# Patient Record
Sex: Female | Born: 1945 | Race: White | Hispanic: No | Marital: Married | Smoking: Never smoker
Health system: Southern US, Community
[De-identification: ages and names within clinical notes are randomized; demographics above are authoritative.]

## PROBLEM LIST (undated history)

## (undated) DIAGNOSIS — Z923 Personal history of irradiation: Secondary | ICD-10-CM

## (undated) DIAGNOSIS — Z853 Personal history of malignant neoplasm of breast: Secondary | ICD-10-CM

## (undated) DIAGNOSIS — M4310 Spondylolisthesis, site unspecified: Secondary | ICD-10-CM

## (undated) DIAGNOSIS — H269 Unspecified cataract: Secondary | ICD-10-CM

## (undated) DIAGNOSIS — R296 Repeated falls: Secondary | ICD-10-CM

## (undated) DIAGNOSIS — E785 Hyperlipidemia, unspecified: Secondary | ICD-10-CM

## (undated) DIAGNOSIS — M8008XS Age-related osteoporosis with current pathological fracture, vertebra(e), sequela: Secondary | ICD-10-CM

## (undated) DIAGNOSIS — N3010 Interstitial cystitis (chronic) without hematuria: Secondary | ICD-10-CM

## (undated) DIAGNOSIS — S0292XA Unspecified fracture of facial bones, initial encounter for closed fracture: Secondary | ICD-10-CM

## (undated) DIAGNOSIS — Z9221 Personal history of antineoplastic chemotherapy: Secondary | ICD-10-CM

## (undated) DIAGNOSIS — D444 Neoplasm of uncertain behavior of craniopharyngeal duct: Secondary | ICD-10-CM

## (undated) DIAGNOSIS — Z8619 Personal history of other infectious and parasitic diseases: Secondary | ICD-10-CM

## (undated) DIAGNOSIS — D496 Neoplasm of unspecified behavior of brain: Secondary | ICD-10-CM

## (undated) DIAGNOSIS — E669 Obesity, unspecified: Secondary | ICD-10-CM

## (undated) DIAGNOSIS — R7309 Other abnormal glucose: Secondary | ICD-10-CM

## (undated) DIAGNOSIS — I428 Other cardiomyopathies: Secondary | ICD-10-CM

## (undated) DIAGNOSIS — N390 Urinary tract infection, site not specified: Secondary | ICD-10-CM

## (undated) DIAGNOSIS — L309 Dermatitis, unspecified: Secondary | ICD-10-CM

## (undated) DIAGNOSIS — Z8719 Personal history of other diseases of the digestive system: Secondary | ICD-10-CM

## (undated) DIAGNOSIS — D649 Anemia, unspecified: Secondary | ICD-10-CM

## (undated) DIAGNOSIS — G5602 Carpal tunnel syndrome, left upper limb: Secondary | ICD-10-CM

## (undated) DIAGNOSIS — C439 Malignant melanoma of skin, unspecified: Secondary | ICD-10-CM

## (undated) DIAGNOSIS — F329 Major depressive disorder, single episode, unspecified: Secondary | ICD-10-CM

## (undated) DIAGNOSIS — I10 Essential (primary) hypertension: Secondary | ICD-10-CM

## (undated) DIAGNOSIS — Z0000 Encounter for general adult medical examination without abnormal findings: Secondary | ICD-10-CM

## (undated) DIAGNOSIS — IMO0002 Reserved for concepts with insufficient information to code with codable children: Secondary | ICD-10-CM

## (undated) DIAGNOSIS — Z9289 Personal history of other medical treatment: Secondary | ICD-10-CM

## (undated) DIAGNOSIS — K5900 Constipation, unspecified: Secondary | ICD-10-CM

## (undated) DIAGNOSIS — E871 Hypo-osmolality and hyponatremia: Secondary | ICD-10-CM

## (undated) DIAGNOSIS — D179 Benign lipomatous neoplasm, unspecified: Secondary | ICD-10-CM

## (undated) DIAGNOSIS — R0602 Shortness of breath: Secondary | ICD-10-CM

## (undated) DIAGNOSIS — K219 Gastro-esophageal reflux disease without esophagitis: Secondary | ICD-10-CM

## (undated) DIAGNOSIS — M1990 Unspecified osteoarthritis, unspecified site: Secondary | ICD-10-CM

## (undated) DIAGNOSIS — G629 Polyneuropathy, unspecified: Secondary | ICD-10-CM

## (undated) DIAGNOSIS — C50919 Malignant neoplasm of unspecified site of unspecified female breast: Secondary | ICD-10-CM

## (undated) HISTORY — DX: Other abnormal glucose: R73.09

## (undated) HISTORY — DX: Interstitial cystitis (chronic) without hematuria: N30.10

## (undated) HISTORY — DX: Reserved for concepts with insufficient information to code with codable children: IMO0002

## (undated) HISTORY — DX: Obesity, unspecified: E66.9

## (undated) HISTORY — PX: OTHER SURGICAL HISTORY: SHX169

## (undated) HISTORY — DX: Hypo-osmolality and hyponatremia: E87.1

## (undated) HISTORY — DX: Major depressive disorder, single episode, unspecified: F32.9

## (undated) HISTORY — DX: Gastro-esophageal reflux disease without esophagitis: K21.9

## (undated) HISTORY — DX: Neoplasm of uncertain behavior of craniopharyngeal duct: D44.4

## (undated) HISTORY — DX: Polyneuropathy, unspecified: G62.9

## (undated) HISTORY — PX: REDUCTION MAMMAPLASTY: SUR839

## (undated) HISTORY — DX: Unspecified fracture of facial bones, initial encounter for closed fracture: S02.92XA

## (undated) HISTORY — DX: Hyperlipidemia, unspecified: E78.5

## (undated) HISTORY — DX: Anemia, unspecified: D64.9

## (undated) HISTORY — DX: Encounter for general adult medical examination without abnormal findings: Z00.00

## (undated) HISTORY — DX: Benign lipomatous neoplasm, unspecified: D17.9

## (undated) HISTORY — DX: Unspecified osteoarthritis, unspecified site: M19.90

## (undated) HISTORY — PX: MELANOMA EXCISION: SHX5266

## (undated) HISTORY — DX: Constipation, unspecified: K59.00

## (undated) HISTORY — DX: Personal history of malignant neoplasm of breast: Z85.3

## (undated) HISTORY — DX: Other cardiomyopathies: I42.8

## (undated) HISTORY — DX: Malignant melanoma of skin, unspecified: C43.9

## (undated) HISTORY — DX: Repeated falls: R29.6

## (undated) HISTORY — DX: Unspecified cataract: H26.9

## (undated) HISTORY — PX: ELBOW SURGERY: SHX618

## (undated) HISTORY — DX: Malignant neoplasm of unspecified site of unspecified female breast: C50.919

## (undated) HISTORY — DX: Spondylolisthesis, site unspecified: M43.10

## (undated) HISTORY — PX: CARPAL TUNNEL RELEASE: SHX101

## (undated) HISTORY — DX: Dermatitis, unspecified: L30.9

## (undated) HISTORY — DX: Urinary tract infection, site not specified: N39.0

## (undated) HISTORY — DX: Age-related osteoporosis with current pathological fracture, vertebra(e), sequela: M80.08XS

## (undated) HISTORY — DX: Personal history of other infectious and parasitic diseases: Z86.19

## (undated) HISTORY — DX: Carpal tunnel syndrome, left upper limb: G56.02

## (undated) HISTORY — DX: Neoplasm of unspecified behavior of brain: D49.6

## (undated) HISTORY — PX: CHOLECYSTECTOMY: SHX55

## (undated) HISTORY — DX: Essential (primary) hypertension: I10

---
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
## 1995-01-31 HISTORY — PX: TUBAL LIGATION: SHX77

## 1998-01-30 DIAGNOSIS — D444 Neoplasm of uncertain behavior of craniopharyngeal duct: Secondary | ICD-10-CM

## 1998-01-30 HISTORY — PX: CRANIOTOMY FOR TUMOR: SUR345

## 1998-01-30 HISTORY — DX: Neoplasm of uncertain behavior of craniopharyngeal duct: D44.4

## 1998-06-17 ENCOUNTER — Ambulatory Visit (HOSPITAL_COMMUNITY): Admission: RE | Admit: 1998-06-17 | Discharge: 1998-06-17 | Payer: Self-pay | Admitting: Ophthalmology

## 1998-06-17 ENCOUNTER — Encounter: Payer: Self-pay | Admitting: Ophthalmology

## 1998-06-21 ENCOUNTER — Encounter: Payer: Self-pay | Admitting: Neurosurgery

## 1998-06-22 ENCOUNTER — Encounter: Payer: Self-pay | Admitting: Neurosurgery

## 1998-06-22 ENCOUNTER — Inpatient Hospital Stay (HOSPITAL_COMMUNITY): Admission: RE | Admit: 1998-06-22 | Discharge: 1998-06-25 | Payer: Self-pay | Admitting: Neurosurgery

## 1998-06-23 ENCOUNTER — Encounter: Payer: Self-pay | Admitting: Neurosurgery

## 1998-06-29 ENCOUNTER — Encounter: Payer: Self-pay | Admitting: Neurosurgery

## 1998-06-29 ENCOUNTER — Ambulatory Visit (HOSPITAL_COMMUNITY): Admission: RE | Admit: 1998-06-29 | Discharge: 1998-06-29 | Payer: Self-pay | Admitting: Neurosurgery

## 1998-08-02 ENCOUNTER — Encounter: Payer: Self-pay | Admitting: Neurosurgery

## 1998-08-02 ENCOUNTER — Ambulatory Visit (HOSPITAL_COMMUNITY): Admission: RE | Admit: 1998-08-02 | Discharge: 1998-08-02 | Payer: Self-pay | Admitting: Neurosurgery

## 1999-02-25 ENCOUNTER — Encounter: Payer: Self-pay | Admitting: Internal Medicine

## 1999-02-25 ENCOUNTER — Ambulatory Visit (HOSPITAL_COMMUNITY): Admission: RE | Admit: 1999-02-25 | Discharge: 1999-02-25 | Payer: Self-pay | Admitting: Internal Medicine

## 1999-04-27 ENCOUNTER — Encounter: Admission: RE | Admit: 1999-04-27 | Discharge: 1999-04-27 | Payer: Self-pay | Admitting: Internal Medicine

## 1999-04-27 ENCOUNTER — Encounter: Payer: Self-pay | Admitting: Internal Medicine

## 1999-10-10 ENCOUNTER — Encounter: Payer: Self-pay | Admitting: Internal Medicine

## 1999-10-10 ENCOUNTER — Ambulatory Visit (HOSPITAL_COMMUNITY): Admission: RE | Admit: 1999-10-10 | Discharge: 1999-10-10 | Payer: Self-pay | Admitting: Internal Medicine

## 1999-11-07 ENCOUNTER — Encounter: Admission: RE | Admit: 1999-11-07 | Discharge: 1999-11-07 | Payer: Self-pay | Admitting: Specialist

## 1999-11-07 ENCOUNTER — Encounter: Payer: Self-pay | Admitting: Specialist

## 2000-02-20 ENCOUNTER — Encounter: Payer: Self-pay | Admitting: Neurosurgery

## 2000-02-20 ENCOUNTER — Ambulatory Visit (HOSPITAL_COMMUNITY): Admission: RE | Admit: 2000-02-20 | Discharge: 2000-02-20 | Payer: Self-pay | Admitting: Neurosurgery

## 2000-03-05 ENCOUNTER — Other Ambulatory Visit: Admission: RE | Admit: 2000-03-05 | Discharge: 2000-03-05 | Payer: Self-pay | Admitting: Obstetrics and Gynecology

## 2000-05-02 ENCOUNTER — Encounter: Payer: Self-pay | Admitting: Obstetrics and Gynecology

## 2000-05-02 ENCOUNTER — Encounter: Admission: RE | Admit: 2000-05-02 | Discharge: 2000-05-02 | Payer: Self-pay | Admitting: Obstetrics and Gynecology

## 2001-01-30 ENCOUNTER — Encounter: Payer: Self-pay | Admitting: *Deleted

## 2001-02-20 ENCOUNTER — Other Ambulatory Visit: Admission: RE | Admit: 2001-02-20 | Discharge: 2001-02-20 | Payer: Self-pay | Admitting: Obstetrics & Gynecology

## 2001-05-06 ENCOUNTER — Encounter: Admission: RE | Admit: 2001-05-06 | Discharge: 2001-05-06 | Payer: Self-pay | Admitting: Obstetrics & Gynecology

## 2001-05-06 ENCOUNTER — Encounter: Payer: Self-pay | Admitting: Obstetrics & Gynecology

## 2002-05-12 ENCOUNTER — Encounter: Payer: Self-pay | Admitting: Family Medicine

## 2002-05-12 ENCOUNTER — Encounter: Admission: RE | Admit: 2002-05-12 | Discharge: 2002-05-12 | Payer: Self-pay | Admitting: Family Medicine

## 2002-05-29 ENCOUNTER — Ambulatory Visit (HOSPITAL_COMMUNITY): Admission: RE | Admit: 2002-05-29 | Discharge: 2002-05-29 | Payer: Self-pay | Admitting: Gastroenterology

## 2003-05-20 ENCOUNTER — Encounter: Admission: RE | Admit: 2003-05-20 | Discharge: 2003-05-20 | Payer: Self-pay | Admitting: Family Medicine

## 2003-06-26 ENCOUNTER — Ambulatory Visit (HOSPITAL_COMMUNITY): Admission: RE | Admit: 2003-06-26 | Discharge: 2003-06-26 | Payer: Self-pay | Admitting: Gastroenterology

## 2003-09-28 ENCOUNTER — Ambulatory Visit (HOSPITAL_COMMUNITY): Admission: RE | Admit: 2003-09-28 | Discharge: 2003-09-28 | Payer: Self-pay | Admitting: Gastroenterology

## 2004-07-12 ENCOUNTER — Encounter: Admission: RE | Admit: 2004-07-12 | Discharge: 2004-07-12 | Payer: Self-pay | Admitting: Family Medicine

## 2005-03-07 ENCOUNTER — Encounter: Admission: RE | Admit: 2005-03-07 | Discharge: 2005-03-07 | Payer: Self-pay | Admitting: Family Medicine

## 2005-08-01 ENCOUNTER — Encounter: Admission: RE | Admit: 2005-08-01 | Discharge: 2005-08-01 | Payer: Self-pay | Admitting: Family Medicine

## 2006-01-30 DIAGNOSIS — C439 Malignant melanoma of skin, unspecified: Secondary | ICD-10-CM

## 2006-01-30 HISTORY — DX: Malignant melanoma of skin, unspecified: C43.9

## 2006-06-20 ENCOUNTER — Other Ambulatory Visit: Admission: RE | Admit: 2006-06-20 | Discharge: 2006-06-20 | Payer: Self-pay | Admitting: Obstetrics and Gynecology

## 2006-08-07 ENCOUNTER — Encounter: Admission: RE | Admit: 2006-08-07 | Discharge: 2006-08-07 | Payer: Self-pay | Admitting: Gynecology

## 2007-07-24 ENCOUNTER — Other Ambulatory Visit: Admission: RE | Admit: 2007-07-24 | Discharge: 2007-07-24 | Payer: Self-pay | Admitting: Obstetrics and Gynecology

## 2007-08-13 ENCOUNTER — Encounter: Admission: RE | Admit: 2007-08-13 | Discharge: 2007-08-13 | Payer: Self-pay | Admitting: Family Medicine

## 2007-09-12 ENCOUNTER — Encounter: Payer: Self-pay | Admitting: Family Medicine

## 2008-08-11 ENCOUNTER — Ambulatory Visit: Payer: Self-pay | Admitting: Family Medicine

## 2008-08-11 ENCOUNTER — Encounter: Payer: Self-pay | Admitting: *Deleted

## 2008-08-11 DIAGNOSIS — F3289 Other specified depressive episodes: Secondary | ICD-10-CM

## 2008-08-11 DIAGNOSIS — K219 Gastro-esophageal reflux disease without esophagitis: Secondary | ICD-10-CM | POA: Insufficient documentation

## 2008-08-11 DIAGNOSIS — F329 Major depressive disorder, single episode, unspecified: Secondary | ICD-10-CM

## 2008-08-11 DIAGNOSIS — I10 Essential (primary) hypertension: Secondary | ICD-10-CM

## 2008-08-11 DIAGNOSIS — E785 Hyperlipidemia, unspecified: Secondary | ICD-10-CM

## 2008-08-11 DIAGNOSIS — E782 Mixed hyperlipidemia: Secondary | ICD-10-CM

## 2008-08-11 DIAGNOSIS — F339 Major depressive disorder, recurrent, unspecified: Secondary | ICD-10-CM

## 2008-08-11 DIAGNOSIS — F418 Other specified anxiety disorders: Secondary | ICD-10-CM | POA: Insufficient documentation

## 2008-08-11 HISTORY — DX: Major depressive disorder, recurrent, unspecified: F33.9

## 2008-08-11 HISTORY — DX: Mixed hyperlipidemia: E78.2

## 2008-08-11 HISTORY — DX: Gastro-esophageal reflux disease without esophagitis: K21.9

## 2008-08-11 HISTORY — DX: Major depressive disorder, single episode, unspecified: F32.9

## 2008-08-11 HISTORY — DX: Essential (primary) hypertension: I10

## 2008-08-11 HISTORY — DX: Hyperlipidemia, unspecified: E78.5

## 2008-08-11 HISTORY — DX: Other specified depressive episodes: F32.89

## 2008-08-12 LAB — CONVERTED CEMR LAB
ALT: 28 units/L (ref 0–35)
Albumin: 3.8 g/dL (ref 3.5–5.2)
Bilirubin, Direct: 0.1 mg/dL (ref 0.0–0.3)
Direct LDL: 131.1 mg/dL
Glucose, Bld: 99 mg/dL (ref 70–99)
Sodium: 143 meq/L (ref 135–145)
Total CHOL/HDL Ratio: 4

## 2008-08-18 ENCOUNTER — Encounter: Admission: RE | Admit: 2008-08-18 | Discharge: 2008-08-18 | Payer: Self-pay | Admitting: Family Medicine

## 2008-08-27 ENCOUNTER — Encounter: Payer: Self-pay | Admitting: Women's Health

## 2008-08-27 ENCOUNTER — Other Ambulatory Visit: Admission: RE | Admit: 2008-08-27 | Discharge: 2008-08-27 | Payer: Self-pay | Admitting: Obstetrics and Gynecology

## 2008-08-27 ENCOUNTER — Ambulatory Visit: Payer: Self-pay | Admitting: Women's Health

## 2008-11-30 ENCOUNTER — Encounter (INDEPENDENT_AMBULATORY_CARE_PROVIDER_SITE_OTHER): Payer: Self-pay | Admitting: *Deleted

## 2008-12-15 ENCOUNTER — Encounter (INDEPENDENT_AMBULATORY_CARE_PROVIDER_SITE_OTHER): Payer: Self-pay | Admitting: *Deleted

## 2009-01-30 DIAGNOSIS — C50311 Malignant neoplasm of lower-inner quadrant of right female breast: Secondary | ICD-10-CM

## 2009-01-30 HISTORY — DX: Malignant neoplasm of lower-inner quadrant of right female breast: C50.311

## 2009-02-10 ENCOUNTER — Ambulatory Visit: Payer: Self-pay | Admitting: Family Medicine

## 2009-02-10 DIAGNOSIS — R0609 Other forms of dyspnea: Secondary | ICD-10-CM | POA: Insufficient documentation

## 2009-02-10 DIAGNOSIS — D179 Benign lipomatous neoplasm, unspecified: Secondary | ICD-10-CM | POA: Insufficient documentation

## 2009-02-10 HISTORY — DX: Benign lipomatous neoplasm, unspecified: D17.9

## 2009-02-10 HISTORY — DX: Other forms of dyspnea: R06.09

## 2009-03-04 ENCOUNTER — Telehealth: Payer: Self-pay | Admitting: Family Medicine

## 2009-03-24 ENCOUNTER — Ambulatory Visit (HOSPITAL_BASED_OUTPATIENT_CLINIC_OR_DEPARTMENT_OTHER): Admission: RE | Admit: 2009-03-24 | Discharge: 2009-03-24 | Payer: Self-pay | Admitting: Surgery

## 2009-03-25 DIAGNOSIS — Z853 Personal history of malignant neoplasm of breast: Secondary | ICD-10-CM | POA: Insufficient documentation

## 2009-03-28 HISTORY — PX: LIPOMA EXCISION: SHX5283

## 2009-04-30 DIAGNOSIS — Z853 Personal history of malignant neoplasm of breast: Secondary | ICD-10-CM

## 2009-04-30 HISTORY — DX: Personal history of malignant neoplasm of breast: Z85.3

## 2009-04-30 HISTORY — PX: BREAST LUMPECTOMY: SHX2

## 2009-05-03 ENCOUNTER — Encounter (INDEPENDENT_AMBULATORY_CARE_PROVIDER_SITE_OTHER): Payer: Self-pay

## 2009-05-04 ENCOUNTER — Ambulatory Visit: Payer: Self-pay | Admitting: Gastroenterology

## 2009-05-04 ENCOUNTER — Encounter: Payer: Self-pay | Admitting: Family Medicine

## 2009-05-04 ENCOUNTER — Telehealth: Payer: Self-pay | Admitting: Gastroenterology

## 2009-05-06 ENCOUNTER — Encounter: Admission: RE | Admit: 2009-05-06 | Discharge: 2009-05-06 | Payer: Self-pay | Admitting: Surgery

## 2009-05-10 ENCOUNTER — Encounter: Payer: Self-pay | Admitting: Family Medicine

## 2009-05-11 ENCOUNTER — Encounter: Admission: RE | Admit: 2009-05-11 | Discharge: 2009-05-11 | Payer: Self-pay | Admitting: Surgery

## 2009-05-13 ENCOUNTER — Ambulatory Visit: Payer: Self-pay | Admitting: Oncology

## 2009-05-17 LAB — CBC WITH DIFFERENTIAL/PLATELET
BASO%: 0.5 % (ref 0.0–2.0)
EOS%: 4.4 % (ref 0.0–7.0)
HCT: 35.5 % (ref 34.8–46.6)
MCH: 28.6 pg (ref 25.1–34.0)
MCHC: 33.6 g/dL (ref 31.5–36.0)
NEUT%: 55.6 % (ref 38.4–76.8)
RDW: 14.9 % — ABNORMAL HIGH (ref 11.2–14.5)
lymph#: 2.1 10*3/uL (ref 0.9–3.3)

## 2009-05-17 LAB — COMPREHENSIVE METABOLIC PANEL
ALT: 44 U/L — ABNORMAL HIGH (ref 0–35)
AST: 38 U/L — ABNORMAL HIGH (ref 0–37)
Calcium: 9.6 mg/dL (ref 8.4–10.5)
Chloride: 106 mEq/L (ref 96–112)
Creatinine, Ser: 0.73 mg/dL (ref 0.40–1.20)
Total Bilirubin: 0.6 mg/dL (ref 0.3–1.2)

## 2009-05-18 ENCOUNTER — Ambulatory Visit: Payer: Self-pay | Admitting: Gastroenterology

## 2009-05-19 ENCOUNTER — Ambulatory Visit: Payer: Self-pay | Admitting: Cardiology

## 2009-05-19 ENCOUNTER — Ambulatory Visit: Admission: RE | Admit: 2009-05-19 | Discharge: 2009-05-19 | Payer: Self-pay | Admitting: Oncology

## 2009-05-19 ENCOUNTER — Encounter: Payer: Self-pay | Admitting: Oncology

## 2009-05-20 ENCOUNTER — Ambulatory Visit (HOSPITAL_BASED_OUTPATIENT_CLINIC_OR_DEPARTMENT_OTHER): Admission: RE | Admit: 2009-05-20 | Discharge: 2009-05-20 | Payer: Self-pay | Admitting: Orthopedic Surgery

## 2009-05-24 ENCOUNTER — Telehealth: Payer: Self-pay | Admitting: Family Medicine

## 2009-05-24 ENCOUNTER — Ambulatory Visit (HOSPITAL_COMMUNITY): Admission: RE | Admit: 2009-05-24 | Discharge: 2009-05-24 | Payer: Self-pay | Admitting: Oncology

## 2009-05-26 ENCOUNTER — Encounter: Payer: Self-pay | Admitting: Family Medicine

## 2009-05-30 HISTORY — PX: PORTACATH PLACEMENT: SHX2246

## 2009-05-31 ENCOUNTER — Ambulatory Visit (HOSPITAL_COMMUNITY): Admission: RE | Admit: 2009-05-31 | Discharge: 2009-05-31 | Payer: Self-pay | Admitting: Surgery

## 2009-05-31 ENCOUNTER — Ambulatory Visit (HOSPITAL_BASED_OUTPATIENT_CLINIC_OR_DEPARTMENT_OTHER): Admission: RE | Admit: 2009-05-31 | Discharge: 2009-05-31 | Payer: Self-pay | Admitting: Surgery

## 2009-06-01 ENCOUNTER — Ambulatory Visit: Payer: Self-pay | Admitting: Oncology

## 2009-06-01 ENCOUNTER — Encounter: Payer: Self-pay | Admitting: Family Medicine

## 2009-06-07 LAB — CBC WITH DIFFERENTIAL/PLATELET
Basophils Absolute: 0 10*3/uL (ref 0.0–0.1)
Eosinophils Absolute: 0.2 10*3/uL (ref 0.0–0.5)
HCT: 35.4 % (ref 34.8–46.6)
HGB: 11.9 g/dL (ref 11.6–15.9)
LYMPH%: 27.7 % (ref 14.0–49.7)
MCV: 84.7 fL (ref 79.5–101.0)
MONO#: 0.5 10*3/uL (ref 0.1–0.9)
MONO%: 15.4 % — ABNORMAL HIGH (ref 0.0–14.0)
NEUT#: 1.6 10*3/uL (ref 1.5–6.5)
NEUT%: 51.1 % (ref 38.4–76.8)
Platelets: 142 10*3/uL — ABNORMAL LOW (ref 145–400)
WBC: 3 10*3/uL — ABNORMAL LOW (ref 3.9–10.3)

## 2009-06-09 ENCOUNTER — Ambulatory Visit: Payer: Self-pay | Admitting: Family Medicine

## 2009-06-09 LAB — CONVERTED CEMR LAB
AST: 44 units/L — ABNORMAL HIGH (ref 0–37)
BUN: 12 mg/dL (ref 6–23)
Calcium: 9.6 mg/dL (ref 8.4–10.5)
Chloride: 101 meq/L (ref 96–112)
Creatinine, Ser: 1.1 mg/dL (ref 0.4–1.2)
GFR calc non Af Amer: 55.9 mL/min (ref >60)
Glucose, Bld: 121 mg/dL — ABNORMAL HIGH (ref 70–99)
HDL: 38.6 mg/dL — ABNORMAL LOW (ref >39.00)
LDL Cholesterol: 62 mg/dL (ref 0–99)
Sodium: 140 meq/L (ref 135–145)
Total CHOL/HDL Ratio: 3
Triglycerides: 164 mg/dL — ABNORMAL HIGH (ref 0.0–149.0)

## 2009-06-14 ENCOUNTER — Encounter: Payer: Self-pay | Admitting: Family Medicine

## 2009-06-14 LAB — CBC WITH DIFFERENTIAL/PLATELET
BASO%: 0.3 % (ref 0.0–2.0)
Basophils Absolute: 0 10*3/uL (ref 0.0–0.1)
HCT: 31.5 % — ABNORMAL LOW (ref 34.8–46.6)
LYMPH%: 16.5 % (ref 14.0–49.7)
MCHC: 33.9 g/dL (ref 31.5–36.0)
MCV: 83.4 fL (ref 79.5–101.0)
MONO#: 0.4 10*3/uL (ref 0.1–0.9)
MONO%: 5 % (ref 0.0–14.0)
NEUT%: 76.6 % (ref 38.4–76.8)
Platelets: 138 10*3/uL — ABNORMAL LOW (ref 145–400)
RBC: 3.77 10*6/uL (ref 3.70–5.45)
WBC: 7.8 10*3/uL (ref 3.9–10.3)

## 2009-06-14 LAB — COMPREHENSIVE METABOLIC PANEL
ALT: 50 U/L — ABNORMAL HIGH (ref 0–35)
Alkaline Phosphatase: 149 U/L — ABNORMAL HIGH (ref 39–117)
CO2: 22 mEq/L (ref 19–32)
Creatinine, Ser: 0.82 mg/dL (ref 0.40–1.20)
Glucose, Bld: 106 mg/dL — ABNORMAL HIGH (ref 70–99)
Total Bilirubin: 0.4 mg/dL (ref 0.3–1.2)

## 2009-06-15 ENCOUNTER — Ambulatory Visit: Payer: Self-pay | Admitting: Family Medicine

## 2009-06-15 DIAGNOSIS — R7309 Other abnormal glucose: Secondary | ICD-10-CM

## 2009-06-15 DIAGNOSIS — R7303 Prediabetes: Secondary | ICD-10-CM

## 2009-06-15 HISTORY — DX: Other abnormal glucose: R73.09

## 2009-06-15 HISTORY — DX: Prediabetes: R73.03

## 2009-06-15 LAB — CONVERTED CEMR LAB
HDL goal, serum: 40 mg/dL
LDL Goal: 130 mg/dL

## 2009-06-22 ENCOUNTER — Encounter: Payer: Self-pay | Admitting: Family Medicine

## 2009-06-22 LAB — CBC WITH DIFFERENTIAL/PLATELET
Eosinophils Absolute: 0 10*3/uL (ref 0.0–0.5)
HCT: 29.4 % — ABNORMAL LOW (ref 34.8–46.6)
LYMPH%: 7.4 % — ABNORMAL LOW (ref 14.0–49.7)
MCV: 85.5 fL (ref 79.5–101.0)
MONO#: 0.4 10*3/uL (ref 0.1–0.9)
NEUT#: 13.5 10*3/uL — ABNORMAL HIGH (ref 1.5–6.5)
NEUT%: 90 % — ABNORMAL HIGH (ref 38.4–76.8)
Platelets: 415 10*3/uL — ABNORMAL HIGH (ref 145–400)
WBC: 15 10*3/uL — ABNORMAL HIGH (ref 3.9–10.3)

## 2009-06-25 ENCOUNTER — Encounter: Payer: Self-pay | Admitting: Family Medicine

## 2009-06-29 ENCOUNTER — Encounter: Payer: Self-pay | Admitting: Family Medicine

## 2009-06-29 LAB — CBC WITH DIFFERENTIAL/PLATELET
BASO%: 0.5 % (ref 0.0–2.0)
EOS%: 8.1 % — ABNORMAL HIGH (ref 0.0–7.0)
HCT: 33.6 % — ABNORMAL LOW (ref 34.8–46.6)
LYMPH%: 21.9 % (ref 14.0–49.7)
MCH: 27.5 pg (ref 25.1–34.0)
MCHC: 32.4 g/dL (ref 31.5–36.0)
MCV: 84.8 fL (ref 79.5–101.0)
MONO%: 24.6 % — ABNORMAL HIGH (ref 0.0–14.0)
NEUT%: 44.9 % (ref 38.4–76.8)
Platelets: 185 10*3/uL (ref 145–400)

## 2009-07-02 ENCOUNTER — Ambulatory Visit: Payer: Self-pay | Admitting: Oncology

## 2009-07-06 ENCOUNTER — Encounter: Payer: Self-pay | Admitting: Family Medicine

## 2009-07-06 LAB — CBC WITH DIFFERENTIAL/PLATELET
BASO%: 1 % (ref 0.0–2.0)
Basophils Absolute: 0 10*3/uL (ref 0.0–0.1)
EOS%: 0.7 % (ref 0.0–7.0)
HGB: 9.5 g/dL — ABNORMAL LOW (ref 11.6–15.9)
MCH: 29.4 pg (ref 25.1–34.0)
MCHC: 34.7 g/dL (ref 31.5–36.0)
MCV: 84.8 fL (ref 79.5–101.0)
MONO%: 6.6 % (ref 0.0–14.0)
NEUT%: 68.4 % (ref 38.4–76.8)
RDW: 16.5 % — ABNORMAL HIGH (ref 11.2–14.5)
lymph#: 1.2 10*3/uL (ref 0.9–3.3)

## 2009-07-06 LAB — COMPREHENSIVE METABOLIC PANEL
ALT: 39 U/L — ABNORMAL HIGH (ref 0–35)
AST: 31 U/L (ref 0–37)
Alkaline Phosphatase: 103 U/L (ref 39–117)
BUN: 9 mg/dL (ref 6–23)
Calcium: 8.8 mg/dL (ref 8.4–10.5)
Chloride: 107 mEq/L (ref 96–112)
Creatinine, Ser: 0.74 mg/dL (ref 0.40–1.20)
Potassium: 3.8 mEq/L (ref 3.5–5.3)

## 2009-07-13 ENCOUNTER — Encounter: Payer: Self-pay | Admitting: Family Medicine

## 2009-07-13 LAB — CBC WITH DIFFERENTIAL/PLATELET
BASO%: 1 % (ref 0.0–2.0)
Basophils Absolute: 0.1 10*3/uL (ref 0.0–0.1)
EOS%: 2.1 % (ref 0.0–7.0)
HCT: 29 % — ABNORMAL LOW (ref 34.8–46.6)
HGB: 9.2 g/dL — ABNORMAL LOW (ref 11.6–15.9)
LYMPH%: 24.3 % (ref 14.0–49.7)
MCH: 28.4 pg (ref 25.1–34.0)
MCHC: 31.7 g/dL (ref 31.5–36.0)
MCV: 89.5 fL (ref 79.5–101.0)
MONO%: 9.9 % (ref 0.0–14.0)
NEUT%: 62.7 % (ref 38.4–76.8)

## 2009-07-13 LAB — COMPREHENSIVE METABOLIC PANEL
AST: 38 U/L — ABNORMAL HIGH (ref 0–37)
Albumin: 4 g/dL (ref 3.5–5.2)
Alkaline Phosphatase: 91 U/L (ref 39–117)
BUN: 8 mg/dL (ref 6–23)
Creatinine, Ser: 0.77 mg/dL (ref 0.40–1.20)
Glucose, Bld: 87 mg/dL (ref 70–99)
Total Bilirubin: 0.3 mg/dL (ref 0.3–1.2)

## 2009-07-20 ENCOUNTER — Encounter: Payer: Self-pay | Admitting: Family Medicine

## 2009-07-20 LAB — COMPREHENSIVE METABOLIC PANEL
AST: 27 U/L (ref 0–37)
Albumin: 4.2 g/dL (ref 3.5–5.2)
BUN: 13 mg/dL (ref 6–23)
CO2: 22 mEq/L (ref 19–32)
Calcium: 9.2 mg/dL (ref 8.4–10.5)
Chloride: 107 mEq/L (ref 96–112)
Glucose, Bld: 104 mg/dL — ABNORMAL HIGH (ref 70–99)
Potassium: 4 mEq/L (ref 3.5–5.3)

## 2009-07-20 LAB — CBC WITH DIFFERENTIAL/PLATELET
BASO%: 0.4 % (ref 0.0–2.0)
Basophils Absolute: 0 10*3/uL (ref 0.0–0.1)
EOS%: 4.1 % (ref 0.0–7.0)
HGB: 9.4 g/dL — ABNORMAL LOW (ref 11.6–15.9)
MCH: 29.9 pg (ref 25.1–34.0)
MCHC: 34 g/dL (ref 31.5–36.0)
MONO#: 0.4 10*3/uL (ref 0.1–0.9)
RDW: 21.7 % — ABNORMAL HIGH (ref 11.2–14.5)
WBC: 6.5 10*3/uL (ref 3.9–10.3)
lymph#: 1.6 10*3/uL (ref 0.9–3.3)

## 2009-07-27 ENCOUNTER — Encounter: Payer: Self-pay | Admitting: Family Medicine

## 2009-07-27 LAB — COMPREHENSIVE METABOLIC PANEL
Albumin: 4.1 g/dL (ref 3.5–5.2)
BUN: 10 mg/dL (ref 6–23)
CO2: 21 mEq/L (ref 19–32)
Calcium: 8.8 mg/dL (ref 8.4–10.5)
Chloride: 104 mEq/L (ref 96–112)
Glucose, Bld: 106 mg/dL — ABNORMAL HIGH (ref 70–99)
Potassium: 4 mEq/L (ref 3.5–5.3)

## 2009-07-27 LAB — CBC WITH DIFFERENTIAL/PLATELET
Basophils Absolute: 0 10*3/uL (ref 0.0–0.1)
Eosinophils Absolute: 0.2 10*3/uL (ref 0.0–0.5)
HGB: 10.2 g/dL — ABNORMAL LOW (ref 11.6–15.9)
NEUT#: 3.2 10*3/uL (ref 1.5–6.5)
RDW: 20.3 % — ABNORMAL HIGH (ref 11.2–14.5)
lymph#: 1.4 10*3/uL (ref 0.9–3.3)

## 2009-08-03 ENCOUNTER — Ambulatory Visit: Payer: Self-pay | Admitting: Oncology

## 2009-08-03 ENCOUNTER — Encounter: Payer: Self-pay | Admitting: Family Medicine

## 2009-08-03 LAB — CBC WITH DIFFERENTIAL/PLATELET
BASO%: 0.7 % (ref 0.0–2.0)
MCHC: 32.3 g/dL (ref 31.5–36.0)
MONO#: 0.4 10*3/uL (ref 0.1–0.9)
RBC: 3.51 10*6/uL — ABNORMAL LOW (ref 3.70–5.45)
RDW: 20.1 % — ABNORMAL HIGH (ref 11.2–14.5)
WBC: 5.9 10*3/uL (ref 3.9–10.3)
lymph#: 1.3 10*3/uL (ref 0.9–3.3)

## 2009-08-03 LAB — COMPREHENSIVE METABOLIC PANEL
ALT: 40 U/L — ABNORMAL HIGH (ref 0–35)
CO2: 20 mEq/L (ref 19–32)
Calcium: 9.1 mg/dL (ref 8.4–10.5)
Chloride: 101 mEq/L (ref 96–112)
Sodium: 136 mEq/L (ref 135–145)
Total Protein: 6.6 g/dL (ref 6.0–8.3)

## 2009-08-10 ENCOUNTER — Encounter: Payer: Self-pay | Admitting: Family Medicine

## 2009-08-10 LAB — CBC WITH DIFFERENTIAL/PLATELET
Basophils Absolute: 0 10*3/uL (ref 0.0–0.1)
Eosinophils Absolute: 0.3 10*3/uL (ref 0.0–0.5)
HCT: 28.8 % — ABNORMAL LOW (ref 34.8–46.6)
LYMPH%: 26.8 % (ref 14.0–49.7)
MCV: 93.5 fL (ref 79.5–101.0)
MONO#: 0.6 10*3/uL (ref 0.1–0.9)
MONO%: 11.5 % (ref 0.0–14.0)
NEUT#: 2.8 10*3/uL (ref 1.5–6.5)
NEUT%: 55.7 % (ref 38.4–76.8)
Platelets: 214 10*3/uL (ref 145–400)
RBC: 3.08 10*6/uL — ABNORMAL LOW (ref 3.70–5.45)
nRBC: 0 % (ref 0–0)

## 2009-08-10 LAB — COMPREHENSIVE METABOLIC PANEL
Alkaline Phosphatase: 91 U/L (ref 39–117)
Creatinine, Ser: 0.78 mg/dL (ref 0.40–1.20)
Glucose, Bld: 93 mg/dL (ref 70–99)
Sodium: 144 mEq/L (ref 135–145)
Total Bilirubin: 0.3 mg/dL (ref 0.3–1.2)
Total Protein: 6.1 g/dL (ref 6.0–8.3)

## 2009-08-12 ENCOUNTER — Ambulatory Visit: Payer: Self-pay | Admitting: Surgery

## 2009-08-12 ENCOUNTER — Encounter: Payer: Self-pay | Admitting: Oncology

## 2009-08-12 ENCOUNTER — Ambulatory Visit: Admission: RE | Admit: 2009-08-12 | Discharge: 2009-08-12 | Payer: Self-pay | Admitting: Oncology

## 2009-08-12 ENCOUNTER — Ambulatory Visit
Admission: RE | Admit: 2009-08-12 | Discharge: 2009-08-12 | Payer: Self-pay | Source: Home / Self Care | Admitting: Oncology

## 2009-08-16 ENCOUNTER — Encounter: Payer: Self-pay | Admitting: Family Medicine

## 2009-08-16 LAB — CBC WITH DIFFERENTIAL/PLATELET
BASO%: 0.7 % (ref 0.0–2.0)
Basophils Absolute: 0 10*3/uL (ref 0.0–0.1)
EOS%: 5.4 % (ref 0.0–7.0)
Eosinophils Absolute: 0.3 10*3/uL (ref 0.0–0.5)
HCT: 31.6 % — ABNORMAL LOW (ref 34.8–46.6)
HGB: 10 g/dL — ABNORMAL LOW (ref 11.6–15.9)
LYMPH%: 26.7 % (ref 14.0–49.7)
MCH: 29.8 pg (ref 25.1–34.0)
MCHC: 31.6 g/dL (ref 31.5–36.0)
MCV: 94 fL (ref 79.5–101.0)
MONO#: 0.5 10*3/uL (ref 0.1–0.9)
MONO%: 8.6 % (ref 0.0–14.0)
NEUT#: 3.6 10*3/uL (ref 1.5–6.5)
NEUT%: 58.6 % (ref 38.4–76.8)
Platelets: 181 10*3/uL (ref 145–400)
RBC: 3.36 10*6/uL — ABNORMAL LOW (ref 3.70–5.45)
RDW: 19.5 % — ABNORMAL HIGH (ref 11.2–14.5)
WBC: 6.1 10*3/uL (ref 3.9–10.3)
lymph#: 1.6 10*3/uL (ref 0.9–3.3)

## 2009-08-23 ENCOUNTER — Encounter: Payer: Self-pay | Admitting: Family Medicine

## 2009-09-01 ENCOUNTER — Encounter: Payer: Self-pay | Admitting: Family Medicine

## 2009-09-01 LAB — CBC WITH DIFFERENTIAL/PLATELET
Basophils Absolute: 0.1 10*3/uL (ref 0.0–0.1)
EOS%: 0.4 % (ref 0.0–7.0)
Eosinophils Absolute: 0 10*3/uL (ref 0.0–0.5)
HCT: 27.8 % — ABNORMAL LOW (ref 34.8–46.6)
HGB: 8.8 g/dL — ABNORMAL LOW (ref 11.6–15.9)
MCH: 30.2 pg (ref 25.1–34.0)
MCV: 95.5 fL (ref 79.5–101.0)
MONO%: 15.3 % — ABNORMAL HIGH (ref 0.0–14.0)
NEUT#: 4.9 10*3/uL (ref 1.5–6.5)
NEUT%: 64.7 % (ref 38.4–76.8)
RDW: 16.9 % — ABNORMAL HIGH (ref 11.2–14.5)
lymph#: 1.4 10*3/uL (ref 0.9–3.3)

## 2009-09-03 ENCOUNTER — Ambulatory Visit: Payer: Self-pay | Admitting: Oncology

## 2009-09-07 ENCOUNTER — Encounter: Payer: Self-pay | Admitting: Family Medicine

## 2009-09-07 LAB — CBC WITH DIFFERENTIAL/PLATELET
Eosinophils Absolute: 0 10*3/uL (ref 0.0–0.5)
HCT: 26 % — ABNORMAL LOW (ref 34.8–46.6)
LYMPH%: 16.9 % (ref 14.0–49.7)
MCV: 93.6 fL (ref 79.5–101.0)
MONO#: 0 10*3/uL — ABNORMAL LOW (ref 0.1–0.9)
MONO%: 0.4 % (ref 0.0–14.0)
NEUT#: 2.6 10*3/uL (ref 1.5–6.5)
NEUT%: 82.3 % — ABNORMAL HIGH (ref 38.4–76.8)
Platelets: 252 10*3/uL (ref 145–400)
RBC: 2.78 10*6/uL — ABNORMAL LOW (ref 3.70–5.45)
WBC: 3.1 10*3/uL — ABNORMAL LOW (ref 3.9–10.3)

## 2009-09-07 LAB — HOLD TUBE, BLOOD BANK

## 2009-09-10 ENCOUNTER — Ambulatory Visit (HOSPITAL_COMMUNITY): Admission: RE | Admit: 2009-09-10 | Discharge: 2009-09-10 | Payer: Self-pay | Admitting: Oncology

## 2009-09-10 ENCOUNTER — Encounter: Payer: Self-pay | Admitting: Family Medicine

## 2009-09-10 LAB — CBC WITH DIFFERENTIAL/PLATELET
BASO%: 8 % — ABNORMAL HIGH (ref 0.0–2.0)
Basophils Absolute: 0 10*3/uL (ref 0.0–0.1)
EOS%: 0 % (ref 0.0–7.0)
HCT: 24.5 % — ABNORMAL LOW (ref 34.8–46.6)
HGB: 7.9 g/dL — ABNORMAL LOW (ref 11.6–15.9)
MCH: 30 pg (ref 25.1–34.0)
MONO#: 0.1 10*3/uL (ref 0.1–0.9)
NEUT%: 6 % — ABNORMAL LOW (ref 38.4–76.8)
RDW: 16.2 % — ABNORMAL HIGH (ref 11.2–14.5)
WBC: 0.5 10*3/uL — CL (ref 3.9–10.3)
lymph#: 0.3 10*3/uL — ABNORMAL LOW (ref 0.9–3.3)

## 2009-09-14 ENCOUNTER — Encounter: Payer: Self-pay | Admitting: Family Medicine

## 2009-09-14 LAB — CBC WITH DIFFERENTIAL/PLATELET
Basophils Absolute: 0.1 10*3/uL (ref 0.0–0.1)
Eosinophils Absolute: 0 10*3/uL (ref 0.0–0.5)
HGB: 10.6 g/dL — ABNORMAL LOW (ref 11.6–15.9)
MONO#: 1.9 10*3/uL — ABNORMAL HIGH (ref 0.1–0.9)
NEUT#: 25.7 10*3/uL — ABNORMAL HIGH (ref 1.5–6.5)
Platelets: 202 10*3/uL (ref 145–400)
RBC: 3.46 10*6/uL — ABNORMAL LOW (ref 3.70–5.45)
RDW: 16.9 % — ABNORMAL HIGH (ref 11.2–14.5)
WBC: 29.2 10*3/uL — ABNORMAL HIGH (ref 3.9–10.3)

## 2009-09-16 ENCOUNTER — Encounter: Admission: RE | Admit: 2009-09-16 | Discharge: 2009-09-16 | Payer: Self-pay | Admitting: Oncology

## 2009-09-20 ENCOUNTER — Ambulatory Visit: Payer: Self-pay | Admitting: Genetic Counselor

## 2009-09-21 ENCOUNTER — Encounter: Payer: Self-pay | Admitting: Family Medicine

## 2009-09-21 LAB — COMPREHENSIVE METABOLIC PANEL
BUN: 15 mg/dL (ref 6–23)
CO2: 28 mEq/L (ref 19–32)
Creatinine, Ser: 0.81 mg/dL (ref 0.40–1.20)
Glucose, Bld: 92 mg/dL (ref 70–99)
Total Bilirubin: 0.2 mg/dL — ABNORMAL LOW (ref 0.3–1.2)
Total Protein: 5.4 g/dL — ABNORMAL LOW (ref 6.0–8.3)

## 2009-09-21 LAB — CBC WITH DIFFERENTIAL/PLATELET
Basophils Absolute: 0.1 10*3/uL (ref 0.0–0.1)
Eosinophils Absolute: 0 10*3/uL (ref 0.0–0.5)
HCT: 28.6 % — ABNORMAL LOW (ref 34.8–46.6)
LYMPH%: 6.9 % — ABNORMAL LOW (ref 14.0–49.7)
MCV: 93.2 fL (ref 79.5–101.0)
MONO#: 1.6 10*3/uL — ABNORMAL HIGH (ref 0.1–0.9)
NEUT#: 10.4 10*3/uL — ABNORMAL HIGH (ref 1.5–6.5)
NEUT%: 80.2 % — ABNORMAL HIGH (ref 38.4–76.8)
Platelets: 322 10*3/uL (ref 145–400)
WBC: 13 10*3/uL — ABNORMAL HIGH (ref 3.9–10.3)

## 2009-09-23 ENCOUNTER — Encounter: Payer: Self-pay | Admitting: Family Medicine

## 2009-09-29 ENCOUNTER — Encounter: Payer: Self-pay | Admitting: Family Medicine

## 2009-09-29 LAB — CBC WITH DIFFERENTIAL/PLATELET
Eosinophils Absolute: 0.1 10*3/uL (ref 0.0–0.5)
HGB: 9.9 g/dL — ABNORMAL LOW (ref 11.6–15.9)
LYMPH%: 11.7 % — ABNORMAL LOW (ref 14.0–49.7)
MONO#: 1 10*3/uL — ABNORMAL HIGH (ref 0.1–0.9)
NEUT#: 6 10*3/uL (ref 1.5–6.5)
Platelets: 426 10*3/uL — ABNORMAL HIGH (ref 145–400)
RBC: 3.18 10*6/uL — ABNORMAL LOW (ref 3.70–5.45)
WBC: 8 10*3/uL (ref 3.9–10.3)

## 2009-09-29 LAB — COMPREHENSIVE METABOLIC PANEL
Albumin: 4 g/dL (ref 3.5–5.2)
CO2: 23 mEq/L (ref 19–32)
Glucose, Bld: 95 mg/dL (ref 70–99)
Potassium: 4.1 mEq/L (ref 3.5–5.3)
Sodium: 144 mEq/L (ref 135–145)
Total Bilirubin: 0.3 mg/dL (ref 0.3–1.2)
Total Protein: 6.1 g/dL (ref 6.0–8.3)

## 2009-09-29 LAB — CANCER ANTIGEN 27.29: CA 27.29: 28 U/mL (ref 0–39)

## 2009-10-07 ENCOUNTER — Encounter: Admission: RE | Admit: 2009-10-07 | Discharge: 2009-10-07 | Payer: Self-pay | Admitting: Surgery

## 2009-10-07 ENCOUNTER — Ambulatory Visit (HOSPITAL_BASED_OUTPATIENT_CLINIC_OR_DEPARTMENT_OTHER): Admission: RE | Admit: 2009-10-07 | Discharge: 2009-10-07 | Payer: Self-pay | Admitting: Surgery

## 2009-10-19 ENCOUNTER — Ambulatory Visit: Payer: Self-pay | Admitting: Oncology

## 2009-10-19 LAB — CBC WITH DIFFERENTIAL/PLATELET
BASO%: 0.9 % (ref 0.0–2.0)
Eosinophils Absolute: 4.3 10*3/uL — ABNORMAL HIGH (ref 0.0–0.5)
HCT: 33.9 % — ABNORMAL LOW (ref 34.8–46.6)
HGB: 10.9 g/dL — ABNORMAL LOW (ref 11.6–15.9)
LYMPH%: 13.7 % — ABNORMAL LOW (ref 14.0–49.7)
MONO#: 0.7 10*3/uL (ref 0.1–0.9)
NEUT#: 5.8 10*3/uL (ref 1.5–6.5)
NEUT%: 45.6 % (ref 38.4–76.8)
Platelets: 251 10*3/uL (ref 145–400)
WBC: 12.7 10*3/uL — ABNORMAL HIGH (ref 3.9–10.3)
lymph#: 1.7 10*3/uL (ref 0.9–3.3)

## 2009-10-21 ENCOUNTER — Ambulatory Visit
Admission: RE | Admit: 2009-10-21 | Discharge: 2009-12-27 | Payer: Self-pay | Source: Home / Self Care | Admitting: Radiation Oncology

## 2009-10-25 ENCOUNTER — Encounter: Payer: Self-pay | Admitting: Family Medicine

## 2009-11-08 ENCOUNTER — Encounter: Payer: Self-pay | Admitting: Family Medicine

## 2009-11-08 LAB — CBC WITH DIFFERENTIAL/PLATELET
BASO%: 0.2 % (ref 0.0–2.0)
MCH: 32.1 pg (ref 25.1–34.0)
MCV: 91.9 fL (ref 79.5–101.0)
NEUT#: 3.8 10*3/uL (ref 1.5–6.5)
RBC: 3.25 10*6/uL — ABNORMAL LOW (ref 3.70–5.45)
RDW: 15.4 % — ABNORMAL HIGH (ref 11.2–14.5)
lymph#: 1.3 10*3/uL (ref 0.9–3.3)

## 2009-11-08 LAB — COMPREHENSIVE METABOLIC PANEL
BUN: 12 mg/dL (ref 6–23)
CO2: 27 mEq/L (ref 19–32)
Calcium: 9.4 mg/dL (ref 8.4–10.5)
Chloride: 105 mEq/L (ref 96–112)
Creatinine, Ser: 0.93 mg/dL (ref 0.40–1.20)
Glucose, Bld: 94 mg/dL (ref 70–99)
Total Bilirubin: 0.3 mg/dL (ref 0.3–1.2)

## 2009-11-16 ENCOUNTER — Ambulatory Visit: Admission: RE | Admit: 2009-11-16 | Discharge: 2009-11-16 | Payer: Self-pay | Admitting: Oncology

## 2009-11-16 ENCOUNTER — Encounter: Payer: Self-pay | Admitting: Oncology

## 2009-11-30 ENCOUNTER — Ambulatory Visit: Payer: Self-pay | Admitting: Oncology

## 2009-11-30 LAB — CBC WITH DIFFERENTIAL/PLATELET
Basophils Absolute: 0 10*3/uL (ref 0.0–0.1)
Eosinophils Absolute: 0.5 10*3/uL (ref 0.0–0.5)
HGB: 11.4 g/dL — ABNORMAL LOW (ref 11.6–15.9)
LYMPH%: 15.9 % (ref 14.0–49.7)
MCV: 90.1 fL (ref 79.5–101.0)
MONO#: 0.5 10*3/uL (ref 0.1–0.9)
MONO%: 8.6 % (ref 0.0–14.0)
NEUT#: 3.8 10*3/uL (ref 1.5–6.5)
Platelets: 256 10*3/uL (ref 145–400)
RDW: 13.9 % (ref 11.2–14.5)

## 2009-12-15 ENCOUNTER — Encounter: Payer: Self-pay | Admitting: Family Medicine

## 2009-12-21 LAB — CBC WITH DIFFERENTIAL/PLATELET
BASO%: 0.6 % (ref 0.0–2.0)
EOS%: 9.8 % — ABNORMAL HIGH (ref 0.0–7.0)
MCH: 29.7 pg (ref 25.1–34.0)
MCV: 89.7 fL (ref 79.5–101.0)
MONO%: 10.6 % (ref 0.0–14.0)
RBC: 3.87 10*6/uL (ref 3.70–5.45)
RDW: 13.6 % (ref 11.2–14.5)
nRBC: 0 % (ref 0–0)

## 2009-12-27 ENCOUNTER — Encounter: Payer: Self-pay | Admitting: Family Medicine

## 2010-01-06 ENCOUNTER — Ambulatory Visit: Payer: Self-pay | Admitting: Oncology

## 2010-01-07 ENCOUNTER — Ambulatory Visit: Payer: Self-pay | Admitting: Women's Health

## 2010-01-07 ENCOUNTER — Other Ambulatory Visit
Admission: RE | Admit: 2010-01-07 | Discharge: 2010-01-07 | Payer: Self-pay | Source: Home / Self Care | Admitting: Obstetrics and Gynecology

## 2010-01-10 ENCOUNTER — Encounter: Payer: Self-pay | Admitting: Family Medicine

## 2010-01-10 LAB — COMPREHENSIVE METABOLIC PANEL
BUN: 15 mg/dL (ref 6–23)
CO2: 26 mEq/L (ref 19–32)
Calcium: 9.1 mg/dL (ref 8.4–10.5)
Chloride: 106 mEq/L (ref 96–112)
Creatinine, Ser: 0.87 mg/dL (ref 0.40–1.20)
Glucose, Bld: 114 mg/dL — ABNORMAL HIGH (ref 70–99)

## 2010-01-10 LAB — CBC WITH DIFFERENTIAL/PLATELET
Basophils Absolute: 0 10*3/uL (ref 0.0–0.1)
HCT: 36.6 % (ref 34.8–46.6)
HGB: 12.1 g/dL (ref 11.6–15.9)
MONO#: 0.6 10*3/uL (ref 0.1–0.9)
NEUT#: 4.3 10*3/uL (ref 1.5–6.5)
NEUT%: 67.7 % (ref 38.4–76.8)
WBC: 6.3 10*3/uL (ref 3.9–10.3)
lymph#: 0.9 10*3/uL (ref 0.9–3.3)

## 2010-01-30 DIAGNOSIS — Z9221 Personal history of antineoplastic chemotherapy: Secondary | ICD-10-CM

## 2010-01-30 DIAGNOSIS — Z923 Personal history of irradiation: Secondary | ICD-10-CM

## 2010-01-30 HISTORY — DX: Personal history of irradiation: Z92.3

## 2010-01-30 HISTORY — DX: Personal history of antineoplastic chemotherapy: Z92.21

## 2010-02-01 LAB — CBC WITH DIFFERENTIAL/PLATELET
Basophils Absolute: 0 10*3/uL (ref 0.0–0.1)
EOS%: 5.7 % (ref 0.0–7.0)
HCT: 34.8 % (ref 34.8–46.6)
LYMPH%: 14.7 % (ref 14.0–49.7)
MCH: 28.9 pg (ref 25.1–34.0)
MCHC: 32.8 g/dL (ref 31.5–36.0)
MONO#: 0.6 10*3/uL (ref 0.1–0.9)
MONO%: 8 % (ref 0.0–14.0)
Platelets: 227 10*3/uL (ref 145–400)
RDW: 13.9 % (ref 11.2–14.5)
lymph#: 1.1 10*3/uL (ref 0.9–3.3)
nRBC: 0 % (ref 0–0)

## 2010-02-09 ENCOUNTER — Encounter: Payer: Self-pay | Admitting: Oncology

## 2010-02-09 ENCOUNTER — Ambulatory Visit
Admission: RE | Admit: 2010-02-09 | Discharge: 2010-02-09 | Payer: Self-pay | Source: Home / Self Care | Attending: Oncology | Admitting: Oncology

## 2010-02-18 ENCOUNTER — Ambulatory Visit: Payer: Self-pay | Admitting: Oncology

## 2010-02-20 ENCOUNTER — Encounter: Payer: Self-pay | Admitting: Family Medicine

## 2010-02-22 LAB — CBC WITH DIFFERENTIAL/PLATELET
EOS%: 6 % (ref 0.0–7.0)
HGB: 10.7 g/dL — ABNORMAL LOW (ref 11.6–15.9)
MCH: 29.7 pg (ref 25.1–34.0)
MCHC: 33.5 g/dL (ref 31.5–36.0)
Platelets: 252 10*3/uL (ref 145–400)
RDW: 14.8 % — ABNORMAL HIGH (ref 11.2–14.5)
WBC: 5.3 10*3/uL (ref 3.9–10.3)
lymph#: 0.9 10*3/uL (ref 0.9–3.3)

## 2010-02-22 LAB — COMPREHENSIVE METABOLIC PANEL
ALT: 35 U/L (ref 0–35)
AST: 34 U/L (ref 0–37)
Albumin: 4.2 g/dL (ref 3.5–5.2)
Alkaline Phosphatase: 124 U/L — ABNORMAL HIGH (ref 39–117)
BUN: 15 mg/dL (ref 6–23)
Calcium: 9.6 mg/dL (ref 8.4–10.5)
Potassium: 4 mEq/L (ref 3.5–5.3)

## 2010-03-03 NOTE — Letter (Signed)
Summary: [facility] Instructions  [facility]  [address] [place], [place] [address]   Phone: [phone]  Fax: [phone]       Shannon [name]    08-02-1945    MRN: [id number]        Procedure Day /Date:  05/18/09  Tuesday     Arrival Time: 7:30am     Procedure Time:  8:30am     Location of Procedure:                    _x _  [facility] (4th Floor)                        PREPARATION FOR COLONOSCOPY WITH MOVIPREP   Starting 5 days prior to your procedure _ 4/14/11_ do not eat nuts, seeds, popcorn, corn, beans, peas,  salads, or any raw vegetables.  Do not take any fiber supplements (e.g. Metamucil, Citrucel, and Benefiber).  THE DAY BEFORE YOUR PROCEDURE         DATE:   05/17/09  DAY:  Monday  1.  Drink clear liquids the entire day-NO SOLID FOOD  2.  Do not drink anything colored red or purple.  Avoid juices with pulp.  No orange juice.  3.  Drink at least 64 oz. (8 glasses) of fluid/clear liquids during the day to prevent dehydration and help the prep work efficiently.  CLEAR LIQUIDS INCLUDE: Water Jello Ice Popsicles Tea (sugar ok, no milk/cream) Powdered fruit flavored drinks Coffee (sugar ok, no milk/cream) Gatorade Juice: apple, white grape, white cranberry  Lemonade Clear bullion, consomm, broth Carbonated beverages (any kind) Strained chicken noodle soup Hard Candy                             4.  In the morning, mix first dose of MoviPrep solution:    Empty 1 Pouch A and 1 Pouch B into the disposable container    Add lukewarm drinking water to the top line of the container. Mix to dissolve    Refrigerate (mixed solution should be used within 24 hrs)  5.  Begin drinking the prep at 5:00 p.m. The MoviPrep container is divided by 4 marks.   Every 15 minutes drink the solution down to the next mark (approximately 8 oz) until the full liter is complete.   6.  Follow completed prep with 16 oz of clear liquid of your choice  (Nothing red or purple).  Continue to drink clear liquids until bedtime.  7.  Before going to bed, mix second dose of MoviPrep solution:    Empty 1 Pouch A and 1 Pouch B into the disposable container    Add lukewarm drinking water to the top line of the container. Mix to dissolve    Refrigerate  THE DAY OF YOUR PROCEDURE      DATE:  05/18/09  DAY:  Tuesday  Beginning at  3:30 a.m. (5 hours before procedure):         1. Every 15 minutes, drink the solution down to the next mark (approx 8 oz) until the full liter is complete.  2. Follow completed prep with 16 oz. of clear liquid of your choice.    3. You may drink clear liquids until   6:30am (2 HOURS BEFORE PROCEDURE).   MEDICATION INSTRUCTIONS  Unless otherwise instructed, you should take regular prescription medications with a small sip of  water   as early as possible the morning of your procedure.         OTHER INSTRUCTIONS  You will need a responsible adult at least 66 years of age to accompany you and drive you home.   This person must remain in the waiting room during your procedure.  Wear loose fitting clothing that is easily removed.  Leave jewelry and other valuables at home.  However, you may wish to bring a book to read or  an iPod/MP3 player to listen to music as you wait for your procedure to start.  Remove all body piercing jewelry and leave at home.  Total time from sign-in until discharge is approximately 2-3 hours.  You should go home directly after your procedure and rest.  You can resume normal activities the  day after your procedure.  The day of your procedure you should not:   Drive   Make legal decisions   Operate machinery   Drink alcohol   Return to work  You will receive specific instructions about eating, activities and medications before you leave.    The above instructions have been reviewed and explained to me by   Ulis Rias RN  May 04, 2009 1:16 PM     I fully  understand and can verbalize these instructions _____________________________ Date _________

## 2010-03-03 NOTE — Progress Notes (Signed)
Summary: REFILL Crestor X 1 year  Phone Note Refill Request Message from:  Fax from Pharmacy  Refills Requested: Medication #1:  CRESTOR 10 MG TABS two tabs daily   [phone]       [phone]   Initial call taken by: Warnell Forester,  March 04, 2009 8:44 AM    Prescriptions: CRESTOR 10 MG TABS (ROSUVASTATIN CALCIUM) two tabs daily  #180 x 3   Entered by:   Sid Falcon LPN   Authorized by:   Evelena Peat MD   Signed by:   Sid Falcon LPN on 21/30/8657   Method used:   [facility]        [facility]  [address] [phone][facility]ail[facility]       [address] [place], [place]  [address]       Ph: [phone] or [phone]       Fax: [phone]   RxID:   [id number]

## 2010-03-03 NOTE — Assessment & Plan Note (Signed)
Summary: 4 mo rov/mm   Vital Signs:  Patient profile:   65 year old female Menstrual status:  postmenopausal Weight:      206 pounds Temp:     98.0 degrees F oral BP sitting:   102 / 70  (left arm) Cuff size:   large  Vitals Entered By: Sid Falcon LPN (Jun 15, 2009 8:54 AM) CC: 4 month follow-up, Hypertension Management, Lipid Management   History of Present Illness: Patient recently diagnosed with breast cancer.  She is to start on chemotherapy and will have several weeks of this followed by radiation therapy. Planned surgery in approximately 4 months. PET Scan in late April revealed no evidence for metastatic disease. Patient brings in copy of recent CBC significant for hemoglobin 10.7 with normal white blood count. Patient is on 3 drug regimen for chemotherapy. Emotionally she seems to be doing well.  Hypertension treated with myocarditis. Transitioning to losartan for cost issues. Hyperlipidemia treated with Crestor. Tolerating well no side effects. Recent labs reviewed with patient. Significant for low HDL but LDL at goal. Glucose 121 with no prior history of prediabetes or diabetes. Denies urine frequency or thirst.  she has reduced sugar and starch intake since starting her chemotherapy  History of depression treated with Effexor XR. Symptoms stable  Hypertension History:      She denies headache, chest pain, palpitations, dyspnea with exertion, orthopnea, PND, peripheral edema, visual symptoms, neurologic problems, syncope, and side effects from treatment.        Positive major cardiovascular risk factors include female age 70 years old or older, hyperlipidemia, and hypertension.  Negative major cardiovascular risk factors include no history of diabetes and non-tobacco-user status.        Further assessment for target organ damage reveals no history of ASHD, stroke/TIA, or peripheral vascular disease.    Lipid Management History:      Positive NCEP/ATP III risk factors  include female age 40 years old or older, HDL cholesterol less than 40, and hypertension.  Negative NCEP/ATP III risk factors include non-diabetic, non-tobacco-user status, no ASHD (atherosclerotic heart disease), no prior stroke/TIA, no peripheral vascular disease, and no history of aortic aneurysm.      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 Cholecystectomy  1998  Family History: Last updated: 08/11/2008 Family History of Arthritis Family History High cholesterol Family History Hypertension Family history prostate cancer, father Family history breast cancer, mother  Social History: Last updated: 08/11/2008 Occupation:  Public librarian Married Never Smoked Alcohol use-no Regular exercise-no  Past Medical History: Interstitial cystitis Hyperlipidemia Hypertension Osteopenia Gastric polyps Depression Menopause 1995 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    Physical Exam  General:  Well-developed,well-nourished,in no acute distress; alert,appropriate and cooperative throughout examination Mouth:  Oral mucosa and oropharynx without lesions or exudates.  Teeth in good repair. Neck:  No deformities, masses, or tenderness noted. Lungs:  Normal respiratory effort, chest expands symmetrically. Lungs are clear to auscultation, no crackles or wheezes. Heart:  Normal rate and regular rhythm. S1 and S2 normal without gallop, murmur, click, rub or other extra sounds. Extremities:  no edema   Impression & Recommendations:  Problem # 1:  HYPERTENSION  (ICD-401.9)  Her updated  medication list for this problem includes:    Losartan Potassium 100 Mg Tabs (Losartan potassium) .Marland Kitchen... 1 once daily  Problem # 2:  HYPERLIPIDEMIA (ICD-272.4)  Her updated medication list for this problem includes:    Crestor 20 Mg Tabs (Rosuvastatin calcium) ..... Once daily  Problem # 3:  GERD (ICD-530.81)  Her updated medication list for this problem includes:    Nexium 40 Mg Cpdr (Esomeprazole magnesium) ..... Once daily  Orders: Prescription Created Electronically [phone])  Problem # 4:  DEPRESSION (ICD-311)  Her updated medication list for this problem includes:    Effexor Xr 75 Mg Xr24h-cap (Venlafaxine hcl) ..... Once daily    Hydroxyzine Hcl 25 Mg Tabs (Hydroxyzine hcl) ..... Once daily    Lorazepam 0.5 Mg Tabs (Lorazepam) .Marland Kitchen... As needed sleep  Orders: Prescription Created Electronically [phone])  Problem # 5:  PREDIABETES (ICD-790.29) Assessment: New Discussed importance of weight loss and recheck fasting CBG in 3 months.  Complete Medication List: 1)  Crestor 20 Mg Tabs (Rosuvastatin calcium) .... Once daily 2)  Effexor Xr 75 Mg Xr24h-cap (Venlafaxine hcl) .... Once daily 3)  Nexium 40 Mg Cpdr (Esomeprazole magnesium) .... Once daily 4)  Losartan Potassium 100 Mg Tabs (Losartan potassium) .Marland Kitchen.. 1 once daily 5)  Aspirin 81 Mg Tabs (Aspirin) .... Once daily 6)  Elmiron 100 Mg Caps (Pentosan polysulfate sodium) .... Once daily 7)  Hydroxyzine Hcl 25 Mg Tabs (Hydroxyzine hcl) .... Once daily 8)  Vitamin D 1000 Unit Tabs (Cholecalciferol) .... Once daily 9)  Centrum Ultra Womens Tabs (Multiple vitamins-minerals) .... Once daily 10)  Calcium 1500 Mg Tabs (Calcium carbonate) .... Once daily 11)  Fish Oil 500 Mg Caps (Omega-3 fatty acids) .... Once daily 12)  B Complex Vitamins Caps (B complex vitamins) .... Once daily 13)  Vitamin E 100 Unit Caps (Vitamin e) .... Once daily 14)  Lorazepam 0.5 Mg Tabs (Lorazepam) .... As needed  sleep  Hypertension Assessment/Plan:      The patient's hypertensive risk group is category B: At least one risk factor (excluding diabetes) with no target organ damage.  Her calculated 10 year risk of coronary heart disease is 6 %.  Today's blood pressure is 102/70.    Lipid Assessment/Plan:      Based on NCEP/ATP III, the patient's risk factor category is "0-1 risk factors".  The patient's lipid goals are as follows: Total cholesterol goal is 200; LDL cholesterol goal is 130; HDL cholesterol goal is 40; Triglyceride goal is 150.    Patient Instructions: 1)  Please schedule a follow-up appointment in 3 months .  2)  It is important that you exercise reguarly at least 20 minutes 5 times a week. If you develop chest pain, have severe difficulty breathing, or feel very tired, stop exercising immediately and seek medical attention.  3)  You need to lose weight. Consider a lower calorie diet and regular exercise.  Prescriptions: NEXIUM 40 MG CPDR (ESOMEPRAZOLE MAGNESIUM) once daily  #30 x 11   Entered and Authorized by:   Evelena Peat MD   Signed by:   Evelena Peat MD on 06/15/2009   Method used:   [facility]  [address] [phone][facility]ail[facility]       [address]       [place], [place]  [address]       Ph: [phone] or [phone]       Fax: [phone]  RxID:   [id number] EFFEXOR XR 75 MG XR24H-CAP (VENLAFAXINE HCL) once daily  #30 x 11   Entered and Authorized by:   Evelena Peat MD   Signed by:   Evelena Peat MD on 06/15/2009   Method used:   [facility]        [facility]  [address] [phone][facility] ([facility]ail[facility]       [address]       [place], [place]  [address]       Ph: [phone] or [phone]       Fax: [phone]   RxID:   [id number]

## 2010-03-03 NOTE — Letter (Signed)
[facility]   Imported By: Maryln Gottron 09/09/2009 12:59:33  _____________________________________________________________________  External Attachment:    Type:   Image     Comment:   External Document

## 2010-03-03 NOTE — Letter (Signed)
[facility]   Imported By: Maryln Gottron 08/17/2009 13:50:56  _____________________________________________________________________  External Attachment:    Type:   Image     Comment:   External Document

## 2010-03-03 NOTE — Letter (Signed)
[facility]   Imported By: Maryln Gottron 07/15/2009 08:39:14  _____________________________________________________________________  External Attachment:    Type:   Image     Comment:   External Document

## 2010-03-03 NOTE — Progress Notes (Signed)
  Phone Note Call from Patient   Summary of Call: left message on machine needs micardis change due to insurance change-[employer] will not cover-having chemo thru [facility] on wedneday-fyi-may call back at [phone] untill11;30--then [phone]  Initial call taken by: Willy Eddy, LPN,  May 24, 2009 9:51 AM  Follow-up for Phone Call        OK to change to Losartan 100 mg by mouth once daily.  May refill for 6 months.  office follow up approx 2 months after change to assess stability of BP. Follow-up by: Evelena Peat MD,  May 24, 2009 11:04 AM  Additional Follow-up for Phone Call Additional follow up Details #1::        pt informed and med sent-has ov in may Additional Follow-up by: Willy Eddy, LPN,  May 24, 2009 3:49 PM    New/Updated Medications: LOSARTAN POTASSIUM 100 MG TABS (LOSARTAN POTASSIUM) 1 once daily Prescriptions: LOSARTAN POTASSIUM 100 MG TABS (LOSARTAN POTASSIUM) 1 once daily  #30 x 6   Entered by:   Willy Eddy, LPN   Authorized by:   Stacie Glaze MD   Signed by:   Willy Eddy, LPN on 45/40/9811   Method used:   [facility]  [address] [phone][facility] ([facility]ail[facility]       [address] [place], [place]  [address]       Ph: [phone] or [phone]       Fax: [phone]   RxID:   [id number]

## 2010-03-03 NOTE — Consult Note (Signed)
Summary: [facility]   [facility]   Imported By: Maryln Gottron 05/18/2009 12:41:44  _____________________________________________________________________  External Attachment:    Type:   Image     Comment:   External Document

## 2010-03-03 NOTE — Letter (Signed)
Summary: [facility]   Imported By: Maryln Gottron 10/19/2009 09:23:36  _____________________________________________________________________  External Attachment:    Type:   Image     Comment:   External Document

## 2010-03-03 NOTE — Letter (Signed)
[facility]   Imported By: Maryln Gottron 08/17/2009 13:52:22  _____________________________________________________________________  External Attachment:    Type:   Image     Comment:   External Document

## 2010-03-03 NOTE — Letter (Signed)
Summary: [facility]  [facility]   Imported By: Maryln Gottron 10/11/2009 14:29:14  _____________________________________________________________________  External Attachment:    Type:   Image     Comment:   External Document

## 2010-03-03 NOTE — Letter (Signed)
[facility]   Imported By: Maryln Gottron 07/21/2009 14:55:02  _____________________________________________________________________  External Attachment:    Type:   Image     Comment:   External Document

## 2010-03-03 NOTE — Letter (Signed)
[facility]   Imported By: Maryln Gottron 07/15/2009 08:37:52  _____________________________________________________________________  External Attachment:    Type:   Image     Comment:   External Document

## 2010-03-03 NOTE — Letter (Signed)
Summary:  [facility]  [facility]   Imported By: Sherian Rein 12/15/2009 09:15:07  _____________________________________________________________________  External Attachment:    Type:   Image     Comment:   External Document

## 2010-03-03 NOTE — Letter (Signed)
[facility]   Imported By: Maryln Gottron 06/29/2009 13:24:57  _____________________________________________________________________  External Attachment:    Type:   Image     Comment:   External Document

## 2010-03-03 NOTE — Procedures (Signed)
Summary: Colonoscopy  Patient: Shannon [name] Note: All result statuses are Final unless otherwise noted.  Tests: (1) Colonoscopy (COL)   COL Colonoscopy           DONE     [facility]     [address]     [place], [place]  [address]           COLONOSCOPY PROCEDURE REPORT           PATIENT:  Shannon [name], Shannon [name]  MR#:  [id number]     BIRTHDATE:  09-07-1945, 63 yrs. old  GENDER:  female           ENDOSCOPIST:  Barbette Hair. Arlyce Dice, MD     Referred by:           PROCEDURE DATE:  05/18/2009     PROCEDURE:  Diagnostic Colonoscopy     ASA CLASS:  Class II     INDICATIONS:  1) Routine Risk Screening           MEDICATIONS:   Fentanyl 125 mcg IV, Versed 12 mg IV, Benadryl 37.5     mg IV           DESCRIPTION OF PROCEDURE:   After the risks benefits and     alternatives of the procedure were thoroughly explained, informed     consent was obtained.  Digital rectal exam was performed and     revealed no abnormalities.   The LB [id number]-[id number] endoscope     was introduced through the anus and advanced to the cecum, which     was identified by both the appendix and ileocecal valve, without     limitations.  The quality of the prep was excellent, using     MoviPrep.  The instrument was then slowly withdrawn as the colon     was fully examined.     <<PROCEDUREIMAGES>>           FINDINGS:  A normal appearing cecum, ileocecal valve, and     appendiceal orifice were identified. The ascending, hepatic     flexure, transverse, splenic flexure, descending, sigmoid colon,     and rectum appeared unremarkable (see image2, image3, image4,     image5, image9, image10, image13, and image14).   Retroflexed     views in the rectum revealed no abnormalities.    The time to     cecum =  2  minutes. The scope was then withdrawn (time =  6.75     min) from the patient and the procedure completed.           COMPLICATIONS:  None           ENDOSCOPIC IMPRESSION:     1) Normal colon      RECOMMENDATIONS:     1) Continue current colorectal screening recommendations for     "routine risk" patients with a repeat colonoscopy in 10 years.           REPEAT EXAM:  In 10 year(s) for Colonoscopy.           ______________________________     Barbette Hair. Arlyce Dice, MD           CC: Lindley Magnus, MD           n.     Rosalie DoctorBarbette Hair. Kaplan at 05/18/2009 03:12 PM           Astrid Drafts, [id number]  Note: An exclamation mark (!) indicates a  result that was not dispersed into the flowsheet. Document Creation Date: 05/18/2009 3:13 PM _______________________________________________________________________  (1) Order result status: Final Collection or observation date-time: 05/18/2009 14:54 Requested date-time:  Receipt date-time:  Reported date-time:  Referring Physician:   Ordering Physician: Melvia Heaps [phone]) Specimen Source:  Source: Launa Grill Order Number: [id number] Lab site:   Appended Document: Colonoscopy    Clinical Lists Changes  Observations: Added new observation of COLONNXTDUE: 05/2019 (05/18/2009 16:25)

## 2010-03-03 NOTE — Letter (Signed)
[facility]   Imported By: Maryln Gottron 07/30/2009 14:23:22  _____________________________________________________________________  External Attachment:    Type:   Image     Comment:   External Document

## 2010-03-03 NOTE — Letter (Signed)
[facility]   Imported By: Maryln Gottron 06/17/2009 12:30:41  _____________________________________________________________________  External Attachment:    Type:   Image     Comment:   External Document

## 2010-03-03 NOTE — Procedures (Signed)
Summary: Upper Endoscopy  Patient: [name] Note: All result statuses are Final unless otherwise noted.  Tests: (1) Upper Endoscopy (EGD)   EGD Upper Endoscopy       DONE     [facility]     [address]     [place], [place]  [address]           ENDOSCOPY PROCEDURE REPORT           PATIENT:  Shannon, [name]  MR#:  [id number]     BIRTHDATE:  March 08, 1945, 63 yrs. old  GENDER:  female           ENDOSCOPIST:  Barbette Hair. Arlyce Dice, MD     Referred by:           PROCEDURE DATE:  05/18/2009     PROCEDURE:  EGD, diagnostic, Maloney Dilation of Esophagus     ASA CLASS:  Class II     INDICATIONS:  dysphagia           MEDICATIONS:   There was residual sedation effect present from     prior procedure., Versed 2 mg IV, glycopyrrolate (Robinal) 0.2 mg     IV, 0.6cc simethancone 0.6 cc PO     TOPICAL ANESTHETIC:  Exactacain Spray           DESCRIPTION OF PROCEDURE:   After the risks benefits and     alternatives of the procedure were thoroughly explained, informed     consent was obtained.  The [facility] GIF-H180 [id number] endoscope was     introduced through the mouth and advanced to the third portion of     the duodenum, without limitations.  The instrument was slowly     withdrawn as the mucosa was fully examined.     <<PROCEDUREIMAGES>>           There were multiple polyps identified. in the antrum (see image2).     Previously described and biopsied polyps (hyperplastic)  Otherwise     the examination was normal. Dilation with maloney dilator 18mm     Minimal resistance; no heme    Retroflexed views revealed no     abnormalities.    The scope was then withdrawn from the patient     and the procedure completed.           COMPLICATIONS:  None           ENDOSCOPIC IMPRESSION:     1) Polyps, multiple in the antrum     2) Otherwise normal examination- s/p maloney dilitation for     dysphagia     RECOMMENDATIONS:     1) dilatations PRN           REPEAT EXAM:  No        ______________________________     Barbette Hair. Arlyce Dice, MD           CC:  Lindley Magnus, MD           n.     Rosalie DoctorBarbette Hair. Traniyah Hallett at 05/18/2009 03:16 PM           Astrid Drafts, [id number]  Note: An exclamation mark (!) indicates a result that was not dispersed into the flowsheet. Document Creation Date: 05/18/2009 3:17 PM _______________________________________________________________________  (1) Order result status: Final Collection or observation date-time: 05/18/2009 15:00 Requested date-time:  Receipt date-time:  Reported date-time:  Referring Physician:   Ordering Physician: Melvia Heaps [phone]) Specimen Source:  Source: Launa Grill  Order Number: [id number] Lab site:

## 2010-03-03 NOTE — Letter (Signed)
Summary: [facility]  [facility]   Imported By: Maryln Gottron 01/14/2010 13:23:23  _____________________________________________________________________  External Attachment:    Type:   Image     Comment:   External Document

## 2010-03-03 NOTE — Letter (Signed)
[facility]   Imported By: Maryln Gottron 09/23/2009 12:48:54  _____________________________________________________________________  External Attachment:    Type:   Image     Comment:   External Document

## 2010-03-03 NOTE — Letter (Signed)
[facility]   Imported By: Maryln Gottron 09/15/2009 15:45:03  _____________________________________________________________________  External Attachment:    Type:   Image     Comment:   External Document

## 2010-03-03 NOTE — Letter (Signed)
Summary: [facility]  [facility]   Imported By: Maryln Gottron 10/15/2009 10:26:25  _____________________________________________________________________  External Attachment:    Type:   Image     Comment:   External Document

## 2010-03-03 NOTE — Letter (Signed)
Summary: [facility]   Imported By: Maryln Gottron 07/20/2009 14:10:02  _____________________________________________________________________  External Attachment:    Type:   Image     Comment:   External Document

## 2010-03-03 NOTE — Progress Notes (Signed)
Summary: Triage--ECL?  Phone Note Call from Patient Call back at Home Phone [phone] Call back at [phone]   Caller: Patient Call For: Dr. Arlyce Dice Reason for Call: Talk to Nurse Summary of Call: wants to speak specifically to Gavin Pound. would like to know if she can have an ECL verses just a COL. Initial call taken by: Vallarie Mare,  May 04, 2009 1:38 PM  Follow-up for Phone Call        Pt. c/o hx. of esophageal spasm and hiatal hernia.  She takes Nexium daily. Now with c/o  dysphagia and occ. heartburn. She would like to add an Endo. to her Colonoscopy that is scheduled for 05-18-09. Also, she is changing insurances in May and has met her deductible for the current insurance, so would appreciate to be able to get the Endo. done before May.   [facility] PLEASE ADVISE  Follow-up by: Laureen Ochs LPN,  May 05, 2009 10:55 AM  Additional Follow-up for Phone Call Additional follow up Details #1::        ok Additional Follow-up by: Louis Meckel MD,  May 05, 2009 2:28 PM    Additional Follow-up for Phone Call Additional follow up Details #2::    Pt. advised Dr.Kaplan approved adding an Endoscopy/poss. dil. to her appt. Her procedures will remain on 05-18-09, but the time moved to 2:30pm. All instructions updated with pt. by phone. Pt. instructed to call back as needed.    Follow-up by: Laureen Ochs LPN,  May 05, 2009 3:07 PM

## 2010-03-03 NOTE — Letter (Signed)
[facility]   Im[facility] By: Maryln Gottron 06/15/2009 08:33:36  _____________________________________________________________________  External Attachment:    Type:   Image     Comment:   External Document

## 2010-03-03 NOTE — Letter (Signed)
[facility]   Imported By: Maryln Gottron 08/23/2009 14:00:34  _____________________________________________________________________  External Attachment:    Type:   Image     Comment:   External Document

## 2010-03-03 NOTE — Letter (Signed)
Summary: [facility]  [facility]   Imported By: Maryln Gottron 09/01/2009 13:36:51  _____________________________________________________________________  External Attachment:    Type:   Image     Comment:   External Document

## 2010-03-03 NOTE — Letter (Signed)
Summary: [facility]  [facility]   Imported By: Maryln Gottron 10/15/2009 10:28:03  _____________________________________________________________________  External Attachment:    Type:   Image     Comment:   External Document

## 2010-03-03 NOTE — Letter (Signed)
Summary: [facility]  [facility]   Imported By: Maryln Gottron 01/05/2010 08:54:01  _____________________________________________________________________  External Attachment:    Type:   Image     Comment:   External Document

## 2010-03-03 NOTE — Letter (Signed)
Summary: [facility]   Imported By: Maryln Gottron 05/28/2009 11:15:42  _____________________________________________________________________  External Attachment:    Type:   Image     Comment:   External Document

## 2010-03-03 NOTE — Letter (Signed)
Summary: [facility]  [facility]   Imported By: Maryln Gottron 11/11/2009 13:58:12  _____________________________________________________________________  External Attachment:    Type:   Image     Comment:   External Document

## 2010-03-03 NOTE — Letter (Signed)
[facility]   Imported By: Maryln Gottron 09/23/2009 15:52:47  _____________________________________________________________________  External Attachment:    Type:   Image     Comment:   External Document

## 2010-03-03 NOTE — Letter (Signed)
Summary: [facility]  [facility]   Imported By: Maryln Gottron 10/15/2009 10:24:51  _____________________________________________________________________  External Attachment:    Type:   Image     Comment:   External Document

## 2010-03-03 NOTE — Assessment & Plan Note (Signed)
Summary: 6 month rov/njr   Vital Signs:  Patient profile:   65 year old female Menstrual status:  postmenopausal Weight:      209 pounds O2 Sat:      97 % on Room air Temp:     98.2 degrees F oral Pulse rate:   84 / minute Pulse rhythm:   regular BP sitting:   122 / 84  (left arm) Cuff size:   large  Vitals Entered By: Sid Falcon LPN (February 10, 2009 10:19 AM)  O2 Flow:  Room air CC: 6 month follow-up, some SOB with exersion   History of Present Illness: Followup multiple medical problems as below.  Patient has had some dyspnea with activity such as going upstairs for months. This is not progressive. No chest pain. She suspects deconditioning. She has not started any exercise since last visit. No cough or wheeze. No history of heart failure. No edema issues. Denies orthopnea. Family history of coronary disease her father in his 20s. Patient is nonsmoker.  Blood pressure well controlled on Micardis. No orthostatic symptoms. No dizziness.  Hyperlipidemia treated with Crestor 10 mg. We had recommended increased to 20 mg but she never made this change. 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 She thinks this is still slowly enlarging. Has had evaluated by general surgeon in the past. She would like to consider excision. This is starting to rub as she walks.    Allergies (verified): No Known Drug Allergies  Past History:  Family History: Last updated: 08/11/2008 Family History of Arthritis Family History High cholesterol Family History Hypertension Family history prostate cancer, father Family history breast cancer, mother  Social History: Last updated: 08/11/2008 Occupation:  Public librarian Married Never Smoked Alcohol use-no Regular exercise-no  Risk Factors: Exercise: no (08/11/2008)  Risk Factors: Smoking Status: never (08/11/2008)  Past Medical  History: Interstitial cystitis Hyperlipidemia Hypertension Osteopenia Gastric polyps Depression Menopause 1995 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  The patient denies anorexia, fever, weight loss, weight gain, vision loss, decreased hearing, chest pain, syncope, peripheral edema, prolonged cough, headaches, hemoptysis, abdominal pain, melena, hematochezia, depression, and enlarged lymph nodes.         occasional heartburn symptoms but mostly controlled with Nexium  Physical Exam  General:  Well-developed,well-nourished,in no acute distress; alert,appropriate and cooperative throughout examination Mouth:  Oral mucosa and oropharynx without lesions or exudates.  Teeth in good repair. Neck:  No deformities, masses, or tenderness noted. Lungs:  Normal respiratory effort, chest expands symmetrically. Lungs are clear to auscultation, no crackles or wheezes. Heart:  Normal rate and regular rhythm. S1 and S2 normal without gallop, murmur, click, rub or other extra sounds. Extremities:  No clubbing, cyanosis, edema, or deformity noted with normal full range of motion of all joints.  right upper inner thigh reveals very large lipomatous mass which is nontender. No skin changes. This is approximately 8 x 14 cm   Impression & Recommendations:  Problem # 1:  GERD (ICD-530.81) Assessment Unchanged  Her updated medication list for this problem includes:    Nexium 40 Mg Cpdr (Esomeprazole magnesium) ..... Once daily  Problem # 2:  HYPERTENSION (ICD-401.9) Assessment: Unchanged  Her updated medication list for this problem includes:    Micardis 80  Mg Tabs (Telmisartan) ..... Once daily  Problem # 3:  HYPERLIPIDEMIA (ICD-272.4) weight loss and recheck in 4 months. Her updated medication list for this problem includes:     Crestor 10 Mg Tabs (Rosuvastatin calcium) .Marland Kitchen..Marland Kitchen Two tabs daily  Problem # 4:  DYSPNEA ON EXERTION (ICD-786.09) strongly suspect deconditioning and obesity predominantly. She'll work on starting [employer] and joining a gym starting an exercise program. Prompt followup if she has any progressive dyspnea or chest pain or new symptoms  Problem # 5:  LIPOMA (ICD-214.9)  referral general surgeon to further assess  Orders: Surgical Referral (Surgery)  Complete Medication List: 1)  Crestor 10 Mg Tabs (Rosuvastatin calcium) .... Two tabs daily 2)  Effexor Xr 75 Mg Xr24h-cap (Venlafaxine hcl) .... Once daily 3)  Nexium 40 Mg Cpdr (Esomeprazole magnesium) .... Once daily 4)  Micardis 80 Mg Tabs (Telmisartan) .... Once daily 5)  Aspirin 81 Mg Tabs (Aspirin) .... Once daily 6)  Elmiron 100 Mg Caps (Pentosan polysulfate sodium) .... Once daily 7)  Hydroxyzine Hcl 25 Mg Tabs (Hydroxyzine hcl) .... Once daily 8)  Vitamin D 1000 Unit Tabs (Cholecalciferol) .... Once daily 9)  Centrum Ultra Womens Tabs (Multiple vitamins-minerals) .... Once daily 10)  Calcium 1500 Mg Tabs (Calcium carbonate) .... Once daily 11)  Fish Oil 500 Mg Caps (Omega-3 fatty acids) .... Once daily 12)  B Complex Vitamins Caps (B complex vitamins) .... Once daily 13)  Vitamin E 100 Unit Caps (Vitamin e) .... Once daily  Other Orders: Admin 1st Vaccine (82956) Flu Vaccine 59yrs + (21308)  Patient Instructions: 1)  Please schedule a follow-up appointment in 4 months .  2)  It is important that you exercise reguarly at least 20 minutes 5 times a week. If you develop chest pain, have severe difficulty breathing, or feel very tired, stop exercising immediately and seek medical attention.  3)  You need to lose weight. Consider a lower calorie diet and regular exercise.  4)  BMP prior to visit, ICD-9: 401.9 5)  Hepatic Panel prior to visit ICD-9: 272.4 6)  Lipid panel prior to visit ICD-9 : 272.4  Flu Vaccine Consent  Questions     Do you have a history of severe allergic reactions to this vaccine? no    Any prior history of allergic reactions to egg and/or gelatin? no    Do you have a sensitivity to the preservative Thimersol? no    Do you have a past history of Guillan-Barre Syndrome? no    Do you currently have an acute febrile illness? no    Have you ever had a severe reaction to latex? no    Vaccine information given and explained to patient? yes    Are you currently pregnant? no    Lot Number:[id number]   Exp Date:07/29/2009   Site Given  Left Deltoid IMbflu

## 2010-03-03 NOTE — Miscellaneous (Signed)
Summary: RECALL COL ..E.M  Clinical Lists Changes  Medications: Added new medication of MOVIPREP 100 GM  SOLR (PEG-KCL-NACL-NASULF-NA ASC-C) As per prep instructions. - Signed Rx of MOVIPREP 100 GM  SOLR (PEG-KCL-NACL-NASULF-NA ASC-C) As per prep instructions.;  #1 x 0;  Signed;  Entered by: Ulis Rias RN;  Authorized by: Louis Meckel MD;  Method used: [facility]  [address] [phone] [address] [place], [place], [place]  [address], Ph: [phone] or [phone], Fax: [phone] Observations: Added new observation of NKA: T (05/04/2009 12:55)    Prescriptions: MOVIPREP 100 GM  SOLR (PEG-KCL-NACL-NASULF-NA ASC-C) As per prep instructions.  #1 x 0   Entered by:   Ulis Rias RN   Authorized by:   Louis Meckel MD   Signed by:   Ulis Rias RN on 05/04/2009   Method used:   [facility] to        [facility]  [address] [phone]* (retail)       [address]       [place], [place]  [address]       Ph: [phone] or [phone]       Fax: [phone]   RxID:   [id number]

## 2010-03-03 NOTE — Letter (Signed)
Summary: [facility]  [facility]   Imported By: Maryln Gottron 12/31/2009 09:32:06  _____________________________________________________________________  External Attachment:    Type:   Image     Comment:   External Document

## 2010-03-08 ENCOUNTER — Institutional Professional Consult (permissible substitution) (INDEPENDENT_AMBULATORY_CARE_PROVIDER_SITE_OTHER): Payer: Medicare Other | Admitting: Cardiology

## 2010-03-08 ENCOUNTER — Encounter: Payer: Self-pay | Admitting: Cardiology

## 2010-03-08 DIAGNOSIS — R0989 Other specified symptoms and signs involving the circulatory and respiratory systems: Secondary | ICD-10-CM

## 2010-03-08 DIAGNOSIS — I429 Cardiomyopathy, unspecified: Secondary | ICD-10-CM

## 2010-03-08 DIAGNOSIS — R079 Chest pain, unspecified: Secondary | ICD-10-CM | POA: Insufficient documentation

## 2010-03-08 DIAGNOSIS — I428 Other cardiomyopathies: Secondary | ICD-10-CM

## 2010-03-08 DIAGNOSIS — R0789 Other chest pain: Secondary | ICD-10-CM

## 2010-03-08 DIAGNOSIS — R0609 Other forms of dyspnea: Secondary | ICD-10-CM

## 2010-03-08 DIAGNOSIS — R9389 Abnormal findings on diagnostic imaging of other specified body structures: Secondary | ICD-10-CM

## 2010-03-08 HISTORY — DX: Other cardiomyopathies: I42.8

## 2010-03-15 ENCOUNTER — Telehealth (INDEPENDENT_AMBULATORY_CARE_PROVIDER_SITE_OTHER): Payer: Self-pay | Admitting: *Deleted

## 2010-03-15 ENCOUNTER — Telehealth: Payer: Self-pay | Admitting: Cardiology

## 2010-03-16 ENCOUNTER — Encounter: Payer: Self-pay | Admitting: Cardiology

## 2010-03-16 ENCOUNTER — Ambulatory Visit (HOSPITAL_COMMUNITY): Payer: Medicare Other | Attending: Internal Medicine

## 2010-03-16 DIAGNOSIS — R0789 Other chest pain: Secondary | ICD-10-CM

## 2010-03-16 DIAGNOSIS — R0989 Other specified symptoms and signs involving the circulatory and respiratory systems: Secondary | ICD-10-CM | POA: Insufficient documentation

## 2010-03-16 DIAGNOSIS — R0609 Other forms of dyspnea: Secondary | ICD-10-CM | POA: Insufficient documentation

## 2010-03-17 NOTE — Assessment & Plan Note (Signed)
Summary: np6/chest pains referring Md DR. Magrinat pt have bcbs/medica...   Visit Type:  new pt Primary Provider:  Dr. Caryl Never  CC:  sob. chest pain. headaches..  History of Present Illness: Shannon [name] is a delightful 65 year old white female who comes today referred by Dr. 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  Her chief complaint today is that of some shortness of breath with exertion. She denies orthopnea, PND or edema. She also has had some chest tightness that occurred one evening after dinner. It went into her left arm. No nausea vomiting diaphoresis. She has been diagnosed with gastroesophageal spasm in the past at [facility]. She does not have exertional chest tightness or discomfort.  Her last echocardiogram showed decrease in left ventricular function with ejection fraction of 45-50%. Shannon [name] motion was normal. There was no regional Shannon [name] motion amount ooze. There was no LVH. Cavity size was small. There was no significant mitral regurgitation. Pulmonary pressures were normal. Right-sided function was normal.  Her previous echoes have been normal. The one I have reviewed with May of 2011 which showed EF of 55-60% with some minimal diastolic dysfunction.  She does have cardiac risk factors for coronary disease including age, hypertension, hyperlipidemia, low HDL, and obesity.  She was diagnosed with breast cancer last year. She had about 7-8 months of Herceptin for H2 receptor positive tumor. She also had radiation to her right breast. Her axillary dissection was negative per her daughter.  I just received Dr. Princella Pellegrini note and confirmed the above.  Current Medications (verified): 1)  Crestor 20 Mg Tabs (Rosuvastatin Calcium) .... Once Daily 2)  Effexor Xr 75 Mg Xr24h-Cap (Venlafaxine Hcl) .... Once Daily 3)  Nexium 40 Mg Cpdr (Esomeprazole Magnesium) .... Once Daily 4)  Aspirin 81 Mg Tabs (Aspirin) .... Once Daily 5)  Elmiron 100 Mg Caps (Pentosan Polysulfate  Sodium) .... Take 1 Tablet By Mouth Two Times A Day 6)  Hydroxyzine Hcl 25 Mg Tabs (Hydroxyzine Hcl) .... Once Daily 7)  Vitamin D 1000 Unit Tabs (Cholecalciferol) .... Once Daily 8)  Centrum Ultra Womens  Tabs (Multiple Vitamins-Minerals) .... Once Daily 9)  Calcium 1500 Mg Tabs (Calcium Carbonate) .... Two Times A Day 10)  Fish Oil 500 Mg Caps (Omega-3 Fatty Acids) .... Two Times A Day 11)  B Complex Vitamins  Caps (B Complex Vitamins) .... Once Daily 12)  Vitamin E 400 Unit Caps (Vitamin E) .... Once Daily 13)  Lorazepam 0.5 Mg Tabs (Lorazepam) .Marland Kitchen.. 1tab At Bedtime 14)  Micardis 80 Mg Tabs (Telmisartan) .... Take 1 Tablet By Mouth Once A Day 15)  Selenium 50 Mcg Tabs (Selenium) .... Once Daily 16)  Neurontin 300 Mg Caps (Gabapentin) .... Qd  Allergies (verified): No Known Drug Allergies  Past History:  Past Medical History: Last updated: 06/15/2009 Interstitial cystitis Hyperlipidemia Hypertension Osteopenia Gastric polyps Depression Menopause 1995 Melanoma 12/06 Pituitary craniopharyngioma 2000 Breast Cancer Ductal carcinoma R  Past Surgical History: Last updated: 02/10/2009 Brain tumor 2000 craniopharyngioma Cholecystectomy  1998  Family History: Last updated: 08/11/2008 Family History of Arthritis Family History High cholesterol Family History Hypertension Family history prostate cancer, father Family history breast cancer, mother  Social History: Last updated: 08/11/2008 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  no acute distress Head:  normocephalic and atraumatic Eyes:  PERRLA/EOM intact; conjunctiva and lids normal. Neck:  Neck supple, no JVD. No masses, thyromegaly or abnormal cervical nodes. Chest Shannon [name]:  no deformities or breast masses noted Lungs:  Clear bilaterally to auscultation and percussion. Heart:  PMI poorly appreciated, normal S1-S2, no murmur or gallop, carotids without bruits Abdomen:  nondistended, good bowel sounds, no midline bruit, no hepatosplenomegaly or Msk:  Back normal, normal gait. Muscle strength and tone normal. Pulses:  pulses normal in all 4 extremities Extremities:  No clubbing or cyanosis. Neurologic:  Alert and oriented x 3. Skin:  Intact without lesions or rashes. Psych:  Normal affect.   EKG  Procedure date:  03/08/2010  Findings:      normal sinus rhythm, low voltage, left axis deviation, poor R. progression.  Impression & Recommendations:  Problem # 1:  CHEST PAIN UNSPECIFIED (ICD-786.50) Assessment New by history, this sounds gastroesophageal. With her risk factors, we will rule out obstructive coronary disease particularly with her dyspnea on exertion. Patient and daughter agree with myoview. Her updated medication list for this problem includes:    Aspirin 81 Mg Tabs (Aspirin) ..... Once daily  Orders: EKG w/ Interpretation (93000) Nuclear Stress Test (Nuc Stress Test)  Problem # 2:  DYSPNEA ON EXERTION (ICD-786.09) Assessment: New I do not see any signs of congestive heart failure. Her dyspnea on exertion is probably multifactorial including weight, deconditioning, mild reduction in left ventricular function. We need to rule out coronary ischemia as noted above. The following medications were removed from the medication list:    Losartan Potassium 100 Mg Tabs (Losartan potassium) .Marland Kitchen... 1 once daily Her updated  medication list for this problem includes:    Aspirin 81 Mg Tabs (Aspirin) ..... Once daily    Micardis 80 Mg Tabs (Telmisartan) .Marland Kitchen... Take 1 tablet by mouth once a day  Orders: EKG w/ Interpretation (93000) Nuclear Stress Test (Nuc Stress Test)  Problem # 3:  HYPERTENSION (ICD-401.9)  The following medications were removed from the medication list:    Losartan Potassium 100 Mg Tabs (Losartan potassium) .Marland Kitchen... 1 once daily Her updated medication list for this problem includes:    Aspirin 81 Mg Tabs (Aspirin) ..... Once daily    Micardis 80 Mg Tabs (Telmisartan) .Marland Kitchen... Take 1 tablet by mouth once a day  Problem # 4:  HYPERLIPIDEMIA (ICD-272.4)  Her updated medication list for this problem includes:    Crestor 20 Mg Tabs (Rosuvastatin calcium) ..... Once daily  Problem # 5:  CARDIOMYOPATHY, SECONDARY (ICD-425.9) Assessment: New I have told the patient her daughter that I think this is due to chemotherapy. I've advised her not to take her receive anymore this and will forward this note to Dr.Magrinat. Hopefully, there is other options. We will repeat her echocardiogram in 3 months. If there is no sign of coronary ischemia, I will add an ACE inhibitor as well as a beta blocker. The following medications were removed from the medication list:    Losartan Potassium 100 Mg Tabs (Losartan potassium) .Marland KitchenMarland KitchenMarland KitchenMarland Kitchen 1  once daily Her updated medication list for this problem includes:    Aspirin 81 Mg Tabs (Aspirin) ..... Once daily    Micardis 80 Mg Tabs (Telmisartan) .Marland Kitchen... Take 1 tablet by mouth once a day  Patient Instructions: 1)  Your physician recommends that you schedule a follow-up appointment in: 2-3 weeks after lexiscan with Dr. Daleen Squibb 2)  Your physician recommends that you continue on your current medications as directed. Please refer to the Current Medication list given to you today. 3)  Your physician has requested that you have an lexiscan myoview.  For further information please visit  [email or web address]  Please follow instruction sheet, as given.

## 2010-03-22 ENCOUNTER — Other Ambulatory Visit (HOSPITAL_COMMUNITY): Payer: Medicare Other

## 2010-03-23 NOTE — Progress Notes (Signed)
Summary: re condition and vacation   Phone Note Call from Patient Call back at Home Phone [phone] Call back at [phone]   Caller: Patient Reason for Call: Talk to Nurse Summary of Call: pt states she having a lexican test on 03/22/10 pt is going on a cruise 04/01/10. pt wants to know if she still will be able take her trip.   Initial call taken by: Roe Coombs,  March 15, 2010 10:37 AM  Follow-up for Phone Call        Pt would like an earlier lexiscan as they have a planned cruise.   Follow-up by: Lisabeth Devoid RN,  March 15, 2010 11:58 AM  Additional Follow-up for Phone Call Additional follow up Details #1::        Pt aware Lexiscan appt moved to 2/15 at 12:15pm Additional Follow-up by: Lisabeth Devoid RN,  March 15, 2010 12:23 PM

## 2010-03-23 NOTE — Progress Notes (Signed)
Summary: nuc pre procedure  Phone Note Outgoing Call Call back at Home Phone [phone]   Call placed by: Cathlyn Parsons RN,  March 15, 2010 4:24 PM Call placed to: Patient Reason for Call: Confirm/change Appt Summary of Call: Reviewed information on Myoview Information Sheet (see scanned document for further details).  Spoke with patient.      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

## 2010-03-23 NOTE — Assessment & Plan Note (Signed)
Summary: wt 206/lexiscan/786.09/wall/mcr no prec. 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   Nuclear Pre-Procedure Cardiac Risk Factors: Family History - CAD, Hypertension, Lipids, Obesity Caffeine/Decaff Intake: None NPO After: 9:00 PM Lungs: (R) side slightly coarse; otherwise clear.  O2 Sat 98% on RA. IV 0.9% NS with Angio Cath: 20g     IV Site: L Antecubital IV Started by: Stanton Kidney, EMT-P Chest Size (in) 40     Cup Size C     Height (in): 64 Weight (lb): 205 BMI: 35.32  Nuclear Med Study 1 or 2 day study:  1 day     Stress Test Type:  Treadmill/Lexiscan Reading MD:  Marca Ancona, MD     Referring MD:  Valera Castle, MD Resting Radionuclide:  Technetium 23m Tetrofosmin     Resting Radionuclide Dose:  10 mCi  Stress Radionuclide:  Technetium 3m Tetrofosmin     Stress Radionuclide Dose:  33 mCi   Stress Protocol Exercise Time (min):  2:00 min     Max HR:  152 bpm     Predicted Max HR:  155 bpm  Max Systolic BP: 151 mm Hg     Percent Max HR:  98.06 %Rate Pressure Product:  14782    Stress Test Technologist:  Rea College, CMA-N     Nuclear Technologist:  Doyne Keel, CNMT  Rest Procedure  Myocardial perfusion imaging was performed at rest 45 minutes following the intravenous administration of Technetium 7m Tetrofosmin.  Stress Procedure  The patient received IV Lexiscan 0.4 mg over 15-seconds.  Technetium 27m Tetrofosmin injected at 30-seconds.  There were no significant changes with infusion.  Quantitative spect images were obtained after a 45 minute delay.  QPS Raw Data Images:  Normal; no motion artifact; normal  heart/lung ratio. Stress Images:  Apical perfusion defect.  Rest Images:  Apical perfusion defect, smaller than with stress.  Subtraction (SDS):  Partially reversible apical perfusion defect.  Transient Ischemic Dilatation:  .84  (Normal <1.22)  Lung/Heart Ratio:  .32  (Normal <0.45)  Quantitative Gated Spect Images QGS EDV:  80 ml QGS ESV:  21 ml QGS EF:  74 % QGS cine images:  Normal wall motion.    Overall Impression  Exercise Capacity: Lexiscan with no exercise. BP Response: Normal blood pressure response. Clinical Symptoms: shortness of breath ECG Impression: No significant ST segment change suggestive of ischemia. Overall Impression: Normal LV systolic function.  Partially reversible apical perfusion defect could represent a small area of ischemia.  Alternatively, could be shifting breast attenuation.  Overall low risk study.   Appended Document: wt 206/lexiscan/786.09/wall/mcr no prec. req/saf low risk, no need for cath, add Carvedilol 6.25mg  two times a day, orto precautions with beginning the drug, continue Micardis, followup with me in4 weeks.  Appended Document: wt 206/lexiscan/786.09/wall/mcr no prec. req/saf Pt is aware of test results. Would like to know if okay to keep appt in 2 weeks rather than 4 weeks? She would like to know if she can resume the hercepton treatments with Dr. Arlice Colt as per office visit discussion with Dr. Daleen Squibb. I will forward to Dr. Daleen Squibb for review.  Mylo Red RN   Clinical Lists Changes  Medications: Added  new medication of CARVEDILOL 6.25 MG TABS (CARVEDILOL) Take one tablet by mouth twice a day - Signed Rx of CARVEDILOL 6.25 MG TABS (CARVEDILOL) Take one tablet by mouth twice a day;  #60 x 6;  Signed;  Entered by: Lisabeth Devoid RN;  Authorized by: Gaylord Shih, MD, [facility];  Method used: [facility]  [address] [phone] [address] [place], [place], [place]  [address], Ph: [phone] or [phone], Fax:  [phone]    Prescriptions: CARVEDILOL 6.25 MG TABS (CARVEDILOL) Take one tablet by mouth twice a day  #60 x 6   Entered by:   Lisabeth Devoid RN   Authorized by:   Gaylord Shih, MD, [facility]   Signed by:   Lisabeth Devoid RN on 03/18/2010   Method used:   [facility] to        [facility]  [address] [phone] ([facility]ail)       [address] [place], [place]  [address]       Ph: [phone] or [phone]       Fax: [phone]   RxID:   [id number]    Appended Document: wt 206/lexiscan/786.09/wall/mcr no prec. req/saf LMOVM advised to not restart Hercepton per Dr. Lonia Chimera RN

## 2010-03-25 ENCOUNTER — Ambulatory Visit (INDEPENDENT_AMBULATORY_CARE_PROVIDER_SITE_OTHER): Payer: Medicare Other | Admitting: Family Medicine

## 2010-03-25 ENCOUNTER — Encounter: Payer: Self-pay | Admitting: Family Medicine

## 2010-03-25 ENCOUNTER — Ambulatory Visit (INDEPENDENT_AMBULATORY_CARE_PROVIDER_SITE_OTHER)
Admission: RE | Admit: 2010-03-25 | Discharge: 2010-03-25 | Disposition: A | Discharge status: Home / Self Care | Payer: Medicare Other | Source: Ambulatory Visit | Attending: Family Medicine | Admitting: Family Medicine

## 2010-03-25 VITALS — BP 118/70 | Temp 98.9°F | Ht 63.0 in | Wt 205.0 lb

## 2010-03-25 DIAGNOSIS — I10 Essential (primary) hypertension: Secondary | ICD-10-CM

## 2010-03-25 DIAGNOSIS — R51 Headache: Secondary | ICD-10-CM

## 2010-03-25 DIAGNOSIS — C801 Malignant (primary) neoplasm, unspecified: Secondary | ICD-10-CM

## 2010-03-25 DIAGNOSIS — D444 Neoplasm of uncertain behavior of craniopharyngeal duct: Secondary | ICD-10-CM | POA: Insufficient documentation

## 2010-03-25 NOTE — Progress Notes (Signed)
  Subjective:    Patient ID: Shannon [name], female    DOB: Jan 03, 1946, 65 y.o.   MRN: [id number]  HPI  Patient seen with chief complaint of headache for the past 3 weeks. He gives history around January 5 fell and struck right-sided headache on concrete. Had some swelling right frontal region. Did not have any headaches initially and these developed about 2 weeks later. Since then has had daily relatively constant headache. 8/10 severity. Sharp to dull quality. Frequently wakes at night. No history of similar headache. No clear exacerbating features.   patient went to urgent care type clinic yesterday and started on amoxicillin for possible sinusitis. Denies any major sinus congestive symptoms at this time. Denies any fever, chills, nausea, vomiting . Has some mild lightheaded type dizziness. Advil helps headache only slightly.   she has remote history of craniopharyngioma and 2000. 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 Right ductal carcinoma. Patient also has history of melanoma 2006    denies any appetite or weight changes. No focal weakness.   Review of Systems  as per history of present illness. Additionally she denies any chest pain , visual changes , abdominal pain, persistent cough, or a change in her stool habits. No cognitive changes    Objective:   Physical Exam  patient is alert and in no distress. Oriented x3 Pupils are equal round reactive to light. Optic disc are sharp.  Head is atraumatic  Oropharynx is clear Neck is supple no adenopathy Chest clear to auscultation Heart regular rate Extremities no edema Neuro exam cranial nerves all intact. . Cerebellar function normal by finger to nose testing.  strength full throughout. Gait normal.       Assessment & Plan:   #1 new onset headache with prior history of head trauma. 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

## 2010-03-29 ENCOUNTER — Telehealth: Payer: Self-pay | Admitting: Family Medicine

## 2010-03-29 DIAGNOSIS — T753XXA Motion sickness, initial encounter: Secondary | ICD-10-CM

## 2010-03-29 MED ORDER — SCOPOLAMINE 1 MG/3DAYS TD PT72: 1.0000 | MEDICATED_PATCH | TRANSDERMAL | Status: DC

## 2010-03-29 NOTE — Telephone Encounter (Signed)
Going on a cruise and would like to have an anti-nausea / motion sickness patch called into [facility] [place]..... # [phone] °

## 2010-03-29 NOTE — Telephone Encounter (Signed)
Rx sent, pt aware 

## 2010-03-29 NOTE — Telephone Encounter (Signed)
OK to use scoplolamine patch one patch behind ear q 72 hours, disp one box with one refill.

## 2010-03-30 ENCOUNTER — Ambulatory Visit (INDEPENDENT_AMBULATORY_CARE_PROVIDER_SITE_OTHER): Payer: Medicare Other | Admitting: Cardiology

## 2010-03-30 ENCOUNTER — Encounter: Payer: Self-pay | Admitting: Cardiology

## 2010-03-30 DIAGNOSIS — I429 Cardiomyopathy, unspecified: Secondary | ICD-10-CM

## 2010-04-07 ENCOUNTER — Other Ambulatory Visit (HOSPITAL_COMMUNITY): Payer: Self-pay

## 2010-04-07 NOTE — Assessment & Plan Note (Signed)
Summary: f/u  per check out/sf   Visit Type:  Follow-up Primary Provider:  Dr. Caryl Never  CC:  some chest pressure at times. sob. fatigue.Marland Kitchen  History of Present Illness: Mrs Cumpton returns for followup. Myoview demonstated no ischemia. EF at 74% is most likely falsely high but encouraging. She is asx.  Current Medications (verified): 1)  Crestor 20 Mg Tabs (Rosuvastatin Calcium) .... Once Daily 2)  Effexor Xr 75 Mg Xr24h-Cap (Venlafaxine Hcl) .... Once Daily 3)  Nexium 40 Mg Cpdr (Esomeprazole Magnesium) .... Once Daily 4)  Aspirin 81 Mg Tabs (Aspirin) .... Once Daily 5)  Elmiron 100 Mg Caps (Pentosan Polysulfate Sodium) .... Take 1 Tablet By Mouth Two Times A Day 6)  Hydroxyzine Hcl 25 Mg Tabs (Hydroxyzine Hcl) .... Once Daily 7)  Vitamin D 1000 Unit Tabs (Cholecalciferol) .... Once Daily 8)  Centrum Ultra Womens  Tabs (Multiple Vitamins-Minerals) .... Once Daily 9)  Calcium 1500 Mg Tabs (Calcium Carbonate) .... Two Times A Day 10)  Fish Oil 500 Mg Caps (Omega-3 Fatty Acids) .... Two Times A Day 11)  B Complex Vitamins  Caps (B Complex Vitamins) .... Once Daily 12)  Vitamin E 400 Unit Caps (Vitamin E) .... Once Daily 13)  Lorazepam 0.5 Mg Tabs (Lorazepam) .Marland Kitchen.. 1tab At Bedtime 14)  Micardis 80 Mg Tabs (Telmisartan) .... Take 1 Tablet By Mouth Once A Day 15)  Neurontin 300 Mg Caps (Gabapentin) .... Qd 16)  Carvedilol 6.25 Mg Tabs (Carvedilol) .... Take One Tablet By Mouth Twice A Day 17)  Amoxicillin 250 Mg Caps (Amoxicillin) .Marland Kitchen.. 1 Tab 2 Times Daily  Allergies (verified): No Known Drug Allergies  Past History:  Past Medical History: Last updated: 06/15/2009 Interstitial cystitis Hyperlipidemia Hypertension Osteopenia Gastric polyps Depression Menopause 1995 Melanoma 12/06 Pituitary craniopharyngioma 2000 Breast Cancer Ductal carcinoma R  Past Surgical History: Last updated: 02/10/2009 Brain tumor 2000 craniopharyngioma Cholecystectomy  1998  Family History: Last  updated: 08/11/2008 Family History of Arthritis Family History High cholesterol Family History Hypertension Family history prostate cancer, father Family history breast cancer, mother  Social History: Last updated: 08/11/2008 Occupation:  Public librarian Married Never Smoked Alcohol use-no Regular exercise-no  Risk Factors: Exercise: no (08/11/2008)  Risk Factors: Smoking Status: never (08/11/2008)  Clinical Reports Reviewed:  Nuclear Study:  03/16/2010:   Overall Impression   Exercise Capacity: Lexiscan with no exercise. BP Response: Normal blood pressure response. Clinical Symptoms: shortness of breath ECG Impression: No significant ST segment change suggestive of ischemia. Overall Impression: Normal LV systolic function.  Partially reversible apical perfusion defect could represent a small area of ischemia.  Alternatively, could be shifting breast attenuation.  Overall low risk study.    Vital Signs:  Patient profile:   65 year old female Menstrual status:  postmenopausal Height:      64 inches Weight:      208.50 pounds BMI:     35.92 Pulse rate:   80 / minute Resp:     18 per minute BP sitting:   106 / 82  (left arm) Cuff size:   large  Vitals Entered By: Celestia Khat, CMA (March 30, 2010 9:52 AM)   Impression & Recommendations:  Problem # 1:  CARDIOMYOPATHY, SECONDARY (ICD-425.9) Assessment Unchanged We have to assume this is secondary to her chemotherapy. I have advised against her resuming Recepton. Will continue meds and recheck Echo in April. I will see her afterwards. Her updated medication list for this problem includes:    Aspirin 81 Mg Tabs (Aspirin) .Marland KitchenMarland KitchenMarland KitchenMarland Kitchen  Once daily    Micardis 80 Mg Tabs (Telmisartan) .Marland Kitchen... Take 1 tablet by mouth once a day    Carvedilol 6.25 Mg Tabs (Carvedilol) .Marland Kitchen... Take one tablet by mouth twice a day  Problem # 2:  CHEST PAIN UNSPECIFIED (ICD-786.50) Noncardiac Her updated medication list for this problem  includes:    Aspirin 81 Mg Tabs (Aspirin) ..... Once daily    Carvedilol 6.25 Mg Tabs (Carvedilol) .Marland Kitchen... Take one tablet by mouth twice a day  Problem # 3:  HYPERTENSION (ICD-401.9) Assessment: Improved  Her updated medication list for this problem includes:    Aspirin 81 Mg Tabs (Aspirin) ..... Once daily    Micardis 80 Mg Tabs (Telmisartan) .Marland Kitchen... Take 1 tablet by mouth once a day    Carvedilol 6.25 Mg Tabs (Carvedilol) .Marland Kitchen... Take one tablet by mouth twice a day  Patient Instructions: 1)  Your physician recommends that you schedule a follow-up appointment in: 07/28/10 at 9:00 am with Dr. Daleen Squibb 2)  Your physician recommends that you continue on your current medications as directed. Please refer to the Current Medication list given to you today.

## 2010-04-12 ENCOUNTER — Other Ambulatory Visit: Payer: Self-pay | Admitting: Oncology

## 2010-04-12 ENCOUNTER — Encounter (HOSPITAL_BASED_OUTPATIENT_CLINIC_OR_DEPARTMENT_OTHER): Payer: Medicare Other | Admitting: Oncology

## 2010-04-12 DIAGNOSIS — D496 Neoplasm of unspecified behavior of brain: Secondary | ICD-10-CM

## 2010-04-12 DIAGNOSIS — C50319 Malignant neoplasm of lower-inner quadrant of unspecified female breast: Secondary | ICD-10-CM

## 2010-04-12 DIAGNOSIS — Z171 Estrogen receptor negative status [ER-]: Secondary | ICD-10-CM

## 2010-04-12 DIAGNOSIS — Z5112 Encounter for antineoplastic immunotherapy: Secondary | ICD-10-CM

## 2010-04-12 DIAGNOSIS — C50919 Malignant neoplasm of unspecified site of unspecified female breast: Secondary | ICD-10-CM

## 2010-04-12 LAB — CBC WITH DIFFERENTIAL/PLATELET
Basophils Absolute: 0 10*3/uL (ref 0.0–0.1)
Eosinophils Absolute: 0.4 10*3/uL (ref 0.0–0.5)
HGB: 12.3 g/dL (ref 11.6–15.9)
LYMPH%: 24.3 % (ref 14.0–49.7)
MCHC: 32.5 g/dL (ref 31.5–36.0)
MCV: 89.2 fL (ref 79.5–101.0)
MONO#: 0.6 10*3/uL (ref 0.1–0.9)
MONO%: 8.8 % (ref 0.0–14.0)
NEUT#: 3.8 10*3/uL (ref 1.5–6.5)
Platelets: 207 10*3/uL (ref 145–400)
RBC: 4.24 10*6/uL (ref 3.70–5.45)
WBC: 6.3 10*3/uL (ref 3.9–10.3)
nRBC: 0 % (ref 0–0)

## 2010-04-14 LAB — DIFFERENTIAL
Basophils Absolute: 0.2 10*3/uL — ABNORMAL HIGH (ref 0.0–0.1)
Basophils Relative: 2 % — ABNORMAL HIGH (ref 0–1)
Eosinophils Absolute: 1.6 10*3/uL — ABNORMAL HIGH (ref 0.0–0.7)
Neutro Abs: 4.8 10*3/uL (ref 1.7–7.7)
Neutrophils Relative %: 55 % (ref 43–77)

## 2010-04-14 LAB — URINALYSIS, ROUTINE W REFLEX MICROSCOPIC
Nitrite: NEGATIVE
Protein, ur: NEGATIVE mg/dL
Specific Gravity, Urine: 1.005 (ref 1.005–1.030)
Urobilinogen, UA: 0.2 mg/dL (ref 0.0–1.0)

## 2010-04-14 LAB — COMPREHENSIVE METABOLIC PANEL
ALT: 29 U/L (ref 0–35)
Alkaline Phosphatase: 113 U/L (ref 39–117)
BUN: 8 mg/dL (ref 6–23)
CO2: 28 mEq/L (ref 19–32)
GFR calc non Af Amer: >60 mL/min (ref >60)
Glucose, Bld: 102 mg/dL — ABNORMAL HIGH (ref 70–99)
Potassium: 3.5 mEq/L (ref 3.5–5.1)
Sodium: 141 mEq/L (ref 135–145)

## 2010-04-14 LAB — CBC
HCT: 30.7 % — ABNORMAL LOW (ref 36.0–46.0)
Hemoglobin: 9.9 g/dL — ABNORMAL LOW (ref 12.0–15.0)
MCHC: 32.2 g/dL (ref 30.0–36.0)
MCV: 93.9 fL (ref 78.0–100.0)
RDW: 17.3 % — ABNORMAL HIGH (ref 11.5–15.5)
WBC: 8.8 10*3/uL (ref 4.0–10.5)

## 2010-04-15 LAB — ABO/RH: ABO/RH(D): O POS

## 2010-04-15 LAB — CROSSMATCH: Antibody Screen: NEGATIVE

## 2010-04-19 LAB — POCT I-STAT, CHEM 8
Calcium, Ion: 1.29 mmol/L (ref 1.12–1.32)
HCT: 37 % (ref 36.0–46.0)
Sodium: 142 mEq/L (ref 135–145)
TCO2: 29 mmol/L (ref 0–100)

## 2010-04-19 LAB — POCT HEMOGLOBIN-HEMACUE: Hemoglobin: 11.9 g/dL — ABNORMAL LOW (ref 12.0–15.0)

## 2010-04-20 LAB — BASIC METABOLIC PANEL
BUN: 10 mg/dL (ref 6–23)
Calcium: 9.2 mg/dL (ref 8.4–10.5)
Creatinine, Ser: 0.87 mg/dL (ref 0.4–1.2)
GFR calc non Af Amer: >60 mL/min (ref >60)
Glucose, Bld: 114 mg/dL — ABNORMAL HIGH (ref 70–99)

## 2010-04-29 DIAGNOSIS — Z452 Encounter for adjustment and management of vascular access device: Secondary | ICD-10-CM

## 2010-05-02 ENCOUNTER — Telehealth: Payer: Self-pay | Admitting: Gastroenterology

## 2010-05-02 NOTE — Telephone Encounter (Signed)
Pt is complaining of reflux and hurting in her esophagus for a couple of weeks. It happens when she eats and wakes her up at night. Pt requests to be seen. Pt scheduled to see Willette Cluster NP 05/03/10@10am . Pt aware of appt date and time.

## 2010-05-03 ENCOUNTER — Ambulatory Visit (INDEPENDENT_AMBULATORY_CARE_PROVIDER_SITE_OTHER): Payer: Medicare Other | Admitting: Nurse Practitioner

## 2010-05-03 ENCOUNTER — Ambulatory Visit: Payer: Medicare Other | Admitting: Nurse Practitioner

## 2010-05-03 ENCOUNTER — Encounter: Payer: Self-pay | Admitting: Nurse Practitioner

## 2010-05-03 DIAGNOSIS — R079 Chest pain, unspecified: Secondary | ICD-10-CM

## 2010-05-03 DIAGNOSIS — K219 Gastro-esophageal reflux disease without esophagitis: Secondary | ICD-10-CM

## 2010-05-03 MED ORDER — NYSTATIN 100000 UNIT/ML MT SUSP: OROMUCOSAL | Status: DC

## 2010-05-03 MED ORDER — SUCRALFATE 1 GM/10ML PO SUSP: 1.0000 g | Freq: Four times a day (QID) | ORAL | Status: DC

## 2010-05-03 NOTE — Progress Notes (Signed)
05/03/2010 Shannon [name] [id number] 10/04/1945   History of Present Illness:  This is a 65 year old female with a history of melanoma (six years ago) and cardiomyopaty secondary to chemotherapy for breast cancer diagnosed in March of last year. Patient is followed by Dr. Arlyce Dice for colorectal cancer screenings and history of dysphasia for which she underwent empirical esophageal dilation in April of last year. Patient called in yesterday with complaints of GERD.   Fifteen or so years ago she was diagnosed at [facility] with diffuse esophageal spsams. She improved with Imipramine and PPIs. Patient has remained on a PPI and had been doing fine until 3 weeks ago when she developed recurrent symptoms. She complains of heartburn and belching. She mentions esophageal "pain" which radiates through to her back.  Has taken Tum and Bismuth but doesn't seem to help. No dysphagia. Weight stable. She did have antibiotics two months ago for sinus infection.  No SOB. Followed routinely by Dr. Daleen Squibb for her history of cardiomyopathy. She saw him late February for "chest pressure" and was started on Coreg. Because of ongoing chemotherapy she gets routine echocardiograms. Her last Myoview was negative for ischemia.  Past Medical History  Diagnosis Date  . CARDIOMYOPATHY, SECONDARY 03/08/2010  . CHEST PAIN UNSPECIFIED 03/08/2010  . DEPRESSION 08/11/2008  . DYSPNEA ON EXERTION 02/10/2009  . GERD 08/11/2008  . HYPERLIPIDEMIA 08/11/2008  . HYPERTENSION 08/11/2008  . LIPOMA 02/10/2009  . OSTEOPENIA 08/11/2008  . PREDIABETES 06/15/2009  . 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 Breast cancer   . Carpal tunnel syndrome of left wrist   . Neuropathy   . Craniopharyngioma 2000  . Melanoma 2006    neck  . Interstitial cystitis    Past Surgical History  Procedure Date  . Cholecystectomy   . Carpal tunnel release     L wrist  . Elbow surgery     left  . Breast lumpectomy     right  . Tubal ligation 1997  . Lipoma excision  2011    right leg  . Portacath placement may 2011  . Craniotomy for tumor 2000  . Tonsillectomy 1958  . Melanoma excision     reports that she has never smoked. She has never used smokeless tobacco. She reports that she drinks alcohol. She reports that she does not use illicit drugs. family history includes Breast cancer in her mother; Cancer in her mother; Heart disease in her father; Lung cancer in her mother; Prostate cancer in her brother and father; Stomach cancer in her maternal aunt; and Uterine cancer in her maternal aunt.  There is no history of Colon cancer. No Known Allergies      Outpatient Encounter Prescriptions as of 05/03/2010  Medication Sig Dispense Refill  . b complex vitamins tablet Take 1 tablet by mouth daily.        . Calcium Carbonate-Vitamin D (CALCIUM-VITAMIN D) 600-200 MG-UNIT CAPS Take 1 tablet by mouth 2 (two) times daily.        . carvedilol (COREG) 6.25 MG tablet Take 6.25 mg by mouth 2 (two) times daily with a meal.        . Cholecalciferol (VITAMIN D) 1000 UNITS capsule Take 1,000 Units by mouth daily.        Marland Kitchen esomeprazole (NEXIUM) 40 MG capsule Take 40 mg by mouth daily before breakfast.        . gabapentin (NEURONTIN) 300 MG capsule Take 300 mg by mouth daily.        Marland Kitchen  Glucosamine-Chondroitin (OSTEO BI-FLEX REGULAR STRENGTH PO) Take 1 tablet by mouth daily.        . hydrOXYzine (ATARAX) 25 MG tablet Take 25 mg by mouth daily.        Marland Kitchen LORazepam (ATIVAN) 0.5 MG tablet Take 0.5 mg by mouth at bedtime.        . Multiple Vitamins-Minerals (CENTRUM SILVER PO) Take by mouth.        . Omega-3 Fatty Acids (FISH OIL) 500 MG CAPS Take 1 capsule by mouth 2 (two) times daily.        . pentosan polysulfate (ELMIRON) 100 MG capsule Take 100 mg by mouth 2 (two) times daily.        . rosuvastatin (CRESTOR) 20 MG tablet Take 20 mg by mouth daily.        Marland Kitchen telmisartan (MICARDIS) 80 MG tablet Take 80 mg by mouth daily.        Marland Kitchen venlafaxine (EFFEXOR-XR) 75 MG 24 hr capsule  Take 75 mg by mouth daily.        . vitamin E 400 UNIT capsule Take 400 Units by mouth daily.        Marland Kitchen aspirin 81 MG tablet Take 81 mg by mouth daily.        Marland Kitchen selenium 50 MCG TABS Take 50 mcg by mouth daily.        . sucralfate (CARAFATE) 1 GM/10ML suspension Take 10 mLs (1 g total) by mouth 4 (four) times daily.  40 mL  1  . DISCONTD: amoxicillin (AMOXIL) 875 MG tablet Take 875 mg by mouth 2 (two) times daily. For 10 days       . DISCONTD: calcium & magnesium carbonates (MYLANTA) 311-232 MG per tablet Take 1 tablet by mouth daily.        Marland Kitchen DISCONTD: scopolamine (TRANSDERM-SCOP) 1.5 MG Place 1 patch onto the skin every 3 (three) days.  4 patch  1    No Known Allergies  ROS: All other systems reviewed and negative except where noted in History of Present Illness.  Physical Exam: General: Well developed female in no acute distress Head: Normocephalic and atraumatic Eyes:  sclerae anicteric,conjunctive pink. Ears: Normal auditory acuity Mouth: No deformity or lesions Neck: Supple, no masses.  Lungs: Clear throughout to auscultation Heart: Regular rate and rhythm; no murmurs heard Abdomen: Soft, obese, non tender and non distended. No masses or hepatomegaly noted. Normal Bowel sounds Rectal:  Not done. Musculoskeletal: Symmetrical with no gross deformities  Skin: No lesions on visible extremities Extremities: No edema or deformities noted Neurological: Alert oriented x 4, grossly nonfocal Cervical Nodes:  No significant cervical adenopathy Psychological:  Alert and cooperative. Normal mood and affect   Assessment and Plan: CHEST PAIN UNSPECIFIED Two-three week history of chest pain following meals. No dysphagia. Recent antibiotics so candida esophagitis is possibility. Trial of Nystatin swish and swallow, continue PPI, add Carafate. She will follow up with [place] in 2-3 weeks. If no improvement then she will need an EGD for further evaluation.

## 2010-05-03 NOTE — Patient Instructions (Signed)
We have scheduled the Endoscopy  On 05-17-2010. Directions and brochure provided. We have sent prescriptions for Carafate Liquid and Nystatin Swish and Swallow. Please call us after the 10 days of taking the prescriptions with a progress report. Copy sent to: Dr. Evelena Peat

## 2010-05-05 NOTE — Assessment & Plan Note (Addendum)
Two-three week history of chest pain following meals. No dysphagia. Recent antibiotics so candida esophagitis is possibility. Trial of Nystatin swish and swallow, continue PPI, add Carafate. She will follow up with [place] in 2-3 weeks. If no improvement then she will need an EGD for further evaluation.

## 2010-05-09 NOTE — Progress Notes (Signed)
I agree with the plans outline in this note

## 2010-05-17 ENCOUNTER — Other Ambulatory Visit: Payer: Self-pay | Admitting: Gastroenterology

## 2010-05-17 ENCOUNTER — Encounter: Payer: Self-pay | Admitting: Gastroenterology

## 2010-05-17 ENCOUNTER — Ambulatory Visit (AMBULATORY_SURGERY_CENTER): Payer: Medicare Other | Admitting: Gastroenterology

## 2010-05-17 DIAGNOSIS — K2990 Gastroduodenitis, unspecified, without bleeding: Secondary | ICD-10-CM

## 2010-05-17 DIAGNOSIS — R1013 Epigastric pain: Secondary | ICD-10-CM

## 2010-05-17 DIAGNOSIS — K219 Gastro-esophageal reflux disease without esophagitis: Secondary | ICD-10-CM

## 2010-05-17 DIAGNOSIS — K2970 Gastritis, unspecified, without bleeding: Secondary | ICD-10-CM

## 2010-05-17 DIAGNOSIS — K2940 Chronic atrophic gastritis without bleeding: Secondary | ICD-10-CM

## 2010-05-17 DIAGNOSIS — R079 Chest pain, unspecified: Secondary | ICD-10-CM

## 2010-05-17 MED ORDER — SODIUM CHLORIDE 0.9 % IV SOLN: 500.0000 mL | INTRAVENOUS | Status: DC

## 2010-05-17 NOTE — Patient Instructions (Signed)
Mild gastritis was found in your stomach.   Resume all of your meds. And please take your acid inhibitor as directed.   You might want to cut back on acidic foods and drinks.   If you have any questions, please call us at [phone].  Thank-you.

## 2010-05-18 ENCOUNTER — Telehealth: Payer: Self-pay | Admitting: *Deleted

## 2010-05-18 NOTE — Telephone Encounter (Signed)

## 2010-06-10 ENCOUNTER — Encounter (HOSPITAL_BASED_OUTPATIENT_CLINIC_OR_DEPARTMENT_OTHER): Payer: Medicare Other | Admitting: Oncology

## 2010-06-10 DIAGNOSIS — C50319 Malignant neoplasm of lower-inner quadrant of unspecified female breast: Secondary | ICD-10-CM

## 2010-06-10 DIAGNOSIS — Z452 Encounter for adjustment and management of vascular access device: Secondary | ICD-10-CM

## 2010-06-15 ENCOUNTER — Encounter: Payer: Self-pay | Admitting: Gastroenterology

## 2010-06-15 ENCOUNTER — Telehealth: Payer: Self-pay

## 2010-06-15 ENCOUNTER — Ambulatory Visit (INDEPENDENT_AMBULATORY_CARE_PROVIDER_SITE_OTHER): Payer: Medicare Other | Admitting: Gastroenterology

## 2010-06-15 ENCOUNTER — Other Ambulatory Visit (INDEPENDENT_AMBULATORY_CARE_PROVIDER_SITE_OTHER): Payer: Medicare Other

## 2010-06-15 VITALS — BP 120/84 | HR 80 | Ht 64.0 in | Wt 209.0 lb

## 2010-06-15 DIAGNOSIS — R300 Dysuria: Secondary | ICD-10-CM

## 2010-06-15 DIAGNOSIS — N3010 Interstitial cystitis (chronic) without hematuria: Secondary | ICD-10-CM | POA: Insufficient documentation

## 2010-06-15 DIAGNOSIS — R079 Chest pain, unspecified: Secondary | ICD-10-CM

## 2010-06-15 DIAGNOSIS — K219 Gastro-esophageal reflux disease without esophagitis: Secondary | ICD-10-CM

## 2010-06-15 LAB — URINALYSIS, ROUTINE W REFLEX MICROSCOPIC
Leukocytes, UA: NEGATIVE
Nitrite: NEGATIVE

## 2010-06-15 MED ORDER — ESOMEPRAZOLE MAGNESIUM 40 MG PO CPDR: 40.0000 mg | DELAYED_RELEASE_CAPSULE | Freq: Every day | ORAL | Status: DC

## 2010-06-15 MED ORDER — SUCRALFATE 1 G PO TABS: 1.0000 g | ORAL_TABLET | Freq: Four times a day (QID) | ORAL | Status: DC

## 2010-06-15 NOTE — Telephone Encounter (Signed)
Pt aware of lab results per Dr. Arlyce Dice.

## 2010-06-15 NOTE — Progress Notes (Signed)
Shannon [name] has returned following upper endoscopy. Exam was unrevealing. She complains of occasional episodes of pyrosis accompanied by chest discomfort. She felt better when she had been taking Carafate which she has run out of.  The patient complains of mild low back pain which she has associated with a urinary tract infection. She has a history of interstitial cystitis.

## 2010-06-15 NOTE — Assessment & Plan Note (Signed)
Chest discomfort on today's visit clearly is related to reflux.  Recommendations #1 add antacids  when necessary. The patient will add back Carafate at her discretion. #2 continue Nexium

## 2010-06-15 NOTE — Telephone Encounter (Signed)
Message copied by Chrystie Nose on Wed Jun 15, 2010  1:44 PM ------      Message from: Melvia Heaps MD      Created: Wed Jun 15, 2010  1:24 PM       Please inform the patient that lab work was normal and to continue current plan of action

## 2010-06-15 NOTE — Assessment & Plan Note (Signed)
Plan to continue Nexium. Carafate was reordered. Antacids when necessary.

## 2010-06-15 NOTE — Patient Instructions (Signed)
You will go to the basement for urine test today

## 2010-06-15 NOTE — Progress Notes (Signed)
Quick Note:  Please inform the patient that lab work was normal and to continue current plan of action ______ 

## 2010-06-15 NOTE — Assessment & Plan Note (Signed)
Plan urinalysis because of low back pain

## 2010-06-17 ENCOUNTER — Encounter (INDEPENDENT_AMBULATORY_CARE_PROVIDER_SITE_OTHER): Payer: Self-pay | Admitting: Surgery

## 2010-06-23 ENCOUNTER — Other Ambulatory Visit: Payer: Self-pay | Admitting: *Deleted

## 2010-06-23 MED ORDER — VENLAFAXINE HCL ER 75 MG PO CP24: 75.0000 mg | ORAL_CAPSULE | Freq: Every day | ORAL | Status: DC

## 2010-07-19 ENCOUNTER — Encounter (HOSPITAL_BASED_OUTPATIENT_CLINIC_OR_DEPARTMENT_OTHER): Payer: Medicare Other | Admitting: Oncology

## 2010-07-19 ENCOUNTER — Other Ambulatory Visit: Payer: Self-pay | Admitting: Oncology

## 2010-07-19 DIAGNOSIS — Z171 Estrogen receptor negative status [ER-]: Secondary | ICD-10-CM

## 2010-07-19 DIAGNOSIS — C50319 Malignant neoplasm of lower-inner quadrant of unspecified female breast: Secondary | ICD-10-CM

## 2010-07-19 LAB — COMPREHENSIVE METABOLIC PANEL
ALT: 26 U/L (ref 0–35)
AST: 26 U/L (ref 0–37)
Albumin: 4.2 g/dL (ref 3.5–5.2)
CO2: 26 mEq/L (ref 19–32)
Calcium: 8.8 mg/dL (ref 8.4–10.5)
Chloride: 105 mEq/L (ref 96–112)
Creatinine, Ser: 1.01 mg/dL (ref 0.50–1.10)
Potassium: 4.3 mEq/L (ref 3.5–5.3)
Sodium: 140 mEq/L (ref 135–145)
Total Protein: 6.4 g/dL (ref 6.0–8.3)

## 2010-07-19 LAB — CBC WITH DIFFERENTIAL/PLATELET
BASO%: 0.6 % (ref 0.0–2.0)
EOS%: 6.5 % (ref 0.0–7.0)
HCT: 31.9 % — ABNORMAL LOW (ref 34.8–46.6)
MCH: 30 pg (ref 25.1–34.0)
MCHC: 33.7 g/dL (ref 31.5–36.0)
MONO#: 0.4 10*3/uL (ref 0.1–0.9)
NEUT%: 61.8 % (ref 38.4–76.8)
RDW: 13.9 % (ref 11.2–14.5)
WBC: 4.8 10*3/uL (ref 3.9–10.3)
lymph#: 1.1 10*3/uL (ref 0.9–3.3)

## 2010-07-19 LAB — CANCER ANTIGEN 27.29: CA 27.29: 15 U/mL (ref 0–39)

## 2010-07-28 ENCOUNTER — Other Ambulatory Visit: Payer: Self-pay | Admitting: Oncology

## 2010-07-28 ENCOUNTER — Encounter: Payer: Self-pay | Admitting: Cardiology

## 2010-07-28 ENCOUNTER — Encounter (HOSPITAL_BASED_OUTPATIENT_CLINIC_OR_DEPARTMENT_OTHER): Payer: Medicare Other | Admitting: Oncology

## 2010-07-28 ENCOUNTER — Ambulatory Visit (INDEPENDENT_AMBULATORY_CARE_PROVIDER_SITE_OTHER): Payer: Medicare Other | Admitting: Cardiology

## 2010-07-28 VITALS — BP 115/80 | HR 76 | Ht 64.0 in | Wt 211.4 lb

## 2010-07-28 DIAGNOSIS — C50319 Malignant neoplasm of lower-inner quadrant of unspecified female breast: Secondary | ICD-10-CM

## 2010-07-28 DIAGNOSIS — I429 Cardiomyopathy, unspecified: Secondary | ICD-10-CM

## 2010-07-28 DIAGNOSIS — Z9889 Other specified postprocedural states: Secondary | ICD-10-CM

## 2010-07-28 DIAGNOSIS — C50919 Malignant neoplasm of unspecified site of unspecified female breast: Secondary | ICD-10-CM

## 2010-07-28 DIAGNOSIS — Z171 Estrogen receptor negative status [ER-]: Secondary | ICD-10-CM

## 2010-07-28 NOTE — Progress Notes (Signed)
HPI Shannon [name] returns today for evaluation and management of her chemotherapy-induced cardiomyopathy. She is totally asymptomatic with no significant shortness of breath, orthopnea, PND, edema or palpitations. Her last  Echocardiogram that in January showed an ejection fraction of 45-50%. She was supposed to have an echocardiogram in April but did not have it done. Medications reviewed and she is compliant. She is on beta blocker, ARB, aspirin and statin.  He really wants to go back on Herceptin. I discussed this with Dr. Jens Som, one of my partners, and we both agreed that this would not be a wise decision. She accepts this. Past Medical History  Diagnosis Date  . CARDIOMYOPATHY, SECONDARY 03/08/2010  . CHEST PAIN UNSPECIFIED 03/08/2010  . DYSPNEA ON EXERTION 02/10/2009  . GERD 08/11/2008  . HYPERLIPIDEMIA 08/11/2008  . HYPERTENSION 08/11/2008  . LIPOMA 02/10/2009  . OSTEOPENIA 08/11/2008  . PREDIABETES 06/15/2009  . 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 Breast cancer   . Carpal tunnel syndrome of left wrist   . Neuropathy   . Craniopharyngioma 2000  . Melanoma 2006    neck  . Interstitial cystitis   . Brain tumor   . DEPRESSION 08/11/2008  . Neuropathy     Past Surgical History  Procedure Date  . Cholecystectomy   . Carpal tunnel release     L wrist  . Elbow surgery     left  . Breast lumpectomy     right  . Tubal ligation 1997  . Lipoma excision 2011    right leg  . Portacath placement may 2011  . Craniotomy for tumor 2000  . Tonsillectomy 1958  . Melanoma excision   . Porta cath     Family History  Problem Relation Age of Onset  . Breast cancer Mother   . Lung cancer Mother   . Cancer Mother     sarcoma  . Colon cancer Neg Hx   . Prostate cancer Father   . Heart disease Father   . Cancer Father   . Prostate cancer Brother   . Cancer Brother     PROSTATE  . Stomach cancer Maternal Aunt   . Uterine cancer Maternal Aunt     History   Social History  .  Marital Status: Married    Spouse Name: N/A    Number of Children: 3  . Years of Education: N/A   Occupational History  . boutique owner    Social History Main Topics  . Smoking status: Never Smoker   . Smokeless tobacco: Never Used  . Alcohol Use: Yes     rarely  . Drug Use: No  . Sexually Active: Not on file   Other Topics Concern  . Not on file   Social History Narrative  . No narrative on file    No Known Allergies  Current Outpatient Prescriptions  Medication Sig Dispense Refill  . aspirin 81 MG tablet Take 81 mg by mouth daily.        Marland Kitchen b complex vitamins tablet Take 1 tablet by mouth daily.        . Calcium Carbonate-Vitamin D (CALCIUM-VITAMIN D) 600-200 MG-UNIT CAPS Take 1 tablet by mouth 2 (two) times daily.        . carvedilol (COREG) 6.25 MG tablet Take 6.25 mg by mouth 2 (two) times daily with a meal.        . Cholecalciferol (VITAMIN D) 1000 UNITS capsule Take 1,000 Units by mouth daily.        Marland Kitchen  esomeprazole (NEXIUM) 40 MG capsule Take 1 capsule (40 mg total) by mouth daily before breakfast.  30 capsule  5  . gabapentin (NEURONTIN) 300 MG capsule Take 300 mg by mouth daily.        . hydrOXYzine (ATARAX) 25 MG tablet Take 25 mg by mouth daily.        Marland Kitchen LORazepam (ATIVAN) 0.5 MG tablet Take 0.5 mg by mouth at bedtime.        . Multiple Vitamins-Minerals (CENTRUM SILVER PO) Take by mouth.        . Omega-3 Fatty Acids (FISH OIL) 500 MG CAPS Take 1 capsule by mouth 2 (two) times daily.        . pentosan polysulfate (ELMIRON) 100 MG capsule Take 100 mg by mouth 4 (four) times daily.       . rosuvastatin (CRESTOR) 20 MG tablet Take 20 mg by mouth daily.        . sucralfate (CARAFATE) 1 G tablet Take 1 g by mouth as needed.        Marland Kitchen telmisartan (MICARDIS) 80 MG tablet Take 40 mg by mouth daily.       Marland Kitchen venlafaxine (EFFEXOR-XR) 75 MG 24 hr capsule Take 1 capsule (75 mg total) by mouth daily.  30 capsule  6  . vitamin E 400 UNIT capsule Take 400 Units by mouth daily.         Marland Kitchen DISCONTD: sucralfate (CARAFATE) 1 G tablet Take 1 tablet (1 g total) by mouth 4 (four) times daily.  120 tablet  1  . DISCONTD: Glucosamine-Chondroitin (OSTEO BI-FLEX REGULAR STRENGTH PO) Take 1 tablet by mouth daily.        Marland Kitchen DISCONTD: nystatin (MYCOSTATIN) 100000 UNIT/ML suspension Take 5ml 4 times daily x 10 days  200 mL  0   Current Facility-Administered Medications  Medication Dose Route Frequency Provider Last Rate Last Dose  . 0.9 %  sodium chloride infusion  500 mL Intravenous Continuous Louis Meckel, MD        ROS Negative other than HPI.   PE General Appearance: well developed, well nourished in no acute distress, obese HEENT: symmetrical face, PERRLA, good dentition  Neck: no JVD, thyromegaly, or adenopathy, trachea midline Chest: symmetric without deformity Cardiac: PMI non-displaced, RRR, normal S1, S2, no gallop or murmur Lung: clear to ausculation and percussion Vascular: all pulses full without bruits  Abdominal: nondistended, nontender, good bowel sounds, no HSM, no bruits Extremities: no cyanosis, clubbing or edema, no sign of DVT, no varicosities  Skin: normal color, no rashes Neuro: alert and oriented x 3, non-focal Pysch: normal affect Filed Vitals:   07/28/10 0852  BP: 115/80  Pulse: 76  Height: 5\' 4"  (1.626 m)  Weight: 211 lb 6.4 oz (95.89 kg)    EKG  Labs and Studies Reviewed.   Lab Results  Component Value Date   WBC 8.8 10/05/2009   HGB 10.8* 07/19/2010   HCT 31.9* 07/19/2010   MCV 89.0 07/19/2010   PLT 210 07/19/2010      Chemistry      Component Value Date/Time   NA 140 07/19/2010 0950   K 4.3 07/19/2010 0950   CL 105 07/19/2010 0950   CO2 26 07/19/2010 0950   BUN 16 07/19/2010 0950   CREATININE 1.01 07/19/2010 0950      Component Value Date/Time   CALCIUM 8.8 07/19/2010 0950   ALKPHOS 96 07/19/2010 0950   AST 26 07/19/2010 0950   ALT 26 07/19/2010 0950  BILITOT 0.3 07/19/2010 0950       Lab Results  Component Value Date   CHOL  133 06/09/2009   CHOL 204* 08/11/2008   Lab Results  Component Value Date   HDL 38.60* 06/09/2009   HDL 46.30 08/11/2008   Lab Results  Component Value Date   LDLCALC 62 06/09/2009   Lab Results  Component Value Date   TRIG 164.0* 06/09/2009   TRIG 155.0* 08/11/2008   Lab Results  Component Value Date   CHOLHDL 3 06/09/2009   CHOLHDL 4 08/11/2008   No results found for this basename: HGBA1C   Lab Results  Component Value Date   ALT 26 07/19/2010   AST 26 07/19/2010   ALKPHOS 96 07/19/2010   BILITOT 0.3 07/19/2010   No results found for this basename: TSH

## 2010-07-28 NOTE — Patient Instructions (Addendum)
Your physician has requested that you have an echocardiogram. Echocardiography is a painless test that uses sound waves to create images of your heart. It provides your doctor with information about the size and shape of your heart and how well your heart's chambers and valves are working. This procedure takes approximately one hour. There are no restrictions for this procedure.  Your physician recommends that you schedule a follow-up appointment in: 6 months with Dr. Daleen Squibb

## 2010-07-28 NOTE — Assessment & Plan Note (Signed)
We'll repeat echocardiogram. We will pray for an improvement in her ejection fraction. Advised her not to go back on Herceptin. Continue beta blocker and ARB as well as low-dose aspirin. We will reevaluate her in 6 months.

## 2010-08-04 ENCOUNTER — Ambulatory Visit
Admission: RE | Admit: 2010-08-04 | Discharge: 2010-08-04 | Disposition: A | Discharge status: Home / Self Care | Payer: Medicare Other | Source: Ambulatory Visit | Attending: Oncology | Admitting: Oncology

## 2010-08-04 DIAGNOSIS — C50919 Malignant neoplasm of unspecified site of unspecified female breast: Secondary | ICD-10-CM

## 2010-08-09 ENCOUNTER — Ambulatory Visit (HOSPITAL_COMMUNITY): Payer: Medicare Other | Attending: Cardiology | Admitting: Radiology

## 2010-08-09 DIAGNOSIS — E669 Obesity, unspecified: Secondary | ICD-10-CM | POA: Insufficient documentation

## 2010-08-09 DIAGNOSIS — E785 Hyperlipidemia, unspecified: Secondary | ICD-10-CM | POA: Insufficient documentation

## 2010-08-09 DIAGNOSIS — I429 Cardiomyopathy, unspecified: Secondary | ICD-10-CM

## 2010-08-09 DIAGNOSIS — I10 Essential (primary) hypertension: Secondary | ICD-10-CM | POA: Insufficient documentation

## 2010-08-09 DIAGNOSIS — R079 Chest pain, unspecified: Secondary | ICD-10-CM | POA: Insufficient documentation

## 2010-08-09 DIAGNOSIS — I428 Other cardiomyopathies: Secondary | ICD-10-CM

## 2010-08-09 DIAGNOSIS — I079 Rheumatic tricuspid valve disease, unspecified: Secondary | ICD-10-CM | POA: Insufficient documentation

## 2010-08-29 ENCOUNTER — Encounter (HOSPITAL_BASED_OUTPATIENT_CLINIC_OR_DEPARTMENT_OTHER): Payer: Medicare Other | Admitting: Oncology

## 2010-08-29 DIAGNOSIS — Z171 Estrogen receptor negative status [ER-]: Secondary | ICD-10-CM

## 2010-08-29 DIAGNOSIS — C50319 Malignant neoplasm of lower-inner quadrant of unspecified female breast: Secondary | ICD-10-CM

## 2010-08-29 DIAGNOSIS — Z452 Encounter for adjustment and management of vascular access device: Secondary | ICD-10-CM

## 2010-09-07 ENCOUNTER — Emergency Department (HOSPITAL_BASED_OUTPATIENT_CLINIC_OR_DEPARTMENT_OTHER)
Admission: EM | Admit: 2010-09-07 | Discharge: 2010-09-07 | Disposition: A | Discharge status: Home / Self Care | Payer: Medicare Other | Attending: Emergency Medicine | Admitting: Emergency Medicine

## 2010-09-07 ENCOUNTER — Encounter (HOSPITAL_BASED_OUTPATIENT_CLINIC_OR_DEPARTMENT_OTHER): Payer: Self-pay | Admitting: *Deleted

## 2010-09-07 DIAGNOSIS — L0390 Cellulitis, unspecified: Secondary | ICD-10-CM

## 2010-09-07 DIAGNOSIS — R509 Fever, unspecified: Secondary | ICD-10-CM | POA: Insufficient documentation

## 2010-09-07 DIAGNOSIS — L02419 Cutaneous abscess of limb, unspecified: Secondary | ICD-10-CM | POA: Insufficient documentation

## 2010-09-07 DIAGNOSIS — Z79899 Other long term (current) drug therapy: Secondary | ICD-10-CM | POA: Insufficient documentation

## 2010-09-07 LAB — BASIC METABOLIC PANEL
BUN: 12 mg/dL (ref 6–23)
Chloride: 98 mEq/L (ref 96–112)
GFR calc Af Amer: >60 mL/min (ref >60)
Potassium: 3.8 mEq/L (ref 3.5–5.1)

## 2010-09-07 LAB — CBC
HCT: 33.6 % — ABNORMAL LOW (ref 36.0–46.0)
Hemoglobin: 11.1 g/dL — ABNORMAL LOW (ref 12.0–15.0)
RDW: 13.8 % (ref 11.5–15.5)
WBC: 6.1 10*3/uL (ref 4.0–10.5)

## 2010-09-07 MED ORDER — ACETAMINOPHEN 325 MG PO TABS
650.0000 mg | ORAL_TABLET | Freq: Once | ORAL | Status: AC
  Administered 2010-09-07: 650 mg via ORAL
  Filled 2010-09-07: qty 2

## 2010-09-07 MED ORDER — SODIUM CHLORIDE 0.9 % IV SOLN
INTRAVENOUS | Status: DC
  Administered 2010-09-07: 21:00:00 via INTRAVENOUS

## 2010-09-07 MED ORDER — VANCOMYCIN HCL IN DEXTROSE 1-5 GM/200ML-% IV SOLN
1000.0000 mg | Freq: Once | INTRAVENOUS | Status: AC
  Administered 2010-09-07: 1000 mg via INTRAVENOUS
  Filled 2010-09-07: qty 200

## 2010-09-07 MED ORDER — ONDANSETRON HCL 4 MG/2ML IJ SOLN
4.0000 mg | Freq: Once | INTRAMUSCULAR | Status: AC
  Administered 2010-09-07: 4 mg via INTRAVENOUS
  Filled 2010-09-07: qty 2

## 2010-09-07 MED ORDER — DOXYCYCLINE HYCLATE 100 MG PO CAPS: 100.0000 mg | ORAL_CAPSULE | Freq: Two times a day (BID) | ORAL | Status: AC

## 2010-09-07 NOTE — ED Notes (Signed)
Pt c/o redness to left lower leg with fever x 2 days.

## 2010-09-07 NOTE — ED Notes (Signed)
Pt states she cut herself shaving a couple weeks ago, and the wound has taken "a long time to heal". 2 scabs noted to anterior LE with surrounding redness/swelling and warmth. Pt has been running fevers since today with chills. Took tylenol aprpox 7hours ago.

## 2010-09-07 NOTE — ED Notes (Signed)
ABX infused. Dr Deretha Emory reassessed pt. Pt states feels well enough for discharge

## 2010-09-07 NOTE — ED Notes (Signed)
Dr. Zackowski at bedside  

## 2010-10-05 ENCOUNTER — Ambulatory Visit
Admission: RE | Admit: 2010-10-05 | Discharge: 2010-10-05 | Disposition: A | Discharge status: Home / Self Care | Payer: Medicare Other | Source: Ambulatory Visit | Attending: Radiation Oncology | Admitting: Radiation Oncology

## 2010-10-07 ENCOUNTER — Other Ambulatory Visit: Payer: Self-pay | Admitting: *Deleted

## 2010-10-07 MED ORDER — CARVEDILOL 6.25 MG PO TABS: 6.2500 mg | ORAL_TABLET | Freq: Two times a day (BID) | ORAL | Status: DC

## 2010-10-17 ENCOUNTER — Encounter (INDEPENDENT_AMBULATORY_CARE_PROVIDER_SITE_OTHER): Payer: Self-pay | Admitting: Surgery

## 2010-10-20 ENCOUNTER — Encounter (HOSPITAL_BASED_OUTPATIENT_CLINIC_OR_DEPARTMENT_OTHER): Payer: Medicare Other | Admitting: Oncology

## 2010-10-20 ENCOUNTER — Other Ambulatory Visit: Payer: Self-pay | Admitting: Oncology

## 2010-10-20 ENCOUNTER — Ambulatory Visit (HOSPITAL_COMMUNITY)
Admission: RE | Admit: 2010-10-20 | Discharge: 2010-10-20 | Disposition: A | Discharge status: Home / Self Care | Payer: Medicare Other | Source: Ambulatory Visit | Attending: Oncology | Admitting: Oncology

## 2010-10-20 DIAGNOSIS — Z452 Encounter for adjustment and management of vascular access device: Secondary | ICD-10-CM

## 2010-10-20 DIAGNOSIS — C50919 Malignant neoplasm of unspecified site of unspecified female breast: Secondary | ICD-10-CM | POA: Insufficient documentation

## 2010-10-20 DIAGNOSIS — C50319 Malignant neoplasm of lower-inner quadrant of unspecified female breast: Secondary | ICD-10-CM

## 2010-10-20 DIAGNOSIS — D496 Neoplasm of unspecified behavior of brain: Secondary | ICD-10-CM

## 2010-10-20 DIAGNOSIS — Z85820 Personal history of malignant melanoma of skin: Secondary | ICD-10-CM | POA: Insufficient documentation

## 2010-10-20 DIAGNOSIS — Z171 Estrogen receptor negative status [ER-]: Secondary | ICD-10-CM

## 2010-10-20 LAB — CBC WITH DIFFERENTIAL/PLATELET
BASO%: 0.4 % (ref 0.0–2.0)
HCT: 32.4 % — ABNORMAL LOW (ref 34.8–46.6)
MCHC: 33.7 g/dL (ref 31.5–36.0)
MONO#: 0.4 10*3/uL (ref 0.1–0.9)
RBC: 3.65 10*6/uL — ABNORMAL LOW (ref 3.70–5.45)
RDW: 13.8 % (ref 11.2–14.5)
WBC: 4.7 10*3/uL (ref 3.9–10.3)
lymph#: 1.1 10*3/uL (ref 0.9–3.3)

## 2010-10-20 LAB — CMP (CANCER CENTER ONLY)
ALT(SGPT): 31 U/L (ref 10–47)
Albumin: 3.5 g/dL (ref 3.3–5.5)
CO2: 26 mEq/L (ref 18–33)
Calcium: 8.8 mg/dL (ref 8.0–10.3)
Chloride: 108 mEq/L (ref 98–108)
Creat: 0.9 mg/dl (ref 0.6–1.2)
Total Protein: 6.4 g/dL (ref 6.4–8.1)

## 2010-10-20 LAB — CANCER ANTIGEN 27.29: CA 27.29: 17 U/mL (ref 0–39)

## 2010-10-20 MED ORDER — GADOBENATE DIMEGLUMINE 529 MG/ML IV SOLN
20.0000 mL | Freq: Once | INTRAVENOUS | Status: AC | PRN
  Administered 2010-10-20: 19 mL via INTRAVENOUS

## 2010-10-25 ENCOUNTER — Telehealth: Payer: Self-pay | Admitting: *Deleted

## 2010-10-25 NOTE — Telephone Encounter (Signed)
VM received from pt requesting shingles vaccine order be sent to [facility] in [place].  Will fax Rx.  Husband informed

## 2010-10-27 ENCOUNTER — Encounter (HOSPITAL_BASED_OUTPATIENT_CLINIC_OR_DEPARTMENT_OTHER): Payer: Medicare Other | Admitting: Oncology

## 2010-10-27 DIAGNOSIS — Z171 Estrogen receptor negative status [ER-]: Secondary | ICD-10-CM

## 2010-10-27 DIAGNOSIS — C50319 Malignant neoplasm of lower-inner quadrant of unspecified female breast: Secondary | ICD-10-CM

## 2010-10-27 DIAGNOSIS — N3010 Interstitial cystitis (chronic) without hematuria: Secondary | ICD-10-CM

## 2010-11-11 ENCOUNTER — Ambulatory Visit (INDEPENDENT_AMBULATORY_CARE_PROVIDER_SITE_OTHER): Payer: Medicare Other

## 2010-11-11 ENCOUNTER — Telehealth (INDEPENDENT_AMBULATORY_CARE_PROVIDER_SITE_OTHER): Payer: Self-pay | Admitting: General Surgery

## 2010-11-11 DIAGNOSIS — Z23 Encounter for immunization: Secondary | ICD-10-CM

## 2010-11-11 NOTE — Telephone Encounter (Signed)
Patient called and left a voicemail yesterday afternoon. I am awaiting being back in the [place] office to have a chart on patient prior to returning her call.

## 2010-11-14 NOTE — Telephone Encounter (Signed)
Spoke with patient, she was inquiring about PAC removal. I told her I would discuss with Dr Jamey Ripa and get orders for that. Dr Darnelle Catalan gave the okay in his last office note.

## 2010-11-22 ENCOUNTER — Ambulatory Visit (INDEPENDENT_AMBULATORY_CARE_PROVIDER_SITE_OTHER): Payer: Medicare Other | Admitting: Psychiatry

## 2010-11-22 DIAGNOSIS — F0630 Mood disorder due to known physiological condition, unspecified: Secondary | ICD-10-CM

## 2010-11-22 DIAGNOSIS — F4323 Adjustment disorder with mixed anxiety and depressed mood: Secondary | ICD-10-CM

## 2010-11-29 ENCOUNTER — Ambulatory Visit (INDEPENDENT_AMBULATORY_CARE_PROVIDER_SITE_OTHER): Payer: Medicare Other | Admitting: Psychiatry

## 2010-11-29 DIAGNOSIS — F0630 Mood disorder due to known physiological condition, unspecified: Secondary | ICD-10-CM

## 2010-11-29 DIAGNOSIS — F4323 Adjustment disorder with mixed anxiety and depressed mood: Secondary | ICD-10-CM

## 2010-11-30 ENCOUNTER — Encounter (INDEPENDENT_AMBULATORY_CARE_PROVIDER_SITE_OTHER): Payer: Self-pay | Admitting: Surgery

## 2010-11-30 ENCOUNTER — Ambulatory Visit (HOSPITAL_BASED_OUTPATIENT_CLINIC_OR_DEPARTMENT_OTHER)
Admission: RE | Admit: 2010-11-30 | Discharge: 2010-11-30 | Disposition: A | Discharge status: Home / Self Care | Payer: Medicare Other | Source: Ambulatory Visit | Attending: Surgery | Admitting: Surgery

## 2010-11-30 DIAGNOSIS — Z452 Encounter for adjustment and management of vascular access device: Secondary | ICD-10-CM

## 2010-11-30 HISTORY — PX: PORT-A-CATH REMOVAL: SHX5289

## 2010-12-02 NOTE — Op Note (Signed)
  [name], Shannon [name]                 ACCOUNT NO.:  [id number]  MEDICAL RECORD NO.:  [id number]  LOCATION:                                 FACILITY:  PHYSICIAN:  Currie Paris, M.D.DATE OF BIRTH:  August 23, 1945  DATE OF PROCEDURE:  11/30/2010 DATE OF DISCHARGE:                              OPERATIVE REPORT   PREOPERATIVE DIAGNOSIS:  Unneeded Port-A-Cath.  POSTOPERATIVE DIAGNOSIS:  Unneeded Port-A-Cath.  PROCEDURE:  Port-A-Cath removal.  SURGEON:  Currie Paris, MD  ASSISTANT:  Jaclynn Major, PA student.  ANESTHESIA:  Local.  CLINICAL HISTORY:  This is a 65 year old lady who has finished her chemo and needs her port removed.  DESCRIPTION OF PROCEDURE:  I saw the patient in the preoperative area and confirmed the plans.  The area of the port was marked.  The patient was taken to the procedure room and the time-out was done. The area of the port was anesthetized with 1% Xylocaine with epinephrine.  I waited 10 minutes.  The area was then prepped with Betadine.  The healed scar was opened and the Port-A-Cath tubing identified and manipulated a little bit, so that I could free up the port.  I used a knife to open the capsule around the port and manipulated the port out of the wound.  A 3-0 Vicryl figure-of-eight was placed on the tubing tract and the tubing was removed intact.  The suture was tied down.  There was no bleeding and the incision was closed with 3-0 Vicryl and Dermabond.  The patient tolerated the procedure well.  There were no complications. All counts were correct.    Currie Paris, M.D.    CJS/MEDQ  D:  11/30/2010  T:  11/30/2010  Job:  [id number]  Electronically Signed by Cyndia Bent M.D. on 12/02/2010 08:23:45 AM

## 2010-12-06 NOTE — ED Provider Notes (Signed)
History     CSN: [id number] Arrival date & time: 09/07/2010  7:25 PM   None     Chief Complaint  Patient presents with  . Fever  . Rash    (Consider location/radiation/quality/duration/timing/severity/associated sxs/prior treatment) Patient is a 65 y.o. female presenting with fever and rash. The history is provided by the patient.  Fever Primary symptoms of the febrile illness include fever, fatigue, headaches and rash. Primary symptoms do not include visual change, cough, shortness of breath, abdominal pain, nausea, vomiting, diarrhea, dysuria, altered mental status, myalgias or arthralgias. The current episode started 2 days ago. This is a new problem. The problem has been gradually worsening.  The fever began 2 days ago. The fever has been unchanged since its onset. The maximum temperature recorded prior to her arrival was 101 to 101.9 F.  The headache is not associated with photophobia, visual change or weakness.   Associated with: NOTHING.  Rash   REDNESS TO LEFT LOWER LEG AS WELL FOR 2 DAYS. NO SIG PAIN OR RADIATION, BUT WARM. FOLLOWED BY DR Caryl Never.  Past Medical History  Diagnosis Date  . CARDIOMYOPATHY, SECONDARY 03/08/2010  . CHEST PAIN UNSPECIFIED 03/08/2010  . DYSPNEA ON EXERTION 02/10/2009  . GERD 08/11/2008  . HYPERLIPIDEMIA 08/11/2008  . HYPERTENSION 08/11/2008  . LIPOMA 02/10/2009  . OSTEOPENIA 08/11/2008  . PREDIABETES 06/15/2009  . 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 Breast cancer   . Carpal tunnel syndrome of left wrist   . Neuropathy   . Craniopharyngioma 2000  . Melanoma 2006    neck  . Interstitial cystitis   . Brain tumor   . DEPRESSION 08/11/2008  . Neuropathy   . Breast ca   . 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 Cholecystectomy   . Carpal tunnel release     L wrist  . Elbow surgery     left  . Breast lumpectomy 10/07/2009    Right - Dr Jamey Ripa  . Tubal ligation 1997    . Lipoma excision 03/28/2009    right leg  . Portacath placement may 2011  . Craniotomy for tumor 2000  . Tonsillectomy 1958  . Melanoma excision   . Porta cath   . Port-a-cath removal 11/30/2010    Streck    Family History  Problem Relation Age of Onset  . Breast cancer Mother   . Lung cancer Mother   . Cancer Mother     sarcoma  . Colon cancer Neg Hx   . Prostate cancer Father   . Heart disease Father   . Cancer Father   . Prostate cancer Brother   . Cancer Brother     PROSTATE  . Stomach cancer Maternal Aunt   . Uterine cancer Maternal Aunt     History  Substance Use Topics  . Smoking status: Never Smoker   . Smokeless tobacco: Never Used  . Alcohol Use: Yes     rarely    OB History    Grav Para Term Preterm Abortions TAB SAB Ect Mult Living                  Review of Systems  Constitutional: Positive for fever and fatigue.  HENT: Negative for congestion and neck pain.   Eyes: Negative for photophobia, redness and visual disturbance.  Respiratory: Negative for cough and shortness of breath.   Cardiovascular: Negative for chest pain.  Gastrointestinal: Negative for nausea, vomiting, abdominal pain and diarrhea.  Genitourinary: Negative for dysuria and hematuria.  Musculoskeletal: Negative for myalgias and arthralgias.  Skin: Positive for rash. Negative for wound.  Neurological: Positive for headaches. Negative for dizziness, syncope, weakness and numbness.  Hematological: Negative for adenopathy. Does not bruise/bleed easily.  Psychiatric/Behavioral: Negative for altered mental status.    Allergies  Review of patient's allergies indicates no known allergies.  Home Medications   Current Outpatient Rx  Name Route Sig Dispense Refill  . ACETAMINOPHEN ER 650 MG PO TBCR Oral Take 1,300 mg by mouth every 8 (eight) hours as needed. Pain      . ASPIRIN 81 MG PO TABS Oral Take 81 mg by mouth daily.      . B COMPLEX PO TABS Oral Take 1 tablet by mouth  daily.      Marland Kitchen CALCIUM-VITAMIN D 600-200 MG-UNIT PO CAPS Oral Take 1 tablet by mouth 2 (two) times daily.      Marland Kitchen VITAMIN D 1000 UNITS PO CAPS Oral Take 1,000 Units by mouth daily.      Marland Kitchen ESOMEPRAZOLE MAGNESIUM 40 MG PO CPDR Oral Take 1 capsule (40 mg total) by mouth daily before breakfast. 30 capsule 5  . GABAPENTIN 300 MG PO CAPS Oral Take 300 mg by mouth daily.      Marland Kitchen HYDROXYZINE HCL 25 MG PO TABS Oral Take 25 mg by mouth daily.      Marland Kitchen LORAZEPAM 0.5 MG PO TABS Oral Take 0.5 mg by mouth at bedtime.      . CENTRUM SILVER PO Oral Take 1 tablet by mouth daily.     Marland Kitchen FISH OIL 500 MG PO CAPS Oral Take 1 capsule by mouth 2 (two) times daily.      Marland Kitchen PENTOSAN POLYSULFATE SODIUM 100 MG PO CAPS Oral Take 200 mg by mouth 2 (two) times daily.     Marland Kitchen ROSUVASTATIN CALCIUM 20 MG PO TABS Oral Take 20 mg by mouth daily.      . SUCRALFATE 1 G PO TABS Oral Take 1 g by mouth as needed. indigestion    . TELMISARTAN 80 MG PO TABS Oral Take 40 mg by mouth daily.     . VENLAFAXINE HCL 75 MG PO CP24 Oral Take 1 capsule (75 mg total) by mouth daily. 30 capsule 6  . VITAMIN E 400 UNITS PO CAPS Oral Take 400 Units by mouth daily.      Marland Kitchen CARVEDILOL 6.25 MG PO TABS Oral Take 1 tablet (6.25 mg total) by mouth 2 (two) times daily with a meal. 60 tablet 6    BP 118/63  Pulse 70  Temp(Src) 98.7 F (37.1 C) (Oral)  Resp 16  Wt 190 lb (86.183 kg)  SpO2 96%  Physical Exam  Nursing note and vitals reviewed. Constitutional: She is oriented to person, place, and time. She appears well-developed and well-nourished.  HENT:  Head: Normocephalic and atraumatic.  Mouth/Throat: Oropharynx is clear and moist.  Eyes: Conjunctivae and EOM are normal. Pupils are equal, round, and reactive to light.  Neck: Normal range of motion. Neck supple.  Cardiovascular: Normal rate, regular rhythm, normal heart sounds and intact distal pulses.   No murmur heard. Pulmonary/Chest: Effort normal and breath sounds normal. No respiratory distress.   Abdominal: Soft. Bowel sounds are normal. There is no tenderness.  Musculoskeletal: Normal range of motion.       NL EX FOR LEFT LOWER EXTREMITY WITH ANTERIOR REDNESS INCREASED WARMTH AND MILD TENDERNESS TO  PALPATION. NO LESIONS OR SORES OR WOUNDS.   Neurological: She is alert and oriented to person, place, and time. No cranial nerve deficit. She exhibits normal muscle tone. Coordination normal.  Skin: Skin is warm. There is erythema.    ED Course  Procedures (including critical care time)  Labs Reviewed  CBC - Abnormal; Notable for the following:    RBC 3.78 (*)    Hemoglobin 11.1 (*)    HCT 33.6 (*)    All other components within normal limits  BASIC METABOLIC PANEL   Results for orders placed during the hospital encounter of 09/07/10  CBC      Component Value Range   WBC 6.1  4.0 - 10.5 (K/uL)   RBC 3.78 (*) 3.87 - 5.11 (MIL/uL)   Hemoglobin 11.1 (*) 12.0 - 15.0 (g/dL)   HCT 14.7 (*) 82.9 - 46.0 (%)   MCV 88.9  78.0 - 100.0 (fL)   MCH 29.4  26.0 - 34.0 (pg)   MCHC 33.0  30.0 - 36.0 (g/dL)   RDW 56.2  13.0 - 86.5 (%)   Platelets 203  150 - 400 (K/uL)  BASIC METABOLIC PANEL      Component Value Range   Sodium 137  135 - 145 (mEq/L)   Potassium 3.8  3.5 - 5.1 (mEq/L)   Chloride 98  96 - 112 (mEq/L)   CO2 27  19 - 32 (mEq/L)   Glucose, Bld 98  70 - 99 (mg/dL)   BUN 12  6 - 23 (mg/dL)   Creatinine, Ser 7.84  0.50 - 1.10 (mg/dL)   Calcium 9.5  8.4 - 69.6 (mg/dL)   GFR calc non Af Amer >60  >60 (mL/min)   GFR calc Af Amer >60  >60 (mL/min)     No results found.   1. Cellulitis       MDM  FEVER AND REDNESS TO LEFT LOWER LEG CW CELLULITIS. LABS IN ED WIOUT SIG FINDINGS OTHER THAN MILD ANEMIA. IN ED RX WIT IV VANCOMYCIN AND FOLLOWING INFUSION THERE WAS ALREADY SOME IMPROVEMENT IN THE CELLULITIS. OKAY FOR DC HOME ON ORAL ANTIBIOTICS TO COVER MRSA AND CLOSE FOLLOW UP.         Shelda Jakes, MD 12/06/10 [phone]

## 2010-12-13 ENCOUNTER — Ambulatory Visit (INDEPENDENT_AMBULATORY_CARE_PROVIDER_SITE_OTHER): Payer: Medicare Other | Admitting: Psychiatry

## 2010-12-13 DIAGNOSIS — F0630 Mood disorder due to known physiological condition, unspecified: Secondary | ICD-10-CM

## 2010-12-13 DIAGNOSIS — F4323 Adjustment disorder with mixed anxiety and depressed mood: Secondary | ICD-10-CM

## 2010-12-20 ENCOUNTER — Ambulatory Visit (INDEPENDENT_AMBULATORY_CARE_PROVIDER_SITE_OTHER): Payer: Medicare Other | Admitting: Psychiatry

## 2010-12-20 DIAGNOSIS — F4323 Adjustment disorder with mixed anxiety and depressed mood: Secondary | ICD-10-CM

## 2010-12-20 DIAGNOSIS — F0630 Mood disorder due to known physiological condition, unspecified: Secondary | ICD-10-CM

## 2010-12-20 NOTE — Progress Notes (Signed)
Patient seen for individual psychotherapy.  Session focused on strategies to help patient balance her life, decrease her stress levels and nurture self.  Explained Transactional Analysis theory and diagrammed patient's Parent-Adult-Child for her.  Recommended patient ask for her needs to be met and find church she enjoys and restaurants she likes and require family to alternate between their choices and hers. Next appointment 01-03-2011.

## 2011-01-02 DIAGNOSIS — N3010 Interstitial cystitis (chronic) without hematuria: Secondary | ICD-10-CM | POA: Insufficient documentation

## 2011-01-02 DIAGNOSIS — C439 Malignant melanoma of skin, unspecified: Secondary | ICD-10-CM | POA: Insufficient documentation

## 2011-01-03 ENCOUNTER — Ambulatory Visit: Payer: Medicare Other | Admitting: Psychiatry

## 2011-01-03 ENCOUNTER — Encounter: Payer: Self-pay | Admitting: Cardiology

## 2011-01-03 ENCOUNTER — Ambulatory Visit (INDEPENDENT_AMBULATORY_CARE_PROVIDER_SITE_OTHER): Payer: Medicare Other | Admitting: Cardiology

## 2011-01-03 VITALS — BP 124/82 | HR 68 | Ht 64.0 in | Wt 216.0 lb

## 2011-01-03 DIAGNOSIS — R0609 Other forms of dyspnea: Secondary | ICD-10-CM

## 2011-01-03 DIAGNOSIS — I429 Cardiomyopathy, unspecified: Secondary | ICD-10-CM

## 2011-01-03 DIAGNOSIS — R0989 Other specified symptoms and signs involving the circulatory and respiratory systems: Secondary | ICD-10-CM

## 2011-01-03 DIAGNOSIS — E785 Hyperlipidemia, unspecified: Secondary | ICD-10-CM

## 2011-01-03 DIAGNOSIS — I10 Essential (primary) hypertension: Secondary | ICD-10-CM

## 2011-01-03 NOTE — Progress Notes (Signed)
HPI Shannon [name] returns today for her chemotherapy induced cardiomyopathy. Her last echocardiogram in July of this year showed total recovery of her left ventricular systolic function. Her last ejection fraction was 60-65%.  She still has dyspnea on exertion which is probably related to deconditioning and her weight. He is only with climbing steps. She denies any chest pain.  She has hypertension and hyperlipidemia but is on a good medical program for primary prevention of vascular events.  Medications reviewed and she is compliant.  She denies any palpitations, presyncope or syncope, orthopnea, or edema.  Past Medical History  Diagnosis Date  . CARDIOMYOPATHY, SECONDARY 03/08/2010  . CHEST PAIN UNSPECIFIED 03/08/2010  . DYSPNEA ON EXERTION 02/10/2009  . GERD 08/11/2008  . HYPERLIPIDEMIA 08/11/2008  . HYPERTENSION 08/11/2008  . LIPOMA 02/10/2009  . OSTEOPENIA 07/2007  . PREDIABETES 06/15/2009  . 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 Breast cancer   . Carpal tunnel syndrome of left wrist   . Neuropathy   . Craniopharyngioma 2000  . Melanoma 2006    neck  . Interstitial cystitis   . Brain tumor   . DEPRESSION 08/11/2008  . Neuropathy   . Breast ca   . IC (interstitial cystitis)   . Melanoma 2008    DR. Yetta Barre  . Hx breast cancer, IDC, Right, 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/2009    BRCA 2 NEGATIVE, CHEMO AND RADIATION X 1 YR    Current Outpatient Prescriptions  Medication Sig Dispense Refill  . acetaminophen (TYLENOL ARTHRITIS PAIN) 650 MG CR tablet Take 1,300 mg by mouth every 8 (eight) hours as needed. Pain        . aspirin 81 MG tablet Take 81 mg by mouth daily.        Marland Kitchen b complex vitamins tablet Take 1 tablet by mouth daily.        . Calcium Carbonate-Vitamin D (CALCIUM-VITAMIN D) 600-200 MG-UNIT CAPS Take 1 tablet by mouth 2 (two) times daily.        . carvedilol (COREG) 6.25 MG tablet Take 1 tablet (6.25 mg total) by mouth 2 (two) times daily with a meal.  60 tablet  6  .  esomeprazole (NEXIUM) 40 MG capsule Take 1 capsule (40 mg total) by mouth daily before breakfast.  30 capsule  5  . hydrOXYzine (ATARAX) 25 MG tablet Take 25 mg by mouth daily.        Marland Kitchen LORazepam (ATIVAN) 0.5 MG tablet Take 0.5 mg by mouth at bedtime.        . Misc Natural Products (COSAMIN ASU ADVANCED FORMULA PO) Take 2 tablets by mouth daily.        . Multiple Vitamins-Minerals (CENTRUM SILVER PO) Take 1 tablet by mouth daily.       . pentosan polysulfate (ELMIRON) 100 MG capsule Take 200 mg by mouth 2 (two) times daily.       . rosuvastatin (CRESTOR) 20 MG tablet Take 20 mg by mouth daily.        . sucralfate (CARAFATE) 1 G tablet Take 1 g by mouth as needed. indigestion      . telmisartan (MICARDIS) 40 MG tablet Take 40 mg by mouth daily.        Marland Kitchen venlafaxine (EFFEXOR-XR) 75 MG 24 hr capsule Take 1 capsule (75 mg total) by mouth daily.  30 capsule  6  . vitamin E 400 UNIT capsule Take 400 Units by mouth daily.  Current Facility-Administered Medications  Medication Dose Route Frequency Provider Last Rate Last Dose  . 0.9 %  sodium chloride infusion  500 mL Intravenous Continuous Louis Meckel, MD        No Known Allergies  Family History  Problem Relation Age of Onset  . Breast cancer Mother   . Lung cancer Mother   . Cancer Mother     sarcoma  . Hypertension Mother   . Colon cancer Neg Hx   . Prostate cancer Father   . Heart disease Father   . Cancer Father   . Prostate cancer Brother   . Cancer Brother     PROSTATE  . Stomach cancer Maternal Aunt   . Uterine cancer Maternal Aunt   . Down syndrome Son     History   Social History  . Marital Status: Married    Spouse Name: N/A    Number of Children: 3  . Years of Education: N/A   Occupational History  . boutique owner    Social History Main Topics  . Smoking status: Never Smoker   . Smokeless tobacco: Never Used  . Alcohol Use: Yes     rarely  . Drug Use: No  . Sexually Active: Not on file   Other  Topics Concern  . Not on file   Social History Narrative  . No narrative on file    ROS ALL NEGATIVE EXCEPT THOSE NOTED IN HPI  PE  General Appearance: well developed, well nourished in no acute distress, obese HEENT: symmetrical face, PERRLA, good dentition  Neck: no JVD, thyromegaly, or adenopathy, trachea midline Chest: symmetric without deformity Cardiac: PMI non-displaced, RRR, normal S1, S2, no gallop or murmur Lung: clear to ausculation and percussion Vascular: all pulses full without bruits  Abdominal: nondistended, nontender, good bowel sounds, no HSM, no bruits Extremities: no cyanosis, clubbing or edema, no sign of DVT, no varicosities  Skin: normal color, no rashes Neuro: alert and oriented x 3, non-focal Pysch: normal affect  EKG  BMET    Component Value Date/Time   NA 144 10/20/2010 0854   NA 137 09/07/2010 2110   K 4.2 10/20/2010 0854   K 3.8 09/07/2010 2110   CL 108 10/20/2010 0854   CL 98 09/07/2010 2110   CO2 26 10/20/2010 0854   CO2 27 09/07/2010 2110   GLUCOSE 126* 10/20/2010 0854   GLUCOSE 98 09/07/2010 2110   BUN 13 10/20/2010 0854   BUN 12 09/07/2010 2110   CREATININE 0.9 10/20/2010 0854   CREATININE 0.80 09/07/2010 2110   CALCIUM 8.8 10/20/2010 0854   CALCIUM 9.5 09/07/2010 2110   GFRNONAA >60 09/07/2010 2110   GFRAA >60 09/07/2010 2110    Lipid Panel     Component Value Date/Time   CHOL 133 06/09/2009 0908   TRIG 164.0* 06/09/2009 0908   HDL 38.60* 06/09/2009 0908   CHOLHDL 3 06/09/2009 0908   VLDL 32.8 06/09/2009 0908   LDLCALC 62 06/09/2009 0908    CBC    Component Value Date/Time   WBC 4.7 10/20/2010 0854   WBC 6.1 09/07/2010 2110   RBC 3.65* 10/20/2010 0854   RBC 3.78* 09/07/2010 2110   HGB 10.9* 10/20/2010 0854   HGB 11.1* 09/07/2010 2110   HCT 32.4* 10/20/2010 0854   HCT 33.6* 09/07/2010 2110   PLT 203 10/20/2010 0854   PLT 203 09/07/2010 2110   MCV 88.8 10/20/2010 0854   MCV 88.9 09/07/2010 2110   MCH 29.9 10/20/2010 0854  MCH 29.4 09/07/2010 2110   MCHC 33.7  10/20/2010 0854   MCHC 33.0 09/07/2010 2110   RDW 13.8 10/20/2010 0854   RDW 13.8 09/07/2010 2110   LYMPHSABS 1.1 10/20/2010 0854   LYMPHSABS 1.3 10/05/2009 0913   MONOABS 0.4 10/20/2010 0854   MONOABS 1.0 10/05/2009 0913   EOSABS 0.3 10/20/2010 0854   EOSABS 1.6* 10/05/2009 0913   BASOSABS 0.0 10/20/2010 0854   BASOSABS 0.2* 10/05/2009 0913

## 2011-01-03 NOTE — Assessment & Plan Note (Signed)
Multifactorial but mostly from obesity and deconditioning. Encouraged her to lose weight.

## 2011-01-03 NOTE — Assessment & Plan Note (Signed)
This has resolved with discontinuation of the chemotherapy. Echocardiogram in July showed ejection fraction 60-65%. We will continue carvedilol as well as her ARB to decrease the chance of any relapse. This will also prevent hypertension, reduce her risk of stroke and myocardial infarction, and reduce her risk of developing any renal disease. I will see her back in one year unless problems arise.

## 2011-01-03 NOTE — Patient Instructions (Signed)
Your physician recommends that you continue on your current medications as directed. Please refer to the Current Medication list given to you today.  Your physician wants you to follow-up in: 1 year with Dr. Daleen Squibb. You will receive a reminder letter in the mail two months in advance. If you don't receive a letter, please call our office to schedule the follow-up appointment.  Your physician encouraged you to lose weight for better health.

## 2011-01-06 ENCOUNTER — Other Ambulatory Visit: Payer: Self-pay | Admitting: Family Medicine

## 2011-01-09 ENCOUNTER — Ambulatory Visit (INDEPENDENT_AMBULATORY_CARE_PROVIDER_SITE_OTHER): Payer: Medicare Other | Admitting: Women's Health

## 2011-01-09 ENCOUNTER — Encounter: Payer: Self-pay | Admitting: Women's Health

## 2011-01-09 VITALS — BP 120/84 | Ht 63.0 in | Wt 208.0 lb

## 2011-01-09 DIAGNOSIS — Z124 Encounter for screening for malignant neoplasm of cervix: Secondary | ICD-10-CM

## 2011-01-09 DIAGNOSIS — L293 Anogenital pruritus, unspecified: Secondary | ICD-10-CM

## 2011-01-09 DIAGNOSIS — N898 Other specified noninflammatory disorders of vagina: Secondary | ICD-10-CM

## 2011-01-09 MED ORDER — NYSTATIN 100000 UNIT/GM EX CREA: TOPICAL_CREAM | Freq: Two times a day (BID) | CUTANEOUS | Status: DC

## 2011-01-09 NOTE — Progress Notes (Signed)
Shannon [name] February 24, 1945 [id number]    History:    The patient presents for external vaginal itching.  Past medical history, past surgical history, family history and social history were all reviewed and documented in the EPIC chart.   ROS:  A  ROS was performed and pertinent positives and negatives are included in the history.  Exam:  There were no vitals filed for this visit.  General appearance:  Normal Head/Neck:  Normal, without cervical or supraclavicular adenopathy. Thyroid:  Symmetrical, normal in size, without palpable masses or nodularity. Respiratory  Effort:  Normal  Auscultation:  Clear without wheezing or rhonchi Cardiovascular  Auscultation:  Regular rate, without rubs, murmurs or gallops  Edema/varicosities:  Not grossly evident Abdominal  Soft,nontender, without masses, guarding or rebound.  Liver/spleen:  No organomegaly noted  Hernia:  None appreciated  Skin  Inspection:  Grossly normal  Palpation:  Grossly normal Neurologic/psychiatric  Orientation:  Normal with appropriate conversation.  Mood/affect:  Normal  Genitourinary    Breasts: Examined lying and sitting.     Right: Without masses, retractions, discharge or axillary adenopathy.     Left: Without masses, retractions, discharge or axillary adenopathy.   Inguinal/mons:  Normal without inguinal adenopathy  External genitalia:  Normal  BUS/Urethra/Skene's glands:  Normal  Bladder:  Normal  Vagina:  Normal  Cervix:  Normal  Uterus:   normal in size, shape and contour.  Midline and mobile  Adnexa/parametria:     Rt: Without masses or tenderness.   Lt: Without masses or tenderness.  Anus and perineum: Normal  Digital rectal exam: Normal sphincter tone without palpated masses or tenderness  Assessment/Plan:  65 y.o. MWF G3 P3 for complaint of vaginal itching. Postmenopausal with no bleeding and no HRT. History of melanoma in 08 with normal skin checks after. Right breast cancer 2011 with  lumpectomy, chemotherapy and radiation. Stopped chemotherapy January 2012 due to side effects. Recently had a negative MRI.  Negative BRCA testing. Mother also history of breast cancer. Negative colonoscopy in 2010. History of normal Paps.  Yeast Hypertension/depression/hypercholesteremia-primary care-Dr. Sander Radon- labs and meds Right Breast cancer-2011-Dr Magrinat Osteopenia/T score negative 2/no medications Anxiety/depression/Dr. Noe Gens  Plan: Nystatin cream externally for vaginal itching. Call if no relief, states has used in the past with good relief. Instructed to schedule DEXA, fall prevention and home safety discussed. Encourage vitamin D 2000 daily, calcium rich diet. SBEs, continual annual mammogram, increase exercise as able.   [name] WHNP, 1:24 PM 01/09/2011

## 2011-01-11 ENCOUNTER — Telehealth: Payer: Self-pay | Admitting: *Deleted

## 2011-01-11 NOTE — Telephone Encounter (Signed)
Pt called stating pharmacy never got rx from last office visit. rx for nystatin cream called in to pharmacy.

## 2011-02-07 ENCOUNTER — Other Ambulatory Visit: Payer: Self-pay | Admitting: *Deleted

## 2011-02-07 DIAGNOSIS — Z780 Asymptomatic menopausal state: Secondary | ICD-10-CM

## 2011-02-23 ENCOUNTER — Telehealth: Payer: Self-pay | Admitting: Oncology

## 2011-02-23 NOTE — Telephone Encounter (Signed)
lmonvm adviisng the pt of her  March 2013 appts.

## 2011-02-27 DIAGNOSIS — H2510 Age-related nuclear cataract, unspecified eye: Secondary | ICD-10-CM | POA: Diagnosis not present

## 2011-02-28 DIAGNOSIS — Z85820 Personal history of malignant melanoma of skin: Secondary | ICD-10-CM | POA: Diagnosis not present

## 2011-02-28 DIAGNOSIS — L821 Other seborrheic keratosis: Secondary | ICD-10-CM | POA: Diagnosis not present

## 2011-02-28 DIAGNOSIS — L570 Actinic keratosis: Secondary | ICD-10-CM | POA: Diagnosis not present

## 2011-03-07 ENCOUNTER — Ambulatory Visit
Admission: RE | Admit: 2011-03-07 | Discharge: 2011-03-07 | Disposition: A | Discharge status: Home / Self Care | Payer: Medicare Other | Source: Ambulatory Visit | Attending: Gynecology | Admitting: Gynecology

## 2011-03-07 DIAGNOSIS — M810 Age-related osteoporosis without current pathological fracture: Secondary | ICD-10-CM | POA: Insufficient documentation

## 2011-03-07 DIAGNOSIS — M899 Disorder of bone, unspecified: Secondary | ICD-10-CM | POA: Diagnosis not present

## 2011-03-07 DIAGNOSIS — Z780 Asymptomatic menopausal state: Secondary | ICD-10-CM

## 2011-03-07 HISTORY — DX: Age-related osteoporosis without current pathological fracture: M81.0

## 2011-03-08 ENCOUNTER — Encounter: Payer: Self-pay | Admitting: Women's Health

## 2011-03-08 ENCOUNTER — Ambulatory Visit (INDEPENDENT_AMBULATORY_CARE_PROVIDER_SITE_OTHER): Payer: Medicare Other | Admitting: Women's Health

## 2011-03-08 DIAGNOSIS — M810 Age-related osteoporosis without current pathological fracture: Secondary | ICD-10-CM | POA: Diagnosis not present

## 2011-03-08 DIAGNOSIS — M948X9 Other specified disorders of cartilage, unspecified sites: Secondary | ICD-10-CM | POA: Diagnosis not present

## 2011-03-08 DIAGNOSIS — M898X9 Other specified disorders of bone, unspecified site: Secondary | ICD-10-CM

## 2011-03-08 DIAGNOSIS — E079 Disorder of thyroid, unspecified: Secondary | ICD-10-CM | POA: Diagnosis not present

## 2011-03-08 DIAGNOSIS — R3915 Urgency of urination: Secondary | ICD-10-CM | POA: Diagnosis not present

## 2011-03-08 DIAGNOSIS — R109 Unspecified abdominal pain: Secondary | ICD-10-CM | POA: Diagnosis not present

## 2011-03-08 DIAGNOSIS — N3010 Interstitial cystitis (chronic) without hematuria: Secondary | ICD-10-CM | POA: Diagnosis not present

## 2011-03-08 DIAGNOSIS — M8580 Other specified disorders of bone density and structure, unspecified site: Secondary | ICD-10-CM

## 2011-03-08 DIAGNOSIS — R350 Frequency of micturition: Secondary | ICD-10-CM | POA: Diagnosis not present

## 2011-03-08 LAB — TSH: TSH: 1.603 u[IU]/mL (ref 0.350–4.500)

## 2011-03-08 MED ORDER — RISEDRONATE SODIUM 35 MG PO TBEC: 35.0000 mg | DELAYED_RELEASE_TABLET | ORAL | Status: DC

## 2011-03-08 NOTE — Progress Notes (Signed)
Patient ID: Shannon [name], female   DOB: 1945-02-17, 66 y.o.   MRN: [id number] Presents to discuss bone density. Dexa July  2009 showed osteopenia T score -2 at  AP spine and  -1.8 at the left femur. Bone density from February 2013 shows a significant increase in bone loss and osteoporosis. Significant history breast cancer diagnosed in 2011 with both chemotherapy radiation and surgery. Also does have a history of a benign brain tumor in 2000 and melanoma in 08. States both mother and sister have osteoporosis, neither have had a fracture.  DEXA performed at the breast center on 03/07/11 showed a T score of -2.6 of the spine, -2.0 of the left femur. 7% decrease in BMD.  Osteoporosis History of right breast cancer with lumpectomy/chemotherapy/radiation 2011 Hypertension/GERD /IC- primary care labs and meds  Plan: Options reviewed to watch, biphosphonates, injectables-reclast, prolia. Patient states would like treatment, has a son with Down's and needs to avoid fractures. Discussion of home safety and fall prevention reviewed. Will try Atelvia 35 mg weekly. Sample, proper administration, discontinuing prior to dental implants/work, instructed to call if problems with indigestion or reflux. Reviewed importance of exercise and weight bearing exercises for bone health. Calcium rich diet, vitamin D 2000 daily encouraged. Will check a TSH, vitamin D, PTH,

## 2011-03-09 LAB — VITAMIN D 25 HYDROXY (VIT D DEFICIENCY, FRACTURES): Vit D, 25-Hydroxy: 40 ng/mL (ref 30–89)

## 2011-03-09 LAB — PTH, INTACT AND CALCIUM: PTH: 73.9 pg/mL — ABNORMAL HIGH (ref 14.0–72.0)

## 2011-03-23 ENCOUNTER — Encounter: Payer: Self-pay | Admitting: Gynecology

## 2011-04-03 ENCOUNTER — Ambulatory Visit (INDEPENDENT_AMBULATORY_CARE_PROVIDER_SITE_OTHER): Payer: Medicare Other | Admitting: Surgery

## 2011-04-03 ENCOUNTER — Encounter (INDEPENDENT_AMBULATORY_CARE_PROVIDER_SITE_OTHER): Payer: Self-pay | Admitting: Surgery

## 2011-04-03 VITALS — BP 148/98 | HR 80 | Temp 97.4°F | Resp 24 | Ht 63.0 in | Wt 206.0 lb

## 2011-04-03 DIAGNOSIS — Z853 Personal history of malignant neoplasm of breast: Secondary | ICD-10-CM

## 2011-04-03 NOTE — Patient Instructions (Signed)
Have your next mammogram this summer and come back to be seen here in one year

## 2011-04-03 NOTE — Progress Notes (Signed)
NAME: Shannon [name]       DOB: 03-14-1945           DATE: 04/03/2011       MRN: [id number]   Shannon [name] is a 66 y.o.Marland Kitchenfemale who presents for routine followup of her Right breast cancer, lower inner quadrant, Her 2+ diagnosed in 2011 and treated with neoadjuvant chemo, lumpectomy and radiation. She has no problems or concerns on either side.  PFSH: She has had no significant changes since the last visit here.  ROS: There have been no significant changes since the last visit here  EXAM: General: The patient is alert, oriented, generally healty appearing, NAD. Mood and affect are normal.  Breasts:  Right breast is normal although there are some post surgical changes and slight thickening at the lumpectomy site. There is some pallor to the breast. No Mass and not tender. Left breast normal  Lymphatics: She has no axillary or supraclavicular adenopathy on either side.  Extremities: Full ROM of the surgical side with no lymphedema noted.  Data Reviewed: Last mammogram this past summer is negative  Impression: Doing well, with no evidence of recurrent cancer or new cancer  Plan: Will continue to follow up on an annual basis here.She will have a mammogram this summer

## 2011-04-14 DIAGNOSIS — IMO0002 Reserved for concepts with insufficient information to code with codable children: Secondary | ICD-10-CM | POA: Diagnosis not present

## 2011-04-14 DIAGNOSIS — M1710 Unilateral primary osteoarthritis, unspecified knee: Secondary | ICD-10-CM | POA: Diagnosis not present

## 2011-04-17 DIAGNOSIS — M1710 Unilateral primary osteoarthritis, unspecified knee: Secondary | ICD-10-CM | POA: Diagnosis not present

## 2011-04-17 DIAGNOSIS — IMO0002 Reserved for concepts with insufficient information to code with codable children: Secondary | ICD-10-CM | POA: Diagnosis not present

## 2011-04-19 ENCOUNTER — Other Ambulatory Visit (HOSPITAL_BASED_OUTPATIENT_CLINIC_OR_DEPARTMENT_OTHER): Payer: Medicare Other | Admitting: Lab

## 2011-04-19 DIAGNOSIS — C50319 Malignant neoplasm of lower-inner quadrant of unspecified female breast: Secondary | ICD-10-CM

## 2011-04-19 DIAGNOSIS — Z452 Encounter for adjustment and management of vascular access device: Secondary | ICD-10-CM | POA: Diagnosis not present

## 2011-04-19 DIAGNOSIS — Z171 Estrogen receptor negative status [ER-]: Secondary | ICD-10-CM

## 2011-04-19 LAB — COMPREHENSIVE METABOLIC PANEL
AST: 20 U/L (ref 0–37)
Albumin: 4.2 g/dL (ref 3.5–5.2)
Alkaline Phosphatase: 115 U/L (ref 39–117)
BUN: 17 mg/dL (ref 6–23)
Potassium: 4 mEq/L (ref 3.5–5.3)
Sodium: 142 mEq/L (ref 135–145)
Total Bilirubin: 0.4 mg/dL (ref 0.3–1.2)

## 2011-04-19 LAB — CBC WITH DIFFERENTIAL/PLATELET
Basophils Absolute: 0 10*3/uL (ref 0.0–0.1)
EOS%: 5.1 % (ref 0.0–7.0)
LYMPH%: 21.7 % (ref 14.0–49.7)
MCH: 28.7 pg (ref 25.1–34.0)
MCV: 87 fL (ref 79.5–101.0)
MONO%: 6.8 % (ref 0.0–14.0)
RBC: 4.28 10*6/uL (ref 3.70–5.45)
RDW: 14.8 % — ABNORMAL HIGH (ref 11.2–14.5)

## 2011-04-26 ENCOUNTER — Telehealth: Payer: Self-pay | Admitting: *Deleted

## 2011-04-26 ENCOUNTER — Encounter: Payer: Self-pay | Admitting: Physician Assistant

## 2011-04-26 ENCOUNTER — Ambulatory Visit (HOSPITAL_BASED_OUTPATIENT_CLINIC_OR_DEPARTMENT_OTHER): Payer: Medicare Other | Admitting: Physician Assistant

## 2011-04-26 VITALS — BP 141/83 | HR 81 | Temp 98.6°F | Ht 63.0 in | Wt 208.7 lb

## 2011-04-26 DIAGNOSIS — Z853 Personal history of malignant neoplasm of breast: Secondary | ICD-10-CM | POA: Diagnosis not present

## 2011-04-26 NOTE — Progress Notes (Signed)
ID: [name]   DOB: September 25, 1945  MR#: [id number]  WUJ#:[id number]  HISTORY OF PRESENT ILLNESS: Shannon [name] had a negative screening mammogram in July of 2010.  In April 2011, she felt a change in the lower aspect of the right breast and brought this to Dr. Tenna [name] attention.  He set her up for a diagnostic mammography on April 7 and Dr. Renato [name] found an interval mass with ill defined margins, deep in the lower, inner right breast.  This was palpable at the 3:30 position, approximately 9 cm from the nipple.  There were no palpable right axillary lymph nodes and by ultrasound there were no abnormal lymph nodes in the right axilla.  The ultrasound did show a 1.7 cm, irregular hypoechoic mass, just beneath the skin.  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  The tumor cells were estrogen receptor and progesterone receptor negative, both at 0%.  The MIB-1 was elevated at 57% and the HER2/neu ratio by CISH was positive at 2.74.  Patient was treated in the neoadjuvant setting with multiple agents due to tolerance issues.  (1) 2 cycles of standard dose of paclitaxel/carboplatin/trastuzumab  (2) 3 cycles of paclitaxel/trastuzumab given weekly, the last of which was given with weekly carboplatin  (3) 2 cycles of dose dense doxorubicin/cyclophosphamide, with doxorubicin given by continuous infusion.  Chemotherapy was completed in August of 2011, after which she underwent definitive right lumpectomy and sentinel lymph node sampling in September 2011. This showed a complete pathologic response both in the breast and the lymph nodes.  She completed radiation therapy in November 2011. She continued to receive trastuzumab until January 2012, at which time it was discontinued secondary to a drop in her ejection fraction which later completely resolved. Continues now on observation  alone.  INTERVAL HISTORY:  Shannon [name] returns today for routine followup of her right breast carcinoma. Interval history is unremarkable, and Shannon [name] is "feeling great". She is a little worried about her youngest son Shannon [name] who is now 46 years old. 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 A positive note, Shannon [name] and her husband are in the process of purchasing a Mountain home in  [place], [place].  REVIEW OF SYSTEMS: Physically, Shannon [name] is feeling well. She has some osteoarthritis with causing a problem with her right knee. She's been getting some injections which is slightly helpful, but this is limiting her ability to exercise. She's had no recent illnesses and denies fevers, chills, or night sweats. No nausea or change in bowel habits. No chest pain shortness of breath or peripheral swelling. No abnormal headaches or dizziness.  A detailed review of systems is otherwise noncontributory.   PAST MEDICAL HISTORY: Past Medical History  Diagnosis Date  . CARDIOMYOPATHY, SECONDARY 03/08/2010  . CHEST PAIN UNSPECIFIED 03/08/2010  . DYSPNEA ON EXERTION 02/10/2009  . GERD 08/11/2008  . HYPERLIPIDEMIA 08/11/2008  . HYPERTENSION 08/11/2008  . LIPOMA 02/10/2009  . OSTEOPENIA 07/2007  . PREDIABETES 06/15/2009  . 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 Carpal tunnel syndrome of left wrist   . Neuropathy   . Craniopharyngioma 2000  . Interstitial cystitis   . Brain tumor   . DEPRESSION 08/11/2008  . Neuropathy   . IC (interstitial cystitis)   . Hx breast cancer, IDC, Right, 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/2009    BRCA 2 NEGATIVE, CHEMO AND RADIATION X  1 YR  . Arthritis   . Asthma   . Breast cancer     right  . Melanoma 2006    neck  . Breast CA   . Melanoma 2008    DR. Yetta Barre  . Osteoporosis     PAST SURGICAL HISTORY: Past Surgical History  Procedure Date  . Cholecystectomy   . Carpal tunnel release     L wrist  . Elbow surgery     left  . Tubal ligation 1997  . Lipoma  excision 03/28/2009    right leg  . Portacath placement may 2011  . Craniotomy for tumor 2000  . Tonsillectomy 1958  . Melanoma excision   . Porta cath   . Port-a-cath removal 11/30/2010    Streck  . Breast lumpectomy 04/2009    RIGHT FOR BREAST CANCER-CHEMO/RADIATION X 1 YEAR    FAMILY HISTORY Family History  Problem Relation Age of Onset  . Breast cancer Mother   . Lung cancer Mother   . Cancer Mother     sarcoma  . Hypertension Mother   . Colon cancer Neg Hx   . Prostate cancer Father   . Heart disease Father   . Cancer Father   . Prostate cancer Brother   . Cancer Brother     PROSTATE  . Stomach cancer Maternal Aunt   . Uterine cancer Maternal Aunt   . Down syndrome Son    GYNECOLOGIC HISTORY:  She is P3, first pregnancy to term age 61.  Change of life around age 5.  She took hormone replacement for several years including some Premarin for about 8 years.  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  Her husband Shannon [name]  is an English as a second language teacher.  Daughter Shannon [name] is a Engineer, civil (consulting) at [employer] in [place].  Son Shannon [name] works as an Radio broadcast assistant, actually not with his dad.  The third [name] is Shannon [name] who lives at home, has Down's syndrome.  The patient attends [employer] ([place]   ADVANCED DIRECTIVES:  HEALTH MAINTENANCE: History  Substance Use Topics  . Smoking status: Never Smoker   . Smokeless tobacco: Never Used  . Alcohol Use: No     Colonoscopy:  PAP:  Bone density:  Lipid panel:  No Known Allergies  Current Outpatient Prescriptions  Medication Sig Dispense Refill  . aspirin 81 MG tablet Take 81 mg by mouth daily.        Marland Kitchen b complex vitamins tablet Take 1 tablet by mouth daily.        . carvedilol (COREG) 6.25 MG tablet Take 1 tablet (6.25 mg total) by mouth 2 (two) times daily with a meal.  60 tablet  6  . cholecalciferol (VITAMIN D) 400 UNITS TABS Take 2,000 Units  by mouth daily.      Marland Kitchen esomeprazole (NEXIUM) 40 MG capsule Take 1 capsule (40 mg total) by mouth daily before breakfast.  30 capsule  5  . hydrOXYzine (ATARAX) 25 MG tablet Take 25 mg by mouth daily.        Marland Kitchen LORazepam (ATIVAN) 0.5 MG tablet Take 0.5 mg by mouth at bedtime.        . Misc Natural Products (COSAMIN ASU ADVANCED FORMULA PO) Take 2 tablets by mouth daily.        . Multiple Vitamins-Minerals (CENTRUM SILVER PO) Take 1 tablet by mouth daily.       . pentosan  polysulfate (ELMIRON) 100 MG capsule Take 200 mg by mouth 2 (two) times daily.       . Risedronate Sodium (ATELVIA) 35 MG TBEC Take 1 tablet (35 mg total) by mouth once a week.  4 tablet  12  . rosuvastatin (CRESTOR) 20 MG tablet Take 20 mg by mouth daily.        Marland Kitchen telmisartan (MICARDIS) 40 MG tablet Take 40 mg by mouth daily.        Marland Kitchen venlafaxine (EFFEXOR-XR) 75 MG 24 hr capsule TAKE 1 CAPSULE (75 MG TOTAL) BY MOUTH DAILY.  30 capsule  3  . Calcium Carbonate-Vitamin D (CALCIUM-VITAMIN D) 600-200 MG-UNIT CAPS Take 1 tablet by mouth 2 (two) times daily.        . sucralfate (CARAFATE) 1 G tablet Take 1 g by mouth as needed. indigestion       Current Facility-Administered Medications  Medication Dose Route Frequency Provider Last Rate Last Dose  . nystatin cream (MYCOSTATIN)   Topical BID [name], NP        OBJECTIVE: Filed Vitals:   04/26/11 1512  BP: 141/83  Pulse: 81  Temp: 98.6 F (37 C)     Body mass index is 36.97 kg/(m^2).    ECOG FS: 0  Sclerae unicteric Oropharynx clear No peripheral adenopathy Lungs no rales or rhonchi, no wheezes Heart regular rate and rhythm Abd benign, soft, nontender, positive bowel sounds. No organomegaly palpated  MSK no focal spinal tenderness, no peripheral edema Skin is benign  Neuro: nonfocal, alert and oriented x3  Breasts: Right breast is status post lumpectomy with well-healed incision. No stitches nodularity, skin changes, or evidence of local recurrence. Left breast is  benign, with no masses, skin changes, or nipple inversion.  LAB RESULTS: Lab Results  Component Value Date   WBC 7.4 04/19/2011   NEUTROABS 4.9 04/19/2011   HGB 12.3 04/19/2011   HCT 37.2 04/19/2011   MCV 87.0 04/19/2011   PLT 287 04/19/2011      Chemistry      Component Value Date/Time   NA 142 04/19/2011 1220   NA 144 10/20/2010 0854   K 4.0 04/19/2011 1220   K 4.2 10/20/2010 0854   CL 108 04/19/2011 1220   CL 108 10/20/2010 0854   CO2 24 04/19/2011 1220   CO2 26 10/20/2010 0854   BUN 17 04/19/2011 1220   BUN 13 10/20/2010 0854   CREATININE 0.94 04/19/2011 1220   CREATININE 0.9 10/20/2010 0854      Component Value Date/Time   CALCIUM 9.1 04/19/2011 1220   CALCIUM 9.3 03/08/2011 1447   CALCIUM 8.8 10/20/2010 0854   ALKPHOS 115 04/19/2011 1220   ALKPHOS 106* 10/20/2010 0854   AST 20 04/19/2011 1220   AST 32 10/20/2010 0854   ALT 23 04/19/2011 1220   BILITOT 0.4 04/19/2011 1220   BILITOT 0.50 10/20/2010 0854       Lab Results  Component Value Date   LABCA2 15 04/19/2011     STUDIES: Most recent bone density 03/07/2011 showed osteoporosis with statistically significant decrease in the bone mineral density in both the spine and left hip as compared to April of 2002.   Most recent mammogram was in July 2012 and was unremarkable.   ASSESSMENT: A 66 year old [place] woman   (1)  status post right breast biopsy April 2011 for a clinical T2 N0 grade 3 invasive ductal carcinoma which was estrogen and progesterone receptor negative, HER-2/neu positive with an MIB-1 of 58%   (  2)  treated neoadjuvantly with multiple agents (due to tolerance issues):  Namely 2 cycles of standard dose Taxol/carboplatin/trastuzumab followed by 3 cycles of paclitaxel/trastuzumab given weekly, the last of this given with weekly carboplatin, followed by 2 cycles of dose dense doxorubicin and cyclophosphamide with the doxorubicin given by continuous infusion all of this completed in August 2011   (3)  she had her  definitive right lumpectomy and sentinel lymph node sampling September 2011 showing a complete pathologic response both in the breast and sampled lymph nodes.    (4)  She completed radiation therapy November 2011.    (5)  She continued to receive trastuzumab until January 2012 at which time it was discontinued secondary to a drop in her ejection fraction which has now completely resolved.   PLAN: With regards to her breast cancer, Naamah is doing well, and there is no clinical evidence of disease recurrence at this time. She is being scheduled for her bilateral mammogram in July. She will return to see Dr. Darnelle Catalan in 6 months, September of 2013, which will be her 2 year anniversary from surgery. Prior to that visit we will obtain a chest CT for restaging.  Patient voices understanding and agreement with this plan, and will call with any changes or problems.   Shannon [name]    04/26/2011

## 2011-04-26 NOTE — Telephone Encounter (Signed)
made patient appointment for mammogram 07-2011 ct chest with contrast 10-03-2011 at 10:00am labs at 9:45am

## 2011-05-12 DIAGNOSIS — M25819 Other specified joint disorders, unspecified shoulder: Secondary | ICD-10-CM | POA: Diagnosis not present

## 2011-05-12 DIAGNOSIS — S43499A Other sprain of unspecified shoulder joint, initial encounter: Secondary | ICD-10-CM | POA: Diagnosis not present

## 2011-05-14 ENCOUNTER — Other Ambulatory Visit: Payer: Self-pay | Admitting: Cardiology

## 2011-05-21 ENCOUNTER — Other Ambulatory Visit: Payer: Self-pay | Admitting: Family Medicine

## 2011-05-23 DIAGNOSIS — M1710 Unilateral primary osteoarthritis, unspecified knee: Secondary | ICD-10-CM | POA: Diagnosis not present

## 2011-05-23 DIAGNOSIS — IMO0002 Reserved for concepts with insufficient information to code with codable children: Secondary | ICD-10-CM | POA: Diagnosis not present

## 2011-05-26 DIAGNOSIS — S46819A Strain of other muscles, fascia and tendons at shoulder and upper arm level, unspecified arm, initial encounter: Secondary | ICD-10-CM | POA: Diagnosis not present

## 2011-05-26 DIAGNOSIS — S43499A Other sprain of unspecified shoulder joint, initial encounter: Secondary | ICD-10-CM | POA: Diagnosis not present

## 2011-05-29 DIAGNOSIS — M1710 Unilateral primary osteoarthritis, unspecified knee: Secondary | ICD-10-CM | POA: Diagnosis not present

## 2011-05-29 DIAGNOSIS — IMO0002 Reserved for concepts with insufficient information to code with codable children: Secondary | ICD-10-CM | POA: Diagnosis not present

## 2011-05-30 DIAGNOSIS — M25819 Other specified joint disorders, unspecified shoulder: Secondary | ICD-10-CM | POA: Diagnosis not present

## 2011-05-31 DIAGNOSIS — S46819A Strain of other muscles, fascia and tendons at shoulder and upper arm level, unspecified arm, initial encounter: Secondary | ICD-10-CM | POA: Diagnosis not present

## 2011-05-31 DIAGNOSIS — S43499A Other sprain of unspecified shoulder joint, initial encounter: Secondary | ICD-10-CM | POA: Diagnosis not present

## 2011-06-02 DIAGNOSIS — S46819A Strain of other muscles, fascia and tendons at shoulder and upper arm level, unspecified arm, initial encounter: Secondary | ICD-10-CM | POA: Diagnosis not present

## 2011-06-02 DIAGNOSIS — S43499A Other sprain of unspecified shoulder joint, initial encounter: Secondary | ICD-10-CM | POA: Diagnosis not present

## 2011-06-07 DIAGNOSIS — S43499A Other sprain of unspecified shoulder joint, initial encounter: Secondary | ICD-10-CM | POA: Diagnosis not present

## 2011-06-12 DIAGNOSIS — S43499A Other sprain of unspecified shoulder joint, initial encounter: Secondary | ICD-10-CM | POA: Diagnosis not present

## 2011-06-13 DIAGNOSIS — M2240 Chondromalacia patellae, unspecified knee: Secondary | ICD-10-CM | POA: Diagnosis not present

## 2011-06-13 DIAGNOSIS — M2390 Unspecified internal derangement of unspecified knee: Secondary | ICD-10-CM | POA: Diagnosis not present

## 2011-06-14 DIAGNOSIS — S43499A Other sprain of unspecified shoulder joint, initial encounter: Secondary | ICD-10-CM | POA: Diagnosis not present

## 2011-06-14 DIAGNOSIS — S46819A Strain of other muscles, fascia and tendons at shoulder and upper arm level, unspecified arm, initial encounter: Secondary | ICD-10-CM | POA: Diagnosis not present

## 2011-06-16 DIAGNOSIS — S46819A Strain of other muscles, fascia and tendons at shoulder and upper arm level, unspecified arm, initial encounter: Secondary | ICD-10-CM | POA: Diagnosis not present

## 2011-06-16 DIAGNOSIS — S43499A Other sprain of unspecified shoulder joint, initial encounter: Secondary | ICD-10-CM | POA: Diagnosis not present

## 2011-06-19 DIAGNOSIS — S46819A Strain of other muscles, fascia and tendons at shoulder and upper arm level, unspecified arm, initial encounter: Secondary | ICD-10-CM | POA: Diagnosis not present

## 2011-06-19 DIAGNOSIS — S43499A Other sprain of unspecified shoulder joint, initial encounter: Secondary | ICD-10-CM | POA: Diagnosis not present

## 2011-06-21 DIAGNOSIS — S46819A Strain of other muscles, fascia and tendons at shoulder and upper arm level, unspecified arm, initial encounter: Secondary | ICD-10-CM | POA: Diagnosis not present

## 2011-06-21 DIAGNOSIS — S43499A Other sprain of unspecified shoulder joint, initial encounter: Secondary | ICD-10-CM | POA: Diagnosis not present

## 2011-06-23 DIAGNOSIS — S46819A Strain of other muscles, fascia and tendons at shoulder and upper arm level, unspecified arm, initial encounter: Secondary | ICD-10-CM | POA: Diagnosis not present

## 2011-06-23 DIAGNOSIS — S43499A Other sprain of unspecified shoulder joint, initial encounter: Secondary | ICD-10-CM | POA: Diagnosis not present

## 2011-06-23 DIAGNOSIS — M25819 Other specified joint disorders, unspecified shoulder: Secondary | ICD-10-CM | POA: Diagnosis not present

## 2011-06-27 DIAGNOSIS — S43499A Other sprain of unspecified shoulder joint, initial encounter: Secondary | ICD-10-CM | POA: Diagnosis not present

## 2011-06-28 ENCOUNTER — Other Ambulatory Visit: Payer: Self-pay | Admitting: Family Medicine

## 2011-06-29 DIAGNOSIS — S43499A Other sprain of unspecified shoulder joint, initial encounter: Secondary | ICD-10-CM | POA: Diagnosis not present

## 2011-07-04 DIAGNOSIS — S46819A Strain of other muscles, fascia and tendons at shoulder and upper arm level, unspecified arm, initial encounter: Secondary | ICD-10-CM | POA: Diagnosis not present

## 2011-07-04 DIAGNOSIS — S43499A Other sprain of unspecified shoulder joint, initial encounter: Secondary | ICD-10-CM | POA: Diagnosis not present

## 2011-07-05 ENCOUNTER — Telehealth: Payer: Self-pay | Admitting: Oncology

## 2011-07-05 NOTE — Telephone Encounter (Signed)
lmonvm adviisng the pt of her cancelled sept appt to oct due to the md's schedule

## 2011-07-06 DIAGNOSIS — S43499A Other sprain of unspecified shoulder joint, initial encounter: Secondary | ICD-10-CM | POA: Diagnosis not present

## 2011-07-06 DIAGNOSIS — S46819A Strain of other muscles, fascia and tendons at shoulder and upper arm level, unspecified arm, initial encounter: Secondary | ICD-10-CM | POA: Diagnosis not present

## 2011-07-13 DIAGNOSIS — S46819A Strain of other muscles, fascia and tendons at shoulder and upper arm level, unspecified arm, initial encounter: Secondary | ICD-10-CM | POA: Diagnosis not present

## 2011-07-13 DIAGNOSIS — S43499A Other sprain of unspecified shoulder joint, initial encounter: Secondary | ICD-10-CM | POA: Diagnosis not present

## 2011-07-17 DIAGNOSIS — S46819A Strain of other muscles, fascia and tendons at shoulder and upper arm level, unspecified arm, initial encounter: Secondary | ICD-10-CM | POA: Diagnosis not present

## 2011-07-17 DIAGNOSIS — S43499A Other sprain of unspecified shoulder joint, initial encounter: Secondary | ICD-10-CM | POA: Diagnosis not present

## 2011-07-19 DIAGNOSIS — S43499A Other sprain of unspecified shoulder joint, initial encounter: Secondary | ICD-10-CM | POA: Diagnosis not present

## 2011-07-19 DIAGNOSIS — S46819A Strain of other muscles, fascia and tendons at shoulder and upper arm level, unspecified arm, initial encounter: Secondary | ICD-10-CM | POA: Diagnosis not present

## 2011-07-21 DIAGNOSIS — S43499A Other sprain of unspecified shoulder joint, initial encounter: Secondary | ICD-10-CM | POA: Diagnosis not present

## 2011-07-21 DIAGNOSIS — S46819A Strain of other muscles, fascia and tendons at shoulder and upper arm level, unspecified arm, initial encounter: Secondary | ICD-10-CM | POA: Diagnosis not present

## 2011-07-21 DIAGNOSIS — M25519 Pain in unspecified shoulder: Secondary | ICD-10-CM | POA: Diagnosis not present

## 2011-07-24 DIAGNOSIS — S43499A Other sprain of unspecified shoulder joint, initial encounter: Secondary | ICD-10-CM | POA: Diagnosis not present

## 2011-07-24 DIAGNOSIS — S46819A Strain of other muscles, fascia and tendons at shoulder and upper arm level, unspecified arm, initial encounter: Secondary | ICD-10-CM | POA: Diagnosis not present

## 2011-07-25 DIAGNOSIS — M1710 Unilateral primary osteoarthritis, unspecified knee: Secondary | ICD-10-CM | POA: Diagnosis not present

## 2011-07-25 DIAGNOSIS — IMO0002 Reserved for concepts with insufficient information to code with codable children: Secondary | ICD-10-CM | POA: Diagnosis not present

## 2011-07-28 ENCOUNTER — Telehealth: Payer: Self-pay | Admitting: Oncology

## 2011-07-28 NOTE — Telephone Encounter (Signed)
S/w the pt and she is aware of her r/s appt to July due to she is not feeling weell.

## 2011-07-31 ENCOUNTER — Other Ambulatory Visit: Payer: Self-pay | Admitting: Family Medicine

## 2011-08-01 DIAGNOSIS — M1710 Unilateral primary osteoarthritis, unspecified knee: Secondary | ICD-10-CM | POA: Diagnosis not present

## 2011-08-01 DIAGNOSIS — IMO0002 Reserved for concepts with insufficient information to code with codable children: Secondary | ICD-10-CM | POA: Diagnosis not present

## 2011-08-06 ENCOUNTER — Other Ambulatory Visit: Payer: Self-pay | Admitting: Family Medicine

## 2011-08-08 ENCOUNTER — Ambulatory Visit
Admission: RE | Admit: 2011-08-08 | Discharge: 2011-08-08 | Disposition: A | Discharge status: Home / Self Care | Payer: Medicare Other | Source: Ambulatory Visit | Attending: Physician Assistant | Admitting: Physician Assistant

## 2011-08-08 DIAGNOSIS — Z853 Personal history of malignant neoplasm of breast: Secondary | ICD-10-CM | POA: Diagnosis not present

## 2011-08-09 ENCOUNTER — Ambulatory Visit (HOSPITAL_BASED_OUTPATIENT_CLINIC_OR_DEPARTMENT_OTHER): Payer: Medicare Other | Admitting: Lab

## 2011-08-09 ENCOUNTER — Ambulatory Visit (HOSPITAL_COMMUNITY)
Admission: RE | Admit: 2011-08-09 | Discharge: 2011-08-09 | Disposition: A | Discharge status: Home / Self Care | Payer: Medicare Other | Source: Ambulatory Visit | Attending: Oncology | Admitting: Oncology

## 2011-08-09 ENCOUNTER — Telehealth: Payer: Self-pay | Admitting: Oncology

## 2011-08-09 ENCOUNTER — Ambulatory Visit (HOSPITAL_BASED_OUTPATIENT_CLINIC_OR_DEPARTMENT_OTHER): Payer: Medicare Other | Admitting: Oncology

## 2011-08-09 VITALS — BP 128/82 | HR 84 | Temp 98.3°F | Ht 63.0 in | Wt 210.3 lb

## 2011-08-09 DIAGNOSIS — C50319 Malignant neoplasm of lower-inner quadrant of unspecified female breast: Secondary | ICD-10-CM | POA: Diagnosis not present

## 2011-08-09 DIAGNOSIS — Z853 Personal history of malignant neoplasm of breast: Secondary | ICD-10-CM

## 2011-08-09 DIAGNOSIS — R51 Headache: Secondary | ICD-10-CM | POA: Diagnosis not present

## 2011-08-09 DIAGNOSIS — M549 Dorsalgia, unspecified: Secondary | ICD-10-CM

## 2011-08-09 DIAGNOSIS — M1710 Unilateral primary osteoarthritis, unspecified knee: Secondary | ICD-10-CM | POA: Diagnosis not present

## 2011-08-09 DIAGNOSIS — R110 Nausea: Secondary | ICD-10-CM | POA: Diagnosis not present

## 2011-08-09 DIAGNOSIS — IMO0002 Reserved for concepts with insufficient information to code with codable children: Secondary | ICD-10-CM | POA: Diagnosis not present

## 2011-08-09 LAB — CBC WITH DIFFERENTIAL/PLATELET
Basophils Absolute: 0 10*3/uL (ref 0.0–0.1)
Eosinophils Absolute: 0.2 10*3/uL (ref 0.0–0.5)
HCT: 34.8 % (ref 34.8–46.6)
LYMPH%: 21.5 % (ref 14.0–49.7)
MCV: 90.3 fL (ref 79.5–101.0)
MONO#: 0.5 10*3/uL (ref 0.1–0.9)
MONO%: 8.4 % (ref 0.0–14.0)
NEUT#: 3.8 10*3/uL (ref 1.5–6.5)
NEUT%: 66 % (ref 38.4–76.8)
Platelets: 217 10*3/uL (ref 145–400)
RBC: 3.85 10*6/uL (ref 3.70–5.45)

## 2011-08-09 LAB — COMPREHENSIVE METABOLIC PANEL
AST: 24 U/L (ref 0–37)
Albumin: 4.1 g/dL (ref 3.5–5.2)
Alkaline Phosphatase: 75 U/L (ref 39–117)
BUN: 15 mg/dL (ref 6–23)
CO2: 29 mEq/L (ref 19–32)
Chloride: 99 mEq/L (ref 96–112)
Creatinine, Ser: 0.81 mg/dL (ref 0.50–1.10)
Glucose, Bld: 109 mg/dL — ABNORMAL HIGH (ref 70–99)
Sodium: 133 mEq/L — ABNORMAL LOW (ref 135–145)
Total Bilirubin: 0.4 mg/dL (ref 0.3–1.2)

## 2011-08-09 LAB — CANCER ANTIGEN 27.29: CA 27.29: 18 U/mL (ref 0–39)

## 2011-08-09 MED ORDER — GADOBENATE DIMEGLUMINE 529 MG/ML IV SOLN
20.0000 mL | Freq: Once | INTRAVENOUS | Status: AC | PRN
  Administered 2011-08-09: 20 mL via INTRAVENOUS

## 2011-08-09 MED ORDER — VENLAFAXINE HCL ER 75 MG PO CP24: 75.0000 mg | ORAL_CAPSULE | Freq: Every day | ORAL | Status: DC

## 2011-08-09 MED ORDER — DEXAMETHASONE 4 MG PO TABS: 8.0000 mg | ORAL_TABLET | Freq: Two times a day (BID) | ORAL | Status: AC

## 2011-08-09 MED ORDER — LORAZEPAM 0.5 MG PO TABS: 0.5000 mg | ORAL_TABLET | Freq: Every day | ORAL | Status: DC

## 2011-08-09 NOTE — Telephone Encounter (Signed)
gve the pt her July 2013 appt calendar along with the sept appt calendar

## 2011-08-09 NOTE — Progress Notes (Signed)
ID: [name]   DOB: 05/16/1945  MR#: [id number]  WUJ#:[id number]  HISTORY OF PRESENT ILLNESS: Shannon [name] had a negative screening mammogram in July of 2010.  About a month ago though she felt a change in the lower aspect of the right breast and brought this to Dr. Tenna [name] attention.  He set her up for a diagnostic mammography on April 7 and Dr. Renato [name] found an interval mass with ill defined margins, deep in the lower, inner right breast.  This was palpable at the 3:30 position, approximately 9 cm from the nipple.  There were no palpable right axillary lymph nodes and by ultrasound there were no abnormal lymph nodes in the right axilla.  The ultrasound did show a 1.7 cm, irregular hypoechoic mass, just beneath the skin.  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  The tumor cells were estrogen receptor and progesterone receptor negative, both at 0%.  The MIB-1 was elevated at 57% and the HER2/neu ratio by CISH was positive at 2.74. Her subsequent history is as detailed below  INTERVAL HISTORY: Shannon [name] returns today for an unscheduled visit. She has been having headaches accompanied by nausea and this has her and assist and me) concerned. The only other interval history of significant is that her son, who has Down syndrome, is now also becoming she tells me demented. She still planning to keep them at home "no matter what".  REVIEW OF SYSTEMS: The headaches are pretty much they're all the time. You do wake her up at night. They last for a today. They are accompanied sometimes by nausea, sometimes by little bit of blurred vision. There have been no falls or dizziness. There is no photophobia or stiff neck. Aside from this issue, she has significant problems with the right rotator cuff and her knees and this is being followed by or. She has some TMJ symptoms, problems with  deconditioning, with shortness of breath climbing stairs, Some forgetfulness and anxiety, but otherwise a detailed review of systems was stable.  PAST MEDICAL HISTORY: Past Medical History  Diagnosis Date  . CARDIOMYOPATHY, SECONDARY 03/08/2010  . CHEST PAIN UNSPECIFIED 03/08/2010  . DYSPNEA ON EXERTION 02/10/2009  . GERD 08/11/2008  . HYPERLIPIDEMIA 08/11/2008  . HYPERTENSION 08/11/2008  . LIPOMA 02/10/2009  . OSTEOPENIA 07/2007  . PREDIABETES 06/15/2009  . 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 Carpal tunnel syndrome of left wrist   . Neuropathy   . Craniopharyngioma 2000  . Interstitial cystitis   . Brain tumor   . DEPRESSION 08/11/2008  . Neuropathy   . IC (interstitial cystitis)   . 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 Arthritis   . Asthma   . Breast cancer     right  . Melanoma 2006    neck  . Breast CA   . Melanoma 2008    DR. Yetta [name]  . Osteoporosis   Significant for history of melanoma removed from the patient's anterior chest about five years ago by Shannon [name].  We will try to get that report.  She also had a craniopharyngioma removed by Dr. Shirlean [name] about 11 years ago.  She required no radiation for this (she presented with headaches and amaurosis of the right eye).  She has just undergone carpal tunnel  release on the left by Dr. Josephine [name] and is recovering well from that.  She had a lipoma removed from her right thigh by Dr. Jamey [name].  She has a history of cholecystectomy, a history of prior breast cyst removal, a history of interstitial cystitis, followed by Dr. Logan [name], history of hypertension, history of hypercholesterolemia, history of mild GERD/HH, history of childhood asthma, resolved, history of osteoarthritis involving both knees followed by Dr. Ollen [name] and history of osteopenias noted on bone density obtained 2009.  PAST SURGICAL HISTORY: Past Surgical History  Procedure Date   . Cholecystectomy   . Carpal tunnel release     L wrist  . Elbow surgery     left  . Tubal ligation 1997  . Lipoma excision 03/28/2009    right leg  . Portacath placement may 2011  . Craniotomy for tumor 2000  . Tonsillectomy 1958  . Melanoma excision   . Porta cath   . Port-a-cath removal 11/30/2010    Shannon [name]  . Breast lumpectomy 04/2009    RIGHT FOR BREAST CANCER-CHEMO/RADIATION X 1 YEAR    FAMILY HISTORY Family History  Problem Relation Age of Onset  . Breast cancer Mother   . Lung cancer Mother   . Cancer Mother     sarcoma  . Hypertension Mother   . Colon cancer Neg Hx   . Prostate cancer Father   . Heart disease Father   . Cancer Father   . Prostate cancer Brother   . Cancer Brother     PROSTATE  . Stomach cancer Maternal Aunt   . Uterine cancer Maternal Aunt   . Down syndrome Son   The patient's father is alive at age 57.  He lives in a residential home, but the patient consider herself as his primary caregiver.  The patient's mother had breast cancer diagnosed at the age of 39.  She developed lower extremity sarcoma 10 years later, which eventually took her life.  The patient has one brother and one sister both in good health.  GYNECOLOGIC HISTORY: She is P3, first pregnancy to term age 77.  Change of life around age 25.  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  Her husband Shannon [name] is an English as a second language teacher.  Daughter Shannon [name] a Engineer, civil (consulting) at [employer] in [place]  Son Shannon [name]  works as an Radio broadcast assistant, actually not with his dad.  The third [name] is Shannon [name], lives at home, has Down's syndrome.  The patient attends [employer].   ADVANCED DIRECTIVES:  HEALTH MAINTENANCE: History  Substance Use Topics  . Smoking status: Never Smoker   . Smokeless tobacco: Never Used  .  Alcohol Use: No     Colonoscopy:  PAP:  Bone density:  Lipid panel:  No Known Allergies  Current Outpatient Prescriptions  Medication Sig Dispense Refill  . aspirin 81 MG tablet Take 81 mg by mouth daily.        Marland Kitchen b complex vitamins tablet Take 1 tablet by mouth daily.        . carvedilol (COREG) 6.25 MG tablet TAKE 1 TABLET (6.25 MG TOTAL) BY MOUTH 2 (TWO) TIMES DAILY WITH A MEAL.  60 tablet  6  . cholecalciferol (VITAMIN D) 400 UNITS TABS Take 2,000 Units by mouth daily.      Marland Kitchen esomeprazole (NEXIUM) 40 MG  capsule Take 1 capsule (40 mg total) by mouth daily before breakfast.  30 capsule  5  . LORazepam (ATIVAN) 0.5 MG tablet Take 1 tablet (0.5 mg total) by mouth at bedtime.  90 tablet  1  . Misc Natural Products (COSAMIN ASU ADVANCED FORMULA PO) Take 2 tablets by mouth daily.        . Multiple Vitamins-Minerals (CENTRUM SILVER PO) Take 1 tablet by mouth daily.       . pentosan polysulfate (ELMIRON) 100 MG capsule Take 200 mg by mouth 2 (two) times daily.       . Risedronate Sodium (ATELVIA) 35 MG TBEC Take 1 tablet (35 mg total) by mouth once a week.  4 tablet  12  . rosuvastatin (CRESTOR) 20 MG tablet Take 20 mg by mouth daily.        Marland Kitchen telmisartan (MICARDIS) 40 MG tablet Take 40 mg by mouth daily.        Marland Kitchen venlafaxine XR (EFFEXOR-XR) 75 MG 24 hr capsule Take 1 capsule (75 mg total) by mouth daily.  90 capsule  12  . vitamin E 200 UNIT capsule Take 200 Units by mouth daily.      Marland Kitchen DISCONTD: venlafaxine XR (EFFEXOR-XR) 75 MG 24 hr capsule TAKE ONE CAPSULE BY MOUTH EVERY DAY  15 capsule  0  . Calcium Carbonate-Vitamin D (CALCIUM-VITAMIN D) 600-200 MG-UNIT CAPS Take 1 tablet by mouth 2 (two) times daily.        Marland Kitchen dexamethasone (DECADRON) 4 MG tablet Take 2 tablets (8 mg total) by mouth 2 (two) times daily with a meal.  30 tablet  3  . hydrOXYzine (ATARAX) 25 MG tablet Take 25 mg by mouth daily.        . sucralfate (CARAFATE) 1 G tablet Take 1 g by mouth as needed. indigestion        Current Facility-Administered Medications  Medication Dose Route Frequency Provider Last Rate Last Dose  . nystatin cream (MYCOSTATIN)   Topical BID [name], NP        OBJECTIVE: Middle-aged white woman who appears anxious Filed Vitals:   08/09/11 1022  BP: 128/82  Pulse: 84  Temp: 98.3 F (36.8 C)     Body mass index is 37.25 kg/(m^2).    ECOG FS: 1  Sclerae unicteric Oropharynx clear No cervical or supraclavicular adenopathy Lungs no rales or rhonchi Heart regular rate and rhythm Abd benign MSK no focal spinal tenderness, no peripheral edema Neuro: nonfocal Breasts: The right breast is status post lumpectomy, with no evidence of local recurrence. Left breast is unremarkable.  LAB RESULTS: Lab Results  Component Value Date   WBC 7.4 04/19/2011   NEUTROABS 4.9 04/19/2011   HGB 12.3 04/19/2011   HCT 37.2 04/19/2011   MCV 87.0 04/19/2011   PLT 287 04/19/2011      Chemistry      Component Value Date/Time   NA 142 04/19/2011 1220   NA 144 10/20/2010 0854   K 4.0 04/19/2011 1220   K 4.2 10/20/2010 0854   CL 108 04/19/2011 1220   CL 108 10/20/2010 0854   CO2 24 04/19/2011 1220   CO2 26 10/20/2010 0854   BUN 17 04/19/2011 1220   BUN 13 10/20/2010 0854   CREATININE 0.94 04/19/2011 1220   CREATININE 0.9 10/20/2010 0854      Component Value Date/Time   CALCIUM 9.1 04/19/2011 1220   CALCIUM 9.3 03/08/2011 1447   CALCIUM 8.8 10/20/2010 0854   ALKPHOS 115 04/19/2011  1220   ALKPHOS 106* 10/20/2010 0854   AST 20 04/19/2011 1220   AST 32 10/20/2010 0854   ALT 23 04/19/2011 1220   BILITOT 0.4 04/19/2011 1220   BILITOT 0.50 10/20/2010 0854       Lab Results  Component Value Date   LABCA2 15 04/19/2011    No components found with this basename: [id number]    No results found for this basename: INR:1;PROTIME:1 in the last 168 hours  Urinalysis    Component Value Date/Time   COLORURINE LT. YELLOW 06/15/2010 1048   APPEARANCEUR CLEAR 06/15/2010 1048   LABSPEC <=1.005 06/15/2010 1048    PHURINE 6.0 06/15/2010 1048   GLUCOSEU NEGATIVE 10/05/2009 0925   HGBUR NEGATIVE 06/15/2010 1048   BILIRUBINUR NEGATIVE 06/15/2010 1048   KETONESUR NEGATIVE 06/15/2010 1048   PROTEINUR NEGATIVE 10/05/2009 0925   UROBILINOGEN 0.2 06/15/2010 1048   NITRITE NEGATIVE 06/15/2010 1048   LEUKOCYTESUR NEGATIVE 06/15/2010 1048    STUDIES: Mm Digital Diagnostic Bilat  08/08/2011  *RADIOLOGY REPORT*  Clinical Data:  The patient underwent right lumpectomy, chemotherapy and radiation therapy for breast cancer in 2011.  DIGITAL DIAGNOSTIC BILATERAL MAMMOGRAM WITH CAD  Comparison:  08/04/2010, 05/06/2009  Findings:  The breast tissue is almost entirely fatty.  Lumpectomy changes are noted in the right lower inner quadrant posteriorly. There is a coarse benign dystrophic calcification in the lumpectomy site.  There is no suspicious dominant mass, nonsurgical architectural distortion or calcification to suggest malignancy. Mammographic images were processed with CAD.  IMPRESSION: No mammographic evidence of malignancy.  RECOMMENDATION: Yearly diagnostic mammography is suggested.  BI-RADS CATEGORY 2:  Benign finding(s).  Original Report Authenticated By: Daryl Eastern, M.D.    ASSESSMENT: 66 y.o. [facility] woman status post right breast biopsy April 2011 for a clinical T2 N0 grade 3 invasive ductal carcinoma which was estrogen and progesterone receptor negative, HER-2/neu positive with an MIB-1 of 58%  (1) treated neoadjuvantly with multiple agents (due to tolerance issues):  Namely 2 cycles of standard dose paclitaxel/ carboplatin/ trastuzumab followed by 3 cycles of paclitaxel/ trastuzumab given weekly, the last of this given with weekly carboplatin, followed by 2 cycles of dose dense doxorubicin and cyclophosphamide with the doxorubicin given by continuous infusion all of this completed in August 2011  (2)status post definitive right lumpectomy and sentinel lymph node sampling September 2011 showing a complete  pathologic response both in the breast and sampled lymph nodes.    (3) completed radiation therapy November 2011.    (4) continued to receive trastuzumab until January 2012 at which time it was discontinued secondary to a drop in her ejection fraction which has now completely resolved.   PLAN: I am concerned that indeed she may be having metastatic disease to the brain and I have set her up for an MRI of the brain this evening. I went ahead and started her on Decadron and she received 10 mg orally in our office. She has a prescription for Decadron to take 8 mg twice a day with food until we have an answer out to the reason for her headaches. She will call tomorrow morning for a reading on her MRI and I have tentatively made her a return appointment to see me on July 12 in case the MRI is positive. If the MRI is negative she will see me instead in September, and before that visit she will have a CT scan of the chest for restaging purposes.   Caleb Prigmore C    08/09/2011

## 2011-08-10 ENCOUNTER — Telehealth: Payer: Self-pay

## 2011-08-10 NOTE — Telephone Encounter (Signed)
Called pt to inform her per Dr. Darnelle Catalan, MRI was fine, showed no brain metastasis, so he is not sure the exact cause of her headaches, but she can take aleve, and can cancel appt tomorrow.  Pt verbalizes understanding and requests copy of report be mailed to her.  Address confirmed verbally.  Onc tx schedule sent to cancel appt.  Informed pt to call office if needed, before next appt.

## 2011-08-10 NOTE — Telephone Encounter (Addendum)
Ms. Lauro is calling for the results of the MRI done 08-09-11.

## 2011-08-11 ENCOUNTER — Ambulatory Visit: Payer: Medicare Other | Admitting: Oncology

## 2011-08-14 ENCOUNTER — Other Ambulatory Visit: Payer: Self-pay | Admitting: Gastroenterology

## 2011-08-24 ENCOUNTER — Telehealth: Payer: Self-pay | Admitting: Family Medicine

## 2011-08-24 NOTE — Telephone Encounter (Signed)
Patient would like to switch to Dr Abner Greenspan at [facility] since she lives "right around the corner". Is it okay? Patient does feel like she needs to have bloodwork for cholesterol.

## 2011-08-24 NOTE — Telephone Encounter (Signed)
Yes this is OK 

## 2011-08-24 NOTE — Telephone Encounter (Signed)
OK 

## 2011-08-28 ENCOUNTER — Encounter: Payer: Self-pay | Admitting: Family Medicine

## 2011-08-28 ENCOUNTER — Ambulatory Visit (INDEPENDENT_AMBULATORY_CARE_PROVIDER_SITE_OTHER): Payer: Medicare Other | Admitting: Family Medicine

## 2011-08-28 VITALS — BP 116/83 | HR 85 | Temp 98.1°F | Resp 16 | Ht 63.0 in | Wt 206.2 lb

## 2011-08-28 DIAGNOSIS — E871 Hypo-osmolality and hyponatremia: Secondary | ICD-10-CM

## 2011-08-28 DIAGNOSIS — I429 Cardiomyopathy, unspecified: Secondary | ICD-10-CM

## 2011-08-28 DIAGNOSIS — K219 Gastro-esophageal reflux disease without esophagitis: Secondary | ICD-10-CM

## 2011-08-28 DIAGNOSIS — D649 Anemia, unspecified: Secondary | ICD-10-CM

## 2011-08-28 DIAGNOSIS — R1013 Epigastric pain: Secondary | ICD-10-CM | POA: Diagnosis not present

## 2011-08-28 DIAGNOSIS — I10 Essential (primary) hypertension: Secondary | ICD-10-CM | POA: Diagnosis not present

## 2011-08-28 DIAGNOSIS — IMO0001 Reserved for inherently not codable concepts without codable children: Secondary | ICD-10-CM

## 2011-08-28 HISTORY — DX: Anemia, unspecified: D64.9

## 2011-08-28 HISTORY — DX: Hypo-osmolality and hyponatremia: E87.1

## 2011-08-28 LAB — LIPID PANEL
Cholesterol: 190 mg/dL (ref 0–200)
HDL: 50.2 mg/dL (ref >39.00)
Total CHOL/HDL Ratio: 4
VLDL: 50.6 mg/dL — ABNORMAL HIGH (ref 0.0–40.0)

## 2011-08-28 LAB — HEPATIC FUNCTION PANEL
ALT: 32 U/L (ref 0–35)
AST: 36 U/L (ref 0–37)
Bilirubin, Direct: 0 mg/dL (ref 0.0–0.3)
Total Bilirubin: 0.5 mg/dL (ref 0.3–1.2)

## 2011-08-28 LAB — RENAL FUNCTION PANEL
CO2: 29 mEq/L (ref 19–32)
Calcium: 9.6 mg/dL (ref 8.4–10.5)
GFR: 61.57 mL/min (ref >60.00)
Glucose, Bld: 113 mg/dL — ABNORMAL HIGH (ref 70–99)
Potassium: 4.6 mEq/L (ref 3.5–5.1)
Sodium: 138 mEq/L (ref 135–145)

## 2011-08-28 LAB — CBC
RBC: 4.11 Mil/uL (ref 3.87–5.11)
RDW: 14.4 % (ref 11.5–14.6)
WBC: 4.3 10*3/uL — ABNORMAL LOW (ref 4.5–10.5)

## 2011-08-28 MED ORDER — RANITIDINE HCL 300 MG PO TABS: 300.0000 mg | ORAL_TABLET | Freq: Every day | ORAL | Status: DC

## 2011-08-28 MED ORDER — ALIGN PO CAPS: 1.0000 | ORAL_CAPSULE | Freq: Every day | ORAL | Status: DC

## 2011-08-28 MED ORDER — OMEPRAZOLE 40 MG PO CPDR: 40.0000 mg | DELAYED_RELEASE_CAPSULE | Freq: Every day | ORAL | Status: DC

## 2011-08-28 NOTE — Assessment & Plan Note (Signed)
Mild, repeat cbc today 

## 2011-08-28 NOTE — Assessment & Plan Note (Signed)
Was in to see her oncologist earlier this month with worsening headaches. MRI revealed no concerning new lesions but lab work did reveal some hyponatremia and anemia. Her headaches are resolved. We'll keep a renal panel today to further evaluate

## 2011-08-28 NOTE — Assessment & Plan Note (Signed)
Recent flare in symptoms with increased belching and bloating. Dyspepsia. She's requesting a switch from Nexium for financial reasons so we will start on omeprazole 40 mg every morning and will also give her ranitidine 300 mg each bedtime. Symptoms suggestive of a sudden onset illness. Suggest 24 hours of clear liquids and then progress to Brat diet and then as tolerated. Start probiotic and check an H. pylori reassess at next visit

## 2011-08-28 NOTE — Patient Instructions (Addendum)
Gastritis Gastritis is an inflammation (the body's way of reacting to injury and/or infection) of the stomach. It is often caused by viral or bacterial (germ) infections. It can also be caused by chemicals (including alcohol) and medications. This illness may be associated with generalized malaise (feeling tired, not well), cramps, and fever. The illness may last 2 to 7 days. If symptoms of gastritis continue, gastroscopy (looking into the stomach with a telescope-like instrument), biopsy (taking tissue samples), and/or blood tests may be necessary to determine the cause. Antibiotics will not affect the illness unless there is a bacterial infection present. One common bacterial cause of gastritis is an organism known as H. Pylori. This can be treated with antibiotics. Other forms of gastritis are caused by too much acid in the stomach. They can be treated with medications such as H2 blockers and antacids. Home treatment is usually all that is needed. Young children will quickly become dehydrated (loss of body fluids) if vomiting and diarrhea are both present. Medications may be given to control nausea. Medications are usually not given for diarrhea unless especially bothersome. Some medications slow the removal of the virus from the gastrointestinal tract. This slows down the healing process. HOME CARE INSTRUCTIONS Home care instructions for nausea and vomiting:  For adults: drink small amounts of fluids often. Drink at least 2 quarts a day. Take sips frequently. Do not drink large amounts of fluid at one time. This may worsen the nausea.   Only take over-the-counter or prescription medicines for pain, discomfort, or fever as directed by your caregiver.   Drink clear liquids only. Those are anything you can see through such as water, broth, or soft drinks.   Once you are keeping clear liquids down, you may start full liquids, soups, juices, and ice cream or sherbet. Slowly add bland (plain, not spicy)  foods to your diet.  Home care instructions for diarrhea:  Diarrhea can be caused by bacterial infections or a virus. Your condition should improve with time, rest, fluids, and/or anti-diarrheal medication.   Until your diarrhea is under control, you should drink clear liquids often in small amounts. Clear liquids include: water, broth, jell-o water and weak tea.  Avoid:  Milk.   Fruits.   Tobacco.   Alcohol.   Extremely hot or cold fluids.   Too much intake of anything at one time.  When your diarrhea stops you may add the following foods, which help the stool to become more formed:  Rice.   Bananas.   Apples without skin.   Dry toast.  Once these foods are tolerated you may add low-fat yogurt and low-fat cottage cheese. They will help to restore the normal bacterial balance in your bowel. Wash your hands well to avoid spreading bacteria (germ) or virus. SEEK IMMEDIATE MEDICAL CARE IF:   You are unable to keep fluids down.   Vomiting or diarrhea become persistent (constant).   Abdominal pain develops, increases, or localizes. (Right sided pain can be appendicitis. Left sided pain in adults can be diverticulitis.)   You develop a fever (an oral temperature above 102 F (38.9 C)).   Diarrhea becomes excessive or contains blood or mucus.   You have excessive weakness, dizziness, fainting or extreme thirst.   You are not improving or you are getting worse.   You have any other questions or concerns.  Document Released: 01/10/2001 Document Revised: 01/05/2011 Document Reviewed: 01/16/2005 [facility] Patient Information 2012 [place], [place].  24 hours clear liquids then progress to Bland/BRAT (  bananas, rice, applesauce, toast, etc) as tolerated

## 2011-08-28 NOTE — Assessment & Plan Note (Signed)
Stable, doing well, no longer taking Herceptin

## 2011-08-28 NOTE — Assessment & Plan Note (Signed)
Adequately controlled despite current illness. No changes

## 2011-08-28 NOTE — Progress Notes (Signed)
Patient ID: Shannon [name], female   DOB: 1945/11/16, 66 y.o.   MRN: [id number] ALIVIA CIMINO [id number] Nov 08, 1945 08/28/2011      Progress Note-Follow Up  Subjective  Chief Complaint  Chief Complaint  Patient presents with  . GI Problem    Pt c/o bloating, gas, indigestion & belching, lightheadedness, nausea x4 days    HPI  Patient is a 66 year old Caucasian female is in today for an urgent visit. She is going to transfer her care here but had not done so as far. Over the last 4-5 days has had increasing GI symptoms. She's complaining of chills, malaise, dyspepsia, belching and bloating. Low-grade abdominal pain has been noted. Some nausea as well but no vomiting. Moves her bowels every other day and that is normal for her. She does note after Pepto-Bismol and/or milk of magnesia her stools are a bit darker but not consistently so. No pain with defecation. Had an upper endoscopy lower endoscopy about 2 years ago she believes was clear. Has had benign polyps in the past. Does note her husband is also struggling with some diarrhea and nausea. So far she's tried Zofran, Zantac, stop date, Pepto-Bismol, milk of magnesia with no relief. No dietary changes she can note. She does note however she is mild burning on the tip of her tongue. Denies any sore throat or sour liquid in her throat in the morning. No acute chest pain, palpitations, shortness of breath, congestion or noted. Earlier this month she did see her oncologist with worsening headaches. MRI was clear. Headaches have resolved.  Past Medical History  Diagnosis Date  . CARDIOMYOPATHY, SECONDARY 03/08/2010  . CHEST PAIN UNSPECIFIED 03/08/2010  . DYSPNEA ON EXERTION 02/10/2009  . GERD 08/11/2008  . HYPERLIPIDEMIA 08/11/2008  . HYPERTENSION 08/11/2008  . LIPOMA 02/10/2009  . OSTEOPENIA 07/2007  . PREDIABETES 06/15/2009  . 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 Carpal tunnel syndrome of left wrist   . Neuropathy   . Craniopharyngioma 2000    . Interstitial cystitis   . Brain tumor   . DEPRESSION 08/11/2008  . Neuropathy   . IC (interstitial cystitis)   . 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 Arthritis   . Asthma   . Breast cancer     right  . Melanoma 2006    neck  . Breast CA   . Melanoma 2008    DR. Yetta Barre  . Osteoporosis     Past Surgical History  Procedure Date  . Cholecystectomy   . Carpal tunnel release     L wrist  . Elbow surgery     left  . Tubal ligation 1997  . Lipoma excision 03/28/2009    right leg  . Portacath placement may 2011  . Craniotomy for tumor 2000  . Tonsillectomy 1958  . Melanoma excision   . Porta cath   . Port-a-cath removal 11/30/2010    Streck  . Breast lumpectomy 04/2009    RIGHT FOR BREAST CANCER-CHEMO/RADIATION X 1 YEAR    Family History  Problem Relation Age of Onset  . Breast cancer Mother   . Lung cancer Mother   . Cancer Mother     sarcoma  . Hypertension Mother   . Colon cancer Neg Hx   . Prostate cancer Father   . Heart disease Father   . Cancer Father   . Prostate cancer  Brother   . Cancer Brother     PROSTATE  . Stomach cancer Maternal Aunt   . Uterine cancer Maternal Aunt   . Down syndrome Son     History   Social History  . Marital Status: Married    Spouse Name: N/A    Number of Children: 3  . Years of Education: N/A   Occupational History  . boutique owner    Social History Main Topics  . Smoking status: Never Smoker   . Smokeless tobacco: Never Used  . Alcohol Use: No  . Drug Use: No  . Sexually Active: Not on file   Other Topics Concern  . Not on file   Social History Narrative  . No narrative on file    Current Outpatient Prescriptions on File Prior to Visit  Medication Sig Dispense Refill  . aspirin 81 MG tablet Take 81 mg by mouth daily.        Marland Kitchen b complex vitamins tablet Take 1 tablet by mouth daily.        . carvedilol (COREG) 6.25 MG tablet TAKE  1 TABLET (6.25 MG TOTAL) BY MOUTH 2 (TWO) TIMES DAILY WITH A MEAL.  60 tablet  6  . cholecalciferol (VITAMIN D) 400 UNITS TABS Take 2,000 Units by mouth daily.      . hydrOXYzine (ATARAX) 25 MG tablet Take 25 mg by mouth daily.        Marland Kitchen LORazepam (ATIVAN) 0.5 MG tablet Take 1 tablet (0.5 mg total) by mouth at bedtime.  90 tablet  1  . Misc Natural Products (COSAMIN ASU ADVANCED FORMULA PO) Take 2 tablets by mouth daily.        . Multiple Vitamins-Minerals (CENTRUM SILVER PO) Take 1 tablet by mouth daily.       . pentosan polysulfate (ELMIRON) 100 MG capsule Take 200 mg by mouth 2 (two) times daily.       . Risedronate Sodium (ATELVIA) 35 MG TBEC Take 1 tablet (35 mg total) by mouth once a week.  4 tablet  12  . rosuvastatin (CRESTOR) 20 MG tablet Take 20 mg by mouth daily.        . sucralfate (CARAFATE) 1 G tablet Take 1 g by mouth as needed. indigestion      . telmisartan (MICARDIS) 40 MG tablet Take 40 mg by mouth daily.        Marland Kitchen venlafaxine XR (EFFEXOR-XR) 75 MG 24 hr capsule Take 1 capsule (75 mg total) by mouth daily.  90 capsule  12  . vitamin E 200 UNIT capsule Take 200 Units by mouth daily.      . Calcium Carbonate-Vitamin D (CALCIUM-VITAMIN D) 600-200 MG-UNIT CAPS Take 1 tablet by mouth 2 (two) times daily.        Marland Kitchen omeprazole (PRILOSEC) 40 MG capsule Take 1 capsule (40 mg total) by mouth daily.  30 capsule  3  . ranitidine (ZANTAC) 300 MG tablet Take 1 tablet (300 mg total) by mouth at bedtime.  30 tablet  1   Current Facility-Administered Medications on File Prior to Visit  Medication Dose Route Frequency Provider Last Rate Last Dose  . nystatin cream (MYCOSTATIN)   Topical BID [name], NP        No Known Allergies  Review of Systems  Review of Systems  Constitutional: Positive for chills. Negative for fever and malaise/fatigue.  HENT: Negative for congestion and sore throat.   Eyes: Negative for discharge.  Respiratory: Negative for cough  and shortness of breath.    Cardiovascular: Negative for chest pain, palpitations and leg swelling.  Gastrointestinal: Positive for heartburn, nausea and abdominal pain. Negative for vomiting, diarrhea, constipation and blood in stool.  Genitourinary: Negative for dysuria.  Musculoskeletal: Positive for joint pain. Negative for falls.       Knee pain so far has not responded to 3 recent synvisc shots  Skin: Negative for rash.  Neurological: Negative for loss of consciousness and headaches.  Endo/Heme/Allergies: Negative for polydipsia.  Psychiatric/Behavioral: Negative for depression and suicidal ideas. The patient has insomnia. The patient is not nervous/anxious.     Objective  BP 116/83  Pulse 85  Temp 98.1 F (36.7 C) (Oral)  Resp 16  Ht 5\' 3"  (1.6 m)  Wt 206 lb 4 oz (93.554 kg)  BMI 36.54 kg/m2  SpO2 99%  LMP 01/30/1994  Physical Exam  Physical Exam  Constitutional: She is oriented to person, place, and time and well-developed, well-nourished, and in no distress. No distress.  HENT:  Head: Normocephalic and atraumatic.  Eyes: Conjunctivae are normal.  Neck: Neck supple. No thyromegaly present.  Cardiovascular: Normal rate, regular rhythm and normal heart sounds.   No murmur heard. Pulmonary/Chest: Effort normal and breath sounds normal. She has no wheezes.  Abdominal: She exhibits no distension and no mass.  Musculoskeletal: She exhibits no edema.  Lymphadenopathy:    She has no cervical adenopathy.  Neurological: She is alert and oriented to person, place, and time.  Skin: Skin is warm and dry. No rash noted. She is not diaphoretic.  Psychiatric: Memory, affect and judgment normal.    Lab Results  Component Value Date   TSH 1.603 03/08/2011   Lab Results  Component Value Date   WBC 5.7 08/09/2011   HGB 11.4* 08/09/2011   HCT 34.8 08/09/2011   MCV 90.3 08/09/2011   PLT 217 08/09/2011   Lab Results  Component Value Date   CREATININE 0.81 08/09/2011   BUN 15 08/09/2011   NA 133* 08/09/2011    K 3.9 08/09/2011   CL 99 08/09/2011   CO2 29 08/09/2011   Lab Results  Component Value Date   ALT 26 08/09/2011   AST 24 08/09/2011   ALKPHOS 75 08/09/2011   BILITOT 0.4 08/09/2011   Lab Results  Component Value Date   CHOL 133 06/09/2009   Lab Results  Component Value Date   HDL 38.60* 06/09/2009   Lab Results  Component Value Date   LDLCALC 62 06/09/2009   Lab Results  Component Value Date   TRIG 164.0* 06/09/2009   Lab Results  Component Value Date   CHOLHDL 3 06/09/2009     Assessment & Plan  CARDIOMYOPATHY, SECONDARY Stable, doing well, no longer taking Herceptin  HYPERTENSION Adequately controlled despite current illness. No changes  GERD Recent flare in symptoms with increased belching and bloating. Dyspepsia. She's requesting a switch from Nexium for financial reasons so we will start on omeprazole 40 mg every morning and will also give her ranitidine 300 mg each bedtime. Symptoms suggestive of a sudden onset illness. Suggest 24 hours of clear liquids and then progress to Brat diet and then as tolerated. Start probiotic and check an H. pylori reassess at next visit  Hyponatremia Was in to see her oncologist earlier this month with worsening headaches. MRI revealed no concerning new lesions but lab work did reveal some hyponatremia and anemia. Her headaches are resolved. We'll keep a renal panel today to further evaluate  Anemia Mild,  repeat cbc today

## 2011-09-04 ENCOUNTER — Ambulatory Visit (INDEPENDENT_AMBULATORY_CARE_PROVIDER_SITE_OTHER): Payer: Medicare Other | Admitting: Family Medicine

## 2011-09-04 ENCOUNTER — Encounter: Payer: Self-pay | Admitting: Family Medicine

## 2011-09-04 ENCOUNTER — Other Ambulatory Visit: Payer: Self-pay

## 2011-09-04 VITALS — BP 131/84 | HR 79 | Temp 97.8°F | Ht 63.0 in | Wt 209.1 lb

## 2011-09-04 DIAGNOSIS — L309 Dermatitis, unspecified: Secondary | ICD-10-CM | POA: Insufficient documentation

## 2011-09-04 DIAGNOSIS — I10 Essential (primary) hypertension: Secondary | ICD-10-CM | POA: Diagnosis not present

## 2011-09-04 DIAGNOSIS — Z23 Encounter for immunization: Secondary | ICD-10-CM

## 2011-09-04 DIAGNOSIS — G4700 Insomnia, unspecified: Secondary | ICD-10-CM | POA: Diagnosis not present

## 2011-09-04 DIAGNOSIS — R7309 Other abnormal glucose: Secondary | ICD-10-CM

## 2011-09-04 DIAGNOSIS — K219 Gastro-esophageal reflux disease without esophagitis: Secondary | ICD-10-CM

## 2011-09-04 DIAGNOSIS — M1990 Unspecified osteoarthritis, unspecified site: Secondary | ICD-10-CM

## 2011-09-04 DIAGNOSIS — E785 Hyperlipidemia, unspecified: Secondary | ICD-10-CM

## 2011-09-04 DIAGNOSIS — I429 Cardiomyopathy, unspecified: Secondary | ICD-10-CM | POA: Diagnosis not present

## 2011-09-04 DIAGNOSIS — D649 Anemia, unspecified: Secondary | ICD-10-CM

## 2011-09-04 DIAGNOSIS — E871 Hypo-osmolality and hyponatremia: Secondary | ICD-10-CM

## 2011-09-04 MED ORDER — PNEUMOCOCCAL VAC POLYVALENT 25 MCG/0.5ML IJ INJ: 0.5000 mL | INJECTION | INTRAMUSCULAR | Status: AC

## 2011-09-04 MED ORDER — HYDROXYZINE HCL 25 MG PO TABS: 25.0000 mg | ORAL_TABLET | Freq: Every day | ORAL | Status: DC

## 2011-09-04 MED ORDER — LOSARTAN POTASSIUM 100 MG PO TABS: 100.0000 mg | ORAL_TABLET | Freq: Every day | ORAL | Status: DC

## 2011-09-04 NOTE — Patient Instructions (Addendum)
Preventive Care for Adults, Female A healthy lifestyle and preventive care can promote health and wellness. Preventive health guidelines for women include the following key practices.  A routine yearly physical is a good way to check with your caregiver about your health and preventive screening. It is a chance to share any concerns and updates on your health, and to receive a thorough exam.   Visit your dentist for a routine exam and preventive care every 6 months. Brush your teeth twice a day and floss once a day. Good oral hygiene prevents tooth decay and gum disease.   The frequency of eye exams is based on your age, health, family medical history, use of contact lenses, and other factors. Follow your caregiver's recommendations for frequency of eye exams.   Eat a healthy diet. Foods like vegetables, fruits, whole grains, low-fat dairy products, and lean protein foods contain the nutrients you need without too many calories. Decrease your intake of foods high in solid fats, added sugars, and salt. Eat the right amount of calories for you.Get information about a proper diet from your caregiver, if necessary.   Regular physical exercise is one of the most important things you can do for your health. Most adults should get at least 150 minutes of moderate-intensity exercise (any activity that increases your heart rate and causes you to sweat) each week. In addition, most adults need muscle-strengthening exercises on 2 or more days a week.   Maintain a healthy weight. The body mass index (BMI) is a screening tool to identify possible weight problems. It provides an estimate of body fat based on height and weight. Your caregiver can help determine your BMI, and can help you achieve or maintain a healthy weight.For adults 20 years and older:   A BMI below 18.5 is considered underweight.   A BMI of 18.5 to 24.9 is normal.   A BMI of 25 to 29.9 is considered overweight.   A BMI of 30 and above is  considered obese.   Maintain normal blood lipids and cholesterol levels by exercising and minimizing your intake of saturated fat. Eat a balanced diet with plenty of fruit and vegetables. Blood tests for lipids and cholesterol should begin at age 20 and be repeated every 5 years. If your lipid or cholesterol levels are high, you are over 50, or you are at high risk for heart disease, you may need your cholesterol levels checked more frequently.Ongoing high lipid and cholesterol levels should be treated with medicines if diet and exercise are not effective.   If you smoke, find out from your caregiver how to quit. If you do not use tobacco, do not start.   If you are pregnant, do not drink alcohol. If you are breastfeeding, be very cautious about drinking alcohol. If you are not pregnant and choose to drink alcohol, do not exceed 1 drink per day. One drink is considered to be 12 ounces (355 mL) of beer, 5 ounces (148 mL) of wine, or 1.5 ounces (44 mL) of liquor.   Avoid use of street drugs. Do not share needles with anyone. Ask for help if you need support or instructions about stopping the use of drugs.   High blood pressure causes heart disease and increases the risk of stroke. Your blood pressure should be checked at least every 1 to 2 years. Ongoing high blood pressure should be treated with medicines if weight loss and exercise are not effective.   If you are 55 to 67   years old, ask your caregiver if you should take aspirin to prevent strokes.   Diabetes screening involves taking a blood sample to check your fasting blood sugar level. This should be done once every 3 years, after age 45, if you are within normal weight and without risk factors for diabetes. Testing should be considered at a younger age or be carried out more frequently if you are overweight and have at least 1 risk factor for diabetes.   Breast cancer screening is essential preventive care for women. You should practice "breast  self-awareness." This means understanding the normal appearance and feel of your breasts and may include breast self-examination. Any changes detected, no matter how small, should be reported to a caregiver. Women in their 20s and 30s should have a clinical breast exam (CBE) by a caregiver as part of a regular health exam every 1 to 3 years. After age 40, women should have a CBE every year. Starting at age 40, women should consider having a mammography (breast X-ray test) every year. Women who have a family history of breast cancer should talk to their caregiver about genetic screening. Women at a high risk of breast cancer should talk to their caregivers about having magnetic resonance imaging (MRI) and a mammography every year.   The Pap test is a screening test for cervical cancer. A Pap test can show cell changes on the cervix that might become cervical cancer if left untreated. A Pap test is a procedure in which cells are obtained and examined from the lower end of the uterus (cervix).   Women should have a Pap test starting at age 21.   Between ages 21 and 29, Pap tests should be repeated every 2 years.   Beginning at age 30, you should have a Pap test every 3 years as long as the past 3 Pap tests have been normal.   Some women have medical problems that increase the chance of getting cervical cancer. Talk to your caregiver about these problems. It is especially important to talk to your caregiver if a new problem develops soon after your last Pap test. In these cases, your caregiver may recommend more frequent screening and Pap tests.   The above recommendations are the same for women who have or have not gotten the vaccine for human papillomavirus (HPV).   If you had a hysterectomy for a problem that was not cancer or a condition that could lead to cancer, then you no longer need Pap tests. Even if you no longer need a Pap test, a regular exam is a good idea to make sure no other problems are  starting.   If you are between ages 65 and 70, and you have had normal Pap tests going back 10 years, you no longer need Pap tests. Even if you no longer need a Pap test, a regular exam is a good idea to make sure no other problems are starting.   If you have had past treatment for cervical cancer or a condition that could lead to cancer, you need Pap tests and screening for cancer for at least 20 years after your treatment.   If Pap tests have been discontinued, risk factors (such as a new sexual partner) need to be reassessed to determine if screening should be resumed.   The HPV test is an additional test that may be used for cervical cancer screening. The HPV test looks for the virus that can cause the cell changes on the cervix.   The cells collected during the Pap test can be tested for HPV. The HPV test could be used to screen women aged 30 years and older, and should be used in women of any age who have unclear Pap test results. After the age of 30, women should have HPV testing at the same frequency as a Pap test.   Colorectal cancer can be detected and often prevented. Most routine colorectal cancer screening begins at the age of 50 and continues through age 75. However, your caregiver may recommend screening at an earlier age if you have risk factors for colon cancer. On a yearly basis, your caregiver may provide home test kits to check for hidden blood in the stool. Use of a small camera at the end of a tube, to directly examine the colon (sigmoidoscopy or colonoscopy), can detect the earliest forms of colorectal cancer. Talk to your caregiver about this at age 50, when routine screening begins. Direct examination of the colon should be repeated every 5 to 10 years through age 75, unless early forms of pre-cancerous polyps or small growths are found.   Hepatitis C blood testing is recommended for all people born from 1945 through 1965 and any individual with known risks for hepatitis C.    Practice safe sex. Use condoms and avoid high-risk sexual practices to reduce the spread of sexually transmitted infections (STIs). STIs include gonorrhea, chlamydia, syphilis, trichomonas, herpes, HPV, and human immunodeficiency virus (HIV). Herpes, HIV, and HPV are viral illnesses that have no cure. They can result in disability, cancer, and death. Sexually active women aged 25 and younger should be checked for chlamydia. Older women with new or multiple partners should also be tested for chlamydia. Testing for other STIs is recommended if you are sexually active and at increased risk.   Osteoporosis is a disease in which the bones lose minerals and strength with aging. This can result in serious bone fractures. The risk of osteoporosis can be identified using a bone density scan. Women ages 65 and over and women at risk for fractures or osteoporosis should discuss screening with their caregivers. Ask your caregiver whether you should take a calcium supplement or vitamin D to reduce the rate of osteoporosis.   Menopause can be associated with physical symptoms and risks. Hormone replacement therapy is available to decrease symptoms and risks. You should talk to your caregiver about whether hormone replacement therapy is right for you.   Use sunscreen with sun protection factor (SPF) of 30 or more. Apply sunscreen liberally and repeatedly throughout the day. You should seek shade when your shadow is shorter than you. Protect yourself by wearing long sleeves, pants, a wide-brimmed hat, and sunglasses year round, whenever you are outdoors.   Once a month, do a whole body skin exam, using a mirror to look at the skin on your back. Notify your caregiver of new moles, moles that have irregular borders, moles that are larger than a pencil eraser, or moles that have changed in shape or color.   Stay current with required immunizations.   Influenza. You need a dose every fall (or winter). The composition of  the flu vaccine changes each year, so being vaccinated once is not enough.   Pneumococcal polysaccharide. You need 1 to 2 doses if you smoke cigarettes or if you have certain chronic medical conditions. You need 1 dose at age 65 (or older) if you have never been vaccinated.   Tetanus, diphtheria, pertussis (Tdap, Td). Get 1 dose of   Tdap vaccine if you are younger than age 65, are over 65 and have contact with an infant, are a healthcare worker, are pregnant, or simply want to be protected from whooping cough. After that, you need a Td booster dose every 10 years. Consult your caregiver if you have not had at least 3 tetanus and diphtheria-containing shots sometime in your life or have a deep or dirty wound.   HPV. You need this vaccine if you are a woman age 26 or younger. The vaccine is given in 3 doses over 6 months.   Measles, mumps, rubella (MMR). You need at least 1 dose of MMR if you were born in 1957 or later. You may also need a second dose.   Meningococcal. If you are age 19 to 21 and a first-year college student living in a residence hall, or have one of several medical conditions, you need to get vaccinated against meningococcal disease. You may also need additional booster doses.   Zoster (shingles). If you are age 60 or older, you should get this vaccine.   Varicella (chickenpox). If you have never had chickenpox or you were vaccinated but received only 1 dose, talk to your caregiver to find out if you need this vaccine.   Hepatitis A. You need this vaccine if you have a specific risk factor for hepatitis A virus infection or you simply wish to be protected from this disease. The vaccine is usually given as 2 doses, 6 to 18 months apart.   Hepatitis B. You need this vaccine if you have a specific risk factor for hepatitis B virus infection or you simply wish to be protected from this disease. The vaccine is given in 3 doses, usually over 6 months.  Preventive Services /  Frequency Ages 19 to 39  Blood pressure check.** / Every 1 to 2 years.   Lipid and cholesterol check.** / Every 5 years beginning at age 20.   Clinical breast exam.** / Every 3 years for women in their 20s and 30s.   Pap test.** / Every 2 years from ages 21 through 29. Every 3 years starting at age 30 through age 65 or 70 with a history of 3 consecutive normal Pap tests.   HPV screening.** / Every 3 years from ages 30 through ages 65 to 70 with a history of 3 consecutive normal Pap tests.   Hepatitis C blood test.** / For any individual with known risks for hepatitis C.   Skin self-exam. / Monthly.   Influenza immunization.** / Every year.   Pneumococcal polysaccharide immunization.** / 1 to 2 doses if you smoke cigarettes or if you have certain chronic medical conditions.   Tetanus, diphtheria, pertussis (Tdap, Td) immunization. / A one-time dose of Tdap vaccine. After that, you need a Td booster dose every 10 years.   HPV immunization. / 3 doses over 6 months, if you are 26 and younger.   Measles, mumps, rubella (MMR) immunization. / You need at least 1 dose of MMR if you were born in 1957 or later. You may also need a second dose.   Meningococcal immunization. / 1 dose if you are age 19 to 21 and a first-year college student living in a residence hall, or have one of several medical conditions, you need to get vaccinated against meningococcal disease. You may also need additional booster doses.   Varicella immunization.** / Consult your caregiver.   Hepatitis A immunization.** / Consult your caregiver. 2 doses, 6 to 18 months   apart.   Hepatitis B immunization.** / Consult your caregiver. 3 doses usually over 6 months.  Ages 40 to 64  Blood pressure check.** / Every 1 to 2 years.   Lipid and cholesterol check.** / Every 5 years beginning at age 20.   Clinical breast exam.** / Every year after age 40.   Mammogram.** / Every year beginning at age 40 and continuing for as  long as you are in good health. Consult with your caregiver.   Pap test.** / Every 3 years starting at age 30 through age 65 or 70 with a history of 3 consecutive normal Pap tests.   HPV screening.** / Every 3 years from ages 30 through ages 65 to 70 with a history of 3 consecutive normal Pap tests.   Fecal occult blood test (FOBT) of stool. / Every year beginning at age 50 and continuing until age 75. You may not need to do this test if you get a colonoscopy every 10 years.   Flexible sigmoidoscopy or colonoscopy.** / Every 5 years for a flexible sigmoidoscopy or every 10 years for a colonoscopy beginning at age 50 and continuing until age 75.   Hepatitis C blood test.** / For all people born from 1945 through 1965 and any individual with known risks for hepatitis C.   Skin self-exam. / Monthly.   Influenza immunization.** / Every year.   Pneumococcal polysaccharide immunization.** / 1 to 2 doses if you smoke cigarettes or if you have certain chronic medical conditions.   Tetanus, diphtheria, pertussis (Tdap, Td) immunization.** / A one-time dose of Tdap vaccine. After that, you need a Td booster dose every 10 years.   Measles, mumps, rubella (MMR) immunization. / You need at least 1 dose of MMR if you were born in 1957 or later. You may also need a second dose.   Varicella immunization.** / Consult your caregiver.   Meningococcal immunization.** / Consult your caregiver.   Hepatitis A immunization.** / Consult your caregiver. 2 doses, 6 to 18 months apart.   Hepatitis B immunization.** / Consult your caregiver. 3 doses, usually over 6 months.  Ages 65 and over  Blood pressure check.** / Every 1 to 2 years.   Lipid and cholesterol check.** / Every 5 years beginning at age 20.   Clinical breast exam.** / Every year after age 40.   Mammogram.** / Every year beginning at age 40 and continuing for as long as you are in good health. Consult with your caregiver.   Pap test.** /  Every 3 years starting at age 30 through age 65 or 70 with a 3 consecutive normal Pap tests. Testing can be stopped between 65 and 70 with 3 consecutive normal Pap tests and no abnormal Pap or HPV tests in the past 10 years.   HPV screening.** / Every 3 years from ages 30 through ages 65 or 70 with a history of 3 consecutive normal Pap tests. Testing can be stopped between 65 and 70 with 3 consecutive normal Pap tests and no abnormal Pap or HPV tests in the past 10 years.   Fecal occult blood test (FOBT) of stool. / Every year beginning at age 50 and continuing until age 75. You may not need to do this test if you get a colonoscopy every 10 years.   Flexible sigmoidoscopy or colonoscopy.** / Every 5 years for a flexible sigmoidoscopy or every 10 years for a colonoscopy beginning at age 50 and continuing until age 75.   Hepatitis   C blood test.** / For all people born from [birth year] through 1965 and any individual with known risks for hepatitis C.   Osteoporosis screening.** / A one-time screening for women ages 26 and over and women at risk for fractures or osteoporosis.   Skin self-exam. / Monthly.   Influenza immunization.** / Every year.   Pneumococcal polysaccharide immunization.** / 1 dose at age 55 (or older) if you have never been vaccinated.   Tetanus, diphtheria, pertussis (Tdap, Td) immunization. / A one-time dose of Tdap vaccine if you are over 65 and have contact with an infant, are a Research scientist (physical sciences), or simply want to be protected from whooping cough. After that, you need a Td booster dose every 10 years.   Varicella immunization.** / Consult your caregiver.   Start MegaRed krill oil caps daily by [employer] Try Hydrocortisone cream to lesions on skin     Meningococcal immunization.** / Consult your caregiver.   Hepatitis A immunization.** / Consult your caregiver. 2 doses, 6 to 18 months apart.   Hepatitis B immunization.** / Check with your caregiver. 3 doses, usually over 6  months.  ** Family history and personal history of risk and conditions may change your caregiver's recommendations. Document Released: 03/14/2001 Document Revised: 01/05/2011 Document Reviewed: 06/13/2010 [facility] Patient Information 2012 [place], [place].

## 2011-09-05 ENCOUNTER — Ambulatory Visit: Payer: Medicare Other | Admitting: Family Medicine

## 2011-09-06 NOTE — Assessment & Plan Note (Signed)
Stable and following with cardiology

## 2011-09-06 NOTE — Assessment & Plan Note (Addendum)
Adequately controlled, Micardis has become too expensive, so will d/c and start Losartan 100 mg po qd

## 2011-09-06 NOTE — Assessment & Plan Note (Signed)
Well controlled no changes 

## 2011-09-06 NOTE — Assessment & Plan Note (Signed)
Improved with repeat labs

## 2011-09-07 ENCOUNTER — Encounter: Payer: Self-pay | Admitting: Family Medicine

## 2011-09-07 DIAGNOSIS — M1990 Unspecified osteoarthritis, unspecified site: Secondary | ICD-10-CM | POA: Insufficient documentation

## 2011-09-07 HISTORY — DX: Unspecified osteoarthritis, unspecified site: M19.90

## 2011-09-07 NOTE — Assessment & Plan Note (Signed)
Resolved on recent blood work will continue to monitor

## 2011-09-07 NOTE — Assessment & Plan Note (Signed)
tolerting Crestor, avoid trans fats and minimize saturated fats

## 2011-09-07 NOTE — Assessment & Plan Note (Signed)
Underwent Synvisc x 3 without great improvement

## 2011-09-07 NOTE — Assessment & Plan Note (Signed)
Mild glucose intolerance, avoid simple carbs and increase activity

## 2011-09-07 NOTE — Progress Notes (Signed)
Patient ID: Shannon [name], female   DOB: December 30, 1945, 65 y.o.   MRN: [id number] Shannon [name] [id number] 11/13/1945 09/07/2011      Progress Note New Patient  Subjective  Chief Complaint  Chief Complaint  Patient presents with  . Establish Care    transfer from [facility]/ refill BP medication    HPI  Patient is a 66 year old Caucasian female who is intubated establish care and get a refill on her blood pressure meds. Her Micardis has become excessively expensive and she is asking to switch medications. She has tolerated the medication and has no concerns. She does have other concerns. Most of her complaints are related to arthritis and pain. She has ongoing bilateral knee pain and has undergone Synvisc injections without great improvement. Chest with persistent left shoulder pain which she finds tolerable but is present in today. MRI confirmed rotator cuff injury. She notes some recent mildly pruritic lesions. 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 They were able to remove the lesion and the vision returned but unfortunately she's been left with some persistent neuropathy in the tips of her fingers and toes. No chest pain, palpitations, shortness of breath, GI or GU complaints otherwise noted today  Past Medical History  Diagnosis Date  . CARDIOMYOPATHY, SECONDARY 03/08/2010  . CHEST PAIN UNSPECIFIED 03/08/2010  . DYSPNEA ON EXERTION 02/10/2009  . GERD 08/11/2008  . HYPERLIPIDEMIA 08/11/2008  . HYPERTENSION 08/11/2008  . LIPOMA 02/10/2009  . OSTEOPENIA 07/2007  . PREDIABETES 06/15/2009  . 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 Carpal tunnel syndrome of left wrist   . Neuropathy   . Craniopharyngioma 2000  . Interstitial cystitis   . Brain tumor   . DEPRESSION 08/11/2008  . Neuropathy   . IC (interstitial cystitis)   . 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 Arthritis   .  Breast cancer     right  . Melanoma 2006    neck  . Breast CA   . Melanoma 2008    DR. Yetta Barre  . Osteoporosis   . Hyponatremia 08/28/2011  . Anemia 08/28/2011  . History of chicken pox   . H/O measles   . H/O mumps   . Asthma     childhood  . Dermatitis     Past Surgical History  Procedure Date  . Cholecystectomy   . Carpal tunnel release     L wrist, ulnar nerve moved  . Elbow surgery     left  . Tubal ligation 1997  . Lipoma excision 03/28/2009    right leg  . Portacath placement may 2011  . Craniotomy for tumor 2000  . Tonsillectomy 1958  . Melanoma excision   . Porta cath   . Port-a-cath removal 11/30/2010    Streck  . Breast lumpectomy 04/2009    RIGHT FOR BREAST CANCER-CHEMO/RADIATION X 1 YEAR    Family History  Problem Relation Age of Onset  . Breast cancer Mother   . Lung cancer Mother   . Cancer Mother     sarcoma, breast  . Hypertension Mother   . Colon cancer Neg Hx   . Prostate cancer Father   . Heart disease Father     chf  . Cancer Father     prostate  . Prostate cancer Brother   . Cancer Brother  PROSTATE  . Stomach cancer Maternal Aunt   . Uterine cancer Maternal Aunt   . Down syndrome Son   . Heart disease Maternal Grandfather     MI    History   Social History  . Marital Status: Married    Spouse Name: N/A    Number of Children: 3  . Years of Education: N/A   Occupational History  . boutique owner    Social History Main Topics  . Smoking status: Never Smoker   . Smokeless tobacco: Never Used  . Alcohol Use: No  . Drug Use: No  . Sexually Active: No   Other Topics Concern  . Not on file   Social History Narrative  . No narrative on file    Current Outpatient Prescriptions on File Prior to Visit  Medication Sig Dispense Refill  . aspirin 81 MG tablet Take 81 mg by mouth daily.        Marland Kitchen b complex vitamins tablet Take 1 tablet by mouth daily.        . bifidobacterium infantis (ALIGN) capsule Take 1 capsule by  mouth daily.      . carvedilol (COREG) 6.25 MG tablet TAKE 1 TABLET (6.25 MG TOTAL) BY MOUTH 2 (TWO) TIMES DAILY WITH A MEAL.  60 tablet  6  . cholecalciferol (VITAMIN D) 400 UNITS TABS Take 2,000 Units by mouth daily.      Marland Kitchen LORazepam (ATIVAN) 0.5 MG tablet Take 1 tablet (0.5 mg total) by mouth at bedtime.  90 tablet  1  . Misc Natural Products (COSAMIN ASU ADVANCED FORMULA PO) Take 2 tablets by mouth daily.        . Multiple Vitamins-Minerals (CENTRUM SILVER PO) Take 1 tablet by mouth daily.       Marland Kitchen omeprazole (PRILOSEC) 40 MG capsule Take 1 capsule (40 mg total) by mouth daily.  30 capsule  3  . pentosan polysulfate (ELMIRON) 100 MG capsule Take 200 mg by mouth 2 (two) times daily.       . ranitidine (ZANTAC) 300 MG tablet Take 1 tablet (300 mg total) by mouth at bedtime.  30 tablet  1  . Risedronate Sodium (ATELVIA) 35 MG TBEC Take 1 tablet (35 mg total) by mouth once a week.  4 tablet  12  . rosuvastatin (CRESTOR) 20 MG tablet Take 20 mg by mouth daily.        . sucralfate (CARAFATE) 1 G tablet Take 1 g by mouth as needed. indigestion      . venlafaxine XR (EFFEXOR-XR) 75 MG 24 hr capsule Take 1 capsule (75 mg total) by mouth daily.  90 capsule  12  . vitamin E 200 UNIT capsule Take 200 Units by mouth daily.      . Calcium Carbonate-Vitamin D (CALCIUM-VITAMIN D) 600-200 MG-UNIT CAPS Take 1 tablet by mouth 2 (two) times daily.        Marland Kitchen losartan (COZAAR) 100 MG tablet Take 1 tablet (100 mg total) by mouth daily.  90 tablet  3   No current facility-administered medications on file prior to visit.    No Known Allergies  Review of Systems  Review of Systems  Constitutional: Negative for fever, chills and malaise/fatigue.  HENT: Negative for hearing loss, nosebleeds and congestion.   Eyes: Negative for discharge.  Respiratory: Negative for cough, sputum production, shortness of breath and wheezing.   Cardiovascular: Negative for chest pain, palpitations and leg swelling.    Gastrointestinal: Negative for heartburn, nausea, vomiting, abdominal pain,  diarrhea, constipation and blood in stool.  Genitourinary: Negative for dysuria, urgency, frequency and hematuria.  Musculoskeletal: Positive for joint pain. Negative for myalgias, back pain and falls.       Bl knee pain  Skin: Negative for rash.  Neurological: Negative for dizziness, tremors, sensory change, focal weakness, loss of consciousness, weakness and headaches.  Endo/Heme/Allergies: Negative for polydipsia. Does not bruise/bleed easily.  Psychiatric/Behavioral: Negative for depression and suicidal ideas. The patient is not nervous/anxious and does not have insomnia.     Objective  BP 131/84  Pulse 79  Temp 97.8 F (36.6 C) (Temporal)  Ht 5\' 3"  (1.6 m)  Wt 209 lb 1.9 oz (94.856 kg)  BMI 37.04 kg/m2  SpO2 96%  LMP 01/30/1994  Physical Exam  Physical Exam  Constitutional: She is oriented to person, place, and time. She appears well-developed and well-nourished. No distress.  HENT:  Head: Normocephalic and atraumatic.  Right Ear: External ear normal.  Left Ear: External ear normal.  Nose: Nose normal.  Mouth/Throat: Oropharynx is clear and moist.  Eyes: Conjunctivae and EOM are normal. Pupils are equal, round, and reactive to light. No scleral icterus.  Neck: Normal range of motion. Neck supple. No thyromegaly present.  Cardiovascular: Normal rate, regular rhythm, normal heart sounds and intact distal pulses.   Pulmonary/Chest: Effort normal and breath sounds normal. No respiratory distress. She has no wheezes. She has no rales.  Abdominal: Soft. Bowel sounds are normal. She exhibits no distension and no mass. There is no tenderness. There is no rebound and no guarding.  Musculoskeletal: Normal range of motion. She exhibits no tenderness.  Lymphadenopathy:    She has no cervical adenopathy.  Neurological: She is alert and oriented to person, place, and time. She has normal reflexes. No cranial  nerve deficit. Coordination normal.  Skin: Skin is warm and dry. No rash noted. She is not diaphoretic.  Psychiatric: She has a normal mood and affect. Her behavior is normal. Judgment and thought content normal.       Assessment & Plan  CARDIOMYOPATHY, SECONDARY Stable and following with cardiology  GERD Well controlled no changes  HYPERTENSION Adequately controlled, Micardis has become too expensive, so will d/c and start Losartan 100 mg po qd  Hyponatremia Improved with repeat labs  Anemia Resolved on recent blood work will continue to monitor  PREDIABETES Mild glucose intolerance, avoid simple carbs and increase activity  HYPERLIPIDEMIA tolerting Crestor, avoid trans fats and minimize saturated fats  OA (osteoarthritis) Underwent Synvisc x 3 without great improvement

## 2011-09-08 ENCOUNTER — Telehealth: Payer: Self-pay | Admitting: Family Medicine

## 2011-09-08 MED ORDER — TELMISARTAN 80 MG PO TABS: 80.0000 mg | ORAL_TABLET | Freq: Every day | ORAL | Status: DC

## 2011-09-08 MED ORDER — TELMISARTAN 40 MG PO TABS: 40.0000 mg | ORAL_TABLET | Freq: Every day | ORAL | Status: DC

## 2011-09-08 NOTE — Telephone Encounter (Signed)
Please advise 

## 2011-09-08 NOTE — Telephone Encounter (Signed)
Patient needs the Rx to be 80, please send corrected Rx

## 2011-09-08 NOTE — Telephone Encounter (Signed)
New RX sent

## 2011-09-08 NOTE — Telephone Encounter (Signed)
Pt would like to go back on Micardis and would like it sent to [facility]. Patient also made an appt for next Tues. Please advise?

## 2011-09-08 NOTE — Telephone Encounter (Signed)
New Rx sent.

## 2011-09-08 NOTE — Telephone Encounter (Signed)
Caller: Cassity/Patient; Patient Name: Shannon [name]; PCP: Danise Edge; Best Callback Phone Number: [phone] spoke to Diane in office earlier about BP medications; has been using Micardis 80mg  for 6 mos; switched to Losartin, but BP was not controlled and had lower extremity swelling; was approved to go back on Micardis, but rx was called in to [facility] on [address] [phone] for Micardis 40mg ; says she has always used 80mg 

## 2011-09-08 NOTE — Telephone Encounter (Signed)
So this can happen, unfortunately she is on the max dose of the Losartan so her options are to go back to the Micardis or to add a secondary med such as a mild diuretic or beta blocker (like HCTZ or Metoprolol) and either way we should probably recheck her next week. What kind of numbers is she seeing

## 2011-09-12 ENCOUNTER — Ambulatory Visit (INDEPENDENT_AMBULATORY_CARE_PROVIDER_SITE_OTHER): Payer: Medicare Other | Admitting: Family Medicine

## 2011-09-12 ENCOUNTER — Encounter: Payer: Self-pay | Admitting: Family Medicine

## 2011-09-12 VITALS — BP 132/84 | HR 80 | Temp 96.3°F | Ht 63.0 in | Wt 211.0 lb

## 2011-09-12 DIAGNOSIS — J069 Acute upper respiratory infection, unspecified: Secondary | ICD-10-CM | POA: Diagnosis not present

## 2011-09-12 DIAGNOSIS — R7309 Other abnormal glucose: Secondary | ICD-10-CM

## 2011-09-12 DIAGNOSIS — I10 Essential (primary) hypertension: Secondary | ICD-10-CM | POA: Diagnosis not present

## 2011-09-12 DIAGNOSIS — E785 Hyperlipidemia, unspecified: Secondary | ICD-10-CM

## 2011-09-12 DIAGNOSIS — E871 Hypo-osmolality and hyponatremia: Secondary | ICD-10-CM | POA: Diagnosis not present

## 2011-09-12 DIAGNOSIS — D649 Anemia, unspecified: Secondary | ICD-10-CM

## 2011-09-12 MED ORDER — GUAIFENESIN ER 600 MG PO TB12: 600.0000 mg | ORAL_TABLET | Freq: Two times a day (BID) | ORAL | Status: DC

## 2011-09-12 MED ORDER — HYDROCHLOROTHIAZIDE 25 MG PO TABS: 25.0000 mg | ORAL_TABLET | Freq: Every day | ORAL | Status: DC

## 2011-09-12 MED ORDER — TELMISARTAN-HCTZ 40-12.5 MG PO TABS: 2.0000 | ORAL_TABLET | Freq: Every day | ORAL | Status: DC

## 2011-09-12 NOTE — Patient Instructions (Addendum)
Upper Respiratory Infection, Adult An upper respiratory infection (URI) is also known as the common cold. It is often caused by a type of germ (virus). Colds are easily spread (contagious). You can pass it to others by kissing, coughing, sneezing, or drinking out of the same glass. Usually, you get better in 1 or 2 weeks.  HOME CARE   Only take medicine as told by your doctor.   Use a warm mist humidifier or breathe in steam from a hot shower.   Drink enough water and fluids to keep your pee (urine) clear or pale yellow.   Get plenty of rest.   Return to work when your temperature is back to normal or as told by your doctor. You may use a face mask and wash your hands to stop your cold from spreading.  GET HELP RIGHT AWAY IF:   After the first few days, you feel you are getting worse.   You have questions about your medicine.   You have chills, shortness of breath, or brown or red spit (mucus).   You have yellow or brown snot (nasal discharge) or pain in the face, especially when you bend forward.   You have a fever, puffy (swollen) neck, pain when you swallow, or white spots in the back of your throat.   You have a bad headache, ear pain, sinus pain, or chest pain.   You have a high-pitched whistling sound when you breathe in and out (wheezing).   You have a lasting cough or cough up blood.   You have sore muscles or a stiff neck.  MAKE SURE YOU:   Understand these instructions.   Will watch your condition.   Will get help right away if you are not doing well or get worse.  Document Released: 07/05/2007 Document Revised: 01/05/2011 Document Reviewed: 05/23/2010 [facility] Patient Information 2012 [place], [place].  Start Mucinex and if no improvement call for xray and reevaluation

## 2011-09-12 NOTE — Assessment & Plan Note (Signed)
Resolved for now 

## 2011-09-12 NOTE — Progress Notes (Signed)
Patient ID: Shannon [name], female   DOB: 1945-06-25, 66 y.o.   MRN: [id number] KERIGAN NARVAEZ [id number] September 25, 1945 09/12/2011      Progress Note-Follow Up  Subjective  Chief Complaint  Chief Complaint  Patient presents with  . Blood Pressure Check    BP check, ankles swelling onset within last week, chest congestion-onset a few days ago.    HPI  Patient is a 66 year old Caucasian female who is in today for followup on her blood pressure. At her last visit we tried to switch from Cardizem to losartan secondary to copious use with insurance. Unfortunately her blood pressure spiked up and she's developed pedal edema. We switched back to my carbs and her blood pressure is better today but pedal edema is persistent. She reports that the lithium is due for her period she also notes she's been coughing and a little bit wheezing at night but denies chest pain, palpitations, shortness of breath during the day. She denies any significant nasal congestion fevers, chills malaise or signs of systemic illness otherwise. No flare in her heartburn or GU complaints are noted.  Past Medical History  Diagnosis Date  . CARDIOMYOPATHY, SECONDARY 03/08/2010  . CHEST PAIN UNSPECIFIED 03/08/2010  . DYSPNEA ON EXERTION 02/10/2009  . GERD 08/11/2008  . HYPERLIPIDEMIA 08/11/2008  . HYPERTENSION 08/11/2008  . LIPOMA 02/10/2009  . OSTEOPENIA 07/2007  . PREDIABETES 06/15/2009  . 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 Carpal tunnel syndrome of left wrist   . Neuropathy   . Craniopharyngioma 2000  . Interstitial cystitis   . Brain tumor   . DEPRESSION 08/11/2008  . Neuropathy   . IC (interstitial cystitis)   . 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 Arthritis   . Breast cancer     right  . Melanoma 2006    neck  . Breast CA   . Melanoma 2008    DR. Yetta Barre  . Osteoporosis   . Hyponatremia 08/28/2011  . Anemia 08/28/2011  . History of  chicken pox   . H/O measles   . H/O mumps   . Asthma     childhood  . Dermatitis   . OA (osteoarthritis) 09/07/2011    Past Surgical History  Procedure Date  . Cholecystectomy   . Carpal tunnel release     L wrist, ulnar nerve moved  . Elbow surgery     left  . Tubal ligation 1997  . Lipoma excision 03/28/2009    right leg  . Portacath placement may 2011  . Craniotomy for tumor 2000  . Tonsillectomy 1958  . Melanoma excision   . Porta cath   . Port-a-cath removal 11/30/2010    Streck  . Breast lumpectomy 04/2009    RIGHT FOR BREAST CANCER-CHEMO/RADIATION X 1 YEAR    Family History  Problem Relation Age of Onset  . Breast cancer Mother   . Lung cancer Mother   . Cancer Mother     sarcoma, breast  . Hypertension Mother   . Colon cancer Neg Hx   . Prostate cancer Father   . Heart disease Father     chf  . Cancer Father     prostate  . Prostate cancer Brother   . Cancer Brother     PROSTATE  . Stomach cancer Maternal Aunt   . Uterine cancer Maternal Aunt   . Down syndrome  Son   . Heart disease Maternal Grandfather     MI    History   Social History  . Marital Status: Married    Spouse Name: N/A    Number of Children: 3  . Years of Education: N/A   Occupational History  . boutique owner    Social History Main Topics  . Smoking status: Never Smoker   . Smokeless tobacco: Never Used  . Alcohol Use: No  . Drug Use: No  . Sexually Active: No   Other Topics Concern  . Not on file   Social History Narrative  . No narrative on file    Current Outpatient Prescriptions on File Prior to Visit  Medication Sig Dispense Refill  . aspirin 81 MG tablet Take 81 mg by mouth daily.        Marland Kitchen b complex vitamins tablet Take 1 tablet by mouth daily.        . bifidobacterium infantis (ALIGN) capsule Take 1 capsule by mouth daily.      . Calcium Carbonate-Vitamin D (CALCIUM-VITAMIN D) 600-200 MG-UNIT CAPS Take 1 tablet by mouth 2 (two) times daily.        .  carvedilol (COREG) 6.25 MG tablet TAKE 1 TABLET (6.25 MG TOTAL) BY MOUTH 2 (TWO) TIMES DAILY WITH A MEAL.  60 tablet  6  . cholecalciferol (VITAMIN D) 400 UNITS TABS Take 2,000 Units by mouth daily.      . hydrOXYzine (ATARAX/VISTARIL) 25 MG tablet Take 1 tablet (25 mg total) by mouth daily.  30 tablet  3  . LORazepam (ATIVAN) 0.5 MG tablet Take 1 tablet (0.5 mg total) by mouth at bedtime.  90 tablet  1  . Misc Natural Products (COSAMIN ASU ADVANCED FORMULA PO) Take 2 tablets by mouth daily.        . Multiple Vitamins-Minerals (CENTRUM SILVER PO) Take 1 tablet by mouth daily.       Marland Kitchen omeprazole (PRILOSEC) 40 MG capsule Take 1 capsule (40 mg total) by mouth daily.  30 capsule  3  . pentosan polysulfate (ELMIRON) 100 MG capsule Take 200 mg by mouth 2 (two) times daily.       . ranitidine (ZANTAC) 300 MG tablet Take 1 tablet (300 mg total) by mouth at bedtime.  30 tablet  1  . rosuvastatin (CRESTOR) 20 MG tablet Take 20 mg by mouth daily.        . sucralfate (CARAFATE) 1 G tablet Take 1 g by mouth as needed. indigestion      . telmisartan (MICARDIS) 80 MG tablet Take 1 tablet (80 mg total) by mouth daily.  30 tablet  3  . venlafaxine XR (EFFEXOR-XR) 75 MG 24 hr capsule Take 1 capsule (75 mg total) by mouth daily.  90 capsule  12  . vitamin E 200 UNIT capsule Take 200 Units by mouth daily.      . hydrochlorothiazide (HYDRODIURIL) 25 MG tablet Take 1 tablet (25 mg total) by mouth daily.  30 tablet  1  . Risedronate Sodium (ATELVIA) 35 MG TBEC Take 1 tablet (35 mg total) by mouth once a week.  4 tablet  12  . telmisartan-hydrochlorothiazide (MICARDIS HCT) 40-12.5 MG per tablet Take 2 tablets by mouth daily.        No Known Allergies  Review of Systems  Review of Systems  Constitutional: Negative for fever and malaise/fatigue.  HENT: Negative for congestion.   Eyes: Negative for discharge.  Respiratory: Negative for shortness of breath.  Cardiovascular: Positive for leg swelling. Negative for  chest pain and palpitations.  Gastrointestinal: Negative for nausea, abdominal pain and diarrhea.  Genitourinary: Negative for dysuria.  Musculoskeletal: Negative for falls.  Skin: Negative for rash.  Neurological: Negative for loss of consciousness and headaches.  Endo/Heme/Allergies: Negative for polydipsia.  Psychiatric/Behavioral: Negative for depression and suicidal ideas. The patient is not nervous/anxious and does not have insomnia.     Objective  BP 132/84  Pulse 80  Temp 96.3 F (35.7 C)  Ht 5\' 3"  (1.6 m)  Wt 211 lb (95.709 kg)  BMI 37.38 kg/m2  SpO2 97%  LMP 01/30/1994  Physical Exam  Physical Exam  Constitutional: She is oriented to person, place, and time and well-developed, well-nourished, and in no distress. No distress.  HENT:  Head: Normocephalic and atraumatic.  Eyes: Conjunctivae are normal.  Neck: Neck supple. No thyromegaly present.  Cardiovascular: Normal rate, regular rhythm and normal heart sounds.   No murmur heard. Pulmonary/Chest: Effort normal and breath sounds normal. She has no wheezes.  Abdominal: She exhibits no distension and no mass.  Musculoskeletal: She exhibits edema.       Trace pedal edema b/l  Lymphadenopathy:    She has no cervical adenopathy.  Neurological: She is alert and oriented to person, place, and time.  Skin: Skin is warm and dry. No rash noted. She is not diaphoretic.  Psychiatric: Memory, affect and judgment normal.    Lab Results  Component Value Date   TSH 1.65 08/28/2011   Lab Results  Component Value Date   WBC 4.3* 08/28/2011   HGB 12.3 08/28/2011   HCT 37.1 08/28/2011   MCV 90.1 08/28/2011   PLT 254.0 08/28/2011   Lab Results  Component Value Date   CREATININE 1.0 08/28/2011   BUN 11 08/28/2011   NA 138 08/28/2011   K 4.6 08/28/2011   CL 101 08/28/2011   CO2 29 08/28/2011   Lab Results  Component Value Date   ALT 32 08/28/2011   AST 36 08/28/2011   ALKPHOS 75 08/28/2011   BILITOT 0.5 08/28/2011   Lab  Results  Component Value Date   CHOL 190 08/28/2011   Lab Results  Component Value Date   HDL 50.20 08/28/2011   Lab Results  Component Value Date   LDLCALC 62 06/09/2009   Lab Results  Component Value Date   TRIG 253.0* 08/28/2011   Lab Results  Component Value Date   CHOLHDL 4 08/28/2011     Assessment & Plan  HYPERTENSION Micardis 80 mg working better but has been struggling with increased pedal edema since we tried to make the switch to Losartan secondary to her insurance companies insistence. We will add HCTZ 25 mg po daily and check her pressure and renal panel in 1 months time or as needed  PREDIABETES Sugar still running hi, avoid simple carbs and check a hgba1c with next blood draw  Hyponatremia Resolved, repeat renal at next visit.  Anemia Resolved for now  HYPERLIPIDEMIA Avoid trans fats, continue Crestor and start Krill oil caps

## 2011-09-12 NOTE — Assessment & Plan Note (Addendum)
Micardis 80 mg working better but has been struggling with increased pedal edema since we tried to make the switch to Losartan secondary to her insurance companies insistence. We will add HCTZ 25 mg po daily and check her pressure and renal panel in 1 months time or as needed

## 2011-09-12 NOTE — Assessment & Plan Note (Signed)
Sugar still running hi, avoid simple carbs and check a hgba1c with next blood draw

## 2011-09-12 NOTE — Assessment & Plan Note (Signed)
Resolved, repeat renal at next visit.

## 2011-09-12 NOTE — Assessment & Plan Note (Signed)
Avoid trans fats, continue Crestor and start Krill oil caps

## 2011-09-16 DIAGNOSIS — J209 Acute bronchitis, unspecified: Secondary | ICD-10-CM | POA: Diagnosis not present

## 2011-09-16 DIAGNOSIS — R05 Cough: Secondary | ICD-10-CM | POA: Diagnosis not present

## 2011-09-16 DIAGNOSIS — J309 Allergic rhinitis, unspecified: Secondary | ICD-10-CM | POA: Diagnosis not present

## 2011-09-16 DIAGNOSIS — R059 Cough, unspecified: Secondary | ICD-10-CM | POA: Diagnosis not present

## 2011-09-20 DIAGNOSIS — M1710 Unilateral primary osteoarthritis, unspecified knee: Secondary | ICD-10-CM | POA: Diagnosis not present

## 2011-09-20 DIAGNOSIS — IMO0002 Reserved for concepts with insufficient information to code with codable children: Secondary | ICD-10-CM | POA: Diagnosis not present

## 2011-10-03 ENCOUNTER — Ambulatory Visit (HOSPITAL_COMMUNITY)
Admission: RE | Admit: 2011-10-03 | Discharge: 2011-10-03 | Disposition: A | Discharge status: Home / Self Care | Payer: Medicare Other | Source: Ambulatory Visit | Attending: Physician Assistant | Admitting: Physician Assistant

## 2011-10-03 ENCOUNTER — Encounter (HOSPITAL_COMMUNITY): Payer: Self-pay

## 2011-10-03 ENCOUNTER — Other Ambulatory Visit (HOSPITAL_BASED_OUTPATIENT_CLINIC_OR_DEPARTMENT_OTHER): Payer: Medicare Other | Admitting: Lab

## 2011-10-03 DIAGNOSIS — J984 Other disorders of lung: Secondary | ICD-10-CM | POA: Diagnosis not present

## 2011-10-03 DIAGNOSIS — C50319 Malignant neoplasm of lower-inner quadrant of unspecified female breast: Secondary | ICD-10-CM | POA: Diagnosis not present

## 2011-10-03 DIAGNOSIS — Z452 Encounter for adjustment and management of vascular access device: Secondary | ICD-10-CM | POA: Diagnosis not present

## 2011-10-03 DIAGNOSIS — Z853 Personal history of malignant neoplasm of breast: Secondary | ICD-10-CM

## 2011-10-03 DIAGNOSIS — Z171 Estrogen receptor negative status [ER-]: Secondary | ICD-10-CM | POA: Diagnosis not present

## 2011-10-03 DIAGNOSIS — C50919 Malignant neoplasm of unspecified site of unspecified female breast: Secondary | ICD-10-CM | POA: Diagnosis not present

## 2011-10-03 LAB — CBC WITH DIFFERENTIAL/PLATELET
Basophils Absolute: 0 10*3/uL (ref 0.0–0.1)
EOS%: 10.5 % — ABNORMAL HIGH (ref 0.0–7.0)
Eosinophils Absolute: 0.7 10*3/uL — ABNORMAL HIGH (ref 0.0–0.5)
HCT: 31.7 % — ABNORMAL LOW (ref 34.8–46.6)
HGB: 10.9 g/dL — ABNORMAL LOW (ref 11.6–15.9)
LYMPH%: 21.7 % (ref 14.0–49.7)
MCH: 30.5 pg (ref 25.1–34.0)
MCV: 89.1 fL (ref 79.5–101.0)
MONO%: 6.8 % (ref 0.0–14.0)
NEUT#: 3.8 10*3/uL (ref 1.5–6.5)
NEUT%: 60.4 % (ref 38.4–76.8)
Platelets: 180 10*3/uL (ref 145–400)
RDW: 13.9 % (ref 11.2–14.5)

## 2011-10-03 LAB — COMPREHENSIVE METABOLIC PANEL (CC13)
Alkaline Phosphatase: 90 U/L (ref 40–150)
CO2: 25 mEq/L (ref 22–29)
Creatinine: 0.9 mg/dL (ref 0.6–1.1)
Glucose: 109 mg/dl — ABNORMAL HIGH (ref 70–99)
Sodium: 140 mEq/L (ref 136–145)
Total Bilirubin: 0.4 mg/dL (ref 0.20–1.20)
Total Protein: 6.2 g/dL — ABNORMAL LOW (ref 6.4–8.3)

## 2011-10-03 LAB — CANCER ANTIGEN 27.29: CA 27.29: 19 U/mL (ref 0–39)

## 2011-10-03 MED ORDER — IOHEXOL 300 MG/ML  SOLN
80.0000 mL | Freq: Once | INTRAMUSCULAR | Status: AC | PRN
  Administered 2011-10-03: 80 mL via INTRAVENOUS

## 2011-10-17 ENCOUNTER — Telehealth: Payer: Self-pay | Admitting: *Deleted

## 2011-10-17 ENCOUNTER — Ambulatory Visit (HOSPITAL_BASED_OUTPATIENT_CLINIC_OR_DEPARTMENT_OTHER): Payer: Medicare Other | Admitting: Oncology

## 2011-10-17 VITALS — BP 101/67 | HR 92 | Temp 98.3°F | Resp 20 | Ht 63.0 in | Wt 210.1 lb

## 2011-10-17 DIAGNOSIS — C50319 Malignant neoplasm of lower-inner quadrant of unspecified female breast: Secondary | ICD-10-CM | POA: Diagnosis not present

## 2011-10-17 DIAGNOSIS — Z85820 Personal history of malignant melanoma of skin: Secondary | ICD-10-CM

## 2011-10-17 DIAGNOSIS — Z171 Estrogen receptor negative status [ER-]: Secondary | ICD-10-CM

## 2011-10-17 DIAGNOSIS — Z853 Personal history of malignant neoplasm of breast: Secondary | ICD-10-CM

## 2011-10-17 NOTE — Progress Notes (Signed)
ID: [id number] SORTER   DOB: 05/19/1945  MR#: [id number]  AOZ#:[id number]  HISTORY OF PRESENT ILLNESS: Shannon [name] had a negative screening mammogram in July of 2010.  About a month ago though she felt a change in the lower aspect of the right breast and brought this to Dr. Tenna Child attention.  He set her up for a diagnostic mammography on April 7 and Dr. Renato Gails found an interval mass with ill defined margins, deep in the lower, inner right breast.  This was palpable at the 3:30 position, approximately 9 cm from the nipple.  There were no palpable right axillary lymph nodes and by ultrasound there were no abnormal lymph nodes in the right axilla.  The ultrasound did show a 1.7 cm, irregular hypoechoic mass, just beneath the skin.  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  The tumor cells were estrogen receptor and progesterone receptor negative, both at 0%.  The MIB-1 was elevated at 57% and the HER2/neu ratio by CISH was positive at 2.74. Her subsequent history is as detailed below  INTERVAL HISTORY: Shannon [name] returns today for follow up of her breast cancer. The only other interval history of significant is that her son, who has Down syndrome, is now also becoming more demented (she mentioned this at the last visit as well). She will be consulting with a [facility] specialist on just this particular problem in the near future.  REVIEW OF SYSTEMS: She is planning to have right knee replacement under Ollen Gross, but will have to wait about 3 months. In the meantime she really can't exercise very well. She still having some headaches, which she now attributes to stress, but less frequently than before. Her feet swell a little bit at night. She is short of breath when walking up stairs but she gets a low weight of the top. She continues to have problems with interstitial cystitis, which  Eudelia Bunch helps her with. She has some back and joint pain which has not increased in intensity or frequency. She feels anxious particularly regarding the future regarding her disabled son. Otherwise a detailed review of systems today was noncontributory.  PAST MEDICAL HISTORY: Past Medical History  Diagnosis Date  . CARDIOMYOPATHY, SECONDARY 03/08/2010  . CHEST PAIN UNSPECIFIED 03/08/2010  . DYSPNEA ON EXERTION 02/10/2009  . GERD 08/11/2008  . HYPERLIPIDEMIA 08/11/2008  . HYPERTENSION 08/11/2008  . LIPOMA 02/10/2009  . OSTEOPENIA 07/2007  . PREDIABETES 06/15/2009  . 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 Carpal tunnel syndrome of left wrist   . Neuropathy   . Craniopharyngioma 2000  . Interstitial cystitis   . Brain tumor   . DEPRESSION 08/11/2008  . Neuropathy   . IC (interstitial cystitis)   . 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 Arthritis   . Osteoporosis   . Hyponatremia 08/28/2011  . Anemia 08/28/2011  . History of chicken pox   . H/O measles   . H/O mumps   . Asthma     childhood  . Dermatitis   . OA (osteoarthritis) 09/07/2011  . Breast cancer     right  . Melanoma 2006    neck  . Breast CA   . Melanoma 2008    DR. Yetta Barre  Significant for history of melanoma removed from the  patient's anterior chest about five years ago by Dr. Nita Sells.  We will try to get that report.  She also had a craniopharyngioma removed by Dr. Shirlean Kelly about 11 years ago.  She required no radiation for this (she presented with headaches and amaurosis of the right eye).  She has just undergone carpal tunnel release on the left by Dr. Josephine Igo and is recovering well from that.  She had a lipoma removed from her right thigh by Dr. Jamey Ripa.  She has a history of cholecystectomy, a history of prior breast cyst removal, a history of interstitial cystitis, followed by Dr. Logan Bores, history of hypertension, history of  hypercholesterolemia, history of mild GERD/HH, history of childhood asthma, resolved, history of osteoarthritis involving both knees followed by Dr. Ollen Gross and history of osteopenias noted on bone density obtained 2009.  PAST SURGICAL HISTORY: Past Surgical History  Procedure Date  . Cholecystectomy   . Carpal tunnel release     L wrist, ulnar nerve moved  . Elbow surgery     left  . Tubal ligation 1997  . Lipoma excision 03/28/2009    right leg  . Portacath placement may 2011  . Craniotomy for tumor 2000  . Tonsillectomy 1958  . Melanoma excision   . Porta cath   . Port-a-cath removal 11/30/2010    [place]reck  . Breast lumpectomy 04/2009    RIGHT FOR BREAST CANCER-CHEMO/RADIATION X 1 YEAR    FAMILY HISTORY Family History  Problem Relation Age of Onset  . Breast cancer Mother   . Lung cancer Mother   . Cancer Mother     sarcoma, breast  . Hypertension Mother   . Colon cancer Neg Hx   . Prostate cancer Father   . Heart disease Father     chf  . Cancer Father     prostate  . Prostate cancer Brother   . Cancer Brother     PROSTATE  . Stomach cancer Maternal Aunt   . Uterine cancer Maternal Aunt   . Down syndrome Son   . Heart disease Maternal Grandfather     MI  The patient's father is alive at age 3.  He lives in a residential home, but the patient consider herself as his primary caregiver.  The patient's mother had breast cancer diagnosed at the age of 12.  She developed lower extremity sarcoma 10 years later, which eventually took her life.  The patient has one brother and one sister both in good health.  GYNECOLOGIC HISTORY: She is P3, first pregnancy to term age 33.  Change of life around age 20.  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  Her husband Rayna Sexton is an English as a second language teacher.  Daughter Christieis a  Engineer, civil (consulting) at [employer] in [place].  Son Nida Boatman  works as an Radio broadcast assistant, actually not with his dad.  The third child is [name], lives at home, has Down's syndrome.  The patient attends [employer].   ADVANCED DIRECTIVES:  HEALTH MAINTENANCE: History  Substance Use Topics  . Smoking status: Never Smoker   . Smokeless tobacco: Never Used  . Alcohol Use: No     Colonoscopy:  PAP:  Bone density:  Lipid panel:  No Known Allergies  Current Outpatient Prescriptions  Medication Sig Dispense Refill  . aspirin 81 MG tablet  Take 81 mg by mouth daily.        Marland Kitchen b complex vitamins tablet Take 1 tablet by mouth daily.        . bifidobacterium infantis (ALIGN) capsule Take 1 capsule by mouth daily.      . carvedilol (COREG) 6.25 MG tablet TAKE 1 TABLET (6.25 MG TOTAL) BY MOUTH 2 (TWO) TIMES DAILY WITH A MEAL.  60 tablet  6  . cholecalciferol (VITAMIN D) 400 UNITS TABS Take 2,000 Units by mouth daily.      . hydrochlorothiazide (HYDRODIURIL) 25 MG tablet Take 1 tablet (25 mg total) by mouth daily.  30 tablet  1  . hydrOXYzine (ATARAX/VISTARIL) 25 MG tablet Take 1 tablet (25 mg total) by mouth daily.  30 tablet  3  . LORazepam (ATIVAN) 0.5 MG tablet Take 1 tablet (0.5 mg total) by mouth at bedtime.  90 tablet  1  . Misc Natural Products (COSAMIN ASU ADVANCED FORMULA PO) Take 2 tablets by mouth daily.        . Multiple Vitamins-Minerals (CENTRUM SILVER PO) Take 1 tablet by mouth daily.       Marland Kitchen omeprazole (PRILOSEC) 40 MG capsule Take 1 capsule (40 mg total) by mouth daily.  30 capsule  3  . pentosan polysulfate (ELMIRON) 100 MG capsule Take 200 mg by mouth 2 (two) times daily.       . Risedronate Sodium (ATELVIA) 35 MG TBEC Take 1 tablet (35 mg total) by mouth once a week.  4 tablet  12  . rosuvastatin (CRESTOR) 20 MG tablet Take 20 mg by mouth daily.        Marland Kitchen telmisartan (MICARDIS) 80 MG tablet Take 1 tablet (80 mg total) by mouth daily.  30 tablet  3  .  telmisartan-hydrochlorothiazide (MICARDIS HCT) 40-12.5 MG per tablet Take 2 tablets by mouth daily.      Marland Kitchen venlafaxine XR (EFFEXOR-XR) 75 MG 24 hr capsule Take 1 capsule (75 mg total) by mouth daily.  90 capsule  12  . vitamin E 200 UNIT capsule Take 200 Units by mouth daily.      . Calcium Carbonate-Vitamin D (CALCIUM-VITAMIN D) 600-200 MG-UNIT CAPS Take 1 tablet by mouth 2 (two) times daily.        Marland Kitchen guaiFENesin (MUCINEX) 600 MG 12 hr tablet Take 1 tablet (600 mg total) by mouth 2 (two) times daily.  20 tablet  0  . sucralfate (CARAFATE) 1 G tablet Take 1 g by mouth as needed. indigestion        OBJECTIVE: Middle-aged white woman who appears tired Filed Vitals:   10/17/11 1107  BP: 101/67  Pulse: 92  Temp: 98.3 F (36.8 C)  Resp: 20     Body mass index is 37.22 kg/(m^2).    ECOG FS: 1  Sclerae unicteric Oropharynx clear No cervical or supraclavicular adenopathy Lungs no rales or rhonchi Heart regular rate and rhythm Abd benign MSK no focal spinal tenderness, no peripheral edema Neuro: nonfocal Breasts: The right breast is status post lumpectomy, with no evidence of local recurrence. Left breast is unremarkable.  LAB RESULTS: Lab Results  Component Value Date   WBC 6.3 10/03/2011   NEUTROABS 3.8 10/03/2011   HGB 10.9* 10/03/2011   HCT 31.7* 10/03/2011   MCV 89.1 10/03/2011   PLT 180 10/03/2011      Chemistry      Component Value Date/Time   NA 140 10/03/2011 0954   NA 138 08/28/2011 1110   NA 144 10/20/2010  0854   K 4.2 10/03/2011 0954   K 4.6 08/28/2011 1110   K 4.2 10/20/2010 0854   CL 106 10/03/2011 0954   CL 101 08/28/2011 1110   CL 108 10/20/2010 0854   CO2 25 10/03/2011 0954   CO2 29 08/28/2011 1110   CO2 26 10/20/2010 0854   BUN 16.0 10/03/2011 0954   BUN 11 08/28/2011 1110   BUN 13 10/20/2010 0854   CREATININE 0.9 10/03/2011 0954   CREATININE 1.0 08/28/2011 1110   CREATININE 0.9 10/20/2010 0854      Component Value Date/Time   CALCIUM 9.6 08/28/2011 1110   CALCIUM 9.3 03/08/2011 1447    CALCIUM 8.8 10/20/2010 0854   ALKPHOS 90 10/03/2011 0954   ALKPHOS 75 08/28/2011 1110   ALKPHOS 106* 10/20/2010 0854   AST 20 10/03/2011 0954   AST 36 08/28/2011 1110   AST 32 10/20/2010 0854   ALT 24 10/03/2011 0954   ALT 32 08/28/2011 1110   BILITOT 0.40 10/03/2011 0954   BILITOT 0.5 08/28/2011 1110   BILITOT 0.50 10/20/2010 0854       Lab Results  Component Value Date   LABCA2 19 10/03/2011    No components found with this basename: [id number]    No results found for this basename: INR:1;PROTIME:1 in the last 168 hours  Urinalysis    Component Value Date/Time   COLORURINE LT. YELLOW 06/15/2010 1048   APPEARANCEUR CLEAR 06/15/2010 1048   LABSPEC <=1.005 06/15/2010 1048   PHURINE 6.0 06/15/2010 1048   GLUCOSEU NEGATIVE 10/05/2009 0925   HGBUR NEGATIVE 06/15/2010 1048   BILIRUBINUR NEGATIVE 06/15/2010 1048   KETONESUR NEGATIVE 06/15/2010 1048   PROTEINUR NEGATIVE 10/05/2009 0925   UROBILINOGEN 0.2 06/15/2010 1048   NITRITE NEGATIVE 06/15/2010 1048   LEUKOCYTESUR NEGATIVE 06/15/2010 1048    STUDIES: Mm Digital Diagnostic Bilat  08/08/2011  *RADIOLOGY REPORT*  Clinical Data:  The patient underwent right lumpectomy, chemotherapy and radiation therapy for breast cancer in 2011.  DIGITAL DIAGNOSTIC BILATERAL MAMMOGRAM WITH CAD  Comparison:  08/04/2010, 05/06/2009  Findings:  The breast tissue is almost entirely fatty.  Lumpectomy changes are noted in the right lower inner quadrant posteriorly. There is a coarse benign dystrophic calcification in the lumpectomy site.  There is no suspicious dominant mass, nonsurgical architectural distortion or calcification to suggest malignancy. Mammographic images were processed with CAD.  IMPRESSION: No mammographic evidence of malignancy.  RECOMMENDATION: Yearly diagnostic mammography is suggested.  BI-RADS CATEGORY 2:  Benign finding(s).  Original Report Authenticated By: Daryl Eastern, M.D.   Ct Chest W Contrast  10/03/2011  *RADIOLOGY REPORT*  Clinical Data:  Restaging breast cancer.  CT CHEST WITH CONTRAST  Technique:  Multidetector CT imaging of the chest was performed following the standard protocol during bolus administration of intravenous contrast.  Contrast: 80mL OMNIPAQUE IOHEXOL 300 MG/ML  SOLN  Comparison: Nuclear medicine PET CT 05/24/2009 nuclear medicine PET CT 05/24/2009 and report from bilateral mammogram 08/08/2011  Findings: There is postoperative scarring in the deep aspect of the lower inner quadrant of the right breast, at the site of prior lumpectomy.  Heart size is within normal limits.  Thoracic aorta normal in caliber and enhancement, with a few scattered areas of atherosclerotic calcification.  Thyroid gland within normal limits.  Negative for supraclavicular, internal mammary chain, mediastinal, hilar, or axillary lymphadenopathy.  Negative for pleural or pericardial effusion.  Linear scarring in the right middle lobe is stable compared to chest CT of 2011. Mild subpleural reticulation in the right  middle lobe may reflect post-radiation change.  Subpleural scarring versus intramammary lymph node in the right lower lobe on image number 26 is stable compared to prior CT of 2011.  No new or enlarging pulmonary nodules.  The trachea and mainstem bronchi are clear.  The imaged portions of the liver, pancreas, and spleen are within normal limits.  The adrenal glands are normal.  The multilevel degenerative changes of the mid to lower thoracic spine.  No acute bony abnormality, or evidence of bony metastatic disease.  IMPRESSION: No evidence of metastatic disease to the thorax.  Postoperative changes of the lower inner quadrant of the right breast.   Original Report Authenticated By: Britta Mccreedy, M.D.     ASSESSMENT: 66 y.o. [facility] woman status post right breast biopsy April 2011 for a clinical T2 N0 grade 3 invasive ductal carcinoma which was estrogen and progesterone receptor negative, HER-2/neu positive with an MIB-1 of 58%  (1) treated  neoadjuvantly with multiple agents (due to tolerance issues):  Namely 2 cycles of standard dose paclitaxel/ carboplatin/ trastuzumab followed by 3 cycles of paclitaxel/ trastuzumab given weekly, the last of this given with weekly carboplatin, followed by 2 cycles of dose dense doxorubicin and cyclophosphamide with the doxorubicin given by continuous infusion all of this completed in August 2011  (2) status post definitive right lumpectomy and sentinel lymph node sampling September 2011 showing a complete pathologic response both in the breast and sampled lymph nodes.    (3) completed radiation therapy November 2011.    (4) continued to receive trastuzumab until January 2012 at which time it was discontinued secondary to a drop in her ejection fraction which has now completely resolved.   PLAN: There is no evidence for disease recurrence now 2 years out from her definitive surgery. In fact, because she had a complete pathologic response, her prognosis is quite good. I tried to reassure her regarding this as best I could, since she is so concerned regarding the next few years as her son continues to decline.  Otherwise I making no changes in her of followup. She will see [place] again in 6 months. She will have lab work a week before that visit. She knows to call for any problems that may develop before the next visit.  MAGRINAT,GUSTAV C    10/17/2011

## 2011-10-17 NOTE — Telephone Encounter (Signed)
Gave patient appointment for 04-08-2012 labs   Gave patient appointment for 04-15-2012 md

## 2011-10-19 ENCOUNTER — Telehealth: Payer: Self-pay | Admitting: Family Medicine

## 2011-10-19 ENCOUNTER — Other Ambulatory Visit: Payer: Medicare Other

## 2011-10-19 NOTE — Telephone Encounter (Signed)
Patient just had labs done 10/03/11 at the cancer center, does she still need to come in for her OV on 10/23/11?

## 2011-10-19 NOTE — Telephone Encounter (Signed)
So if she is feeling well and her edema is down, she could put off her appt here 1-2 months and we can recheck sugar numbers and cbc at that visit

## 2011-10-20 NOTE — Telephone Encounter (Signed)
Left a detailed message and stated we would keep her on the schedule until we heard back from her.

## 2011-10-21 NOTE — Telephone Encounter (Signed)
Patient called back & cancelled 10/23/11 appt but kept her future appts.

## 2011-10-23 ENCOUNTER — Ambulatory Visit: Payer: Medicare Other | Admitting: Family Medicine

## 2011-10-25 DIAGNOSIS — N3010 Interstitial cystitis (chronic) without hematuria: Secondary | ICD-10-CM | POA: Diagnosis not present

## 2011-10-25 DIAGNOSIS — R350 Frequency of micturition: Secondary | ICD-10-CM | POA: Diagnosis not present

## 2011-10-25 DIAGNOSIS — R3915 Urgency of urination: Secondary | ICD-10-CM | POA: Diagnosis not present

## 2011-10-25 DIAGNOSIS — R109 Unspecified abdominal pain: Secondary | ICD-10-CM | POA: Diagnosis not present

## 2011-10-26 ENCOUNTER — Ambulatory Visit: Payer: Medicare Other | Admitting: Oncology

## 2011-10-31 ENCOUNTER — Ambulatory Visit: Payer: Medicare Other | Admitting: Oncology

## 2011-11-01 DIAGNOSIS — N3010 Interstitial cystitis (chronic) without hematuria: Secondary | ICD-10-CM | POA: Diagnosis not present

## 2011-11-14 ENCOUNTER — Ambulatory Visit (INDEPENDENT_AMBULATORY_CARE_PROVIDER_SITE_OTHER): Payer: Medicare Other

## 2011-11-14 DIAGNOSIS — Z23 Encounter for immunization: Secondary | ICD-10-CM

## 2011-11-14 DIAGNOSIS — M1710 Unilateral primary osteoarthritis, unspecified knee: Secondary | ICD-10-CM | POA: Diagnosis not present

## 2011-11-14 DIAGNOSIS — IMO0002 Reserved for concepts with insufficient information to code with codable children: Secondary | ICD-10-CM | POA: Diagnosis not present

## 2011-11-22 DIAGNOSIS — N3010 Interstitial cystitis (chronic) without hematuria: Secondary | ICD-10-CM | POA: Diagnosis not present

## 2011-11-29 DIAGNOSIS — Z85820 Personal history of malignant melanoma of skin: Secondary | ICD-10-CM | POA: Diagnosis not present

## 2011-11-29 DIAGNOSIS — N3010 Interstitial cystitis (chronic) without hematuria: Secondary | ICD-10-CM | POA: Diagnosis not present

## 2011-11-29 DIAGNOSIS — L565 Disseminated superficial actinic porokeratosis (DSAP): Secondary | ICD-10-CM | POA: Diagnosis not present

## 2011-12-05 ENCOUNTER — Other Ambulatory Visit (INDEPENDENT_AMBULATORY_CARE_PROVIDER_SITE_OTHER): Payer: Medicare Other

## 2011-12-05 DIAGNOSIS — I10 Essential (primary) hypertension: Secondary | ICD-10-CM | POA: Diagnosis not present

## 2011-12-05 DIAGNOSIS — R7309 Other abnormal glucose: Secondary | ICD-10-CM | POA: Diagnosis not present

## 2011-12-05 LAB — HEPATIC FUNCTION PANEL
Albumin: 3.8 g/dL (ref 3.5–5.2)
Alkaline Phosphatase: 82 U/L (ref 39–117)
Bilirubin, Direct: 0.1 mg/dL (ref 0.0–0.3)
Total Protein: 6.1 g/dL (ref 6.0–8.3)

## 2011-12-05 LAB — LIPID PANEL
Cholesterol: 185 mg/dL (ref 0–200)
HDL: 44.5 mg/dL (ref >39.00)
Total CHOL/HDL Ratio: 4
Triglycerides: 235 mg/dL — ABNORMAL HIGH (ref 0.0–149.0)
VLDL: 47 mg/dL — ABNORMAL HIGH (ref 0.0–40.0)

## 2011-12-05 LAB — RENAL FUNCTION PANEL
Albumin: 3.8 g/dL (ref 3.5–5.2)
BUN: 22 mg/dL (ref 6–23)
CO2: 30 meq/L (ref 19–32)
Calcium: 8.7 mg/dL (ref 8.4–10.5)
Chloride: 101 meq/L (ref 96–112)
Creatinine, Ser: 1.1 mg/dL (ref 0.4–1.2)
GFR: 54.87 mL/min — ABNORMAL LOW (ref >60.00)
Glucose, Bld: 88 mg/dL (ref 70–99)
Phosphorus: 3.4 mg/dL (ref 2.3–4.6)
Potassium: 4.1 meq/L (ref 3.5–5.1)
Sodium: 137 meq/L (ref 135–145)

## 2011-12-05 LAB — CBC
Hemoglobin: 10.8 g/dL — ABNORMAL LOW (ref 12.0–15.0)
MCV: 90.6 fl (ref 78.0–100.0)
Platelets: 238 10*3/uL (ref 150.0–400.0)
RBC: 3.67 Mil/uL — ABNORMAL LOW (ref 3.87–5.11)

## 2011-12-05 LAB — HEMOGLOBIN A1C: Hgb A1c MFr Bld: 5.9 % (ref 4.6–6.5)

## 2011-12-05 LAB — LDL CHOLESTEROL, DIRECT: Direct LDL: 103.2 mg/dL

## 2011-12-11 ENCOUNTER — Other Ambulatory Visit: Payer: Self-pay | Admitting: *Deleted

## 2011-12-11 MED ORDER — CARVEDILOL 6.25 MG PO TABS: 6.2500 mg | ORAL_TABLET | Freq: Two times a day (BID) | ORAL | Status: DC

## 2011-12-12 ENCOUNTER — Encounter: Payer: Self-pay | Admitting: Family Medicine

## 2011-12-12 ENCOUNTER — Ambulatory Visit (INDEPENDENT_AMBULATORY_CARE_PROVIDER_SITE_OTHER): Payer: Medicare Other | Admitting: Family Medicine

## 2011-12-12 VITALS — BP 108/75 | HR 88 | Temp 98.0°F | Ht 63.0 in | Wt 208.0 lb

## 2011-12-12 DIAGNOSIS — I10 Essential (primary) hypertension: Secondary | ICD-10-CM | POA: Diagnosis not present

## 2011-12-12 DIAGNOSIS — D649 Anemia, unspecified: Secondary | ICD-10-CM

## 2011-12-12 DIAGNOSIS — R0602 Shortness of breath: Secondary | ICD-10-CM

## 2011-12-12 DIAGNOSIS — E785 Hyperlipidemia, unspecified: Secondary | ICD-10-CM | POA: Diagnosis not present

## 2011-12-12 DIAGNOSIS — F3289 Other specified depressive episodes: Secondary | ICD-10-CM

## 2011-12-12 DIAGNOSIS — F329 Major depressive disorder, single episode, unspecified: Secondary | ICD-10-CM

## 2011-12-12 DIAGNOSIS — R0989 Other specified symptoms and signs involving the circulatory and respiratory systems: Secondary | ICD-10-CM

## 2011-12-12 DIAGNOSIS — M1990 Unspecified osteoarthritis, unspecified site: Secondary | ICD-10-CM

## 2011-12-12 DIAGNOSIS — R0609 Other forms of dyspnea: Secondary | ICD-10-CM

## 2011-12-12 MED ORDER — PSYLLIUM 28 % PO PACK: 1.0000 | PACK | Freq: Every day | ORAL | Status: DC

## 2011-12-12 MED ORDER — FERROUS FUMARATE-FOLIC ACID 324-1 MG PO TABS: ORAL_TABLET | ORAL | Status: DC

## 2011-12-12 MED ORDER — KRILL OIL PO CAPS: ORAL_CAPSULE | ORAL | Status: DC

## 2011-12-12 NOTE — Assessment & Plan Note (Signed)
Start Hemocyte F daily and repeat CBC with next visit.

## 2011-12-12 NOTE — Patient Instructions (Addendum)

## 2011-12-12 NOTE — Assessment & Plan Note (Signed)
She notes a recent worsening and is preparing for TKR will have her evaluated by cardiology for consideration and cardiac clearance

## 2011-12-12 NOTE — Assessment & Plan Note (Signed)
Given samples of Crestor 20 mg daily, will not increase for now will add Metamucil daily and avoid trans fats

## 2011-12-12 NOTE — Assessment & Plan Note (Signed)
Has surgery scheduled for 02/05/12 for R TKR

## 2011-12-12 NOTE — Assessment & Plan Note (Signed)
Acknowledges being more stressed recently with son's health, etc, but does not want to increase Effexor at this time

## 2011-12-12 NOTE — Progress Notes (Signed)
Patient ID: Shannon [name], female   DOB: 1945/11/30, 66 y.o.   MRN: [id number] QUIANNA AVERY [id number] 04/07/1945 12/12/2011      Progress Note-Follow Up  Subjective  Chief Complaint  Chief Complaint  Patient presents with  . Follow-up    3 month follow up    HPI  This is a 66 year old Caucasian female who is in today for followup. She is preparing to have knee surgery in January 2014 and continues to struggle with significant right knee pain. She's also noting a recent worsening of dyspnea on exertion. Stairs are particularly tiring. She denies chest pain or palpitations. She's not had any acute recent illness. Denies fevers or chills. No increasing congestion or malaise. No GI or GU complaints. She does note she is under a great deal of stress secondary to her son's acute illness. Does not feel her depression is significantly heavy and does not want to increase her medications in this regard at present.  Past Medical History  Diagnosis Date  . CARDIOMYOPATHY, SECONDARY 03/08/2010  . CHEST PAIN UNSPECIFIED 03/08/2010  . DYSPNEA ON EXERTION 02/10/2009  . GERD 08/11/2008  . HYPERLIPIDEMIA 08/11/2008  . HYPERTENSION 08/11/2008  . LIPOMA 02/10/2009  . OSTEOPENIA 07/2007  . PREDIABETES 06/15/2009  . 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 Carpal tunnel syndrome of left wrist   . Neuropathy   . Craniopharyngioma(M9350/1) 2000  . Interstitial cystitis   . Brain tumor   . DEPRESSION 08/11/2008  . Neuropathy   . IC (interstitial cystitis)   . 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 Arthritis   . Osteoporosis   . Hyponatremia 08/28/2011  . Anemia 08/28/2011  . History of chicken pox   . H/O measles   . H/O mumps   . Asthma     childhood  . Dermatitis   . OA (osteoarthritis) 09/07/2011  . Breast cancer     right  . Melanoma 2006    neck  . Breast CA   . Melanoma 2008    DR. Yetta Barre    Past Surgical History    Procedure Date  . Cholecystectomy   . Carpal tunnel release     L wrist, ulnar nerve moved  . Elbow surgery     left  . Tubal ligation 1997  . Lipoma excision 03/28/2009    right leg  . Portacath placement may 2011  . Craniotomy for tumor 2000  . Tonsillectomy 1958  . Melanoma excision   . Porta cath   . Port-a-cath removal 11/30/2010    Streck  . Breast lumpectomy 04/2009    RIGHT FOR BREAST CANCER-CHEMO/RADIATION X 1 YEAR    Family History  Problem Relation Age of Onset  . Breast cancer Mother   . Lung cancer Mother   . Cancer Mother     sarcoma, breast  . Hypertension Mother   . Colon cancer Neg Hx   . Prostate cancer Father   . Heart disease Father     chf  . Cancer Father     prostate  . Prostate cancer Brother   . Cancer Brother     PROSTATE  . Stomach cancer Maternal Aunt   . Uterine cancer Maternal Aunt   . Down syndrome Son   . Heart disease Maternal Grandfather     MI    History   Social History  .  Marital Status: Married    Spouse Name: N/A    Number of Children: 3  . Years of Education: N/A   Occupational History  . boutique owner    Social History Main Topics  . Smoking status: Never Smoker   . Smokeless tobacco: Never Used  . Alcohol Use: No  . Drug Use: No  . Sexually Active: No   Other Topics Concern  . Not on file   Social History Narrative  . No narrative on file    Current Outpatient Prescriptions on File Prior to Visit  Medication Sig Dispense Refill  . aspirin 81 MG tablet Take 81 mg by mouth daily.        Marland Kitchen b complex vitamins tablet Take 1 tablet by mouth daily.        . bifidobacterium infantis (ALIGN) capsule Take 1 capsule by mouth daily.      . carvedilol (COREG) 6.25 MG tablet Take 1 tablet (6.25 mg total) by mouth 2 (two) times daily with a meal.  60 tablet  3  . cholecalciferol (VITAMIN D) 400 UNITS TABS Take 2,000 Units by mouth daily.      . hydrochlorothiazide (HYDRODIURIL) 25 MG tablet Take 1 tablet (25 mg  total) by mouth daily.  30 tablet  1  . hydrOXYzine (ATARAX/VISTARIL) 25 MG tablet Take 1 tablet (25 mg total) by mouth daily.  30 tablet  3  . LORazepam (ATIVAN) 0.5 MG tablet Take 1 tablet (0.5 mg total) by mouth at bedtime.  90 tablet  1  . Multiple Vitamins-Minerals (CENTRUM SILVER PO) Take 1 tablet by mouth daily.       Marland Kitchen omeprazole (PRILOSEC) 40 MG capsule Take 1 capsule (40 mg total) by mouth daily.  30 capsule  3  . pentosan polysulfate (ELMIRON) 100 MG capsule Take 200 mg by mouth 2 (two) times daily.       . Risedronate Sodium (ATELVIA) 35 MG TBEC Take 1 tablet (35 mg total) by mouth once a week.  4 tablet  12  . rosuvastatin (CRESTOR) 20 MG tablet Take 20 mg by mouth daily.        Marland Kitchen telmisartan (MICARDIS) 80 MG tablet Take 1 tablet (80 mg total) by mouth daily.  30 tablet  3  . venlafaxine XR (EFFEXOR-XR) 75 MG 24 hr capsule Take 1 capsule (75 mg total) by mouth daily.  90 capsule  12  . gabapentin (NEURONTIN) 300 MG capsule       . guaiFENesin (MUCINEX) 600 MG 12 hr tablet Take 1 tablet (600 mg total) by mouth 2 (two) times daily.  20 tablet  0  . sucralfate (CARAFATE) 1 G tablet Take 1 g by mouth as needed. indigestion        No Known Allergies  Review of Systems  Review of Systems  Constitutional: Positive for malaise/fatigue. Negative for fever.  HENT: Negative for congestion.   Eyes: Negative for discharge.  Respiratory: Positive for shortness of breath.   Cardiovascular: Negative for chest pain, palpitations and leg swelling.  Gastrointestinal: Negative for nausea, abdominal pain and diarrhea.  Genitourinary: Negative for dysuria.  Musculoskeletal: Negative for falls.  Skin: Negative for rash.  Neurological: Negative for loss of consciousness and headaches.  Endo/Heme/Allergies: Negative for polydipsia.  Psychiatric/Behavioral: Positive for depression. Negative for suicidal ideas. The patient is nervous/anxious. The patient does not have insomnia.      Objective  BP 108/75  Pulse 88  Temp 98 F (36.7 C) (Temporal)  Ht 5'  3" (1.6 m)  Wt 208 lb (94.348 kg)  BMI 36.85 kg/m2  SpO2 94%  LMP 01/30/1994  Physical Exam  Physical Exam  Constitutional: She is oriented to person, place, and time and well-developed, well-nourished, and in no distress. No distress.  HENT:  Head: Normocephalic and atraumatic.  Eyes: Conjunctivae normal are normal.  Neck: Neck supple. No thyromegaly present.  Cardiovascular: Normal rate, regular rhythm and normal heart sounds.   No murmur heard. Pulmonary/Chest: Effort normal and breath sounds normal. She has no wheezes.  Abdominal: She exhibits no distension and no mass.  Musculoskeletal: She exhibits no edema.  Lymphadenopathy:    She has no cervical adenopathy.  Neurological: She is alert and oriented to person, place, and time.  Skin: Skin is warm and dry. No rash noted. She is not diaphoretic.  Psychiatric: Memory, affect and judgment normal.    Lab Results  Component Value Date   TSH 1.96 12/05/2011   Lab Results  Component Value Date   WBC 6.9 12/05/2011   HGB 10.8* 12/05/2011   HCT 33.2* 12/05/2011   MCV 90.6 12/05/2011   PLT 238.0 12/05/2011   Lab Results  Component Value Date   CREATININE 1.1 12/05/2011   BUN 22 12/05/2011   NA 137 12/05/2011   K 4.1 12/05/2011   CL 101 12/05/2011   CO2 30 12/05/2011   Lab Results  Component Value Date   ALT 26 12/05/2011   AST 23 12/05/2011   ALKPHOS 82 12/05/2011   BILITOT 0.4 12/05/2011   Lab Results  Component Value Date   CHOL 185 12/05/2011   Lab Results  Component Value Date   HDL 44.50 12/05/2011   Lab Results  Component Value Date   LDLCALC 62 06/09/2009   Lab Results  Component Value Date   TRIG 235.0* 12/05/2011   Lab Results  Component Value Date   CHOLHDL 4 12/05/2011     Assessment & Plan HYPERTENSION Well controlled at current meds, no changes  HYPERLIPIDEMIA Given samples of Crestor 20 mg daily, will not increase  for now will add Metamucil daily and avoid trans fats  OA (osteoarthritis) Has surgery scheduled for 02/05/12 for R TKR  DYSPNEA ON EXERTION She notes a recent worsening and is preparing for TKR will have her evaluated by cardiology for consideration and cardiac clearance  DEPRESSION Acknowledges being more stressed recently with son's health, etc, but does not want to increase Effexor at this time  Anemia Start Hemocyte F daily and repeat CBC with next visit.

## 2011-12-12 NOTE — Assessment & Plan Note (Signed)
Well controlled at current meds, no changes

## 2011-12-14 ENCOUNTER — Ambulatory Visit: Payer: Medicare Other | Admitting: Physician Assistant

## 2011-12-14 ENCOUNTER — Other Ambulatory Visit: Payer: Self-pay | Admitting: Orthopedic Surgery

## 2011-12-14 MED ORDER — DEXAMETHASONE SODIUM PHOSPHATE 10 MG/ML IJ SOLN: 10.0000 mg | Freq: Once | INTRAMUSCULAR | Status: DC

## 2011-12-14 MED ORDER — BUPIVACAINE 0.25 % ON-Q PUMP SINGLE CATH 300ML: 300.0000 mL | INJECTION | Status: DC

## 2011-12-14 NOTE — Progress Notes (Signed)
Preoperative surgical orders have been place into the Epic hospital system for Shannon [name] on 12/14/2011, 11:46 AM  by Patrica Duel for surgery on 02/05/12.  Preop Total Knee orders including Bupivacaine On-Q pump, IV Tylenol, and IV Decadron as long as there are no contraindications to the above medications. Avel Peace, PA-C

## 2011-12-18 ENCOUNTER — Encounter: Payer: Self-pay | Admitting: Physician Assistant

## 2011-12-18 ENCOUNTER — Telehealth: Payer: Self-pay | Admitting: *Deleted

## 2011-12-18 ENCOUNTER — Ambulatory Visit (INDEPENDENT_AMBULATORY_CARE_PROVIDER_SITE_OTHER): Payer: Medicare Other | Admitting: Physician Assistant

## 2011-12-18 VITALS — BP 124/80 | HR 83 | Resp 18 | Ht 64.0 in | Wt 210.0 lb

## 2011-12-18 DIAGNOSIS — I429 Cardiomyopathy, unspecified: Secondary | ICD-10-CM | POA: Diagnosis not present

## 2011-12-18 DIAGNOSIS — R0602 Shortness of breath: Secondary | ICD-10-CM | POA: Diagnosis not present

## 2011-12-18 DIAGNOSIS — E785 Hyperlipidemia, unspecified: Secondary | ICD-10-CM

## 2011-12-18 DIAGNOSIS — D649 Anemia, unspecified: Secondary | ICD-10-CM

## 2011-12-18 DIAGNOSIS — R079 Chest pain, unspecified: Secondary | ICD-10-CM | POA: Diagnosis not present

## 2011-12-18 DIAGNOSIS — I10 Essential (primary) hypertension: Secondary | ICD-10-CM | POA: Diagnosis not present

## 2011-12-18 DIAGNOSIS — Z853 Personal history of malignant neoplasm of breast: Secondary | ICD-10-CM

## 2011-12-18 LAB — BASIC METABOLIC PANEL
CO2: 29 mEq/L (ref 19–32)
Calcium: 9.2 mg/dL (ref 8.4–10.5)
Creatinine, Ser: 1 mg/dL (ref 0.4–1.2)
Glucose, Bld: 96 mg/dL (ref 70–99)

## 2011-12-18 MED ORDER — CARVEDILOL 6.25 MG PO TABS: 6.2500 mg | ORAL_TABLET | Freq: Two times a day (BID) | ORAL | Status: DC

## 2011-12-18 NOTE — Telephone Encounter (Signed)
lmom labs good, no changes to be made 

## 2011-12-18 NOTE — Progress Notes (Signed)
[address], [address] [place], [place]  [address] Phone: [phone], Fax:  [phone]  Date:  12/18/2011   Name:  Shannon [name]   DOB:  1945-03-07   MRN:  [id number]  PCP:  Danise Edge, MD  Primary Cardiologist:  Dr. Valera Castle  Primary Electrophysiologist:  None    History of Present Illness: Shannon [name] is a 66 y.o. female who returns for evaluation of dyspnea as well as surgical clearance.  She has a history of cardiomyopathy. She was initially evaluated by Dr. Daleen Squibb in 2/12. EF by echocardiogram in May 2011 was normal. She has been treated with chemotherapy for breast cancer. This included Herceptin. Followup echocardiogram demonstrated reduced ejection fraction of 45-50%. Nuclear study 2/12 was low risk with apical perfusion defect-question small area of ischemia versus shifting breast attenuation. No further ischemic evaluation was felt to be necessary. Patient was placed on medical therapy and followup echocardiogram demonstrated normalized LV function.  Last seen by Dr. Daleen Squibb 12/12.  Over the last 2-3 months, she has noted increased dyspnea with exertion. This is with more extreme activities. She does not typically have to stop to catch her breath during activities. She probably describes class II-IIb symptoms.  She denies orthopnea or PND. She denies significant pedal edema. She does note some tightness in her chest with exertion. She denies radiating symptoms, associated nausea or diaphoresis. She denies syncope. She has awoken in the middle of the night with some chest discomfort in the past. This is fairly stable over the last 6 mos.  She denies pleuritic chest pain. She denies chest pain with lying supine. She denies any recent travel or hospitalization. She has recently been placed back on iron for worsening anemia.  Labs (11/13):    K 4.1, creatinine 1.1, ALT 26, LDL 103, Hgb 10.8, TSH 1.96  Wt Readings from Last 3 Encounters:  12/18/11 210 lb (95.255 kg)    12/12/11 208 lb (94.348 kg)  10/17/11 210 lb 1.6 oz (95.301 kg)     Past Medical History  Diagnosis Date  . NICM (nonischemic cardiomyopathy) 03/08/2010    likely 2/2 chemotx - a. Echo 2012: EF 45-50%;  b. Lex MV 2/12:  low risk, apical defect (small area of ischemia vs shifting breast atten);  c.  Echo 7/12: Normal wall thickness, EF 60-65%, normal wall motion, grade 1 diastolic dysfunction, mild LAE, PASP 32  . GERD 08/11/2008  . HYPERLIPIDEMIA 08/11/2008  . HYPERTENSION 08/11/2008  . LIPOMA 02/10/2009  . PREDIABETES 06/15/2009  . 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 Carpal tunnel syndrome of left wrist   . Neuropathy   . Interstitial cystitis   . Brain tumor   . DEPRESSION 08/11/2008  . 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 Osteoporosis   . Hyponatremia 08/28/2011  . Anemia 08/28/2011  . History of chicken pox   . H/O measles   . H/O mumps   . Asthma     childhood  . Dermatitis   . OA (osteoarthritis) 09/07/2011  . Melanoma 2008    DR. Yetta Barre    Current Outpatient Prescriptions  Medication Sig Dispense Refill  . aspirin 81 MG tablet Take 81 mg by mouth daily.        Marland Kitchen b complex vitamins tablet Take 1 tablet by mouth daily.        . bifidobacterium infantis (  ALIGN) capsule Take 1 capsule by mouth daily.      . carvedilol (COREG) 6.25 MG tablet Take 1 tablet (6.25 mg total) by mouth 2 (two) times daily with a meal.  60 tablet  3  . cholecalciferol (VITAMIN D) 400 UNITS TABS Take 2,000 Units by mouth daily.      . Ferrous Fumarate-Folic Acid 324-1 MG TABS 1 tab po daily  30 each  3  . gabapentin (NEURONTIN) 300 MG capsule       . guaiFENesin (MUCINEX) 600 MG 12 hr tablet Take 600 mg by mouth as needed.      . hydrochlorothiazide (HYDRODIURIL) 25 MG tablet Take 1 tablet (25 mg total) by mouth daily.  30 tablet  1  . hydrOXYzine (ATARAX/VISTARIL) 25 MG tablet Take 1 tablet (25 mg total) by mouth daily.   30 tablet  3  . Krill Oil CAPS MegaRed caps daily      . LORazepam (ATIVAN) 0.5 MG tablet Take 1 tablet (0.5 mg total) by mouth at bedtime.  90 tablet  1  . omeprazole (PRILOSEC) 40 MG capsule Take 1 capsule (40 mg total) by mouth daily.  30 capsule  3  . pentosan polysulfate (ELMIRON) 100 MG capsule Take 200 mg by mouth 2 (two) times daily.       . psyllium (METAMUCIL SMOOTH TEXTURE) 28 % packet Take 1 packet by mouth daily.      . Risedronate Sodium (ATELVIA) 35 MG TBEC Take 1 tablet (35 mg total) by mouth once a week.  4 tablet  12  . rosuvastatin (CRESTOR) 20 MG tablet Take 20 mg by mouth daily.        Marland Kitchen telmisartan (MICARDIS) 80 MG tablet Take 1 tablet (80 mg total) by mouth daily.  30 tablet  3  . venlafaxine XR (EFFEXOR-XR) 75 MG 24 hr capsule Take 1 capsule (75 mg total) by mouth daily.  90 capsule  12  . Multiple Vitamins-Minerals (CENTRUM SILVER PO) Take 1 tablet by mouth daily.       . sucralfate (CARAFATE) 1 G tablet Take 1 g by mouth as needed. indigestion        Allergies:   No Known Allergies  Social History:  The patient  reports that she has never smoked. She has never used smokeless tobacco. She reports that she does not drink alcohol or use illicit drugs.   Family History:   Her family history includes Breast cancer in her mother; Cancer in her brother, father, and mother; Down syndrome in her son; Heart attack in her father; Heart disease in her father and maternal grandfather; Hypertension in her mother; Lung cancer in her mother; Prostate cancer in her brother and father; Stomach cancer in her maternal aunt; and Uterine cancer in her maternal aunt.  There is no history of Colon cancer.   ROS:  Please see the history of present illness.   All other systems reviewed and negative.   PHYSICAL EXAM: VS:  BP 124/80  Pulse 83  Resp 18  Ht 5\' 4"  (1.626 m)  Wt 210 lb (95.255 kg)  BMI 36.05 kg/m2  SpO2 99%  LMP 01/30/1994 Well nourished, well developed, in no acute  distress HEENT: normal Neck: no JVD Vascular: No carotid bruits Cardiac:  normal S1, S2; RRR; no murmur; no S3 Lungs:  clear to auscultation bilaterally, no wheezing, rhonchi or rales Abd: soft, nontender, no hepatomegaly Ext: no edema Skin: warm and dry Neuro:  CNs 2-12 intact, no focal  abnormalities noted  EKG:  NSR, HR 83, normal axis, low voltage, no ischemic changes, no change from prior tracing  ASSESSMENT AND PLAN:  1. Dyspnea:    She notes DOE over the last 2-3 mos.  It is assoc with some chest tightness.  It has been over a year since her last stress test.  She does not look volume overloaded on exam.  Her last echo demonstrated normal LVF.  She did have mild diast dysfxn.  She is not hypoxic on exam today.  She does need knee surgery in January (right TKR with Dr. Lequita Halt) and is limited by her knee pain.  She is anemic, but this is not severe.    -  Schedule Lexiscan Myoview to rule out ischemia. This will also assess EF again.    -  Check a BMET and BNP.  Change HCTZ to Lasix if BNP high.    -  Follow up with me or Dr. Daleen Squibb in 2-3 weeks.  2. Cardiomyopathy:   Likely related to Herceptin which was stopped.  Recent echo with normal LVF.  Obtain myoview as noted.  Continue coreg and micardis.  3. Hypertension:   Controlled.  Continue current therapy.   4. Hyperlipidemia:  She remains on statin.  5. Anemia:  Followed by PCP.  6. DJD:  She will have right TKR in 01/2012.  With her symptoms of dyspnea, I will have her proceed with myoview prior to clearing her for surgery.  7. Breast Cancer:   Managed by oncology.  With her hx of malignancy, I did consider pulmonary embolism in the differential for her dyspnea.  However, she has normal O2 sat on RA and no other clinical signs.  At this point, I do not think a DDimer would be helpful, but will consider it if her symptoms persist/worsen with an otherwise unremarkable workup.    Luna Glasgow, PA-C  12:56 PM 12/18/2011

## 2011-12-18 NOTE — Patient Instructions (Addendum)
LABS TODAY; BMET, BNP  LEXISCAN DX 786.50, 786.05  A REFILL FOR CARVEDILOL WAS SENT IN TODAY  FOLLOW UP WITH SCOTT WEAVER, PAC SAME DAY DR. WALL IS IN THE OFFICE

## 2011-12-18 NOTE — Telephone Encounter (Signed)
Message copied by Tarri Fuller on Mon Dec 18, 2011  5:30 PM ------      Message from: [place], [place] T      Created: Mon Dec 18, 2011  4:47 PM       Potassium and kidney function look good.      Normal BNP - no sign of CHF.      Continue with current treatment plan.      Tereso Newcomer, PA-C  4:49 PM 12/14/2011

## 2011-12-25 ENCOUNTER — Ambulatory Visit (HOSPITAL_COMMUNITY): Payer: Medicare Other | Attending: Cardiology | Admitting: Radiology

## 2011-12-25 VITALS — BP 100/70 | Ht 64.0 in | Wt 208.0 lb

## 2011-12-25 DIAGNOSIS — R079 Chest pain, unspecified: Secondary | ICD-10-CM

## 2011-12-25 DIAGNOSIS — R0602 Shortness of breath: Secondary | ICD-10-CM | POA: Diagnosis not present

## 2011-12-25 DIAGNOSIS — I429 Cardiomyopathy, unspecified: Secondary | ICD-10-CM | POA: Diagnosis not present

## 2011-12-25 MED ORDER — REGADENOSON 0.4 MG/5ML IV SOLN
0.4000 mg | Freq: Once | INTRAVENOUS | Status: AC
  Administered 2011-12-25: 0.4 mg via INTRAVENOUS

## 2011-12-25 MED ORDER — TECHNETIUM TC 99M SESTAMIBI GENERIC - CARDIOLITE
10.0000 | Freq: Once | INTRAVENOUS | Status: AC | PRN
  Administered 2011-12-25: 10 via INTRAVENOUS

## 2011-12-25 MED ORDER — TECHNETIUM TC 99M SESTAMIBI GENERIC - CARDIOLITE
30.0000 | Freq: Once | INTRAVENOUS | Status: AC | PRN
  Administered 2011-12-25: 30 via INTRAVENOUS

## 2011-12-25 NOTE — Progress Notes (Signed)
[facility] SITE 3 NUCLEAR MED [id number] [place] [address] [phone]  Cardiology Nuclear Med Study  Shannon [name] is a 66 y.o. female     MRN : [id number]     DOB: 1945/07/09  Procedure Date: 12/25/2011  Nuclear Med Background Indication for Stress Test:  Evaluation for Ischemia and Surgical Clearance: Preop for knee surgery with Dr. Despina Hick 02/05/12 History:  Asthma and Cardiomyopathy 2nd to Chemo, 10/12 yrs ago GXT: NL per pt  2/12 MPS: EF: 74% (-) ischemia, 7/12 ECHO: EF: 60-65%  Cardiac Risk Factors: Family History - CAD, Hypertension and Lipids  Symptoms:  Chest Tightness and DOE   Nuclear Pre-Procedure Caffeine/Decaff Intake:  9:30pm NPO After: 6:30am   Lungs:  clear O2 Sat: 95% on room air. IV 0.9% NS with Angio Cath:  22g  IV Site: L Antecubital  IV Started by:  Shannon Parsons, RN  Chest Size (in):  42 Cup Size: C  Height: 5\' 4"  (1.626 m)  Weight:  208 lb (94.348 kg)  BMI:  Body mass index is 35.70 kg/(m^2). Tech Comments:  Coreg held x 15 hrs    Nuclear Med Study 1 or 2 day study: 1 day  Stress Test Type:  Lexiscan  Reading MD: Shannon Haws, MD  Order Authorizing Provider:  Maisie Fus Wall,MD/Shannon [name]  Resting Radionuclide: Technetium 51m Sestamibi  Resting Radionuclide Dose: 8,9 mCi   Stress Radionuclide:  Technetium 65m Sestamibi  Stress Radionuclide Dose: 31.1 mCi           Stress Protocol Rest HR: 72 Stress HR: 106  Rest BP: 100/70 Stress BP: 116/66  Exercise Time (min): n/a METS: n/a   Predicted Max HR: 153 bpm % Max HR: 69.28 bpm Rate Pressure Product: 62952   Dose of Adenosine (mg):  n/a Dose of Lexiscan: 0.4 mg  Dose of Atropine (mg): n/a Dose of Dobutamine: n/a mcg/kg/min (at max HR)  Stress Test Technologist: Shannon [name], EMT-P  Nuclear Technologist:  Shannon [name], CNMT     Rest Procedure:  Myocardial perfusion imaging was performed at rest 45 minutes following the intravenous  administration of Technetium 84m Sestamibi. Rest ECG: NSR - Normal EKG  Stress Procedure:  The patient received IV Lexiscan 0.4 mg over 15-seconds.  Technetium 60m Sestamibi injected at 30-seconds.  There were no significant changes with Lexiscan.  Quantitative spect images were obtained after a 45 minute delay. Stress ECG: No significant change from baseline ECG  QPS Raw Data Images:  Normal; no motion artifact; normal heart/lung ratio. Stress Images:  There is mild apical thinning with normal uptake in other regions. Rest Images:  There is mild apical thinning with normal uptake in other regions. Subtraction (SDS):  No evidence of ischemia. Transient Ischemic Dilatation (Normal <1.22):  1.05 Lung/Heart Ratio (Normal <0.45):  0.32  Quantitative Gated Spect Images QGS EDV:  63 ml QGS ESV:  15 ml  Impression Exercise Capacity:  Lexiscan with no exercise. BP Response:  Normal blood pressure response. Clinical Symptoms:  No significant symptoms noted. ECG Impression:  No significant ST segment change suggestive of ischemia. Comparison with Prior Nuclear Study: No images to compare  Overall Impression:  Normal stress nuclear study.  No evidence of ischemia.    LV Ejection Fraction: 76%.  LV [name] Motion:  NL LV Function; NL [name] Motion.    Shannon [name], Shannon [name]., MD, [facility] 12/26/2011, 9:45 AM Office - [phone] Pager [phone]

## 2011-12-26 ENCOUNTER — Encounter: Payer: Self-pay | Admitting: Physician Assistant

## 2011-12-27 ENCOUNTER — Telehealth: Payer: Self-pay | Admitting: *Deleted

## 2011-12-27 NOTE — Telephone Encounter (Signed)
Message copied by Tarri Fuller on Wed Dec 27, 2011  3:30 PM ------      Message from: [place], [place] T      Created: Tue Dec 26, 2011  5:11 PM       Normal EF.      No ischemia.      No further cardiac workup prior to non-cardiac surgery.  She is at acceptable risk.       Continue with current treatment plan.      Tereso Newcomer, PA-C  5:10 PM 12/26/2011

## 2011-12-27 NOTE — Telephone Encounter (Signed)
lmptcb about myoview results, normal EF, ok to proceeed w/non-cardiac surgery, no changes to be made

## 2012-01-03 ENCOUNTER — Ambulatory Visit (INDEPENDENT_AMBULATORY_CARE_PROVIDER_SITE_OTHER): Payer: Medicare Other | Admitting: Physician Assistant

## 2012-01-03 VITALS — BP 90/58 | HR 79 | Ht 64.0 in | Wt 212.8 lb

## 2012-01-03 DIAGNOSIS — K219 Gastro-esophageal reflux disease without esophagitis: Secondary | ICD-10-CM | POA: Diagnosis not present

## 2012-01-03 DIAGNOSIS — R0602 Shortness of breath: Secondary | ICD-10-CM

## 2012-01-03 DIAGNOSIS — IMO0001 Reserved for inherently not codable concepts without codable children: Secondary | ICD-10-CM

## 2012-01-03 DIAGNOSIS — I10 Essential (primary) hypertension: Secondary | ICD-10-CM

## 2012-01-03 DIAGNOSIS — I429 Cardiomyopathy, unspecified: Secondary | ICD-10-CM

## 2012-01-03 MED ORDER — OMEPRAZOLE 40 MG PO CPDR: 40.0000 mg | DELAYED_RELEASE_CAPSULE | ORAL | Status: DC

## 2012-01-03 NOTE — Patient Instructions (Addendum)
KEEP FOLLOW UP APPT WITH DR. WALL IN FEBRUARY 2014  INCREASE PRILOSEC TO TWICE DAILY FOR 2 WEEKS THEN RESUME TO DAILY UNTIL YOU SEE GI  STOP HCTZ

## 2012-01-03 NOTE — Progress Notes (Signed)
[address], [address] [place], [place]  [address] Phone: [phone], Fax:  [phone]  Date:  01/03/2012   Name:  Shannon [name]   DOB:  1945/05/01   MRN:  [id number]  PCP:  Danise Edge, MD  Primary Cardiologist:  Dr. Valera Castle  Primary Electrophysiologist:  None    History of Present Illness: Shannon [name] is a 66 y.o. female who returns for follow up on dyspnea.  She has a hx of cardiomyopathy. She was initially evaluated by Dr. Daleen Squibb in 2/12. EF by echocardiogram in May 2011 was normal. She has been treated with chemotherapy for breast cancer. This included Herceptin. Follow up echocardiogram demonstrated reduced ejection fraction of 45-50%. Nuclear study 2/12 was low risk with apical perfusion defect-question small area of ischemia versus shifting breast attenuation. No further ischemic evaluation was felt to be necessary. Patient was placed on medical therapy and followup echocardiogram demonstrated normalized LV function.  Last seen by Dr. Daleen Squibb 12/12.  I saw her on 11/18 with complaints of dyspnea and chest tightness. I set her up for a [employer]. This demonstrated no ischemia and an EF of 76%.  Continues to have dyspnea with more extreme exertion. In retrospect, her knee DJD seems to be limiting her more and more. She also has increased acid reflux. She does have a prior history of asthma and has noted some wheezing. She denies orthopnea, PND or edema. She denies syncope. She has chest discomfort from dyspepsia.  Labs (11/13):    K 4.1, creatinine 1.1, ALT 26, LDL 103, Hgb 10.8, TSH 1.96, BNP 34  Wt Readings from Last 3 Encounters:  01/03/12 212 lb 12.8 oz (96.525 kg)  12/25/11 208 lb (94.348 kg)  12/18/11 210 lb (95.255 kg)     Past Medical History  Diagnosis Date  . NICM (nonischemic cardiomyopathy) 03/08/2010    likely 2/2 chemotx - a. Echo 2012: EF 45-50%;  b. Lex MV 2/12:  low risk, apical defect (small area of ischemia vs shifting breast atten);   c.  Echo 7/12: Normal wall thickness, EF 60-65%, normal wall motion, grade 1 diastolic dysfunction, mild LAE, PASP 32;   d. Lex MV 11/13:  EF 76%, no ischemia  . GERD 08/11/2008  . HYPERLIPIDEMIA 08/11/2008  . HYPERTENSION 08/11/2008  . LIPOMA 02/10/2009  . PREDIABETES 06/15/2009  . 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 Carpal tunnel syndrome of left wrist   . Neuropathy   . Interstitial cystitis   . Brain tumor   . DEPRESSION 08/11/2008  . 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 Osteoporosis   . Hyponatremia 08/28/2011  . Anemia 08/28/2011  . History of chicken pox   . H/O measles   . H/O mumps   . Asthma     childhood  . Dermatitis   . OA (osteoarthritis) 09/07/2011  . Melanoma 2008    DR. Yetta Barre    Current Outpatient Prescriptions  Medication Sig Dispense Refill  . aspirin 81 MG tablet Take 81 mg by mouth daily.        Marland Kitchen b complex vitamins tablet Take 1 tablet by mouth daily.        . bifidobacterium infantis (ALIGN) capsule Take 1 capsule by mouth daily.      . carvedilol (COREG) 6.25 MG tablet Take 1 tablet (6.25 mg total) by mouth 2 (two) times daily  with a meal.  60 tablet  6  . cholecalciferol (VITAMIN D) 400 UNITS TABS Take 2,000 Units by mouth daily.      . Ferrous Fumarate-Folic Acid 324-1 MG TABS 1 tab po daily  30 each  3  . gabapentin (NEURONTIN) 300 MG capsule       . guaiFENesin (MUCINEX) 600 MG 12 hr tablet Take 600 mg by mouth as needed.      . hydrochlorothiazide (HYDRODIURIL) 25 MG tablet Take 1 tablet (25 mg total) by mouth daily.  30 tablet  1  . hydrOXYzine (ATARAX/VISTARIL) 25 MG tablet Take 1 tablet (25 mg total) by mouth daily.  30 tablet  3  . Krill Oil CAPS MegaRed caps daily      . LORazepam (ATIVAN) 0.5 MG tablet Take 1 tablet (0.5 mg total) by mouth at bedtime.  90 tablet  1  . Multiple Vitamins-Minerals (CENTRUM SILVER PO) Take 1 tablet by mouth daily.       Marland Kitchen omeprazole  (PRILOSEC) 40 MG capsule Take 1 capsule (40 mg total) by mouth daily.  30 capsule  3  . pentosan polysulfate (ELMIRON) 100 MG capsule Take 200 mg by mouth 2 (two) times daily.       . psyllium (METAMUCIL SMOOTH TEXTURE) 28 % packet Take 1 packet by mouth daily.      . Risedronate Sodium (ATELVIA) 35 MG TBEC Take 1 tablet (35 mg total) by mouth once a week.  4 tablet  12  . rosuvastatin (CRESTOR) 20 MG tablet Take 20 mg by mouth daily.        Marland Kitchen telmisartan (MICARDIS) 80 MG tablet Take 1 tablet (80 mg total) by mouth daily.  30 tablet  3  . venlafaxine XR (EFFEXOR-XR) 75 MG 24 hr capsule Take 1 capsule (75 mg total) by mouth daily.  90 capsule  12  . sucralfate (CARAFATE) 1 G tablet Take 1 g by mouth as needed. indigestion        Allergies:   No Known Allergies  Social History:  The patient  reports that she has never smoked. She has never used smokeless tobacco. She reports that she does not drink alcohol or use illicit drugs.   ROS:  Please see the history of present illness.   All other systems reviewed and negative.   PHYSICAL EXAM: VS:  BP 90/58  Pulse 79  Ht 5\' 4"  (1.626 m)  Wt 212 lb 12.8 oz (96.525 kg)  BMI 36.53 kg/m2  LMP 01/30/1994 Well nourished, well developed, in no acute distress HEENT: normal Neck: no JVD Cardiac:  normal S1, S2; RRR; no murmur; no S3 Lungs:  clear to auscultation bilaterally, no wheezing, rhonchi or rales Abd: soft, nontender, no hepatomegaly Ext: no edema Skin: warm and dry Neuro:  CNs 2-12 intact, no focal abnormalities noted  EKG:  NSR, HR 79, normal axis, low voltage, no ischemic changes, no change from prior tracing  ASSESSMENT AND PLAN:  1. Dyspnea:    Cardiac workup unremarkable. I suspect her dyspnea is likely related to deconditioning from her knee arthritis. In addition, she has had increased dyspepsia. She has a history of asthma and likely has chronic airway hyperreactivity. This is probably worsened her dyspnea as well. No further  cardiac workup.  2. Cardiomyopathy:   Likely related to Herceptin which was stopped.  Continue ARB and beta blocker. Recent Myoview with normal LV function.  3. Hypertension:   She is somewhat hypotensive. She has felt weak and tired  at times. Discontinue HCTZ.   4. Hyperlipidemia:  She remains on statin.  5. Anemia:  Followed by PCP.  6. DJD:  No further workup prior to her noncardiac surgery. She should be at acceptable risk.  7. GERD:   Increase Prilosec to bid x 2 weeks.  Follow up with PCP as planned. Consider referral back to GI.  Luna Glasgow, PA-C  9:38 AM 01/03/2012

## 2012-01-08 DIAGNOSIS — IMO0002 Reserved for concepts with insufficient information to code with codable children: Secondary | ICD-10-CM | POA: Diagnosis not present

## 2012-01-08 DIAGNOSIS — M1710 Unilateral primary osteoarthritis, unspecified knee: Secondary | ICD-10-CM | POA: Diagnosis not present

## 2012-01-10 ENCOUNTER — Encounter: Payer: Self-pay | Admitting: Women's Health

## 2012-01-10 ENCOUNTER — Ambulatory Visit (INDEPENDENT_AMBULATORY_CARE_PROVIDER_SITE_OTHER): Payer: Medicare Other | Admitting: Women's Health

## 2012-01-10 ENCOUNTER — Other Ambulatory Visit (HOSPITAL_COMMUNITY)
Admission: RE | Admit: 2012-01-10 | Discharge: 2012-01-10 | Disposition: A | Discharge status: Home / Self Care | Payer: Medicare Other | Source: Ambulatory Visit | Attending: Women's Health | Admitting: Women's Health

## 2012-01-10 VITALS — BP 112/78 | Ht 63.0 in | Wt 211.0 lb

## 2012-01-10 DIAGNOSIS — Z124 Encounter for screening for malignant neoplasm of cervix: Secondary | ICD-10-CM | POA: Diagnosis not present

## 2012-01-10 DIAGNOSIS — Z1151 Encounter for screening for human papillomavirus (HPV): Secondary | ICD-10-CM | POA: Diagnosis not present

## 2012-01-10 DIAGNOSIS — M810 Age-related osteoporosis without current pathological fracture: Secondary | ICD-10-CM

## 2012-01-10 MED ORDER — RISEDRONATE SODIUM 35 MG PO TBEC: 35.0000 mg | DELAYED_RELEASE_TABLET | ORAL | Status: DC

## 2012-01-10 NOTE — Progress Notes (Signed)
Shannon [name] November 12, 1945 [id number]    History:    The patient presents for breast and pelvic exam. History of right breast cancer with chemotherapy/lumpectomy/radiation 2011. HER 11 positive, BRCA negative. Normal colonoscopy in 2010. Scheduled for a total knee replacement January 2014. Primary care manages GERD, hypercholesteremia, hypertension, DEXA 03/2011 T score -2.6 at spine, -2.0 adnexa, 7% decrease in BMD. Started on Atelvia 35 mg weekly February 2013. History of a benign brain tumor 2000.   Past medical history, past surgical history, family history and social history were all reviewed and documented in the EPIC chart. Shannon [name] 43 with Down's having memory problems. Owns a [employer], political party.   ROS:  A  ROS was performed and pertinent positives and negatives are included in the history.  Exam:  Filed Vitals:   01/10/12 1403  BP: 112/78    General appearance:  Normal Head/Neck:  Normal, without cervical or supraclavicular adenopathy. Thyroid:  Symmetrical, normal in size, without palpable masses or nodularity. Respiratory  Effort:  Normal  Auscultation:  Clear without wheezing or rhonchi Cardiovascular  Auscultation:  Regular rate, without rubs, murmurs or gallops  Edema/varicosities:  Not grossly evident Abdominal  Soft,nontender, without masses, guarding or rebound.  Liver/spleen:  No organomegaly noted  Hernia:  None appreciated  Skin  Inspection:  Grossly normal  Palpation:  Grossly normal Neurologic/psychiatric  Orientation:  Normal with appropriate conversation.  Mood/affect:  Normal  Genitourinary    Breasts: Examined lying and sitting.     Right: Without masses, retractions, discharge or axillary adenopathy.     Left: Without masses, retractions, discharge or axillary adenopathy.   Inguinal/mons:  Normal without inguinal adenopathy  External genitalia:  Normal  BUS/Urethra/Skene's glands:  Normal  Bladder:  Normal  Vagina:  Normal  Cervix:  Normal  Uterus:    normal in size, shape and contour.  Midline and mobile  Adnexa/parametria:     Rt: Without masses or tenderness.   Lt: Without masses or tenderness.  Anus and perineum: Normal  Digital rectal exam: Normal sphincter tone without palpated masses or tenderness  Assessment/Plan:  66 y.o. M. WF G3 P3 for breast and pelvic exam without complaint.   Osteoporosis-Atelvia 35 weekly Scheduled for right knee replacement January 2014 Right breast cancer 2011 normal mammograms after Hypertension/hypercholesterolemia/GERD/IC-primary care labs and meds History of melanoma (2006)-chest-continue annual  skin check  Plan: Continue Atelvia 35 mg weekly, prescription, proper use, coupon given. Home safety and fall prevention discussed. Continue SBE's annual mammogram. Reviewed importance of calcium rich diet, exercise as able, vitamin D 2000 daily. Pap, normal Pap 2011. New screening guidelines reviewed.  [name] [facility], 6:17 PM 01/10/2012

## 2012-01-10 NOTE — Patient Instructions (Addendum)

## 2012-01-11 ENCOUNTER — Ambulatory Visit: Payer: Medicare Other | Admitting: Physician Assistant

## 2012-01-15 ENCOUNTER — Telehealth: Payer: Self-pay | Admitting: Family Medicine

## 2012-01-15 NOTE — Telephone Encounter (Signed)
Pt states she needed to know if the cardiologist sent the paperwork. I told the patient the best thing to do is call the surgeons office and make sure they received everything.

## 2012-01-16 ENCOUNTER — Ambulatory Visit: Payer: Medicare Other | Admitting: Family Medicine

## 2012-01-25 ENCOUNTER — Encounter (HOSPITAL_COMMUNITY): Payer: Self-pay | Admitting: Pharmacy Technician

## 2012-01-29 ENCOUNTER — Encounter (HOSPITAL_COMMUNITY)
Admission: RE | Admit: 2012-01-29 | Discharge: 2012-01-29 | Disposition: A | Discharge status: Home / Self Care | Payer: Medicare Other | Source: Ambulatory Visit | Attending: Orthopedic Surgery | Admitting: Orthopedic Surgery

## 2012-01-29 ENCOUNTER — Encounter (HOSPITAL_COMMUNITY): Payer: Self-pay

## 2012-01-29 ENCOUNTER — Other Ambulatory Visit: Payer: Self-pay | Admitting: Orthopedic Surgery

## 2012-01-29 HISTORY — DX: Personal history of other medical treatment: Z92.89

## 2012-01-29 HISTORY — DX: Shortness of breath: R06.02

## 2012-01-29 LAB — CBC
HCT: 34.5 % — ABNORMAL LOW (ref 36.0–46.0)
MCHC: 32.2 g/dL (ref 30.0–36.0)
MCV: 89.8 fL (ref 78.0–100.0)
Platelets: 301 10*3/uL (ref 150–400)
RDW: 14 % (ref 11.5–15.5)
WBC: 6.4 10*3/uL (ref 4.0–10.5)

## 2012-01-29 LAB — URINE MICROSCOPIC-ADD ON

## 2012-01-29 LAB — URINALYSIS, ROUTINE W REFLEX MICROSCOPIC
Glucose, UA: NEGATIVE mg/dL
Hgb urine dipstick: NEGATIVE
Ketones, ur: NEGATIVE mg/dL
Protein, ur: NEGATIVE mg/dL

## 2012-01-29 LAB — COMPREHENSIVE METABOLIC PANEL
ALT: 26 U/L (ref 0–35)
Alkaline Phosphatase: 94 U/L (ref 39–117)
CO2: 31 mEq/L (ref 19–32)
GFR calc Af Amer: 79 mL/min — ABNORMAL LOW (ref >90)
GFR calc non Af Amer: 68 mL/min — ABNORMAL LOW (ref >90)
Glucose, Bld: 92 mg/dL (ref 70–99)
Total Bilirubin: 0.3 mg/dL (ref 0.3–1.2)

## 2012-01-29 NOTE — Patient Instructions (Addendum)
20 Shannon [name]  01/29/2012   Your procedure is scheduled on:  02/05/12  MONDAY  Report to [facility] at 1125      AM.  Call this number if you have problems the morning of surgery: [phone]       Remember:   Do not eat food  After midnight Sunday NIGHT/      MAY HAVE CLEAR LIQUIDS- no creamer-  Monday MORNING UNTIL 0730 AM,  THEN NOTHING BY MOUTH   Take these medicines the morning of surgery with A SIP OF WATER:   COREG, PROLISEC, SUCRALFATE   .  Contacts, dentures or partial plates can not be worn to surgery  Leave suitcase in the car. After surgery it may be brought to your room.  For patients admitted to the hospital, checkout time is 11:00 AM day of  discharge.             SPECIAL INSTRUCTIONS- SEE [place] PREPARING FOR SURGERY INSTRUCTION SHEET-     DO NOT WEAR JEWELRY, LOTIONS, POWDERS, OR PERFUMES.  WOMEN-- DO NOT SHAVE LEGS OR UNDERARMS FOR 12 HOURS BEFORE SHOWERS. MEN MAY SHAVE FACE.  Patients discharged the day of surgery will not be allowed to drive home. IF going home the day of surgery, you must have a driver and someone to stay with you for the first 24 hours  Name and phone number of your driver:      admission                                                                  Please read over the following fact sheets that you were given: MRSA Information, Incentive Spirometry Sheet, Blood Transfusion Sheet  Information                                                                                   [name]  PST [phone]  [phone]                 FAILURE TO FOLLOW THESE INSTRUCTIONS MAY RESULT IN  CANCELLATION   OF YOUR SURGERY                                                  Patient Signature _____________________________

## 2012-01-29 NOTE — H&P (Signed)
Shannon [name]  DOB: 05/31/1945 Married / Language: English / Race: White Female  Date of Admission:  02/05/2012  Chief Complaint:  Right Knee Pain  History of Present Illness The patient is a 66 year old female who comes in for a preoperative History and Physical. The patient is scheduled for a right total knee arthroplasty to be performed by Dr. Frank V. Aluisio, MD at [facility] on 02/05/2012. The patient is a 66 year old female who presents today for follow up of their knee. The patient is being followed for their right knee pain and osteoarthritis. Symptoms reported today include: pain. and report their pain level to be 8 / 10 (She is also wanting a cortisone injection today.). The following medication has been used for pain control: Advil. Her knee xrays were updated. The right knee has gotten progressively worse. She has severe erosive patellofemoral arthritis. She has some medial compartment involvement also, but patellofemoral is the worse. She has eroded away about a third of her patella and has a flattening of the trochlear. The pain has gotten to the point where it is hurting her at all times. The injections didn't help her at all. She is at a stage now where she wants to get this fixed. Cortisone and Visco supplements have not helped. She is ready to get the knee fixed. They have been treated conservatively in the past for the above stated problem and despite conservative measures, they continue to have progressive pain and severe functional limitations and dysfunction. They have failed non-operative management including home exercise, medications, and injections. It is felt that they would benefit from undergoing total joint replacement. Risks and benefits of the procedure have been discussed with the patient and they elect to proceed with surgery. There are no active contraindications to surgery such as ongoing infection or rapidly progressive neurological  disease.  Problem List Primary osteoarthritis of one knee, right (715.16)  Allergies No Known Drug Allergies  Family History Heart Disease. father, grandmother mothers side and grandfather mothers side Osteoporosis. mother Osteoarthritis. father Cancer. mother Congestive Heart Failure. father Hypertension. mother  Social History Drug/Alcohol Rehab (Currently). no Tobacco / smoke exposure. no Current work status. working part time Marital status. married Number of flights of stairs before winded. 2-3 Tobacco use. never smoker Exercise. Exercises rarely Illicit drug use. no Alcohol use. current drinker; only occasionally per week Children. 3 Living situation. live with spouse Pain Contract. no Copy of Drug/Alcohol Rehab (Previously). no Post-Surgical Plans. Plan is for home. Advance Directives. Living Will, Healthcare POA  Medication History Elmiron (100MG Capsule, Oral) Active. Crestor ( Oral) Specific dose unknown - Active. Gabapentin ( Oral) Specific dose unknown - Active. LORazepam ( Oral) Specific dose unknown - Active. Venlafaxine HCl ( Oral) Specific dose unknown - Active. NexIUM ( Oral) Specific dose unknown - Active. HydrOXYzine HCl ( Oral) Specific dose unknown - Active. Carvedilol ( Oral) Specific dose unknown - Active. Micardis ( Oral) Specific dose unknown - Active. Aspirin Childrens (81MG Tablet Chewable, Oral) Active. Multivitamin ( Oral) Active. Fish Oil Active. Vitamin B Complex-8 ( Oral) Specific dose unknown - Active. Vitamin E Complex ( Oral) Specific dose unknown - Active. Vitamin D ( Oral) Specific dose unknown - Active. Glucosamine Chondroitin Complx ( Oral) Active.  Pregnancy / Birth History Pregnant. no  Past Surgical History Breast Mass; Local Excision. Date: 2011. right Tubal Ligation. Date: 1977. Tonsillectomy Cataract Surgery. right Carpal Tunnel Repair. left Colon Polyp Removal -  Colonoscopy Gallbladder Surgery. laporoscopic   Excision of Pituitary Mass. Date: 2000.  Medical History Peripheral Neuropathy Osteoarthritis Osteoporosis Gastroesophageal Reflux Disease Breast Cancer. Right Sided Migraine Headache Skin Cancer. Melanoma High blood pressure Asthma Hypercholesterolemia Chronic Cystitis Hiatal Hernia Impaired Vision Menopause  Review of Systems General:Not Present- Chills, Fever, Night Sweats, Fatigue, Weight Gain, Weight Loss and Memory Loss. Skin:Not Present- Hives, Itching, Rash, Eczema and Lesions. HEENT:Not Present- Tinnitus, Headache, Double Vision, Visual Loss, Hearing Loss and Dentures. Respiratory:Present- Shortness of breath with exertion. Not Present- Shortness of breath at rest, Allergies, Coughing up blood and Chronic Cough. Cardiovascular:Not Present- Chest Pain, Racing/skipping heartbeats, Difficulty Breathing Lying Down, Murmur, Swelling and Palpitations. Gastrointestinal:Present- Heartburn. Not Present- Bloody Stool, Abdominal Pain, Vomiting, Nausea, Constipation, Diarrhea, Difficulty Swallowing, Jaundice and Loss of appetitie. Female Genitourinary:Not Present- Blood in Urine, Urinary frequency, Weak urinary stream, Discharge, Flank Pain, Incontinence, Painful Urination, Urgency, Urinary Retention and Urinating at Night. Musculoskeletal:Present- Joint Pain, Back Pain, Morning Stiffness and Spasms. Not Present- Muscle Weakness, Muscle Pain and Joint Swelling. Neurological:Not Present- Tremor, Dizziness, Blackout spells, Paralysis, Difficulty with balance and Weakness. Psychiatric:Not Present- Insomnia.  Vitals Weight: 208 lb Height: 64 in Body Surface Area: 2.06 m Body Mass Index: 35.7 kg/m Pulse: 84 (Regular) Resp.: 14 (Unlabored) BP: 126/80 (Sitting, Right Arm, Standard)  Physical Exam The physical exam findings are as follows:  Note: Patient is a 66 year old female with continued knee  pain.  General Mental Status - Alert, cooperative and good historian. General Appearance- pleasant. Not in acute distress. Orientation- Oriented X3. Build & Nutrition- Overweight, Well nourished and Well developed.   Head and Neck Head- normocephalic, atraumatic . Neck Global Assessment- supple. no bruit auscultated on the right and no bruit auscultated on the left.   Eye Pupil- Bilateral- Regular and Round. Note: wears galsses Motion- Bilateral- EOMI. 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  Chest and Lung Exam Auscultation: Breath sounds:- clear at anterior chest wall and - clear at posterior chest wall. Adventitious sounds:- No Adventitious sounds.   Cardiovascular Auscultation:Rhythm- Regular rate and rhythm. Heart Sounds- S1 WNL and S2 WNL. Murmurs & Other Heart Sounds:Auscultation of the heart reveals - No Murmurs.   Abdomen Inspection:Contour- Generalized mild distention. Palpation/Percussion:Tenderness- Abdomen is non-tender to palpation. Rigidity (guarding)- Abdomen is soft. Auscultation:Auscultation of the abdomen reveals - Bowel sounds normal.   Female Genitourinary  Not done, not pertinent to present illness  Musculoskeletal Well developed female, alert and oriented in no apparent distress. Her right knee shows no effusion. There is marked crepitus on range of motion. Range is 5 to 125. There is no instability.  RADIOGRAPHS: The radiographs with her again demonstrating the pertinent pathology, she has advanced patellofemoral arthritis, bone on bone.  Assessment & Plan Primary osteoarthritis of one knee, right (715.16)  Note: Plan is for a right total knee replacement by Dr. Aluisio.  Plan is to go home after the hospital stay.  PCP - Dr. Stacy Blyth Cards - Dr. Wall Urology - Dr. Robert Evans Oncology - Dr. Magrinat GI - Dr. Kaplan  Signed electronically by DREW L Lindsay Soulliere,  PA-C 

## 2012-01-29 NOTE — Progress Notes (Signed)
CARDIAC CLEARANCE  S WEAVER PA   IN EPIC NOTE DATED 01/03/12

## 2012-01-29 NOTE — Progress Notes (Signed)
Nuclear stress test 11/13 EPIC,  EKG and LOV Wende Mott PA at [facility] 12/13  In [place],  Chest CT 3/13 EPIC Clearance with note Dr Abner Greenspan on chart

## 2012-01-29 NOTE — Progress Notes (Signed)
01/29/12 1156  OBSTRUCTIVE SLEEP APNEA  Have you ever been diagnosed with sleep apnea through a sleep study? No  If yes, do you have and use a CPAP or BPAP machine every night? 0  Do you snore loudly (loud enough to be heard through closed doors)?  0  Do you often feel tired, fatigued, or sleepy during the daytime? 1  Has anyone observed you stop breathing during your sleep? 0  Do you have, or are you being treated for high blood pressure? 1  BMI more than 35 kg/m2? 1  Age over 63 years old? 1  Neck circumference greater than 40 cm/18 inches? 0  Gender: 0  Obstructive Sleep Apnea Score 4   Score 4 or greater  Results sent to PCP

## 2012-01-29 NOTE — Progress Notes (Signed)
Faxed abnormal urine with micro to Dr Aluisio/ Gareth Eagle through Epic for review.Spoke with Toni Amend at [employer] and notified of impending fax

## 2012-01-29 NOTE — Progress Notes (Signed)
Instructed patient to call Dr Salina April today  office for clarification from D Julien Girt if she can continue taking Pentosan pre op-   Also instructed her if she cannot find instructions regarding aspirin, motrin and vitamins  Usage pre op to check on this as well

## 2012-01-30 ENCOUNTER — Other Ambulatory Visit: Payer: Self-pay | Admitting: Oncology

## 2012-01-30 LAB — URINE CULTURE

## 2012-01-31 HISTORY — PX: OTHER SURGICAL HISTORY: SHX169

## 2012-02-01 ENCOUNTER — Telehealth: Payer: Self-pay

## 2012-02-01 MED ORDER — LORAZEPAM 0.5 MG PO TABS: 0.5000 mg | ORAL_TABLET | Freq: Every day | ORAL | Status: DC

## 2012-02-01 NOTE — Telephone Encounter (Signed)
Message copied by Court Joy on Thu Feb 01, 2012  1:16 PM ------      Message from: Danise Edge A      Created: Thu Feb 01, 2012  1:04 PM      Regarding: FW: Refill request       OK to take over the Lorazepam with same 0.5 mg tabs same sig, number 90 with 2 rf for now.            THX      ----- Message -----         From: Wandalee Ferdinand, RN         Sent: 02/01/2012   9:26 AM           To: Bradd Canary, MD      Subject: Refill request                                           Dr. Darnelle Catalan refilled Lorazepam 0.5 mg #90      Request you take over this since she is no longer in active treatment for breast cancer.            Thank You,      Kem Boroughs, RN      Triage [facility]

## 2012-02-01 NOTE — Progress Notes (Signed)
Faxed note through EPIC to Dr Aluisio/ Avel Peace to review urine culture report from 01/29/12

## 2012-02-02 NOTE — Progress Notes (Signed)
Received fax from Estanislado Spire RTR at Dr Salina April office that Cipro was called in for patient

## 2012-02-04 MED ORDER — CEFAZOLIN SODIUM-DEXTROSE 2-3 GM-% IV SOLR
2.0000 g | INTRAVENOUS | Status: AC
  Administered 2012-02-05: 2 g via INTRAVENOUS

## 2012-02-05 ENCOUNTER — Encounter (HOSPITAL_COMMUNITY): Payer: Self-pay | Admitting: Anesthesiology

## 2012-02-05 ENCOUNTER — Encounter (HOSPITAL_COMMUNITY): Payer: Self-pay | Admitting: *Deleted

## 2012-02-05 ENCOUNTER — Encounter (HOSPITAL_COMMUNITY)
Admission: RE | Disposition: A | Discharge status: Home Health | Payer: Self-pay | Source: Ambulatory Visit | Attending: Orthopedic Surgery

## 2012-02-05 ENCOUNTER — Inpatient Hospital Stay (HOSPITAL_COMMUNITY)
Admission: RE | Admit: 2012-02-05 | Discharge: 2012-02-08 | DRG: 470 | Disposition: A | Discharge status: Home Health | Payer: Medicare Other | Source: Ambulatory Visit | Attending: Orthopedic Surgery | Admitting: Orthopedic Surgery

## 2012-02-05 ENCOUNTER — Inpatient Hospital Stay (HOSPITAL_COMMUNITY): Payer: Medicare Other | Admitting: Anesthesiology

## 2012-02-05 DIAGNOSIS — M1710 Unilateral primary osteoarthritis, unspecified knee: Secondary | ICD-10-CM | POA: Diagnosis not present

## 2012-02-05 DIAGNOSIS — M25569 Pain in unspecified knee: Secondary | ICD-10-CM | POA: Diagnosis not present

## 2012-02-05 DIAGNOSIS — I10 Essential (primary) hypertension: Secondary | ICD-10-CM | POA: Diagnosis present

## 2012-02-05 DIAGNOSIS — Z96659 Presence of unspecified artificial knee joint: Secondary | ICD-10-CM

## 2012-02-05 DIAGNOSIS — E871 Hypo-osmolality and hyponatremia: Secondary | ICD-10-CM

## 2012-02-05 DIAGNOSIS — K219 Gastro-esophageal reflux disease without esophagitis: Secondary | ICD-10-CM | POA: Diagnosis not present

## 2012-02-05 DIAGNOSIS — D649 Anemia, unspecified: Secondary | ICD-10-CM | POA: Diagnosis not present

## 2012-02-05 DIAGNOSIS — Z01812 Encounter for preprocedural laboratory examination: Secondary | ICD-10-CM | POA: Diagnosis not present

## 2012-02-05 DIAGNOSIS — M179 Osteoarthritis of knee, unspecified: Secondary | ICD-10-CM | POA: Diagnosis present

## 2012-02-05 DIAGNOSIS — IMO0002 Reserved for concepts with insufficient information to code with codable children: Secondary | ICD-10-CM | POA: Diagnosis not present

## 2012-02-05 DIAGNOSIS — E785 Hyperlipidemia, unspecified: Secondary | ICD-10-CM

## 2012-02-05 HISTORY — PX: TOTAL KNEE ARTHROPLASTY: SHX125

## 2012-02-05 LAB — TYPE AND SCREEN: Antibody Screen: NEGATIVE

## 2012-02-05 SURGERY — ARTHROPLASTY, KNEE, TOTAL
Anesthesia: Spinal | Site: Knee | Laterality: Right | Wound class: Clean

## 2012-02-05 MED ORDER — OXYCODONE HCL 5 MG PO TABS
5.0000 mg | ORAL_TABLET | ORAL | Status: DC | PRN
  Administered 2012-02-05 (×2): 10 mg via ORAL
  Filled 2012-02-05 (×2): qty 2

## 2012-02-05 MED ORDER — PENTOSAN POLYSULFATE SODIUM 100 MG PO CAPS: 200.0000 mg | ORAL_CAPSULE | Freq: Two times a day (BID) | ORAL | Status: DC

## 2012-02-05 MED ORDER — ONDANSETRON HCL 4 MG/2ML IJ SOLN: 4.0000 mg | Freq: Four times a day (QID) | INTRAMUSCULAR | Status: DC | PRN

## 2012-02-05 MED ORDER — PROPOFOL 10 MG/ML IV BOLUS
INTRAVENOUS | Status: DC | PRN
  Administered 2012-02-05: 20 mg via INTRAVENOUS

## 2012-02-05 MED ORDER — PHENOL 1.4 % MT LIQD: 1.0000 | OROMUCOSAL | Status: DC | PRN

## 2012-02-05 MED ORDER — BUPIVACAINE IN DEXTROSE 0.75-8.25 % IT SOLN
INTRATHECAL | Status: DC | PRN
  Administered 2012-02-05: 1.6 mL via INTRATHECAL

## 2012-02-05 MED ORDER — LORAZEPAM 0.5 MG PO TABS
0.5000 mg | ORAL_TABLET | Freq: Every day | ORAL | Status: DC
  Administered 2012-02-05 – 2012-02-07 (×3): 0.5 mg via ORAL
  Filled 2012-02-05 (×3): qty 1

## 2012-02-05 MED ORDER — BUPIVACAINE ON-Q PAIN PUMP (FOR ORDER SET NO CHG)
INJECTION | Status: DC
  Filled 2012-02-05: qty 1

## 2012-02-05 MED ORDER — SODIUM CHLORIDE 0.9 % IV SOLN
10.0000 mg | INTRAVENOUS | Status: DC | PRN
  Administered 2012-02-05: 50 ug/min via INTRAVENOUS

## 2012-02-05 MED ORDER — VENLAFAXINE HCL ER 75 MG PO CP24
75.0000 mg | ORAL_CAPSULE | Freq: Every day | ORAL | Status: DC
  Administered 2012-02-05 – 2012-02-07 (×3): 75 mg via ORAL
  Filled 2012-02-05 (×4): qty 1

## 2012-02-05 MED ORDER — LACTATED RINGERS IV SOLN: INTRAVENOUS | Status: DC

## 2012-02-05 MED ORDER — PSYLLIUM 95 % PO PACK
1.0000 | PACK | Freq: Every day | ORAL | Status: DC
  Administered 2012-02-05 – 2012-02-08 (×4): 1 via ORAL
  Filled 2012-02-05 (×4): qty 1

## 2012-02-05 MED ORDER — PHENYLEPHRINE HCL 10 MG/ML IJ SOLN
INTRAMUSCULAR | Status: DC | PRN
  Administered 2012-02-05 (×2): 80 ug via INTRAVENOUS

## 2012-02-05 MED ORDER — ACETAMINOPHEN 650 MG RE SUPP: 650.0000 mg | Freq: Four times a day (QID) | RECTAL | Status: DC | PRN

## 2012-02-05 MED ORDER — CEFAZOLIN SODIUM 1-5 GM-% IV SOLN
1.0000 g | Freq: Four times a day (QID) | INTRAVENOUS | Status: AC
  Administered 2012-02-05 – 2012-02-06 (×2): 1 g via INTRAVENOUS
  Filled 2012-02-05 (×3): qty 50

## 2012-02-05 MED ORDER — LACTATED RINGERS IV SOLN
INTRAVENOUS | Status: DC | PRN
  Administered 2012-02-05 (×2): via INTRAVENOUS

## 2012-02-05 MED ORDER — HYDROMORPHONE HCL PF 1 MG/ML IJ SOLN
1.0000 mg | INTRAMUSCULAR | Status: DC | PRN
  Administered 2012-02-05 – 2012-02-07 (×5): 1 mg via INTRAVENOUS
  Filled 2012-02-05 (×4): qty 1

## 2012-02-05 MED ORDER — HYDROCHLOROTHIAZIDE 25 MG PO TABS
25.0000 mg | ORAL_TABLET | Freq: Every morning | ORAL | Status: DC
  Administered 2012-02-06 – 2012-02-08 (×2): 25 mg via ORAL
  Filled 2012-02-05 (×3): qty 1

## 2012-02-05 MED ORDER — DOCUSATE SODIUM 100 MG PO CAPS
100.0000 mg | ORAL_CAPSULE | Freq: Two times a day (BID) | ORAL | Status: DC
  Administered 2012-02-05 – 2012-02-08 (×6): 100 mg via ORAL

## 2012-02-05 MED ORDER — FLEET ENEMA 7-19 GM/118ML RE ENEM: 1.0000 | ENEMA | Freq: Once | RECTAL | Status: AC | PRN

## 2012-02-05 MED ORDER — SODIUM CHLORIDE 0.9 % IV SOLN: INTRAVENOUS | Status: DC

## 2012-02-05 MED ORDER — MORPHINE SULFATE 2 MG/ML IJ SOLN
1.0000 mg | INTRAMUSCULAR | Status: DC | PRN
  Administered 2012-02-05 (×3): 2 mg via INTRAVENOUS
  Filled 2012-02-05 (×3): qty 1

## 2012-02-05 MED ORDER — DEXAMETHASONE SODIUM PHOSPHATE 4 MG/ML IJ SOLN
INTRAMUSCULAR | Status: DC | PRN
  Administered 2012-02-05: 10 mg via INTRAVENOUS

## 2012-02-05 MED ORDER — HYDROXYZINE HCL 25 MG PO TABS
25.0000 mg | ORAL_TABLET | Freq: Every day | ORAL | Status: DC
  Administered 2012-02-05 – 2012-02-07 (×3): 25 mg via ORAL
  Filled 2012-02-05 (×4): qty 1

## 2012-02-05 MED ORDER — ACETAMINOPHEN 325 MG PO TABS: 650.0000 mg | ORAL_TABLET | Freq: Four times a day (QID) | ORAL | Status: DC | PRN

## 2012-02-05 MED ORDER — SODIUM CHLORIDE 0.9 % IR SOLN
Status: DC | PRN
  Administered 2012-02-05: 1000 mL

## 2012-02-05 MED ORDER — ONDANSETRON HCL 4 MG/2ML IJ SOLN
INTRAMUSCULAR | Status: DC | PRN
  Administered 2012-02-05: 4 mg via INTRAVENOUS

## 2012-02-05 MED ORDER — MIDAZOLAM HCL 5 MG/5ML IJ SOLN
INTRAMUSCULAR | Status: DC | PRN
  Administered 2012-02-05 (×2): 1 mg via INTRAVENOUS

## 2012-02-05 MED ORDER — ONDANSETRON HCL 4 MG PO TABS: 4.0000 mg | ORAL_TABLET | Freq: Four times a day (QID) | ORAL | Status: DC | PRN

## 2012-02-05 MED ORDER — SUCRALFATE 1 G PO TABS
1.0000 g | ORAL_TABLET | Freq: Every day | ORAL | Status: DC | PRN
  Filled 2012-02-05: qty 1

## 2012-02-05 MED ORDER — TRAMADOL HCL 50 MG PO TABS: 50.0000 mg | ORAL_TABLET | Freq: Four times a day (QID) | ORAL | Status: DC | PRN

## 2012-02-05 MED ORDER — METHOCARBAMOL 100 MG/ML IJ SOLN
500.0000 mg | Freq: Four times a day (QID) | INTRAVENOUS | Status: DC | PRN
  Administered 2012-02-05: 500 mg via INTRAVENOUS
  Filled 2012-02-05: qty 5

## 2012-02-05 MED ORDER — ACETAMINOPHEN 10 MG/ML IV SOLN
1000.0000 mg | Freq: Once | INTRAVENOUS | Status: DC
Start: 2012-02-05 — End: 2012-02-05

## 2012-02-05 MED ORDER — RIVAROXABAN 10 MG PO TABS
10.0000 mg | ORAL_TABLET | Freq: Every day | ORAL | Status: DC
  Administered 2012-02-06 – 2012-02-07 (×2): 10 mg via ORAL
  Filled 2012-02-05 (×4): qty 1

## 2012-02-05 MED ORDER — FENTANYL CITRATE 0.05 MG/ML IJ SOLN
INTRAMUSCULAR | Status: DC | PRN
  Administered 2012-02-05 (×2): 50 ug via INTRAVENOUS

## 2012-02-05 MED ORDER — HYDROMORPHONE HCL PF 1 MG/ML IJ SOLN: 0.2500 mg | INTRAMUSCULAR | Status: DC | PRN

## 2012-02-05 MED ORDER — GABAPENTIN 300 MG PO CAPS
300.0000 mg | ORAL_CAPSULE | Freq: Every day | ORAL | Status: DC
  Administered 2012-02-05 – 2012-02-07 (×3): 300 mg via ORAL
  Filled 2012-02-05 (×4): qty 1

## 2012-02-05 MED ORDER — PROPOFOL INFUSION 10 MG/ML OPTIME
INTRAVENOUS | Status: DC | PRN
  Administered 2012-02-05: 140 ug/kg/min via INTRAVENOUS

## 2012-02-05 MED ORDER — ACETAMINOPHEN 10 MG/ML IV SOLN
INTRAVENOUS | Status: DC | PRN
  Administered 2012-02-05: 1000 mg via INTRAVENOUS

## 2012-02-05 MED ORDER — HYDROMORPHONE HCL PF 1 MG/ML IJ SOLN
INTRAMUSCULAR | Status: AC
  Filled 2012-02-05: qty 1

## 2012-02-05 MED ORDER — BISACODYL 10 MG RE SUPP: 10.0000 mg | Freq: Every day | RECTAL | Status: DC | PRN

## 2012-02-05 MED ORDER — STERILE WATER FOR IRRIGATION IR SOLN
Status: DC | PRN
  Administered 2012-02-05: 3000 mL

## 2012-02-05 MED ORDER — IRBESARTAN 300 MG PO TABS
300.0000 mg | ORAL_TABLET | Freq: Every day | ORAL | Status: DC
  Administered 2012-02-05 – 2012-02-06 (×2): 300 mg via ORAL
  Filled 2012-02-05 (×4): qty 1

## 2012-02-05 MED ORDER — METHOCARBAMOL 500 MG PO TABS
500.0000 mg | ORAL_TABLET | Freq: Four times a day (QID) | ORAL | Status: DC | PRN
  Administered 2012-02-05 – 2012-02-08 (×5): 500 mg via ORAL
  Filled 2012-02-05 (×5): qty 1

## 2012-02-05 MED ORDER — METOCLOPRAMIDE HCL 10 MG PO TABS: 5.0000 mg | ORAL_TABLET | Freq: Three times a day (TID) | ORAL | Status: DC | PRN

## 2012-02-05 MED ORDER — DEXTROSE-NACL 5-0.45 % IV SOLN
INTRAVENOUS | Status: DC
  Administered 2012-02-05 – 2012-02-06 (×3): via INTRAVENOUS

## 2012-02-05 MED ORDER — POLYETHYLENE GLYCOL 3350 17 G PO PACK: 17.0000 g | PACK | Freq: Every day | ORAL | Status: DC | PRN

## 2012-02-05 MED ORDER — MENTHOL 3 MG MT LOZG: 1.0000 | LOZENGE | OROMUCOSAL | Status: DC | PRN

## 2012-02-05 MED ORDER — DEXAMETHASONE 6 MG PO TABS
10.0000 mg | ORAL_TABLET | Freq: Once | ORAL | Status: AC
  Administered 2012-02-06: 10 mg via ORAL
  Filled 2012-02-05: qty 1

## 2012-02-05 MED ORDER — BUPIVACAINE 0.25 % ON-Q PUMP SINGLE CATH 300ML
INJECTION | Status: DC | PRN
  Administered 2012-02-05: 300 mL

## 2012-02-05 MED ORDER — GUAIFENESIN ER 600 MG PO TB12
600.0000 mg | ORAL_TABLET | Freq: Two times a day (BID) | ORAL | Status: DC | PRN
  Filled 2012-02-05: qty 1

## 2012-02-05 MED ORDER — ATORVASTATIN CALCIUM 40 MG PO TABS
40.0000 mg | ORAL_TABLET | Freq: Every day | ORAL | Status: DC
  Administered 2012-02-05 – 2012-02-07 (×3): 40 mg via ORAL
  Filled 2012-02-05 (×4): qty 1

## 2012-02-05 MED ORDER — OXYCODONE HCL 5 MG PO TABS
15.0000 mg | ORAL_TABLET | ORAL | Status: DC | PRN
  Administered 2012-02-05 – 2012-02-06 (×6): 15 mg via ORAL
  Administered 2012-02-07: 5 mg via ORAL
  Administered 2012-02-07 (×3): 10 mg via ORAL
  Administered 2012-02-08: 5 mg via ORAL
  Administered 2012-02-08: 10 mg via ORAL
  Filled 2012-02-05: qty 2
  Filled 2012-02-05: qty 3
  Filled 2012-02-05: qty 2
  Filled 2012-02-05 (×3): qty 3
  Filled 2012-02-05: qty 2
  Filled 2012-02-05 (×2): qty 3
  Filled 2012-02-05: qty 2
  Filled 2012-02-05: qty 3
  Filled 2012-02-05: qty 1

## 2012-02-05 MED ORDER — DEXAMETHASONE SODIUM PHOSPHATE 10 MG/ML IJ SOLN: 10.0000 mg | Freq: Once | INTRAMUSCULAR | Status: AC

## 2012-02-05 MED ORDER — ACETAMINOPHEN 10 MG/ML IV SOLN
1000.0000 mg | Freq: Four times a day (QID) | INTRAVENOUS | Status: AC
  Administered 2012-02-05 – 2012-02-06 (×4): 1000 mg via INTRAVENOUS
  Filled 2012-02-05 (×6): qty 100

## 2012-02-05 MED ORDER — PANTOPRAZOLE SODIUM 40 MG PO TBEC
80.0000 mg | DELAYED_RELEASE_TABLET | Freq: Every day | ORAL | Status: DC
  Filled 2012-02-05 (×2): qty 2

## 2012-02-05 MED ORDER — PROMETHAZINE HCL 25 MG/ML IJ SOLN: 6.2500 mg | INTRAMUSCULAR | Status: DC | PRN

## 2012-02-05 MED ORDER — CARVEDILOL 6.25 MG PO TABS
6.2500 mg | ORAL_TABLET | Freq: Two times a day (BID) | ORAL | Status: DC
  Administered 2012-02-05 – 2012-02-08 (×6): 6.25 mg via ORAL
  Filled 2012-02-05 (×7): qty 1

## 2012-02-05 MED ORDER — DIPHENHYDRAMINE HCL 12.5 MG/5ML PO ELIX
12.5000 mg | ORAL_SOLUTION | ORAL | Status: DC | PRN
  Administered 2012-02-07 (×3): 12.5 mg via ORAL
  Filled 2012-02-05 (×3): qty 5

## 2012-02-05 MED ORDER — 0.9 % SODIUM CHLORIDE (POUR BTL) OPTIME
TOPICAL | Status: DC | PRN
  Administered 2012-02-05: 1000 mL

## 2012-02-05 MED ORDER — METOCLOPRAMIDE HCL 5 MG/ML IJ SOLN: 5.0000 mg | Freq: Three times a day (TID) | INTRAMUSCULAR | Status: DC | PRN

## 2012-02-05 SURGICAL SUPPLY — 55 items
BAG SPEC THK2 15X12 ZIP CLS (MISCELLANEOUS) ×1
BAG ZIPLOCK 12X15 (MISCELLANEOUS) ×2 IMPLANT
BANDAGE ELASTIC 6 VELCRO ST LF (GAUZE/BANDAGES/DRESSINGS) ×2 IMPLANT
BANDAGE ESMARK 6X9 LF (GAUZE/BANDAGES/DRESSINGS) ×1 IMPLANT
BLADE SAG 18X100X1.27 (BLADE) ×2 IMPLANT
BLADE SAW SGTL 11.0X1.19X90.0M (BLADE) ×2 IMPLANT
BNDG CMPR 9X6 STRL LF SNTH (GAUZE/BANDAGES/DRESSINGS) ×1
BNDG ESMARK 6X9 LF (GAUZE/BANDAGES/DRESSINGS) ×2
BOWL SMART MIX CTS (DISPOSABLE) ×2 IMPLANT
CATH KIT ON-Q SILVERSOAK 5 (CATHETERS) ×1 IMPLANT
CATH KIT ON-Q SILVERSOAK 5IN (CATHETERS) ×2 IMPLANT
CEMENT HV SMART SET (Cement) ×4 IMPLANT
CLOTH BEACON ORANGE TIMEOUT ST (SAFETY) ×2 IMPLANT
CLSR STERI-STRIP ANTIMIC 1/2X4 (GAUZE/BANDAGES/DRESSINGS) ×1 IMPLANT
CUFF TOURN SGL QUICK 34 (TOURNIQUET CUFF)
CUFF TOURN SGL QUICK 44 (TOURNIQUET CUFF) ×1 IMPLANT
CUFF TRNQT CYL 34X4X40X1 (TOURNIQUET CUFF) ×1 IMPLANT
DRAPE EXTREMITY T 121X128X90 (DRAPE) ×2 IMPLANT
DRAPE POUCH INSTRU U-SHP 10X18 (DRAPES) ×2 IMPLANT
DRAPE U-SHAPE 47X51 STRL (DRAPES) ×2 IMPLANT
DRSG ADAPTIC 3X8 NADH LF (GAUZE/BANDAGES/DRESSINGS) ×2 IMPLANT
DURAPREP 26ML APPLICATOR (WOUND CARE) ×2 IMPLANT
ELECT REM PT RETURN 9FT ADLT (ELECTROSURGICAL) ×2
ELECTRODE REM PT RTRN 9FT ADLT (ELECTROSURGICAL) ×1 IMPLANT
EVACUATOR 1/8 PVC DRAIN (DRAIN) ×2 IMPLANT
FACESHIELD LNG OPTICON STERILE (SAFETY) ×10 IMPLANT
GLOVE BIO SURGEON STRL SZ8 (GLOVE) ×2 IMPLANT
GLOVE BIOGEL PI IND STRL 8 (GLOVE) ×2 IMPLANT
GLOVE BIOGEL PI INDICATOR 8 (GLOVE) ×2
GLOVE ECLIPSE 8.0 STRL XLNG CF (GLOVE) ×2 IMPLANT
GLOVE SURG SS PI 6.5 STRL IVOR (GLOVE) ×4 IMPLANT
GOWN STRL NON-REIN LRG LVL3 (GOWN DISPOSABLE) ×4 IMPLANT
GOWN STRL REIN XL XLG (GOWN DISPOSABLE) ×2 IMPLANT
HANDPIECE INTERPULSE COAX TIP (DISPOSABLE) ×2
IMMOBILIZER KNEE 20 (SOFTGOODS) ×2
IMMOBILIZER KNEE 20 THIGH 36 (SOFTGOODS) ×1 IMPLANT
KIT BASIN OR (CUSTOM PROCEDURE TRAY) ×2 IMPLANT
MANIFOLD NEPTUNE II (INSTRUMENTS) ×2 IMPLANT
NS IRRIG 1000ML POUR BTL (IV SOLUTION) ×2 IMPLANT
PACK TOTAL JOINT (CUSTOM PROCEDURE TRAY) ×2 IMPLANT
PAD ABD 7.5X8 STRL (GAUZE/BANDAGES/DRESSINGS) ×2 IMPLANT
PADDING CAST COTTON 6X4 STRL (CAST SUPPLIES) ×6 IMPLANT
POSITIONER SURGICAL ARM (MISCELLANEOUS) ×2 IMPLANT
SET HNDPC FAN SPRY TIP SCT (DISPOSABLE) ×1 IMPLANT
SPONGE GAUZE 4X4 12PLY (GAUZE/BANDAGES/DRESSINGS) ×2 IMPLANT
STRIP CLOSURE SKIN 1/2X4 (GAUZE/BANDAGES/DRESSINGS) ×4 IMPLANT
SUCTION FRAZIER 12FR DISP (SUCTIONS) ×2 IMPLANT
SUT MNCRL AB 4-0 PS2 18 (SUTURE) ×2 IMPLANT
SUT VIC AB 2-0 CT1 27 (SUTURE) ×6
SUT VIC AB 2-0 CT1 TAPERPNT 27 (SUTURE) ×3 IMPLANT
SUT VLOC 180 0 24IN GS25 (SUTURE) ×2 IMPLANT
TOWEL OR 17X26 10 PK STRL BLUE (TOWEL DISPOSABLE) ×4 IMPLANT
TRAY FOLEY CATH 14FRSI W/METER (CATHETERS) ×2 IMPLANT
WATER STERILE IRR 1500ML POUR (IV SOLUTION) ×2 IMPLANT
WRAP KNEE MAXI GEL POST OP (GAUZE/BANDAGES/DRESSINGS) ×4 IMPLANT

## 2012-02-05 NOTE — Interval H&P Note (Signed)
History and Physical Interval Note:  02/05/2012 11:51 AM  Shannon [name]  has presented today for surgery, with the diagnosis of osteoarthritis right knee  The various methods of treatment have been discussed with the patient and family. After consideration of risks, benefits and other options for treatment, the patient has consented to  Procedure(s) (LRB) with comments: TOTAL KNEE ARTHROPLASTY (Right) as a surgical intervention .  The patient's history has been reviewed, patient examined, no change in status, stable for surgery.  I have reviewed the patient's chart and labs.  Questions were answered to the patient's satisfaction.     Loanne Drilling

## 2012-02-05 NOTE — H&P (View-Only) (Signed)
Shannon [name]  DOB: [birth year] Married / Language: English / Race: White Female  Date of Admission:  02/05/2012  Chief Complaint:  Right Knee Pain  History of Present Illness The patient is a 67 year old female who comes in for a preoperative History and Physical. The patient is scheduled for a right total knee arthroplasty to be performed by Dr. Gus Rankin. Aluisio, MD at [facility] on 02/05/2012. The patient is a 67 year old female who presents today for follow up of their knee. The patient is being followed for their right knee pain and osteoarthritis. Symptoms reported today include: pain. and report their pain level to be 8 / 10 (She is also wanting a cortisone injection today.). The following medication has been used for pain control: Advil. Her knee xrays were updated. The right knee has gotten progressively worse. She has severe erosive patellofemoral arthritis. She has some medial compartment involvement also, but patellofemoral is the worse. She has eroded away about a third of her patella and has a flattening of the trochlear. The pain has gotten to the point where it is hurting her at all times. The injections didn't help her at all. She is at a stage now where she wants to get this fixed. Cortisone and Visco supplements have not helped. She is ready to get the knee fixed. They have been treated conservatively in the past for the above stated problem and despite conservative measures, they continue to have progressive pain and severe functional limitations and dysfunction. They have failed non-operative management including home exercise, medications, and injections. It is felt that they would benefit from undergoing total joint replacement. Risks and benefits of the procedure have been discussed with the patient and they elect to proceed with surgery. There are no active contraindications to surgery such as ongoing infection or rapidly progressive neurological  disease.  Problem List Primary osteoarthritis of one knee, right (715.16)  Allergies No Known Drug Allergies  Family History Heart Disease. father, grandmother mothers side and grandfather mothers side Osteoporosis. mother Osteoarthritis. father Cancer. mother Congestive Heart Failure. father Hypertension. mother  Social History Drug/Alcohol Rehab (Currently). no Tobacco / smoke exposure. no Current work status. working part time Marital status. married Number of flights of stairs before winded. 2-3 Tobacco use. never smoker Exercise. Exercises rarely Illicit drug use. no Alcohol use. current drinker; only occasionally per week Children. 3 Living situation. live with spouse Pain Contract. no Copy of Drug/Alcohol Rehab (Previously). no Post-Surgical Plans. Plan is for home. Advance Directives. Living Will, Healthcare POA  Medication History Elmiron (100MG  Capsule, Oral) Active. Crestor ( Oral) Specific dose unknown - Active. Gabapentin ( Oral) Specific dose unknown - Active. LORazepam ( Oral) Specific dose unknown - Active. Venlafaxine HCl ( Oral) Specific dose unknown - Active. NexIUM ( Oral) Specific dose unknown - Active. HydrOXYzine HCl ( Oral) Specific dose unknown - Active. Carvedilol ( Oral) Specific dose unknown - Active. Micardis ( Oral) Specific dose unknown - Active. Aspirin Childrens (81MG  Tablet Chewable, Oral) Active. Multivitamin ( Oral) Active. Fish Oil Active. Vitamin B Complex-8 ( Oral) Specific dose unknown - Active. Vitamin E Complex ( Oral) Specific dose unknown - Active. Vitamin D ( Oral) Specific dose unknown - Active. Glucosamine Chondroitin Complx ( Oral) Active.  Pregnancy / Birth History Pregnant. no  Past Surgical History Breast Mass; Local Excision. Date: 2011. right Tubal Ligation. Date: 63. Tonsillectomy Cataract Surgery. right Carpal Tunnel Repair. left Colon Polyp Removal -  Colonoscopy Gallbladder Surgery. laporoscopic  Excision of Pituitary Mass. Date: 2000.  Medical History Peripheral Neuropathy Osteoarthritis Osteoporosis Gastroesophageal Reflux Disease Breast Cancer. Right Sided Migraine Headache Skin Cancer. Melanoma High blood pressure Asthma Hypercholesterolemia Chronic Cystitis Hiatal Hernia Impaired Vision Menopause  Review of Systems General:Not Present- Chills, Fever, Night Sweats, Fatigue, Weight Gain, Weight Loss and Memory Loss. Skin:Not Present- Hives, Itching, Rash, Eczema and Lesions. HEENT:Not Present- Tinnitus, Headache, Double Vision, Visual Loss, Hearing Loss and Dentures. Respiratory:Present- Shortness of breath with exertion. Not Present- Shortness of breath at rest, Allergies, Coughing up blood and Chronic Cough. Cardiovascular:Not Present- Chest Pain, Racing/skipping heartbeats, Difficulty Breathing Lying Down, Murmur, Swelling and Palpitations. Gastrointestinal:Present- Heartburn. Not Present- Bloody Stool, Abdominal Pain, Vomiting, Nausea, Constipation, Diarrhea, Difficulty Swallowing, Jaundice and Loss of appetitie. Female Genitourinary:Not Present- Blood in Urine, Urinary frequency, Weak urinary stream, Discharge, Flank Pain, Incontinence, Painful Urination, Urgency, Urinary Retention and Urinating at Night. Musculoskeletal:Present- Joint Pain, Back Pain, Morning Stiffness and Spasms. Not Present- Muscle Weakness, Muscle Pain and Joint Swelling. Neurological:Not Present- Tremor, Dizziness, Blackout spells, Paralysis, Difficulty with balance and Weakness. Psychiatric:Not Present- Insomnia.  Vitals Weight: 208 lb Height: 64 in Body Surface Area: 2.06 m Body Mass Index: 35.7 kg/m Pulse: 84 (Regular) Resp.: 14 (Unlabored) BP: 126/80 (Sitting, Right Arm, Standard)  Physical Exam The physical exam findings are as follows:  Note: Patient is a 67 year old female with continued knee  pain.  General Mental Status - Alert, cooperative and good historian. General Appearance- pleasant. Not in acute distress. Orientation- Oriented X3. Build & Nutrition- Overweight, Well nourished and Well developed.   Head and Neck Head- normocephalic, atraumatic . Neck Global Assessment- supple. no bruit auscultated on the right and no bruit auscultated on the left.   Eye Pupil- Bilateral- Regular and Round. Note: wears galsses Motion- Bilateral- EOMI. 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  Chest and Lung Exam Auscultation: Breath sounds:- clear at anterior chest wall and - clear at posterior chest wall. Adventitious sounds:- No Adventitious sounds.   Cardiovascular Auscultation:Rhythm- Regular rate and rhythm. Heart Sounds- S1 WNL and S2 WNL. Murmurs & Other Heart Sounds:Auscultation of the heart reveals - No Murmurs.   Abdomen Inspection:Contour- Generalized mild distention. Palpation/Percussion:Tenderness- Abdomen is non-tender to palpation. Rigidity (guarding)- Abdomen is soft. Auscultation:Auscultation of the abdomen reveals - Bowel sounds normal.   Female Genitourinary  Not done, not pertinent to present illness  Musculoskeletal Well developed female, alert and oriented in no apparent distress. Her right knee shows no effusion. There is marked crepitus on range of motion. Range is 5 to 125. There is no instability.  RADIOGRAPHS: The radiographs with her again demonstrating the pertinent pathology, she has advanced patellofemoral arthritis, bone on bone.  Assessment & Plan Primary osteoarthritis of one knee, right (715.16)  Note: Plan is for a right total knee replacement by Dr. Lequita Halt.  Plan is to go home after the hospital stay.  PCP - Dr. Reuel Derby Cards - Dr. Daleen Squibb Urology - Dr. Marcelyn Bruins Oncology - Dr. Darnelle Catalan GI - Dr. Arlyce Dice  Signed electronically by Roberts Gaudy,  PA-C

## 2012-02-05 NOTE — Transfer of Care (Signed)
Immediate Anesthesia Transfer of Care Note  Patient: Shannon [name]  Procedure(s) Performed: Procedure(s) (LRB) with comments: TOTAL KNEE ARTHROPLASTY (Right)  Patient Location: PACU  Anesthesia Type:Spinal  Level of Consciousness: awake, alert  and oriented  Airway & Oxygen Therapy: Patient Spontanous Breathing and Patient connected to nasal cannula oxygen  Post-op Assessment: Report given to PACU RN and Post -op Vital signs reviewed and stable  Post vital signs: Reviewed and stable  Complications: No apparent anesthesia complications

## 2012-02-05 NOTE — Anesthesia Preprocedure Evaluation (Signed)
Anesthesia Evaluation  Patient identified by MRN, date of birth, ID band Patient awake    Reviewed: Allergy & Precautions, H&P , NPO status , Patient's Chart, lab work & pertinent test results  Airway Mallampati: II TM Distance: >3 FB Neck ROM: Full    Dental No notable dental hx.    Pulmonary asthma ,  breath sounds clear to auscultation  Pulmonary exam normal       Cardiovascular hypertension, Pt. on medications Rhythm:Regular Rate:Normal     Neuro/Psych negative neurological ROS  negative psych ROS   GI/Hepatic Neg liver ROS, GERD-  Medicated,  Endo/Other  negative endocrine ROSMorbid obesity  Renal/GU negative Renal ROS  negative genitourinary   Musculoskeletal negative musculoskeletal ROS (+)   Abdominal   Peds negative pediatric ROS (+)  Hematology negative hematology ROS (+)   Anesthesia Other Findings   Reproductive/Obstetrics negative OB ROS                           Anesthesia Physical Anesthesia Plan  ASA: III  Anesthesia Plan: Spinal   Post-op Pain Management:    Induction:   Airway Management Planned: Simple Face Mask  Additional Equipment:   Intra-op Plan:   Post-operative Plan:   Informed Consent: I have reviewed the patients History and Physical, chart, labs and discussed the procedure including the risks, benefits and alternatives for the proposed anesthesia with the patient or authorized representative who has indicated his/her understanding and acceptance.     Plan Discussed with: CRNA and Surgeon  Anesthesia Plan Comments:         Anesthesia Quick Evaluation

## 2012-02-05 NOTE — Progress Notes (Signed)
Utilization review completed.  

## 2012-02-05 NOTE — Anesthesia Procedure Notes (Signed)
Spinal  Patient location during procedure: OR Staffing Performed by: anesthesiologist  Preanesthetic Checklist Completed: patient identified, site marked, surgical consent, pre-op evaluation, timeout performed, IV checked, risks and benefits discussed and monitors and equipment checked Spinal Block Patient position: sitting Prep: Betadine Patient monitoring: heart rate, continuous pulse ox and blood pressure Injection technique: single-shot Needle Needle type: Quincke  Needle gauge: 22 G Needle length: 9 cm Additional Notes Expiration date of kit checked and confirmed. Patient tolerated procedure well, without complications.     

## 2012-02-05 NOTE — Anesthesia Postprocedure Evaluation (Signed)
  Anesthesia Post-op Note  Patient: Shannon [name]  Procedure(s) Performed: Procedure(s) (LRB): TOTAL KNEE ARTHROPLASTY (Right)  Patient Location: PACU  Anesthesia Type: Spinal  Level of Consciousness: awake and alert   Airway and Oxygen Therapy: Patient Spontanous Breathing  Post-op Pain: mild  Post-op Assessment: Post-op Vital signs reviewed, Patient's Cardiovascular Status Stable, Respiratory Function Stable, Patent Airway and No signs of Nausea or vomiting  Last Vitals:  Filed Vitals:   02/05/12 1520  BP: 105/58  Pulse: 58  Temp:   Resp: 13    Post-op Vital Signs: stable   Complications: No apparent anesthesia complications

## 2012-02-05 NOTE — Preoperative (Signed)
Beta Blockers   Reason not to administer Beta Blockers:Not Applicable 

## 2012-02-05 NOTE — Op Note (Signed)
Pre-operative diagnosis- Osteoarthritis  Right knee(s)  Post-operative diagnosis- Osteoarthritis Right knee(s)  Procedure-  Right  Total Knee Arthroplasty  Surgeon- Gus Rankin. Anita Laguna, MD  Assistant- Dimitri Ped, PA-C   Anesthesia-  Spinal EBL-* No blood loss amount entered *  Drains Hemovac  Tourniquet time-  Total Tourniquet Time Documented: Thigh (Left) - 34 minutes   Complications- None  Condition-PACU - hemodynamically stable.   Brief Clinical Note  Shannon [name] is a 67 y.o. year old female with end stage OA of her right knee with progressively worsening pain and dysfunction. She has constant pain, with activity and at rest and significant functional deficits with difficulties even with ADLs. She has had extensive non-op management including analgesics, injections of cortisone and viscosupplements, and home exercise program, but remains in significant pain with significant dysfunction.Radiographs show bone on bone arthritis patellofemoral with medial joint space narrowing. She presents now for right Total Knee Arthroplasty.    Procedure in detail---   The patient is brought into the operating room and positioned supine on the operating table. After successful administration of  Spinal,   a tourniquet is placed high on the  Right thigh(s) and the lower extremity is prepped and draped in the usual sterile fashion. Time out is performed by the operating team and then the  Right lower extremity is wrapped in Esmarch, knee flexed and the tourniquet inflated to 300 mmHg.       A midline incision is made with a ten blade through the subcutaneous tissue to the level of the extensor mechanism. A fresh blade is used to make a medial parapatellar arthrotomy. Soft tissue over the proximal medial tibia is subperiosteally elevated to the joint line with a knife and into the semimembranosus bursa with a Cobb elevator. Soft tissue over the proximal lateral tibia is elevated with attention being  paid to avoiding the patellar tendon on the tibial tubercle. The patella is everted, knee flexed 90 degrees and the ACL and PCL are removed. Findings are bone on bone patellofemoral with large patellofemoral osteophytes and areas of exposed bone on the medial femoral condyle.        The drill is used to create a starting hole in the distal femur and the canal is thoroughly irrigated with sterile saline to remove the fatty contents. The 5 degree Right  valgus alignment guide is placed into the femoral canal and the distal femoral cutting block is pinned to remove 10 mm off the distal femur. Resection is made with an oscillating saw.      The tibia is subluxed forward and the menisci are removed. The extramedullary alignment guide is placed referencing proximally at the medial aspect of the tibial tubercle and distally along the second metatarsal axis and tibial crest. The block is pinned to remove 2mm off the more deficient medial  side. Resection is made with an oscillating saw. Size 2.5is the most appropriate size for the tibia and the proximal tibia is prepared with the modular drill and keel punch for that size.      The femoral sizing guide is placed and size 2.5 is most appropriate. Rotation is marked off the epicondylar axis and confirmed by creating a rectangular flexion gap at 90 degrees. The size 2.5 cutting block is pinned in this rotation and the anterior, posterior and chamfer cuts are made with the oscillating saw. The intercondylar block is then placed and that cut is made.      Trial size 2.5 tibial component,  trial size 2.5 posterior stabilized femur and a 10  mm posterior stabilized rotating platform insert trial is placed. Full extension is achieved with excellent varus/valgus and anterior/posterior balance throughout full range of motion. The patella is everted and thickness measured to be 21  mm. Free hand resection is taken to 12 mm, a 35 template is placed, lug holes are drilled, trial  patella is placed, and it tracks normally. Osteophytes are removed off the posterior femur with the trial in place. All trials are removed and the cut bone surfaces prepared with pulsatile lavage. Cement is mixed and once ready for implantation, the size 2.5 tibial implant, size  2.5 posterior stabilized femoral component, and the size 35 patella are cemented in place and the patella is held with the clamp. The trial insert is placed and the knee held in full extension. All extruded cement is removed and once the cement is hard the permanent 10 mm posterior stabilized rotating platform insert is placed into the tibial tray.      The wound is copiously irrigated with saline solution and the extensor mechanism closed over a hemovac drain with #1 PDS suture. The tourniquet is released for a total tourniquet time of 33  minutes. Flexion against gravity is 140 degrees and the patella tracks normally. Subcutaneous tissue is closed with 2.0 vicryl and subcuticular with running 4.0 Monocryl. The catheter for the Marcaine pain pump is placed and the pump is initiated. The incision is cleaned and dried and steri-strips and a bulky sterile dressing are applied. The limb is placed into a knee immobilizer and the patient is awakened and transported to recovery in stable condition.      Please note that a surgical assistant was a medical necessity for this procedure in order to perform it in a safe and expeditious manner. Surgical assistant was necessary to retract the ligaments and vital neurovascular structures to prevent injury to them and also necessary for proper positioning of the limb to allow for anatomic placement of the prosthesis.   Gus Rankin Theia Dezeeuw, MD    02/05/2012, 1:46 PM

## 2012-02-06 LAB — CBC
MCH: 29.1 pg (ref 26.0–34.0)
MCHC: 32.8 g/dL (ref 30.0–36.0)
Platelets: 281 10*3/uL (ref 150–400)
RDW: 13.8 % (ref 11.5–15.5)

## 2012-02-06 LAB — BASIC METABOLIC PANEL
Calcium: 8.9 mg/dL (ref 8.4–10.5)
GFR calc Af Amer: 77 mL/min — ABNORMAL LOW (ref >90)
GFR calc non Af Amer: 66 mL/min — ABNORMAL LOW (ref >90)
Sodium: 134 mEq/L — ABNORMAL LOW (ref 135–145)

## 2012-02-06 MED ORDER — OXYCODONE HCL 15 MG PO TABS: 15.0000 mg | ORAL_TABLET | ORAL | Status: DC | PRN

## 2012-02-06 MED ORDER — NON FORMULARY: 40.0000 mg | Freq: Every morning | Status: DC

## 2012-02-06 MED ORDER — METHOCARBAMOL 500 MG PO TABS: 500.0000 mg | ORAL_TABLET | Freq: Four times a day (QID) | ORAL | Status: DC | PRN

## 2012-02-06 MED ORDER — RIVAROXABAN 10 MG PO TABS: 10.0000 mg | ORAL_TABLET | Freq: Every day | ORAL | Status: DC

## 2012-02-06 MED ORDER — OMEPRAZOLE 20 MG PO CPDR
40.0000 mg | DELAYED_RELEASE_CAPSULE | Freq: Every day | ORAL | Status: DC
  Administered 2012-02-06 – 2012-02-08 (×3): 40 mg via ORAL
  Filled 2012-02-06 (×3): qty 2

## 2012-02-06 NOTE — Progress Notes (Signed)
Subjective: 1 Day Post-Op Procedure(s) (LRB): TOTAL KNEE ARTHROPLASTY (Right) Patient reports pain as mild.   Patient seen in rounds with Dr. Lequita Halt. Patient is well, and has had no acute complaints or problems We will start therapy today.  Plan is to go Home after hospital stay.  Objective: Vital signs in last 24 hours: Temp:  [97.3 F (36.3 C)-98.7 F (37.1 C)] 98.3 F (36.8 C) (01/07 0532) Pulse Rate:  [56-85] 79  (01/07 0532) Resp:  [11-18] 14  (01/07 0532) BP: (82-141)/(49-92) 141/90 mmHg (01/07 0532) SpO2:  [94 %-100 %] 100 % (01/07 0532) FiO2 (%):  [100 %] 100 % (01/06 1537) Weight:  [96.616 kg (213 lb)] 96.616 kg (213 lb) (01/06 1537)  Intake/Output from previous day:  Intake/Output Summary (Last 24 hours) at 02/06/12 0803 Last data filed at 02/06/12 0552  Gross per 24 hour  Intake   3564 ml  Output   2360 ml  Net   1204 ml    Intake/Output this shift: UOP 1000 since MN +1204  Labs:  [facility] 02/06/12 0535  HGB 10.2*    Basename 02/06/12 0535  WBC 13.0*  RBC 3.51*  HCT 31.1*  PLT 281    Basename 02/06/12 0535  NA 134*  K 4.5  CL 99  CO2 28  BUN 13  CREATININE 0.88  GLUCOSE 163*  CALCIUM 8.9   No results found for this basename: LABPT:2,INR:2 in the last 72 hours  EXAM General - Patient is Alert, Appropriate and Oriented Extremity - Neurovascular intact Sensation intact distally Dorsiflexion/Plantar flexion intact Dressing - dressing C/D/I Motor Function - intact, moving foot and toes well on exam.  Hemovac pulled without difficulty.  Past Medical History  Diagnosis Date  . NICM (nonischemic cardiomyopathy) 03/08/2010    likely 2/2 chemotx - a. Echo 2012: EF 45-50%;  b. Lex MV 2/12:  low risk, apical defect (small area of ischemia vs shifting breast atten);  c.  Echo 7/12: Normal wall thickness, EF 60-65%, normal wall motion, grade 1 diastolic dysfunction, mild LAE, PASP 32;   d. Lex MV 11/13:  EF 76%, no ischemia  . GERD 08/11/2008    . HYPERLIPIDEMIA 08/11/2008  . HYPERTENSION 08/11/2008  . LIPOMA 02/10/2009  . PREDIABETES 06/15/2009  . 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 Carpal tunnel syndrome of left wrist   . Neuropathy   . Interstitial cystitis   . Brain tumor   . DEPRESSION 08/11/2008  . 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 Osteoporosis   . Hyponatremia 08/28/2011  . Anemia 08/28/2011  . History of chicken pox   . H/O measles   . H/O mumps   . Dermatitis   . OA (osteoarthritis) 09/07/2011  . Melanoma 2008    DR. Yetta Barre  . Shortness of breath   . Asthma   . History of blood transfusion     Assessment/Plan: 1 Day Post-Op Procedure(s) (LRB): TOTAL KNEE ARTHROPLASTY (Right) Principal Problem:  *OA (osteoarthritis) of knee Active Problems:  Hyponatremia  Anemia  Estimated Body mass index is 37.73 kg/(m^2) as calculated from the following:   Height as of this encounter: 5\' 3" (1.6 m).   Weight as of this encounter: 213 lb(96.616 kg). Advance diet Up with therapy Plan for discharge tomorrow Discharge home with home health  DVT Prophylaxis - Xarelto Weight-Bearing as tolerated to right leg No vaccines. D/C O2 and  Pulse OX and try on Room Air  Shannon [name] 02/06/2012, 8:03 AM

## 2012-02-06 NOTE — Progress Notes (Signed)
Found ON-Q pump catheter out. Tip intact.

## 2012-02-06 NOTE — Progress Notes (Signed)
Physical Therapy Treatment Patient Details Name: Shannon [name] MRN: [id number] DOB: 1945-02-25 Today's Date: 02/06/2012 Time: 4098-1191 PT Time Calculation (min): 20 min  PT Assessment / Plan / Recommendation Comments on Treatment Session       Follow Up Recommendations  Home health PT     Does the patient have the potential to tolerate intense rehabilitation     Barriers to Discharge        Equipment Recommendations  Rolling walker with 5" wheels    Recommendations for Other Services OT consult  Frequency 7X/week   Plan Discharge plan remains appropriate    Precautions / Restrictions Precautions Precautions: Knee;Fall Required Braces or Orthoses: Knee Immobilizer - Left Knee Immobilizer - Left: Discontinue once straight leg raise with < 10 degree lag Restrictions Weight Bearing Restrictions: No Other Position/Activity Restrictions: WBAT   Pertinent Vitals/Pain 6/10 with activity    Mobility  Bed Mobility Bed Mobility: Sit to Supine Sit to Supine: 4: Min assist;3: Mod assist Details for Bed Mobility Assistance: min cues for sequence and use of L LE to self assist Transfers Transfers: Sit to Stand;Stand to Sit Sit to Stand: 3: Mod assist;4: Min assist Stand to Sit: 4: Min assist Details for Transfer Assistance: cues for LE management and use of UEs to self assist.  +2 assist 2* pt's instability posteriorly Ambulation/Gait Ambulation/Gait Assistance: 3: Mod assist;4: Min assist Ambulation Distance (Feet): 38 Feet Assistive device: Rolling walker Ambulation/Gait Assistance Details: cues for sequence, posture, position from RW, stride length Gait Pattern: Step-to pattern    Exercises     PT Diagnosis:    PT Problem List:   PT Treatment Interventions:     PT Goals Acute Rehab PT Goals PT Goal Formulation: With patient Time For Goal Achievement: 02/10/12 Potential to Achieve Goals: Good Pt will go Supine/Side to Sit: with supervision PT Goal: Supine/Side to Sit  - Progress: Goal set today Pt will go Sit to Supine/Side: with supervision PT Goal: Sit to Supine/Side - Progress: Goal set today Pt will go Sit to Stand: with supervision PT Goal: Sit to Stand - Progress: Goal set today Pt will go Stand to Sit: with supervision PT Goal: Stand to Sit - Progress: Goal set today Pt will Ambulate: 51 - 150 feet;with supervision;with rolling walker PT Goal: Ambulate - Progress: Goal set today Pt will Go Up / Down Stairs: 3-5 stairs;with min assist;with least restrictive assistive device PT Goal: Up/Down Stairs - Progress: Goal set today  Visit Information  Last PT Received On: 02/06/12 Assistance Needed: +1    Subjective Data  Subjective: I'm doing better than this morning Patient Stated Goal: Resume previous lifestyle with decreased pain   Cognition  Overall Cognitive Status: Appears within functional limits for tasks assessed/performed Arousal/Alertness: Awake/alert Orientation Level: Appears intact for tasks assessed Behavior During Session: [facility] for tasks performed    Balance     End of Session PT - End of Session Equipment Utilized During Treatment: Gait belt Activity Tolerance: Patient tolerated treatment well Patient left: in bed;with call bell/phone within reach;with family/visitor present Nurse Communication: Mobility status CPM Right Knee CPM Right Knee: On   GP     Shannon [name] 02/06/2012, 4:39 PM

## 2012-02-06 NOTE — Evaluation (Signed)
Physical Therapy Evaluation Patient Details Name: Shannon [name] MRN: [id number] DOB: Feb 04, 1945 Today's Date: 02/06/2012 Time: 1478-2956 PT Time Calculation (min): 32 min  PT Assessment / Plan / Recommendation Clinical Impression  Pt s/p R TKR presents with decreased R LE strength/ROM, post op pain and instability with amb (?meds)  limiting functional mobility    PT Assessment  Patient needs continued PT services    Follow Up Recommendations  Home health PT    Does the patient have the potential to tolerate intense rehabilitation      Barriers to Discharge None      Equipment Recommendations  Rolling walker with 5" wheels    Recommendations for Other Services OT consult   Frequency 7X/week    Precautions / Restrictions Precautions Precautions: Knee;Fall Required Braces or Orthoses: Knee Immobilizer - Left Knee Immobilizer - Left: Discontinue once straight leg raise with < 10 degree lag (Pt performed IND SLR this am) Restrictions Weight Bearing Restrictions: No Other Position/Activity Restrictions: WBAT   Pertinent Vitals/Pain 4/10; premedicated, cold packs provided      Mobility  Bed Mobility Bed Mobility: Supine to Sit Supine to Sit: 4: Min assist Details for Bed Mobility Assistance: min cues for sequence and use of L LE to self assist Transfers Transfers: Sit to Stand;Stand to Sit Sit to Stand: 1: +2 Total assist Sit to Stand: Patient Percentage: 80% Stand to Sit: 1: +2 Total assist Stand to Sit: Patient Percentage: 80% Details for Transfer Assistance: cues for LE management and use of UEs to self assist.  +2 assist 2* pt's instability posteriorly Ambulation/Gait Ambulation/Gait Assistance: 1: +2 Total assist Ambulation/Gait: Patient Percentage: 80% Ambulation Distance (Feet): 5 Feet Assistive device: Rolling walker Ambulation/Gait Assistance Details: cues for posture, sequence, position from RW and physical assist 2* balance loss posteriorly Gait Pattern:  Step-to pattern    Shoulder Instructions     Exercises Total Joint Exercises Ankle Circles/Pumps: AROM;10 reps;Supine;Both Quad Sets: AROM;10 reps;Supine;Both Heel Slides: AAROM;10 reps;Supine;Right Straight Leg Raises: AAROM;AROM;15 reps;Right;Supine   PT Diagnosis: Difficulty walking  PT Problem List: Decreased strength;Decreased range of motion;Decreased balance;Pain;Decreased knowledge of use of DME;Decreased mobility;Decreased activity tolerance;Obesity PT Treatment Interventions: DME instruction;Gait training;Stair training;Functional mobility training;Therapeutic activities;Therapeutic exercise;Patient/family education   PT Goals Acute Rehab PT Goals PT Goal Formulation: With patient Time For Goal Achievement: 02/10/12 Potential to Achieve Goals: Good Pt will go Supine/Side to Sit: with supervision PT Goal: Supine/Side to Sit - Progress: Goal set today Pt will go Sit to Supine/Side: with supervision PT Goal: Sit to Supine/Side - Progress: Goal set today Pt will go Sit to Stand: with supervision PT Goal: Sit to Stand - Progress: Goal set today Pt will go Stand to Sit: with supervision PT Goal: Stand to Sit - Progress: Goal set today Pt will Ambulate: 51 - 150 feet;with supervision;with rolling walker PT Goal: Ambulate - Progress: Goal set today Pt will Go Up / Down Stairs: 3-5 stairs;with min assist;with least restrictive assistive device PT Goal: Up/Down Stairs - Progress: Goal set today  Visit Information  Last PT Received On: 02/06/12 Assistance Needed: +2    Subjective Data  Subjective: I'm ready but this Dilaudid is messing with me Patient Stated Goal: Resume previous lifestyle with decreased pain   Prior Functioning  Home Living Lives With: Spouse Available Help at Discharge: Family Type of Home: House Home Access: Stairs to enter Secretary/administrator of Steps: 3 Entrance Stairs-Rails: Right Home Layout: One level Home Adaptive Equipment: None Prior  Function Level of Independence:  Independent Able to Take Stairs?: Yes Driving: Yes Communication Communication: No difficulties Dominant Hand: Right    Cognition  Overall Cognitive Status: Appears within functional limits for tasks assessed/performed Arousal/Alertness: Awake/alert Orientation Level: Appears intact for tasks assessed Behavior During Session: [facility] for tasks performed    Extremity/Trunk Assessment Right Upper Extremity Assessment RUE ROM/Strength/Tone: [facility] for tasks assessed Left Upper Extremity Assessment LUE ROM/Strength/Tone: WFL for tasks assessed Right Lower Extremity Assessment RLE ROM/Strength/Tone: Deficits RLE ROM/Strength/Tone Deficits: 3/5 quads with AAROM at knee -10 - 50 Left Lower Extremity Assessment LLE ROM/Strength/Tone: WFL for tasks assessed   Balance    End of Session PT - End of Session Equipment Utilized During Treatment: Gait belt Activity Tolerance: Patient tolerated treatment well Patient left: in chair;with call bell/phone within reach Nurse Communication: Mobility status CPM Right Knee CPM Right Knee: Off  GP     Kendarrius Tanzi 02/06/2012, 12:24 PM

## 2012-02-07 LAB — BASIC METABOLIC PANEL
CO2: 28 mEq/L (ref 19–32)
Calcium: 9 mg/dL (ref 8.4–10.5)
Potassium: 4 mEq/L (ref 3.5–5.1)
Sodium: 133 mEq/L — ABNORMAL LOW (ref 135–145)

## 2012-02-07 LAB — CBC
MCH: 29 pg (ref 26.0–34.0)
Platelets: 235 10*3/uL (ref 150–400)
RBC: 2.9 MIL/uL — ABNORMAL LOW (ref 3.87–5.11)
WBC: 14.7 10*3/uL — ABNORMAL HIGH (ref 4.0–10.5)

## 2012-02-07 MED ORDER — ASPIRIN 325 MG PO TABS: 325.0000 mg | ORAL_TABLET | Freq: Two times a day (BID) | ORAL | Status: DC

## 2012-02-07 MED ORDER — ASPIRIN 325 MG PO TABS
325.0000 mg | ORAL_TABLET | Freq: Every day | ORAL | Status: DC
  Administered 2012-02-08: 325 mg via ORAL
  Filled 2012-02-07: qty 1

## 2012-02-07 MED ORDER — OXYCODONE HCL 15 MG PO TABS: 15.0000 mg | ORAL_TABLET | ORAL | Status: DC | PRN

## 2012-02-07 MED ORDER — PENTOSAN POLYSULFATE SODIUM 100 MG PO CAPS
200.0000 mg | ORAL_CAPSULE | Freq: Two times a day (BID) | ORAL | Status: DC
  Administered 2012-02-08: 200 mg via ORAL
  Filled 2012-02-07 (×2): qty 2

## 2012-02-07 MED ORDER — PENTOSAN POLYSULFATE SODIUM 100 MG PO CAPS: 200.0000 mg | ORAL_CAPSULE | Freq: Two times a day (BID) | ORAL | Status: DC

## 2012-02-07 MED ORDER — METHOCARBAMOL 500 MG PO TABS: 500.0000 mg | ORAL_TABLET | Freq: Four times a day (QID) | ORAL | Status: DC | PRN

## 2012-02-07 NOTE — Progress Notes (Signed)
Subjective: 2 Days Post-Op Procedure(s) (LRB): TOTAL KNEE ARTHROPLASTY (Right) Patient reports pain as mild.   Patient seen in rounds with Dr. Lequita Halt. Patient is well, and has had no acute complaints or problems Patient is ready to go home later today if meets her therapy goals.  Will see how she does today.  Objective: Vital signs in last 24 hours: Temp:  [97.4 F (36.3 C)-98.8 F (37.1 C)] 98.4 F (36.9 C) (01/08 0604) Pulse Rate:  [86-107] 107  (01/08 0604) Resp:  [16-18] 16  (01/08 0604) BP: (92-130)/(68-79) 130/76 mmHg (01/08 0604) SpO2:  [93 %-97 %] 93 % (01/08 0604)  Intake/Output from previous day:  Intake/Output Summary (Last 24 hours) at 02/07/12 0713 Last data filed at 02/07/12 0643  Gross per 24 hour  Intake 1245.08 ml  Output   3250 ml  Net -2004.92 ml    Intake/Output this shift:    Labs:  Basename 02/07/12 0540 02/06/12 0535  HGB 8.5* 10.2*    Basename 02/07/12 0540 02/06/12 0535  WBC 14.7* 13.0*  RBC 2.90* 3.51*  HCT 25.8* 31.1*  PLT 235 281    Basename 02/07/12 0540 02/06/12 0535  NA 133* 134*  K 4.0 4.5  CL 97 99  CO2 28 28  BUN 22 13  CREATININE 0.98 0.88  GLUCOSE 141* 163*  CALCIUM 9.0 8.9   No results found for this basename: LABPT:2,INR:2 in the last 72 hours  EXAM: General - Patient is Alert, Appropriate and Oriented Extremity - Neurovascular intact Sensation intact distally Dorsiflexion/Plantar flexion intact No cellulitis present Incision - clean, dry, no drainage, healing Motor Function - intact, moving foot and toes well on exam.   Assessment/Plan: 2 Days Post-Op Procedure(s) (LRB): TOTAL KNEE ARTHROPLASTY (Right) Procedure(s) (LRB): TOTAL KNEE ARTHROPLASTY (Right) Past Medical History  Diagnosis Date  . NICM (nonischemic cardiomyopathy) 03/08/2010    likely 2/2 chemotx - a. Echo 2012: EF 45-50%;  b. Lex MV 2/12:  low risk, apical defect (small area of ischemia vs shifting breast atten);  c.  Echo 7/12: Normal wall  thickness, EF 60-65%, normal wall motion, grade 1 diastolic dysfunction, mild LAE, PASP 32;   d. Lex MV 11/13:  EF 76%, no ischemia  . GERD 08/11/2008  . HYPERLIPIDEMIA 08/11/2008  . HYPERTENSION 08/11/2008  . LIPOMA 02/10/2009  . PREDIABETES 06/15/2009  . 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 Carpal tunnel syndrome of left wrist   . Neuropathy   . Interstitial cystitis   . Brain tumor   . DEPRESSION 08/11/2008  . 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 Osteoporosis   . Hyponatremia 08/28/2011  . Anemia 08/28/2011  . History of chicken pox   . H/O measles   . H/O mumps   . Dermatitis   . OA (osteoarthritis) 09/07/2011  . Melanoma 2008    DR. Yetta Barre  . Shortness of breath   . Asthma   . History of blood transfusion    Principal Problem:  *OA (osteoarthritis) of knee Active Problems:  Hyponatremia  Anemia  Estimated Body mass index is 37.73 kg/(m^2) as calculated from the following:   Height as of this encounter: 5\' 3" (1.6 m).   Weight as of this encounter: 213 lb(96.616 kg). Up with therapy Discharge home with home health Diet - Cardiac diet Follow up - in 2 weeks on Tuesday the 21st Activity - WBAT Disposition -  Home Condition Upon Discharge - Pending at time of note.  Home is improved. D/C Meds - See DC Summary DVT Prophylaxis - Aspirin 325 mg Twice a day  PERKINS, ALEXZANDREW 02/07/2012, 7:13 AM

## 2012-02-07 NOTE — Progress Notes (Signed)
Physical Therapy Treatment Patient Details Name: Shannon [name] MRN: [id number] DOB: 1945/06/01 Today's Date: 02/07/2012 Time: 4098-1191 PT Time Calculation (min): 24 min  PT Assessment / Plan / Recommendation Comments on Treatment Session       Follow Up Recommendations  Home health PT     Does the patient have the potential to tolerate intense rehabilitation     Barriers to Discharge        Equipment Recommendations  Rolling walker with 5" wheels    Recommendations for Other Services OT consult  Frequency 7X/week   Plan Discharge plan remains appropriate    Precautions / Restrictions Precautions Precautions: Knee;Fall Required Braces or Orthoses: Knee Immobilizer - Right Knee Immobilizer - Right: Discontinue once straight leg raise with < 10 degree lag Knee Immobilizer - Left: Discontinue once straight leg raise with < 10 degree lag Restrictions Weight Bearing Restrictions: No Other Position/Activity Restrictions: WBAT   Pertinent Vitals/Pain 4/10;     Mobility  Bed Mobility Bed Mobility: Supine to Sit Supine to Sit: 4: Min guard Details for Bed Mobility Assistance: min cues for sequence and use of L LE to self assist Transfers Transfers: Sit to Stand;Stand to Sit Sit to Stand: 4: Min guard;From bed;With upper extremity assist;From elevated surface Stand to Sit: 4: Min assist;With upper extremity assist;To bed Ambulation/Gait Ambulation/Gait Assistance: 4: Min assist Ambulation Distance (Feet): 123 Feet Assistive device: Rolling walker Ambulation/Gait Assistance Details: cues for sequence, posture, saftey awareness, position from RW and stride length.  Pt with 3 episodes of balance loss backward Gait Pattern: Step-to pattern Stairs: Yes Stairs Assistance: 4: Min assist;3: Mod assist (One episode balance loss bkwd) Stairs Assistance Details (indicate cue type and reason): cues for sequence and foot/RW placement Stair Management Technique: No rails;Backwards;With  walker Number of Stairs: 2     Exercises     PT Diagnosis:    PT Problem List:   PT Treatment Interventions:     PT Goals Acute Rehab PT Goals PT Goal Formulation: With patient Time For Goal Achievement: 02/10/12 Potential to Achieve Goals: Good Pt will go Supine/Side to Sit: with supervision PT Goal: Supine/Side to Sit - Progress: Progressing toward goal Pt will go Sit to Supine/Side: with supervision PT Goal: Sit to Supine/Side - Progress: Progressing toward goal Pt will go Sit to Stand: with supervision PT Goal: Sit to Stand - Progress: Progressing toward goal Pt will go Stand to Sit: with supervision PT Goal: Stand to Sit - Progress: Progressing toward goal Pt will Ambulate: 51 - 150 feet;with supervision;with rolling walker PT Goal: Ambulate - Progress: Progressing toward goal Pt will Go Up / Down Stairs: 3-5 stairs;with min assist;with least restrictive assistive device PT Goal: Up/Down Stairs - Progress: Progressing toward goal  Visit Information  Last PT Received On: 02/07/12 Assistance Needed: +1    Subjective Data  Subjective: I'm still a little unsteady Patient Stated Goal: Resume previous lifestyle with decreased pain   Cognition  Overall Cognitive Status: Appears within functional limits for tasks assessed/performed Arousal/Alertness: Awake/alert Orientation Level: Appears intact for tasks assessed Behavior During Session: [facility] for tasks performed    Balance     End of Session PT - End of Session Equipment Utilized During Treatment: Gait belt Activity Tolerance: Patient tolerated treatment well Patient left: in chair;with call bell/phone within reach Nurse Communication: Mobility status   GP     Jakub Debold 02/07/2012, 3:29 PM

## 2012-02-07 NOTE — Care Management Note (Signed)
    Page 1 of 2   02/07/2012     3:12:58 PM   CARE MANAGEMENT NOTE 02/07/2012  Patient:  Shannon [name], Shannon [name]   Account Number:  [id number]  Date Initiated:  02/07/2012  Documentation initiated by:  Colleen Can  Subjective/Objective Assessment:   DX osteoarthritis right knee; total knee replacemnt    Pre-arranged for [facility] to provide [facility] services after discharge     Action/Plan:   CM spoke with patient. Plans are for hewr to return to her home in [place] where she will have caregiver. Will need rw and 3n1. States [facility] will provide [facility] services.   Anticipated DC Date:  02/07/2012   Anticipated DC Plan:  HOME W HOME HEALTH SERVICES  In-house referral  NA      DC Planning Services  CM consult      [facility] Choice  HOME HEALTH  DURABLE MEDICAL EQUIPMENT   Choice offered to / List presented to:  C-1 Patient   DME arranged  3-N-1  Levan Hurst      DME agency  [facility]     HH arranged  HH-2 PT      [facility] agency  [facility]   Status of service:  Completed, signed off Medicare Important Message given?  NA - LOS <3 / Initial given by admissions (If response is "NO", the following Medicare IM given date fields will be blank) Date Medicare IM given:   Date Additional Medicare IM given:    Discharge Disposition:  HOME W HOME HEALTH SERVICES  Per UR Regulation:    If discussed at Long Length of Stay Meetings, dates discussed:    Comments:  02/07/2011 Colleen Can BSN ZRN CCM [phone] [facility] will provide [facility] services with start date 02/08/2012. DME has been delivered to patient's room.

## 2012-02-07 NOTE — Evaluation (Signed)
Occupational Therapy Evaluation Patient Details Name: Shannon [name] MRN: [id number] DOB: 1945/02/28 Today's Date: 02/07/2012 Time: 4098-1191 OT Time Calculation (min): 32 min  OT Assessment / Plan / Recommendation Clinical Impression  Pt is s/p R TKA and displays decreased balance, strength and increased pain which is impacting her ADL independence. She will benefit from skilled OT services to improve safety and independence with self care tasks.     OT Assessment  Patient needs continued OT Services    Follow Up Recommendations  Home health OT;Supervision/Assistance - 24 hour    Barriers to Discharge      Equipment Recommendations  3 in 1 bedside comode    Recommendations for Other Services    Frequency  Min 2X/week    Precautions / Restrictions Precautions Precautions: Knee;Fall Required Braces or Orthoses: Knee Immobilizer - Right Knee Immobilizer - Right: Discontinue once straight leg raise with < 10 degree lag Restrictions Weight Bearing Restrictions: No Other Position/Activity Restrictions: WBAT        ADL  Eating/Feeding: Simulated;Independent Where Assessed - Eating/Feeding: Chair Grooming: Simulated;Wash/dry face;Set up Where Assessed - Grooming: Unsupported sitting Upper Body Bathing: Performed;Chest;Right arm;Left arm;Abdomen;Set up Where Assessed - Upper Body Bathing: Unsupported sitting Lower Body Bathing: Simulated;Moderate assistance Where Assessed - Lower Body Bathing: Supported sit to stand Upper Body Dressing: Performed;Set up Where Assessed - Upper Body Dressing: Supported sitting Lower Body Dressing: Performed;Maximal assistance Where Assessed - Lower Body Dressing: Supported sit to stand Toilet Transfer: Performed;Minimal assistance;Moderate assistance Toilet Transfer Method: Stand pivot Toilet Transfer Equipment: Bedside commode Toileting - Clothing Manipulation and Hygiene: Performed;Minimal assistance;Moderate assistance Where Assessed -  Toileting Clothing Manipulation and Hygiene: Sit to stand from 3-in-1 or toilet Tub/Shower Transfer Method: Not assessed Equipment Used: Rolling walker ADL Comments: Pt is unsteady when up on feet performing dynamic balance tasks such as pulling up pants, etc. Educated pt on keeping one had on RW while pulling up pants with opposite hand and then switch. Pt tends to try to move quickly/rush through tasks and she admits she needs to slow down for safety. Discussed KI don/doff and how long she will need to wear. Pt states husband can help with LB ADL. Pt also reports feeling alittle "woozy" this am but improved from yesterday. Discussed safety with shower transfer and OT advises to continue sponge bathing for a few days as pt still woozy and unsteady on feet and let HHOT assess shower transfer unless pt is more steady to practice before d/c from acute.    OT Diagnosis: Generalized weakness  OT Problem List: Decreased strength;Impaired balance (sitting and/or standing);Decreased knowledge of use of DME or AE OT Treatment Interventions: Self-care/ADL training;Therapeutic activities;DME and/or AE instruction;Patient/family education   OT Goals Acute Rehab OT Goals OT Goal Formulation: With patient Time For Goal Achievement: 02/14/12 Potential to Achieve Goals: Good ADL Goals Pt Will Perform Grooming: with supervision;Standing at sink ADL Goal: Grooming - Progress: Goal set today Pt Will Transfer to Toilet: with supervision;Ambulation;with DME;3-in-1 ADL Goal: Toilet Transfer - Progress: Goal set today Pt Will Perform Toileting - Clothing Manipulation: with supervision;Standing ADL Goal: Toileting - Clothing Manipulation - Progress: Goal set today Pt Will Perform Toileting - Hygiene: with supervision;Standing at 3-in-1/toilet ADL Goal: Toileting - Hygiene - Progress: Goal set today Pt Will Perform Tub/Shower Transfer: with min assist;with DME;Shower transfer ADL Goal: Web designer -  Progress: Goal set today  Visit Information  Last OT Received On: 02/07/12 Assistance Needed: +1    Subjective Data  Subjective: I  want to get washed up Patient Stated Goal: as above   Prior Functioning     Home Living Lives With: Spouse Available Help at Discharge: Family Type of Home: House Home Access: Stairs to enter Secretary/administrator of Steps: 3 Entrance Stairs-Rails: Right Home Layout: One level Bathroom Shower/Tub: Health visitor: Standard Home Adaptive Equipment: None Prior Function Level of Independence: Independent Able to Take Stairs?: Yes Driving: Yes Communication Communication: No difficulties Dominant Hand: Right         Vision/Perception     Cognition  Overall Cognitive Status: Appears within functional limits for tasks assessed/performed Arousal/Alertness: Awake/alert Orientation Level: Appears intact for tasks assessed Behavior During Session: [facility] for tasks performed    Extremity/Trunk Assessment Right Upper Extremity Assessment RUE ROM/Strength/Tone: [facility] for tasks assessed Left Upper Extremity Assessment LUE ROM/Strength/Tone: WFL for tasks assessed     Mobility Transfers Transfers: Sit to Stand;Stand to Sit Sit to Stand: 4: Min assist;With upper extremity assist;From chair/3-in-1 Stand to Sit: 4: Min assist;With upper extremity assist;To chair/3-in-1 Details for Transfer Assistance: verbal cues for hand placement and R LE out in front     Shoulder Instructions     Exercise     Balance Balance Balance Assessed: Yes Dynamic Standing Balance Dynamic Standing - Level of Assistance: 4: Min assist;3: Mod assist   End of Session OT - End of Session Activity Tolerance: Patient tolerated treatment well Patient left: in chair;with call bell/phone within reach  GO     Lennox Laity [phone] 02/07/2012, 9:26 AM

## 2012-02-07 NOTE — Progress Notes (Signed)
Physical Therapy Treatment Patient Details Name: Shannon [name] MRN: [id number] DOB: Oct 26, 1945 Today's Date: 02/07/2012 Time: 1010-1054 PT Time Calculation (min): 44 min  PT Assessment / Plan / Recommendation Comments on Treatment Session  Improvement from last session but pt still unstable with ambulation and c/o mild dizziness     Follow Up Recommendations  Home health PT     Does the patient have the potential to tolerate intense rehabilitation     Barriers to Discharge        Equipment Recommendations  Rolling walker with 5" wheels    Recommendations for Other Services    Frequency 7X/week   Plan Discharge plan remains appropriate    Precautions / Restrictions Precautions Precautions: Knee;Fall Required Braces or Orthoses: Knee Immobilizer - Right Knee Immobilizer - Right: Discontinue once straight leg raise with < 10 degree lag (Pt performed IND SLR this am) Restrictions Weight Bearing Restrictions: No Other Position/Activity Restrictions: WBAT   Pertinent Vitals/Pain 5/10; premedicated; cold packs applied    Mobility  Bed Mobility Bed Mobility: Sit to Supine Sit to Supine: 4: Min assist;4: Min guard Details for Bed Mobility Assistance: min cues for sequence and use of L LE to self assist Transfers Transfers: Sit to Stand;Stand to Sit Sit to Stand: 4: Min assist;With upper extremity assist;With armrests;From chair/3-in-1 Stand to Sit: 4: Min assist;With upper extremity assist;To bed Details for Transfer Assistance: cues for LE management and use of UEs; assist to bring wt fwd and balance Ambulation/Gait Ambulation/Gait Assistance: 4: Min assist Ambulation Distance (Feet): 144 Feet Assistive device: Rolling walker Ambulation/Gait Assistance Details: cues for posture, position from RW, stride length, step to gait; physical assist for stability including two episodes balance loss backwards Gait Pattern: Step-to pattern    Exercises Total Joint Exercises Ankle  Circles/Pumps: AROM;Supine;Both;20 reps Quad Sets: AROM;Supine;Both;20 reps Heel Slides: AAROM;Supine;Right;20 reps Straight Leg Raises: AAROM;AROM;Right;Supine;20 reps   PT Diagnosis:    PT Problem List:   PT Treatment Interventions:     PT Goals Acute Rehab PT Goals PT Goal Formulation: With patient Time For Goal Achievement: 02/10/12 Potential to Achieve Goals: Good Pt will go Supine/Side to Sit: with supervision PT Goal: Supine/Side to Sit - Progress: Progressing toward goal Pt will go Sit to Supine/Side: with supervision PT Goal: Sit to Supine/Side - Progress: Progressing toward goal Pt will go Sit to Stand: with supervision PT Goal: Sit to Stand - Progress: Progressing toward goal Pt will go Stand to Sit: with supervision PT Goal: Stand to Sit - Progress: Progressing toward goal Pt will Ambulate: 51 - 150 feet;with supervision;with rolling walker PT Goal: Ambulate - Progress: Progressing toward goal  Visit Information  Last PT Received On: 02/07/12 Assistance Needed: +1    Subjective Data  Subjective: I'm doing better than this morning Patient Stated Goal: Resume previous lifestyle with decreased pain   Cognition  Overall Cognitive Status: Appears within functional limits for tasks assessed/performed Arousal/Alertness: Awake/alert Orientation Level: Appears intact for tasks assessed Behavior During Session: [facility] for tasks performed    Balance  Balance Balance Assessed: Yes Dynamic Standing Balance Dynamic Standing - Level of Assistance: 4: Min assist;3: Mod assist  End of Session PT - End of Session Equipment Utilized During Treatment: Gait belt Activity Tolerance: Patient tolerated treatment well Patient left: in chair;with call bell/phone within reach Nurse Communication: Mobility status   GP     Sekai Nayak 02/07/2012, 11:58 AM

## 2012-02-08 ENCOUNTER — Encounter (HOSPITAL_COMMUNITY): Payer: Self-pay | Admitting: Orthopedic Surgery

## 2012-02-08 LAB — CBC
MCHC: 31.9 g/dL (ref 30.0–36.0)
RDW: 14.2 % (ref 11.5–15.5)

## 2012-02-08 MED ORDER — METHOCARBAMOL 500 MG PO TABS: 500.0000 mg | ORAL_TABLET | Freq: Four times a day (QID) | ORAL | Status: DC | PRN

## 2012-02-08 MED ORDER — OXYCODONE HCL 15 MG PO TABS: 15.0000 mg | ORAL_TABLET | ORAL | Status: DC | PRN

## 2012-02-08 NOTE — Discharge Summary (Signed)
Physician Discharge Summary   Patient ID: Shannon [name] MRN: [id number] DOB/AGE: 07/01/1945 67 y.o.  Admit date: 02/05/2012 Discharge date: 02/08/2012  Primary Diagnosis: Osteoarthritis Right knee  Admission Diagnoses:  Past Medical History  Diagnosis Date  . NICM (nonischemic cardiomyopathy) 03/08/2010    likely 2/2 chemotx - a. Echo 2012: EF 45-50%;  b. Lex MV 2/12:  low risk, apical defect (small area of ischemia vs shifting breast atten);  c.  Echo 7/12: Normal wall thickness, EF 60-65%, normal wall motion, grade 1 diastolic dysfunction, mild LAE, PASP 32;   d. Lex MV 11/13:  EF 76%, no ischemia  . GERD 08/11/2008  . HYPERLIPIDEMIA 08/11/2008  . HYPERTENSION 08/11/2008  . LIPOMA 02/10/2009  . PREDIABETES 06/15/2009  . 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 Carpal tunnel syndrome of left wrist   . Neuropathy   . Interstitial cystitis   . Brain tumor   . DEPRESSION 08/11/2008  . 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 Osteoporosis   . Hyponatremia 08/28/2011  . Anemia 08/28/2011  . History of chicken pox   . H/O measles   . H/O mumps   . Dermatitis   . OA (osteoarthritis) 09/07/2011  . Melanoma 2008    DR. Yetta Barre  . Shortness of breath   . Asthma   . History of blood transfusion    Discharge Diagnoses:   Principal Problem:  *OA (osteoarthritis) of knee Active Problems:  Hyponatremia  Postop Anemia  Estimated Body mass index is 37.73 kg/(m^2) as calculated from the following:   Height as of this encounter: 5\' 3" (1.6 m).   Weight as of this encounter: 213 lb(96.616 kg).  Classification of overweight in adults according to BMI (WHO, 1998)   Procedure:  Procedure(s) (LRB): TOTAL KNEE ARTHROPLASTY (Right)   Consults: None  HPI: Shannon [name] is a 67 y.o. year old female with end stage OA of her right knee with progressively worsening pain and dysfunction. She has constant pain, with activity and at  rest and significant functional deficits with difficulties even with ADLs. She has had extensive non-op management including analgesics, injections of cortisone and viscosupplements, and home exercise program, but remains in significant pain with significant dysfunction.Radiographs show bone on bone arthritis patellofemoral with medial joint space narrowing. She presents now for right Total Knee Arthroplasty.   Laboratory Data: Admission on 02/05/2012  Component Date Value Range Status  . ABO/RH(D) 02/05/2012 O POS   Final  . Antibody Screen 02/05/2012 NEG   Final  . Sample Expiration 02/05/2012 02/08/2012   Final  . WBC 02/06/2012 13.0* 4.0 - 10.5 K/uL Final  . RBC 02/06/2012 3.51* 3.87 - 5.11 MIL/uL Final  . Hemoglobin 02/06/2012 10.2* 12.0 - 15.0 g/dL Final  . HCT 40/98/1191 31.1* 36.0 - 46.0 % Final  . MCV 02/06/2012 88.6  78.0 - 100.0 fL Final  . MCH 02/06/2012 29.1  26.0 - 34.0 pg Final  . MCHC 02/06/2012 32.8  30.0 - 36.0 g/dL Final  . RDW 47/82/9562 13.8  11.5 - 15.5 % Final  . Platelets 02/06/2012 281  150 - 400 K/uL Final  . Sodium 02/06/2012 134* 135 - 145 mEq/L Final  . Potassium 02/06/2012 4.5  3.5 - 5.1 mEq/L Final  . Chloride 02/06/2012 99  96 - 112 mEq/L Final  . CO2 02/06/2012 28  19 - 32 mEq/L Final  . Glucose,  Bld 02/06/2012 163* 70 - 99 mg/dL Final  . BUN 16/10/9602 13  6 - 23 mg/dL Final  . Creatinine, Ser 02/06/2012 0.88  0.50 - 1.10 mg/dL Final  . Calcium 54/09/8117 8.9  8.4 - 10.5 mg/dL Final  . GFR calc non Af Amer 02/06/2012 66* >90 mL/min Final  . GFR calc Af Amer 02/06/2012 77* >90 mL/min Final   Comment:                                 The eGFR has been calculated                          using the CKD EPI equation.                          This calculation has not been                          validated in all clinical                          situations.                          eGFR's persistently                          <90 mL/min signify                           possible Chronic Kidney Disease.  . WBC 02/07/2012 14.7* 4.0 - 10.5 K/uL Final  . RBC 02/07/2012 2.90* 3.87 - 5.11 MIL/uL Final  . Hemoglobin 02/07/2012 8.5* 12.0 - 15.0 g/dL Final  . HCT 14/78/2956 25.8* 36.0 - 46.0 % Final  . MCV 02/07/2012 89.0  78.0 - 100.0 fL Final  . MCH 02/07/2012 29.0  26.0 - 34.0 pg Final  . MCHC 02/07/2012 32.6  30.0 - 36.0 g/dL Final  . RDW 21/30/8657 14.1  11.5 - 15.5 % Final  . Platelets 02/07/2012 235  150 - 400 K/uL Final  . Sodium 02/07/2012 133* 135 - 145 mEq/L Final  . Potassium 02/07/2012 4.0  3.5 - 5.1 mEq/L Final  . Chloride 02/07/2012 97  96 - 112 mEq/L Final  . CO2 02/07/2012 28  19 - 32 mEq/L Final  . Glucose, Bld 02/07/2012 141* 70 - 99 mg/dL Final  . BUN 84/69/6295 22  6 - 23 mg/dL Final  . Creatinine, Ser 02/07/2012 0.98  0.50 - 1.10 mg/dL Final  . Calcium 28/41/3244 9.0  8.4 - 10.5 mg/dL Final  . GFR calc non Af Amer 02/07/2012 58* >90 mL/min Final  . GFR calc Af Amer 02/07/2012 68* >90 mL/min Final   Comment:                                 The eGFR has been calculated                          using the CKD EPI equation.  This calculation has not been                          validated in all clinical                          situations.                          eGFR's persistently                          <90 mL/min signify                          possible Chronic Kidney Disease.  . WBC 02/08/2012 10.9* 4.0 - 10.5 K/uL Final  . RBC 02/08/2012 2.90* 3.87 - 5.11 MIL/uL Final  . Hemoglobin 02/08/2012 8.3* 12.0 - 15.0 g/dL Final  . HCT 16/10/9602 26.0* 36.0 - 46.0 % Final  . MCV 02/08/2012 89.7  78.0 - 100.0 fL Final  . MCH 02/08/2012 28.6  26.0 - 34.0 pg Final  . MCHC 02/08/2012 31.9  30.0 - 36.0 g/dL Final  . RDW 54/09/8117 14.2  11.5 - 15.5 % Final  . Platelets 02/08/2012 226  150 - 400 K/uL Final  Hospital Outpatient Visit on 01/29/2012  Component Date Value Range Status  . aPTT 01/29/2012  28  24 - 37 seconds Final  . WBC 01/29/2012 6.4  4.0 - 10.5 K/uL Final  . RBC 01/29/2012 3.84* 3.87 - 5.11 MIL/uL Final  . Hemoglobin 01/29/2012 11.1* 12.0 - 15.0 g/dL Final  . HCT 14/78/2956 34.5* 36.0 - 46.0 % Final  . MCV 01/29/2012 89.8  78.0 - 100.0 fL Final  . MCH 01/29/2012 28.9  26.0 - 34.0 pg Final  . MCHC 01/29/2012 32.2  30.0 - 36.0 g/dL Final  . RDW 21/30/8657 14.0  11.5 - 15.5 % Final  . Platelets 01/29/2012 301  150 - 400 K/uL Final  . Sodium 01/29/2012 143  135 - 145 mEq/L Final  . Potassium 01/29/2012 4.6  3.5 - 5.1 mEq/L Final  . Chloride 01/29/2012 106  96 - 112 mEq/L Final  . CO2 01/29/2012 31  19 - 32 mEq/L Final  . Glucose, Bld 01/29/2012 92  70 - 99 mg/dL Final  . BUN 84/69/6295 15  6 - 23 mg/dL Final  . Creatinine, Ser 01/29/2012 0.86  0.50 - 1.10 mg/dL Final  . Calcium 28/41/3244 9.6  8.4 - 10.5 mg/dL Final  . Total Protein 01/29/2012 6.8  6.0 - 8.3 g/dL Final  . Albumin 02/01/7251 3.8  3.5 - 5.2 g/dL Final  . AST 66/44/0347 26  0 - 37 U/L Final  . ALT 01/29/2012 26  0 - 35 U/L Final  . Alkaline Phosphatase 01/29/2012 94  39 - 117 U/L Final  . Total Bilirubin 01/29/2012 0.3  0.3 - 1.2 mg/dL Final  . GFR calc non Af Amer 01/29/2012 68* >90 mL/min Final  . GFR calc Af Amer 01/29/2012 79* >90 mL/min Final   Comment:                                 The eGFR has been calculated  using the CKD EPI equation.                          This calculation has not been                          validated in all clinical                          situations.                          eGFR's persistently                          <90 mL/min signify                          possible Chronic Kidney Disease.  Marland Kitchen Prothrombin Time 01/29/2012 12.7  11.6 - 15.2 seconds Final  . INR 01/29/2012 0.96  0.00 - 1.49 Final  . Color, Urine 01/29/2012 YELLOW  YELLOW Final  . APPearance 01/29/2012 CLEAR  CLEAR Final  . Specific Gravity, Urine 01/29/2012 1.009  1.005 -  1.030 Final  . pH 01/29/2012 7.5  5.0 - 8.0 Final  . Glucose, UA 01/29/2012 NEGATIVE  NEGATIVE mg/dL Final  . Hgb urine dipstick 01/29/2012 NEGATIVE  NEGATIVE Final  . Bilirubin Urine 01/29/2012 NEGATIVE  NEGATIVE Final  . Ketones, ur 01/29/2012 NEGATIVE  NEGATIVE mg/dL Final  . Protein, ur 41/32/4401 NEGATIVE  NEGATIVE mg/dL Final  . Urobilinogen, UA 01/29/2012 0.2  0.0 - 1.0 mg/dL Final  . Nitrite 02/72/5366 NEGATIVE  NEGATIVE Final  . Leukocytes, UA 01/29/2012 MODERATE* NEGATIVE Final  . MRSA, PCR 01/29/2012 NEGATIVE  NEGATIVE Final  . Staphylococcus aureus 01/29/2012 NEGATIVE  NEGATIVE Final   Comment:                                 The Xpert SA Assay (FDA                          approved for NASAL specimens                          in patients over 80 years of age),                          is one component of                          a comprehensive surveillance                          program.  Test performance has                          been validated by [employer] for patients greater  than or equal to 81 year old.                          It is not intended                          to diagnose infection nor to                          guide or monitor treatment.  Marland Kitchen Specimen Description 01/29/2012 URINE, CLEAN CATCH   Final  . Special Requests 01/29/2012 NONE   Final  . Culture  Setup Time 01/29/2012 01/29/2012 19:52   Final  . Colony Count 01/29/2012 10,000 COLONIES/ML   Final  . Culture 01/29/2012 Multiple bacterial morphotypes present, none predominant. Suggest appropriate recollection if clinically indicated.   Final  . Report Status 01/29/2012 01/30/2012 FINAL   Final  . Squamous Epithelial / LPF 01/29/2012 FEW* RARE Final  . WBC, UA 01/29/2012 11-20  <3 WBC/hpf Final  . Bacteria, UA 01/29/2012 FEW* RARE Final  . Urine-Other 01/29/2012 MUCOUS PRESENT   Final  Office Visit on 12/18/2011  Component Date Value Range  Status  . Sodium 12/18/2011 138  135 - 145 mEq/L Final  . Potassium 12/18/2011 3.9  3.5 - 5.1 mEq/L Final  . Chloride 12/18/2011 102  96 - 112 mEq/L Final  . CO2 12/18/2011 29  19 - 32 mEq/L Final  . Glucose, Bld 12/18/2011 96  70 - 99 mg/dL Final  . BUN 16/10/9602 20  6 - 23 mg/dL Final  . Creatinine, Ser 12/18/2011 1.0  0.4 - 1.2 mg/dL Final  . Calcium 54/09/8117 9.2  8.4 - 10.5 mg/dL Final  . GFR 14/78/2956 56.72* >60.00 mL/min Final  . Pro B Natriuretic peptide (BNP) 12/18/2011 34.0  0.0 - 100.0 pg/mL Final     X-Rays:No results found.  EKG: Orders placed in visit on 01/03/12  . EKG 12-LEAD     Hospital Course: Shannon [name] is a 67 y.o. who was admitted to [facility]. They were brought to the operating room on 02/05/2012 and underwent Procedure(s): TOTAL KNEE ARTHROPLASTY.  Patient tolerated the procedure well and was later transferred to the recovery room and then to the orthopaedic floor for postoperative care.  They were given PO and IV analgesics for pain control following their surgery.  They were given 24 hours of postoperative antibiotics of  Anti-infectives     Start     Dose/Rate Route Frequency Ordered Stop   02/05/12 1900   ceFAZolin (ANCEF) IVPB 1 g/50 mL premix        1 g 100 mL/hr over 30 Minutes Intravenous Every 6 hours 02/05/12 1556 02/06/12 0535   02/04/12 1559   ceFAZolin (ANCEF) IVPB 2 g/50 mL premix        2 g 100 mL/hr over 30 Minutes Intravenous 60 min pre-op 02/04/12 1559 02/05/12 1246         and started on DVT prophylaxis in the form of Xarelto.   PT and OT were ordered for total joint protocol.  Discharge planning consulted to help with postop disposition and equipment needs.  Patient had a decent night on the evening of surgery and started to get up OOB with therapy on day one walking over 30 feet. Hemovac drain was pulled without difficulty.  Continued to work with therapy into day two walking over 120  feet.  Dressing was changed on day two  and the incision was healing well.  By day three, the patient had progressed with therapy and meeting their goals.  Incision was healing well.  Patient was seen in rounds and was ready to go home. She was switched to ASA before going home.  Discharge Medications: Prior to Admission medications   Medication Sig Start Date End Date Taking? Authorizing Provider  carvedilol (COREG) 6.25 MG tablet Take 6.25 mg by mouth 2 (two) times daily.   Yes Historical Provider, MD  ferrous gluconate (FERGON) 324 MG tablet Take 324 mg by mouth daily with breakfast.   Yes Historical Provider, MD  folic acid (FOLVITE) 1 MG tablet Take 1 mg by mouth every morning.   Yes Historical Provider, MD  gabapentin (NEURONTIN) 300 MG capsule Take 300 mg by mouth at bedtime.   Yes Historical Provider, MD  hydrOXYzine (ATARAX/VISTARIL) 25 MG tablet Take 25 mg by mouth at bedtime.   Yes Historical Provider, MD  LORazepam (ATIVAN) 0.5 MG tablet Take 1 tablet (0.5 mg total) by mouth at bedtime. 02/01/12  Yes Bradd Canary, MD  omeprazole (PRILOSEC) 40 MG capsule Take 40 mg by mouth every morning.   Yes Historical Provider, MD  psyllium (METAMUCIL SMOOTH TEXTURE) 28 % packet Take 1 packet by mouth daily. 12/12/11  Yes Bradd Canary, MD  rosuvastatin (CRESTOR) 20 MG tablet Take 20 mg by mouth at bedtime.   Yes Historical Provider, MD  sucralfate (CARAFATE) 1 G tablet Take 1 g by mouth daily as needed.   Yes Historical Provider, MD  telmisartan (MICARDIS) 80 MG tablet Take 80 mg by mouth every morning.   Yes Historical Provider, MD  venlafaxine XR (EFFEXOR-XR) 75 MG 24 hr capsule Take 75 mg by mouth at bedtime.   Yes Historical Provider, MD  aspirin 325 MG tablet Take 1 tablet (325 mg total) by mouth 2 (two) times daily. Take for three weeks.  Once the patient has completed the full dose aspirin twice a day for three weeks, then discontinue the full dose aspirin and resume the 81 mg Aspirin daily. 02/07/12   Taylan Marez Julien Girt, PA    guaiFENesin (MUCINEX) 600 MG 12 hr tablet Take 600 mg by mouth every 12 (twelve) hours as needed. For congestion 09/12/11 09/11/12  Bradd Canary, MD  hydrochlorothiazide (HYDRODIURIL) 25 MG tablet Take 25 mg by mouth every morning.    Historical Provider, MD  methocarbamol (ROBAXIN) 500 MG tablet Take 1 tablet (500 mg total) by mouth every 6 (six) hours as needed. 02/07/12   Tally Mattox Julien Girt, PA  oxyCODONE (ROXICODONE) 15 MG immediate release tablet Take 1 tablet (15 mg total) by mouth every 3 (three) hours as needed. 02/07/12   Chastity Noland Julien Girt, PA  pentosan polysulfate (ELMIRON) 100 MG capsule Take 2 capsules (200 mg total) by mouth 2 (two) times daily. 02/07/12   Yonael Tulloch Julien Girt, PA    Diet: Cardiac diet Activity:WBAT Follow-up:in 2 weeks on Tuesday the 21st Disposition - Home Discharged Condition: good   Discharge Orders    Future Appointments: Provider: Department: Dept Phone: Center:   03/13/2012 3:00 PM Gaylord Shih, MD [facility] Main Office [place]) [phone] [email or web address]   04/08/2012 10:00 AM Radene Gunning [facility] MEDICAL ONCOLOGY [phone] None   04/15/2012 11:15 AM Amy Allegra Grana, PA [facility] MEDICAL ONCOLOGY [phone] None     Future Orders Please Complete By Expires   Diet - low sodium heart healthy  Call MD / Call 911      Comments:   If you experience chest pain or shortness of breath, CALL 911 and be transported to the hospital emergency room.  If you develope a fever above 101 F, pus (white drainage) or increased drainage or redness at the wound, or calf pain, call your surgeon's office.   Discharge instructions      Comments:   Pick up stool softner and laxative for home. Do not submerge incision under water. May shower. Continue to use ice for pain and swelling from surgery.  Take Xarelto for two and a half more weeks, then discontinue Xarelto. Once the patient has completed the Xarelto, they may resume the  81 mg Aspirin.   Constipation Prevention      Comments:   Drink plenty of fluids.  Prune juice may be helpful.  You may use a stool softener, such as Colace (over the counter) 100 mg twice a day.  Use MiraLax (over the counter) for constipation as needed.   Increase activity slowly as tolerated      Patient may shower      Comments:   You may shower without a dressing once there is no drainage.  Do not wash over the wound.  If drainage remains, do not shower until drainage stops.   Weight bearing as tolerated      Driving restrictions      Comments:   No driving until released by the physician.   Lifting restrictions      Comments:   No lifting until released by the physician.   TED hose      Comments:   Use stockings (TED hose) for 3 weeks on both leg(s).  You may remove them at night for sleeping.   Change dressing      Comments:   Change dressing daily with sterile 4 x 4 inch gauze dressing and apply TED hose. Do not submerge the incision under water.   Do not put a pillow under the knee. Place it under the heel.      Do not sit on low chairs, stoools or toilet seats, as it may be difficult to get up from low surfaces          Medication List     As of 02/08/2012  8:01 AM    STOP taking these medications         ALIGN 4 MG Caps      aspirin EC 81 MG tablet      ATELVIA 35 MG Tbec   Generic drug: Risedronate Sodium      CENTRUM SILVER ULTRA WOMENS Tabs      cholecalciferol 1000 UNITS tablet   Commonly known as: VITAMIN D      ibuprofen 200 MG tablet   Commonly known as: ADVIL,MOTRIN      Krill Oil Omega-3 300 MG Caps      VITAMIN B COMPLEX-C PO      TAKE these medications         aspirin 325 MG tablet   Take 1 tablet (325 mg total) by mouth 2 (two) times daily. Take for three weeks.  Once the patient has completed the full dose aspirin twice a day for three weeks, then discontinue the full dose aspirin and resume the 81 mg Aspirin daily.      carvedilol 6.25  MG tablet   Commonly known as: COREG   Take 6.25 mg by mouth 2 (two) times daily.  ferrous gluconate 324 MG tablet   Commonly known as: FERGON   Take 324 mg by mouth daily with breakfast.      folic acid 1 MG tablet   Commonly known as: FOLVITE   Take 1 mg by mouth every morning.      gabapentin 300 MG capsule   Commonly known as: NEURONTIN   Take 300 mg by mouth at bedtime.      guaiFENesin 600 MG 12 hr tablet   Commonly known as: MUCINEX   Take 600 mg by mouth every 12 (twelve) hours as needed. For congestion      hydrochlorothiazide 25 MG tablet   Commonly known as: HYDRODIURIL   Take 25 mg by mouth every morning.      hydrOXYzine 25 MG tablet   Commonly known as: ATARAX/VISTARIL   Take 25 mg by mouth at bedtime.      LORazepam 0.5 MG tablet   Commonly known as: ATIVAN   Take 1 tablet (0.5 mg total) by mouth at bedtime.      methocarbamol 500 MG tablet   Commonly known as: ROBAXIN   Take 1 tablet (500 mg total) by mouth every 6 (six) hours as needed.      omeprazole 40 MG capsule   Commonly known as: PRILOSEC   Take 40 mg by mouth every morning.      oxyCODONE 15 MG immediate release tablet   Commonly known as: ROXICODONE   Take 1 tablet (15 mg total) by mouth every 3 (three) hours as needed.      pentosan polysulfate 100 MG capsule   Commonly known as: ELMIRON   Take 2 capsules (200 mg total) by mouth 2 (two) times daily.      psyllium 28 % packet   Commonly known as: METAMUCIL SMOOTH TEXTURE   Take 1 packet by mouth daily.      rosuvastatin 20 MG tablet   Commonly known as: CRESTOR   Take 20 mg by mouth at bedtime.      sucralfate 1 G tablet   Commonly known as: CARAFATE   Take 1 g by mouth daily as needed.      telmisartan 80 MG tablet   Commonly known as: MICARDIS   Take 80 mg by mouth every morning.      venlafaxine XR 75 MG 24 hr capsule   Commonly known as: EFFEXOR-XR   Take 75 mg by mouth at bedtime.           Follow-up  Information    Follow up with Loanne Drilling, MD. Schedule an appointment as soon as possible for a visit on 02/20/2012.   Contact information:   [address] [place] [address] [phone]          Signed: Patrica Duel 02/08/2012, 8:01 AM

## 2012-02-08 NOTE — Progress Notes (Signed)
Physical Therapy Treatment Patient Details Name: Shannon [name] MRN: [id number] DOB: 01-13-1946 Today's Date: 02/08/2012 Time: 4098-1191 PT Time Calculation (min): 39 min  PT Assessment / Plan / Recommendation Comments on Treatment Session  Marked improvement in pt stability.  Husband states he will assist pt when up on feet .    Follow Up Recommendations  Home health PT     Does the patient have the potential to tolerate intense rehabilitation     Barriers to Discharge        Equipment Recommendations  Rolling walker with 5" wheels    Recommendations for Other Services OT consult  Frequency 7X/week   Plan Discharge plan remains appropriate    Precautions / Restrictions Precautions Precautions: Fall;Knee Required Braces or Orthoses: Knee Immobilizer - Right Knee Immobilizer - Right: Other (comment) Knee Immobilizer - Left: Discontinue once straight leg raise with < 10 degree lag Restrictions Weight Bearing Restrictions: No Other Position/Activity Restrictions: WBAT   Pertinent Vitals/Pain 4/10; premedicated,     Mobility  Bed Mobility Bed Mobility: Not assessed Supine to Sit: 5: Supervision;HOB elevated Transfers Transfers: Sit to Stand;Stand to Sit Sit to Stand: 5: Supervision Stand to Sit: 5: Supervision Details for Transfer Assistance: verbal cues for hand placement and R LE out in front. needs min guard assist for safety as she is still unsteady. Ambulation/Gait Ambulation/Gait Assistance: 4: Min guard;5: Supervision Ambulation Distance (Feet): 150 Feet Assistive device: Rolling walker Ambulation/Gait Assistance Details: min cues for posture and position from RW Gait Pattern: Step-to pattern Stairs: Yes Stairs Assistance: 4: Min assist Stairs Assistance Details (indicate cue type and reason): cues for sequence, foot/RW placement - husband assisting  on second attempt Stair Management Technique: No rails;Backwards;With walker Number of Stairs: 4  (twice)      Exercises Total Joint Exercises Ankle Circles/Pumps: AROM;Supine;Both;20 reps Quad Sets: AROM;Supine;Both;20 reps Heel Slides: AAROM;Supine;Right;20 reps Straight Leg Raises: AAROM;AROM;Right;Supine;20 reps Long Arc Quad: AROM;10 reps;Seated;Both   PT Diagnosis:    PT Problem List:   PT Treatment Interventions:     PT Goals Acute Rehab PT Goals PT Goal Formulation: With patient Time For Goal Achievement: 02/10/12 Potential to Achieve Goals: Good Pt will go Supine/Side to Sit: with supervision PT Goal: Supine/Side to Sit - Progress: Progressing toward goal Pt will go Sit to Supine/Side: with supervision PT Goal: Sit to Supine/Side - Progress: Progressing toward goal Pt will go Sit to Stand: with supervision PT Goal: Sit to Stand - Progress: Progressing toward goal Pt will go Stand to Sit: with supervision PT Goal: Stand to Sit - Progress: Progressing toward goal Pt will Ambulate: 51 - 150 feet;with supervision;with rolling walker PT Goal: Ambulate - Progress: Progressing toward goal Pt will Go Up / Down Stairs: 3-5 stairs;with min assist;with least restrictive assistive device PT Goal: Up/Down Stairs - Progress: Progressing toward goal  Visit Information  Last PT Received On: 02/08/12 Assistance Needed: +1    Subjective Data  Subjective: I am feeling much better than yesterday Patient Stated Goal: Resume previous lifestyle with decreased pain   Cognition  Overall Cognitive Status: Appears within functional limits for tasks assessed/performed Arousal/Alertness: Awake/alert Orientation Level: Appears intact for tasks assessed Behavior During Session: [facility] for tasks performed    Balance  Balance Balance Assessed: Yes Dynamic Standing Balance Dynamic Standing - Level of Assistance: 4: Min assist  End of Session PT - End of Session Equipment Utilized During Treatment: Gait belt Activity Tolerance: Patient tolerated treatment well Patient left: in chair;with call  bell/phone  within reach Nurse Communication: Mobility status   GP     Shannon [name] 02/08/2012, 12:53 PM

## 2012-02-08 NOTE — Progress Notes (Signed)
Occupational Therapy Treatment Patient Details Name: Shannon [name] MRN: [id number] DOB: 08-03-1945 Today's Date: 02/08/2012 Time: 4098-1191 OT Time Calculation (min): 17 min  OT Assessment / Plan / Recommendation Comments on Treatment Session Pt tolerated session well although she does report feeling slightly woozy still. She needs min assist for safety with balance and had one LOB today requiring min assist to recover. Planning for d/c today.    Follow Up Recommendations  Home health OT;Supervision/Assistance - 24 hour    Barriers to Discharge       Equipment Recommendations  3 in 1 bedside comode    Recommendations for Other Services    Frequency Min 2X/week   Plan Discharge plan remains appropriate    Precautions / Restrictions Precautions Precautions: Fall;Knee Knee Immobilizer - Right: Other (comment) (pt able to perform SLR) Restrictions Weight Bearing Restrictions: No Other Position/Activity Restrictions: WBAT        ADL  Grooming: Performed;Wash/dry hands;Minimal assistance Where Assessed - Grooming: Unsupported standing Toilet Transfer: Performed;Minimal Web designer: Raised toilet seat with arms (or 3-in-1 over toilet) Toileting - Clothing Manipulation and Hygiene: Performed;Minimal assistance Where Assessed - Toileting Clothing Manipulation and Hygiene: Sit to stand from 3-in-1 or toilet ADL Comments: Pt contiues to be unsteady at times when up on feet. She had a LOB posteriorly transferring into the bathroom with her RW, requiring min assist to regain balance. Pt tends to push RW too far in front of her at times and lifts walker off the floor. Also cautioned pt to be aware of RW legs getting tangled with 3in1 legs. She required cues to steer RW around the 3in1 legs. She declined practicing shower transfer and states she will sponge a few more days and let [facility] assess shower transfer. She is still unsteady and slightly woozy and feel this is a  good decision to let [facility] assess.     OT Diagnosis:    OT Problem List:   OT Treatment Interventions:     OT Goals ADL Goals ADL Goal: Grooming - Progress: Progressing toward goals ADL Goal: Toilet Transfer - Progress: Progressing toward goals ADL Goal: Toileting - Clothing Manipulation - Progress: Progressing toward goals ADL Goal: Toileting - Hygiene - Progress: Progressing toward goals  Visit Information  Last OT Received On: 02/08/12 Assistance Needed: +1    Subjective Data  Subjective: I have already been up and dressed this morning Patient Stated Goal: home   Prior Functioning       Cognition  Overall Cognitive Status: Appears within functional limits for tasks assessed/performed Arousal/Alertness: Awake/alert Orientation Level: Appears intact for tasks assessed Behavior During Session: [facility] for tasks performed    Mobility  Shoulder Instructions Bed Mobility Bed Mobility: Supine to Sit Supine to Sit: 5: Supervision;HOB elevated Transfers Transfers: Sit to Stand;Stand to Sit Sit to Stand: 4: Min guard;With upper extremity assist;From bed;From chair/3-in-1 Stand to Sit: 4: Min guard;With upper extremity assist;To chair/3-in-1 Details for Transfer Assistance: verbal cues for hand placement and R LE out in front. needs min guard assist for safety as she is still unsteady.       Exercises      Balance Balance Balance Assessed: Yes Dynamic Standing Balance Dynamic Standing - Level of Assistance: 4: Min assist   End of Session OT - End of Session Activity Tolerance: Patient tolerated treatment well Patient left: in chair;with call bell/phone within reach  GO     Lennox Laity [phone] 02/08/2012, 9:20 AM

## 2012-02-08 NOTE — Progress Notes (Signed)
Subjective: 3 Days Post-Op Procedure(s) (LRB): TOTAL KNEE ARTHROPLASTY (Right) Patient reports pain as mild.   Patient seen in rounds with Dr. Lequita Halt. Patient is well, and has had no acute complaints or problems Patient is ready to go home later today.  Patient was set up to possibly go yesterday but was not ready.   Objective: Vital signs in last 24 hours: Temp:  [97.8 F (36.6 C)-98.5 F (36.9 C)] 98 F (36.7 C) (01/09 0500) Pulse Rate:  [80-92] 82  (01/09 0500) Resp:  [16] 16  (01/09 0500) BP: (102-137)/(69-83) 137/79 mmHg (01/09 0500) SpO2:  [92 %-98 %] 92 % (01/09 0500)  Intake/Output from previous day:  Intake/Output Summary (Last 24 hours) at 02/08/12 0758 Last data filed at 02/08/12 0500  Gross per 24 hour  Intake   1080 ml  Output      0 ml  Net   1080 ml     Labs:  Basename 02/08/12 0429 02/07/12 0540 02/06/12 0535  HGB 8.3* 8.5* 10.2*    Basename 02/08/12 0429 02/07/12 0540  WBC 10.9* 14.7*  RBC 2.90* 2.90*  HCT 26.0* 25.8*  PLT 226 235    Basename 02/07/12 0540 02/06/12 0535  NA 133* 134*  K 4.0 4.5  CL 97 99  CO2 28 28  BUN 22 13  CREATININE 0.98 0.88  GLUCOSE 141* 163*  CALCIUM 9.0 8.9   No results found for this basename: LABPT:2,INR:2 in the last 72 hours  EXAM: General - Patient is Alert, Appropriate and Oriented Extremity - Neurovascular intact Sensation intact distally Dorsiflexion/Plantar flexion intact No cellulitis present Incision - clean, dry, no drainage, healing Motor Function - intact, moving foot and toes well on exam.   Assessment/Plan: 3 Days Post-Op Procedure(s) (LRB): TOTAL KNEE ARTHROPLASTY (Right) Procedure(s) (LRB): TOTAL KNEE ARTHROPLASTY (Right) Past Medical History  Diagnosis Date  . NICM (nonischemic cardiomyopathy) 03/08/2010    likely 2/2 chemotx - a. Echo 2012: EF 45-50%;  b. Lex MV 2/12:  low risk, apical defect (small area of ischemia vs shifting breast atten);  c.  Echo 7/12: Normal wall thickness,  EF 60-65%, normal wall motion, grade 1 diastolic dysfunction, mild LAE, PASP 32;   d. Lex MV 11/13:  EF 76%, no ischemia  . GERD 08/11/2008  . HYPERLIPIDEMIA 08/11/2008  . HYPERTENSION 08/11/2008  . LIPOMA 02/10/2009  . PREDIABETES 06/15/2009  . 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 Carpal tunnel syndrome of left wrist   . Neuropathy   . Interstitial cystitis   . Brain tumor   . DEPRESSION 08/11/2008  . 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 Osteoporosis   . Hyponatremia 08/28/2011  . Anemia 08/28/2011  . History of chicken pox   . H/O measles   . H/O mumps   . Dermatitis   . OA (osteoarthritis) 09/07/2011  . Melanoma 2008    DR. Yetta Barre  . Shortness of breath   . Asthma   . History of blood transfusion    Principal Problem:  *OA (osteoarthritis) of knee Active Problems:  Hyponatremia  Postop Anemia  Estimated Body mass index is 37.73 kg/(m^2) as calculated from the following:   Height as of this encounter: 5\' 3" (1.6 m).   Weight as of this encounter: 213 lb(96.616 kg). Up with therapy Discharge home with home health Diet - Cardiac diet Follow up - in 2 weeks on  Tuesday the 21st. Activity - WBAT Disposition - Home Condition Upon Discharge - Good D/C Meds - See DC Summary DVT Prophylaxis - Aspirin 325 mg Twice a day   PERKINS, ALEXZANDREW 02/08/2012, 7:58 AM

## 2012-02-08 NOTE — Progress Notes (Signed)
Patient, RN and Husband reviewed discharge instructions and meds. All questions answered and patient verbalized understanding. Currently: VSS, Dressing dry and intact, TED hose on. Pain level 2/10. Discharged to home with Husband by Nurse Tech via wheelchair. Kinslei Labine [place], [place] 02/08/2012 10:59 AM

## 2012-02-10 ENCOUNTER — Other Ambulatory Visit: Payer: Self-pay | Admitting: Family Medicine

## 2012-02-10 DIAGNOSIS — Z471 Aftercare following joint replacement surgery: Secondary | ICD-10-CM | POA: Diagnosis not present

## 2012-02-10 DIAGNOSIS — Z96659 Presence of unspecified artificial knee joint: Secondary | ICD-10-CM | POA: Diagnosis not present

## 2012-02-10 DIAGNOSIS — IMO0001 Reserved for inherently not codable concepts without codable children: Secondary | ICD-10-CM | POA: Diagnosis not present

## 2012-02-10 DIAGNOSIS — Z4801 Encounter for change or removal of surgical wound dressing: Secondary | ICD-10-CM | POA: Diagnosis not present

## 2012-02-10 DIAGNOSIS — D62 Acute posthemorrhagic anemia: Secondary | ICD-10-CM | POA: Diagnosis not present

## 2012-02-11 ENCOUNTER — Encounter (HOSPITAL_COMMUNITY): Payer: Self-pay | Admitting: *Deleted

## 2012-02-11 ENCOUNTER — Emergency Department (HOSPITAL_COMMUNITY): Payer: Medicare Other

## 2012-02-11 ENCOUNTER — Emergency Department (HOSPITAL_COMMUNITY)
Admission: EM | Admit: 2012-02-11 | Discharge: 2012-02-11 | Disposition: A | Discharge status: Home / Self Care | Payer: Medicare Other | Attending: Emergency Medicine | Admitting: Emergency Medicine

## 2012-02-11 DIAGNOSIS — Z79899 Other long term (current) drug therapy: Secondary | ICD-10-CM | POA: Diagnosis not present

## 2012-02-11 DIAGNOSIS — E871 Hypo-osmolality and hyponatremia: Secondary | ICD-10-CM | POA: Diagnosis not present

## 2012-02-11 DIAGNOSIS — M1990 Unspecified osteoarthritis, unspecified site: Secondary | ICD-10-CM | POA: Insufficient documentation

## 2012-02-11 DIAGNOSIS — Z8739 Personal history of other diseases of the musculoskeletal system and connective tissue: Secondary | ICD-10-CM | POA: Insufficient documentation

## 2012-02-11 DIAGNOSIS — R7309 Other abnormal glucose: Secondary | ICD-10-CM | POA: Insufficient documentation

## 2012-02-11 DIAGNOSIS — Z96659 Presence of unspecified artificial knee joint: Secondary | ICD-10-CM | POA: Insufficient documentation

## 2012-02-11 DIAGNOSIS — Z853 Personal history of malignant neoplasm of breast: Secondary | ICD-10-CM | POA: Insufficient documentation

## 2012-02-11 DIAGNOSIS — F329 Major depressive disorder, single episode, unspecified: Secondary | ICD-10-CM | POA: Diagnosis not present

## 2012-02-11 DIAGNOSIS — Z862 Personal history of diseases of the blood and blood-forming organs and certain disorders involving the immune mechanism: Secondary | ICD-10-CM | POA: Insufficient documentation

## 2012-02-11 DIAGNOSIS — R55 Syncope and collapse: Secondary | ICD-10-CM | POA: Diagnosis not present

## 2012-02-11 DIAGNOSIS — J45909 Unspecified asthma, uncomplicated: Secondary | ICD-10-CM | POA: Insufficient documentation

## 2012-02-11 DIAGNOSIS — D443 Neoplasm of uncertain behavior of pituitary gland: Secondary | ICD-10-CM | POA: Diagnosis not present

## 2012-02-11 DIAGNOSIS — F3289 Other specified depressive episodes: Secondary | ICD-10-CM | POA: Insufficient documentation

## 2012-02-11 DIAGNOSIS — R404 Transient alteration of awareness: Secondary | ICD-10-CM | POA: Diagnosis not present

## 2012-02-11 DIAGNOSIS — M810 Age-related osteoporosis without current pathological fracture: Secondary | ICD-10-CM | POA: Insufficient documentation

## 2012-02-11 DIAGNOSIS — K219 Gastro-esophageal reflux disease without esophagitis: Secondary | ICD-10-CM | POA: Diagnosis not present

## 2012-02-11 DIAGNOSIS — N3010 Interstitial cystitis (chronic) without hematuria: Secondary | ICD-10-CM | POA: Diagnosis not present

## 2012-02-11 DIAGNOSIS — R51 Headache: Secondary | ICD-10-CM | POA: Diagnosis not present

## 2012-02-11 DIAGNOSIS — Z85820 Personal history of malignant melanoma of skin: Secondary | ICD-10-CM | POA: Diagnosis not present

## 2012-02-11 DIAGNOSIS — Z7982 Long term (current) use of aspirin: Secondary | ICD-10-CM | POA: Insufficient documentation

## 2012-02-11 DIAGNOSIS — G589 Mononeuropathy, unspecified: Secondary | ICD-10-CM | POA: Diagnosis not present

## 2012-02-11 DIAGNOSIS — R142 Eructation: Secondary | ICD-10-CM | POA: Diagnosis not present

## 2012-02-11 DIAGNOSIS — I10 Essential (primary) hypertension: Secondary | ICD-10-CM | POA: Diagnosis not present

## 2012-02-11 DIAGNOSIS — Z86011 Personal history of benign neoplasm of the brain: Secondary | ICD-10-CM | POA: Insufficient documentation

## 2012-02-11 DIAGNOSIS — E785 Hyperlipidemia, unspecified: Secondary | ICD-10-CM | POA: Insufficient documentation

## 2012-02-11 DIAGNOSIS — R141 Gas pain: Secondary | ICD-10-CM | POA: Diagnosis not present

## 2012-02-11 DIAGNOSIS — R143 Flatulence: Secondary | ICD-10-CM | POA: Diagnosis not present

## 2012-02-11 DIAGNOSIS — I428 Other cardiomyopathies: Secondary | ICD-10-CM | POA: Diagnosis not present

## 2012-02-11 LAB — GLUCOSE, CAPILLARY: Glucose-Capillary: 114 mg/dL — ABNORMAL HIGH (ref 70–99)

## 2012-02-11 LAB — BASIC METABOLIC PANEL
Chloride: 94 mEq/L — ABNORMAL LOW (ref 96–112)
Creatinine, Ser: 1.09 mg/dL (ref 0.50–1.10)
GFR calc Af Amer: 59 mL/min — ABNORMAL LOW (ref >90)
GFR calc non Af Amer: 51 mL/min — ABNORMAL LOW (ref >90)
Potassium: 3.9 mEq/L (ref 3.5–5.1)

## 2012-02-11 LAB — CBC
MCHC: 32.2 g/dL (ref 30.0–36.0)
Platelets: 266 10*3/uL (ref 150–400)
RDW: 14.1 % (ref 11.5–15.5)
WBC: 12.6 10*3/uL — ABNORMAL HIGH (ref 4.0–10.5)

## 2012-02-11 LAB — TROPONIN I: Troponin I: <0.3 ng/mL (ref <0.30)

## 2012-02-11 MED ORDER — FENTANYL CITRATE 0.05 MG/ML IJ SOLN
50.0000 ug | Freq: Once | INTRAMUSCULAR | Status: AC
  Administered 2012-02-11: 50 ug via INTRAVENOUS
  Filled 2012-02-11: qty 2

## 2012-02-11 MED ORDER — SODIUM CHLORIDE 0.9 % IV SOLN
Freq: Once | INTRAVENOUS | Status: AC
  Administered 2012-02-11: 15:00:00 via INTRAVENOUS

## 2012-02-11 NOTE — ED Notes (Addendum)
Pt notified that urine sample is needed.  Will notify when able to produce.

## 2012-02-11 NOTE — ED Notes (Signed)
Pt requesting pain intervention.  Notified RN

## 2012-02-11 NOTE — ED Notes (Signed)
Pt able to use bedside commode but was not able to catch clean urine for sample.

## 2012-02-11 NOTE — ED Notes (Signed)
AVW:[id number]<XB> Expected date:<BR> Expected time:<BR> Means of arrival:<BR> Comments:<BR> Syncopal/post op/BP 220/110

## 2012-02-11 NOTE — ED Provider Notes (Signed)
History     CSN: [id number]  Arrival date & time 02/11/12  1427   First MD Initiated Contact with Patient 02/11/12 1520      Chief Complaint  Patient presents with  . Loss of Consciousness  . post op knee replacement      HPI Per ems pt had total right knee replacement on Monday. Pt d/c home on Thursday. Yesterday pt was acting confused, family believed due to pain medication. Today pt was "having conversation when no one was in the room" reports daughter. Husband reports pt was in bathroom trying to have bowel movement and lost consciousness. Pt did not fall, denies head. Husband found pt passed out. Slid her to the floor. Pt reports right leg pain 8/10  Past Medical History  Diagnosis Date  . NICM (nonischemic cardiomyopathy) 03/08/2010    likely 2/2 chemotx - a. Echo 2012: EF 45-50%;  b. Lex MV 2/12:  low risk, apical defect (small area of ischemia vs shifting breast atten);  c.  Echo 7/12: Normal wall thickness, EF 60-65%, normal wall motion, grade 1 diastolic dysfunction, mild LAE, PASP 32;   d. Lex MV 11/13:  EF 76%, no ischemia  . GERD 08/11/2008  . HYPERLIPIDEMIA 08/11/2008  . HYPERTENSION 08/11/2008  . LIPOMA 02/10/2009  . PREDIABETES 06/15/2009  . 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 Carpal tunnel syndrome of left wrist   . Neuropathy   . Interstitial cystitis   . Brain tumor   . DEPRESSION 08/11/2008  . 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 Osteoporosis   . Hyponatremia 08/28/2011  . Anemia 08/28/2011  . History of chicken pox   . H/O measles   . H/O mumps   . Dermatitis   . OA (osteoarthritis) 09/07/2011  . Melanoma 2008    DR. Yetta Barre  . Shortness of breath   . Asthma   . History of blood transfusion     Past Surgical History  Procedure Date  . Cholecystectomy   . Carpal tunnel release     L wrist, ulnar nerve moved  . Elbow surgery     left  . Tubal ligation 1997  . Lipoma  excision 03/28/2009    right leg  . Portacath placement may 2011  . Craniotomy for tumor 2000  . Tonsillectomy 1958  . Melanoma excision   . Porta cath   . Port-a-cath removal 11/30/2010    Streck  . Breast lumpectomy 04/2009    RIGHT FOR BREAST CANCER-CHEMO/RADIATION X 1 YEAR  . Total knee raplacement 01-2012    Right Knee  . Total knee arthroplasty 02/05/2012    Procedure: TOTAL KNEE ARTHROPLASTY;  Surgeon: Loanne Drilling, MD;  Location: [facility];  Service: Orthopedics;  Laterality: Right;    Family History  Problem Relation Age of Onset  . Breast cancer Mother   . Lung cancer Mother   . Cancer Mother     sarcoma, breast  . Hypertension Mother   . Colon cancer Neg Hx   . Prostate cancer Father   . Heart disease Father     chf  . Cancer Father     prostate  . Prostate cancer Brother   . Cancer Brother     PROSTATE  . Stomach cancer Maternal Aunt   . Uterine cancer Maternal Aunt   . Down syndrome Son   . Heart disease  Maternal Grandfather     MI  . Heart attack Father     History  Substance Use Topics  . Smoking status: Never Smoker   . Smokeless tobacco: Never Used  . Alcohol Use: No    OB History    Grav Para Term Preterm Abortions TAB SAB Ect Mult Living   3 3        3       Review of Systems All other systems reviewed and are negative Allergies  Review of patient's allergies indicates no known allergies.  Home Medications   Current Outpatient Rx  Name  Route  Sig  Dispense  Refill  . ASPIRIN 325 MG PO TABS   Oral   Take 1 tablet (325 mg total) by mouth 2 (two) times daily. Take for three weeks.  Once the patient has completed the full dose aspirin twice a day for three weeks, then discontinue the full dose aspirin and resume the 81 mg Aspirin daily.   42 tablet   0   . CARVEDILOL 6.25 MG PO TABS   Oral   Take 6.25 mg by mouth 2 (two) times daily.         Marland Kitchen FERROUS GLUCONATE 324 (38 FE) MG PO TABS   Oral   Take 324 mg by mouth daily with  breakfast.         . FOLIC ACID 1 MG PO TABS   Oral   Take 1 mg by mouth every morning.         Marland Kitchen GABAPENTIN 300 MG PO CAPS   Oral   Take 300 mg by mouth at bedtime.         . GUAIFENESIN ER 600 MG PO TB12   Oral   Take 600 mg by mouth every 12 (twelve) hours as needed. For congestion         . HYDROCHLOROTHIAZIDE 25 MG PO TABS   Oral   Take 25 mg by mouth every morning.         Marland Kitchen HYDROCODONE-ACETAMINOPHEN 7.5-325 MG PO TABS   Oral   Take 1 tablet by mouth every 6 (six) hours as needed. pain         . HYDROXYZINE HCL 25 MG PO TABS   Oral   Take 25 mg by mouth at bedtime.         Marland Kitchen LORAZEPAM 0.5 MG PO TABS   Oral   Take 1 tablet (0.5 mg total) by mouth at bedtime.   90 tablet   2   . METHOCARBAMOL 500 MG PO TABS   Oral   Take 1 tablet (500 mg total) by mouth every 6 (six) hours as needed.   80 tablet   0   . OMEPRAZOLE 40 MG PO CPDR   Oral   Take 40 mg by mouth every morning.         Marland Kitchen PENTOSAN POLYSULFATE SODIUM 100 MG PO CAPS   Oral   Take 2 capsules (200 mg total) by mouth 2 (two) times daily.   60 capsule   0   . PSYLLIUM 28 % PO PACK   Oral   Take 1 packet by mouth daily.         Marland Kitchen ROSUVASTATIN CALCIUM 20 MG PO TABS   Oral   Take 20 mg by mouth at bedtime.         . SUCRALFATE 1 G PO TABS   Oral   Take 1 g by mouth daily  as needed.         . TELMISARTAN 80 MG PO TABS   Oral   Take 80 mg by mouth every morning.         . VENLAFAXINE HCL ER 75 MG PO CP24   Oral   Take 75 mg by mouth at bedtime.           BP 115/55  Pulse 80  Temp 98 F (36.7 C) (Oral)  Resp 17  SpO2 99%  LMP 01/30/1994  Physical Exam  Nursing note and vitals reviewed. Constitutional: She is oriented to person, place, and time. She appears well-developed and well-nourished. No distress.  HENT:  Head: Normocephalic and atraumatic.  Eyes: Pupils are equal, round, and reactive to light.  Neck: Normal range of motion.  Cardiovascular: Normal  rate and intact distal pulses.   Pulmonary/Chest: No respiratory distress.  Abdominal: Normal appearance. She exhibits no distension.  Musculoskeletal:       Legs: Neurological: She is alert and oriented to person, place, and time. No cranial nerve deficit or sensory deficit. GCS eye subscore is 4. GCS verbal subscore is 5. GCS motor subscore is 6.  Skin: Skin is warm and dry. No rash noted.  Psychiatric: She has a normal mood and affect. Her behavior is normal.    ED Course  Procedures (including critical care time)  Date: 02/11/2012  Rate: 74  Rhythm: normal sinus rhythm  QRS Axis: Borderline left axis  Intervals: normal  ST/T Wave abnormalities: Borderline T wave abnormality   Conduction Disutrbances: none  Narrative Interpretation: Borderline EKG     Labs Reviewed  GLUCOSE, CAPILLARY - Abnormal; Notable for the following:    Glucose-Capillary 114 (*)     All other components within normal limits  CBC - Abnormal; Notable for the following:    WBC 12.6 (*)     RBC 3.03 (*)     Hemoglobin 8.8 (*)     HCT 27.3 (*)     All other components within normal limits  BASIC METABOLIC PANEL - Abnormal; Notable for the following:    Sodium 131 (*)     Chloride 94 (*)     Glucose, Bld 122 (*)     GFR calc non Af Amer 51 (*)     GFR calc Af Amer 59 (*)     All other components within normal limits  TROPONIN I  URINALYSIS, ROUTINE W REFLEX MICROSCOPIC   Dg Abd 1 View  02/11/2012  *RADIOLOGY REPORT*  Clinical Data: Abdominal pain, distention, syncope.  ABDOMEN - 1 VIEW  Comparison: PET CT 05/24/2009  Findings: Vascular clips in the right lower quadrant and in the gallbladder fossa.  The small bowel decompressed.  Normal bowel gas pattern.  No pneumatosis.  Mild degenerative changes in the lower lumbar spine.  Visualized lung bases clear.  IMPRESSION:  1.  Normal bowel gas pattern.   Original Report Authenticated By: D. Andria Rhein, MD    Ct Head Wo Contrast  02/11/2012  *RADIOLOGY  REPORT*  Clinical Data: head pain  CT HEAD WITHOUT CONTRAST  Technique:  Contiguous axial images were obtained from the base of the skull through the vertex without contrast.  Comparison: 08/09/2011  Findings: The brain has a normal appearance without evidence for hemorrhage, infarction, hydrocephalus, or mass lesion.  There is no extra axial fluid collection.  The skull appears normal.  There is a right frontal craniotomy defect.  IMPRESSION:  1.  No acute intracranial abnormalities. 2.  Previous right frontal craniotomy.   Original Report Authenticated By: Signa Kell, M.D.    Dg Chest Portable 1 View  02/11/2012  *RADIOLOGY REPORT*  Clinical Data: Loss of consciousness.  PORTABLE CHEST - 1 VIEW  Comparison: 09/16/2011 and a chest CT dated 10/03/2011   Findings: Heart size and vascularity are normal and the lungs are clear.  Chronic elevation of the right hemidiaphragm, unchanged since the prior chest CT.  IMPRESSION: No acute disease.   Original Report Authenticated By: Francene Boyers, M.D.      1. Vasovagal syncope       MDM         Nelia Shi, MD 02/11/12 [phone]

## 2012-02-11 NOTE — ED Notes (Signed)
Pt to xray

## 2012-02-11 NOTE — ED Notes (Signed)
Per ems pt had total right knee replacement on Monday. Pt d/c home on Thursday. Yesterday pt was acting confused, family believed due to pain medication. Today pt was "having conversation when no one was in the room" reports daughter. Husband reports pt was in bathroom trying to have bowel movement and lost consciousness. Pt did not fall, denies head. Husband found pt passed out. Slid her to the floor. Pt reports right leg pain 8/10

## 2012-02-11 NOTE — ED Notes (Signed)
md at bedside

## 2012-02-11 NOTE — ED Notes (Signed)
Pt escorted to discharge window. Pt verbalized understanding discharge instructions. In no acute distress.  

## 2012-02-12 DIAGNOSIS — IMO0001 Reserved for inherently not codable concepts without codable children: Secondary | ICD-10-CM | POA: Diagnosis not present

## 2012-02-12 DIAGNOSIS — Z4801 Encounter for change or removal of surgical wound dressing: Secondary | ICD-10-CM | POA: Diagnosis not present

## 2012-02-12 DIAGNOSIS — Z96659 Presence of unspecified artificial knee joint: Secondary | ICD-10-CM | POA: Diagnosis not present

## 2012-02-12 DIAGNOSIS — D62 Acute posthemorrhagic anemia: Secondary | ICD-10-CM | POA: Diagnosis not present

## 2012-02-12 DIAGNOSIS — Z471 Aftercare following joint replacement surgery: Secondary | ICD-10-CM | POA: Diagnosis not present

## 2012-02-13 DIAGNOSIS — Z96659 Presence of unspecified artificial knee joint: Secondary | ICD-10-CM | POA: Diagnosis not present

## 2012-02-13 DIAGNOSIS — Z471 Aftercare following joint replacement surgery: Secondary | ICD-10-CM | POA: Diagnosis not present

## 2012-02-13 DIAGNOSIS — Z4801 Encounter for change or removal of surgical wound dressing: Secondary | ICD-10-CM | POA: Diagnosis not present

## 2012-02-13 DIAGNOSIS — IMO0001 Reserved for inherently not codable concepts without codable children: Secondary | ICD-10-CM | POA: Diagnosis not present

## 2012-02-13 DIAGNOSIS — D62 Acute posthemorrhagic anemia: Secondary | ICD-10-CM | POA: Diagnosis not present

## 2012-02-15 DIAGNOSIS — D62 Acute posthemorrhagic anemia: Secondary | ICD-10-CM | POA: Diagnosis not present

## 2012-02-15 DIAGNOSIS — IMO0001 Reserved for inherently not codable concepts without codable children: Secondary | ICD-10-CM | POA: Diagnosis not present

## 2012-02-15 DIAGNOSIS — Z4801 Encounter for change or removal of surgical wound dressing: Secondary | ICD-10-CM | POA: Diagnosis not present

## 2012-02-15 DIAGNOSIS — Z471 Aftercare following joint replacement surgery: Secondary | ICD-10-CM | POA: Diagnosis not present

## 2012-02-15 DIAGNOSIS — Z96659 Presence of unspecified artificial knee joint: Secondary | ICD-10-CM | POA: Diagnosis not present

## 2012-02-19 DIAGNOSIS — D62 Acute posthemorrhagic anemia: Secondary | ICD-10-CM | POA: Diagnosis not present

## 2012-02-19 DIAGNOSIS — Z471 Aftercare following joint replacement surgery: Secondary | ICD-10-CM | POA: Diagnosis not present

## 2012-02-19 DIAGNOSIS — Z4801 Encounter for change or removal of surgical wound dressing: Secondary | ICD-10-CM | POA: Diagnosis not present

## 2012-02-19 DIAGNOSIS — IMO0001 Reserved for inherently not codable concepts without codable children: Secondary | ICD-10-CM | POA: Diagnosis not present

## 2012-02-19 DIAGNOSIS — Z96659 Presence of unspecified artificial knee joint: Secondary | ICD-10-CM | POA: Diagnosis not present

## 2012-02-20 DIAGNOSIS — D62 Acute posthemorrhagic anemia: Secondary | ICD-10-CM | POA: Diagnosis not present

## 2012-02-20 DIAGNOSIS — Z471 Aftercare following joint replacement surgery: Secondary | ICD-10-CM | POA: Diagnosis not present

## 2012-02-20 DIAGNOSIS — IMO0001 Reserved for inherently not codable concepts without codable children: Secondary | ICD-10-CM | POA: Diagnosis not present

## 2012-02-20 DIAGNOSIS — Z4801 Encounter for change or removal of surgical wound dressing: Secondary | ICD-10-CM | POA: Diagnosis not present

## 2012-02-20 DIAGNOSIS — Z96659 Presence of unspecified artificial knee joint: Secondary | ICD-10-CM | POA: Diagnosis not present

## 2012-02-21 DIAGNOSIS — D62 Acute posthemorrhagic anemia: Secondary | ICD-10-CM | POA: Diagnosis not present

## 2012-02-21 DIAGNOSIS — Z96659 Presence of unspecified artificial knee joint: Secondary | ICD-10-CM | POA: Diagnosis not present

## 2012-02-21 DIAGNOSIS — Z471 Aftercare following joint replacement surgery: Secondary | ICD-10-CM | POA: Diagnosis not present

## 2012-02-21 DIAGNOSIS — IMO0001 Reserved for inherently not codable concepts without codable children: Secondary | ICD-10-CM | POA: Diagnosis not present

## 2012-02-21 DIAGNOSIS — Z4801 Encounter for change or removal of surgical wound dressing: Secondary | ICD-10-CM | POA: Diagnosis not present

## 2012-02-22 DIAGNOSIS — Z471 Aftercare following joint replacement surgery: Secondary | ICD-10-CM | POA: Diagnosis not present

## 2012-02-22 DIAGNOSIS — Z4801 Encounter for change or removal of surgical wound dressing: Secondary | ICD-10-CM | POA: Diagnosis not present

## 2012-02-22 DIAGNOSIS — Z96659 Presence of unspecified artificial knee joint: Secondary | ICD-10-CM | POA: Diagnosis not present

## 2012-02-22 DIAGNOSIS — D62 Acute posthemorrhagic anemia: Secondary | ICD-10-CM | POA: Diagnosis not present

## 2012-02-22 DIAGNOSIS — IMO0001 Reserved for inherently not codable concepts without codable children: Secondary | ICD-10-CM | POA: Diagnosis not present

## 2012-02-23 DIAGNOSIS — IMO0001 Reserved for inherently not codable concepts without codable children: Secondary | ICD-10-CM | POA: Diagnosis not present

## 2012-02-23 DIAGNOSIS — Z4801 Encounter for change or removal of surgical wound dressing: Secondary | ICD-10-CM | POA: Diagnosis not present

## 2012-02-23 DIAGNOSIS — Z471 Aftercare following joint replacement surgery: Secondary | ICD-10-CM | POA: Diagnosis not present

## 2012-02-23 DIAGNOSIS — D62 Acute posthemorrhagic anemia: Secondary | ICD-10-CM | POA: Diagnosis not present

## 2012-02-23 DIAGNOSIS — Z96659 Presence of unspecified artificial knee joint: Secondary | ICD-10-CM | POA: Diagnosis not present

## 2012-02-25 ENCOUNTER — Other Ambulatory Visit: Payer: Self-pay | Admitting: Family Medicine

## 2012-02-26 DIAGNOSIS — M1610 Unilateral primary osteoarthritis, unspecified hip: Secondary | ICD-10-CM | POA: Diagnosis not present

## 2012-03-01 ENCOUNTER — Encounter: Payer: Self-pay | Admitting: Family Medicine

## 2012-03-01 ENCOUNTER — Ambulatory Visit (INDEPENDENT_AMBULATORY_CARE_PROVIDER_SITE_OTHER): Payer: Medicare Other | Admitting: Family Medicine

## 2012-03-01 VITALS — BP 132/84 | HR 83 | Temp 97.7°F | Ht 63.0 in | Wt 203.8 lb

## 2012-03-01 DIAGNOSIS — I10 Essential (primary) hypertension: Secondary | ICD-10-CM

## 2012-03-01 DIAGNOSIS — D72829 Elevated white blood cell count, unspecified: Secondary | ICD-10-CM

## 2012-03-01 DIAGNOSIS — R350 Frequency of micturition: Secondary | ICD-10-CM | POA: Diagnosis not present

## 2012-03-01 DIAGNOSIS — N390 Urinary tract infection, site not specified: Secondary | ICD-10-CM | POA: Diagnosis not present

## 2012-03-01 DIAGNOSIS — K5900 Constipation, unspecified: Secondary | ICD-10-CM

## 2012-03-01 LAB — POCT URINALYSIS DIPSTICK
Blood, UA: NEGATIVE
Glucose, UA: NEGATIVE
Nitrite, UA: NEGATIVE
Protein, UA: 30
Urobilinogen, UA: 1

## 2012-03-01 MED ORDER — CIPROFLOXACIN HCL 250 MG PO TABS: 250.0000 mg | ORAL_TABLET | Freq: Two times a day (BID) | ORAL | Status: DC

## 2012-03-01 MED ORDER — PHENAZOPYRIDINE HCL 100 MG PO TABS: 100.0000 mg | ORAL_TABLET | Freq: Three times a day (TID) | ORAL | Status: DC | PRN

## 2012-03-01 NOTE — Patient Instructions (Addendum)
Change from colace to pericolace (Docusate and Senna) 1-2 daily Increase fiber supplement to twice a day Consider change probiotic to Digestive Advantage probiotics by Schiff  If no BM in 2 days take 4 oz of warm prune juice and add 2 tbls of MOM by mouth if no results in 4 hours or longer can repeat and can do a Dulcolax supp PR at the same time and repeat again in 4 + hours as needed   Constipation, Adult Constipation is when a person has fewer than 3 bowel movements a week; has difficulty having a bowel movement; or has stools that are dry, hard, or larger than normal. As people grow older, constipation is more common. If you try to fix constipation with medicines that make you have a bowel movement (laxatives), the problem may get worse. Long-term laxative use may cause the muscles of the colon to become weak. A low-fiber diet, not taking in enough fluids, and taking certain medicines may make constipation worse. CAUSES   Certain medicines, such as antidepressants, pain medicine, iron supplements, antacids, and water pills.   Certain diseases, such as diabetes, irritable bowel syndrome (IBS), thyroid disease, or depression.   Not drinking enough water.   Not eating enough fiber-rich foods.   Stress or travel.  Lack of physical activity or exercise.  Not going to the restroom when there is the urge to have a bowel movement.  Ignoring the urge to have a bowel movement.  Using laxatives too much. SYMPTOMS   Having fewer than 3 bowel movements a week.   Straining to have a bowel movement.   Having hard, dry, or larger than normal stools.   Feeling full or bloated.   Pain in the lower abdomen.  Not feeling relief after having a bowel movement. DIAGNOSIS  Your caregiver will take a medical history and perform a physical exam. Further testing may be done for severe constipation. Some tests may include:   A barium enema X-ray to examine your rectum, colon, and  sometimes, your small intestine.  A sigmoidoscopy to examine your lower colon.  A colonoscopy to examine your entire colon. TREATMENT  Treatment will depend on the severity of your constipation and what is causing it. Some dietary treatments include drinking more fluids and eating more fiber-rich foods. Lifestyle treatments may include regular exercise. If these diet and lifestyle recommendations do not help, your caregiver may recommend taking over-the-counter laxative medicines to help you have bowel movements. Prescription medicines may be prescribed if over-the-counter medicines do not work.  HOME CARE INSTRUCTIONS   Increase dietary fiber in your diet, such as fruits, vegetables, whole grains, and beans. Limit high-fat and processed sugars in your diet, such as [place] fries, hamburgers, cookies, candies, and soda.   A fiber supplement may be added to your diet if you cannot get enough fiber from foods.   Drink enough fluids to keep your urine clear or pale yellow.   Exercise regularly or as directed by your caregiver.   Go to the restroom when you have the urge to go. Do not hold it.  Only take medicines as directed by your caregiver. Do not take other medicines for constipation without talking to your caregiver first. SEEK IMMEDIATE MEDICAL CARE IF:   You have bright red blood in your stool.   Your constipation lasts for more than 4 days or gets worse.   You have abdominal or rectal pain.   You have thin, pencil-like stools.  You have unexplained weight  loss. MAKE SURE YOU:   Understand these instructions.  Will watch your condition.  Will get help right away if you are not doing well or get worse. Document Released: 10/15/2003 Document Revised: 04/10/2011 Document Reviewed: 12/20/2010 [facility] Patient Information 2013 [place], [place].  Urinary Tract Infection Urinary tract infections (UTIs) can develop anywhere along your urinary tract. Your urinary tract is  your body's drainage system for removing wastes and extra water. Your urinary tract includes two kidneys, two ureters, a bladder, and a urethra. Your kidneys are a pair of bean-shaped organs. Each kidney is about the size of your fist. They are located below your ribs, one on each side of your spine. CAUSES Infections are caused by microbes, which are microscopic organisms, including fungi, viruses, and bacteria. These organisms are so small that they can only be seen through a microscope. Bacteria are the microbes that most commonly cause UTIs. SYMPTOMS  Symptoms of UTIs may vary by age and gender of the patient and by the location of the infection. Symptoms in young women typically include a frequent and intense urge to urinate and a painful, burning feeling in the bladder or urethra during urination. Older women and men are more likely to be tired, shaky, and weak and have muscle aches and abdominal pain. A fever may mean the infection is in your kidneys. Other symptoms of a kidney infection include pain in your back or sides below the ribs, nausea, and vomiting. DIAGNOSIS To diagnose a UTI, your caregiver will ask you about your symptoms. Your caregiver also will ask to provide a urine sample. The urine sample will be tested for bacteria and white blood cells. White blood cells are made by your body to help fight infection. TREATMENT  Typically, UTIs can be treated with medication. Because most UTIs are caused by a bacterial infection, they usually can be treated with the use of antibiotics. The choice of antibiotic and length of treatment depend on your symptoms and the type of bacteria causing your infection. HOME CARE INSTRUCTIONS  If you were prescribed antibiotics, take them exactly as your caregiver instructs you. Finish the medication even if you feel better after you have only taken some of the medication.  Drink enough water and fluids to keep your urine clear or pale yellow.  Avoid  caffeine, tea, and carbonated beverages. They tend to irritate your bladder.  Empty your bladder often. Avoid holding urine for long periods of time.  Empty your bladder before and after sexual intercourse.  After a bowel movement, women should cleanse from front to back. Use each tissue only once. SEEK MEDICAL CARE IF:   You have back pain.  You develop a fever.  Your symptoms do not begin to resolve within 3 days. SEEK IMMEDIATE MEDICAL CARE IF:   You have severe back pain or lower abdominal pain.  You develop chills.  You have nausea or vomiting.  You have continued burning or discomfort with urination. MAKE SURE YOU:   Understand these instructions.  Will watch your condition.  Will get help right away if you are not doing well or get worse. Document Released: 10/26/2004 Document Revised: 07/18/2011 Document Reviewed: 02/24/2011 [facility] Patient Information 2013 [place], [place].

## 2012-03-03 ENCOUNTER — Encounter: Payer: Self-pay | Admitting: Family Medicine

## 2012-03-03 DIAGNOSIS — N390 Urinary tract infection, site not specified: Secondary | ICD-10-CM

## 2012-03-03 DIAGNOSIS — K5900 Constipation, unspecified: Secondary | ICD-10-CM | POA: Insufficient documentation

## 2012-03-03 HISTORY — DX: Urinary tract infection, site not specified: N39.0

## 2012-03-03 HISTORY — DX: Constipation, unspecified: K59.00

## 2012-03-03 LAB — URINE CULTURE

## 2012-03-03 NOTE — Assessment & Plan Note (Signed)
Well controlled, no changes today 

## 2012-03-03 NOTE — Assessment & Plan Note (Signed)
Started on antibiotics, probiotics and cranberry tabs. Increase hydration

## 2012-03-03 NOTE — Assessment & Plan Note (Signed)
Increase fiber supplement to bid, change probiotics, change to Pericolace 1-2 daily if no results after 2-3 days, try MOM with prune juice +/- dulcolax suppository

## 2012-03-03 NOTE — Progress Notes (Signed)
Patient ID: Shannon [name], female   DOB: 1945-03-24, 67 y.o.   MRN: [id number] ARIN VANOSDOL [id number] March 13, 1945 03/03/2012      Progress Note-Follow Up  Subjective  Chief Complaint  Chief Complaint  Patient presents with  . Urinary Tract Infection    lower abdominal pain    HPI  Patient is a 67 year old Caucasian female who is in today complaining of several days worth of urinary symptoms. She has some mild suprapubic pain. She hasn't dysuria frequency and urgency. Denies flank pain, hematuria or fevers. Had a knee replacement on January 6 and has been struggling with constipation ever since. Usually goes every 1-2 days and is now going every 4-5 days. Has started Dr. Janene Madeira that has not been helpful.  Past Medical History  Diagnosis Date  . NICM (nonischemic cardiomyopathy) 03/08/2010    likely 2/2 chemotx - a. Echo 2012: EF 45-50%;  b. Lex MV 2/12:  low risk, apical defect (small area of ischemia vs shifting breast atten);  c.  Echo 7/12: Normal wall thickness, EF 60-65%, normal wall motion, grade 1 diastolic dysfunction, mild LAE, PASP 32;   d. Lex MV 11/13:  EF 76%, no ischemia  . GERD 08/11/2008  . HYPERLIPIDEMIA 08/11/2008  . HYPERTENSION 08/11/2008  . LIPOMA 02/10/2009  . PREDIABETES 06/15/2009  . 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 Carpal tunnel syndrome of left wrist   . Neuropathy   . Interstitial cystitis   . Brain tumor   . DEPRESSION 08/11/2008  . 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 Osteoporosis   . Hyponatremia 08/28/2011  . Anemia 08/28/2011  . History of chicken pox   . H/O measles   . H/O mumps   . Dermatitis   . OA (osteoarthritis) 09/07/2011  . Melanoma 2008    DR. Yetta Barre  . Shortness of breath   . Asthma   . History of blood transfusion   . UTI (lower urinary tract infection) 03/03/2012    Past Surgical History  Procedure Date  . Cholecystectomy   . Carpal tunnel release     L  wrist, ulnar nerve moved  . Elbow surgery     left  . Tubal ligation 1997  . Lipoma excision 03/28/2009    right leg  . Portacath placement may 2011  . Craniotomy for tumor 2000  . Tonsillectomy 1958  . Melanoma excision   . Porta cath   . Port-a-cath removal 11/30/2010    Streck  . Breast lumpectomy 04/2009    RIGHT FOR BREAST CANCER-CHEMO/RADIATION X 1 YEAR  . Total knee raplacement 01-2012    Right Knee  . Total knee arthroplasty 02/05/2012    Procedure: TOTAL KNEE ARTHROPLASTY;  Surgeon: Loanne Drilling, MD;  Location: [facility];  Service: Orthopedics;  Laterality: Right;    Family History  Problem Relation Age of Onset  . Breast cancer Mother   . Lung cancer Mother   . Cancer Mother     sarcoma, breast  . Hypertension Mother   . Colon cancer Neg Hx   . Prostate cancer Father   . Heart disease Father     chf  . Cancer Father     prostate  . Prostate cancer Brother   . Cancer Brother     PROSTATE  . Stomach cancer Maternal Aunt   . Uterine cancer Maternal Aunt   .  Down syndrome Son   . Heart disease Maternal Grandfather     MI  . Heart attack Father     History   Social History  . Marital Status: Married    Spouse Name: N/A    Number of Children: 3  . Years of Education: N/A   Occupational History  . boutique owner    Social History Main Topics  . Smoking status: Never Smoker   . Smokeless tobacco: Never Used  . Alcohol Use: No  . Drug Use: No  . Sexually Active: No   Other Topics Concern  . Not on file   Social History Narrative  . No narrative on file    Current Outpatient Prescriptions on File Prior to Visit  Medication Sig Dispense Refill  . aspirin 325 MG tablet Take 1 tablet (325 mg total) by mouth 2 (two) times daily. Take for three weeks.  Once the patient has completed the full dose aspirin twice a day for three weeks, then discontinue the full dose aspirin and resume the 81 mg Aspirin daily.  42 tablet  0  . carvedilol (COREG) 6.25 MG  tablet Take 6.25 mg by mouth 2 (two) times daily.      . ferrous gluconate (FERGON) 324 MG tablet Take 324 mg by mouth daily with breakfast.      . folic acid (FOLVITE) 1 MG tablet Take 1 mg by mouth every morning.      . gabapentin (NEURONTIN) 300 MG capsule Take 300 mg by mouth at bedtime.      Marland Kitchen guaiFENesin (MUCINEX) 600 MG 12 hr tablet Take 600 mg by mouth every 12 (twelve) hours as needed. For congestion      . hydrochlorothiazide (HYDRODIURIL) 25 MG tablet Take 25 mg by mouth every morning.      Marland Kitchen HYDROcodone-acetaminophen (NORCO) 7.5-325 MG per tablet Take 1 tablet by mouth every 6 (six) hours as needed. pain      . hydrOXYzine (ATARAX/VISTARIL) 25 MG tablet TAKE 1 TABLET BY MOUTH EVERY DAY  30 tablet  0  . LORazepam (ATIVAN) 0.5 MG tablet Take 1 tablet (0.5 mg total) by mouth at bedtime.  90 tablet  2  . methocarbamol (ROBAXIN) 500 MG tablet Take 1 tablet (500 mg total) by mouth every 6 (six) hours as needed.  80 tablet  0  . omeprazole (PRILOSEC) 40 MG capsule Take 40 mg by mouth every morning.      . pentosan polysulfate (ELMIRON) 100 MG capsule Take 2 capsules (200 mg total) by mouth 2 (two) times daily.  60 capsule  0  . psyllium (METAMUCIL SMOOTH TEXTURE) 28 % packet Take 1 packet by mouth daily.      . rosuvastatin (CRESTOR) 20 MG tablet Take 20 mg by mouth at bedtime.      . sucralfate (CARAFATE) 1 G tablet Take 1 g by mouth daily as needed.      . venlafaxine XR (EFFEXOR-XR) 75 MG 24 hr capsule Take 75 mg by mouth at bedtime.        No Known Allergies  Review of Systems  Review of Systems  Constitutional: Negative for fever and malaise/fatigue.  HENT: Negative for congestion.   Eyes: Negative for discharge.  Respiratory: Negative for shortness of breath.   Cardiovascular: Negative for chest pain, palpitations and leg swelling.  Gastrointestinal: Positive for abdominal pain. Negative for nausea and diarrhea.  Genitourinary: Positive for dysuria, urgency and frequency.   Musculoskeletal: Negative for falls.  Skin: Negative  for rash.  Neurological: Negative for loss of consciousness and headaches.  Endo/Heme/Allergies: Negative for polydipsia.  Psychiatric/Behavioral: Negative for depression and suicidal ideas. The patient is not nervous/anxious and does not have insomnia.     Objective  BP 132/84  Pulse 83  Temp 97.7 F (36.5 C) (Temporal)  Ht 5\' 3"  (1.6 m)  Wt 203 lb 12.8 oz (92.443 kg)  BMI 36.10 kg/m2  SpO2 96%  LMP 01/30/1994  Physical Exam  Physical Exam  Constitutional: She is oriented to person, place, and time and well-developed, well-nourished, and in no distress. No distress.  HENT:  Head: Normocephalic and atraumatic.  Eyes: Conjunctivae normal are normal.  Neck: Neck supple. No thyromegaly present.  Cardiovascular: Normal rate, regular rhythm and normal heart sounds.   No murmur heard. Pulmonary/Chest: Effort normal and breath sounds normal. She has no wheezes.  Abdominal: She exhibits no distension and no mass.  Musculoskeletal: She exhibits no edema.  Lymphadenopathy:    She has no cervical adenopathy.  Neurological: She is alert and oriented to person, place, and time.  Skin: Skin is warm and dry. No rash noted. She is not diaphoretic.  Psychiatric: Memory, affect and judgment normal.    Lab Results  Component Value Date   TSH 1.96 12/05/2011   Lab Results  Component Value Date   WBC 12.6* 02/11/2012   HGB 8.8* 02/11/2012   HCT 27.3* 02/11/2012   MCV 90.1 02/11/2012   PLT 266 02/11/2012   Lab Results  Component Value Date   CREATININE 1.09 02/11/2012   BUN 16 02/11/2012   NA 131* 02/11/2012   K 3.9 02/11/2012   CL 94* 02/11/2012   CO2 31 02/11/2012   Lab Results  Component Value Date   ALT 26 01/29/2012   AST 26 01/29/2012   ALKPHOS 94 01/29/2012   BILITOT 0.3 01/29/2012   Lab Results  Component Value Date   CHOL 185 12/05/2011   Lab Results  Component Value Date   HDL 44.50 12/05/2011   Lab Results   Component Value Date   LDLCALC 62 06/09/2009   Lab Results  Component Value Date   TRIG 235.0* 12/05/2011   Lab Results  Component Value Date   CHOLHDL 4 12/05/2011     Assessment & Plan  HYPERTENSION Well controlled, no changes. today  UTI (lower urinary tract infection) Started on antibiotics, probiotics and cranberry tabs. Increase hydration  Constipation Increase fiber supplement to bid, change probiotics, change to Pericolace 1-2 daily if no results after 2-3 days, try MOM with prune juice +/- dulcolax suppository

## 2012-03-04 DIAGNOSIS — M1610 Unilateral primary osteoarthritis, unspecified hip: Secondary | ICD-10-CM | POA: Diagnosis not present

## 2012-03-06 ENCOUNTER — Encounter: Payer: Self-pay | Admitting: *Deleted

## 2012-03-06 ENCOUNTER — Other Ambulatory Visit: Payer: Self-pay

## 2012-03-06 DIAGNOSIS — M1610 Unilateral primary osteoarthritis, unspecified hip: Secondary | ICD-10-CM | POA: Diagnosis not present

## 2012-03-06 MED ORDER — ROSUVASTATIN CALCIUM 20 MG PO TABS: 20.0000 mg | ORAL_TABLET | Freq: Every day | ORAL | Status: DC

## 2012-03-08 DIAGNOSIS — M1610 Unilateral primary osteoarthritis, unspecified hip: Secondary | ICD-10-CM | POA: Diagnosis not present

## 2012-03-11 DIAGNOSIS — M1610 Unilateral primary osteoarthritis, unspecified hip: Secondary | ICD-10-CM | POA: Diagnosis not present

## 2012-03-12 DIAGNOSIS — M1610 Unilateral primary osteoarthritis, unspecified hip: Secondary | ICD-10-CM | POA: Diagnosis not present

## 2012-03-13 ENCOUNTER — Ambulatory Visit: Payer: Medicare Other | Admitting: Cardiology

## 2012-03-15 ENCOUNTER — Other Ambulatory Visit: Payer: Self-pay | Admitting: Family Medicine

## 2012-03-19 ENCOUNTER — Telehealth: Payer: Self-pay | Admitting: Family Medicine

## 2012-03-19 DIAGNOSIS — M1610 Unilateral primary osteoarthritis, unspecified hip: Secondary | ICD-10-CM | POA: Diagnosis not present

## 2012-03-19 NOTE — Telephone Encounter (Signed)
Ok to bring in sample for urinalysis c & s

## 2012-03-19 NOTE — Telephone Encounter (Signed)
Please advise 

## 2012-03-19 NOTE — Telephone Encounter (Signed)
Patient informed and states she will call Diane to schedule appt to drop off urine sample

## 2012-03-20 ENCOUNTER — Other Ambulatory Visit (INDEPENDENT_AMBULATORY_CARE_PROVIDER_SITE_OTHER): Payer: Medicare Other

## 2012-03-20 DIAGNOSIS — R350 Frequency of micturition: Secondary | ICD-10-CM | POA: Diagnosis not present

## 2012-03-20 DIAGNOSIS — Z96659 Presence of unspecified artificial knee joint: Secondary | ICD-10-CM | POA: Diagnosis not present

## 2012-03-20 DIAGNOSIS — R319 Hematuria, unspecified: Secondary | ICD-10-CM | POA: Diagnosis not present

## 2012-03-20 LAB — POCT URINALYSIS DIPSTICK
Bilirubin, UA: NEGATIVE
Ketones, UA: NEGATIVE
Protein, UA: NEGATIVE
Spec Grav, UA: 1.015

## 2012-03-22 DIAGNOSIS — M1610 Unilateral primary osteoarthritis, unspecified hip: Secondary | ICD-10-CM | POA: Diagnosis not present

## 2012-03-22 LAB — URINE CULTURE
Colony Count: NO GROWTH
Organism ID, Bacteria: NO GROWTH

## 2012-03-25 ENCOUNTER — Other Ambulatory Visit: Payer: Self-pay | Admitting: Family Medicine

## 2012-03-26 DIAGNOSIS — M1610 Unilateral primary osteoarthritis, unspecified hip: Secondary | ICD-10-CM | POA: Diagnosis not present

## 2012-03-29 DIAGNOSIS — M1610 Unilateral primary osteoarthritis, unspecified hip: Secondary | ICD-10-CM | POA: Diagnosis not present

## 2012-04-01 DIAGNOSIS — M1610 Unilateral primary osteoarthritis, unspecified hip: Secondary | ICD-10-CM | POA: Diagnosis not present

## 2012-04-03 DIAGNOSIS — M1610 Unilateral primary osteoarthritis, unspecified hip: Secondary | ICD-10-CM | POA: Diagnosis not present

## 2012-04-05 ENCOUNTER — Other Ambulatory Visit: Payer: Self-pay | Admitting: *Deleted

## 2012-04-05 DIAGNOSIS — C439 Malignant melanoma of skin, unspecified: Secondary | ICD-10-CM

## 2012-04-08 ENCOUNTER — Other Ambulatory Visit (HOSPITAL_BASED_OUTPATIENT_CLINIC_OR_DEPARTMENT_OTHER): Payer: Medicare Other | Admitting: Lab

## 2012-04-08 DIAGNOSIS — C50319 Malignant neoplasm of lower-inner quadrant of unspecified female breast: Secondary | ICD-10-CM | POA: Diagnosis not present

## 2012-04-08 DIAGNOSIS — C439 Malignant melanoma of skin, unspecified: Secondary | ICD-10-CM

## 2012-04-08 LAB — CBC WITH DIFFERENTIAL/PLATELET
Basophils Absolute: 0 10*3/uL (ref 0.0–0.1)
Eosinophils Absolute: 0.3 10*3/uL (ref 0.0–0.5)
HGB: 11.3 g/dL — ABNORMAL LOW (ref 11.6–15.9)
MONO#: 0.4 10*3/uL (ref 0.1–0.9)
MONO%: 9.3 % (ref 0.0–14.0)
NEUT#: 2.6 10*3/uL (ref 1.5–6.5)
RBC: 4.23 10*6/uL (ref 3.70–5.45)
RDW: 15.8 % — ABNORMAL HIGH (ref 11.2–14.5)
WBC: 4.4 10*3/uL (ref 3.9–10.3)
lymph#: 1.1 10*3/uL (ref 0.9–3.3)

## 2012-04-08 LAB — COMPREHENSIVE METABOLIC PANEL (CC13)
Albumin: 3.6 g/dL (ref 3.5–5.0)
Alkaline Phosphatase: 73 U/L (ref 40–150)
BUN: 10.5 mg/dL (ref 7.0–26.0)
Calcium: 9.2 mg/dL (ref 8.4–10.4)
Chloride: 107 mEq/L (ref 98–107)
Glucose: 100 mg/dl — ABNORMAL HIGH (ref 70–99)
Potassium: 4.1 mEq/L (ref 3.5–5.1)
Sodium: 142 mEq/L (ref 136–145)
Total Protein: 6.4 g/dL (ref 6.4–8.3)

## 2012-04-09 DIAGNOSIS — M1610 Unilateral primary osteoarthritis, unspecified hip: Secondary | ICD-10-CM | POA: Diagnosis not present

## 2012-04-11 DIAGNOSIS — M1610 Unilateral primary osteoarthritis, unspecified hip: Secondary | ICD-10-CM | POA: Diagnosis not present

## 2012-04-14 ENCOUNTER — Other Ambulatory Visit: Payer: Self-pay | Admitting: Family Medicine

## 2012-04-15 ENCOUNTER — Telehealth: Payer: Self-pay | Admitting: *Deleted

## 2012-04-15 ENCOUNTER — Encounter: Payer: Self-pay | Admitting: Physician Assistant

## 2012-04-15 ENCOUNTER — Ambulatory Visit (HOSPITAL_BASED_OUTPATIENT_CLINIC_OR_DEPARTMENT_OTHER): Payer: Medicare Other | Admitting: Physician Assistant

## 2012-04-15 VITALS — BP 139/82 | HR 83 | Temp 97.5°F | Resp 20 | Ht 63.0 in | Wt 194.0 lb

## 2012-04-15 DIAGNOSIS — C50319 Malignant neoplasm of lower-inner quadrant of unspecified female breast: Secondary | ICD-10-CM | POA: Diagnosis not present

## 2012-04-15 DIAGNOSIS — D649 Anemia, unspecified: Secondary | ICD-10-CM

## 2012-04-15 DIAGNOSIS — Z853 Personal history of malignant neoplasm of breast: Secondary | ICD-10-CM

## 2012-04-15 DIAGNOSIS — B354 Tinea corporis: Secondary | ICD-10-CM

## 2012-04-15 DIAGNOSIS — Z171 Estrogen receptor negative status [ER-]: Secondary | ICD-10-CM | POA: Diagnosis not present

## 2012-04-15 MED ORDER — NYSTATIN 100000 UNIT/GM EX POWD: Freq: Four times a day (QID) | CUTANEOUS | Status: DC

## 2012-04-15 NOTE — Progress Notes (Signed)
[name]   DOB: 1945-10-23  MR#: [id number]  BJY#:[id number]  HISTORY OF PRESENT ILLNESS: Shannon [name] had a negative screening mammogram in July of 2010.  About a month ago though she felt a change in the lower aspect of the right breast and brought this to Dr. Tenna Child attention.  He set her up for a diagnostic mammography on April 7 and Dr. Renato Gails found an interval mass with ill defined margins, deep in the lower, inner right breast.  This was palpable at the 3:30 position, approximately 9 cm from the nipple.  There were no palpable right axillary lymph nodes and by ultrasound there were no abnormal lymph nodes in the right axilla.  The ultrasound did show a 1.7 cm, irregular hypoechoic mass, just beneath the skin.  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  The tumor cells were estrogen receptor and progesterone receptor negative, both at 0%.  The MIB-1 was elevated at 57% and the HER2/neu ratio by CISH was positive at 2.74. Her subsequent history is as detailed below  INTERVAL HISTORY: [name] returns today for follow up of her right breast cancer. Interval history is remarkable for the fact that [name] had a right knee replacement under the care of Dr. Merita Norton in January. She is recovering well, but still has some discomfort in the leg. Her mobility, however, is significantly improved.  Her son who has Down's syndrome, has had increased dementia. They are being followed at [facility] by a specialist, and Shannon [name] was a little tearful discussing this with me today. She tells me she "has to stay healthy" to take care of her son, and "worries about this cancer coming back".    REVIEW OF SYSTEMS: Shannon [name]'s had no recent illnesses and denies any fevers or chills. She's noticed some areas of redness beneath the breasts she would like for me to look at today. She's had no  additional skin changes. She denies any signs of abnormal bleeding. She's eating and drinking well no nausea or change in bowel habits, other than some constipation shortly following surgery in January. She continued to be followed by Dr. Logan Bores for interstitial cystitis. She's had no cough or phlegm production. She has some shortness of breath with exertion which hasn't changed. She denies any chest pain or palpitations. She's had no increased headaches and denies any dizziness, change in vision, or gait disturbance.  Otherwise a detailed review of systems today was noncontributory.  PAST MEDICAL HISTORY: Past Medical History  Diagnosis Date  . NICM (nonischemic cardiomyopathy) 03/08/2010    likely 2/2 chemotx - a. Echo 2012: EF 45-50%;  b. Lex MV 2/12:  low risk, apical defect (small area of ischemia vs shifting breast atten);  c.  Echo 7/12: Normal wall thickness, EF 60-65%, normal wall motion, grade 1 diastolic dysfunction, mild LAE, PASP 32;   d. Lex MV 11/13:  EF 76%, no ischemia  . GERD 08/11/2008  . HYPERLIPIDEMIA 08/11/2008  . HYPERTENSION 08/11/2008  . LIPOMA 02/10/2009  . PREDIABETES 06/15/2009  . 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 Carpal tunnel syndrome of left wrist   . Neuropathy   . Interstitial cystitis   . Brain tumor   . DEPRESSION 08/11/2008  . 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 Osteoporosis   .  Hyponatremia 08/28/2011  . Anemia 08/28/2011  . History of chicken pox   . H/O measles   . H/O mumps   . Dermatitis   . OA (osteoarthritis) 09/07/2011  . Melanoma 2008    DR. Yetta Barre  . Shortness of breath   . Asthma   . History of blood transfusion   . UTI (lower urinary tract infection) 03/03/2012  . Constipation 03/03/2012  Significant for history of melanoma removed from the patient's anterior chest about five years ago by Dr. Nita Sells.  We will try to get that report.  She also had a  craniopharyngioma removed by Dr. Shirlean Kelly about 11 years ago.  She required no radiation for this (she presented with headaches and amaurosis of the right eye).  She has just undergone carpal tunnel release on the left by Dr. Josephine Igo and is recovering well from that.  She had a lipoma removed from her right thigh by Dr. Jamey Ripa.  She has a history of cholecystectomy, a history of prior breast cyst removal, a history of interstitial cystitis, followed by Dr. Logan Bores, history of hypertension, history of hypercholesterolemia, history of mild GERD/HH, history of childhood asthma, resolved, history of osteoarthritis involving both knees followed by Dr. Ollen Gross and history of osteopenias noted on bone density obtained 2009.  PAST SURGICAL HISTORY: Past Surgical History  Procedure Laterality Date  . Cholecystectomy    . Carpal tunnel release      L wrist, ulnar nerve moved  . Elbow surgery      left  . Tubal ligation  1997  . Lipoma excision  03/28/2009    right leg  . Portacath placement  may 2011  . Craniotomy for tumor  2000  . Tonsillectomy  1958  . Melanoma excision    . Porta cath    . Port-a-cath removal  11/30/2010    Streck  . Breast lumpectomy  04/2009    RIGHT FOR BREAST CANCER-CHEMO/RADIATION X 1 YEAR  . Total knee raplacement  01-2012    Right Knee  . Total knee arthroplasty  02/05/2012    Procedure: TOTAL KNEE ARTHROPLASTY;  Surgeon: Loanne Drilling, MD;  Location: [facility];  Service: Orthopedics;  Laterality: Right;    FAMILY HISTORY Family History  Problem Relation Age of Onset  . Breast cancer Mother   . Lung cancer Mother   . Breast cancer Mother     sarcoma  . Hypertension Mother   . Colon cancer Neg Hx   . Prostate cancer Father   . Congestive Heart Failure Father   . Prostate cancer Father   . Prostate cancer Brother   . Prostate cancer Brother   . Stomach cancer Maternal Aunt   . Uterine cancer Maternal Aunt   . Down syndrome Son   . Heart  disease Maternal Grandfather     MI  . Heart attack Father   The patient's father is alive at age 37.  He lives in a residential home, but the patient consider herself as his primary caregiver.  The patient's mother had breast cancer diagnosed at the age of 39.  She developed lower extremity sarcoma 10 years later, which eventually took her life.  The patient has one brother and one sister both in good health.  GYNECOLOGIC HISTORY: She is P3, first pregnancy to term age 66.  Change of life around age 47.  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  Her husband Rayna Sexton is an English as a second language teacher.  Daughter Christieis a Engineer, civil (consulting) at [employer] in [place].  Son Nida Boatman  works as an Radio broadcast assistant, actually not with his dad.  The third child is [name], lives at home, has Down's syndrome.  The patient attends [employer].   ADVANCED DIRECTIVES:  HEALTH MAINTENANCE: History  Substance Use Topics  . Smoking status: Never Smoker   . Smokeless tobacco: Never Used  . Alcohol Use: No     Colonoscopy:  PAP:  Bone density:  Lipid panel:  No Known Allergies  Current Outpatient Prescriptions  Medication Sig Dispense Refill  . aspirin 325 MG tablet Take 1 tablet (325 mg total) by mouth 2 (two) times daily. Take for three weeks.  Once the patient has completed the full dose aspirin twice a day for three weeks, then discontinue the full dose aspirin and resume the 81 mg Aspirin daily.  42 tablet  0  . ATELVIA 35 MG TBEC       . carvedilol (COREG) 6.25 MG tablet Take 6.25 mg by mouth 2 (two) times daily.      . ferrous gluconate (FERGON) 324 MG tablet Take 324 mg by mouth daily with breakfast.      . folic acid (FOLVITE) 1 MG tablet TAKE 1 TABLET BY MOUTH EVERY DAY  30 tablet  3  . gabapentin (NEURONTIN) 300 MG capsule Take  300 mg by mouth at bedtime.      . hydrOXYzine (ATARAX/VISTARIL) 25 MG tablet TAKE 1 TABLET BY MOUTH EVERY DAY  30 tablet  0  . LORazepam (ATIVAN) 0.5 MG tablet Take 1 tablet (0.5 mg total) by mouth at bedtime.  90 tablet  2  . omeprazole (PRILOSEC) 40 MG capsule TAKE 2 CAPSULES BY MOUTH EVERY DAY FOR 2 WEEKS THEN RESUME ONCE DAILY UNTIL YOU SEE GI  60 capsule  0  . pentosan polysulfate (ELMIRON) 100 MG capsule Take 2 capsules (200 mg total) by mouth 2 (two) times daily.  60 capsule  0  . phenazopyridine (PYRIDIUM) 100 MG tablet Take 1 tablet (100 mg total) by mouth 3 (three) times daily as needed for pain.  10 tablet  0  . psyllium (METAMUCIL SMOOTH TEXTURE) 28 % packet Take 1 packet by mouth daily.      . rosuvastatin (CRESTOR) 20 MG tablet Take 1 tablet (20 mg total) by mouth at bedtime.  30 tablet  6  . sucralfate (CARAFATE) 1 G tablet Take 1 g by mouth daily as needed.      Marland Kitchen telmisartan (MICARDIS) 40 MG tablet Take 40 mg by mouth daily.      Marland Kitchen venlafaxine XR (EFFEXOR-XR) 75 MG 24 hr capsule Take 75 mg by mouth at bedtime.      . ciprofloxacin (CIPRO) 250 MG tablet Take 1 tablet (250 mg total) by mouth 2 (two) times daily.  14 tablet  0  . guaiFENesin (MUCINEX) 600 MG 12 hr tablet Take 600 mg by mouth every 12 (twelve) hours as needed. For congestion      . HYDROcodone-acetaminophen (NORCO) 7.5-325 MG per tablet Take 1 tablet by mouth every 6 (six) hours as needed. pain      . methocarbamol (ROBAXIN) 500 MG tablet Take 1 tablet (500 mg total) by mouth every 6 (six) hours as needed.  80 tablet  0   No current facility-administered medications for  this visit.    OBJECTIVE: Middle-aged white woman who appears tired but in no acute distress Filed Vitals:   04/15/12 1117  BP: 139/82  Pulse: 83  Temp: 97.5 F (36.4 C)  Resp: 20     Body mass index is 34.37 kg/(m^2).    ECOG FS: 1 Filed Weights   04/15/12 1117  Weight: 194 lb (87.998 kg)   Sclerae unicteric Oropharynx clear No  cervical or supraclavicular adenopathy Lungs clear to auscultation, with no rales or rhonchi Heart regular rate and rhythm Abdomen soft, nontender to palpation, positive bowel sounds MSK no focal spinal tenderness, no peripheral edema Neuro: nonfocal, well oriented, but anxious affect Breasts: The right breast is status post lumpectomy, with no evidence of local recurrence. There is an erythematous fungal-appearing rash in the inframammary folds bilaterally, greater on the right than the left. Left breast is otherwise unremarkable. Axillae are benign bilaterally with no palpable adenopathy.  LAB RESULTS: Lab Results  Component Value Date   WBC 4.4 04/08/2012   NEUTROABS 2.6 04/08/2012   HGB 11.3* 04/08/2012   HCT 35.0 04/08/2012   MCV 82.8 04/08/2012   PLT 194 04/08/2012      Chemistry      Component Value Date/Time   NA 142 04/08/2012 1018   NA 131* 02/11/2012 1515   NA 144 10/20/2010 0854   K 4.1 04/08/2012 1018   K 3.9 02/11/2012 1515   K 4.2 10/20/2010 0854   CL 107 04/08/2012 1018   CL 94* 02/11/2012 1515   CL 108 10/20/2010 0854   CO2 28 04/08/2012 1018   CO2 31 02/11/2012 1515   CO2 26 10/20/2010 0854   BUN 10.5 04/08/2012 1018   BUN 16 02/11/2012 1515   BUN 13 10/20/2010 0854   CREATININE 0.8 04/08/2012 1018   CREATININE 1.09 02/11/2012 1515   CREATININE 0.9 10/20/2010 0854      Component Value Date/Time   CALCIUM 9.2 04/08/2012 1018   CALCIUM 8.6 02/11/2012 1515   CALCIUM 9.3 03/08/2011 1447   CALCIUM 8.8 10/20/2010 0854   ALKPHOS 73 04/08/2012 1018   ALKPHOS 94 01/29/2012 1225   ALKPHOS 106* 10/20/2010 0854   AST 26 04/08/2012 1018   AST 26 01/29/2012 1225   AST 32 10/20/2010 0854   ALT 27 04/08/2012 1018   ALT 26 01/29/2012 1225   BILITOT 0.36 04/08/2012 1018   BILITOT 0.3 01/29/2012 1225   BILITOT 0.50 10/20/2010 0854       Lab Results  Component Value Date   LABCA2 19 10/03/2011     STUDIES: Mm Digital Diagnostic Bilat  08/08/2011  *RADIOLOGY REPORT*  Clinical Data:  The  patient underwent right lumpectomy, chemotherapy and radiation therapy for breast cancer in 2011.  DIGITAL DIAGNOSTIC BILATERAL MAMMOGRAM WITH CAD  Comparison:  08/04/2010, 05/06/2009  Findings:  The breast tissue is almost entirely fatty.  Lumpectomy changes are noted in the right lower inner quadrant posteriorly. There is a coarse benign dystrophic calcification in the lumpectomy site.  There is no suspicious dominant mass, nonsurgical architectural distortion or calcification to suggest malignancy. Mammographic images were processed with CAD.  IMPRESSION: No mammographic evidence of malignancy.  RECOMMENDATION: Yearly diagnostic mammography is suggested.  BI-RADS CATEGORY 2:  Benign finding(s).  Original Report Authenticated By: Daryl Eastern, M.D.    02/11/2012 *RADIOLOGY REPORT*  Clinical Data: head pain  CT HEAD WITHOUT CONTRAST  Technique: Contiguous axial images were obtained from the base of  the skull through the vertex without contrast.  Comparison: 08/09/2011  Findings: The brain has a normal appearance without evidence for  hemorrhage, infarction, hydrocephalus, or mass lesion. There is no  extra axial fluid collection. The skull appears normal. There is  a right frontal craniotomy defect.  IMPRESSION:  1. No acute intracranial abnormalities.  2. Previous right frontal craniotomy.  Original Report Authenticated By: Signa Kell, M.D.    02/11/2012 *RADIOLOGY REPORT*  Clinical Data: Loss of consciousness.  PORTABLE CHEST - 1 VIEW  Comparison: 09/16/2011 and a chest CT dated 10/03/2011  Findings: Heart size and vascularity are normal and the lungs are  clear. Chronic elevation of the right hemidiaphragm, unchanged  since the prior chest CT.  IMPRESSION:  No acute disease.  Original Report Authenticated By: Francene Boyers, M.D.     ASSESSMENT: 67 y.o. [facility] woman status post right breast biopsy April 2011 for a clinical T2 N0 grade 3 invasive ductal carcinoma  which was estrogen and progesterone receptor negative, HER-2/neu positive with an MIB-1 of 58%  (1) treated neoadjuvantly with multiple agents (due to tolerance issues):  Namely 2 cycles of standard dose paclitaxel/ carboplatin/ trastuzumab followed by 3 cycles of paclitaxel/ trastuzumab given weekly, the last of this given with weekly carboplatin, followed by 2 cycles of dose dense doxorubicin and cyclophosphamide with the doxorubicin given by continuous infusion all of this completed in August 2011  (2) status post definitive right lumpectomy and sentinel lymph node sampling September 2011 showing a complete pathologic response both in the breast and sampled lymph nodes.    (3) completed radiation therapy November 2011.    (4) continued to receive trastuzumab until January 2012 at which time it was discontinued secondary to a drop in her ejection fraction which has now completely resolved.   PLAN:  Mikaiya appears to be doing well with regards to her breast cancer, with no clinical evidence of disease progression. She continues to be very anxious about the possibility of recurrence. Specifically, she would really like to have a CA 27. 29 drawn. We discussed the fact that this could be falsely elevated simply due to her recent knee replacement, so we will wait approximately 6 more weeks to drop his lab. At that point, we will also recheck her CBC to follow her postsurgical anemia which continues to improve.  Of course she recently had a CT of the head in January following her knee replacement, and it showed no evidence of metastatic disease which was very reassuring to her.  I am prescribing nystatin powder to be applied in the inframammary folds bilaterally 4 times daily for the next 2-3 weeks, then as needed for what appears to be a yeast or fungal rash. Dalonda does know to call us, however, if this does not improve, and certainly should it worsen.  Otherwise we are no changes in her of followup. She  will see [place] again in 6 months, with labs drawn just prior to that visit.  She knows to call for any problems that may develop before the next visit.  Emmilyn Crooke    04/15/2012

## 2012-04-15 NOTE — Telephone Encounter (Signed)
appts made and printed 

## 2012-04-16 DIAGNOSIS — M1610 Unilateral primary osteoarthritis, unspecified hip: Secondary | ICD-10-CM | POA: Diagnosis not present

## 2012-04-17 ENCOUNTER — Other Ambulatory Visit: Payer: Self-pay | Admitting: Family Medicine

## 2012-04-19 DIAGNOSIS — M1610 Unilateral primary osteoarthritis, unspecified hip: Secondary | ICD-10-CM | POA: Diagnosis not present

## 2012-04-23 DIAGNOSIS — M1610 Unilateral primary osteoarthritis, unspecified hip: Secondary | ICD-10-CM | POA: Diagnosis not present

## 2012-04-24 DIAGNOSIS — R350 Frequency of micturition: Secondary | ICD-10-CM | POA: Diagnosis not present

## 2012-04-24 DIAGNOSIS — R3915 Urgency of urination: Secondary | ICD-10-CM | POA: Diagnosis not present

## 2012-04-24 DIAGNOSIS — N949 Unspecified condition associated with female genital organs and menstrual cycle: Secondary | ICD-10-CM | POA: Diagnosis not present

## 2012-04-24 DIAGNOSIS — N3010 Interstitial cystitis (chronic) without hematuria: Secondary | ICD-10-CM | POA: Diagnosis not present

## 2012-04-24 DIAGNOSIS — R109 Unspecified abdominal pain: Secondary | ICD-10-CM | POA: Diagnosis not present

## 2012-04-25 DIAGNOSIS — M1610 Unilateral primary osteoarthritis, unspecified hip: Secondary | ICD-10-CM | POA: Diagnosis not present

## 2012-04-29 DIAGNOSIS — N3010 Interstitial cystitis (chronic) without hematuria: Secondary | ICD-10-CM | POA: Diagnosis not present

## 2012-04-30 DIAGNOSIS — M1610 Unilateral primary osteoarthritis, unspecified hip: Secondary | ICD-10-CM | POA: Diagnosis not present

## 2012-05-01 DIAGNOSIS — N3010 Interstitial cystitis (chronic) without hematuria: Secondary | ICD-10-CM | POA: Diagnosis not present

## 2012-05-07 DIAGNOSIS — H2510 Age-related nuclear cataract, unspecified eye: Secondary | ICD-10-CM | POA: Diagnosis not present

## 2012-05-08 DIAGNOSIS — N3010 Interstitial cystitis (chronic) without hematuria: Secondary | ICD-10-CM | POA: Diagnosis not present

## 2012-05-14 DIAGNOSIS — Q828 Other specified congenital malformations of skin: Secondary | ICD-10-CM | POA: Diagnosis not present

## 2012-05-14 DIAGNOSIS — L919 Hypertrophic disorder of the skin, unspecified: Secondary | ICD-10-CM | POA: Diagnosis not present

## 2012-05-14 DIAGNOSIS — L821 Other seborrheic keratosis: Secondary | ICD-10-CM | POA: Diagnosis not present

## 2012-05-14 DIAGNOSIS — D485 Neoplasm of uncertain behavior of skin: Secondary | ICD-10-CM | POA: Diagnosis not present

## 2012-05-14 DIAGNOSIS — L909 Atrophic disorder of skin, unspecified: Secondary | ICD-10-CM | POA: Diagnosis not present

## 2012-05-14 DIAGNOSIS — L570 Actinic keratosis: Secondary | ICD-10-CM | POA: Diagnosis not present

## 2012-05-15 DIAGNOSIS — N3010 Interstitial cystitis (chronic) without hematuria: Secondary | ICD-10-CM | POA: Diagnosis not present

## 2012-05-20 ENCOUNTER — Other Ambulatory Visit: Payer: Self-pay | Admitting: Family Medicine

## 2012-05-21 ENCOUNTER — Ambulatory Visit (INDEPENDENT_AMBULATORY_CARE_PROVIDER_SITE_OTHER): Payer: Medicare Other | Admitting: Cardiology

## 2012-05-21 ENCOUNTER — Encounter: Payer: Self-pay | Admitting: Cardiology

## 2012-05-21 VITALS — BP 122/66 | HR 83 | Ht 63.0 in | Wt 192.0 lb

## 2012-05-21 DIAGNOSIS — I429 Cardiomyopathy, unspecified: Secondary | ICD-10-CM | POA: Diagnosis not present

## 2012-05-21 DIAGNOSIS — E785 Hyperlipidemia, unspecified: Secondary | ICD-10-CM | POA: Diagnosis not present

## 2012-05-21 NOTE — Progress Notes (Signed)
HPI Mrs. Shannon [name] comes in for a history of nonischemic cardiomyopathy from receiving chemotherapy for breast cancer. Her last ejection fraction was 60-65% last year. She also a stress Myoview that was normal for atypical chest pain. She does have some cardiac risk factors.  She denies any angina or ischemic symptoms. She denies orthopnea, PND or edema. She's lost 20 pounds. She denies any presyncope, syncope or palpitations.  Past Medical History  Diagnosis Date  . NICM (nonischemic cardiomyopathy) 03/08/2010    likely 2/2 chemotx - a. Echo 2012: EF 45-50%;  b. Lex MV 2/12:  low risk, apical defect (small area of ischemia vs shifting breast atten);  c.  Echo 7/12: Normal [name] thickness, EF 60-65%, normal [name] motion, grade 1 diastolic dysfunction, mild LAE, PASP 32;   d. Lex MV 11/13:  EF 76%, no ischemia  . GERD 08/11/2008  . HYPERLIPIDEMIA 08/11/2008  . HYPERTENSION 08/11/2008  . LIPOMA 02/10/2009  . PREDIABETES 06/15/2009  . 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 Carpal tunnel syndrome of left wrist   . Neuropathy   . Interstitial cystitis   . Brain tumor   . DEPRESSION 08/11/2008  . 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 Osteoporosis   . Hyponatremia 08/28/2011  . Anemia 08/28/2011  . History of chicken pox   . H/O measles   . H/O mumps   . Dermatitis   . OA (osteoarthritis) 09/07/2011  . Melanoma 2008    DR. Yetta Barre  . Shortness of breath   . Asthma   . History of blood transfusion   . UTI (lower urinary tract infection) 03/03/2012  . Constipation 03/03/2012    Current Outpatient Prescriptions  Medication Sig Dispense Refill  . aspirin 325 MG tablet Take 1 tablet (325 mg total) by mouth 2 (two) times daily. Take for three weeks.  Once the patient has completed the full dose aspirin twice a day for three weeks, then discontinue the full dose aspirin and resume the 81 mg Aspirin daily.  42 tablet  0  . ATELVIA 35 MG  TBEC       . carvedilol (COREG) 6.25 MG tablet Take 6.25 mg by mouth 2 (two) times daily.      . ciprofloxacin (CIPRO) 250 MG tablet Take 1 tablet (250 mg total) by mouth 2 (two) times daily.  14 tablet  0  . Ferrous Fumarate 324 MG TABS TAKE 1 TABLET BY MOUTH EVERY DAY  30 tablet  3  . folic acid (FOLVITE) 1 MG tablet TAKE 1 TABLET BY MOUTH EVERY DAY  30 tablet  3  . gabapentin (NEURONTIN) 300 MG capsule Take 300 mg by mouth at bedtime.      . hydrOXYzine (ATARAX/VISTARIL) 25 MG tablet TAKE 1 TABLET BY MOUTH EVERY DAY  30 tablet  0  . LORazepam (ATIVAN) 0.5 MG tablet Take 1 tablet (0.5 mg total) by mouth at bedtime.  90 tablet  2  . methocarbamol (ROBAXIN) 500 MG tablet Take 1 tablet (500 mg total) by mouth every 6 (six) hours as needed.  80 tablet  0  . nystatin (MYCOSTATIN) powder Apply topically 4 (four) times daily. X 2-3 wks, then PRN  60 g  1  . omeprazole (PRILOSEC) 40 MG capsule TAKE 2 CAPSULES BY MOUTH EVERY DAY FOR 2 WEEKS THEN RESUME ONCE DAILY UNTIL YOU SEE GI  60 capsule  0  .  pentosan polysulfate (ELMIRON) 100 MG capsule Take 2 capsules (200 mg total) by mouth 2 (two) times daily.  60 capsule  0  . phenazopyridine (PYRIDIUM) 100 MG tablet Take 1 tablet (100 mg total) by mouth 3 (three) times daily as needed for pain.  10 tablet  0  . psyllium (METAMUCIL SMOOTH TEXTURE) 28 % packet Take 1 packet by mouth daily.      . rosuvastatin (CRESTOR) 20 MG tablet Take 1 tablet (20 mg total) by mouth at bedtime.  30 tablet  6  . sucralfate (CARAFATE) 1 G tablet Take 1 g by mouth daily as needed.      Marland Kitchen telmisartan (MICARDIS) 80 MG tablet       . venlafaxine XR (EFFEXOR-XR) 75 MG 24 hr capsule Take 75 mg by mouth at bedtime.       No current facility-administered medications for this visit.    No Known Allergies  Family History  Problem Relation Age of Onset  . Breast cancer Mother   . Lung cancer Mother   . Breast cancer Mother     sarcoma  . Hypertension Mother   . Colon cancer Neg  Hx   . Prostate cancer Father   . Congestive Heart Failure Father   . Prostate cancer Father   . Prostate cancer Brother   . Prostate cancer Brother   . Stomach cancer Maternal Aunt   . Uterine cancer Maternal Aunt   . Down syndrome Son   . Heart disease Maternal Grandfather     MI  . Heart attack Father     History   Social History  . Marital Status: Married    Spouse Name: N/A    Number of Children: 3  . Years of Education: N/A   Occupational History  . boutique owner    Social History Main Topics  . Smoking status: Never Smoker   . Smokeless tobacco: Never Used  . Alcohol Use: No  . Drug Use: No  . Sexually Active: No   Other Topics Concern  . Not on file   Social History Narrative  . No narrative on file    ROS ALL NEGATIVE EXCEPT THOSE NOTED IN HPI  PE  General Appearance: well developed, well nourished in no acute distress, overweight HEENT: symmetrical face, PERRLA, good dentition  Neck: no JVD, thyromegaly, or adenopathy, trachea midline Chest: symmetric without deformity Cardiac: PMI non-displaced, RRR, normal S1, S2, no gallop or murmur Lung: clear to ausculation and percussion Vascular: all pulses full without bruits  Abdominal: nondistended, nontender, good bowel sounds, no HSM, no bruits Extremities: no cyanosis, clubbing or edema, no sign of DVT, no varicosities  Skin: normal color, no rashes Neuro: alert and oriented x 3, non-focal Pysch: normal affect  EKG Not repeated  BMET    Component Value Date/Time   NA 142 04/08/2012 1018   NA 131* 02/11/2012 1515   NA 144 10/20/2010 0854   K 4.1 04/08/2012 1018   K 3.9 02/11/2012 1515   K 4.2 10/20/2010 0854   CL 107 04/08/2012 1018   CL 94* 02/11/2012 1515   CL 108 10/20/2010 0854   CO2 28 04/08/2012 1018   CO2 31 02/11/2012 1515   CO2 26 10/20/2010 0854   GLUCOSE 100* 04/08/2012 1018   GLUCOSE 122* 02/11/2012 1515   GLUCOSE 126* 10/20/2010 0854   BUN 10.5 04/08/2012 1018   BUN 16 02/11/2012 1515    BUN 13 10/20/2010 0854   CREATININE 0.8 04/08/2012 1018  CREATININE 1.09 02/11/2012 1515   CREATININE 0.9 10/20/2010 0854   CALCIUM 9.2 04/08/2012 1018   CALCIUM 8.6 02/11/2012 1515   CALCIUM 9.3 03/08/2011 1447   CALCIUM 8.8 10/20/2010 0854   GFRNONAA 51* 02/11/2012 1515   GFRAA 59* 02/11/2012 1515    Lipid Panel     Component Value Date/Time   CHOL 185 12/05/2011 0841   TRIG 235.0* 12/05/2011 0841   HDL 44.50 12/05/2011 0841   CHOLHDL 4 12/05/2011 0841   VLDL 47.0* 12/05/2011 0841   LDLCALC 62 06/09/2009 0908    CBC    Component Value Date/Time   WBC 4.4 04/08/2012 1018   WBC 12.6* 02/11/2012 1515   RBC 4.23 04/08/2012 1018   RBC 3.03* 02/11/2012 1515   HGB 11.3* 04/08/2012 1018   HGB 8.8* 02/11/2012 1515   HCT 35.0 04/08/2012 1018   HCT 27.3* 02/11/2012 1515   PLT 194 04/08/2012 1018   PLT 266 02/11/2012 1515   MCV 82.8 04/08/2012 1018   MCV 90.1 02/11/2012 1515   MCH 26.7 04/08/2012 1018   MCH 29.0 02/11/2012 1515   MCHC 32.3 04/08/2012 1018   MCHC 32.2 02/11/2012 1515   RDW 15.8* 04/08/2012 1018   RDW 14.1 02/11/2012 1515   LYMPHSABS 1.1 04/08/2012 1018   LYMPHSABS 1.3 10/05/2009 0913   MONOABS 0.4 04/08/2012 1018   MONOABS 1.0 10/05/2009 0913   EOSABS 0.3 04/08/2012 1018   EOSABS 1.6* 10/05/2009 0913   BASOSABS 0.0 04/08/2012 1018   BASOSABS 0.2* 10/05/2009 0913

## 2012-05-21 NOTE — Assessment & Plan Note (Signed)
This is totally resolved with an EF of 60%. Patient reassured. Return the office in one year or when necessary.

## 2012-05-21 NOTE — Patient Instructions (Addendum)
Your physician wants you to follow-up in: 1 year. You will receive a reminder letter in the mail two months in advance. If you don't receive a letter, please call our office to schedule the follow-up appointment.  

## 2012-05-22 DIAGNOSIS — N3010 Interstitial cystitis (chronic) without hematuria: Secondary | ICD-10-CM | POA: Diagnosis not present

## 2012-05-24 DIAGNOSIS — M1610 Unilateral primary osteoarthritis, unspecified hip: Secondary | ICD-10-CM | POA: Diagnosis not present

## 2012-05-27 ENCOUNTER — Other Ambulatory Visit (HOSPITAL_BASED_OUTPATIENT_CLINIC_OR_DEPARTMENT_OTHER): Payer: Medicare Other | Admitting: Lab

## 2012-05-27 DIAGNOSIS — C50319 Malignant neoplasm of lower-inner quadrant of unspecified female breast: Secondary | ICD-10-CM

## 2012-05-27 DIAGNOSIS — Z853 Personal history of malignant neoplasm of breast: Secondary | ICD-10-CM

## 2012-05-27 DIAGNOSIS — D649 Anemia, unspecified: Secondary | ICD-10-CM

## 2012-05-27 LAB — CBC WITH DIFFERENTIAL/PLATELET
Basophils Absolute: 0 10*3/uL (ref 0.0–0.1)
Eosinophils Absolute: 0.3 10*3/uL (ref 0.0–0.5)
HCT: 34.4 % — ABNORMAL LOW (ref 34.8–46.6)
LYMPH%: 25.5 % (ref 14.0–49.7)
MCV: 82.4 fL (ref 79.5–101.0)
MONO#: 0.6 10*3/uL (ref 0.1–0.9)
NEUT#: 3.5 10*3/uL (ref 1.5–6.5)
NEUT%: 59.4 % (ref 38.4–76.8)
Platelets: 225 10*3/uL (ref 145–400)
WBC: 5.9 10*3/uL (ref 3.9–10.3)

## 2012-06-05 DIAGNOSIS — M1610 Unilateral primary osteoarthritis, unspecified hip: Secondary | ICD-10-CM | POA: Diagnosis not present

## 2012-06-12 DIAGNOSIS — M1610 Unilateral primary osteoarthritis, unspecified hip: Secondary | ICD-10-CM | POA: Diagnosis not present

## 2012-07-22 ENCOUNTER — Other Ambulatory Visit: Payer: Self-pay | Admitting: Physician Assistant

## 2012-08-08 DIAGNOSIS — M1710 Unilateral primary osteoarthritis, unspecified knee: Secondary | ICD-10-CM | POA: Diagnosis not present

## 2012-08-08 DIAGNOSIS — IMO0002 Reserved for concepts with insufficient information to code with codable children: Secondary | ICD-10-CM | POA: Diagnosis not present

## 2012-08-13 ENCOUNTER — Ambulatory Visit
Admission: RE | Admit: 2012-08-13 | Discharge: 2012-08-13 | Disposition: A | Discharge status: Home / Self Care | Payer: Medicare Other | Source: Ambulatory Visit | Attending: Physician Assistant | Admitting: Physician Assistant

## 2012-08-13 DIAGNOSIS — Z853 Personal history of malignant neoplasm of breast: Secondary | ICD-10-CM

## 2012-08-19 ENCOUNTER — Other Ambulatory Visit: Payer: Self-pay | Admitting: Family Medicine

## 2012-08-19 ENCOUNTER — Other Ambulatory Visit: Payer: Self-pay | Admitting: Oncology

## 2012-09-20 DIAGNOSIS — IMO0002 Reserved for concepts with insufficient information to code with codable children: Secondary | ICD-10-CM | POA: Diagnosis not present

## 2012-09-20 DIAGNOSIS — M1710 Unilateral primary osteoarthritis, unspecified knee: Secondary | ICD-10-CM | POA: Diagnosis not present

## 2012-09-24 ENCOUNTER — Other Ambulatory Visit: Payer: Self-pay | Admitting: Family Medicine

## 2012-09-27 DIAGNOSIS — M1710 Unilateral primary osteoarthritis, unspecified knee: Secondary | ICD-10-CM | POA: Diagnosis not present

## 2012-09-27 DIAGNOSIS — IMO0002 Reserved for concepts with insufficient information to code with codable children: Secondary | ICD-10-CM | POA: Diagnosis not present

## 2012-10-01 ENCOUNTER — Telehealth: Payer: Self-pay | Admitting: *Deleted

## 2012-10-01 NOTE — Telephone Encounter (Signed)
Pt called to this RN to state concern due to ongoing headaches now without relief.  Per phone discussion- Marcena states she has been having intermittant headaches for the past several weeks, but then over the past week- headaches have been more consistant and not relieved with use of aleve, she also used "bonine" thinking headaches could be related to inner ear.  She states noted balance concern with dizziness.  She denies any nausea or visual changes.  Anistyn states she is scheduled to see MD later this month but is concerned due to above and was hoping MD could see her sooner or if appropriate obtain a scan.  Return call number given as [phone].

## 2012-10-03 ENCOUNTER — Other Ambulatory Visit: Payer: Self-pay | Admitting: *Deleted

## 2012-10-03 DIAGNOSIS — Z853 Personal history of malignant neoplasm of breast: Secondary | ICD-10-CM

## 2012-10-03 DIAGNOSIS — C439 Malignant melanoma of skin, unspecified: Secondary | ICD-10-CM

## 2012-10-03 DIAGNOSIS — D444 Neoplasm of uncertain behavior of craniopharyngeal duct: Secondary | ICD-10-CM

## 2012-10-04 DIAGNOSIS — IMO0002 Reserved for concepts with insufficient information to code with codable children: Secondary | ICD-10-CM | POA: Diagnosis not present

## 2012-10-04 DIAGNOSIS — M1710 Unilateral primary osteoarthritis, unspecified knee: Secondary | ICD-10-CM | POA: Diagnosis not present

## 2012-10-08 ENCOUNTER — Ambulatory Visit (HOSPITAL_COMMUNITY)
Admission: RE | Admit: 2012-10-08 | Discharge: 2012-10-08 | Disposition: A | Discharge status: Home / Self Care | Payer: Medicare Other | Source: Ambulatory Visit | Attending: Oncology | Admitting: Oncology

## 2012-10-08 ENCOUNTER — Other Ambulatory Visit (HOSPITAL_BASED_OUTPATIENT_CLINIC_OR_DEPARTMENT_OTHER): Payer: Medicare Other

## 2012-10-08 DIAGNOSIS — C439 Malignant melanoma of skin, unspecified: Secondary | ICD-10-CM

## 2012-10-08 DIAGNOSIS — R51 Headache: Secondary | ICD-10-CM | POA: Diagnosis not present

## 2012-10-08 DIAGNOSIS — Z8669 Personal history of other diseases of the nervous system and sense organs: Secondary | ICD-10-CM | POA: Insufficient documentation

## 2012-10-08 DIAGNOSIS — Z85820 Personal history of malignant melanoma of skin: Secondary | ICD-10-CM | POA: Insufficient documentation

## 2012-10-08 DIAGNOSIS — Z853 Personal history of malignant neoplasm of breast: Secondary | ICD-10-CM

## 2012-10-08 DIAGNOSIS — C50319 Malignant neoplasm of lower-inner quadrant of unspecified female breast: Secondary | ICD-10-CM

## 2012-10-08 DIAGNOSIS — D444 Neoplasm of uncertain behavior of craniopharyngeal duct: Secondary | ICD-10-CM

## 2012-10-08 DIAGNOSIS — D649 Anemia, unspecified: Secondary | ICD-10-CM

## 2012-10-08 LAB — COMPREHENSIVE METABOLIC PANEL (CC13)
ALT: 25 U/L (ref 0–55)
AST: 23 U/L (ref 5–34)
Albumin: 3.6 g/dL (ref 3.5–5.0)
Alkaline Phosphatase: 94 U/L (ref 40–150)
BUN: 11.3 mg/dL (ref 7.0–26.0)
CO2: 29 mEq/L (ref 22–29)
Calcium: 9.3 mg/dL (ref 8.4–10.4)
Chloride: 105 mEq/L (ref 98–109)
Creatinine: 0.8 mg/dL (ref 0.6–1.1)
Glucose: 101 mg/dl (ref 70–140)
Potassium: 4.1 mEq/L (ref 3.5–5.1)
Sodium: 141 mEq/L (ref 136–145)
Total Bilirubin: 0.46 mg/dL (ref 0.20–1.20)
Total Protein: 6.4 g/dL (ref 6.4–8.3)

## 2012-10-08 LAB — CBC WITH DIFFERENTIAL/PLATELET
EOS%: 4.4 % (ref 0.0–7.0)
LYMPH%: 16 % (ref 14.0–49.7)
MCH: 29.2 pg (ref 25.1–34.0)
MCHC: 33.1 g/dL (ref 31.5–36.0)
MCV: 88.2 fL (ref 79.5–101.0)
MONO%: 8.4 % (ref 0.0–14.0)
Platelets: 237 10*3/uL (ref 145–400)
RBC: 4 10*6/uL (ref 3.70–5.45)
RDW: 14.1 % (ref 11.2–14.5)

## 2012-10-08 MED ORDER — GADOBENATE DIMEGLUMINE 529 MG/ML IV SOLN
20.0000 mL | Freq: Once | INTRAVENOUS | Status: AC | PRN
  Administered 2012-10-08: 18 mL via INTRAVENOUS

## 2012-10-14 ENCOUNTER — Other Ambulatory Visit: Payer: Medicare Other | Admitting: Lab

## 2012-10-18 ENCOUNTER — Ambulatory Visit (INDEPENDENT_AMBULATORY_CARE_PROVIDER_SITE_OTHER): Payer: Medicare Other | Admitting: Family Medicine

## 2012-10-18 ENCOUNTER — Encounter: Payer: Self-pay | Admitting: Family Medicine

## 2012-10-18 VITALS — BP 138/96 | HR 78 | Temp 98.2°F | Ht 63.0 in | Wt 197.1 lb

## 2012-10-18 DIAGNOSIS — F32A Depression, unspecified: Secondary | ICD-10-CM

## 2012-10-18 DIAGNOSIS — E785 Hyperlipidemia, unspecified: Secondary | ICD-10-CM | POA: Diagnosis not present

## 2012-10-18 DIAGNOSIS — Z23 Encounter for immunization: Secondary | ICD-10-CM | POA: Diagnosis not present

## 2012-10-18 DIAGNOSIS — F341 Dysthymic disorder: Secondary | ICD-10-CM | POA: Diagnosis not present

## 2012-10-18 DIAGNOSIS — F329 Major depressive disorder, single episode, unspecified: Secondary | ICD-10-CM

## 2012-10-18 DIAGNOSIS — M1990 Unspecified osteoarthritis, unspecified site: Secondary | ICD-10-CM

## 2012-10-18 DIAGNOSIS — IMO0002 Reserved for concepts with insufficient information to code with codable children: Secondary | ICD-10-CM

## 2012-10-18 DIAGNOSIS — F3289 Other specified depressive episodes: Secondary | ICD-10-CM

## 2012-10-18 DIAGNOSIS — I10 Essential (primary) hypertension: Secondary | ICD-10-CM | POA: Diagnosis not present

## 2012-10-18 DIAGNOSIS — G4700 Insomnia, unspecified: Secondary | ICD-10-CM

## 2012-10-18 HISTORY — DX: Reserved for concepts with insufficient information to code with codable children: IMO0002

## 2012-10-18 MED ORDER — CLONAZEPAM 1 MG PO TABS: 1.0000 mg | ORAL_TABLET | Freq: Two times a day (BID) | ORAL | Status: DC | PRN

## 2012-10-18 MED ORDER — VENLAFAXINE HCL ER 150 MG PO CP24: 150.0000 mg | ORAL_CAPSULE | Freq: Every day | ORAL | Status: DC

## 2012-10-18 NOTE — Patient Instructions (Addendum)
Salon Pas Cream or patches as needed   Insomnia Insomnia is frequent trouble falling and/or staying asleep. Insomnia can be a long term problem or a short term problem. Both are common. Insomnia can be a short term problem when the wakefulness is related to a certain stress or worry. Long term insomnia is often related to ongoing stress during waking hours and/or poor sleeping habits. Overtime, sleep deprivation itself can make the problem worse. Every little thing feels more severe because you are overtired and your ability to cope is decreased. CAUSES   Stress, anxiety, and depression.  Poor sleeping habits.  Distractions such as TV in the bedroom.  Naps close to bedtime.  Engaging in emotionally charged conversations before bed.  Technical reading before sleep.  Alcohol and other sedatives. They may make the problem worse. They can hurt normal sleep patterns and normal dream activity.  Stimulants such as caffeine for several hours prior to bedtime.  Pain syndromes and shortness of breath can cause insomnia.  Exercise late at night.  Changing time zones may cause sleeping problems (jet lag). It is sometimes helpful to have someone observe your sleeping patterns. They should look for periods of not breathing during the night (sleep apnea). They should also look to see how long those periods last. If you live alone or observers are uncertain, you can also be observed at a sleep clinic where your sleep patterns will be professionally monitored. Sleep apnea requires a checkup and treatment. Give your caregivers your medical history. Give your caregivers observations your family has made about your sleep.  SYMPTOMS   Not feeling rested in the morning.  Anxiety and restlessness at bedtime.  Difficulty falling and staying asleep. TREATMENT   Your caregiver may prescribe treatment for an underlying medical disorders. Your caregiver can give advice or help if you are using alcohol or  other drugs for self-medication. Treatment of underlying problems will usually eliminate insomnia problems.  Medications can be prescribed for short time use. They are generally not recommended for lengthy use.  Over-the-counter sleep medicines are not recommended for lengthy use. They can be habit forming.  You can promote easier sleeping by making lifestyle changes such as:  Using relaxation techniques that help with breathing and reduce muscle tension.  Exercising earlier in the day.  Changing your diet and the time of your last meal. No night time snacks.  Establish a regular time to go to bed.  Counseling can help with stressful problems and worry.  Soothing music and white noise may be helpful if there are background noises you cannot remove.  Stop tedious detailed work at least one hour before bedtime. HOME CARE INSTRUCTIONS   Keep a diary. Inform your caregiver about your progress. This includes any medication side effects. See your caregiver regularly. Take note of:  Times when you are asleep.  Times when you are awake during the night.  The quality of your sleep.  How you feel the next day. This information will help your caregiver care for you.  Get out of bed if you are still awake after 15 minutes. Read or do some quiet activity. Keep the lights down. Wait until you feel sleepy and go back to bed.  Keep regular sleeping and waking hours. Avoid naps.  Exercise regularly.  Avoid distractions at bedtime. Distractions include watching television or engaging in any intense or detailed activity like attempting to balance the household checkbook.  Develop a bedtime ritual. Keep a familiar routine of bathing,  brushing your teeth, climbing into bed at the same time each night, listening to soothing music. Routines increase the success of falling to sleep faster.  Use relaxation techniques. This can be using breathing and muscle tension release routines. It can also  include visualizing peaceful scenes. You can also help control troubling or intruding thoughts by keeping your mind occupied with boring or repetitive thoughts like the old concept of counting sheep. You can make it more creative like imagining planting one beautiful flower after another in your backyard garden.  During your day, work to eliminate stress. When this is not possible use some of the previous suggestions to help reduce the anxiety that accompanies stressful situations. MAKE SURE YOU:   Understand these instructions.  Will watch your condition.  Will get help right away if you are not doing well or get worse. Document Released: 01/14/2000 Document Revised: 04/10/2011 Document Reviewed: 02/13/2007 [facility] Patient Information 2014 [place], [place].

## 2012-10-18 NOTE — Assessment & Plan Note (Signed)
Discussed sleep hygiene and try switching to Clonazepam prn

## 2012-10-18 NOTE — Assessment & Plan Note (Signed)
Trying to stall on L TKR, encouraged to try Salon Pas prn

## 2012-10-18 NOTE — Assessment & Plan Note (Signed)
Very stressed lately, will increase Venlafaxine XR to 150 mg po daily and switch from Lorazepam to Clonazepam 1 mg 1/2 to 1 tab po bid prn anxiety and insomnia

## 2012-10-18 NOTE — Progress Notes (Signed)
Patient ID: SILVA AAMODT, female   DOB: 13-Mar-1945, 67 y.o.   MRN: [id number] Shannon [name] [id number] February 06, 1945 10/18/2012      Progress Note-Follow Up  Subjective  Chief Complaint  Chief Complaint  Patient presents with  . Headache    HPI  Patient is a 67 year old Caucasian female who is in today with her adult son who has Down syndrome. After he needs the room she breaks down crying and acknowledges she has been under a great deal of stress managing him and the rest of life. She has every intention of keeping him home with his dementia and dizzy states are worsening and she is feeling overwhelmed. She's having trouble sleeping. She's also having persistent knee pain and is struggling with this. Has trouble falling asleep and staying asleep. After self labile and crying more easily. No recent illness, chest pain or palpitations. No shortness or breath GI or GU complaints noted today. Past Medical History  Diagnosis Date  . NICM (nonischemic cardiomyopathy) 03/08/2010    likely 2/2 chemotx - a. Echo 2012: EF 45-50%;  b. Lex MV 2/12:  low risk, apical defect (small area of ischemia vs shifting breast atten);  c.  Echo 7/12: Normal wall thickness, EF 60-65%, normal wall motion, grade 1 diastolic dysfunction, mild LAE, PASP 32;   d. Lex MV 11/13:  EF 76%, no ischemia  . GERD 08/11/2008  . HYPERLIPIDEMIA 08/11/2008  . HYPERTENSION 08/11/2008  . LIPOMA 02/10/2009  . PREDIABETES 06/15/2009  . 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 Carpal tunnel syndrome of left wrist   . Neuropathy   . Interstitial cystitis   . Brain tumor   . DEPRESSION 08/11/2008  . 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 Osteoporosis   . Hyponatremia 08/28/2011  . Anemia 08/28/2011  . History of chicken pox   . H/O measles   . H/O mumps   . Dermatitis   . OA (osteoarthritis) 09/07/2011  . Melanoma 2008    DR. Yetta Barre  . Shortness of breath   .  Asthma   . History of blood transfusion   . UTI (lower urinary tract infection) 03/03/2012  . Constipation 03/03/2012  . 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 Cholecystectomy    . Carpal tunnel release      L wrist, ulnar nerve moved  . Elbow surgery      left  . Tubal ligation  1997  . Lipoma excision  03/28/2009    right leg  . Portacath placement  may 2011  . Craniotomy for tumor  2000  . Tonsillectomy  1958  . Melanoma excision    . Porta cath    . Port-a-cath removal  11/30/2010    Streck  . Breast lumpectomy  04/2009    RIGHT FOR BREAST CANCER-CHEMO/RADIATION X 1 YEAR  . Total knee raplacement  01-2012    Right Knee  . Total knee arthroplasty  02/05/2012    Procedure: TOTAL KNEE ARTHROPLASTY;  Surgeon: Loanne Drilling, MD;  Location: [facility];  Service: Orthopedics;  Laterality: Right;    Family History  Problem Relation Age of Onset  . Breast cancer Mother   . Lung cancer Mother   . Breast cancer Mother     sarcoma  . Hypertension Mother   . Colon cancer Neg  Hx   . Prostate cancer Father   . Congestive Heart Failure Father   . Prostate cancer Father   . Prostate cancer Brother   . Prostate cancer Brother   . Stomach cancer Maternal Aunt   . Uterine cancer Maternal Aunt   . Down syndrome Son   . Heart disease Maternal Grandfather     MI  . Heart attack Father     History   Social History  . Marital Status: Married    Spouse Name: N/A    Number of Children: 3  . Years of Education: N/A   Occupational History  . boutique owner    Social History Main Topics  . Smoking status: Never Smoker   . Smokeless tobacco: Never Used  . Alcohol Use: No  . Drug Use: No  . Sexual Activity: No   Other Topics Concern  . Not on file   Social History Narrative  . No narrative on file    Current Outpatient Prescriptions on File Prior to Visit  Medication Sig Dispense Refill  . ATELVIA 35 MG TBEC       .  carvedilol (COREG) 6.25 MG tablet TAKE 1 TABLET (6.25 MG TOTAL) BY MOUTH 2 (TWO) TIMES DAILY WITH A MEAL.  60 tablet  6  . Ferrous Fumarate 324 MG TABS TAKE 1 TABLET BY MOUTH EVERY DAY  30 tablet  3  . folic acid (FOLVITE) 1 MG tablet TAKE 1 TABLET BY MOUTH EVERY DAY  30 tablet  3  . gabapentin (NEURONTIN) 300 MG capsule Take 300 mg by mouth at bedtime.      . hydrOXYzine (ATARAX/VISTARIL) 25 MG tablet TAKE 1 TABLET BY MOUTH EVERY DAY  30 tablet  0  . nystatin (MYCOSTATIN) powder Apply topically 4 (four) times daily. X 2-3 wks, then PRN  60 g  1  . pentosan polysulfate (ELMIRON) 100 MG capsule Take 2 capsules (200 mg total) by mouth 2 (two) times daily.  60 capsule  0  . psyllium (METAMUCIL SMOOTH TEXTURE) 28 % packet Take 1 packet by mouth daily.      . rosuvastatin (CRESTOR) 20 MG tablet Take 1 tablet (20 mg total) by mouth at bedtime.  30 tablet  6  . sucralfate (CARAFATE) 1 G tablet Take 1 g by mouth daily as needed.      Marland Kitchen telmisartan (MICARDIS) 80 MG tablet        No current facility-administered medications on file prior to visit.    No Known Allergies  Review of Systems  Review of Systems  Constitutional: Positive for malaise/fatigue. Negative for fever.  HENT: Negative for congestion.   Eyes: Negative for discharge.  Respiratory: Negative for shortness of breath.   Cardiovascular: Negative for chest pain, palpitations and leg swelling.  Gastrointestinal: Negative for nausea, abdominal pain and diarrhea.  Genitourinary: Negative for dysuria.  Musculoskeletal: Negative for falls.  Skin: Negative for rash.  Neurological: Negative for loss of consciousness and headaches.  Endo/Heme/Allergies: Negative for polydipsia.  Psychiatric/Behavioral: Positive for depression. Negative for suicidal ideas. The patient is nervous/anxious and has insomnia.     Objective  BP 138/96  Pulse 78  Temp(Src) 98.2 F (36.8 C) (Oral)  Ht 5\' 3"  (1.6 m)  Wt 197 lb 1.3 oz (89.395 kg)  BMI 34.92  kg/m2  SpO2 98%  LMP 01/30/1994  Physical Exam  Physical Exam  Constitutional: She is oriented to person, place, and time and well-developed, well-nourished, and in no distress. No distress.  HENT:  Head: Normocephalic and atraumatic.  Eyes: Conjunctivae are normal.  Neck: Neck supple. No thyromegaly present.  Cardiovascular: Normal rate, regular rhythm and normal heart sounds.   No murmur heard. Pulmonary/Chest: Effort normal and breath sounds normal. She has no wheezes.  Abdominal: She exhibits no distension and no mass.  Musculoskeletal: She exhibits no edema.  Lymphadenopathy:    She has no cervical adenopathy.  Neurological: She is alert and oriented to person, place, and time.  Skin: Skin is warm and dry. No rash noted. She is not diaphoretic.  Psychiatric: Memory, affect and judgment normal.  tearful    Lab Results  Component Value Date   TSH 1.96 12/05/2011   Lab Results  Component Value Date   WBC 5.9 10/08/2012   HGB 11.7 10/08/2012   HCT 35.3 10/08/2012   MCV 88.2 10/08/2012   PLT 237 10/08/2012   Lab Results  Component Value Date   CREATININE 0.8 10/08/2012   BUN 11.3 10/08/2012   NA 141 10/08/2012   K 4.1 10/08/2012   CL 107 04/08/2012   CO2 29 10/08/2012   Lab Results  Component Value Date   ALT 25 10/08/2012   AST 23 10/08/2012   ALKPHOS 94 10/08/2012   BILITOT 0.46 10/08/2012   Lab Results  Component Value Date   CHOL 185 12/05/2011   Lab Results  Component Value Date   HDL 44.50 12/05/2011   Lab Results  Component Value Date   LDLCALC 62 06/09/2009   Lab Results  Component Value Date   TRIG 235.0* 12/05/2011   Lab Results  Component Value Date   CHOLHDL 4 12/05/2011     Assessment & Plan  HYPERTENSION Mild elevation today with significant distress. Patient very tearful in visit. Will monitor  DEPRESSION Very stressed lately, will increase Venlafaxine XR to 150 mg po daily and switch from Lorazepam to Clonazepam 1 mg 1/2 to 1 tab po bid prn anxiety and  insomnia  OA (osteoarthritis) Trying to stall on L TKR, encouraged to try Salon Pas prn  Insomnia Discussed sleep hygiene and try switching to Clonazepam prn

## 2012-10-18 NOTE — Assessment & Plan Note (Signed)
Mild elevation today with significant distress. Patient very tearful in visit. Will monitor

## 2012-10-21 ENCOUNTER — Telehealth: Payer: Self-pay | Admitting: Oncology

## 2012-10-21 ENCOUNTER — Ambulatory Visit (HOSPITAL_BASED_OUTPATIENT_CLINIC_OR_DEPARTMENT_OTHER): Payer: Medicare Other | Admitting: Oncology

## 2012-10-21 VITALS — BP 122/79 | HR 83 | Temp 98.3°F | Resp 18 | Ht 63.0 in | Wt 197.7 lb

## 2012-10-21 DIAGNOSIS — Z853 Personal history of malignant neoplasm of breast: Secondary | ICD-10-CM | POA: Insufficient documentation

## 2012-10-21 DIAGNOSIS — C50311 Malignant neoplasm of lower-inner quadrant of right female breast: Secondary | ICD-10-CM | POA: Insufficient documentation

## 2012-10-21 NOTE — Progress Notes (Signed)
ID: Shannon [name]   DOB: May 13, 1945  MR#: [id number]  CSN#:[id number]  PCP: Danise Edge, MD GYN: SU:  OTHER MD: Ollen Gross   HISTORY OF PRESENT ILLNESS: Shannon [name] had a negative screening mammogram in July of 2010.  About a month ago though she felt a change in the lower aspect of the right breast and brought this to Dr. Tenna Child attention.  He set her up for a diagnostic mammography on April 2011 and Dr. Renato Gails found an interval mass with ill defined margins, deep in the lower, inner right breast.  This was palpable at the 3:30 position, approximately 9 cm from the nipple.  There were no palpable right axillary lymph nodes and by ultrasound there were no abnormal lymph nodes in the right axilla.  The ultrasound did show a 1.7 cm, irregular hypoechoic mass, just beneath the skin.  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  The tumor cells were estrogen receptor and progesterone receptor negative, both at 0%.  The MIB-1 was elevated at 57% and the HER2/neu ratio by CISH was positive at 2.74. Her subsequent history is as detailed below  INTERVAL HISTORY: Shannon [name] returns today for follow up of her breast cancer. The interval history is significant for having had a right knee replacement and consideration of the left. She just had a rooster comb Injection in the left knee but so far it hasn't helped very much.  REVIEW OF SYSTEMS: What is really bothering her is her disabled son at home, we Down syndrome, which is now developing Downs associated dementia. They are seen Zollie Pee at [facility] and that seems to be helping. She sleeps poorly, and her venlafaxine and lorazepam both were recently increased. She describes herself is anxious and depressed. Over the last couple of weeks she was having severe headaches that were very persistent, and she had a brain MRI which  fortunately showed no evidence of cancer. Otherwise, aside from the issues relating to the knees and lack of exercise, a detailed review of systems today was noncontributory  PAST MEDICAL HISTORY: Past Medical History  Diagnosis Date  . NICM (nonischemic cardiomyopathy) 03/08/2010    likely 2/2 chemotx - a. Echo 2012: EF 45-50%;  b. Lex MV 2/12:  low risk, apical defect (small area of ischemia vs shifting breast atten);  c.  Echo 7/12: Normal wall thickness, EF 60-65%, normal wall motion, grade 1 diastolic dysfunction, mild LAE, PASP 32;   d. Lex MV 11/13:  EF 76%, no ischemia  . GERD 08/11/2008  . HYPERLIPIDEMIA 08/11/2008  . HYPERTENSION 08/11/2008  . LIPOMA 02/10/2009  . PREDIABETES 06/15/2009  . 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 Carpal tunnel syndrome of left wrist   . Neuropathy   . Interstitial cystitis   . Brain tumor   . DEPRESSION 08/11/2008  . 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 Osteoporosis   . Hyponatremia 08/28/2011  . Anemia 08/28/2011  . History of chicken pox   . H/O measles   . H/O mumps   . Dermatitis   . OA (osteoarthritis) 09/07/2011  . Melanoma 2008    DR. Yetta Barre  . Shortness of breath   . Asthma   . History of blood transfusion   . UTI (lower urinary tract infection) 03/03/2012  .  Constipation 03/03/2012  . 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 Nita Sells.  We will try to get that report.  She also had a craniopharyngioma removed by Dr. Shirlean Kelly about 11 years ago.  She required no radiation for this (she presented with headaches and amaurosis of the right eye).  She has just undergone carpal tunnel release on the left by Dr. Josephine Igo and is recovering well from that.  She had a lipoma removed from her right thigh by Dr. Jamey Ripa.  She has a history of cholecystectomy, a history  of prior breast cyst removal, a history of interstitial cystitis, followed by Dr. Logan Bores, history of hypertension, history of hypercholesterolemia, history of mild GERD/HH, history of childhood asthma, resolved, history of osteoarthritis involving both knees followed by Dr. Ollen Gross and history of osteopenias noted on bone density obtained 2009.  PAST SURGICAL HISTORY: Past Surgical History  Procedure Laterality Date  . Cholecystectomy    . Carpal tunnel release      L wrist, ulnar nerve moved  . Elbow surgery      left  . Tubal ligation  1997  . Lipoma excision  03/28/2009    right leg  . Portacath placement  may 2011  . Craniotomy for tumor  2000  . Tonsillectomy  1958  . Melanoma excision    . Porta cath    . Port-a-cath removal  11/30/2010    Streck  . Breast lumpectomy  04/2009    RIGHT FOR BREAST CANCER-CHEMO/RADIATION X 1 YEAR  . Total knee raplacement  01-2012    Right Knee  . Total knee arthroplasty  02/05/2012    Procedure: TOTAL KNEE ARTHROPLASTY;  Surgeon: Loanne Drilling, MD;  Location: [facility];  Service: Orthopedics;  Laterality: Right;    FAMILY HISTORY Family History  Problem Relation Age of Onset  . Breast cancer Mother   . Lung cancer Mother   . Breast cancer Mother     sarcoma  . Hypertension Mother   . Colon cancer Neg Hx   . Prostate cancer Father   . Congestive Heart Failure Father   . Prostate cancer Father   . Prostate cancer Brother   . Prostate cancer Brother   . Stomach cancer Maternal Aunt   . Uterine cancer Maternal Aunt   . Down syndrome Son   . Heart disease Maternal Grandfather     MI  . Heart attack Father   The patient's father is alive at age 71.  He lives in a residential home, but the patient consider herself as his primary caregiver.  The patient's mother had breast cancer diagnosed at the age of 71.  She developed lower extremity sarcoma 10 years later, which eventually took her life.  The patient has one brother and one sister  both in good health.  GYNECOLOGIC HISTORY: She is P3, first pregnancy to term age 23.  Change of life around age 71.  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  Her husband Rayna Sexton is an English as a second language teacher.  Daughter Lorene Dy is a Engineer, civil (consulting) at [employer] in [place].  Son Nida Boatman  works as an Radio broadcast assistant, actually not with his dad.  The third child is [name], lives at home, has  Down's syndrome.  The patient attends [employer].   ADVANCED DIRECTIVES: In place  HEALTH MAINTENANCE: History  Substance Use Topics  . Smoking status: Never Smoker   . Smokeless tobacco: Never Used  . Alcohol Use: No     Colonoscopy:  PAP:  Bone density:  Lipid panel:  No Known Allergies  Current Outpatient Prescriptions  Medication Sig Dispense Refill  . amoxicillin (AMOXIL) 500 MG capsule       . aspirin 81 MG tablet Take 81 mg by mouth daily.      . ATELVIA 35 MG TBEC       . carvedilol (COREG) 6.25 MG tablet TAKE 1 TABLET (6.25 MG TOTAL) BY MOUTH 2 (TWO) TIMES DAILY WITH A MEAL.  60 tablet  6  . clonazePAM (KLONOPIN) 1 MG tablet Take 1 tablet (1 mg total) by mouth 2 (two) times daily as needed for anxiety (insomnia).  40 tablet  1  . Ferrous Fumarate 324 MG TABS TAKE 1 TABLET BY MOUTH EVERY DAY  30 tablet  3  . folic acid (FOLVITE) 1 MG tablet TAKE 1 TABLET BY MOUTH EVERY DAY  30 tablet  3  . gabapentin (NEURONTIN) 300 MG capsule Take 300 mg by mouth at bedtime.      . hydrOXYzine (ATARAX/VISTARIL) 25 MG tablet TAKE 1 TABLET BY MOUTH EVERY DAY  30 tablet  0  . nystatin (MYCOSTATIN) powder Apply topically 4 (four) times daily. X 2-3 wks, then PRN  60 g  1  . pentosan polysulfate (ELMIRON) 100 MG capsule Take 2 capsules (200 mg total) by mouth 2 (two) times daily.  60 capsule  0  . psyllium (METAMUCIL SMOOTH TEXTURE) 28  % packet Take 1 packet by mouth daily.      . rosuvastatin (CRESTOR) 20 MG tablet Take 1 tablet (20 mg total) by mouth at bedtime.  30 tablet  6  . sucralfate (CARAFATE) 1 G tablet Take 1 g by mouth daily as needed.      Marland Kitchen telmisartan (MICARDIS) 80 MG tablet       . venlafaxine XR (EFFEXOR XR) 150 MG 24 hr capsule Take 1 capsule (150 mg total) by mouth daily.  30 capsule  3   No current facility-administered medications for this visit.    OBJECTIVE: Middle-aged white woman who appears stated age  24 Vitals:   10/21/12 1348  BP: 122/79  Pulse: 83  Temp: 98.3 F (36.8 C)  Resp: 18     Body mass index is 35.03 kg/(m^2).    ECOG FS: 1 Filed Weights   10/21/12 1348  Weight: 197 lb 11.2 oz (89.676 kg)   Sclerae unicteric, pupils equal and round and reactive to light Oropharynx clear No cervical or supraclavicular adenopathy Lungs clear to auscultation, with no rales or rhonchi Heart regular rate and rhythm, no murmur appreciated Abdomen soft, obese, nontender to palpation, positive bowel sounds MSK no focal spinal tenderness, no joint edema or erythema Neuro: nonfocal, well oriented, where he affect Breasts: The right breast is status post lumpectomy, with no evidence of local recurrence. The right axilla is benign. Left breast is unremarkable.  LAB RESULTS: Lab Results  Component Value Date   WBC 5.9 10/08/2012   NEUTROABS 4.1 10/08/2012   HGB 11.7 10/08/2012   HCT 35.3 10/08/2012   MCV 88.2 10/08/2012   PLT 237 10/08/2012      Chemistry      Component Value Date/Time   NA 141 10/08/2012 1202  NA 131* 02/11/2012 1515   NA 144 10/20/2010 0854   K 4.1 October 10, 2012 1202   K 3.9 02/11/2012 1515   K 4.2 10/20/2010 0854   CL 107 04/08/2012 1018   CL 94* 02/11/2012 1515   CL 108 10/20/2010 0854   CO2 29 10/10/2012 1202   CO2 31 02/11/2012 1515   CO2 26 10/20/2010 0854   BUN 11.3 10/10/12 1202   BUN 16 02/11/2012 1515   BUN 13 10/20/2010 0854   CREATININE 0.8 10-10-12 1202   CREATININE 1.09  02/11/2012 1515   CREATININE 0.9 10/20/2010 0854      Component Value Date/Time   CALCIUM 9.3 10/10/12 1202   CALCIUM 8.6 02/11/2012 1515   CALCIUM 9.3 03/08/2011 1447   CALCIUM 8.8 10/20/2010 0854   ALKPHOS 94 10-Oct-2012 1202   ALKPHOS 94 01/29/2012 1225   ALKPHOS 106* 10/20/2010 0854   AST 23 10-Oct-2012 1202   AST 26 01/29/2012 1225   AST 32 10/20/2010 0854   ALT 25 Oct 10, 2012 1202   ALT 26 01/29/2012 1225   ALT 31 10/20/2010 0854   BILITOT 0.46 10-10-12 1202   BILITOT 0.3 01/29/2012 1225   BILITOT 0.50 10/20/2010 0854       Lab Results  Component Value Date   LABCA2 19 10/03/2011     STUDIES: Mr Laqueta Jean ZO Contrast  2012-10-10   *RADIOLOGY REPORT*  Clinical Data: Increasing headaches.  History of breast cancer, melanoma, and craniopharyngioma.  MRI HEAD WITHOUT AND WITH CONTRAST  Technique:  Multiplanar, multiecho pulse sequences of the brain and surrounding structures were obtained according to standard protocol without and with intravenous contrast  Contrast: 18mL MULTIHANCE GADOBENATE DIMEGLUMINE 529 MG/ML IV SOLN  Comparison: CT head without contrast 02/11/2012.  MRI brain without and with contrast 08/09/2011.  Findings: No acute infarct, hemorrhage, mass lesion is present. The patient is status post right frontal craniotomy.  There is no evidence for residual or recurrent tumor.  The ventricles are of normal size.  No significant extra-axial fluid collection is present.  Flow is present in the major intracranial arteries.  The globes orbits are intact.  The paranasal sinuses and mastoid air cells are clear.  The pituitary is of normal size without evidence for sellar or suprasellar mass.  IMPRESSION:  1.  Status post right frontal craniotomy without evidence for residual or recurrent tumor. 2.  Essentially normal appearance of the brain otherwise.   Original Report Authenticated By: Marin Roberts, M.D.     02/11/2012 *RADIOLOGY REPORT*  Clinical Data: Loss of consciousness.   PORTABLE CHEST - 1 VIEW  Comparison: 09/16/2011 and a chest CT dated 10/03/2011  Findings: Heart size and vascularity are normal and the lungs are  clear. Chronic elevation of the right hemidiaphragm, unchanged  since the prior chest CT.  IMPRESSION:  No acute disease.  Original Report Authenticated By: Francene Boyers, M.D.     ASSESSMENT: 67 y.o. [facility] woman status post right breast biopsy April 2011 for a clinical T2 N0 grade 3 invasive ductal carcinoma which was estrogen and progesterone receptor negative, HER-2/neu positive with an MIB-1 of 58%  (1) treated neoadjuvantly with multiple agents (due to tolerance issues):  Namely 2 cycles of standard dose paclitaxel/ carboplatin/ trastuzumab followed by 3 cycles of paclitaxel/ trastuzumab given weekly, the last of this given with weekly carboplatin, followed by 2 cycles of dose dense doxorubicin and cyclophosphamide with the doxorubicin given by continuous infusion all of this completed in August 2011  (2) status  post definitive right lumpectomy and sentinel lymph node sampling September 2011 showing a complete pathologic response both in the breast and sampled lymph nodes.    (3) completed radiation therapy November 2011.    (4) continued to receive trastuzumab until January 2012 at which time it was discontinued secondary to a drop in her ejection fraction which has now completely resolved.   PLAN:  Dniya is doing fine from a breast cancer point of view and she understands that, having attained a complete pathologic response, she has a good prognosis. I urged her accordingly to "plan long-term", and glad and get her left knee taking care of so she can exercise regularly. [facility] the [facility] and [facility] gave her a pamphlet regarding that. At least water aerobic exercises I think would be helpful.  The plan is to continue to follow her an additional 2 years then likely release her from  followup.  Pranathi is a very good understanding of what is involved in this plan. She is in agreement with that. She knows to call for any problems that may develop before next visit here. Teonna Coonan C    10/21/2012

## 2012-10-21 NOTE — Telephone Encounter (Signed)
, °

## 2012-11-21 DIAGNOSIS — IMO0002 Reserved for concepts with insufficient information to code with codable children: Secondary | ICD-10-CM | POA: Diagnosis not present

## 2012-11-21 DIAGNOSIS — M1710 Unilateral primary osteoarthritis, unspecified knee: Secondary | ICD-10-CM | POA: Diagnosis not present

## 2012-11-25 ENCOUNTER — Other Ambulatory Visit: Payer: Self-pay | Admitting: Family Medicine

## 2012-12-05 ENCOUNTER — Telehealth: Payer: Self-pay | Admitting: Family Medicine

## 2012-12-05 NOTE — Telephone Encounter (Signed)
Left a message for patient to return my call. 

## 2012-12-05 NOTE — Telephone Encounter (Signed)
PATIENT REQUESTING CHRISTY TO CALL HER

## 2012-12-12 ENCOUNTER — Ambulatory Visit: Payer: Medicare Other | Admitting: Family Medicine

## 2012-12-23 ENCOUNTER — Other Ambulatory Visit: Payer: Self-pay | Admitting: *Deleted

## 2012-12-23 MED ORDER — FOLIC ACID 1 MG PO TABS: ORAL_TABLET | ORAL | Status: DC

## 2012-12-23 NOTE — Telephone Encounter (Signed)
Rx request to pharmacy/SLS  

## 2012-12-28 ENCOUNTER — Other Ambulatory Visit: Payer: Self-pay | Admitting: Family Medicine

## 2012-12-30 ENCOUNTER — Ambulatory Visit (INDEPENDENT_AMBULATORY_CARE_PROVIDER_SITE_OTHER): Payer: Medicare Other | Admitting: Cardiology

## 2012-12-30 ENCOUNTER — Encounter: Payer: Self-pay | Admitting: Cardiology

## 2012-12-30 VITALS — BP 130/84 | HR 86 | Ht 64.0 in | Wt 202.0 lb

## 2012-12-30 DIAGNOSIS — I428 Other cardiomyopathies: Secondary | ICD-10-CM

## 2012-12-30 DIAGNOSIS — I10 Essential (primary) hypertension: Secondary | ICD-10-CM

## 2012-12-30 DIAGNOSIS — Z01810 Encounter for preprocedural cardiovascular examination: Secondary | ICD-10-CM | POA: Diagnosis not present

## 2012-12-30 DIAGNOSIS — I420 Dilated cardiomyopathy: Secondary | ICD-10-CM | POA: Insufficient documentation

## 2012-12-30 HISTORY — DX: Dilated cardiomyopathy: I42.0

## 2012-12-30 NOTE — Progress Notes (Signed)
HPI: 67 year old female previously followed by Dr. Daleen Squibb for followup of nonischemic cardiomyopathy. Patient had an echocardiogram in 2012 ejection fraction of 45-50%. Reduced LV function felt secondary to chemotherapy (herceptin). Last echocardiogram in July of 2012 showed normal LV function. Nuclear study November 2013 was normal with an ejection fraction of 76%. Last seen 4/14. Since then, the patient has dyspnea with more extreme activities but not with routine activities. It is relieved with rest. It is not associated with chest pain. There is no orthopnea, PND or pedal edema. There is no syncope or palpitations. There is no exertional chest pain. Note she is scheduled to have left knee replacement and we were asked to evaluate preoperatively.   Current Outpatient Prescriptions  Medication Sig Dispense Refill  . aspirin 81 MG tablet Take 81 mg by mouth daily.      . ATELVIA 35 MG TBEC       . carvedilol (COREG) 6.25 MG tablet TAKE 1 TABLET (6.25 MG TOTAL) BY MOUTH 2 (TWO) TIMES DAILY WITH A MEAL.  60 tablet  6  . clonazePAM (KLONOPIN) 1 MG tablet TAKE 1 TABLET BY MOUTH TWICE A DAY AS NEEDED FOR ANXIETY  40 tablet  1  . Ferrous Fumarate 324 MG TABS TAKE 1 TABLET BY MOUTH EVERY DAY  30 tablet  3  . folic acid (FOLVITE) 1 MG tablet TAKE 1 TABLET BY MOUTH EVERY DAY  30 tablet  3  . gabapentin (NEURONTIN) 300 MG capsule Take 300 mg by mouth at bedtime.      . hydrOXYzine (ATARAX/VISTARIL) 25 MG tablet TAKE 1 TABLET BY MOUTH EVERY DAY  30 tablet  0  . pentosan polysulfate (ELMIRON) 100 MG capsule Take 2 capsules (200 mg total) by mouth 2 (two) times daily.  60 capsule  0  . psyllium (METAMUCIL SMOOTH TEXTURE) 28 % packet Take 1 packet by mouth daily.      . rosuvastatin (CRESTOR) 20 MG tablet Take 1 tablet (20 mg total) by mouth at bedtime.  30 tablet  6  . sucralfate (CARAFATE) 1 G tablet Take 1 g by mouth daily as needed.      Marland Kitchen telmisartan (MICARDIS) 80 MG tablet TAKE 1 TABLET BY MOUTH  EVERY DAY  30 tablet  1  . venlafaxine XR (EFFEXOR XR) 150 MG 24 hr capsule Take 1 capsule (150 mg total) by mouth daily.  30 capsule  3  . nystatin (MYCOSTATIN) powder Apply topically 4 (four) times daily. X 2-3 wks, then PRN  60 g  1   No current facility-administered medications for this visit.     Past Medical History  Diagnosis Date  . NICM (nonischemic cardiomyopathy) 03/08/2010    likely 2/2 chemotx - a. Echo 2012: EF 45-50%;  b. Lex MV 2/12:  low risk, apical defect (small area of ischemia vs shifting breast atten);  c.  Echo 7/12: Normal wall thickness, EF 60-65%, normal wall motion, grade 1 diastolic dysfunction, mild LAE, PASP 32;   d. Lex MV 11/13:  EF 76%, no ischemia  . GERD 08/11/2008  . HYPERLIPIDEMIA 08/11/2008  . HYPERTENSION 08/11/2008  . LIPOMA 02/10/2009  . PREDIABETES 06/15/2009  . 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 Carpal tunnel syndrome of left wrist   . Neuropathy   . Interstitial cystitis   . Brain tumor   . DEPRESSION 08/11/2008  . Hx breast cancer, IDC, Right, 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/2009    BRCA 2 NEGATIVE,  CHEMO AND RADIATION X 1 YR  . Osteoporosis   . Hyponatremia 08/28/2011  . Anemia 08/28/2011  . History of chicken pox   . H/O measles   . H/O mumps   . Dermatitis   . OA (osteoarthritis) 09/07/2011  . Melanoma 2008    DR. Yetta Barre  . Shortness of breath   . Asthma   . History of blood transfusion   . UTI (lower urinary tract infection) 03/03/2012  . Constipation 03/03/2012  . 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 Cholecystectomy    . Carpal tunnel release      L wrist, ulnar nerve moved  . Elbow surgery      left  . Tubal ligation  1997  . Lipoma excision  03/28/2009    right leg  . Portacath placement  may 2011  . Craniotomy for tumor  2000  . Tonsillectomy  1958  . Melanoma excision    . Porta cath    . Port-a-cath removal  11/30/2010    Streck  . Breast lumpectomy  04/2009     RIGHT FOR BREAST CANCER-CHEMO/RADIATION X 1 YEAR  . Total knee raplacement  01-2012    Right Knee  . Total knee arthroplasty  02/05/2012    Procedure: TOTAL KNEE ARTHROPLASTY;  Surgeon: Loanne Drilling, MD;  Location: [facility];  Service: Orthopedics;  Laterality: Right;    History   Social History  . Marital Status: Married    Spouse Name: N/A    Number of Children: 3  . Years of Education: N/A   Occupational History  . boutique owner    Social History Main Topics  . Smoking status: Never Smoker   . Smokeless tobacco: Never Used  . Alcohol Use: No  . Drug Use: No  . Sexual Activity: No   Other Topics Concern  . Not on file   Social History Narrative  . No narrative on file    ROS: left knee arthralgias but no fevers or chills, productive cough, hemoptysis, dysphasia, odynophagia, melena, hematochezia, dysuria, hematuria, rash, seizure activity, orthopnea, PND, pedal edema, claudication. Remaining systems are negative.  Physical Exam: Well-developed obese in no acute distress.  Skin is warm and dry.  HEENT is normal.  Neck is supple.  Chest is clear to auscultation with normal expansion.  Cardiovascular exam is regular rate and rhythm.  Abdominal exam nontender or distended. No masses palpated. Extremities show no edema. neuro grossly intact  ECG sinus rhythm at a rate of 86. Low-voltage. Cannot rule out prior  Anterior infarct.

## 2012-12-30 NOTE — Telephone Encounter (Signed)
rx sent

## 2012-12-30 NOTE — Patient Instructions (Signed)
Your physician wants you to follow-up in: one year with Dr Jens Som. You will receive a reminder letter in the mail two months in advance. If you don't receive a letter, please call our office to schedule the follow-up appointment.

## 2012-12-30 NOTE — Assessment & Plan Note (Signed)
Previous nonischemic cardiomyopathy felt secondary to Herceptin. Her LV function is now normal. Continue beta blocker and ARB.

## 2012-12-30 NOTE — Assessment & Plan Note (Signed)
Patient is doing well from a symptomatic standpoint. Recent nuclear study negative. No indication for further preoperative evaluation. She may proceed.

## 2012-12-30 NOTE — Assessment & Plan Note (Signed)
Continue present blood pressure medications. 

## 2012-12-30 NOTE — Telephone Encounter (Signed)
eScribe request for refill on Clonazepam 1 mg Last filled - 09.19.14, #[id number] Last AEX - 09.19.14 Next AEX - 2 Months [Canceled 11.13.14 appt, no f/u re-scheduled] Please Advise/SLS

## 2013-01-10 ENCOUNTER — Ambulatory Visit (INDEPENDENT_AMBULATORY_CARE_PROVIDER_SITE_OTHER): Payer: Medicare Other | Admitting: Women's Health

## 2013-01-10 ENCOUNTER — Encounter: Payer: Self-pay | Admitting: Women's Health

## 2013-01-10 VITALS — BP 122/74 | Ht 62.75 in | Wt 203.0 lb

## 2013-01-10 DIAGNOSIS — M810 Age-related osteoporosis without current pathological fracture: Secondary | ICD-10-CM | POA: Diagnosis not present

## 2013-01-10 MED ORDER — RISEDRONATE SODIUM 35 MG PO TABS: 35.0000 mg | ORAL_TABLET | ORAL | Status: DC

## 2013-01-10 NOTE — Progress Notes (Signed)
Shannon [name] Aug 29, 1945 [id number]    History:    The patient presents for breast and pelvic exam.  Postmenopausal/no HRT/no bleeding. Right breast cancer 2011 lumpectomy, radiation, chemotherapy. BRCA negative. Osteoporosis T score -2.6 on Atelvia for 2 years. 2000  benign brain tumor,  normal brain MRI 09/2012. Knee replacement 01/2012 is planning the other knee 01/2013. Negative colonoscopy 2011. Normal Pap history.  Past medical history, past surgical history, family history and social history were all reviewed and documented in the EPIC chart. 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 Shannon [name], Down's and increasing memory problems. Melanoma 2008. Hypertension/hypercholesterolemia/IC managed primary care. Mother breast and lung cancer.  Exam:  Filed Vitals:   01/10/13 1042  BP: 122/74    General appearance:  Normal Head/Neck:  Normal, without cervical or supraclavicular adenopathy. Thyroid:  Symmetrical, normal in size, without palpable masses or nodularity. Respiratory  Effort:  Normal  Auscultation:  Clear without wheezing or rhonchi Cardiovascular  Auscultation:  Regular rate, without rubs, murmurs or gallops  Edema/varicosities:  Not grossly evident Abdominal  Soft,nontender, without masses, guarding or rebound.  Liver/spleen:  No organomegaly noted  Hernia:  None appreciated  Skin  Inspection:  Grossly normal  Palpation:  Grossly normal Neurologic/psychiatric  Orientation:  Normal with appropriate conversation.  Mood/affect:  Normal  Genitourinary    Breasts: Examined lying and sitting.     Right: Without masses, retractions, discharge or axillary adenopathy.     Left: Without masses, retractions, discharge or axillary adenopathy.   Inguinal/mons:  Normal without inguinal adenopathy  External genitalia:  Normal  BUS/Urethra/Skene's glands:  Normal  Bladder:  Normal  Vagina:  Normal  Cervix:  Normal  Uterus:   normal in size, shape and contour.  Midline and  mobile  Adnexa/parametria:     Rt: Without masses or tenderness.   Lt: Without masses or tenderness.  Anus and perineum: Normal  Digital rectal exam: Normal sphincter tone without palpated masses or tenderness  Assessment/Plan:  67 y.o.MWF G3P3  for breast and pelvic exam.  Right breast cancer 2011 2013 Osteoporosis on Alelvia Melanoma 2008 Hypertension/hypercholesterolemia/IC-primary care manages labs and meds  Plan: Actonel 35 weekly, prescription, proper use given and reviewed. Requested a generic do to cost. Reviewed not as well absorbed, reviewed importance of regular exercise, calcium rich diet, vitamin D 2000 daily. Home safety and fall prevention discussed. SBE's, continue annual mammogram. Repeat DEXA . Continue annual skin check. Pap normal 2013, new screening guidelines reviewed.   Shannon [name] [facility], 12:44 PM 01/10/2013

## 2013-01-10 NOTE — Patient Instructions (Signed)
Health Recommendations for Postmenopausal Women Respected and ongoing research has looked at the most common causes of death, disability, and poor quality of life in postmenopausal women. The causes include heart disease, diseases of blood vessels, diabetes, depression, cancer, and bone loss (osteoporosis). Many things can be done to help lower the chances of developing these and other common problems: CARDIOVASCULAR DISEASE Heart Disease: A heart attack is a medical emergency. Know the signs and symptoms of a heart attack. Below are things women can do to reduce their risk for heart disease.   Do not smoke. If you smoke, quit.  Aim for a healthy weight. Being overweight causes many preventable deaths. Eat a healthy and balanced diet and drink an adequate amount of liquids.  Get moving. Make a commitment to be more physically active. Aim for 30 minutes of activity on most, if not all days of the week.  Eat for heart health. Choose a diet that is low in saturated fat and cholesterol and eliminate trans fat. Include whole grains, vegetables, and fruits. Read and understand the labels on food containers before buying.  Know your numbers. Ask your caregiver to check your blood pressure, cholesterol (total, HDL, LDL, triglycerides) and blood glucose. Work with your caregiver on improving your entire clinical picture.  High blood pressure. Limit or stop your table salt intake (try salt substitute and food seasonings). Avoid salty foods and drinks. Read labels on food containers before buying. Eating well and exercising can help control high blood pressure. STROKE  Stroke is a medical emergency. Stroke may be the result of a blood clot in a blood vessel in the brain or by a brain hemorrhage (bleeding). Know the signs and symptoms of a stroke. To lower the risk of developing a stroke:  Avoid fatty foods.  Quit smoking.  Control your diabetes, blood pressure, and irregular heart rate. THROMBOPHLEBITIS  (BLOOD CLOT) OF THE LEG  Becoming overweight and leading a stationary lifestyle may also contribute to developing blood clots. Controlling your diet and exercising will help lower the risk of developing blood clots. CANCER SCREENING  Breast Cancer: Take steps to reduce your risk of breast cancer.  You should practice "breast self-awareness." This means understanding the normal appearance and feel of your breasts and should include breast self-examination. Any changes detected, no matter how small, should be reported to your caregiver.  After age 40, you should have a clinical breast exam (CBE) every year.  Starting at age 40, you should consider having a mammogram (breast X-ray) every year.  If you have a family history of breast cancer, talk to your caregiver about genetic screening.  If you are at high risk for breast cancer, talk to your caregiver about having an MRI and a mammogram every year.  Intestinal or Stomach Cancer: Tests to consider are a rectal exam, fecal occult blood, sigmoidoscopy, and colonoscopy. Women who are high risk may need to be screened at an earlier age and more often.  Cervical Cancer:  Beginning at age 30, you should have a Pap test every 3 years as long as the past 3 Pap tests have been normal.  If you have had past treatment for cervical cancer or a condition that could lead to cancer, you need Pap tests and screening for cancer for at least 20 years after your treatment.  If you had a hysterectomy for a problem that was not cancer or a condition that could lead to cancer, then you no longer need Pap tests.    If you are between ages 65 and 70, and you have had normal Pap tests going back 10 years, you no longer need Pap tests.  If Pap tests have been discontinued, risk factors (such as a new sexual partner) need to be reassessed to determine if screening should be resumed.  Some medical problems can increase the chance of getting cervical cancer. In these  cases, your caregiver may recommend more frequent screening and Pap tests.  Uterine Cancer: If you have vaginal bleeding after reaching menopause, you should notify your caregiver.  Ovarian cancer: Other than yearly pelvic exams, there are no reliable tests available to screen for ovarian cancer at this time except for yearly pelvic exams.  Lung Cancer: Yearly chest X-rays can detect lung cancer and should be done on high risk women, such as cigarette smokers and women with chronic lung disease (emphysema).  Skin Cancer: A complete body skin exam should be done at your yearly examination. Avoid overexposure to the sun and ultraviolet light lamps. Use a strong sun block cream when in the sun. All of these things are important in lowering the risk of skin cancer. MENOPAUSE Menopause Symptoms: Hormone therapy products are effective for treating symptoms associated with menopause:  Moderate to severe hot flashes.  Night sweats.  Mood swings.  Headaches.  Tiredness.  Loss of sex drive.  Insomnia.  Other symptoms. Hormone replacement carries certain risks, especially in older women. Women who use or are thinking about using estrogen or estrogen with progestin treatments should discuss that with their caregiver. Your caregiver will help you understand the benefits and risks. The ideal dose of hormone replacement therapy is not known. The Food and Drug Administration (FDA) has concluded that hormone therapy should be used only at the lowest doses and for the shortest amount of time to reach treatment goals.  OSTEOPOROSIS Protecting Against Bone Loss and Preventing Fracture: If you use hormone therapy for prevention of bone loss (osteoporosis), the risks for bone loss must outweigh the risk of the therapy. Ask your caregiver about other medications known to be safe and effective for preventing bone loss and fractures. To guard against bone loss or fractures, the following is recommended:  If  you are less than age 50, take 1000 mg of calcium and at least 600 mg of Vitamin D per day.  If you are greater than age 50 but less than age 70, take 1200 mg of calcium and at least 600 mg of Vitamin D per day.  If you are greater than age 70, take 1200 mg of calcium and at least 800 mg of Vitamin D per day. Smoking and excessive alcohol intake increases the risk of osteoporosis. Eat foods rich in calcium and vitamin D and do weight bearing exercises several times a week as your caregiver suggests. DIABETES Diabetes Melitus: If you have Type I or Type 2 diabetes, you should keep your blood sugar under control with diet, exercise and recommended medication. Avoid too many sweets, starchy and fatty foods. Being overweight can make control more difficult. COGNITION AND MEMORY Cognition and Memory: Menopausal hormone therapy is not recommended for the prevention of cognitive disorders such as Alzheimer's disease or memory loss.  DEPRESSION  Depression may occur at any age, but is common in elderly women. The reasons may be because of physical, medical, social (loneliness), or financial problems and needs. If you are experiencing depression because of medical problems and control of symptoms, talk to your caregiver about this. Physical activity and   exercise may help with mood and sleep. Community and volunteer involvement may help your sense of value and worth. If you have depression and you feel that the problem is getting worse or becoming severe, talk to your caregiver about treatment options that are best for you. ACCIDENTS  Accidents are common and can be serious in the elderly woman. Prepare your house to prevent accidents. Eliminate throw rugs, place hand bars in the bath, shower and toilet areas. Avoid wearing high heeled shoes or walking on wet, snowy, and icy areas. Limit or stop driving if you have vision or hearing problems, or you feel you are unsteady with you movements and  reflexes. HEPATITIS C Hepatitis C is a type of viral infection affecting the liver. It is spread mainly through contact with blood from an infected person. It can be treated, but if left untreated, it can lead to severe liver damage over years. Many people who are infected do not know that the virus is in their blood. If you are a "baby-boomer", it is recommended that you have one screening test for Hepatitis C. IMMUNIZATIONS  Several immunizations are important to consider having during your senior years, including:   Tetanus, diptheria, and pertussis booster shot.  Influenza every year before the flu season begins.  Pneumonia vaccine.  Shingles vaccine.  Others as indicated based on your specific needs. Talk to your caregiver about these. Document Released: 03/10/2005 Document Revised: 01/03/2012 Document Reviewed: 11/04/2007 [employer] Patient Information 2014 [employer].  

## 2013-01-27 ENCOUNTER — Other Ambulatory Visit: Payer: Self-pay | Admitting: Orthopedic Surgery

## 2013-01-27 ENCOUNTER — Telehealth: Payer: Self-pay | Admitting: Family Medicine

## 2013-01-27 MED ORDER — FERROUS FUMARATE 324 MG PO TABS: ORAL_TABLET | ORAL | Status: DC

## 2013-01-27 NOTE — Telephone Encounter (Signed)
refill- ferrous fumarate 324 mg tab. Take 1 tablet by mouth every day. Qty 30 last fill 11.29.14

## 2013-02-05 ENCOUNTER — Encounter (HOSPITAL_COMMUNITY): Payer: Self-pay | Admitting: Pharmacy Technician

## 2013-02-06 ENCOUNTER — Other Ambulatory Visit (HOSPITAL_COMMUNITY): Payer: Self-pay | Admitting: Orthopedic Surgery

## 2013-02-06 NOTE — Progress Notes (Signed)
EKG with OV and Clearance Dr Stanford Breed 12/14 EPIC

## 2013-02-06 NOTE — Patient Instructions (Addendum)
[place]  02/06/2013   Your procedure is scheduled on:  02/10/13  MONDAY  Report to [place] at     12:05  PM  Call this number if you have problems the morning of surgery: [phone]       Remember:   Do not eat food :After Midnight. Sunday NIGHT-- MAY HAVE CLEAR LIQUIDS Monday MORNING UNTIL 09:00 AM,  THEN NOTHING BY MOUTH   Take these medicines the morning of surgery with A SIP OF WATER: CARVEDILOL, Gabapentin, Pentosan(Elmiron), Zantac   .  Contacts, dentures or partial plates can not be worn to surgery  Leave suitcase in the car. After surgery it may be brought to your room.  For patients admitted to the hospital, checkout time is 11:00 AM day of  discharge.             SPECIAL INSTRUCTIONS- SEE [place] PREPARING FOR SURGERY INSTRUCTION SHEET-     DO NOT WEAR JEWELRY, LOTIONS, POWDERS, OR PERFUMES.  WOMEN-- DO NOT SHAVE LEGS OR UNDERARMS FOR 12 HOURS BEFORE SHOWERS. MEN MAY SHAVE FACE.  Patients discharged the day of surgery will not be allowed to drive home. IF going home the day of surgery, you must have a driver and someone to stay with you for the first 24 hours  Name and phone number of your driver:   admission                                                                     Please read over the following fact sheets that you were given: MRSA Information, Incentive Spirometry Sheet, Blood Transfusion Sheet  Information                                                                                 [place]  PST 336  [phone]                 FAILURE TO [facility]                                                  Patient Signature _____________________________

## 2013-02-06 NOTE — Progress Notes (Signed)
Clearance Dr Charlett Blake chart

## 2013-02-07 ENCOUNTER — Encounter (HOSPITAL_COMMUNITY): Payer: Self-pay

## 2013-02-07 ENCOUNTER — Encounter (HOSPITAL_COMMUNITY)
Admission: RE | Admit: 2013-02-07 | Discharge: 2013-02-07 | Disposition: A | Discharge status: Home / Self Care | Payer: Medicare Other | Source: Ambulatory Visit | Attending: Orthopedic Surgery | Admitting: Orthopedic Surgery

## 2013-02-07 ENCOUNTER — Ambulatory Visit (HOSPITAL_COMMUNITY)
Admission: RE | Admit: 2013-02-07 | Discharge: 2013-02-07 | Disposition: A | Discharge status: Home / Self Care | Payer: Medicare Other | Source: Ambulatory Visit | Attending: Orthopedic Surgery | Admitting: Orthopedic Surgery

## 2013-02-07 DIAGNOSIS — M1710 Unilateral primary osteoarthritis, unspecified knee: Secondary | ICD-10-CM

## 2013-02-07 DIAGNOSIS — I10 Essential (primary) hypertension: Secondary | ICD-10-CM | POA: Insufficient documentation

## 2013-02-07 DIAGNOSIS — Z01812 Encounter for preprocedural laboratory examination: Secondary | ICD-10-CM

## 2013-02-07 DIAGNOSIS — J45909 Unspecified asthma, uncomplicated: Secondary | ICD-10-CM | POA: Insufficient documentation

## 2013-02-07 DIAGNOSIS — K219 Gastro-esophageal reflux disease without esophagitis: Secondary | ICD-10-CM

## 2013-02-07 DIAGNOSIS — Z01818 Encounter for other preprocedural examination: Secondary | ICD-10-CM | POA: Diagnosis not present

## 2013-02-07 LAB — CBC
HCT: 37.5 % (ref 36.0–46.0)
Hemoglobin: 11.9 g/dL — ABNORMAL LOW (ref 12.0–15.0)
MCH: 29.1 pg (ref 26.0–34.0)
MCHC: 31.7 g/dL (ref 30.0–36.0)
MCV: 91.7 fL (ref 78.0–100.0)
PLATELETS: 239 10*3/uL (ref 150–400)
RBC: 4.09 MIL/uL (ref 3.87–5.11)
RDW: 13.9 % (ref 11.5–15.5)
WBC: 4.9 10*3/uL (ref 4.0–10.5)

## 2013-02-07 LAB — COMPREHENSIVE METABOLIC PANEL
ALT: 27 U/L (ref 0–35)
AST: 26 U/L (ref 0–37)
Albumin: 3.8 g/dL (ref 3.5–5.2)
Alkaline Phosphatase: 104 U/L (ref 39–117)
BUN: 14 mg/dL (ref 6–23)
CALCIUM: 10 mg/dL (ref 8.4–10.5)
CO2: 30 mEq/L (ref 19–32)
Chloride: 105 mEq/L (ref 96–112)
Creatinine, Ser: 1.05 mg/dL (ref 0.50–1.10)
GFR calc non Af Amer: 53 mL/min — ABNORMAL LOW (ref >90)
GFR, EST AFRICAN AMERICAN: 62 mL/min — AB (ref >90)
Glucose, Bld: 90 mg/dL (ref 70–99)
Potassium: 4.8 mEq/L (ref 3.7–5.3)
SODIUM: 144 meq/L (ref 137–147)
TOTAL PROTEIN: 6.8 g/dL (ref 6.0–8.3)
Total Bilirubin: 0.2 mg/dL — ABNORMAL LOW (ref 0.3–1.2)

## 2013-02-07 LAB — URINALYSIS, ROUTINE W REFLEX MICROSCOPIC
Bilirubin Urine: NEGATIVE
Glucose, UA: NEGATIVE mg/dL
HGB URINE DIPSTICK: NEGATIVE
Ketones, ur: NEGATIVE mg/dL
Nitrite: NEGATIVE
Protein, ur: NEGATIVE mg/dL
Specific Gravity, Urine: 1.009 (ref 1.005–1.030)
Urobilinogen, UA: 0.2 mg/dL (ref 0.0–1.0)
pH: 7.5 (ref 5.0–8.0)

## 2013-02-07 LAB — APTT: aPTT: 25 seconds (ref 24–37)

## 2013-02-07 LAB — PROTIME-INR
INR: 0.91 (ref 0.00–1.49)
Prothrombin Time: 12.1 seconds (ref 11.6–15.2)

## 2013-02-07 LAB — URINE MICROSCOPIC-ADD ON

## 2013-02-07 LAB — SURGICAL PCR SCREEN
MRSA, PCR: NEGATIVE
Staphylococcus aureus: NEGATIVE

## 2013-02-07 NOTE — Progress Notes (Signed)
Faxed u/a with micro to Dr Wynelle Link via [facility]

## 2013-02-07 NOTE — Progress Notes (Signed)
02/07/13 1132  OBSTRUCTIVE SLEEP APNEA  Have you ever been diagnosed with sleep apnea through a sleep study? No  Do you snore loudly (loud enough to be heard through closed doors)?  0  Do you often feel tired, fatigued, or sleepy during the daytime? 1  Has anyone observed you stop breathing during your sleep? 1  Do you have, or are you being treated for high blood pressure? 1  BMI more than 35 kg/m2? 1  Age over 69 years old? 1  Neck circumference greater than 40 cm/18 inches? 0  Gender: 0  Obstructive Sleep Apnea Score 5  Score 4 or greater  Results sent to PCP

## 2013-02-09 ENCOUNTER — Other Ambulatory Visit: Payer: Self-pay | Admitting: Orthopedic Surgery

## 2013-02-09 NOTE — H&P (Signed)
Shannon [name]  DOB: 04/11/1945 Married / Language: English / Race: White Female  Date of Admission:  02-10-2013  Chief Complaint:  Left Knee Pain  History of Present Illness The patient is a 68 year old female who comes in for a preoperative History and Physical. The patient is scheduled for a left total knee arthroplasty to be performed by Dr. Dione Plover. Aluisio, MD at [facility] on 02-17-2013. The patient is a 68 year old female who presents for follow up of their knee. The patient is being followed for their left knee pain and osteoarthritis. They are now weeks out from synvisc injections. Symptoms reported today include: pain. The patient feels that they are doing poorly. Note for "Follow-up Knee": Patient states that she got some relief from the injections but it didn't last long at all. The patient comes in today several months out from right total knee arthroplasty. The patient states that she is doing very well at this time. control at this time and describe their pain as mild. They are currently on Tylenol for their pain. The patient is currently doing home exercise program (bike). The patient feels that they are progressing well at this time. Note for "Post TKA": Patient states that she is still having pain at night. She said the left knee is getting progressively worse. The Synvisc really did not help much. The knee feels like it shifts on her at times. It feels like it wants to give out. She has documented bone on bone disease. She has had cortisone and Synvisc which have not been helpful. She is ready to go ahead and get the left knee replaced. They have been treated conservatively in the past for the above stated problem and despite conservative measures, they continue to have progressive pain and severe functional limitations and dysfunction. They have failed non-operative management including home exercise, medications, and injections. It is felt that they would benefit  from undergoing total joint replacement. Risks and benefits of the procedure have been discussed with the patient and they elect to proceed with surgery. There are no active contraindications to surgery such as ongoing infection or rapidly progressive neurological disease.  Allergies No Known Drug Allergies. 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 Melanoma Breast Cancer. Right Sided High blood pressure Migraine Headache Osteoporosis Osteoarthritis Peripheral Neuropathy Gastroesophageal Reflux Disease Asthma Hypercholesterolemia   Family History Hypertension. First Degree Relatives. mother Cancer. mother Osteoarthritis. father Osteoporosis. mother Congestive Heart Failure. father Heart Disease. 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 Plan is for home. Children. 3 Alcohol use. current drinker; only occasionally per week Illicit drug use. no Copy of Drug/Alcohol Rehab (Previously). no Pain Contract. no Living situation. live with spouse Marital status. married Current work status. working part time Tobacco / smoke exposure. no Exercise. Exercises rarely Number of flights of stairs before winded. 2-3 Tobacco use. Never smoker. never smoker Drug/Alcohol Rehab (Currently). no   Medication History Micardis ( Oral) Specific dose unknown - Active. Ferrous Fumarate (324MG  Tablet, Oral) Active. Elmiron (100MG  Capsule, Oral) Active. ClonazePAM (1MG  Tablet, Oral)  Active. Gabapentin ( Oral) Specific dose unknown - Active. Crestor ( Oral) Specific  dose unknown - Active. Carvedilol ( Oral) Specific dose unknown - Active. HydrOXYzine HCl ( Oral) Specific dose unknown - Active. Venlafaxine HCl ( Oral) Specific dose unknown - Active. Folic Acid (1MG  Tablet, Oral) Active. Aspirin Childrens (81MG  Tablet Chewable, Oral) Active. Multivitamin ( Oral) Active. Vitamin D ( Oral) Specific dose unknown - Active. Altelvia Active.   Past Surgical History Excision of Pituitary Mass. Date: 2000. Breast Mass; Local Excision. Date: 2011. right Tubal Ligation. Date: 43. Tonsillectomy Carpal Tunnel Repair. left Cataract Surgery. right Gallbladder Surgery. laporoscopic Colon Polyp Removal - Colonoscopy Total Knee Replacement - Right. Date: 2014.   Review of Systems General:Not Present- Chills, Fever, Night Sweats, Fatigue, Weight Gain, Weight Loss and Memory Loss. Skin:Not Present- Hives, Itching, Rash, Eczema and Lesions. HEENT:Not Present- Tinnitus, Headache, Double Vision, Visual Loss, Hearing Loss and Dentures. Respiratory:Not Present- Shortness of breath with exertion, Shortness of breath at rest, Allergies, Coughing up blood and Chronic Cough. Cardiovascular:Not Present- Chest Pain, Racing/skipping heartbeats, Difficulty Breathing Lying Down, Murmur, Swelling and Palpitations. Gastrointestinal:Not Present- Bloody Stool, Heartburn, Abdominal Pain, Vomiting, Nausea, Constipation, Diarrhea, Difficulty Swallowing, Jaundice and Loss of appetitie. Female Genitourinary:Present- Urinary frequency. Not Present- Blood in Urine, Weak urinary stream, Discharge, Flank Pain, Incontinence, Painful Urination, Urgency, Urinary Retention and Urinating at Night. Musculoskeletal:Present- Joint Pain, Back Pain and Morning Stiffness. Not Present- Muscle Weakness, Muscle Pain, Joint Swelling and Spasms. Neurological:Not Present- Tremor, Dizziness, Blackout  spells, Paralysis, Difficulty with balance and Weakness. Psychiatric:Not Present- Insomnia.    Vitals Weight: 199 lb Height: 63 in Weight was reported by patient. Height was reported by patient. Body Surface Area: 2 m Body Mass Index: 35.25 kg/m Pulse: 68 (Regular) Resp.: 14 (Unlabored) BP: 138/80 (Sitting, Left Arm, Standard)     Physical Exam The physical exam findings are as follows:   General Mental Status - Alert, cooperative and good historian. General Appearance- pleasant. Not in acute distress. Orientation- Oriented X3. Build & Nutrition- Well nourished and Well developed.   Head and Neck Head- normocephalic, atraumatic . Neck Global Assessment- supple. no bruit auscultated on the right and no bruit auscultated on the left.   Eye Vision- Wears corrective lenses. Pupil- Bilateral- Regular and Round. Motion- Bilateral- EOMI.   Chest and Lung Exam Auscultation: Breath sounds:- clear at anterior chest wall and - clear at posterior chest wall. Adventitious sounds:- No Adventitious sounds.   Cardiovascular Auscultation:Rhythm- Regular rate and rhythm. Heart Sounds- S1 WNL and S2 WNL. Murmurs & Other Heart Sounds:Auscultation of the heart reveals - No Murmurs.   Abdomen Palpation/Percussion:Tenderness- Abdomen is non-tender to palpation. Rigidity (guarding)- Abdomen is soft. Auscultation:Auscultation of the abdomen reveals - Bowel sounds normal.   Female Genitourinary Not done, not pertinent to present illness  Musculoskeletal  Well developed female in no distress. Left knee no effusion. Slight varus deformity range 5 to 120. Moderate crepitus on range of motion. Tender medial greater than lateral with no instability noted. Her right knee no swelling. Range 0 to 125. No tenderness or instability.   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 Wynelle Link.  Plan is to go home.  PCP - Dr. Randel Pigg Cardiology - Dr. Stanford Breed  The patient will not receive TXA (tranexamic acid) due to: Breast Cancer  PLEASE AVOID THE RIGHT ARM DUE TO RIGHT-SIDED BREAST CANCER  Signed electronically by Joelene Millin, III PA-C

## 2013-02-10 ENCOUNTER — Inpatient Hospital Stay (HOSPITAL_COMMUNITY)
Admission: RE | Admit: 2013-02-10 | Discharge: 2013-02-13 | DRG: 470 | Disposition: A | Discharge status: Home Health | Payer: Medicare Other | Source: Ambulatory Visit | Attending: Orthopedic Surgery | Admitting: Orthopedic Surgery

## 2013-02-10 ENCOUNTER — Encounter (HOSPITAL_COMMUNITY): Payer: Medicare Other | Admitting: Registered Nurse

## 2013-02-10 ENCOUNTER — Encounter (HOSPITAL_COMMUNITY)
Admission: RE | Disposition: A | Discharge status: Home Health | Payer: Self-pay | Source: Ambulatory Visit | Attending: Orthopedic Surgery

## 2013-02-10 ENCOUNTER — Encounter (HOSPITAL_COMMUNITY): Payer: Self-pay

## 2013-02-10 ENCOUNTER — Inpatient Hospital Stay (HOSPITAL_COMMUNITY): Payer: Medicare Other | Admitting: Registered Nurse

## 2013-02-10 DIAGNOSIS — Z7982 Long term (current) use of aspirin: Secondary | ICD-10-CM

## 2013-02-10 DIAGNOSIS — G589 Mononeuropathy, unspecified: Secondary | ICD-10-CM | POA: Diagnosis present

## 2013-02-10 DIAGNOSIS — E7800 Pure hypercholesterolemia, unspecified: Secondary | ICD-10-CM | POA: Diagnosis present

## 2013-02-10 DIAGNOSIS — E785 Hyperlipidemia, unspecified: Secondary | ICD-10-CM | POA: Diagnosis present

## 2013-02-10 DIAGNOSIS — C50919 Malignant neoplasm of unspecified site of unspecified female breast: Secondary | ICD-10-CM | POA: Diagnosis not present

## 2013-02-10 DIAGNOSIS — Z96659 Presence of unspecified artificial knee joint: Secondary | ICD-10-CM

## 2013-02-10 DIAGNOSIS — Z87440 Personal history of urinary (tract) infections: Secondary | ICD-10-CM | POA: Diagnosis not present

## 2013-02-10 DIAGNOSIS — Z8249 Family history of ischemic heart disease and other diseases of the circulatory system: Secondary | ICD-10-CM | POA: Diagnosis not present

## 2013-02-10 DIAGNOSIS — Z9221 Personal history of antineoplastic chemotherapy: Secondary | ICD-10-CM | POA: Diagnosis not present

## 2013-02-10 DIAGNOSIS — Z85820 Personal history of malignant melanoma of skin: Secondary | ICD-10-CM | POA: Diagnosis not present

## 2013-02-10 DIAGNOSIS — F329 Major depressive disorder, single episode, unspecified: Secondary | ICD-10-CM | POA: Diagnosis present

## 2013-02-10 DIAGNOSIS — E669 Obesity, unspecified: Secondary | ICD-10-CM | POA: Diagnosis present

## 2013-02-10 DIAGNOSIS — Z79899 Other long term (current) drug therapy: Secondary | ICD-10-CM | POA: Diagnosis not present

## 2013-02-10 DIAGNOSIS — Z860100 Personal history of colon polyps, unspecified: Secondary | ICD-10-CM

## 2013-02-10 DIAGNOSIS — K219 Gastro-esophageal reflux disease without esophagitis: Secondary | ICD-10-CM | POA: Diagnosis present

## 2013-02-10 DIAGNOSIS — M1710 Unilateral primary osteoarthritis, unspecified knee: Principal | ICD-10-CM | POA: Diagnosis present

## 2013-02-10 DIAGNOSIS — D62 Acute posthemorrhagic anemia: Secondary | ICD-10-CM

## 2013-02-10 DIAGNOSIS — Z96651 Presence of right artificial knee joint: Secondary | ICD-10-CM

## 2013-02-10 DIAGNOSIS — Z853 Personal history of malignant neoplasm of breast: Secondary | ICD-10-CM | POA: Diagnosis not present

## 2013-02-10 DIAGNOSIS — G5600 Carpal tunnel syndrome, unspecified upper limb: Secondary | ICD-10-CM | POA: Diagnosis present

## 2013-02-10 DIAGNOSIS — I10 Essential (primary) hypertension: Secondary | ICD-10-CM | POA: Diagnosis present

## 2013-02-10 DIAGNOSIS — J45909 Unspecified asthma, uncomplicated: Secondary | ICD-10-CM | POA: Diagnosis present

## 2013-02-10 DIAGNOSIS — Z8262 Family history of osteoporosis: Secondary | ICD-10-CM | POA: Diagnosis not present

## 2013-02-10 DIAGNOSIS — Z6835 Body mass index (BMI) 35.0-35.9, adult: Secondary | ICD-10-CM | POA: Diagnosis not present

## 2013-02-10 DIAGNOSIS — IMO0002 Reserved for concepts with insufficient information to code with codable children: Secondary | ICD-10-CM | POA: Diagnosis not present

## 2013-02-10 DIAGNOSIS — M179 Osteoarthritis of knee, unspecified: Secondary | ICD-10-CM | POA: Diagnosis present

## 2013-02-10 DIAGNOSIS — R4182 Altered mental status, unspecified: Secondary | ICD-10-CM

## 2013-02-10 DIAGNOSIS — F3289 Other specified depressive episodes: Secondary | ICD-10-CM | POA: Diagnosis present

## 2013-02-10 DIAGNOSIS — I428 Other cardiomyopathies: Secondary | ICD-10-CM | POA: Diagnosis present

## 2013-02-10 HISTORY — PX: TOTAL KNEE ARTHROPLASTY: SHX125

## 2013-02-10 LAB — TYPE AND SCREEN
ABO/RH(D): O POS
ANTIBODY SCREEN: NEGATIVE

## 2013-02-10 SURGERY — ARTHROPLASTY, KNEE, TOTAL
Anesthesia: Spinal | Site: Knee | Laterality: Left

## 2013-02-10 MED ORDER — VENLAFAXINE HCL ER 150 MG PO CP24
150.0000 mg | ORAL_CAPSULE | Freq: Every day | ORAL | Status: DC
  Administered 2013-02-10 – 2013-02-12 (×3): 150 mg via ORAL
  Filled 2013-02-10 (×4): qty 1

## 2013-02-10 MED ORDER — LIDOCAINE HCL (CARDIAC) 20 MG/ML IV SOLN
INTRAVENOUS | Status: AC
Start: 2013-02-10 — End: 2013-02-10
  Filled 2013-02-10: qty 5

## 2013-02-10 MED ORDER — FLEET ENEMA 7-19 GM/118ML RE ENEM: 1.0000 | ENEMA | Freq: Once | RECTAL | Status: AC | PRN

## 2013-02-10 MED ORDER — SODIUM CHLORIDE 0.9 % IJ SOLN
INTRAMUSCULAR | Status: DC | PRN
  Administered 2013-02-10: 30 mL

## 2013-02-10 MED ORDER — BUPIVACAINE LIPOSOME 1.3 % IJ SUSP
INTRAMUSCULAR | Status: DC | PRN
  Administered 2013-02-10: 20 mL

## 2013-02-10 MED ORDER — ONDANSETRON HCL 4 MG/2ML IJ SOLN: 4.0000 mg | Freq: Four times a day (QID) | INTRAMUSCULAR | Status: DC | PRN

## 2013-02-10 MED ORDER — PHENOL 1.4 % MT LIQD
1.0000 | OROMUCOSAL | Status: DC | PRN
  Filled 2013-02-10: qty 177

## 2013-02-10 MED ORDER — PHENYLEPHRINE 40 MCG/ML (10ML) SYRINGE FOR IV PUSH (FOR BLOOD PRESSURE SUPPORT)
PREFILLED_SYRINGE | INTRAVENOUS | Status: AC
  Filled 2013-02-10: qty 10

## 2013-02-10 MED ORDER — HYDROXYZINE HCL 25 MG PO TABS
25.0000 mg | ORAL_TABLET | Freq: Every day | ORAL | Status: DC
  Administered 2013-02-10 – 2013-02-12 (×3): 25 mg via ORAL
  Filled 2013-02-10 (×4): qty 1

## 2013-02-10 MED ORDER — LACTATED RINGERS IV SOLN
INTRAVENOUS | Status: DC | PRN
  Administered 2013-02-10: 16:00:00 via INTRAVENOUS

## 2013-02-10 MED ORDER — FENTANYL CITRATE 0.05 MG/ML IJ SOLN
INTRAMUSCULAR | Status: AC
  Filled 2013-02-10: qty 2

## 2013-02-10 MED ORDER — SODIUM CHLORIDE 0.9 % IJ SOLN
INTRAMUSCULAR | Status: AC
  Filled 2013-02-10: qty 50

## 2013-02-10 MED ORDER — TRAMADOL HCL 50 MG PO TABS
50.0000 mg | ORAL_TABLET | Freq: Four times a day (QID) | ORAL | Status: DC | PRN
  Administered 2013-02-12 (×3): 50 mg via ORAL
  Administered 2013-02-13 (×2): 100 mg via ORAL
  Filled 2013-02-10 (×2): qty 2
  Filled 2013-02-10 (×3): qty 1

## 2013-02-10 MED ORDER — DEXAMETHASONE SODIUM PHOSPHATE 10 MG/ML IJ SOLN
INTRAMUSCULAR | Status: AC
  Filled 2013-02-10: qty 1

## 2013-02-10 MED ORDER — DOCUSATE SODIUM 100 MG PO CAPS
100.0000 mg | ORAL_CAPSULE | Freq: Two times a day (BID) | ORAL | Status: DC
  Administered 2013-02-10 – 2013-02-13 (×6): 100 mg via ORAL

## 2013-02-10 MED ORDER — BUPIVACAINE HCL (PF) 0.25 % IJ SOLN
INTRAMUSCULAR | Status: AC
  Filled 2013-02-10: qty 30

## 2013-02-10 MED ORDER — MORPHINE SULFATE 2 MG/ML IJ SOLN
1.0000 mg | INTRAMUSCULAR | Status: DC | PRN
  Administered 2013-02-11: 1 mg via INTRAVENOUS
  Administered 2013-02-11: 2 mg via INTRAVENOUS
  Filled 2013-02-10 (×2): qty 1

## 2013-02-10 MED ORDER — BUPIVACAINE IN DEXTROSE 0.75-8.25 % IT SOLN
INTRATHECAL | Status: DC | PRN
  Administered 2013-02-10: 1.5 mL via INTRATHECAL

## 2013-02-10 MED ORDER — ACETAMINOPHEN 500 MG PO TABS
1000.0000 mg | ORAL_TABLET | Freq: Once | ORAL | Status: AC
  Administered 2013-02-10: 1000 mg via ORAL
  Filled 2013-02-10: qty 2

## 2013-02-10 MED ORDER — PROPOFOL INFUSION 10 MG/ML OPTIME
INTRAVENOUS | Status: DC | PRN
  Administered 2013-02-10: 100 ug/kg/min via INTRAVENOUS

## 2013-02-10 MED ORDER — FAMOTIDINE 20 MG PO TABS
20.0000 mg | ORAL_TABLET | Freq: Two times a day (BID) | ORAL | Status: DC
  Administered 2013-02-10 – 2013-02-13 (×6): 20 mg via ORAL
  Filled 2013-02-10 (×7): qty 1

## 2013-02-10 MED ORDER — ONDANSETRON HCL 4 MG/2ML IJ SOLN
INTRAMUSCULAR | Status: DC | PRN
  Administered 2013-02-10: 4 mg via INTRAVENOUS

## 2013-02-10 MED ORDER — SODIUM CHLORIDE 0.9 % IV SOLN: INTRAVENOUS | Status: DC

## 2013-02-10 MED ORDER — MEPERIDINE HCL 50 MG/ML IJ SOLN: 6.2500 mg | INTRAMUSCULAR | Status: DC | PRN

## 2013-02-10 MED ORDER — SODIUM CHLORIDE 0.9 % IR SOLN
Status: DC | PRN
  Administered 2013-02-10: 1000 mL

## 2013-02-10 MED ORDER — CEFAZOLIN SODIUM-DEXTROSE 2-3 GM-% IV SOLR
2.0000 g | INTRAVENOUS | Status: AC
  Administered 2013-02-10: 2 g via INTRAVENOUS

## 2013-02-10 MED ORDER — MIDAZOLAM HCL 5 MG/5ML IJ SOLN
INTRAMUSCULAR | Status: DC | PRN
  Administered 2013-02-10: 2 mg via INTRAVENOUS

## 2013-02-10 MED ORDER — DEXAMETHASONE SODIUM PHOSPHATE 10 MG/ML IJ SOLN
10.0000 mg | Freq: Once | INTRAMUSCULAR | Status: AC
  Administered 2013-02-10: 10 mg via INTRAVENOUS

## 2013-02-10 MED ORDER — OXYCODONE HCL 5 MG PO TABS
5.0000 mg | ORAL_TABLET | ORAL | Status: DC | PRN
  Administered 2013-02-10 – 2013-02-11 (×6): 10 mg via ORAL
  Administered 2013-02-12: 5 mg via ORAL
  Filled 2013-02-10 (×8): qty 2

## 2013-02-10 MED ORDER — FENTANYL CITRATE 0.05 MG/ML IJ SOLN: 25.0000 ug | INTRAMUSCULAR | Status: DC | PRN

## 2013-02-10 MED ORDER — DEXTROSE-NACL 5-0.45 % IV SOLN
INTRAVENOUS | Status: DC
  Administered 2013-02-10: 1000 mL via INTRAVENOUS
  Administered 2013-02-11: 05:00:00 via INTRAVENOUS

## 2013-02-10 MED ORDER — BUPIVACAINE HCL 0.25 % IJ SOLN
INTRAMUSCULAR | Status: DC | PRN
  Administered 2013-02-10: 20 mL

## 2013-02-10 MED ORDER — FENTANYL CITRATE 0.05 MG/ML IJ SOLN
INTRAMUSCULAR | Status: DC | PRN
  Administered 2013-02-10: 50 ug via INTRAVENOUS

## 2013-02-10 MED ORDER — IRBESARTAN 300 MG PO TABS
300.0000 mg | ORAL_TABLET | Freq: Every day | ORAL | Status: DC
  Filled 2013-02-10: qty 1

## 2013-02-10 MED ORDER — PROPOFOL 10 MG/ML IV BOLUS
INTRAVENOUS | Status: AC
  Filled 2013-02-10: qty 20

## 2013-02-10 MED ORDER — DEXAMETHASONE SODIUM PHOSPHATE 10 MG/ML IJ SOLN
10.0000 mg | Freq: Every day | INTRAMUSCULAR | Status: AC
  Filled 2013-02-10: qty 1

## 2013-02-10 MED ORDER — ONDANSETRON HCL 4 MG PO TABS: 4.0000 mg | ORAL_TABLET | Freq: Four times a day (QID) | ORAL | Status: DC | PRN

## 2013-02-10 MED ORDER — SUCRALFATE 1 G PO TABS
1.0000 g | ORAL_TABLET | Freq: Every day | ORAL | Status: DC | PRN
  Filled 2013-02-10: qty 1

## 2013-02-10 MED ORDER — BISACODYL 10 MG RE SUPP: 10.0000 mg | Freq: Every day | RECTAL | Status: DC | PRN

## 2013-02-10 MED ORDER — RIVAROXABAN 10 MG PO TABS
10.0000 mg | ORAL_TABLET | Freq: Every day | ORAL | Status: DC
  Administered 2013-02-11 – 2013-02-13 (×3): 10 mg via ORAL
  Filled 2013-02-10 (×4): qty 1

## 2013-02-10 MED ORDER — CARVEDILOL 6.25 MG PO TABS
6.2500 mg | ORAL_TABLET | ORAL | Status: AC
  Administered 2013-02-10: 6.25 mg via ORAL
  Filled 2013-02-10: qty 1

## 2013-02-10 MED ORDER — PENTOSAN POLYSULFATE SODIUM 100 MG PO CAPS
200.0000 mg | ORAL_CAPSULE | ORAL | Status: AC
  Administered 2013-02-10: 200 mg via ORAL

## 2013-02-10 MED ORDER — ACETAMINOPHEN 650 MG RE SUPP: 650.0000 mg | Freq: Four times a day (QID) | RECTAL | Status: DC | PRN

## 2013-02-10 MED ORDER — BUPIVACAINE LIPOSOME 1.3 % IJ SUSP
20.0000 mL | Freq: Once | INTRAMUSCULAR | Status: DC
  Filled 2013-02-10: qty 20

## 2013-02-10 MED ORDER — GABAPENTIN 300 MG PO CAPS
300.0000 mg | ORAL_CAPSULE | Freq: Two times a day (BID) | ORAL | Status: DC
  Administered 2013-02-10 – 2013-02-13 (×6): 300 mg via ORAL
  Filled 2013-02-10 (×7): qty 1

## 2013-02-10 MED ORDER — DEXAMETHASONE 6 MG PO TABS
10.0000 mg | ORAL_TABLET | Freq: Every day | ORAL | Status: AC
  Administered 2013-02-11: 10 mg via ORAL
  Filled 2013-02-10: qty 1

## 2013-02-10 MED ORDER — CARVEDILOL 6.25 MG PO TABS
6.2500 mg | ORAL_TABLET | Freq: Two times a day (BID) | ORAL | Status: DC
  Administered 2013-02-11 – 2013-02-13 (×5): 6.25 mg via ORAL
  Filled 2013-02-10 (×8): qty 1

## 2013-02-10 MED ORDER — ACETAMINOPHEN 325 MG PO TABS
650.0000 mg | ORAL_TABLET | Freq: Four times a day (QID) | ORAL | Status: DC | PRN
  Administered 2013-02-12: 650 mg via ORAL
  Filled 2013-02-10: qty 2

## 2013-02-10 MED ORDER — ACETAMINOPHEN 500 MG PO TABS
1000.0000 mg | ORAL_TABLET | Freq: Four times a day (QID) | ORAL | Status: AC
  Administered 2013-02-10 – 2013-02-11 (×4): 1000 mg via ORAL
  Filled 2013-02-10 (×4): qty 2

## 2013-02-10 MED ORDER — ATORVASTATIN CALCIUM 40 MG PO TABS
40.0000 mg | ORAL_TABLET | Freq: Every day | ORAL | Status: DC
  Administered 2013-02-10 – 2013-02-12 (×3): 40 mg via ORAL
  Filled 2013-02-10 (×4): qty 1

## 2013-02-10 MED ORDER — 0.9 % SODIUM CHLORIDE (POUR BTL) OPTIME
TOPICAL | Status: DC | PRN
  Administered 2013-02-10: 1000 mL

## 2013-02-10 MED ORDER — MENTHOL 3 MG MT LOZG
1.0000 | LOZENGE | OROMUCOSAL | Status: DC | PRN
  Filled 2013-02-10: qty 9

## 2013-02-10 MED ORDER — LIDOCAINE HCL (CARDIAC) 20 MG/ML IV SOLN
INTRAVENOUS | Status: DC | PRN
  Administered 2013-02-10: 100 mg via INTRAVENOUS

## 2013-02-10 MED ORDER — CEFAZOLIN SODIUM-DEXTROSE 2-3 GM-% IV SOLR
2.0000 g | Freq: Four times a day (QID) | INTRAVENOUS | Status: AC
  Administered 2013-02-10 – 2013-02-11 (×2): 2 g via INTRAVENOUS
  Filled 2013-02-10 (×2): qty 50

## 2013-02-10 MED ORDER — STERILE WATER FOR IRRIGATION IR SOLN
Status: DC | PRN
  Administered 2013-02-10: 1500 mL

## 2013-02-10 MED ORDER — PHENYLEPHRINE HCL 10 MG/ML IJ SOLN
INTRAMUSCULAR | Status: DC | PRN
  Administered 2013-02-10 (×4): 40 ug via INTRAVENOUS

## 2013-02-10 MED ORDER — ONDANSETRON HCL 4 MG/2ML IJ SOLN
INTRAMUSCULAR | Status: AC
  Filled 2013-02-10: qty 2

## 2013-02-10 MED ORDER — MIDAZOLAM HCL 2 MG/2ML IJ SOLN
INTRAMUSCULAR | Status: AC
  Filled 2013-02-10: qty 2

## 2013-02-10 MED ORDER — PENTOSAN POLYSULFATE SODIUM 100 MG PO CAPS
200.0000 mg | ORAL_CAPSULE | Freq: Two times a day (BID) | ORAL | Status: DC
  Administered 2013-02-11 – 2013-02-13 (×4): 200 mg via ORAL
  Filled 2013-02-10 (×6): qty 2

## 2013-02-10 MED ORDER — METOCLOPRAMIDE HCL 10 MG PO TABS: 5.0000 mg | ORAL_TABLET | Freq: Three times a day (TID) | ORAL | Status: DC | PRN

## 2013-02-10 MED ORDER — METOCLOPRAMIDE HCL 5 MG/ML IJ SOLN: 5.0000 mg | Freq: Three times a day (TID) | INTRAMUSCULAR | Status: DC | PRN

## 2013-02-10 MED ORDER — CLONAZEPAM 0.5 MG PO TABS
0.5000 mg | ORAL_TABLET | Freq: Every day | ORAL | Status: DC
  Administered 2013-02-10 – 2013-02-12 (×3): 0.5 mg via ORAL
  Filled 2013-02-10 (×3): qty 1

## 2013-02-10 MED ORDER — IRBESARTAN 300 MG PO TABS
300.0000 mg | ORAL_TABLET | Freq: Every day | ORAL | Status: DC
  Administered 2013-02-12: 300 mg via ORAL
  Filled 2013-02-10 (×3): qty 1

## 2013-02-10 MED ORDER — DIPHENHYDRAMINE HCL 12.5 MG/5ML PO ELIX: 12.5000 mg | ORAL_SOLUTION | ORAL | Status: DC | PRN

## 2013-02-10 MED ORDER — METHOCARBAMOL 500 MG PO TABS
500.0000 mg | ORAL_TABLET | Freq: Four times a day (QID) | ORAL | Status: DC | PRN
  Administered 2013-02-11 – 2013-02-12 (×3): 500 mg via ORAL
  Filled 2013-02-10 (×3): qty 1

## 2013-02-10 MED ORDER — KETOROLAC TROMETHAMINE 15 MG/ML IJ SOLN: 7.5000 mg | Freq: Four times a day (QID) | INTRAMUSCULAR | Status: AC | PRN

## 2013-02-10 MED ORDER — DEXTROSE 5 % IV SOLN
500.0000 mg | Freq: Four times a day (QID) | INTRAVENOUS | Status: DC | PRN
  Administered 2013-02-11: 500 mg via INTRAVENOUS
  Filled 2013-02-10: qty 5

## 2013-02-10 MED ORDER — CEFAZOLIN SODIUM-DEXTROSE 2-3 GM-% IV SOLR
INTRAVENOUS | Status: AC
  Filled 2013-02-10: qty 50

## 2013-02-10 MED ORDER — POLYETHYLENE GLYCOL 3350 17 G PO PACK
17.0000 g | PACK | Freq: Every day | ORAL | Status: DC | PRN
  Administered 2013-02-11 – 2013-02-12 (×2): 17 g via ORAL

## 2013-02-10 SURGICAL SUPPLY — 55 items
BAG SPEC THK2 15X12 ZIP CLS (MISCELLANEOUS) ×1
BAG ZIPLOCK 12X15 (MISCELLANEOUS) ×2 IMPLANT
BANDAGE ELASTIC 6 VELCRO ST LF (GAUZE/BANDAGES/DRESSINGS) ×2 IMPLANT
BANDAGE ESMARK 6X9 LF (GAUZE/BANDAGES/DRESSINGS) ×1 IMPLANT
BLADE SAG 18X100X1.27 (BLADE) ×2 IMPLANT
BLADE SAW SGTL 11.0X1.19X90.0M (BLADE) ×2 IMPLANT
BNDG CMPR 9X6 STRL LF SNTH (GAUZE/BANDAGES/DRESSINGS) ×1
BNDG ESMARK 6X9 LF (GAUZE/BANDAGES/DRESSINGS) ×2
BOWL SMART MIX CTS (DISPOSABLE) ×2 IMPLANT
CAPT RP KNEE ×1 IMPLANT
CEMENT HV SMART SET (Cement) ×4 IMPLANT
CUFF TOURN SGL QUICK 34 (TOURNIQUET CUFF) ×2
CUFF TRNQT CYL 34X4X40X1 (TOURNIQUET CUFF) ×1 IMPLANT
DECANTER SPIKE VIAL GLASS SM (MISCELLANEOUS) ×2 IMPLANT
DRAPE EXTREMITY T 121X128X90 (DRAPE) ×2 IMPLANT
DRAPE POUCH INSTRU U-SHP 10X18 (DRAPES) ×2 IMPLANT
DRAPE U-SHAPE 47X51 STRL (DRAPES) ×2 IMPLANT
DRSG ADAPTIC 3X8 NADH LF (GAUZE/BANDAGES/DRESSINGS) ×2 IMPLANT
DURAPREP 26ML APPLICATOR (WOUND CARE) ×2 IMPLANT
ELECT REM PT RETURN 9FT ADLT (ELECTROSURGICAL) ×2
ELECTRODE REM PT RTRN 9FT ADLT (ELECTROSURGICAL) ×1 IMPLANT
EVACUATOR 1/8 PVC DRAIN (DRAIN) ×2 IMPLANT
FACESHIELD LNG OPTICON STERILE (SAFETY) ×10 IMPLANT
GLOVE BIO SURGEON STRL SZ7.5 (GLOVE) IMPLANT
GLOVE BIO SURGEON STRL SZ8 (GLOVE) ×2 IMPLANT
GLOVE BIOGEL PI IND STRL 8 (GLOVE) ×2 IMPLANT
GLOVE BIOGEL PI INDICATOR 8 (GLOVE) ×2
GLOVE SURG SS PI 6.5 STRL IVOR (GLOVE) IMPLANT
GOWN STRL REUS W/TWL LRG LVL3 (GOWN DISPOSABLE) ×2 IMPLANT
GOWN STRL REUS W/TWL XL LVL3 (GOWN DISPOSABLE) IMPLANT
HANDPIECE INTERPULSE COAX TIP (DISPOSABLE) ×2
IMMOBILIZER KNEE 20 (SOFTGOODS) ×2 IMPLANT
KIT BASIN OR (CUSTOM PROCEDURE TRAY) ×2 IMPLANT
MANIFOLD NEPTUNE II (INSTRUMENTS) ×2 IMPLANT
NDL SAFETY ECLIPSE 18X1.5 (NEEDLE) ×2 IMPLANT
NEEDLE HYPO 18GX1.5 SHARP (NEEDLE) ×4
NS IRRIG 1000ML POUR BTL (IV SOLUTION) ×2 IMPLANT
PACK TOTAL JOINT (CUSTOM PROCEDURE TRAY) ×2 IMPLANT
PAD ABD 8X10 STRL (GAUZE/BANDAGES/DRESSINGS) ×2 IMPLANT
PADDING CAST COTTON 6X4 STRL (CAST SUPPLIES) ×5 IMPLANT
POSITIONER SURGICAL ARM (MISCELLANEOUS) ×2 IMPLANT
SET HNDPC FAN SPRY TIP SCT (DISPOSABLE) ×1 IMPLANT
SPONGE GAUZE 4X4 12PLY (GAUZE/BANDAGES/DRESSINGS) ×2 IMPLANT
STRIP CLOSURE SKIN 1/2X4 (GAUZE/BANDAGES/DRESSINGS) ×4 IMPLANT
SUCTION FRAZIER 12FR DISP (SUCTIONS) ×2 IMPLANT
SUT MNCRL AB 4-0 PS2 18 (SUTURE) ×2 IMPLANT
SUT VIC AB 2-0 CT1 27 (SUTURE) ×6
SUT VIC AB 2-0 CT1 TAPERPNT 27 (SUTURE) ×3 IMPLANT
SUT VLOC 180 0 24IN GS25 (SUTURE) ×2 IMPLANT
SYR 20CC LL (SYRINGE) ×2 IMPLANT
SYR 50ML LL SCALE MARK (SYRINGE) ×2 IMPLANT
TOWEL OR 17X26 10 PK STRL BLUE (TOWEL DISPOSABLE) ×4 IMPLANT
TRAY FOLEY CATH 14FRSI W/METER (CATHETERS) ×2 IMPLANT
WATER STERILE IRR 1500ML POUR (IV SOLUTION) ×2 IMPLANT
WRAP KNEE MAXI GEL POST OP (GAUZE/BANDAGES/DRESSINGS) ×2 IMPLANT

## 2013-02-10 NOTE — Anesthesia Preprocedure Evaluation (Addendum)
Anesthesia Evaluation  Patient identified by MRN, date of birth, ID band Patient awake    Reviewed: Allergy & Precautions, H&P , NPO status , Patient's Chart, lab work & pertinent test results  Airway Mallampati: II TM Distance: >3 FB Neck ROM: Full    Dental no notable dental hx.    Pulmonary asthma ,  breath sounds clear to auscultation  Pulmonary exam normal       Cardiovascular hypertension, Pt. on medications Rhythm:Regular Rate:Normal     Neuro/Psych negative neurological ROS  negative psych ROS   GI/Hepatic Neg liver ROS, GERD-  Medicated,  Endo/Other  negative endocrine ROSMorbid obesity  Renal/GU negative Renal ROS  negative genitourinary   Musculoskeletal negative musculoskeletal ROS (+)   Abdominal   Peds negative pediatric ROS (+)  Hematology negative hematology ROS (+)   Anesthesia Other Findings   Reproductive/Obstetrics negative OB ROS                         Anesthesia Physical  Anesthesia Plan  ASA: III  Anesthesia Plan: Spinal   Post-op Pain Management:    Induction:   Airway Management Planned: Simple Face Mask  Additional Equipment:   Intra-op Plan:   Post-operative Plan:   Informed Consent: I have reviewed the patients History and Physical, chart, labs and discussed the procedure including the risks, benefits and alternatives for the proposed anesthesia with the patient or authorized representative who has indicated his/her understanding and acceptance.   Dental advisory given  Plan Discussed with: CRNA and Surgeon  Anesthesia Plan Comments:         Anesthesia Quick Evaluation

## 2013-02-10 NOTE — H&P (View-Only) (Signed)
Shannon [name]  DOB: 04/11/1945 Married / Language: English / Race: White Female  Date of Admission:  02-10-2013  Chief Complaint:  Left Knee Pain  History of Present Illness The patient is a 68 year old female who comes in for a preoperative History and Physical. The patient is scheduled for a left total knee arthroplasty to be performed by Dr. Dione Plover. Aluisio, MD at [facility] on 02-17-2013. The patient is a 68 year old female who presents for follow up of their knee. The patient is being followed for their left knee pain and osteoarthritis. They are now weeks out from synvisc injections. Symptoms reported today include: pain. The patient feels that they are doing poorly. Note for "Follow-up Knee": Patient states that she got some relief from the injections but it didn't last long at all. The patient comes in today several months out from right total knee arthroplasty. The patient states that she is doing very well at this time. control at this time and describe their pain as mild. They are currently on Tylenol for their pain. The patient is currently doing home exercise program (bike). The patient feels that they are progressing well at this time. Note for "Post TKA": Patient states that she is still having pain at night. She said the left knee is getting progressively worse. The Synvisc really did not help much. The knee feels like it shifts on her at times. It feels like it wants to give out. She has documented bone on bone disease. She has had cortisone and Synvisc which have not been helpful. She is ready to go ahead and get the left knee replaced. They have been treated conservatively in the past for the above stated problem and despite conservative measures, they continue to have progressive pain and severe functional limitations and dysfunction. They have failed non-operative management including home exercise, medications, and injections. It is felt that they would benefit  from undergoing total joint replacement. Risks and benefits of the procedure have been discussed with the patient and they elect to proceed with surgery. There are no active contraindications to surgery such as ongoing infection or rapidly progressive neurological disease.  Allergies No Known Drug Allergies. 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 Melanoma Breast Cancer. Right Sided High blood pressure Migraine Headache Osteoporosis Osteoarthritis Peripheral Neuropathy Gastroesophageal Reflux Disease Asthma Hypercholesterolemia   Family History Hypertension. First Degree Relatives. mother Cancer. mother Osteoarthritis. father Osteoporosis. mother Congestive Heart Failure. father Heart Disease. 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 Plan is for home. Children. 3 Alcohol use. current drinker; only occasionally per week Illicit drug use. no Copy of Drug/Alcohol Rehab (Previously). no Pain Contract. no Living situation. live with spouse Marital status. married Current work status. working part time Tobacco / smoke exposure. no Exercise. Exercises rarely Number of flights of stairs before winded. 2-3 Tobacco use. Never smoker. never smoker Drug/Alcohol Rehab (Currently). no   Medication History Micardis ( Oral) Specific dose unknown - Active. Ferrous Fumarate (324MG  Tablet, Oral) Active. Elmiron (100MG  Capsule, Oral) Active. ClonazePAM (1MG  Tablet, Oral)  Active. Gabapentin ( Oral) Specific dose unknown - Active. Crestor ( Oral) Specific  dose unknown - Active. Carvedilol ( Oral) Specific dose unknown - Active. HydrOXYzine HCl ( Oral) Specific dose unknown - Active. Venlafaxine HCl ( Oral) Specific dose unknown - Active. Folic Acid (1MG Tablet, Oral) Active. Aspirin Childrens (81MG Tablet Chewable, Oral) Active. Multivitamin ( Oral) Active. Vitamin D ( Oral) Specific dose unknown - Active. Altelvia Active.   Past Surgical History Excision of Pituitary Mass. Date: 2000. Breast Mass; Local Excision. Date: 2011. right Tubal Ligation. Date: 1977. Tonsillectomy Carpal Tunnel Repair. left Cataract Surgery. right Gallbladder Surgery. laporoscopic Colon Polyp Removal - Colonoscopy Total Knee Replacement - Right. Date: 2014.   Review of Systems General:Not Present- Chills, Fever, Night Sweats, Fatigue, Weight Gain, Weight Loss and Memory Loss. Skin:Not Present- Hives, Itching, Rash, Eczema and Lesions. HEENT:Not Present- Tinnitus, Headache, Double Vision, Visual Loss, Hearing Loss and Dentures. Respiratory:Not Present- Shortness of breath with exertion, Shortness of breath at rest, Allergies, Coughing up blood and Chronic Cough. Cardiovascular:Not Present- Chest Pain, Racing/skipping heartbeats, Difficulty Breathing Lying Down, Murmur, Swelling and Palpitations. Gastrointestinal:Not Present- Bloody Stool, Heartburn, Abdominal Pain, Vomiting, Nausea, Constipation, Diarrhea, Difficulty Swallowing, Jaundice and Loss of appetitie. Female Genitourinary:Present- Urinary frequency. Not Present- Blood in Urine, Weak urinary stream, Discharge, Flank Pain, Incontinence, Painful Urination, Urgency, Urinary Retention and Urinating at Night. Musculoskeletal:Present- Joint Pain, Back Pain and Morning Stiffness. Not Present- Muscle Weakness, Muscle Pain, Joint Swelling and Spasms. Neurological:Not Present- Tremor, Dizziness, Blackout  spells, Paralysis, Difficulty with balance and Weakness. Psychiatric:Not Present- Insomnia.    Vitals Weight: 199 lb Height: 63 in Weight was reported by patient. Height was reported by patient. Body Surface Area: 2 m Body Mass Index: 35.25 kg/m Pulse: 68 (Regular) Resp.: 14 (Unlabored) BP: 138/80 (Sitting, Left Arm, Standard)     Physical Exam The physical exam findings are as follows:   General Mental Status - Alert, cooperative and good historian. General Appearance- pleasant. Not in acute distress. Orientation- Oriented X3. Build & Nutrition- Well nourished and Well developed.   Head and Neck Head- normocephalic, atraumatic . Neck Global Assessment- supple. no bruit auscultated on the right and no bruit auscultated on the left.   Eye Vision- Wears corrective lenses. Pupil- Bilateral- Regular and Round. Motion- Bilateral- EOMI.   Chest and Lung Exam Auscultation: Breath sounds:- clear at anterior chest wall and - clear at posterior chest wall. Adventitious sounds:- No Adventitious sounds.   Cardiovascular Auscultation:Rhythm- Regular rate and rhythm. Heart Sounds- S1 WNL and S2 WNL. Murmurs & Other Heart Sounds:Auscultation of the heart reveals - No Murmurs.   Abdomen Palpation/Percussion:Tenderness- Abdomen is non-tender to palpation. Rigidity (guarding)- Abdomen is soft. Auscultation:Auscultation of the abdomen reveals - Bowel sounds normal.   Female Genitourinary Not done, not pertinent to present illness  Musculoskeletal  Well developed female in no distress. Left knee no effusion. Slight varus deformity range 5 to 120. Moderate crepitus on range of motion. Tender medial greater than lateral with no instability noted. Her right knee no swelling. Range 0 to 125. No tenderness or instability.   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 Aluisio.  Plan is to go home.  PCP - Dr. Blythe Cardiology - Dr. Crenshaw  The patient will not receive TXA (tranexamic acid) due to: Breast Cancer  PLEASE AVOID THE RIGHT ARM DUE TO RIGHT-SIDED BREAST CANCER  Signed electronically by Muslima Toppins L Bruk Tumolo, III PA-C  

## 2013-02-10 NOTE — Anesthesia Procedure Notes (Signed)
Spinal  Patient location during procedure: OR Staffing Anesthesiologist: Arnoldo Hildreth Performed by: anesthesiologist  Preanesthetic Checklist Completed: patient identified, site marked, surgical consent, pre-op evaluation, timeout performed, IV checked, risks and benefits discussed and monitors and equipment checked Spinal Block Patient position: sitting Prep: Betadine Patient monitoring: heart rate, continuous pulse ox and blood pressure Approach: right paramedian Location: L3-4 Injection technique: single-shot Needle Needle type: Spinocan  Needle gauge: 22 G Needle length: 9 cm Additional Notes Expiration date of kit checked and confirmed. Patient tolerated procedure well, without complications.     

## 2013-02-10 NOTE — Transfer of Care (Signed)
Immediate Anesthesia Transfer of Care Note  Patient: Shannon [name]  Procedure(s) Performed: Procedure(s): LEFT TOTAL KNEE ARTHROPLASTY (Left)  Patient Location: PACU  Anesthesia Type:MAC and Spinal  Level of Consciousness: awake, alert , oriented and patient cooperative  Airway & Oxygen Therapy: Patient Spontanous Breathing and Patient connected to face mask oxygen  Post-op Assessment: Report given to PACU RN and Post -op Vital signs reviewed and stable  Post vital signs: Reviewed and stable  Complications: No apparent anesthesia complications

## 2013-02-10 NOTE — Op Note (Signed)
Pre-operative diagnosis- Osteoarthritis  Left knee(s)  Post-operative diagnosis- Osteoarthritis Left knee(s)  Procedure-  Left  Total Knee Arthroplasty  Surgeon- Dione Plover. Bilan Tedesco, MD  Assistant- Arlee Muslim, PA-C   Anesthesia-  Spinal EBL-* No blood loss amount entered *  Drains Hemovac  Tourniquet time-  Total Tourniquet Time Documented: Thigh (Left) - 30 minutes Total: Thigh (Left) - 30 minutes    Complications- None  Condition-PACU - hemodynamically stable.   Brief Clinical Note  Shannon [name] is a 68 y.o. year old female with end stage OA of her left knee with progressively worsening pain and dysfunction. She has constant pain, with activity and at rest and significant functional deficits with difficulties even with ADLs. She has had extensive non-op management including analgesics, injections of cortisone and viscosupplements, and home exercise program, but remains in significant pain with significant dysfunction. Radiographs show bone on bone arthritis patellofemoral with significant bony erosion of the patella. She presents now for left Total Knee Arthroplasty.    Procedure in detail---   The patient is brought into the operating room and positioned supine on the operating table. After successful administration of  Spinal,   a tourniquet is placed high on the  Left thigh(s) and the lower extremity is prepped and draped in the usual sterile fashion. Time out is performed by the operating team and then the  Left lower extremity is wrapped in Esmarch, knee flexed and the tourniquet inflated to 300 mmHg.       A midline incision is made with a ten blade through the subcutaneous tissue to the level of the extensor mechanism. A fresh blade is used to make a medial parapatellar arthrotomy. Soft tissue over the proximal medial tibia is subperiosteally elevated to the joint line with a knife and into the semimembranosus bursa with a Cobb elevator. Soft tissue over the proximal lateral  tibia is elevated with attention being paid to avoiding the patellar tendon on the tibial tubercle. The patella is everted, knee flexed 90 degrees and the ACL and PCL are removed. Findings are bone on bone patellofemoral with bony erosion patella and exposd bone medial femoral condyle.        The drill is used to create a starting hole in the distal femur and the canal is thoroughly irrigated with sterile saline to remove the fatty contents. The 5 degree Left  valgus alignment guide is placed into the femoral canal and the distal femoral cutting block is pinned to remove 10 mm off the distal femur. Resection is made with an oscillating saw.      The tibia is subluxed forward and the menisci are removed. The extramedullary alignment guide is placed referencing proximally at the medial aspect of the tibial tubercle and distally along the second metatarsal axis and tibial crest. The block is pinned to remove 32mm off the more deficient medial  side. Resection is made with an oscillating saw. Size 2.5is the most appropriate size for the tibia and the proximal tibia is prepared with the modular drill and keel punch for that size.      The femoral sizing guide is placed and size 3 is most appropriate. Rotation is marked off the epicondylar axis and confirmed by creating a rectangular flexion gap at 90 degrees. The size 3 cutting block is pinned in this rotation and the anterior, posterior and chamfer cuts are made with the oscillating saw. The intercondylar block is then placed and that cut is made.  Trial size 2.5 tibial component, trial size 3 posterior stabilized femur and a 10  mm posterior stabilized rotating platform insert trial is placed. Full extension is achieved with excellent varus/valgus and anterior/posterior balance throughout full range of motion. The patella is everted and thickness measured to be 22  mm. Free hand resection is taken to 12 mm, a 35 template is placed, lug holes are drilled, trial  patella is placed, and it tracks normally. Osteophytes are removed off the posterior femur with the trial in place. All trials are removed and the cut bone surfaces prepared with pulsatile lavage. Cement is mixed and once ready for implantation, the size 2.5 tibial implant, size  3 posterior stabilized femoral component, and the size 35 patella are cemented in place and the patella is held with the clamp. The trial insert is placed and the knee held in full extension. The Exparel (20 ml mixed with 30 ml saline) and .25% Bupivicaine, are injected into the extensor mechanism, posterior capsule, medial and lateral gutters and subcutaneous tissues.  All extruded cement is removed and once the cement is hard the permanent 10 mm posterior stabilized rotating platform insert is placed into the tibial tray.      The wound is copiously irrigated with saline solution and the extensor mechanism closed over a hemovac drain with #1 PDS suture. The tourniquet is released for a total tourniquet time of 30  minutes. Flexion against gravity is 140 degrees and the patella tracks normally. Subcutaneous tissue is closed with 2.0 vicryl and subcuticular with running 4.0 Monocryl. The incision is cleaned and dried and steri-strips and a bulky sterile dressing are applied. The limb is placed into a knee immobilizer and the patient is awakened and transported to recovery in stable condition.      Please note that a surgical assistant was a medical necessity for this procedure in order to perform it in a safe and expeditious manner. Surgical assistant was necessary to retract the ligaments and vital neurovascular structures to prevent injury to them and also necessary for proper positioning of the limb to allow for anatomic placement of the prosthesis.   [name], MD    02/10/2013, 5:08 PM

## 2013-02-10 NOTE — Anesthesia Postprocedure Evaluation (Signed)
  Anesthesia Post-op Note  Patient: Shannon [name]  Procedure(s) Performed: Procedure(s) (LRB): LEFT TOTAL KNEE ARTHROPLASTY (Left)  Patient Location: PACU  Anesthesia Type: Spinal  Level of Consciousness: awake and alert   Airway and Oxygen Therapy: Patient Spontanous Breathing  Post-op Pain: mild  Post-op Assessment: Post-op Vital signs reviewed, Patient's Cardiovascular Status Stable, Respiratory Function Stable, Patent Airway and No signs of Nausea or vomiting  Last Vitals:  Filed Vitals:   02/10/13 1844  BP: 114/79  Pulse: 69  Temp: 36.6 C  Resp: 16    Post-op Vital Signs: stable   Complications: No apparent anesthesia complications

## 2013-02-10 NOTE — Interval H&P Note (Signed)
History and Physical Interval Note:  02/10/2013 3:17 PM  Shannon [name]  has presented today for surgery, with the diagnosis of OA LEFT KNEE   The various methods of treatment have been discussed with the patient and family. After consideration of risks, benefits and other options for treatment, the patient has consented to  Procedure(s): LEFT TOTAL KNEE ARTHROPLASTY (Left) as a surgical intervention .  The patient's history has been reviewed, patient examined, no change in status, stable for surgery.  I have reviewed the patient's chart and labs.  Questions were answered to the patient's satisfaction.     Gearlean Alf

## 2013-02-10 NOTE — Preoperative (Signed)
Beta Blockers   Reason not to administer Beta Blockers:coreg taken 0845 02-10-13

## 2013-02-11 ENCOUNTER — Encounter (HOSPITAL_COMMUNITY): Payer: Self-pay | Admitting: Orthopedic Surgery

## 2013-02-11 LAB — BASIC METABOLIC PANEL
BUN: 12 mg/dL (ref 6–23)
CHLORIDE: 101 meq/L (ref 96–112)
CO2: 24 meq/L (ref 19–32)
CREATININE: 0.74 mg/dL (ref 0.50–1.10)
Calcium: 8.4 mg/dL (ref 8.4–10.5)
GFR calc non Af Amer: 85 mL/min — ABNORMAL LOW (ref >90)
Glucose, Bld: 159 mg/dL — ABNORMAL HIGH (ref 70–99)
POTASSIUM: 4.6 meq/L (ref 3.7–5.3)
SODIUM: 138 meq/L (ref 137–147)

## 2013-02-11 LAB — CBC
HCT: 31.7 % — ABNORMAL LOW (ref 36.0–46.0)
Hemoglobin: 10.1 g/dL — ABNORMAL LOW (ref 12.0–15.0)
MCH: 28.6 pg (ref 26.0–34.0)
MCHC: 31.9 g/dL (ref 30.0–36.0)
MCV: 89.8 fL (ref 78.0–100.0)
Platelets: 203 10*3/uL (ref 150–400)
RBC: 3.53 MIL/uL — AB (ref 3.87–5.11)
RDW: 13.3 % (ref 11.5–15.5)
WBC: 11.9 10*3/uL — ABNORMAL HIGH (ref 4.0–10.5)

## 2013-02-11 NOTE — Progress Notes (Signed)
Physical Therapy Treatment Patient Details Name: HORTENCE CHARTER MRN: [id number] DOB: [birth year] Today's Date: 02/11/2013 Time: 1050-1107 PT Time Calculation (min): 17 min  PT Assessment / Plan / Recommendation  History of Present Illness LTKA   PT Comments   Pt tolerating  Exercises well.  Follow Up Recommendations  Home health PT     Does the patient have the potential to tolerate intense rehabilitation     Barriers to Discharge        Equipment Recommendations  None recommended by PT    Recommendations for Other Services    Frequency     Progress towards PT Goals Progress towards PT goals: Progressing toward goals  Plan Current plan remains appropriate    Precautions / Restrictions Precautions Precautions: Knee Required Braces or Orthoses: Knee Immobilizer - Left Knee Immobilizer - Left: Discontinue once straight leg raise with < 10 degree lag Restrictions Weight Bearing Restrictions: No   Pertinent Vitals/Pain 5 L knee    Mobility    Exercises Total Joint Exercises Quad Sets: AROM;Both;10 reps;Supine Heel Slides: AAROM;10 reps;Supine;Left Hip ABduction/ADduction: AAROM;Left;10 reps;Supine Straight Leg Raises: AAROM;Left;10 reps;Supine Goniometric ROM: 10-50 l knee   PT Diagnosis:    PT Problem List:   PT Treatment Interventions:     PT Goals (current goals can now be found in the care plan section)    Visit Information  Last PT Received On: 02/11/13 Assistance Needed: +1 History of Present Illness: LTKA    Subjective Data      Cognition  Cognition Arousal/Alertness: Awake/alert Behavior During Therapy: WFL for tasks assessed/performed Overall Cognitive Status: Within Functional Limits for tasks assessed    Balance     End of Session PT - End of Session Activity Tolerance: Patient tolerated treatment well Patient left: in bed;with family/visitor present   GP     [name] 02/11/2013, 3:04 PM

## 2013-02-11 NOTE — Evaluation (Signed)
Physical Therapy Evaluation Patient Details Name: Shannon [name] MRN: [id number] DOB: March 11, 1945 Today's Date: 02/11/2013 Time: 7902-4097 PT Time Calculation (min): 11 min  PT Assessment / Plan / Recommendation History of Present Illness  LTKA  Clinical Impression  Pt tolerated ambulation today. Plans DC home. Pt will need RW. Pt will benefit from PT to address problems.    PT Assessment  Patient needs continued PT services    Follow Up Recommendations  Home health PT    Does the patient have the potential to tolerate intense rehabilitation      Barriers to Discharge        Equipment Recommendations  Rolling walker with 5" wheels;3in1 (PT)    Recommendations for Other Services     Frequency 7X/week    Precautions / Restrictions Precautions Precautions: Knee Required Braces or Orthoses: Knee Immobilizer - Left Knee Immobilizer - Left: Discontinue once straight leg raise with < 10 degree lag   Pertinent Vitals/Pain L knee, RN aware.       Mobility  Bed Mobility Overal bed mobility: Needs Assistance Bed Mobility: Supine to Sit Supine to sit: Min assist General bed mobility comments: support LLE Transfers Overall transfer level: Needs assistance Equipment used: Rolling walker (2 wheeled) Transfers: Sit to/from Stand Sit to Stand: Min assist General transfer comment: from bed, cues for UE palcement, LLE position Ambulation/Gait Ambulation/Gait assistance: Min assist Ambulation Distance (Feet): 50 Feet Assistive device: Rolling walker (2 wheeled) Gait Pattern/deviations: Step-to pattern;Step-through pattern;Antalgic General Gait Details: cues for sequence.    Exercises     PT Diagnosis: Difficulty walking;Acute pain  PT Problem List: Decreased strength;Decreased range of motion;Decreased activity tolerance;Decreased mobility;Decreased knowledge of precautions;Decreased safety awareness;Decreased knowledge of use of DME PT Treatment Interventions: DME  instruction;Gait training;Stair training;Functional mobility training;Therapeutic activities;Therapeutic exercise;Patient/family education     PT Goals(Current goals can be found in the care plan section) Acute Rehab PT Goals Patient Stated Goal: to walk  PT Goal Formulation: With patient Time For Goal Achievement: 02/15/13 Potential to Achieve Goals: Good  Visit Information  Last PT Received On: 02/11/13 Assistance Needed: +1 History of Present Illness: LTKA       Prior [place] expects to be discharged to:: Private residence Type of Home: House Entrance [place] of Steps: 4 Entrance Stairs-Rails: Right Home Layout: Multi-level;Able to live on main level with bedroom/bathroom Home Equipment: None Additional Comments: gave equipment away Prior Function Level of Independence: Independent with assistive device(s) Communication Communication: No difficulties    Cognition  Cognition Arousal/Alertness: Awake/alert Behavior During Therapy: WFL for tasks assessed/performed Overall Cognitive Status: Within Functional Limits for tasks assessed    Extremity/Trunk Assessment Upper Extremity Assessment Upper Extremity Assessment: Overall WFL for tasks assessed Lower Extremity Assessment Lower Extremity Assessment: LLE deficits/detail LLE Deficits / Details: able to lift leg from bed w/ KI   Balance    End of Session PT - End of Session Equipment Utilized During Treatment: Left knee immobilizer Activity Tolerance: Patient tolerated treatment well Patient left: in chair  GP     [name] 02/11/2013, 9:52 AM Tresa Endo PT [phone]

## 2013-02-11 NOTE — Evaluation (Addendum)
Occupational Therapy Evaluation Patient Details Name: Shannon [name] MRN: [id number] DOB: 06/05/1945 Today's Date: 02/11/2013 Time: 7846-9629 OT Time Calculation (min): 30 min  OT Assessment / Plan / Recommendation History of present illness LTKA   Clinical Impression   Pt was admitted for L TKA.  All education was completed. Pt does not need any further OT at this time.      OT Assessment  Patient does not need any further OT services    Follow Up Recommendations  No OT follow up    Barriers to Discharge      Equipment Recommendations  Pt believes she can borrow a 3:1 commode   Recommendations for Other Services    Frequency       Precautions / Restrictions Precautions Precautions: Knee Required Braces or Orthoses: Knee Immobilizer - Left Knee Immobilizer - Left: Discontinue once straight leg raise with < 10 degree lag Restrictions Weight Bearing Restrictions: No   Pertinent Vitals/Pain 5/10 LLE.  Repositioned and ice applied    ADL  Grooming: Supervision/safety Where Assessed - Grooming: Supported standing Upper Body Bathing: Set up Where Assessed - Upper Body Bathing: Unsupported sitting Lower Body Bathing: Minimal assistance Where Assessed - Lower Body Bathing: Supported sit to stand Upper Body Dressing: Minimal assistance (iv) Where Assessed - Upper Body Dressing: Unsupported sitting Lower Body Dressing: Moderate assistance Where Assessed - Lower Body Dressing: Supported sit to Lobbyist: Minimal assistance Armed forces technical officer Method: Sit to Loss adjuster, chartered: Raised toilet seat with arms (or 3-in-1 over toilet) Toileting - Clothing Manipulation and Hygiene: Minimal assistance Where Assessed - Toileting Clothing Manipulation and Hygiene: Sit to stand from 3-in-1 or toilet Equipment Used: Reacher;Rolling walker Transfers/Ambulation Related to ADLs: ambulated to bathroom and performed adl from 3:1 commode.  Min A for ambulation ADL  Comments: son and husband will assist with adls. but pt wants to get herself a reacher  Reviewed shower sequence.  Pt verbalizes and does not feel she needs to practice.    OT Diagnosis:    OT Problem List:   OT Treatment Interventions:     OT Goals(Current goals can be found in the care plan section) Acute Rehab OT Goals Patient Stated Goal: to walk   Visit Information  Last OT Received On: 02/11/13 Assistance Needed: +1 History of Present Illness: LTKA       Prior Weeki [place] expects to be discharged to:: Private residence Type of Home: House Entrance [place] of Steps: 4 Entrance Stairs-Rails: Right Home Layout: Multi-level;Able to live on main level with bedroom/bathroom Home Equipment: None Additional Comments: gave equipment away Prior Function Level of Independence: Independent with assistive device(s) Communication Communication: No difficulties         Vision/Perception     Cognition  Cognition Arousal/Alertness: Awake/alert Behavior During Therapy: WFL for tasks assessed/performed Overall Cognitive Status: Within Functional Limits for tasks assessed    Extremity/Trunk Assessment Upper Extremity Assessment Upper Extremity Assessment: Overall WFL for tasks assessed Lower Extremity Assessment Lower Extremity Assessment: LLE deficits/detail LLE Deficits / Details: able to lift leg from bed w/ KI     Mobility  Transfers Overall transfer level: Needs assistance Equipment used: Rolling walker (2 wheeled) Transfers: Sit to/from Stand Sit to Stand: Min assist General transfer comment: from chair and 3:1; cues for hands/LE     Exercise     Balance     End of Session OT - End of Session Activity Tolerance: Patient tolerated treatment well  Patient left: in bed;with call bell/phone within reach;with family/visitor present  [place] 02/11/2013, 12:19 PM Lesle Chris,  OTR/L [phone] 02/11/2013

## 2013-02-11 NOTE — Progress Notes (Signed)
Utilization review completed.  

## 2013-02-11 NOTE — Progress Notes (Signed)
Physical Therapy Treatment Patient Details Name: Shannon [name] MRN: [id number] DOB: November 18, 1945 Today's Date: 02/11/2013 Time: 2694-8546 PT Time Calculation (min): 20 min  PT Assessment / Plan / Recommendation  History of Present Illness LTKA   PT Comments   progressing well. Plans DC tomorrow.   Follow Up Recommendations  Home health PT     Does the patient have the potential to tolerate intense rehabilitation     Barriers to Discharge        Equipment Recommendations  None recommended by PT    Recommendations for Other Services    Frequency 7X/week   Progress towards PT Goals Progress towards PT goals: Progressing toward goals  Plan Current plan remains appropriate    Precautions / Restrictions Precautions Precautions: Knee Required Braces or Orthoses: Knee Immobilizer - Left Knee Immobilizer - Left: Discontinue once straight leg raise with < 10 degree lag Restrictions Weight Bearing Restrictions: No   Pertinent Vitals/Pain 4 l knee.    Mobility  Bed Mobility Bed Mobility: Supine to Sit Supine to sit: Min guard Transfers Overall transfer level: Needs assistance Equipment used: Rolling walker (2 wheeled) Transfers: Sit to/from Stand Sit to Stand: Min guard General transfer comment: from bed and to bed, cues for  hand placement. Ambulation/Gait Ambulation/Gait assistance: Supervision Ambulation Distance (Feet): 100 Feet Assistive device: Rolling walker (2 wheeled) Gait Pattern/deviations: Step-through pattern General Gait Details: cues for sequence.    Exercises Total Joint Exercises Quad Sets: AROM;Both;10 reps;Supine Heel Slides: AAROM;10 reps;Supine;Left Hip ABduction/ADduction: AAROM;Left;10 reps;Supine Straight Leg Raises: AAROM;Left;10 reps;Supine Goniometric ROM: 10-50 l knee   PT Diagnosis:    PT Problem List:   PT Treatment Interventions:     PT Goals (current goals can now be found in the care plan section)    Visit Information  Last PT  Received On: 02/11/13 Assistance Needed: +1 History of Present Illness: LTKA    Subjective Data      Cognition  Cognition Arousal/Alertness: Awake/alert Behavior During Therapy: WFL for tasks assessed/performed Overall Cognitive Status: Within Functional Limits for tasks assessed    Balance     End of Session PT - End of Session Equipment Utilized During Treatment: Left knee immobilizer Activity Tolerance: Patient tolerated treatment well Patient left: in bed;with family/visitor present   GP     [name] 02/11/2013, 3:08 PM

## 2013-02-11 NOTE — Discharge Instructions (Addendum)
Information on my medicine - XARELTO (Rivaroxaban)  This medication education was reviewed with me or my healthcare representative as part of my discharge preparation.  The pharmacist that spoke with me during my hospital stay was:  Emiliano Dyer, RPH  Why was Xarelto prescribed for you? Xarelto was prescribed for you to reduce the risk of a blood clots forming after orthopedic surgery OR to reduce the risk of forming blood clots that cause a stroke if you have a medical condition called atrial fibrillation (a type of irregular heartbeat).  What do you need to know about xarelto ? Take your Xarelto ONCE DAILY at the same time every day with your evening meal. If you have difficulty swallowing the tablet whole, you may crush it and mix in applesauce just prior to taking your dose.  Take Xarelto exactly as prescribed by your doctor and DO NOT stop taking Xarelto without talking to the doctor who prescribed the medication.  Stopping without other stroke or VTE prevention medication to take the place of Xarelto may increase your risk of developing a new clot or stroke.  Refill your prescription before you run out.  After discharge, you should have regular check-up appointments with your healthcare provider that is prescribing your Xarelto.  In the future your dose may need to be changed if your kidney function or weight changes by a significant amount.  What do you do if you miss a dose? If you are taking Xarelto ONCE DAILY and you miss a dose, take it as soon as you remember on the same day then continue your regularly scheduled once daily regimen the next day. Do not take two doses of Xarelto at the same time.   Important Safety Information A possible side effect of Xarelto is bleeding. You should call your healthcare provider right away if you experience any of the following:   Bleeding from an injury or your nose that does not stop.   Unusual colored urine (red or dark brown) or  unusual colored stools (red or black).   Unusual bruising for unknown reasons.   A serious fall or if you hit your head (even if there is no bleeding).  Some medicines may interact with Xarelto and might increase your risk of bleeding while on Xarelto. To help avoid this, consult your healthcare provider or pharmacist prior to using any new prescription or non-prescription medications, including herbals, vitamins, non-steroidal anti-inflammatory drugs (NSAIDs) and supplements.  This website has more information on Xarelto: [email or web address].[email or web address]    Dr. Gaynelle Arabian Total Joint Specialist [facility] [address], [place], [place] [address] [phone]  TOTAL KNEE REPLACEMENT POSTOPERATIVE DIRECTIONS    Knee Rehabilitation, Guidelines Following Surgery  Results after knee surgery are often greatly improved when you follow the exercise, range of motion and muscle strengthening exercises prescribed by your doctor. Safety measures are also important to protect the knee from further injury. Any time any of these exercises cause you to have increased pain or swelling in your knee joint, decrease the amount until you are comfortable again and slowly increase them. If you have problems or questions, call your caregiver or physical therapist for advice.   HOME CARE INSTRUCTIONS  Remove items at home which could result in a fall. This includes throw rugs or furniture in walking pathways.  Continue medications as instructed at time of discharge. You may have some home medications which will be placed on hold until you complete the course of blood thinner medication.  You may start showering once you are discharged home but do not submerge the incision under water. Just pat the incision dry and apply a dry gauze dressing on daily. Walk with walker as instructed.  You may resume a sexual relationship in one month or when given the OK by  your doctor.   Use walker as long as  suggested by your caregivers.  Avoid periods of inactivity such as sitting longer than an hour when not asleep. This helps prevent blood clots.  You may put full weight on your legs and walk as much as is comfortable.  You may return to work once you are cleared by your doctor.  Do not drive a car for 6 weeks or until released by you surgeon.   Do not drive while taking narcotics.  Wear the elastic stockings for three weeks following surgery during the day but you may remove then at night. Make sure you keep all of your appointments after your operation with all of your doctors and caregivers. You should call the office at the above phone number and make an appointment for approximately two weeks after the date of your surgery. Change the dressing daily and reapply a dry dressing each time. Please pick up a stool softener and laxative for home use as long as you are requiring pain medications.  Continue to use ice on the knee for pain and swelling from surgery. You may notice swelling that will progress down to the foot and ankle.  This is normal after surgery.  Elevate the leg when you are not up walking on it.   It is important for you to complete the blood thinner medication as prescribed by your doctor.  Continue to use the breathing machine which will help keep your temperature down.  It is common for your temperature to cycle up and down following surgery, especially at night when you are not up moving around and exerting yourself.  The breathing machine keeps your lungs expanded and your temperature down.  RANGE OF MOTION AND STRENGTHENING EXERCISES  Rehabilitation of the knee is important following a knee injury or an operation. After just a few days of immobilization, the muscles of the thigh which control the knee become weakened and shrink (atrophy). Knee exercises are designed to build up the tone and strength of the thigh muscles and to improve knee motion. Often times heat used for  twenty to thirty minutes before working out will loosen up your tissues and help with improving the range of motion but do not use heat for the first two weeks following surgery. These exercises can be done on a training (exercise) mat, on the floor, on a table or on a bed. Use what ever works the best and is most comfortable for you Knee exercises include:  Leg Lifts - While your knee is still immobilized in a splint or cast, you can do straight leg raises. Lift the leg to 60 degrees, hold for 3 sec, and slowly lower the leg. Repeat 10-20 times 2-3 times daily. Perform this exercise against resistance later as your knee gets better.  Quad and Hamstring Sets - Tighten up the muscle on the front of the thigh (Quad) and hold for 5-10 sec. Repeat this 10-20 times hourly. Hamstring sets are done by pushing the foot backward against an object and holding for 5-10 sec. Repeat as with quad sets.  A rehabilitation program following serious knee injuries can speed recovery and prevent re-injury in the future  due to weakened muscles. Contact your doctor or a physical therapist for more information on knee rehabilitation.   SKILLED REHAB INSTRUCTIONS: If the patient is transferred to a skilled rehab facility following release from the hospital, a list of the current medications will be sent to the facility for the patient to continue.  When discharged from the skilled rehab facility, please have the facility set up the patient's Home Health Physical Therapy prior to being released. Also, the skilled facility will be responsible for providing the patient with their medications at time of release from the facility to include their pain medication, the muscle relaxants, and their blood thinner medication. If the patient is still at the rehab facility at time of the two week follow up appointment, the skilled rehab facility will also need to assist the patient in arranging follow up appointment in our office and any  transportation needs.  MAKE SURE YOU:  Understand these instructions.  Will watch your condition.  Will get help right away if you are not doing well or get worse.    Pick up stool softner and laxative for home. Do not submerge incision under water. May shower. Continue to use ice for pain and swelling from surgery.  Take Xarelto for two and a half more weeks, then discontinue Xarelto. Once the patient has completed the Xarelto, they may resume the 81 mg Aspirin.

## 2013-02-11 NOTE — Progress Notes (Signed)
Subjective: 1 Day Post-Op Procedure(s) (LRB): LEFT TOTAL KNEE ARTHROPLASTY (Left) Patient reports pain as mild and moderate.   Patient seen in rounds with Dr. Wynelle Link. Patient is well, but has had some minor complaints of pain in the knee, requiring pain medications We will start therapy today. Sat on the side of the bed last night and dangled. Plan is to go Home after hospital stay.  Objective: Vital signs in last 24 hours: Temp:  [96.7 F (35.9 C)-98.4 F (36.9 C)] 97.6 F (36.4 C) (01/13 0610) Pulse Rate:  [64-85] 80 (01/13 0610) Resp:  [12-19] 16 (01/13 0610) BP: (100-148)/(53-84) 110/78 mmHg (01/13 0610) SpO2:  [95 %-100 %] 95 % (01/13 0610) Weight:  [91.627 kg (202 lb)] 91.627 kg (202 lb) (01/12 2000)  Intake/Output from previous day:  Intake/Output Summary (Last 24 hours) at 02/11/13 0708 Last data filed at 02/11/13 5093  Gross per 24 hour  Intake 3116.25 ml  Output   2755 ml  Net 361.25 ml    Intake/Output this shift:    Labs:  Recent Labs  02/11/13 0538  HGB 10.1*    Recent Labs  02/11/13 0538  WBC 11.9*  RBC 3.53*  HCT 31.7*  PLT 203   No results found for this basename: NA, K, CL, CO2, BUN, CREATININE, GLUCOSE, CALCIUM,  in the last 72 hours No results found for this basename: LABPT, INR,  in the last 72 hours  EXAM General - Patient is Alert, Appropriate and Oriented Extremity - Neurovascular intact Sensation intact distally Dorsiflexion/Plantar flexion intact Dressing - dressing C/D/I Motor Function - intact, moving foot and toes well on exam.  Hemovac pulled without difficulty.  Past Medical History  Diagnosis Date  . NICM (nonischemic cardiomyopathy) 03/08/2010    likely 2/2 chemotx - a. Echo 2012: EF 45-50%;  b. Lex MV 2/12:  low risk, apical defect (small area of ischemia vs shifting breast atten);  c.  Echo 7/12: Normal wall thickness, EF 60-65%, normal wall motion, grade 1 diastolic dysfunction, mild LAE, PASP 32;   d. Lex MV 11/13:   EF 76%, no ischemia  . GERD 08/11/2008  . HYPERLIPIDEMIA 08/11/2008  . HYPERTENSION 08/11/2008  . LIPOMA 02/10/2009  . PREDIABETES 06/15/2009  . 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 Carpal tunnel syndrome of left wrist   . Neuropathy   . Interstitial cystitis   . Brain tumor   . DEPRESSION 08/11/2008  . 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 Osteoporosis   . Hyponatremia 08/28/2011  . Anemia 08/28/2011  . History of chicken pox   . H/O measles   . H/O mumps   . Dermatitis   . OA (osteoarthritis) 09/07/2011  . Melanoma 2008    DR. Ronnald Ramp  . Shortness of breath   . Asthma   . History of blood transfusion   . UTI (lower urinary tract infection) 03/03/2012  . Constipation 03/03/2012  . 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   Weight as of this encounter: 91.627 kg (202 lb). Advance diet Up with therapy Plan for discharge tomorrow Discharge home with home health  DVT Prophylaxis -  Xarelto Weight-Bearing as tolerated to left leg D/C O2 and Pulse OX and try on Room Air  PERKINS, ALEXZANDREW 02/11/2013, 7:08 AM

## 2013-02-12 ENCOUNTER — Other Ambulatory Visit (HOSPITAL_COMMUNITY): Payer: Medicare Other

## 2013-02-12 LAB — CBC
HCT: 28.1 % — ABNORMAL LOW (ref 36.0–46.0)
Hemoglobin: 9.1 g/dL — ABNORMAL LOW (ref 12.0–15.0)
MCH: 29.3 pg (ref 26.0–34.0)
MCHC: 32.4 g/dL (ref 30.0–36.0)
MCV: 90.4 fL (ref 78.0–100.0)
PLATELETS: 214 10*3/uL (ref 150–400)
RBC: 3.11 MIL/uL — AB (ref 3.87–5.11)
RDW: 13.7 % (ref 11.5–15.5)
WBC: 15.8 10*3/uL — ABNORMAL HIGH (ref 4.0–10.5)

## 2013-02-12 LAB — BASIC METABOLIC PANEL
BUN: 14 mg/dL (ref 6–23)
CALCIUM: 8.8 mg/dL (ref 8.4–10.5)
CO2: 28 meq/L (ref 19–32)
Chloride: 102 mEq/L (ref 96–112)
Creatinine, Ser: 0.75 mg/dL (ref 0.50–1.10)
GFR, EST NON AFRICAN AMERICAN: 85 mL/min — AB (ref >90)
Glucose, Bld: 141 mg/dL — ABNORMAL HIGH (ref 70–99)
Potassium: 4.6 mEq/L (ref 3.7–5.3)
SODIUM: 139 meq/L (ref 137–147)

## 2013-02-12 MED ORDER — TRAMADOL HCL 50 MG PO TABS: 50.0000 mg | ORAL_TABLET | Freq: Four times a day (QID) | ORAL | Status: DC | PRN

## 2013-02-12 MED ORDER — RIVAROXABAN 10 MG PO TABS: 10.0000 mg | ORAL_TABLET | Freq: Every day | ORAL | Status: DC

## 2013-02-12 MED ORDER — METHOCARBAMOL 500 MG PO TABS: 500.0000 mg | ORAL_TABLET | Freq: Four times a day (QID) | ORAL | Status: DC | PRN

## 2013-02-12 NOTE — Progress Notes (Signed)
Physical Therapy Treatment Patient Details Name: Shannon [name] MRN: [id number] DOB: 09-08-1945 Today's Date: 02/12/2013 Time: 6063-0160 PT Time Calculation (min): 41 min  PT Assessment / Plan / Recommendation  History of Present Illness LTKA   PT Comments   Pt reports being loopey. Pt required frequent cues for safety. Will see how pt does this PM per MD for safety. Needs to practice steps.  Follow Up Recommendations  Home health PT     Does the patient have the potential to tolerate intense rehabilitation     Barriers to Discharge        Equipment Recommendations  None recommended by PT    Recommendations for Other Services    Frequency 7X/week   Progress towards PT Goals Progress towards PT goals: Progressing toward goals  Plan Current plan remains appropriate    Precautions / Restrictions Precautions Precautions: Fall   Pertinent Vitals/Pain 5 L knee, ice applied.    Mobility  Transfers Overall transfer level: Needs assistance Equipment used: Rolling walker (2 wheeled) Transfers: Sit to/from Stand Sit to Stand: Min guard General transfer comment: from bed and to bed, cues for  hand placement. Ambulation/Gait Ambulation/Gait assistance: Supervision Ambulation Distance (Feet): 100 Feet Assistive device: Rolling walker (2 wheeled) Gait Pattern/deviations: Step-through pattern;Antalgic General Gait Details: cues for sequence.    Exercises Total Joint Exercises Quad Sets: AROM;Both;10 reps;Supine Short Arc Quad: AROM;Left;10 reps Heel Slides: AAROM;Left;10 reps Hip ABduction/ADduction: AAROM;Left;10 reps Straight Leg Raises: AAROM;Left;10 reps Goniometric ROM: 5-60   PT Diagnosis:    PT Problem List:   PT Treatment Interventions:     PT Goals (current goals can now be found in the care plan section)    Visit Information  Last PT Received On: 02/12/13 Assistance Needed: +1 History of Present Illness: LTKA    Subjective Data      Cognition   Cognition Arousal/Alertness: Awake/alert Behavior During Therapy: Anxious;Impulsive    Balance     End of Session PT - End of Session Activity Tolerance: Patient tolerated treatment well Patient left: in chair;with call bell/phone within reach Nurse Communication: Mobility status   GP     [name] 02/12/2013, 12:58 PM Tresa Endo PT [phone]

## 2013-02-12 NOTE — Care Management Note (Signed)
    Page 1 of 1   02/12/2013     1:25:17 PM   CARE MANAGEMENT NOTE 02/12/2013  Patient:  Shannon [name], Shannon [name]   Account Number:  [id number]  Date Initiated:  02/11/2013  Documentation initiated by:  Sherrin Daisy  Subjective/Objective Assessment:   dx total knee replacemnt-left    Referral from doctor's office to [facility] for [facility] services which will start day after discharge.     Action/Plan:   CM spoke with patient & spouse. Plans are for patient to home where spouse will be caregiver. She has DME-RW and commode seat.   Anticipated DC Date:  02/12/2013   Anticipated DC Plan:  [place]  CM consult      [facility] Choice  HOME HEALTH   Choice offered to / List presented to:  C-1 Patient        [place] arranged  HH-2 PT      [place]   Status of service:  Completed, signed off Medicare Important Message given?  NA - LOS <3 / Initial given by admissions (If response is "NO", the following Medicare IM given date fields will be blank) Date Medicare IM given:   Date Additional Medicare IM given:    Discharge Disposition:  [place]  Per UR Regulation:    If discussed at Long Length of Stay Meetings, dates discussed:    Comments:

## 2013-02-12 NOTE — Progress Notes (Signed)
Subjective: 2 Days Post-Op Procedure(s) (LRB): LEFT TOTAL KNEE ARTHROPLASTY (Left) Patient reports pain as mild.   Patient seen in rounds for Dr. Wynelle Link. She became confused last night.  Will decrease the meds.  She does orient to place without difficulty.  Will see how she does today with therapy and Tramadol. Patient is well, but has had some minor complaints of pain in the knee, requiring pain medications Patient may be ready if improves by this afternoon but will see how she does.  Objective: Vital signs in last 24 hours: Temp:  [97.7 F (36.5 C)-98.5 F (36.9 C)] 98.2 F (36.8 C) (01/14 0600) Pulse Rate:  [74-88] 74 (01/14 0600) Resp:  [16-18] 18 (01/14 0600) BP: (96-152)/(63-87) 139/87 mmHg (01/14 0600) SpO2:  [95 %-98 %] 98 % (01/14 0600)  Intake/Output from previous day:  Intake/Output Summary (Last 24 hours) at 02/12/13 0804 Last data filed at 02/12/13 0600  Gross per 24 hour  Intake 394.58 ml  Output    750 ml  Net -355.42 ml    Intake/Output this shift:    Labs:  Recent Labs  02/11/13 0538  HGB 10.1*    Recent Labs  02/11/13 0538  WBC 11.9*  RBC 3.53*  HCT 31.7*  PLT 203    Recent Labs  02/11/13 0538 02/12/13 0532  NA 138 139  K 4.6 4.6  CL 101 102  CO2 24 28  BUN 12 14  CREATININE 0.74 0.75  GLUCOSE 159* 141*  CALCIUM 8.4 8.8   No results found for this basename: LABPT, INR,  in the last 72 hours  EXAM: General - Patient is Alert, Appropriate and Oriented Extremity - Neurovascular intact Sensation intact distally Dorsiflexion/Plantar flexion intact Incision - clean, dry, no drainage Motor Function - intact, moving foot and toes well on exam.   Assessment/Plan: 2 Days Post-Op Procedure(s) (LRB): LEFT TOTAL KNEE ARTHROPLASTY (Left) Procedure(s) (LRB): LEFT TOTAL KNEE ARTHROPLASTY (Left) Past Medical History  Diagnosis Date  . NICM (nonischemic cardiomyopathy) 03/08/2010    likely 2/2 chemotx - a. Echo 2012: EF 45-50%;  b. Lex  MV 2/12:  low risk, apical defect (small area of ischemia vs shifting breast atten);  c.  Echo 7/12: Normal wall thickness, EF 60-65%, normal wall motion, grade 1 diastolic dysfunction, mild LAE, PASP 32;   d. Lex MV 11/13:  EF 76%, no ischemia  . GERD 08/11/2008  . HYPERLIPIDEMIA 08/11/2008  . HYPERTENSION 08/11/2008  . LIPOMA 02/10/2009  . PREDIABETES 06/15/2009  . 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 Carpal tunnel syndrome of left wrist   . Neuropathy   . Interstitial cystitis   . Brain tumor   . DEPRESSION 08/11/2008  . 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 Osteoporosis   . Hyponatremia 08/28/2011  . Anemia 08/28/2011  . History of chicken pox   . H/O measles   . H/O mumps   . Dermatitis   . OA (osteoarthritis) 09/07/2011  . Melanoma 2008    DR. Ronnald Ramp  . Shortness of breath   . Asthma   . History of blood transfusion   . UTI (lower urinary tract infection) 03/03/2012  . Constipation 03/03/2012  . 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   Weight as of this encounter: 91.627 kg (202 lb). Up with therapy Discharge home with home health if improved Diet - Cardiac diet Follow up - in 2 weeks Activity - WBAT Disposition - Home Condition Upon Discharge - Pending at this time D/C Meds - See DC Summary DVT Prophylaxis - Xarelto  Arlyn Buerkle 02/12/2013, 8:04 AM

## 2013-02-12 NOTE — Discharge Summary (Signed)
Physician Discharge Summary   Patient ID: Shannon [name] MRN: [id number] DOB/AGE: 69-Dec-1947 69 y.o.  Admit date: 02/10/2013 Discharge date: 02-13-2013  Primary Diagnosis:  Osteoarthritis Left knee(s)  Admission Diagnoses:  Past Medical History  Diagnosis Date  . NICM (nonischemic cardiomyopathy) 03/08/2010    likely 2/2 chemotx - a. Echo 2012: EF 45-50%;  b. Lex MV 2/12:  low risk, apical defect (small area of ischemia vs shifting breast atten);  c.  Echo 7/12: Normal wall thickness, EF 60-65%, normal wall motion, grade 1 diastolic dysfunction, mild LAE, PASP 32;   d. Lex MV 11/13:  EF 76%, no ischemia  . GERD 08/11/2008  . HYPERLIPIDEMIA 08/11/2008  . HYPERTENSION 08/11/2008  . LIPOMA 02/10/2009  . PREDIABETES 06/15/2009  . 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 Carpal tunnel syndrome of left wrist   . Neuropathy   . Interstitial cystitis   . Brain tumor   . DEPRESSION 08/11/2008  . 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 Osteoporosis   . Hyponatremia 08/28/2011  . Anemia 08/28/2011  . History of chicken pox   . H/O measles   . H/O mumps   . Dermatitis   . OA (osteoarthritis) 09/07/2011  . Melanoma 2008    DR. Ronnald Ramp  . Shortness of breath   . Asthma   . History of blood transfusion   . UTI (lower urinary tract infection) 03/03/2012  . Constipation 03/03/2012  . 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   Weight as of this encounter: 91.627 kg (202 lb).  Procedure:  Procedure(s) (LRB): LEFT TOTAL KNEE ARTHROPLASTY (Left)   Consults: None  HPI: Shannon [name] is a 68 y.o. year old female with end stage OA of her left knee  with progressively worsening pain and dysfunction. She has constant pain, with activity and at rest and significant functional deficits with difficulties even with ADLs. She has had extensive non-op management including analgesics, injections of cortisone and viscosupplements, and home exercise program, but remains in significant pain with significant dysfunction. Radiographs show bone on bone arthritis patellofemoral with significant bony erosion of the patella. She presents now for left Total Knee Arthroplasty.   Laboratory Data: Admission on 02/10/2013  Component Date Value Range Status  . WBC 02/11/2013 11.9* 4.0 - 10.5 K/uL Final  . RBC 02/11/2013 3.53* 3.87 - 5.11 MIL/uL Final  . Hemoglobin 02/11/2013 10.1* 12.0 - 15.0 g/dL Final  . HCT 02/11/2013 31.7* 36.0 - 46.0 % Final  . MCV 02/11/2013 89.8  78.0 - 100.0 fL Final  . MCH 02/11/2013 28.6  26.0 - 34.0 pg Final  . MCHC 02/11/2013 31.9  30.0 - 36.0 g/dL Final  . RDW 02/11/2013 13.3  11.5 - 15.5 % Final  . Platelets 02/11/2013 203  150 - 400 K/uL Final  . Sodium 02/11/2013 138  137 - 147 mEq/L Final  . Potassium 02/11/2013 4.6  3.7 - 5.3 mEq/L Final  . Chloride 02/11/2013 101  96 - 112 mEq/L Final  . CO2 02/11/2013 24  19 - 32 mEq/L Final  . Glucose, Bld 02/11/2013  159* 70 - 99 mg/dL Final  . BUN 02/11/2013 12  6 - 23 mg/dL Final  . Creatinine, Ser 02/11/2013 0.74  0.50 - 1.10 mg/dL Final  . Calcium 02/11/2013 8.4  8.4 - 10.5 mg/dL Final  . GFR calc non Af Amer 02/11/2013 85* >90 mL/min Final  . GFR calc Af Amer 02/11/2013 >90  >90 mL/min Final   Comment: (NOTE)                          The eGFR has been calculated using the CKD EPI equation.                          This calculation has not been validated in all clinical situations.                          eGFR's persistently <90 mL/min signify possible Chronic Kidney                          Disease.  . WBC 02/12/2013 15.8* 4.0 - 10.5 K/uL Final  . RBC 02/12/2013 3.11* 3.87 -  5.11 MIL/uL Final  . Hemoglobin 02/12/2013 9.1* 12.0 - 15.0 g/dL Final  . HCT 02/12/2013 28.1* 36.0 - 46.0 % Final  . MCV 02/12/2013 90.4  78.0 - 100.0 fL Final  . MCH 02/12/2013 29.3  26.0 - 34.0 pg Final  . MCHC 02/12/2013 32.4  30.0 - 36.0 g/dL Final  . RDW 02/12/2013 13.7  11.5 - 15.5 % Final  . Platelets 02/12/2013 214  150 - 400 K/uL Final  . Sodium 02/12/2013 139  137 - 147 mEq/L Final  . Potassium 02/12/2013 4.6  3.7 - 5.3 mEq/L Final  . Chloride 02/12/2013 102  96 - 112 mEq/L Final  . CO2 02/12/2013 28  19 - 32 mEq/L Final  . Glucose, Bld 02/12/2013 141* 70 - 99 mg/dL Final  . BUN 02/12/2013 14  6 - 23 mg/dL Final  . Creatinine, Ser 02/12/2013 0.75  0.50 - 1.10 mg/dL Final  . Calcium 02/12/2013 8.8  8.4 - 10.5 mg/dL Final  . GFR calc non Af Amer 02/12/2013 85* >90 mL/min Final  . GFR calc Af Amer 02/12/2013 >90  >90 mL/min Final   Comment: (NOTE)                          The eGFR has been calculated using the CKD EPI equation.                          This calculation has not been validated in all clinical situations.                          eGFR's persistently <90 mL/min signify possible Chronic Kidney                          Disease.  . WBC 02/13/2013 9.8  4.0 - 10.5 K/uL Final  . RBC 02/13/2013 2.99* 3.87 - 5.11 MIL/uL Final  . Hemoglobin 02/13/2013 8.6* 12.0 - 15.0 g/dL Final  . HCT 02/13/2013 27.1* 36.0 - 46.0 % Final  . MCV 02/13/2013 90.6  78.0 - 100.0 fL Final  . MCH 02/13/2013 28.8  26.0 - 34.0  pg Final  . MCHC 02/13/2013 31.7  30.0 - 36.0 g/dL Final  . RDW 02/13/2013 14.0  11.5 - 15.5 % Final  . Platelets 02/13/2013 204  150 - 400 K/uL Final  Hospital Outpatient Visit on 02/07/2013  Component Date Value Range Status  . aPTT 02/07/2013 25  24 - 37 seconds Final  . WBC 02/07/2013 4.9  4.0 - 10.5 K/uL Final  . RBC 02/07/2013 4.09  3.87 - 5.11 MIL/uL Final  . Hemoglobin 02/07/2013 11.9* 12.0 - 15.0 g/dL Final  . HCT 02/07/2013 37.5  36.0 - 46.0 % Final  . MCV  02/07/2013 91.7  78.0 - 100.0 fL Final  . MCH 02/07/2013 29.1  26.0 - 34.0 pg Final  . MCHC 02/07/2013 31.7  30.0 - 36.0 g/dL Final  . RDW 02/07/2013 13.9  11.5 - 15.5 % Final  . Platelets 02/07/2013 239  150 - 400 K/uL Final  . Sodium 02/07/2013 144  137 - 147 mEq/L Final  . Potassium 02/07/2013 4.8  3.7 - 5.3 mEq/L Final  . Chloride 02/07/2013 105  96 - 112 mEq/L Final  . CO2 02/07/2013 30  19 - 32 mEq/L Final  . Glucose, Bld 02/07/2013 90  70 - 99 mg/dL Final  . BUN 02/07/2013 14  6 - 23 mg/dL Final  . Creatinine, Ser 02/07/2013 1.05  0.50 - 1.10 mg/dL Final  . Calcium 02/07/2013 10.0  8.4 - 10.5 mg/dL Final  . Total Protein 02/07/2013 6.8  6.0 - 8.3 g/dL Final  . Albumin 02/07/2013 3.8  3.5 - 5.2 g/dL Final  . AST 02/07/2013 26  0 - 37 U/L Final  . ALT 02/07/2013 27  0 - 35 U/L Final  . Alkaline Phosphatase 02/07/2013 104  39 - 117 U/L Final  . Total Bilirubin 02/07/2013 0.2* 0.3 - 1.2 mg/dL Final  . GFR calc non Af Amer 02/07/2013 53* >90 mL/min Final  . GFR calc Af Amer 02/07/2013 62* >90 mL/min Final   Comment: (NOTE)                          The eGFR has been calculated using the CKD EPI equation.                          This calculation has not been validated in all clinical situations.                          eGFR's persistently <90 mL/min signify possible Chronic Kidney                          Disease.  Marland Kitchen Prothrombin Time 02/07/2013 12.1  11.6 - 15.2 seconds Final  . INR 02/07/2013 0.91  0.00 - 1.49 Final  . ABO/RH(D) 02/07/2013 O POS   Final  . Antibody Screen 02/07/2013 NEG   Final  . Sample Expiration 02/07/2013 02/13/2013   Final  . Color, Urine 02/07/2013 YELLOW  YELLOW Final  . APPearance 02/07/2013 CLEAR  CLEAR Final  . Specific Gravity, Urine 02/07/2013 1.009  1.005 - 1.030 Final  . pH 02/07/2013 7.5  5.0 - 8.0 Final  . Glucose, UA 02/07/2013 NEGATIVE  NEGATIVE mg/dL Final  . Hgb urine dipstick 02/07/2013 NEGATIVE  NEGATIVE Final  . Bilirubin Urine  02/07/2013 NEGATIVE  NEGATIVE Final  . Ketones, ur 02/07/2013 NEGATIVE  NEGATIVE mg/dL Final  .  Protein, ur 02/07/2013 NEGATIVE  NEGATIVE mg/dL Final  . Urobilinogen, UA 02/07/2013 0.2  0.0 - 1.0 mg/dL Final  . Nitrite 02/07/2013 NEGATIVE  NEGATIVE Final  . Leukocytes, UA 02/07/2013 TRACE* NEGATIVE Final  . MRSA, PCR 02/07/2013 NEGATIVE  NEGATIVE Final  . Staphylococcus aureus 02/07/2013 NEGATIVE  NEGATIVE Final   Comment:                                 The Xpert SA Assay (FDA                          approved for NASAL specimens                          in patients over 74 years of age),                          is one component of                          a comprehensive surveillance                          program.  Test performance has                          been validated by [employer] for patients greater                          than or equal to 92 year old.                          It is not intended                          to diagnose infection nor to                          guide or monitor treatment.  . WBC, UA 02/07/2013 0-2  <3 WBC/hpf Final     X-Rays:Dg Chest 2 View  02/07/2013   CLINICAL DATA:  Preop left knee replacement  EXAM: CHEST  2 VIEW  COMPARISON:  DG CHEST 1V PORT dated 02/11/2012  FINDINGS: The heart size and mediastinal contours are within normal limits. Both lungs are clear. The visualized skeletal structures are unremarkable.  IMPRESSION: No active cardiopulmonary disease.   Electronically Signed   By: Kathreen Devoid   On: 02/07/2013 13:16    EKG: Orders placed in visit on 12/30/12  . EKG 12-LEAD     Hospital Course: Shannon [name] is a 68 y.o. who was admitted to [facility]. They were brought to the operating room on 02/10/2013 and underwent Procedure(s): LEFT TOTAL KNEE ARTHROPLASTY.  Patient tolerated the procedure well and was later transferred to the recovery room and then to the orthopaedic floor for  postoperative care.  They were given PO and IV analgesics for pain control following their surgery.  They were given 24  hours of postoperative antibiotics of  Anti-infectives   Start     Dose/Rate Route Frequency Ordered Stop   02/11/13 0600  ceFAZolin (ANCEF) IVPB 2 g/50 mL premix    Comments:  Dose changed to 2g per P&T policy for weight < 761YJ.   2 g 100 mL/hr over 30 Minutes Intravenous On call to O.R. 02/10/13 1216 02/10/13 1612   02/10/13 2230  ceFAZolin (ANCEF) IVPB 2 g/50 mL premix     2 g 100 mL/hr over 30 Minutes Intravenous Every 6 hours 02/10/13 1850 02/11/13 0551     and started on DVT prophylaxis in the form of Xarelto.   PT and OT were ordered for total joint protocol.  Discharge planning consulted to help with postop disposition and equipment needs.  Patient had a rough night on the evening of surgery with moderate pain.  They started to get up OOB with therapy on day one. Hemovac drain was pulled without difficulty.  She became confused that night. Decreased the meds. She did orient to place without difficulty. Continued to work with therapy and switched to Tramadol. Continued to work with therapy into day two.  Dressing was changed on day two and the incision was healing well.  By day three, the patient had progressed with therapy and meeting their goals.  Incision was healing well.  Patient was seen in rounds by Dr. Wynelle Link and was ready to go home.   Discharge Medications: Prior to Admission medications   Medication Sig Start Date End Date Taking? Authorizing Provider  aspirin 81 MG tablet Take 81 mg by mouth daily.   Yes Historical Provider, MD  carvedilol (COREG) 6.25 MG tablet Take 6.25 mg by mouth 2 (two) times daily with a meal.   Yes Historical Provider, MD  Cholecalciferol (VITAMIN D PO) Take 1 tablet by mouth 2 (two) times daily.   Yes Historical Provider, MD  clonazePAM (KLONOPIN) 1 MG tablet Take 0.5 mg by mouth at bedtime.    Yes Historical Provider, MD  Ferrous  Fumarate 324 MG TABS Take 1 tablet by mouth daily.  01/27/13  Yes Mosie Lukes, MD  folic acid (FOLVITE) 1 MG tablet Take 1 mg by mouth daily. 12/23/12  Yes Mosie Lukes, MD  gabapentin (NEURONTIN) 300 MG capsule Take 300 mg by mouth 2 (two) times daily.    Yes Historical Provider, MD  hydrOXYzine (ATARAX/VISTARIL) 25 MG tablet Take 25 mg by mouth at bedtime.   Yes Historical Provider, MD  KRILL OIL PO Take 1 tablet by mouth daily.   Yes Historical Provider, MD  Multiple Vitamins-Minerals (MULTIVITAMIN WITH MINERALS) tablet Take 1 tablet by mouth daily.   Yes Historical Provider, MD  nystatin (MYCOSTATIN) powder Apply topically 4 (four) times daily as needed. 04/15/12  Yes Amy Milda Smart, PA-C  pentosan polysulfate (ELMIRON) 100 MG capsule Take 200 mg by mouth 2 (two) times daily. 02/07/12  Yes Dayami Taitt Dara Lords, PA-C  ranitidine (ZANTAC) 150 MG tablet Take 150 mg by mouth 2 (two) times daily.   Yes Historical Provider, MD  rosuvastatin (CRESTOR) 20 MG tablet Take 20 mg by mouth at bedtime. 03/06/12  Yes Mosie Lukes, MD  telmisartan (MICARDIS) 80 MG tablet Take 40 mg by mouth daily.    Yes Historical Provider, MD  venlafaxine XR (EFFEXOR-XR) 150 MG 24 hr capsule Take 150 mg by mouth at bedtime.  10/18/12  Yes Mosie Lukes, MD  methocarbamol (ROBAXIN) 500 MG tablet Take 1 tablet (500 mg total) by  mouth every 6 (six) hours as needed for muscle spasms. 02/12/13   Darielle Hancher, PA-C  psyllium (METAMUCIL SMOOTH TEXTURE) 28 % packet Take 1 packet by mouth daily as needed (constipation). 12/12/11   Mosie Lukes, MD  risedronate (ACTONEL) 35 MG tablet Take 35 mg by mouth every 7 (seven) days. with water on empty stomach, nothing by mouth or lie down for next 30 minutes. 01/10/13   Huel Cote, NP  rivaroxaban (XARELTO) 10 MG TABS tablet Take 1 tablet (10 mg total) by mouth daily with breakfast. Take Xarelto for two and a half more weeks, then discontinue Xarelto. Once the patient has completed  the Xarelto, they may resume the 81 mg Aspirin. 02/12/13   Isreal Moline, PA-C  sucralfate (CARAFATE) 1 G tablet Take 1 g by mouth daily as needed (acid reflex).     Historical Provider, MD  traMADol (ULTRAM) 50 MG tablet Take 1-2 tablets (50-100 mg total) by mouth every 6 (six) hours as needed (mild to moderate pain). 02/12/13   Sandara Tyree Dara Lords, PA-C    Diet: Cardiac diet Activity:WBAT Follow-up:in 2 weeks Disposition - Home Discharged Condition: Improving       Discharge Orders   Future Appointments Provider Department Dept Phone   10/21/2013 10:45 AM [facility] [phone]   10/21/2013 11:15 AM Amy Fredderick [facility] [phone]   Future Orders Complete By Expires   Call MD / Call 911  As directed    Comments:     If you experience chest pain or shortness of breath, CALL 911 and be transported to the hospital emergency room.  If you develope a fever above 101 F, pus (white drainage) or increased drainage or redness at the wound, or calf pain, call your surgeon's office.   Change dressing  As directed    Comments:     Change dressing daily with sterile 4 x 4 inch gauze dressing and apply TED hose. Do not submerge the incision under water.   Constipation Prevention  As directed    Comments:     Drink plenty of fluids.  Prune juice may be helpful.  You may use a stool softener, such as Colace (over the counter) 100 mg twice a day.  Use MiraLax (over the counter) for constipation as needed.   Diet - low sodium heart healthy  As directed    Discharge instructions  As directed    Comments:     Pick up stool softner and laxative for home. Do not submerge incision under water. May shower. Continue to use ice for pain and swelling from surgery.  Take Xarelto for two and a half more weeks, then discontinue Xarelto. Once the patient has completed the Xarelto, they may resume the 81 mg  Aspirin. ]   Do not put a pillow under the knee. Place it under the heel.  As directed    Do not sit on low chairs, stoools or toilet seats, as it may be difficult to get up from low surfaces  As directed    Driving restrictions  As directed    Comments:     No driving until released by the physician.   Increase activity slowly as tolerated  As directed    Lifting restrictions  As directed    Comments:     No lifting until released by the physician.   Patient may shower  As directed    Comments:  You may shower without a dressing once there is no drainage.  Do not wash over the wound.  If drainage remains, do not shower until drainage stops.   TED hose  As directed    Comments:     Use stockings (TED hose) for 3 weeks on both leg(s).  You may remove them at night for sleeping.   Weight bearing as tolerated  As directed    Questions:     Laterality:     Extremity:         Medication List    STOP taking these medications       aspirin 81 MG tablet     folic acid 1 MG tablet  Commonly known as:  FOLVITE     KRILL OIL PO     multivitamin with minerals tablet     risedronate 35 MG tablet  Commonly known as:  ACTONEL     VITAMIN D PO      TAKE these medications       carvedilol 6.25 MG tablet  Commonly known as:  COREG  Take 6.25 mg by mouth 2 (two) times daily with a meal.     clonazePAM 1 MG tablet  Commonly known as:  KLONOPIN  Take 0.5 mg by mouth at bedtime.     Ferrous Fumarate 324 MG Tabs  Take 1 tablet by mouth daily.     gabapentin 300 MG capsule  Commonly known as:  NEURONTIN  Take 300 mg by mouth 2 (two) times daily.     hydrOXYzine 25 MG tablet  Commonly known as:  ATARAX/VISTARIL  Take 25 mg by mouth at bedtime.     methocarbamol 500 MG tablet  Commonly known as:  ROBAXIN  Take 1 tablet (500 mg total) by mouth every 6 (six) hours as needed for muscle spasms.     nystatin powder  Commonly known as:  MYCOSTATIN  Apply topically 4 (four)  times daily as needed.     pentosan polysulfate 100 MG capsule  Commonly known as:  ELMIRON  Take 200 mg by mouth 2 (two) times daily.     psyllium 28 % packet  Commonly known as:  METAMUCIL SMOOTH TEXTURE  Take 1 packet by mouth daily as needed (constipation).     ranitidine 150 MG tablet  Commonly known as:  ZANTAC  Take 150 mg by mouth 2 (two) times daily.     rivaroxaban 10 MG Tabs tablet  Commonly known as:  XARELTO  - Take 1 tablet (10 mg total) by mouth daily with breakfast. Take Xarelto for two and a half more weeks, then discontinue Xarelto.  - Once the patient has completed the Xarelto, they may resume the 81 mg Aspirin.     rosuvastatin 20 MG tablet  Commonly known as:  CRESTOR  Take 20 mg by mouth at bedtime.     sucralfate 1 G tablet  Commonly known as:  CARAFATE  Take 1 g by mouth daily as needed (acid reflex).     telmisartan 80 MG tablet  Commonly known as:  MICARDIS  Take 40 mg by mouth daily.     traMADol 50 MG tablet  Commonly known as:  ULTRAM  Take 1-2 tablets (50-100 mg total) by mouth every 6 (six) hours as needed (mild to moderate pain).     venlafaxine XR 150 MG 24 hr capsule  Commonly known as:  EFFEXOR-XR  Take 150 mg by mouth at bedtime.  Follow-up Information   Follow up with Gearlean Alf, MD. Schedule an appointment as soon as possible for a visit in 2 weeks.   Specialty:  Orthopedic Surgery   Contact information:   [address] [place] [address] [phone]       Signed: Mickel Crow 02/13/2013, 9:37 AM

## 2013-02-12 NOTE — Progress Notes (Signed)
Physical Therapy Treatment Patient Details Name: Shannon [name] MRN: [id number] DOB: [birth year] Today's Date: 02/12/2013 Time: 0349-1791 PT Time Calculation (min): 31 min  PT Assessment / Plan / Recommendation  History of Present Illness LTKA   PT Comments   Pt  Requires cues for safety, not to walk away from RW. Pt's spouse present for step instruction and that pt is to use KI to get up the steps.  Pt's DC pending due to AMS/decreased safety.  Follow Up Recommendations  Home health PT     Does the patient have the potential to tolerate intense rehabilitation     Barriers to Discharge        Equipment Recommendations  None recommended by PT    Recommendations for Other Services    Frequency 7X/week   Progress towards PT Goals Progress towards PT goals: Progressing toward goals  Plan Current plan remains appropriate    Precautions / Restrictions Precautions Precautions: Fall Precaution Comments: decreased safety Required Braces or Orthoses: Knee Immobilizer - Left   Pertinent Vitals/Pain Min. C/o pain     Mobility  Bed Mobility Supine to sit: Supervision Transfers Overall transfer level: Needs assistance Equipment used: Rolling walker (2 wheeled) Transfers: Sit to/from Stand Sit to Stand: Min guard General transfer comment: from bed and to recliner, cues for  hand placement. Cues for safety. Pt walkied away from RW several times. Cues for keeping it with her. spouse present and  aware  of safety issues. Ambulation/Gait Ambulation/Gait assistance: Min guard;Supervision Ambulation Distance (Feet): 330 Feet Assistive device: Rolling walker (2 wheeled) Gait Pattern/deviations: Step-through pattern;Antalgic General Gait Details: spouse present to accompany/assist pt. Stairs: Yes Stairs assistance: Min assist Stair Management: Backwards;No rails;One rail Right;Step to pattern;Forwards;With walker;With cane Number of Stairs: 4 General stair comments: practiced backward and  forward with spouse, cues for safety and sequence.    Exercises Total Joint Exercises Quad Sets: AROM;Both;10 reps;Supine Short Arc Quad: AROM;Left;10 reps Heel Slides: AAROM;Left;10 reps Hip ABduction/ADduction: AAROM;Left;10 reps Straight Leg Raises: AAROM;Left;10 reps Goniometric ROM: 5-60   PT Diagnosis:    PT Problem List:   PT Treatment Interventions:     PT Goals (current goals can now be found in the care plan section)    Visit Information  Last PT Received On: 02/12/13 Assistance Needed: +1 History of Present Illness: LTKA    Subjective Data      Cognition  Cognition Arousal/Alertness: Awake/alert Behavior During Therapy: Anxious;Impulsive Overall Cognitive Status: Impaired/Different from baseline Area of Impairment: Memory;Safety/judgement Memory: Decreased recall of precautions Safety/Judgement: Decreased awareness of safety    Balance     End of Session PT - End of Session Equipment Utilized During Treatment: Left knee immobilizer Activity Tolerance: Patient tolerated treatment well Patient left: in chair;with call bell/phone within reach;with family/visitor present Nurse Communication: Mobility status   GP     Shannon [name] 02/12/2013, 3:04 PM Shannon [name] PT [phone]

## 2013-02-12 NOTE — Progress Notes (Signed)
Out of bed twice this shift without assistance and without using call bell. Alert and oriented both times. Gait steady even after narcotics and muscle relaxant. Bed alarm for safety.

## 2013-02-13 DIAGNOSIS — R4182 Altered mental status, unspecified: Secondary | ICD-10-CM

## 2013-02-13 DIAGNOSIS — D62 Acute posthemorrhagic anemia: Secondary | ICD-10-CM

## 2013-02-13 HISTORY — DX: Acute posthemorrhagic anemia: D62

## 2013-02-13 LAB — CBC
HCT: 27.1 % — ABNORMAL LOW (ref 36.0–46.0)
Hemoglobin: 8.6 g/dL — ABNORMAL LOW (ref 12.0–15.0)
MCH: 28.8 pg (ref 26.0–34.0)
MCHC: 31.7 g/dL (ref 30.0–36.0)
MCV: 90.6 fL (ref 78.0–100.0)
PLATELETS: 204 10*3/uL (ref 150–400)
RBC: 2.99 MIL/uL — AB (ref 3.87–5.11)
RDW: 14 % (ref 11.5–15.5)
WBC: 9.8 10*3/uL (ref 4.0–10.5)

## 2013-02-13 MED ORDER — LEVALBUTEROL HCL 0.63 MG/3ML IN NEBU
0.6300 mg | INHALATION_SOLUTION | Freq: Once | RESPIRATORY_TRACT | Status: AC
  Administered 2013-02-13: 0.63 mg via RESPIRATORY_TRACT
  Filled 2013-02-13: qty 3

## 2013-02-13 NOTE — Progress Notes (Signed)
Physical Therapy Treatment Patient Details Name: Shannon [name] MRN: [id number] DOB: [birth year] Today's Date: 02/13/2013 Time: 9741-6384 PT Time Calculation (min): 38 min  PT Assessment / Plan / Recommendation  History of Present Illness LTKA   PT Comments   Pt is imoproved today except c/o wheezing. PA informed. Pt is to DC today.  Follow Up Recommendations  Home health PT     Does the patient have the potential to tolerate intense rehabilitation     Barriers to Discharge        Equipment Recommendations  None recommended by PT    Recommendations for Other Services    Frequency     Progress towards PT Goals Progress towards PT goals: Progressing toward goals  Plan Current plan remains appropriate    Precautions / Restrictions Precautions Precautions: Fall;Knee   Pertinent Vitals/Pain Reports L knee is stiff, improved with ambulation.    Mobility  Transfers Equipment used: Rolling walker (2 wheeled) Transfers: Sit to/from Stand Sit to Stand: Supervision Ambulation/Gait Ambulation/Gait assistance: Supervision Ambulation Distance (Feet): 200 Feet Assistive device: Rolling walker (2 wheeled) General Gait Details: pt  improved in safety w/ RW.    Exercises Total Joint Exercises Long Arc Quad: AROM;Left;10 reps Knee Flexion: AROM;Left;10 reps   PT Diagnosis:    PT Problem List:   PT Treatment Interventions:     PT Goals (current goals can now be found in the care plan section)    Visit Information  Last PT Received On: 02/13/13 Assistance Needed: +1 History of Present Illness: LTKA    Subjective Data      Cognition  Cognition Arousal/Alertness: Awake/alert    Balance     End of Session PT - End of Session Activity Tolerance: Patient tolerated treatment well Patient left: in chair;with call bell/phone within reach Nurse Communication: Mobility status   GP     [name] 02/13/2013, 8:58 AM Tresa Endo PT [phone]

## 2013-02-13 NOTE — Progress Notes (Signed)
Subjective: 3 Days Post-Op Procedure(s) (LRB): LEFT TOTAL KNEE ARTHROPLASTY (Left) Patient reports pain as mild.   Patient seen in rounds by Dr. Wynelle Link.  Doing better this morning. Patient is well, and has had no acute complaints or problems Patient is ready to go home today.  Objective: Vital signs in last 24 hours: Temp:  [98.3 F (36.8 C)-98.5 F (36.9 C)] 98.4 F (36.9 C) (01/15 0537) Pulse Rate:  [73-93] 79 (01/15 0537) Resp:  [16-18] 17 (01/15 0537) BP: (135-163)/(79-90) 163/90 mmHg (01/15 0537) SpO2:  [96 %-98 %] 98 % (01/15 0537)  Intake/Output from previous day:  Intake/Output Summary (Last 24 hours) at 02/13/13 0826 Last data filed at 02/13/13 0538  Gross per 24 hour  Intake    960 ml  Output   1000 ml  Net    -40 ml    Intake/Output this shift:    Labs:  Recent Labs  02/11/13 0538 02/12/13 0532 02/13/13 0551  HGB 10.1* 9.1* 8.6*    Recent Labs  02/12/13 0532 02/13/13 0551  WBC 15.8* 9.8  RBC 3.11* 2.99*  HCT 28.1* 27.1*  PLT 214 204    Recent Labs  02/11/13 0538 02/12/13 0532  NA 138 139  K 4.6 4.6  CL 101 102  CO2 24 28  BUN 12 14  CREATININE 0.74 0.75  GLUCOSE 159* 141*  CALCIUM 8.4 8.8   No results found for this basename: LABPT, INR,  in the last 72 hours  EXAM: General - Patient is Alert, Appropriate and Oriented Extremity - Neurovascular intact Sensation intact distally Incision - clean, dry, no drainage, healing Motor Function - intact, moving foot and toes well on exam.   Assessment/Plan: 3 Days Post-Op Procedure(s) (LRB): LEFT TOTAL KNEE ARTHROPLASTY (Left) Procedure(s) (LRB): LEFT TOTAL KNEE ARTHROPLASTY (Left) Past Medical History  Diagnosis Date  . NICM (nonischemic cardiomyopathy) 03/08/2010    likely 2/2 chemotx - a. Echo 2012: EF 45-50%;  b. Lex MV 2/12:  low risk, apical defect (small area of ischemia vs shifting breast atten);  c.  Echo 7/12: Normal wall thickness, EF 60-65%, normal wall motion, grade 1  diastolic dysfunction, mild LAE, PASP 32;   d. Lex MV 11/13:  EF 76%, no ischemia  . GERD 08/11/2008  . HYPERLIPIDEMIA 08/11/2008  . HYPERTENSION 08/11/2008  . LIPOMA 02/10/2009  . PREDIABETES 06/15/2009  . 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 Carpal tunnel syndrome of left wrist   . Neuropathy   . Interstitial cystitis   . Brain tumor   . DEPRESSION 08/11/2008  . 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 Osteoporosis   . Hyponatremia 08/28/2011  . Anemia 08/28/2011  . History of chicken pox   . H/O measles   . H/O mumps   . Dermatitis   . OA (osteoarthritis) 09/07/2011  . Melanoma 2008    DR. Ronnald Ramp  . Shortness of breath   . Asthma   . History of blood transfusion   . UTI (lower urinary tract infection) 03/03/2012  . Constipation 03/03/2012  . 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   Weight as of this encounter: 91.627 kg (202 lb).  Discharge home with home health Diet -  Cardiac diet  Follow up - in 2 weeks  Activity - WBAT  Disposition - Home  Condition Upon Discharge - Improving D/C Meds - See DC Summary  DVT Prophylaxis - Xarelto  PERKINS, ALEXZANDREW 02/13/2013, 8:26 AM

## 2013-02-14 DIAGNOSIS — I10 Essential (primary) hypertension: Secondary | ICD-10-CM | POA: Diagnosis not present

## 2013-02-14 DIAGNOSIS — IMO0001 Reserved for inherently not codable concepts without codable children: Secondary | ICD-10-CM | POA: Diagnosis not present

## 2013-02-14 DIAGNOSIS — Z471 Aftercare following joint replacement surgery: Secondary | ICD-10-CM | POA: Diagnosis not present

## 2013-02-14 DIAGNOSIS — Z96659 Presence of unspecified artificial knee joint: Secondary | ICD-10-CM | POA: Diagnosis not present

## 2013-02-14 DIAGNOSIS — G609 Hereditary and idiopathic neuropathy, unspecified: Secondary | ICD-10-CM | POA: Diagnosis not present

## 2013-02-14 DIAGNOSIS — Z7901 Long term (current) use of anticoagulants: Secondary | ICD-10-CM | POA: Diagnosis not present

## 2013-02-18 DIAGNOSIS — IMO0001 Reserved for inherently not codable concepts without codable children: Secondary | ICD-10-CM | POA: Diagnosis not present

## 2013-02-18 DIAGNOSIS — Z96659 Presence of unspecified artificial knee joint: Secondary | ICD-10-CM | POA: Diagnosis not present

## 2013-02-18 DIAGNOSIS — Z471 Aftercare following joint replacement surgery: Secondary | ICD-10-CM | POA: Diagnosis not present

## 2013-02-18 DIAGNOSIS — I10 Essential (primary) hypertension: Secondary | ICD-10-CM | POA: Diagnosis not present

## 2013-02-18 DIAGNOSIS — Z7901 Long term (current) use of anticoagulants: Secondary | ICD-10-CM | POA: Diagnosis not present

## 2013-02-18 DIAGNOSIS — G609 Hereditary and idiopathic neuropathy, unspecified: Secondary | ICD-10-CM | POA: Diagnosis not present

## 2013-02-19 DIAGNOSIS — G609 Hereditary and idiopathic neuropathy, unspecified: Secondary | ICD-10-CM | POA: Diagnosis not present

## 2013-02-19 DIAGNOSIS — Z7901 Long term (current) use of anticoagulants: Secondary | ICD-10-CM | POA: Diagnosis not present

## 2013-02-19 DIAGNOSIS — I10 Essential (primary) hypertension: Secondary | ICD-10-CM | POA: Diagnosis not present

## 2013-02-19 DIAGNOSIS — IMO0001 Reserved for inherently not codable concepts without codable children: Secondary | ICD-10-CM | POA: Diagnosis not present

## 2013-02-19 DIAGNOSIS — Z471 Aftercare following joint replacement surgery: Secondary | ICD-10-CM | POA: Diagnosis not present

## 2013-02-19 DIAGNOSIS — Z96659 Presence of unspecified artificial knee joint: Secondary | ICD-10-CM | POA: Diagnosis not present

## 2013-02-20 ENCOUNTER — Other Ambulatory Visit: Payer: Self-pay | Admitting: Family Medicine

## 2013-02-20 DIAGNOSIS — Z471 Aftercare following joint replacement surgery: Secondary | ICD-10-CM | POA: Diagnosis not present

## 2013-02-20 DIAGNOSIS — Z96659 Presence of unspecified artificial knee joint: Secondary | ICD-10-CM | POA: Diagnosis not present

## 2013-02-20 DIAGNOSIS — G609 Hereditary and idiopathic neuropathy, unspecified: Secondary | ICD-10-CM | POA: Diagnosis not present

## 2013-02-20 DIAGNOSIS — I10 Essential (primary) hypertension: Secondary | ICD-10-CM | POA: Diagnosis not present

## 2013-02-20 DIAGNOSIS — IMO0001 Reserved for inherently not codable concepts without codable children: Secondary | ICD-10-CM | POA: Diagnosis not present

## 2013-02-20 DIAGNOSIS — Z7901 Long term (current) use of anticoagulants: Secondary | ICD-10-CM | POA: Diagnosis not present

## 2013-02-21 DIAGNOSIS — I10 Essential (primary) hypertension: Secondary | ICD-10-CM | POA: Diagnosis not present

## 2013-02-21 DIAGNOSIS — Z96659 Presence of unspecified artificial knee joint: Secondary | ICD-10-CM | POA: Diagnosis not present

## 2013-02-21 DIAGNOSIS — Z7901 Long term (current) use of anticoagulants: Secondary | ICD-10-CM | POA: Diagnosis not present

## 2013-02-21 DIAGNOSIS — G609 Hereditary and idiopathic neuropathy, unspecified: Secondary | ICD-10-CM | POA: Diagnosis not present

## 2013-02-21 DIAGNOSIS — Z471 Aftercare following joint replacement surgery: Secondary | ICD-10-CM | POA: Diagnosis not present

## 2013-02-21 DIAGNOSIS — IMO0001 Reserved for inherently not codable concepts without codable children: Secondary | ICD-10-CM | POA: Diagnosis not present

## 2013-02-23 ENCOUNTER — Other Ambulatory Visit: Payer: Self-pay | Admitting: Physician Assistant

## 2013-02-24 DIAGNOSIS — Z471 Aftercare following joint replacement surgery: Secondary | ICD-10-CM | POA: Diagnosis not present

## 2013-02-24 DIAGNOSIS — Z7901 Long term (current) use of anticoagulants: Secondary | ICD-10-CM | POA: Diagnosis not present

## 2013-02-24 DIAGNOSIS — IMO0001 Reserved for inherently not codable concepts without codable children: Secondary | ICD-10-CM | POA: Diagnosis not present

## 2013-02-24 DIAGNOSIS — G609 Hereditary and idiopathic neuropathy, unspecified: Secondary | ICD-10-CM | POA: Diagnosis not present

## 2013-02-24 DIAGNOSIS — I10 Essential (primary) hypertension: Secondary | ICD-10-CM | POA: Diagnosis not present

## 2013-02-24 DIAGNOSIS — Z96659 Presence of unspecified artificial knee joint: Secondary | ICD-10-CM | POA: Diagnosis not present

## 2013-02-25 DIAGNOSIS — G609 Hereditary and idiopathic neuropathy, unspecified: Secondary | ICD-10-CM | POA: Diagnosis not present

## 2013-02-25 DIAGNOSIS — I10 Essential (primary) hypertension: Secondary | ICD-10-CM | POA: Diagnosis not present

## 2013-02-25 DIAGNOSIS — Z7901 Long term (current) use of anticoagulants: Secondary | ICD-10-CM | POA: Diagnosis not present

## 2013-02-25 DIAGNOSIS — Z96659 Presence of unspecified artificial knee joint: Secondary | ICD-10-CM | POA: Diagnosis not present

## 2013-02-25 DIAGNOSIS — IMO0001 Reserved for inherently not codable concepts without codable children: Secondary | ICD-10-CM | POA: Diagnosis not present

## 2013-02-25 DIAGNOSIS — Z471 Aftercare following joint replacement surgery: Secondary | ICD-10-CM | POA: Diagnosis not present

## 2013-02-26 DIAGNOSIS — I10 Essential (primary) hypertension: Secondary | ICD-10-CM | POA: Diagnosis not present

## 2013-02-26 DIAGNOSIS — Z471 Aftercare following joint replacement surgery: Secondary | ICD-10-CM | POA: Diagnosis not present

## 2013-02-26 DIAGNOSIS — G609 Hereditary and idiopathic neuropathy, unspecified: Secondary | ICD-10-CM | POA: Diagnosis not present

## 2013-02-26 DIAGNOSIS — IMO0001 Reserved for inherently not codable concepts without codable children: Secondary | ICD-10-CM | POA: Diagnosis not present

## 2013-02-26 DIAGNOSIS — Z96659 Presence of unspecified artificial knee joint: Secondary | ICD-10-CM | POA: Diagnosis not present

## 2013-02-26 DIAGNOSIS — Z7901 Long term (current) use of anticoagulants: Secondary | ICD-10-CM | POA: Diagnosis not present

## 2013-02-27 DIAGNOSIS — Z96659 Presence of unspecified artificial knee joint: Secondary | ICD-10-CM | POA: Diagnosis not present

## 2013-02-27 DIAGNOSIS — G609 Hereditary and idiopathic neuropathy, unspecified: Secondary | ICD-10-CM | POA: Diagnosis not present

## 2013-02-27 DIAGNOSIS — IMO0001 Reserved for inherently not codable concepts without codable children: Secondary | ICD-10-CM | POA: Diagnosis not present

## 2013-02-27 DIAGNOSIS — Z7901 Long term (current) use of anticoagulants: Secondary | ICD-10-CM | POA: Diagnosis not present

## 2013-02-27 DIAGNOSIS — Z471 Aftercare following joint replacement surgery: Secondary | ICD-10-CM | POA: Diagnosis not present

## 2013-02-27 DIAGNOSIS — I10 Essential (primary) hypertension: Secondary | ICD-10-CM | POA: Diagnosis not present

## 2013-02-28 DIAGNOSIS — Z7901 Long term (current) use of anticoagulants: Secondary | ICD-10-CM | POA: Diagnosis not present

## 2013-02-28 DIAGNOSIS — G609 Hereditary and idiopathic neuropathy, unspecified: Secondary | ICD-10-CM | POA: Diagnosis not present

## 2013-02-28 DIAGNOSIS — IMO0001 Reserved for inherently not codable concepts without codable children: Secondary | ICD-10-CM | POA: Diagnosis not present

## 2013-02-28 DIAGNOSIS — I10 Essential (primary) hypertension: Secondary | ICD-10-CM | POA: Diagnosis not present

## 2013-02-28 DIAGNOSIS — Z471 Aftercare following joint replacement surgery: Secondary | ICD-10-CM | POA: Diagnosis not present

## 2013-02-28 DIAGNOSIS — Z96659 Presence of unspecified artificial knee joint: Secondary | ICD-10-CM | POA: Diagnosis not present

## 2013-03-03 ENCOUNTER — Telehealth: Payer: Self-pay | Admitting: Family Medicine

## 2013-03-03 MED ORDER — FERROUS FUMARATE 324 MG PO TABS: 1.0000 | ORAL_TABLET | Freq: Every day | ORAL | Status: DC

## 2013-03-03 NOTE — Telephone Encounter (Signed)
refill-ferrous fumarate 324mg  tab. Take one tablet by mouth every day. Qty 30 last fill 11.29.14

## 2013-03-05 DIAGNOSIS — M1710 Unilateral primary osteoarthritis, unspecified knee: Secondary | ICD-10-CM | POA: Diagnosis not present

## 2013-03-07 DIAGNOSIS — M1710 Unilateral primary osteoarthritis, unspecified knee: Secondary | ICD-10-CM | POA: Diagnosis not present

## 2013-03-10 DIAGNOSIS — M1710 Unilateral primary osteoarthritis, unspecified knee: Secondary | ICD-10-CM | POA: Diagnosis not present

## 2013-03-12 DIAGNOSIS — M1710 Unilateral primary osteoarthritis, unspecified knee: Secondary | ICD-10-CM | POA: Diagnosis not present

## 2013-03-14 DIAGNOSIS — M1710 Unilateral primary osteoarthritis, unspecified knee: Secondary | ICD-10-CM | POA: Diagnosis not present

## 2013-03-17 DIAGNOSIS — M1710 Unilateral primary osteoarthritis, unspecified knee: Secondary | ICD-10-CM | POA: Diagnosis not present

## 2013-03-18 DIAGNOSIS — M1710 Unilateral primary osteoarthritis, unspecified knee: Secondary | ICD-10-CM | POA: Diagnosis not present

## 2013-03-18 DIAGNOSIS — Z96659 Presence of unspecified artificial knee joint: Secondary | ICD-10-CM | POA: Diagnosis not present

## 2013-03-19 DIAGNOSIS — M1710 Unilateral primary osteoarthritis, unspecified knee: Secondary | ICD-10-CM | POA: Diagnosis not present

## 2013-03-21 DIAGNOSIS — M1710 Unilateral primary osteoarthritis, unspecified knee: Secondary | ICD-10-CM | POA: Diagnosis not present

## 2013-03-24 DIAGNOSIS — M1710 Unilateral primary osteoarthritis, unspecified knee: Secondary | ICD-10-CM | POA: Diagnosis not present

## 2013-03-28 DIAGNOSIS — M1710 Unilateral primary osteoarthritis, unspecified knee: Secondary | ICD-10-CM | POA: Diagnosis not present

## 2013-04-01 DIAGNOSIS — M1710 Unilateral primary osteoarthritis, unspecified knee: Secondary | ICD-10-CM | POA: Diagnosis not present

## 2013-04-04 DIAGNOSIS — M1710 Unilateral primary osteoarthritis, unspecified knee: Secondary | ICD-10-CM | POA: Diagnosis not present

## 2013-04-22 ENCOUNTER — Telehealth: Payer: Self-pay | Admitting: Family Medicine

## 2013-04-22 MED ORDER — TELMISARTAN 80 MG PO TABS: 40.0000 mg | ORAL_TABLET | Freq: Every day | ORAL | Status: DC

## 2013-04-22 MED ORDER — ROSUVASTATIN CALCIUM 20 MG PO TABS: 20.0000 mg | ORAL_TABLET | Freq: Every day | ORAL | Status: DC

## 2013-04-22 NOTE — Telephone Encounter (Signed)
Refill-crestor  Refill-telmisartan

## 2013-04-23 DIAGNOSIS — N3010 Interstitial cystitis (chronic) without hematuria: Secondary | ICD-10-CM | POA: Diagnosis not present

## 2013-05-13 DIAGNOSIS — H2510 Age-related nuclear cataract, unspecified eye: Secondary | ICD-10-CM | POA: Diagnosis not present

## 2013-05-21 ENCOUNTER — Telehealth: Payer: Self-pay | Admitting: Family Medicine

## 2013-05-21 NOTE — Telephone Encounter (Signed)
Refill folic acid 

## 2013-05-22 MED ORDER — FOLIC ACID 1 MG PO TABS: ORAL_TABLET | ORAL | Status: DC

## 2013-05-22 NOTE — Telephone Encounter (Signed)
Please advise refill request

## 2013-05-22 NOTE — Telephone Encounter (Signed)
OK to refill Folic Acid for 6 months.

## 2013-06-19 ENCOUNTER — Telehealth: Payer: Self-pay | Admitting: Family Medicine

## 2013-06-19 NOTE — Telephone Encounter (Signed)
Please advise refill request. Last ov 10/18/2012

## 2013-06-19 NOTE — Telephone Encounter (Signed)
Refill venlafaxine hcl er 150 mg cap take 1 capsule by mouth every day qty 30  [facility]

## 2013-06-20 MED ORDER — VENLAFAXINE HCL ER 150 MG PO CP24: ORAL_CAPSULE | ORAL | Status: DC

## 2013-06-20 NOTE — Telephone Encounter (Signed)
Rx sent to pharmacy   

## 2013-06-20 NOTE — Telephone Encounter (Signed)
Ok to [facility] same sig, same strength, disp #30 with 3rf

## 2013-07-07 ENCOUNTER — Other Ambulatory Visit: Payer: Self-pay | Admitting: Physician Assistant

## 2013-07-07 DIAGNOSIS — Z853 Personal history of malignant neoplasm of breast: Secondary | ICD-10-CM

## 2013-07-21 ENCOUNTER — Other Ambulatory Visit: Payer: Self-pay | Admitting: *Deleted

## 2013-07-21 MED ORDER — CLONAZEPAM 1 MG PO TABS: 0.5000 mg | ORAL_TABLET | Freq: Every day | ORAL | Status: DC

## 2013-07-21 NOTE — Telephone Encounter (Signed)
Faxed refill request received from [facility] for Clonazepam 1 mg Last filled by MD on 11.29.14, #[id number] Last AEX - 09.19.14 Next AEX - Not stated Please Advise on refills/SLS

## 2013-07-21 NOTE — Telephone Encounter (Signed)
Rx request faxed to pharmacy/SLS  

## 2013-07-28 DIAGNOSIS — N390 Urinary tract infection, site not specified: Secondary | ICD-10-CM | POA: Diagnosis not present

## 2013-08-08 ENCOUNTER — Other Ambulatory Visit: Payer: Self-pay | Admitting: Family Medicine

## 2013-08-15 ENCOUNTER — Ambulatory Visit
Admission: RE | Admit: 2013-08-15 | Discharge: 2013-08-15 | Disposition: A | Discharge status: Home / Self Care | Payer: Medicare Other | Source: Ambulatory Visit | Attending: Physician Assistant | Admitting: Physician Assistant

## 2013-08-15 DIAGNOSIS — Z853 Personal history of malignant neoplasm of breast: Secondary | ICD-10-CM | POA: Diagnosis not present

## 2013-08-15 DIAGNOSIS — R928 Other abnormal and inconclusive findings on diagnostic imaging of breast: Secondary | ICD-10-CM | POA: Diagnosis not present

## 2013-08-26 DIAGNOSIS — Z96659 Presence of unspecified artificial knee joint: Secondary | ICD-10-CM | POA: Diagnosis not present

## 2013-08-26 DIAGNOSIS — Z471 Aftercare following joint replacement surgery: Secondary | ICD-10-CM | POA: Diagnosis not present

## 2013-09-05 ENCOUNTER — Other Ambulatory Visit: Payer: Self-pay

## 2013-09-05 MED ORDER — TELMISARTAN 80 MG PO TABS: 40.0000 mg | ORAL_TABLET | Freq: Every day | ORAL | Status: DC

## 2013-09-08 ENCOUNTER — Telehealth: Payer: Self-pay

## 2013-09-08 MED ORDER — TELMISARTAN 80 MG PO TABS: 40.0000 mg | ORAL_TABLET | Freq: Every day | ORAL | Status: DC

## 2013-09-08 MED ORDER — TELMISARTAN 80 MG PO TABS: 80.0000 mg | ORAL_TABLET | Freq: Every day | ORAL | Status: DC

## 2013-09-08 NOTE — Telephone Encounter (Signed)
I am OK with Micardis 80 mg tab po daily. It always was that til this last Rx, disp #30 with 5 rf or #90 with 1 rf

## 2013-09-08 NOTE — Telephone Encounter (Signed)
Per patient she is requesting that Micardis is 80mg  daily?  Please advise?

## 2013-09-19 ENCOUNTER — Other Ambulatory Visit: Payer: Self-pay | Admitting: Family Medicine

## 2013-09-22 NOTE — Telephone Encounter (Signed)
Rx printed. Please remind pt to keep up coming appt. Thank you. LDM

## 2013-10-03 ENCOUNTER — Other Ambulatory Visit: Payer: Self-pay | Admitting: *Deleted

## 2013-10-03 MED ORDER — CARVEDILOL 6.25 MG PO TABS: ORAL_TABLET | ORAL | Status: DC

## 2013-10-10 ENCOUNTER — Encounter: Payer: Self-pay | Admitting: Family Medicine

## 2013-10-10 ENCOUNTER — Ambulatory Visit (INDEPENDENT_AMBULATORY_CARE_PROVIDER_SITE_OTHER): Payer: Medicare Other | Admitting: Family Medicine

## 2013-10-10 VITALS — BP 135/92 | HR 80 | Temp 98.4°F | Ht 63.0 in | Wt 207.2 lb

## 2013-10-10 DIAGNOSIS — H109 Unspecified conjunctivitis: Secondary | ICD-10-CM | POA: Diagnosis not present

## 2013-10-10 DIAGNOSIS — K2100 Gastro-esophageal reflux disease with esophagitis, without bleeding: Secondary | ICD-10-CM

## 2013-10-10 DIAGNOSIS — E785 Hyperlipidemia, unspecified: Secondary | ICD-10-CM | POA: Diagnosis not present

## 2013-10-10 DIAGNOSIS — R7309 Other abnormal glucose: Secondary | ICD-10-CM

## 2013-10-10 DIAGNOSIS — I10 Essential (primary) hypertension: Secondary | ICD-10-CM

## 2013-10-10 DIAGNOSIS — D649 Anemia, unspecified: Secondary | ICD-10-CM

## 2013-10-10 DIAGNOSIS — R739 Hyperglycemia, unspecified: Secondary | ICD-10-CM

## 2013-10-10 DIAGNOSIS — R0609 Other forms of dyspnea: Secondary | ICD-10-CM

## 2013-10-10 DIAGNOSIS — R0989 Other specified symptoms and signs involving the circulatory and respiratory systems: Secondary | ICD-10-CM

## 2013-10-10 DIAGNOSIS — Z23 Encounter for immunization: Secondary | ICD-10-CM | POA: Diagnosis not present

## 2013-10-10 LAB — RENAL FUNCTION PANEL
ALBUMIN: 4.1 g/dL (ref 3.5–5.2)
BUN: 14 mg/dL (ref 6–23)
CALCIUM: 9.5 mg/dL (ref 8.4–10.5)
CHLORIDE: 105 meq/L (ref 96–112)
CO2: 26 meq/L (ref 19–32)
CREATININE: 0.9 mg/dL (ref 0.4–1.2)
GFR: 65.92 mL/min (ref >60.00)
GLUCOSE: 92 mg/dL (ref 70–99)
POTASSIUM: 4.1 meq/L (ref 3.5–5.1)
Phosphorus: 3.5 mg/dL (ref 2.3–4.6)
Sodium: 141 mEq/L (ref 135–145)

## 2013-10-10 LAB — LIPID PANEL
CHOLESTEROL: 201 mg/dL — AB (ref 0–200)
HDL: 41.9 mg/dL (ref >39.00)
LDL Cholesterol: 120 mg/dL — ABNORMAL HIGH (ref 0–99)
NONHDL: 159.1
Total CHOL/HDL Ratio: 5
Triglycerides: 195 mg/dL — ABNORMAL HIGH (ref 0.0–149.0)
VLDL: 39 mg/dL (ref 0.0–40.0)

## 2013-10-10 LAB — HEPATIC FUNCTION PANEL
ALBUMIN: 4.1 g/dL (ref 3.5–5.2)
ALT: 25 U/L (ref 0–35)
AST: 32 U/L (ref 0–37)
Alkaline Phosphatase: 103 U/L (ref 39–117)
Bilirubin, Direct: 0 mg/dL (ref 0.0–0.3)
Total Bilirubin: 0.3 mg/dL (ref 0.2–1.2)
Total Protein: 7.1 g/dL (ref 6.0–8.3)

## 2013-10-10 LAB — HEMOGLOBIN A1C: HEMOGLOBIN A1C: 6.1 % (ref 4.6–6.5)

## 2013-10-10 LAB — TSH: TSH: 1.88 u[IU]/mL (ref 0.35–4.50)

## 2013-10-10 MED ORDER — POLYMYXIN B-TRIMETHOPRIM 10000-0.1 UNIT/ML-% OP SOLN: 2.0000 [drp] | Freq: Three times a day (TID) | OPHTHALMIC | Status: DC

## 2013-10-10 NOTE — Patient Instructions (Signed)
Try Mylanta and Clonazepam if discomfort occurs again, if no relief to ER   Esophagitis Esophagitis is inflammation of the esophagus. It can involve swelling, soreness, and pain in the esophagus. This condition can make it difficult and painful to swallow. CAUSES  Most causes of esophagitis are not serious. Many different factors can cause esophagitis, including:  Gastroesophageal reflux disease (GERD). This is when acid from your stomach flows up into the esophagus.  Recurrent vomiting.  An allergic-type reaction.  Certain medicines, especially those that come in large pills.  Ingestion of harmful chemicals, such as household cleaning products.  Heavy alcohol use.  An infection of the esophagus.  Radiation treatment for cancer.  Certain diseases such as sarcoidosis, Crohn's disease, and scleroderma. These diseases may cause recurrent esophagitis. SYMPTOMS   Trouble swallowing.  Painful swallowing.  Chest pain.  Difficulty breathing.  Nausea.  Vomiting.  Abdominal pain. DIAGNOSIS  Your caregiver will take your history and do a physical exam. Depending upon what your caregiver finds, certain tests may also be done, including:  Barium X-ray. You will drink a solution that coats the esophagus, and X-rays will be taken.  Endoscopy. A lighted tube is put down the esophagus so your caregiver can examine the area.  Allergy tests. These can sometimes be arranged through follow-up visits. TREATMENT  Treatment will depend on the cause of your esophagitis. In some cases, steroids or other medicines may be given to help relieve your symptoms or to treat the underlying cause of your condition. Medicines that may be recommended include:  Viscous lidocaine, to soothe the esophagus.  Antacids.  Acid reducers.  Proton pump inhibitors.  Antiviral medicines for certain viral infections of the esophagus.  Antifungal medicines for certain fungal infections of the  esophagus.  Antibiotic medicines, depending on the cause of the esophagitis. HOME CARE INSTRUCTIONS   Avoid foods and drinks that seem to make your symptoms worse.  Eat small, frequent meals instead of large meals.  Avoid eating for the 3 hours prior to your bedtime.  If you have trouble taking pills, use a pill splitter to decrease the size and likelihood of the pill getting stuck or injuring the esophagus on the way down. Drinking water after taking a pill also helps.  Stop smoking if you smoke.  Maintain a healthy weight.  Wear loose-fitting clothing. Do not wear anything tight around your waist that causes pressure on your stomach.  Raise the head of your bed 6 to 8 inches with wood blocks to help you sleep. Extra pillows will not help.  Only take over-the-counter or prescription medicines as directed by your caregiver. SEEK IMMEDIATE MEDICAL CARE IF:  You have severe chest pain that radiates into your arm, neck, or jaw.  You feel sweaty, dizzy, or lightheaded.  You have shortness of breath.  You vomit blood.  You have difficulty or pain with swallowing.  You have bloody or black, tarry stools.  You have a fever.  You have a burning sensation in the chest more than 3 times a week for more than 2 weeks.  You cannot swallow, drink, or eat.  You drool because you cannot swallow your saliva. MAKE SURE YOU:  Understand these instructions.  Will watch your condition.  Will get help right away if you are not doing well or get worse. Document Released: 02/24/2004 Document Revised: 04/10/2011 Document Reviewed: 09/16/2010 [facility] Patient Information 2015 [place], [place]. This information is not intended to replace advice given to you by your health  care provider. Make sure you discuss any questions you have with your health care provider.

## 2013-10-10 NOTE — Progress Notes (Signed)
Pre visit review using our clinic review tool, if applicable. No additional management support is needed unless otherwise documented below in the visit note. 

## 2013-10-13 ENCOUNTER — Encounter: Payer: Self-pay | Admitting: Family Medicine

## 2013-10-13 DIAGNOSIS — H109 Unspecified conjunctivitis: Secondary | ICD-10-CM | POA: Insufficient documentation

## 2013-10-13 NOTE — Assessment & Plan Note (Signed)
Warm compresses and polytrim call if worsens

## 2013-10-13 NOTE — Assessment & Plan Note (Signed)
resolved 

## 2013-10-13 NOTE — Assessment & Plan Note (Signed)
Well controlled, no changes to meds. Encouraged heart healthy diet such as the DASH diet and exercise as tolerated.  °

## 2013-10-13 NOTE — Assessment & Plan Note (Signed)
Avoid offending foods, start probiotics. Do not eat large meals in late evening and consider raising head of bed. Awakens with Chest Pain at times encouraged to try Carafate or Mylanta when this happens report if worsening

## 2013-10-13 NOTE — Assessment & Plan Note (Signed)
Glucose wnl today

## 2013-10-13 NOTE — Assessment & Plan Note (Signed)
Patient notes this is persistent, encouraged activity as tolerated and may need sooner cardiac and or pulmonary referrals if symptoms worsen

## 2013-10-13 NOTE — Progress Notes (Signed)
Patient ID: Shannon [name], female   DOB: 06-Nov-1945, 68 y.o.   MRN: [id number] HIILANI JETTER [id number] 1945-04-29 10/13/2013      Progress Note-Follow Up  Subjective  Chief Complaint  Chief Complaint  Patient presents with  . Follow-up    1 year    HPI  Patient is a 68 year old female in today for routine medical care. Patient is in today with a few concerns. One is several weeks now matting in her left eye. No itching but some mild discomfort noted at times. No changes in vision. No congestion or fever. She has had both knees worked on him while they are improved she does struggle with daily pain. She's had no recent illness she does note worsening shortness of breath with exertion but this has no associated symptoms. She has episodes of waking up at night with chest pain but notes this is generally when she's stressed and sometimes related to heartburn. No other GI or GU complaints. Taking medications as prescribed   Past Medical History  Diagnosis Date  . NICM (nonischemic cardiomyopathy) 03/08/2010    likely 2/2 chemotx - a. Echo 2012: EF 45-50%;  b. Lex MV 2/12:  low risk, apical defect (small area of ischemia vs shifting breast atten);  c.  Echo 7/12: Normal wall thickness, EF 60-65%, normal wall motion, grade 1 diastolic dysfunction, mild LAE, PASP 32;   d. Lex MV 11/13:  EF 76%, no ischemia  . GERD 08/11/2008  . HYPERLIPIDEMIA 08/11/2008  . HYPERTENSION 08/11/2008  . LIPOMA 02/10/2009  . PREDIABETES 06/15/2009  . 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 Carpal tunnel syndrome of left wrist   . Neuropathy   . Interstitial cystitis   . Brain tumor   . DEPRESSION 08/11/2008  . 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 Osteoporosis   . Hyponatremia 08/28/2011  . Anemia 08/28/2011  . History of chicken pox   . H/O measles   . H/O mumps   . Dermatitis   . OA (osteoarthritis) 09/07/2011  . Melanoma 2008    DR.  Ronnald Ramp  . Shortness of breath   . Asthma   . History of blood transfusion   . UTI (lower urinary tract infection) 03/03/2012  . Constipation 03/03/2012  . 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 Cholecystectomy    . Carpal tunnel release      L wrist, ulnar nerve moved  . Elbow surgery      left  . Tubal ligation  1997  . Lipoma excision  03/28/2009    right leg  . Portacath placement  may 2011  . Craniotomy for tumor  2000  . Tonsillectomy  1958  . Melanoma excision    . [place] cath    . Port-a-cath removal  11/30/2010    [place]  . Breast lumpectomy  04/2009    RIGHT FOR BREAST CANCER-CHEMO/RADIATION X 1 YEAR  . Total knee raplacement  01-2012    Right Knee  . Total knee arthroplasty  02/05/2012    Procedure: TOTAL KNEE ARTHROPLASTY;  Surgeon: Gearlean Alf, MD;  Location: [facility];  Service: Orthopedics;  Laterality: Right;  . Total knee arthroplasty Left 02/10/2013    Procedure: LEFT TOTAL KNEE ARTHROPLASTY;  Surgeon: Gearlean Alf, MD;  Location: [facility];  Service: Orthopedics;  Laterality: Left;    Family History  Problem Relation Age of Onset  . Breast cancer Mother   . Lung cancer Mother   . Breast cancer Mother     sarcoma  . Hypertension Mother   . Colon cancer Neg Hx   . Prostate cancer Father   . Congestive Heart Failure Father   . Prostate cancer Father   . Prostate cancer Brother   . Prostate cancer Brother   . Stomach cancer Maternal Aunt   . Uterine cancer Maternal Aunt   . Down syndrome Son   . Heart disease Maternal Grandfather     MI  . Heart attack Father     History   Social History  . Marital Status: Married    Spouse Name: N/A    Number of Children: 3  . Years of Education: N/A   Occupational History  . boutique owner    Social History Main Topics  . Smoking status: Never Smoker   . Smokeless tobacco: Never Used  . Alcohol Use: No  . Drug Use: No  . Sexual Activity: No   Other  Topics Concern  . Not on file   Social History Narrative  . No narrative on file    Current Outpatient Prescriptions on File Prior to Visit  Medication Sig Dispense Refill  . carvedilol (COREG) 6.25 MG tablet TAKE 1 TABLET (6.25 MG TOTAL) BY MOUTH 2 (TWO) TIMES DAILY WITH A MEAL.  60 tablet  2  . clonazePAM (KLONOPIN) 1 MG tablet TAKE 1/2 TABLET BY MOUTH AT BEDTIME  30 tablet  0  . Ferrous Fumarate 324 MG TABS Take 1 tablet (106 mg of iron total) by mouth daily.  30 each  4  . folic acid (FOLVITE) 1 MG tablet TAKE 1 TABLET BY MOUTH EVERY DAY  30 tablet  3  . gabapentin (NEURONTIN) 300 MG capsule Take 300 mg by mouth 2 (two) times daily.       . hydrOXYzine (ATARAX/VISTARIL) 25 MG tablet Take 25 mg by mouth at bedtime.      Marland Kitchen nystatin (MYCOSTATIN) powder Apply topically 4 (four) times daily as needed.      . pentosan polysulfate (ELMIRON) 100 MG capsule Take 200 mg by mouth 2 (two) times daily.      . psyllium (METAMUCIL SMOOTH TEXTURE) 28 % packet Take 1 packet by mouth daily as needed (constipation).      . ranitidine (ZANTAC) 150 MG tablet Take 150 mg by mouth 2 (two) times daily.      . rosuvastatin (CRESTOR) 20 MG tablet Take 1 tablet (20 mg total) by mouth at bedtime.  30 tablet  5  . sucralfate (CARAFATE) 1 G tablet Take 1 g by mouth daily as needed (acid reflex).       Marland Kitchen telmisartan (MICARDIS) 80 MG tablet Take 1 tablet (80 mg total) by mouth daily.  30 tablet  1  . venlafaxine XR (EFFEXOR-XR) 150 MG 24 hr capsule TAKE ONE CAPSULE BY MOUTH EVERY DAY  30 capsule  3   No current facility-administered medications on file prior to visit.    Allergies  Allergen Reactions  . Dilaudid [Hydromorphone Hcl] Itching    Review of Systems  Review of Systems  Constitutional: Negative for fever and malaise/fatigue.  HENT: Negative for congestion.   Eyes: Positive for discharge. Negative for blurred vision, double vision, photophobia, pain and redness.  Respiratory: Negative for  shortness of breath.   Cardiovascular: Negative for chest pain,  palpitations and leg swelling.  Gastrointestinal: Negative for nausea, abdominal pain and diarrhea.  Genitourinary: Negative for dysuria.  Musculoskeletal: Negative for falls.  Skin: Negative for rash.  Neurological: Negative for loss of consciousness and headaches.  Endo/Heme/Allergies: Negative for polydipsia.  Psychiatric/Behavioral: Negative for depression and suicidal ideas. The patient is not nervous/anxious and does not have insomnia.     Objective  BP 135/92  Pulse 80  Temp(Src) 98.4 F (36.9 C) (Oral)  Ht 5' 3" (1.6 m)  Wt 207 lb 3.2 oz (93.985 kg)  BMI 36.71 kg/m2  SpO2 97%  LMP 01/30/1994  Physical Exam  Physical Exam  Constitutional: She is oriented to person, place, and time and well-developed, well-nourished, and in no distress. No distress.  HENT:  Head: Normocephalic and atraumatic.  Eyes: Conjunctivae are normal.  Neck: Neck supple. No thyromegaly present.  Cardiovascular: Normal rate, regular rhythm and normal heart sounds.   No murmur heard. Pulmonary/Chest: Effort normal and breath sounds normal. She has no wheezes.  Abdominal: She exhibits no distension and no mass.  Musculoskeletal: She exhibits no edema.  Lymphadenopathy:    She has no cervical adenopathy.  Neurological: She is alert and oriented to person, place, and time.  Skin: Skin is warm and dry. No rash noted. She is not diaphoretic.  Psychiatric: Memory, affect and judgment normal.    Lab Results  Component Value Date   TSH 1.88 10/10/2013   Lab Results  Component Value Date   WBC 9.8 02/13/2013   HGB 8.6* 02/13/2013   HCT 27.1* 02/13/2013   MCV 90.6 02/13/2013   PLT 204 02/13/2013   Lab Results  Component Value Date   CREATININE 0.9 10/10/2013   BUN 14 10/10/2013   NA 141 10/10/2013   K 4.1 10/10/2013   CL 105 10/10/2013   CO2 26 10/10/2013   Lab Results  Component Value Date   ALT 25 10/10/2013   AST 32 10/10/2013    ALKPHOS 103 10/10/2013   BILITOT 0.3 10/10/2013   Lab Results  Component Value Date   CHOL 201* 10/10/2013   Lab Results  Component Value Date   HDL 41.90 10/10/2013   Lab Results  Component Value Date   LDLCALC 120* 10/10/2013   Lab Results  Component Value Date   TRIG 195.0* 10/10/2013   Lab Results  Component Value Date   CHOLHDL 5 10/10/2013     Assessment & Plan  HYPERTENSION Well controlled, no changes to meds. Encouraged heart healthy diet such as the DASH diet and exercise as tolerated.   Postop Anemia resolved  PREDIABETES Glucose wnl today  DYSPNEA ON EXERTION Patient notes this is persistent, encouraged activity as tolerated and may need sooner cardiac and or pulmonary referrals if symptoms worsen  Gastro-esophageal reflux Avoid offending foods, start probiotics. Do not eat large meals in late evening and consider raising head of bed. Awakens with Chest Pain at times encouraged to try Carafate or Mylanta when this happens report if worsening   Conjunctivitis of left eye Warm compresses and polytrim call if worsens

## 2013-10-16 ENCOUNTER — Other Ambulatory Visit: Payer: Self-pay | Admitting: Family Medicine

## 2013-10-20 ENCOUNTER — Telehealth: Payer: Self-pay | Admitting: Nurse Practitioner

## 2013-10-20 NOTE — Telephone Encounter (Signed)
pt cld to r/s appt gave pt new time & date

## 2013-10-21 ENCOUNTER — Ambulatory Visit: Payer: Medicare Other | Admitting: Nurse Practitioner

## 2013-10-21 ENCOUNTER — Other Ambulatory Visit: Payer: Medicare Other

## 2013-10-21 DIAGNOSIS — D219 Benign neoplasm of connective and other soft tissue, unspecified: Secondary | ICD-10-CM | POA: Diagnosis not present

## 2013-10-21 DIAGNOSIS — L821 Other seborrheic keratosis: Secondary | ICD-10-CM | POA: Diagnosis not present

## 2013-10-21 DIAGNOSIS — L565 Disseminated superficial actinic porokeratosis (DSAP): Secondary | ICD-10-CM | POA: Diagnosis not present

## 2013-10-21 DIAGNOSIS — Z85820 Personal history of malignant melanoma of skin: Secondary | ICD-10-CM | POA: Diagnosis not present

## 2013-10-22 ENCOUNTER — Encounter: Payer: Self-pay | Admitting: Gastroenterology

## 2013-10-24 ENCOUNTER — Other Ambulatory Visit: Payer: Self-pay | Admitting: *Deleted

## 2013-10-24 DIAGNOSIS — C50311 Malignant neoplasm of lower-inner quadrant of right female breast: Secondary | ICD-10-CM

## 2013-10-27 ENCOUNTER — Telehealth: Payer: Self-pay | Admitting: Nurse Practitioner

## 2013-10-27 ENCOUNTER — Other Ambulatory Visit (HOSPITAL_BASED_OUTPATIENT_CLINIC_OR_DEPARTMENT_OTHER): Payer: Medicare Other

## 2013-10-27 ENCOUNTER — Encounter: Payer: Self-pay | Admitting: Nurse Practitioner

## 2013-10-27 ENCOUNTER — Ambulatory Visit (HOSPITAL_BASED_OUTPATIENT_CLINIC_OR_DEPARTMENT_OTHER): Payer: Medicare Other | Admitting: Nurse Practitioner

## 2013-10-27 VITALS — BP 141/75 | HR 78 | Temp 98.1°F | Resp 18 | Ht 63.0 in | Wt 209.8 lb

## 2013-10-27 DIAGNOSIS — C50319 Malignant neoplasm of lower-inner quadrant of unspecified female breast: Secondary | ICD-10-CM

## 2013-10-27 DIAGNOSIS — F411 Generalized anxiety disorder: Secondary | ICD-10-CM

## 2013-10-27 DIAGNOSIS — F419 Anxiety disorder, unspecified: Secondary | ICD-10-CM

## 2013-10-27 DIAGNOSIS — R61 Generalized hyperhidrosis: Secondary | ICD-10-CM

## 2013-10-27 DIAGNOSIS — Z171 Estrogen receptor negative status [ER-]: Secondary | ICD-10-CM | POA: Diagnosis not present

## 2013-10-27 DIAGNOSIS — C50311 Malignant neoplasm of lower-inner quadrant of right female breast: Secondary | ICD-10-CM

## 2013-10-27 DIAGNOSIS — G4700 Insomnia, unspecified: Secondary | ICD-10-CM

## 2013-10-27 LAB — COMPREHENSIVE METABOLIC PANEL (CC13)
ALBUMIN: 3.8 g/dL (ref 3.5–5.0)
ALT: 23 U/L (ref 0–55)
AST: 26 U/L (ref 5–34)
Alkaline Phosphatase: 117 U/L (ref 40–150)
Anion Gap: 9 mEq/L (ref 3–11)
BUN: 13.8 mg/dL (ref 7.0–26.0)
CHLORIDE: 105 meq/L (ref 98–109)
CO2: 29 mEq/L (ref 22–29)
Calcium: 9.6 mg/dL (ref 8.4–10.4)
Creatinine: 1 mg/dL (ref 0.6–1.1)
GLUCOSE: 94 mg/dL (ref 70–140)
POTASSIUM: 4.4 meq/L (ref 3.5–5.1)
Sodium: 143 mEq/L (ref 136–145)
Total Bilirubin: 0.36 mg/dL (ref 0.20–1.20)
Total Protein: 7 g/dL (ref 6.4–8.3)

## 2013-10-27 LAB — CBC WITH DIFFERENTIAL/PLATELET
BASO%: 0.8 % (ref 0.0–2.0)
Basophils Absolute: 0 10*3/uL (ref 0.0–0.1)
EOS ABS: 0.3 10*3/uL (ref 0.0–0.5)
EOS%: 4.8 % (ref 0.0–7.0)
HCT: 35.7 % (ref 34.8–46.6)
HEMOGLOBIN: 11.5 g/dL — AB (ref 11.6–15.9)
LYMPH#: 1.8 10*3/uL (ref 0.9–3.3)
LYMPH%: 30 % (ref 14.0–49.7)
MCH: 28.4 pg (ref 25.1–34.0)
MCHC: 32.1 g/dL (ref 31.5–36.0)
MCV: 88.7 fL (ref 79.5–101.0)
MONO#: 0.6 10*3/uL (ref 0.1–0.9)
MONO%: 9.8 % (ref 0.0–14.0)
NEUT%: 54.6 % (ref 38.4–76.8)
NEUTROS ABS: 3.3 10*3/uL (ref 1.5–6.5)
Platelets: 263 10*3/uL (ref 145–400)
RBC: 4.03 10*6/uL (ref 3.70–5.45)
RDW: 14 % (ref 11.2–14.5)
WBC: 6 10*3/uL (ref 3.9–10.3)

## 2013-10-27 NOTE — Progress Notes (Signed)
ID: Shannon [name]   DOB: Feb 02, 1945  MR#: [id number]  CSN#:[id number]  PCP: Penni Homans, MD GYN: SU:  OTHER MD: Gaynelle Arabian  CHIEF COMPLAINT: right breast cancer CURRENT TREATMENT: none, observation   BREAST CANCER HISTORY: Shannon [name] had a negative screening mammogram in July of 2010.  About a month ago though she felt a change in the lower aspect of the right breast and brought this to Dr. Dickie La attention.  He set her up for a diagnostic mammography on April 2011 and Dr. Mariea Clonts found an interval mass with ill defined margins, deep in the lower, inner right breast.  This was palpable at the 3:30 position, approximately 9 cm from the nipple.  There were no palpable right axillary lymph nodes and by ultrasound there were no abnormal lymph nodes in the right axilla.  The ultrasound did show a 1.7 cm, irregular hypoechoic mass, just beneath the skin.  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  The tumor cells were estrogen receptor and progesterone receptor negative, both at 0%.  The MIB-1 was elevated at 57% and the HER2/neu ratio by CISH was positive at 2.74. Her subsequent history is as detailed below  INTERVAL HISTORY: Shannon [name] returns today for follow up of her breast cancer. The interval history is unremarkable. She continues to take care of her disabled son who originally had a high function down syndrome, but is now batting dementia. He is followed by Dr. Lavone Neri at [facility] and they are happy with his care. She is riddled with anxiety because of not being able to complete her trasuzumab course because of worsening ejection fractions. She doesn't want a recurrence to 'creep up on her her' and she is asking for restaging scans to be performed.   REVIEW OF SYSTEMS: Shannon [name] denies fevers, chills, nausea, vomiting, or changes in bowel or bladder habits. She has no  shortness of breath, chest pain, cough, or palpitations. She does not sleep well at night because of her anxiety. She is on klonopin QHS to help her sleep. She also has gabapentin on board for her nightly hot flashes. Her bilateral knees are bothersome, but status post bilateral replacements. She is not exercising as much as she knows she should, but now that the weather is more pleasant she is going to get outside more with her son. A detailed review of systems is otherwise noncontributory.   PAST MEDICAL HISTORY: Past Medical History  Diagnosis Date  . NICM (nonischemic cardiomyopathy) 03/08/2010    likely 2/2 chemotx - a. Echo 2012: EF 45-50%;  b. Lex MV 2/12:  low risk, apical defect (small area of ischemia vs shifting breast atten);  c.  Echo 7/12: Normal wall thickness, EF 60-65%, normal wall motion, grade 1 diastolic dysfunction, mild LAE, PASP 32;   d. Lex MV 11/13:  EF 76%, no ischemia  . GERD 08/11/2008  . HYPERLIPIDEMIA 08/11/2008  . HYPERTENSION 08/11/2008  . LIPOMA 02/10/2009  . PREDIABETES 06/15/2009  . 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 Carpal tunnel syndrome of left wrist   . Neuropathy   . Interstitial cystitis   . Brain tumor   . DEPRESSION 08/11/2008  . 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 Osteoporosis   . Hyponatremia 08/28/2011  . Anemia 08/28/2011  .  History of chicken pox   . H/O measles   . H/O mumps   . Dermatitis   . OA (osteoarthritis) 09/07/2011  . Melanoma 2008    DR. Ronnald Ramp  . Shortness of breath   . Asthma   . History of blood transfusion   . UTI (lower urinary tract infection) 03/03/2012  . Constipation 03/03/2012  . 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 Allyn Kenner.  We will try to get that report.  She also had a craniopharyngioma removed by Dr. Jovita Gamma about 11 years ago.   She required no radiation for this (she presented with headaches and amaurosis of the right eye).  She has just undergone carpal tunnel release on the left by Dr. Theodis Sato and is recovering well from that.  She had a lipoma removed from her right thigh by Dr. Margot Chimes.  She has a history of cholecystectomy, a history of prior breast cyst removal, a history of interstitial cystitis, followed by Dr. Amalia Hailey, history of hypertension, history of hypercholesterolemia, history of mild GERD/HH, history of childhood asthma, resolved, history of osteoarthritis involving both knees followed by Dr. Gaynelle Arabian and history of osteopenias noted on bone density obtained 2009.  PAST SURGICAL HISTORY: Past Surgical History  Procedure Laterality Date  . Cholecystectomy    . Carpal tunnel release      L wrist, ulnar nerve moved  . Elbow surgery      left  . Tubal ligation  1997  . Lipoma excision  03/28/2009    right leg  . Portacath placement  may 2011  . Craniotomy for tumor  2000  . Tonsillectomy  1958  . Melanoma excision    . [place] cath    . Port-a-cath removal  11/30/2010    Streck  . Breast lumpectomy  04/2009    RIGHT FOR BREAST CANCER-CHEMO/RADIATION X 1 YEAR  . Total knee raplacement  01-2012    Right Knee  . Total knee arthroplasty  02/05/2012    Procedure: TOTAL KNEE ARTHROPLASTY;  Surgeon: Gearlean Alf, MD;  Location: [facility];  Service: Orthopedics;  Laterality: Right;  . Total knee arthroplasty Left 02/10/2013    Procedure: LEFT TOTAL KNEE ARTHROPLASTY;  Surgeon: Gearlean Alf, MD;  Location: [facility];  Service: Orthopedics;  Laterality: Left;    FAMILY HISTORY Family History  Problem Relation Age of Onset  . Breast cancer Mother   . Lung cancer Mother   . Breast cancer Mother     sarcoma  . Hypertension Mother   . Colon cancer Neg Hx   . Prostate cancer Father   . Congestive Heart Failure Father   . Prostate cancer Father   . Prostate cancer Brother   . Prostate cancer  Brother   . Stomach cancer Maternal Aunt   . Uterine cancer Maternal Aunt   . Down syndrome Son   . Heart disease Maternal Grandfather     MI  . Heart attack Father   The patient's father is alive at age 66.  He lives in a residential home, but the patient consider herself as his primary caregiver.  The patient's mother had breast cancer diagnosed at the age of 71.  She developed lower extremity sarcoma 10 years later, which eventually took her life.  The patient has one brother and one sister both in good health.  GYNECOLOGIC HISTORY: She is P3, first pregnancy  to term age 75.  Change of life around age 32.  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  Her husband Deidre Ala is an Physicist, medical.  Daughter Adonis Brook is a Marine scientist at [employer] in [place].  Son Leroy Sea  works as an Chief Financial Officer, actually not with his dad.  The third child is Sherren Mocha, lives at home, has Down's syndrome.  The patient attends [employer].   ADVANCED DIRECTIVES: In place  HEALTH MAINTENANCE: History  Substance Use Topics  . Smoking status: Never Smoker   . Smokeless tobacco: Never Used  . Alcohol Use: No     Colonoscopy:  PAP:  Bone density:  Lipid panel:  Allergies  Allergen Reactions  . Dilaudid [Hydromorphone Hcl] Itching    Current Outpatient Prescriptions  Medication Sig Dispense Refill  . carvedilol (COREG) 6.25 MG tablet TAKE 1 TABLET (6.25 MG TOTAL) BY MOUTH 2 (TWO) TIMES DAILY WITH A MEAL.  60 tablet  2  . clonazePAM (KLONOPIN) 1 MG tablet TAKE 1/2 TABLET BY MOUTH AT BEDTIME  30 tablet  0  . Ferrous Fumarate 324 MG TABS Take 1 tablet (106 mg of iron total) by mouth daily.  30 each  4  . folic acid (FOLVITE) 1 MG tablet TAKE 1 TABLET BY MOUTH EVERY DAY  30 tablet  3  . gabapentin (NEURONTIN) 300 MG capsule  Take 300 mg by mouth 2 (two) times daily.       . hydrOXYzine (ATARAX/VISTARIL) 25 MG tablet Take 25 mg by mouth at bedtime.      . pentosan polysulfate (ELMIRON) 100 MG capsule Take 200 mg by mouth 2 (two) times daily.      . psyllium (METAMUCIL SMOOTH TEXTURE) 28 % packet Take 1 packet by mouth daily as needed (constipation).      . ranitidine (ZANTAC) 150 MG tablet Take 150 mg by mouth 2 (two) times daily.      . risedronate (ACTONEL) 35 MG tablet       . rosuvastatin (CRESTOR) 20 MG tablet Take 1 tablet (20 mg total) by mouth at bedtime.  30 tablet  5  . telmisartan (MICARDIS) 80 MG tablet Take 1 tablet (80 mg total) by mouth daily.  30 tablet  1  . venlafaxine XR (EFFEXOR-XR) 150 MG 24 hr capsule TAKE ONE CAPSULE BY MOUTH EVERY DAY  30 capsule  3  . nystatin (MYCOSTATIN) powder Apply topically 4 (four) times daily as needed.      . sucralfate (CARAFATE) 1 G tablet Take 1 g by mouth daily as needed (acid reflex).       . trimethoprim-polymyxin b (POLYTRIM) ophthalmic solution Place 2 drops into the left eye 3 (three) times daily.  10 mL  0   No current facility-administered medications for this visit.    OBJECTIVE: Middle-aged white woman who appears stated age  68 Vitals:   10/27/13 1630  BP: 141/75  Pulse: 78  Temp: 98.1 F (36.7 C)  Resp: 18     Body mass index is 37.17 kg/(m^2).    ECOG FS: 1 Filed Weights   10/27/13 1630  Weight: 209 lb 12.8 oz (95.165 kg)   Skin: warm, dry  HEENT: sclerae anicteric, conjunctivae pink, oropharynx clear. No thrush or mucositis.  Lymph Nodes: No cervical or supraclavicular lymphadenopathy  Lungs: clear to auscultation  bilaterally, no rales, wheezes, or rhonci  Heart: regular rate and rhythm  Abdomen: round, soft, non tender, positive bowel sounds  Musculoskeletal: No focal spinal tenderness, no peripheral edema  Neuro: non focal, well oriented, anxious affect  Breasts: right breast status post lumpectomy and radiation. No evidence of  local recurrence. Right axilla benign. Left breast unremarkable.   LAB RESULTS: Lab Results  Component Value Date   WBC 6.0 10/27/2013   NEUTROABS 3.3 10/27/2013   HGB 11.5* 10/27/2013   HCT 35.7 10/27/2013   MCV 88.7 10/27/2013   PLT 263 10/27/2013      Chemistry      Component Value Date/Time   NA 143 10/27/2013 1444   NA 141 10/10/2013 0940   NA 144 10/20/2010 0854   K 4.4 10/27/2013 1444   K 4.1 10/10/2013 0940   K 4.2 10/20/2010 0854   CL 105 10/10/2013 0940   CL 107 04/08/2012 1018   CL 108 10/20/2010 0854   CO2 29 10/27/2013 1444   CO2 26 10/10/2013 0940   CO2 26 10/20/2010 0854   BUN 13.8 10/27/2013 1444   BUN 14 10/10/2013 0940   BUN 13 10/20/2010 0854   CREATININE 1.0 10/27/2013 1444   CREATININE 0.9 10/10/2013 0940   CREATININE 0.9 10/20/2010 0854      Component Value Date/Time   CALCIUM 9.6 10/27/2013 1444   CALCIUM 9.5 10/10/2013 0940   CALCIUM 9.3 03/08/2011 1447   CALCIUM 8.8 10/20/2010 0854   ALKPHOS 117 10/27/2013 1444   ALKPHOS 103 10/10/2013 0940   ALKPHOS 106* 10/20/2010 0854   AST 26 10/27/2013 1444   AST 32 10/10/2013 0940   AST 32 10/20/2010 0854   ALT 23 10/27/2013 1444   ALT 25 10/10/2013 0940   ALT 31 10/20/2010 0854   BILITOT 0.36 10/27/2013 1444   BILITOT 0.3 10/10/2013 0940   BILITOT 0.50 10/20/2010 0854       Lab Results  Component Value Date   LABCA2 19 10/03/2011     STUDIES: Most recent mammogram on 08/15/13 was unremarkable.   ASSESSMENT: 68 y.o. [place] woman status post right breast biopsy April 2011 for a clinical T2 N0 grade 3 invasive ductal carcinoma which was estrogen and progesterone receptor negative, HER-2/neu positive with an MIB-1 of 58%  (1) treated neoadjuvantly with multiple agents (due to tolerance issues):  Namely 2 cycles of standard dose paclitaxel/ carboplatin/ trastuzumab followed by 3 cycles of paclitaxel/ trastuzumab given weekly, the last of this given with weekly carboplatin, followed by 2 cycles of dose dense doxorubicin and  cyclophosphamide with the doxorubicin given by continuous infusion all of this completed in August 2011  (2) status post definitive right lumpectomy and sentinel lymph node sampling September 2011 showing a complete pathologic response both in the breast and sampled lymph nodes.    (3) completed radiation therapy November 2011.    (4) continued to receive trastuzumab until January 2012 at which time it was discontinued secondary to a drop in her ejection fraction which has now completely resolved.   PLAN:  Shannon [name] is doing well as far a her breast cancer is concerned. She is now 4 years out from her definitive surgery, with no evidence of recurrent disease. The labs were reviewed in detail and were stable.  Despite this news, she battles constant anxiety that a recurrence is "lurking in the shadows." I feel it appropriate to grant her request for CT scans of the chest, abdomen, and pelvis to soothe her fears.  She knows to call for her results the day after the scans are performed.   Shannon [name] will return for labs and a follow up visit in 6 months. Though stable, she believes 1 year is too long to go with out an exam. We are happy to oblige. She understands and agrees with this plan. She has been encouraged to call with any issues that might arise before her next visit here.     Shannon [name]    10/27/2013

## 2013-10-27 NOTE — Telephone Encounter (Signed)
per pof to sch pt appt-adv pt CS will call tosch CT-gave pt copy of sch

## 2013-11-03 ENCOUNTER — Ambulatory Visit (HOSPITAL_COMMUNITY)
Admission: RE | Admit: 2013-11-03 | Discharge: 2013-11-03 | Disposition: A | Discharge status: Home / Self Care | Payer: Medicare Other | Source: Ambulatory Visit | Attending: Nurse Practitioner | Admitting: Nurse Practitioner

## 2013-11-03 ENCOUNTER — Encounter (HOSPITAL_COMMUNITY): Payer: Self-pay

## 2013-11-03 DIAGNOSIS — I2510 Atherosclerotic heart disease of native coronary artery without angina pectoris: Secondary | ICD-10-CM | POA: Insufficient documentation

## 2013-11-03 DIAGNOSIS — M4854XA Collapsed vertebra, not elsewhere classified, thoracic region, initial encounter for fracture: Secondary | ICD-10-CM | POA: Diagnosis not present

## 2013-11-03 DIAGNOSIS — C50311 Malignant neoplasm of lower-inner quadrant of right female breast: Secondary | ICD-10-CM

## 2013-11-03 DIAGNOSIS — Z923 Personal history of irradiation: Secondary | ICD-10-CM | POA: Diagnosis not present

## 2013-11-03 DIAGNOSIS — Z171 Estrogen receptor negative status [ER-]: Secondary | ICD-10-CM | POA: Diagnosis not present

## 2013-11-03 DIAGNOSIS — J984 Other disorders of lung: Secondary | ICD-10-CM | POA: Diagnosis not present

## 2013-11-03 DIAGNOSIS — Z9889 Other specified postprocedural states: Secondary | ICD-10-CM | POA: Diagnosis not present

## 2013-11-03 DIAGNOSIS — R911 Solitary pulmonary nodule: Secondary | ICD-10-CM | POA: Diagnosis not present

## 2013-11-03 DIAGNOSIS — N8331 Acquired atrophy of ovary: Secondary | ICD-10-CM | POA: Diagnosis not present

## 2013-11-03 DIAGNOSIS — Z853 Personal history of malignant neoplasm of breast: Secondary | ICD-10-CM | POA: Diagnosis not present

## 2013-11-03 DIAGNOSIS — Z9221 Personal history of antineoplastic chemotherapy: Secondary | ICD-10-CM | POA: Insufficient documentation

## 2013-11-03 DIAGNOSIS — N858 Other specified noninflammatory disorders of uterus: Secondary | ICD-10-CM | POA: Insufficient documentation

## 2013-11-03 DIAGNOSIS — Z08 Encounter for follow-up examination after completed treatment for malignant neoplasm: Secondary | ICD-10-CM | POA: Diagnosis not present

## 2013-11-03 DIAGNOSIS — M5127 Other intervertebral disc displacement, lumbosacral region: Secondary | ICD-10-CM | POA: Diagnosis not present

## 2013-11-03 DIAGNOSIS — Z9049 Acquired absence of other specified parts of digestive tract: Secondary | ICD-10-CM | POA: Diagnosis not present

## 2013-11-03 MED ORDER — IOHEXOL 300 MG/ML  SOLN
100.0000 mL | Freq: Once | INTRAMUSCULAR | Status: AC | PRN
  Administered 2013-11-03: 100 mL via INTRAVENOUS

## 2013-11-04 ENCOUNTER — Telehealth: Payer: Self-pay

## 2013-11-04 ENCOUNTER — Encounter: Payer: Self-pay | Admitting: Nurse Practitioner

## 2013-11-04 ENCOUNTER — Other Ambulatory Visit: Payer: Self-pay | Admitting: Family Medicine

## 2013-11-04 DIAGNOSIS — I25119 Atherosclerotic heart disease of native coronary artery with unspecified angina pectoris: Secondary | ICD-10-CM

## 2013-11-04 NOTE — Telephone Encounter (Signed)
Pt informed and states she would like to proceed with referral. [place]th). Pt would like md to get her in as soon as possible (pt stated she would go tomorrow Oct 7th)  Please advise?  Per md:  Please reach out to patient and see if she is willing to return to cardiology for further evaluation due to her recent CT chest which showed atherosclerosis in her blood vessels and her recent increase in dyspepsia. i can set her up with follow up with Dr Stanford Breed if she is willing Thanks ----- Message ----- From: Marcelino Duster, NP Sent: 11/04/2013 9:05 AM To: Mosie Lukes, MD

## 2013-11-05 NOTE — Telephone Encounter (Signed)
Rx request to pharmacy/SLS  

## 2013-11-06 ENCOUNTER — Encounter: Payer: Self-pay | Admitting: Cardiology

## 2013-11-06 ENCOUNTER — Ambulatory Visit (INDEPENDENT_AMBULATORY_CARE_PROVIDER_SITE_OTHER): Payer: Medicare Other | Admitting: Cardiology

## 2013-11-06 ENCOUNTER — Encounter: Payer: Self-pay | Admitting: *Deleted

## 2013-11-06 VITALS — BP 134/80 | HR 79 | Ht 63.0 in | Wt 209.0 lb

## 2013-11-06 DIAGNOSIS — R072 Precordial pain: Secondary | ICD-10-CM | POA: Diagnosis not present

## 2013-11-06 DIAGNOSIS — R0789 Other chest pain: Secondary | ICD-10-CM

## 2013-11-06 DIAGNOSIS — I10 Essential (primary) hypertension: Secondary | ICD-10-CM

## 2013-11-06 DIAGNOSIS — I420 Dilated cardiomyopathy: Secondary | ICD-10-CM | POA: Diagnosis not present

## 2013-11-06 DIAGNOSIS — I25119 Atherosclerotic heart disease of native coronary artery with unspecified angina pectoris: Secondary | ICD-10-CM | POA: Diagnosis not present

## 2013-11-06 HISTORY — DX: Other chest pain: R07.89

## 2013-11-06 NOTE — Patient Instructions (Signed)
Your physician wants you to follow-up in: ONE YEAR WITH DR CRENSHAW You will receive a reminder letter in the mail two months in advance. If you don't receive a letter, please call our office to schedule the follow-up appointment.   Your physician has requested that you have a lexiscan myoview. For further information please visit [email or web address]. Please follow instruction sheet, as given.   

## 2013-11-06 NOTE — Progress Notes (Signed)
HPI: FU nonischemic cardiomyopathy. Patient had an echocardiogram in 2012 ejection fraction of 45-50%. Reduced LV function felt secondary to chemotherapy (herceptin). Last echocardiogram in July of 2012 showed normal LV function. Nuclear study November 2013 was normal with an ejection fraction of 76%. Patient had a chest CT in October 2015. There was atherosclerosis of the thoracic aorta, great vessels of the mediastinum and the coronary arteries. Cardiology asked to evaluate. Since last seen, She denies dyspnea, palpitations or syncope. She occasionally has chest pain at night. It is in the left breast area without radiation. She associates this with food. Last several minutes and resolve spontaneously. No associated symptoms. No exertional chest pain. Some dyspnea on exertion.   Current Outpatient Prescriptions  Medication Sig Dispense Refill  . carvedilol (COREG) 6.25 MG tablet TAKE 1 TABLET (6.25 MG TOTAL) BY MOUTH 2 (TWO) TIMES DAILY WITH A MEAL.  60 tablet  2  . clonazePAM (KLONOPIN) 1 MG tablet TAKE 1/2 TABLET BY MOUTH AT BEDTIME  30 tablet  0  . Ferrous Fumarate 324 MG TABS Take 1 tablet (106 mg of iron total) by mouth daily.  30 each  4  . folic acid (FOLVITE) 1 MG tablet TAKE 1 TABLET BY MOUTH EVERY DAY  30 tablet  3  . gabapentin (NEURONTIN) 300 MG capsule Take 300 mg by mouth 2 (two) times daily.       . hydrOXYzine (ATARAX/VISTARIL) 25 MG tablet Take 25 mg by mouth at bedtime.      Marland Kitchen nystatin (MYCOSTATIN) powder Apply topically 4 (four) times daily as needed.      . pentosan polysulfate (ELMIRON) 100 MG capsule Take 200 mg by mouth 2 (two) times daily.      . psyllium (METAMUCIL SMOOTH TEXTURE) 28 % packet Take 1 packet by mouth daily as needed (constipation).      . ranitidine (ZANTAC) 150 MG tablet Take 150 mg by mouth 2 (two) times daily.      . risedronate (ACTONEL) 35 MG tablet       . rosuvastatin (CRESTOR) 20 MG tablet Take 1 tablet (20 mg total) by mouth at bedtime.  30  tablet  5  . sucralfate (CARAFATE) 1 G tablet Take 1 g by mouth daily as needed (acid reflex).       Marland Kitchen telmisartan (MICARDIS) 80 MG tablet TAKE 1 TABLET (80 MG TOTAL) BY MOUTH DAILY.  30 tablet  1  . trimethoprim-polymyxin b (POLYTRIM) ophthalmic solution Place 2 drops into the left eye 3 (three) times daily.  10 mL  0  . venlafaxine XR (EFFEXOR-XR) 150 MG 24 hr capsule TAKE ONE CAPSULE BY MOUTH EVERY DAY  30 capsule  3   No current facility-administered medications for this visit.     Past Medical History  Diagnosis Date  . NICM (nonischemic cardiomyopathy) 03/08/2010    likely 2/2 chemotx - a. Echo 2012: EF 45-50%;  b. Lex MV 2/12:  low risk, apical defect (small area of ischemia vs shifting breast atten);  c.  Echo 7/12: Normal wall thickness, EF 60-65%, normal wall motion, grade 1 diastolic dysfunction, mild LAE, PASP 32;   d. Lex MV 11/13:  EF 76%, no ischemia  . GERD 08/11/2008  . HYPERLIPIDEMIA 08/11/2008  . HYPERTENSION 08/11/2008  . LIPOMA 02/10/2009  . PREDIABETES 06/15/2009  . 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 Carpal tunnel syndrome of left wrist   . Neuropathy   . Interstitial cystitis   .  Brain tumor   . DEPRESSION 08/11/2008  . 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 Osteoporosis   . Hyponatremia 08/28/2011  . Anemia 08/28/2011  . History of chicken pox   . H/O measles   . H/O mumps   . Dermatitis   . OA (osteoarthritis) 09/07/2011  . Melanoma 2008    DR. Ronnald Ramp  . Shortness of breath   . Asthma   . History of blood transfusion   . UTI (lower urinary tract infection) 03/03/2012  . Constipation 03/03/2012  . 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 Cholecystectomy    . Carpal tunnel release      L wrist, ulnar nerve moved  . Elbow surgery      left  . Tubal ligation  1997  . Lipoma excision  03/28/2009    right leg  . Portacath placement   may 2011  . Craniotomy for tumor  2000  . Tonsillectomy  1958  . Melanoma excision    . [place] cath    . Port-a-cath removal  11/30/2010    Streck  . Breast lumpectomy  04/2009    RIGHT FOR BREAST CANCER-CHEMO/RADIATION X 1 YEAR  . Total knee raplacement  01-2012    Right Knee  . Total knee arthroplasty  02/05/2012    Procedure: TOTAL KNEE ARTHROPLASTY;  Surgeon: Gearlean Alf, MD;  Location: [facility];  Service: Orthopedics;  Laterality: Right;  . Total knee arthroplasty Left 02/10/2013    Procedure: LEFT TOTAL KNEE ARTHROPLASTY;  Surgeon: Gearlean Alf, MD;  Location: [facility];  Service: Orthopedics;  Laterality: Left;    History   Social History  . Marital Status: Married    Spouse Name: N/A    Number of Children: 3  . Years of Education: N/A   Occupational History  . boutique owner    Social History Main Topics  . Smoking status: Never Smoker   . Smokeless tobacco: Never Used  . Alcohol Use: No  . Drug Use: No  . Sexual Activity: No   Other Topics Concern  . Not on file   Social History Narrative  . No narrative on file    ROS: no fevers or chills, productive cough, hemoptysis, dysphasia, odynophagia, melena, hematochezia, dysuria, hematuria, rash, seizure activity, orthopnea, PND, pedal edema, claudication. Remaining systems are negative.  Physical Exam: Well-developed obese in no acute distress.  Skin is warm and dry.  HEENT is normal.  Neck is supple.  Chest is clear to auscultation with normal expansion.  Cardiovascular exam is regular rate and rhythm.  Abdominal exam nontender or distended. No masses palpated. Extremities show no edema. neuro grossly intact  ECG Sinus rhythm at a rate of 76. Low voltage. No ST changes.

## 2013-11-06 NOTE — Assessment & Plan Note (Signed)
Symptoms are atypical. She was noted to have calcium in her coronaries on recent CT scan. Continue aspirin and statin. Schedule nuclear study to exclude ischemia.

## 2013-11-06 NOTE — Assessment & Plan Note (Signed)
Continue present blood pressure medication.

## 2013-11-06 NOTE — Assessment & Plan Note (Signed)
Continue statin. 

## 2013-11-06 NOTE — Assessment & Plan Note (Signed)
LV function improved on most recent echocardiogram. Continue beta blocker and ARB.

## 2013-11-08 ENCOUNTER — Other Ambulatory Visit: Payer: Self-pay | Admitting: Family Medicine

## 2013-11-14 ENCOUNTER — Telehealth (HOSPITAL_COMMUNITY): Payer: Self-pay

## 2013-11-14 ENCOUNTER — Other Ambulatory Visit: Payer: Self-pay

## 2013-11-14 NOTE — Telephone Encounter (Signed)
Encounter complete. 

## 2013-11-18 ENCOUNTER — Other Ambulatory Visit: Payer: Self-pay | Admitting: Family Medicine

## 2013-11-18 NOTE — Telephone Encounter (Signed)
Last RX was done on 09-22-13 quantity 30 with 0 refills  RX printed for md to sign and fax

## 2013-11-19 ENCOUNTER — Ambulatory Visit (HOSPITAL_COMMUNITY)
Admission: RE | Admit: 2013-11-19 | Discharge: 2013-11-19 | Disposition: A | Discharge status: Home / Self Care | Payer: Medicare Other | Source: Ambulatory Visit | Attending: Cardiovascular Disease | Admitting: Cardiovascular Disease

## 2013-11-19 DIAGNOSIS — E785 Hyperlipidemia, unspecified: Secondary | ICD-10-CM | POA: Insufficient documentation

## 2013-11-19 DIAGNOSIS — R0609 Other forms of dyspnea: Secondary | ICD-10-CM | POA: Insufficient documentation

## 2013-11-19 DIAGNOSIS — R079 Chest pain, unspecified: Secondary | ICD-10-CM | POA: Diagnosis not present

## 2013-11-19 DIAGNOSIS — R072 Precordial pain: Secondary | ICD-10-CM

## 2013-11-19 DIAGNOSIS — E669 Obesity, unspecified: Secondary | ICD-10-CM | POA: Diagnosis not present

## 2013-11-19 DIAGNOSIS — I10 Essential (primary) hypertension: Secondary | ICD-10-CM | POA: Insufficient documentation

## 2013-11-19 DIAGNOSIS — Z8249 Family history of ischemic heart disease and other diseases of the circulatory system: Secondary | ICD-10-CM | POA: Insufficient documentation

## 2013-11-19 MED ORDER — AMINOPHYLLINE 25 MG/ML IV SOLN
75.0000 mg | Freq: Once | INTRAVENOUS | Status: AC
  Administered 2013-11-19: 75 mg via INTRAVENOUS

## 2013-11-19 MED ORDER — REGADENOSON 0.4 MG/5ML IV SOLN
0.4000 mg | Freq: Once | INTRAVENOUS | Status: AC
  Administered 2013-11-19: 0.4 mg via INTRAVENOUS

## 2013-11-19 MED ORDER — TECHNETIUM TC 99M SESTAMIBI GENERIC - CARDIOLITE
30.2000 | Freq: Once | INTRAVENOUS | Status: AC | PRN
  Administered 2013-11-19: 30.2 via INTRAVENOUS

## 2013-11-19 MED ORDER — TECHNETIUM TC 99M SESTAMIBI GENERIC - CARDIOLITE
10.7000 | Freq: Once | INTRAVENOUS | Status: AC | PRN
  Administered 2013-11-19: 11 via INTRAVENOUS

## 2013-11-19 NOTE — Procedures (Addendum)
[facility] [address] [place] [address] [phone]  Cardiology Nuclear Med Study  Shannon [name] is a 68 y.o. female     MRN : [id number]     DOB: 07-22-1945  Procedure Date: 11/19/2013  Nuclear Med Background Indication for Stress Test:  Evaluation for Ischemia and Coronary calcification per CT scan. History:  Asthma and Nonischemic cardiomyopathy;Last NUC MPI on 12/25/2011-nonischemic;EF=76% Cardiac Risk Factors: Family History - CAD, Hypertension, Lipids and Obesity  Symptoms:  Chest Pain and DOE   Nuclear Pre-Procedure Caffeine/Decaff Intake:  12:00am NPO After: 10am   IV Site: L Forearm  IV 0.9% NS with Angio Cath:  22g  Chest Size (in):  n/a IV Started by: Rolene Course, RN  Height: 5\' 3"  (1.6 m)  Cup Size: C;Pt has had right lumpectomy/lymphectomy;Pt has Right breast titanium marker present;NO STICKS NO BP ON RIGHT SIDE  BMI:  Body mass index is 37.03 kg/(m^2). Weight:  209 lb (94.802 kg)   Tech Comments:  n/a    Nuclear Med Study 1 or 2 day study: 1 day  Stress Test Type:  [place] Provider:  Kirk Ruths, MD   Resting Radionuclide: Technetium 90m Sestamibi  Resting Radionuclide Dose: 10.7 mCi   Stress Radionuclide:  Technetium 60m Sestamibi  Stress Radionuclide Dose: 30.2 mCi           Stress Protocol Rest HR: 68 Stress HR:95  Rest BP: 120/82 Stress BP: 121/73  Exercise Time (min): n/a METS: n/a          Dose of Adenosine (mg):  n/a Dose of Lexiscan: 0.4 mg  Dose of Atropine (mg): n/a Dose of Dobutamine: n/a mcg/kg/min (at max HR)  Stress Test Technologist: Mellody Memos, CCT Nuclear Technologist: Imagene Riches, CNMT   Rest Procedure:  Myocardial perfusion imaging was performed at rest 45 minutes following the intravenous administration of Technetium 67m Sestamibi. Stress Procedure:  The patient received IV Lexiscan 0.4 mg over 15-seconds.  Technetium 21m Sestamibi injected  IV at 30-seconds.  Patient experienced shortness of breath and a drop in blood pressure. She was administered 75 mg of Aminophylline IV at 5 minutes. There were no significant changes with Lexiscan.  Quantitative spect images were obtained after a 45 minute delay.  Transient Ischemic Dilatation (Normal <1.22):  1.21  QGS EDV:  61 ml QGS ESV:  22 ml LV Ejection Fraction: 64%      Rest ECG: NSR - Normal EKG  Stress ECG: No significant change from baseline ECG  QPS Raw Data Images:  Normal; no motion artifact; normal heart/lung ratio. Stress Images:  There is a small area of moderate reduction in tracer uptake in the apex (SSS 8) Rest Images:  Normal homogeneous uptake in all areas of the myocardium. Subtraction (SDS):  These findings are consistent with ischemia. LV Wall Motion:  NL LV Function; NL Wall Motion  Impression Exercise Capacity:  Lexiscan with no exercise. BP Response:  Normal blood pressure response. Clinical Symptoms:  No significant symptoms noted. ECG Impression:  No significant ECG changes with Lexiscan. Comparison with Prior Nuclear Study: Previous study reported as normal perfusion   Overall Impression:  Intermediate risk stress nuclear study with a small area of moderate apical ischemia.   [name], MD  11/19/2013 4:12 PM

## 2013-11-20 ENCOUNTER — Other Ambulatory Visit: Payer: Self-pay

## 2013-11-20 MED ORDER — ROSUVASTATIN CALCIUM 20 MG PO TABS: 20.0000 mg | ORAL_TABLET | Freq: Every day | ORAL | Status: DC

## 2013-12-01 ENCOUNTER — Encounter: Payer: Self-pay | Admitting: Cardiology

## 2013-12-09 ENCOUNTER — Other Ambulatory Visit: Payer: Self-pay

## 2013-12-09 ENCOUNTER — Encounter: Payer: Self-pay | Admitting: Cardiology

## 2013-12-09 ENCOUNTER — Ambulatory Visit (INDEPENDENT_AMBULATORY_CARE_PROVIDER_SITE_OTHER): Payer: Medicare Other | Admitting: Cardiology

## 2013-12-09 ENCOUNTER — Other Ambulatory Visit: Payer: Self-pay | Admitting: Cardiology

## 2013-12-09 VITALS — BP 112/90 | HR 84 | Ht 64.0 in | Wt 211.1 lb

## 2013-12-09 DIAGNOSIS — R072 Precordial pain: Secondary | ICD-10-CM | POA: Diagnosis not present

## 2013-12-09 DIAGNOSIS — M791 Myalgia, unspecified site: Secondary | ICD-10-CM

## 2013-12-09 DIAGNOSIS — I25119 Atherosclerotic heart disease of native coronary artery with unspecified angina pectoris: Secondary | ICD-10-CM

## 2013-12-09 DIAGNOSIS — I420 Dilated cardiomyopathy: Secondary | ICD-10-CM

## 2013-12-09 DIAGNOSIS — R079 Chest pain, unspecified: Secondary | ICD-10-CM | POA: Diagnosis not present

## 2013-12-09 DIAGNOSIS — R931 Abnormal findings on diagnostic imaging of heart and coronary circulation: Secondary | ICD-10-CM | POA: Diagnosis not present

## 2013-12-09 DIAGNOSIS — Z7901 Long term (current) use of anticoagulants: Secondary | ICD-10-CM

## 2013-12-09 DIAGNOSIS — Z79899 Other long term (current) drug therapy: Secondary | ICD-10-CM | POA: Diagnosis not present

## 2013-12-09 DIAGNOSIS — R9439 Abnormal result of other cardiovascular function study: Secondary | ICD-10-CM

## 2013-12-09 LAB — COMPREHENSIVE METABOLIC PANEL
ALT: 25 U/L (ref 0–35)
AST: 25 U/L (ref 0–37)
Albumin: 4.2 g/dL (ref 3.5–5.2)
Alkaline Phosphatase: 92 U/L (ref 39–117)
BUN: 17 mg/dL (ref 6–23)
CO2: 29 meq/L (ref 19–32)
CREATININE: 0.88 mg/dL (ref 0.50–1.10)
Calcium: 9.1 mg/dL (ref 8.4–10.5)
Chloride: 105 mEq/L (ref 96–112)
GLUCOSE: 81 mg/dL (ref 70–99)
Potassium: 4.8 mEq/L (ref 3.5–5.3)
Sodium: 142 mEq/L (ref 135–145)
TOTAL PROTEIN: 6.2 g/dL (ref 6.0–8.3)
Total Bilirubin: 0.4 mg/dL (ref 0.2–1.2)

## 2013-12-09 LAB — CBC
HCT: 34.5 % — ABNORMAL LOW (ref 36.0–46.0)
Hemoglobin: 11.4 g/dL — ABNORMAL LOW (ref 12.0–15.0)
MCH: 28.7 pg (ref 26.0–34.0)
MCHC: 33 g/dL (ref 30.0–36.0)
MCV: 86.9 fL (ref 78.0–100.0)
Platelets: 221 10*3/uL (ref 150–400)
RBC: 3.97 MIL/uL (ref 3.87–5.11)
RDW: 14.4 % (ref 11.5–15.5)
WBC: 5.4 10*3/uL (ref 4.0–10.5)

## 2013-12-09 MED ORDER — FERROUS FUMARATE 324 MG PO TABS: 1.0000 | ORAL_TABLET | Freq: Every day | ORAL | Status: DC

## 2013-12-09 NOTE — Assessment & Plan Note (Signed)
Continue ARB and beta blocker. LV function improved on most recent echocardiogram. 

## 2013-12-09 NOTE — Patient Instructions (Signed)
Your physician has requested that you have a cardiac catheterization. Cardiac catheterization is used to diagnose and/or treat various heart conditions. Doctors may recommend this procedure for a number of different reasons. The most common reason is to evaluate chest pain. Chest pain can be a symptom of coronary artery disease (CAD), and cardiac catheterization can show whether plaque is narrowing or blocking your heart's arteries. This procedure is also used to evaluate the valves, as well as measure the blood flow and oxygen levels in different parts of your heart. For further information please visit [email or web address]. Please follow instruction sheet, as given.  Your physician recommends that you return for lab work in: 5-7 days prior to cardiac cath.

## 2013-12-09 NOTE — Assessment & Plan Note (Signed)
Continue statin. 

## 2013-12-09 NOTE — Progress Notes (Signed)
HPI:FU nonischemic cardiomyopathy and abnormal nuclear study. Patient had an echocardiogram in 2012 ejection fraction of 45-50%. Reduced LV function felt secondary to chemotherapy (herceptin). Last echocardiogram in July of 2012 showed normal LV function. Patient had a chest CT in October 2015. There was atherosclerosis of the thoracic aorta, great vessels of the mediastinum and the coronary arteries. Nuclear study October 2015 showed an ejection fraction of 64%. There is a small area of moderate apical ischemia report. Since last seen, She has mild dyspnea on exertion but no orthopnea, PND, pedal edema, palpitations or syncope. She occasionally has indigestion that improves with taking zantac. No exertional symptoms.  Current Outpatient Prescriptions  Medication Sig Dispense Refill  . carvedilol (COREG) 6.25 MG tablet TAKE 1 TABLET (6.25 MG TOTAL) BY MOUTH 2 (TWO) TIMES DAILY WITH A MEAL. 60 tablet 2  . clonazePAM (KLONOPIN) 1 MG tablet TAKE 1/2 TABLET BY MOUTH AT BEDTIME. 30 tablet 0  . Ferrous Fumarate 324 MG TABS Take 1 tablet (106 mg of iron total) by mouth daily. 30 each 4  . folic acid (FOLVITE) 1 MG tablet TAKE 1 TABLET BY MOUTH EVERY DAY 30 tablet 3  . gabapentin (NEURONTIN) 300 MG capsule Take 300 mg by mouth 2 (two) times daily.     . hydrOXYzine (ATARAX/VISTARIL) 25 MG tablet Take 25 mg by mouth at bedtime.    Marland Kitchen nystatin (MYCOSTATIN) powder Apply topically 4 (four) times daily as needed.    . pentosan polysulfate (ELMIRON) 100 MG capsule Take 200 mg by mouth 2 (two) times daily.    . psyllium (METAMUCIL SMOOTH TEXTURE) 28 % packet Take 1 packet by mouth daily as needed (constipation).    . ranitidine (ZANTAC) 150 MG tablet Take 150 mg by mouth 2 (two) times daily.    . risedronate (ACTONEL) 35 MG tablet     . rosuvastatin (CRESTOR) 20 MG tablet Take 1 tablet (20 mg total) by mouth at bedtime. 30 tablet 5  . sucralfate (CARAFATE) 1 G tablet Take 1 g by mouth daily as needed (acid  reflex).     Marland Kitchen telmisartan (MICARDIS) 80 MG tablet TAKE 1 TABLET (80 MG TOTAL) BY MOUTH DAILY. 30 tablet 1  . trimethoprim-polymyxin b (POLYTRIM) ophthalmic solution Place 2 drops into the left eye 3 (three) times daily. 10 mL 0  . venlafaxine XR (EFFEXOR-XR) 150 MG 24 hr capsule TAKE ONE CAPSULE BY MOUTH EVERY DAY 30 capsule 3   No current facility-administered medications for this visit.     Past Medical History  Diagnosis Date  . NICM (nonischemic cardiomyopathy) 03/08/2010    likely 2/2 chemotx - a. Echo 2012: EF 45-50%;  b. Lex MV 2/12:  low risk, apical defect (small area of ischemia vs shifting breast atten);  c.  Echo 7/12: Normal wall thickness, EF 60-65%, normal wall motion, grade 1 diastolic dysfunction, mild LAE, PASP 32;   d. Lex MV 11/13:  EF 76%, no ischemia  . GERD 08/11/2008  . HYPERLIPIDEMIA 08/11/2008  . HYPERTENSION 08/11/2008  . LIPOMA 02/10/2009  . PREDIABETES 06/15/2009  . 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 Carpal tunnel syndrome of left wrist   . Neuropathy   . Interstitial cystitis   . Brain tumor   . DEPRESSION 08/11/2008  . 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 Osteoporosis   . Hyponatremia 08/28/2011  . Anemia  08/28/2011  . History of chicken pox   . H/O measles   . H/O mumps   . Dermatitis   . OA (osteoarthritis) 09/07/2011  . Melanoma 2008    DR. Ronnald Ramp  . Shortness of breath   . Asthma   . History of blood transfusion   . UTI (lower urinary tract infection) 03/03/2012  . Constipation 03/03/2012  . 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 Cholecystectomy    . Carpal tunnel release      L wrist, ulnar nerve moved  . Elbow surgery      left  . Tubal ligation  1997  . Lipoma excision  03/28/2009    right leg  . Portacath placement  may 2011  . Craniotomy for tumor  2000  . Tonsillectomy  1958  . Melanoma excision    .  [place] cath    . Port-a-cath removal  11/30/2010    Streck  . Breast lumpectomy  04/2009    RIGHT FOR BREAST CANCER-CHEMO/RADIATION X 1 YEAR  . Total knee raplacement  01-2012    Right Knee  . Total knee arthroplasty  02/05/2012    Procedure: TOTAL KNEE ARTHROPLASTY;  Surgeon: Gearlean Alf, MD;  Location: [facility];  Service: Orthopedics;  Laterality: Right;  . Total knee arthroplasty Left 02/10/2013    Procedure: LEFT TOTAL KNEE ARTHROPLASTY;  Surgeon: Gearlean Alf, MD;  Location: [facility];  Service: Orthopedics;  Laterality: Left;    History   Social History  . Marital Status: Married    Spouse Name: N/A    Number of Children: 3  . Years of Education: N/A   Occupational History  . boutique owner    Social History Main Topics  . Smoking status: Never Smoker   . Smokeless tobacco: Never Used  . Alcohol Use: No  . Drug Use: No  . Sexual Activity: No   Other Topics Concern  . Not on file   Social History Narrative    ROS: no fevers or chills, productive cough, hemoptysis, dysphasia, odynophagia, melena, hematochezia, dysuria, hematuria, rash, seizure activity, orthopnea, PND, pedal edema, claudication. Remaining systems are negative.  Physical Exam: Well-developed obese in no acute distress.  Skin is warm and dry.  HEENT is normal.  Neck is supple.  Chest is clear to auscultation with normal expansion.  Cardiovascular exam is regular rate and rhythm.  Abdominal exam nontender or distended. No masses palpated. Extremities show no edema. neuro grossly intact

## 2013-12-09 NOTE — Assessment & Plan Note (Signed)
Blood pressure controlled. Continue present medications. 

## 2013-12-09 NOTE — Assessment & Plan Note (Signed)
Patient symptoms are atypical and possibly GI related. However her nuclear study shows anterior ischemia. We have discussed the options today including continued medical therapy versus cardiac catheterization. The risks and benefits of cardiac catheterization were discussed and she would prefer definitive evaluation. We will arrange catheterization. Continue aspirin and statin.

## 2013-12-10 LAB — PROTIME-INR
INR: 1.02 (ref <1.50)
Prothrombin Time: 13.4 seconds (ref 11.6–15.2)

## 2013-12-10 LAB — APTT: aPTT: 29 seconds (ref 24–37)

## 2013-12-16 ENCOUNTER — Ambulatory Visit (HOSPITAL_COMMUNITY)
Admission: RE | Admit: 2013-12-16 | Discharge: 2013-12-16 | Disposition: A | Discharge status: Home / Self Care | Payer: Medicare Other | Source: Ambulatory Visit | Attending: Cardiology | Admitting: Cardiology

## 2013-12-16 ENCOUNTER — Encounter (HOSPITAL_COMMUNITY)
Admission: RE | Disposition: A | Discharge status: Home / Self Care | Payer: Self-pay | Source: Ambulatory Visit | Attending: Cardiology

## 2013-12-16 DIAGNOSIS — R931 Abnormal findings on diagnostic imaging of heart and coronary circulation: Secondary | ICD-10-CM | POA: Diagnosis not present

## 2013-12-16 DIAGNOSIS — E785 Hyperlipidemia, unspecified: Secondary | ICD-10-CM | POA: Insufficient documentation

## 2013-12-16 DIAGNOSIS — Z853 Personal history of malignant neoplasm of breast: Secondary | ICD-10-CM | POA: Insufficient documentation

## 2013-12-16 DIAGNOSIS — I429 Cardiomyopathy, unspecified: Secondary | ICD-10-CM | POA: Insufficient documentation

## 2013-12-16 DIAGNOSIS — K219 Gastro-esophageal reflux disease without esophagitis: Secondary | ICD-10-CM | POA: Diagnosis not present

## 2013-12-16 DIAGNOSIS — I2510 Atherosclerotic heart disease of native coronary artery without angina pectoris: Secondary | ICD-10-CM | POA: Diagnosis not present

## 2013-12-16 DIAGNOSIS — R072 Precordial pain: Secondary | ICD-10-CM

## 2013-12-16 DIAGNOSIS — M810 Age-related osteoporosis without current pathological fracture: Secondary | ICD-10-CM | POA: Insufficient documentation

## 2013-12-16 DIAGNOSIS — F329 Major depressive disorder, single episode, unspecified: Secondary | ICD-10-CM | POA: Diagnosis not present

## 2013-12-16 DIAGNOSIS — I10 Essential (primary) hypertension: Secondary | ICD-10-CM | POA: Insufficient documentation

## 2013-12-16 DIAGNOSIS — J45909 Unspecified asthma, uncomplicated: Secondary | ICD-10-CM | POA: Diagnosis not present

## 2013-12-16 DIAGNOSIS — M1990 Unspecified osteoarthritis, unspecified site: Secondary | ICD-10-CM | POA: Diagnosis not present

## 2013-12-16 DIAGNOSIS — R0609 Other forms of dyspnea: Secondary | ICD-10-CM | POA: Diagnosis not present

## 2013-12-16 DIAGNOSIS — R9439 Abnormal result of other cardiovascular function study: Secondary | ICD-10-CM

## 2013-12-16 DIAGNOSIS — R0600 Dyspnea, unspecified: Secondary | ICD-10-CM | POA: Diagnosis present

## 2013-12-16 HISTORY — DX: Abnormal result of other cardiovascular function study: R94.39

## 2013-12-16 HISTORY — PX: LEFT HEART CATHETERIZATION WITH CORONARY ANGIOGRAM: SHX5451

## 2013-12-16 SURGERY — LEFT HEART CATHETERIZATION WITH CORONARY ANGIOGRAM
Anesthesia: LOCAL

## 2013-12-16 MED ORDER — ASPIRIN 81 MG PO CHEW
81.0000 mg | CHEWABLE_TABLET | ORAL | Status: AC
  Administered 2013-12-16: 81 mg via ORAL

## 2013-12-16 MED ORDER — LIDOCAINE HCL (PF) 1 % IJ SOLN
INTRAMUSCULAR | Status: AC
  Filled 2013-12-16: qty 30

## 2013-12-16 MED ORDER — MIDAZOLAM HCL 2 MG/2ML IJ SOLN
INTRAMUSCULAR | Status: AC
  Filled 2013-12-16: qty 2

## 2013-12-16 MED ORDER — NITROGLYCERIN 1 MG/10 ML FOR IR/CATH LAB
INTRA_ARTERIAL | Status: AC
  Filled 2013-12-16: qty 10

## 2013-12-16 MED ORDER — HEPARIN (PORCINE) IN NACL 2-0.9 UNIT/ML-% IJ SOLN
INTRAMUSCULAR | Status: AC
  Filled 2013-12-16: qty 1000

## 2013-12-16 MED ORDER — FENTANYL CITRATE 0.05 MG/ML IJ SOLN
INTRAMUSCULAR | Status: AC
  Filled 2013-12-16: qty 2

## 2013-12-16 MED ORDER — SODIUM CHLORIDE 0.9 % IV SOLN
1.0000 mL/kg/h | INTRAVENOUS | Status: DC
  Administered 2013-12-16: 1 mL/kg/h via INTRAVENOUS

## 2013-12-16 MED ORDER — ASPIRIN 81 MG PO CHEW
CHEWABLE_TABLET | ORAL | Status: AC
  Filled 2013-12-16: qty 1

## 2013-12-16 MED ORDER — SODIUM CHLORIDE 0.9 % IV SOLN: 1.0000 mL/kg/h | INTRAVENOUS | Status: DC

## 2013-12-16 MED ORDER — SODIUM CHLORIDE 0.9 % IJ SOLN: 3.0000 mL | Freq: Two times a day (BID) | INTRAMUSCULAR | Status: DC

## 2013-12-16 MED ORDER — SODIUM CHLORIDE 0.9 % IJ SOLN: 3.0000 mL | INTRAMUSCULAR | Status: DC | PRN

## 2013-12-16 MED ORDER — SODIUM CHLORIDE 0.9 % IV SOLN: 250.0000 mL | INTRAVENOUS | Status: DC | PRN

## 2013-12-16 NOTE — Progress Notes (Addendum)
Site area: Right groin a 5 french sheath was removed  Site Prior to Removal:  Level 0  Pressure Applied For 20 MINUTES    Minutes Beginning at 0825am  Manual:   Yes.    Patient Status During Pull:  stable  Post Pull Groin Site:  Level 0  Post Pull Instructions Given:  Yes.    Post Pull Pulses Present:  Yes.    Dressing Applied:  Yes.    Comments:  Pt remain stable during sheath pull.  Pt denies any pain at this time

## 2013-12-16 NOTE — CV Procedure (Signed)
   Cardiac Catheterization Procedure Note  Name: Shannon [name] MRN: [id number] DOB: 15-Sep-1945  Procedure: Left Heart Cath, Selective Coronary Angiography, LV angiography  Indication: 68 yo WF with dyspnea and intermediate risk myoview study.   Procedural details: The right groin was prepped, draped, and anesthetized with 1% lidocaine. Using modified Seldinger technique, a 5 French sheath was introduced into the right femoral artery. Standard Judkins catheters were used for coronary angiography and left ventriculography. A Noto catheter was used to engage the RCA. Catheter exchanges were performed over a guidewire. There were no immediate procedural complications. The patient was transferred to the post catheterization recovery area for further monitoring.  Procedural Findings: Hemodynamics:  AO 132/58 mean 87 mm Hg LV 136/17 mm Hg   Coronary angiography: Coronary dominance: right  Left mainstem: Normal.  Left anterior descending (LAD): The LAD distally is small in caliber but otherwise appears normal.   Ramus intermediate: large branch, normal.   Left circumflex (LCx): Normal.  Right coronary artery (RCA): Mild diffuse irregularities less than 20%.   Left ventriculography: Left ventricular systolic function is low normal, LVEF is estimated at 50-55%, there is no significant mitral regurgitation   Final Conclusions:   1. Minor nonobstructive CAD 2. Low normal LV function.  Recommendations: medical management.  [name] [name], [name] 12/16/2013, 8:17 AM

## 2013-12-16 NOTE — Interval H&P Note (Signed)
History and Physical Interval Note:  12/16/2013 7:50 AM  Shannon [name]  has presented today for surgery, with the diagnosis of cp/adnormal nuc  The various methods of treatment have been discussed with the patient and family. After consideration of risks, benefits and other options for treatment, the patient has consented to  Procedure(s): LEFT HEART CATHETERIZATION WITH CORONARY ANGIOGRAM (N/A) as a surgical intervention .  The patient's history has been reviewed, patient examined, no change in status, stable for surgery.  I have reviewed the patient's chart and labs.  Questions were answered to the patient's satisfaction.   Cath Lab Visit (complete for each Cath Lab visit)  Clinical Evaluation Leading to the Procedure:   ACS: No.  Non-ACS:    Anginal Classification: CCS II  Anti-ischemic medical therapy: Minimal Therapy (1 class of medications)  Non-Invasive Test Results: Intermediate-risk stress test findings: cardiac mortality 1-3%/year  Prior CABG: No previous CABG        Collier [facility] 12/16/2013 7:50 AM

## 2013-12-16 NOTE — H&P (View-Only) (Signed)
    HPI:FU nonischemic cardiomyopathy and abnormal nuclear study. Patient had an echocardiogram in 2012 ejection fraction of 45-50%. Reduced LV function felt secondary to chemotherapy (herceptin). Last echocardiogram in July of 2012 showed normal LV function. Patient had a chest CT in October 2015. There was atherosclerosis of the thoracic aorta, great vessels of the mediastinum and the coronary arteries. Nuclear study October 2015 showed an ejection fraction of 64%. There is a small area of moderate apical ischemia report. Since last seen, She has mild dyspnea on exertion but no orthopnea, PND, pedal edema, palpitations or syncope. She occasionally has indigestion that improves with taking zantac. No exertional symptoms.  Current Outpatient Prescriptions  Medication Sig Dispense Refill  . carvedilol (COREG) 6.25 MG tablet TAKE 1 TABLET (6.25 MG TOTAL) BY MOUTH 2 (TWO) TIMES DAILY WITH A MEAL. 60 tablet 2  . clonazePAM (KLONOPIN) 1 MG tablet TAKE 1/2 TABLET BY MOUTH AT BEDTIME. 30 tablet 0  . Ferrous Fumarate 324 MG TABS Take 1 tablet (106 mg of iron total) by mouth daily. 30 each 4  . folic acid (FOLVITE) 1 MG tablet TAKE 1 TABLET BY MOUTH EVERY DAY 30 tablet 3  . gabapentin (NEURONTIN) 300 MG capsule Take 300 mg by mouth 2 (two) times daily.     . hydrOXYzine (ATARAX/VISTARIL) 25 MG tablet Take 25 mg by mouth at bedtime.    . nystatin (MYCOSTATIN) powder Apply topically 4 (four) times daily as needed.    . pentosan polysulfate (ELMIRON) 100 MG capsule Take 200 mg by mouth 2 (two) times daily.    . psyllium (METAMUCIL SMOOTH TEXTURE) 28 % packet Take 1 packet by mouth daily as needed (constipation).    . ranitidine (ZANTAC) 150 MG tablet Take 150 mg by mouth 2 (two) times daily.    . risedronate (ACTONEL) 35 MG tablet     . rosuvastatin (CRESTOR) 20 MG tablet Take 1 tablet (20 mg total) by mouth at bedtime. 30 tablet 5  . sucralfate (CARAFATE) 1 G tablet Take 1 g by mouth daily as needed (acid  reflex).     . telmisartan (MICARDIS) 80 MG tablet TAKE 1 TABLET (80 MG TOTAL) BY MOUTH DAILY. 30 tablet 1  . trimethoprim-polymyxin b (POLYTRIM) ophthalmic solution Place 2 drops into the left eye 3 (three) times daily. 10 mL 0  . venlafaxine XR (EFFEXOR-XR) 150 MG 24 hr capsule TAKE ONE CAPSULE BY MOUTH EVERY DAY 30 capsule 3   No current facility-administered medications for this visit.     Past Medical History  Diagnosis Date  . NICM (nonischemic cardiomyopathy) 03/08/2010    likely 2/2 chemotx - a. Echo 2012: EF 45-50%;  b. Lex MV 2/12:  low risk, apical defect (small area of ischemia vs shifting breast atten);  c.  Echo 7/12: Normal wall thickness, EF 60-65%, normal wall motion, grade 1 diastolic dysfunction, mild LAE, PASP 32;   d. Lex MV 11/13:  EF 76%, no ischemia  . GERD 08/11/2008  . HYPERLIPIDEMIA 08/11/2008  . HYPERTENSION 08/11/2008  . LIPOMA 02/10/2009  . PREDIABETES 06/15/2009  . 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 Carpal tunnel syndrome of left wrist   . Neuropathy   . Interstitial cystitis   . Brain tumor   . DEPRESSION 08/11/2008  . 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 Osteoporosis   . Hyponatremia 08/28/2011  . Anemia   08/28/2011  . History of chicken pox   . H/O measles   . H/O mumps   . Dermatitis   . OA (osteoarthritis) 09/07/2011  . Melanoma 2008    DR. JONES  . Shortness of breath   . Asthma   . History of blood transfusion   . UTI (lower urinary tract infection) 03/03/2012  . Constipation 03/03/2012  . 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 Cholecystectomy    . Carpal tunnel release      L wrist, ulnar nerve moved  . Elbow surgery      left  . Tubal ligation  1997  . Lipoma excision  03/28/2009    right leg  . Portacath placement  may 2011  . Craniotomy for tumor  2000  . Tonsillectomy  1958  . Melanoma excision    .  Porta cath    . Port-a-cath removal  11/30/2010    Streck  . Breast lumpectomy  04/2009    RIGHT FOR BREAST CANCER-CHEMO/RADIATION X 1 YEAR  . Total knee raplacement  01-2012    Right Knee  . Total knee arthroplasty  02/05/2012    Procedure: TOTAL KNEE ARTHROPLASTY;  Surgeon: Frank V Aluisio, MD;  Location: [facility];  Service: Orthopedics;  Laterality: Right;  . Total knee arthroplasty Left 02/10/2013    Procedure: LEFT TOTAL KNEE ARTHROPLASTY;  Surgeon: Frank V Aluisio, MD;  Location: [facility];  Service: Orthopedics;  Laterality: Left;    History   Social History  . Marital Status: Married    Spouse Name: N/A    Number of Children: 3  . Years of Education: N/A   Occupational History  . boutique owner    Social History Main Topics  . Smoking status: Never Smoker   . Smokeless tobacco: Never Used  . Alcohol Use: No  . Drug Use: No  . Sexual Activity: No   Other Topics Concern  . Not on file   Social History Narrative    ROS: no fevers or chills, productive cough, hemoptysis, dysphasia, odynophagia, melena, hematochezia, dysuria, hematuria, rash, seizure activity, orthopnea, PND, pedal edema, claudication. Remaining systems are negative.  Physical Exam: Well-developed obese in no acute distress.  Skin is warm and dry.  HEENT is normal.  Neck is supple.  Chest is clear to auscultation with normal expansion.  Cardiovascular exam is regular rate and rhythm.  Abdominal exam nontender or distended. No masses palpated. Extremities show no edema. neuro grossly intact       

## 2013-12-16 NOTE — Discharge Instructions (Signed)
Angiogram, Care After °Refer to this sheet in the next few weeks. These instructions provide you with information on caring for yourself after your procedure. Your health care provider may also give you more specific instructions. Your treatment has been planned according to current medical practices, but problems sometimes occur. Call your health care provider if you have any problems or questions after your procedure.  °WHAT TO EXPECT AFTER THE PROCEDURE °After your procedure, it is typical to have the following sensations: °· Minor discomfort or tenderness and a small bump at the catheter insertion site. The bump should usually decrease in size and tenderness within 1 to 2 weeks. °· Any bruising will usually fade within 2 to 4 weeks. °HOME CARE INSTRUCTIONS  °· You may need to keep taking blood thinners if they were prescribed for you. Take medicines only as directed by your health care provider. °· Do not apply powder or lotion to the site. °· Do not take baths, swim, or use a hot tub until your health care provider approves. °· You may shower 24 hours after the procedure. Remove the bandage (dressing) and gently wash the site with plain soap and water. Gently pat the site dry. °· Inspect the site at least twice daily. °· Limit your activity for the first 48 hours. Do not bend, squat, or lift anything over 20 lb (9 kg) or as directed by your health care provider. °· Plan to have someone take you home after the procedure. Follow instructions about when you can drive or return to work. °SEEK MEDICAL CARE IF: °· You get light-headed when standing up. °· You have drainage (other than a small amount of blood on the dressing). °· You have chills. °· You have a fever. °· You have redness, warmth, swelling, or pain at the insertion site. °SEEK IMMEDIATE MEDICAL CARE IF:  °· You develop chest pain or shortness of breath, feel faint, or pass out. °· You have bleeding, swelling larger than a walnut, or drainage from the  catheter insertion site. °· You develop pain, discoloration, coldness, or severe bruising in the leg or arm that held the catheter. °· You have heavy bleeding from the site. If this happens, hold pressure on the site and call 911. °MAKE SURE YOU: °· Understand these instructions. °· Will watch your condition. °· Will get help right away if you are not doing well or get worse. °Document Released: 08/04/2004 Document Revised: 06/02/2013 Document Reviewed: 06/10/2012 °ExitCare® Patient Information ©2015 [employer]. This information is not intended to replace advice given to you by your health care provider. Make sure you discuss any questions you have with your health care provider. ° °

## 2013-12-26 ENCOUNTER — Other Ambulatory Visit: Payer: Self-pay | Admitting: Family Medicine

## 2013-12-30 DIAGNOSIS — S32010D Wedge compression fracture of first lumbar vertebra, subsequent encounter for fracture with routine healing: Secondary | ICD-10-CM | POA: Diagnosis not present

## 2013-12-30 DIAGNOSIS — S22000D Wedge compression fracture of unspecified thoracic vertebra, subsequent encounter for fracture with routine healing: Secondary | ICD-10-CM | POA: Diagnosis not present

## 2014-01-05 ENCOUNTER — Other Ambulatory Visit: Payer: Self-pay

## 2014-01-05 MED ORDER — RISEDRONATE SODIUM 35 MG PO TABS: 35.0000 mg | ORAL_TABLET | ORAL | Status: DC

## 2014-01-06 ENCOUNTER — Other Ambulatory Visit: Payer: Self-pay | Admitting: Family Medicine

## 2014-01-06 NOTE — Telephone Encounter (Signed)
Last rx was wrote on 11-18-13 with no refills  rx printed for md to sign and fax

## 2014-01-08 ENCOUNTER — Encounter (HOSPITAL_COMMUNITY): Payer: Self-pay | Admitting: Cardiology

## 2014-01-08 DIAGNOSIS — M545 Low back pain: Secondary | ICD-10-CM | POA: Diagnosis not present

## 2014-01-08 DIAGNOSIS — M546 Pain in thoracic spine: Secondary | ICD-10-CM | POA: Diagnosis not present

## 2014-01-12 DIAGNOSIS — H2513 Age-related nuclear cataract, bilateral: Secondary | ICD-10-CM | POA: Diagnosis not present

## 2014-01-12 DIAGNOSIS — M545 Low back pain: Secondary | ICD-10-CM | POA: Diagnosis not present

## 2014-01-12 DIAGNOSIS — H43812 Vitreous degeneration, left eye: Secondary | ICD-10-CM | POA: Diagnosis not present

## 2014-01-12 DIAGNOSIS — H04123 Dry eye syndrome of bilateral lacrimal glands: Secondary | ICD-10-CM | POA: Diagnosis not present

## 2014-01-12 DIAGNOSIS — M546 Pain in thoracic spine: Secondary | ICD-10-CM | POA: Diagnosis not present

## 2014-01-13 ENCOUNTER — Ambulatory Visit (INDEPENDENT_AMBULATORY_CARE_PROVIDER_SITE_OTHER): Payer: Medicare Other | Admitting: Women's Health

## 2014-01-13 ENCOUNTER — Encounter: Payer: Self-pay | Admitting: Women's Health

## 2014-01-13 ENCOUNTER — Other Ambulatory Visit (HOSPITAL_COMMUNITY)
Admission: RE | Admit: 2014-01-13 | Discharge: 2014-01-13 | Disposition: A | Discharge status: Home / Self Care | Payer: Medicare Other | Source: Ambulatory Visit | Attending: Gynecology | Admitting: Gynecology

## 2014-01-13 VITALS — BP 126/80 | Ht 63.0 in | Wt 210.0 lb

## 2014-01-13 DIAGNOSIS — Z01419 Encounter for gynecological examination (general) (routine) without abnormal findings: Secondary | ICD-10-CM | POA: Diagnosis not present

## 2014-01-13 DIAGNOSIS — M810 Age-related osteoporosis without current pathological fracture: Secondary | ICD-10-CM | POA: Diagnosis not present

## 2014-01-13 DIAGNOSIS — Z124 Encounter for screening for malignant neoplasm of cervix: Secondary | ICD-10-CM

## 2014-01-13 DIAGNOSIS — I25119 Atherosclerotic heart disease of native coronary artery with unspecified angina pectoris: Secondary | ICD-10-CM | POA: Diagnosis not present

## 2014-01-13 MED ORDER — ALENDRONATE SODIUM 70 MG PO TABS: 70.0000 mg | ORAL_TABLET | ORAL | Status: DC

## 2014-01-13 NOTE — Addendum Note (Signed)
Addended by: Burnett Kanaris on: 01/13/2014 11:55 AM   Modules accepted: Orders, SmartSet

## 2014-01-13 NOTE — Progress Notes (Signed)
JOSIANE LABINE August 03, 1968 [id number]    History:    Presents for breast and pelvic exam. Post menopausal/no HRT/no bleeding. 2011 right breast cancer BRCA negative, normal mammograms 07/2013. 2013 osteoporosis T score -2.6 has been on Atelvia for 3 years now without problem except for cost. 2000 benign brain tumor. 2014, 2015 knee replacements. Negative colonoscopy 2011. Normal Pap history. Hypertension/hypercholesterolemia/IC primary care manages. 2008 melanoma has annual skin checks. Husband history of infidelity, counseling and negative STD screening after.  Past medical history, past surgical history, family history and social history were all reviewed and documented in the EPIC chart. Retired. Has 3 children, [name] - Down's increasing problems with memory.  ROS:  A  12 point ROS was performed and pertinent positives and negatives are included.  Exam:  Filed Vitals:   01/13/14 1042  BP: 126/80    General appearance:  Normal Thyroid:  Symmetrical, normal in size, without palpable masses or nodularity. Respiratory  Auscultation:  Clear without wheezing or rhonchi Cardiovascular  Auscultation:  Regular rate, without rubs, murmurs or gallops  Edema/varicosities:  Not grossly evident Abdominal  Soft,nontender, without masses, guarding or rebound.  Liver/spleen:  No organomegaly noted  Hernia:  None appreciated  Skin  Inspection:  Grossly normal   Breasts: Examined lying and sitting.     Right: Without masses, retractions, discharge or axillary adenopathy.     Left: Without masses, retractions, discharge or axillary adenopathy. Gentitourinary   Inguinal/mons:  Normal without inguinal adenopathy  External genitalia:  Normal  BUS/Urethra/Skene's glands:  Normal  Vagina:  Normal  Cervix:  Normal  Uterus:   normal in size, shape and contour.  Midline and mobile  Adnexa/parametria:     Rt: Without masses or tenderness.   Lt: Without masses or tenderness.  Anus and  perineum: Normal  Digital rectal exam: Normal sphincter tone without palpated masses or tenderness  Assessment/Plan:  68 y.o. MWF G3 P3 for breast and pelvic exam.  Postmenopausal/no bleeding/no HRT 2011 right breast cancer BRCA negative Osteoporosis Atelvia for 3 years  2008 melanoma annual skin checks  Hypertension/hypercholesterolemia/IC primary care manages labs and meds  Plan: SBE's, continue annual diagnostic mammogram, follow-up with oncologist. Repeat DEXA, Fosamax 70 weekly prescription, proper use reviewed. Will call if problem reviewed proper administration. Home safety, fall prevention and importance of regular exercise reviewed. Currently in water aerobics/physical therapy. Continue annual skin checks. Pap per request reviewed will no longer need Paps. Current on vaccines.    Huel C[facility], 11:28 AM 01/13/2014

## 2014-01-13 NOTE — Patient Instructions (Signed)

## 2014-01-14 DIAGNOSIS — M545 Low back pain: Secondary | ICD-10-CM | POA: Diagnosis not present

## 2014-01-14 DIAGNOSIS — M546 Pain in thoracic spine: Secondary | ICD-10-CM | POA: Diagnosis not present

## 2014-01-14 LAB — CYTOLOGY - PAP

## 2014-01-15 ENCOUNTER — Ambulatory Visit: Payer: Medicare Other | Admitting: Cardiology

## 2014-01-16 DIAGNOSIS — M546 Pain in thoracic spine: Secondary | ICD-10-CM | POA: Diagnosis not present

## 2014-01-16 DIAGNOSIS — M545 Low back pain: Secondary | ICD-10-CM | POA: Diagnosis not present

## 2014-01-19 DIAGNOSIS — M545 Low back pain: Secondary | ICD-10-CM | POA: Diagnosis not present

## 2014-01-19 DIAGNOSIS — M546 Pain in thoracic spine: Secondary | ICD-10-CM | POA: Diagnosis not present

## 2014-01-21 DIAGNOSIS — M545 Low back pain: Secondary | ICD-10-CM | POA: Diagnosis not present

## 2014-01-21 DIAGNOSIS — M546 Pain in thoracic spine: Secondary | ICD-10-CM | POA: Diagnosis not present

## 2014-01-28 ENCOUNTER — Other Ambulatory Visit: Payer: Self-pay

## 2014-01-28 DIAGNOSIS — M546 Pain in thoracic spine: Secondary | ICD-10-CM | POA: Diagnosis not present

## 2014-01-28 DIAGNOSIS — M545 Low back pain: Secondary | ICD-10-CM | POA: Diagnosis not present

## 2014-01-28 NOTE — Telephone Encounter (Signed)
Pharmacy sent fax request for generic Actonel. I called and spoke with pharmacy and he does have the new Rx for Fosamax.  I told him refill on Actonel not appropriate and if the patient had questions or wanted to discuss just have her call us.

## 2014-01-29 ENCOUNTER — Other Ambulatory Visit: Payer: Self-pay | Admitting: Family Medicine

## 2014-01-29 ENCOUNTER — Other Ambulatory Visit: Payer: Self-pay

## 2014-01-29 NOTE — Telephone Encounter (Signed)
Rx's sent to the pharmacy by e-script.//AB/CMA 

## 2014-02-02 ENCOUNTER — Other Ambulatory Visit: Payer: Self-pay | Admitting: Gynecology

## 2014-02-02 DIAGNOSIS — M545 Low back pain: Secondary | ICD-10-CM | POA: Diagnosis not present

## 2014-02-02 DIAGNOSIS — M810 Age-related osteoporosis without current pathological fracture: Secondary | ICD-10-CM

## 2014-02-02 DIAGNOSIS — M546 Pain in thoracic spine: Secondary | ICD-10-CM | POA: Diagnosis not present

## 2014-02-05 DIAGNOSIS — M545 Low back pain: Secondary | ICD-10-CM | POA: Diagnosis not present

## 2014-02-05 DIAGNOSIS — M546 Pain in thoracic spine: Secondary | ICD-10-CM | POA: Diagnosis not present

## 2014-02-09 DIAGNOSIS — M545 Low back pain: Secondary | ICD-10-CM | POA: Diagnosis not present

## 2014-02-09 DIAGNOSIS — M546 Pain in thoracic spine: Secondary | ICD-10-CM | POA: Diagnosis not present

## 2014-02-10 ENCOUNTER — Ambulatory Visit (INDEPENDENT_AMBULATORY_CARE_PROVIDER_SITE_OTHER): Payer: Medicare Other

## 2014-02-10 DIAGNOSIS — H04123 Dry eye syndrome of bilateral lacrimal glands: Secondary | ICD-10-CM | POA: Diagnosis not present

## 2014-02-10 DIAGNOSIS — H02834 Dermatochalasis of left upper eyelid: Secondary | ICD-10-CM | POA: Diagnosis not present

## 2014-02-10 DIAGNOSIS — H524 Presbyopia: Secondary | ICD-10-CM | POA: Diagnosis not present

## 2014-02-10 DIAGNOSIS — H02831 Dermatochalasis of right upper eyelid: Secondary | ICD-10-CM | POA: Diagnosis not present

## 2014-02-10 DIAGNOSIS — M810 Age-related osteoporosis without current pathological fracture: Secondary | ICD-10-CM | POA: Diagnosis not present

## 2014-02-20 ENCOUNTER — Other Ambulatory Visit: Payer: Self-pay | Admitting: Family Medicine

## 2014-02-24 DIAGNOSIS — S32010A Wedge compression fracture of first lumbar vertebra, initial encounter for closed fracture: Secondary | ICD-10-CM | POA: Diagnosis not present

## 2014-02-24 DIAGNOSIS — M4316 Spondylolisthesis, lumbar region: Secondary | ICD-10-CM | POA: Diagnosis not present

## 2014-02-24 NOTE — Telephone Encounter (Signed)
I have signed the refill

## 2014-02-24 NOTE — Telephone Encounter (Signed)
Medication Detail      Disp Refills Start End     clonazePAM (KLONOPIN) 1 MG tablet 30 tablet 0 01/06/2014     Sig: TAKE 1/2 TABLET BY MOUTH AT BEDTIME.    Class: Print    Notes to Pharmacy: Not to exceed 6 additional fills before 03/22/2014   Pharmacy    [id number] - [place], [place] - [address]   Follow-up Instructions          AVS:  10/10/13   Return in about 6 months (around 04/10/2014) for Medicare wellness and PE, lipid, renal, cbc, tsh, hepatic, hgba1c prior

## 2014-02-25 MED ORDER — CLONAZEPAM 1 MG PO TABS: ORAL_TABLET | ORAL | Status: DC

## 2014-02-25 NOTE — Telephone Encounter (Signed)
Reprint for in office provider to sign, as PCP is out of office today; faxed to pharmacy/[facility]

## 2014-02-25 NOTE — Addendum Note (Signed)
Addended by: Rockwell Germany on: 02/25/2014 09:13 AM   Modules accepted: Orders

## 2014-03-06 DIAGNOSIS — Z96653 Presence of artificial knee joint, bilateral: Secondary | ICD-10-CM | POA: Diagnosis not present

## 2014-03-06 DIAGNOSIS — Z96651 Presence of right artificial knee joint: Secondary | ICD-10-CM | POA: Diagnosis not present

## 2014-03-06 DIAGNOSIS — Z471 Aftercare following joint replacement surgery: Secondary | ICD-10-CM | POA: Diagnosis not present

## 2014-03-06 DIAGNOSIS — Z96652 Presence of left artificial knee joint: Secondary | ICD-10-CM | POA: Diagnosis not present

## 2014-03-18 ENCOUNTER — Other Ambulatory Visit: Payer: Self-pay | Admitting: Family Medicine

## 2014-03-18 DIAGNOSIS — N62 Hypertrophy of breast: Secondary | ICD-10-CM | POA: Diagnosis not present

## 2014-03-19 ENCOUNTER — Other Ambulatory Visit: Payer: Self-pay | Admitting: Family Medicine

## 2014-04-03 ENCOUNTER — Telehealth: Payer: Self-pay | Admitting: Oncology

## 2014-04-03 NOTE — Telephone Encounter (Signed)
per GM to r/s pt appt w/[facility]-c[facility] & left pt a message of updated time same date

## 2014-04-13 DIAGNOSIS — M542 Cervicalgia: Secondary | ICD-10-CM | POA: Diagnosis not present

## 2014-04-13 DIAGNOSIS — M546 Pain in thoracic spine: Secondary | ICD-10-CM | POA: Diagnosis not present

## 2014-04-13 DIAGNOSIS — N62 Hypertrophy of breast: Secondary | ICD-10-CM | POA: Diagnosis not present

## 2014-04-16 ENCOUNTER — Other Ambulatory Visit: Payer: Self-pay | Admitting: Family Medicine

## 2014-04-16 NOTE — Telephone Encounter (Signed)
Faxed hardcopy for Clonazepam to [facility]

## 2014-04-20 ENCOUNTER — Other Ambulatory Visit: Payer: Self-pay | Admitting: Family Medicine

## 2014-04-20 NOTE — Telephone Encounter (Signed)
Med filled.  

## 2014-04-21 ENCOUNTER — Other Ambulatory Visit: Payer: Self-pay | Admitting: *Deleted

## 2014-04-21 DIAGNOSIS — R3915 Urgency of urination: Secondary | ICD-10-CM | POA: Diagnosis not present

## 2014-04-21 DIAGNOSIS — R102 Pelvic and perineal pain: Secondary | ICD-10-CM | POA: Diagnosis not present

## 2014-04-21 DIAGNOSIS — R350 Frequency of micturition: Secondary | ICD-10-CM | POA: Diagnosis not present

## 2014-04-21 DIAGNOSIS — N3010 Interstitial cystitis (chronic) without hematuria: Secondary | ICD-10-CM | POA: Diagnosis not present

## 2014-04-21 MED ORDER — FERROUS FUMARATE 324 MG PO TABS: 1.0000 | ORAL_TABLET | Freq: Every day | ORAL | Status: DC

## 2014-04-23 ENCOUNTER — Encounter (HOSPITAL_BASED_OUTPATIENT_CLINIC_OR_DEPARTMENT_OTHER): Payer: Self-pay | Admitting: *Deleted

## 2014-04-23 NOTE — Progress Notes (Signed)
Will come in for bmet Had cath 12/15-good Chemo 2012-had to stop-ef went to 45%-echo 2013-up to ef 76%

## 2014-04-27 ENCOUNTER — Other Ambulatory Visit (HOSPITAL_BASED_OUTPATIENT_CLINIC_OR_DEPARTMENT_OTHER): Payer: Medicare Other

## 2014-04-27 ENCOUNTER — Encounter: Payer: Self-pay | Admitting: Nurse Practitioner

## 2014-04-27 ENCOUNTER — Ambulatory Visit (HOSPITAL_BASED_OUTPATIENT_CLINIC_OR_DEPARTMENT_OTHER): Payer: Medicare Other | Admitting: Nurse Practitioner

## 2014-04-27 ENCOUNTER — Telehealth: Payer: Self-pay | Admitting: Nurse Practitioner

## 2014-04-27 VITALS — BP 135/63 | HR 88 | Temp 97.7°F | Resp 18 | Ht 63.0 in | Wt 206.0 lb

## 2014-04-27 DIAGNOSIS — C50311 Malignant neoplasm of lower-inner quadrant of right female breast: Secondary | ICD-10-CM

## 2014-04-27 DIAGNOSIS — Z853 Personal history of malignant neoplasm of breast: Secondary | ICD-10-CM

## 2014-04-27 LAB — COMPREHENSIVE METABOLIC PANEL (CC13)
ALK PHOS: 86 U/L (ref 40–150)
ALT: 31 U/L (ref 0–55)
AST: 28 U/L (ref 5–34)
Albumin: 4.1 g/dL (ref 3.5–5.0)
Anion Gap: 10 mEq/L (ref 3–11)
BUN: 11.1 mg/dL (ref 7.0–26.0)
CALCIUM: 9.7 mg/dL (ref 8.4–10.4)
CHLORIDE: 106 meq/L (ref 98–109)
CO2: 26 mEq/L (ref 22–29)
Creatinine: 0.9 mg/dL (ref 0.6–1.1)
EGFR: 68 mL/min/{1.73_m2} — ABNORMAL LOW (ref >90)
Glucose: 93 mg/dl (ref 70–140)
Potassium: 4.4 mEq/L (ref 3.5–5.1)
SODIUM: 142 meq/L (ref 136–145)
TOTAL PROTEIN: 6.8 g/dL (ref 6.4–8.3)
Total Bilirubin: 0.44 mg/dL (ref 0.20–1.20)

## 2014-04-27 LAB — CBC WITH DIFFERENTIAL/PLATELET
BASO%: 0.6 % (ref 0.0–2.0)
Basophils Absolute: 0 10*3/uL (ref 0.0–0.1)
EOS%: 6.2 % (ref 0.0–7.0)
Eosinophils Absolute: 0.3 10*3/uL (ref 0.0–0.5)
HEMATOCRIT: 37.4 % (ref 34.8–46.6)
HGB: 12.2 g/dL (ref 11.6–15.9)
LYMPH%: 26.2 % (ref 14.0–49.7)
MCH: 29.7 pg (ref 25.1–34.0)
MCHC: 32.6 g/dL (ref 31.5–36.0)
MCV: 91 fL (ref 79.5–101.0)
MONO#: 0.6 10*3/uL (ref 0.1–0.9)
MONO%: 11.9 % (ref 0.0–14.0)
NEUT#: 2.8 10*3/uL (ref 1.5–6.5)
NEUT%: 55.1 % (ref 38.4–76.8)
Platelets: 243 10*3/uL (ref 145–400)
RBC: 4.11 10*6/uL (ref 3.70–5.45)
RDW: 13.9 % (ref 11.2–14.5)
WBC: 5 10*3/uL (ref 3.9–10.3)
lymph#: 1.3 10*3/uL (ref 0.9–3.3)

## 2014-04-27 NOTE — Addendum Note (Signed)
Addended by: Marcelino Duster on: 04/27/2014 05:37 PM   Modules accepted: Orders

## 2014-04-27 NOTE — Telephone Encounter (Signed)
Appointment s made and avs printed for pt  Shannon [name]

## 2014-04-27 NOTE — Progress Notes (Signed)
ID: Shannon [name]   DOB: January 23, 1946  MR#: [id number]  CSN#:[id number]  PCP: Penni Homans, MD GYN: SU:  OTHER MD: Gaynelle Arabian  CHIEF COMPLAINT: right breast cancer CURRENT TREATMENT: none, observation   BREAST CANCER HISTORY: Shannon [name] had a negative screening mammogram in July of 2010.  About a month ago though she felt a change in the lower aspect of the right breast and brought this to Dr. Dickie La attention.  He set her up for a diagnostic mammography on April 2011 and Dr. Mariea Clonts found an interval mass with ill defined margins, deep in the lower, inner right breast.  This was palpable at the 3:30 position, approximately 9 cm from the nipple.  There were no palpable right axillary lymph nodes and by ultrasound there were no abnormal lymph nodes in the right axilla.  The ultrasound did show a 1.7 cm, irregular hypoechoic mass, just beneath the skin.  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  The tumor cells were estrogen receptor and progesterone receptor negative, both at 0%.  The MIB-1 was elevated at 57% and the HER2/neu ratio by CISH was positive at 2.74. Her subsequent history is as detailed below  INTERVAL HISTORY: Shannon [name] returns today for follow up of her breast cancer. The interval history is remarkable for a cardiac cath. This went well with no incident. She slipped and fell at home and injured a few vertebrae. She is planning to have a breast reduction to ease this pain before she considers back surgery. She continues to take care of her disabled son who originally had high functioning down syndrome, but is now batting dementia. He is followed by Dr. Lavone Neri at [facility] and they are happy with his care. Since her last visit, she states his dementia has progressed, but she has no plans to put him in a group home.  REVIEW OF SYSTEMS: Shannon [name] denies fevers,  chills, nausea, vomiting, or changes in bowel or bladder habits. She has no shortness of breath, chest pain, cough, or palpitations. She continues to use klonopin to sleep at night. She denies hot flashes. She takes gabapentin daily for her residual neuropathy symptoms. She has dry eyes and occasional headaches, but no dizziness. She participates in water aerobics daily. A detailed review of systems is otherwise noncontributory.   PAST MEDICAL HISTORY: Past Medical History  Diagnosis Date  . NICM (nonischemic cardiomyopathy) 03/08/2010    likely 2/2 chemotx - a. Echo 2012: EF 45-50%;  b. Lex MV 2/12:  low risk, apical defect (small area of ischemia vs shifting breast atten);  c.  Echo 7/12: Normal wall thickness, EF 60-65%, normal wall motion, grade 1 diastolic dysfunction, mild LAE, PASP 32;   d. Lex MV 11/13:  EF 76%, no ischemia  . GERD 08/11/2008  . HYPERLIPIDEMIA 08/11/2008  . HYPERTENSION 08/11/2008  . LIPOMA 02/10/2009  . PREDIABETES 06/15/2009  . 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 Carpal tunnel syndrome of left wrist   . Neuropathy   . Interstitial cystitis   . Brain tumor   . DEPRESSION 08/11/2008  . 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 Osteoporosis   . Hyponatremia 08/28/2011  . Anemia 08/28/2011  . History of chicken pox   . H/O measles   . H/O mumps   .  Dermatitis   . OA (osteoarthritis) 09/07/2011  . Melanoma 2008    DR. Ronnald Ramp  . Shortness of breath   . Asthma   . History of blood transfusion   . UTI (lower urinary tract infection) 03/03/2012  . Constipation 03/03/2012  . 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 Allyn Kenner.  We will try to get that report.  She also had a craniopharyngioma removed by Dr. Jovita Gamma about 11 years ago.  She required no radiation for this (she presented with  headaches and amaurosis of the right eye).  She has just undergone carpal tunnel release on the left by Dr. Theodis Sato and is recovering well from that.  She had a lipoma removed from her right thigh by Dr. Margot Chimes.  She has a history of cholecystectomy, a history of prior breast cyst removal, a history of interstitial cystitis, followed by Dr. Amalia Hailey, history of hypertension, history of hypercholesterolemia, history of mild GERD/HH, history of childhood asthma, resolved, history of osteoarthritis involving both knees followed by Dr. Gaynelle Arabian and history of osteopenias noted on bone density obtained 2009.  PAST SURGICAL HISTORY: Past Surgical History  Procedure Laterality Date  . Cholecystectomy    . Carpal tunnel release      L wrist, ulnar nerve moved  . Elbow surgery      left  . Tubal ligation  1997  . Lipoma excision  03/28/2009    right leg  . Portacath placement  may 2011  . Craniotomy for tumor  2000  . Tonsillectomy  1958  . Melanoma excision    . [place] cath    . Port-a-cath removal  11/30/2010    Streck  . Total knee raplacement  01-2012    Right Knee  . Total knee arthroplasty  02/05/2012    Procedure: TOTAL KNEE ARTHROPLASTY;  Surgeon: Gearlean Alf, MD;  Location: [facility];  Service: Orthopedics;  Laterality: Right;  . Total knee arthroplasty Left 02/10/2013    Procedure: LEFT TOTAL KNEE ARTHROPLASTY;  Surgeon: Gearlean Alf, MD;  Location: [facility];  Service: Orthopedics;  Laterality: Left;  . Left heart catheterization with coronary angiogram N/A 12/16/2013    Procedure: LEFT HEART CATHETERIZATION WITH CORONARY ANGIOGRAM;  Surgeon: Peter M [name], MD;  Location: [facility] CATH LAB;  Service: Cardiovascular;  Laterality: N/A;  . Breast lumpectomy  04/2009    RIGHT FOR BREAST CANCER-CHEMO/RADIATION X 1 YEAR    FAMILY HISTORY Family History  Problem Relation Age of Onset  . Breast cancer Mother   . Lung cancer Mother   . Breast cancer Mother     sarcoma  . Hypertension  Mother   . Colon cancer Neg Hx   . Prostate cancer Father   . Congestive Heart Failure Father   . Prostate cancer Father   . Prostate cancer Brother   . Prostate cancer Brother   . Stomach cancer Maternal Aunt   . Uterine cancer Maternal Aunt   . Down syndrome Son   . Heart disease Maternal Grandfather     MI  . Heart attack Father   The patient's father is alive at age 25.  He lives in a residential home, but the patient consider herself as his primary caregiver.  The patient's mother had breast cancer diagnosed at the age of 68.  She developed lower extremity sarcoma 10 years later, which eventually took her life.  The patient has one brother and one sister both in good health.  GYNECOLOGIC HISTORY: She is P3, first pregnancy to term age 84.  Change of life around age 30.  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  Her husband Deidre Ala is an Physicist, medical.  Daughter Adonis Brook is a Marine scientist at [employer] in [place].  Son Shannon [name]  works as an Chief Financial Officer, actually not with his dad.  The third child is Shannon [name], lives at home, has Down's syndrome.  The patient attends [employer]   ADVANCED DIRECTIVES: In place  HEALTH MAINTENANCE: History  Substance Use Topics  . Smoking status: Never Smoker   . Smokeless tobacco: Never Used  . Alcohol Use: No     Colonoscopy:  PAP:  Bone density:  Lipid panel:  Allergies  Allergen Reactions  . Dilaudid [Hydromorphone Hcl] Itching    Current Outpatient Prescriptions  Medication Sig Dispense Refill  . alendronate (FOSAMAX) 70 MG tablet Take 1 tablet (70 mg total) by mouth every 7 (seven) days. Take with a full glass of water on an empty stomach. 4 tablet 11  . B Complex-C (SUPER B COMPLEX PO) Take 1 tablet by mouth daily.    . carvedilol (COREG) 6.25 MG  tablet TAKE 1 TABLET (6.25 MG TOTAL) BY MOUTH 2 (TWO) TIMES DAILY WITH A MEAL. 60 tablet 2  . Cholecalciferol 2000 UNITS CAPS Take 2,000 Units by mouth daily.    . clonazePAM (KLONOPIN) 1 MG tablet TAKE 1/2 TABLET BY MOUTH AT BEDTIME. 30 tablet 0  . Ferrous Fumarate 324 MG TABS Take 1 tablet (106 mg of iron total) by mouth daily. 30 each 4  . folic acid (FOLVITE) 1 MG tablet TAKE 1 TABLET BY MOUTH EVERY DAY 30 tablet 5  . gabapentin (NEURONTIN) 300 MG capsule Take 300 mg by mouth daily.     . hydrOXYzine (ATARAX/VISTARIL) 25 MG tablet Take 25 mg by mouth at bedtime.    . Multiple Vitamin (MULTIVITAMIN WITH MINERALS) TABS tablet Take 1 tablet by mouth daily.    . pentosan polysulfate (ELMIRON) 100 MG capsule Take 200 mg by mouth 2 (two) times daily.    . ranitidine (ZANTAC) 150 MG tablet Take 150 mg by mouth 2 (two) times daily as needed for heartburn.     . rosuvastatin (CRESTOR) 20 MG tablet Take 1 tablet (20 mg total) by mouth at bedtime. 30 tablet 5  . telmisartan (MICARDIS) 80 MG tablet TAKE 1 TABLET (80 MG TOTAL) BY MOUTH DAILY. 30 tablet 1  . venlafaxine XR (EFFEXOR-XR) 150 MG 24 hr capsule TAKE ONE CAPSULE BY MOUTH EVERY DAY (Patient taking differently: TAKE ONE CAPSULE BY MOUTH EVENING) 30 capsule 5  . aspirin 81 MG tablet Take 81 mg by mouth daily.    Marland Kitchen nystatin (MYCOSTATIN) powder Apply topically 4 (four) times daily as needed. Itching    . psyllium (METAMUCIL SMOOTH TEXTURE) 28 % packet Take 1 packet by mouth daily as needed (constipation).     No current facility-administered medications for this visit.    OBJECTIVE: Middle-aged white woman who appears stated age   32 Vitals:   04/27/14 1251  BP: 135/63  Pulse: 88  Temp: 97.7 F (36.5 C)  Resp: 18     Body mass index is 36.5 kg/(m^2).    ECOG FS: 1  Filed Weights   04/27/14 1251  Weight: 206 lb (93.441 kg)   Skin: warm, dry  HEENT: sclerae anicteric, conjunctivae pink, oropharynx clear. No thrush or mucositis.  Lymph  Nodes: No cervical or supraclavicular lymphadenopathy  Lungs: clear to auscultation bilaterally, no rales, wheezes, or rhonci  Heart: regular rate and rhythm  Abdomen: round, soft, non tender, positive bowel sounds  Musculoskeletal: moderate kyphosis, no peripheral edema  Neuro: non focal, well oriented, anxious affect  Breasts: right breast status post lumpectomy and radiation. No recurrent disease. Right axilla benign. Left breast unremarkable.   LAB RESULTS: Lab Results  Component Value Date   WBC 5.0 04/27/2014   NEUTROABS 2.8 04/27/2014   HGB 12.2 04/27/2014   HCT 37.4 04/27/2014   MCV 91.0 04/27/2014   PLT 243 04/27/2014      Chemistry      Component Value Date/Time   NA 142 04/27/2014 1240   NA 142 12/09/2013 1050   NA 144 10/20/2010 0854   K 4.4 04/27/2014 1240   K 4.8 12/09/2013 1050   K 4.2 10/20/2010 0854   CL 105 12/09/2013 1050   CL 107 04/08/2012 1018   CL 108 10/20/2010 0854   CO2 26 04/27/2014 1240   CO2 29 12/09/2013 1050   CO2 26 10/20/2010 0854   BUN 11.1 04/27/2014 1240   BUN 17 12/09/2013 1050   BUN 13 10/20/2010 0854   CREATININE 0.9 04/27/2014 1240   CREATININE 0.88 12/09/2013 1050   CREATININE 0.9 10/10/2013 0940      Component Value Date/Time   CALCIUM 9.7 04/27/2014 1240   CALCIUM 9.1 12/09/2013 1050   CALCIUM 9.3 03/08/2011 1447   CALCIUM 8.8 10/20/2010 0854   ALKPHOS 86 04/27/2014 1240   ALKPHOS 92 12/09/2013 1050   ALKPHOS 106* 10/20/2010 0854   AST 28 04/27/2014 1240   AST 25 12/09/2013 1050   AST 32 10/20/2010 0854   ALT 31 04/27/2014 1240   ALT 25 12/09/2013 1050   ALT 31 10/20/2010 0854   BILITOT 0.44 04/27/2014 1240   BILITOT 0.4 12/09/2013 1050   BILITOT 0.50 10/20/2010 0854       Lab Results  Component Value Date   LABCA2 19 10/03/2011     STUDIES: No results found.. Most recent mammogram on 08/15/13 was unremarkable.   ASSESSMENT: 69 y.o. [facility] woman status post right breast biopsy April 2011 for a  clinical T2 N0 grade 3 invasive ductal carcinoma which was estrogen and progesterone receptor negative, HER-2/neu positive with an MIB-1 of 58%  (1) treated neoadjuvantly with multiple agents (due to tolerance issues):  Namely 2 cycles of standard dose paclitaxel/ carboplatin/ trastuzumab followed by 3 cycles of paclitaxel/ trastuzumab given weekly, the last of this given with weekly carboplatin, followed by 2 cycles of dose dense doxorubicin and cyclophosphamide with the doxorubicin given by continuous infusion all of this completed in August 2011  (2) status post definitive right lumpectomy and sentinel lymph node sampling September 2011 showing a complete pathologic response both in the breast and sampled lymph nodes.    (3) completed radiation therapy November 2011.    (4) continued to receive trastuzumab until January 2012 at which time it was discontinued secondary to a drop in her ejection fraction which has now completely resolved.   PLAN:  Shannon [name] continues to do well as far as her breast cancer is concerned. She is now 5 years out from her definitive surgery with no evidence of recurrent disease. The labs were reviewed  in detail and were entirely stable. She is excited about her breast reduction surgery coming up.  Shannon [name] could certainly graduate from follow up visits at this time, but she prefers to continue to be checked up here and requests q6 month visits. She is due for a mammogram in July. She will return this Fall for possibly one last visit with Dr. Jana Hakim. She understands and agrees with this plan. She has been encouraged to call with any issues that might arise before her next visit hers.    Genelle Gather Shannon [name]    04/27/2014

## 2014-04-29 ENCOUNTER — Telehealth: Payer: Self-pay | Admitting: Cardiology

## 2014-04-29 NOTE — Telephone Encounter (Signed)
Patient brought [employer] -Attending Physician Statement for Dr [name] to complete.  Sent signed ROI/Check Pmt for [employer] and [employer] Attending Physician Statement form to [employer] @ [facility] to process.  lp

## 2014-05-04 ENCOUNTER — Ambulatory Visit (HOSPITAL_BASED_OUTPATIENT_CLINIC_OR_DEPARTMENT_OTHER): Payer: Medicare Other | Admitting: Anesthesiology

## 2014-05-04 ENCOUNTER — Ambulatory Visit: Payer: Medicare Other | Admitting: Nurse Practitioner

## 2014-05-04 ENCOUNTER — Encounter (HOSPITAL_BASED_OUTPATIENT_CLINIC_OR_DEPARTMENT_OTHER): Payer: Self-pay | Admitting: Anesthesiology

## 2014-05-04 ENCOUNTER — Other Ambulatory Visit: Payer: Medicare Other

## 2014-05-04 ENCOUNTER — Encounter (HOSPITAL_BASED_OUTPATIENT_CLINIC_OR_DEPARTMENT_OTHER)
Admission: RE | Disposition: A | Discharge status: Home / Self Care | Payer: Self-pay | Source: Ambulatory Visit | Attending: Specialist

## 2014-05-04 ENCOUNTER — Ambulatory Visit (HOSPITAL_BASED_OUTPATIENT_CLINIC_OR_DEPARTMENT_OTHER)
Admission: RE | Admit: 2014-05-04 | Discharge: 2014-05-04 | Disposition: A | Discharge status: Home / Self Care | Payer: Medicare Other | Source: Ambulatory Visit | Attending: Specialist | Admitting: Specialist

## 2014-05-04 ENCOUNTER — Ambulatory Visit: Payer: Medicare Other | Admitting: Oncology

## 2014-05-04 DIAGNOSIS — N6031 Fibrosclerosis of right breast: Secondary | ICD-10-CM | POA: Diagnosis not present

## 2014-05-04 DIAGNOSIS — K219 Gastro-esophageal reflux disease without esophagitis: Secondary | ICD-10-CM | POA: Insufficient documentation

## 2014-05-04 DIAGNOSIS — Z96653 Presence of artificial knee joint, bilateral: Secondary | ICD-10-CM | POA: Insufficient documentation

## 2014-05-04 DIAGNOSIS — D649 Anemia, unspecified: Secondary | ICD-10-CM | POA: Insufficient documentation

## 2014-05-04 DIAGNOSIS — L821 Other seborrheic keratosis: Secondary | ICD-10-CM | POA: Insufficient documentation

## 2014-05-04 DIAGNOSIS — Z7983 Long term (current) use of bisphosphonates: Secondary | ICD-10-CM | POA: Diagnosis not present

## 2014-05-04 DIAGNOSIS — M542 Cervicalgia: Secondary | ICD-10-CM | POA: Diagnosis not present

## 2014-05-04 DIAGNOSIS — Z79899 Other long term (current) drug therapy: Secondary | ICD-10-CM | POA: Insufficient documentation

## 2014-05-04 DIAGNOSIS — J45909 Unspecified asthma, uncomplicated: Secondary | ICD-10-CM | POA: Diagnosis not present

## 2014-05-04 DIAGNOSIS — I10 Essential (primary) hypertension: Secondary | ICD-10-CM | POA: Insufficient documentation

## 2014-05-04 DIAGNOSIS — M810 Age-related osteoporosis without current pathological fracture: Secondary | ICD-10-CM | POA: Insufficient documentation

## 2014-05-04 DIAGNOSIS — Z853 Personal history of malignant neoplasm of breast: Secondary | ICD-10-CM | POA: Insufficient documentation

## 2014-05-04 DIAGNOSIS — N62 Hypertrophy of breast: Secondary | ICD-10-CM | POA: Diagnosis not present

## 2014-05-04 DIAGNOSIS — G629 Polyneuropathy, unspecified: Secondary | ICD-10-CM | POA: Insufficient documentation

## 2014-05-04 DIAGNOSIS — Z86011 Personal history of benign neoplasm of the brain: Secondary | ICD-10-CM | POA: Insufficient documentation

## 2014-05-04 DIAGNOSIS — L905 Scar conditions and fibrosis of skin: Secondary | ICD-10-CM | POA: Diagnosis not present

## 2014-05-04 DIAGNOSIS — E785 Hyperlipidemia, unspecified: Secondary | ICD-10-CM | POA: Insufficient documentation

## 2014-05-04 DIAGNOSIS — Z7982 Long term (current) use of aspirin: Secondary | ICD-10-CM | POA: Insufficient documentation

## 2014-05-04 DIAGNOSIS — M546 Pain in thoracic spine: Secondary | ICD-10-CM | POA: Diagnosis not present

## 2014-05-04 DIAGNOSIS — F329 Major depressive disorder, single episode, unspecified: Secondary | ICD-10-CM | POA: Insufficient documentation

## 2014-05-04 HISTORY — PX: BREAST REDUCTION SURGERY: SHX8

## 2014-05-04 SURGERY — MAMMOPLASTY, REDUCTION
Anesthesia: General | Site: Breast | Laterality: Bilateral

## 2014-05-04 MED ORDER — EPINEPHRINE HCL 1 MG/ML IJ SOLN
INTRAMUSCULAR | Status: AC
  Filled 2014-05-04: qty 1

## 2014-05-04 MED ORDER — FENTANYL CITRATE 0.05 MG/ML IJ SOLN
25.0000 ug | INTRAMUSCULAR | Status: DC | PRN
  Administered 2014-05-04: 12:00:00 via INTRAVENOUS
  Administered 2014-05-04 (×2): 25 ug via INTRAVENOUS

## 2014-05-04 MED ORDER — OXYCODONE HCL 5 MG PO TABS
ORAL_TABLET | ORAL | Status: AC
  Filled 2014-05-04: qty 1

## 2014-05-04 MED ORDER — FENTANYL CITRATE 0.05 MG/ML IJ SOLN
INTRAMUSCULAR | Status: DC | PRN
  Administered 2014-05-04: 100 ug via INTRAVENOUS

## 2014-05-04 MED ORDER — FENTANYL CITRATE 0.05 MG/ML IJ SOLN
INTRAMUSCULAR | Status: AC
  Filled 2014-05-04: qty 2

## 2014-05-04 MED ORDER — PHENYLEPHRINE HCL 10 MG/ML IJ SOLN
10.0000 mg | INTRAVENOUS | Status: DC | PRN
  Administered 2014-05-04: 50 ug/min via INTRAVENOUS

## 2014-05-04 MED ORDER — MIDAZOLAM HCL 5 MG/5ML IJ SOLN
INTRAMUSCULAR | Status: DC | PRN
  Administered 2014-05-04: 1 mg via INTRAVENOUS

## 2014-05-04 MED ORDER — MORPHINE SULFATE 10 MG/ML IJ SOLN
INTRAMUSCULAR | Status: AC
  Filled 2014-05-04: qty 1

## 2014-05-04 MED ORDER — CEFAZOLIN SODIUM-DEXTROSE 2-3 GM-% IV SOLR
2.0000 g | INTRAVENOUS | Status: AC
  Administered 2014-05-04: 2 g via INTRAVENOUS

## 2014-05-04 MED ORDER — LIDOCAINE HCL (CARDIAC) 20 MG/ML IV SOLN
INTRAVENOUS | Status: DC | PRN
  Administered 2014-05-04: 50 mg via INTRAVENOUS

## 2014-05-04 MED ORDER — OXYCODONE HCL 5 MG PO TABS
5.0000 mg | ORAL_TABLET | Freq: Once | ORAL | Status: AC
  Administered 2014-05-04: 5 mg via ORAL

## 2014-05-04 MED ORDER — ONDANSETRON HCL 4 MG/2ML IJ SOLN
INTRAMUSCULAR | Status: DC | PRN
  Administered 2014-05-04: 4 mg via INTRAVENOUS

## 2014-05-04 MED ORDER — SODIUM CHLORIDE 0.9 % IV SOLN
INTRAVENOUS | Status: DC | PRN
  Administered 2014-05-04: 400 mL via INTRAMUSCULAR

## 2014-05-04 MED ORDER — CEFAZOLIN SODIUM-DEXTROSE 2-3 GM-% IV SOLR
INTRAVENOUS | Status: AC
  Filled 2014-05-04: qty 50

## 2014-05-04 MED ORDER — PROPOFOL 10 MG/ML IV BOLUS
INTRAVENOUS | Status: DC | PRN
  Administered 2014-05-04: 150 mg via INTRAVENOUS

## 2014-05-04 MED ORDER — LACTATED RINGERS IV SOLN
INTRAVENOUS | Status: DC
  Administered 2014-05-04: 07:00:00 via INTRAVENOUS

## 2014-05-04 MED ORDER — DEXAMETHASONE SODIUM PHOSPHATE 4 MG/ML IJ SOLN
INTRAMUSCULAR | Status: DC | PRN
  Administered 2014-05-04: 10 mg via INTRAVENOUS

## 2014-05-04 MED ORDER — MIDAZOLAM HCL 2 MG/2ML IJ SOLN
INTRAMUSCULAR | Status: AC
  Filled 2014-05-04: qty 2

## 2014-05-04 MED ORDER — LIDOCAINE-EPINEPHRINE 0.5 %-1:200000 IJ SOLN
INTRAMUSCULAR | Status: AC
  Filled 2014-05-04: qty 1

## 2014-05-04 MED ORDER — LIDOCAINE HCL (PF) 1 % IJ SOLN
INTRAMUSCULAR | Status: AC
  Filled 2014-05-04: qty 60

## 2014-05-04 MED ORDER — BUPIVACAINE-EPINEPHRINE (PF) 0.5% -1:200000 IJ SOLN
INTRAMUSCULAR | Status: AC
  Filled 2014-05-04: qty 150

## 2014-05-04 MED ORDER — MORPHINE SULFATE 10 MG/ML IJ SOLN
INTRAMUSCULAR | Status: DC | PRN
  Administered 2014-05-04: 2 mg via INTRAVENOUS

## 2014-05-04 MED ORDER — EPHEDRINE SULFATE 50 MG/ML IJ SOLN
INTRAMUSCULAR | Status: DC | PRN
  Administered 2014-05-04: 15 mg via INTRAVENOUS
  Administered 2014-05-04: 10 mg via INTRAVENOUS

## 2014-05-04 MED ORDER — SUCCINYLCHOLINE CHLORIDE 20 MG/ML IJ SOLN
INTRAMUSCULAR | Status: DC | PRN
  Administered 2014-05-04: 100 mg via INTRAVENOUS

## 2014-05-04 MED ORDER — FENTANYL CITRATE 0.05 MG/ML IJ SOLN: 50.0000 ug | INTRAMUSCULAR | Status: DC | PRN

## 2014-05-04 MED ORDER — PROMETHAZINE HCL 25 MG/ML IJ SOLN: 6.2500 mg | INTRAMUSCULAR | Status: DC | PRN

## 2014-05-04 MED ORDER — MIDAZOLAM HCL 2 MG/2ML IJ SOLN: 1.0000 mg | INTRAMUSCULAR | Status: DC | PRN

## 2014-05-04 SURGICAL SUPPLY — 92 items
APL SKNCLS STERI-STRIP NONHPOA (GAUZE/BANDAGES/DRESSINGS) ×4
BAG DECANTER FOR FLEXI CONT (MISCELLANEOUS) ×3 IMPLANT
BENZOIN TINCTURE PRP APPL 2/3 (GAUZE/BANDAGES/DRESSINGS) ×6 IMPLANT
BINDER BREAST LRG (GAUZE/BANDAGES/DRESSINGS) IMPLANT
BINDER BREAST XLRG (GAUZE/BANDAGES/DRESSINGS) ×2 IMPLANT
BINDER BREAST XXLRG (GAUZE/BANDAGES/DRESSINGS) IMPLANT
BLADE HEX COATED 2.75 (ELECTRODE) ×3 IMPLANT
BLADE KNIFE  20 PERSONNA (BLADE) ×4
BLADE KNIFE 20 PERSONNA (BLADE) ×6 IMPLANT
BLADE KNIFE PERSONA 10 (BLADE) ×12 IMPLANT
BLADE KNIFE PERSONA 15 (BLADE) ×3 IMPLANT
CANISTER LIPO FAT HARVEST (MISCELLANEOUS) IMPLANT
CANISTER SUCT 1200ML W/VALVE (MISCELLANEOUS) ×3 IMPLANT
COVER BACK TABLE 60X90IN (DRAPES) ×3 IMPLANT
COVER MAYO STAND STRL (DRAPES) ×3 IMPLANT
DECANTER SPIKE VIAL GLASS SM (MISCELLANEOUS) ×2 IMPLANT
DRAIN CHANNEL 10F 3/8 F FF (DRAIN) ×6 IMPLANT
DRAIN CHANNEL 10M FLAT 3/4 FLT (DRAIN) ×4 IMPLANT
DRAIN CHANNEL 7F 3/4 FLAT (WOUND CARE) IMPLANT
DRAIN PENROSE 1/2X12 LTX STRL (WOUND CARE) IMPLANT
DRAPE LAPAROSCOPIC ABDOMINAL (DRAPES) ×3 IMPLANT
DRAPE UTILITY XL STRL (DRAPES) ×3 IMPLANT
DRSG PAD ABDOMINAL 8X10 ST (GAUZE/BANDAGES/DRESSINGS) ×12 IMPLANT
ELECT BLADE 6.5 .24CM SHAFT (ELECTRODE) ×3 IMPLANT
ELECT REM PT RETURN 9FT ADLT (ELECTROSURGICAL) ×3
ELECTRODE REM PT RTRN 9FT ADLT (ELECTROSURGICAL) ×2 IMPLANT
EVACUATOR SILICONE 100CC (DRAIN) ×6 IMPLANT
FILTER 7/8 IN (FILTER) ×3 IMPLANT
FILTER LIPOSUCTION (MISCELLANEOUS) IMPLANT
GAUZE SPONGE 4X4 12PLY STRL (GAUZE/BANDAGES/DRESSINGS) ×6 IMPLANT
GAUZE XEROFORM 5X9 LF (GAUZE/BANDAGES/DRESSINGS) ×6 IMPLANT
GLOVE BIO SURGEON STRL SZ 6.5 (GLOVE) ×3 IMPLANT
GLOVE BIO SURGEON STRL SZ7 (GLOVE) ×2 IMPLANT
GLOVE BIOGEL M STRL SZ7.5 (GLOVE) ×3 IMPLANT
GLOVE BIOGEL PI IND STRL 8 (GLOVE) ×2 IMPLANT
GLOVE BIOGEL PI INDICATOR 8 (GLOVE) ×1
GLOVE ECLIPSE 7.0 STRL STRAW (GLOVE) ×3 IMPLANT
GOWN STRL REUS W/ TWL LRG LVL3 (GOWN DISPOSABLE) ×2 IMPLANT
GOWN STRL REUS W/ TWL XL LVL3 (GOWN DISPOSABLE) ×2 IMPLANT
GOWN STRL REUS W/TWL LRG LVL3 (GOWN DISPOSABLE) ×3
GOWN STRL REUS W/TWL XL LVL3 (GOWN DISPOSABLE) ×3
IV LACTATED RINGERS 1000ML (IV SOLUTION) IMPLANT
IV NS 500ML (IV SOLUTION) ×6
IV NS 500ML BAXH (IV SOLUTION) ×4 IMPLANT
KIT FILL SYSTEM UNIVERSAL (SET/KITS/TRAYS/PACK) IMPLANT
LINER CANISTER 1000CC FLEX (MISCELLANEOUS) IMPLANT
NDL HYPO 25X1 1.5 SAFETY (NEEDLE) ×1 IMPLANT
NDL SAFETY ECLIPSE 18X1.5 (NEEDLE) IMPLANT
NDL SPNL 18GX3.5 QUINCKE PK (NEEDLE) ×1 IMPLANT
NEEDLE HYPO 18GX1.5 SHARP (NEEDLE)
NEEDLE HYPO 25X1 1.5 SAFETY (NEEDLE) ×3 IMPLANT
NEEDLE SPNL 18GX3.5 QUINCKE PK (NEEDLE) ×3 IMPLANT
NS IRRIG 1000ML POUR BTL (IV SOLUTION) ×7 IMPLANT
PACK BASIN DAY SURGERY FS (CUSTOM PROCEDURE TRAY) ×3 IMPLANT
PAD ALCOHOL SWAB (MISCELLANEOUS) IMPLANT
PEN SKIN MARKING BROAD TIP (MISCELLANEOUS) ×5 IMPLANT
PENCIL BUTTON HOLSTER BLD 10FT (ELECTRODE) ×3 IMPLANT
PIN SAFETY STERILE (MISCELLANEOUS) ×3 IMPLANT
SHEETING SILICONE GEL EPI DERM (MISCELLANEOUS) IMPLANT
SLEEVE SCD COMPRESS KNEE MED (MISCELLANEOUS) ×3 IMPLANT
SPECIMEN JAR MEDIUM (MISCELLANEOUS) ×6 IMPLANT
SPECIMEN JAR X LARGE (MISCELLANEOUS) ×6 IMPLANT
SPONGE GAUZE 4X4 12PLY STER LF (GAUZE/BANDAGES/DRESSINGS) IMPLANT
SPONGE LAP 18X18 X RAY DECT (DISPOSABLE) ×12 IMPLANT
STRIP SUTURE WOUND CLOSURE 1/2 (SUTURE) ×15 IMPLANT
SUT ETHILON 3 0 PS 1 (SUTURE) IMPLANT
SUT MNCRL AB 3-0 PS2 18 (SUTURE) ×22 IMPLANT
SUT MON AB 2-0 CT1 36 (SUTURE) IMPLANT
SUT MON AB 5-0 PS2 18 (SUTURE) ×6 IMPLANT
SUT PROLENE 3 0 PS 2 (SUTURE) ×18 IMPLANT
SUT PROLENE 4 0 PS 2 18 (SUTURE) ×2 IMPLANT
SUT VIC AB 2-0 CT1 27 (SUTURE)
SUT VIC AB 2-0 CT1 TAPERPNT 27 (SUTURE) IMPLANT
SUT VIC AB 3-0 SH 27 (SUTURE)
SUT VIC AB 3-0 SH 27X BRD (SUTURE) IMPLANT
SYR 20CC LL (SYRINGE) ×3 IMPLANT
SYR 50ML LL SCALE MARK (SYRINGE) ×6 IMPLANT
SYR BULB IRRIGATION 50ML (SYRINGE) IMPLANT
SYR CONTROL 10ML LL (SYRINGE) IMPLANT
TAPE HYPAFIX 6X30 (GAUZE/BANDAGES/DRESSINGS) ×3 IMPLANT
TAPE MEASURE 72IN RETRACT (INSTRUMENTS)
TAPE MEASURE LINEN 72IN RETRCT (INSTRUMENTS) IMPLANT
TAPE PAPER 1X10 WHT MICROPORE (GAUZE/BANDAGES/DRESSINGS) ×3 IMPLANT
TOWEL OR 17X24 6PK STRL BLUE (TOWEL DISPOSABLE) ×12 IMPLANT
TOWEL OR NON WOVEN STRL DISP B (DISPOSABLE) ×3 IMPLANT
TRAY DSU PREP LF (CUSTOM PROCEDURE TRAY) ×6 IMPLANT
TUBE CONNECTING 20X1/4 (TUBING) ×6 IMPLANT
TUBING INFILTRATION IT-10001 (TUBING) IMPLANT
TUBING SET GRADUATE ASPIR 12FT (MISCELLANEOUS) IMPLANT
UNDERPAD 30X30 INCONTINENT (UNDERPADS AND DIAPERS) ×3 IMPLANT
VAC PENCILS W/TUBING CLEAR (MISCELLANEOUS) ×3 IMPLANT
YANKAUER SUCT BULB TIP NO VENT (SUCTIONS) ×3 IMPLANT

## 2014-05-04 NOTE — Transfer of Care (Signed)
Immediate Anesthesia Transfer of Care Note  Patient: Shannon [name]  Procedure(s) Performed: Procedure(s): MAMMARY REDUCTION  (BREAST) (Bilateral)  Patient Location: PACU  Anesthesia Type:General  Level of Consciousness: awake and sedated  Airway & Oxygen Therapy: Patient Spontanous Breathing and Patient connected to face mask oxygen  Post-op Assessment: Report given to RN and Post -op Vital signs reviewed and stable  Post vital signs: Reviewed and stable  Last Vitals:  Filed Vitals:   05/04/14 1012  BP: 113/54  Pulse: 87  Temp:   Resp:     Complications: No apparent anesthesia complications

## 2014-05-04 NOTE — Anesthesia Preprocedure Evaluation (Signed)
Anesthesia Evaluation  Patient identified by MRN, date of birth, ID band Patient awake    Reviewed: Allergy & Precautions, Patient's Chart, lab work & pertinent test results  Airway Mallampati: II  TM Distance: >3 FB Neck ROM: Full    Dental   Pulmonary shortness of breath, asthma ,          Cardiovascular hypertension, Rhythm:Regular Rate:Normal     Neuro/Psych    GI/Hepatic Neg liver ROS, GERD-  ,  Endo/Other  negative endocrine ROS  Renal/GU negative Renal ROS     Musculoskeletal   Abdominal   Peds  Hematology   Anesthesia Other Findings   Reproductive/Obstetrics                             Anesthesia Physical Anesthesia Plan  ASA: II  Anesthesia Plan: General   Post-op Pain Management:    Induction: Intravenous  Airway Management Planned: Oral ETT  Additional Equipment:   Intra-op Plan:   Post-operative Plan: Extubation in OR  Informed Consent: I have reviewed the patients History and Physical, chart, labs and discussed the procedure including the risks, benefits and alternatives for the proposed anesthesia with the patient or authorized representative who has indicated his/her understanding and acceptance.   Dental advisory given  Plan Discussed with: CRNA and Anesthesiologist  Anesthesia Plan Comments:         Anesthesia Quick Evaluation

## 2014-05-04 NOTE — Anesthesia Procedure Notes (Addendum)
Procedure Name: Intubation Performed by: Terrance Mass Pre-anesthesia Checklist: Patient identified, Timeout performed, Emergency Drugs available, Suction available and Patient being monitored Patient Re-evaluated:Patient Re-evaluated prior to inductionOxygen Delivery Method: Circle system utilized Preoxygenation: Pre-oxygenation with 100% oxygen Intubation Type: IV induction Ventilation: Mask ventilation without difficulty Laryngoscope Size: Miller and 2 Grade View: Grade I Tube type: Oral Tube size: 7.0 mm Number of attempts: 2 Airway Equipment and Method: Stylet and Video-laryngoscopy Placement Confirmation: ETT inserted through vocal cords under direct vision,  positive ETCO2 and breath sounds checked- equal and bilateral Secured at: 22 cm Tube secured with: Tape Dental Injury: Teeth and Oropharynx as per pre-operative assessment    Performed by: Terrance Mass

## 2014-05-04 NOTE — H&P (Signed)
Shannon [name] is an 69 y.o. female.   Chief Complaint: Increased macromastia HPI: Increased back and shoulder pain with severe intertigo  Past Medical History  Diagnosis Date  . NICM (nonischemic cardiomyopathy) 03/08/2010    likely 2/2 chemotx - a. Echo 2012: EF 45-50%;  b. Lex MV 2/12:  low risk, apical defect (small area of ischemia vs shifting breast atten);  c.  Echo 7/12: Normal wall thickness, EF 60-65%, normal wall motion, grade 1 diastolic dysfunction, mild LAE, PASP 32;   d. Lex MV 11/13:  EF 76%, no ischemia  . GERD 08/11/2008  . HYPERLIPIDEMIA 08/11/2008  . HYPERTENSION 08/11/2008  . LIPOMA 02/10/2009  . PREDIABETES 06/15/2009  . 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 Carpal tunnel syndrome of left wrist   . Neuropathy   . Interstitial cystitis   . Brain tumor   . DEPRESSION 08/11/2008  . 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 Osteoporosis   . Hyponatremia 08/28/2011  . Anemia 08/28/2011  . History of chicken pox   . H/O measles   . H/O mumps   . Dermatitis   . OA (osteoarthritis) 09/07/2011  . Melanoma 2008    DR. Ronnald Ramp  . Shortness of breath   . Asthma   . History of blood transfusion   . UTI (lower urinary tract infection) 03/03/2012  . Constipation 03/03/2012  . 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 Cholecystectomy    . Carpal tunnel release      L wrist, ulnar nerve moved  . Elbow surgery      left  . Tubal ligation  1997  . Lipoma excision  03/28/2009    right leg  . Portacath placement  may 2011  . Craniotomy for tumor  2000  . Tonsillectomy  1958  . Melanoma excision    . [place] cath    . Port-a-cath removal  11/30/2010    Streck  . Total knee raplacement  01-2012    Right Knee  . Total knee arthroplasty  02/05/2012    Procedure: TOTAL KNEE ARTHROPLASTY;  Surgeon: Gearlean Alf, MD;  Location: [facility];  Service: Orthopedics;   Laterality: Right;  . Total knee arthroplasty Left 02/10/2013    Procedure: LEFT TOTAL KNEE ARTHROPLASTY;  Surgeon: Gearlean Alf, MD;  Location: [facility];  Service: Orthopedics;  Laterality: Left;  . Left heart catheterization with coronary angiogram N/A 12/16/2013    Procedure: LEFT HEART CATHETERIZATION WITH CORONARY ANGIOGRAM;  Surgeon: Peter M [name], MD;  Location[facility] CATH LAB;  Service: Cardiovascular;  Laterality: N/A;  . Breast lumpectomy  04/2009    RIGHT FOR BREAST CANCER-CHEMO/RADIATION X 1 YEAR    Family History  Problem Relation Age of Onset  . Breast cancer Mother   . Lung cancer Mother   . Breast cancer Mother     sarcoma  . Hypertension Mother   . Colon cancer Neg Hx   . Prostate cancer Father   . Congestive Heart Failure Father   . Prostate cancer Father   . Prostate cancer Brother   . Prostate cancer Brother   . Stomach cancer Maternal Aunt   . Uterine cancer Maternal Aunt   . Down syndrome Son   . Heart disease Maternal Grandfather     MI  . Heart attack Father  Social History:  reports that she has never smoked. She has never used smokeless tobacco. She reports that she does not drink alcohol or use illicit drugs.  Allergies:  Allergies  Allergen Reactions  . Dilaudid [Hydromorphone Hcl] Itching    Medications Prior to Admission  Medication Sig Dispense Refill  . alendronate (FOSAMAX) 70 MG tablet Take 1 tablet (70 mg total) by mouth every 7 (seven) days. Take with a full glass of water on an empty stomach. 4 tablet 11  . aspirin 81 MG tablet Take 81 mg by mouth daily.    . B Complex-C (SUPER B COMPLEX PO) Take 1 tablet by mouth daily.    . carvedilol (COREG) 6.25 MG tablet TAKE 1 TABLET (6.25 MG TOTAL) BY MOUTH 2 (TWO) TIMES DAILY WITH A MEAL. 60 tablet 2  . Cholecalciferol 2000 UNITS CAPS Take 2,000 Units by mouth daily.    . clonazePAM (KLONOPIN) 1 MG tablet TAKE 1/2 TABLET BY MOUTH AT BEDTIME. 30 tablet 0  . Ferrous Fumarate 324 MG TABS Take 1  tablet (106 mg of iron total) by mouth daily. 30 each 4  . folic acid (FOLVITE) 1 MG tablet TAKE 1 TABLET BY MOUTH EVERY DAY 30 tablet 5  . gabapentin (NEURONTIN) 300 MG capsule Take 300 mg by mouth daily.     . hydrOXYzine (ATARAX/VISTARIL) 25 MG tablet Take 25 mg by mouth at bedtime.    . Multiple Vitamin (MULTIVITAMIN WITH MINERALS) TABS tablet Take 1 tablet by mouth daily.    Marland Kitchen nystatin (MYCOSTATIN) powder Apply topically 4 (four) times daily as needed. Itching    . pentosan polysulfate (ELMIRON) 100 MG capsule Take 200 mg by mouth 2 (two) times daily.    . psyllium (METAMUCIL SMOOTH TEXTURE) 28 % packet Take 1 packet by mouth daily as needed (constipation).    . ranitidine (ZANTAC) 150 MG tablet Take 150 mg by mouth 2 (two) times daily as needed for heartburn.     . rosuvastatin (CRESTOR) 20 MG tablet Take 1 tablet (20 mg total) by mouth at bedtime. 30 tablet 5  . telmisartan (MICARDIS) 80 MG tablet TAKE 1 TABLET (80 MG TOTAL) BY MOUTH DAILY. 30 tablet 1  . venlafaxine XR (EFFEXOR-XR) 150 MG 24 hr capsule TAKE ONE CAPSULE BY MOUTH EVERY DAY (Patient taking differently: TAKE ONE CAPSULE BY MOUTH EVENING) 30 capsule 5    No results found for this or any previous visit (from the past 48 hour(s)). No results found.  Review of Systems  Constitutional: Negative.   Eyes: Negative.   Respiratory: Negative.   Cardiovascular: Negative.   Gastrointestinal: Negative.   Genitourinary: Negative.   Musculoskeletal: Positive for back pain and neck pain.  Skin: Positive for rash.  Neurological: Negative.   Endo/Heme/Allergies: Negative.   Psychiatric/Behavioral: Negative.     Blood pressure 143/89, pulse 79, temperature 98 F (36.7 C), temperature source Oral, resp. rate 20, height '5\' 4"'  (1.626 m), weight 93.101 kg (205 lb 4 oz), last menstrual period 01/30/1994, SpO2 100 %. Physical Exam   Assessment/Plan Severe macromastia for bilateral breast reductions with excision of severe accessory  breast tissue  Shannon [name] L 05/04/2014, 7:27 AM

## 2014-05-04 NOTE — Brief Op Note (Signed)
05/04/2014  9:59 AM  PATIENT:  Jeral Pinch  69 y.o. female  PRE-OPERATIVE DIAGNOSIS:  macromastia  POST-OPERATIVE DIAGNOSIS:  macromastia  PROCEDURE:  Procedure(s): MAMMARY REDUCTION  (BREAST) (Bilateral)  SURGEON:  Surgeon(s) and Role:    * Cristine Polio, MD - Primary  PHYSICIAN ASSISTANT:   ASSISTANTS: none   ANESTHESIA:   general  EBL:  Total I/O In: 1300 [I.V.:1300] Out: -   BLOOD ADMINISTERED:none  DRAINS: (bbb) Jackson-Pratt drain(s) with closed bulb suction in the right and left lateral chest areas   LOCAL MEDICATIONS USED:  LIDOCAINE   SPECIMEN:  Excision  DISPOSITION OF SPECIMEN:  PATHOLOGY  COUNTS:  YES  TOURNIQUET:  * No tourniquets in log *  DICTATION: .Other Dictation: Dictation Number [id number]  PLAN OF CARE: Discharge to home after PACU  PATIENT DISPOSITION:  PACU - hemodynamically stable.   Delay start of Pharmacological VTE agent (>24hrs) due to surgical blood loss or risk of bleeding: yes

## 2014-05-04 NOTE — Discharge Instructions (Signed)
Breast Reduction Care After Refer to this sheet in the next few weeks. These instructions provide you with information on caring for yourself after your procedure. Your caregiver may also give you more specific instructions. Your treatment has been planned according to current medical practices, but problems sometimes occur. Call your caregiver if you have any problems or questions after your procedure.  HOME CARE INSTRUCTIONS 1. Do not lift more than 5 pounds with one arm, or 10 pounds with both arms, for 1 month.  2. Do not sleep on your stomach for 4 to 6 weeks.  3. Do not do vigorous exercise such as bouncing, aerobics, or jumping for 6 weeks. Walking is not restricted.  4. Do not drive while you are taking prescription pain medicine.  5. Avoid prolonged sun exposure.  6. Keep dressings dry and clean 7. Measure jp drainage every 12 hrs and measure  8. You may slowly go back to your normal diet. Start with a light meal and increase as comfortable.  9. You may shower 24 hours after your drains are removed unless instructed differently by your caregiver.  10. Take your pain medicine as prescribed. Discomfort is normal after breast reduction surgery.  11. Keep the head of your bed elevated 40 degrees 12.  : Call the office if you notice:  You have a fever.   You notice drainage from the incision that smells bad.   You have persistent pain.   You have persistent bleeding from the incision or nipple discharge.   You develop increased swelling or swelling that is greater in one breast than in the other.    MAKE SURE YOU:   Understand these instructions.   Will watch your condition.   Will get help right away if you are not doing well or get worse.  Document Released: 08/31/2003 Document Revised: 09/28/2010 Document Reviewed: 04/11/2007 [facility] Patient Information 2012 [place].   Post Anesthesia Home Care Instructions  Activity: Get plenty of rest for the remainder of  the day. A responsible adult should stay with you for 24 hours following the procedure.  For the next 24 hours, DO NOT: -Drive a car -Paediatric nurse -Drink alcoholic beverages -Take any medication unless instructed by your physician -Make any legal decisions or sign important papers.  Meals: Start with liquid foods such as gelatin or soup. Progress to regular foods as tolerated. Avoid greasy, spicy, heavy foods. If nausea and/or vomiting occur, drink only clear liquids until the nausea and/or vomiting subsides. Call your physician if vomiting continues.  Special Instructions/Symptoms: Your throat may feel dry or sore from the anesthesia or the breathing tube placed in your throat during surgery. If this causes discomfort, gargle with warm salt water. The discomfort should disappear within 24 hours.  If you had a scopolamine patch placed behind your ear for the management of post- operative nausea and/or vomiting:  1. The medication in the patch is effective for 72 hours, after which it should be removed.  Wrap patch in a tissue and discard in the trash. Wash hands thoroughly with soap and water. 2. You may remove the patch earlier than 72 hours if you experience unpleasant side effects which may include dry mouth, dizziness or visual disturbances. 3. Avoid touching the patch. Wash your hands with soap and water after contact with the patch.   About my Jackson-Pratt Bulb Drain  What is a Jackson-Pratt bulb? A Jackson-Pratt is a soft, round device used to collect drainage. It is connected to  a long, thin drainage catheter, which is held in place by one or two small stiches near your surgical incision site. When the bulb is squeezed, it forms a vacuum, forcing the drainage to empty into the bulb.  Emptying the Jackson-Pratt bulb- To empty the bulb: 1. Release the plug on the top of the bulb. 2. Pour the bulb's contents into a measuring container which your nurse will provide. 3. Record  the time emptied and amount of drainage. Empty the drain(s) as often as your     doctor or nurse recommends.  Date                  Time                    Amount (Drain 1)                 Amount (Drain 2)  _____________________________________________________________________  _____________________________________________________________________  _____________________________________________________________________  _____________________________________________________________________  _____________________________________________________________________  _____________________________________________________________________  _____________________________________________________________________  _____________________________________________________________________  Squeezing the Jackson-Pratt Bulb- To squeeze the bulb: 1. Make sure the plug at the top of the bulb is open. 2. Squeeze the bulb tightly in your fist. You will hear air squeezing from the bulb. 3. Replace the plug while the bulb is squeezed. 4. Use a safety pin to attach the bulb to your clothing. This will keep the catheter from     pulling at the bulb insertion site.  When to call your doctor- Call your doctor if:  Drain site becomes red, swollen or hot.  You have a fever greater than 101 degrees F.  There is oozing at the drain site.  Drain falls out (apply a guaze bandage over the drain hole and secure it with tape).  Drainage increases daily not related to activity patterns. (You will usually have more drainage when you are active than when you are resting.)  Drainage has a bad odor.

## 2014-05-04 NOTE — Anesthesia Postprocedure Evaluation (Signed)
  Anesthesia Post-op Note  Patient: Shannon [name]  Procedure(s) Performed: Procedure(s): MAMMARY REDUCTION  (BREAST) (Bilateral)  Patient Location: PACU  Anesthesia Type:General  Level of Consciousness: awake  Airway and Oxygen Therapy: Patient Spontanous Breathing  Post-op Pain: mild  Post-op Assessment: Post-op Vital signs reviewed  Post-op Vital Signs: Reviewed  Last Vitals:  Filed Vitals:   05/04/14 1012  BP: 113/54  Pulse: 87  Temp: 36.6 C  Resp: 20    Complications: No apparent anesthesia complications

## 2014-05-05 NOTE — Op Note (Signed)
NAMEMCKINZI, ERIKSEN                 ACCOUNT NO.:  [id number]  MEDICAL RECORD NO.:  [id number]  LOCATION:                                 FACILITY:  PHYSICIAN:  [place] Hazyl Marseille, M.D.DATE OF BIRTH:  Sep 25, 1945  DATE OF PROCEDURE:  05/04/2014 DATE OF DISCHARGE:  05/04/2014                              OPERATIVE REPORT   INDICATIONS:  A 69 year old with severe macromastia, back and shoulder pain secondary to large pendulous breasts with increased accessory breast tissue, transcending from the axillae to the latissimus dorsi areas.  PROCEDURES DONE:  Bilateral breast reductions using the inferior pedicle technique as well as excision of accessory breast tissue.  ANESTHESIA:  General.  DESCRIPTION OF PROCEDURE:  Preoperatively, the patient was sat up and drawn for the reduction mammoplasty.  We marked the nipple-areolar complex from over 30 cm to 22 in an inferior pedicle technique manner. She then underwent general anesthesia, intubated orally without difficulty.  Prep was done to the chest and breast areas in a routine fashion.  Using Hibiclens soap and solution, walled off with sterile towels and drapes, so as to make a sterile field.  Xylocaine 0.25% with epinephrine was injected locally in 1:400,000 concentration with total of 200 mL per side.  After this, was allowed to sit up.  The wounds were scored with #15 blade.  Then, the skin of the inferior pedicle was de- epithelialized with #20 blades.  Medial and lateral fatty dermal pedicles were incised down to underlying fascia on both sides.  The new keyhole area was also debrided and reduced.  After proper hemostasis, flaps were transposed and stayed with 3-0 Prolene suture.  Subcutaneous closure was done with 3-0 Monocryl x2 layers.  Then, a running subcuticular stitch of 3-0 Monocryl and 5-0 Monocryl throughout the inverted T.  The wounds were drained with #10 fully fluted Blake drain which was brought out through the  lateral portion of the incision and secured with 3-0 Prolene.  Same procedure was carried out on both sides. The left side was slightly larger, removal of 770 g.  On the right side, approximately 680.  Steri-Strips soft dressing applied, 4x4s, ABDs, Hypafix tape.  She was then taken to recovery in excellent condition.  ESTIMATED BLOOD LOSS:  150 mL.  COMPLICATIONS:  None.     Odella Aquas. Towanda Malkin, M.D.     Elie Confer  D:  05/04/2014  T:  05/04/2014  Job:  [id number]

## 2014-05-06 ENCOUNTER — Encounter (HOSPITAL_BASED_OUTPATIENT_CLINIC_OR_DEPARTMENT_OTHER): Payer: Self-pay | Admitting: Specialist

## 2014-05-19 DIAGNOSIS — H401230 Low-tension glaucoma, bilateral, stage unspecified: Secondary | ICD-10-CM | POA: Diagnosis not present

## 2014-05-29 ENCOUNTER — Other Ambulatory Visit: Payer: Self-pay | Admitting: Family Medicine

## 2014-06-02 ENCOUNTER — Telehealth: Payer: Self-pay | Admitting: Cardiology

## 2014-06-02 NOTE — Telephone Encounter (Signed)
06/02/2014 Talk with patient on phone she wanted me to mail copy of her [place] form.  I mailed patient a copy.

## 2014-06-15 DIAGNOSIS — H04123 Dry eye syndrome of bilateral lacrimal glands: Secondary | ICD-10-CM | POA: Diagnosis not present

## 2014-06-15 DIAGNOSIS — H40013 Open angle with borderline findings, low risk, bilateral: Secondary | ICD-10-CM | POA: Diagnosis not present

## 2014-06-16 ENCOUNTER — Other Ambulatory Visit: Payer: Self-pay | Admitting: Family Medicine

## 2014-06-16 NOTE — Telephone Encounter (Signed)
Last refill #30 with 0 refills on 04/16/14 Last office visit 10/10/13 No upcoming scheduled visit at this time. Advise on refill

## 2014-06-19 NOTE — Telephone Encounter (Signed)
I will refill Clonazepam for 30 days but she needs to be seen every 6 months if she wants to be able to call in for refills on this controlled medicine with our new guidelines

## 2014-06-19 NOTE — Telephone Encounter (Signed)
[facility].  Patient has been informed.

## 2014-06-19 NOTE — Telephone Encounter (Signed)
Patient scheduled cpe for November.

## 2014-06-19 NOTE — Telephone Encounter (Signed)
[facility]  

## 2014-06-27 ENCOUNTER — Other Ambulatory Visit: Payer: Self-pay | Admitting: Family Medicine

## 2014-07-01 DIAGNOSIS — N62 Hypertrophy of breast: Secondary | ICD-10-CM | POA: Diagnosis not present

## 2014-07-01 DIAGNOSIS — M546 Pain in thoracic spine: Secondary | ICD-10-CM | POA: Diagnosis not present

## 2014-07-01 DIAGNOSIS — M542 Cervicalgia: Secondary | ICD-10-CM | POA: Diagnosis not present

## 2014-07-15 ENCOUNTER — Other Ambulatory Visit: Payer: Self-pay | Admitting: Family Medicine

## 2014-07-17 ENCOUNTER — Other Ambulatory Visit: Payer: Self-pay | Admitting: Family Medicine

## 2014-07-27 ENCOUNTER — Other Ambulatory Visit: Payer: Self-pay

## 2014-07-29 ENCOUNTER — Encounter: Payer: Self-pay | Admitting: Genetic Counselor

## 2014-08-07 ENCOUNTER — Telehealth: Payer: Self-pay | Admitting: *Deleted

## 2014-08-07 NOTE — Telephone Encounter (Signed)
VM message from patient received @ 3:22 pm. Pt states she had breast reduction surgery done in April 2016 and is due for her yearly mammogram. Her surgeon has told her to wait 1 year from time of surgery before having mammogram. Shannon [name] is concerned that it will then be 2 years since last mammogram by then. She is asking if there another alternative as she has had breast cancer and does not want to go that long without imaging study.

## 2014-08-09 ENCOUNTER — Other Ambulatory Visit: Payer: Self-pay | Admitting: Oncology

## 2014-08-09 NOTE — Telephone Encounter (Signed)
She sees me in OCT-- we will discuss [place] then  Thanks!

## 2014-08-11 NOTE — Telephone Encounter (Signed)
TC to patient and informed that Dr. Doris Cheadle will discuss mammogram issues and options with her @ her next appt in Oct. Blanch Media voiced understanding and is good with that plan

## 2014-09-03 ENCOUNTER — Other Ambulatory Visit: Payer: Self-pay | Admitting: Family Medicine

## 2014-09-17 ENCOUNTER — Other Ambulatory Visit: Payer: Self-pay | Admitting: Family Medicine

## 2014-09-25 ENCOUNTER — Other Ambulatory Visit: Payer: Self-pay | Admitting: Family Medicine

## 2014-09-25 NOTE — Telephone Encounter (Signed)
Faxed hardcopy for Clonazepam to [facility]

## 2014-09-28 ENCOUNTER — Encounter: Payer: Self-pay | Admitting: Family Medicine

## 2014-09-28 ENCOUNTER — Ambulatory Visit (INDEPENDENT_AMBULATORY_CARE_PROVIDER_SITE_OTHER): Payer: Medicare Other | Admitting: Family Medicine

## 2014-09-28 ENCOUNTER — Ambulatory Visit (HOSPITAL_BASED_OUTPATIENT_CLINIC_OR_DEPARTMENT_OTHER)
Admission: RE | Admit: 2014-09-28 | Discharge: 2014-09-28 | Disposition: A | Discharge status: Home / Self Care | Payer: Medicare Other | Source: Ambulatory Visit | Attending: Family Medicine | Admitting: Family Medicine

## 2014-09-28 VITALS — BP 110/77 | HR 90 | Temp 98.3°F | Ht 63.0 in | Wt 211.4 lb

## 2014-09-28 DIAGNOSIS — M25572 Pain in left ankle and joints of left foot: Secondary | ICD-10-CM

## 2014-09-28 DIAGNOSIS — E669 Obesity, unspecified: Secondary | ICD-10-CM

## 2014-09-28 DIAGNOSIS — C50311 Malignant neoplasm of lower-inner quadrant of right female breast: Secondary | ICD-10-CM

## 2014-09-28 DIAGNOSIS — E782 Mixed hyperlipidemia: Secondary | ICD-10-CM | POA: Diagnosis not present

## 2014-09-28 DIAGNOSIS — N3010 Interstitial cystitis (chronic) without hematuria: Secondary | ICD-10-CM

## 2014-09-28 DIAGNOSIS — R7309 Other abnormal glucose: Secondary | ICD-10-CM

## 2014-09-28 DIAGNOSIS — M79672 Pain in left foot: Secondary | ICD-10-CM | POA: Insufficient documentation

## 2014-09-28 DIAGNOSIS — K2100 Gastro-esophageal reflux disease with esophagitis, without bleeding: Secondary | ICD-10-CM

## 2014-09-28 DIAGNOSIS — I10 Essential (primary) hypertension: Secondary | ICD-10-CM | POA: Diagnosis not present

## 2014-09-28 DIAGNOSIS — M25579 Pain in unspecified ankle and joints of unspecified foot: Secondary | ICD-10-CM | POA: Insufficient documentation

## 2014-09-28 DIAGNOSIS — M2012 Hallux valgus (acquired), left foot: Secondary | ICD-10-CM | POA: Diagnosis not present

## 2014-09-28 DIAGNOSIS — M7732 Calcaneal spur, left foot: Secondary | ICD-10-CM | POA: Diagnosis not present

## 2014-09-28 DIAGNOSIS — R7303 Prediabetes: Secondary | ICD-10-CM

## 2014-09-28 DIAGNOSIS — M19072 Primary osteoarthritis, left ankle and foot: Secondary | ICD-10-CM | POA: Diagnosis not present

## 2014-09-28 DIAGNOSIS — D649 Anemia, unspecified: Secondary | ICD-10-CM

## 2014-09-28 HISTORY — DX: Pain in unspecified ankle and joints of unspecified foot: M25.579

## 2014-09-28 HISTORY — DX: Obesity, unspecified: E66.9

## 2014-09-28 LAB — COMPREHENSIVE METABOLIC PANEL
ALBUMIN: 4.1 g/dL (ref 3.5–5.2)
ALK PHOS: 93 U/L (ref 39–117)
ALT: 32 U/L (ref 0–35)
AST: 26 U/L (ref 0–37)
BUN: 13 mg/dL (ref 6–23)
CALCIUM: 9.3 mg/dL (ref 8.4–10.5)
CHLORIDE: 103 meq/L (ref 96–112)
CO2: 30 mEq/L (ref 19–32)
CREATININE: 0.85 mg/dL (ref 0.40–1.20)
GFR: 70.21 mL/min (ref >60.00)
Glucose, Bld: 103 mg/dL — ABNORMAL HIGH (ref 70–99)
Potassium: 4.1 mEq/L (ref 3.5–5.1)
Sodium: 141 mEq/L (ref 135–145)
TOTAL PROTEIN: 6.9 g/dL (ref 6.0–8.3)
Total Bilirubin: 0.4 mg/dL (ref 0.2–1.2)

## 2014-09-28 LAB — CBC
HCT: 37 % (ref 36.0–46.0)
Hemoglobin: 12.3 g/dL (ref 12.0–15.0)
MCHC: 33.3 g/dL (ref 30.0–36.0)
MCV: 88.3 fl (ref 78.0–100.0)
Platelets: 264 10*3/uL (ref 150.0–400.0)
RBC: 4.19 Mil/uL (ref 3.87–5.11)
RDW: 14.5 % (ref 11.5–15.5)
WBC: 6.4 10*3/uL (ref 4.0–10.5)

## 2014-09-28 LAB — HEMOGLOBIN A1C: HEMOGLOBIN A1C: 6 % (ref 4.6–6.5)

## 2014-09-28 LAB — LIPID PANEL
CHOLESTEROL: 186 mg/dL (ref 0–200)
HDL: 44.5 mg/dL (ref >39.00)
NonHDL: 141.67
Total CHOL/HDL Ratio: 4
Triglycerides: 235 mg/dL — ABNORMAL HIGH (ref 0.0–149.0)
VLDL: 47 mg/dL — AB (ref 0.0–40.0)

## 2014-09-28 LAB — TSH: TSH: 2.64 u[IU]/mL (ref 0.35–4.50)

## 2014-09-28 LAB — LDL CHOLESTEROL, DIRECT: LDL DIRECT: 111 mg/dL

## 2014-09-28 LAB — URIC ACID: URIC ACID, SERUM: 6.1 mg/dL (ref 2.4–7.0)

## 2014-09-28 MED ORDER — TELMISARTAN 80 MG PO TABS: ORAL_TABLET | ORAL | Status: DC

## 2014-09-28 MED ORDER — RANITIDINE HCL 150 MG PO TABS: 150.0000 mg | ORAL_TABLET | Freq: Two times a day (BID) | ORAL | Status: DC | PRN

## 2014-09-28 MED ORDER — CARVEDILOL 6.25 MG PO TABS: ORAL_TABLET | ORAL | Status: DC

## 2014-09-28 NOTE — Progress Notes (Signed)
Subjective:    Patient ID: Shannon [name], female    DOB: 03-23-1945, 68 y.o.   MRN: [id number]  Chief Complaint  Patient presents with  . Follow-up    HPI Patient is in today for follow up with numerous concerns. She is noting left foot pain at base of 1 and 2 toes for 3 weeks no trauma, aches and keeps her up qhs. Had a fall with back pain which lead to breast reduction but htat is all better. No recent illness. IC well controlled on current meds. Struggles with her adult son's dementia bu tis doing OK. Denies CP/palp/SOB/HA/congestion/fevers/GI or GU c/o. Taking meds as prescribed  Past Medical History  Diagnosis Date  . NICM (nonischemic cardiomyopathy) 03/08/2010    likely 2/2 chemotx - a. Echo 2012: EF 45-50%;  b. Lex MV 2/12:  low risk, apical defect (small area of ischemia vs shifting breast atten);  c.  Echo 7/12: Normal wall thickness, EF 60-65%, normal wall motion, grade 1 diastolic dysfunction, mild LAE, PASP 32;   d. Lex MV 11/13:  EF 76%, no ischemia  . GERD 08/11/2008  . HYPERLIPIDEMIA 08/11/2008  . HYPERTENSION 08/11/2008  . LIPOMA 02/10/2009  . PREDIABETES 06/15/2009  . 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 Carpal tunnel syndrome of left wrist   . Neuropathy   . Interstitial cystitis   . Brain tumor   . DEPRESSION 08/11/2008  . 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 Osteoporosis   . Hyponatremia 08/28/2011  . Anemia 08/28/2011  . History of chicken pox   . H/O measles   . H/O mumps   . Dermatitis   . OA (osteoarthritis) 09/07/2011  . Melanoma 2008    DR. Ronnald Ramp  . Shortness of breath   . Asthma   . History of blood transfusion   . UTI (lower urinary tract infection) 03/03/2012  . Constipation 03/03/2012  . 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 Cholecystectomy    . Carpal tunnel release      L wrist, ulnar nerve moved  . Elbow surgery        left  . Tubal ligation  1997  . Lipoma excision  03/28/2009    right leg  . Portacath placement  may 2011  . Craniotomy for tumor  2000  . Tonsillectomy  1958  . Melanoma excision    . Gregg cath    . Port-a-cath removal  11/30/2010    Streck  . Total knee raplacement  01-2012    Right Knee  . Total knee arthroplasty  02/05/2012    Procedure: TOTAL KNEE ARTHROPLASTY;  Surgeon: Gearlean Alf, MD;  Location: [facility];  Service: Orthopedics;  Laterality: Right;  . Total knee arthroplasty Left 02/10/2013    Procedure: LEFT TOTAL KNEE ARTHROPLASTY;  Surgeon: Gearlean Alf, MD;  Location: [facility];  Service: Orthopedics;  Laterality: Left;  . Left heart catheterization with coronary angiogram N/A 12/16/2013    Procedure: LEFT HEART CATHETERIZATION WITH CORONARY ANGIOGRAM;  Surgeon: Peter M [name], MD;  Location: [facility] CATH LAB;  Service: Cardiovascular;  Laterality: N/A;  . Breast lumpectomy  04/2009    RIGHT FOR BREAST CANCER-CHEMO/RADIATION X 1 YEAR  . Breast reduction surgery Bilateral 05/04/2014    Procedure: MAMMARY REDUCTION  (BREAST);  Surgeon: Cristine Polio, MD;  Location: [place];  Service: Plastics;  Laterality: Bilateral;    Family History  Problem Relation Age of Onset  . Breast cancer Mother   . Lung cancer Mother   . Breast cancer Mother     sarcoma  . Hypertension Mother   . Colon cancer Neg Hx   . Prostate cancer Father   . Congestive Heart Failure Father   . Prostate cancer Father   . Prostate cancer Brother   . Prostate cancer Brother   . Stomach cancer Maternal Aunt   . Uterine cancer Maternal Aunt   . Down syndrome Son   . Heart disease Maternal Grandfather     MI  . Heart attack Father     Social History   Social History  . Marital Status: Married    Spouse Name: N/A  . Number of Children: 3  . Years of Education: N/A   Occupational History  . boutique owner    Social History Main Topics  . Smoking status: Never Smoker   .  Smokeless tobacco: Never Used  . Alcohol Use: No  . Drug Use: No  . Sexual Activity: No   Other Topics Concern  . Not on file   Social History Narrative    Outpatient Prescriptions Prior to Visit  Medication Sig Dispense Refill  . alendronate (FOSAMAX) 70 MG tablet Take 1 tablet (70 mg total) by mouth every 7 (seven) days. Take with a full glass of water on an empty stomach. 4 tablet 11  . B Complex-C (SUPER B COMPLEX PO) Take 1 tablet by mouth daily.    . Cholecalciferol 2000 UNITS CAPS Take 2,000 Units by mouth daily.    . clonazePAM (KLONOPIN) 1 MG tablet TAKE 1/2 TABLET BY MOUTH AT BEDTIME. 30 tablet 0  . CRESTOR 20 MG tablet TAKE 1 TABLET (20 MG TOTAL) BY MOUTH AT BEDTIME. 30 tablet 5  . Ferrous Fumarate 324 MG TABS Take 1 tablet (106 mg of iron total) by mouth daily. 30 each 4  . folic acid (FOLVITE) 1 MG tablet TAKE 1 TABLET BY MOUTH EVERY DAY 30 tablet 5  . gabapentin (NEURONTIN) 300 MG capsule Take 300 mg by mouth daily.     . hydrOXYzine (ATARAX/VISTARIL) 25 MG tablet Take 25 mg by mouth at bedtime.    . pentosan polysulfate (ELMIRON) 100 MG capsule Take 200 mg by mouth 2 (two) times daily.    . psyllium (METAMUCIL SMOOTH TEXTURE) 28 % packet Take 1 packet by mouth daily as needed (constipation).    . venlafaxine XR (EFFEXOR-XR) 150 MG 24 hr capsule TAKE ONE CAPSULE BY MOUTH EVERY DAY 30 capsule 5  . carvedilol (COREG) 6.25 MG tablet TAKE 1 TABLET (6.25 MG TOTAL) BY MOUTH 2 (TWO) TIMES DAILY WITH A MEAL. 60 tablet 2  . ranitidine (ZANTAC) 150 MG tablet Take 150 mg by mouth 2 (two) times daily as needed for heartburn.     . telmisartan (MICARDIS) 80 MG tablet TAKE 1 TABLET (80 MG TOTAL) BY MOUTH DAILY. 30 tablet 1   No facility-administered medications prior to visit.    Allergies  Allergen Reactions  . Dilaudid [Hydromorphone Hcl] Itching    Review of Systems  Constitutional: Negative for fever and malaise/fatigue.  HENT: Negative for congestion.   Eyes: Negative for  discharge.  Respiratory: Negative for shortness of breath.   Cardiovascular: Negative for chest pain, palpitations and leg swelling.  Gastrointestinal: Negative for nausea and abdominal pain.  Genitourinary: Negative for  dysuria.  Musculoskeletal: Negative for falls.  Skin: Negative for rash.  Neurological: Negative for loss of consciousness and headaches.  Endo/Heme/Allergies: Negative for environmental allergies.  Psychiatric/Behavioral: Negative for depression. The patient is not nervous/anxious.        Objective:    Physical Exam  Constitutional: She is oriented to person, place, and time. She appears well-developed and well-nourished. No distress.  HENT:  Head: Normocephalic and atraumatic.  Nose: Nose normal.  Eyes: Right eye exhibits no discharge. Left eye exhibits no discharge.  Neck: Normal range of motion. Neck supple.  Cardiovascular: Normal rate and regular rhythm.   No murmur heard. Pulmonary/Chest: Effort normal and breath sounds normal.  Abdominal: Soft. Bowel sounds are normal. There is no tenderness.  Musculoskeletal: She exhibits no edema.  Neurological: She is alert and oriented to person, place, and time.  Skin: Skin is warm and dry.  Psychiatric: She has a normal mood and affect.  Nursing note and vitals reviewed.   BP 110/77 mmHg  Pulse 90  Temp(Src) 98.3 F (36.8 C) (Oral)  Ht _0  (1.6 m)  Wt 211 lb 6 oz (95.879 kg)  BMI 37.45 kg/m2  SpO2 95%  LMP 01/30/1994 Wt Readings from Last 3 Encounters:  09/28/14 211 lb 6 oz (95.879 kg)  05/04/14 205 lb 4 oz (93.101 kg)  04/27/14 206 lb (93.441 kg)     Lab Results  Component Value Date   WBC 5.0 04/27/2014   HGB 12.2 04/27/2014   HCT 37.4 04/27/2014   PLT 243 04/27/2014   GLUCOSE 93 04/27/2014   CHOL 201* 10/10/2013   TRIG 195.0* 10/10/2013   HDL 41.90 10/10/2013   LDLDIRECT 103.2 12/05/2011   LDLCALC 120* 10/10/2013   ALT 31 04/27/2014   AST 28 04/27/2014   NA 142 04/27/2014   K 4.4  04/27/2014   CL 105 12/09/2013   CREATININE 0.9 04/27/2014   BUN 11.1 04/27/2014   CO2 26 04/27/2014   TSH 1.88 10/10/2013   INR 1.02 12/09/2013   HGBA1C 6.1 10/10/2013    Lab Results  Component Value Date   TSH 1.88 10/10/2013   Lab Results  Component Value Date   WBC 5.0 04/27/2014   HGB 12.2 04/27/2014   HCT 37.4 04/27/2014   MCV 91.0 04/27/2014   PLT 243 04/27/2014   Lab Results  Component Value Date   NA 142 04/27/2014   K 4.4 04/27/2014   CHLORIDE 106 04/27/2014   CO2 26 04/27/2014   GLUCOSE 93 04/27/2014   BUN 11.1 04/27/2014   CREATININE 0.9 04/27/2014   BILITOT 0.44 04/27/2014   ALKPHOS 86 04/27/2014   AST 28 04/27/2014   ALT 31 04/27/2014   PROT 6.8 04/27/2014   ALBUMIN 4.1 04/27/2014   CALCIUM 9.7 04/27/2014   ANIONGAP 10 04/27/2014   EGFR 68* 04/27/2014   GFR 65.92 10/10/2013   Lab Results  Component Value Date   CHOL 201* 10/10/2013   Lab Results  Component Value Date   HDL 41.90 10/10/2013   Lab Results  Component Value Date   LDLCALC 120* 10/10/2013   Lab Results  Component Value Date   TRIG 195.0* 10/10/2013   Lab Results  Component Value Date   CHOLHDL 5 10/10/2013   Lab Results  Component Value Date   HGBA1C 6.1 10/10/2013       Assessment & Plan:  Hyperlipidemia, mixed Encouraged heart healthy diet, increase exercise, avoid trans fats, consider a krill oil cap daily  Essential hypertension Well controlled,  no changes to meds. Encouraged heart healthy diet such as the DASH diet and exercise as tolerated.   Gastro-esophageal reflux Avoid offending foods, start probiotics. Do not eat large meals in late evening and consider raising head of bed.   Breast cancer of lower-inner quadrant of right female breast  Following with oncology, managing her disease well, had a breast reduction this year and all tissue benign  Obesity Encouraged DASH diet, decrease po intake and increase exercise as tolerated. Needs 7-8 hours of  sleep nightly. Avoid trans fats, eat small, frequent meals every 4-5 hours with lean proteins, complex carbs and healthy fats. Minimize simple carbs, GMO foods.  Glucose intolerance (pre-diabetes) hgba1c acceptable, minimize simple carbs. Increase exercise as tolerated. Continue current meds  IC (interstitial cystitis) Follows with Dr Alona Bene [facility] urology, asymptomatic  Postop Anemia re  Pain in joint, ankle and foot Xray, uric acid, ice, good shoes, salon pas gel bid    I have changed Ms. Caratachea's ranitidine. I am also having her maintain her gabapentin, hydrOXYzine, psyllium, pentosan polysulfate, Cholecalciferol, B Complex-C (SUPER B COMPLEX PO), alendronate, Ferrous Fumarate, CRESTOR, venlafaxine XR, folic acid, clonazePAM, telmisartan, and carvedilol.  Meds ordered this encounter  Medications  . telmisartan (MICARDIS) 80 MG tablet    Sig: TAKE 1 TABLET (80 MG TOTAL) BY MOUTH DAILY.    Dispense:  30 tablet    Refill:  5  . carvedilol (COREG) 6.25 MG tablet    Sig: TAKE 1 TABLET (6.25 MG TOTAL) BY MOUTH 2 (TWO) TIMES DAILY WITH A MEAL.    Dispense:  60 tablet    Refill:  5  . ranitidine (ZANTAC) 150 MG tablet    Sig: Take 1 tablet (150 mg total) by mouth 2 (two) times daily as needed for heartburn.    Dispense:  60 tablet    Refill:  5     Penni Homans, MD

## 2014-09-28 NOTE — Assessment & Plan Note (Signed)
Follows with Dr Alona Bene [facility] urology, asymptomatic

## 2014-09-28 NOTE — Assessment & Plan Note (Signed)
Encouraged DASH diet, decrease po intake and increase exercise as tolerated. Needs 7-8 hours of sleep nightly. Avoid trans fats, eat small, frequent meals every 4-5 hours with lean proteins, complex carbs and healthy fats. Minimize simple carbs, GMO foods. 

## 2014-09-28 NOTE — Assessment & Plan Note (Signed)
Xray, uric acid, ice, good shoes, salon pas gel bid

## 2014-09-28 NOTE — Assessment & Plan Note (Signed)
Avoid offending foods, start probiotics. Do not eat large meals in late evening and consider raising head of bed.  

## 2014-09-28 NOTE — Assessment & Plan Note (Signed)
Encouraged heart healthy diet, increase exercise, avoid trans fats, consider a krill oil cap daily 

## 2014-09-28 NOTE — Assessment & Plan Note (Signed)
Well controlled, no changes to meds. Encouraged heart healthy diet such as the DASH diet and exercise as tolerated.  °

## 2014-09-28 NOTE — Assessment & Plan Note (Addendum)
Following with oncology, managing her disease well, had a breast reduction this year and all tissue benign

## 2014-09-28 NOTE — Assessment & Plan Note (Signed)
hgba1c acceptable, minimize simple carbs. Increase exercise as tolerated. Continue current meds 

## 2014-09-28 NOTE — Progress Notes (Signed)
Pre visit review using our clinic review tool, if applicable. No additional management support is needed unless otherwise documented below in the visit note. 

## 2014-09-28 NOTE — Patient Instructions (Signed)
Salon Pas gel twice daily to foot   DASH Eating Plan DASH stands for "Dietary Approaches to Stop Hypertension." The DASH eating plan is a healthy eating plan that has been shown to reduce high blood pressure (hypertension). Additional health benefits may include reducing the risk of type 2 diabetes mellitus, heart disease, and stroke. The DASH eating plan may also help with weight loss. WHAT DO I NEED TO KNOW ABOUT THE DASH EATING PLAN? For the DASH eating plan, you will follow these general guidelines:  Choose foods with a percent daily value for sodium of less than 5% (as listed on the food label).  Use salt-free seasonings or herbs instead of table salt or sea salt.  Check with your health care provider or pharmacist before using salt substitutes.  Eat lower-sodium products, often labeled as "lower sodium" or "no salt added."  Eat fresh foods.  Eat more vegetables, fruits, and low-fat dairy products.  Choose whole grains. Look for the word "whole" as the first word in the ingredient list.  Choose fish and skinless chicken or [place] more often than red meat. Limit fish, poultry, and meat to 6 oz (170 g) each day.  Limit sweets, desserts, sugars, and sugary drinks.  Choose heart-healthy fats.  Limit cheese to 1 oz (28 g) per day.  Eat more home-cooked food and less restaurant, buffet, and fast food.  Limit fried foods.  Cook foods using methods other than frying.  Limit canned vegetables. If you do use them, rinse them well to decrease the sodium.  When eating at a restaurant, ask that your food be prepared with less salt, or no salt if possible. WHAT FOODS CAN I EAT? Seek help from a dietitian for individual calorie needs. Grains Whole grain or whole wheat bread. Brown rice. Whole grain or whole wheat pasta. Quinoa, bulgur, and whole grain cereals. Low-sodium cereals. Corn or whole wheat flour tortillas. Whole grain cornbread. Whole grain crackers. Low-sodium  crackers. Vegetables Fresh or frozen vegetables (raw, steamed, roasted, or grilled). Low-sodium or reduced-sodium tomato and vegetable juices. Low-sodium or reduced-sodium tomato sauce and paste. Low-sodium or reduced-sodium canned vegetables.  Fruits All fresh, canned (in natural juice), or frozen fruits. Meat and Other Protein Products Ground beef (85% or leaner), grass-fed beef, or beef trimmed of fat. Skinless chicken or [place]. Ground chicken or [place]. Pork trimmed of fat. All fish and seafood. Eggs. Dried beans, peas, or lentils. Unsalted nuts and seeds. Unsalted canned beans. Dairy Low-fat dairy products, such as skim or 1% milk, 2% or reduced-fat cheeses, low-fat ricotta or cottage cheese, or plain low-fat yogurt. Low-sodium or reduced-sodium cheeses. Fats and Oils Tub margarines without trans fats. Light or reduced-fat mayonnaise and salad dressings (reduced sodium). Avocado. Safflower, olive, or canola oils. Natural peanut or almond butter. Other Unsalted popcorn and pretzels. The items listed above may not be a complete list of recommended foods or beverages. Contact your dietitian for more options. WHAT FOODS ARE NOT RECOMMENDED? Grains White bread. White pasta. White rice. Refined cornbread. Bagels and croissants. Crackers that contain trans fat. Vegetables Creamed or fried vegetables. Vegetables in a cheese sauce. Regular canned vegetables. Regular canned tomato sauce and paste. Regular tomato and vegetable juices. Fruits Dried fruits. Canned fruit in light or heavy syrup. Fruit juice. Meat and Other Protein Products Fatty cuts of meat. Ribs, chicken wings, bacon, sausage, bologna, salami, chitterlings, fatback, hot dogs, bratwurst, and packaged luncheon meats. Salted nuts and seeds. Canned beans with salt. Dairy Whole or 2% milk, cream,  half-and-half, and cream cheese. Whole-fat or sweetened yogurt. Full-fat cheeses or blue cheese. Nondairy creamers and whipped toppings.  Processed cheese, cheese spreads, or cheese curds. Condiments Onion and garlic salt, seasoned salt, table salt, and sea salt. Canned and packaged gravies. Worcestershire sauce. Tartar sauce. Barbecue sauce. Teriyaki sauce. Soy sauce, including reduced sodium. Steak sauce. Fish sauce. Oyster sauce. Cocktail sauce. Horseradish. Ketchup and mustard. Meat flavorings and tenderizers. Bouillon cubes. Hot sauce. Tabasco sauce. Marinades. Taco seasonings. Relishes. Fats and Oils Butter, stick margarine, lard, shortening, ghee, and bacon fat. Coconut, palm kernel, or palm oils. Regular salad dressings. Other Pickles and olives. Salted popcorn and pretzels. The items listed above may not be a complete list of foods and beverages to avoid. Contact your dietitian for more information. WHERE CAN I FIND MORE INFORMATION? National Heart, Lung, and Blood Institute: travelstabloid.com Document Released: 01/05/2011 Document Revised: 06/02/2013 Document Reviewed: 11/20/2012 [facility] Patient Information 2015 [place], [place]. This information is not intended to replace advice given to you by your health care provider. Make sure you discuss any questions you have with your health care provider.

## 2014-09-28 NOTE — Assessment & Plan Note (Signed)
re

## 2014-10-01 DIAGNOSIS — Z96652 Presence of left artificial knee joint: Secondary | ICD-10-CM | POA: Diagnosis not present

## 2014-10-01 DIAGNOSIS — T8482XD Fibrosis due to internal orthopedic prosthetic devices, implants and grafts, subsequent encounter: Secondary | ICD-10-CM | POA: Diagnosis not present

## 2014-10-01 DIAGNOSIS — Z471 Aftercare following joint replacement surgery: Secondary | ICD-10-CM | POA: Diagnosis not present

## 2014-10-06 ENCOUNTER — Ambulatory Visit: Payer: Self-pay | Admitting: Orthopedic Surgery

## 2014-10-06 NOTE — Progress Notes (Signed)
Preoperative surgical orders have been place into the Epic hospital system for Shannon [name] on 10/06/2014, 4:07 PM  by Mickel Crow for surgery on 10-12-2014.  Preop Knee Scope orders including IV Tylenol and IV Decadron as long as there are no contraindications to the above medications. Arlee Muslim, PA-C

## 2014-10-07 ENCOUNTER — Other Ambulatory Visit (HOSPITAL_COMMUNITY): Payer: Self-pay | Admitting: *Deleted

## 2014-10-08 ENCOUNTER — Encounter (HOSPITAL_COMMUNITY): Payer: Self-pay

## 2014-10-08 ENCOUNTER — Encounter (HOSPITAL_COMMUNITY)
Admission: RE | Admit: 2014-10-08 | Discharge: 2014-10-08 | Disposition: A | Discharge status: Home / Self Care | Payer: Medicare Other | Source: Ambulatory Visit | Attending: Orthopedic Surgery | Admitting: Orthopedic Surgery

## 2014-10-08 DIAGNOSIS — M7652 Patellar tendinitis, left knee: Secondary | ICD-10-CM | POA: Insufficient documentation

## 2014-10-08 NOTE — Progress Notes (Signed)
   10/08/14 1122  OBSTRUCTIVE SLEEP APNEA  Have you ever been diagnosed with sleep apnea through a sleep study? No  Do you snore loudly (loud enough to be heard through closed doors)?  0  Do you often feel tired, fatigued, or sleepy during the daytime (such as falling asleep during driving or talking to someone)? 1  Has anyone observed you stop breathing during your sleep? 0  Do you have, or are you being treated for high blood pressure? 1  BMI more than 35 kg/m2? 1  Age > 50 (1-yes) 1  Neck circumference greater than:Female 16 inches or larger, Female 17inches or larger? 1  Female Gender (Yes=1) 0  Obstructive Sleep Apnea Score 5

## 2014-10-08 NOTE — Patient Instructions (Signed)
ISYSS ESPINAL  10/08/2014   Your procedure is scheduled on: Monday 10/12/2014  Report to [facility] Main  Entrance take [facility]  elevators to 3rd floor to  [place] at  1030 AM.  Call this number if you have problems the morning of surgery [phone]   Remember: ONLY 1 PERSON MAY GO WITH YOU TO SHORT STAY TO GET  READY MORNING OF [place].   Do not eat food  :After Midnight.MAY HAVE CLEAR LIQUIDS FROM MIDNIGHT UP UNTIL 0730 AM THE MORNING OF SURGERY AND THEN NOTHING UNTIL AFTER SURGERY!     Take these medicines the morning of surgery with A SIP OF WATER: CARVEDILOL (COREG), GABAPENTIN (NEURONTIN), TELMISARTAN (MICARDIS)                               You may not have any metal on your body including hair pins and              piercings  Do not wear jewelry, make-up, lotions, powders or perfumes, deodorant             Do not wear nail polish.  Do not shave  48 hours prior to surgery.              Men may shave face and neck.   Do not bring valuables to the hospital. [place].  Contacts, dentures or bridgework may not be worn into surgery.  Leave suitcase in the car. After surgery it may be brought to your room.     Patients discharged the day of surgery will not be allowed to drive home.  Name and phone number of your driver:  Special Instructions: N/A              Please read over the following fact sheets you were given: _____________________________________________________________________             [facility] - Preparing for Surgery Before surgery, you can play an important role.  Because skin is not sterile, your skin needs to be as free of germs as possible.  You can reduce the number of germs on your skin by washing with CHG (chlorahexidine gluconate) soap before surgery.  CHG is an antiseptic cleaner which kills germs and bonds with the skin to continue killing germs even after  washing. Please DO NOT use if you have an allergy to CHG or antibacterial soaps.  If your skin becomes reddened/irritated stop using the CHG and inform your nurse when you arrive at Short Stay. Do not shave (including legs and underarms) for at least 48 hours prior to the first CHG shower.  You may shave your face/neck. Please follow these instructions carefully:  1.  Shower with CHG Soap the night before surgery and the  morning of Surgery.  2.  If you choose to wash your hair, wash your hair first as usual with your  normal  shampoo.  3.  After you shampoo, rinse your hair and body thoroughly to remove the  shampoo.                           4.  Use CHG as you would any other liquid soap.  You can apply chg directly  to the skin and wash                       Gently with a scrungie or clean washcloth.  5.  Apply the CHG Soap to your body ONLY FROM THE NECK DOWN.   Do not use on face/ open                           Wound or open sores. Avoid contact with eyes, ears mouth and genitals (private parts).                       Wash face,  Genitals (private parts) with your normal soap.             6.  Wash thoroughly, paying special attention to the area where your surgery  will be performed.  7.  Thoroughly rinse your body with warm water from the neck down.  8.  DO NOT shower/wash with your normal soap after using and rinsing off  the CHG Soap.                9.  Pat yourself dry with a clean towel.            10.  Wear clean pajamas.            11.  Place clean sheets on your bed the night of your first shower and do not  sleep with pets. Day of Surgery : Do not apply any lotions/deodorants the morning of surgery.  Please wear clean clothes to the hospital/surgery center.  FAILURE TO FOLLOW THESE INSTRUCTIONS MAY RESULT IN THE CANCELLATION OF YOUR SURGERY PATIENT SIGNATURE_________________________________  NURSE  SIGNATURE__________________________________  ________________________________________________________________________   Adam Phenix  An incentive spirometer is a tool that can help keep your lungs clear and active. This tool measures how well you are filling your lungs with each breath. Taking long deep breaths may help reverse or decrease the chance of developing breathing (pulmonary) problems (especially infection) following:  A long period of time when you are unable to move or be active. BEFORE THE PROCEDURE   If the spirometer includes an indicator to show your best effort, your nurse or respiratory therapist will set it to a desired goal.  If possible, sit up straight or lean slightly forward. Try not to slouch.  Hold the incentive spirometer in an upright position. INSTRUCTIONS FOR USE  1. Sit on the edge of your bed if possible, or sit up as far as you can in bed or on a chair. 2. Hold the incentive spirometer in an upright position. 3. Breathe out normally. 4. Place the mouthpiece in your mouth and seal your lips tightly around it. 5. Breathe in slowly and as deeply as possible, raising the piston or the ball toward the top of the column. 6. Hold your breath for 3-5 seconds or for as long as possible. Allow the piston or ball to fall to the bottom of the column. 7. Remove the mouthpiece from your mouth and breathe out normally. 8. Rest for a few seconds and repeat Steps 1 through 7 at least 10 times every 1-2 hours when you are awake. Take your time and take a few normal breaths between deep breaths. 9. The spirometer may include an indicator to show your best effort. Use the indicator as a goal to work toward  during each repetition. 10. After each set of 10 deep breaths, practice coughing to be sure your lungs are clear. If you have an incision (the cut made at the time of surgery), support your incision when coughing by placing a pillow or rolled up towels firmly  against it. Once you are able to get out of bed, walk around indoors and cough well. You may stop using the incentive spirometer when instructed by your caregiver.  RISKS AND COMPLICATIONS  Take your time so you do not get dizzy or light-headed.  If you are in pain, you may need to take or ask for pain medication before doing incentive spirometry. It is harder to take a deep breath if you are having pain. AFTER USE  Rest and breathe slowly and easily.  It can be helpful to keep track of a log of your progress. Your caregiver can provide you with a simple table to help with this. If you are using the spirometer at home, follow these instructions: [place] IF:   You are having difficultly using the spirometer.  You have trouble using the spirometer as often as instructed.  Your pain medication is not giving enough relief while using the spirometer.  You develop fever of 100.5 F (38.1 C) or higher. SEEK IMMEDIATE MEDICAL CARE IF:   You cough up bloody sputum that had not been present before.  You develop fever of 102 F (38.9 C) or greater.  You develop worsening pain at or near the incision site. MAKE SURE YOU:   Understand these instructions.  Will watch your condition.  Will get help right away if you are not doing well or get worse. Document Released: 05/29/2006 Document Revised: 04/10/2011 Document Reviewed: 07/30/2006 [employer] Patient Information 2014 [employer], [place].   ________________________________________________________________________    CLEAR LIQUID DIET   Foods Allowed                                                                     Foods Excluded  Coffee and tea, regular and decaf                             liquids that you cannot  Plain Jell-O in any flavor                                             see through such as: Fruit ices (not with fruit pulp)                                     milk, soups, orange juice  Iced Popsicles                                     All solid food Carbonated beverages, regular and diet  Cranberry, grape and apple juices Sports drinks like Gatorade Lightly seasoned clear broth or consume(fat free) Sugar, honey syrup  Sample Menu Breakfast                                Lunch                                     Supper Cranberry juice                    Beef broth                            Chicken broth Jell-O                                     Grape juice                           Apple juice Coffee or tea                        Jell-O                                      Popsicle                                                Coffee or tea                        Coffee or tea  _____________________________________________________________________

## 2014-10-11 DIAGNOSIS — Z96659 Presence of unspecified artificial knee joint: Secondary | ICD-10-CM

## 2014-10-11 DIAGNOSIS — M25869 Other specified joint disorders, unspecified knee: Secondary | ICD-10-CM

## 2014-10-11 NOTE — H&P (Signed)
CC- Shannon [name] is a 69 y.o. female who presents with left knee pain.  HPI- . Knee Pain: . Patient presents with knee pain involving the  left knee. Onset of the symptoms was several months ago. Inciting event: She had a right total knee arthroplasty several years ago and had done extremely well until a few months ago when she developed painful popping. . Current symptoms include crepitus sensation. Pain is aggravated by going up and down stairs and rising after sitting.  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 She presents for left knee arthroscopy and synovectomy  Past Medical History  Diagnosis Date  . NICM (nonischemic cardiomyopathy) 03/08/2010    likely 2/2 chemotx - a. Echo 2012: EF 45-50%;  b. Lex MV 2/12:  low risk, apical defect (small area of ischemia vs shifting breast atten);  c.  Echo 7/12: Normal wall thickness, EF 60-65%, normal wall motion, grade 1 diastolic dysfunction, mild LAE, PASP 32;   d. Lex MV 11/13:  EF 76%, no ischemia  . GERD 08/11/2008  . HYPERLIPIDEMIA 08/11/2008  . HYPERTENSION 08/11/2008  . LIPOMA 02/10/2009  . PREDIABETES 06/15/2009  . 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 Carpal tunnel syndrome of left wrist   . Neuropathy   . Interstitial cystitis   . Brain tumor   . DEPRESSION 08/11/2008  . 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 Osteoporosis   . Hyponatremia 08/28/2011  . Anemia 08/28/2011  . History of chicken pox   . H/O measles   . H/O mumps   . Dermatitis   . OA (osteoarthritis) 09/07/2011  . Melanoma 2008    DR. Ronnald Ramp  . Shortness of breath   . Asthma   . History of blood transfusion   . UTI (lower urinary tract infection) 03/03/2012  . Constipation 03/03/2012  . 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 Obesity 09/28/2014    Past Surgical History  Procedure Laterality Date  . Cholecystectomy    . Carpal tunnel release      L wrist, ulnar nerve  moved  . Elbow surgery      left  . Tubal ligation  1997  . Lipoma excision  03/28/2009    right leg  . Portacath placement  may 2011  . Craniotomy for tumor  2000  . Tonsillectomy  1958  . Melanoma excision    . [place] cath    . Port-a-cath removal  11/30/2010    [place]  . Total knee raplacement  01-2012    Right Knee  . Total knee arthroplasty  02/05/2012    Procedure: TOTAL KNEE ARTHROPLASTY;  Surgeon: Gearlean Alf, MD;  Location: [facility];  Service: Orthopedics;  Laterality: Right;  . Total knee arthroplasty Left 02/10/2013    Procedure: LEFT TOTAL KNEE ARTHROPLASTY;  Surgeon: Gearlean Alf, MD;  Location: [facility];  Service: Orthopedics;  Laterality: Left;  . Left heart catheterization with coronary angiogram N/A 12/16/2013    Procedure: LEFT HEART CATHETERIZATION WITH CORONARY ANGIOGRAM;  Surgeon: Peter M [name], MD;  Location: [facility] CATH LAB;  Service: Cardiovascular;  Laterality: N/A;  . Breast lumpectomy  04/2009    RIGHT FOR BREAST CANCER-CHEMO/RADIATION X 1 YEAR  . Breast reduction surgery Bilateral 05/04/2014    Procedure: MAMMARY REDUCTION  (BREAST);  Surgeon: Cristine Polio, MD;  Location: [place];  Service: Plastics;  Laterality: Bilateral;    Prior to Admission medications   Medication Sig Start Date End Date Taking? Authorizing Provider  alendronate (FOSAMAX) 70 MG tablet Take 1 tablet (70 mg total) by mouth every 7 (seven) days. Take with a full glass of water on an empty stomach. 01/13/14  Yes Huel Cote, NP  aspirin EC 81 MG tablet Take 81 mg by mouth daily.   Yes Historical Provider, MD  B Complex-C (SUPER B COMPLEX PO) Take 1 tablet by mouth daily.   Yes Historical Provider, MD  carvedilol (COREG) 6.25 MG tablet TAKE 1 TABLET (6.25 MG TOTAL) BY MOUTH 2 (TWO) TIMES DAILY WITH A MEAL. 09/28/14  Yes Mosie Lukes, MD  Cholecalciferol 2000 UNITS CAPS Take 2,000 Units by mouth daily.   Yes Historical Provider, MD  clonazePAM (KLONOPIN) 1 MG tablet TAKE  1/2 TABLET BY MOUTH AT BEDTIME. 09/25/14  Yes Mosie Lukes, MD  CRESTOR 20 MG tablet TAKE 1 TABLET (20 MG TOTAL) BY MOUTH AT BEDTIME. 05/29/14  Yes Mosie Lukes, MD  Ferrous Fumarate 324 MG TABS Take 1 tablet (106 mg of iron total) by mouth daily. 04/21/14  Yes Mosie Lukes, MD  folic acid (FOLVITE) 1 MG tablet TAKE 1 TABLET BY MOUTH EVERY DAY 07/18/14  Yes Mosie Lukes, MD  gabapentin (NEURONTIN) 300 MG capsule Take 300 mg by mouth 2 (two) times daily.    Yes Historical Provider, MD  hydrOXYzine (ATARAX/VISTARIL) 25 MG tablet Take 25 mg by mouth at bedtime.   Yes Historical Provider, MD  pentosan polysulfate (ELMIRON) 100 MG capsule Take 200 mg by mouth 2 (two) times daily. 02/07/12  Yes Arlee Muslim, PA-C  Probiotic Product (META BIOTIC/BIO-ACTIVE 12 PO) Take 1 capsule by mouth daily.   Yes Historical Provider, MD  psyllium (METAMUCIL SMOOTH TEXTURE) 28 % packet Take 1 packet by mouth daily as needed (constipation). 12/12/11  Yes Mosie Lukes, MD  ranitidine (ZANTAC) 150 MG tablet Take 1 tablet (150 mg total) by mouth 2 (two) times daily as needed for heartburn. 09/28/14  Yes Mosie Lukes, MD  telmisartan (MICARDIS) 80 MG tablet TAKE 1 TABLET (80 MG TOTAL) BY MOUTH DAILY. 09/28/14  Yes Mosie Lukes, MD  venlafaxine XR (EFFEXOR-XR) 150 MG 24 hr capsule TAKE ONE CAPSULE BY MOUTH EVERY DAY Patient taking differently: TAKE ONE CAPSULE BY MOUTH EVERY DAY AT BEDTIME 07/15/14  Yes Mosie Lukes, MD   KNEE EXAM antalgic gait, soft tissue tenderness over anterior knee, no effusion, collateral ligaments intact, painful crepitus when moving from flexed to extended position  Physical Examination: General appearance - alert, well appearing, and in no distress Mental status - alert, oriented to person, place, and time Chest - clear to auscultation, no wheezes, rales or rhonchi, symmetric air entry Heart - normal rate, regular rhythm, normal S1, S2, no murmurs, rubs, clicks or gallops Abdomen - soft,  nontender, nondistended, no masses or organomegaly Neurological - alert, oriented, normal speech, no focal findings or movement disorder noted   Asessment/Plan--- Left knee patellar clunk syndrome- - Plan left knee arthroscopy with synovectomy. Procedure risks and potential comps discussed with patient who elects to proceed. Goals are decreased pain and increased function with a high likelihood of achieving both

## 2014-10-12 ENCOUNTER — Inpatient Hospital Stay (HOSPITAL_COMMUNITY): Payer: Medicare Other | Admitting: Registered Nurse

## 2014-10-12 ENCOUNTER — Encounter (HOSPITAL_COMMUNITY): Payer: Self-pay | Admitting: *Deleted

## 2014-10-12 ENCOUNTER — Ambulatory Visit (HOSPITAL_COMMUNITY)
Admission: RE | Admit: 2014-10-12 | Discharge: 2014-10-12 | Disposition: A | Discharge status: Home / Self Care | Payer: Medicare Other | Source: Ambulatory Visit | Attending: Orthopedic Surgery | Admitting: Orthopedic Surgery

## 2014-10-12 ENCOUNTER — Encounter (HOSPITAL_COMMUNITY)
Admission: RE | Disposition: A | Discharge status: Home / Self Care | Payer: Self-pay | Source: Ambulatory Visit | Attending: Orthopedic Surgery

## 2014-10-12 DIAGNOSIS — Z96653 Presence of artificial knee joint, bilateral: Secondary | ICD-10-CM | POA: Insufficient documentation

## 2014-10-12 DIAGNOSIS — G629 Polyneuropathy, unspecified: Secondary | ICD-10-CM | POA: Insufficient documentation

## 2014-10-12 DIAGNOSIS — I10 Essential (primary) hypertension: Secondary | ICD-10-CM | POA: Insufficient documentation

## 2014-10-12 DIAGNOSIS — Z96659 Presence of unspecified artificial knee joint: Secondary | ICD-10-CM

## 2014-10-12 DIAGNOSIS — F329 Major depressive disorder, single episode, unspecified: Secondary | ICD-10-CM | POA: Insufficient documentation

## 2014-10-12 DIAGNOSIS — M6752 Plica syndrome, left knee: Secondary | ICD-10-CM | POA: Diagnosis not present

## 2014-10-12 DIAGNOSIS — M1990 Unspecified osteoarthritis, unspecified site: Secondary | ICD-10-CM | POA: Diagnosis not present

## 2014-10-12 DIAGNOSIS — D649 Anemia, unspecified: Secondary | ICD-10-CM | POA: Insufficient documentation

## 2014-10-12 DIAGNOSIS — M25862 Other specified joint disorders, left knee: Secondary | ICD-10-CM | POA: Insufficient documentation

## 2014-10-12 DIAGNOSIS — J45909 Unspecified asthma, uncomplicated: Secondary | ICD-10-CM | POA: Diagnosis not present

## 2014-10-12 DIAGNOSIS — Z79899 Other long term (current) drug therapy: Secondary | ICD-10-CM | POA: Diagnosis not present

## 2014-10-12 DIAGNOSIS — E785 Hyperlipidemia, unspecified: Secondary | ICD-10-CM | POA: Diagnosis not present

## 2014-10-12 DIAGNOSIS — M25562 Pain in left knee: Secondary | ICD-10-CM | POA: Diagnosis present

## 2014-10-12 DIAGNOSIS — I429 Cardiomyopathy, unspecified: Secondary | ICD-10-CM | POA: Insufficient documentation

## 2014-10-12 DIAGNOSIS — K219 Gastro-esophageal reflux disease without esophagitis: Secondary | ICD-10-CM | POA: Insufficient documentation

## 2014-10-12 DIAGNOSIS — M179 Osteoarthritis of knee, unspecified: Secondary | ICD-10-CM | POA: Diagnosis not present

## 2014-10-12 DIAGNOSIS — M25869 Other specified joint disorders, unspecified knee: Secondary | ICD-10-CM

## 2014-10-12 DIAGNOSIS — E669 Obesity, unspecified: Secondary | ICD-10-CM | POA: Diagnosis not present

## 2014-10-12 HISTORY — PX: SYNOVECTOMY: SHX5180

## 2014-10-12 HISTORY — PX: KNEE ARTHROSCOPY: SHX127

## 2014-10-12 SURGERY — ARTHROSCOPY, KNEE
Anesthesia: General | Site: Knee | Laterality: Left

## 2014-10-12 MED ORDER — MIDAZOLAM HCL 5 MG/5ML IJ SOLN
INTRAMUSCULAR | Status: DC | PRN
  Administered 2014-10-12: 1 mg via INTRAVENOUS

## 2014-10-12 MED ORDER — MIDAZOLAM HCL 2 MG/2ML IJ SOLN
INTRAMUSCULAR | Status: AC
  Filled 2014-10-12: qty 4

## 2014-10-12 MED ORDER — ACETAMINOPHEN 10 MG/ML IV SOLN
INTRAVENOUS | Status: AC
  Filled 2014-10-12: qty 100

## 2014-10-12 MED ORDER — FENTANYL CITRATE (PF) 100 MCG/2ML IJ SOLN
INTRAMUSCULAR | Status: DC | PRN
  Administered 2014-10-12 (×3): 50 ug via INTRAVENOUS

## 2014-10-12 MED ORDER — ONDANSETRON HCL 4 MG/2ML IJ SOLN
INTRAMUSCULAR | Status: AC
  Filled 2014-10-12: qty 2

## 2014-10-12 MED ORDER — BUPIVACAINE-EPINEPHRINE 0.25% -1:200000 IJ SOLN
INTRAMUSCULAR | Status: DC | PRN
  Administered 2014-10-12: 20 mL

## 2014-10-12 MED ORDER — BUPIVACAINE-EPINEPHRINE (PF) 0.25% -1:200000 IJ SOLN
INTRAMUSCULAR | Status: AC
  Filled 2014-10-12: qty 30

## 2014-10-12 MED ORDER — VENLAFAXINE HCL ER 150 MG PO CP24: 150.0000 mg | ORAL_CAPSULE | Freq: Every day | ORAL | Status: DC

## 2014-10-12 MED ORDER — ACETAMINOPHEN 10 MG/ML IV SOLN
1000.0000 mg | Freq: Once | INTRAVENOUS | Status: AC
  Administered 2014-10-12: 1000 mg via INTRAVENOUS

## 2014-10-12 MED ORDER — FENTANYL CITRATE (PF) 100 MCG/2ML IJ SOLN
25.0000 ug | INTRAMUSCULAR | Status: DC | PRN
  Administered 2014-10-12: 50 ug via INTRAVENOUS

## 2014-10-12 MED ORDER — DEXAMETHASONE SODIUM PHOSPHATE 10 MG/ML IJ SOLN
INTRAMUSCULAR | Status: AC
  Filled 2014-10-12: qty 1

## 2014-10-12 MED ORDER — DEXAMETHASONE SODIUM PHOSPHATE 10 MG/ML IJ SOLN: 10.0000 mg | Freq: Once | INTRAMUSCULAR | Status: DC

## 2014-10-12 MED ORDER — SODIUM CHLORIDE 0.9 % IV SOLN: INTRAVENOUS | Status: DC

## 2014-10-12 MED ORDER — METHOCARBAMOL 500 MG PO TABS
500.0000 mg | ORAL_TABLET | Freq: Once | ORAL | Status: AC
  Administered 2014-10-12: 500 mg via ORAL
  Filled 2014-10-12: qty 1

## 2014-10-12 MED ORDER — FENTANYL CITRATE (PF) 100 MCG/2ML IJ SOLN
INTRAMUSCULAR | Status: AC
  Filled 2014-10-12: qty 2

## 2014-10-12 MED ORDER — LIDOCAINE HCL (CARDIAC) 20 MG/ML IV SOLN
INTRAVENOUS | Status: AC
  Filled 2014-10-12: qty 5

## 2014-10-12 MED ORDER — PROPOFOL 10 MG/ML IV BOLUS
INTRAVENOUS | Status: DC | PRN
  Administered 2014-10-12: 30 mg via INTRAVENOUS
  Administered 2014-10-12: 150 mg via INTRAVENOUS

## 2014-10-12 MED ORDER — METHOCARBAMOL 500 MG PO TABS: 500.0000 mg | ORAL_TABLET | Freq: Four times a day (QID) | ORAL | Status: DC

## 2014-10-12 MED ORDER — LACTATED RINGERS IV SOLN
INTRAVENOUS | Status: DC
  Administered 2014-10-12: 14:00:00 via INTRAVENOUS

## 2014-10-12 MED ORDER — HYDROCODONE-ACETAMINOPHEN 5-325 MG PO TABS
1.0000 | ORAL_TABLET | Freq: Once | ORAL | Status: AC
  Administered 2014-10-12: 1 via ORAL
  Filled 2014-10-12: qty 1

## 2014-10-12 MED ORDER — LACTATED RINGERS IR SOLN
Status: DC | PRN
  Administered 2014-10-12: 9000 mL

## 2014-10-12 MED ORDER — LIDOCAINE HCL (CARDIAC) 20 MG/ML IV SOLN
INTRAVENOUS | Status: DC | PRN
Start: 2014-10-12 — End: 2014-10-12
  Administered 2014-10-12: 100 mg via INTRAVENOUS

## 2014-10-12 MED ORDER — PROPOFOL 10 MG/ML IV BOLUS
INTRAVENOUS | Status: AC
  Filled 2014-10-12: qty 20

## 2014-10-12 MED ORDER — FENTANYL CITRATE (PF) 250 MCG/5ML IJ SOLN
INTRAMUSCULAR | Status: AC
  Filled 2014-10-12: qty 25

## 2014-10-12 MED ORDER — CEFAZOLIN SODIUM-DEXTROSE 2-3 GM-% IV SOLR
2.0000 g | INTRAVENOUS | Status: AC
  Administered 2014-10-12: 2 g via INTRAVENOUS

## 2014-10-12 MED ORDER — HYDROCODONE-ACETAMINOPHEN 5-325 MG PO TABS: 1.0000 | ORAL_TABLET | Freq: Four times a day (QID) | ORAL | Status: DC | PRN

## 2014-10-12 MED ORDER — DEXAMETHASONE SODIUM PHOSPHATE 10 MG/ML IJ SOLN
INTRAMUSCULAR | Status: DC | PRN
  Administered 2014-10-12: 10 mg via INTRAVENOUS

## 2014-10-12 MED ORDER — CEFAZOLIN SODIUM-DEXTROSE 2-3 GM-% IV SOLR
INTRAVENOUS | Status: AC
  Filled 2014-10-12: qty 50

## 2014-10-12 MED ORDER — ONDANSETRON HCL 4 MG/2ML IJ SOLN
INTRAMUSCULAR | Status: DC | PRN
  Administered 2014-10-12: 4 mg via INTRAVENOUS

## 2014-10-12 SURGICAL SUPPLY — 28 items
BANDAGE ELASTIC 6 VELCRO ST LF (GAUZE/BANDAGES/DRESSINGS) ×2 IMPLANT
BLADE 4.2CUDA (BLADE) ×2 IMPLANT
CHLORAPREP W/TINT 26ML (MISCELLANEOUS) ×1 IMPLANT
COVER SURGICAL LIGHT HANDLE (MISCELLANEOUS) ×2 IMPLANT
CUFF TOURN SGL QUICK 34 (TOURNIQUET CUFF) ×2
CUFF TRNQT CYL 34X4X40X1 (TOURNIQUET CUFF) ×1 IMPLANT
DRAPE U-SHAPE 47X51 STRL (DRAPES) ×2 IMPLANT
DRSG EMULSION OIL 3X3 NADH (GAUZE/BANDAGES/DRESSINGS) ×2 IMPLANT
DRSG PAD ABDOMINAL 8X10 ST (GAUZE/BANDAGES/DRESSINGS) ×2 IMPLANT
DURAPREP 26ML APPLICATOR (WOUND CARE) ×1 IMPLANT
GAUZE SPONGE 4X4 12PLY STRL (GAUZE/BANDAGES/DRESSINGS) ×2 IMPLANT
GLOVE BIO SURGEON STRL SZ8 (GLOVE) ×2 IMPLANT
GLOVE BIOGEL PI IND STRL 8 (GLOVE) ×1 IMPLANT
GLOVE BIOGEL PI INDICATOR 8 (GLOVE) ×1
GOWN STRL REUS W/TWL LRG LVL3 (GOWN DISPOSABLE) ×2 IMPLANT
KIT BASIN OR (CUSTOM PROCEDURE TRAY) ×2 IMPLANT
MANIFOLD NEPTUNE II (INSTRUMENTS) ×2 IMPLANT
PACK ARTHROSCOPY WL (CUSTOM PROCEDURE TRAY) ×2 IMPLANT
PACK ICE MAXI GEL EZY WRAP (MISCELLANEOUS) ×6 IMPLANT
PADDING CAST COTTON 6X4 STRL (CAST SUPPLIES) ×3 IMPLANT
PEN SKIN MARKING BROAD (MISCELLANEOUS) ×2 IMPLANT
POSITIONER SURGICAL ARM (MISCELLANEOUS) ×2 IMPLANT
SET ARTHROSCOPY TUBING (MISCELLANEOUS) ×2
SET ARTHROSCOPY TUBING LN (MISCELLANEOUS) ×1 IMPLANT
SUT ETHILON 4 0 PS 2 18 (SUTURE) ×2 IMPLANT
TOWEL OR 17X26 10 PK STRL BLUE (TOWEL DISPOSABLE) ×2 IMPLANT
WAND 90 DEG TURBOVAC W/CORD (SURGICAL WAND) ×1 IMPLANT
WRAP KNEE MAXI GEL POST OP (GAUZE/BANDAGES/DRESSINGS) ×2 IMPLANT

## 2014-10-12 NOTE — Anesthesia Procedure Notes (Signed)
Procedure Name: LMA Insertion Date/Time: 10/12/2014 2:09 PM Performed by: Carleene Cooper A Pre-anesthesia Checklist: Patient identified, Timeout performed, Emergency Drugs available, Suction available and Patient being monitored Patient Re-evaluated:Patient Re-evaluated prior to inductionOxygen Delivery Method: Circle system utilized Preoxygenation: Pre-oxygenation with 100% oxygen Intubation Type: IV induction Ventilation: Mask ventilation without difficulty LMA: LMA with gastric port inserted LMA Size: 4.0 Number of attempts: 1 Placement Confirmation: positive ETCO2 and breath sounds checked- equal and bilateral Tube secured with: Tape Dental Injury: Teeth and Oropharynx as per pre-operative assessment

## 2014-10-12 NOTE — Anesthesia Postprocedure Evaluation (Signed)
  Anesthesia Post-op Note  Patient: Shannon [name]  Procedure(s) Performed: Procedure(s): LEFT KNEE ARTHROSCOPY  (Left) WITH SYNOVECTOMY (Left)  Patient Location: PACU  Anesthesia Type:General  Level of Consciousness: awake, alert  and oriented  Airway and Oxygen Therapy: Patient Spontanous Breathing  Post-op Pain: mild  Post-op Assessment: Post-op Vital signs reviewed and Patient's Cardiovascular Status Stable              Post-op Vital Signs: Reviewed and stable  Last Vitals:  Filed Vitals:   10/12/14 1657  BP: 138/74  Pulse:   Temp: 36.2 C  Resp: 16    Complications: No apparent anesthesia complications

## 2014-10-12 NOTE — Progress Notes (Signed)
Gauze to rt nare and nasal ice pack in place.  Will monitor.

## 2014-10-12 NOTE — Discharge Instructions (Signed)
° °Dr. Dyer Klug °Total Joint Specialist °[facility] °[address] °[place], [place] [address] °[phone] ° ° °Arthroscopic Procedure, Knee °An arthroscopic procedure can find what is wrong with your knee. °PROCEDURE °Arthroscopy is a surgical technique that allows your orthopedic surgeon to diagnose and treat your knee injury with accuracy. They will look into your knee through a small instrument. This is almost like a small (pencil sized) telescope. Because arthroscopy affects your knee less than open knee surgery, you can anticipate a more rapid recovery. Taking an active role by following your caregiver's instructions will help with rapid and complete recovery. Use crutches, rest, elevation, ice, and knee exercises as instructed. The length of recovery depends on various factors including type of injury, age, physical condition, medical conditions, and your rehabilitation. °Your knee is the joint between the large bones (femur and tibia) in your leg. Cartilage covers these bone ends which are smooth and slippery and allow your knee to bend and move smoothly. Two menisci, thick, semi-lunar shaped pads of cartilage which form a rim inside the joint, help absorb shock and stabilize your knee. Ligaments bind the bones together and support your knee joint. Muscles move the joint, help support your knee, and take stress off the joint itself. Because of this all programs and physical therapy to rehabilitate an injured or repaired knee require rebuilding and strengthening your muscles. °AFTER THE PROCEDURE °· After the procedure, you will be moved to a recovery area until most of the effects of the medication have worn off. Your caregiver will discuss the test results with you.  °· Only take over-the-counter or prescription medicines for pain, discomfort, or fever as directed by your caregiver.  °SEEK MEDICAL CARE IF:  °· You have increased bleeding from your wounds.  °· You see  redness, swelling, or have increasing pain in your wounds.  °· You have pus coming from your wound.  °· You have an oral temperature above 102° F (38.9° C).  °· You notice a bad smell coming from the wound or dressing.  °· You have severe pain with any motion of your knee.  °SEEK IMMEDIATE MEDICAL CARE IF:  °· You develop a rash.  °· You have difficulty breathing.  °· You have any allergic problems.  °FURTHER INSTRUCTIONS:  °· ICE to the affected knee every three hours for 30 minutes at a time and then as needed for pain and swelling.  Continue to use ice on the knee for pain and swelling from surgery. You may notice swelling that will progress down to the foot and ankle.  This is normal after surgery.  Elevate the leg when you are not up walking on it.   ° °DIET °You may resume your previous home diet once your are discharged from the hospital. ° °DRESSING / WOUND CARE / SHOWERING °You may start showering two days after being discharged home but do not submerge the incisions under water.  °Change dressing 48 hours after the procedure and then cover the small incisions with band aids until your follow up visit. °Change the surgical dressings daily and reapply a dry dressing each time.  ° °ACTIVITY °Walk with your walker as instructed. °Use walker as long as suggested by your caregivers. °Avoid periods of inactivity such as sitting longer than an hour when not asleep. This helps prevent blood clots.  °You may resume a sexual relationship in one month or when given the OK by your doctor.  °You may return to   work once you are cleared by your doctor.  °Do not drive a car for 6 weeks or until released by you surgeon.  °Do not drive while taking narcotics. ° °WEIGHT BEARING AS TOLERATED ° °POSTOPERATIVE CONSTIPATION PROTOCOL °Constipation - defined medically as fewer than three stools per week and severe constipation as less than one stool per week. ° °One of the most common issues patients have following surgery is  constipation.  Even if you have a regular bowel pattern at home, your normal regimen is likely to be disrupted due to multiple reasons following surgery.  Combination of anesthesia, postoperative narcotics, change in appetite and fluid intake all can affect your bowels.  In order to avoid complications following surgery, here are some recommendations in order to help you during your recovery period. ° °Colace (docusate) - Pick up an over-the-counter form of Colace or another stool softener and take twice a day as long as you are requiring postoperative pain medications.  Take with a full glass of water daily.  If you experience loose stools or diarrhea, hold the colace until you stool forms back up.  If your symptoms do not get better within 1 week or if they get worse, check with your doctor. ° °Dulcolax (bisacodyl) - Pick up over-the-counter and take as directed by the product packaging as needed to assist with the movement of your bowels.  Take with a full glass of water.  Use this product as needed if not relieved by Colace only.  ° °MiraLax (polyethylene glycol) - Pick up over-the-counter to have on hand.  MiraLax is a solution that will increase the amount of water in your bowels to assist with bowel movements.  Take as directed and can mix with a glass of water, juice, soda, coffee, or tea.  Take if you go more than two days without a movement. °Do not use MiraLax more than once per day. Call your doctor if you are still constipated or irregular after using this medication for 7 days in a row. ° °If you continue to have problems with postoperative constipation, please contact the office for further assistance and recommendations.  If you experience "the worst abdominal pain ever" or develop nausea or vomiting, please contact the office immediatly for further recommendations for treatment. ° °ITCHING ° If you experience itching with your medications, try taking only a single pain pill, or even half a pain pill  at a time.  You can also use Benadryl over the counter for itching or also to help with sleep.  ° °TED HOSE STOCKINGS °Wear the elastic stockings on both legs for three weeks following surgery during the day but you may remove then at night for sleeping. ° °MEDICATIONS °See your medication summary on the “After Visit Summary” that the nursing staff will review with you prior to discharge.  You may have some home medications which will be placed on hold until you complete the course of blood thinner medication.  It is important for you to complete the blood thinner medication as prescribed by your surgeon.  Continue your approved medications as instructed at time of discharge. °Do not drive while taking narcotics.  ° °PRECAUTIONS °If you experience chest pain or shortness of breath - call 911 immediately for transfer to the hospital emergency department.  °If you develop a fever greater that 101 F, purulent drainage from wound, increased redness or drainage from wound, foul odor from the wound/dressing, or calf pain - CONTACT YOUR SURGEON.   °                                                °  FOLLOW-UP APPOINTMENTS °Make sure you keep all of your appointments after your operation with your surgeon and caregivers. You should call the office at [phone]  and make an appointment for approximately one week after the date of your surgery or on the date instructed by your surgeon outlined in the "After Visit Summary". ° °RANGE OF MOTION AND STRENGTHENING EXERCISES  °Rehabilitation of the knee is important following a knee injury or an operation. After just a few days of immobilization, the muscles of the thigh which control the knee become weakened and shrink (atrophy). Knee exercises are designed to build up the tone and strength of the thigh muscles and to improve knee motion. Often times heat used for twenty to thirty minutes before working out will loosen up your tissues and help with improving the range of motion  but do not use heat for the first two weeks following surgery. These exercises can be done on a training (exercise) mat, on the floor, on a table or on a bed. Use what ever works the best and is most comfortable for you Knee exercises include: ° °QUAD STRENGTHENING EXERCISES °Strengthening Quadriceps Sets ° °Tighten muscles on top of thigh by pushing knees down into floor or table. °Hold for 20 seconds. Repeat 10 times. °Do 2 sessions per day. ° ° ° ° °Strengthening Terminal Knee Extension ° °With knee bent over bolster, straighten knee by tightening muscle on top of thigh. Be sure to keep bottom of knee on bolster. °Hold for 20 seconds. Repeat 10 times. °Do 2 sessions per day. ° ° °Straight Leg with Bent Knee ° °Lie on back with opposite leg bent. Keep involved knee slightly bent at knee and raise leg 4-6". Hold for 10 seconds. °Repeat 20 times per set. °Do 2 sets per session. °Do 2 sessions per day. ° °

## 2014-10-12 NOTE — Brief Op Note (Signed)
10/12/2014  2:53 PM  PATIENT:  Shannon [name]  70 y.o. female  PRE-OPERATIVE DIAGNOSIS:  LEFT KNEE PATELLA CLUNK SYNDROME   POST-OPERATIVE DIAGNOSIS:  LEFT KNEE PATELLA CLUNK SYNDROME   PROCEDURE:  Procedure(s): LEFT KNEE ARTHROSCOPY  (Left) WITH SYNOVECTOMY (Left)  SURGEON:  Surgeon(s) and Role:    * Gaynelle Arabian, MD - Primary  PHYSICIAN ASSISTANT:   ASSISTANTS: none   ANESTHESIA:   general  EBL:     LOCAL MEDICATIONS USED:  MARCAINE     COUNTS:  YES  TOURNIQUET:    DICTATION: .Other Dictation: Dictation Number [id number]  PLAN OF CARE: Discharge to home after PACU  PATIENT DISPOSITION:  PACU - hemodynamically stable.

## 2014-10-12 NOTE — Transfer of Care (Signed)
Immediate Anesthesia Transfer of Care Note  Patient: Shannon [name]  Procedure(s) Performed: Procedure(s): LEFT KNEE ARTHROSCOPY  (Left) WITH SYNOVECTOMY (Left)  Patient Location: PACU  Anesthesia Type:General  Level of Consciousness:  sedated, patient cooperative and responds to stimulation  Airway & Oxygen Therapy:Patient Spontanous Breathing and Patient connected to face mask oxgen  Post-op Assessment:  Report given to PACU RN and Post -op Vital signs reviewed and stable  Post vital signs:  Reviewed and stable  Last Vitals:  Filed Vitals:   10/12/14 1507  BP: 130/61  Pulse:   Temp: 36.3 C  Resp:     Complications: No apparent anesthesia complications

## 2014-10-12 NOTE — Interval H&P Note (Signed)
History and Physical Interval Note:  10/12/2014 1:47 PM  Shannon [name]  has presented today for surgery, with the diagnosis of LEFT KNEE PATELLA CLUNK SYNDROME   The various methods of treatment have been discussed with the patient and family. After consideration of risks, benefits and other options for treatment, the patient has consented to  Procedure(s): LEFT KNEE ARTHROSCOPY  (Left) WITH SYNOVECTOMY (Left) as a surgical intervention .  The patient's history has been reviewed, patient examined, no change in status, stable for surgery.  I have reviewed the patient's chart and labs.  Questions were answered to the patient's satisfaction.     Gearlean Alf

## 2014-10-12 NOTE — Progress Notes (Addendum)
Rt nare gauze removed. Will monitor

## 2014-10-12 NOTE — Progress Notes (Signed)
Report received from CRNA. Pt developed nose bleed rt nare after extubation.  Gauze in place.  Will monitor.

## 2014-10-12 NOTE — Anesthesia Preprocedure Evaluation (Addendum)
Anesthesia Evaluation  Patient identified by MRN, date of birth, ID band Patient awake    Reviewed: Allergy & Precautions, H&P , NPO status , Patient's Chart, lab work & pertinent test results  Airway Mallampati: II  TM Distance: >3 FB Neck ROM: Full    Dental no notable dental hx. (+) Dental Advisory Given, Teeth Intact   Pulmonary shortness of breath and with exertion, asthma ,    Pulmonary exam normal breath sounds clear to auscultation       Cardiovascular hypertension, Pt. on medications Normal cardiovascular exam Rhythm:Regular Rate:Normal  cardiomyopathy   Neuro/Psych negative neurological ROS  negative psych ROS   GI/Hepatic Neg liver ROS, GERD  Medicated,  Endo/Other  negative endocrine ROSMorbid obesity  Renal/GU negative Renal ROS  negative genitourinary   Musculoskeletal negative musculoskeletal ROS (+)   Abdominal   Peds negative pediatric ROS (+)  Hematology negative hematology ROS (+)   Anesthesia Other Findings   Reproductive/Obstetrics negative OB ROS                          Anesthesia Physical Anesthesia Plan  ASA: III  Anesthesia Plan: General   Post-op Pain Management:    Induction: Intravenous  Airway Management Planned: LMA  Additional Equipment:   Intra-op Plan:   Post-operative Plan:   Informed Consent:   Plan Discussed with: Surgeon  Anesthesia Plan Comments:         Anesthesia Quick Evaluation

## 2014-10-13 ENCOUNTER — Encounter (HOSPITAL_COMMUNITY): Payer: Self-pay | Admitting: Orthopedic Surgery

## 2014-10-13 NOTE — Op Note (Signed)
NAMEKENTLEY, Shannon [name]                 ACCOUNT NO.:  [id number]  MEDICAL RECORD NO.:  [id number]  LOCATION:  [facility]                         FACILITY:  [facility]  PHYSICIAN:  Gaynelle Arabian, M.D.    DATE OF BIRTH:  05-16-1945  DATE OF PROCEDURE:  10/12/2014 DATE OF DISCHARGE:  10/12/2014                              OPERATIVE REPORT   PREOPERATIVE DIAGNOSIS:  Left knee patellar clunk syndrome.  POSTOPERATIVE DIAGNOSIS:  Left knee patellar clunk syndrome.  PROCEDURE:  Left knee arthroscopy with synovectomy.  SURGEON:  Gaynelle Arabian, M.D.  ASSISTANT:  No assistant.  ANESTHESIA:  General.  ESTIMATED BLOOD LOSS:  Minimal.  DRAINS:  None.  COMPLICATIONS:  None.  CONDITION:  Stable to recovery.  BRIEF CLINICAL NOTE:  [name] is a 69 year old female, who had a left total knee arthroplasty done in the past couple years.  She was doing very well and has noted painful popping in the knee.  It is consistent with patellar clunk syndrome.  She presents now for arthroscopy and synovectomy.  PROCEDURE IN DETAIL:  After successful administration of general anesthetic, a tourniquet was placed high on her left thigh, and her left lower extremity was prepped and draped in usual sterile fashion. Standard superomedial and inferolateral incisions were made.  Inflow cannula passed through superomedial.  Camera passed through inferolateral.  Arthroscopic visualization proceeds.  There was a large amount of hypertrophic synovial tissue at the junction of the quadriceps tendon and patellar component.  This was essentially obliterating the suprapatellar pouch.  A superolateral portal was created with an 11 blade and then using combination of a shaver and the ArthroCare device, this synovium was debrided back to a normal tissue.  It was sealed off with the ArthroCare.  The suprapatellar pouch was reconstituted.  Medial and lateral gutters were visualized, and any abnormal tissue in those compartments  were removed.  The joint was again inspected.  No other abnormal tissue noted.  Arthroscopic equipments were removed from the lateral portals, which were closed with interrupted 4-0 nylon.  20 mL of 0.25% Marcaine with epi was injected through the inflow cannula and that was removed and that portal was closed with nylon.  A bulky sterile dressing was applied and she was then awakened and transported to recovery in stable condition.     Gaynelle Arabian, M.D.     FA/MEDQ  D:  10/12/2014  T:  10/13/2014  Job:  [id number]

## 2014-10-15 ENCOUNTER — Other Ambulatory Visit: Payer: Self-pay | Admitting: Family Medicine

## 2014-10-20 DIAGNOSIS — Z96652 Presence of left artificial knee joint: Secondary | ICD-10-CM | POA: Diagnosis not present

## 2014-10-20 DIAGNOSIS — Z471 Aftercare following joint replacement surgery: Secondary | ICD-10-CM | POA: Diagnosis not present

## 2014-11-10 DIAGNOSIS — Z4789 Encounter for other orthopedic aftercare: Secondary | ICD-10-CM | POA: Diagnosis not present

## 2014-11-24 ENCOUNTER — Other Ambulatory Visit (HOSPITAL_BASED_OUTPATIENT_CLINIC_OR_DEPARTMENT_OTHER): Payer: Medicare Other

## 2014-11-24 ENCOUNTER — Ambulatory Visit (HOSPITAL_BASED_OUTPATIENT_CLINIC_OR_DEPARTMENT_OTHER): Payer: Medicare Other | Admitting: Oncology

## 2014-11-24 ENCOUNTER — Telehealth: Payer: Self-pay | Admitting: Oncology

## 2014-11-24 VITALS — BP 130/75 | HR 79 | Temp 97.8°F | Resp 18 | Ht 63.0 in | Wt 212.3 lb

## 2014-11-24 DIAGNOSIS — Z853 Personal history of malignant neoplasm of breast: Secondary | ICD-10-CM

## 2014-11-24 DIAGNOSIS — C50311 Malignant neoplasm of lower-inner quadrant of right female breast: Secondary | ICD-10-CM

## 2014-11-24 LAB — COMPREHENSIVE METABOLIC PANEL (CC13)
ALK PHOS: 106 U/L (ref 40–150)
ALT: 31 U/L (ref 0–55)
ANION GAP: 6 meq/L (ref 3–11)
AST: 28 U/L (ref 5–34)
Albumin: 3.9 g/dL (ref 3.5–5.0)
BUN: 13.8 mg/dL (ref 7.0–26.0)
CALCIUM: 9.9 mg/dL (ref 8.4–10.4)
CHLORIDE: 107 meq/L (ref 98–109)
CO2: 30 meq/L — AB (ref 22–29)
CREATININE: 0.8 mg/dL (ref 0.6–1.1)
EGFR: 70 mL/min/{1.73_m2} — ABNORMAL LOW (ref >90)
Glucose: 105 mg/dl (ref 70–140)
POTASSIUM: 4.6 meq/L (ref 3.5–5.1)
Sodium: 143 mEq/L (ref 136–145)
Total Bilirubin: 0.32 mg/dL (ref 0.20–1.20)
Total Protein: 6.7 g/dL (ref 6.4–8.3)

## 2014-11-24 LAB — CBC WITH DIFFERENTIAL/PLATELET
BASO%: 1 % (ref 0.0–2.0)
BASOS ABS: 0.1 10*3/uL (ref 0.0–0.1)
EOS%: 7.2 % — AB (ref 0.0–7.0)
Eosinophils Absolute: 0.4 10*3/uL (ref 0.0–0.5)
HEMATOCRIT: 36.2 % (ref 34.8–46.6)
HGB: 12 g/dL (ref 11.6–15.9)
LYMPH%: 23.7 % (ref 14.0–49.7)
MCH: 29.3 pg (ref 25.1–34.0)
MCHC: 33 g/dL (ref 31.5–36.0)
MCV: 88.8 fL (ref 79.5–101.0)
MONO#: 0.5 10*3/uL (ref 0.1–0.9)
MONO%: 8.3 % (ref 0.0–14.0)
NEUT#: 3.4 10*3/uL (ref 1.5–6.5)
NEUT%: 59.8 % (ref 38.4–76.8)
PLATELETS: 237 10*3/uL (ref 145–400)
RBC: 4.08 10*6/uL (ref 3.70–5.45)
RDW: 13.8 % (ref 11.2–14.5)
WBC: 5.7 10*3/uL (ref 3.9–10.3)
lymph#: 1.3 10*3/uL (ref 0.9–3.3)

## 2014-11-24 NOTE — Telephone Encounter (Signed)
Appointments made and avs printed for patient,held 36min for mammo and have now faxed order for the breast center to call the patient

## 2014-11-24 NOTE — Progress Notes (Signed)
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  About a month ago though she felt a change in the lower aspect of the right breast and brought this to Dr. Dickie La attention.  He set her up for a diagnostic mammography on April 2011 and Dr. Mariea Clonts found an interval mass with ill defined margins, deep in the lower, inner right breast.  This was palpable at the 3:30 position, approximately 9 cm from the nipple.  There were no palpable right axillary lymph nodes and by ultrasound there were no abnormal lymph nodes in the right axilla.  The ultrasound did show a 1.7 cm, irregular hypoechoic mass, just beneath the skin.  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  The tumor cells were estrogen receptor and progesterone receptor negative, both at 0%.  The MIB-1 was elevated at 57% and the HER2/neu ratio by CISH was positive at 2.74.   Her subsequent history is as detailed below  INTERVAL HISTORY: Shannon [name] returns today for follow up of her breast cancer. Since her last visit here she had bilateral breast reduction under Cristine Polio. This has helped her back. She is not entirely satisfied with the cosmetic result because there is some asymmetry. The back is better though. She is having problems with her knees, and has had some shots as well as prior surgery repair. She goes to water aerobics and enjoys that quite a bit.  REVIEW OF SYSTEMS: Shannon [name] is most concerned about her Down syndrome son, who she says is becoming more and more  forgetful/demented. She gets very short of breath when climbing stairs. She has a history of interstitial cystitis. She has joint pains here in there which are not more intense or persistent than before. Overall a detailed review of systems today was stable except as noted  PAST MEDICAL HISTORY: Past Medical History  Diagnosis Date  . NICM (nonischemic cardiomyopathy) 03/08/2010    likely 2/2 chemotx - a. Echo 2012: EF 45-50%;  b. Lex MV 2/12:  low risk, apical defect (small area of ischemia vs shifting breast atten);  c.  Echo 7/12: Normal wall thickness, EF 60-65%, normal wall motion, grade 1 diastolic dysfunction, mild LAE, PASP 32;   d. Lex MV 11/13:  EF 76%, no ischemia  . GERD 08/11/2008  . HYPERLIPIDEMIA 08/11/2008  . HYPERTENSION 08/11/2008  . LIPOMA 02/10/2009  . PREDIABETES 06/15/2009  . 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 Carpal tunnel syndrome of left wrist   . Neuropathy   . Interstitial cystitis   . Brain tumor   . DEPRESSION 08/11/2008  . 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 Osteoporosis   . Hyponatremia 08/28/2011  . Anemia 08/28/2011  . History of chicken pox   . H/O measles   . H/O mumps   . Dermatitis   . OA (osteoarthritis) 09/07/2011  . Melanoma 2008    DR. Ronnald Ramp  . Shortness of breath   . Asthma   .  History of blood transfusion   . UTI (lower urinary tract infection) 03/03/2012  . Constipation 03/03/2012  . 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 Obesity 09/28/2014  Significant for history of melanoma removed from the patient's anterior chest about five years ago by Dr. Allyn Kenner.  We will try to get that report.  She also had a craniopharyngioma removed by Dr. Jovita Gamma about 11 years ago.  She required no radiation for this (she presented with headaches and amaurosis of the right eye).  She has just undergone carpal tunnel release on the left by Dr. Theodis Sato and is recovering well  from that.  She had a lipoma removed from her right thigh by Dr. Margot Chimes.  She has a history of cholecystectomy, a history of prior breast cyst removal, a history of interstitial cystitis, followed by Dr. Amalia Hailey, history of hypertension, history of hypercholesterolemia, history of mild GERD/HH, history of childhood asthma, resolved, history of osteoarthritis involving both knees followed by Dr. Gaynelle Arabian and history of osteopenias noted on bone density obtained 2009.  PAST SURGICAL HISTORY: Past Surgical History  Procedure Laterality Date  . Cholecystectomy    . Carpal tunnel release      L wrist, ulnar nerve moved  . Elbow surgery      left  . Tubal ligation  1997  . Lipoma excision  03/28/2009    right leg  . Portacath placement  may 2011  . Craniotomy for tumor  2000  . Tonsillectomy  1958  . Melanoma excision    . [place] cath    . Port-a-cath removal  11/30/2010    Streck  . Total knee raplacement  01-2012    Right Knee  . Total knee arthroplasty  02/05/2012    Procedure: TOTAL KNEE ARTHROPLASTY;  Surgeon: Gearlean Alf, MD;  Location: [facility];  Service: Orthopedics;  Laterality: Right;  . Total knee arthroplasty Left 02/10/2013    Procedure: LEFT TOTAL KNEE ARTHROPLASTY;  Surgeon: Gearlean Alf, MD;  Location: [facility];  Service: Orthopedics;  Laterality: Left;  . Left heart catheterization with coronary angiogram N/A 12/16/2013    Procedure: LEFT HEART CATHETERIZATION WITH CORONARY ANGIOGRAM;  Surgeon: Peter M [name], MD;  Location: [facility] CATH LAB;  Service: Cardiovascular;  Laterality: N/A;  . Breast lumpectomy  04/2009    RIGHT FOR BREAST CANCER-CHEMO/RADIATION X 1 YEAR  . Breast reduction surgery Bilateral 05/04/2014    Procedure: MAMMARY REDUCTION  (BREAST);  Surgeon: Cristine Polio, MD;  Location: [facility];  Service: Plastics;  Laterality: Bilateral;  . Knee arthroscopy Left 10/12/2014    Procedure: LEFT KNEE ARTHROSCOPY ;  Surgeon: Gaynelle Arabian, MD;  Location:  [facility];  Service: Orthopedics;  Laterality: Left;  . Synovectomy Left 10/12/2014    Procedure: WITH SYNOVECTOMY;  Surgeon: Gaynelle Arabian, MD;  Location: [facility];  Service: Orthopedics;  Laterality: Left;    FAMILY HISTORY Family History  Problem Relation Age of Onset  . Breast cancer Mother   . Lung cancer Mother   . Breast cancer Mother     sarcoma  . Hypertension Mother   . Colon cancer Neg Hx   . Prostate cancer Father   . Congestive Heart Failure Father   . Prostate cancer Father   . Prostate cancer Brother   . Prostate cancer Brother   . Stomach cancer Maternal Aunt   . Uterine cancer Maternal Aunt   . Down syndrome Son   . Heart disease Maternal Grandfather  MI  . Heart attack Father   The patient's father is alive at age 61.  He lives in a residential home, but the patient consider herself as his primary caregiver.  The patient's mother had breast cancer diagnosed at the age of 55.  She developed lower extremity sarcoma 10 years later, which eventually took her life.  The patient has one brother and one sister both in good health.  GYNECOLOGIC HISTORY: She is P3, first pregnancy to term age 89.  Change of life around age 56.  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  Her husband Deidre Ala is an Physicist, medical.  Daughter Adonis Brook is a Marine scientist at [employer] in [place].  Son Leroy Sea  works as an Chief Financial Officer, actually not with his dad.  The third child is Sherren Mocha, lives at home, has Down's syndrome.  The patient attends [employer].   ADVANCED DIRECTIVES: In place  HEALTH MAINTENANCE: Social History  Substance Use Topics  . Smoking status: Never Smoker   . Smokeless tobacco: Never Used  . Alcohol Use: No     Colonoscopy:  PAP:  Bone density:  Lipid panel:  Allergies  Allergen  Reactions  . Dilaudid [Hydromorphone Hcl] Itching    Current Outpatient Prescriptions  Medication Sig Dispense Refill  . alendronate (FOSAMAX) 70 MG tablet Take 1 tablet (70 mg total) by mouth every 7 (seven) days. Take with a full glass of water on an empty stomach. 4 tablet 11  . aspirin EC 81 MG tablet Take 81 mg by mouth daily.    . B Complex-C (SUPER B COMPLEX PO) Take 1 tablet by mouth daily.    . carvedilol (COREG) 6.25 MG tablet TAKE 1 TABLET (6.25 MG TOTAL) BY MOUTH 2 (TWO) TIMES DAILY WITH A MEAL. 60 tablet 5  . Cholecalciferol 2000 UNITS CAPS Take 2,000 Units by mouth daily.    . clonazePAM (KLONOPIN) 1 MG tablet TAKE 1/2 TABLET BY MOUTH AT BEDTIME. 30 tablet 0  . CRESTOR 20 MG tablet TAKE 1 TABLET (20 MG TOTAL) BY MOUTH AT BEDTIME. 30 tablet 5  . Ferrous Fumarate 324 (106 FE) MG TABS TAKE 1 TABLET (106 MG OF IRON TOTAL) BY MOUTH DAILY. 30 tablet 4  . folic acid (FOLVITE) 1 MG tablet TAKE 1 TABLET BY MOUTH EVERY DAY 30 tablet 5  . gabapentin (NEURONTIN) 300 MG capsule Take 300 mg by mouth 2 (two) times daily.     . hydrOXYzine (ATARAX/VISTARIL) 25 MG tablet Take 25 mg by mouth at bedtime.    . pentosan polysulfate (ELMIRON) 100 MG capsule Take 200 mg by mouth 2 (two) times daily.    . Probiotic Product (META BIOTIC/BIO-ACTIVE 12 PO) Take 1 capsule by mouth daily.    . psyllium (METAMUCIL SMOOTH TEXTURE) 28 % packet Take 1 packet by mouth daily as needed (constipation).    . ranitidine (ZANTAC) 150 MG tablet Take 1 tablet (150 mg total) by mouth 2 (two) times daily as needed for heartburn. 60 tablet 5  . telmisartan (MICARDIS) 80 MG tablet TAKE 1 TABLET (80 MG TOTAL) BY MOUTH DAILY. 30 tablet 5  . venlafaxine XR (EFFEXOR-XR) 150 MG 24 hr capsule Take 1 capsule (150 mg total) by mouth at bedtime. 30 capsule 5   No current facility-administered medications for this visit.    OBJECTIVE:  Middle-aged white woman    Filed Vitals:   11/24/14 1109  BP: 130/75  Pulse: 79  Temp: 97.8  F (36.6 C)  Resp: 18     Body mass index is 37.62 kg/(m^2).    ECOG FS: 1 Filed Weights   11/24/14 1109  Weight: 212 lb 4.8 oz (96.299 kg)   Sclerae unicteric, pupils round and equal Oropharynx clear and moist-- no thrush or other lesions No cervical or supraclavicular adenopathy Lungs no rales or rhonchi Heart regular rate and rhythm Abd soft, obese, nontender, positive bowel sounds MSK scoliosis but no focal spinal tenderness, no upper extremity lymphedema Neuro: nonfocal, well oriented, appropriate affect Breasts: The right breast is status post lumpectomy and radiation. There is no evidence of local recurrence. Both breasts are status post reduction mammoplasty. I think the cosmetic result is quite good. There is slight asymmetry in the breast size. The right axilla is benign. The left breast is otherwise entirely unremarkable   LAB RESULTS: Lab Results  Component Value Date   WBC 5.7 11/24/2014   NEUTROABS 3.4 11/24/2014   HGB 12.0 11/24/2014   HCT 36.2 11/24/2014   MCV 88.8 11/24/2014   PLT 237 11/24/2014      Chemistry      Component Value Date/Time   NA 141 09/28/2014 0803   NA 142 04/27/2014 1240   NA 144 10/20/2010 0854   K 4.1 09/28/2014 0803   K 4.4 04/27/2014 1240   K 4.2 10/20/2010 0854   CL 103 09/28/2014 0803   CL 107 04/08/2012 1018   CL 108 10/20/2010 0854   CO2 30 09/28/2014 0803   CO2 26 04/27/2014 1240   CO2 26 10/20/2010 0854   BUN 13 09/28/2014 0803   BUN 11.1 04/27/2014 1240   BUN 13 10/20/2010 0854   CREATININE 0.85 09/28/2014 0803   CREATININE 0.9 04/27/2014 1240   CREATININE 0.88 12/09/2013 1050      Component Value Date/Time   CALCIUM 9.3 09/28/2014 0803   CALCIUM 9.7 04/27/2014 1240   CALCIUM 9.3 03/08/2011 1447   CALCIUM 8.8 10/20/2010 0854   ALKPHOS 93 09/28/2014 0803   ALKPHOS 86 04/27/2014 1240   ALKPHOS 106* 10/20/2010 0854   AST 26 09/28/2014 0803   AST 28 04/27/2014 1240   AST 32 10/20/2010 0854   ALT 32 09/28/2014  0803   ALT 31 04/27/2014 1240   ALT 31 10/20/2010 0854   BILITOT 0.4 09/28/2014 0803   BILITOT 0.44 04/27/2014 1240   BILITOT 0.50 10/20/2010 0854       Lab Results  Component Value Date   LABCA2 19 10/03/2011     STUDIES: CLINICAL DATA: Three weeks of for foot pain without known injury ; symptoms are centered at the base of the first and second toes.  EXAM: LEFT FOOT - COMPLETE 3+ VIEW  COMPARISON: None in PACs  FINDINGS: The bones are mildly osteopenic. There is a mild hallux valgus contour of the first ray. The interphalangeal and metatarsophalangeal joints are reasonably well-maintained. There are mild degenerative changes of the tarsometatarsal joints. There is a plantar calcaneal spur. There is mild soft tissue swelling over the dorsum of the midfoot  IMPRESSION: There is no acute bony abnormality. There is a mild hallux valgus contour of the first ray.   Electronically Signed  By: David [name] M.D.  On: 09/28/2014 08:24   ASSESSMENT: 69 y.o. [facility] woman status post right breast biopsy April 2011 for a clinical T2 N0 grade 3 invasive  ductal carcinoma which was estrogen and progesterone receptor negative, HER-2/neu positive with an MIB-1 of 58%  (1) treated neoadjuvantly with multiple agents (due to tolerance issues):  Namely 2 cycles of standard dose paclitaxel/ carboplatin/ trastuzumab followed by 3 cycles of paclitaxel/ trastuzumab given weekly, the last of this given with weekly carboplatin, followed by 2 cycles of dose dense doxorubicin and cyclophosphamide with the doxorubicin given by continuous infusion all of this completed in August 2011  (2) status post definitive right lumpectomy and sentinel lymph node sampling September 2011 showing a complete pathologic response both in the breast and sampled lymph nodes.    (3) completed radiation therapy November 2011.    (4) continued to receive trastuzumab until January 2012 at which time it  was discontinued secondary to a drop in her ejection fraction which has now completely resolved.   PLAN:  Shannon [name] is now a little over 5 years out from her definitive surgery for her breast cancer, with no evidence of disease recurrence. This is particularly favorable because estrogen and progesterone receptor tumors, if they recur, tend to recur early.  Accordingly I am comfortable releasing her from follow-up with me.  I did discuss our new survivorship program and she is very interested in participating in that. Accordingly I have made her a return appointment here in April with our breast survivorship nurse practitioner and Shannon [name] can continue in our survivorship program as long as it is of value to her.  She has not had a mammogram yet this year because she had her reduction mammoplasty. She will be a year out from that procedure in April and so she will have her mammography at the breast Center the first week in April and see are nurse practitioner shortly after that  As far as breast cancer follow-up is concerned all Shannon [name] will need is yearly mammography and a yearly physician breast exam  I will be glad to see Shannon [name] at any point in the future, but as of now I am making no further routine appointments for her with me here  Teague C    11/24/2014

## 2014-11-26 ENCOUNTER — Other Ambulatory Visit: Payer: Self-pay | Admitting: Family Medicine

## 2014-11-26 NOTE — Telephone Encounter (Signed)
Requesting: clonazepam Contract None UDS  None Last OV  09/28/14 Last Refill   #30 with 0 refills 09/25/14  Please Advise

## 2014-11-27 NOTE — Telephone Encounter (Signed)
Faxed hardcopy for Clonazepam to [facility] in [place]

## 2014-12-01 NOTE — Progress Notes (Signed)
HPI: FU nonischemic cardiomyopathy. Patient had an echocardiogram in 2012 ejection fraction of 45-50%. Reduced LV function felt secondary to chemotherapy (herceptin). Last echocardiogram in July of 2012 showed normal LV function. Patient had a chest CT in October 2015. There was atherosclerosis of the thoracic aorta, great vessels of the mediastinum and the coronary arteries. Nuclear study October 2015 showed an ejection fraction of 64%. There is a small area of moderate apical ischemia report. Cardiac catheterization November 2015 showed no obstructive coronary disease. Ejection fraction 50-55%. Since last seen, the patient has dyspnea with more extreme activities but not with routine activities. It is relieved with rest. It is not associated with chest pain. There is no orthopnea, PND or pedal edema. There is no syncope or palpitations. There is no exertional chest pain.   Current Outpatient Prescriptions  Medication Sig Dispense Refill  . alendronate (FOSAMAX) 70 MG tablet Take 1 tablet (70 mg total) by mouth every 7 (seven) days. Take with a full glass of water on an empty stomach. 4 tablet 11  . aspirin EC 81 MG tablet Take 81 mg by mouth daily.    . B Complex-C (SUPER B COMPLEX PO) Take 1 tablet by mouth daily.    . carvedilol (COREG) 6.25 MG tablet TAKE 1 TABLET (6.25 MG TOTAL) BY MOUTH 2 (TWO) TIMES DAILY WITH A MEAL. 60 tablet 5  . Cholecalciferol 2000 UNITS CAPS Take 2,000 Units by mouth daily.    . clonazePAM (KLONOPIN) 1 MG tablet TAKE 1/2 TABLET BY MOUTH AT BEDTIME 30 tablet 0  . CRESTOR 20 MG tablet TAKE 1 TABLET (20 MG TOTAL) BY MOUTH AT BEDTIME. 30 tablet 5  . Ferrous Fumarate 324 (106 FE) MG TABS TAKE 1 TABLET (106 MG OF IRON TOTAL) BY MOUTH DAILY. 30 tablet 4  . folic acid (FOLVITE) 1 MG tablet TAKE 1 TABLET BY MOUTH EVERY DAY 30 tablet 5  . gabapentin (NEURONTIN) 300 MG capsule Take 300 mg by mouth 2 (two) times daily.     Marland Kitchen HYDROcodone-acetaminophen (NORCO/VICODIN) 5-325  MG tablet Take 1-2 tablets by mouth every 6 (six) hours as needed for moderate pain.   0  . hydrOXYzine (ATARAX/VISTARIL) 25 MG tablet Take 25 mg by mouth at bedtime.    . methocarbamol (ROBAXIN) 500 MG tablet Take 1 tablet by mouth 4 (four) times daily as needed for muscle spasms.   1  . pentosan polysulfate (ELMIRON) 100 MG capsule Take 200 mg by mouth 2 (two) times daily.    . Probiotic Product (META BIOTIC/BIO-ACTIVE 12 PO) Take 1 capsule by mouth daily.    . psyllium (METAMUCIL SMOOTH TEXTURE) 28 % packet Take 1 packet by mouth daily as needed (constipation).    . ranitidine (ZANTAC) 150 MG tablet Take 1 tablet (150 mg total) by mouth 2 (two) times daily as needed for heartburn. 60 tablet 5  . telmisartan (MICARDIS) 80 MG tablet TAKE 1 TABLET (80 MG TOTAL) BY MOUTH DAILY. 30 tablet 5  . venlafaxine XR (EFFEXOR-XR) 150 MG 24 hr capsule Take 1 capsule (150 mg total) by mouth at bedtime. 30 capsule 5   No current facility-administered medications for this visit.     Past Medical History  Diagnosis Date  . NICM (nonischemic cardiomyopathy) ([place]) 03/08/2010    likely 2/2 chemotx - a. Echo 2012: EF 45-50%;  b. Lex MV 2/12:  low risk, apical defect (small area of ischemia vs shifting breast atten);  c.  Echo 7/12: Normal wall  thickness, EF 60-65%, normal wall motion, grade 1 diastolic dysfunction, mild LAE, PASP 32;   d. Lex MV 11/13:  EF 76%, no ischemia  . GERD 08/11/2008  . HYPERLIPIDEMIA 08/11/2008  . HYPERTENSION 08/11/2008  . LIPOMA 02/10/2009  . PREDIABETES 06/15/2009  . Adult craniopharyngioma ([place]) 2000    pituitary  . Carpal tunnel syndrome of left wrist   . Neuropathy ([facility])   . Interstitial cystitis   . Brain tumor ([place])   . DEPRESSION 08/11/2008  . 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 Osteoporosis   . Hyponatremia 08/28/2011  . Anemia 08/28/2011  . History of chicken pox   . H/O measles   . H/O mumps   .  Dermatitis   . OA (osteoarthritis) 09/07/2011  . Melanoma ([place]) 2008    DR. Ronnald Ramp  . Shortness of breath   . Asthma   . History of blood transfusion   . UTI (lower urinary tract infection) 03/03/2012  . Constipation 03/03/2012  . 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 Obesity 09/28/2014    Past Surgical History  Procedure Laterality Date  . Cholecystectomy    . Carpal tunnel release      L wrist, ulnar nerve moved  . Elbow surgery      left  . Tubal ligation  1997  . Lipoma excision  03/28/2009    right leg  . Portacath placement  may 2011  . Craniotomy for tumor  2000  . Tonsillectomy  1958  . Melanoma excision    . [place] cath    . Port-a-cath removal  11/30/2010    Streck  . Total knee raplacement  01-2012    Right Knee  . Total knee arthroplasty  02/05/2012    Procedure: TOTAL KNEE ARTHROPLASTY;  Surgeon: Gearlean Alf, MD;  [facility];  Service: Orthopedics;  Laterality: Right;  . Total knee arthroplasty Left 02/10/2013    Procedure: LEFT TOTAL KNEE ARTHROPLASTY;  Surgeon: Gearlean Alf, MD;  [facility];  Service: Orthopedics;  Laterality: Left;  . Left heart catheterization with coronary angiogram N/A 12/16/2013    Procedure: LEFT HEART CATHETERIZATION WITH CORONARY ANGIOGRAM;  Surgeon: Peter M [name], MD;  Location: [facility] CATH LAB;  Service: Cardiovascular;  Laterality: N/A;  . Breast lumpectomy  04/2009    RIGHT FOR BREAST CANCER-CHEMO/RADIATION X 1 YEAR  . Breast reduction surgery Bilateral 05/04/2014    Procedure: MAMMARY REDUCTION  (BREAST);  Surgeon: Cristine Polio, MD;  Location: [facility];  Service: Plastics;  Laterality: Bilateral;  . Knee arthroscopy Left 10/12/2014    Procedure: LEFT KNEE ARTHROSCOPY ;  Surgeon: Gaynelle Arabian, MD;  [facility];  Service: Orthopedics;  Laterality: Left;  . Synovectomy Left 10/12/2014    Procedure: WITH SYNOVECTOMY;  Surgeon: Gaynelle Arabian, MD;  [facility];  Service: Orthopedics;  Laterality: Left;     Social History   Social History  . Marital Status: Married    Spouse Name: N/A  . Number of Children: 3  . Years of Education: N/A   Occupational History  . boutique owner    Social History Main Topics  . Smoking status: Never Smoker   . Smokeless tobacco: Never Used  . Alcohol Use: No  . Drug Use: No  . Sexual Activity: No   Other Topics Concern  . Not on file   Social History Narrative  ROS: no fevers or chills, productive cough, hemoptysis, dysphasia, odynophagia, melena, hematochezia, dysuria, hematuria, rash, seizure activity, orthopnea, PND, pedal edema, claudication. Remaining systems are negative.  Physical Exam: Well-developed well-nourished in no acute distress.  Skin is warm and dry.  HEENT is normal.  Neck is supple.  Chest is clear to auscultation with normal expansion.  Cardiovascular exam is regular rate and rhythm.  Abdominal exam nontender or distended. No masses palpated. Extremities show no edema. neuro grossly intact  ECG Sinus rhythm at a rate of 78. Low voltage. Cannot rule out prior inferior infarct. No change compared to November 2015.

## 2014-12-04 ENCOUNTER — Encounter: Payer: Self-pay | Admitting: Cardiology

## 2014-12-04 ENCOUNTER — Ambulatory Visit (INDEPENDENT_AMBULATORY_CARE_PROVIDER_SITE_OTHER): Payer: Medicare Other | Admitting: Cardiology

## 2014-12-04 VITALS — BP 140/80 | HR 78 | Ht 64.0 in | Wt 210.6 lb

## 2014-12-04 DIAGNOSIS — I10 Essential (primary) hypertension: Secondary | ICD-10-CM

## 2014-12-04 DIAGNOSIS — E782 Mixed hyperlipidemia: Secondary | ICD-10-CM | POA: Diagnosis not present

## 2014-12-04 DIAGNOSIS — I420 Dilated cardiomyopathy: Secondary | ICD-10-CM

## 2014-12-04 MED ORDER — CARVEDILOL 12.5 MG PO TABS: 12.5000 mg | ORAL_TABLET | Freq: Two times a day (BID) | ORAL | Status: DC

## 2014-12-04 NOTE — Assessment & Plan Note (Signed)
LV function improved. Continue beta blocker and ARB.

## 2014-12-04 NOTE — Assessment & Plan Note (Signed)
Continue statin. 

## 2014-12-04 NOTE — Assessment & Plan Note (Addendum)
Blood pressure borderline. Increase Coreg to 12.5 mg by mouth twice a day and follow.

## 2014-12-04 NOTE — Patient Instructions (Signed)
Medication Instructions:   INCREASE CARVEDILOL TO 12.5 MG TWICE DAILY= 2 OF THE 6.25 MG TWICE DAILY  Follow-Up:  Your physician wants you to follow-up in: Shannon [name] will receive a reminder letter in the mail two months in advance. If you don't receive a letter, please call our office to schedule the follow-up appointment.   If you need a refill on your cardiac medications before your next appointment, please call your pharmacy.

## 2014-12-21 ENCOUNTER — Encounter: Payer: Self-pay | Admitting: Family Medicine

## 2014-12-21 ENCOUNTER — Ambulatory Visit (INDEPENDENT_AMBULATORY_CARE_PROVIDER_SITE_OTHER): Payer: Medicare Other | Admitting: Family Medicine

## 2014-12-21 VITALS — BP 127/66 | HR 76 | Temp 98.2°F | Ht 64.0 in | Wt 211.1 lb

## 2014-12-21 DIAGNOSIS — Z96659 Presence of unspecified artificial knee joint: Secondary | ICD-10-CM

## 2014-12-21 DIAGNOSIS — Z23 Encounter for immunization: Secondary | ICD-10-CM | POA: Diagnosis not present

## 2014-12-21 DIAGNOSIS — Z0000 Encounter for general adult medical examination without abnormal findings: Secondary | ICD-10-CM | POA: Diagnosis not present

## 2014-12-21 DIAGNOSIS — E782 Mixed hyperlipidemia: Secondary | ICD-10-CM | POA: Diagnosis not present

## 2014-12-21 DIAGNOSIS — M810 Age-related osteoporosis without current pathological fracture: Secondary | ICD-10-CM

## 2014-12-21 DIAGNOSIS — R739 Hyperglycemia, unspecified: Secondary | ICD-10-CM | POA: Diagnosis not present

## 2014-12-21 DIAGNOSIS — Z1159 Encounter for screening for other viral diseases: Secondary | ICD-10-CM

## 2014-12-21 DIAGNOSIS — I10 Essential (primary) hypertension: Secondary | ICD-10-CM | POA: Diagnosis not present

## 2014-12-21 DIAGNOSIS — IMO0001 Reserved for inherently not codable concepts without codable children: Secondary | ICD-10-CM

## 2014-12-21 DIAGNOSIS — R7303 Prediabetes: Secondary | ICD-10-CM | POA: Diagnosis not present

## 2014-12-21 DIAGNOSIS — E669 Obesity, unspecified: Secondary | ICD-10-CM

## 2014-12-21 DIAGNOSIS — E871 Hypo-osmolality and hyponatremia: Secondary | ICD-10-CM

## 2014-12-21 DIAGNOSIS — T8482XS Fibrosis due to internal orthopedic prosthetic devices, implants and grafts, sequela: Secondary | ICD-10-CM

## 2014-12-21 MED ORDER — RANITIDINE HCL 150 MG PO TABS: 150.0000 mg | ORAL_TABLET | Freq: Two times a day (BID) | ORAL | Status: DC | PRN

## 2014-12-21 NOTE — Progress Notes (Signed)
Pre visit review using our clinic review tool, if applicable. No additional management support is needed unless otherwise documented below in the visit note. 

## 2014-12-21 NOTE — Patient Instructions (Signed)
Preventive Care for Adults, Female A healthy lifestyle and preventive care can promote health and wellness. Preventive health guidelines for women include the following key practices.  A routine yearly physical is a good way to check with your health care provider about your health and preventive screening. It is a chance to share any concerns and updates on your health and to receive a thorough exam.  Visit your dentist for a routine exam and preventive care every 6 months. Brush your teeth twice a day and floss once a day. Good oral hygiene prevents tooth decay and gum disease.  The frequency of eye exams is based on your age, health, family medical history, use of contact lenses, and other factors. Follow your health care provider's recommendations for frequency of eye exams.  Eat a healthy diet. Foods like vegetables, fruits, whole grains, low-fat dairy products, and lean protein foods contain the nutrients you need without too many calories. Decrease your intake of foods high in solid fats, added sugars, and salt. Eat the right amount of calories for you.Get information about a proper diet from your health care provider, if necessary.  Regular physical exercise is one of the most important things you can do for your health. Most adults should get at least 150 minutes of moderate-intensity exercise (any activity that increases your heart rate and causes you to sweat) each week. In addition, most adults need muscle-strengthening exercises on 2 or more days a week.  Maintain a healthy weight. The body mass index (BMI) is a screening tool to identify possible weight problems. It provides an estimate of body fat based on height and weight. Your health care provider can find your BMI and can help you achieve or maintain a healthy weight.For adults 20 years and older:  A BMI below 18.5 is considered underweight.  A BMI of 18.5 to 24.9 is normal.  A BMI of 25 to 29.9 is considered overweight.  A  BMI of 30 and above is considered obese.  Maintain normal blood lipids and cholesterol levels by exercising and minimizing your intake of saturated fat. Eat a balanced diet with plenty of fruit and vegetables. Blood tests for lipids and cholesterol should begin at age 45 and be repeated every 5 years. If your lipid or cholesterol levels are high, you are over 50, or you are at high risk for heart disease, you may need your cholesterol levels checked more frequently.Ongoing high lipid and cholesterol levels should be treated with medicines if diet and exercise are not working.  If you smoke, find out from your health care provider how to quit. If you do not use tobacco, do not start.  Lung cancer screening is recommended for adults aged 45-80 years who are at high risk for developing lung cancer because of a history of smoking. A yearly low-dose CT scan of the lungs is recommended for people who have at least a 30-pack-year history of smoking and are a current smoker or have quit within the past 15 years. A pack year of smoking is smoking an average of 1 pack of cigarettes a day for 1 year (for example: 1 pack a day for 30 years or 2 packs a day for 15 years). Yearly screening should continue until the smoker has stopped smoking for at least 15 years. Yearly screening should be stopped for people who develop a health problem that would prevent them from having lung cancer treatment.  If you are pregnant, do not drink alcohol. If you are  breastfeeding, be very cautious about drinking alcohol. If you are not pregnant and choose to drink alcohol, do not have more than 1 drink per day. One drink is considered to be 12 ounces (355 mL) of beer, 5 ounces (148 mL) of wine, or 1.5 ounces (44 mL) of liquor.  Avoid use of street drugs. Do not share needles with anyone. Ask for help if you need support or instructions about stopping the use of drugs.  High blood pressure causes heart disease and increases the risk  of stroke. Your blood pressure should be checked at least every 1 to 2 years. Ongoing high blood pressure should be treated with medicines if weight loss and exercise do not work.  If you are 55-79 years old, ask your health care provider if you should take aspirin to prevent strokes.  Diabetes screening is done by taking a blood sample to check your blood glucose level after you have not eaten for a certain period of time (fasting). If you are not overweight and you do not have risk factors for diabetes, you should be screened once every 3 years starting at age 45. If you are overweight or obese and you are 40-70 years of age, you should be screened for diabetes every year as part of your cardiovascular risk assessment.  Breast cancer screening is essential preventive care for women. You should practice "breast self-awareness." This means understanding the normal appearance and feel of your breasts and may include breast self-examination. Any changes detected, no matter how small, should be reported to a health care provider. Women in their 20s and 30s should have a clinical breast exam (CBE) by a health care provider as part of a regular health exam every 1 to 3 years. After age 40, women should have a CBE every year. Starting at age 40, women should consider having a mammogram (breast X-ray test) every year. Women who have a family history of breast cancer should talk to their health care provider about genetic screening. Women at a high risk of breast cancer should talk to their health care providers about having an MRI and a mammogram every year.  Breast cancer gene (BRCA)-related cancer risk assessment is recommended for women who have family members with BRCA-related cancers. BRCA-related cancers include breast, ovarian, tubal, and peritoneal cancers. Having family members with these cancers may be associated with an increased risk for harmful changes (mutations) in the breast cancer genes BRCA1 and  BRCA2. Results of the assessment will determine the need for genetic counseling and BRCA1 and BRCA2 testing.  Your health care provider may recommend that you be screened regularly for cancer of the pelvic organs (ovaries, uterus, and vagina). This screening involves a pelvic examination, including checking for microscopic changes to the surface of your cervix (Pap test). You may be encouraged to have this screening done every 3 years, beginning at age 21.  For women ages 30-65, health care providers may recommend pelvic exams and Pap testing every 3 years, or they may recommend the Pap and pelvic exam, combined with testing for human papilloma virus (HPV), every 5 years. Some types of HPV increase your risk of cervical cancer. Testing for HPV may also be done on women of any age with unclear Pap test results.  Other health care providers may not recommend any screening for nonpregnant women who are considered low risk for pelvic cancer and who do not have symptoms. Ask your health care provider if a screening pelvic exam is right for   you.  If you have had past treatment for cervical cancer or a condition that could lead to cancer, you need Pap tests and screening for cancer for at least 20 years after your treatment. If Pap tests have been discontinued, your risk factors (such as having a new sexual partner) need to be reassessed to determine if screening should resume. Some women have medical problems that increase the chance of getting cervical cancer. In these cases, your health care provider may recommend more frequent screening and Pap tests.  Colorectal cancer can be detected and often prevented. Most routine colorectal cancer screening begins at the age of 50 years and continues through age 75 years. However, your health care provider may recommend screening at an earlier age if you have risk factors for colon cancer. On a yearly basis, your health care provider may provide home test kits to check  for hidden blood in the stool. Use of a small camera at the end of a tube, to directly examine the colon (sigmoidoscopy or colonoscopy), can detect the earliest forms of colorectal cancer. Talk to your health care provider about this at age 50, when routine screening begins. Direct exam of the colon should be repeated every 5-10 years through age 75 years, unless early forms of precancerous polyps or small growths are found.  People who are at an increased risk for hepatitis B should be screened for this virus. You are considered at high risk for hepatitis B if:  You were born in a country where hepatitis B occurs often. Talk with your health care provider about which countries are considered high risk.  Your parents were born in a high-risk country and you have not received a shot to protect against hepatitis B (hepatitis B vaccine).  You have HIV or AIDS.  You use needles to inject street drugs.  You live with, or have sex with, someone who has hepatitis B.  You get hemodialysis treatment.  You take certain medicines for conditions like cancer, organ transplantation, and autoimmune conditions.  Hepatitis C blood testing is recommended for all people born from 1945 through 1965 and any individual with known risks for hepatitis C.  Practice safe sex. Use condoms and avoid high-risk sexual practices to reduce the spread of sexually transmitted infections (STIs). STIs include gonorrhea, chlamydia, syphilis, trichomonas, herpes, HPV, and human immunodeficiency virus (HIV). Herpes, HIV, and HPV are viral illnesses that have no cure. They can result in disability, cancer, and death.  You should be screened for sexually transmitted illnesses (STIs) including gonorrhea and chlamydia if:  You are sexually active and are younger than 24 years.  You are older than 24 years and your health care provider tells you that you are at risk for this type of infection.  Your sexual activity has changed  since you were last screened and you are at an increased risk for chlamydia or gonorrhea. Ask your health care provider if you are at risk.  If you are at risk of being infected with HIV, it is recommended that you take a prescription medicine daily to prevent HIV infection. This is called preexposure prophylaxis (PrEP). You are considered at risk if:  You are sexually active and do not regularly use condoms or know the HIV status of your partner(s).  You take drugs by injection.  You are sexually active with a partner who has HIV.  Talk with your health care provider about whether you are at high risk of being infected with HIV. If   you choose to begin PrEP, you should first be tested for HIV. You should then be tested every 3 months for as long as you are taking PrEP.  Osteoporosis is a disease in which the bones lose minerals and strength with aging. This can result in serious bone fractures or breaks. The risk of osteoporosis can be identified using a bone density scan. Women ages 67 years and over and women at risk for fractures or osteoporosis should discuss screening with their health care providers. Ask your health care provider whether you should take a calcium supplement or vitamin D to reduce the rate of osteoporosis.  Menopause can be associated with physical symptoms and risks. Hormone replacement therapy is available to decrease symptoms and risks. You should talk to your health care provider about whether hormone replacement therapy is right for you.  Use sunscreen. Apply sunscreen liberally and repeatedly throughout the day. You should seek shade when your shadow is shorter than you. Protect yourself by wearing long sleeves, pants, a wide-brimmed hat, and sunglasses year round, whenever you are outdoors.  Once a month, do a whole body skin exam, using a mirror to look at the skin on your back. Tell your health care provider of new moles, moles that have irregular borders, moles that  are larger than a pencil eraser, or moles that have changed in shape or color.  Stay current with required vaccines (immunizations).  Influenza vaccine. All adults should be immunized every year.  Tetanus, diphtheria, and acellular pertussis (Td, Tdap) vaccine. Pregnant women should receive 1 dose of Tdap vaccine during each pregnancy. The dose should be obtained regardless of the length of time since the last dose. Immunization is preferred during the 27th-36th week of gestation. An adult who has not previously received Tdap or who does not know her vaccine status should receive 1 dose of Tdap. This initial dose should be followed by tetanus and diphtheria toxoids (Td) booster doses every 10 years. Adults with an unknown or incomplete history of completing a 3-dose immunization series with Td-containing vaccines should begin or complete a primary immunization series including a Tdap dose. Adults should receive a Td booster every 10 years.  Varicella vaccine. An adult without evidence of immunity to varicella should receive 2 doses or a second dose if she has previously received 1 dose. Pregnant females who do not have evidence of immunity should receive the first dose after pregnancy. This first dose should be obtained before leaving the health care facility. The second dose should be obtained 4-8 weeks after the first dose.  Human papillomavirus (HPV) vaccine. Females aged 13-26 years who have not received the vaccine previously should obtain the 3-dose series. The vaccine is not recommended for use in pregnant females. However, pregnancy testing is not needed before receiving a dose. If a female is found to be pregnant after receiving a dose, no treatment is needed. In that case, the remaining doses should be delayed until after the pregnancy. Immunization is recommended for any person with an immunocompromised condition through the age of 61 years if she did not get any or all doses earlier. During the  3-dose series, the second dose should be obtained 4-8 weeks after the first dose. The third dose should be obtained 24 weeks after the first dose and 16 weeks after the second dose.  Zoster vaccine. One dose is recommended for adults aged 30 years or older unless certain conditions are present.  Measles, mumps, and rubella (MMR) vaccine. Adults born  before 1957 generally are considered immune to measles and mumps. Adults born in 1957 or later should have 1 or more doses of MMR vaccine unless there is a contraindication to the vaccine or there is laboratory evidence of immunity to each of the three diseases. A routine second dose of MMR vaccine should be obtained at least 28 days after the first dose for students attending postsecondary schools, health care workers, or international travelers. People who received inactivated measles vaccine or an unknown type of measles vaccine during 1963-1967 should receive 2 doses of MMR vaccine. People who received inactivated mumps vaccine or an unknown type of mumps vaccine before 1979 and are at high risk for mumps infection should consider immunization with 2 doses of MMR vaccine. For females of childbearing age, rubella immunity should be determined. If there is no evidence of immunity, females who are not pregnant should be vaccinated. If there is no evidence of immunity, females who are pregnant should delay immunization until after pregnancy. Unvaccinated health care workers born before 1957 who lack laboratory evidence of measles, mumps, or rubella immunity or laboratory confirmation of disease should consider measles and mumps immunization with 2 doses of MMR vaccine or rubella immunization with 1 dose of MMR vaccine.  Pneumococcal 13-valent conjugate (PCV13) vaccine. When indicated, a person who is uncertain of his immunization history and has no record of immunization should receive the PCV13 vaccine. All adults 65 years of age and older should receive this  vaccine. An adult aged 19 years or older who has certain medical conditions and has not been previously immunized should receive 1 dose of PCV13 vaccine. This PCV13 should be followed with a dose of pneumococcal polysaccharide (PPSV23) vaccine. Adults who are at high risk for pneumococcal disease should obtain the PPSV23 vaccine at least 8 weeks after the dose of PCV13 vaccine. Adults older than 70 years of age who have normal immune system function should obtain the PPSV23 vaccine dose at least 1 year after the dose of PCV13 vaccine.  Pneumococcal polysaccharide (PPSV23) vaccine. When PCV13 is also indicated, PCV13 should be obtained first. All adults aged 65 years and older should be immunized. An adult younger than age 65 years who has certain medical conditions should be immunized. Any person who resides in a nursing home or long-term care facility should be immunized. An adult smoker should be immunized. People with an immunocompromised condition and certain other conditions should receive both PCV13 and PPSV23 vaccines. People with human immunodeficiency virus (HIV) infection should be immunized as soon as possible after diagnosis. Immunization during chemotherapy or radiation therapy should be avoided. Routine use of PPSV23 vaccine is not recommended for American Indians, [place] Natives, or people younger than 65 years unless there are medical conditions that require PPSV23 vaccine. When indicated, people who have unknown immunization and have no record of immunization should receive PPSV23 vaccine. One-time revaccination 5 years after the first dose of PPSV23 is recommended for people aged 19-64 years who have chronic kidney failure, nephrotic syndrome, asplenia, or immunocompromised conditions. People who received 1-2 doses of PPSV23 before age 65 years should receive another dose of PPSV23 vaccine at age 65 years or later if at least 5 years have passed since the previous dose. Doses of PPSV23 are not  needed for people immunized with PPSV23 at or after age 65 years.  Meningococcal vaccine. Adults with asplenia or persistent complement component deficiencies should receive 2 doses of quadrivalent meningococcal conjugate (MenACWY-D) vaccine. The doses should be obtained   at least 2 months apart. Microbiologists working with certain meningococcal bacteria, [place] recruits, people at risk during an outbreak, and people who travel to or live in countries with a high rate of meningitis should be immunized. A first-year college student up through age 34 years who is living in a residence hall should receive a dose if she did not receive a dose on or after her 16th birthday. Adults who have certain high-risk conditions should receive one or more doses of vaccine.  Hepatitis A vaccine. Adults who wish to be protected from this disease, have certain high-risk conditions, work with hepatitis A-infected animals, work in hepatitis A research labs, or travel to or work in countries with a high rate of hepatitis A should be immunized. Adults who were previously unvaccinated and who anticipate close contact with an international adoptee during the first 60 days after arrival in the [place] from a country with a high rate of hepatitis A should be immunized.  Hepatitis B vaccine. Adults who wish to be protected from this disease, have certain high-risk conditions, may be exposed to blood or other infectious body fluids, are household contacts or sex partners of hepatitis B positive people, are clients or workers in certain care facilities, or travel to or work in countries with a high rate of hepatitis B should be immunized.  Haemophilus influenzae type b (Hib) vaccine. A previously unvaccinated person with asplenia or sickle cell disease or having a scheduled splenectomy should receive 1 dose of Hib vaccine. Regardless of previous immunization, a recipient of a hematopoietic stem cell transplant should receive a  3-dose series 6-12 months after her successful transplant. Hib vaccine is not recommended for adults with HIV infection. Preventive Services / Frequency Ages 35 to 4 years  Blood pressure check.** / Every 3-5 years.  Lipid and cholesterol check.** / Every 5 years beginning at age 60.  Clinical breast exam.** / Every 3 years for women in their 71s and 10s.  BRCA-related cancer risk assessment.** / For women who have family members with a BRCA-related cancer (breast, ovarian, tubal, or peritoneal cancers).  Pap test.** / Every 2 years from ages 76 through 26. Every 3 years starting at age 61 through age 76 or 93 with a history of 3 consecutive normal Pap tests.  HPV screening.** / Every 3 years from ages 37 through ages 60 to 51 with a history of 3 consecutive normal Pap tests.  Hepatitis C blood test.** / For any individual with known risks for hepatitis C.  Skin self-exam. / Monthly.  Influenza vaccine. / Every year.  Tetanus, diphtheria, and acellular pertussis (Tdap, Td) vaccine.** / Consult your health care provider. Pregnant women should receive 1 dose of Tdap vaccine during each pregnancy. 1 dose of Td every 10 years.  Varicella vaccine.** / Consult your health care provider. Pregnant females who do not have evidence of immunity should receive the first dose after pregnancy.  HPV vaccine. / 3 doses over 6 months, if 93 and younger. The vaccine is not recommended for use in pregnant females. However, pregnancy testing is not needed before receiving a dose.  Measles, mumps, rubella (MMR) vaccine.** / You need at least 1 dose of MMR if you were born in 1957 or later. You may also need a 2nd dose. For females of childbearing age, rubella immunity should be determined. If there is no evidence of immunity, females who are not pregnant should be vaccinated. If there is no evidence of immunity, females who are  pregnant should delay immunization until after pregnancy.  Pneumococcal  13-valent conjugate (PCV13) vaccine.** / Consult your health care provider.  Pneumococcal polysaccharide (PPSV23) vaccine.** / 1 to 2 doses if you smoke cigarettes or if you have certain conditions.  Meningococcal vaccine.** / 1 dose if you are age 68 to 8 years and a Market researcher living in a residence hall, or have one of several medical conditions, you need to get vaccinated against meningococcal disease. You may also need additional booster doses.  Hepatitis A vaccine.** / Consult your health care provider.  Hepatitis B vaccine.** / Consult your health care provider.  Haemophilus influenzae type b (Hib) vaccine.** / Consult your health care provider. Ages 7 to 53 years  Blood pressure check.** / Every year.  Lipid and cholesterol check.** / Every 5 years beginning at age 25 years.  Lung cancer screening. / Every year if you are aged 11-80 years and have a 30-pack-year history of smoking and currently smoke or have quit within the past 15 years. Yearly screening is stopped once you have quit smoking for at least 15 years or develop a health problem that would prevent you from having lung cancer treatment.  Clinical breast exam.** / Every year after age 48 years.  BRCA-related cancer risk assessment.** / For women who have family members with a BRCA-related cancer (breast, ovarian, tubal, or peritoneal cancers).  Mammogram.** / Every year beginning at age 41 years and continuing for as long as you are in good health. Consult with your health care provider.  Pap test.** / Every 3 years starting at age 65 years through age 37 or 70 years with a history of 3 consecutive normal Pap tests.  HPV screening.** / Every 3 years from ages 72 years through ages 60 to 40 years with a history of 3 consecutive normal Pap tests.  Fecal occult blood test (FOBT) of stool. / Every year beginning at age 21 years and continuing until age 5 years. You may not need to do this test if you get  a colonoscopy every 10 years.  Flexible sigmoidoscopy or colonoscopy.** / Every 5 years for a flexible sigmoidoscopy or every 10 years for a colonoscopy beginning at age 35 years and continuing until age 48 years.  Hepatitis C blood test.** / For all people born from [birth year] through 1965 and any individual with known risks for hepatitis C.  Skin self-exam. / Monthly.  Influenza vaccine. / Every year.  Tetanus, diphtheria, and acellular pertussis (Tdap/Td) vaccine.** / Consult your health care provider. Pregnant women should receive 1 dose of Tdap vaccine during each pregnancy. 1 dose of Td every 10 years.  Varicella vaccine.** / Consult your health care provider. Pregnant females who do not have evidence of immunity should receive the first dose after pregnancy.  Zoster vaccine.** / 1 dose for adults aged 30 years or older.  Measles, mumps, rubella (MMR) vaccine.** / You need at least 1 dose of MMR if you were born in 1957 or later. You may also need a second dose. For females of childbearing age, rubella immunity should be determined. If there is no evidence of immunity, females who are not pregnant should be vaccinated. If there is no evidence of immunity, females who are pregnant should delay immunization until after pregnancy.  Pneumococcal 13-valent conjugate (PCV13) vaccine.** / Consult your health care provider.  Pneumococcal polysaccharide (PPSV23) vaccine.** / 1 to 2 doses if you smoke cigarettes or if you have certain conditions.  Meningococcal vaccine.** /  Consult your health care provider.  Hepatitis A vaccine.** / Consult your health care provider.  Hepatitis B vaccine.** / Consult your health care provider.  Haemophilus influenzae type b (Hib) vaccine.** / Consult your health care provider. Ages 64 years and over  Blood pressure check.** / Every year.  Lipid and cholesterol check.** / Every 5 years beginning at age 23 years.  Lung cancer screening. / Every year if you  are aged 16-80 years and have a 30-pack-year history of smoking and currently smoke or have quit within the past 15 years. Yearly screening is stopped once you have quit smoking for at least 15 years or develop a health problem that would prevent you from having lung cancer treatment.  Clinical breast exam.** / Every year after age 74 years.  BRCA-related cancer risk assessment.** / For women who have family members with a BRCA-related cancer (breast, ovarian, tubal, or peritoneal cancers).  Mammogram.** / Every year beginning at age 44 years and continuing for as long as you are in good health. Consult with your health care provider.  Pap test.** / Every 3 years starting at age 58 years through age 22 or 39 years with 3 consecutive normal Pap tests. Testing can be stopped between 65 and 70 years with 3 consecutive normal Pap tests and no abnormal Pap or HPV tests in the past 10 years.  HPV screening.** / Every 3 years from ages 64 years through ages 70 or 61 years with a history of 3 consecutive normal Pap tests. Testing can be stopped between 65 and 70 years with 3 consecutive normal Pap tests and no abnormal Pap or HPV tests in the past 10 years.  Fecal occult blood test (FOBT) of stool. / Every year beginning at age 40 years and continuing until age 27 years. You may not need to do this test if you get a colonoscopy every 10 years.  Flexible sigmoidoscopy or colonoscopy.** / Every 5 years for a flexible sigmoidoscopy or every 10 years for a colonoscopy beginning at age 7 years and continuing until age 32 years.  Hepatitis C blood test.** / For all people born from [birth year] through 1966 and any individual with known risks for hepatitis C.  Osteoporosis screening.** / A one-time screening for women ages 30 years and over and women at risk for fractures or osteoporosis.  Skin self-exam. / Monthly.  Influenza vaccine. / Every year.  Tetanus, diphtheria, and acellular pertussis (Tdap/Td)  vaccine.** / 1 dose of Td every 10 years.  Varicella vaccine.** / Consult your health care provider.  Zoster vaccine.** / 1 dose for adults aged 35 years or older.  Pneumococcal 13-valent conjugate (PCV13) vaccine.** / Consult your health care provider.  Pneumococcal polysaccharide (PPSV23) vaccine.** / 1 dose for all adults aged 46 years and older.  Meningococcal vaccine.** / Consult your health care provider.  Hepatitis A vaccine.** / Consult your health care provider.  Hepatitis B vaccine.** / Consult your health care provider.  Haemophilus influenzae type b (Hib) vaccine.** / Consult your health care provider. ** Family history and personal history of risk and conditions may change your health care provider's recommendations.   This information is not intended to replace advice given to you by your health care provider. Make sure you discuss any questions you have with your health care provider.   Document Released: 03/14/2001 Document Revised: 02/06/2014 Document Reviewed: 06/13/2010 Elsevier Interactive Patient Education [employer].

## 2014-12-22 LAB — COMPREHENSIVE METABOLIC PANEL
ALK PHOS: 99 U/L (ref 39–117)
ALT: 27 U/L (ref 0–35)
AST: 27 U/L (ref 0–37)
Albumin: 4.1 g/dL (ref 3.5–5.2)
BILIRUBIN TOTAL: 0.4 mg/dL (ref 0.2–1.2)
BUN: 13 mg/dL (ref 6–23)
CALCIUM: 9.8 mg/dL (ref 8.4–10.5)
CO2: 32 meq/L (ref 19–32)
Chloride: 105 mEq/L (ref 96–112)
Creatinine, Ser: 0.8 mg/dL (ref 0.40–1.20)
GFR: 75.25 mL/min (ref >60.00)
Glucose, Bld: 81 mg/dL (ref 70–99)
POTASSIUM: 4.4 meq/L (ref 3.5–5.1)
Sodium: 144 mEq/L (ref 135–145)
Total Protein: 6.9 g/dL (ref 6.0–8.3)

## 2014-12-22 LAB — CBC
HEMATOCRIT: 36.8 % (ref 36.0–46.0)
HEMOGLOBIN: 11.9 g/dL — AB (ref 12.0–15.0)
MCHC: 32.5 g/dL (ref 30.0–36.0)
MCV: 88.9 fl (ref 78.0–100.0)
PLATELETS: 235 10*3/uL (ref 150.0–400.0)
RBC: 4.14 Mil/uL (ref 3.87–5.11)
RDW: 14 % (ref 11.5–15.5)
WBC: 6.2 10*3/uL (ref 4.0–10.5)

## 2014-12-22 LAB — LIPID PANEL
CHOL/HDL RATIO: 5
CHOLESTEROL: 200 mg/dL (ref 0–200)
HDL: 39.9 mg/dL (ref >39.00)
NonHDL: 160.43
TRIGLYCERIDES: 281 mg/dL — AB (ref 0.0–149.0)
VLDL: 56.2 mg/dL — ABNORMAL HIGH (ref 0.0–40.0)

## 2014-12-22 LAB — TSH: TSH: 2.15 u[IU]/mL (ref 0.35–4.50)

## 2014-12-22 LAB — HEPATITIS C ANTIBODY: HCV AB: NEGATIVE

## 2014-12-22 LAB — VITAMIN D 25 HYDROXY (VIT D DEFICIENCY, FRACTURES): VITD: 35.94 ng/mL (ref 30.00–100.00)

## 2014-12-22 LAB — HEMOGLOBIN A1C: Hgb A1c MFr Bld: 5.9 % (ref 4.6–6.5)

## 2014-12-22 LAB — LDL CHOLESTEROL, DIRECT: LDL DIRECT: 120 mg/dL

## 2014-12-27 ENCOUNTER — Encounter: Payer: Self-pay | Admitting: Family Medicine

## 2014-12-27 DIAGNOSIS — Z0000 Encounter for general adult medical examination without abnormal findings: Secondary | ICD-10-CM | POA: Insufficient documentation

## 2014-12-27 HISTORY — DX: Encounter for general adult medical examination without abnormal findings: Z00.00

## 2014-12-27 NOTE — Assessment & Plan Note (Signed)
minimize simple carbs. Increase exercise as tolerated.  

## 2014-12-27 NOTE — Progress Notes (Signed)
Subjective:    Patient ID: Shannon [name], female    DOB: 1945/11/12, 69 y.o.   MRN: [id number]  Chief Complaint  Patient presents with  . Medicare Wellness    HPI Patient is in today for annual exam and follow-up on numerous concerns. She continues to struggle with stress as she cares for her adult son with disabilities but has no acute illness herself more acute concern. Continues to struggle with knee pain although she is noting improvement with procedure performed by orthopedist. Continues to struggle with urinary frequency but follows with urology. Denies CP/palp/SOB/HA/congestion/fevers/GI or GU c/o. Taking meds as prescribed  Past Medical History  Diagnosis Date  . NICM (nonischemic cardiomyopathy) ([place]) 03/08/2010    likely 2/2 chemotx - a. Echo 2012: EF 45-50%;  b. Lex MV 2/12:  low risk, apical defect (small area of ischemia vs shifting breast atten);  c.  Echo 7/12: Normal wall thickness, EF 60-65%, normal wall motion, grade 1 diastolic dysfunction, mild LAE, PASP 32;   d. Lex MV 11/13:  EF 76%, no ischemia  . GERD 08/11/2008  . HYPERLIPIDEMIA 08/11/2008  . HYPERTENSION 08/11/2008  . LIPOMA 02/10/2009  . PREDIABETES 06/15/2009  . 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 Carpal tunnel syndrome of left wrist   . Neuropathy ([facility])   . Interstitial cystitis   . Brain tumor ([place])   . DEPRESSION 08/11/2008  . 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 Osteoporosis   . Hyponatremia 08/28/2011  . Anemia 08/28/2011  . History of chicken pox   . H/O measles   . H/O mumps   . Dermatitis   . OA (osteoarthritis) 09/07/2011  . Melanoma ([place]) 2008    DR. Ronnald Ramp  . Shortness of breath   . Asthma   . History of blood transfusion   . UTI (lower urinary tract infection) 03/03/2012  . Constipation 03/03/2012  . 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 Obesity 09/28/2014  . Medicare annual wellness visit, subsequent  12/27/2014    Follows with Dr Amalia Hailey of urology Follows with Dr Posey Pronto at [facility] for opthamology Follows with Dr [name] of dermatology Follows with [facility] gastroenterology, last scope done in 2011 repeat in 10 years Last [facility] July 2015 should repeat every 1-2 years Last Pap December 2015, repeat in 3 years.     Past Surgical History  Procedure Laterality Date  . Cholecystectomy    . Carpal tunnel release      L wrist, ulnar nerve moved  . Elbow surgery      left  . Tubal ligation  1997  . Lipoma excision  03/28/2009    right leg  . Portacath placement  may 2011  . Craniotomy for tumor  2000  . Tonsillectomy  1958  . Melanoma excision    . [place] cath    . Port-a-cath removal  11/30/2010    [place]  . Total knee raplacement  01-2012    Right Knee  . Total knee arthroplasty  02/05/2012    Procedure: TOTAL KNEE ARTHROPLASTY;  Surgeon: Gearlean Alf, MD;  Location: [facility];  Service: Orthopedics;  Laterality: Right;  . Total knee arthroplasty Left 02/10/2013    Procedure: LEFT TOTAL KNEE ARTHROPLASTY;  Surgeon: Gearlean Alf, MD;  Location: [facility];  Service: Orthopedics;  Laterality: Left;  . Left heart catheterization with coronary angiogram N/A  12/16/2013    Procedure: LEFT HEART CATHETERIZATION WITH CORONARY ANGIOGRAM;  Surgeon: Peter M [name], MD;  Location: [facility] CATH LAB;  Service: Cardiovascular;  Laterality: N/A;  . Breast lumpectomy  04/2009    RIGHT FOR BREAST CANCER-CHEMO/RADIATION X 1 YEAR  . Breast reduction surgery Bilateral 05/04/2014    Procedure: MAMMARY REDUCTION  (BREAST);  Surgeon: Cristine Polio, MD;  Location: [facility];  Service: Plastics;  Laterality: Bilateral;  . Knee arthroscopy Left 10/12/2014    Procedure: LEFT KNEE ARTHROSCOPY ;  Surgeon: Gaynelle Arabian, MD;  Location: [facility];  Service: Orthopedics;  Laterality: Left;  . Synovectomy Left 10/12/2014    Procedure: WITH SYNOVECTOMY;  Surgeon: Gaynelle Arabian, MD;  Location: [facility];  Service: Orthopedics;   Laterality: Left;    Family History  Problem Relation Age of Onset  . Breast cancer Mother   . Lung cancer Mother   . Breast cancer Mother     sarcoma  . Hypertension Mother   . Colon cancer Neg Hx   . Prostate cancer Father   . Congestive Heart Failure Father   . Prostate cancer Father   . Prostate cancer Brother   . Prostate cancer Brother   . Stomach cancer Maternal Aunt   . Uterine cancer Maternal Aunt   . Down syndrome Son   . Heart disease Maternal Grandfather     MI  . Heart attack Father     Social History   Social History  . Marital Status: Married    Spouse Name: N/A  . Number of Children: 3  . Years of Education: N/A   Occupational History  . boutique owner    Social History Main Topics  . Smoking status: Never Smoker   . Smokeless tobacco: Never Used  . Alcohol Use: No  . Drug Use: No  . Sexual Activity: No   Other Topics Concern  . Not on file   Social History Narrative    Outpatient Prescriptions Prior to Visit  Medication Sig Dispense Refill  . alendronate (FOSAMAX) 70 MG tablet Take 1 tablet (70 mg total) by mouth every 7 (seven) days. Take with a full glass of water on an empty stomach. 4 tablet 11  . aspirin EC 81 MG tablet Take 81 mg by mouth daily.    . B Complex-C (SUPER B COMPLEX PO) Take 1 tablet by mouth daily.    . carvedilol (COREG) 12.5 MG tablet Take 1 tablet (12.5 mg total) by mouth 2 (two) times daily with a meal. 180 tablet 3  . Cholecalciferol 2000 UNITS CAPS Take 2,000 Units by mouth daily.    . clonazePAM (KLONOPIN) 1 MG tablet TAKE 1/2 TABLET BY MOUTH AT BEDTIME 30 tablet 0  . CRESTOR 20 MG tablet TAKE 1 TABLET (20 MG TOTAL) BY MOUTH AT BEDTIME. 30 tablet 5  . Ferrous Fumarate 324 (106 FE) MG TABS TAKE 1 TABLET (106 MG OF IRON TOTAL) BY MOUTH DAILY. 30 tablet 4  . folic acid (FOLVITE) 1 MG tablet TAKE 1 TABLET BY MOUTH EVERY DAY 30 tablet 5  . gabapentin (NEURONTIN) 300 MG capsule Take 300 mg by mouth 2 (two) times daily.      . hydrOXYzine (ATARAX/VISTARIL) 25 MG tablet Take 25 mg by mouth at bedtime.    . pentosan polysulfate (ELMIRON) 100 MG capsule Take 200 mg by mouth 2 (two) times daily.    . Probiotic Product (META BIOTIC/BIO-ACTIVE 12 PO) Take 1 capsule by mouth daily.    Marland Kitchen  psyllium (METAMUCIL SMOOTH TEXTURE) 28 % packet Take 1 packet by mouth daily as needed (constipation).    Marland Kitchen telmisartan (MICARDIS) 80 MG tablet TAKE 1 TABLET (80 MG TOTAL) BY MOUTH DAILY. 30 tablet 5  . venlafaxine XR (EFFEXOR-XR) 150 MG 24 hr capsule Take 1 capsule (150 mg total) by mouth at bedtime. 30 capsule 5  . ranitidine (ZANTAC) 150 MG tablet Take 1 tablet (150 mg total) by mouth 2 (two) times daily as needed for heartburn. 60 tablet 5  . HYDROcodone-acetaminophen (NORCO/VICODIN) 5-325 MG tablet Take 1-2 tablets by mouth every 6 (six) hours as needed for moderate pain.   0  . methocarbamol (ROBAXIN) 500 MG tablet Take 1 tablet by mouth 4 (four) times daily as needed for muscle spasms.   1   No facility-administered medications prior to visit.    Allergies  Allergen Reactions  . Dilaudid [Hydromorphone Hcl] Itching    Review of Systems  Constitutional: Positive for malaise/fatigue. Negative for fever and chills.  HENT: Negative for congestion and hearing loss.   Eyes: Negative for discharge.  Respiratory: Negative for cough, sputum production and shortness of breath.   Cardiovascular: Negative for chest pain, palpitations and leg swelling.  Gastrointestinal: Negative for heartburn, nausea, vomiting, abdominal pain, diarrhea, constipation and blood in stool.  Genitourinary: Positive for frequency. Negative for dysuria, urgency and hematuria.  Musculoskeletal: Positive for joint pain. Negative for myalgias, back pain and falls.  Skin: Negative for rash.  Neurological: Negative for dizziness, sensory change, loss of consciousness, weakness and headaches.  Endo/Heme/Allergies: Negative for environmental allergies. Does not  bruise/bleed easily.  Psychiatric/Behavioral: Negative for depression and suicidal ideas. The patient is nervous/anxious. The patient does not have insomnia.        Objective:    Physical Exam  Constitutional: She is oriented to person, place, and time. She appears well-developed and well-nourished. No distress.  HENT:  Head: Normocephalic and atraumatic.  Eyes: Conjunctivae are normal.  Neck: Neck supple. No thyromegaly present.  Cardiovascular: Normal rate, regular rhythm and normal heart sounds.   No murmur heard. Pulmonary/Chest: Effort normal and breath sounds normal. No respiratory distress.  Abdominal: Soft. Bowel sounds are normal. She exhibits no distension and no mass. There is no tenderness.  Musculoskeletal: She exhibits no edema.  Lymphadenopathy:    She has no cervical adenopathy.  Neurological: She is alert and oriented to person, place, and time.  Skin: Skin is warm and dry.  Psychiatric: She has a normal mood and affect. Her behavior is normal.    BP 127/66 mmHg  Pulse 76  Temp(Src) 98.2 F (36.8 C) (Oral)  Ht '5\' 4"'  (1.626 m)  Wt 211 lb 2 oz (95.766 kg)  BMI 36.22 kg/m2  SpO2 98%  LMP 01/30/1994 Wt Readings from Last 3 Encounters:  12/21/14 211 lb 2 oz (95.766 kg)  12/04/14 210 lb 9.6 oz (95.528 kg)  11/24/14 212 lb 4.8 oz (96.299 kg)     Lab Results  Component Value Date   WBC 6.2 12/21/2014   HGB 11.9* 12/21/2014   HCT 36.8 12/21/2014   PLT 235.0 12/21/2014   GLUCOSE 81 12/21/2014   CHOL 200 12/21/2014   TRIG 281.0* 12/21/2014   HDL 39.90 12/21/2014   LDLDIRECT 120.0 12/21/2014   LDLCALC 120* 10/10/2013   ALT 27 12/21/2014   AST 27 12/21/2014   NA 144 12/21/2014   K 4.4 12/21/2014   CL 105 12/21/2014   CREATININE 0.80 12/21/2014   BUN 13 12/21/2014  CO2 32 12/21/2014   TSH 2.15 12/21/2014   INR 1.02 12/09/2013   HGBA1C 5.9 12/21/2014    Lab Results  Component Value Date   TSH 2.15 12/21/2014   Lab Results  Component Value Date    WBC 6.2 12/21/2014   HGB 11.9* 12/21/2014   HCT 36.8 12/21/2014   MCV 88.9 12/21/2014   PLT 235.0 12/21/2014   Lab Results  Component Value Date   NA 144 12/21/2014   K 4.4 12/21/2014   CHLORIDE 107 11/24/2014   CO2 32 12/21/2014   GLUCOSE 81 12/21/2014   BUN 13 12/21/2014   CREATININE 0.80 12/21/2014   BILITOT 0.4 12/21/2014   ALKPHOS 99 12/21/2014   AST 27 12/21/2014   ALT 27 12/21/2014   PROT 6.9 12/21/2014   ALBUMIN 4.1 12/21/2014   CALCIUM 9.8 12/21/2014   ANIONGAP 6 11/24/2014   EGFR 70* 11/24/2014   GFR 75.25 12/21/2014   Lab Results  Component Value Date   CHOL 200 12/21/2014   Lab Results  Component Value Date   HDL 39.90 12/21/2014   Lab Results  Component Value Date   LDLCALC 120* 10/10/2013   Lab Results  Component Value Date   TRIG 281.0* 12/21/2014   Lab Results  Component Value Date   CHOLHDL 5 12/21/2014   Lab Results  Component Value Date   HGBA1C 5.9 12/21/2014       Assessment & Plan:   Problem List Items Addressed This Visit    Essential hypertension    Well controlled, no changes to meds. Encouraged heart healthy diet such as the DASH diet and exercise as tolerated.       Hyperlipidemia, mixed    Encouraged heart healthy diet, increase exercise, avoid trans fats, consider a krill oil cap daily      Relevant Orders   Vitamin D (25 hydroxy) (Completed)   CBC (Completed)   TSH (Completed)   Lipid panel (Completed)   Comprehensive metabolic panel (Completed)   Hemoglobin A1c (Completed)   Hyponatremia   Relevant Orders   Vitamin D (25 hydroxy) (Completed)   CBC (Completed)   TSH (Completed)   Lipid panel (Completed)   Comprehensive metabolic panel (Completed)   Hemoglobin A1c (Completed)   Medicare annual wellness visit, subsequent    Patient denies any difficulties at home. No trouble with ADLs, depression or falls. See EMR for functional status screen and depression screen. No recent changes to vision or hearing. Is  UTD with immunizations. Is UTD with screening. Discussed Advanced Directives. Encouraged heart healthy diet, exercise as tolerated and adequate sleep. See patient's problem list for health risk factors to monitor. See AVS for preventative healthcare recommendation schedule. Flu shot and Prevnar todaygiven Follows with Dr Amalia Hailey of urology Follows with Dr Posey Pronto at [facility] for opthamology Follows with Dr [name] of dermatology Follows with [facility] gastroenterology, last scope done in 2011 repeat in 10 years Last [facility] July 2015 should repeat every 1-2 years Last Pap December 2015, repeat in 3 years.      Obesity    Encouraged DASH diet, decrease po intake and increase exercise as tolerated. Needs 7-8 hours of sleep nightly. Avoid trans fats, eat small, frequent meals every 4-5 hours with lean proteins, complex carbs and healthy fats. 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      Prediabetes     minimize simple carbs. Increase exercise as tolerated.       Relevant Orders   Vitamin D (25 hydroxy) (Completed)   CBC (Completed)   TSH (Completed)   Lipid panel (Completed)   Comprehensive metabolic panel (Completed)   Hemoglobin A1c (Completed)    Other Visit Diagnoses    Encounter for immunization    -  Primary    Screening for viral disease        Relevant Orders    Vitamin D (25 hydroxy) (Completed)    CBC (Completed)    TSH (Completed)    Lipid  panel (Completed)    Comprehensive metabolic panel (Completed)    Hemoglobin A1c (Completed)    Hepatitis C Antibody (Completed)    Hyperglycemia        Relevant Orders    Hemoglobin A1c (Completed)    Need for vaccination with 13-polyvalent pneumococcal conjugate vaccine        Relevant Orders    Pneumococcal conjugate vaccine 13-valent (Completed)       I have discontinued Ms. Neyens's methocarbamol. I am also having her maintain her gabapentin, hydrOXYzine, psyllium, pentosan polysulfate, Cholecalciferol, B Complex-C (SUPER B COMPLEX PO), alendronate, CRESTOR, folic acid, telmisartan, Probiotic Product (META BIOTIC/BIO-ACTIVE 12 PO), aspirin EC, venlafaxine XR, Ferrous Fumarate, clonazePAM, HYDROcodone-acetaminophen, carvedilol, and ranitidine.  Meds ordered this encounter  Medications  . ranitidine (ZANTAC) 150 MG tablet    Sig: Take 1 tablet (150 mg total) by mouth 2 (two) times daily as needed for heartburn.    Dispense:  60 tablet    Refill:  6     Penni Homans, MD

## 2014-12-27 NOTE — Assessment & Plan Note (Signed)
Encouraged DASH diet, decrease po intake and increase exercise as tolerated. Needs 7-8 hours of sleep nightly. Avoid trans fats, eat small, frequent meals every 4-5 hours with lean proteins, complex carbs and healthy fats. Minimize simple carbs 

## 2014-12-27 NOTE — Assessment & Plan Note (Signed)
Well controlled, no changes to meds. Encouraged heart healthy diet such as the DASH diet and exercise as tolerated.  °

## 2014-12-27 NOTE — Assessment & Plan Note (Signed)
Has noted an improvement in her function since intervention by her orthopaedist, Dr Maureen Ralphs.

## 2014-12-27 NOTE — Assessment & Plan Note (Signed)
Encouraged heart healthy diet, increase exercise, avoid trans fats, consider a krill oil cap daily 

## 2014-12-27 NOTE — Assessment & Plan Note (Signed)
Patient denies any difficulties at home. No trouble with ADLs, depression or falls. See EMR for functional status screen and depression screen. No recent changes to vision or hearing. Is UTD with immunizations. Is UTD with screening. Discussed Advanced Directives. Encouraged heart healthy diet, exercise as tolerated and adequate sleep. See patient's problem list for health risk factors to monitor. See AVS for preventative healthcare recommendation schedule. Flu shot and Prevnar todaygiven Follows with Dr Amalia Hailey of urology Follows with Dr Posey Pronto at [facility] for opthamology Follows with Dr [name] of dermatology Follows with [facility], last scope done in 2011 repeat in 10 years Last [facility] July 2015 should repeat every 1-2 years Last Pap December 2015, repeat in 3 years.

## 2014-12-30 ENCOUNTER — Telehealth: Payer: Self-pay | Admitting: Family Medicine

## 2014-12-30 DIAGNOSIS — J020 Streptococcal pharyngitis: Secondary | ICD-10-CM | POA: Diagnosis not present

## 2014-12-30 DIAGNOSIS — J029 Acute pharyngitis, unspecified: Secondary | ICD-10-CM | POA: Diagnosis not present

## 2014-12-30 DIAGNOSIS — J0300 Acute streptococcal tonsillitis, unspecified: Secondary | ICD-10-CM | POA: Diagnosis not present

## 2014-12-30 NOTE — Telephone Encounter (Signed)
Called patient . No answer,left message for callback regarding having patient placed on todays schedule.

## 2014-12-30 NOTE — Telephone Encounter (Signed)
[facility] - Client [facility]  Patient Name: Shannon [name]  DOB: 1945/04/19    Initial Comment Caller states her mom has a fever of 101, and sore throat. She is having trouble swallowing.   Nurse Assessment      Guidelines    Guideline Title Affirmed Question Affirmed Notes       Final Disposition User   FINAL ATTEMPT MADE - message left [place], Therapist, sports, Tillie Rung

## 2015-01-04 ENCOUNTER — Encounter: Payer: Self-pay | Admitting: Physician Assistant

## 2015-01-04 ENCOUNTER — Ambulatory Visit (INDEPENDENT_AMBULATORY_CARE_PROVIDER_SITE_OTHER): Payer: Medicare Other | Admitting: Physician Assistant

## 2015-01-04 VITALS — BP 128/80 | HR 91 | Temp 99.0°F | Ht 64.0 in | Wt 208.8 lb

## 2015-01-04 DIAGNOSIS — J020 Streptococcal pharyngitis: Secondary | ICD-10-CM

## 2015-01-04 DIAGNOSIS — B370 Candidal stomatitis: Secondary | ICD-10-CM | POA: Diagnosis not present

## 2015-01-04 MED ORDER — AZITHROMYCIN 250 MG PO TABS: ORAL_TABLET | ORAL | Status: DC

## 2015-01-04 MED ORDER — MAGIC MOUTHWASH W/LIDOCAINE: ORAL | Status: DC

## 2015-01-04 NOTE — Patient Instructions (Addendum)
Please take antibiotic as directed and use mouthwash as directed. Stay well hydrated. Get some flonase and use as directed. Tylenol for fever and sore throat. Follow-up on Friday if symptoms are not resolving.

## 2015-01-04 NOTE — Progress Notes (Signed)
Patient presents to clinic today c/o recurrence of fever and sore throat x 3 days after being treated for strep throat with 10-day of amoxicillin. Patient endorses sore throat and ear pain bilaterally without cough. Denies shortness of breath, chills or aches.    Past Medical History  Diagnosis Date  . NICM (nonischemic cardiomyopathy) ([place]) 03/08/2010    likely 2/2 chemotx - a. Echo 2012: EF 45-50%;  b. Lex MV 2/12:  low risk, apical defect (small area of ischemia vs shifting breast atten);  c.  Echo 7/12: Normal wall thickness, EF 60-65%, normal wall motion, grade 1 diastolic dysfunction, mild LAE, PASP 32;   d. Lex MV 11/13:  EF 76%, no ischemia  . GERD 08/11/2008  . HYPERLIPIDEMIA 08/11/2008  . HYPERTENSION 08/11/2008  . LIPOMA 02/10/2009  . PREDIABETES 06/15/2009  . 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 Carpal tunnel syndrome of left wrist   . Neuropathy ([facility])   . Interstitial cystitis   . Brain tumor ([place])   . DEPRESSION 08/11/2008  . 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 Osteoporosis   . Hyponatremia 08/28/2011  . Anemia 08/28/2011  . History of chicken pox   . H/O measles   . H/O mumps   . Dermatitis   . OA (osteoarthritis) 09/07/2011  . Melanoma ([place]) 2008    DR. Ronnald Ramp  . Shortness of breath   . Asthma   . History of blood transfusion   . UTI (lower urinary tract infection) 03/03/2012  . Constipation 03/03/2012  . 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 Obesity 09/28/2014  . Medicare annual wellness visit, subsequent 12/27/2014    Follows with Dr Amalia Hailey of urology Follows with Dr Posey Pronto at [facility] for opthamology Follows with Dr [name] of dermatology Follows with [facility]ology, last scope done in 2011 repeat in 10 years Last [facility] July 2015 should repeat every 1-2 years Last Pap December 2015, repeat in 3 years.     Current Outpatient Prescriptions on File Prior to Visit  Medication Sig  Dispense Refill  . alendronate (FOSAMAX) 70 MG tablet Take 1 tablet (70 mg total) by mouth every 7 (seven) days. Take with a full glass of water on an empty stomach. 4 tablet 11  . aspirin EC 81 MG tablet Take 81 mg by mouth daily.    . B Complex-C (SUPER B COMPLEX PO) Take 1 tablet by mouth daily.    . carvedilol (COREG) 12.5 MG tablet Take 1 tablet (12.5 mg total) by mouth 2 (two) times daily with a meal. 180 tablet 3  . Cholecalciferol 2000 UNITS CAPS Take 2,000 Units by mouth daily.    . clonazePAM (KLONOPIN) 1 MG tablet TAKE 1/2 TABLET BY MOUTH AT BEDTIME 30 tablet 0  . CRESTOR 20 MG tablet TAKE 1 TABLET (20 MG TOTAL) BY MOUTH AT BEDTIME. 30 tablet 5  . Ferrous Fumarate 324 (106 FE) MG TABS TAKE 1 TABLET (106 MG OF IRON TOTAL) BY MOUTH DAILY. 30 tablet 4  . folic acid (FOLVITE) 1 MG tablet TAKE 1 TABLET BY MOUTH EVERY DAY 30 tablet 5  . gabapentin (NEURONTIN) 300 MG capsule Take 300 mg by mouth 2 (two) times daily.     Marland Kitchen HYDROcodone-acetaminophen (NORCO/VICODIN) 5-325 MG tablet Take 1-2 tablets by mouth every 6 (six) hours as needed for moderate pain.   0  . hydrOXYzine (ATARAX/VISTARIL)  25 MG tablet Take 25 mg by mouth at bedtime.    . pentosan polysulfate (ELMIRON) 100 MG capsule Take 200 mg by mouth 2 (two) times daily.    . Probiotic Product (META BIOTIC/BIO-ACTIVE 12 PO) Take 1 capsule by mouth daily.    . psyllium (METAMUCIL SMOOTH TEXTURE) 28 % packet Take 1 packet by mouth daily as needed (constipation).    . ranitidine (ZANTAC) 150 MG tablet Take 1 tablet (150 mg total) by mouth 2 (two) times daily as needed for heartburn. 60 tablet 6  . telmisartan (MICARDIS) 80 MG tablet TAKE 1 TABLET (80 MG TOTAL) BY MOUTH DAILY. 30 tablet 5  . venlafaxine XR (EFFEXOR-XR) 150 MG 24 hr capsule Take 1 capsule (150 mg total) by mouth at bedtime. 30 capsule 5   No current facility-administered medications on file prior to visit.    Allergies  Allergen Reactions  . Dilaudid [Hydromorphone Hcl]  Itching    Family History  Problem Relation Age of Onset  . Breast cancer Mother   . Lung cancer Mother   . Breast cancer Mother     sarcoma  . Hypertension Mother   . Colon cancer Neg Hx   . Prostate cancer Father   . Congestive Heart Failure Father   . Prostate cancer Father   . Prostate cancer Brother   . Prostate cancer Brother   . Stomach cancer Maternal Aunt   . Uterine cancer Maternal Aunt   . Down syndrome Son   . Heart disease Maternal Grandfather     MI  . Heart attack Father     Social History   Social History  . Marital Status: Married    Spouse Name: N/A  . Number of Children: 3  . Years of Education: N/A   Occupational History  . boutique owner    Social History Main Topics  . Smoking status: Never Smoker   . Smokeless tobacco: Never Used  . Alcohol Use: No  . Drug Use: No  . Sexual Activity: No   Other Topics Concern  . None   Social History Narrative   Review of Systems - See HPI.  All other ROS are negative.  BP 128/80 mmHg  Pulse 91  Temp(Src) 99 F (37.2 C) (Oral)  Ht '5\' 4"'  (1.626 m)  Wt 208 lb 12.8 oz (94.711 kg)  BMI 35.82 kg/m2  SpO2 94%  LMP 01/30/1994  Physical Exam  Constitutional: She is oriented to person, place, and time and well-developed, well-nourished, and in no distress.  HENT:  Head: Normocephalic and atraumatic.  Right Ear: Tympanic membrane normal.  Left Ear: Tympanic membrane normal.  Nose: Nose normal.  Mouth/Throat: Uvula is midline and mucous membranes are normal. Oropharyngeal exudate and posterior oropharyngeal erythema present. No tonsillar abscesses.    Eyes: Conjunctivae are normal. Pupils are equal, round, and reactive to light.  Neck: Neck supple.  Cardiovascular: Normal rate, regular rhythm, normal heart sounds and intact distal pulses.   Pulmonary/Chest: Effort normal and breath sounds normal. No respiratory distress. She has no wheezes. She has no rales. She exhibits no tenderness.    Lymphadenopathy:    She has no cervical adenopathy.  Neurological: She is alert and oriented to person, place, and time.  Skin: Skin is warm and dry. No rash noted.  Psychiatric: Affect normal.  Vitals reviewed.   Recent Results (from the past 2160 hour(s))  CBC with Differential     Status: Abnormal   Collection Time: 11/24/14 10:39 AM  Result Value Ref Range   WBC 5.7 3.9 - 10.3 10e3/uL   NEUT# 3.4 1.5 - 6.5 10e3/uL   HGB 12.0 11.6 - 15.9 g/dL   HCT 36.2 34.8 - 46.6 %   Platelets 237 145 - 400 10e3/uL   MCV 88.8 79.5 - 101.0 fL   MCH 29.3 25.1 - 34.0 pg   MCHC 33.0 31.5 - 36.0 g/dL   RBC 4.08 3.70 - 5.45 10e6/uL   RDW 13.8 11.2 - 14.5 %   lymph# 1.3 0.9 - 3.3 10e3/uL   MONO# 0.5 0.1 - 0.9 10e3/uL   Eosinophils Absolute 0.4 0.0 - 0.5 10e3/uL   Basophils Absolute 0.1 0.0 - 0.1 10e3/uL   NEUT% 59.8 38.4 - 76.8 %   LYMPH% 23.7 14.0 - 49.7 %   MONO% 8.3 0.0 - 14.0 %   EOS% 7.2 (H) 0.0 - 7.0 %   BASO% 1.0 0.0 - 2.0 %  Comprehensive metabolic panel     Status: Abnormal   Collection Time: 11/24/14 10:39 AM  Result Value Ref Range   Sodium 143 136 - 145 mEq/L   Potassium 4.6 3.5 - 5.1 mEq/L   Chloride 107 98 - 109 mEq/L   CO2 30 (H) 22 - 29 mEq/L   Glucose 105 70 - 140 mg/dl    Comment: Glucose reference range is for nonfasting patients. Fasting glucose reference range is 70- 100.   BUN 13.8 7.0 - 26.0 mg/dL   Creatinine 0.8 0.6 - 1.1 mg/dL   Total Bilirubin 0.32 0.20 - 1.20 mg/dL   Alkaline Phosphatase 106 40 - 150 U/L   AST 28 5 - 34 U/L   ALT 31 0 - 55 U/L   Total Protein 6.7 6.4 - 8.3 g/dL   Albumin 3.9 3.5 - 5.0 g/dL   Calcium 9.9 8.4 - 10.4 mg/dL   Anion Gap 6 3 - 11 mEq/L   EGFR 70 (L) >90 ml/min/1.73 m2    Comment: eGFR is calculated using the CKD-EPI Creatinine Equation (2009)  Vitamin D (25 hydroxy)     Status: None   Collection Time: 12/21/14  2:34 PM  Result Value Ref Range   VITD 35.94 30.00 - 100.00 ng/mL  CBC     Status: Abnormal   Collection Time:  12/21/14  2:34 PM  Result Value Ref Range   WBC 6.2 4.0 - 10.5 K/uL   RBC 4.14 3.87 - 5.11 Mil/uL   Platelets 235.0 150.0 - 400.0 K/uL   Hemoglobin 11.9 (L) 12.0 - 15.0 g/dL   HCT 36.8 36.0 - 46.0 %   MCV 88.9 78.0 - 100.0 fl   MCHC 32.5 30.0 - 36.0 g/dL   RDW 14.0 11.5 - 15.5 %  TSH     Status: None   Collection Time: 12/21/14  2:34 PM  Result Value Ref Range   TSH 2.15 0.35 - 4.50 uIU/mL  Lipid panel     Status: Abnormal   Collection Time: 12/21/14  2:34 PM  Result Value Ref Range   Cholesterol 200 0 - 200 mg/dL    Comment: ATP III Classification       Desirable:  < 200 mg/dL               Borderline High:  200 - 239 mg/dL          High:  > = 240 mg/dL   Triglycerides 281.0 (H) 0.0 - 149.0 mg/dL    Comment: Normal:  <150 mg/dLBorderline High:  150 - 199 mg/dL  HDL 39.90 >39.00 mg/dL   VLDL 56.2 (H) 0.0 - 40.0 mg/dL   Total CHOL/HDL Ratio 5     Comment:                Men          Women1/2 Average Risk     3.4          3.3Average Risk          5.0          4.42X Average Risk          9.6          7.13X Average Risk          15.0          11.0                       NonHDL 160.43     Comment: NOTE:  Non-HDL goal should be 30 mg/dL higher than patient's LDL goal (i.e. LDL goal of < 70 mg/dL, would have non-HDL goal of < 100 mg/dL)  Comprehensive metabolic panel     Status: None   Collection Time: 12/21/14  2:34 PM  Result Value Ref Range   Sodium 144 135 - 145 mEq/L   Potassium 4.4 3.5 - 5.1 mEq/L   Chloride 105 96 - 112 mEq/L   CO2 32 19 - 32 mEq/L   Glucose, Bld 81 70 - 99 mg/dL   BUN 13 6 - 23 mg/dL   Creatinine, Ser 0.80 0.40 - 1.20 mg/dL   Total Bilirubin 0.4 0.2 - 1.2 mg/dL   Alkaline Phosphatase 99 39 - 117 U/L   AST 27 0 - 37 U/L   ALT 27 0 - 35 U/L   Total Protein 6.9 6.0 - 8.3 g/dL   Albumin 4.1 3.5 - 5.2 g/dL   Calcium 9.8 8.4 - 10.5 mg/dL   GFR 75.25 >60.00 mL/min  Hemoglobin A1c     Status: None   Collection Time: 12/21/14  2:34 PM  Result Value Ref Range    Hgb A1c MFr Bld 5.9 4.6 - 6.5 %    Comment: Glycemic Control Guidelines for People with Diabetes:Non Diabetic:  <6%Goal of Therapy: <7%Additional Action Suggested:  >8%   Hepatitis C Antibody     Status: None   Collection Time: 12/21/14  2:34 PM  Result Value Ref Range   HCV Ab NEGATIVE NEGATIVE  LDL cholesterol, direct     Status: None   Collection Time: 12/21/14  2:34 PM  Result Value Ref Range   Direct LDL 120.0 mg/dL    Comment: Optimal:  <100 mg/dLNear or Above Optimal:  100-129 mg/dLBorderline High:  130-159 mg/dLHigh:  160-189 mg/dLVery High:  >190 mg/dL    Assessment/Plan: Strep pharyngitis Recent treatment with Amoxicillin taken as directed. Rapid strep negative but exam consistent with continued infection. Also with thrush. Rx Azithromycin. Rx magic Mouthwash solution. Tylenol for fever. Supportive measures reviewed. Follow-up 1 week.

## 2015-01-04 NOTE — Progress Notes (Signed)
Pre visit review using our clinic review tool, if applicable. No additional management support is needed unless otherwise documented below in the visit note. 

## 2015-01-05 DIAGNOSIS — J020 Streptococcal pharyngitis: Secondary | ICD-10-CM | POA: Insufficient documentation

## 2015-01-05 DIAGNOSIS — B370 Candidal stomatitis: Secondary | ICD-10-CM | POA: Insufficient documentation

## 2015-01-05 NOTE — Assessment & Plan Note (Signed)
Recent treatment with Amoxicillin taken as directed. Rapid strep negative but exam consistent with continued infection. Also with thrush. Rx Azithromycin. Rx magic Mouthwash solution. Tylenol for fever. Supportive measures reviewed. Follow-up 1 week.

## 2015-01-10 IMAGING — CR DG CHEST 1V PORT
1 series · 1 of 1 positions shown · non-contrast
Comparison: 09/16/2011 and a chest CT dated 10/03/2011

CLINICAL DATA: Loss of consciousness.

PORTABLE CHEST - 1 VIEW

[x chest ap]
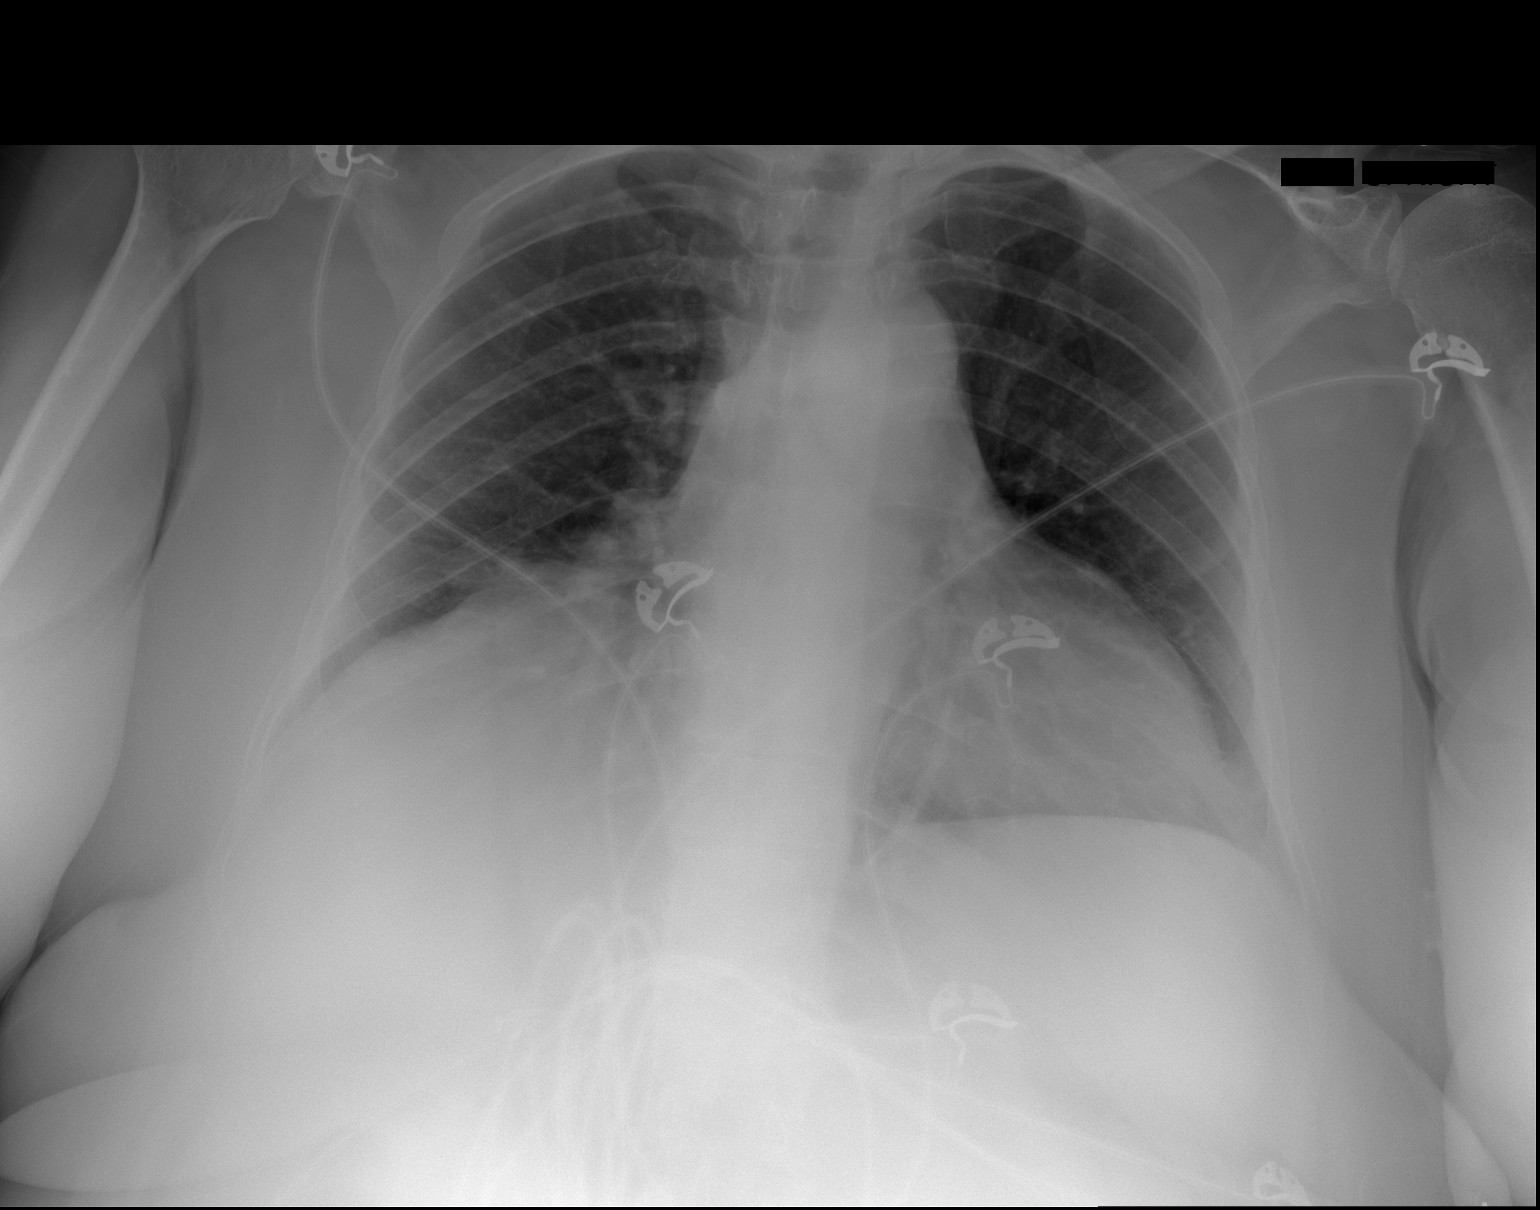

[1 of 1 positions shown; findings below may reference images not displayed]

FINDINGS: Heart size and vascularity are normal and the lungs are
clear.  Chronic elevation of the right hemidiaphragm, unchanged
since the prior chest CT.
IMPRESSION: No acute disease.

## 2015-01-13 ENCOUNTER — Other Ambulatory Visit: Payer: Self-pay | Admitting: Family Medicine

## 2015-01-14 NOTE — Telephone Encounter (Signed)
Faxed hardcopy for Clonazepam to [facility] [place] 

## 2015-01-14 NOTE — Telephone Encounter (Signed)
Requesting: clonazepam Contract None UDS  None Last OV  12/21/2014 Last Refill   330 with 0 11/26/2014  Please Advise

## 2015-01-27 ENCOUNTER — Encounter: Payer: Self-pay | Admitting: *Deleted

## 2015-02-11 ENCOUNTER — Other Ambulatory Visit: Payer: Self-pay | Admitting: Family Medicine

## 2015-03-09 DIAGNOSIS — D1801 Hemangioma of skin and subcutaneous tissue: Secondary | ICD-10-CM | POA: Diagnosis not present

## 2015-03-09 DIAGNOSIS — D225 Melanocytic nevi of trunk: Secondary | ICD-10-CM | POA: Diagnosis not present

## 2015-03-09 DIAGNOSIS — L821 Other seborrheic keratosis: Secondary | ICD-10-CM | POA: Diagnosis not present

## 2015-03-09 DIAGNOSIS — L570 Actinic keratosis: Secondary | ICD-10-CM | POA: Diagnosis not present

## 2015-03-09 DIAGNOSIS — Z85820 Personal history of malignant melanoma of skin: Secondary | ICD-10-CM | POA: Diagnosis not present

## 2015-03-09 DIAGNOSIS — L565 Disseminated superficial actinic porokeratosis (DSAP): Secondary | ICD-10-CM | POA: Diagnosis not present

## 2015-03-13 ENCOUNTER — Other Ambulatory Visit: Payer: Self-pay | Admitting: Family Medicine

## 2015-03-15 NOTE — Telephone Encounter (Signed)
Faxed hardcopy for [facility]azep[facility] to [facility] in [facility].

## 2015-04-12 ENCOUNTER — Other Ambulatory Visit: Payer: Self-pay | Admitting: Family Medicine

## 2015-04-15 DIAGNOSIS — R3915 Urgency of urination: Secondary | ICD-10-CM | POA: Diagnosis not present

## 2015-04-15 DIAGNOSIS — R399 Unspecified symptoms and signs involving the genitourinary system: Secondary | ICD-10-CM | POA: Diagnosis not present

## 2015-04-15 DIAGNOSIS — Z09 Encounter for follow-up examination after completed treatment for conditions other than malignant neoplasm: Secondary | ICD-10-CM | POA: Diagnosis not present

## 2015-04-15 DIAGNOSIS — R1030 Lower abdominal pain, unspecified: Secondary | ICD-10-CM | POA: Diagnosis not present

## 2015-04-15 DIAGNOSIS — R102 Pelvic and perineal pain: Secondary | ICD-10-CM | POA: Diagnosis not present

## 2015-04-15 DIAGNOSIS — R350 Frequency of micturition: Secondary | ICD-10-CM | POA: Diagnosis not present

## 2015-04-15 DIAGNOSIS — N3010 Interstitial cystitis (chronic) without hematuria: Secondary | ICD-10-CM | POA: Diagnosis not present

## 2015-04-29 ENCOUNTER — Encounter: Payer: Self-pay | Admitting: Women's Health

## 2015-04-29 ENCOUNTER — Ambulatory Visit (INDEPENDENT_AMBULATORY_CARE_PROVIDER_SITE_OTHER): Payer: Medicare Other | Admitting: Women's Health

## 2015-04-29 VITALS — BP 132/80 | Ht 64.0 in | Wt 206.0 lb

## 2015-04-29 DIAGNOSIS — M810 Age-related osteoporosis without current pathological fracture: Secondary | ICD-10-CM | POA: Diagnosis not present

## 2015-04-29 DIAGNOSIS — Z01419 Encounter for gynecological examination (general) (routine) without abnormal findings: Secondary | ICD-10-CM | POA: Diagnosis not present

## 2015-04-29 MED ORDER — ALENDRONATE SODIUM 70 MG PO TABS: 70.0000 mg | ORAL_TABLET | ORAL | Status: DC

## 2015-04-29 NOTE — Progress Notes (Signed)
Shannon [name] [birth year] [id number]    History:    Presents for breast and pelvic exam. Postmenopausal on no HRT with no bleeding. 2016 T score -2.5 femoral neck/stable has been on Atelvia for 3 years and Fosamax for one year. 2011 with right breast cancer. Normal Pap history. Hypertension, hypercholesterolemia and IC managed by primary care. 2008 melanoma has had annual skin checks. 12/2014 vitamin D 36. 2011 colonoscopy normal. Current on vaccines.  Past medical history, past surgical history, family history and social history were all reviewed and documented in the EPIC chart. Husband had massive MI in December 2016. [name] who has [place] struggling with memory age 25.  ROS:  A ROS was performed and pertinent positives and negatives are included.  Exam:  Filed Vitals:   04/29/15 1126  BP: 132/80    General appearance:  Normal Thyroid:  Symmetrical, normal in size, without palpable masses or nodularity. Respiratory  Auscultation:  Clear without wheezing or rhonchi Cardiovascular  Auscultation:  Regular rate, without rubs, murmurs or gallops  Edema/varicosities:  Not grossly evident Abdominal  Soft,nontender, without masses, guarding or rebound.  Liver/spleen:  No organomegaly noted  Hernia:  None appreciated  Skin  Inspection:  Grossly normal   Breasts: Examined lying and sitting.Breast reduction     Right: Without masses, retractions, discharge or axillary adenopathy.     Left: Without masses, retractions, discharge or axillary adenopathy. Gentitourinary   Inguinal/mons:  Normal without inguinal adenopathy  External genitalia:  Normal  BUS/Urethra/Skene's glands:  Normal  Vagina:  Normal  Cervix:  Normal  Uterus:   normal in size, shape and contour.  Midline and mobile  Adnexa/parametria:     Rt: Without masses or tenderness.   Lt: Without masses or tenderness.  Anus and perineum: Normal  Digital rectal exam: Normal sphincter tone without palpated masses or  tenderness  Assessment/Plan:  70 y.o. M WF G3 P3 for breast and pelvic exam.  Postmenopausal/no HRT/no bleeding Osteoporosis Atelvia for 3 years 2013 - 2016 Fosamax current.  2011 right  breast cancer (lumpectomy/chemotherapy/radiation) Hypertension/hypercholesterolemia/IC-primary care manages labs and meds 2008 melanoma-annual skin checks Obesity  Plan: SBE's, continue annual screening mammogram. Safety, fall prevention and importance of weightbearing exercise reviewed. Fosamax 70 mg by mouth weekly prescription, proper use given and reviewed tolerating well will continue 1 more year 5 years will be up at next annual.      Huel Cote [facility], 1:16 PM 04/29/2015

## 2015-04-29 NOTE — Patient Instructions (Signed)
Menopause is a normal process in which your reproductive ability comes to an end. This process happens gradually over a span of months to years, usually between the ages of 48 and 55. Menopause is complete when you have missed 12 consecutive menstrual periods. It is important to talk with your health care provider about some of the most common conditions that affect postmenopausal women, such as heart disease, cancer, and bone loss (osteoporosis). Adopting a healthy lifestyle and getting preventive care can help to promote your health and wellness. Those actions can also lower your chances of developing some of these common conditions. WHAT SHOULD I KNOW ABOUT MENOPAUSE? During menopause, you may experience a number of symptoms, such as:  Moderate-to-severe hot flashes.  Night sweats.  Decrease in sex drive.  Mood swings.  Headaches.  Tiredness.  Irritability.  Memory problems.  Insomnia. Choosing to treat or not to treat menopausal changes is an individual decision that you make with your health care provider. WHAT SHOULD I KNOW ABOUT HORMONE REPLACEMENT THERAPY AND SUPPLEMENTS? Hormone therapy products are effective for treating symptoms that are associated with menopause, such as hot flashes and night sweats. Hormone replacement carries certain risks, especially as you become older. If you are thinking about using estrogen or estrogen with progestin treatments, discuss the benefits and risks with your health care provider. WHAT SHOULD I KNOW ABOUT HEART DISEASE AND STROKE? Heart disease, heart attack, and stroke become more likely as you age. This may be due, in part, to the hormonal changes that your body experiences during menopause. These can affect how your body processes dietary fats, triglycerides, and cholesterol. Heart attack and stroke are both medical emergencies. There are many things that you can do to help prevent heart disease and stroke:  Have your blood pressure  checked at least every 1-2 years. High blood pressure causes heart disease and increases the risk of stroke.  If you are 55-79 years old, ask your health care provider if you should take aspirin to prevent a heart attack or a stroke.  Do not use any tobacco products, including cigarettes, chewing tobacco, or electronic cigarettes. If you need help quitting, ask your health care provider.  It is important to eat a healthy diet and maintain a healthy weight.  Be sure to include plenty of vegetables, fruits, low-fat dairy products, and lean protein.  Avoid eating foods that are high in solid fats, added sugars, or salt (sodium).  Get regular exercise. This is one of the most important things that you can do for your health.  Try to exercise for at least 150 minutes each week. The type of exercise that you do should increase your heart rate and make you sweat. This is known as moderate-intensity exercise.  Try to do strengthening exercises at least twice each week. Do these in addition to the moderate-intensity exercise.  Know your numbers.Ask your health care provider to check your cholesterol and your blood glucose. Continue to have your blood tested as directed by your health care provider. WHAT SHOULD I KNOW ABOUT CANCER SCREENING? There are several types of cancer. Take the following steps to reduce your risk and to catch any cancer development as early as possible. Breast Cancer  Practice breast self-awareness.  This means understanding how your breasts normally appear and feel.  It also means doing regular breast self-exams. Let your health care provider know about any changes, no matter how small.  If you are 40 or older, have a clinician do a   breast exam (clinical breast exam or CBE) every year. Depending on your age, family history, and medical history, it may be recommended that you also have a yearly breast X-ray (mammogram).  If you have a family history of breast cancer,  talk with your health care provider about genetic screening.  If you are at high risk for breast cancer, talk with your health care provider about having an MRI and a mammogram every year.  Breast cancer (BRCA) gene test is recommended for women who have family members with BRCA-related cancers. Results of the assessment will determine the need for genetic counseling and BRCA1 and for BRCA2 testing. BRCA-related cancers include these types:  Breast. This occurs in males or females.  Ovarian.  Tubal. This may also be called fallopian tube cancer.  Cancer of the abdominal or pelvic lining (peritoneal cancer).  Prostate.  Pancreatic. Cervical, Uterine, and Ovarian Cancer Your health care provider may recommend that you be screened regularly for cancer of the pelvic organs. These include your ovaries, uterus, and vagina. This screening involves a pelvic exam, which includes checking for microscopic changes to the surface of your cervix (Pap test).  For women ages 21-65, health care providers may recommend a pelvic exam and a Pap test every three years. For women ages 77-65, they may recommend the Pap test and pelvic exam, combined with testing for human papilloma virus (HPV), every five years. Some types of HPV increase your risk of cervical cancer. Testing for HPV may also be done on women of any age who have unclear Pap test results.  Other health care providers may not recommend any screening for nonpregnant women who are considered low risk for pelvic cancer and have no symptoms. Ask your health care provider if a screening pelvic exam is right for you.  If you have had past treatment for cervical cancer or a condition that could lead to cancer, you need Pap tests and screening for cancer for at least 20 years after your treatment. If Pap tests have been discontinued for you, your risk factors (such as having a new sexual partner) need to be reassessed to determine if you should start having  screenings again. Some women have medical problems that increase the chance of getting cervical cancer. In these cases, your health care provider may recommend that you have screening and Pap tests more often.  If you have a family history of uterine cancer or ovarian cancer, talk with your health care provider about genetic screening.  If you have vaginal bleeding after reaching menopause, tell your health care provider.  There are currently no reliable tests available to screen for ovarian cancer. Lung Cancer Lung cancer screening is recommended for adults 3-70 years old who are at high risk for lung cancer because of a history of smoking. A yearly low-dose CT scan of the lungs is recommended if you:  Currently smoke.  Have a history of at least 30 pack-years of smoking and you currently smoke or have quit within the past 15 years. A pack-year is smoking an average of one pack of cigarettes per day for one year. Yearly screening should:  Continue until it has been 15 years since you quit.  Stop if you develop a health problem that would prevent you from having lung cancer treatment. Colorectal Cancer  This type of cancer can be detected and can often be prevented.  Routine colorectal cancer screening usually begins at age 38 and continues through age 12.  If you have  risk factors for colon cancer, your health care provider may recommend that you be screened at an earlier age.  If you have a family history of colorectal cancer, talk with your health care provider about genetic screening.  Your health care provider may also recommend using home test kits to check for hidden blood in your stool.  A small camera at the end of a tube can be used to examine your colon directly (sigmoidoscopy or colonoscopy). This is done to check for the earliest forms of colorectal cancer.  Direct examination of the colon should be repeated every 5-10 years until age 67. However, if early forms of  precancerous polyps or small growths are found or if you have a family history or genetic risk for colorectal cancer, you may need to be screened more often. Skin Cancer  Check your skin from head to toe regularly.  Monitor any moles. Be sure to tell your health care provider:  About any new moles or changes in moles, especially if there is a change in a mole's shape or color.  If you have a mole that is larger than the size of a pencil eraser.  If any of your family members has a history of skin cancer, especially at a young age, talk with your health care provider about genetic screening.  Always use sunscreen. Apply sunscreen liberally and repeatedly throughout the day.  Whenever you are outside, protect yourself by wearing long sleeves, pants, a wide-brimmed hat, and sunglasses. WHAT SHOULD I KNOW ABOUT OSTEOPOROSIS? Osteoporosis is a condition in which bone destruction happens more quickly than new bone creation. After menopause, you may be at an increased risk for osteoporosis. To help prevent osteoporosis or the bone fractures that can happen because of osteoporosis, the following is recommended:  If you are 39-61 years old, get at least 1,000 mg of calcium and at least 600 mg of vitamin D per day.  If you are older than age 16 but younger than age 7, get at least 1,200 mg of calcium and at least 600 mg of vitamin D per day.  If you are older than age 47, get at least 1,200 mg of calcium and at least 800 mg of vitamin D per day. Smoking and excessive alcohol intake increase the risk of osteoporosis. Eat foods that are rich in calcium and vitamin D, and do weight-bearing exercises several times each week as directed by your health care provider. WHAT SHOULD I KNOW ABOUT HOW MENOPAUSE AFFECTS [place]? Depression may occur at any age, but it is more common as you become older. Common symptoms of depression include:  Low or sad mood.  Changes in sleep patterns.  Changes  in appetite or eating patterns.  Feeling an overall lack of motivation or enjoyment of activities that you previously enjoyed.  Frequent crying spells. Talk with your health care provider if you think that you are experiencing depression. WHAT SHOULD I KNOW ABOUT IMMUNIZATIONS? It is important that you get and maintain your immunizations. These include:  Tetanus, diphtheria, and pertussis (Tdap) booster vaccine.  Influenza every year before the flu season begins.  Pneumonia vaccine.  Shingles vaccine. Your health care provider may also recommend other immunizations.   This information is not intended to replace advice given to you by your health care provider. Make sure you discuss any questions you have with your health care provider.   Document Released: 03/10/2005 Document Revised: 02/06/2014 Document Reviewed: 09/18/2013 Elsevier Interactive Patient Education 2016 [employer]  Osteoporosis Osteoporosis is the thinning and loss of density in the bones. Osteoporosis makes the bones more brittle, fragile, and likely to break (fracture). Over time, osteoporosis can cause the bones to become so weak that they fracture after a simple fall. The bones most likely to fracture are the bones in the hip, wrist, and spine. CAUSES  The exact cause is not known. RISK FACTORS Anyone can develop osteoporosis. You may be at greater risk if you have a family history of the condition or have poor nutrition. You may also have a higher risk if you are:   Female.   50 years old or older.  A smoker.  Not physically active.   White or Asian.  Slender. SIGNS AND SYMPTOMS  A fracture might be the first sign of the disease, especially if it results from a fall or injury that would not usually cause a bone to break. Other signs and symptoms include:   Low back and neck pain.  Stooped posture.  Height loss. DIAGNOSIS  To make a diagnosis, your health care provider may:  Take a medical  history.  Perform a physical exam.  Order tests, such as:  A bone mineral density test.  A dual-energy X-ray absorptiometry test. TREATMENT  The goal of osteoporosis treatment is to strengthen your bones to reduce your risk of a fracture. Treatment may involve:  Making lifestyle changes, such as:  Eating a diet rich in calcium.  Doing weight-bearing and muscle-strengthening exercises.  Stopping tobacco use.  Limiting alcohol intake.  Taking medicine to slow the process of bone loss or to increase bone density.  Monitoring your levels of calcium and vitamin D. HOME CARE INSTRUCTIONS  Include calcium and vitamin D in your diet. Calcium is important for bone health, and vitamin D helps the body absorb calcium.  Perform weight-bearing and muscle-strengthening exercises as directed by your health care provider.  Do not use any tobacco products, including cigarettes, chewing tobacco, and electronic cigarettes. If you need help quitting, ask your health care provider.  Limit your alcohol intake.  Take medicines only as directed by your health care provider.  Keep all follow-up visits as directed by your health care provider. This is important.  Take precautions at home to lower your risk of falling, such as:  Keeping rooms well lit and clutter free.  Installing safety rails on stairs.  Using rubber mats in the bathroom and other areas that are often wet or slippery. SEEK IMMEDIATE MEDICAL CARE IF:  You fall or injure yourself.    This information is not intended to replace advice given to you by your health care provider. Make sure you discuss any questions you have with your health care provider.   Document Released: 10/26/2004 Document Revised: 02/06/2014 Document Reviewed: 06/26/2013 Elsevier Interactive Patient Education 2016 [employer]  

## 2015-05-05 DIAGNOSIS — M65342 Trigger finger, left ring finger: Secondary | ICD-10-CM | POA: Diagnosis not present

## 2015-05-05 DIAGNOSIS — M79641 Pain in right hand: Secondary | ICD-10-CM | POA: Diagnosis not present

## 2015-05-06 ENCOUNTER — Ambulatory Visit
Admission: RE | Admit: 2015-05-06 | Discharge: 2015-05-06 | Disposition: A | Discharge status: Home / Self Care | Payer: Medicare Other | Source: Ambulatory Visit | Attending: Oncology | Admitting: Oncology

## 2015-05-06 DIAGNOSIS — R928 Other abnormal and inconclusive findings on diagnostic imaging of breast: Secondary | ICD-10-CM | POA: Diagnosis not present

## 2015-05-06 DIAGNOSIS — C50311 Malignant neoplasm of lower-inner quadrant of right female breast: Secondary | ICD-10-CM

## 2015-05-12 ENCOUNTER — Other Ambulatory Visit: Payer: Self-pay | Admitting: Family Medicine

## 2015-05-13 NOTE — Telephone Encounter (Signed)
Refilled under dr copland for #30 with no rf for patient per dr blyths request. Dr. Charlett Blake out of office

## 2015-05-13 NOTE — Telephone Encounter (Signed)
OK to refill Klonazepam same sig, same strength, #30 with no rf

## 2015-05-17 ENCOUNTER — Encounter (HOSPITAL_COMMUNITY): Payer: Self-pay

## 2015-05-17 ENCOUNTER — Emergency Department (HOSPITAL_COMMUNITY)
Admission: EM | Admit: 2015-05-17 | Discharge: 2015-05-17 | Disposition: A | Discharge status: Home / Self Care | Payer: Medicare Other | Attending: Emergency Medicine | Admitting: Emergency Medicine

## 2015-05-17 ENCOUNTER — Emergency Department (HOSPITAL_COMMUNITY): Payer: Medicare Other

## 2015-05-17 DIAGNOSIS — Y998 Other external cause status: Secondary | ICD-10-CM | POA: Diagnosis not present

## 2015-05-17 DIAGNOSIS — S8991XA Unspecified injury of right lower leg, initial encounter: Secondary | ICD-10-CM | POA: Diagnosis not present

## 2015-05-17 DIAGNOSIS — I10 Essential (primary) hypertension: Secondary | ICD-10-CM | POA: Insufficient documentation

## 2015-05-17 DIAGNOSIS — Y9259 Other trade areas as the place of occurrence of the external cause: Secondary | ICD-10-CM | POA: Diagnosis not present

## 2015-05-17 DIAGNOSIS — M810 Age-related osteoporosis without current pathological fracture: Secondary | ICD-10-CM | POA: Diagnosis not present

## 2015-05-17 DIAGNOSIS — G4700 Insomnia, unspecified: Secondary | ICD-10-CM | POA: Diagnosis not present

## 2015-05-17 DIAGNOSIS — Y9389 Activity, other specified: Secondary | ICD-10-CM | POA: Insufficient documentation

## 2015-05-17 DIAGNOSIS — G629 Polyneuropathy, unspecified: Secondary | ICD-10-CM | POA: Insufficient documentation

## 2015-05-17 DIAGNOSIS — S02401A Maxillary fracture, unspecified, initial encounter for closed fracture: Secondary | ICD-10-CM

## 2015-05-17 DIAGNOSIS — J45909 Unspecified asthma, uncomplicated: Secondary | ICD-10-CM | POA: Insufficient documentation

## 2015-05-17 DIAGNOSIS — F329 Major depressive disorder, single episode, unspecified: Secondary | ICD-10-CM | POA: Diagnosis not present

## 2015-05-17 DIAGNOSIS — Z79899 Other long term (current) drug therapy: Secondary | ICD-10-CM | POA: Insufficient documentation

## 2015-05-17 DIAGNOSIS — E669 Obesity, unspecified: Secondary | ICD-10-CM | POA: Diagnosis not present

## 2015-05-17 DIAGNOSIS — Z85820 Personal history of malignant melanoma of skin: Secondary | ICD-10-CM | POA: Insufficient documentation

## 2015-05-17 DIAGNOSIS — Z853 Personal history of malignant neoplasm of breast: Secondary | ICD-10-CM | POA: Diagnosis not present

## 2015-05-17 DIAGNOSIS — K219 Gastro-esophageal reflux disease without esophagitis: Secondary | ICD-10-CM | POA: Insufficient documentation

## 2015-05-17 DIAGNOSIS — D649 Anemia, unspecified: Secondary | ICD-10-CM | POA: Insufficient documentation

## 2015-05-17 DIAGNOSIS — Z872 Personal history of diseases of the skin and subcutaneous tissue: Secondary | ICD-10-CM | POA: Insufficient documentation

## 2015-05-17 DIAGNOSIS — E785 Hyperlipidemia, unspecified: Secondary | ICD-10-CM | POA: Insufficient documentation

## 2015-05-17 DIAGNOSIS — Z7982 Long term (current) use of aspirin: Secondary | ICD-10-CM | POA: Diagnosis not present

## 2015-05-17 DIAGNOSIS — Z86011 Personal history of benign neoplasm of the brain: Secondary | ICD-10-CM | POA: Insufficient documentation

## 2015-05-17 DIAGNOSIS — M1990 Unspecified osteoarthritis, unspecified site: Secondary | ICD-10-CM | POA: Insufficient documentation

## 2015-05-17 DIAGNOSIS — S0240CA Maxillary fracture, right side, initial encounter for closed fracture: Secondary | ICD-10-CM | POA: Diagnosis not present

## 2015-05-17 DIAGNOSIS — W108XXA Fall (on) (from) other stairs and steps, initial encounter: Secondary | ICD-10-CM | POA: Insufficient documentation

## 2015-05-17 DIAGNOSIS — S0231XA Fracture of orbital floor, right side, initial encounter for closed fracture: Secondary | ICD-10-CM | POA: Diagnosis not present

## 2015-05-17 DIAGNOSIS — Z87448 Personal history of other diseases of urinary system: Secondary | ICD-10-CM | POA: Insufficient documentation

## 2015-05-17 DIAGNOSIS — S8992XA Unspecified injury of left lower leg, initial encounter: Secondary | ICD-10-CM | POA: Diagnosis not present

## 2015-05-17 DIAGNOSIS — S0990XA Unspecified injury of head, initial encounter: Secondary | ICD-10-CM | POA: Diagnosis present

## 2015-05-17 DIAGNOSIS — R51 Headache: Secondary | ICD-10-CM | POA: Diagnosis not present

## 2015-05-17 DIAGNOSIS — R0781 Pleurodynia: Secondary | ICD-10-CM | POA: Diagnosis not present

## 2015-05-17 DIAGNOSIS — W19XXXA Unspecified fall, initial encounter: Secondary | ICD-10-CM

## 2015-05-17 DIAGNOSIS — Z9889 Other specified postprocedural states: Secondary | ICD-10-CM | POA: Diagnosis not present

## 2015-05-17 DIAGNOSIS — S29001A Unspecified injury of muscle and tendon of front wall of thorax, initial encounter: Secondary | ICD-10-CM | POA: Insufficient documentation

## 2015-05-17 DIAGNOSIS — M109 Gout, unspecified: Secondary | ICD-10-CM | POA: Insufficient documentation

## 2015-05-17 DIAGNOSIS — Z8619 Personal history of other infectious and parasitic diseases: Secondary | ICD-10-CM | POA: Diagnosis not present

## 2015-05-17 MED ORDER — HYDROCODONE-ACETAMINOPHEN 5-325 MG PO TABS: 2.0000 | ORAL_TABLET | ORAL | Status: DC | PRN

## 2015-05-17 MED ORDER — HYDROCODONE-ACETAMINOPHEN 5-325 MG PO TABS
1.0000 | ORAL_TABLET | Freq: Once | ORAL | Status: AC
  Administered 2015-05-17: 1 via ORAL
  Filled 2015-05-17: qty 1

## 2015-05-17 MED ORDER — AMOXICILLIN-POT CLAVULANATE 875-125 MG PO TABS: 1.0000 | ORAL_TABLET | Freq: Two times a day (BID) | ORAL | Status: DC

## 2015-05-17 NOTE — ED Provider Notes (Signed)
CSN: [id number]     Arrival date & time 05/17/15  1023 History   First MD Initiated Contact with Patient 05/17/15 1208     Chief Complaint  Patient presents with  . Fall  . Knee Pain  . Head Injury  . Ribcage Pain       HPI Pt c/o bilateral kneeabrasions, R side headache/injury, and R side ribcage pain after a slip and fall this morning. Pain score 6/10. Pt reports that she slip and fell down "about 4 brick steps." Denies LOC. Denies blood thinners. Abrasions noted to both knees. Past Medical History  Diagnosis Date  . NICM (nonischemic cardiomyopathy) ([place]) 03/08/2010    likely 2/2 chemotx - a. Echo 2012: EF 45-50%;  b. Lex MV 2/12:  low risk, apical defect (small area of ischemia vs shifting breast atten);  c.  Echo 7/12: Normal wall thickness, EF 60-65%, normal wall motion, grade 1 diastolic dysfunction, mild LAE, PASP 32;   d. Lex MV 11/13:  EF 76%, no ischemia  . GERD 08/11/2008  . HYPERLIPIDEMIA 08/11/2008  . HYPERTENSION 08/11/2008  . LIPOMA 02/10/2009  . PREDIABETES 06/15/2009  . 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 Carpal tunnel syndrome of left wrist   . Neuropathy (H[facility])   . Interstitial cystitis   . Brain tumor ([place])   . DEPRESSION 08/11/2008  . 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 Osteoporosis   . Hyponatremia 08/28/2011  . Anemia 08/28/2011  . History of chicken pox   . H/O measles   . H/O mumps   . Dermatitis   . OA (osteoarthritis) 09/07/2011  . Shortness of breath   . Asthma   . History of blood transfusion   . UTI (lower urinary tract infection) 03/03/2012  . Constipation 03/03/2012  . 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 Obesity 09/28/2014  . Medicare annual wellness visit, subsequent 12/27/2014    Follows with Dr Amalia Hailey of urology Follows with Dr Posey Pronto at [facility] for opthamology Follows with Dr [name] of dermatology Follows with [facility] gastroenterology, last scope done in  2011 repeat in 10 years Last [facility] July 2015 should repeat every 1-2 years Last Pap December 2015, repeat in 3 years.   . Melanoma ([place]) 2008    DR. Ronnald Ramp  . Breast cancer ([place])   . Facial fracture [facility])    Past Surgical History  Procedure Laterality Date  . Cholecystectomy    . Carpal tunnel release      L wrist, ulnar nerve moved  . Elbow surgery      left  . Tubal ligation  1997  . Lipoma excision  03/28/2009    right leg  . Portacath placement  may 2011  . Craniotomy for tumor  2000  . Tonsillectomy  1958  . Melanoma excision    . [place] cath    . Port-a-cath removal  11/30/2010    [place]  . Total knee raplacement  01-2012    Right Knee  . Total knee arthroplasty  02/05/2012    Procedure: TOTAL KNEE ARTHROPLASTY;  Surgeon: Gearlean Alf, MD;  Location: [facility];  Service: Orthopedics;  Laterality: Right;  . Total knee arthroplasty Left 02/10/2013    Procedure: LEFT TOTAL KNEE ARTHROPLASTY;  Surgeon: Gearlean Alf, MD;  Location: [facility];  Service: Orthopedics;  Laterality: Left;  . Left heart catheterization with coronary  angiogram N/A 12/16/2013    Procedure: LEFT HEART CATHETERIZATION WITH CORONARY ANGIOGRAM;  Surgeon: Peter M [name], MD;  Location: [facility] CATH LAB;  Service: Cardiovascular;  Laterality: N/A;  . Breast lumpectomy  04/2009    RIGHT FOR BREAST CANCER-CHEMO/RADIATION X 1 YEAR  . Breast reduction surgery Bilateral 05/04/2014    Procedure: MAMMARY REDUCTION  (BREAST);  Surgeon: Cristine Polio, MD;  Location: [facility];  Service: Plastics;  Laterality: Bilateral;  . Knee arthroscopy Left 10/12/2014    Procedure: LEFT KNEE ARTHROSCOPY ;  Surgeon: Gaynelle Arabian, MD;  Location: [facility];  Service: Orthopedics;  Laterality: Left;  . Synovectomy Left 10/12/2014    Procedure: WITH SYNOVECTOMY;  Surgeon: Gaynelle Arabian, MD;  Location: [facility];  Service: Orthopedics;  Laterality: Left;   Family History  Problem Relation Age of Onset  . Breast cancer Mother   . Lung  cancer Mother   . Breast cancer Mother     sarcoma  . Hypertension Mother   . Colon cancer Neg Hx   . Prostate cancer Father   . Congestive Heart Failure Father   . Prostate cancer Father   . Prostate cancer Brother   . Prostate cancer Brother   . Stomach cancer Maternal Aunt   . Uterine cancer Maternal Aunt   . Down syndrome Son   . Heart disease Maternal Grandfather     MI  . Heart attack Father    Social History  Substance Use Topics  . Smoking status: Never Smoker   . Smokeless tobacco: Never Used  . Alcohol Use: No   OB History    Gravida Para Term Preterm AB TAB SAB Ectopic Multiple Living   _0 Review of Systems  Eyes: Negative for photophobia and visual disturbance.  Neurological: Negative for syncope and numbness.  All other systems reviewed and are negative.     Allergies  Dilaudid  Home Medications   Prior to Admission medications   Medication Sig Start Date End Date Taking? Authorizing Provider  acetaminophen (TYLENOL) 500 MG tablet Take 1,000 mg by mouth every 6 (six) hours as needed for moderate pain or headache.   Yes Historical Provider, MD  alendronate (FOSAMAX) 70 MG tablet Take 1 tablet (70 mg total) by mouth every 7 (seven) days. Take with a full glass of water on an empty stomach. Patient not taking: Reported on 06/01/2015 04/29/15  Yes Huel Cote, NP  aspirin EC 81 MG tablet Take 81 mg by mouth daily.   Yes Historical Provider, MD  B Complex-C (SUPER B COMPLEX PO) Take 1 tablet by mouth daily.   Yes Historical Provider, MD  carvedilol (COREG) 12.5 MG tablet Take 1 tablet (12.5 mg total) by mouth 2 (two) times daily with a meal. 12/04/14  Yes Lelon Perla, MD  Cholecalciferol 2000 UNITS CAPS Take 2,000 Units by mouth daily.   Yes Historical Provider, MD  clonazePAM (KLONOPIN) 1 MG tablet TAKE 1/2 TABLET BY MOUTH AT BEDTIME. 05/13/15  Yes Jessica C Copland, MD  CRESTOR 20 MG tablet TAKE 1 TABLET (20 MG TOTAL) BY MOUTH AT BEDTIME.  05/29/14  Yes Mosie Lukes, MD  FERROCITE 324 MG TABS tablet TAKE 1 TABLET (106 MG OF IRON TOTAL) BY MOUTH DAILY. 04/12/15  Yes Mosie Lukes, MD  folic acid (FOLVITE) 1 MG tablet TAKE 1 TABLET BY MOUTH EVERY DAY 02/11/15  Yes Mosie Lukes, MD  gabapentin (  NEURONTIN) 300 MG capsule Take 300 mg by mouth 2 (two) times daily. Reported on 05/24/2015   Yes Historical Provider, MD  hydrOXYzine (ATARAX/VISTARIL) 25 MG tablet Take 25 mg by mouth at bedtime.   Yes Historical Provider, MD  pentosan polysulfate (ELMIRON) 100 MG capsule Take 200 mg by mouth 2 (two) times daily. 02/07/12  Yes Arlee Muslim, PA-C  RA KRILL OIL 500 MG CAPS Take 1 tablet by mouth daily.   Yes Historical Provider, MD  ranitidine (ZANTAC) 150 MG tablet Take 1 tablet (150 mg total) by mouth 2 (two) times daily as needed for heartburn. 12/21/14  Yes Mosie Lukes, MD  telmisartan (MICARDIS) 80 MG tablet TAKE 1 TABLET (80 MG TOTAL) BY MOUTH DAILY. Patient taking differently: Take 40 mg by mouth daily.  09/28/14  Yes Mosie Lukes, MD  venlafaxine XR (EFFEXOR-XR) 150 MG 24 hr capsule TAKE ONE CAPSULE BY MOUTH EVERY DAY 02/11/15  Yes Mosie Lukes, MD  baclofen (LIORESAL) 10 MG tablet Take 1 tablet (10 mg total) by mouth 3 (three) times daily. 05/28/15   Renee A Kuneff, DO  HYDROcodone-acetaminophen (NORCO/VICODIN) 5-325 MG tablet Take 2 tablets by mouth every 6 (six) hours as needed. Patient not taking: Reported on 06/01/2015 05/24/15   Renee A Kuneff, DO   BP 124/77 mmHg  Pulse 63  Temp(Src) 98.5 F (36.9 C) (Oral)  Resp 16  SpO2 100%  LMP 01/30/1994 Physical Exam  Constitutional: She is oriented to person, place, and time. She appears well-developed and well-nourished. No distress.  HENT:  Head: Normocephalic. Head is without raccoon's eyes.    Eyes: Conjunctivae and EOM are normal. Pupils are equal, round, and reactive to light.  Neck: Normal range of motion.  Cardiovascular: Normal rate and intact distal pulses.     Pulmonary/Chest: No respiratory distress.  Abdominal: Normal appearance. She exhibits no distension.  Musculoskeletal: Normal range of motion.  Neurological: She is alert and oriented to person, place, and time. No cranial nerve deficit.  Skin: Skin is warm and dry. No rash noted.  Psychiatric: She has a normal mood and affect. Her behavior is normal.  Nursing note and vitals reviewed.   ED Course  Procedures (including critical care time) Results for orders placed or performed in visit on 12/21/14  Vitamin D (25 hydroxy)  Result Value Ref Range   VITD 35.94 30.00 - 100.00 ng/mL  CBC  Result Value Ref Range   WBC 6.2 4.0 - 10.5 K/uL   RBC 4.14 3.87 - 5.11 Mil/uL   Platelets 235.0 150.0 - 400.0 K/uL   Hemoglobin 11.9 (L) 12.0 - 15.0 g/dL   HCT 36.8 36.0 - 46.0 %   MCV 88.9 78.0 - 100.0 fl   MCHC 32.5 30.0 - 36.0 g/dL   RDW 14.0 11.5 - 15.5 %  TSH  Result Value Ref Range   TSH 2.15 0.35 - 4.50 uIU/mL  Lipid panel  Result Value Ref Range   Cholesterol 200 0 - 200 mg/dL   Triglycerides 281.0 (H) 0.0 - 149.0 mg/dL   HDL 39.90 >39.00 mg/dL   VLDL 56.2 (H) 0.0 - 40.0 mg/dL   Total CHOL/HDL Ratio 5    NonHDL 160.43   Comprehensive metabolic panel  Result Value Ref Range   Sodium 144 135 - 145 mEq/L   Potassium 4.4 3.5 - 5.1 mEq/L   Chloride 105 96 - 112 mEq/L   CO2 32 19 - 32 mEq/L   Glucose, Bld 81 70 - 99 mg/dL  BUN 13 6 - 23 mg/dL   Creatinine, Ser 0.80 0.40 - 1.20 mg/dL   Total Bilirubin 0.4 0.2 - 1.2 mg/dL   Alkaline Phosphatase 99 39 - 117 U/L   AST 27 0 - 37 U/L   ALT 27 0 - 35 U/L   Total Protein 6.9 6.0 - 8.3 g/dL   Albumin 4.1 3.5 - 5.2 g/dL   Calcium 9.8 8.4 - 10.5 mg/dL   GFR 75.25 >60.00 mL/min  Hemoglobin A1c  Result Value Ref Range   Hgb A1c MFr Bld 5.9 4.6 - 6.5 %  Hepatitis C Antibody  Result Value Ref Range   HCV Ab NEGATIVE NEGATIVE  LDL cholesterol, direct  Result Value Ref Range   Direct LDL 120.0 mg/dL   \\  Dg Ribs Unilateral W/chest  Right  05/17/2015  CLINICAL DATA:  Fall.  Anterior right-sided rib pain. EXAM: RIGHT RIBS AND CHEST - 3+ VIEW COMPARISON:  CT of the chest 11/03/2013. FINDINGS: Heart size is normal. The lung volumes are low. The lungs are clear. There is no edema or effusion to suggest failure. The right-sided ribs are intact. No acute or healing fracture is present. Surgical clips are present at the gallbladder fossa. Degenerative changes are present in the thoracic spine. IMPRESSION: 1. No evidence for acute or healing right-sided rib fracture. 2. Low lung volumes. 3. Cholecystectomy. Electronically Signed   By: San Morelle M.D.   On: 05/17/2015 12:50   Ct Head Wo Contrast  05/17/2015  CLINICAL DATA:  Fall in garage, now with headache EXAM: CT HEAD WITHOUT CONTRAST TECHNIQUE: Contiguous axial images were obtained from the base of the skull through the vertex without intravenous contrast. COMPARISON:  02/11/2012; brain MRI - 10/08/2012 FINDINGS: There is a large amount of subcutaneous emphysema about the caudal aspect of the right orbit and globe (image 2, series 3). This finding is associated with apparent hematocrit level within in the right maxillary sinus (image 1, series 3). Otherwise, normal noncontrast appearance of the right orbit and globe. No definitive retro bulbar hematoma. The right-sided lamina papyracea appears preserved. Remaining paranasal sinuses and mastoid air cells are normally aerated. Post right frontal craniotomy. Bilateral basal ganglial calcifications. Gray-white differentiation is maintained. No CT evidence acute large territory infarct. No intraparenchymal or extra-axial mass or hemorrhage. Normal size and configuration of the ventricles and basilar cisterns. No midline shift. IMPRESSION: 1. Large amount of subcutaneous emphysema about the caudal aspect of the right orbit and globe with associated hematocrit level within the right maxillary sinus - constellation of findings are worrisome  for a facial fracture involving (at least) the anterior wall of the maxillary sinus (potentially an orbital blowout fracture), not imaged on the present examination. Further evaluation with dedicated maxillofacial CT is recommended. 2. No acute intracranial process. 3. Sequela of remote right-sided frontal craniotomy. Critical Value/emergent results were called by telephone at the time of interpretation on 05/17/2015 at 12:44 pm to Dr. Leonard Schwartz , who verbally acknowledged these results. Electronically Signed   By: Sandi Mariscal M.D.   On: 05/17/2015 12:47   \   Ct Maxillofacial Wo Cm  05/17/2015  CLINICAL DATA:  Facial and orbital trauma secondary to a fall in his garage today. EXAM: CT MAXILLOFACIAL WITHOUT CONTRAST TECHNIQUE: Multidetector CT imaging of the maxillofacial structures was performed. Multiplanar CT image reconstructions were also generated. A small metallic BB was placed on the right temple in order to reliably differentiate right from left. COMPARISON:  CT scan of the head  dated 02/11/2012 FINDINGS: There is a slightly depressed blowout fracture of the floor of the low right orbit. The fractured area measures 10 x 19 mm. The inferior rectus muscle is subluxed into the fracture and is impinged upon by the edge of the broken bone of the orbital floor. There is blood in the right maxillary sinus. There is air in the soft tissues just above the inferior orbital rim. No other facial fractures. Moderate arthritis of the temporomandibular joints, right worse than left. Previous right frontotemporal craniotomy. IMPRESSION: Acute blowout fracture of the floor of the right orbit with partial entrapment of the inferior rectus muscle. Electronically Signed   By: Lorriane Shire M.D.   On: 05/17/2015 14:20       EKG Interpretation   Date/Time:  Monday May 17 2015 12:25:27 EDT Ventricular Rate:  65 PR Interval:  141 QRS Duration: 76 QT Interval:  393 QTC Calculation: 409 R Axis:   -15 Text  Interpretation:  Sinus rhythm Inferior infarct, old No significant  change since last tracing Confirmed by Florina Glas  MD, Javaria Knapke [phone]) on  05/17/2015 12:47:35 PM      MDM   Final diagnoses:  Fall  Closed fracture of maxilla, initial encounter (HCC)        Leonard Schwartz, MD 06/04/15 1539

## 2015-05-17 NOTE — Discharge Instructions (Signed)
Fall Prevention in the Home  Falls can cause injuries and can affect people from all age groups. There are many simple things that you can do to make your home safe and to help prevent falls. WHAT CAN I DO ON THE OUTSIDE OF MY HOME?  Regularly repair the edges of walkways and driveways and fix any cracks.  Remove high doorway thresholds.  Trim any shrubbery on the main path into your home.  Use bright outdoor lighting.  Clear walkways of debris and clutter, including tools and rocks.  Regularly check that handrails are securely fastened and in good repair. Both sides of any steps should have handrails.  Install guardrails along the edges of any raised decks or porches.  Have leaves, snow, and ice cleared regularly.  Use sand or salt on walkways during winter months.  In the garage, clean up any spills right away, including grease or oil spills. WHAT CAN I DO IN THE BATHROOM?  Use night lights.  Install grab bars by the toilet and in the tub and shower. Do not use towel bars as grab bars.  Use non-skid mats or decals on the floor of the tub or shower.  If you need to sit down while you are in the shower, use a plastic, non-slip stool..  Keep the floor dry. Immediately clean up any water that spills on the floor.  Remove soap buildup in the tub or shower on a regular basis.  Attach bath mats securely with double-sided non-slip rug tape.  Remove throw rugs and other tripping hazards from the floor. WHAT CAN I DO IN THE BEDROOM?  Use night lights.  Make sure that a bedside light is easy to reach.  Do not use oversized bedding that drapes onto the floor.  Have a firm chair that has side arms to use for getting dressed.  Remove throw rugs and other tripping hazards from the floor. WHAT CAN I DO IN THE KITCHEN?   Clean up any spills right away.  Avoid walking on wet floors.  Place frequently used items in easy-to-reach places.  If you need to reach for something  above you, use a sturdy step stool that has a grab bar.  Keep electrical cables out of the way.  Do not use floor polish or wax that makes floors slippery. If you have to use wax, make sure that it is non-skid floor wax.  Remove throw rugs and other tripping hazards from the floor. WHAT CAN I DO IN THE STAIRWAYS?  Do not leave any items on the stairs.  Make sure that there are handrails on both sides of the stairs. Fix handrails that are broken or loose. Make sure that handrails are as long as the stairways.  Check any carpeting to make sure that it is firmly attached to the stairs. Fix any carpet that is loose or worn.  Avoid having throw rugs at the top or bottom of stairways, or secure the rugs with carpet tape to prevent them from moving.  Make sure that you have a light switch at the top of the stairs and the bottom of the stairs. If you do not have them, have them installed. WHAT ARE SOME OTHER FALL PREVENTION TIPS?  Wear closed-toe shoes that fit well and support your feet. Wear shoes that have rubber soles or low heels.  When you use a stepladder, make sure that it is completely opened and that the sides are firmly locked. Have someone hold the ladder while you   are using it. Do not climb a closed stepladder.  Add color or contrast paint or tape to grab bars and handrails in your home. Place contrasting color strips on the first and last steps.  Use mobility aids as needed, such as canes, walkers, scooters, and crutches.  Turn on lights if it is dark. Replace any light bulbs that burn out.  Set up furniture so that there are clear paths. Keep the furniture in the same spot.  Fix any uneven floor surfaces.  Choose a carpet design that does not hide the edge of steps of a stairway.  Be aware of any and all pets.  Review your medicines with your healthcare provider. Some medicines can cause dizziness or changes in blood pressure, which increase your risk of falling. Talk  with your health care provider about other ways that you can decrease your risk of falls. This may include working with a physical therapist or trainer to improve your strength, balance, and endurance.   This information is not intended to replace advice given to you by your health care provider. Make sure you discuss any questions you have with your health care provider.   Document Released: 01/06/2002 Document Revised: 06/02/2014 Document Reviewed: 02/20/2014 Elsevier Interactive Patient Education 2016 [employer]  

## 2015-05-17 NOTE — ED Notes (Signed)
Pt c/o bilateral knee pain, R side headache/injury, and R side ribcage pain after a slip and fall this morning.  Pain score 6/10.  Pt reports that she slip and fell down "about 4 brick steps."  Denies LOC.  Denies blood thinners.  Abrasions noted to both knees.

## 2015-05-17 NOTE — ED Notes (Signed)
MD at bedside. 

## 2015-05-17 NOTE — ED Notes (Signed)
Patient transported to CT 

## 2015-05-18 ENCOUNTER — Encounter: Payer: Medicare Other | Admitting: Nurse Practitioner

## 2015-05-18 ENCOUNTER — Telehealth: Payer: Self-pay | Admitting: Nurse Practitioner

## 2015-05-18 ENCOUNTER — Other Ambulatory Visit: Payer: Self-pay | Admitting: Nurse Practitioner
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
## 2015-05-18 NOTE — Telephone Encounter (Signed)
spoke with pt confirmed appt resched for may

## 2015-05-19 DIAGNOSIS — S0231XA Fracture of orbital floor, right side, initial encounter for closed fracture: Secondary | ICD-10-CM | POA: Diagnosis not present

## 2015-05-19 DIAGNOSIS — D352 Benign neoplasm of pituitary gland: Secondary | ICD-10-CM | POA: Diagnosis not present

## 2015-05-19 DIAGNOSIS — S0231XB Fracture of orbital floor, right side, initial encounter for open fracture: Secondary | ICD-10-CM | POA: Diagnosis not present

## 2015-05-24 ENCOUNTER — Telehealth: Payer: Self-pay | Admitting: Family Medicine

## 2015-05-24 ENCOUNTER — Ambulatory Visit (HOSPITAL_BASED_OUTPATIENT_CLINIC_OR_DEPARTMENT_OTHER)
Admission: RE | Admit: 2015-05-24 | Discharge: 2015-05-24 | Disposition: A | Discharge status: Home / Self Care | Payer: Medicare Other | Source: Ambulatory Visit | Attending: Family Medicine | Admitting: Family Medicine

## 2015-05-24 ENCOUNTER — Other Ambulatory Visit: Payer: Self-pay | Admitting: Family Medicine

## 2015-05-24 ENCOUNTER — Encounter: Payer: Self-pay | Admitting: Family Medicine

## 2015-05-24 ENCOUNTER — Ambulatory Visit (INDEPENDENT_AMBULATORY_CARE_PROVIDER_SITE_OTHER): Payer: Medicare Other | Admitting: Family Medicine

## 2015-05-24 VITALS — BP 126/62 | HR 86 | Temp 98.4°F | Resp 20 | Ht 64.0 in | Wt 205.2 lb

## 2015-05-24 DIAGNOSIS — R079 Chest pain, unspecified: Secondary | ICD-10-CM | POA: Diagnosis not present

## 2015-05-24 DIAGNOSIS — M7989 Other specified soft tissue disorders: Secondary | ICD-10-CM

## 2015-05-24 DIAGNOSIS — M79605 Pain in left leg: Secondary | ICD-10-CM | POA: Diagnosis not present

## 2015-05-24 DIAGNOSIS — R0789 Other chest pain: Secondary | ICD-10-CM

## 2015-05-24 DIAGNOSIS — R0600 Dyspnea, unspecified: Secondary | ICD-10-CM | POA: Diagnosis not present

## 2015-05-24 DIAGNOSIS — R0602 Shortness of breath: Secondary | ICD-10-CM | POA: Diagnosis not present

## 2015-05-24 MED ORDER — HYDROCODONE-ACETAMINOPHEN 5-325 MG PO TABS: 2.0000 | ORAL_TABLET | Freq: Four times a day (QID) | ORAL | Status: DC | PRN

## 2015-05-24 NOTE — Telephone Encounter (Signed)
Patient called stating that she fell last week and is now having SOB and Chest pain. Was seen in ER. Transferred to [facility]. Spoke with [employer].

## 2015-05-24 NOTE — Progress Notes (Signed)
Patient ID: NOHEALANI MEDINGER, female   DOB: [birth year], 70 y.o.   MRN: [id number]    CHRISSIE DACQUISTO , April 17, 1945, 70 y.o., female MRN: [id number]  CC: Sternal pain  Subjective: Pt called triage line and was given a same day appt. Pt presents for an acute OV with complaints of sternal pain and shortness of breath since her fall 05/17/2015. She feels her pain is not improving and is worsening. Her shortness of breath is worse with laying flat. Her chest hurts with deep inspiration. 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 Patient sustained an orbital blow out fracture, in which she has follow up with ENT Patient feels trying to get out of bed makes everything worse. She denies bruising or swelling in this location. She was hit on the right side of her  Ribs, with negative imaging for fracture.   EKG at ED 05/17/2015: Date/Time: Monday May 17 2015 12:25:27 EDT Ventricular Rate: 65 PR Interval: 141 QRS Duration: 76 QT Interval: 393 QTC Calculation: 409 R Axis: -15 Text Interpretation: Sinus rhythm Inferior infarct, old No significant  change since last tracing Confirmed by BEATON MD, ROBERT [phone]) on    05/17/2015 12:47:35 PM  Dg Ribs Unilateral W/chest Right  05/17/2015  CLINICAL DATA:  Fall.  Anterior right-sided rib pain. EXAM: RIGHT RIBS AND CHEST - 3+ VIEW COMPARISON:  CT of the chest 11/03/2013. FINDINGS: Heart size is normal. The lung volumes are low. The lungs are clear. There is no edema or effusion to suggest failure. The right-sided ribs are intact. No acute or healing fracture is present. Surgical clips are present at the gallbladder fossa. Degenerative changes are present in the thoracic spine. IMPRESSION: 1. No evidence for acute or healing right-sided rib fracture. 2. Low lung volumes. 3. Cholecystectomy. Electronically Signed   By: San Morelle M.D.   On: 05/17/2015 12:50   Ct Head Wo Contrast  05/17/2015   CLINICAL DATA:  Fall in garage, now with headache EXAM: CT HEAD WITHOUT CONTRAST TECHNIQUE: Contiguous axial images were obtained from the base of the skull through the vertex without intravenous contrast. COMPARISON:  02/11/2012; brain MRI - 10/08/2012 FINDINGS: There is a large amount of subcutaneous emphysema about the caudal aspect of the right orbit and globe (image 2, series 3). This finding is associated with apparent hematocrit level within in the right maxillary sinus (image 1, series 3). Otherwise, normal noncontrast appearance of the right orbit and globe. No definitive retro bulbar hematoma. The right-sided lamina papyracea appears preserved. Remaining paranasal sinuses and mastoid air cells are normally aerated. Post right frontal craniotomy. Bilateral basal ganglial calcifications. Gray-white differentiation is maintained. No CT evidence acute large territory infarct. No intraparenchymal or extra-axial mass or hemorrhage. Normal size and configuration of the ventricles and basilar cisterns. No midline shift. IMPRESSION: 1. Large amount of subcutaneous emphysema about the caudal aspect of the right orbit and globe with associated hematocrit level within the right maxillary sinus - constellation of findings are worrisome for a facial fracture involving (at least) the anterior wall of the maxillary sinus (potentially an orbital blowout fracture), not imaged on the present examination. Further evaluation with dedicated maxillofacial CT is recommended. 2. No acute intracranial process. 3. Sequela of remote right-sided frontal craniotomy. Critical Value/emergent results were called by telephone at the time of interpretation on 05/17/2015 at 12:44 pm to Dr. Leonard Schwartz , who verbally acknowledged these results. Electronically Signed   By: Jenny Reichmann  Watts M.D.   On: 05/17/2015 12:47   Ct Maxillofacial Wo Cm  05/17/2015  CLINICAL DATA:  Facial and orbital trauma secondary to a fall in his garage today. EXAM: CT  MAXILLOFACIAL WITHOUT CONTRAST TECHNIQUE: Multidetector CT imaging of the maxillofacial structures was performed. Multiplanar CT image reconstructions were also generated. A small metallic BB was placed on the right temple in order to reliably differentiate right from left. COMPARISON:  CT scan of the head dated 02/11/2012 FINDINGS: There is a slightly depressed blowout fracture of the floor of the low right orbit. The fractured area measures 10 x 19 mm. The inferior rectus muscle is subluxed into the fracture and is impinged upon by the edge of the broken bone of the orbital floor. There is blood in the right maxillary sinus. There is air in the soft tissues just above the inferior orbital rim. No other facial fractures. Moderate arthritis of the temporomandibular joints, right worse than left. Previous right frontotemporal craniotomy. IMPRESSION: Acute blowout fracture of the floor of the right orbit with partial entrapment of the inferior rectus muscle. Electronically Signed   By: Lorriane Shire M.D.   On: 05/17/2015 14:20  Left leg swoolemn No blood thinner. Hurts wihr deep ispiration.   Allergies  Allergen Reactions  . Dilaudid [Hydromorphone Hcl] Itching   Social History  Substance Use Topics  . Smoking status: Never Smoker   . Smokeless tobacco: Never Used  . Alcohol Use: No   Past Medical History  Diagnosis Date  . NICM (nonischemic cardiomyopathy) ([place]) 03/08/2010    likely 2/2 chemotx - a. Echo 2012: EF 45-50%;  b. Lex MV 2/12:  low risk, apical defect (small area of ischemia vs shifting breast atten);  c.  Echo 7/12: Normal wall thickness, EF 60-65%, normal wall motion, grade 1 diastolic dysfunction, mild LAE, PASP 32;   d. Lex MV 11/13:  EF 76%, no ischemia  . GERD 08/11/2008  . HYPERLIPIDEMIA 08/11/2008  . HYPERTENSION 08/11/2008  . LIPOMA 02/10/2009  . PREDIABETES 06/15/2009  . 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 Carpal tunnel syndrome of left wrist   . Neuropathy  ([facility])   . Interstitial cystitis   . Brain tumor ([place])   . DEPRESSION 08/11/2008  . 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 Osteoporosis   . Hyponatremia 08/28/2011  . Anemia 08/28/2011  . History of chicken pox   . H/O measles   . H/O mumps   . Dermatitis   . OA (osteoarthritis) 09/07/2011  . Shortness of breath   . Asthma   . History of blood transfusion   . UTI (lower urinary tract infection) 03/03/2012  . Constipation 03/03/2012  . 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 Obesity 09/28/2014  . Medicare annual wellness visit, subsequent 12/27/2014    Follows with Dr Amalia Hailey of urology Follows with Dr Posey Pronto at [facility] for opthamology Follows with Dr [name] of dermatology Follows with [facility] gastroenterology, last scope done in 2011 repeat in 10 years Last [facility] July 2015 should repeat every 1-2 years Last Pap December 2015, repeat in 3 years.   . Melanoma ([place]) 2008    DR. Ronnald Ramp  . Breast cancer ([place])   . Facial fracture [facility])    Past Surgical History  Procedure Laterality Date  . Cholecystectomy    . Carpal tunnel release      L  wrist, ulnar nerve moved  . Elbow surgery      left  . Tubal ligation  1997  . Lipoma excision  03/28/2009    right leg  . Portacath placement  may 2011  . Craniotomy for tumor  2000  . Tonsillectomy  1958  . Melanoma excision    . [place] cath    . Port-a-cath removal  11/30/2010    [place]  . Total knee raplacement  01-2012    Right Knee  . Total knee arthroplasty  02/05/2012    Procedure: TOTAL KNEE ARTHROPLASTY;  Surgeon: Gearlean Alf, MD;  Location: [facility];  Service: Orthopedics;  Laterality: Right;  . Total knee arthroplasty Left 02/10/2013    Procedure: LEFT TOTAL KNEE ARTHROPLASTY;  Surgeon: Gearlean Alf, MD;  Location: [facility];  Service: Orthopedics;  Laterality: Left;  . Left heart catheterization with coronary angiogram N/A 12/16/2013    Procedure: LEFT HEART CATHETERIZATION  WITH CORONARY ANGIOGRAM;  Surgeon: Peter M [name], MD;  [facility];  Service: Cardiovascular;  Laterality: N/A;  . Breast lumpectomy  04/2009    RIGHT FOR BREAST CANCER-CHEMO/RADIATION X 1 YEAR  . Breast reduction surgery Bilateral 05/04/2014    Procedure: MAMMARY REDUCTION  (BREAST);  Surgeon: Cristine Polio, MD;  Location: [facility];  Service: Plastics;  Laterality: Bilateral;  . Knee arthroscopy Left 10/12/2014    Procedure: LEFT KNEE ARTHROSCOPY ;  Surgeon: Gaynelle Arabian, MD;  Location: [facility];  Service: Orthopedics;  Laterality: Left;  . Synovectomy Left 10/12/2014    Procedure: WITH SYNOVECTOMY;  Surgeon: Gaynelle Arabian, MD;  Location: [facility];  Service: Orthopedics;  Laterality: Left;   Family History  Problem Relation Age of Onset  . Breast cancer Mother   . Lung cancer Mother   . Breast cancer Mother     sarcoma  . Hypertension Mother   . Colon cancer Neg Hx   . Prostate cancer Father   . Congestive Heart Failure Father   . Prostate cancer Father   . Prostate cancer Brother   . Prostate cancer Brother   . Stomach cancer Maternal Aunt   . Uterine cancer Maternal Aunt   . Down syndrome Son   . Heart disease Maternal Grandfather     MI  . Heart attack Father      Medication List       This list is accurate as of: 05/24/15  2:48 PM.  Always use your most recent med list.               acetaminophen 500 MG tablet  Commonly known as:  TYLENOL  Take 1,000 mg by mouth every 6 (six) hours as needed for moderate pain or headache.     alendronate 70 MG tablet  Commonly known as:  FOSAMAX  Take 1 tablet (70 mg total) by mouth every 7 (seven) days. Take with a full glass of water on an empty stomach.     aspirin EC 81 MG tablet  Take 81 mg by mouth daily.     carvedilol 12.5 MG tablet  Commonly known as:  COREG  Take 1 tablet (12.5 mg total) by mouth 2 (two) times daily with a meal.     Cholecalciferol 2000 units Caps  Take 2,000 Units by  mouth daily.     clonazePAM 1 MG tablet  Commonly known as:  KLONOPIN  TAKE 1/2 TABLET BY MOUTH AT BEDTIME.     CRESTOR 20 MG tablet  Generic drug:  rosuvastatin  TAKE 1 TABLET (20 MG TOTAL) BY MOUTH AT BEDTIME.     FERROCITE 324 (106 Fe) MG Tabs tablet  Generic drug:  Ferrous Fumarate  TAKE 1 TABLET (106 MG OF IRON TOTAL) BY MOUTH DAILY.     folic acid 1 MG tablet  Commonly known as:  FOLVITE  TAKE 1 TABLET BY MOUTH EVERY DAY     gabapentin 300 MG capsule  Commonly known as:  NEURONTIN  Take 300 mg by mouth 2 (two) times daily. Reported on 05/24/2015     HYDROcodone-acetaminophen 5-325 MG tablet  Commonly known as:  NORCO/VICODIN  Take 2 tablets by mouth every 4 (four) hours as needed.     hydrOXYzine 25 MG tablet  Commonly known as:  ATARAX/VISTARIL  Take 25 mg by mouth at bedtime.     pentosan polysulfate 100 MG capsule  Commonly known as:  ELMIRON  Take 200 mg by mouth 2 (two) times daily.     RA KRILL OIL 500 MG Caps  Take 1 tablet by mouth daily.     ranitidine 150 MG tablet  Commonly known as:  ZANTAC  Take 1 tablet (150 mg total) by mouth 2 (two) times daily as needed for heartburn.     SUPER B COMPLEX PO  Take 1 tablet by mouth daily.     telmisartan 80 MG tablet  Commonly known as:  MICARDIS  TAKE 1 TABLET (80 MG TOTAL) BY MOUTH DAILY.     venlafaxine XR 150 MG 24 hr capsule  Commonly known as:  EFFEXOR-XR  TAKE ONE CAPSULE BY MOUTH EVERY DAY        ROS: Negative, with the exception of above mentioned in HPI  Objective:  BP 126/62 mmHg  Pulse 86  Temp(Src) 98.4 F (36.9 C)  Resp 20  Ht '5\' 4"'  (1.626 m)  Wt 205 lb 4 oz (93.101 kg)  BMI 35.21 kg/m2  SpO2 97%  LMP 01/30/1994 Body mass index is 35.21 kg/(m^2). Gen: Afebrile. No acute distress. Nontoxic in appearance well developed well nourished female pt.  HENT: [place]. Bruising left orbital area, left nasolabial fold. MMM, no oral lesions.  Eyes:Pupils Equal Round Reactive to light, Extraocular  movements intact,  Conjunctiva without redness, discharge or icterus. CV: RRR Chest: CTAB, no wheeze or crackles. Good air movement, normal resp effort.  MSK: moderate to severe soft tissue swelling with bruising left lateral shin/calf. Neg homans. Equal pulses bilaterally LE. Good cap refill distally.  Skin: No rashes, purpura or petechiae. Bruising (yellow-green) right breast. Bruising bilateral shins extended to left foot. Multiple healing small lacerations with mid erythema left shin.  Neuro: mild limp. PERLA. EOMi. Alert. Oriented x3  Psych: Normal affect, dress and demeanor. Normal speech. Normal thought content and judgment.  Assessment/Plan: ADIVA BOETTNER is a 70 y.o. female present for acute OV for  Left leg swelling.shortness of breath - traumatic injury related, bilateral total knee replacement history. Left leg swollen a great deal more than right leg. With worsening shortness of breath and pain with deep inspiration, favor ruling out blood clot/dvt and then consider proceeding with either CT imaging vs ortho referral (Dr. Ronnie Derby) - Ultrasound doppler venous legs bilat; Future   Sternal pain/ shortness of breath - Extremely tender sternum, especially over xyphoid process. No bruising over tender area. Bruising present on right  breast near area. R/o sternal/xiphyoid fracture and effusion for dyspnea. - Xray today.   - refills on [place] today q 6 hours  prn - DG Chest 2 View; Future - Pt advised if shortness of breath, swelling or pain worsens pt is to be seen immediately.    electronically signed by:  Howard Pouch, DO  [place]

## 2015-05-24 NOTE — Patient Instructions (Signed)
CXR today [place] of legs scheduled.  If worsening symptoms or shortness of breath please go to ED immediately.

## 2015-05-24 NOTE — Telephone Encounter (Signed)
[facility] Day - Client TELEPHONE ADVICE RECORD [facility] Patient Name: [name] DOB: [birth year] Initial Comment Caller states she is having SOB and chest pain. Nurse Assessment Nurse: Dimas Chyle, RN, [name] Date/Time Eilene Ghazi Time): 05/24/2015 10:14:40 AM Confirm and document reason for call. If symptomatic, describe symptoms. You must click the next button to save text entered. ---Caller states she is having SOB and chest pain. Happened on 05/17/15. Has the patient traveled out of the country within the last 30 days? ---No Does the patient have any new or worsening symptoms? ---Yes Will a triage be completed? ---Yes Related visit to physician within the last 2 weeks? ---Yes Does the PT have any chronic conditions? (i.e. diabetes, asthma, etc.) ---Yes List chronic conditions. ---HTN Is this a behavioral health or substance abuse call? ---No Guidelines Guideline Title Affirmed Question Affirmed Notes Chest Pain [1] Chest pain lasts > 5 minutes AND [2] occurred > 3 days ago (72 hours) AND [3] NO chest pain or cardiac symptoms now Final Disposition User See Physician within [place], RN, [employer] Referrals REFERRED TO PCP OFFICE Disagree/Comply: Leta Baptist

## 2015-05-25 ENCOUNTER — Telehealth: Payer: Self-pay | Admitting: Family Medicine

## 2015-05-25 DIAGNOSIS — R0789 Other chest pain: Secondary | ICD-10-CM

## 2015-05-25 DIAGNOSIS — R0600 Dyspnea, unspecified: Secondary | ICD-10-CM

## 2015-05-25 DIAGNOSIS — W19XXXA Unspecified fall, initial encounter: Secondary | ICD-10-CM

## 2015-05-25 NOTE — Telephone Encounter (Signed)
Pt was contacted to discuss imagining results and follow up.  - Imaging did not show signs of DVT, CXR was stable. - Pt is still not improved and feels worse.  - discussed CT scan for worsening symptoms and pt would like to have completed.  Diane  - BMP and CT ordered.  - Pt to f/u on Friday, please make sure she has an appt Friday

## 2015-05-25 NOTE — Telephone Encounter (Signed)
Patient daughter called to clarify information given to patient this am. Patient told daughter she had just taken a pain pill this am and couldn't remember what Dr Raoul Pitch had told her. Reviewed results and information about future testing with patient daughter.

## 2015-05-25 NOTE — Telephone Encounter (Signed)
FYI

## 2015-05-25 NOTE — Telephone Encounter (Signed)
Pt was seen by Dr. Adelina Mings on 05/24/15 for symptoms below.  Pt is scheduled to follow up with Dr. Charlett Blake on 05/31/15.

## 2015-05-26 ENCOUNTER — Ambulatory Visit (HOSPITAL_BASED_OUTPATIENT_CLINIC_OR_DEPARTMENT_OTHER)
Admission: RE | Admit: 2015-05-26 | Discharge: 2015-05-26 | Disposition: A | Discharge status: Home / Self Care | Payer: Medicare Other | Source: Ambulatory Visit | Attending: Family Medicine | Admitting: Family Medicine

## 2015-05-26 ENCOUNTER — Other Ambulatory Visit (INDEPENDENT_AMBULATORY_CARE_PROVIDER_SITE_OTHER): Payer: Medicare Other

## 2015-05-26 ENCOUNTER — Encounter (HOSPITAL_BASED_OUTPATIENT_CLINIC_OR_DEPARTMENT_OTHER): Payer: Self-pay

## 2015-05-26 ENCOUNTER — Telehealth: Payer: Self-pay | Admitting: Family Medicine

## 2015-05-26 DIAGNOSIS — S8011XA Contusion of right lower leg, initial encounter: Secondary | ICD-10-CM | POA: Diagnosis not present

## 2015-05-26 DIAGNOSIS — R0600 Dyspnea, unspecified: Secondary | ICD-10-CM | POA: Diagnosis not present

## 2015-05-26 DIAGNOSIS — R079 Chest pain, unspecified: Secondary | ICD-10-CM

## 2015-05-26 DIAGNOSIS — Z96651 Presence of right artificial knee joint: Secondary | ICD-10-CM | POA: Diagnosis not present

## 2015-05-26 DIAGNOSIS — S8012XA Contusion of left lower leg, initial encounter: Secondary | ICD-10-CM | POA: Diagnosis not present

## 2015-05-26 DIAGNOSIS — R0789 Other chest pain: Secondary | ICD-10-CM

## 2015-05-26 DIAGNOSIS — W19XXXA Unspecified fall, initial encounter: Secondary | ICD-10-CM | POA: Diagnosis not present

## 2015-05-26 DIAGNOSIS — Z96653 Presence of artificial knee joint, bilateral: Secondary | ICD-10-CM | POA: Diagnosis not present

## 2015-05-26 DIAGNOSIS — Z471 Aftercare following joint replacement surgery: Secondary | ICD-10-CM | POA: Diagnosis not present

## 2015-05-26 DIAGNOSIS — Z96652 Presence of left artificial knee joint: Secondary | ICD-10-CM | POA: Diagnosis not present

## 2015-05-26 LAB — BASIC METABOLIC PANEL
BUN: 15 mg/dL (ref 6–23)
CO2: 29 mEq/L (ref 19–32)
CREATININE: 0.93 mg/dL (ref 0.40–1.20)
Calcium: 10 mg/dL (ref 8.4–10.5)
Chloride: 103 mEq/L (ref 96–112)
GFR: 63.17 mL/min (ref >60.00)
Glucose, Bld: 106 mg/dL — ABNORMAL HIGH (ref 70–99)
POTASSIUM: 4.4 meq/L (ref 3.5–5.1)
Sodium: 139 mEq/L (ref 135–145)

## 2015-05-26 MED ORDER — BACLOFEN 10 MG PO TABS: 10.0000 mg | ORAL_TABLET | Freq: Three times a day (TID) | ORAL | Status: DC

## 2015-05-26 MED ORDER — IOPAMIDOL (ISOVUE-300) INJECTION 61%
100.0000 mL | Freq: Once | INTRAVENOUS | Status: AC | PRN
  Administered 2015-05-26: 80 mL via INTRAVENOUS

## 2015-05-26 NOTE — Telephone Encounter (Signed)
Please call pt: - Her CT scan is normal. Nothing fractured or cause of her pain identified.  - I have called in baclofen (this is a muscle relaxer and may cause mild sedation). However, if her pain is more muscle skeletal this may help her recover quickly.

## 2015-05-26 NOTE — Telephone Encounter (Signed)
Left message on patient voice mail with results and instructions. 

## 2015-05-28 ENCOUNTER — Encounter: Payer: Self-pay | Admitting: Family Medicine

## 2015-05-28 ENCOUNTER — Ambulatory Visit (INDEPENDENT_AMBULATORY_CARE_PROVIDER_SITE_OTHER): Payer: Medicare Other | Admitting: Family Medicine

## 2015-05-28 VITALS — BP 124/66 | HR 78 | Temp 98.1°F | Resp 20 | Wt 202.0 lb

## 2015-05-28 DIAGNOSIS — R079 Chest pain, unspecified: Secondary | ICD-10-CM

## 2015-05-28 DIAGNOSIS — R0789 Other chest pain: Secondary | ICD-10-CM

## 2015-05-28 MED ORDER — BACLOFEN 10 MG PO TABS: 10.0000 mg | ORAL_TABLET | Freq: Three times a day (TID) | ORAL | Status: DC

## 2015-05-28 NOTE — Patient Instructions (Signed)
Baclofen refill in the event you you need them. Otherwise attempt to wean off pain killers and muscle relaxer.

## 2015-05-28 NOTE — Progress Notes (Signed)
Patient ID: Shannon [name], female   DOB: 06-Jul-1945, 70 y.o.   MRN: [id number]    Shannon [name] , 09-05-1945, 70 y.o., female MRN: [id number]  CC: Sternal pain  Subjective: Patient reports improvement in her pain level. She is taking the [facility] infrequently now and baclofen, which she feels have helped greatly. She has followed with her orthopedic, concerning her bilateral knee replacement with such a hard impact. LE doppler, CXR and CT chest normal. ENT/ophthalmology are not planning surgery of or her blow out fracture.   - all images reviewed with pt today.   Ct Chest W Contrast  05/26/2015  CLINICAL DATA:  Fall on 05/17/2015, injured right sided chest. Sternal pain, shortness of breath. Initial encounter. EXAM: CT CHEST WITH CONTRAST TECHNIQUE: Multidetector CT imaging of the chest was performed during intravenous contrast administration. CONTRAST:  48m ISOVUE-300 IOPAMIDOL (ISOVUE-300) INJECTION 61% COMPARISON:  11/03/2013. FINDINGS: Mediastinum/Lymph Nodes: No pathologically enlarged mediastinal, hilar or axillary lymph nodes. Heart size normal. Pre pericardiac lymph nodes are sub cm in short axis size. No pericardial effusion. Lungs/Pleura: Mild subpleural radiation fibrosis in the anterior right hemi thorax. 7 mm peripheral right lower lobe nodule (series 3, image 68), unchanged. Volume loss in the medial aspect of the right middle lobe. No pleural fluid. Airway is unremarkable. Upper abdomen: Visualized portions of the liver, adrenal glands, kidneys, spleen, pancreas, stomach and bowel are grossly unremarkable. Cholecystectomy. No upper abdominal adenopathy. Musculoskeletal: No worrisome lytic or sclerotic lesions. No acute fracture. T4 superior endplate compression fracture, unchanged. IMPRESSION: No fracture.  No findings to explain the patient's pain. Electronically Signed   By: MLorin PicketM.D.   On: 05/26/2015 14:44    EKG at ED 05/17/2015: Date/Time: Monday May 17 2015 12:25:27  EDT Ventricular Rate: 65 PR Interval: 141 QRS Duration: 76 QT Interval: 393 QTC Calculation: 409 R Axis: -15 Text Interpretation: Sinus rhythm Inferior infarct, old No significant  change since last tracing Confirmed by BEATON MD, ROBERT [phone] on    05/17/2015 12:47:35 PM  Dg Ribs Unilateral W/chest Right  05/17/2015  CLINICAL DATA:  Fall.  Anterior right-sided rib pain. EXAM: RIGHT RIBS AND CHEST - 3+ VIEW COMPARISON:  CT of the chest 11/03/2013. FINDINGS: Heart size is normal. The lung volumes are low. The lungs are clear. There is no edema or effusion to suggest failure. The right-sided ribs are intact. No acute or healing fracture is present. Surgical clips are present at the gallbladder fossa. Degenerative changes are present in the thoracic spine. IMPRESSION: 1. No evidence for acute or healing right-sided rib fracture. 2. Low lung volumes. 3. Cholecystectomy. Electronically Signed   By: CSan MorelleM.D.   On: 05/17/2015 12:50   Ct Head Wo Contrast  05/17/2015  CLINICAL DATA:  Fall in garage, now with headache EXAM: CT HEAD WITHOUT CONTRAST TECHNIQUE: Contiguous axial images were obtained from the base of the skull through the vertex without intravenous contrast. COMPARISON:  02/11/2012; brain MRI - 10/08/2012 FINDINGS: There is a large amount of subcutaneous emphysema about the caudal aspect of the right orbit and globe (image 2, series 3). This finding is associated with apparent hematocrit level within in the right maxillary sinus (image 1, series 3). Otherwise, normal noncontrast appearance of the right orbit and globe. No definitive retro bulbar hematoma. The right-sided lamina papyracea appears preserved. Remaining paranasal sinuses and mastoid air cells are normally aerated. Post right frontal craniotomy. Bilateral basal ganglial calcifications. Gray-white differentiation is maintained. No CT evidence acute large  territory infarct. No intraparenchymal or extra-axial  mass or hemorrhage. Normal size and configuration of the ventricles and basilar cisterns. No midline shift. IMPRESSION: 1. Large amount of subcutaneous emphysema about the caudal aspect of the right orbit and globe with associated hematocrit level within the right maxillary sinus - constellation of findings are worrisome for a facial fracture involving (at least) the anterior wall of the maxillary sinus (potentially an orbital blowout fracture), not imaged on the present examination. Further evaluation with dedicated maxillofacial CT is recommended. 2. No acute intracranial process. 3. Sequela of remote right-sided frontal craniotomy. Critical Value/emergent results were called by telephone at the time of interpretation on 05/17/2015 at 12:44 pm to Dr. Leonard Schwartz , who verbally acknowledged these results. Electronically Signed   By: Sandi Mariscal M.D.   On: 05/17/2015 12:47   Ct Maxillofacial Wo Cm  05/17/2015  CLINICAL DATA:  Facial and orbital trauma secondary to a fall in his garage today. EXAM: CT MAXILLOFACIAL WITHOUT CONTRAST TECHNIQUE: Multidetector CT imaging of the maxillofacial structures was performed. Multiplanar CT image reconstructions were also generated. A small metallic BB was placed on the right temple in order to reliably differentiate right from left. COMPARISON:  CT scan of the head dated 02/11/2012 FINDINGS: There is a slightly depressed blowout fracture of the floor of the low right orbit. The fractured area measures 10 x 19 mm. The inferior rectus muscle is subluxed into the fracture and is impinged upon by the edge of the broken bone of the orbital floor. There is blood in the right maxillary sinus. There is air in the soft tissues just above the inferior orbital rim. No other facial fractures. Moderate arthritis of the temporomandibular joints, right worse than left. Previous right frontotemporal craniotomy. IMPRESSION: Acute blowout fracture of the floor of the right orbit with partial  entrapment of the inferior rectus muscle. Electronically Signed   By: Lorriane Shire M.D.   On: 05/17/2015 14:20  Left leg swoolemn No blood thinner. Hurts wihr deep ispiration.   Allergies  Allergen Reactions  . Dilaudid [Hydromorphone Hcl] Itching   Social History  Substance Use Topics  . Smoking status: Never Smoker   . Smokeless tobacco: Never Used  . Alcohol Use: No   Past Medical History  Diagnosis Date  . NICM (nonischemic cardiomyopathy) ([place]) 03/08/2010    likely 2/2 chemotx - a. Echo 2012: EF 45-50%;  b. Lex MV 2/12:  low risk, apical defect (small area of ischemia vs shifting breast atten);  c.  Echo 7/12: Normal wall thickness, EF 60-65%, normal wall motion, grade 1 diastolic dysfunction, mild LAE, PASP 32;   d. Lex MV 11/13:  EF 76%, no ischemia  . GERD 08/11/2008  . HYPERLIPIDEMIA 08/11/2008  . HYPERTENSION 08/11/2008  . LIPOMA 02/10/2009  . PREDIABETES 06/15/2009  . 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 Carpal tunnel syndrome of left wrist   . Neuropathy ([facility])   . Interstitial cystitis   . Brain tumor ([place])   . DEPRESSION 08/11/2008  . 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 Osteoporosis   . Hyponatremia 08/28/2011  . Anemia 08/28/2011  . History of chicken pox   . H/O measles   . H/O mumps   . Dermatitis   . OA (osteoarthritis) 09/07/2011  . Shortness of breath   . Asthma   . History of blood transfusion   .  UTI (lower urinary tract infection) 03/03/2012  . Constipation 03/03/2012  . 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 Obesity 09/28/2014  . Medicare annual wellness visit, subsequent 12/27/2014    Follows with Dr Amalia Hailey of urology Follows with Dr Posey Pronto at [facility] for opthamology Follows with Dr [name] of dermatology Follows with [facility] gastroenterology, last scope done in 2011 repeat in 10 years Last [facility] July 2015 should repeat every 1-2 years Last Pap December 2015, repeat in 3 years.     . Melanoma ([place]) 2008    DR. Ronnald Ramp  . Breast cancer ([place])   . Facial fracture [facility])    Past Surgical History  Procedure Laterality Date  . Cholecystectomy    . Carpal tunnel release      L wrist, ulnar nerve moved  . Elbow surgery      left  . Tubal ligation  1997  . Lipoma excision  03/28/2009    right leg  . Portacath placement  may 2011  . Craniotomy for tumor  2000  . Tonsillectomy  1958  . Melanoma excision    . [place] cath    . Port-a-cath removal  11/30/2010    [place]  . Total knee raplacement  01-2012    Right Knee  . Total knee arthroplasty  02/05/2012    Procedure: TOTAL KNEE ARTHROPLASTY;  Surgeon: Gearlean Alf, MD;  Location: [facility];  Service: Orthopedics;  Laterality: Right;  . Total knee arthroplasty Left 02/10/2013    Procedure: LEFT TOTAL KNEE ARTHROPLASTY;  Surgeon: Gearlean Alf, MD;  Location: [facility];  Service: Orthopedics;  Laterality: Left;  . Left heart catheterization with coronary angiogram N/A 12/16/2013    Procedure: LEFT HEART CATHETERIZATION WITH CORONARY ANGIOGRAM;  Surgeon: Peter M [name], MD;  Location: [facility] CATH LAB;  Service: Cardiovascular;  Laterality: N/A;  . Breast lumpectomy  04/2009    RIGHT FOR BREAST CANCER-CHEMO/RADIATION X 1 YEAR  . Breast reduction surgery Bilateral 05/04/2014    Procedure: MAMMARY REDUCTION  (BREAST);  Surgeon: Cristine Polio, MD;  Location: [place];  Service: Plastics;  Laterality: Bilateral;  . Knee arthroscopy Left 10/12/2014    Procedure: LEFT KNEE ARTHROSCOPY ;  Surgeon: Gaynelle Arabian, MD;  Location: [facility];  Service: Orthopedics;  Laterality: Left;  . Synovectomy Left 10/12/2014    Procedure: WITH SYNOVECTOMY;  Surgeon: Gaynelle Arabian, MD;  Location: [facility];  Service: Orthopedics;  Laterality: Left;   Family History  Problem Relation Age of Onset  . Breast cancer Mother   . Lung cancer Mother   . Breast cancer Mother     sarcoma  . Hypertension Mother   . Colon cancer Neg Hx   . Prostate  cancer Father   . Congestive Heart Failure Father   . Prostate cancer Father   . Prostate cancer Brother   . Prostate cancer Brother   . Stomach cancer Maternal Aunt   . Uterine cancer Maternal Aunt   . Down syndrome Son   . Heart disease Maternal Grandfather     MI  . Heart attack Father      Medication List       This list is accurate as of: 05/28/15  2:10 PM.  Always use your most recent med list.               acetaminophen 500 MG tablet  Commonly known as:  TYLENOL  Take 1,000 mg by mouth every 6 (six) hours as needed for moderate pain  or headache.     alendronate 70 MG tablet  Commonly known as:  FOSAMAX  Take 1 tablet (70 mg total) by mouth every 7 (seven) days. Take with a full glass of water on an empty stomach.     aspirin EC 81 MG tablet  Take 81 mg by mouth daily.     baclofen 10 MG tablet  Commonly known as:  LIORESAL  Take 1 tablet (10 mg total) by mouth 3 (three) times daily.     carvedilol 12.5 MG tablet  Commonly known as:  COREG  Take 1 tablet (12.5 mg total) by mouth 2 (two) times daily with a meal.     Cholecalciferol 2000 units Caps  Take 2,000 Units by mouth daily.     clonazePAM 1 MG tablet  Commonly known as:  KLONOPIN  TAKE 1/2 TABLET BY MOUTH AT BEDTIME.     CRESTOR 20 MG tablet  Generic drug:  rosuvastatin  TAKE 1 TABLET (20 MG TOTAL) BY MOUTH AT BEDTIME.     FERROCITE 324 (106 Fe) MG Tabs tablet  Generic drug:  Ferrous Fumarate  TAKE 1 TABLET (106 MG OF IRON TOTAL) BY MOUTH DAILY.     folic acid 1 MG tablet  Commonly known as:  FOLVITE  TAKE 1 TABLET BY MOUTH EVERY DAY     gabapentin 300 MG capsule  Commonly known as:  NEURONTIN  Take 300 mg by mouth 2 (two) times daily. Reported on 05/24/2015     HYDROcodone-acetaminophen 5-325 MG tablet  Commonly known as:  NORCO/VICODIN  Take 2 tablets by mouth every 6 (six) hours as needed.     hydrOXYzine 25 MG tablet  Commonly known as:  ATARAX/VISTARIL  Take 25 mg by mouth at  bedtime.     pentosan polysulfate 100 MG capsule  Commonly known as:  ELMIRON  Take 200 mg by mouth 2 (two) times daily.     RA KRILL OIL 500 MG Caps  Take 1 tablet by mouth daily.     ranitidine 150 MG tablet  Commonly known as:  ZANTAC  Take 1 tablet (150 mg total) by mouth 2 (two) times daily as needed for heartburn.     SUPER B COMPLEX PO  Take 1 tablet by mouth daily.     telmisartan 80 MG tablet  Commonly known as:  MICARDIS  TAKE 1 TABLET (80 MG TOTAL) BY MOUTH DAILY.     venlafaxine XR 150 MG 24 hr capsule  Commonly known as:  EFFEXOR-XR  TAKE ONE CAPSULE BY MOUTH EVERY DAY        ROS: Negative, with the exception of above mentioned in HPI  Objective:  BP 124/66 mmHg  Pulse 78  Temp(Src) 98.1 F (36.7 C) (Oral)  Resp 20  Wt 202 lb (91.627 kg)  SpO2 99%  LMP 01/30/1994 Body mass index is 34.66 kg/(m^2). Gen: Afebrile. No acute distress. Nontoxic in appearance well developed well nourished female pt.  HENT: [place]. Bruising left orbital area, left nasolabial fold. MMM, no oral lesions.  Eyes:Pupils Equal Round Reactive to light, Extraocular movements intact,  Conjunctiva without redness, discharge or icterus. CV: RRR Chest: CTAB, no wheeze or crackles. Good air movement, normal resp effort.  MSK: moderate to severe soft tissue swelling with bruising left lateral shin/calf. Neg homans. Equal pulses bilaterally LE. Good cap refill distally.  Skin: No rashes, purpura or petechiae. Bruising (yellow-green) right breast. Bruising bilateral shins extended to left foot. Multiple healing small lacerations with mid erythema  left shin.  Neuro: mild limp. PERLA. EOMi. Alert. Oriented x3  Psych: Normal affect, dress and demeanor. Normal speech. Normal thought content and judgment.  Assessment/Plan: GUNEET DELPINO is a 70 y.o. female present for follow up on sternal pain/ shortness of breath after trauma.  Left leg swelling/shortness of breath: - She has followed with her Ortho  and reports her bilateral knee hardware is intact.  - US duplex of LE was normal with superficial hematoma of left calf only. Shortness of breath has also resolved.    Sternal pain/ shortness of breath - Improved pain in sternum after muscle relaxant prescribed.  - Pt was taking norco, and has started to taper off med.   - CXR was normal. On call back pt was worsening, proceeded with CT which did not show any acute reasons for her pain .  -  Refills on baclofen today in the event she needs them. Pt to start tapering off norco and use baclofen as needed only.  - Discussed it will take time to recover form this trauma. - F/U PCP as needed   electronically signed by:  Howard Pouch, DO  [place]

## 2015-05-31 ENCOUNTER — Ambulatory Visit: Payer: Medicare Other | Admitting: Family Medicine

## 2015-06-01 ENCOUNTER — Ambulatory Visit (HOSPITAL_BASED_OUTPATIENT_CLINIC_OR_DEPARTMENT_OTHER): Payer: Medicare Other | Admitting: Nurse Practitioner

## 2015-06-01 ENCOUNTER — Telehealth: Payer: Self-pay | Admitting: Nurse Practitioner

## 2015-06-01 ENCOUNTER — Encounter: Payer: Self-pay | Admitting: Nurse Practitioner

## 2015-06-01 VITALS — BP 143/77 | HR 89 | Temp 98.2°F | Resp 18 | Ht 64.0 in | Wt 201.3 lb

## 2015-06-01 DIAGNOSIS — C50311 Malignant neoplasm of lower-inner quadrant of right female breast: Secondary | ICD-10-CM

## 2015-06-01 DIAGNOSIS — Z853 Personal history of malignant neoplasm of breast: Secondary | ICD-10-CM | POA: Diagnosis not present

## 2015-06-01 NOTE — Progress Notes (Signed)
 CLINIC:  Cancer Survivorship   REASON FOR VISIT:  Routine follow-up post-treatment for history of breast cancer.  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)   04/2009 Initial Biopsy Right breast core needle bx: IDC, ER/PR-, HER2/neu positive, Ki67 58%   04/2009 Clinical Stage Stage IA: T1c No    - 08/2009 Neo-Adjuvant Chemotherapy Taxol/ carbo/trastuzumab x 2, then Taxol/trastuzumab weekly x 3 (with last cycle with weekly carbo), A/C x 2 cycles with doxorubicin by continuous infusion (chemo poorly tolerated)   09/2009 Definitive Surgery Right lumpectomy/SLNB: complete pathologic response    - 11/2009 Radiation Therapy Adjuvant RT: right breast   09/2009 - 01/2010 Chemotherapy Maintenance trastuzumab; discontinued due to drop in EF, which has since respolved    INTERVAL HISTORY:  Shannon [name] presents to the Survivorship Clinic today for ongoing follow up regarding her history of breast cancer. Overall, Shannon [name] reports feeling doing well since her last visit in October 2016 aside from her recent fall.  Approximately 2 1/2 weeks ago, Shannon [name] fell down ~ 4 stairs and suffered an orbital blow out fracture. She is being followed by ENT who has recommended no surgical intervention. She saw her primary care provider last week with complaints of sternal pain and left leg swelling accompanied by dyspnea. CT scan of the chest and Doppler were negative for evidence of blood clot. She was placed on pain medications which she states are helping, however, she has significant side effects from the narcotics and is instead taking her Tylenol.  Shannon [name] was seen by her orthopedic doctor to ensure that her hardware from her bilateral total knee replacements are intact, and they are. She has not noticed any change within her breast and her last mammogram was in April 2017 and unremarkable. She denies any headache or cough.. She reports that her appetitive has been  decreased over the last 2 weeks since the fall and she has lost some weight.  She denies feeling poorly other than occasional pain from her fall. She continues with some anxiety related to the health of her adult son who has Down's Syndrome with middle stage dementia.  Her husband also had a major heart attack, but is recovering.  REVIEW OF SYSTEMS:  General: Decreased appetite as above.  Denies fever, chills,or generalized fatigue.  HEENT: Wears glasses. Denies visual changes, hearing loss, mouth sores, or difficulty swallowing. Cardiac: Denies palpitations and lower extremity edema.  Respiratory: Improving shortness of breath due to chest pain secondary to fall.  Otherwise, denies wheeze or dyspnea on exertion.  Breast: Denies any new nodularity, masses, tenderness, nipple changes, or nipple discharge.  GI: Denies abdominal pain, constipation, diarrhea, nausea, dark stools, or vomiting.  GU: Denies dysuria, hematuria, vaginal bleeding, vaginal discharge, or vaginal dryness.  Musculoskeletal: As above.  Also with continued arthritis pain, which is stable.  Neuro: Denies numbness / tingling in her extremities.  Skin: Denies rash, pruritis, or open wounds.  Psych: Some difficulty sleeping due to pain from fall.  Anxiety, stable, related to the care of her son.  Denies depression, insomnia, or memory loss.   A 14-point review of systems was completed and was negative, except as noted above.   ONCOLOGY TREATMENT TEAM:  1. Surgeon:  Dr. Streck (retired) 2. Medical Oncologist: Dr. Magrinat 3. Radiation Oncologist: Dr. Kinard    PAST MEDICAL/SURGICAL HISTORY:  Past Medical History  Diagnosis Date  . NICM (nonischemic cardiomyopathy) (HCC) 03/08/2010      likely 2/2 chemotx - a. Echo 2012: EF 45-50%;  b. Lex MV 2/12:  low risk, apical defect (small area of ischemia vs shifting breast atten);  c.  Echo 7/12: Normal wall thickness, EF 60-65%, normal wall motion, grade 1 diastolic dysfunction, mild  LAE, PASP 32;   d. Lex MV 11/13:  EF 76%, no ischemia  . GERD 08/11/2008  . HYPERLIPIDEMIA 08/11/2008  . HYPERTENSION 08/11/2008  . LIPOMA 02/10/2009  . PREDIABETES 06/15/2009  . 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 Carpal tunnel syndrome of left wrist   . Neuropathy (HCC)   . Interstitial cystitis   . Brain tumor (HCC)   . DEPRESSION 08/11/2008  . 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 Osteoporosis   . Hyponatremia 08/28/2011  . Anemia 08/28/2011  . History of chicken pox   . H/O measles   . H/O mumps   . Dermatitis   . OA (osteoarthritis) 09/07/2011  . Shortness of breath   . Asthma   . History of blood transfusion   . UTI (lower urinary tract infection) 03/03/2012  . Constipation 03/03/2012  . 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 Obesity 09/28/2014  . Medicare annual wellness visit, subsequent 12/27/2014    Follows with Dr Evans of urology Follows with Dr Patel at [facility] for opthamology Follows with Dr Jordan of dermatology Follows with [facility] gastroenterology, last scope done in 2011 repeat in 10 years Last MGM July 2015 should repeat every 1-2 years Last Pap December 2015, repeat in 3 years.   . Melanoma (HCC) 2008    DR. JONES  . Breast cancer (HCC)   . Facial fracture (HCC)    Past Surgical History  Procedure Laterality Date  . Cholecystectomy    . Carpal tunnel release      L wrist, ulnar nerve moved  . Elbow surgery      left  . Tubal ligation  1997  . Lipoma excision  03/28/2009    right leg  . Portacath placement  may 2011  . Craniotomy for tumor  2000  . Tonsillectomy  1958  . Melanoma excision    . Porta cath    . Port-a-cath removal  11/30/2010    Streck  . Total knee raplacement  01-2012    Right Knee  . Total knee arthroplasty  02/05/2012    Procedure: TOTAL KNEE ARTHROPLASTY;  Surgeon: Frank V Aluisio, MD;  Location: [facility];  Service: Orthopedics;  Laterality: Right;  . Total  knee arthroplasty Left 02/10/2013    Procedure: LEFT TOTAL KNEE ARTHROPLASTY;  Surgeon: Frank V Aluisio, MD;  Location: [facility];  Service: Orthopedics;  Laterality: Left;  . Left heart catheterization with coronary angiogram N/A 12/16/2013    Procedure: LEFT HEART CATHETERIZATION WITH CORONARY ANGIOGRAM;  Surgeon: Peter M Jordan, MD;  Location: [facility];  Service: Cardiovascular;  Laterality: N/A;  . Breast lumpectomy  04/2009    RIGHT FOR BREAST CANCER-CHEMO/RADIATION X 1 YEAR  . Breast reduction surgery Bilateral 05/04/2014    Procedure: MAMMARY REDUCTION  (BREAST);  Surgeon: Gerald Truesdale, MD;  Location: [facility];  Service: Plastics;  Laterality: Bilateral;  . Knee arthroscopy Left 10/12/2014    Procedure: LEFT KNEE ARTHROSCOPY ;  Surgeon: Frank Aluisio, MD;  Location: [facility];  Service: Orthopedics;  Laterality: Left;  . Synovectomy Left 10/12/2014      Procedure: WITH SYNOVECTOMY;  Surgeon: Frank Aluisio, MD;  Location: [facility];  Service: Orthopedics;  Laterality: Left;     ALLERGIES:  Allergies  Allergen Reactions  . Dilaudid [Hydromorphone Hcl] Itching     CURRENT MEDICATIONS:  Current Outpatient Prescriptions on File Prior to Visit  Medication Sig Dispense Refill  . acetaminophen (TYLENOL) 500 MG tablet Take 1,000 mg by mouth every 6 (six) hours as needed for moderate pain or headache.    . aspirin EC 81 MG tablet Take 81 mg by mouth daily.    . B Complex-C (SUPER B COMPLEX PO) Take 1 tablet by mouth daily.    . baclofen (LIORESAL) 10 MG tablet Take 1 tablet (10 mg total) by mouth 3 (three) times daily. 30 each 0  . carvedilol (COREG) 12.5 MG tablet Take 1 tablet (12.5 mg total) by mouth 2 (two) times daily with a meal. 180 tablet 3  . Cholecalciferol 2000 UNITS CAPS Take 2,000 Units by mouth daily.    . clonazePAM (KLONOPIN) 1 MG tablet TAKE 1/2 TABLET BY MOUTH AT BEDTIME. 30 tablet 0  . CRESTOR 20 MG tablet TAKE 1 TABLET (20 MG TOTAL) BY MOUTH AT BEDTIME. 30  tablet 5  . FERROCITE 324 MG TABS tablet TAKE 1 TABLET (106 MG OF IRON TOTAL) BY MOUTH DAILY. 30 tablet 4  . folic acid (FOLVITE) 1 MG tablet TAKE 1 TABLET BY MOUTH EVERY DAY 30 tablet 5  . gabapentin (NEURONTIN) 300 MG capsule Take 300 mg by mouth 2 (two) times daily. Reported on 05/24/2015    . hydrOXYzine (ATARAX/VISTARIL) 25 MG tablet Take 25 mg by mouth at bedtime.    . pentosan polysulfate (ELMIRON) 100 MG capsule Take 200 mg by mouth 2 (two) times daily.    . RA KRILL OIL 500 MG CAPS Take 1 tablet by mouth daily.    . ranitidine (ZANTAC) 150 MG tablet Take 1 tablet (150 mg total) by mouth 2 (two) times daily as needed for heartburn. 60 tablet 6  . telmisartan (MICARDIS) 80 MG tablet TAKE 1 TABLET (80 MG TOTAL) BY MOUTH DAILY. (Patient taking differently: Take 40 mg by mouth daily. ) 30 tablet 5  . venlafaxine XR (EFFEXOR-XR) 150 MG 24 hr capsule TAKE ONE CAPSULE BY MOUTH EVERY DAY 30 capsule 5  . alendronate (FOSAMAX) 70 MG tablet Take 1 tablet (70 mg total) by mouth every 7 (seven) days. Take with a full glass of water on an empty stomach. (Patient not taking: Reported on 06/01/2015) 4 tablet 12  . HYDROcodone-acetaminophen (NORCO/VICODIN) 5-325 MG tablet Take 2 tablets by mouth every 6 (six) hours as needed. (Patient not taking: Reported on 06/01/2015) 30 tablet 0   No current facility-administered medications on file prior to visit.     ONCOLOGIC FAMILY HISTORY:  Family History  Problem Relation Age of Onset  . Breast cancer Mother   . Lung cancer Mother   . Breast cancer Mother     sarcoma  . Hypertension Mother   . Colon cancer Neg Hx   . Prostate cancer Father   . Congestive Heart Failure Father   . Prostate cancer Father   . Prostate cancer Brother   . Prostate cancer Brother   . Stomach cancer Maternal Aunt   . Uterine cancer Maternal Aunt   . Down syndrome Son   . Heart disease Maternal Grandfather     MI  . Heart attack Father      GENETIC COUNSELING/TESTING: No       SOCIAL HISTORY:  Shannon [name] is married and lives with her spouse in [place], [place].  She has 3 children. 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  She denies any current or history of tobacco, alcohol, or illicit drug use.    HEALTH MAINTENANCE: Colonoscopy: 2011 Bone density: 2016  PHYSICAL EXAMINATION:  Vital Signs: Filed Vitals:   06/01/15 1203  BP: 143/77  Pulse: 89  Temp: 98.2 F (36.8 C)  Resp: 18   Weight 201.3 (down 11 pounds since October 2016) ECOG performance status: 1 General: Well-nourished, well-appearing female in no acute distress.  She is unaccompanied in clinic today.   HEENT: Head is atraumatic and normocephalic.  Pupils equal and reactive to light and accomodation. Conjunctivae clear without exudate.  Sclerae anicteric. Oral mucosa is pink, moist, and intact without lesions.  Oropharynx is pink without lesions or erythema.  Lymph: No cervical, supraclavicular, infraclavicular, or axillary lymphadenopathy noted on palpation.  Cardiovascular: Regular rate and rhythm without murmurs, rubs, or gallops. Respiratory: Clear to auscultation bilaterally. Chest expansion symmetric without accessory muscle use on inspiration or expiration.  Breast: Bilateral breast exam performed.  Bilateral reduction mammoplasty scars intact and well healed.  Small area of nodularity along right breast consistent with area of fat necrosis, otherwise, no mass or lesion bilaterally. GI: Abdomen soft and round. No tenderness to palpation. Bowel sounds normoactive in 4 quadrants. No hepatosplenomegaly.   GU: Deferred.  Musculoskeletal: Muscle strength 5/5 in all extremities.   Neuro: No focal deficits. Steady gait.  Psych: Mood and affect normal and appropriate for situation.  Extremities: No edema, cyanosis, or clubbing.  Skin: Warm and dry. No open lesions noted.   LABORATORY DATA:  Recent Results (from the past 2160 hour(s))  Basic  Metabolic Panel (BMET)     Status: Abnormal   Collection Time: 05/26/15 12:03 PM  Result Value Ref Range   Sodium 139 135 - 145 mEq/L   Potassium 4.4 3.5 - 5.1 mEq/L   Chloride 103 96 - 112 mEq/L   CO2 29 19 - 32 mEq/L   Glucose, Bld 106 (H) 70 - 99 mg/dL   BUN 15 6 - 23 mg/dL   Creatinine, Ser 0.93 0.40 - 1.20 mg/dL   Calcium 10.0 8.4 - 10.5 mg/dL   GFR 63.17 >60.00 mL/min    DIAGNOSTIC IMAGING:  Bilateral diagnostic mammogram performed 05/06/2015 scattered areas of fibroglandular density and  results of mammoplasty noted.  Surgical clips in the right breast have been removed. Architectural distortion along the posterior lateral and lower medial aspect of the right breast noted consistent with surgical scarring. No suspicious calcifications. Return follow up in one year.   CT scan of the chest on 05/26/2015 reveals no lymphadenopathy.  Mild subpleural radiation fibrosis in the anterior chest and  7 mm peripheral right lower lobe nodule are unchanged. No bony lesions.  Left lower extremity Doppler performed on 05/24/2015 shows no evidence of clot     ASSESSMENT AND PLAN:   1. 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  Shannon [name] is doing well with no clinical symptoms worrisome for cancer recurrence at this time. I have reviewed the recommendations for ongoing surveillance with her and she will follow-up with [place] in the  Survivorship clinic in one year's time with history and physical exam per surveillance protocol. She will be due mammography in April 2018 and orders for this have been entered to be scheduled following today's appointment. She was instructed to make [place] aware if she notes any change within her breast, any new symptoms such as pain, return of shortness of breath, additional weight loss, or fatigue.   2. Weight loss / anorexia: I believe that Shannon [name]' symptoms of anorexia and weight loss are most likely related to her recent injury and should improve as she continues to heal.  She will monitor this and report if it does not improve.    3. Cancer screening:  Due to Shannon [name]'s history and her age, she should receive screening for skin cancers, colon cancer, and gynecologic cancers.  The information and recommendations were shared with the patient and in her written after visit summary.   4. Support services/counseling:  Shannon [name]  was offered support today through active listening and expressive supportive counseling.  She was given information regarding our available services and encouraged to contact me with any questions or for help enrolling in any of our support group/programs.    A total of 30 minutes of face-to-face time was spent with this patient with greater than 50% of that time in counseling and care-coordination.   Sylvan Cheese, NP  Survivorship Program [facility] [phone]   Note: PRIMARY CARE PROVIDER Penni Homans, [place] [phone]

## 2015-06-01 NOTE — Patient Instructions (Signed)
Thank you for coming in today!  As we discussed, please continue to perform your self breast exam and report any changes. If you note any new symptoms (please see below), be sure to notify us ASAP.  Your mammogram will be due in April 2018 and we will enter orders for it today.  We'll have you return in one year's time for your next appointment or sooner if you have any problems. Please be sure to stop by scheduling on your way out to make those appointment(s).  Looking forward to working with you in the future!  Let us know if you have any questions!  Symptoms to Watch for and Report to Your Provider  . Return of the cancer symptoms you had before- such as a lump or new growth where your cancer first started . New or unusual pain that seems unrelated to an injury and does not go away, including back pain or bone pain . Weight loss without trying/intending . Unexplained bleeding . A rash or allergic reaction, such as swelling, severe itching or wheezing . Chills or fevers . Persistent headaches . Shortness of breath or difficulty breathing . Bloody stools or blood in your urine . Lumps, bumps, swelling and/or nipple discharge . Nausea, vomiting, diarrhea, loss of appetite, or trouble swallowing . A cough that doesn't go away . Abdominal pain . Swelling in your arms or legs . Fractures . Hot flashes or other menopausal symptoms . Any other signs mentioned by your doctor or nurse or any unusual symptoms                 that you just can't explain   NOTE: Just because you have certain symptoms, it doesn't mean the cancer has come back or you have a new cancer. Symptoms can be due to other problems that need to be addressed.  It is important to watch for these symptoms and report them to your provider so you can be medically evaluated for any of these concerns!    Living a Life of Wellness After Cancer:  *Note: Please consult your health care provider before using any medications, supplements,  over-the-counter products, or other interventions.  Also, please consult your primary care provider before you begin any lifestyle program (diet, exercise, etc.).  Your safety is our top priority and we want to make sure you continue to live a long and healthy life!    Healthy Lifestyle Recommendations  As a cancer survivor, it is important develop a lifelong commitment to a healthy lifestyle. A healthy lifestyle can prevent cancer from returning as well as prevent other diseases like heart disease, diabetes and high blood pressure.  These are some things that you can do to have a healthy lifestyle:  Marland Kitchen Maintain a healthy weight.  . Exercise daily per your doctor's orders. . Eat a balanced diet high in fruits, vegetables, bran, and fiber. Limit intake of red meat      and processed foods.  . Limit how much alcohol you consume, if at all. Ali Lowe regular bone mineral density testing for osteoporosis.  . Talk to your doctor about cardiovascular disease or "heart disease" screening. . Stop smoking (if you smoke). . Know your family history. . Be mindful of your emotional, social, and spiritual needs. . Meet regularly with a Primary Care Provider (PCP). Find a PCP if you do not             already have one. . Talk to your doctor about  regular cancer screening including screening for colon           cancer, GYN cancers, and skin cancer.

## 2015-06-01 NOTE — Telephone Encounter (Signed)
Gave and printed appt sched and avs for pt for may 2018

## 2015-06-07 ENCOUNTER — Ambulatory Visit: Payer: Medicare Other | Admitting: Family Medicine

## 2015-06-14 ENCOUNTER — Ambulatory Visit (INDEPENDENT_AMBULATORY_CARE_PROVIDER_SITE_OTHER): Payer: Medicare Other | Admitting: Family Medicine

## 2015-06-14 ENCOUNTER — Encounter: Payer: Self-pay | Admitting: Family Medicine

## 2015-06-14 VITALS — BP 129/72 | HR 86 | Temp 98.0°F | Resp 20 | Wt 204.8 lb

## 2015-06-14 DIAGNOSIS — M7989 Other specified soft tissue disorders: Secondary | ICD-10-CM

## 2015-06-14 NOTE — Patient Instructions (Signed)
We will get a CT of your lower leg to get a better diagnosis.

## 2015-06-14 NOTE — Progress Notes (Signed)
Patient ID: Shannon [name], female   DOB: 09/21/1945, 70 y.o.   MRN: [id number]    Shannon [name] , April 28, 1945, 70 y.o., female MRN: [id number]  CC: LLE swelling Subjective: Pt presents for acute OV for continued LLE/swelling after trauma 4 weeks ago. Pt had a high impact fall that caused sternal bruising and orbital blow out fracture. She also sustained bilateral shin trauma Left> right. She has hada venous US to rule out blood clot (2/2 to SOB) which resulted negative for blood clot (probable superficial hematoma). She was also evaluated by her orthopedic, which xrayed the area of concern in the office, secondary to hardware/total knee and reportedly the x-ray was normal. Patient returns today for continued left lower extremity swelling that has not resolved or improved at all. She states the right lower extremity swelling has completely resolved. She denies any increased pain, fever, chills or increased erythema over the area. She denies any drainage from the area. She states occasionally she feels like there is fluid shift underneath the swelling. She reports bruising has resolved, but occasionally she still is getting swelling down to her ankle and her left lower extremity.  Allergies  Allergen Reactions  . Dilaudid [Hydromorphone Hcl] Itching   Social History  Substance Use Topics  . Smoking status: Never Smoker   . Smokeless tobacco: Never Used  . Alcohol Use: No   Past Medical History  Diagnosis Date  . NICM (nonischemic cardiomyopathy) ([place]) 03/08/2010    likely 2/2 chemotx - a. Echo 2012: EF 45-50%;  b. Lex MV 2/12:  low risk, apical defect (small area of ischemia vs shifting breast atten);  c.  Echo 7/12: Normal wall thickness, EF 60-65%, normal wall motion, grade 1 diastolic dysfunction, mild LAE, PASP 32;   d. Lex MV 11/13:  EF 76%, no ischemia  . GERD 08/11/2008  . HYPERLIPIDEMIA 08/11/2008  . HYPERTENSION 08/11/2008  . LIPOMA 02/10/2009  . PREDIABETES 06/15/2009  . 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 Carpal tunnel syndrome of left wrist   . Neuropathy ([facility])   . Interstitial cystitis   . Brain tumor ([place])   . DEPRESSION 08/11/2008  . 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 Osteoporosis   . Hyponatremia 08/28/2011  . Anemia 08/28/2011  . History of chicken pox   . H/O measles   . H/O mumps   . Dermatitis   . OA (osteoarthritis) 09/07/2011  . Shortness of breath   . Asthma   . History of blood transfusion   . UTI (lower urinary tract infection) 03/03/2012  . Constipation 03/03/2012  . 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 Obesity 09/28/2014  . Medicare annual wellness visit, subsequent 12/27/2014    Follows with Dr Amalia Hailey of urology Follows with Dr Posey Pronto at [facility] for opthamology Follows with Dr [name] of dermatology Follows with [facility]ology, last scope done in 2011 repeat in 10 years Last [facility] July 2015 should repeat every 1-2 years Last Pap December 2015, repeat in 3 years.   . Melanoma ([place]) 2008    DR. Ronnald Ramp  . Breast cancer ([place])   . Facial fracture [facility])    Past Surgical History  Procedure Laterality Date  . Cholecystectomy    . Carpal tunnel release      L wrist, ulnar nerve moved  . Elbow surgery      left  .  Tubal ligation  1997  . Lipoma excision  03/28/2009    right leg  . Portacath placement  may 2011  . Craniotomy for tumor  2000  . Tonsillectomy  1958  . Melanoma excision    . Vina cath    . Port-a-cath removal  11/30/2010    [place]  . Total knee raplacement  01-2012    Right Knee  . Total knee arthroplasty  02/05/2012    Procedure: TOTAL KNEE ARTHROPLASTY;  Surgeon: Gearlean Alf, MD;  Location: [facility];  Service: Orthopedics;  Laterality: Right;  . Total knee arthroplasty Left 02/10/2013    Procedure: LEFT TOTAL KNEE ARTHROPLASTY;  Surgeon: Gearlean Alf, MD;  Location: [facility];  Service: Orthopedics;  Laterality: Left;  . Left heart  catheterization with coronary angiogram N/A 12/16/2013    Procedure: LEFT HEART CATHETERIZATION WITH CORONARY ANGIOGRAM;  Surgeon: Peter M [name], MD;  Location: [facility] CATH LAB;  Service: Cardiovascular;  Laterality: N/A;  . Breast lumpectomy  04/2009    RIGHT FOR BREAST CANCER-CHEMO/RADIATION X 1 YEAR  . Breast reduction surgery Bilateral 05/04/2014    Procedure: MAMMARY REDUCTION  (BREAST);  Surgeon: Cristine Polio, MD;  Location: [facility];  Service: Plastics;  Laterality: Bilateral;  . Knee arthroscopy Left 10/12/2014    Procedure: LEFT KNEE ARTHROSCOPY ;  Surgeon: Gaynelle Arabian, MD;  Location: [facility];  Service: Orthopedics;  Laterality: Left;  . Synovectomy Left 10/12/2014    Procedure: WITH SYNOVECTOMY;  Surgeon: Gaynelle Arabian, MD;  Location: [facility];  Service: Orthopedics;  Laterality: Left;   Family History  Problem Relation Age of Onset  . Breast cancer Mother   . Lung cancer Mother   . Breast cancer Mother     sarcoma  . Hypertension Mother   . Colon cancer Neg Hx   . Prostate cancer Father   . Congestive Heart Failure Father   . Prostate cancer Father   . Prostate cancer Brother   . Prostate cancer Brother   . Stomach cancer Maternal Aunt   . Uterine cancer Maternal Aunt   . Down syndrome Son   . Heart disease Maternal Grandfather     MI  . Heart attack Father      Medication List       This list is accurate as of: 06/14/15  1:21 PM.  Always use your most recent med list.               acetaminophen 500 MG tablet  Commonly known as:  TYLENOL  Take 1,000 mg by mouth every 6 (six) hours as needed for moderate pain or headache.     alendronate 70 MG tablet  Commonly known as:  FOSAMAX  Take 1 tablet (70 mg total) by mouth every 7 (seven) days. Take with a full glass of water on an empty stomach.     aspirin EC 81 MG tablet  Take 81 mg by mouth daily.     baclofen 10 MG tablet  Commonly known as:  LIORESAL  Take 1 tablet (10 mg total) by mouth 3  (three) times daily.     carvedilol 12.5 MG tablet  Commonly known as:  COREG  Take 1 tablet (12.5 mg total) by mouth 2 (two) times daily with a meal.     Cholecalciferol 2000 units Caps  Take 2,000 Units by mouth daily.     clonazePAM 1 MG tablet  Commonly known as:  KLONOPIN  TAKE 1/2 TABLET BY MOUTH  AT BEDTIME.     CRESTOR 20 MG tablet  Generic drug:  rosuvastatin  TAKE 1 TABLET (20 MG TOTAL) BY MOUTH AT BEDTIME.     FERROCITE 324 (106 Fe) MG Tabs tablet  Generic drug:  Ferrous Fumarate  TAKE 1 TABLET (106 MG OF IRON TOTAL) BY MOUTH DAILY.     folic acid 1 MG tablet  Commonly known as:  FOLVITE  TAKE 1 TABLET BY MOUTH EVERY DAY     gabapentin 300 MG capsule  Commonly known as:  NEURONTIN  Take 300 mg by mouth 2 (two) times daily. Reported on 05/24/2015     HYDROcodone-acetaminophen 5-325 MG tablet  Commonly known as:  NORCO/VICODIN  Take 2 tablets by mouth every 6 (six) hours as needed.     hydrOXYzine 25 MG tablet  Commonly known as:  ATARAX/VISTARIL  Take 25 mg by mouth at bedtime.     pentosan polysulfate 100 MG capsule  Commonly known as:  ELMIRON  Take 200 mg by mouth 2 (two) times daily.     RA KRILL OIL 500 MG Caps  Take 1 tablet by mouth daily.     ranitidine 150 MG tablet  Commonly known as:  ZANTAC  Take 1 tablet (150 mg total) by mouth 2 (two) times daily as needed for heartburn.     SUPER B COMPLEX PO  Take 1 tablet by mouth daily.     telmisartan 80 MG tablet  Commonly known as:  MICARDIS  TAKE 1 TABLET (80 MG TOTAL) BY MOUTH DAILY.     venlafaxine XR 150 MG 24 hr capsule  Commonly known as:  EFFEXOR-XR  TAKE ONE CAPSULE BY MOUTH EVERY DAY        ROS: Negative, with the exception of above mentioned in HPI  Objective:  BP 129/72 mmHg  Pulse 86  Temp(Src) 98 F (36.7 C)  Resp 20  Wt 204 lb 12 oz (92.874 kg)  SpO2 96%  LMP 01/30/1994 Body mass index is 35.13 kg/(m^2). Gen: Afebrile. No acute distress. Nontoxic in appearance well  developed well nourished female pt.  HENT: [place]. Bruising left orbital area, left nasolabial fold. MMM, no oral lesions.  Eyes:Pupils Equal Round Reactive to light, Extraocular movements intact,  Conjunctiva without redness, discharge or icterus. CV: RRR Chest: CTAB, no wheeze or crackles. Good air movement, normal resp effort.  MSK: Mild erythema, skin intact. No drainage. moderate to severe soft tissue swelling remains  LLE.  Neg homans. Equal pulses bilaterally LE. Good cap refill distally.  Skin: No rashes, purpura or petechiae. Bruising (yellow-green) right breast. Bruising bilateral shins extended to left foot. Multiple healing small lacerations with mid erythema left shin.  Neuro: PERLA. EOMi. Alert. Oriented x3   Assessment/Plan: MARIDEE SLAPE is a 70 y.o. female present for follow up on sternal pain/ shortness of breath after trauma.  Left leg swelling:  - She has followed with her Ortho and reports her bilateral knee hardware is intact.  - US duplex of LE was normal with superficial hematoma of left calf only. Shortness of breath has also resolved.  - Pt continues to have concerns over knee/shin. Swelling has not resolved or improved over 4 weeks.  - discussed potential hematoma (as [place] indicated) vs fluid collection. Pt would like to proceed with imaging  of leg to decide if she would like to have hematoma/fluid  Removed surgically or by ortho.   - MRI left lower extremity/knee ordered.  - Follow-up dependent upon MRI results  >  25 minutes spent with patient, >50% of time spent face to face counseling patient and coordinating care.   electronically signed by:  Howard Pouch, DO  [place]

## 2015-06-17 ENCOUNTER — Other Ambulatory Visit: Payer: Self-pay | Admitting: Family Medicine

## 2015-06-17 MED ORDER — FOLIC ACID 1 MG PO TABS: 1.0000 mg | ORAL_TABLET | Freq: Every day | ORAL | Status: DC

## 2015-06-17 MED ORDER — VENLAFAXINE HCL ER 150 MG PO CP24: 150.0000 mg | ORAL_CAPSULE | Freq: Every day | ORAL | Status: DC

## 2015-06-18 DIAGNOSIS — H43811 Vitreous degeneration, right eye: Secondary | ICD-10-CM | POA: Diagnosis not present

## 2015-06-19 ENCOUNTER — Ambulatory Visit (HOSPITAL_BASED_OUTPATIENT_CLINIC_OR_DEPARTMENT_OTHER)
Admission: RE | Admit: 2015-06-19 | Discharge: 2015-06-19 | Disposition: A | Discharge status: Home / Self Care | Payer: Medicare Other | Source: Ambulatory Visit | Attending: Family Medicine | Admitting: Family Medicine

## 2015-06-19 ENCOUNTER — Other Ambulatory Visit: Payer: Self-pay | Admitting: Family Medicine

## 2015-06-19 DIAGNOSIS — M7989 Other specified soft tissue disorders: Secondary | ICD-10-CM | POA: Diagnosis not present

## 2015-06-19 DIAGNOSIS — R600 Localized edema: Secondary | ICD-10-CM | POA: Diagnosis not present

## 2015-06-19 DIAGNOSIS — R936 Abnormal findings on diagnostic imaging of limbs: Secondary | ICD-10-CM | POA: Diagnosis not present

## 2015-06-21 ENCOUNTER — Telehealth: Payer: Self-pay | Admitting: Family Medicine

## 2015-06-21 ENCOUNTER — Encounter: Payer: Self-pay | Admitting: Family Medicine

## 2015-06-21 ENCOUNTER — Ambulatory Visit (INDEPENDENT_AMBULATORY_CARE_PROVIDER_SITE_OTHER): Payer: Medicare Other | Admitting: Family Medicine

## 2015-06-21 ENCOUNTER — Ambulatory Visit: Payer: Medicare Other | Admitting: Family Medicine

## 2015-06-21 VITALS — BP 118/84 | HR 78 | Temp 98.1°F | Ht 64.0 in | Wt 205.2 lb

## 2015-06-21 DIAGNOSIS — I10 Essential (primary) hypertension: Secondary | ICD-10-CM | POA: Diagnosis not present

## 2015-06-21 DIAGNOSIS — E782 Mixed hyperlipidemia: Secondary | ICD-10-CM | POA: Diagnosis not present

## 2015-06-21 DIAGNOSIS — K2100 Gastro-esophageal reflux disease with esophagitis, without bleeding: Secondary | ICD-10-CM

## 2015-06-21 DIAGNOSIS — M810 Age-related osteoporosis without current pathological fracture: Secondary | ICD-10-CM

## 2015-06-21 DIAGNOSIS — M25572 Pain in left ankle and joints of left foot: Secondary | ICD-10-CM

## 2015-06-21 DIAGNOSIS — R7303 Prediabetes: Secondary | ICD-10-CM

## 2015-06-21 DIAGNOSIS — N3010 Interstitial cystitis (chronic) without hematuria: Secondary | ICD-10-CM

## 2015-06-21 DIAGNOSIS — T148XXA Other injury of unspecified body region, initial encounter: Secondary | ICD-10-CM | POA: Insufficient documentation

## 2015-06-21 DIAGNOSIS — F418 Other specified anxiety disorders: Secondary | ICD-10-CM

## 2015-06-21 DIAGNOSIS — G4700 Insomnia, unspecified: Secondary | ICD-10-CM

## 2015-06-21 DIAGNOSIS — F419 Anxiety disorder, unspecified: Secondary | ICD-10-CM

## 2015-06-21 HISTORY — DX: Other injury of unspecified body region, initial encounter: T14.8XXA

## 2015-06-21 LAB — LIPID PANEL
CHOL/HDL RATIO: 6
Cholesterol: 214 mg/dL — ABNORMAL HIGH (ref 0–200)
HDL: 38.8 mg/dL — ABNORMAL LOW (ref >39.00)
NONHDL: 175.53
Triglycerides: 289 mg/dL — ABNORMAL HIGH (ref 0.0–149.0)
VLDL: 57.8 mg/dL — AB (ref 0.0–40.0)

## 2015-06-21 LAB — CBC
HEMATOCRIT: 37 % (ref 36.0–46.0)
HEMOGLOBIN: 12.1 g/dL (ref 12.0–15.0)
MCHC: 32.7 g/dL (ref 30.0–36.0)
MCV: 88.2 fl (ref 78.0–100.0)
PLATELETS: 290 10*3/uL (ref 150.0–400.0)
RBC: 4.2 Mil/uL (ref 3.87–5.11)
RDW: 14.4 % (ref 11.5–15.5)
WBC: 7.2 10*3/uL (ref 4.0–10.5)

## 2015-06-21 LAB — VITAMIN D 25 HYDROXY (VIT D DEFICIENCY, FRACTURES): VITD: 46.05 ng/mL (ref 30.00–100.00)

## 2015-06-21 LAB — LDL CHOLESTEROL, DIRECT: LDL DIRECT: 121 mg/dL

## 2015-06-21 LAB — COMPREHENSIVE METABOLIC PANEL
ALBUMIN: 4.3 g/dL (ref 3.5–5.2)
ALT: 23 U/L (ref 0–35)
AST: 24 U/L (ref 0–37)
Alkaline Phosphatase: 89 U/L (ref 39–117)
BUN: 14 mg/dL (ref 6–23)
CHLORIDE: 105 meq/L (ref 96–112)
CO2: 32 meq/L (ref 19–32)
CREATININE: 0.79 mg/dL (ref 0.40–1.20)
Calcium: 10.4 mg/dL (ref 8.4–10.5)
GFR: 76.24 mL/min (ref >60.00)
GLUCOSE: 90 mg/dL (ref 70–99)
Potassium: 4.8 mEq/L (ref 3.5–5.1)
SODIUM: 144 meq/L (ref 135–145)
Total Bilirubin: 0.4 mg/dL (ref 0.2–1.2)
Total Protein: 7 g/dL (ref 6.0–8.3)

## 2015-06-21 LAB — TSH: TSH: 1.89 u[IU]/mL (ref 0.35–4.50)

## 2015-06-21 LAB — HEMOGLOBIN A1C: Hgb A1c MFr Bld: 6 % (ref 4.6–6.5)

## 2015-06-21 MED ORDER — TELMISARTAN 80 MG PO TABS: ORAL_TABLET | ORAL | Status: DC

## 2015-06-21 MED ORDER — CARVEDILOL 12.5 MG PO TABS: 12.5000 mg | ORAL_TABLET | Freq: Two times a day (BID) | ORAL | Status: DC

## 2015-06-21 MED ORDER — GABAPENTIN 300 MG PO CAPS: 300.0000 mg | ORAL_CAPSULE | Freq: Two times a day (BID) | ORAL | Status: DC

## 2015-06-21 MED ORDER — CLONAZEPAM 1 MG PO TABS: ORAL_TABLET | ORAL | Status: DC

## 2015-06-21 MED ORDER — ROSUVASTATIN CALCIUM 20 MG PO TABS: ORAL_TABLET | ORAL | Status: DC

## 2015-06-21 MED ORDER — VENLAFAXINE HCL ER 150 MG PO CP24: 150.0000 mg | ORAL_CAPSULE | Freq: Every day | ORAL | Status: DC

## 2015-06-21 NOTE — Telephone Encounter (Signed)
Called Shannon [name] today to discuss results of imagining study (below for details) - Fluid collection has not resolved in >4 weeks, therefore agree with possible Morel lavalle injury. Shannon [name] amendable to referral to surgery to discuss options and removal of fluid.  - referral to surgery today. Shannon [name] had been established with Dr. Margot Chimes- [facility], uncertain if he is retired.   Shannon [name] Left Wo Contrast  06/20/2015  CLINICAL DATA:  Fall in garage 5 weeks ago injuring the lower leg. Continued swelling along the proximal tibia-[name]. Prior knee replacements. History of breast cancer. EXAM: MRI OF LOWER LEFT EXTREMITY WITHOUT CONTRAST TECHNIQUE: Multiplanar, multisequence Shannon imaging of the left tibia/ [name] was performed. No intravenous contrast was administered. COMPARISON:  05/24/2015 FINDINGS: Today' s exam was originally requested with contrast but IV access could not be obtained. The marked region of concern corresponds with a 12.1 by 9.0 by 1.9 cm (volume = 107.6 cc) Fluid collection tracking in the subcutaneous tissues along the anterior superficial fascia margin, as shown on image 14/11 and image 8/8. No underlying abnormality of the anterior compartmental musculature or of the tibia. The fluid collection has high T2 and low T1 signal characteristics. There is also low-level subcutaneous edema in both the right and left lower leg, but greater on the left than the right No other significant abnormalities. IMPRESSION: 1. Approximately 100 cc lenticular fluid collection tracking along the superficial fascia margin of the anterior proximal shin, with simple fluid characteristics by MRI. IV contrast could not be administered because IV access could not be obtained. Given the location along the superficial fascia margin, this could be a manifestation of shearing injury analogous to a [employer] injury. Occasionally such collections will not resolve spontaneously but require surgical therapy. No underlying bony or muscular  abnormality is identified. 2. Subcutaneous edema in both lower legs, but greater on the left than the right, nonspecific. Electronically Signed   By: Van Clines M.D.   On: 06/20/2015 12:34

## 2015-06-21 NOTE — Progress Notes (Signed)
Pre visit review using our clinic review tool, if applicable. No additional management support is needed unless otherwise documented below in the visit note. 

## 2015-06-21 NOTE — Patient Instructions (Signed)
DASH Eating Plan  DASH stands for "Dietary Approaches to Stop Hypertension." The DASH eating plan is a healthy eating plan that has been shown to reduce high blood pressure (hypertension). Additional health benefits may include reducing the risk of type 2 diabetes mellitus, heart disease, and stroke. The DASH eating plan may also help with weight loss.  WHAT DO I NEED TO KNOW ABOUT THE DASH EATING PLAN?  For the DASH eating plan, you will follow these general guidelines:  · Choose foods with a percent daily value for sodium of less than 5% (as listed on the food label).  · Use salt-free seasonings or herbs instead of table salt or sea salt.  · Check with your health care provider or pharmacist before using salt substitutes.  · Eat lower-sodium products, often labeled as "lower sodium" or "no salt added."  · Eat fresh foods.  · Eat more vegetables, fruits, and low-fat dairy products.  · Choose whole grains. Look for the word "whole" as the first word in the ingredient list.  · Choose fish and skinless chicken or turkey more often than red meat. Limit fish, poultry, and meat to 6 oz (170 g) each day.  · Limit sweets, desserts, sugars, and sugary drinks.  · Choose heart-healthy fats.  · Limit cheese to 1 oz (28 g) per day.  · Eat more home-cooked food and less restaurant, buffet, and fast food.  · Limit fried foods.  · Cook foods using methods other than frying.  · Limit canned vegetables. If you do use them, rinse them well to decrease the sodium.  · When eating at a restaurant, ask that your food be prepared with less salt, or no salt if possible.  WHAT FOODS CAN I EAT?  Seek help from a dietitian for individual calorie needs.  Grains  Whole grain or whole wheat bread. Brown rice. Whole grain or whole wheat pasta. Quinoa, bulgur, and whole grain cereals. Low-sodium cereals. Corn or whole wheat flour tortillas. Whole grain cornbread. Whole grain crackers. Low-sodium crackers.  Vegetables  Fresh or frozen vegetables  (raw, steamed, roasted, or grilled). Low-sodium or reduced-sodium tomato and vegetable juices. Low-sodium or reduced-sodium tomato sauce and paste. Low-sodium or reduced-sodium canned vegetables.   Fruits  All fresh, canned (in natural juice), or frozen fruits.  Meat and Other Protein Products  Ground beef (85% or leaner), grass-fed beef, or beef trimmed of fat. Skinless chicken or turkey. Ground chicken or turkey. Pork trimmed of fat. All fish and seafood. Eggs. Dried beans, peas, or lentils. Unsalted nuts and seeds. Unsalted canned beans.  Dairy  Low-fat dairy products, such as skim or 1% milk, 2% or reduced-fat cheeses, low-fat ricotta or cottage cheese, or plain low-fat yogurt. Low-sodium or reduced-sodium cheeses.  Fats and Oils  Tub margarines without trans fats. Light or reduced-fat mayonnaise and salad dressings (reduced sodium). Avocado. Safflower, olive, or canola oils. Natural peanut or almond butter.  Other  Unsalted popcorn and pretzels.  The items listed above may not be a complete list of recommended foods or beverages. Contact your dietitian for more options.  WHAT FOODS ARE NOT RECOMMENDED?  Grains  White bread. White pasta. White rice. Refined cornbread. Bagels and croissants. Crackers that contain trans fat.  Vegetables  Creamed or fried vegetables. Vegetables in a cheese sauce. Regular canned vegetables. Regular canned tomato sauce and paste. Regular tomato and vegetable juices.  Fruits  Dried fruits. Canned fruit in light or heavy syrup. Fruit juice.  Meat and Other Protein   Products  Fatty cuts of meat. Ribs, chicken wings, bacon, sausage, bologna, salami, chitterlings, fatback, hot dogs, bratwurst, and packaged luncheon meats. Salted nuts and seeds. Canned beans with salt.  Dairy  Whole or 2% milk, cream, half-and-half, and cream cheese. Whole-fat or sweetened yogurt. Full-fat cheeses or blue cheese. Nondairy creamers and whipped toppings. Processed cheese, cheese spreads, or cheese  curds.  Condiments  Onion and garlic salt, seasoned salt, table salt, and sea salt. Canned and packaged gravies. Worcestershire sauce. Tartar sauce. Barbecue sauce. Teriyaki sauce. Soy sauce, including reduced sodium. Steak sauce. Fish sauce. Oyster sauce. Cocktail sauce. Horseradish. Ketchup and mustard. Meat flavorings and tenderizers. Bouillon cubes. Hot sauce. Tabasco sauce. Marinades. Taco seasonings. Relishes.  Fats and Oils  Butter, stick margarine, lard, shortening, ghee, and bacon fat. Coconut, palm kernel, or palm oils. Regular salad dressings.  Other  Pickles and olives. Salted popcorn and pretzels.  The items listed above may not be a complete list of foods and beverages to avoid. Contact your dietitian for more information.  WHERE CAN I FIND MORE INFORMATION?  National Heart, Lung, and Blood Institute: www.nhlbi.nih.gov/health/health-topics/topics/dash/     This information is not intended to replace advice given to you by your health care provider. Make sure you discuss any questions you have with your health care provider.     Document Released: 01/05/2011 Document Revised: 02/06/2014 Document Reviewed: 11/20/2012  Elsevier Interactive Patient Education ©2016 [employer]

## 2015-06-22 DIAGNOSIS — H524 Presbyopia: Secondary | ICD-10-CM | POA: Diagnosis not present

## 2015-06-22 DIAGNOSIS — D352 Benign neoplasm of pituitary gland: Secondary | ICD-10-CM | POA: Diagnosis not present

## 2015-06-22 DIAGNOSIS — H25813 Combined forms of age-related cataract, bilateral: Secondary | ICD-10-CM | POA: Diagnosis not present

## 2015-06-24 ENCOUNTER — Other Ambulatory Visit: Payer: Self-pay

## 2015-06-24 ENCOUNTER — Other Ambulatory Visit: Payer: Self-pay | Admitting: Family Medicine

## 2015-06-24 MED ORDER — RANITIDINE HCL 150 MG PO TABS: 150.0000 mg | ORAL_TABLET | Freq: Two times a day (BID) | ORAL | Status: DC | PRN

## 2015-06-24 MED ORDER — ALENDRONATE SODIUM 70 MG PO TABS: 70.0000 mg | ORAL_TABLET | ORAL | Status: DC

## 2015-06-28 NOTE — Progress Notes (Signed)
Patient ID: Shannon [name], female   DOB: 25-Jan-1946, 70 y.o.   MRN: [id number]   Subjective:    Patient ID: Shannon [name], female    DOB: Apr 22, 1945, 70 y.o.   MRN: [id number]  Chief Complaint  Patient presents with  . Follow-up    HPI Patient is in today for follow up. She is doing well she has been struggling with stress as her son's health worsens but she feels she is managing well. No recent illness her son did have a bad episode of vertigo but he is doing better now after a trip to the ER. Denies CP/palp/SOB/HA/congestion/fevers/GI or GU c/o. Taking meds as prescribed  Past Medical History  Diagnosis Date  . NICM (nonischemic cardiomyopathy) ([place]) 03/08/2010    likely 2/2 chemotx - a. Echo 2012: EF 45-50%;  b. Lex MV 2/12:  low risk, apical defect (small area of ischemia vs shifting breast atten);  c.  Echo 7/12: Normal wall thickness, EF 60-65%, normal wall motion, grade 1 diastolic dysfunction, mild LAE, PASP 32;   d. Lex MV 11/13:  EF 76%, no ischemia  . GERD 08/11/2008  . HYPERLIPIDEMIA 08/11/2008  . HYPERTENSION 08/11/2008  . LIPOMA 02/10/2009  . PREDIABETES 06/15/2009  . 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 Carpal tunnel syndrome of left wrist   . Neuropathy ([facility])   . Interstitial cystitis   . Brain tumor ([place])   . DEPRESSION 08/11/2008  . 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 Osteoporosis   . Hyponatremia 08/28/2011  . Anemia 08/28/2011  . History of chicken pox   . H/O measles   . H/O mumps   . Dermatitis   . OA (osteoarthritis) 09/07/2011  . Shortness of breath   . Asthma   . History of blood transfusion   . UTI (lower urinary tract infection) 03/03/2012  . Constipation 03/03/2012  . 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 Obesity 09/28/2014  . Medicare annual wellness visit, subsequent 12/27/2014    Follows with Dr Amalia Hailey of urology Follows with Dr Posey Pronto at [facility] for opthamology Follows  with Dr [name] of dermatology Follows with [facility] gastroenterology, last scope done in 2011 repeat in 10 years Last [facility] July 2015 should repeat every 1-2 years Last Pap December 2015, repeat in 3 years.   . Melanoma ([place]) 2008    DR. Ronnald Ramp  . Breast cancer ([place])   . Facial fracture [facility])     Past Surgical History  Procedure Laterality Date  . Cholecystectomy    . Carpal tunnel release      L wrist, ulnar nerve moved  . Elbow surgery      left  . Tubal ligation  1997  . Lipoma excision  03/28/2009    right leg  . Portacath placement  may 2011  . Craniotomy for tumor  2000  . Tonsillectomy  1958  . Melanoma excision    . [place] cath    . Port-a-cath removal  11/30/2010    [place]  . Total knee raplacement  01-2012    Right Knee  . Total knee arthroplasty  02/05/2012    Procedure: TOTAL KNEE ARTHROPLASTY;  Surgeon: Gearlean Alf, MD;  Location: [facility];  Service: Orthopedics;  Laterality: Right;  . Total knee arthroplasty Left 02/10/2013    Procedure: LEFT TOTAL KNEE ARTHROPLASTY;  Surgeon: Gearlean Alf, MD;  Location: [facility];  Service: Orthopedics;  Laterality: Left;  . Left heart catheterization with coronary angiogram N/A 12/16/2013    Procedure: LEFT HEART CATHETERIZATION WITH CORONARY ANGIOGRAM;  Surgeon: Peter M [name], MD;  Location: [facility] CATH LAB;  Service: Cardiovascular;  Laterality: N/A;  . Breast lumpectomy  04/2009    RIGHT FOR BREAST CANCER-CHEMO/RADIATION X 1 YEAR  . Breast reduction surgery Bilateral 05/04/2014    Procedure: MAMMARY REDUCTION  (BREAST);  Surgeon: Cristine Polio, MD;  Location: [place];  Service: Plastics;  Laterality: Bilateral;  . Knee arthroscopy Left 10/12/2014    Procedure: LEFT KNEE ARTHROSCOPY ;  Surgeon: Gaynelle Arabian, MD;  Location: [facility];  Service: Orthopedics;  Laterality: Left;  . Synovectomy Left 10/12/2014    Procedure: WITH SYNOVECTOMY;  Surgeon: Gaynelle Arabian, MD;  Location: [facility];  Service: Orthopedics;  Laterality: Left;      Family History  Problem Relation Age of Onset  . Breast cancer Mother   . Lung cancer Mother   . Breast cancer Mother     sarcoma  . Hypertension Mother   . Colon cancer Neg Hx   . Prostate cancer Father   . Congestive Heart Failure Father   . Prostate cancer Father   . Prostate cancer Brother   . Prostate cancer Brother   . Stomach cancer Maternal Aunt   . Uterine cancer Maternal Aunt   . Down syndrome Son   . Heart disease Maternal Grandfather     MI  . Heart attack Father     Social History   Social History  . Marital Status: Married    Spouse Name: N/A  . Number of Children: 3  . Years of Education: N/A   Occupational History  . boutique owner    Social History Main Topics  . Smoking status: Never Smoker   . Smokeless tobacco: Never Used  . Alcohol Use: No  . Drug Use: No  . Sexual Activity: No   Other Topics Concern  . Not on file   Social History Narrative    Outpatient Prescriptions Prior to Visit  Medication Sig Dispense Refill  . acetaminophen (TYLENOL) 500 MG tablet Take 1,000 mg by mouth every 6 (six) hours as needed for moderate pain or headache.    Marland Kitchen aspirin EC 81 MG tablet Take 81 mg by mouth daily.    . B Complex-C (SUPER B COMPLEX PO) Take 1 tablet by mouth daily.    . Cholecalciferol 2000 UNITS CAPS Take 2,000 Units by mouth daily.    Marland Kitchen FERROCITE 324 MG TABS tablet TAKE 1 TABLET (106 MG OF IRON TOTAL) BY MOUTH DAILY. 30 tablet 4  . folic acid (FOLVITE) 1 MG tablet Take 1 tablet (1 mg total) by mouth daily. 90 tablet 0  . hydrOXYzine (ATARAX/VISTARIL) 25 MG tablet Take 25 mg by mouth at bedtime.    . pentosan polysulfate (ELMIRON) 100 MG capsule Take 200 mg by mouth 2 (two) times daily.    Marland Kitchen RA KRILL OIL 500 MG CAPS Take 1 tablet by mouth daily.    Marland Kitchen alendronate (FOSAMAX) 70 MG tablet Take 1 tablet (70 mg total) by mouth every 7 (seven) days. Take with a full glass of water on an empty stomach. 4 tablet 12  . baclofen (LIORESAL) 10 MG  tablet Take 1 tablet (10 mg total) by mouth 3 (three) times daily. 30 each 0  . carvedilol (COREG) 12.5 MG tablet Take 1 tablet (12.5 mg total) by mouth  2 (two) times daily with a meal. 180 tablet 3  . clonazePAM (KLONOPIN) 1 MG tablet TAKE 1/2 TABLET BY MOUTH AT BEDTIME. 30 tablet 0  . CRESTOR 20 MG tablet TAKE 1 TABLET (20 MG TOTAL) BY MOUTH AT BEDTIME. 30 tablet 5  . gabapentin (NEURONTIN) 300 MG capsule Take 300 mg by mouth 2 (two) times daily. Reported on 05/24/2015    . HYDROcodone-acetaminophen (NORCO/VICODIN) 5-325 MG tablet Take 2 tablets by mouth every 6 (six) hours as needed. 30 tablet 0  . ranitidine (ZANTAC) 150 MG tablet Take 1 tablet (150 mg total) by mouth 2 (two) times daily as needed for heartburn. 60 tablet 6  . telmisartan (MICARDIS) 80 MG tablet TAKE 1 TABLET (80 MG TOTAL) BY MOUTH DAILY. (Patient taking differently: Take 40 mg by mouth daily. ) 30 tablet 5  . venlafaxine XR (EFFEXOR-XR) 150 MG 24 hr capsule Take 1 capsule (150 mg total) by mouth daily. 90 capsule 0   No facility-administered medications prior to visit.    Allergies  Allergen Reactions  . Dilaudid [Hydromorphone Hcl] Itching    Review of Systems  Constitutional: Negative for fever and malaise/fatigue.  HENT: Negative for congestion.   Eyes: Negative for blurred vision.  Respiratory: Negative for shortness of breath.   Cardiovascular: Negative for chest pain, palpitations and leg swelling.  Gastrointestinal: Negative for nausea, abdominal pain and blood in stool.  Genitourinary: Negative for dysuria and frequency.  Musculoskeletal: Negative for falls.  Skin: Negative for rash.  Neurological: Positive for dizziness. Negative for loss of consciousness and headaches.  Endo/Heme/Allergies: Negative for environmental allergies.  Psychiatric/Behavioral: Negative for depression. The patient is nervous/anxious.        Objective:    Physical Exam  Constitutional: She is oriented to person, place, and  time. She appears well-developed and well-nourished. No distress.  HENT:  Head: Normocephalic and atraumatic.  Nose: Nose normal.  Eyes: Right eye exhibits no discharge. Left eye exhibits no discharge.  Neck: Normal range of motion. Neck supple.  Cardiovascular: Normal rate and regular rhythm.   No murmur heard. Pulmonary/Chest: Effort normal and breath sounds normal.  Abdominal: Soft. Bowel sounds are normal. There is no tenderness.  Musculoskeletal: She exhibits no edema.  Neurological: She is alert and oriented to person, place, and time.  Skin: Skin is warm and dry.  Psychiatric: She has a normal mood and affect.  Nursing note and vitals reviewed.   BP 118/84 mmHg  Pulse 78  Temp(Src) 98.1 F (36.7 C) (Oral)  Ht '5\' 4"'  (1.626 m)  Wt 205 lb 4 oz (93.101 kg)  BMI 35.21 kg/m2  SpO2 95%  LMP 01/30/1994 Wt Readings from Last 3 Encounters:  06/21/15 205 lb 4 oz (93.101 kg)  06/14/15 204 lb 12 oz (92.874 kg)  06/01/15 201 lb 4.8 oz (91.309 kg)     Lab Results  Component Value Date   WBC 7.2 06/21/2015   HGB 12.1 06/21/2015   HCT 37.0 06/21/2015   PLT 290.0 06/21/2015   GLUCOSE 90 06/21/2015   CHOL 214* 06/21/2015   TRIG 289.0* 06/21/2015   HDL 38.80* 06/21/2015   LDLDIRECT 121.0 06/21/2015   LDLCALC 120* 10/10/2013   ALT 23 06/21/2015   AST 24 06/21/2015   NA 144 06/21/2015   K 4.8 06/21/2015   CL 105 06/21/2015   CREATININE 0.79 06/21/2015   BUN 14 06/21/2015   CO2 32 06/21/2015   TSH 1.89 06/21/2015   INR 1.02 12/09/2013   HGBA1C 6.0 06/21/2015  Lab Results  Component Value Date   TSH 1.89 06/21/2015   Lab Results  Component Value Date   WBC 7.2 06/21/2015   HGB 12.1 06/21/2015   HCT 37.0 06/21/2015   MCV 88.2 06/21/2015   PLT 290.0 06/21/2015   Lab Results  Component Value Date   NA 144 06/21/2015   K 4.8 06/21/2015   CHLORIDE 107 11/24/2014   CO2 32 06/21/2015   GLUCOSE 90 06/21/2015   BUN 14 06/21/2015   CREATININE 0.79 06/21/2015    BILITOT 0.4 06/21/2015   ALKPHOS 89 06/21/2015   AST 24 06/21/2015   ALT 23 06/21/2015   PROT 7.0 06/21/2015   ALBUMIN 4.3 06/21/2015   CALCIUM 10.4 06/21/2015   ANIONGAP 6 11/24/2014   EGFR 70* 11/24/2014   GFR 76.24 06/21/2015   Lab Results  Component Value Date   CHOL 214* 06/21/2015   Lab Results  Component Value Date   HDL 38.80* 06/21/2015   Lab Results  Component Value Date   LDLCALC 120* 10/10/2013   Lab Results  Component Value Date   TRIG 289.0* 06/21/2015   Lab Results  Component Value Date   CHOLHDL 6 06/21/2015   Lab Results  Component Value Date   HGBA1C 6.0 06/21/2015       Assessment & Plan:   Problem List Items Addressed This Visit    Prediabetes   Relevant Orders   TSH (Completed)   CBC (Completed)   Lipid panel (Completed)   Comprehensive metabolic panel (Completed)   Hemoglobin A1c (Completed)   Vitamin D (25 hydroxy) (Completed)   Pain in joint, ankle and foot   Relevant Orders   TSH (Completed)   CBC (Completed)   Lipid panel (Completed)   Comprehensive metabolic panel (Completed)   Hemoglobin A1c (Completed)   Vitamin D (25 hydroxy) (Completed)   Osteoporosis    Encouraged to get adequate exercise, calcium and vitamin d intake      Relevant Orders   TSH (Completed)   CBC (Completed)   Lipid panel (Completed)   Comprehensive metabolic panel (Completed)   Hemoglobin A1c (Completed)   Vitamin D (25 hydroxy) (Completed)   Insomnia    Encouraged good sleep hygiene such as dark, quiet room. No blue/green glowing lights such as computer screens in bedroom. No alcohol or stimulants in evening. Cut down on caffeine as able. Regular exercise is helpful but not just prior to bed time.       IC (interstitial cystitis)   Relevant Orders   TSH (Completed)   CBC (Completed)   Lipid panel (Completed)   Comprehensive metabolic panel (Completed)   Hemoglobin A1c (Completed)   Vitamin D (25 hydroxy) (Completed)   Hyperlipidemia, mixed    Relevant Medications   telmisartan (MICARDIS) 80 MG tablet   carvedilol (COREG) 12.5 MG tablet   rosuvastatin (CRESTOR) 20 MG tablet   Other Relevant Orders   TSH (Completed)   CBC (Completed)   Lipid panel (Completed)   Comprehensive metabolic panel (Completed)   Hemoglobin A1c (Completed)   Vitamin D (25 hydroxy) (Completed)   Gastro-esophageal reflux    Avoid offending foods, start probiotics. Do not eat large meals in late evening and consider raising head of bed.       Relevant Orders   TSH (Completed)   CBC (Completed)   Lipid panel (Completed)   Comprehensive metabolic panel (Completed)   Hemoglobin A1c (Completed)   Vitamin D (25 hydroxy) (Completed)   Essential hypertension - Primary  Well controlled, no changes to meds. Encouraged heart healthy diet such as the DASH diet and exercise as tolerated.       Relevant Medications   telmisartan (MICARDIS) 80 MG tablet   carvedilol (COREG) 12.5 MG tablet   rosuvastatin (CRESTOR) 20 MG tablet   Other Relevant Orders   TSH (Completed)   CBC (Completed)   Lipid panel (Completed)   Comprehensive metabolic panel (Completed)   Hemoglobin A1c (Completed)   Vitamin D (25 hydroxy) (Completed)   Anxiety    Stable on Venlafaxine, is struggling with stress as her son's health fails but is doing well for the most part no changes      Relevant Medications   venlafaxine XR (EFFEXOR-XR) 150 MG 24 hr capsule    Other Visit Diagnoses    Depression with anxiety        Relevant Orders    Ambulatory referral to Psychology       I have discontinued Ms. Mcclintock's alendronate, HYDROcodone-acetaminophen, and baclofen. I have changed her CRESTOR to rosuvastatin. I have also changed her gabapentin and clonazePAM. Additionally, I am having her maintain her hydrOXYzine, pentosan polysulfate, Cholecalciferol, B Complex-C (SUPER B COMPLEX PO), aspirin EC, RA KRILL OIL, FERROCITE, acetaminophen, folic acid, telmisartan, carvedilol, and  venlafaxine XR.  Meds ordered this encounter  Medications  . telmisartan (MICARDIS) 80 MG tablet    Sig: TAKE 1 TABLET (80 MG TOTAL) BY MOUTH DAILY.    Dispense:  90 tablet    Refill:  1  . gabapentin (NEURONTIN) 300 MG capsule    Sig: Take 1 capsule (300 mg total) by mouth 2 (two) times daily. Reported on 05/24/2015    Dispense:  180 capsule    Refill:  1  . carvedilol (COREG) 12.5 MG tablet    Sig: Take 1 tablet (12.5 mg total) by mouth 2 (two) times daily with a meal.    Dispense:  180 tablet    Refill:  1  . venlafaxine XR (EFFEXOR-XR) 150 MG 24 hr capsule    Sig: Take 1 capsule (150 mg total) by mouth daily.    Dispense:  90 capsule    Refill:  1  . rosuvastatin (CRESTOR) 20 MG tablet    Sig: TAKE 1 TABLET (20 MG TOTAL) BY MOUTH AT BEDTIME.    Dispense:  30 tablet    Refill:  5  . clonazePAM (KLONOPIN) 1 MG tablet    Sig: TAKE 1/2 to 1 TABLET BY MOUTH AT BEDTIME.    Dispense:  30 tablet    Refill:  5    Not to exceed 5 additional fills before 09/11/2015     Penni Homans, MD

## 2015-06-28 NOTE — Assessment & Plan Note (Signed)
Encouraged good sleep hygiene such as dark, quiet room. No blue/green glowing lights such as computer screens in bedroom. No alcohol or stimulants in evening. Cut down on caffeine as able. Regular exercise is helpful but not just prior to bed time.  

## 2015-06-28 NOTE — Assessment & Plan Note (Signed)
Stable on Venlafaxine, is struggling with stress as her son's health fails but is doing well for the most part no changes

## 2015-06-28 NOTE — Assessment & Plan Note (Signed)
Avoid offending foods, start probiotics. Do not eat large meals in late evening and consider raising head of bed.  

## 2015-06-28 NOTE — Assessment & Plan Note (Signed)
Well controlled, no changes to meds. Encouraged heart healthy diet such as the DASH diet and exercise as tolerated.  °

## 2015-06-28 NOTE — Assessment & Plan Note (Signed)
Encouraged to get adequate exercise, calcium and vitamin d intake 

## 2015-07-01 DIAGNOSIS — Z96652 Presence of left artificial knee joint: Secondary | ICD-10-CM | POA: Diagnosis not present

## 2015-07-01 DIAGNOSIS — Z471 Aftercare following joint replacement surgery: Secondary | ICD-10-CM | POA: Diagnosis not present

## 2015-07-23 ENCOUNTER — Encounter: Payer: Self-pay | Admitting: Genetic Counselor

## 2015-08-10 DIAGNOSIS — S8012XD Contusion of left lower leg, subsequent encounter: Secondary | ICD-10-CM | POA: Diagnosis not present

## 2015-08-18 DIAGNOSIS — S8012XD Contusion of left lower leg, subsequent encounter: Secondary | ICD-10-CM | POA: Diagnosis not present

## 2015-08-31 DIAGNOSIS — Z96651 Presence of right artificial knee joint: Secondary | ICD-10-CM | POA: Diagnosis not present

## 2015-08-31 DIAGNOSIS — Z96652 Presence of left artificial knee joint: Secondary | ICD-10-CM | POA: Diagnosis not present

## 2015-08-31 DIAGNOSIS — Z471 Aftercare following joint replacement surgery: Secondary | ICD-10-CM | POA: Diagnosis not present

## 2015-08-31 DIAGNOSIS — S8012XD Contusion of left lower leg, subsequent encounter: Secondary | ICD-10-CM | POA: Diagnosis not present

## 2015-08-31 DIAGNOSIS — Z96653 Presence of artificial knee joint, bilateral: Secondary | ICD-10-CM | POA: Diagnosis not present

## 2015-09-03 ENCOUNTER — Encounter: Payer: Self-pay | Admitting: Genetic Counselor

## 2015-09-07 ENCOUNTER — Other Ambulatory Visit: Payer: Medicare Other

## 2015-09-07 ENCOUNTER — Ambulatory Visit (HOSPITAL_BASED_OUTPATIENT_CLINIC_OR_DEPARTMENT_OTHER): Payer: Medicare Other | Admitting: Genetic Counselor

## 2015-09-07 DIAGNOSIS — Z809 Family history of malignant neoplasm, unspecified: Secondary | ICD-10-CM

## 2015-09-07 DIAGNOSIS — Z803 Family history of malignant neoplasm of breast: Secondary | ICD-10-CM | POA: Diagnosis not present

## 2015-09-07 DIAGNOSIS — Z87898 Personal history of other specified conditions: Secondary | ICD-10-CM | POA: Diagnosis not present

## 2015-09-07 DIAGNOSIS — Z800 Family history of malignant neoplasm of digestive organs: Secondary | ICD-10-CM | POA: Diagnosis not present

## 2015-09-07 DIAGNOSIS — Z853 Personal history of malignant neoplasm of breast: Secondary | ICD-10-CM

## 2015-09-07 DIAGNOSIS — Z808 Family history of malignant neoplasm of other organs or systems: Secondary | ICD-10-CM

## 2015-09-07 DIAGNOSIS — Z85820 Personal history of malignant melanoma of skin: Secondary | ICD-10-CM | POA: Diagnosis not present

## 2015-09-07 DIAGNOSIS — Z8042 Family history of malignant neoplasm of prostate: Secondary | ICD-10-CM

## 2015-09-07 DIAGNOSIS — Z315 Encounter for genetic counseling: Secondary | ICD-10-CM | POA: Diagnosis not present

## 2015-09-07 DIAGNOSIS — Z86011 Personal history of benign neoplasm of the brain: Secondary | ICD-10-CM

## 2015-09-07 DIAGNOSIS — Z8603 Personal history of neoplasm of uncertain behavior: Secondary | ICD-10-CM

## 2015-09-07 DIAGNOSIS — Z86018 Personal history of other benign neoplasm: Secondary | ICD-10-CM

## 2015-09-08 DIAGNOSIS — Z800 Family history of malignant neoplasm of digestive organs: Secondary | ICD-10-CM | POA: Insufficient documentation

## 2015-09-08 DIAGNOSIS — Z809 Family history of malignant neoplasm, unspecified: Secondary | ICD-10-CM | POA: Insufficient documentation

## 2015-09-08 NOTE — Progress Notes (Signed)
REFERRING PROVIDER: Lurline Del, MD  PRIMARY PROVIDER:  Penni Homans, MD  PRIMARY REASON FOR VISIT:  1. History of breast cancer in female   2. Family history of prostate cancer   3. Family history of breast cancer in mother   101. History of melanoma   5. Family history- stomach cancer   6. History of craniopharyngioma   7. History of lipoma   8. Family history of basal cell carcinoma   9. Family history of cancer      HISTORY OF PRESENT ILLNESS:   Shannon [name], a 70 y.o. female, was seen for a [place] cancer genetics consultation at the request of Dr. Jana Hakim due to a personal history of breast cancer and melanoma and family history of breast, prostate, stomach, and other cancers.  Shannon [name] presents to clinic today to discuss the possibility of a hereditary predisposition to cancer, genetic testing, and to further clarify her future cancer risks, as well as potential cancer risks for family members.   In April 2011, at the age of 22, Shannon [name] was diagnosed with invasive ductal carcinoma with DCIS of the right breast.  Hormone receptor status was ER/PR-, Her2+. This was treated with chemotherapy, right lumpectomy, and radiation therapy.  Shannon [name] was also diagnosed with melanoma in 2008, with a benign craniopharyngioma of the pituitary gland in 2000 and treated with surgery, and was diagnosed with a lipoma of her right thigh in 2011.  Shannon [name] underwent genetic testing following her breast cancer diagnosis in 2011.  She had BRACAnalysis testing through [employer] and this was negative; the date of report is September 30, 2009.  Shannon [name] is returning to clinic today to discuss updated genetic testing options.   CANCER HISTORY:    Breast cancer of lower-inner quadrant of right female breast ([name])   04/2009 Initial Biopsy    Right breast core needle bx: IDC, ER/PR-, HER2/neu positive, Ki67 58%     04/2009 Clinical Stage    Stage IA: T1c No      - 08/2009 Neo-Adjuvant  Chemotherapy    Taxol/ carbo/trastuzumab x 2, then Taxol/trastuzumab weekly x 3 (with last cycle with weekly carbo), A/C x 2 cycles with doxorubicin by continuous infusion (chemo poorly tolerated)     09/2009 Definitive Surgery    Right lumpectomy/SLNB: complete pathologic response      - 11/2009 Radiation Therapy    Adjuvant RT: right breast     09/2009 - 01/2010 Chemotherapy    Maintenance trastuzumab; discontinued due to drop in EF, which has since respolved       HORMONAL RISK FACTORS:  Menarche was at age 73.  First live birth at age 15.  OCP use for approximately 0 years.  Ovaries intact: yes.  Hysterectomy: no.  Menopausal status: postmenopausal.  HRT use: "for several years" years. Colonoscopy: yes; most recent colonoscopy was approx 6 years ago, per patient report; she reprots having 3 polyps total. Mammogram within the last year: yes. Number of breast biopsies: 1. Up to date with pelvic exams:  yes. Any excessive radiation exposure in the past:  History of radiation therapy with previous breast cancer; history of some secondhand smoke exposure (husband used to smoke 30 years ago)  Past Medical History:  Diagnosis Date  . 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 Anemia 08/28/2011  . Asthma   . Brain tumor ([place])   . Breast cancer ([place])   . Carpal tunnel syndrome of left wrist   .  Constipation 03/03/2012  . DEPRESSION 08/11/2008  . Dermatitis   . Facial fracture ([place])   . GERD 08/11/2008  . H/O measles   . H/O mumps   . History of blood transfusion   . History of chicken pox   . 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 HYPERLIPIDEMIA 08/11/2008  . HYPERTENSION 08/11/2008  . Hyponatremia 08/28/2011  . 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 Interstitial cystitis   . LIPOMA 02/10/2009  . Medicare annual wellness visit, subsequent 12/27/2014   Follows with Dr Amalia Hailey of urology Follows with Dr  Posey Pronto at [facility] for opthamology Follows with Dr [name] of dermatology Follows with [facility] gastroenterology, last scope done in 2011 repeat in 10 years Last [facility] July 2015 should repeat every 1-2 years Last Pap December 2015, repeat in 3 years.   . Melanoma ([place]) 2008   DR. Ronnald Ramp  . Neuropathy ([place])   . NICM (nonischemic cardiomyopathy) ([place]) 03/08/2010   likely 2/2 chemotx - a. Echo 2012: EF 45-50%;  b. Lex MV 2/12:  low risk, apical defect (small area of ischemia vs shifting breast atten);  c.  Echo 7/12: Normal wall thickness, EF 60-65%, normal wall motion, grade 1 diastolic dysfunction, mild LAE, PASP 32;   d. Lex MV 11/13:  EF 76%, no ischemia  . OA (osteoarthritis) 09/07/2011  . Obesity 09/28/2014  . Osteoporosis   . PREDIABETES 06/15/2009  . Shortness of breath   . UTI (lower urinary tract infection) 03/03/2012    Past Surgical History:  Procedure Laterality Date  . BREAST LUMPECTOMY  04/2009   RIGHT FOR BREAST CANCER-CHEMO/RADIATION X 1 YEAR  . BREAST REDUCTION SURGERY Bilateral 05/04/2014   Procedure: MAMMARY REDUCTION  (BREAST);  Surgeon: Cristine Polio, MD;  Location: [facility];  Service: Plastics;  Laterality: Bilateral;  . CARPAL TUNNEL RELEASE     L wrist, ulnar nerve moved  . CHOLECYSTECTOMY    . CRANIOTOMY FOR TUMOR  2000  . ELBOW SURGERY     left  . KNEE ARTHROSCOPY Left 10/12/2014   Procedure: LEFT KNEE ARTHROSCOPY ;  Surgeon: Gaynelle Arabian, MD;  Location: [facility];  Service: Orthopedics;  Laterality: Left;  . LEFT HEART CATHETERIZATION WITH CORONARY ANGIOGRAM N/A 12/16/2013   Procedure: LEFT HEART CATHETERIZATION WITH CORONARY ANGIOGRAM;  Surgeon: Peter M [name], MD;  Location: [facility] CATH LAB;  Service: Cardiovascular;  Laterality: N/A;  . LIPOMA EXCISION  03/28/2009   right leg  . MELANOMA EXCISION    . PORT-A-CATH REMOVAL  11/30/2010   [place]  . porta cath    . PORTACATH PLACEMENT  may 2011  . SYNOVECTOMY Left 10/12/2014   Procedure: WITH SYNOVECTOMY;  Surgeon:  Gaynelle Arabian, MD;  Location: [facility];  Service: Orthopedics;  Laterality: Left;  . TONSILLECTOMY  1958  . TOTAL KNEE ARTHROPLASTY  02/05/2012   Procedure: TOTAL KNEE ARTHROPLASTY;  Surgeon: Gearlean Alf, MD;  Location: [facility];  Service: Orthopedics;  Laterality: Right;  . TOTAL KNEE ARTHROPLASTY Left 02/10/2013   Procedure: LEFT TOTAL KNEE ARTHROPLASTY;  Surgeon: Gearlean Alf, MD;  Location: [facility];  Service: Orthopedics;  Laterality: Left;  . total knee raplacement  01-2012   Right Knee  . TUBAL LIGATION  1997    Social History   Social History  . Marital status: Married    Spouse name: N/A  . Number of children: 3  .  Years of education: N/A   Occupational History  . boutique owner [employer]   Social History Main Topics  . Smoking status: Never Smoker  . Smokeless tobacco: Never Used  . Alcohol use No  . Drug use: No  . Sexual activity: No   Other Topics Concern  . Not on file   Social History Narrative  . No narrative on file     FAMILY HISTORY:  We obtained a detailed, 4-generation family history.  Significant diagnoses are listed below: Family History  Problem Relation Age of Onset  . Breast cancer Mother     sarcoma  . Lung cancer Mother   . Hypertension Mother   . Prostate cancer Father   . Congestive Heart Failure Father   . Heart attack Father   . Prostate cancer Brother   . Down syndrome Son   . Heart disease Maternal Grandfather     MI  . Stomach cancer Maternal Aunt   . Uterine cancer Maternal Aunt   . Colon cancer Neg Hx     Shannon [name] has three children--two sons, ages 41 and 35 and one daughter, age 52.  None of her children have ever had cancer.  Shannon [name] youngest son has Down syndrome and has more recently been diagnosed with dementia.  Shannon [name] has four grandchildren between the ages of 37 and 98 years.  Shannon [name] had one full brother who is currently 48 and one full sister who is currently 61.  Shannon [name] brother was diagnosed with  prostate cancer in his early to [place] and was treated with prostatectomy.  He also has a history of basal cell carcinoma.  Shannon [name] sister has never had cancer.    Shannon [name] mother was diagnoed with breast cancer in her late 66s.  She was later diagnosed with a sarcoma of her leg at the age of 59 and this was treated with surgery and replacement of her "long bone" with a steel rod.  Shannon [name] mother also had a history of a hysterectomy at the age of 64 due to abnormal bleeding.  She passed away at the age of 18.  Shannon [name] mother had four full sisters and one full brother.  One sister was diagnosed with stomach cancer at the age of 89 and passed away at 23.  One sister passed away at 27 from dementia.  Two other sisters are in their early 81s-early 90s and have not had cancer.  Her brother died of a heart attack at 72.  Ms. Hanks is unaware of any cancer history for her maternal first cousins.  Her maternal grandmother passed away at 65.  Her grandfather died of a heart attack at 65.  Shannon [name] has no information for her maternal great aunts/uncles and great grandparents.  Ms. Zilberman father was diagnosed with prostate cancer at the age of 12 and underwent radiation.  He also had a history of heart problems and he died of pneumonia at the age of 77.  He had seven full brothers and four full sisters, most of whom passed away at later ages due to heart-related causes.  Ms. Pawlicki is not aware of a history of cancer for any of these aunts/uncles.  A paternal first cousin, once-removed to Ms. Bellissimo was diagnosed with breast cancer in her 62s.  Ms. Mulka has limited information for other paternal cousins.  Her paternal grandmother died of pneumonia at the age of 37.  Her grandfather died at the age  of 100.  She has no further information for her paternal great aunts/uncles and great grandparents.  Patient's maternal ancestors are of [place] descent, and paternal ancestors are of [place] descent. There is  no reported Ashkenazi Jewish ancestry. There is no known consanguinity.  GENETIC COUNSELING ASSESSMENT: KYMORAH KORF is a 70 y.o. female with a personal and family history of cancer which is somewhat suggestive of a hereditary cancer syndrome and predisposition to cancer. We, therefore, discussed and recommended the following at today's visit.   DISCUSSION: We reviewed the characteristics, features and inheritance patterns of hereditary cancer syndromes, particularly those caused by mutations within the PALB2, ATM, CHEK2, and Lynch syndrome genes. We also discussed genetic testing, including the appropriate family members to test, the process of testing, insurance coverage and turn-around-time for results. We discussed the implications of a negative, positive and/or variant of uncertain significant result. Due to the additional family history of early-onset stomach cancer, as well as Ms. Rolph's wish to include further melanoma gene analysis, we recommended Ms. Milos pursue genetic testing for a 34-gene Custom Cancer Panel with MSH2 Exons 1-7 Inversion Analysis through [facility] [employer] [facility].  This Custom Panel offered by [facility] includes sequencing and/or deletion duplication testing of the following 34 genes: APC, ATM, AXIN2, BAP1, BARD1, BMPR1A, BRCA1, BRCA2, BRIP1, CDH1, CDK4, CDKN2A, CHEK2, EPCAM, FANCC, MITF, MLH1, MSH2, MSH6, MUTYH, NBN, PALB2, PMS2, POLD1, POLE, PTEN, RAD51C, RAD51D, SCG5/GREM1, SMAD4, STK11, TP53, VHL, and XRCC2.     Based on Ms. Noy's personal and family history of cancer, she meets medical criteria for genetic testing. Despite that she meets criteria, she may still have an out of pocket cost. We discussed that if her out of pocket cost for testing is over $100, the laboratory will call and confirm whether she wants to proceed with testing.  If the out of pocket cost of testing is less than $100 she will be billed by the genetic testing laboratory.   PLAN: After considering the  risks, benefits, and limitations, Ms. Rupnow  provided informed consent to pursue genetic testing and the blood sample was sent to [facility] for analysis of the 34-gene Custom Panel with MSH2 Exons 1-7 Inversion Analysis. Results should be available within approximately 3 weeks' time, at which point they will be disclosed by telephone to Ms. Zimmerle, as will any additional recommendations warranted by these results. Ms. Krizek will receive a summary of her genetic counseling visit and a copy of her results once available. This information will also be available in Epic. We encouraged Ms. Pallett to remain in contact with cancer genetics annually so that we can continuously update the family history and inform her of any changes in cancer genetics and testing that may be of benefit for her family. Ms. Crafton questions were answered to her satisfaction today. Our contact information was provided should additional questions or concerns arise.  Thank you for the referral and allowing [place] to share in the care of your patient.   Jeanine Luz, MS, [facility] Certified Genetic Counselor [email or web address] Phone: [phone]  The patient was seen for a total of 60 minutes in face-to-face genetic counseling.  This patient was discussed with Drs. Magrinat, Lindi Adie and/or Burr Medico who agrees with the above.    _______________________________________________________________________ For Office Staff:  Number of people involved in session: 1 Was an Intern/ student involved with case: no

## 2015-09-19 ENCOUNTER — Other Ambulatory Visit: Payer: Self-pay | Admitting: Family Medicine

## 2015-09-27 ENCOUNTER — Telehealth: Payer: Self-pay | Admitting: Genetic Counselor

## 2015-09-28 DIAGNOSIS — S8012XD Contusion of left lower leg, subsequent encounter: Secondary | ICD-10-CM | POA: Diagnosis not present

## 2015-10-01 ENCOUNTER — Ambulatory Visit: Payer: Self-pay | Admitting: Genetic Counselor

## 2015-10-01 DIAGNOSIS — Z85820 Personal history of malignant melanoma of skin: Secondary | ICD-10-CM

## 2015-10-01 DIAGNOSIS — Z8603 Personal history of neoplasm of uncertain behavior: Secondary | ICD-10-CM

## 2015-10-01 DIAGNOSIS — Z1379 Encounter for other screening for genetic and chromosomal anomalies: Secondary | ICD-10-CM

## 2015-10-01 DIAGNOSIS — Z803 Family history of malignant neoplasm of breast: Secondary | ICD-10-CM

## 2015-10-01 DIAGNOSIS — Z800 Family history of malignant neoplasm of digestive organs: Secondary | ICD-10-CM

## 2015-10-01 DIAGNOSIS — Z86011 Personal history of benign neoplasm of the brain: Secondary | ICD-10-CM

## 2015-10-01 DIAGNOSIS — Z853 Personal history of malignant neoplasm of breast: Secondary | ICD-10-CM

## 2015-10-01 DIAGNOSIS — Z8042 Family history of malignant neoplasm of prostate: Secondary | ICD-10-CM

## 2015-10-01 NOTE — Telephone Encounter (Signed)
Discussed with Shannon [name] that her genetic test results were negative for mutations within any of 34 genes that would increase her risk for breast, ovarian, GI, melanoma, or other related cancers.  Additionally, no uncertain changes were found.  Discussed that this is most likely reassuring for [place] that her cancer history was sporadic.  Discussed that she should continue to follow her doctors recommendations for future screening.  Her daughter, nieces, and sister are still at some increased risk for breast cancer due to the family history.  They should get mammograms annually and should make their primary doctors aware of this family history.  Shannon [name] is happy to receive this news.  She is welcome to call with any questions.  I will mail her a copy of her test results.

## 2015-10-09 ENCOUNTER — Other Ambulatory Visit: Payer: Self-pay | Admitting: Family Medicine

## 2015-10-14 ENCOUNTER — Other Ambulatory Visit: Payer: Self-pay | Admitting: Family Medicine

## 2015-11-02 DIAGNOSIS — S8012XD Contusion of left lower leg, subsequent encounter: Secondary | ICD-10-CM | POA: Diagnosis not present

## 2015-11-04 ENCOUNTER — Encounter: Payer: Self-pay | Admitting: Family Medicine

## 2015-11-04 ENCOUNTER — Ambulatory Visit (INDEPENDENT_AMBULATORY_CARE_PROVIDER_SITE_OTHER): Payer: Medicare Other | Admitting: Family Medicine

## 2015-11-04 VITALS — BP 132/80 | HR 74 | Temp 98.0°F | Ht 64.0 in | Wt 215.0 lb

## 2015-11-04 DIAGNOSIS — F3289 Other specified depressive episodes: Secondary | ICD-10-CM

## 2015-11-04 DIAGNOSIS — Z23 Encounter for immunization: Secondary | ICD-10-CM | POA: Diagnosis not present

## 2015-11-04 DIAGNOSIS — I10 Essential (primary) hypertension: Secondary | ICD-10-CM

## 2015-11-04 DIAGNOSIS — R0789 Other chest pain: Secondary | ICD-10-CM | POA: Diagnosis not present

## 2015-11-04 DIAGNOSIS — R7303 Prediabetes: Secondary | ICD-10-CM

## 2015-11-04 DIAGNOSIS — E669 Obesity, unspecified: Secondary | ICD-10-CM | POA: Diagnosis not present

## 2015-11-04 DIAGNOSIS — F418 Other specified anxiety disorders: Secondary | ICD-10-CM

## 2015-11-04 DIAGNOSIS — E782 Mixed hyperlipidemia: Secondary | ICD-10-CM | POA: Diagnosis not present

## 2015-11-04 DIAGNOSIS — M7989 Other specified soft tissue disorders: Secondary | ICD-10-CM

## 2015-11-04 LAB — COMPREHENSIVE METABOLIC PANEL
ALK PHOS: 102 U/L (ref 39–117)
ALT: 25 U/L (ref 0–35)
AST: 26 U/L (ref 0–37)
Albumin: 4 g/dL (ref 3.5–5.2)
BUN: 12 mg/dL (ref 6–23)
CO2: 31 meq/L (ref 19–32)
Calcium: 9.3 mg/dL (ref 8.4–10.5)
Chloride: 105 mEq/L (ref 96–112)
Creatinine, Ser: 0.85 mg/dL (ref 0.40–1.20)
GFR: 69.99 mL/min (ref >60.00)
GLUCOSE: 87 mg/dL (ref 70–99)
POTASSIUM: 4.1 meq/L (ref 3.5–5.1)
SODIUM: 143 meq/L (ref 135–145)
TOTAL PROTEIN: 7 g/dL (ref 6.0–8.3)
Total Bilirubin: 0.4 mg/dL (ref 0.2–1.2)

## 2015-11-04 LAB — CBC
HEMATOCRIT: 35.2 % — AB (ref 36.0–46.0)
Hemoglobin: 11.7 g/dL — ABNORMAL LOW (ref 12.0–15.0)
MCHC: 33.3 g/dL (ref 30.0–36.0)
MCV: 86.2 fl (ref 78.0–100.0)
Platelets: 240 10*3/uL (ref 150.0–400.0)
RBC: 4.08 Mil/uL (ref 3.87–5.11)
RDW: 14.2 % (ref 11.5–15.5)
WBC: 6.3 10*3/uL (ref 4.0–10.5)

## 2015-11-04 LAB — LDL CHOLESTEROL, DIRECT: Direct LDL: 98 mg/dL

## 2015-11-04 LAB — TSH: TSH: 2.06 u[IU]/mL (ref 0.35–4.50)

## 2015-11-04 LAB — LIPID PANEL
Cholesterol: 176 mg/dL (ref 0–200)
HDL: 45.7 mg/dL (ref >39.00)
NONHDL: 130.3
Total CHOL/HDL Ratio: 4
Triglycerides: 264 mg/dL — ABNORMAL HIGH (ref 0.0–149.0)
VLDL: 52.8 mg/dL — ABNORMAL HIGH (ref 0.0–40.0)

## 2015-11-04 LAB — HEMOGLOBIN A1C: HEMOGLOBIN A1C: 5.9 % (ref 4.6–6.5)

## 2015-11-04 MED ORDER — VENLAFAXINE HCL ER 225 MG PO TB24: 225.0000 mg | ORAL_TABLET | Freq: Every day | ORAL | 3 refills | Status: DC

## 2015-11-04 MED ORDER — NITROGLYCERIN 0.4 MG SL SUBL: 0.4000 mg | SUBLINGUAL_TABLET | SUBLINGUAL | 3 refills | Status: DC | PRN

## 2015-11-04 MED ORDER — ASPIRIN EC 81 MG PO TBEC: 162.0000 mg | DELAYED_RELEASE_TABLET | Freq: Every day | ORAL | Status: DC

## 2015-11-04 MED ORDER — HYDROCHLOROTHIAZIDE 12.5 MG PO CAPS: 12.5000 mg | ORAL_CAPSULE | Freq: Every day | ORAL | 2 refills | Status: DC

## 2015-11-04 NOTE — Assessment & Plan Note (Signed)
minimize simple carbs. Increase exercise as tolerated.  

## 2015-11-04 NOTE — Progress Notes (Signed)
Pre visit review using our clinic review tool, if applicable. No additional management support is needed unless otherwise documented below in the visit note. 

## 2015-11-04 NOTE — Assessment & Plan Note (Signed)
Running [id number] to Q000111Q  Systolic recently will add the HCTZ 12.5 mg daily

## 2015-11-04 NOTE — Assessment & Plan Note (Signed)
Describes 4-5 times in past 2-3 weeks. Happens with stress and exertion and lasts roughly 5-30 minutes then resolves with rest. No symptoms today. 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 If chest pain recurs and does not resolve will go to ER

## 2015-11-04 NOTE — Assessment & Plan Note (Signed)
Encouraged DASH diet, decrease po intake and increase exercise as tolerated. Needs 7-8 hours of sleep nightly. Avoid trans fats, eat small, frequent meals every 4-5 hours with lean proteins, complex carbs and healthy fats. Minimize simple carbs 

## 2015-11-04 NOTE — Patient Instructions (Signed)

## 2015-11-05 ENCOUNTER — Other Ambulatory Visit: Payer: Self-pay | Admitting: Family Medicine

## 2015-11-05 MED ORDER — FERROUS FUMARATE 324 (106 FE) MG PO TABS: 1.0000 | ORAL_TABLET | Freq: Every day | ORAL | 3 refills | Status: DC

## 2015-11-05 MED ORDER — ROSUVASTATIN CALCIUM 20 MG PO TABS: ORAL_TABLET | ORAL | 3 refills | Status: DC

## 2015-11-06 NOTE — Assessment & Plan Note (Addendum)
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 She is very stressed but she feels she is managing well enough that she does not want to consider medicaitons. She does agree to accept a referral for counseling.

## 2015-11-06 NOTE — Assessment & Plan Note (Signed)
Mild and stable

## 2015-11-06 NOTE — Progress Notes (Signed)
Patient ID: Shannon [name], female   DOB: 12/31/1945, 69 y.o.   MRN: [id number]   Subjective:    Patient ID: Shannon [name], female    DOB: 10-29-1945, 70 y.o.   MRN: [id number]  Chief Complaint  Patient presents with  . Hypertension    light headed  . Chest Pain    HPI Patient is in today for evaluation of stress, atypical chest pain and elevated blood pressure. 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 Over the past 2 weeks she has had 4 or 5 episodes of chest discomfort that lasts only seconds. It occurs when she is busy or stressed. There is some radiation of discomfort into left arm at times. There are no other associated symptoms. She does have some episodes of feeling like she can't catch her breath and others when she feels weak and light headed but they all occur at separate times. She has even had some episodes of wheezing at times but no other symptoms at that time. Denies palp/HA/congestion/fevers/GI or GU c/o. Taking meds as prescribed  Past Medical History:  Diagnosis Date  . 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 Anemia 08/28/2011  . Asthma   . Brain tumor ([place])   . Breast cancer ([place])   . Carpal tunnel syndrome of left wrist   . Constipation 03/03/2012  . DEPRESSION 08/11/2008  . Dermatitis   . Facial fracture ([place])   . GERD 08/11/2008  . H/O measles   . H/O mumps   . History of blood transfusion   . History of chicken pox   . 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 HYPERLIPIDEMIA 08/11/2008  . HYPERTENSION 08/11/2008  . Hyponatremia 08/28/2011  . 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 Interstitial cystitis   . LIPOMA 02/10/2009  . Medicare annual wellness visit, subsequent 12/27/2014   Follows with Dr Amalia Hailey of urology Follows with Dr Posey Pronto at [facility] for opthamology Follows with Dr [name] of dermatology Follows with [facility] gastro[facility]ology, last  scope done in 2011 repeat in 10 years Last [facility] July 2015 should repeat every 1-2 years Last Pap December 2015, repeat in 3 years.   . Melanoma ([place]) 2008   DR. Ronnald Ramp  . Neuropathy ([place])   . NICM (nonischemic cardiomyopathy) ([place]) 03/08/2010   likely 2/2 chemotx - a. Echo 2012: EF 45-50%;  b. Lex MV 2/12:  low risk, apical defect (small area of ischemia vs shifting breast atten);  c.  Echo 7/12: Normal wall thickness, EF 60-65%, normal wall motion, grade 1 diastolic dysfunction, mild LAE, PASP 32;   d. Lex MV 11/13:  EF 76%, no ischemia  . OA (osteoarthritis) 09/07/2011  . Obesity 09/28/2014  . Osteoporosis   . PREDIABETES 06/15/2009  . Shortness of breath   . UTI (lower urinary tract infection) 03/03/2012    Past Surgical History:  Procedure Laterality Date  . BREAST LUMPECTOMY  04/2009   RIGHT FOR BREAST CANCER-CHEMO/RADIATION X 1 YEAR  . BREAST REDUCTION SURGERY Bilateral 05/04/2014   Procedure: MAMMARY REDUCTION  (BREAST);  Surgeon: Cristine Polio, MD;  Location: [place];  Service: Plastics;  Laterality: Bilateral;  . CARPAL TUNNEL RELEASE     L wrist, ulnar nerve moved  . CHOLECYSTECTOMY    . CRANIOTOMY FOR TUMOR  2000  . ELBOW SURGERY  left  . KNEE ARTHROSCOPY Left 10/12/2014   Procedure: LEFT KNEE ARTHROSCOPY ;  Surgeon: Gaynelle Arabian, MD;  [facility];  Service: Orthopedics;  Laterality: Left;  . LEFT HEART CATHETERIZATION WITH CORONARY ANGIOGRAM N/A 12/16/2013   Procedure: LEFT HEART CATHETERIZATION WITH CORONARY ANGIOGRAM;  Surgeon: Peter M [name], MD;  Location: [facility] CATH LAB;  Service: Cardiovascular;  Laterality: N/A;  . LIPOMA EXCISION  03/28/2009   right leg  . MELANOMA EXCISION    . PORT-A-CATH REMOVAL  11/30/2010   Streck  . porta cath    . PORTACATH PLACEMENT  may 2011  . SYNOVECTOMY Left 10/12/2014   Procedure: WITH SYNOVECTOMY;  Surgeon: Gaynelle Arabian, MD;  [facility];  Service: Orthopedics;  Laterality: Left;  . TONSILLECTOMY  1958  .  TOTAL KNEE ARTHROPLASTY  02/05/2012   Procedure: TOTAL KNEE ARTHROPLASTY;  Surgeon: Gearlean Alf, MD;  [facility];  Service: Orthopedics;  Laterality: Right;  . TOTAL KNEE ARTHROPLASTY Left 02/10/2013   Procedure: LEFT TOTAL KNEE ARTHROPLASTY;  Surgeon: Gearlean Alf, MD;  [facility];  Service: Orthopedics;  Laterality: Left;  . total knee raplacement  01-2012   Right Knee  . TUBAL LIGATION  1997    Family History  Problem Relation Age of Onset  . Breast cancer Mother     sarcoma  . Lung cancer Mother   . Hypertension Mother   . Prostate cancer Father   . Congestive Heart Failure Father   . Heart attack Father   . Prostate cancer Brother   . Down syndrome Son   . Heart disease Maternal Grandfather     MI  . Stomach cancer Maternal Aunt   . Uterine cancer Maternal Aunt   . Colon cancer Neg Hx     Social History   Social History  . Marital status: Married    Spouse name: N/A  . Number of children: 3  . Years of education: N/A   Occupational History  . boutique owner [employer]   Social History Main Topics  . Smoking status: Never Smoker  . Smokeless tobacco: Never Used  . Alcohol use No  . Drug use: No  . Sexual activity: No   Other Topics Concern  . Not on file   Social History Narrative  . No narrative on file    Outpatient Medications Prior to Visit  Medication Sig Dispense Refill  . acetaminophen (TYLENOL) 500 MG tablet Take 1,000 mg by mouth every 6 (six) hours as needed for moderate pain or headache.    . alendronate (FOSAMAX) 70 MG tablet Take 1 tablet (70 mg total) by mouth every 7 (seven) days. Take with a full glass of water on an empty stomach. 12 tablet 3  . B Complex-C (SUPER B COMPLEX PO) Take 1 tablet by mouth daily.    . carvedilol (COREG) 12.5 MG tablet Take 1 tablet (12.5 mg total) by mouth 2 (two) times daily with a meal. 180 tablet 1  . Cholecalciferol 2000 UNITS CAPS Take 2,000 Units by mouth daily.    . clonazePAM  (KLONOPIN) 1 MG tablet TAKE 1/2 to 1 TABLET BY MOUTH AT BEDTIME. 30 tablet 5  . folic acid (FOLVITE) 1 MG tablet TAKE ONE TABLET (1 MG TOTAL) BY MOUTH DAILY. 90 tablet 0  . gabapentin (NEURONTIN) 300 MG capsule Take 1 capsule (300 mg total) by mouth 2 (two) times daily. Reported on 05/24/2015 180 capsule 1  . hydrOXYzine (ATARAX/VISTARIL) 25 MG tablet Take  25 mg by mouth at bedtime.    . pentosan polysulfate (ELMIRON) 100 MG capsule Take 200 mg by mouth 2 (two) times daily.    Marland Kitchen RA KRILL OIL 500 MG CAPS Take 1 tablet by mouth daily.    . ranitidine (ZANTAC) 150 MG tablet Take 1 tablet (150 mg total) by mouth 2 (two) times daily as needed for heartburn. 180 tablet 1  . ranitidine (ZANTAC) 150 MG tablet TAKE 1 TABLET (150 MG TOTAL) BY MOUTH 2 (TWO) TIMES DAILY AS NEEDED FOR HEARTBURN. 60 tablet 5  . ranitidine (ZANTAC) 150 MG tablet TAKE 1 TABLET (150 MG TOTAL) BY MOUTH 2 (TWO) TIMES DAILY AS NEEDED FOR HEARTBURN. 60 tablet 5  . telmisartan (MICARDIS) 80 MG tablet TAKE 1 TABLET (80 MG TOTAL) BY MOUTH DAILY. 90 tablet 1  . venlafaxine XR (EFFEXOR-XR) 150 MG 24 hr capsule Take 1 capsule (150 mg total) by mouth daily. 90 capsule 1  . aspirin EC 81 MG tablet Take 81 mg by mouth daily.    Marland Kitchen FERROCITE 324 MG TABS tablet TAKE 1 TABLET (106 MG OF IRON TOTAL) BY MOUTH DAILY. 30 tablet 4  . rosuvastatin (CRESTOR) 20 MG tablet TAKE 1 TABLET (20 MG TOTAL) BY MOUTH AT BEDTIME. 30 tablet 5   No facility-administered medications prior to visit.     Allergies  Allergen Reactions  . Dilaudid [Hydromorphone Hcl] Itching    Review of Systems  Constitutional: Positive for malaise/fatigue. Negative for fever.  HENT: Negative for congestion.   Eyes: Negative for blurred vision.  Respiratory: Negative for shortness of breath.   Cardiovascular: Positive for chest pain. Negative for palpitations and leg swelling.  Gastrointestinal: Negative for abdominal pain, blood in stool and nausea.  Genitourinary: Negative  for dysuria and frequency.  Musculoskeletal: Negative for falls and joint pain.  Skin: Negative for rash.  Neurological: Positive for weakness. Negative for dizziness, loss of consciousness and headaches.  Endo/Heme/Allergies: Negative for environmental allergies.  Psychiatric/Behavioral: Positive for depression.       Objective:    Physical Exam  Constitutional: She is oriented to person, place, and time. She appears well-developed and well-nourished. No distress.  HENT:  Head: Normocephalic and atraumatic.  Nose: Nose normal.  Eyes: Right eye exhibits no discharge. Left eye exhibits no discharge.  Neck: Normal range of motion. Neck supple.  Cardiovascular: Normal rate and regular rhythm.   Pulmonary/Chest: Effort normal and breath sounds normal.  Abdominal: Soft. Bowel sounds are normal. There is no tenderness.  Musculoskeletal: She exhibits no edema.  Neurological: She is alert and oriented to person, place, and time.  Skin: Skin is warm and dry.  Psychiatric: She has a normal mood and affect.  Nursing note and vitals reviewed.   BP 132/80 (BP Location: Left Arm, Patient Position: Sitting, Cuff Size: Large)   Pulse 74   Temp 98 F (36.7 C) (Oral)   Ht '5\' 4"'  (1.626 m)   Wt 215 lb (97.5 kg)   LMP 01/30/1994   SpO2 98%   BMI 36.90 kg/m  Wt Readings from Last 3 Encounters:  11/04/15 215 lb (97.5 kg)  06/21/15 205 lb 4 oz (93.1 kg)  06/14/15 204 lb 12 oz (92.9 kg)     Lab Results  Component Value Date   WBC 6.3 11/04/2015   HGB 11.7 (L) 11/04/2015   HCT 35.2 (L) 11/04/2015   PLT 240.0 11/04/2015   GLUCOSE 87 11/04/2015   CHOL 176 11/04/2015   TRIG 264.0 (H) 11/04/2015  HDL 45.70 11/04/2015   LDLDIRECT 98.0 11/04/2015   LDLCALC 120 (H) 10/10/2013   ALT 25 11/04/2015   AST 26 11/04/2015   NA 143 11/04/2015   K 4.1 11/04/2015   CL 105 11/04/2015   CREATININE 0.85 11/04/2015   BUN 12 11/04/2015   CO2 31 11/04/2015   TSH 2.06 11/04/2015   INR 1.02  12/09/2013   HGBA1C 5.9 11/04/2015    Lab Results  Component Value Date   TSH 2.06 11/04/2015   Lab Results  Component Value Date   WBC 6.3 11/04/2015   HGB 11.7 (L) 11/04/2015   HCT 35.2 (L) 11/04/2015   MCV 86.2 11/04/2015   PLT 240.0 11/04/2015   Lab Results  Component Value Date   NA 143 11/04/2015   K 4.1 11/04/2015   CHLORIDE 107 11/24/2014   CO2 31 11/04/2015   GLUCOSE 87 11/04/2015   BUN 12 11/04/2015   CREATININE 0.85 11/04/2015   BILITOT 0.4 11/04/2015   ALKPHOS 102 11/04/2015   AST 26 11/04/2015   ALT 25 11/04/2015   PROT 7.0 11/04/2015   ALBUMIN 4.0 11/04/2015   CALCIUM 9.3 11/04/2015   ANIONGAP 6 11/24/2014   EGFR 70 (L) 11/24/2014   GFR 69.99 11/04/2015   Lab Results  Component Value Date   CHOL 176 11/04/2015   Lab Results  Component Value Date   HDL 45.70 11/04/2015   Lab Results  Component Value Date   LDLCALC 120 (H) 10/10/2013   Lab Results  Component Value Date   TRIG 264.0 (H) 11/04/2015   Lab Results  Component Value Date   CHOLHDL 4 11/04/2015   Lab Results  Component Value Date   HGBA1C 5.9 11/04/2015       Assessment & Plan:   Problem List Items Addressed This Visit    Hyperlipidemia, mixed    Encouraged heart healthy diet, increase exercise, avoid trans fats, consider a krill oil cap daily. Tolerating Crestor.       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 She is very stressed but she feels she is managing well enough that she does not want to consider medicaitons. She does agree to accept a referral for counseling.      Essential hypertension    Running 676P to 950D  Systolic recently will add the HCTZ 12.5 mg daily      Relevant Medications   nitroGLYCERIN (NITROSTAT) 0.4 MG SL tablet   aspirin EC 81 MG  tablet   hydrochlorothiazide (MICROZIDE) 12.5 MG capsule   Other Relevant Orders   EKG 12-Lead (Completed)   Ambulatory referral to Cardiology   Lipid panel (Completed)   Comprehensive metabolic panel (Completed)   TSH (Completed)   CBC (Completed)   Prediabetes    minimize simple carbs. Increase exercise as tolerated.       Relevant Orders   Ambulatory referral to Cardiology   Hemoglobin A1c (Completed)   Sternal pain    Describes 4-5 times in past 2-3 weeks. Happens with stress and exertion and lasts roughly 5-30 minutes then resolves with rest. No symptoms today. 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 If chest pain recurs and does not resolve will go to ER  Relevant Orders   Ambulatory referral to Cardiology   Obesity    Encouraged DASH diet, decrease po intake and increase exercise as tolerated. Needs 7-8 hours of sleep nightly. Avoid trans fats, eat small, frequent meals every 4-5 hours with lean proteins, complex carbs and healthy fats. Minimize simple carbs      Left leg swelling    Mild and stable       Other Visit Diagnoses    Other chest pain    -  Primary   Relevant Orders   EKG 12-Lead (Completed)   Ambulatory referral to Cardiology   Encounter for immunization       Relevant Medications   Venlafaxine HCl 225 MG TB24   nitroGLYCERIN (NITROSTAT) 0.4 MG SL tablet   aspirin EC 81 MG tablet   hydrochlorothiazide (MICROZIDE) 12.5 MG capsule   Other Relevant Orders   EKG 12-Lead (Completed)   Flu vaccine HIGH DOSE PF (Completed)   Ambulatory referral to Cardiology   Ambulatory referral to Psychology   Other depression       Relevant Medications   Venlafaxine HCl 225 MG TB24   Other Relevant Orders   Ambulatory referral to Psychology   Mixed hyperlipidemia       Relevant Medications   nitroGLYCERIN (NITROSTAT) 0.4 MG SL tablet   aspirin EC 81 MG tablet   hydrochlorothiazide  (MICROZIDE) 12.5 MG capsule   Other Relevant Orders   Lipid panel (Completed)      I have changed Ms. Lute's aspirin EC. I am also having her start on Venlafaxine HCl, nitroGLYCERIN, and hydrochlorothiazide. Additionally, I am having her maintain her hydrOXYzine, pentosan polysulfate, Cholecalciferol, B Complex-C (SUPER B COMPLEX PO), RA KRILL OIL, acetaminophen, telmisartan, gabapentin, carvedilol, venlafaxine XR, clonazePAM, alendronate, ranitidine, ranitidine, folic acid, and ranitidine.  Meds ordered this encounter  Medications  . Venlafaxine HCl 225 MG TB24    Sig: Take 1 tablet (225 mg total) by mouth daily.    Dispense:  30 each    Refill:  3  . nitroGLYCERIN (NITROSTAT) 0.4 MG SL tablet    Sig: Place 1 tablet (0.4 mg total) under the tongue every 5 (five) minutes as needed for chest pain.    Dispense:  50 tablet    Refill:  3  . aspirin EC 81 MG tablet    Sig: Take 2 tablets (162 mg total) by mouth daily.  . hydrochlorothiazide (MICROZIDE) 12.5 MG capsule    Sig: Take 1 capsule (12.5 mg total) by mouth daily.    Dispense:  30 capsule    Refill:  2     Penni Homans, MD

## 2015-11-06 NOTE — Assessment & Plan Note (Signed)
Encouraged heart healthy diet, increase exercise, avoid trans fats, consider a krill oil cap daily. Tolerating Crestor 

## 2015-11-10 ENCOUNTER — Encounter: Payer: Self-pay | Admitting: Cardiology

## 2015-11-14 NOTE — Progress Notes (Signed)
HPI: FU nonischemic cardiomyopathy. Patient had an echocardiogram in 2012 ejection fraction of 45-50%. Reduced LV function felt secondary to chemotherapy (herceptin). Last echocardiogram in July of 2012 showed normal LV function. Patient had a chest CT in October 2015. There was atherosclerosis of the thoracic aorta, great vessels of the mediastinum and the coronary arteries. Nuclear study October 2015 showed an ejection fraction of 64%. There is a small area of moderate apical ischemia report. Cardiac catheterization November 2015 showed no obstructive coronary disease. Ejection fraction 50-55%. Seen recently by primary care with chest pain and we were asked to evaluate. Since last seen, She does have dyspnea on exertion. Occasional mild pedal edema. She has occasional chest pain. It is in various locations on the chest lasting 2-3 minutes. Described as a sharp pain without radiation. Not exertional, pleuritic or positional. No associated symptoms.    Current Outpatient Prescriptions  Medication Sig Dispense Refill  . acetaminophen (TYLENOL) 500 MG tablet Take 1,000 mg by mouth every 6 (six) hours as needed for moderate pain or headache.    . alendronate (FOSAMAX) 70 MG tablet Take 1 tablet (70 mg total) by mouth every 7 (seven) days. Take with a full glass of water on an empty stomach. 12 tablet 3  . aspirin 81 MG tablet Take 81 mg by mouth daily.    . B Complex-C (SUPER B COMPLEX PO) Take 1 tablet by mouth daily.    . carvedilol (COREG) 12.5 MG tablet Take 1 tablet (12.5 mg total) by mouth 2 (two) times daily with a meal. 180 tablet 1  . Cholecalciferol 2000 UNITS CAPS Take 2,000 Units by mouth daily.    . clonazePAM (KLONOPIN) 1 MG tablet TAKE 1/2 to 1 TABLET BY MOUTH AT BEDTIME. 30 tablet 5  . Ferrous Fumarate (FERROCITE) 324 (106 Fe) MG TABS tablet Take 1 tablet (106 mg of iron total) by mouth daily. 90 tablet 3  . folic acid (FOLVITE) 1 MG tablet TAKE ONE TABLET (1 MG TOTAL) BY MOUTH  DAILY. 90 tablet 0  . gabapentin (NEURONTIN) 300 MG capsule Take 1 capsule (300 mg total) by mouth 2 (two) times daily. Reported on 05/24/2015 180 capsule 1  . hydrochlorothiazide (MICROZIDE) 12.5 MG capsule Take 1 capsule (12.5 mg total) by mouth daily. 30 capsule 2  . hydrOXYzine (ATARAX/VISTARIL) 25 MG tablet Take 25 mg by mouth at bedtime.    . nitroGLYCERIN (NITROSTAT) 0.4 MG SL tablet Place 1 tablet (0.4 mg total) under the tongue every 5 (five) minutes as needed for chest pain. 50 tablet 3  . pentosan polysulfate (ELMIRON) 100 MG capsule Take 200 mg by mouth 2 (two) times daily.    Marland Kitchen RA KRILL OIL 500 MG CAPS Take 1 tablet by mouth daily.    . ranitidine (ZANTAC) 150 MG tablet Take 1 tablet (150 mg total) by mouth 2 (two) times daily as needed for heartburn. 180 tablet 1  . rosuvastatin (CRESTOR) 20 MG tablet TAKE 1 TABLET (20 MG TOTAL) BY MOUTH AT BEDTIME. 90 tablet 3  . telmisartan (MICARDIS) 80 MG tablet TAKE 1 TABLET (80 MG TOTAL) BY MOUTH DAILY. 90 tablet 1  . Venlafaxine HCl 225 MG TB24 Take 1 tablet (225 mg total) by mouth daily. 30 each 3  . venlafaxine XR (EFFEXOR-XR) 150 MG 24 hr capsule Take 1 capsule (150 mg total) by mouth daily. 90 capsule 1   No current facility-administered medications for this visit.      Past Medical History:  Diagnosis Date  . 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 Anemia 08/28/2011  . Asthma   . Brain tumor ([place])   . Breast cancer ([place])   . Carpal tunnel syndrome of left wrist   . Constipation 03/03/2012  . DEPRESSION 08/11/2008  . Dermatitis   . Facial fracture ([place])   . GERD 08/11/2008  . H/O measles   . H/O mumps   . History of blood transfusion   . History of chicken pox   . 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 HYPERLIPIDEMIA 08/11/2008  . HYPERTENSION 08/11/2008  . Hyponatremia 08/28/2011  . 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 Interstitial cystitis   .  LIPOMA 02/10/2009  . Medicare annual wellness visit, subsequent 12/27/2014   Follows with Dr Amalia Hailey of urology Follows with Dr Posey Pronto at [facility] for opthamology Follows with Dr [name] of dermatology Follows with [facility]ology, last scope done in 2011 repeat in 10 years Last [facility] July 2015 should repeat every 1-2 years Last Pap December 2015, repeat in 3 years.   . Melanoma ([place]) 2008   DR. Ronnald Ramp  . Neuropathy ([place])   . NICM (nonischemic cardiomyopathy) ([place]) 03/08/2010   likely 2/2 chemotx - a. Echo 2012: EF 45-50%;  b. Lex MV 2/12:  low risk, apical defect (small area of ischemia vs shifting breast atten);  c.  Echo 7/12: Normal wall thickness, EF 60-65%, normal wall motion, grade 1 diastolic dysfunction, mild LAE, PASP 32;   d. Lex MV 11/13:  EF 76%, no ischemia  . OA (osteoarthritis) 09/07/2011  . Obesity 09/28/2014  . Osteoporosis   . PREDIABETES 06/15/2009  . Shortness of breath   . UTI (lower urinary tract infection) 03/03/2012    Past Surgical History:  Procedure Laterality Date  . BREAST LUMPECTOMY  04/2009   RIGHT FOR BREAST CANCER-CHEMO/RADIATION X 1 YEAR  . BREAST REDUCTION SURGERY Bilateral 05/04/2014   Procedure: MAMMARY REDUCTION  (BREAST);  Surgeon: Cristine Polio, MD;  Location: [place];  Service: Plastics;  Laterality: Bilateral;  . CARPAL TUNNEL RELEASE     L wrist, ulnar nerve moved  . CHOLECYSTECTOMY    . CRANIOTOMY FOR TUMOR  2000  . ELBOW SURGERY     left  . KNEE ARTHROSCOPY Left 10/12/2014   Procedure: LEFT KNEE ARTHROSCOPY ;  Surgeon: Gaynelle Arabian, MD;  [facility];  Service: Orthopedics;  Laterality: Left;  . LEFT HEART CATHETERIZATION WITH CORONARY ANGIOGRAM N/A 12/16/2013   Procedure: LEFT HEART CATHETERIZATION WITH CORONARY ANGIOGRAM;  Surgeon: Peter M [name], MD;  Location: [facility] CATH LAB;  Service: Cardiovascular;  Laterality: N/A;  . LIPOMA EXCISION  03/28/2009   right leg  . MELANOMA EXCISION    . PORT-A-CATH REMOVAL  11/30/2010    Streck  . porta cath    . PORTACATH PLACEMENT  may 2011  . SYNOVECTOMY Left 10/12/2014   Procedure: WITH SYNOVECTOMY;  Surgeon: Gaynelle Arabian, MD;  [facility];  Service: Orthopedics;  Laterality: Left;  . TONSILLECTOMY  1958  . TOTAL KNEE ARTHROPLASTY  02/05/2012   Procedure: TOTAL KNEE ARTHROPLASTY;  Surgeon: Gearlean Alf, MD;  [facility];  Service: Orthopedics;  Laterality: Right;  . TOTAL KNEE ARTHROPLASTY Left 02/10/2013   Procedure: LEFT TOTAL KNEE ARTHROPLASTY;  Surgeon: Gearlean Alf, MD;  [facility];  Service: Orthopedics;  Laterality: Left;  . total  knee raplacement  01-2012   Right Knee  . TUBAL LIGATION  1997    Social History   Social History  . Marital status: Married    Spouse name: N/A  . Number of children: 3  . Years of education: N/A   Occupational History  . boutique owner [employer]   Social History Main Topics  . Smoking status: Never Smoker  . Smokeless tobacco: Never Used  . Alcohol use No  . Drug use: No  . Sexual activity: No   Other Topics Concern  . Not on file   Social History Narrative  . No narrative on file    Family History  Problem Relation Age of Onset  . Breast cancer Mother     sarcoma  . Lung cancer Mother   . Hypertension Mother   . Prostate cancer Father   . Congestive Heart Failure Father   . Heart attack Father   . Prostate cancer Brother   . Down syndrome Son   . Heart disease Maternal Grandfather     MI  . Stomach cancer Maternal Aunt   . Uterine cancer Maternal Aunt   . Colon cancer Neg Hx     ROS: no fevers or chills, productive cough, hemoptysis, dysphasia, odynophagia, melena, hematochezia, dysuria, hematuria, rash, seizure activity, orthopnea, PND, pedal edema, claudication. Remaining systems are negative.  Physical Exam: Well-developed obese in no acute distress.  Skin is warm and dry.  HEENT is normal.  Neck is supple.  Chest is clear to auscultation with normal expansion.    Cardiovascular exam is regular rate and rhythm.  Abdominal exam nontender or distended. No masses palpated. Extremities show no edema. neuro grossly intact  ECG 11/04/15-sinus rhythm, cannot rule out prior anterior and inferior infarct.  Echocardiogram today shows sinus rhythm at a rate of 73. Cannot rule out prior inferior infarct. No ST changes.  A/P  1 Hyperlipidemia-continue statin.  2 Hypertension-blood pressure controlled. Continue present medications.  3 nonischemic cardiomyopathy-improved on most recent echocardiogram. Continue beta blocker and ARB.  4 chest pain-Symptoms are atypical. Electrocardiogram shows no ST changes. Previous catheterization showed no obstructive coronary disease. We will not pursue further ischemia evaluation at this point. She does have some increased dyspnea. Plan echocardiogram to assess LV function.  Kirk Ruths, MD

## 2015-11-22 ENCOUNTER — Ambulatory Visit (INDEPENDENT_AMBULATORY_CARE_PROVIDER_SITE_OTHER): Payer: Medicare Other | Admitting: Cardiology

## 2015-11-22 ENCOUNTER — Encounter: Payer: Self-pay | Admitting: Cardiology

## 2015-11-22 VITALS — BP 124/82 | HR 73 | Ht 64.0 in | Wt 214.2 lb

## 2015-11-22 DIAGNOSIS — R0600 Dyspnea, unspecified: Secondary | ICD-10-CM | POA: Diagnosis not present

## 2015-11-22 DIAGNOSIS — R072 Precordial pain: Secondary | ICD-10-CM | POA: Diagnosis not present

## 2015-11-22 NOTE — Patient Instructions (Signed)
Medication Instructions:   NO CHANGE  Testing/Procedures:  Your physician has requested that you have an echocardiogram. Echocardiography is a painless test that uses sound waves to create images of your heart. It provides your doctor with information about the size and shape of your heart and how well your heart's chambers and valves are working. This procedure takes approximately one hour. There are no restrictions for this procedure.    Follow-Up:  Your physician recommends that you schedule a follow-up appointment in: 3 MONTHS WITH DR CRENSHAW      

## 2015-11-29 DIAGNOSIS — Z1379 Encounter for other screening for genetic and chromosomal anomalies: Secondary | ICD-10-CM | POA: Insufficient documentation

## 2015-11-29 NOTE — Progress Notes (Signed)
GENETIC TEST RESULT  HPI: Shannon [name] was previously seen in the [place] [facility] due to a personal history of breast cancer, melanoma, benign craniopharyngioma, family history of breast, stomach, prostate, and other cancers, and concerns regarding a hereditary predisposition to cancer. Please refer to our prior cancer genetics clinic note from September 07, 2015 for more information regarding Shannon [name]'s medical, social and family histories, and our assessment and recommendations, at the time. Shannon [name] recent genetic test results were disclosed to her, as were recommendations warranted by these results. These results and recommendations are discussed in more detail below.  GENETIC TEST RESULTS: At the time of Shannon [name]'s visit on 09/07/15, we recommended she pursue genetic testing of the 34-gene Custom Cancer Panel with MSH2 Exons 1-7 Inversion Analysis through [employer]. This Custom Panel offered by [facility] Shannon Roads, MD) includes sequencing and/or deletion duplication testing of the following 34 genes: APC, ATM, AXIN2, BAP1, BARD1, BMPR1A, BRCA1, BRCA2, BRIP1, CDH1, CDK4, CDKN2A, CHEK2, EPCAM, FANCC, MITF, MLH1, MSH2, MSH6, MUTYH, NBN, PALB2, PMS2, POLD1, POLE, PTEN, RAD51C, RAD51D, SCG5/GREM1, SMAD4, STK11, TP53, VHL, and XRCC2.  Those results are now back, the report date for which is September 24, 2015.  Genetic testing was normal, and did not reveal a deleterious mutation in these genes. Additionally, no variants of uncertain significance (VUSes) were found.  The test report will be scanned into EPIC and will be located under the Results Review tab in the Pathology>Molecular Pathology section.  We discussed with Shannon [name] that since the current genetic testing is not perfect, it is possible there may be a gene mutation in one of these genes that current testing cannot detect, but that chance is small. We also discussed, that it is possible that another gene that has  not yet been discovered, or that we have not yet tested, is responsible for the cancer diagnoses in the family, and it is, therefore, important to remain in touch with cancer genetics in the future so that we can continue to offer Shannon [name] the most up-to-date genetic testing.   CANCER SCREENING RECOMMENDATIONS: While we still do not have an explanation for the personal and family history of cancer, this result may be reassuring and indicate that Shannon [name] likely does not have an increased risk for a future cancer due to a mutation in one of these genes. This normal test also suggests that Shannon [name]'s cancer was most likely not due to an inherited predisposition associated with one of these genes.  Most cancers happen by chance and this negative test suggests that her cancer falls into this category.  We, therefore, recommended she continue to follow the cancer management and screening guidelines provided by her oncology and primary healthcare providers.    RECOMMENDATIONS FOR FAMILY MEMBERS: Men and women in this family might be at some increased risk of developing cancer, over the general population risk, simply due to the family history of cancer. We recommended women in this family have a yearly mammogram beginning at age 71, or 60 years younger than the earliest onset of cancer, an annual clinical breast exam, and perform monthly breast self-exams. Women in this family should also have a gynecological exam as recommended by their primary provider. All family members should have a colonoscopy by age 71.   FOLLOW-UP: Lastly, we discussed with Shannon [name] that cancer genetics is a rapidly advancing field and it is possible that new genetic tests will be appropriate for her and/or her family members  in the future. We encouraged her to remain in contact with cancer genetics on an annual basis so we can update her personal and family histories and let her know of advances in cancer genetics that may benefit  this family.   Our contact number was provided. Shannon [name] questions were answered to her satisfaction, and she knows she is welcome to call us at anytime with additional questions or concerns.   Shannon Luz, MS, [facility] Certified Genetic Counselor [email or web address] Phone: [phone]

## 2015-12-14 ENCOUNTER — Other Ambulatory Visit: Payer: Self-pay

## 2015-12-14 ENCOUNTER — Ambulatory Visit (HOSPITAL_COMMUNITY): Payer: Medicare Other | Attending: Cardiovascular Disease

## 2015-12-14 DIAGNOSIS — I501 Left ventricular failure: Secondary | ICD-10-CM | POA: Insufficient documentation

## 2015-12-14 DIAGNOSIS — R0600 Dyspnea, unspecified: Secondary | ICD-10-CM

## 2015-12-14 DIAGNOSIS — R9439 Abnormal result of other cardiovascular function study: Secondary | ICD-10-CM | POA: Diagnosis not present

## 2015-12-21 DIAGNOSIS — D352 Benign neoplasm of pituitary gland: Secondary | ICD-10-CM | POA: Diagnosis not present

## 2015-12-21 DIAGNOSIS — H524 Presbyopia: Secondary | ICD-10-CM | POA: Diagnosis not present

## 2015-12-21 DIAGNOSIS — H40013 Open angle with borderline findings, low risk, bilateral: Secondary | ICD-10-CM | POA: Diagnosis not present

## 2015-12-21 DIAGNOSIS — H25813 Combined forms of age-related cataract, bilateral: Secondary | ICD-10-CM | POA: Diagnosis not present

## 2015-12-27 ENCOUNTER — Ambulatory Visit: Payer: Medicare Other | Admitting: Family Medicine

## 2015-12-27 ENCOUNTER — Ambulatory Visit: Payer: Medicare Other

## 2015-12-28 ENCOUNTER — Ambulatory Visit (INDEPENDENT_AMBULATORY_CARE_PROVIDER_SITE_OTHER): Payer: Medicare Other | Admitting: Family Medicine

## 2015-12-28 ENCOUNTER — Encounter: Payer: Self-pay | Admitting: Family Medicine

## 2015-12-28 VITALS — BP 148/90 | HR 77 | Temp 98.1°F | Wt 215.4 lb

## 2015-12-28 DIAGNOSIS — Z23 Encounter for immunization: Secondary | ICD-10-CM

## 2015-12-28 DIAGNOSIS — K2100 Gastro-esophageal reflux disease with esophagitis, without bleeding: Secondary | ICD-10-CM

## 2015-12-28 DIAGNOSIS — H269 Unspecified cataract: Secondary | ICD-10-CM | POA: Diagnosis not present

## 2015-12-28 DIAGNOSIS — Z0000 Encounter for general adult medical examination without abnormal findings: Secondary | ICD-10-CM | POA: Diagnosis not present

## 2015-12-28 DIAGNOSIS — F418 Other specified anxiety disorders: Secondary | ICD-10-CM | POA: Diagnosis not present

## 2015-12-28 DIAGNOSIS — I10 Essential (primary) hypertension: Secondary | ICD-10-CM | POA: Diagnosis not present

## 2015-12-28 DIAGNOSIS — E782 Mixed hyperlipidemia: Secondary | ICD-10-CM

## 2015-12-28 DIAGNOSIS — R296 Repeated falls: Secondary | ICD-10-CM | POA: Insufficient documentation

## 2015-12-28 HISTORY — DX: Unspecified cataract: H26.9

## 2015-12-28 HISTORY — DX: Repeated falls: R29.6

## 2015-12-28 MED ORDER — FOLIC ACID 1 MG PO TABS: ORAL_TABLET | ORAL | 3 refills | Status: DC

## 2015-12-28 MED ORDER — CLONAZEPAM 1 MG PO TABS: ORAL_TABLET | ORAL | 5 refills | Status: DC

## 2015-12-28 MED ORDER — CARVEDILOL 25 MG PO TABS: 25.0000 mg | ORAL_TABLET | Freq: Two times a day (BID) | ORAL | 0 refills | Status: DC

## 2015-12-28 MED ORDER — GABAPENTIN 300 MG PO CAPS
300.0000 mg | ORAL_CAPSULE | Freq: Two times a day (BID) | ORAL | 1 refills | Status: DC
Start: 2015-12-28 — End: 2016-08-12

## 2015-12-28 NOTE — Assessment & Plan Note (Signed)
Follows with Dr Bing Plume they are currently monitoring her cataracts, no surgery scheduled

## 2015-12-28 NOTE — Assessment & Plan Note (Signed)
Patient denies any difficulties at home. No trouble with ADLs, depression or falls. See EMR for functional status screen and depression screen. No recent changes to vision or hearing. Is UTD with immunizations. Is UTD with screening. Discussed Advanced Directives. Encouraged heart healthy diet, exercise as tolerated and adequate sleep. See patient's problem list for health risk factors to monitor. See AVS for preventative healthcare recommendation schedule. Patient with HCP and living will agrees to bring it in.

## 2015-12-28 NOTE — Assessment & Plan Note (Signed)
Tolerating statin, encouraged heart healthy diet, avoid trans fats, minimize simple carbs and saturated fats. Increase exercise as tolerated, tolerating Crestor

## 2015-12-28 NOTE — Assessment & Plan Note (Signed)
Avoid offending foods, start probiotics. Do not eat large meals in late evening and consider raising head of bed.  

## 2015-12-28 NOTE — Progress Notes (Signed)
Patient ID: Shannon [name], female   DOB: December 19, 1945, 70 y.o.   MRN: [id number]   Subjective:    Patient ID: Shannon [name], female    DOB: [birth year], 70 y.o.   MRN: [id number]  Chief Complaint  Patient presents with  . medicare well visit    HPI Patient is in today for a medicare wellness visit, she is struggling with anhedonia, anxiety and depression due to the worsening health of her husband and son. She endorses fatigue and myalgias but not suicidal ideation. She reports performing ADLs well but has fallen twice since her last visit. Notes contusion/abrasion but no broken bones. No recent acute illness or hospitalizations. Denies CP/palp/SOB/HA/congestion/fevers/GI or GU c/o. Taking meds as prescribed  Past Medical History:  Diagnosis Date  . 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 Anemia 08/28/2011  . Asthma   . Brain tumor ([place])   . Breast cancer ([place])   . Carpal tunnel syndrome of left wrist   . Cataracts, bilateral 12/28/2015  . Constipation 03/03/2012  . DEPRESSION 08/11/2008  . Dermatitis   . Facial fracture ([place])   . GERD 08/11/2008  . H/O measles   . H/O mumps   . History of blood transfusion   . History of chicken pox   . 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 HYPERLIPIDEMIA 08/11/2008  . HYPERTENSION 08/11/2008  . Hyponatremia 08/28/2011  . 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 Interstitial cystitis   . LIPOMA 02/10/2009  . Medicare annual wellness visit, subsequent 12/27/2014   Follows with Dr Amalia Hailey of urology Follows with Dr Posey Pronto at [facility] for opthamology Follows with Dr [name] of dermatology Follows with [facility] gastro[facility]ology, last scope done in 2011 repeat in 10 years Last [facility] July 2015 should repeat every 1-2 years Last Pap December 2015, repeat in 3 years.   . Melanoma ([place]) 2008   DR. Ronnald Ramp  . Neuropathy ([place])   . NICM (nonischemic cardiomyopathy) ([place]) 03/08/2010   likely 2/2 chemotx  - a. Echo 2012: EF 45-50%;  b. Lex MV 2/12:  low risk, apical defect (small area of ischemia vs shifting breast atten);  c.  Echo 7/12: Normal wall thickness, EF 60-65%, normal wall motion, grade 1 diastolic dysfunction, mild LAE, PASP 32;   d. Lex MV 11/13:  EF 76%, no ischemia  . OA (osteoarthritis) 09/07/2011  . Obesity 09/28/2014  . Osteoporosis   . PREDIABETES 06/15/2009  . Recurrent falls 12/28/2015  . Shortness of breath   . UTI (lower urinary tract infection) 03/03/2012    Past Surgical History:  Procedure Laterality Date  . BREAST LUMPECTOMY  04/2009   RIGHT FOR BREAST CANCER-CHEMO/RADIATION X 1 YEAR  . BREAST REDUCTION SURGERY Bilateral 05/04/2014   Procedure: MAMMARY REDUCTION  (BREAST);  Surgeon: Cristine Polio, MD;  Location: [facility];  Service: Plastics;  Laterality: Bilateral;  . CARPAL TUNNEL RELEASE     L wrist, ulnar nerve moved  . CHOLECYSTECTOMY    . CRANIOTOMY FOR TUMOR  2000  . ELBOW SURGERY     left  . KNEE ARTHROSCOPY Left 10/12/2014   Procedure: LEFT KNEE ARTHROSCOPY ;  Surgeon: Gaynelle Arabian, MD;  Location: [facility];  Service: Orthopedics;  Laterality: Left;  . LEFT HEART CATHETERIZATION WITH CORONARY ANGIOGRAM N/A 12/16/2013   Procedure: LEFT HEART CATHETERIZATION WITH CORONARY ANGIOGRAM;  Surgeon: Peter M [name], MD;  Location:  [facility];  Service: Cardiovascular;  Laterality: N/A;  . LIPOMA EXCISION  03/28/2009   right leg  . MELANOMA EXCISION    . PORT-A-CATH REMOVAL  11/30/2010   [place]  . porta cath    . PORTACATH PLACEMENT  may 2011  . SYNOVECTOMY Left 10/12/2014   Procedure: WITH SYNOVECTOMY;  Surgeon: Gaynelle Arabian, MD;  Location: [facility];  Service: Orthopedics;  Laterality: Left;  . TONSILLECTOMY  1958  . TOTAL KNEE ARTHROPLASTY  02/05/2012   Procedure: TOTAL KNEE ARTHROPLASTY;  Surgeon: Gearlean Alf, MD;  Location: [facility];  Service: Orthopedics;  Laterality: Right;  . TOTAL KNEE ARTHROPLASTY Left 02/10/2013   Procedure: LEFT TOTAL  KNEE ARTHROPLASTY;  Surgeon: Gearlean Alf, MD;  Location: [facility];  Service: Orthopedics;  Laterality: Left;  . total knee raplacement  01-2012   Right Knee  . TUBAL LIGATION  1997    Family History  Problem Relation Age of Onset  . Breast cancer Mother     sarcoma  . Lung cancer Mother   . Hypertension Mother   . Prostate cancer Father   . Congestive Heart Failure Father   . Heart attack Father   . Prostate cancer Brother   . Down syndrome Son   . Heart disease Maternal Grandfather     MI  . Stomach cancer Maternal Aunt   . Uterine cancer Maternal Aunt   . Colon cancer Neg Hx     Social History   Social History  . Marital status: Married    Spouse name: N/A  . Number of children: 3  . Years of education: N/A   Occupational History  . boutique owner [employer]   Social History Main Topics  . Smoking status: Never Smoker  . Smokeless tobacco: Never Used  . Alcohol use No  . Drug use: No  . Sexual activity: No   Other Topics Concern  . Not on file   Social History Narrative  . No narrative on file    Outpatient Medications Prior to Visit  Medication Sig Dispense Refill  . Venlafaxine HCl 225 MG TB24 Take 1 tablet (225 mg total) by mouth daily. 30 each 3  . acetaminophen (TYLENOL) 500 MG tablet Take 1,000 mg by mouth every 6 (six) hours as needed for moderate pain or headache.    . alendronate (FOSAMAX) 70 MG tablet Take 1 tablet (70 mg total) by mouth every 7 (seven) days. Take with a full glass of water on an empty stomach. (Patient not taking: Reported on 12/28/2015) 12 tablet 3  . aspirin 81 MG tablet Take 81 mg by mouth daily.    . B Complex-C (SUPER B COMPLEX PO) Take 1 tablet by mouth daily.    . Cholecalciferol 2000 UNITS CAPS Take 2,000 Units by mouth daily.    . Ferrous Fumarate (FERROCITE) 324 (106 Fe) MG TABS tablet Take 1 tablet (106 mg of iron total) by mouth daily. 90 tablet 3  . hydrochlorothiazide (MICROZIDE) 12.5 MG capsule Take 1  capsule (12.5 mg total) by mouth daily. 30 capsule 2  . hydrOXYzine (ATARAX/VISTARIL) 25 MG tablet Take 25 mg by mouth at bedtime.    . nitroGLYCERIN (NITROSTAT) 0.4 MG SL tablet Place 1 tablet (0.4 mg total) under the tongue every 5 (five) minutes as needed for chest pain. 50 tablet 3  . pentosan polysulfate (ELMIRON) 100 MG capsule Take 200 mg by mouth 2 (two) times daily.    Marland Kitchen RA KRILL OIL 500  MG CAPS Take 1 tablet by mouth daily.    . ranitidine (ZANTAC) 150 MG tablet Take 1 tablet (150 mg total) by mouth 2 (two) times daily as needed for heartburn. 180 tablet 1  . rosuvastatin (CRESTOR) 20 MG tablet TAKE 1 TABLET (20 MG TOTAL) BY MOUTH AT BEDTIME. 90 tablet 3  . telmisartan (MICARDIS) 80 MG tablet TAKE 1 TABLET (80 MG TOTAL) BY MOUTH DAILY. 90 tablet 1  . carvedilol (COREG) 12.5 MG tablet Take 1 tablet (12.5 mg total) by mouth 2 (two) times daily with a meal. 180 tablet 1  . clonazePAM (KLONOPIN) 1 MG tablet TAKE 1/2 to 1 TABLET BY MOUTH AT BEDTIME. 30 tablet 5  . folic acid (FOLVITE) 1 MG tablet TAKE ONE TABLET (1 MG TOTAL) BY MOUTH DAILY. 90 tablet 0  . gabapentin (NEURONTIN) 300 MG capsule Take 1 capsule (300 mg total) by mouth 2 (two) times daily. Reported on 05/24/2015 180 capsule 1  . venlafaxine XR (EFFEXOR-XR) 150 MG 24 hr capsule Take 1 capsule (150 mg total) by mouth daily. 90 capsule 1   No facility-administered medications prior to visit.     Allergies  Allergen Reactions  . Dilaudid [Hydromorphone Hcl] Itching    Review of Systems  Constitutional: Positive for malaise/fatigue. Negative for fever.  Eyes: Negative for blurred vision.  Respiratory: Negative for cough and shortness of breath.   Cardiovascular: Negative for chest pain and palpitations.  Gastrointestinal: Negative for vomiting.  Musculoskeletal: Positive for myalgias.  Skin: Negative for rash.  Neurological: Negative for loss of consciousness and headaches.  Psychiatric/Behavioral: Positive for depression.  Negative for hallucinations, memory loss, substance abuse and suicidal ideas. The patient is nervous/anxious.        Objective:    Physical Exam  Constitutional: She is oriented to person, place, and time. She appears well-developed and well-nourished. No distress.  HENT:  Head: Normocephalic and atraumatic.  Eyes: Conjunctivae are normal.  Neck: Normal range of motion. No thyromegaly present.  Cardiovascular: Normal rate and regular rhythm.   Pulmonary/Chest: Effort normal and breath sounds normal. She has no wheezes.  Abdominal: Soft. Bowel sounds are normal. There is no tenderness.  Musculoskeletal: Normal range of motion. She exhibits no edema or deformity.  Neurological: She is alert and oriented to person, place, and time.  Skin: Skin is warm and dry. She is not diaphoretic.  Psychiatric: She has a normal mood and affect.    BP (!) 148/90   Pulse 77   Temp 98.1 F (36.7 C) (Oral)   Wt 215 lb 6.4 oz (97.7 kg)   LMP 01/30/1994   SpO2 96% Comment: RA  BMI 36.97 kg/m  Wt Readings from Last 3 Encounters:  12/28/15 215 lb 6.4 oz (97.7 kg)  11/22/15 214 lb 3.2 oz (97.2 kg)  11/04/15 215 lb (97.5 kg)     Lab Results  Component Value Date   WBC 6.3 11/04/2015   HGB 11.7 (L) 11/04/2015   HCT 35.2 (L) 11/04/2015   PLT 240.0 11/04/2015   GLUCOSE 87 11/04/2015   CHOL 176 11/04/2015   TRIG 264.0 (H) 11/04/2015   HDL 45.70 11/04/2015   LDLDIRECT 98.0 11/04/2015   LDLCALC 120 (H) 10/10/2013   ALT 25 11/04/2015   AST 26 11/04/2015   NA 143 11/04/2015   K 4.1 11/04/2015   CL 105 11/04/2015   CREATININE 0.85 11/04/2015   BUN 12 11/04/2015   CO2 31 11/04/2015   TSH 2.06 11/04/2015   INR 4.0 12/28/2015  HGBA1C 5.9 11/04/2015    Lab Results  Component Value Date   TSH 2.06 11/04/2015   Lab Results  Component Value Date   WBC 6.3 11/04/2015   HGB 11.7 (L) 11/04/2015   HCT 35.2 (L) 11/04/2015   MCV 86.2 11/04/2015   PLT 240.0 11/04/2015   Lab Results    Component Value Date   NA 143 11/04/2015   K 4.1 11/04/2015   CHLORIDE 107 11/24/2014   CO2 31 11/04/2015   GLUCOSE 87 11/04/2015   BUN 12 11/04/2015   CREATININE 0.85 11/04/2015   BILITOT 0.4 11/04/2015   ALKPHOS 102 11/04/2015   AST 26 11/04/2015   ALT 25 11/04/2015   PROT 7.0 11/04/2015   ALBUMIN 4.0 11/04/2015   CALCIUM 9.3 11/04/2015   ANIONGAP 6 11/24/2014   EGFR 70 (L) 11/24/2014   GFR 69.99 11/04/2015   Lab Results  Component Value Date   CHOL 176 11/04/2015   Lab Results  Component Value Date   HDL 45.70 11/04/2015   Lab Results  Component Value Date   LDLCALC 120 (H) 10/10/2013   Lab Results  Component Value Date   TRIG 264.0 (H) 11/04/2015   Lab Results  Component Value Date   CHOLHDL 4 11/04/2015   Lab Results  Component Value Date   HGBA1C 5.9 11/04/2015       Assessment & Plan:   Problem List Items Addressed This Visit    Hyperlipidemia, mixed    Tolerating statin, encouraged heart healthy diet, avoid trans fats, minimize simple carbs and saturated fats. Increase exercise as tolerated, tolerating Crestor      Relevant Medications   carvedilol (COREG) 25 MG tablet   Depression with anxiety    Continues to struggle with her husband and son's worsening health concerns. All the work at home now falls to her. She acknowledges she never went up on her Venlafaxine to 225 due to the pharmacy dispensing the wrong dose. She agrees to proceed now and encouraged to start counseling. gien contact infor      Essential hypertension    Well controlled, no changes to meds. Encouraged heart healthy diet such as the DASH diet and exercise as tolerated.       Relevant Medications   carvedilol (COREG) 25 MG tablet   Gastro-esophageal reflux    Avoid offending foods, start probiotics. Do not eat large meals in late evening and consider raising head of bed.       Medicare annual wellness visit, subsequent    Patient denies any difficulties at home. No  trouble with ADLs, depression or falls. See EMR for functional status screen and depression screen. No recent changes to vision or hearing. Is UTD with immunizations. Is UTD with screening. Discussed Advanced Directives. Encouraged heart healthy diet, exercise as tolerated and adequate sleep. See patient's problem list for health risk factors to monitor. See AVS for preventative healthcare recommendation schedule. Patient with HCP and living will agrees to bring it in.      Cataracts, bilateral    Follows with Dr Bing Plume they are currently monitoring her cataracts, no surgery scheduled      Recurrent falls    2 falls with contusion/abrasion. Offered PT and declines. Given Td today due to injuries.          I have discontinued Ms. Dorfman's carvedilol and venlafaxine XR. I am also having her start on carvedilol. Additionally, I am having her maintain her hydrOXYzine, pentosan polysulfate, Cholecalciferol, B Complex-C (SUPER B COMPLEX  PO), RA KRILL OIL, acetaminophen, telmisartan, alendronate, ranitidine, Venlafaxine HCl, nitroGLYCERIN, hydrochlorothiazide, Ferrous Fumarate, rosuvastatin, aspirin, gabapentin, clonazePAM, and folic acid.  Meds ordered this encounter  Medications  . gabapentin (NEURONTIN) 300 MG capsule    Sig: Take 1 capsule (300 mg total) by mouth 2 (two) times daily. Reported on 05/24/2015    Dispense:  180 capsule    Refill:  1  . clonazePAM (KLONOPIN) 1 MG tablet    Sig: TAKE 1/2 to 1 TABLET BY MOUTH AT BEDTIME.    Dispense:  30 tablet    Refill:  5    Not to exceed 5 additional fills before 49/75/3005  . folic acid (FOLVITE) 1 MG tablet    Sig: TAKE ONE TABLET (1 MG TOTAL) BY MOUTH DAILY.    Dispense:  90 tablet    Refill:  3  . carvedilol (COREG) 25 MG tablet    Sig: Take 1 tablet (25 mg total) by mouth 2 (two) times daily with a meal.    Dispense:  180 tablet    Refill:  0   Seen and examined with CMA who served as a Education administrator during this encounter.  Penni Homans, MD

## 2015-12-28 NOTE — Progress Notes (Signed)
Pre visit review using our clinic review tool, if applicable. No additional management support is needed unless otherwise documented below in the visit note. 

## 2015-12-28 NOTE — Assessment & Plan Note (Signed)
Well controlled, no changes to meds. Encouraged heart healthy diet such as the DASH diet and exercise as tolerated.  °

## 2015-12-28 NOTE — Patient Instructions (Addendum)
Double check MG on the Effexor make sure its 233m  Aspercreme twice a day for leg swelling  Preventive Care 71 Years and Older, Female Preventive care refers to lifestyle choices and visits with your health care provider that can promote health and wellness. What does preventive care include?  A yearly physical exam. This is also called an annual well check.  Dental exams once or twice a year.  Routine eye exams. Ask your health care provider how often you should have your eyes checked.  Personal lifestyle choices, including:  Daily care of your teeth and gums.  Regular physical activity.  Eating a healthy diet.  Avoiding tobacco and drug use.  Limiting alcohol use.  Practicing safe sex.  Taking low-dose aspirin every day.  Taking vitamin and mineral supplements as recommended by your health care provider. What happens during an annual well check? The services and screenings done by your health care provider during your annual well check will depend on your age, overall health, lifestyle risk factors, and family history of disease. Counseling  Your health care provider may ask you questions about your:  Alcohol use.  Tobacco use.  Drug use.  Emotional well-being.  Home and relationship well-being.  Sexual activity.  Eating habits.  History of falls.  Memory and ability to understand (cognition).  Work and work eStatistician  Reproductive health. Screening  You may have the following tests or measurements:  Height, weight, and BMI.  Blood pressure.  Lipid and cholesterol levels. These may be checked every 5 years, or more frequently if you are over 7106years old.  Skin check.  Lung cancer screening. You may have this screening every year starting at age 713if you have a 30-pack-year history of smoking and currently smoke or have quit within the past 15 years.  Fecal occult blood test (FOBT) of the stool. You may have this test every year starting at  age 71  Flexible sigmoidoscopy or colonoscopy. You may have a sigmoidoscopy every 5 years or a colonoscopy every 10 years starting at age 71  Hepatitis C blood test.  Hepatitis B blood test.  Sexually transmitted disease (STD) testing.  Diabetes screening. This is done by checking your blood sugar (glucose) after you have not eaten for a while (fasting). You may have this done every 1-3 years.  Bone density scan. This is done to screen for osteoporosis. You may have this done starting at age 71  Mammogram. This may be done every 1-2 years. Talk to your health care provider about how often you should have regular mammograms. Talk with your health care provider about your test results, treatment options, and if necessary, the need for more tests. Vaccines  Your health care provider may recommend certain vaccines, such as:  Influenza vaccine. This is recommended every year.  Tetanus, diphtheria, and acellular pertussis (Tdap, Td) vaccine. You may need a Td booster every 10 years.  Varicella vaccine. You may need this if you have not been vaccinated.  Zoster vaccine. You may need this after age 71  Measles, mumps, and rubella (MMR) vaccine. You may need at least one dose of MMR if you were born in 1957 or later. You may also need a second dose.  Pneumococcal 13-valent conjugate (PCV13) vaccine. One dose is recommended after age 71  Pneumococcal polysaccharide (PPSV23) vaccine. One dose is recommended after age 71105  Meningococcal vaccine. You may need this if you have certain conditions.  Hepatitis A vaccine. You may need this  if you have certain conditions or if you travel or work in places where you may be exposed to hepatitis A.  Hepatitis B vaccine. You may need this if you have certain conditions or if you travel or work in places where you may be exposed to hepatitis B.  Haemophilus influenzae type b (Hib) vaccine. You may need this if you have certain conditions. Talk  to your health care provider about which screenings and vaccines you need and how often you need them. This information is not intended to replace advice given to you by your health care provider. Make sure you discuss any questions you have with your health care provider. Document Released: 02/12/2015 Document Revised: 10/06/2015 Document Reviewed: 11/17/2014 Elsevier Interactive Patient Education  2017 [employer].

## 2015-12-28 NOTE — Assessment & Plan Note (Signed)
2 falls with contusion/abrasion. Offered PT and declines. Given Td today due to injuries.

## 2015-12-28 NOTE — Assessment & Plan Note (Signed)
Continues to struggle with her husband and son's worsening health concerns. All the work at home now falls to her. She acknowledges she never went up on her Venlafaxine to 225 due to the pharmacy dispensing the wrong dose. She agrees to proceed now and encouraged to start counseling. gien contact infor

## 2015-12-31 NOTE — Progress Notes (Signed)
Order(s) created erroneously. Erroneous order ID: [id number]  Order moved by: Berneice Gandy  Order move date/time: 12/31/2015 10:37 AM  Source Patient: [id number]  Source Contact: 12/28/2015  Destination Patient: [id number]  Destination Contact: 04/16/2012

## 2016-01-04 ENCOUNTER — Telehealth: Payer: Self-pay | Admitting: *Deleted

## 2016-01-04 NOTE — Telephone Encounter (Signed)
Received PA request from [facility] pharmacy for Venlafaxine HCI ER 225 mg tablet; Initiated via Cover My Meds [Key: [id number]], awaiting approval response/SLS 12/05

## 2016-01-05 ENCOUNTER — Other Ambulatory Visit: Payer: Self-pay | Admitting: Family Medicine

## 2016-01-05 ENCOUNTER — Telehealth: Payer: Self-pay | Admitting: Family Medicine

## 2016-01-05 NOTE — Telephone Encounter (Signed)
Prior Authorization initiated via Cover My Meds on 01/04/16 for Venlafaxine 225 mg [px 11/04/15], awaiting response; Please Advise/SLS 12/06

## 2016-01-05 NOTE — Telephone Encounter (Signed)
12/28/15 PR PPPS, SUBSEQ VISIT [id number] with Provider, LVM advising patient to schedule new year medicare wellness.

## 2016-01-05 NOTE — Telephone Encounter (Signed)
Refill sent per [facility] refill protocol/SLS  

## 2016-01-06 NOTE — Telephone Encounter (Signed)
Received PA Approval and is good until 01/29/2017. Pharmacy informed of approval.  Copied approval letter/mailed a copy to patient/put original to scan.

## 2016-01-07 NOTE — Telephone Encounter (Signed)
Medication has been approved. Letter of approval mailed to the patient/copied/scanned to chart.

## 2016-01-11 ENCOUNTER — Ambulatory Visit (INDEPENDENT_AMBULATORY_CARE_PROVIDER_SITE_OTHER): Payer: Medicare Other | Admitting: Family Medicine

## 2016-01-11 VITALS — BP 96/63 | HR 76

## 2016-01-11 DIAGNOSIS — I10 Essential (primary) hypertension: Secondary | ICD-10-CM | POA: Diagnosis not present

## 2016-01-11 NOTE — Progress Notes (Signed)
RN blood pressure check note reviewed. Agree with documention and plan. 

## 2016-01-11 NOTE — Progress Notes (Signed)
Pre visit review using our clinic review tool, if applicable. No additional management support is needed unless otherwise documented below in the visit note.  Patient presents in clinic today for blood pressure check. Reviewed medications & current regimen. Patient is asymptomatic at this time. Readings were BP 96/63 & P 76.  Per Dr. Charlett Blake: Continue current medication regimen. Stay well hydrated. Report any symptoms (e.g. headache, dizziness and etc.). Keep follow-up appointment with PCP on 04/10/16.  Informed patient of the provider's instructions. She verbalized understanding and did not have any further concerns before leaving the nurse visit.   RN blood check note reviewed. Agree with documention and plan.  Penni Homans, MD

## 2016-01-11 NOTE — Patient Instructions (Addendum)
Per Dr. Charlett Blake: Continue current medication regimen. Stay well hydrated. Report any symptoms (e.g. headache, dizziness and etc.). Keep follow-up appointment with PCP on 04/10/16.

## 2016-02-16 NOTE — Progress Notes (Deleted)
HPI: FU nonischemic cardiomyopathy. Patient had an echocardiogram in 2012 ejection fraction of 45-50%. Reduced LV function felt secondary to chemotherapy (herceptin). Patient had a chest CT in October 2015. There was atherosclerosis of the thoracic aorta, great vessels of the mediastinum and the coronary arteries. Nuclear study October 2015 showed an ejection fraction of 64%. There is a small area of moderate apical ischemia report. Cardiac catheterization November 2015 showed no obstructive coronary disease. Ejection fraction 50-55%. Echocardiogram November 2017 showed ejection fraction 45-80%, grade 2 diastolic dysfunction and mild left atrial enlargement. Since last seen,   Current Outpatient Prescriptions  Medication Sig Dispense Refill  . acetaminophen (TYLENOL) 500 MG tablet Take 1,000 mg by mouth every 6 (six) hours as needed for moderate pain or headache.    . alendronate (FOSAMAX) 70 MG tablet Take 1 tablet (70 mg total) by mouth every 7 (seven) days. Take with a full glass of water on an empty stomach. (Patient not taking: Reported on 12/28/2015) 12 tablet 3  . aspirin 81 MG tablet Take 81 mg by mouth daily.    . B Complex-C (SUPER B COMPLEX PO) Take 1 tablet by mouth daily.    . carvedilol (COREG) 25 MG tablet Take 1 tablet (25 mg total) by mouth 2 (two) times daily with a meal. 180 tablet 0  . Cholecalciferol 2000 UNITS CAPS Take 2,000 Units by mouth daily.    . clonazePAM (KLONOPIN) 1 MG tablet TAKE 1/2 to 1 TABLET BY MOUTH AT BEDTIME. 30 tablet 5  . Ferrous Fumarate (FERROCITE) 324 (106 Fe) MG TABS tablet Take 1 tablet (106 mg of iron total) by mouth daily. 90 tablet 3  . folic acid (FOLVITE) 1 MG tablet TAKE ONE TABLET (1 MG TOTAL) BY MOUTH DAILY. 90 tablet 3  . gabapentin (NEURONTIN) 300 MG capsule Take 1 capsule (300 mg total) by mouth 2 (two) times daily. Reported on 05/24/2015 180 capsule 1  . hydrochlorothiazide (MICROZIDE) 12.5 MG capsule Take 1 capsule (12.5 mg total) by  mouth daily. 30 capsule 2  . hydrOXYzine (ATARAX/VISTARIL) 25 MG tablet Take 25 mg by mouth at bedtime.    . nitroGLYCERIN (NITROSTAT) 0.4 MG SL tablet Place 1 tablet (0.4 mg total) under the tongue every 5 (five) minutes as needed for chest pain. 50 tablet 3  . pentosan polysulfate (ELMIRON) 100 MG capsule Take 200 mg by mouth 2 (two) times daily.    Marland Kitchen RA KRILL OIL 500 MG CAPS Take 1 tablet by mouth daily.    . ranitidine (ZANTAC) 150 MG tablet Take 1 tablet (150 mg total) by mouth 2 (two) times daily as needed for heartburn. 180 tablet 1  . rosuvastatin (CRESTOR) 20 MG tablet TAKE 1 TABLET (20 MG TOTAL) BY MOUTH AT BEDTIME. 90 tablet 3  . telmisartan (MICARDIS) 80 MG tablet TAKE 1 TABLET (80 MG TOTAL) BY MOUTH DAILY. 90 tablet 1  . Venlafaxine HCl 225 MG TB24 Take 1 tablet (225 mg total) by mouth daily. 30 each 3   No current facility-administered medications for this visit.      Past Medical History:  Diagnosis Date  . 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 Anemia 08/28/2011  . Asthma   . Brain tumor ([place])   . Breast cancer ([place])   . Carpal tunnel syndrome of left wrist   . Cataracts, bilateral 12/28/2015  . Constipation 03/03/2012  . DEPRESSION 08/11/2008  . Dermatitis   . Facial fracture ([place])   .  GERD 08/11/2008  . H/O measles   . H/O mumps   . History of blood transfusion   . History of chicken pox   . 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 HYPERLIPIDEMIA 08/11/2008  . HYPERTENSION 08/11/2008  . Hyponatremia 08/28/2011  . 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 Interstitial cystitis   . LIPOMA 02/10/2009  . Medicare annual wellness visit, subsequent 12/27/2014   Follows with Dr Amalia Hailey of urology Follows with Dr Posey Pronto at [facility] for opthamology Follows with Dr [name] of dermatology Follows with [facility]ology, last scope done in 2011 repeat in 10 years Last [facility] July 2015 should repeat every  1-2 years Last Pap December 2015, repeat in 3 years.   . Melanoma ([place]) 2008   DR. Ronnald Ramp  . Neuropathy ([place])   . NICM (nonischemic cardiomyopathy) ([place]) 03/08/2010   likely 2/2 chemotx - a. Echo 2012: EF 45-50%;  b. Lex MV 2/12:  low risk, apical defect (small area of ischemia vs shifting breast atten);  c.  Echo 7/12: Normal wall thickness, EF 60-65%, normal wall motion, grade 1 diastolic dysfunction, mild LAE, PASP 32;   d. Lex MV 11/13:  EF 76%, no ischemia  . OA (osteoarthritis) 09/07/2011  . Obesity 09/28/2014  . Osteoporosis   . PREDIABETES 06/15/2009  . Recurrent falls 12/28/2015  . Shortness of breath   . UTI (lower urinary tract infection) 03/03/2012    Past Surgical History:  Procedure Laterality Date  . BREAST LUMPECTOMY  04/2009   RIGHT FOR BREAST CANCER-CHEMO/RADIATION X 1 YEAR  . BREAST REDUCTION SURGERY Bilateral 05/04/2014   Procedure: MAMMARY REDUCTION  (BREAST);  Surgeon: Cristine Polio, MD;  Location: [facility];  Service: Plastics;  Laterality: Bilateral;  . CARPAL TUNNEL RELEASE     L wrist, ulnar nerve moved  . CHOLECYSTECTOMY    . CRANIOTOMY FOR TUMOR  2000  . ELBOW SURGERY     left  . KNEE ARTHROSCOPY Left 10/12/2014   Procedure: LEFT KNEE ARTHROSCOPY ;  Surgeon: Gaynelle Arabian, MD;  Location: [facility];  Service: Orthopedics;  Laterality: Left;  . LEFT HEART CATHETERIZATION WITH CORONARY ANGIOGRAM N/A 12/16/2013   Procedure: LEFT HEART CATHETERIZATION WITH CORONARY ANGIOGRAM;  Surgeon: Peter M [name], MD;  Location: [facility] CATH LAB;  Service: Cardiovascular;  Laterality: N/A;  . LIPOMA EXCISION  03/28/2009   right leg  . MELANOMA EXCISION    . PORT-A-CATH REMOVAL  11/30/2010   [place]  . porta cath    . PORTACATH PLACEMENT  may 2011  . SYNOVECTOMY Left 10/12/2014   Procedure: WITH SYNOVECTOMY;  Surgeon: Gaynelle Arabian, MD;  Location: [facility];  Service: Orthopedics;  Laterality: Left;  . TONSILLECTOMY  1958  . TOTAL KNEE ARTHROPLASTY  02/05/2012   Procedure:  TOTAL KNEE ARTHROPLASTY;  Surgeon: Gearlean Alf, MD;  Location: [facility];  Service: Orthopedics;  Laterality: Right;  . TOTAL KNEE ARTHROPLASTY Left 02/10/2013   Procedure: LEFT TOTAL KNEE ARTHROPLASTY;  Surgeon: Gearlean Alf, MD;  Location: [facility];  Service: Orthopedics;  Laterality: Left;  . total knee raplacement  01-2012   Right Knee  . TUBAL LIGATION  1997    Social History   Social History  . Marital status: Married    Spouse name: N/A  . Number of children: 3  . Years of education: N/A   Occupational History  . boutique owner [employer]   Social History Main Topics  . Smoking status: Never Smoker  . Smokeless tobacco: Never Used  . Alcohol use No  . Drug use: No  . Sexual activity: No   Other Topics Concern  . Not on file   Social History Narrative  . No narrative on file    Family History  Problem Relation Age of Onset  . Breast cancer Mother     sarcoma  . Lung cancer Mother   . Hypertension Mother   . Prostate cancer Father   . Congestive Heart Failure Father   . Heart attack Father   . Prostate cancer Brother   . Down syndrome Son   . Heart disease Maternal Grandfather     MI  . Stomach cancer Maternal Aunt   . Uterine cancer Maternal Aunt   . Colon cancer Neg Hx     ROS: no fevers or chills, productive cough, hemoptysis, dysphasia, odynophagia, melena, hematochezia, dysuria, hematuria, rash, seizure activity, orthopnea, PND, pedal edema, claudication. Remaining systems are negative.  Physical Exam: Well-developed well-nourished in no acute distress.  Skin is warm and dry.  HEENT is normal.  Neck is supple.  Chest is clear to auscultation with normal expansion.  Cardiovascular exam is regular rate and rhythm.  Abdominal exam nontender or distended. No masses palpated. Extremities show no edema. neuro grossly intact  ECG

## 2016-02-22 ENCOUNTER — Other Ambulatory Visit: Payer: Self-pay | Admitting: *Deleted

## 2016-02-22 ENCOUNTER — Telehealth: Payer: Self-pay | Admitting: *Deleted

## 2016-02-22 DIAGNOSIS — C50311 Malignant neoplasm of lower-inner quadrant of right female breast: Secondary | ICD-10-CM

## 2016-02-22 DIAGNOSIS — N63 Unspecified lump in unspecified breast: Secondary | ICD-10-CM

## 2016-02-22 DIAGNOSIS — Z171 Estrogen receptor negative status [ER-]: Principal | ICD-10-CM

## 2016-02-22 NOTE — Telephone Encounter (Signed)
This RN spoke with Shannon [name] per her return call per VM left by this RN with concern for new lump in right breast.  Shannon [name] states lump is approximately at the 6 oclock position in her right breast " near where I had my previous cancer "  Auren states lump is tender to touch.  Per discussion Shannon [name] would like to proceed with unilateral mammo and [place] understanding if needed biopsy will be done.  Appointment will be made with midlevel post scan for review and any further recommendations.

## 2016-02-22 NOTE — Telephone Encounter (Signed)
This RN returned call to pt per her VM stating she has found a new lump on her breast in area of prior surgery.  " I know Dr Jana Hakim released me from follow up and I am scheduled [facility] but I am concerned because I found this new place "  " it may just be scar tissue but I don't know and was hoping for advice "  Return call number given as [phone].  This RN returned call and obtained VM. Message left requesting return call to obtain further information for appropriate recommendations.

## 2016-02-24 ENCOUNTER — Ambulatory Visit: Payer: Medicare Other | Admitting: Cardiology

## 2016-02-25 ENCOUNTER — Other Ambulatory Visit: Payer: Self-pay | Admitting: Oncology

## 2016-02-25 DIAGNOSIS — C50311 Malignant neoplasm of lower-inner quadrant of right female breast: Secondary | ICD-10-CM

## 2016-02-25 DIAGNOSIS — Z171 Estrogen receptor negative status [ER-]: Principal | ICD-10-CM

## 2016-02-25 DIAGNOSIS — N63 Unspecified lump in unspecified breast: Secondary | ICD-10-CM

## 2016-02-27 ENCOUNTER — Encounter (HOSPITAL_COMMUNITY): Payer: Self-pay | Admitting: Emergency Medicine

## 2016-02-27 ENCOUNTER — Emergency Department (HOSPITAL_COMMUNITY): Payer: Medicare Other

## 2016-02-27 ENCOUNTER — Observation Stay (HOSPITAL_COMMUNITY)
Admission: EM | Admit: 2016-02-27 | Discharge: 2016-02-28 | Disposition: A | Discharge status: Home / Self Care | Payer: Medicare Other | Attending: Internal Medicine | Admitting: Internal Medicine

## 2016-02-27 DIAGNOSIS — I10 Essential (primary) hypertension: Secondary | ICD-10-CM | POA: Diagnosis not present

## 2016-02-27 DIAGNOSIS — F418 Other specified anxiety disorders: Secondary | ICD-10-CM | POA: Diagnosis not present

## 2016-02-27 DIAGNOSIS — F339 Major depressive disorder, recurrent, unspecified: Secondary | ICD-10-CM | POA: Diagnosis present

## 2016-02-27 DIAGNOSIS — J45909 Unspecified asthma, uncomplicated: Secondary | ICD-10-CM | POA: Insufficient documentation

## 2016-02-27 DIAGNOSIS — Y93E1 Activity, personal bathing and showering: Secondary | ICD-10-CM | POA: Insufficient documentation

## 2016-02-27 DIAGNOSIS — Z79899 Other long term (current) drug therapy: Secondary | ICD-10-CM | POA: Diagnosis not present

## 2016-02-27 DIAGNOSIS — R55 Syncope and collapse: Principal | ICD-10-CM | POA: Insufficient documentation

## 2016-02-27 DIAGNOSIS — E782 Mixed hyperlipidemia: Secondary | ICD-10-CM | POA: Diagnosis present

## 2016-02-27 DIAGNOSIS — W1830XA Fall on same level, unspecified, initial encounter: Secondary | ICD-10-CM | POA: Insufficient documentation

## 2016-02-27 DIAGNOSIS — Y9289 Other specified places as the place of occurrence of the external cause: Secondary | ICD-10-CM | POA: Insufficient documentation

## 2016-02-27 DIAGNOSIS — Z853 Personal history of malignant neoplasm of breast: Secondary | ICD-10-CM | POA: Diagnosis not present

## 2016-02-27 DIAGNOSIS — R404 Transient alteration of awareness: Secondary | ICD-10-CM | POA: Diagnosis not present

## 2016-02-27 DIAGNOSIS — N179 Acute kidney failure, unspecified: Secondary | ICD-10-CM | POA: Diagnosis not present

## 2016-02-27 DIAGNOSIS — Z7982 Long term (current) use of aspirin: Secondary | ICD-10-CM | POA: Diagnosis not present

## 2016-02-27 DIAGNOSIS — Z96653 Presence of artificial knee joint, bilateral: Secondary | ICD-10-CM | POA: Diagnosis not present

## 2016-02-27 DIAGNOSIS — R296 Repeated falls: Secondary | ICD-10-CM

## 2016-02-27 DIAGNOSIS — Y999 Unspecified external cause status: Secondary | ICD-10-CM | POA: Diagnosis not present

## 2016-02-27 DIAGNOSIS — R079 Chest pain, unspecified: Secondary | ICD-10-CM | POA: Diagnosis present

## 2016-02-27 DIAGNOSIS — R531 Weakness: Secondary | ICD-10-CM | POA: Diagnosis not present

## 2016-02-27 DIAGNOSIS — F329 Major depressive disorder, single episode, unspecified: Secondary | ICD-10-CM | POA: Diagnosis present

## 2016-02-27 HISTORY — DX: Syncope and collapse: R55

## 2016-02-27 HISTORY — DX: Chest pain, unspecified: R07.9

## 2016-02-27 LAB — URINALYSIS, ROUTINE W REFLEX MICROSCOPIC
BILIRUBIN URINE: NEGATIVE
Glucose, UA: NEGATIVE mg/dL
Hgb urine dipstick: NEGATIVE
Ketones, ur: NEGATIVE mg/dL
Nitrite: NEGATIVE
Protein, ur: NEGATIVE mg/dL
RBC / HPF: NONE SEEN RBC/hpf (ref 0–5)
SPECIFIC GRAVITY, URINE: 1.013 (ref 1.005–1.030)
pH: 6 (ref 5.0–8.0)

## 2016-02-27 LAB — CREATININE, SERUM
Creatinine, Ser: 1.1 mg/dL — ABNORMAL HIGH (ref 0.44–1.00)
GFR calc Af Amer: 57 mL/min — ABNORMAL LOW (ref >60)
GFR calc non Af Amer: 49 mL/min — ABNORMAL LOW (ref >60)

## 2016-02-27 LAB — CBC
HCT: 38.9 % (ref 36.0–46.0)
HEMATOCRIT: 37 % (ref 36.0–46.0)
Hemoglobin: 12.2 g/dL (ref 12.0–15.0)
Hemoglobin: 11.9 g/dL — ABNORMAL LOW (ref 12.0–15.0)
MCH: 28.5 pg (ref 26.0–34.0)
MCH: 29.3 pg (ref 26.0–34.0)
MCHC: 31.4 g/dL (ref 30.0–36.0)
MCHC: 32.2 g/dL (ref 30.0–36.0)
MCV: 90.9 fL (ref 78.0–100.0)
MCV: 91.1 fL (ref 78.0–100.0)
PLATELETS: 196 10*3/uL (ref 150–400)
Platelets: 203 10*3/uL (ref 150–400)
RBC: 4.28 MIL/uL (ref 3.87–5.11)
RBC: 4.06 MIL/uL (ref 3.87–5.11)
RDW: 13.9 % (ref 11.5–15.5)
RDW: 13.8 % (ref 11.5–15.5)
WBC: 9.6 10*3/uL (ref 4.0–10.5)
WBC: 8.8 10*3/uL (ref 4.0–10.5)

## 2016-02-27 LAB — BASIC METABOLIC PANEL
Anion gap: 11 (ref 5–15)
BUN: 20 mg/dL (ref 6–20)
CHLORIDE: 105 mmol/L (ref 101–111)
CO2: 25 mmol/L (ref 22–32)
CREATININE: 1.29 mg/dL — AB (ref 0.44–1.00)
Calcium: 9.6 mg/dL (ref 8.9–10.3)
GFR calc Af Amer: 47 mL/min — ABNORMAL LOW (ref >60)
GFR calc non Af Amer: 41 mL/min — ABNORMAL LOW (ref >60)
GLUCOSE: 115 mg/dL — AB (ref 65–99)
Potassium: 4.8 mmol/L (ref 3.5–5.1)
Sodium: 141 mmol/L (ref 135–145)

## 2016-02-27 LAB — CBG MONITORING, ED: GLUCOSE-CAPILLARY: 101 mg/dL — AB (ref 65–99)

## 2016-02-27 LAB — TROPONIN I: Troponin I: <0.03 ng/mL (ref <0.03)

## 2016-02-27 MED ORDER — GABAPENTIN 300 MG PO CAPS
300.0000 mg | ORAL_CAPSULE | Freq: Two times a day (BID) | ORAL | Status: DC
  Administered 2016-02-27 – 2016-02-28 (×2): 300 mg via ORAL
  Filled 2016-02-27 (×2): qty 1

## 2016-02-27 MED ORDER — SODIUM CHLORIDE 0.9% FLUSH
3.0000 mL | Freq: Two times a day (BID) | INTRAVENOUS | Status: DC
  Administered 2016-02-27: 3 mL via INTRAVENOUS

## 2016-02-27 MED ORDER — VENLAFAXINE HCL ER 75 MG PO CP24
225.0000 mg | ORAL_CAPSULE | Freq: Every day | ORAL | Status: DC
  Administered 2016-02-27 – 2016-02-28 (×2): 225 mg via ORAL
  Filled 2016-02-27 (×2): qty 3

## 2016-02-27 MED ORDER — SODIUM CHLORIDE 0.9 % IV BOLUS (SEPSIS)
500.0000 mL | Freq: Once | INTRAVENOUS | Status: AC
  Administered 2016-02-27: 500 mL via INTRAVENOUS

## 2016-02-27 MED ORDER — ASPIRIN EC 81 MG PO TBEC
81.0000 mg | DELAYED_RELEASE_TABLET | Freq: Every day | ORAL | Status: DC
  Administered 2016-02-27 – 2016-02-28 (×2): 81 mg via ORAL
  Filled 2016-02-27 (×2): qty 1

## 2016-02-27 MED ORDER — HYDROXYZINE HCL 25 MG PO TABS
25.0000 mg | ORAL_TABLET | Freq: Every day | ORAL | Status: DC
  Administered 2016-02-27: 25 mg via ORAL
  Filled 2016-02-27: qty 1

## 2016-02-27 MED ORDER — PENTOSAN POLYSULFATE SODIUM 100 MG PO CAPS
200.0000 mg | ORAL_CAPSULE | Freq: Two times a day (BID) | ORAL | Status: DC
  Administered 2016-02-27 – 2016-02-28 (×2): 200 mg via ORAL
  Filled 2016-02-27 (×2): qty 2

## 2016-02-27 MED ORDER — ENOXAPARIN SODIUM 40 MG/0.4ML ~~LOC~~ SOLN
40.0000 mg | SUBCUTANEOUS | Status: DC
  Administered 2016-02-27: 40 mg via SUBCUTANEOUS
  Filled 2016-02-27: qty 0.4

## 2016-02-27 MED ORDER — ACETAMINOPHEN 650 MG RE SUPP
650.0000 mg | Freq: Four times a day (QID) | RECTAL | Status: DC | PRN
Start: 2016-02-27 — End: 2016-02-28

## 2016-02-27 MED ORDER — CLONAZEPAM 0.5 MG PO TABS: 0.5000 mg | ORAL_TABLET | Freq: Every evening | ORAL | Status: DC | PRN

## 2016-02-27 MED ORDER — ROSUVASTATIN CALCIUM 10 MG PO TABS: 20.0000 mg | ORAL_TABLET | Freq: Every day | ORAL | Status: DC

## 2016-02-27 MED ORDER — SODIUM CHLORIDE 0.9 % IV SOLN
INTRAVENOUS | Status: DC
  Administered 2016-02-27: 21:00:00 via INTRAVENOUS

## 2016-02-27 MED ORDER — ACETAMINOPHEN 325 MG PO TABS
650.0000 mg | ORAL_TABLET | Freq: Four times a day (QID) | ORAL | Status: DC | PRN
Start: 2016-02-27 — End: 2016-02-28

## 2016-02-27 MED ORDER — FERROUS FUMARATE 324 (106 FE) MG PO TABS: 1.0000 | ORAL_TABLET | Freq: Every day | ORAL | Status: DC

## 2016-02-27 MED ORDER — FAMOTIDINE 20 MG PO TABS
20.0000 mg | ORAL_TABLET | Freq: Two times a day (BID) | ORAL | Status: DC
  Administered 2016-02-27 – 2016-02-28 (×2): 20 mg via ORAL
  Filled 2016-02-27 (×2): qty 1

## 2016-02-27 MED ORDER — ONDANSETRON HCL 4 MG/2ML IJ SOLN
4.0000 mg | Freq: Four times a day (QID) | INTRAMUSCULAR | Status: DC | PRN
Start: 2016-02-27 — End: 2016-02-28

## 2016-02-27 MED ORDER — ONDANSETRON HCL 4 MG PO TABS: 4.0000 mg | ORAL_TABLET | Freq: Four times a day (QID) | ORAL | Status: DC | PRN

## 2016-02-27 NOTE — ED Notes (Signed)
Called phlebotomy to help obtain labs.

## 2016-02-27 NOTE — ED Notes (Signed)
Rn attempted to obtain Blood;

## 2016-02-27 NOTE — ED Notes (Signed)
Pt not in room.

## 2016-02-27 NOTE — ED Provider Notes (Signed)
[facility] DEPT Provider Note   CSN: [id number] Arrival date & time: 02/27/16  1312     History   Chief Complaint Chief Complaint  Patient presents with  . Weakness    HPI Shannon [name] is a 71 y.o. female.  HPI Pt presents to ER after a syncopal episode when getting out of the shower today.  She had chest pressure, reported it as severe, took a nitroglycerin and then found herself on the ground and too generally weak to get up.  Family was able to get her on the bed and found her BP to be 70/30 at home. Pt reports posterior scalp pain at this time from possible head injury. No preceding CP and no CP at this time. recenly changed her diet and is trying to eat healthier. No seizure activity. No prior hx of MI or CAD. Hx of nonischemic cardiomyopathy. No recent illness or fever.    Past Medical History:  Diagnosis Date  . 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 Anemia 08/28/2011  . Asthma   . Brain tumor ([place])   . Breast cancer ([place])   . Carpal tunnel syndrome of left wrist   . Cataracts, bilateral 12/28/2015  . Constipation 03/03/2012  . DEPRESSION 08/11/2008  . Dermatitis   . Facial fracture ([place])   . GERD 08/11/2008  . H/O measles   . H/O mumps   . History of blood transfusion   . History of chicken pox   . 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 HYPERLIPIDEMIA 08/11/2008  . HYPERTENSION 08/11/2008  . Hyponatremia 08/28/2011  . 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 Interstitial cystitis   . LIPOMA 02/10/2009  . Medicare annual wellness visit, subsequent 12/27/2014   Follows with Dr Amalia Hailey of urology Follows with Dr Posey Pronto at [facility] for opthamology Follows with Dr [name] of dermatology Follows with [facility] gastroenterology, last scope done in 2011 repeat in 10 years Last [facility] July 2015 should repeat every 1-2 years Last Pap December 2015, repeat in 3 years.   . Melanoma ([place]) 2008   DR. Ronnald Ramp  .  Neuropathy ([place])   . NICM (nonischemic cardiomyopathy) ([place]) 03/08/2010   likely 2/2 chemotx - a. Echo 2012: EF 45-50%;  b. Lex MV 2/12:  low risk, apical defect (small area of ischemia vs shifting breast atten);  c.  Echo 7/12: Normal wall thickness, EF 60-65%, normal wall motion, grade 1 diastolic dysfunction, mild LAE, PASP 32;   d. Lex MV 11/13:  EF 76%, no ischemia  . OA (osteoarthritis) 09/07/2011  . Obesity 09/28/2014  . Osteoporosis   . PREDIABETES 06/15/2009  . Recurrent falls 12/28/2015  . Shortness of breath   . UTI (lower urinary tract infection) 03/03/2012    Patient Active Problem List   Diagnosis Date Noted  . Cataracts, bilateral 12/28/2015  . Recurrent falls 12/28/2015  . Genetic testing 11/29/2015  . Family history- stomach cancer 09/08/2015  . Family history of cancer 09/08/2015  . [name] lesion 06/21/2015  . Left leg swelling 05/24/2015  . Medicare annual wellness visit, subsequent 12/27/2014  . 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 Obesity 09/28/2014  . Interstitial cystitis 09/28/2014  . Pain in joint, ankle and foot 09/28/2014  . Abnormal nuclear stress test 12/16/2013  . Sternal pain 11/06/2013  . Conjunctivitis of left eye 10/13/2013  . Postoperative  anemia due to acute blood loss 02/13/2013  . Preop cardiovascular exam 12/30/2012  . 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 Breast cancer of lower-inner quadrant of right female breast ([place]) 10/21/2012  . Insomnia 10/18/2012  . Constipation 03/03/2012  . OA (osteoarthritis) of knee 02/05/2012  . OA (osteoarthritis) 09/07/2011  . Dermatitis   . Hyponatremia 08/28/2011  . Osteoporosis 03/07/2011  . IC (interstitial cystitis)   . Melanoma ([place])   . Craniopharyngioma ([place]) 03/25/2010  . Prediabetes 06/15/2009  . LIPOMA 02/10/2009  . Dyspnea on exertion 02/10/2009  . Hyperlipidemia, mixed 08/11/2008  . Depression with anxiety 08/11/2008  . Essential  hypertension 08/11/2008  . Gastro-esophageal reflux 08/11/2008    Past Surgical History:  Procedure Laterality Date  . BREAST LUMPECTOMY  04/2009   RIGHT FOR BREAST CANCER-CHEMO/RADIATION X 1 YEAR  . BREAST REDUCTION SURGERY Bilateral 05/04/2014   Procedure: MAMMARY REDUCTION  (BREAST);  Surgeon: Cristine Polio, MD;  Location: [facility];  Service: Plastics;  Laterality: Bilateral;  . CARPAL TUNNEL RELEASE     L wrist, ulnar nerve moved  . CHOLECYSTECTOMY    . CRANIOTOMY FOR TUMOR  2000  . ELBOW SURGERY     left  . KNEE ARTHROSCOPY Left 10/12/2014   Procedure: LEFT KNEE ARTHROSCOPY ;  Surgeon: Gaynelle Arabian, MD;  Location: [facility];  Service: Orthopedics;  Laterality: Left;  . LEFT HEART CATHETERIZATION WITH CORONARY ANGIOGRAM N/A 12/16/2013   Procedure: LEFT HEART CATHETERIZATION WITH CORONARY ANGIOGRAM;  Surgeon: Peter M [name], MD;  Location: [facility] CATH LAB;  Service: Cardiovascular;  Laterality: N/A;  . LIPOMA EXCISION  03/28/2009   right leg  . MELANOMA EXCISION    . PORT-A-CATH REMOVAL  11/30/2010   [place]  . porta cath    . PORTACATH PLACEMENT  may 2011  . SYNOVECTOMY Left 10/12/2014   Procedure: WITH SYNOVECTOMY;  Surgeon: Gaynelle Arabian, MD;  Location: [facility];  Service: Orthopedics;  Laterality: Left;  . TONSILLECTOMY  1958  . TOTAL KNEE ARTHROPLASTY  02/05/2012   Procedure: TOTAL KNEE ARTHROPLASTY;  Surgeon: Gearlean Alf, MD;  Location: [facility];  Service: Orthopedics;  Laterality: Right;  . TOTAL KNEE ARTHROPLASTY Left 02/10/2013   Procedure: LEFT TOTAL KNEE ARTHROPLASTY;  Surgeon: Gearlean Alf, MD;  Location: [facility];  Service: Orthopedics;  Laterality: Left;  . total knee raplacement  01-2012   Right Knee  . TUBAL LIGATION  1997    OB History    Gravida Para Term Preterm AB Living   '3 3       3   ' SAB TAB Ectopic Multiple Live Births                   Home Medications    Prior to Admission medications   Medication Sig Start Date End Date Taking?  Authorizing Provider  acetaminophen (TYLENOL) 500 MG tablet Take 1,000 mg by mouth every 6 (six) hours as needed for moderate pain or headache.    Historical Provider, MD  alendronate (FOSAMAX) 70 MG tablet Take 1 tablet (70 mg total) by mouth every 7 (seven) days. Take with a full glass of water on an empty stomach. Patient not taking: Reported on 12/28/2015 06/24/15   Huel Cote, NP  aspirin 81 MG tablet Take 81 mg by mouth daily.    Historical Provider, MD  B Complex-C (SUPER B COMPLEX PO) Take 1 tablet by mouth daily.    Historical Provider, MD  carvedilol (COREG) 25 MG tablet  Take 1 tablet (25 mg total) by mouth 2 (two) times daily with a meal. 12/28/15   Mosie Lukes, MD  Cholecalciferol 2000 UNITS CAPS Take 2,000 Units by mouth daily.    Historical Provider, MD  clonazePAM (KLONOPIN) 1 MG tablet TAKE 1/2 to 1 TABLET BY MOUTH AT BEDTIME. 12/28/15   Mosie Lukes, MD  Ferrous Fumarate (FERROCITE) 324 (106 Fe) MG TABS tablet Take 1 tablet (106 mg of iron total) by mouth daily. 11/05/15   Mosie Lukes, MD  folic acid (FOLVITE) 1 MG tablet TAKE ONE TABLET (1 MG TOTAL) BY MOUTH DAILY. 12/28/15   Mosie Lukes, MD  gabapentin (NEURONTIN) 300 MG capsule Take 1 capsule (300 mg total) by mouth 2 (two) times daily. Reported on 05/24/2015 12/28/15   Mosie Lukes, MD  hydrochlorothiazide (MICROZIDE) 12.5 MG capsule Take 1 capsule (12.5 mg total) by mouth daily. 11/04/15   Mosie Lukes, MD  hydrOXYzine (ATARAX/VISTARIL) 25 MG tablet Take 25 mg by mouth at bedtime.    Historical Provider, MD  nitroGLYCERIN (NITROSTAT) 0.4 MG SL tablet Place 1 tablet (0.4 mg total) under the tongue every 5 (five) minutes as needed for chest pain. 11/04/15   Mosie Lukes, MD  pentosan polysulfate (ELMIRON) 100 MG capsule Take 200 mg by mouth 2 (two) times daily. 02/07/12   Alexzandrew L Perkins, PA-C  RA KRILL OIL 500 MG CAPS Take 1 tablet by mouth daily.    Historical Provider, MD  ranitidine (ZANTAC) 150 MG tablet  Take 1 tablet (150 mg total) by mouth 2 (two) times daily as needed for heartburn. 06/24/15   Mosie Lukes, MD  rosuvastatin (CRESTOR) 20 MG tablet TAKE 1 TABLET (20 MG TOTAL) BY MOUTH AT BEDTIME. 11/05/15   Mosie Lukes, MD  telmisartan (MICARDIS) 80 MG tablet TAKE 1 TABLET (80 MG TOTAL) BY MOUTH DAILY. 01/05/16   Mosie Lukes, MD  Venlafaxine HCl 225 MG TB24 Take 1 tablet (225 mg total) by mouth daily. 11/04/15   Mosie Lukes, MD    Family History Family History  Problem Relation Age of Onset  . Breast cancer Mother     sarcoma  . Lung cancer Mother   . Hypertension Mother   . Prostate cancer Father   . Congestive Heart Failure Father   . Heart attack Father   . Prostate cancer Brother   . Down syndrome Son   . Heart disease Maternal Grandfather     MI  . Stomach cancer Maternal Aunt   . Uterine cancer Maternal Aunt   . Colon cancer Neg Hx     Social History Social History  Substance Use Topics  . Smoking status: Never Smoker  . Smokeless tobacco: Never Used  . Alcohol use No     Allergies   Dilaudid [hydromorphone hcl]   Review of Systems Review of Systems  All other systems reviewed and are negative.    Physical Exam Updated Vital Signs BP 123/57   Pulse (!) 56   Temp 97.4 F (36.3 C) (Oral)   Resp 12   Ht '5\' 4"'  (1.626 m)   Wt 200 lb (90.7 kg)   LMP 01/30/1994   SpO2 100%   BMI 34.33 kg/m   Physical Exam  Constitutional: She is oriented to person, place, and time. She appears well-developed and well-nourished. No distress.  HENT:  Head: Normocephalic and atraumatic.  Eyes: EOM are normal.  Neck: Normal range of motion.  Cardiovascular: Normal rate,  regular rhythm and normal heart sounds.   Pulmonary/Chest: Effort normal and breath sounds normal.  Abdominal: Soft. She exhibits no distension. There is no tenderness.  Musculoskeletal: Normal range of motion.  Neurological: She is alert and oriented to person, place, and time.  Skin: Skin is  warm and dry.  Psychiatric: She has a normal mood and affect. Judgment normal.  Nursing note and vitals reviewed.    ED Treatments / Results  Labs (all labs ordered are listed, but only abnormal results are displayed) Labs Reviewed  BASIC METABOLIC PANEL - Abnormal; Notable for the following:       Result Value   Glucose, Bld 115 (*)    Creatinine, Ser 1.29 (*)    GFR calc non Af Amer 41 (*)    GFR calc Af Amer 47 (*)    All other components within normal limits  CBG MONITORING, ED - Abnormal; Notable for the following:    Glucose-Capillary 101 (*)    All other components within normal limits  CBC  URINALYSIS, ROUTINE W REFLEX MICROSCOPIC  TROPONIN I  CBG MONITORING, ED    EKG  EKG Interpretation  Date/Time:  Sunday February 27 2016 13:30:12 EST Ventricular Rate:  58 PR Interval:    QRS Duration: 78 QT Interval:  434 QTC Calculation: 427 R Axis:   -10 Text Interpretation:  Sinus rhythm Low voltage, precordial leads No significant change was found Confirmed by Kylie Simmonds  MD, Lennette Bihari ([id number]) on 02/27/2016 2:52:55 PM       Radiology Dg Chest 2 View  Result Date: 02/27/2016 CLINICAL DATA:  Patient status post fall from shower. Chest tightness. EXAM: CHEST  2 VIEW COMPARISON:  Chest CT 05/26/2015; chest radiograph 05/24/2015. FINDINGS: Normal cardiac and mediastinal contours. Elevation of the right hemidiaphragm. Minimal basilar atelectasis. No pleural effusion or pneumothorax. Mid thoracic spine degenerative changes. Cholecystectomy clips. IMPRESSION: No active cardiopulmonary disease. Electronically Signed   By: Lovey Newcomer M.D.   On: 02/27/2016 15:49   Ct Head Wo Contrast  Result Date: 02/27/2016 CLINICAL DATA:  Syncope, head injury EXAM: CT HEAD WITHOUT CONTRAST TECHNIQUE: Contiguous axial images were obtained from the base of the skull through the vertex without intravenous contrast. COMPARISON:  05/17/2015 FINDINGS: Brain: No acute intracranial abnormality. Specifically, no  hemorrhage, hydrocephalus, mass lesion, acute infarction, or significant intracranial injury. Vascular: No hyperdense vessel or unexpected calcification. Skull: No acute calvarial abnormality. Prior right frontotemporal craniotomy. Sinuses/Orbits: Visualized paranasal sinuses and mastoids clear. Orbital soft tissues unremarkable. Other: None IMPRESSION: No acute intracranial abnormality. Electronically Signed   By: Rolm Baptise M.D.   On: 02/27/2016 15:30    Procedures Procedures (including critical care time)  Medications Ordered in ED Medications - No data to display   Initial Impression / Assessment and Plan / ED Course  I have reviewed the triage vital signs and the nursing notes.  Pertinent labs & imaging results that were available during my care of the patient were reviewed by me and considered in my medical decision making (see chart for details).     Will admit for syncope evaluation in the setting of chest pressure. Suspect volume depletion. 1.5 liters of NS given in ER. Nonischemic ecg. Will admit  Final Clinical Impressions(s) / ED Diagnoses   Final diagnoses:  Syncope, unspecified syncope type    New Prescriptions New Prescriptions   No medications on file     Jola Schmidt, MD 02/27/16 [id number]

## 2016-02-27 NOTE — ED Triage Notes (Signed)
Patient presents with generalized weakness. Patient states she has started a new diet plan and has only has a banana and half a piece bread today. Patient states she has had a dizziness, lightheadedness. Patient Denis any LOC. EMS states initial  pressure 90/60. EMS states CBG 120. Patient states she did a little bit of Chest Pain. Patient states she took 1 nitro and pain relieved. Patient alert and oriented  x4 Patient moving all extremities.

## 2016-02-27 NOTE — ED Notes (Signed)
Patient family states patient looks "pale"

## 2016-02-27 NOTE — H&P (Signed)
[facility] History and Physical  Shannon [name]:[id number] DOB: 1945-08-20 DOA: 02/27/2016  PCP: Penni Homans, MD  Patient coming from: Home  Chief Complaint: Chest pain, passing out  HPI: Shannon [name] is a 71 y.o. female with a medical history of hypertension, anxiety, nonischemic cardiomyopathy, who presented to the emergency department with complaints of chest pain and passing out. Patient states she woke this morning with some chest pain and took a nitroglycerin tablet. She was interested to church, and had jumped into the shower. Patient started to feel lightheaded. She was able to get out of the shower and get dressed before passing out. She states she can't remember how long she passed out for. She does her mother hitting her head. Patient states she has had chest pain which has been ongoing for quite some time. It is stabbing in nature and located in the center of her chest. Patient experiences no other symptoms. She feels as this could be acid reflux. Patient did try to get up after she fell today. Her blood pressure at home was noted to be in the 03T systolic. She was brought to the emergency department by EMS and started on IV fluids. Patient does note a change in her diet, she has been trying to eat healthier. She also states that she recently started taking hydrochlorothiazide a few weeks ago. She has noted that she's been dizzy lately. Patient denies any recent weight gain, recent illness, recent travel or ill contacts. Denies any shortness of breath, abdominal pain, nausea or vomiting, diarrhea or constipation, headache.  ED Course: Found to be hypotensive. CT head unremarkable, chest x-ray unremarkable for infection. Patient given 1.5 L of fluid. 2. Call for admission.  Review of Systems:  All other systems reviewed and are negative.   Past Medical History:  Diagnosis Date  . 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 Anemia 08/28/2011  . Asthma   . Brain tumor  ([place])   . Breast cancer ([place])   . Carpal tunnel syndrome of left wrist   . Cataracts, bilateral 12/28/2015  . Constipation 03/03/2012  . DEPRESSION 08/11/2008  . Dermatitis   . Facial fracture ([place])   . GERD 08/11/2008  . H/O measles   . H/O mumps   . History of blood transfusion   . History of chicken pox   . 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 HYPERLIPIDEMIA 08/11/2008  . HYPERTENSION 08/11/2008  . Hyponatremia 08/28/2011  . 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 Interstitial cystitis   . LIPOMA 02/10/2009  . Medicare annual wellness visit, subsequent 12/27/2014   Follows with Dr Amalia Hailey of urology Follows with Dr Posey Pronto at [facility] for opthamology Follows with Dr [name] of dermatology Follows with [facility] gastroenterology, last scope done in 2011 repeat in 10 years Last [facility] July 2015 should repeat every 1-2 years Last Pap December 2015, repeat in 3 years.   . Melanoma ([place]) 2008   DR. Ronnald Ramp  . Neuropathy ([place])   . NICM (nonischemic cardiomyopathy) ([place]) 03/08/2010   likely 2/2 chemotx - a. Echo 2012: EF 45-50%;  b. Lex MV 2/12:  low risk, apical defect (small area of ischemia vs shifting breast atten);  c.  Echo 7/12: Normal wall thickness, EF 60-65%, normal wall motion, grade 1 diastolic dysfunction, mild LAE, PASP 32;   d. Lex MV 11/13:  EF 76%, no ischemia  .  OA (osteoarthritis) 09/07/2011  . Obesity 09/28/2014  . Osteoporosis   . PREDIABETES 06/15/2009  . Recurrent falls 12/28/2015  . Shortness of breath   . UTI (lower urinary tract infection) 03/03/2012    Past Surgical History:  Procedure Laterality Date  . BREAST LUMPECTOMY  04/2009   RIGHT FOR BREAST CANCER-CHEMO/RADIATION X 1 YEAR  . BREAST REDUCTION SURGERY Bilateral 05/04/2014   Procedure: MAMMARY REDUCTION  (BREAST);  Surgeon: Cristine Polio, MD;  Location: [facility];  Service: Plastics;  Laterality: Bilateral;  . CARPAL TUNNEL RELEASE     L  wrist, ulnar nerve moved  . CHOLECYSTECTOMY    . CRANIOTOMY FOR TUMOR  2000  . ELBOW SURGERY     left  . KNEE ARTHROSCOPY Left 10/12/2014   Procedure: LEFT KNEE ARTHROSCOPY ;  Surgeon: Gaynelle Arabian, MD;  Location: [facility];  Service: Orthopedics;  Laterality: Left;  . LEFT HEART CATHETERIZATION WITH CORONARY ANGIOGRAM N/A 12/16/2013   Procedure: LEFT HEART CATHETERIZATION WITH CORONARY ANGIOGRAM;  Surgeon: Peter M [name], MD;  Location: [facility] CATH LAB;  Service: Cardiovascular;  Laterality: N/A;  . LIPOMA EXCISION  03/28/2009   right leg  . MELANOMA EXCISION    . PORT-A-CATH REMOVAL  11/30/2010   [place]  . porta cath    . PORTACATH PLACEMENT  may 2011  . SYNOVECTOMY Left 10/12/2014   Procedure: WITH SYNOVECTOMY;  Surgeon: Gaynelle Arabian, MD;  Location: [facility];  Service: Orthopedics;  Laterality: Left;  . TONSILLECTOMY  1958  . TOTAL KNEE ARTHROPLASTY  02/05/2012   Procedure: TOTAL KNEE ARTHROPLASTY;  Surgeon: Gearlean Alf, MD;  Location: [facility];  Service: Orthopedics;  Laterality: Right;  . TOTAL KNEE ARTHROPLASTY Left 02/10/2013   Procedure: LEFT TOTAL KNEE ARTHROPLASTY;  Surgeon: Gearlean Alf, MD;  Location: [facility];  Service: Orthopedics;  Laterality: Left;  . total knee raplacement  01-2012   Right Knee  . TUBAL LIGATION  1997    Social History:  reports that she has never smoked. She has never used smokeless tobacco. She reports that she does not drink alcohol or use drugs.   Allergies  Allergen Reactions  . Dilaudid [Hydromorphone Hcl] Itching    Family History  Problem Relation Age of Onset  . Breast cancer Mother     sarcoma  . Lung cancer Mother   . Hypertension Mother   . Prostate cancer Father   . Congestive Heart Failure Father   . Heart attack Father   . Prostate cancer Brother   . Down syndrome Son   . Heart disease Maternal Grandfather     MI  . Stomach cancer Maternal Aunt   . Uterine cancer Maternal Aunt   . Colon cancer Neg Hx      Prior to Admission  medications   Medication Sig Start Date End Date Taking? Authorizing Provider  acetaminophen (TYLENOL) 500 MG tablet Take 1,000 mg by mouth every 6 (six) hours as needed for moderate pain or headache.    Historical Provider, MD  aspirin 81 MG tablet Take 81 mg by mouth daily.    Historical Provider, MD  B Complex-C (SUPER B COMPLEX PO) Take 1 tablet by mouth daily.    Historical Provider, MD  carvedilol (COREG) 25 MG tablet Take 1 tablet (25 mg total) by mouth 2 (two) times daily with a meal. 12/28/15   Mosie Lukes, MD  Cholecalciferol 2000 UNITS CAPS Take 2,000 Units by mouth daily.    Historical Provider, MD  clonazePAM (KLONOPIN) 1 MG tablet TAKE 1/2 to 1 TABLET BY MOUTH AT BEDTIME. 12/28/15   Mosie Lukes, MD  Ferrous Fumarate (FERROCITE) 324 (106 Fe) MG TABS tablet Take 1 tablet (106 mg of iron total) by mouth daily. 11/05/15   Mosie Lukes, MD  folic acid (FOLVITE) 1 MG tablet TAKE ONE TABLET (1 MG TOTAL) BY MOUTH DAILY. 12/28/15   Mosie Lukes, MD  gabapentin (NEURONTIN) 300 MG capsule Take 1 capsule (300 mg total) by mouth 2 (two) times daily. Reported on 05/24/2015 12/28/15   Mosie Lukes, MD  hydrochlorothiazide (MICROZIDE) 12.5 MG capsule Take 1 capsule (12.5 mg total) by mouth daily. 11/04/15   Mosie Lukes, MD  hydrOXYzine (ATARAX/VISTARIL) 25 MG tablet Take 25 mg by mouth at bedtime.    Historical Provider, MD  nitroGLYCERIN (NITROSTAT) 0.4 MG SL tablet Place 1 tablet (0.4 mg total) under the tongue every 5 (five) minutes as needed for chest pain. 11/04/15   Mosie Lukes, MD  pentosan polysulfate (ELMIRON) 100 MG capsule Take 200 mg by mouth 2 (two) times daily. 02/07/12   Alexzandrew L Perkins, PA-C  RA KRILL OIL 500 MG CAPS Take 1 tablet by mouth daily.    Historical Provider, MD  ranitidine (ZANTAC) 150 MG tablet Take 1 tablet (150 mg total) by mouth 2 (two) times daily as needed for heartburn. 06/24/15   Mosie Lukes, MD  rosuvastatin (CRESTOR) 20 MG tablet TAKE 1 TABLET  (20 MG TOTAL) BY MOUTH AT BEDTIME. 11/05/15   Mosie Lukes, MD  telmisartan (MICARDIS) 80 MG tablet TAKE 1 TABLET (80 MG TOTAL) BY MOUTH DAILY. 01/05/16   Mosie Lukes, MD  Venlafaxine HCl 225 MG TB24 Take 1 tablet (225 mg total) by mouth daily. 11/04/15   Mosie Lukes, MD    Physical Exam: Vitals:   02/27/16 1501 02/27/16 1524  BP:  123/57  Pulse: (!) 58 (!) 56  Resp: 21 12  Temp:       General: Well developed, well nourished, NAD, appears stated age  HEENT: NCAT, PERRLA, EOMI, Anicteic Sclera, mucous membranes moist.   Neck: Supple, no JVD, no masses  Cardiovascular: S1 S2 auscultated, no rubs, murmurs or gallops. Regular rate and rhythm.  Respiratory: Clear to auscultation bilaterally with equal chest rise  Abdomen: Soft, obese, nontender, nondistended, + bowel sounds  Extremities: warm dry without cyanosis clubbing or edema  Neuro: AAOx3, cranial nerves grossly intact. Strength 5/5 in patient's upper and lower extremities bilaterally  Skin: Without rashes exudates or nodules  Psych: Mildly anxious, however appropriate with intact judgement and insight  Labs on Admission: I have personally reviewed following labs and imaging studies CBC:  Recent Labs Lab 02/27/16 1458  WBC 9.6  HGB 12.2  HCT 38.9  MCV 90.9  PLT 242   Basic Metabolic Panel:  Recent Labs Lab 02/27/16 1458  NA 141  K 4.8  CL 105  CO2 25  GLUCOSE 115*  BUN 20  CREATININE 1.29*  CALCIUM 9.6   GFR: Estimated Creatinine Clearance: 43.6 mL/min (by C-G formula based on SCr of 1.29 mg/dL (H)). Liver Function Tests: No results for input(s): AST, ALT, ALKPHOS, BILITOT, PROT, ALBUMIN in the last 168 hours. No results for input(s): LIPASE, AMYLASE in the last 168 hours. No results for input(s): AMMONIA in the last 168 hours. Coagulation Profile: No results for input(s): INR, PROTIME in the last 168 hours. Cardiac Enzymes:  Recent Labs Lab 02/27/16 1458  TROPONINI <0.03  BNP (last 3  results) No results for input(s): PROBNP in the last 8760 hours. HbA1C: No results for input(s): HGBA1C in the last 72 hours. CBG:  Recent Labs Lab 02/27/16 1321  GLUCAP 101*   Lipid Profile: No results for input(s): CHOL, HDL, LDLCALC, TRIG, CHOLHDL, LDLDIRECT in the last 72 hours. Thyroid Function Tests: No results for input(s): TSH, T4TOTAL, FREET4, T3FREE, THYROIDAB in the last 72 hours. Anemia Panel: No results for input(s): VITAMINB12, FOLATE, FERRITIN, TIBC, IRON, RETICCTPCT in the last 72 hours. Urine analysis:    Component Value Date/Time   COLORURINE YELLOW 02/27/2016 1532   APPEARANCEUR HAZY (A) 02/27/2016 1532   LABSPEC 1.013 02/27/2016 1532   PHURINE 6.0 02/27/2016 1532   GLUCOSEU NEGATIVE 02/27/2016 [place] 06/15/2010 1048   HGBUR NEGATIVE 02/27/2016 1532   BILIRUBINUR NEGATIVE 02/27/2016 1532   BILIRUBINUR neg 03/20/2012 1001   KETONESUR NEGATIVE 02/27/2016 1532   PROTEINUR NEGATIVE 02/27/2016 1532   UROBILINOGEN 0.2 02/07/2013 1145   NITRITE NEGATIVE 02/27/2016 1532   LEUKOCYTESUR MODERATE (A) 02/27/2016 1532   Sepsis Labs: '@LABRCNTIP' (procalcitonin:4,lacticidven:4) )No results found for this or any previous visit (from the past 240 hour(s)).   Radiological Exams on Admission: Dg Chest 2 View  Result Date: 02/27/2016 CLINICAL DATA:  Patient status post fall from shower. Chest tightness. EXAM: CHEST  2 VIEW COMPARISON:  Chest CT 05/26/2015; chest radiograph 05/24/2015. FINDINGS: Normal cardiac and mediastinal contours. Elevation of the right hemidiaphragm. Minimal basilar atelectasis. No pleural effusion or pneumothorax. Mid thoracic spine degenerative changes. Cholecystectomy clips. IMPRESSION: No active cardiopulmonary disease. Electronically Signed   By: Lovey Newcomer M.D.   On: 02/27/2016 15:49   Ct Head Wo Contrast  Result Date: 02/27/2016 CLINICAL DATA:  Syncope, head injury EXAM: CT HEAD WITHOUT CONTRAST TECHNIQUE: Contiguous axial  images were obtained from the base of the skull through the vertex without intravenous contrast. COMPARISON:  05/17/2015 FINDINGS: Brain: No acute intracranial abnormality. Specifically, no hemorrhage, hydrocephalus, mass lesion, acute infarction, or significant intracranial injury. Vascular: No hyperdense vessel or unexpected calcification. Skull: No acute calvarial abnormality. Prior right frontotemporal craniotomy. Sinuses/Orbits: Visualized paranasal sinuses and mastoids clear. Orbital soft tissues unremarkable. Other: None IMPRESSION: No acute intracranial abnormality. Electronically Signed   By: Rolm Baptise M.D.   On: 02/27/2016 15:30    EKG: Independently reviewed. Sinus rhythm, rate 58  Assessment/Plan  Chest pain -Patient has had atypical chest pain the past and she sees Dr. Stanford Breed. -?GERD -EKG unremarkable for ischemia, troponin negative -Continue to cycle troponins -Continue statin, aspirin -Holding coreg due to hypotension, bradycardia  Syncope -Likely secondary to nitroglycerin, BP meds, hypotension -Orthostatics unremarkable -Patient took nitroglycerin, then followed by syncopal episode approximately one hour later. -Telemetry monitoring -Echocardiogram 12/14/2015 showed an EF 93-71%, grade 2 diastolic dysfunction -Will obtain carotid Doppler -Neuro checks -CT head unremarkable for acute findings  Acute kidney injury -Likely secondary to medications versus dehydration -Patient's creatinine 1.29,  Hypotension -Likely secondary to medications, dehydration -Will hold patient's blood pressure medicines at this time -BP improving with IV fluid  Essential hypertension -Patient presented with hypotension, will hold Micardis, Coreg, HCTZ -Of note patient recently started HCTZ a few weeks ago and has been feeling dizzy since.  History of nonischemic cardiopathy/chronic diastolic heart failure -Echocardiogram 12/14/2015 showed EF of 6967%, grade 2 diastolic  dysfunction -Holding Micardis and Coreg -Patient appears to be compensated -Monitor intake and output, daily weights  Anxiety/Depression -Continue Klonopin PRN, venlafaxine  Neuropathy -Continue gabapentin  DVT prophylaxis: Lovenox  Code Status: Full  Family Communication: Daughter at bedside. Admission, patients condition and plan of care including tests being ordered have been discussed with the patient and daughter who indicate understanding and agree with the plan and Code Status.  Disposition Plan: Home   Consults called: None   Admission status: Observation   Time spent: 60 minutes  Crescentia Boutwell D.O. [facility] Pager [phone]  If 7PM-7AM, please contact night-coverage [email or web address] Password [facility] 02/27/2016, 6:21 PM

## 2016-02-28 ENCOUNTER — Observation Stay (HOSPITAL_BASED_OUTPATIENT_CLINIC_OR_DEPARTMENT_OTHER): Payer: Medicare Other

## 2016-02-28 ENCOUNTER — Other Ambulatory Visit: Payer: Self-pay | Admitting: *Deleted

## 2016-02-28 DIAGNOSIS — N179 Acute kidney failure, unspecified: Secondary | ICD-10-CM | POA: Diagnosis not present

## 2016-02-28 DIAGNOSIS — R55 Syncope and collapse: Secondary | ICD-10-CM

## 2016-02-28 DIAGNOSIS — F418 Other specified anxiety disorders: Secondary | ICD-10-CM | POA: Diagnosis not present

## 2016-02-28 DIAGNOSIS — R079 Chest pain, unspecified: Secondary | ICD-10-CM | POA: Diagnosis not present

## 2016-02-28 LAB — TROPONIN I: Troponin I: <0.03 ng/mL (ref <0.03)

## 2016-02-28 LAB — CBC
HEMATOCRIT: 34.7 % — AB (ref 36.0–46.0)
HEMOGLOBIN: 11.1 g/dL — AB (ref 12.0–15.0)
MCH: 29.2 pg (ref 26.0–34.0)
MCHC: 32 g/dL (ref 30.0–36.0)
MCV: 91.3 fL (ref 78.0–100.0)
Platelets: 204 10*3/uL (ref 150–400)
RBC: 3.8 MIL/uL — AB (ref 3.87–5.11)
RDW: 14 % (ref 11.5–15.5)
WBC: 6.9 10*3/uL (ref 4.0–10.5)

## 2016-02-28 LAB — VAS US CAROTID
LCCADDIAS: -35 cm/s
LCCADSYS: -90 cm/s
LCCAPDIAS: 30 cm/s
LCCAPSYS: 101 cm/s
LEFT ECA DIAS: -23 cm/s
LEFT VERTEBRAL DIAS: 14 cm/s
LICADDIAS: -30 cm/s
LICADSYS: -85 cm/s
LICAPSYS: -69 cm/s
Left ICA prox dias: -24 cm/s
RCCADSYS: -94 cm/s
RCCAPSYS: 102 cm/s
RIGHT ECA DIAS: -26 cm/s
RIGHT VERTEBRAL DIAS: 17 cm/s
Right CCA prox dias: 27 cm/s

## 2016-02-28 LAB — MAGNESIUM: Magnesium: 1.8 mg/dL (ref 1.7–2.4)

## 2016-02-28 LAB — BASIC METABOLIC PANEL
ANION GAP: 5 (ref 5–15)
BUN: 13 mg/dL (ref 6–20)
CO2: 28 mmol/L (ref 22–32)
CREATININE: 0.91 mg/dL (ref 0.44–1.00)
Calcium: 8.8 mg/dL — ABNORMAL LOW (ref 8.9–10.3)
Chloride: 109 mmol/L (ref 101–111)
GFR calc non Af Amer: >60 mL/min (ref >60)
Glucose, Bld: 96 mg/dL (ref 65–99)
POTASSIUM: 4.6 mmol/L (ref 3.5–5.1)
Sodium: 142 mmol/L (ref 135–145)

## 2016-02-28 LAB — GLUCOSE, CAPILLARY: Glucose-Capillary: 99 mg/dL (ref 65–99)

## 2016-02-28 MED ORDER — TELMISARTAN 80 MG PO TABS: 40.0000 mg | ORAL_TABLET | Freq: Every day | ORAL | 0 refills | Status: DC

## 2016-02-28 MED ORDER — CARVEDILOL 25 MG PO TABS: 12.5000 mg | ORAL_TABLET | Freq: Two times a day (BID) | ORAL | 0 refills | Status: DC

## 2016-02-28 NOTE — Progress Notes (Signed)
Pt discharged home. Discharge instructions have been gone over with the patient. IV's removed. Pt given unit number and told to call if they have any concerns regarding their discharge instructions. Alexianna Nachreiner V, RN   

## 2016-02-28 NOTE — Evaluation (Signed)
Physical Therapy Evaluation Patient Details Name: Shannon [name] MRN: [id number] DOB: September 18, 1945 Today's Date: 02/28/2016   History of Present Illness  71 y.o.femalewith a medical history of hypertension, anxiety, nonischemic cardiomyopathy, who presented to the emergency department with complaints of chest pain and passing out.   Clinical Impression  Pt admitted with above diagnosis. Pt currently with functional limitations due to the deficits listed below (see PT Problem List). Pt able to ambulate 250 ft without an assistive device, mild instability but no loss of balance. PT to follow acutely in anticipation of D/C to home with spouse to assist at D/C.     Follow Up Recommendations No PT follow up;Supervision - Intermittent    Equipment Recommendations  None recommended by PT    Recommendations for Other Services       Precautions / Restrictions Precautions Precautions: None Restrictions Weight Bearing Restrictions: No      Mobility  Bed Mobility               General bed mobility comments: Pt sitting EOB upon arrival  Transfers Overall transfer level: Needs assistance Equipment used: None Transfers: Sit to/from Stand Sit to Stand: Supervision         General transfer comment: from EOB and toilet, supervison for safety  Ambulation/Gait Ambulation/Gait assistance: Min guard Ambulation Distance (Feet): 250 Feet Assistive device: None Gait Pattern/deviations: Step-through pattern Gait velocity: WFL   General Gait Details: mild instabilty but no loss of balance. Pt reports feeling a little off but much better.   Stairs Stairs:  (pt declined)          Wheelchair Mobility    Modified Rankin (Stroke Patients Only)       Balance Overall balance assessment: Needs assistance Sitting-balance support: No upper extremity supported Sitting balance-Leahy Scale: Good     Standing balance support: No upper extremity supported Standing balance-Leahy  Scale: Fair                               Pertinent Vitals/Pain Pain Assessment: No/denies pain    Home Living Family/patient expects to be discharged to:: Private residence Living Arrangements: Spouse/significant other Available Help at Discharge: Family;Available 24 hours/day Type of Home: House Home Access: Stairs to enter Entrance Stairs-Rails: Right Entrance Stairs-Number of Steps: 3 Home Layout: Multi-level;Able to live on main level with bedroom/bathroom        Prior Function Level of Independence: Independent               Hand Dominance        Extremity/Trunk Assessment   Upper Extremity Assessment Upper Extremity Assessment: Overall WFL for tasks assessed    Lower Extremity Assessment Lower Extremity Assessment: Overall WFL for tasks assessed    Cervical / Trunk Assessment Cervical / Trunk Assessment: Normal  Communication   Communication: No difficulties  Cognition Arousal/Alertness: Awake/alert Behavior During Therapy: WFL for tasks assessed/performed Overall Cognitive Status: Within Functional Limits for tasks assessed                      General Comments      Exercises     Assessment/Plan    PT Assessment Patient needs continued PT services  PT Problem List Decreased strength;Decreased range of motion;Decreased activity tolerance;Decreased balance;Decreased mobility          PT Treatment Interventions DME instruction;Gait training;Stair training;Functional mobility training;Therapeutic activities;Therapeutic exercise;Balance training;Patient/family education    PT  Goals (Current goals can be found in the Care Plan section)  Acute Rehab PT Goals Patient Stated Goal: get home PT Goal Formulation: With patient Time For Goal Achievement: 03/13/16 Potential to Achieve Goals: Good    Frequency Min 3X/week   Barriers to discharge        Co-evaluation               End of Session Equipment Utilized  During Treatment: Gait belt Activity Tolerance: Patient tolerated treatment well Patient left: in chair;with call bell/phone within reach Nurse Communication: Mobility status    Functional Assessment Tool Used: clinical judgment Functional Limitation: Mobility: Walking and moving around Mobility: Walking and Moving Around Current Status [phone]): At least 1 percent but less than 20 percent impaired, limited or restricted Mobility: Walking and Moving Around Goal Status [phone]): At least 1 percent but less than 20 percent impaired, limited or restricted    Time: 0928-0949 PT Time Calculation (min) (ACUTE ONLY): 21 min   Charges:   PT Evaluation $PT Eval Moderate Complexity: 1 Procedure     PT G Codes:   PT G-Codes **NOT FOR INPATIENT CLASS** Functional Assessment Tool Used: clinical judgment Functional Limitation: Mobility: Walking and moving around Mobility: Walking and Moving Around Current Status [id number]): At least 1 percent but less than 20 percent impaired, limited or restricted Mobility: Walking and Moving Around Goal Status [phone]): At least 1 percent but less than 20 percent impaired, limited or restricted    Cassell Clement, PT, [place] Pager [phone] Office [phone]  02/28/2016, 9:57 AM

## 2016-02-28 NOTE — Progress Notes (Signed)
VASCULAR LAB PRELIMINARY  PRELIMINARY  PRELIMINARY  PRELIMINARY  Carotid duplex completed.    Preliminary report:  1-39% ICA plaquing. Vertebral artery flow is antegrade.   Elba Schaber, RVT 02/28/2016, 8:10 AM

## 2016-02-28 NOTE — Discharge Instructions (Signed)
Syncope °Introduction °Syncope is when you lose temporarily pass out (faint). Signs that you may be about to pass out include: °· Feeling dizzy or light-headed. °· Feeling sick to your stomach (nauseous). °· Seeing all white or all black. °· Having cold, clammy skin. °If you passed out, get help right away. Call your local emergency services (911 in the [place] Do not drive yourself to the hospital. °Follow these instructions at home: °Pay attention to any changes in your symptoms. Take these actions to help with your condition: °· Have someone stay with you until you feel stable. °· Do not drive, use machinery, or play sports until your doctor says it is okay. °· Keep all follow-up visits as told by your doctor. This is important. °· If you start to feel like you might pass out, lie down right away and raise (elevate) your feet above the level of your heart. Breathe deeply and steadily. Wait until all of the symptoms are gone. °· Drink enough fluid to keep your pee (urine) clear or pale yellow. °· If you are taking blood pressure or heart medicine, get up slowly and spend many minutes getting ready to sit and then stand. This can help with dizziness. °· Take over-the-counter and prescription medicines only as told by your doctor. °Get help right away if: °· You have a very bad headache. °· You have unusual pain in your chest, tummy, or back. °· You are bleeding from your mouth or rectum. °· You have black or tarry poop (stool). °· You have a very fast or uneven heartbeat (palpitations). °· It hurts to breathe. °· You pass out once or more than once. °· You have jerky movements that you cannot control (seizure). °· You are confused. °· You have trouble walking. °· You are very weak. °· You have vision problems. °These symptoms may be an emergency. Do not wait to see if the symptoms will go away. Get medical help right away. Call your local emergency services (911 in the [place] Do not drive yourself to the hospital.    °This information is not intended to replace advice given to you by your health care provider. Make sure you discuss any questions you have with your health care provider. °Document Released: 07/05/2007 Document Revised: 06/24/2015 Document Reviewed: 09/30/2014 °© 2017 [employer] ° °

## 2016-02-28 NOTE — Evaluation (Signed)
Occupational Therapy Evaluation Patient Details Name: Shannon [name] MRN: [id number] DOB: Nov 06, 1945 Today's Date: 02/28/2016    History of Present Illness 71 y.o.femalewith a medical history of hypertension, anxiety, nonischemic cardiomyopathy, who presented to the emergency department with complaints of chest pain and passing out.    Clinical Impression   Patient evaluated by Occupational Therapy with no further acute OT needs identified. All education has been completed and the patient has no further questions.  See below for any follow-up Occupational Therapy or equipment needs. OT is signing off. Thank you for this referral.      Follow Up Recommendations  No OT follow up;Supervision - Intermittent    Equipment Recommendations  None recommended by OT    Recommendations for Other Services       Precautions / Restrictions Precautions Precautions: None Restrictions Weight Bearing Restrictions: No      Mobility Bed Mobility Overal bed mobility: Independent             General bed mobility comments: Pt sitting EOB upon arrival  Transfers Overall transfer level: Needs assistance Equipment used: None Transfers: Sit to/from Stand;Stand Pivot Transfers Sit to Stand: Independent Stand pivot transfers: Independent       General transfer comment: No LOB.  noted     Balance Overall balance assessment: No apparent balance deficits (not formally assessed) Sitting-balance support: No upper extremity supported Sitting balance-Leahy Scale: Good     Standing balance support: No upper extremity supported Standing balance-Leahy Scale: Fair Standing balance comment: Pt able to retrieve items from floor and perform shower simulated in standing without LOB                             ADL Overall ADL's : Modified independent                                       General ADL Comments: Pt reports she frequently hurries, and knows that has  caused falls in the past.  Discussed pacing self and planning ahead.  Also discussed use of shower seat if she has any dizziness.  was able to simulate shower in standing independently      Vision     Perception     Praxis      Pertinent Vitals/Pain Pain Assessment: No/denies pain     Hand Dominance     Extremity/Trunk Assessment Upper Extremity Assessment Upper Extremity Assessment: Overall WFL for tasks assessed   Lower Extremity Assessment Lower Extremity Assessment: Defer to PT evaluation   Cervical / Trunk Assessment Cervical / Trunk Assessment: Normal   Communication Communication Communication: No difficulties   Cognition Arousal/Alertness: Awake/alert Behavior During Therapy: WFL for tasks assessed/performed Overall Cognitive Status: Within Functional Limits for tasks assessed                     General Comments       Exercises       Shoulder Instructions      Home Living Family/patient expects to be discharged to:: Private residence Living Arrangements: Spouse/significant other Available Help at Discharge: Family;Available 24 hours/day Type of Home: House Home Access: Stairs to enter [employer] of Steps: 3 Entrance Stairs-Rails: Right Home Layout: Multi-level;Able to live on main level with bedroom/bathroom     Bathroom Shower/Tub: Walk-in shower  Home Equipment: None;Shower seat          Prior Functioning/Environment Level of Independence: Independent                 OT Problem List: Decreased strength   OT Treatment/Interventions:      OT Goals(Current goals can be found in the care plan section) Acute Rehab OT Goals Patient Stated Goal: get home OT Goal Formulation: All assessment and education complete, DC therapy  OT Frequency:     Barriers to D/C:            Co-evaluation              End of Session    Activity Tolerance: Patient tolerated treatment well Patient left: in  bed;with call bell/phone within reach   Time: 1234-1256 OT Time Calculation (min): 22 min Charges:  OT General Charges $OT Visit: 1 Procedure OT Evaluation $OT Eval Low Complexity: 1 Procedure G-Codes: OT G-codes **NOT FOR INPATIENT CLASS** Functional Limitation: Self care Self Care Current Status [id number]): At least 1 percent but less than 20 percent impaired, limited or restricted Self Care Goal Status [id number]): At least 1 percent but less than 20 percent impaired, limited or restricted Self Care Discharge Status [phone]): At least 1 percent but less than 20 percent impaired, limited or restricted  Mayan Kloepfer M 02/28/2016, 1:20 PM

## 2016-02-28 NOTE — Discharge Summary (Signed)
Physician Discharge Summary  [name] [id number] DOB: Mar 03, 1945 DOA: 02/27/2016  PCP: Penni Homans, MD  Admit date: 02/27/2016 Discharge date: 02/28/2016  Time spent: 45 minutes  Recommendations for Outpatient Follow-up:  Patient will be discharged to home.  Patient will need to follow up with primary care provider within one week of discharge.  Patient should continue medications as prescribed.  Patient should follow a heart healthy diet.   Discharge Diagnoses:  Chest pain Syncope Acute kidney injury Hypotension Essential hypertension History of nonischemic cardiopathy/chronic diastolic heart failure Anxiety/Depression Neuropathy  Discharge Condition: Stable  Diet recommendation:  heart healthy  Filed Weights   02/27/16 1332 02/27/16 1957 02/28/16 0342  Weight: 90.7 kg (200 lb) 95.8 kg (211 lb 4.8 oz) 95.3 kg (210 lb)    History of present illness:  On 02/24/2016 Shannon [name] is a 71 y.o. female with a medical history of hypertension, anxiety, nonischemic cardiomyopathy, who presented to the emergency department with complaints of chest pain and passing out. Patient states she woke this morning with some chest pain and took a nitroglycerin tablet. She was interested to church, and had jumped into the shower. Patient started to feel lightheaded. She was able to get out of the shower and get dressed before passing out. She states she can't remember how long she passed out for. She does her mother hitting her head. Patient states she has had chest pain which has been ongoing for quite some time. It is stabbing in nature and located in the center of her chest. Patient experiences no other symptoms. She feels as this could be acid reflux. Patient did try to get up after she fell today. Her blood pressure at home was noted to be in the [id number] systolic. She was brought to the emergency department by EMS and started on IV fluids. Patient does note a change in her diet, she has been  trying to eat healthier. She also states that she recently started taking hydrochlorothiazide a few weeks ago. She has noted that she's been dizzy lately. Patient denies any recent weight gain, recent illness, recent travel or ill contacts. Denies any shortness of breath, abdominal pain, nausea or vomiting, diarrhea or constipation, headache.  Hospital Course:  Chest pain -Patient has had atypical chest pain the past and she sees Dr. Stanford Breed. -?GERD -EKG unremarkable for ischemia, troponins cycled and negative -Continue statin, aspirin -held coreg due to hypotension, bradycardia -Restart coreg and micardis  Syncope -Likely secondary to nitroglycerin, BP meds, hypotension -Orthostatics unremarkable -Patient took nitroglycerin along with her other BP meds, then followed by syncopal episode approximately one hour later. -Echocardiogram 12/14/2015 showed an EF [id number], grade 2 diastolic dysfunction -Carotid Doppler: 1-39% ICA plaquing. Vertebral artery flow is antegrade -Neuro checks unremarkable -CT head unremarkable for acute findings -PT consulted, no therapy needs  Acute kidney injury -Likely secondary to medications versus dehydration -Patient's creatinine 1.29 on admission -Given IVF -Creatinine today 0.91  Hypotension -Likely secondary to medications, dehydration -Will hold patient's blood pressure medicines at this time -BP improved with IV fluid  Essential hypertension -Patient presented with hypotension, held Micardis, Coreg, HCTZ -Of note patient recently started HCTZ a few weeks ago and has been feeling dizzy since. -Patient may restart Coreg and micardis at reduced doses.  Explained holding parameters with patient.   -Follow up with cardiology in one week.   History of nonischemic cardiopathy/chronic diastolic heart failure -Echocardiogram 12/14/2015 showed EF of 0000000, grade 2 diastolic dysfunction -Held Micardis and Coreg during hospitalization.  Can restart at  reduced doses upon discharge.   -Follow up with cardiology in one week.  -Patient appears to be compensated -Monitor intake and output, daily weights  Anxiety/Depression -Continue Klonopin PRN, venlafaxine  Neuropathy -Continue gabapentin  Procedures: Carotid doppler  Consultations: None  Discharge Exam: Vitals:   02/28/16 0342 02/28/16 0829  BP: (!) 97/55 108/63  Pulse: 68 69  Resp: 18 18  Temp: 98.4 F (36.9 C) 98.1 F (36.7 C)   Patient states she is feeling better today. Denies current chest pain, shortness of breath, abdominal pain, N/V/D/C.  Denies headache.    General: Well developed, well nourished, NAD, appears stated age  46: NCAT,mucous membranes moist.  Neck: Supple, no JVD, no masses  Cardiovascular: S1 S2 auscultated, no rubs, murmurs or gallops. Regular rate and rhythm.  Respiratory: Clear to auscultation bilaterally with equal chest rise  Abdomen: Soft, obese, nontender, nondistended, + bowel sounds  Extremities: warm dry without cyanosis clubbing or edema  Neuro: AAOx3, nonfocal  Psych: Normal affect and demeanor with intact judgement and insight, pleasant  Discharge Instructions Discharge Instructions    Discharge instructions    Complete by:  As directed    Patient will be discharged to home.  Patient will need to follow up with primary care provider within one week of discharge.  Patient should continue medications as prescribed.  Patient should follow a heart healthy diet.     Current Discharge Medication List    CONTINUE these medications which have CHANGED   Details  carvedilol (COREG) 25 MG tablet Take 0.5 tablets (12.5 mg total) by mouth 2 (two) times daily with a meal. Hold if your blood pressure is <120/80 Qty: 30 tablet, Refills: 0    telmisartan (MICARDIS) 80 MG tablet Take 0.5 tablets (40 mg total) by mouth daily. Hold if your blood pressure is <120/80 Qty: 30 tablet, Refills: 0      CONTINUE these medications which  have NOT CHANGED   Details  acetaminophen (TYLENOL) 500 MG tablet Take 500-1,000 mg by mouth every 6 (six) hours as needed for moderate pain or headache.     aspirin EC 81 MG tablet Take 81 mg by mouth at bedtime.    B Complex-C (SUPER B COMPLEX PO) Take 1 tablet by mouth daily.    Cholecalciferol 2000 UNITS CAPS Take 2,000 Units by mouth daily.    clonazePAM (KLONOPIN) 1 MG tablet TAKE 1/2 to 1 TABLET BY MOUTH AT BEDTIME. Qty: 30 tablet, Refills: 5    ferrous sulfate 325 (65 FE) MG tablet Take 325 mg by mouth daily with breakfast.    folic acid (FOLVITE) 1 MG tablet TAKE ONE TABLET (1 MG TOTAL) BY MOUTH DAILY. Qty: 90 tablet, Refills: 3    gabapentin (NEURONTIN) 300 MG capsule Take 1 capsule (300 mg total) by mouth 2 (two) times daily. Reported on 05/24/2015 Qty: 180 capsule, Refills: 1    hydrOXYzine (ATARAX/VISTARIL) 25 MG tablet Take 25 mg by mouth at bedtime.    Multiple Vitamin (MULTIVITAMIN WITH MINERALS) TABS tablet Take 1 tablet by mouth daily.    omeprazole (PRILOSEC) 20 MG capsule Take 20 mg by mouth daily as needed (heartburn).    OVER THE COUNTER MEDICATION Place 1 drop into both eyes 2 (two) times daily as needed (dry eyes/ irritation). Over the counter lubricating eye drop    pentosan polysulfate (ELMIRON) 100 MG capsule Take 100 mg by mouth 2 (two) times daily.     RA KRILL OIL 500 MG CAPS  Take 500 mg by mouth daily.     ranitidine (ZANTAC) 150 MG tablet Take 1 tablet (150 mg total) by mouth 2 (two) times daily as needed for heartburn. Qty: 180 tablet, Refills: 1    rosuvastatin (CRESTOR) 20 MG tablet TAKE 1 TABLET (20 MG TOTAL) BY MOUTH AT BEDTIME. Qty: 90 tablet, Refills: 3    Venlafaxine HCl 225 MG TB24 Take 1 tablet (225 mg total) by mouth daily. Qty: 30 each, Refills: 3   Associated Diagnoses: Encounter for immunization      STOP taking these medications     hydrochlorothiazide (MICROZIDE) 12.5 MG capsule      nitroGLYCERIN (NITROSTAT) 0.4 MG SL  tablet      Ferrous Fumarate (FERROCITE) 324 (106 Fe) MG TABS tablet      aspirin 81 MG tablet        Allergies  Allergen Reactions  . Dilaudid [Hydromorphone Hcl] Itching   Follow-up Information    Penni Homans, MD. Schedule an appointment as soon as possible for a visit in 1 week(s).   Specialty:  Family Medicine Why:  Hopital follow up Contact information: [address] [place] [address] [phone]        Kirk Ruths, MD. Go to.   Specialty:  Cardiology Why:  Scheduled appointment. Discuss chest pain and blood pressure management. Contact information: [address] [place] [address] [phone]            The results of significant diagnostics from this hospitalization (including imaging, microbiology, ancillary and laboratory) are listed below for reference.    Significant Diagnostic Studies: Dg Chest 2 View  Result Date: 02/27/2016 CLINICAL DATA:  Patient status post fall from shower. Chest tightness. EXAM: CHEST  2 VIEW COMPARISON:  Chest CT 05/26/2015; chest radiograph 05/24/2015. FINDINGS: Normal cardiac and mediastinal contours. Elevation of the right hemidiaphragm. Minimal basilar atelectasis. No pleural effusion or pneumothorax. Mid thoracic spine degenerative changes. Cholecystectomy clips. IMPRESSION: No active cardiopulmonary disease. Electronically Signed   By: Lovey Newcomer M.D.   On: 02/27/2016 15:49   Ct Head Wo Contrast  Result Date: 02/27/2016 CLINICAL DATA:  Syncope, head injury EXAM: CT HEAD WITHOUT CONTRAST TECHNIQUE: Contiguous axial images were obtained from the base of the skull through the vertex without intravenous contrast. COMPARISON:  05/17/2015 FINDINGS: Brain: No acute intracranial abnormality. Specifically, no hemorrhage, hydrocephalus, mass lesion, acute infarction, or significant intracranial injury. Vascular: No hyperdense vessel or unexpected calcification. Skull: No acute calvarial  abnormality. Prior right frontotemporal craniotomy. Sinuses/Orbits: Visualized paranasal sinuses and mastoids clear. Orbital soft tissues unremarkable. Other: None IMPRESSION: No acute intracranial abnormality. Electronically Signed   By: Rolm Baptise M.D.   On: 02/27/2016 15:30    Microbiology: No results found for this or any previous visit (from the past 240 hour(s)).   Labs: Basic Metabolic Panel:  Recent Labs Lab 02/27/16 1458 02/27/16 2035 02/28/16 0644  NA 141  --  142  K 4.8  --  4.6  CL 105  --  109  CO2 25  --  28  GLUCOSE 115*  --  96  BUN 20  --  13  CREATININE 1.29* 1.10* 0.91  CALCIUM 9.6  --  8.8*  MG  --   --  1.8   Liver Function Tests: No results for input(s): AST, ALT, ALKPHOS, BILITOT, PROT, ALBUMIN in the last 168 hours. No results for input(s): LIPASE, AMYLASE in the last 168 hours. No results for input(s): AMMONIA in  the last 168 hours. CBC:  Recent Labs Lab 02/27/16 1458 02/27/16 2035 02/28/16 0644  WBC 9.6 8.8 6.9  HGB 12.2 11.9* 11.1*  HCT 38.9 37.0 34.7*  MCV 90.9 91.1 91.3  PLT 196 203 204   Cardiac Enzymes:  Recent Labs Lab 02/27/16 1458 02/27/16 2035 02/28/16 0644  TROPONINI <0.03 <0.03 <0.03   BNP: BNP (last 3 results) No results for input(s): BNP in the last 8760 hours.  ProBNP (last 3 results) No results for input(s): PROBNP in the last 8760 hours.  CBG:  Recent Labs Lab 02/27/16 1321 02/28/16 0827  GLUCAP 101* 99       Signed:  Corrie Brannen  [facility] 02/28/2016, 11:31 AM

## 2016-02-29 ENCOUNTER — Ambulatory Visit
Admission: RE | Admit: 2016-02-29 | Discharge: 2016-02-29 | Disposition: A | Discharge status: Home / Self Care | Payer: Medicare Other | Source: Ambulatory Visit | Attending: Oncology | Admitting: Oncology

## 2016-02-29 ENCOUNTER — Other Ambulatory Visit: Payer: Self-pay | Admitting: Family Medicine

## 2016-02-29 DIAGNOSIS — C50311 Malignant neoplasm of lower-inner quadrant of right female breast: Secondary | ICD-10-CM

## 2016-02-29 DIAGNOSIS — Z171 Estrogen receptor negative status [ER-]: Principal | ICD-10-CM

## 2016-02-29 DIAGNOSIS — N63 Unspecified lump in unspecified breast: Secondary | ICD-10-CM

## 2016-02-29 DIAGNOSIS — N6314 Unspecified lump in the right breast, lower inner quadrant: Secondary | ICD-10-CM | POA: Diagnosis not present

## 2016-02-29 MED ORDER — CIPROFLOXACIN HCL 250 MG PO TABS: 250.0000 mg | ORAL_TABLET | Freq: Two times a day (BID) | ORAL | 5 refills | Status: DC

## 2016-03-01 ENCOUNTER — Other Ambulatory Visit: Payer: Self-pay | Admitting: Family Medicine

## 2016-03-01 ENCOUNTER — Telehealth: Payer: Self-pay

## 2016-03-01 LAB — URINE CULTURE: Culture: 80000 — AB

## 2016-03-01 NOTE — Telephone Encounter (Signed)
03/01/16  Transition Care Management Follow-up Telephone Call  ADMISSION DATE: 02/27/16 DISCHARGE DATE: 02/28/16    How have you been since you were released from the hospital? Patient having soreness in back.     Do you understand why you were in the hospital? YES   Do you understand the discharge instrcutions? Yes   Items Reviewed:  Medications reviewed:  Medications reviewed with patient..  Allergies reviewed: Reviewed with patient.   Dietary changes reviewed:Heart HEALTHY  Referrals reviewed:Will follow up with [facility] as well    Functional Questionnaire:   Activities of Daily Living (ADLs):  No help needed at this time   Any transportation issues/concerns?: No   Any patient concerns?Would like to understand why she fell..   Confirmed importance and date/time of follow-up visits scheduled: Yes    Confirmed with patient if condition begins to worsen call PCP or go to the ER. Yes    Patient was given the [place] line [phone]: Yes

## 2016-03-03 ENCOUNTER — Ambulatory Visit: Payer: Medicare Other | Admitting: Medical

## 2016-03-06 ENCOUNTER — Ambulatory Visit (INDEPENDENT_AMBULATORY_CARE_PROVIDER_SITE_OTHER): Payer: Medicare Other | Admitting: Family Medicine

## 2016-03-06 ENCOUNTER — Ambulatory Visit (HOSPITAL_BASED_OUTPATIENT_CLINIC_OR_DEPARTMENT_OTHER)
Admission: RE | Admit: 2016-03-06 | Discharge: 2016-03-06 | Disposition: A | Discharge status: Home / Self Care | Payer: Medicare Other | Source: Ambulatory Visit | Attending: Family Medicine | Admitting: Family Medicine

## 2016-03-06 VITALS — BP 96/70 | HR 87 | Temp 98.3°F | Wt 207.4 lb

## 2016-03-06 DIAGNOSIS — M545 Low back pain, unspecified: Secondary | ICD-10-CM

## 2016-03-06 DIAGNOSIS — M5105 Intervertebral disc disorders with myelopathy, thoracolumbar region: Secondary | ICD-10-CM | POA: Diagnosis not present

## 2016-03-06 DIAGNOSIS — R55 Syncope and collapse: Secondary | ICD-10-CM

## 2016-03-06 DIAGNOSIS — N390 Urinary tract infection, site not specified: Secondary | ICD-10-CM | POA: Diagnosis not present

## 2016-03-06 DIAGNOSIS — R296 Repeated falls: Secondary | ICD-10-CM | POA: Diagnosis not present

## 2016-03-06 DIAGNOSIS — M5136 Other intervertebral disc degeneration, lumbar region: Secondary | ICD-10-CM | POA: Diagnosis not present

## 2016-03-06 DIAGNOSIS — M549 Dorsalgia, unspecified: Secondary | ICD-10-CM | POA: Insufficient documentation

## 2016-03-06 DIAGNOSIS — I10 Essential (primary) hypertension: Secondary | ICD-10-CM

## 2016-03-06 HISTORY — DX: Dorsalgia, unspecified: M54.9

## 2016-03-06 LAB — URINALYSIS, ROUTINE W REFLEX MICROSCOPIC
Bilirubin Urine: NEGATIVE
HGB URINE DIPSTICK: NEGATIVE
KETONES UR: NEGATIVE
NITRITE: NEGATIVE
PH: 7 (ref 5.0–8.0)
Specific Gravity, Urine: <=1.005 — AB (ref 1.000–1.030)
Total Protein, Urine: NEGATIVE
Urine Glucose: NEGATIVE
Urobilinogen, UA: 0.2 (ref 0.0–1.0)

## 2016-03-06 MED ORDER — SULFAMETHOXAZOLE-TRIMETHOPRIM 800-160 MG PO TABS: 1.0000 | ORAL_TABLET | Freq: Two times a day (BID) | ORAL | 0 refills | Status: DC

## 2016-03-06 NOTE — Assessment & Plan Note (Signed)
Worse this week possibly related to recent fall, but she reports it feels more like pain she has had in past related to UTI, will check xray and urine

## 2016-03-06 NOTE — Progress Notes (Signed)
Pre visit review using our clinic review tool, if applicable. No additional management support is needed unless otherwise documented below in the visit note. 

## 2016-03-06 NOTE — Progress Notes (Signed)
HPI: FU nonischemic cardiomyopathy. Patient had an echocardiogram in 2012 ejection fraction of 45-50%. Reduced LV function felt secondary to chemotherapy (herceptin). Nuclear study October 2015 showed an ejection fraction of 64%. There is a small area of moderate apical ischemia report. Cardiac catheterization November 2015 showed no obstructive coronary disease. Ejection fraction 50-55%. Last echocardiogram November 2017 showed normal LV systolic function, grade 2 diastolic dysfunction and mild left atrial enlargement. Carotid Dopplers January 2018 showed 1-39% bilateral stenosis. Patient admitted with syncope 02/28/2016. This apparently occurred after having chest pain and taking nitroglycerin. Her initial blood pressure was in the 70s. Patient ruled out with serial enzymes. Since last seen, patient does have dyspnea on exertion. She denies orthopnea, PND, pedal edema. She continues to have occasional chest pain in the substernal area predominantly at night. No exertional symptoms.  Current Outpatient Prescriptions  Medication Sig Dispense Refill  . acetaminophen (TYLENOL) 500 MG tablet Take 500-1,000 mg by mouth every 6 (six) hours as needed for moderate pain or headache.     Marland Kitchen aspirin EC 81 MG tablet Take 81 mg by mouth at bedtime.    . B Complex-C (SUPER B COMPLEX PO) Take 1 tablet by mouth daily.    . carvedilol (COREG) 25 MG tablet Take 0.5 tablets (12.5 mg total) by mouth 2 (two) times daily with a meal. Hold if your blood pressure is <120/80 30 tablet 0  . Cholecalciferol 2000 UNITS CAPS Take 2,000 Units by mouth daily.    . clonazePAM (KLONOPIN) 1 MG tablet TAKE 1/2 to 1 TABLET BY MOUTH AT BEDTIME. (Patient taking differently: Take 0.5 mg by mouth at bedtime. ) 30 tablet 5  . ferrous sulfate 325 (65 FE) MG tablet Take 325 mg by mouth daily with breakfast.    . folic acid (FOLVITE) 1 MG tablet TAKE ONE TABLET (1 MG TOTAL) BY MOUTH DAILY. (Patient taking differently: Take 1 mg by mouth  daily. ) 90 tablet 3  . gabapentin (NEURONTIN) 300 MG capsule Take 1 capsule (300 mg total) by mouth 2 (two) times daily. Reported on 05/24/2015 180 capsule 1  . hydrOXYzine (ATARAX/VISTARIL) 25 MG tablet Take 25 mg by mouth at bedtime.    . Multiple Vitamin (MULTIVITAMIN WITH MINERALS) TABS tablet Take 1 tablet by mouth daily.    Marland Kitchen omeprazole (PRILOSEC) 20 MG capsule Take 20 mg by mouth daily as needed (heartburn).    Marland Kitchen OVER THE COUNTER MEDICATION Place 1 drop into both eyes 2 (two) times daily as needed (dry eyes/ irritation). Over the counter lubricating eye drop    . pentosan polysulfate (ELMIRON) 100 MG capsule Take 100 mg by mouth 2 (two) times daily.     Marland Kitchen RA KRILL OIL 500 MG CAPS Take 500 mg by mouth daily.     . ranitidine (ZANTAC) 150 MG tablet TAKE 1 TABLET (150 MG TOTAL) BY MOUTH 2 (TWO) TIMES DAILY AS NEEDED FOR HEARTBURN. 180 tablet 1  . rosuvastatin (CRESTOR) 20 MG tablet TAKE 1 TABLET (20 MG TOTAL) BY MOUTH AT BEDTIME. (Patient taking differently: Take 20 mg by mouth at bedtime. ) 90 tablet 3  . telmisartan (MICARDIS) 80 MG tablet Take 0.5 tablets (40 mg total) by mouth daily. Hold if your blood pressure is <120/80 30 tablet 0  . Venlafaxine HCl 225 MG TB24 Take 1 tablet (225 mg total) by mouth daily. (Patient taking differently: Take 225 mg by mouth at bedtime. ) 30 each 3   No current facility-administered medications for  this visit.      Past Medical History:  Diagnosis Date  . 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 Anemia 08/28/2011  . Asthma   . Brain tumor ([place])   . Breast cancer ([place])   . Carpal tunnel syndrome of left wrist   . Cataracts, bilateral 12/28/2015  . Constipation 03/03/2012  . DEPRESSION 08/11/2008  . Dermatitis   . Facial fracture ([place])   . GERD 08/11/2008  . H/O measles   . H/O mumps   . History of blood transfusion   . History of chicken pox   . Hx breast cancer, IDC, Right, 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/2009   BRCA 2 NEGATIVE, CHEMO AND  RADIATION X 1 YR  . HYPERLIPIDEMIA 08/11/2008  . HYPERTENSION 08/11/2008  . Hyponatremia 08/28/2011  . 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 Interstitial cystitis   . LIPOMA 02/10/2009  . Medicare annual wellness visit, subsequent 12/27/2014   Follows with Dr Amalia Hailey of urology Follows with Dr Posey Pronto at [facility] for opthamology Follows with Dr [name] of dermatology Follows with [facility] gastroenterology, last scope done in 2011 repeat in 10 years Last [facility] July 2015 should repeat every 1-2 years Last Pap December 2015, repeat in 3 years.   . Melanoma ([place]) 2008   DR. Ronnald Ramp  . Neuropathy ([place])   . NICM (nonischemic cardiomyopathy) ([place]) 03/08/2010   likely 2/2 chemotx - a. Echo 2012: EF 45-50%;  b. Lex MV 2/12:  low risk, apical defect (small area of ischemia vs shifting breast atten);  c.  Echo 7/12: Normal wall thickness, EF 60-65%, normal wall motion, grade 1 diastolic dysfunction, mild LAE, PASP 32;   d. Lex MV 11/13:  EF 76%, no ischemia  . OA (osteoarthritis) 09/07/2011  . Obesity 09/28/2014  . Osteoporosis   . PREDIABETES 06/15/2009  . Recurrent falls 12/28/2015  . Shortness of breath   . UTI (lower urinary tract infection) 03/03/2012    Past Surgical History:  Procedure Laterality Date  . BREAST LUMPECTOMY  04/2009   RIGHT FOR BREAST CANCER-CHEMO/RADIATION X 1 YEAR  . BREAST REDUCTION SURGERY Bilateral 05/04/2014   Procedure: MAMMARY REDUCTION  (BREAST);  Surgeon: Cristine Polio, MD;  Location: [facility];  Service: Plastics;  Laterality: Bilateral;  . CARPAL TUNNEL RELEASE     L wrist, ulnar nerve moved  . CHOLECYSTECTOMY    . CRANIOTOMY FOR TUMOR  2000  . ELBOW SURGERY     left  . KNEE ARTHROSCOPY Left 10/12/2014   Procedure: LEFT KNEE ARTHROSCOPY ;  Surgeon: Gaynelle Arabian, MD;  Location: [facility];  Service: Orthopedics;  Laterality: Left;  . LEFT HEART CATHETERIZATION WITH CORONARY ANGIOGRAM N/A 12/16/2013   Procedure: LEFT HEART CATHETERIZATION WITH CORONARY ANGIOGRAM;   Surgeon: Peter M [name], MD;  Location: [facility] CATH LAB;  Service: Cardiovascular;  Laterality: N/A;  . LIPOMA EXCISION  03/28/2009   right leg  . MELANOMA EXCISION    . PORT-A-CATH REMOVAL  11/30/2010   Streck  . porta cath    . PORTACATH PLACEMENT  may 2011  . SYNOVECTOMY Left 10/12/2014   Procedure: WITH SYNOVECTOMY;  Surgeon: Gaynelle Arabian, MD;  [facility];  Service: Orthopedics;  Laterality: Left;  . TONSILLECTOMY  1958  . TOTAL KNEE ARTHROPLASTY  02/05/2012   Procedure: TOTAL KNEE ARTHROPLASTY;  Surgeon: Gearlean Alf, MD;  [facility];  Service: Orthopedics;  Laterality: Right;  . TOTAL KNEE ARTHROPLASTY Left 02/10/2013   Procedure: LEFT TOTAL  KNEE ARTHROPLASTY;  Surgeon: Gearlean Alf, MD;  [facility]: [facility];  Service: Orthopedics;  Laterality: Left;  . total knee raplacement  01-2012   Right Knee  . TUBAL LIGATION  1997    Social History   Social History  . Marital status: Married    Spouse name: N/A  . Number of children: 3  . Years of education: N/A   Occupational History  . boutique owner [employer]   Social History Main Topics  . Smoking status: Never Smoker  . Smokeless tobacco: Never Used  . Alcohol use No  . Drug use: No  . Sexual activity: No   Other Topics Concern  . Not on file   Social History Narrative  . No narrative on file    Family History  Problem Relation Age of Onset  . Breast cancer Mother     sarcoma  . Lung cancer Mother   . Hypertension Mother   . Prostate cancer Father   . Congestive Heart Failure Father   . Heart attack Father   . Prostate cancer Brother   . Down syndrome Son   . Heart disease Maternal Grandfather     MI  . Stomach cancer Maternal Aunt   . Uterine cancer Maternal Aunt   . Colon cancer Neg Hx     JKD:TOIZ pain but  no fevers or chills, productive cough, hemoptysis, dysphasia, odynophagia, melena, hematochezia, dysuria, hematuria, rash, seizure activity, orthopnea, PND, pedal edema, claudication.  Remaining systems are negative.  Physical Exam: Well-developed obese in no acute distress.  Skin is warm and dry.  HEENT is normal.  Neck is supple.  Chest is clear to auscultation with normal expansion.  Cardiovascular exam is regular rate and rhythm.  Abdominal exam nontender or distended. No masses palpated. Extremities show no edema. neuro grossly intact  ECG-02/27/2016-sinus rhythm with no ST changes.  A/P  1 nonischemic cardiomyopathy-improved on most recent echocardiogram. Continue ARB and beta blocker.  2 hypertension-patient's blood pressure was running low previously and her mycardis and Coreg have been decreased. She now states her systolic is in the 124 to 130 range. She will follow this and we will adjust regimen as needed.  3 hyperlipidemia-continue statin.  4 syncope-this occurred in the setting of chest pain and then taking nitroglycerin. Likely hypotension mediated. LV function normal. Will not pursue further evaluation unless recurrent events.  5 chest pain-symptoms are atypical and chronic. Previous catheterization negative. We will not pursue further ischemia evaluation.  Kirk Ruths, MD

## 2016-03-06 NOTE — Assessment & Plan Note (Signed)
Syncopal episode last week, extensive work up negative for anything except uti

## 2016-03-06 NOTE — Assessment & Plan Note (Signed)
Recent UTI treated with antibiotics. Will recheck urine sample today. AZO cranberry, probiotics daily, hydrate

## 2016-03-06 NOTE — Progress Notes (Signed)
Patient ID: Shannon [name], female   DOB: 12/02/1945, 71 y.o.   MRN: [id number]   Subjective:    Patient ID: Shannon [name], female    DOB: 16-Sep-1945, 71 y.o.   MRN: [id number]  Chief Complaint  Patient presents with  . Hospitalization Follow-up  . Abdominal Pain  . Back Pain   I acted as a Education administrator for Dr. Charlett Blake. [name], RMA Back Pain  This is a new problem. The current episode started in the past 7 days. The problem occurs constantly. The problem has been gradually worsening since onset. The quality of the pain is described as aching and cramping. The pain is at a severity of 9/10. The symptoms are aggravated by bending, lying down and standing. Stiffness is present all day. Associated symptoms include abdominal pain and leg pain. Pertinent negatives include no chest pain, dysuria, fever or headaches. Risk factors include recent trauma [place] at home). She has tried heat for the symptoms.  Abdominal Pain  This is a new problem. The onset quality is sudden. The pain is located in the generalized abdominal region. The pain is at a severity of 9/10. The quality of the pain is aching and cramping. Associated symptoms include constipation. Pertinent negatives include no diarrhea, dysuria, fever, frequency, headaches, hematuria or vomiting. The pain is aggravated by deep breathing and coughing. Relieved by: Heating pad.    Patient is in today for a hospital follow up. Patient was admitted to the hospital las Sunday for LOC, patient  Complains today  of back and abdominal pain patient states she fell at home earlier this week and has not felt the same, no syncope. After her recent hospitalization her work up was noted to be negative except for UTI which was treated with Ciprofloxacin. She denies fevers, chills. No bloody or tarry stool but she has not moved her bowels in 3 days which is unusual for her.    Past Medical History:  Diagnosis Date  . 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 Anemia  08/28/2011  . Asthma   . Brain tumor ([place])   . Breast cancer ([place])   . Carpal tunnel syndrome of left wrist   . Cataracts, bilateral 12/28/2015  . Constipation 03/03/2012  . DEPRESSION 08/11/2008  . Dermatitis   . Facial fracture ([place])   . GERD 08/11/2008  . H/O measles   . H/O mumps   . History of blood transfusion   . History of chicken pox   . 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 HYPERLIPIDEMIA 08/11/2008  . HYPERTENSION 08/11/2008  . Hyponatremia 08/28/2011  . 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 Interstitial cystitis   . LIPOMA 02/10/2009  . Medicare annual wellness visit, subsequent 12/27/2014   Follows with Dr Amalia Hailey of urology Follows with Dr Posey Pronto at [facility] for opthamology Follows with Dr [name] of dermatology Follows with [facility] gastroenterology, last scope done in 2011 repeat in 10 years Last [facility] July 2015 should repeat every 1-2 years Last Pap December 2015, repeat in 3 years.   . Melanoma ([place]) 2008   DR. Ronnald Ramp  . Neuropathy ([place])   . NICM (nonischemic cardiomyopathy) ([place]) 03/08/2010   likely 2/2 chemotx - a. Echo 2012: EF 45-50%;  b. Lex MV 2/12:  low risk, apical defect (small area of ischemia vs shifting breast atten);  c.  Echo 7/12: Normal wall thickness, EF  60-65%, normal wall motion, grade 1 diastolic dysfunction, mild LAE, PASP 32;   d. Lex MV 11/13:  EF 76%, no ischemia  . OA (osteoarthritis) 09/07/2011  . Obesity 09/28/2014  . Osteoporosis   . PREDIABETES 06/15/2009  . Recurrent falls 12/28/2015  . Shortness of breath   . UTI (lower urinary tract infection) 03/03/2012    Past Surgical History:  Procedure Laterality Date  . BREAST LUMPECTOMY  04/2009   RIGHT FOR BREAST CANCER-CHEMO/RADIATION X 1 YEAR  . BREAST REDUCTION SURGERY Bilateral 05/04/2014   Procedure: MAMMARY REDUCTION  (BREAST);  Surgeon: Cristine Polio, MD;  Location: [facility];  Service: Plastics;  Laterality:  Bilateral;  . CARPAL TUNNEL RELEASE     L wrist, ulnar nerve moved  . CHOLECYSTECTOMY    . CRANIOTOMY FOR TUMOR  2000  . ELBOW SURGERY     left  . KNEE ARTHROSCOPY Left 10/12/2014   Procedure: LEFT KNEE ARTHROSCOPY ;  Surgeon: Gaynelle Arabian, MD;  [facility];  Service: Orthopedics;  Laterality: Left;  . LEFT HEART CATHETERIZATION WITH CORONARY ANGIOGRAM N/A 12/16/2013   Procedure: LEFT HEART CATHETERIZATION WITH CORONARY ANGIOGRAM;  Surgeon: Peter M [name], MD;  Location: [facility] CATH LAB;  Service: Cardiovascular;  Laterality: N/A;  . LIPOMA EXCISION  03/28/2009   right leg  . MELANOMA EXCISION    . PORT-A-CATH REMOVAL  11/30/2010   Streck  . porta cath    . PORTACATH PLACEMENT  may 2011  . SYNOVECTOMY Left 10/12/2014   Procedure: WITH SYNOVECTOMY;  Surgeon: Gaynelle Arabian, MD;  [facility];  Service: Orthopedics;  Laterality: Left;  . TONSILLECTOMY  1958  . TOTAL KNEE ARTHROPLASTY  02/05/2012   Procedure: TOTAL KNEE ARTHROPLASTY;  Surgeon: Gearlean Alf, MD;  [facility];  Service: Orthopedics;  Laterality: Right;  . TOTAL KNEE ARTHROPLASTY Left 02/10/2013   Procedure: LEFT TOTAL KNEE ARTHROPLASTY;  Surgeon: Gearlean Alf, MD;  [facility];  Service: Orthopedics;  Laterality: Left;  . total knee raplacement  01-2012   Right Knee  . TUBAL LIGATION  1997    Family History  Problem Relation Age of Onset  . Breast cancer Mother     sarcoma  . Lung cancer Mother   . Hypertension Mother   . Prostate cancer Father   . Congestive Heart Failure Father   . Heart attack Father   . Prostate cancer Brother   . Down syndrome Son   . Heart disease Maternal Grandfather     MI  . Stomach cancer Maternal Aunt   . Uterine cancer Maternal Aunt   . Colon cancer Neg Hx     Social History   Social History  . Marital status: Married    Spouse name: N/A  . Number of children: 3  . Years of education: N/A   Occupational History  . boutique owner [employer]   Social  History Main Topics  . Smoking status: Never Smoker  . Smokeless tobacco: Never Used  . Alcohol use No  . Drug use: No  . Sexual activity: No   Other Topics Concern  . Not on file   Social History Narrative  . No narrative on file    Outpatient Medications Prior to Visit  Medication Sig Dispense Refill  . acetaminophen (TYLENOL) 500 MG tablet Take 500-1,000 mg by mouth every 6 (six) hours as needed for moderate pain or headache.     Marland Kitchen aspirin EC 81 MG tablet Take 81 mg  by mouth at bedtime.    . B Complex-C (SUPER B COMPLEX PO) Take 1 tablet by mouth daily.    . carvedilol (COREG) 25 MG tablet Take 0.5 tablets (12.5 mg total) by mouth 2 (two) times daily with a meal. Hold if your blood pressure is <120/80 30 tablet 0  . Cholecalciferol 2000 UNITS CAPS Take 2,000 Units by mouth daily.    . ciprofloxacin (CIPRO) 250 MG tablet Take 1 tablet (250 mg total) by mouth 2 (two) times daily. Take for 5 days 10 tablet 5  . clonazePAM (KLONOPIN) 1 MG tablet TAKE 1/2 to 1 TABLET BY MOUTH AT BEDTIME. (Patient taking differently: Take 0.5 mg by mouth at bedtime. ) 30 tablet 5  . ferrous sulfate 325 (65 FE) MG tablet Take 325 mg by mouth daily with breakfast.    . folic acid (FOLVITE) 1 MG tablet TAKE ONE TABLET (1 MG TOTAL) BY MOUTH DAILY. (Patient taking differently: Take 1 mg by mouth daily. ) 90 tablet 3  . gabapentin (NEURONTIN) 300 MG capsule Take 1 capsule (300 mg total) by mouth 2 (two) times daily. Reported on 05/24/2015 180 capsule 1  . hydrOXYzine (ATARAX/VISTARIL) 25 MG tablet Take 25 mg by mouth at bedtime.    . Multiple Vitamin (MULTIVITAMIN WITH MINERALS) TABS tablet Take 1 tablet by mouth daily.    Marland Kitchen omeprazole (PRILOSEC) 20 MG capsule Take 20 mg by mouth daily as needed (heartburn).    Marland Kitchen OVER THE COUNTER MEDICATION Place 1 drop into both eyes 2 (two) times daily as needed (dry eyes/ irritation). Over the counter lubricating eye drop    . pentosan polysulfate (ELMIRON) 100 MG capsule Take  100 mg by mouth 2 (two) times daily.     Marland Kitchen RA KRILL OIL 500 MG CAPS Take 500 mg by mouth daily.     . ranitidine (ZANTAC) 150 MG tablet TAKE 1 TABLET (150 MG TOTAL) BY MOUTH 2 (TWO) TIMES DAILY AS NEEDED FOR HEARTBURN. 180 tablet 1  . rosuvastatin (CRESTOR) 20 MG tablet TAKE 1 TABLET (20 MG TOTAL) BY MOUTH AT BEDTIME. (Patient taking differently: Take 20 mg by mouth at bedtime. ) 90 tablet 3  . telmisartan (MICARDIS) 80 MG tablet Take 0.5 tablets (40 mg total) by mouth daily. Hold if your blood pressure is <120/80 30 tablet 0  . Venlafaxine HCl 225 MG TB24 Take 1 tablet (225 mg total) by mouth daily. (Patient taking differently: Take 225 mg by mouth at bedtime. ) 30 each 3   No facility-administered medications prior to visit.     Allergies  Allergen Reactions  . Dilaudid [Hydromorphone Hcl] Itching    Review of Systems  Constitutional: Positive for malaise/fatigue. Negative for fever.  HENT: Negative for congestion.   Eyes: Negative for blurred vision.  Respiratory: Negative for cough and shortness of breath.   Cardiovascular: Negative for chest pain, palpitations and leg swelling.  Gastrointestinal: Positive for abdominal pain and constipation. Negative for diarrhea and vomiting.  Genitourinary: Negative for dysuria, flank pain, frequency, hematuria and urgency.  Musculoskeletal: Positive for back pain.  Skin: Negative for rash.  Neurological: Negative for loss of consciousness and headaches.       Objective:    Physical Exam  Constitutional: She is oriented to person, place, and time. She appears well-developed and well-nourished. No distress.  HENT:  Head: Normocephalic and atraumatic.  Eyes: Conjunctivae are normal.  Neck: Normal range of motion. No thyromegaly present.  Cardiovascular: Normal rate and regular rhythm.  Pulmonary/Chest: Effort normal and breath sounds normal. She has no wheezes.  Abdominal: Soft. Bowel sounds are normal. There is no tenderness.    Musculoskeletal: Normal range of motion. She exhibits no edema or deformity.  Neurological: She is alert and oriented to person, place, and time.  Skin: Skin is warm and dry. She is not diaphoretic.  Psychiatric: She has a normal mood and affect.    BP 96/70 (BP Location: Left Arm, Patient Position: Sitting, Cuff Size: Normal)   Pulse 87   Temp 98.3 F (36.8 C) (Oral)   Wt 207 lb 6.4 oz (94.1 kg)   LMP 01/30/1994   SpO2 95%   BMI 35.60 kg/m  Wt Readings from Last 3 Encounters:  03/06/16 207 lb 6.4 oz (94.1 kg)  02/28/16 210 lb (95.3 kg)  12/28/15 215 lb 6.4 oz (97.7 kg)     Lab Results  Component Value Date   WBC 6.9 02/28/2016   HGB 11.1 (L) 02/28/2016   HCT 34.7 (L) 02/28/2016   PLT 204 02/28/2016   GLUCOSE 96 02/28/2016   CHOL 176 11/04/2015   TRIG 264.0 (H) 11/04/2015   HDL 45.70 11/04/2015   LDLDIRECT 98.0 11/04/2015   LDLCALC 120 (H) 10/10/2013   ALT 25 11/04/2015   AST 26 11/04/2015   NA 142 02/28/2016   K 4.6 02/28/2016   CL 109 02/28/2016   CREATININE 0.91 02/28/2016   BUN 13 02/28/2016   CO2 28 02/28/2016   TSH 2.06 11/04/2015   INR 1.02 12/09/2013   HGBA1C 5.9 11/04/2015    Lab Results  Component Value Date   TSH 2.06 11/04/2015   Lab Results  Component Value Date   WBC 6.9 02/28/2016   HGB 11.1 (L) 02/28/2016   HCT 34.7 (L) 02/28/2016   MCV 91.3 02/28/2016   PLT 204 02/28/2016   Lab Results  Component Value Date   NA 142 02/28/2016   K 4.6 02/28/2016   CHLORIDE 107 11/24/2014   CO2 28 02/28/2016   GLUCOSE 96 02/28/2016   BUN 13 02/28/2016   CREATININE 0.91 02/28/2016   BILITOT 0.4 11/04/2015   ALKPHOS 102 11/04/2015   AST 26 11/04/2015   ALT 25 11/04/2015   PROT 7.0 11/04/2015   ALBUMIN 4.0 11/04/2015   CALCIUM 8.8 (L) 02/28/2016   ANIONGAP 5 02/28/2016   EGFR 70 (L) 11/24/2014   GFR 69.99 11/04/2015   Lab Results  Component Value Date   CHOL 176 11/04/2015   Lab Results  Component Value Date   HDL 45.70 11/04/2015    Lab Results  Component Value Date   LDLCALC 120 (H) 10/10/2013   Lab Results  Component Value Date   TRIG 264.0 (H) 11/04/2015   Lab Results  Component Value Date   CHOLHDL 4 11/04/2015   Lab Results  Component Value Date   HGBA1C 5.9 11/04/2015       Assessment & Plan:   Problem List Items Addressed This Visit    Essential hypertension    Well controlled, no changes to meds. Encouraged heart healthy diet such as the DASH diet and exercise as tolerated.       UTI (urinary tract infection)    Recent UTI treated with antibiotics. Will recheck urine sample today. AZO cranberry, probiotics daily, hydrate      Relevant Medications   sulfamethoxazole-trimethoprim (BACTRIM DS,SEPTRA DS) 800-160 MG tablet   Other Relevant Orders   Urinalysis   Urine culture   Recurrent falls    [place] last week  and she went to ER with syncope and low blood pressure. She was ultimately diagnosed with  UTI she has improved since then to a great degree after being treated with antibiotics.       Syncope    Syncopal episode last week, extensive work up negative for anything except uti      Acute bilateral low back pain without sciatica - Primary    Worse this week possibly related to recent fall, but she reports it feels more like pain she has had in past related to UTI, will check xray and urine      Relevant Orders   DG Lumbar Spine Complete      I am having Ms. Menzer start on sulfamethoxazole-trimethoprim. I am also having her maintain her hydrOXYzine, pentosan polysulfate, Cholecalciferol, B Complex-C (SUPER B COMPLEX PO), RA KRILL OIL, acetaminophen, Venlafaxine HCl, rosuvastatin, gabapentin, clonazePAM, folic acid, aspirin EC, ferrous sulfate, multivitamin with minerals, OVER THE COUNTER MEDICATION, omeprazole, carvedilol, telmisartan, ciprofloxacin, and ranitidine.  Meds ordered this encounter  Medications  . sulfamethoxazole-trimethoprim (BACTRIM DS,SEPTRA DS) 800-160 MG tablet     Sig: Take 1 tablet by mouth 2 (two) times daily.    Dispense:  10 tablet    Refill:  0    CMA served as scribe during this visit. History, Physical and Plan performed by medical provider. Documentation and orders reviewed and attested to.  Penni Homans, MD

## 2016-03-06 NOTE — Assessment & Plan Note (Signed)
Well controlled, no changes to meds. Encouraged heart healthy diet such as the DASH diet and exercise as tolerated.  °

## 2016-03-06 NOTE — Assessment & Plan Note (Signed)
[place] last week and she went to ER with syncope and low blood pressure. She was ultimately diagnosed with  UTI she has improved since then to a great degree after being treated with antibiotics.

## 2016-03-06 NOTE — Patient Instructions (Addendum)
NOW probiotics daily  Encouraged increased hydration and fiber in diet. Daily probiotics. If bowels not moving can use MOM 2 tbls po in 4 oz of warm prune juice by mouth every 2-3 days. If no results then repeat in 4 hours with  Dulcolax suppository pr, may repeat again in 4 more hours as needed. Seek care if symptoms worsen. Consider daily Miralax and/or Dulcolax if symptoms persist.    Urinary Tract Infection, Adult Introduction A urinary tract infection (UTI) is an infection of any part of the urinary tract. The urinary tract includes the:  Kidneys.  Ureters.  Bladder.  Urethra. These organs make, store, and get rid of pee (urine) in the body. Follow these instructions at home:  Take over-the-counter and prescription medicines only as told by your doctor.  If you were prescribed an antibiotic medicine, take it as told by your doctor. Do not stop taking the antibiotic even if you start to feel better.  Avoid the following drinks:  Alcohol.  Caffeine.  Tea.  Carbonated drinks.  Drink enough fluid to keep your pee clear or pale yellow.  Keep all follow-up visits as told by your doctor. This is important.  Make sure to:  Empty your bladder often and completely. Do not to hold pee for long periods of time.  Empty your bladder before and after sex.  Wipe from front to back after a bowel movement if you are female. Use each tissue one time when you wipe. Contact a doctor if:  You have back pain.  You have a fever.  You feel sick to your stomach (nauseous).  You throw up (vomit).  Your symptoms do not get better after 3 days.  Your symptoms go away and then come back. Get help right away if:  You have very bad back pain.  You have very bad lower belly (abdominal) pain.  You are throwing up and cannot keep down any medicines or water. This information is not intended to replace advice given to you by your health care provider. Make sure you discuss any  questions you have with your health care provider. Document Released: 07/05/2007 Document Revised: 06/24/2015 Document Reviewed: 12/07/2014  2017 Elsevier

## 2016-03-07 ENCOUNTER — Other Ambulatory Visit: Payer: Self-pay | Admitting: Family Medicine

## 2016-03-07 DIAGNOSIS — M8008XA Age-related osteoporosis with current pathological fracture, vertebra(e), initial encounter for fracture: Secondary | ICD-10-CM

## 2016-03-07 DIAGNOSIS — M545 Low back pain: Secondary | ICD-10-CM

## 2016-03-07 LAB — URINE CULTURE: Organism ID, Bacteria: NO GROWTH

## 2016-03-10 ENCOUNTER — Telehealth: Payer: Self-pay | Admitting: Family Medicine

## 2016-03-10 MED ORDER — SULFAMETHOXAZOLE-TRIMETHOPRIM 800-160 MG PO TABS: 1.0000 | ORAL_TABLET | Freq: Two times a day (BID) | ORAL | 0 refills | Status: DC

## 2016-03-10 NOTE — Telephone Encounter (Signed)
Caller name: Relationship to patient: Self Can be reached: [phone]  Pharmacy:  [id number] - [place], [place] [phone] (Phone) [phone] (Fax)    Reason for call: Patient states that she has not received the medication that was prescribed for her (Bactrim). States it was sent to [facility] mail order but she is in severe pain and needs it sent to a local pharmacy. States she does not use mail order.

## 2016-03-10 NOTE — Telephone Encounter (Signed)
Spoke with patient she has agreed to take the bactrim. No further questions or concerns.  PC

## 2016-03-11 ENCOUNTER — Ambulatory Visit (HOSPITAL_BASED_OUTPATIENT_CLINIC_OR_DEPARTMENT_OTHER)
Admission: RE | Admit: 2016-03-11 | Discharge: 2016-03-11 | Disposition: A | Discharge status: Home / Self Care | Payer: Medicare Other | Source: Ambulatory Visit | Attending: Family Medicine | Admitting: Family Medicine

## 2016-03-11 DIAGNOSIS — M8008XA Age-related osteoporosis with current pathological fracture, vertebra(e), initial encounter for fracture: Secondary | ICD-10-CM

## 2016-03-11 DIAGNOSIS — M5126 Other intervertebral disc displacement, lumbar region: Secondary | ICD-10-CM | POA: Diagnosis not present

## 2016-03-11 DIAGNOSIS — M8088XA Other osteoporosis with current pathological fracture, vertebra(e), initial encounter for fracture: Secondary | ICD-10-CM | POA: Diagnosis not present

## 2016-03-11 DIAGNOSIS — M545 Low back pain: Secondary | ICD-10-CM | POA: Diagnosis not present

## 2016-03-11 DIAGNOSIS — M47896 Other spondylosis, lumbar region: Secondary | ICD-10-CM | POA: Insufficient documentation

## 2016-03-12 ENCOUNTER — Other Ambulatory Visit: Payer: Self-pay | Admitting: Family Medicine

## 2016-03-12 DIAGNOSIS — M8008XK Age-related osteoporosis with current pathological fracture, vertebra(e), subsequent encounter for fracture with nonunion: Secondary | ICD-10-CM

## 2016-03-12 DIAGNOSIS — M545 Low back pain: Secondary | ICD-10-CM

## 2016-03-13 ENCOUNTER — Encounter: Payer: Self-pay | Admitting: Cardiology

## 2016-03-13 ENCOUNTER — Other Ambulatory Visit: Payer: Self-pay | Admitting: Family Medicine

## 2016-03-13 ENCOUNTER — Telehealth: Payer: Self-pay | Admitting: Family Medicine

## 2016-03-13 ENCOUNTER — Ambulatory Visit (INDEPENDENT_AMBULATORY_CARE_PROVIDER_SITE_OTHER): Payer: Medicare Other | Admitting: Cardiology

## 2016-03-13 VITALS — BP 129/68 | HR 77 | Ht 64.0 in | Wt 211.0 lb

## 2016-03-13 DIAGNOSIS — I10 Essential (primary) hypertension: Secondary | ICD-10-CM

## 2016-03-13 DIAGNOSIS — I420 Dilated cardiomyopathy: Secondary | ICD-10-CM | POA: Diagnosis not present

## 2016-03-13 DIAGNOSIS — M8008XS Age-related osteoporosis with current pathological fracture, vertebra(e), sequela: Secondary | ICD-10-CM

## 2016-03-13 DIAGNOSIS — M549 Dorsalgia, unspecified: Secondary | ICD-10-CM

## 2016-03-13 DIAGNOSIS — R55 Syncope and collapse: Secondary | ICD-10-CM

## 2016-03-13 DIAGNOSIS — E7800 Pure hypercholesterolemia, unspecified: Secondary | ICD-10-CM

## 2016-03-13 NOTE — Telephone Encounter (Signed)
She now has 2 fractures one is the old one and it is stable and unchanged. Her new fracture it is at T 12 and it is a 30% collapse and it must be pressing on a nerve causing pain. That is why she needs the Kyphoplasty

## 2016-03-13 NOTE — Patient Instructions (Signed)
Your physician wants you to follow-up in: 6 MONTHS WITH DR CRENSHAW You will receive a reminder letter in the mail two months in advance. If you don't receive a letter, please call our office to schedule the follow-up appointment.   If you need a refill on your cardiac medications before your next appointment, please call your pharmacy.  

## 2016-03-13 NOTE — Telephone Encounter (Signed)
The patient would like explained to her the recent fracture and compare to previous fracture.  She is ok to have surgery, but with the past fracture PT/water aerobics took care of?  She just wants to know what the difference is.

## 2016-03-13 NOTE — Telephone Encounter (Signed)
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

## 2016-03-13 NOTE — Telephone Encounter (Signed)
She does not need the Bactrim at this time. This is a good antiboitic for skin infections, urine infections and respiratory infections so hold onto it and we can potentially use it in the future. Call with any concerns

## 2016-03-13 NOTE — Telephone Encounter (Signed)
This patient received from [facility] and antibiotic on Saturday for Sulfamethoxazote Batrim DS [phone].  She was told per lab results her culture was ok.   She is completely confused if she is to start this or not--also it was sent to [facility] which is too late now as she has now but should have been sent to the local pharmacy. Advise if you do want her to take this prescription or not.

## 2016-03-13 NOTE — Telephone Encounter (Signed)
Caller Name: Buddy Duty  Relation to [id number]  Call back number: [phone] work #  Reason for call:  Patient would like to be referred to dr. Rolena Infante orthopedic [place]

## 2016-03-13 NOTE — Telephone Encounter (Signed)
See results notes daughter informed of results.

## 2016-03-14 DIAGNOSIS — M4846XA Fatigue fracture of vertebra, lumbar region, initial encounter for fracture: Secondary | ICD-10-CM | POA: Diagnosis not present

## 2016-03-14 NOTE — Telephone Encounter (Signed)
Patient informed of information regarding fractures.

## 2016-03-14 NOTE — Telephone Encounter (Signed)
Patient informed of PCP instructions regarding antibiotics. 

## 2016-03-15 NOTE — Telephone Encounter (Signed)
Follow up call  made to patient states she was seen by Dr. Rolena Infante on yesterday.

## 2016-03-15 NOTE — Telephone Encounter (Signed)
Patient would like to speak with you, please advise

## 2016-03-16 ENCOUNTER — Other Ambulatory Visit: Payer: Self-pay | Admitting: Family Medicine

## 2016-03-16 ENCOUNTER — Telehealth: Payer: Self-pay | Admitting: Family Medicine

## 2016-03-16 DIAGNOSIS — M545 Low back pain: Secondary | ICD-10-CM

## 2016-03-16 NOTE — Telephone Encounter (Signed)
Put in [facility] referral

## 2016-03-16 NOTE — Telephone Encounter (Signed)
Called left message to call back 

## 2016-03-16 NOTE — Telephone Encounter (Signed)
Caller name: Relationship to patient:Self Can be reached: [phone] or [phone] Pharmacy:  Reason for call: Patient request a call back to discuss possible surgery. Request referral for second opinion.

## 2016-03-16 NOTE — Telephone Encounter (Signed)
Patient was seen by Dr. Rolena Infante and was not pleased with his response.  He has given her a prescription for the Calcinonin Salmon nose spray and told to return in one month. He did agree that fracture was different from previous one and could eventally benefit from surgery.   She would like another referral to either Dr. Estanislado Pandy  Or someone at [facility] please thanks

## 2016-03-17 ENCOUNTER — Other Ambulatory Visit: Payer: Self-pay | Admitting: Family Medicine

## 2016-03-17 DIAGNOSIS — M8008XA Age-related osteoporosis with current pathological fracture, vertebra(e), initial encounter for fracture: Secondary | ICD-10-CM

## 2016-03-17 NOTE — Telephone Encounter (Signed)
Referral entered  

## 2016-03-27 ENCOUNTER — Other Ambulatory Visit: Payer: Self-pay | Admitting: Orthopedic Surgery

## 2016-03-27 DIAGNOSIS — M545 Low back pain: Secondary | ICD-10-CM | POA: Diagnosis not present

## 2016-03-28 ENCOUNTER — Telehealth: Payer: Self-pay | Admitting: Family Medicine

## 2016-03-28 NOTE — Telephone Encounter (Addendum)
°  Relation to [id number] Call back [phone]   Reason for call:  Patient would like to discuss Orthopedic referral, patient states 2 referrals were placed.

## 2016-03-28 NOTE — Telephone Encounter (Signed)
Spoke to the and she was just confused as another appt with an ortho was made for her.  She was seen already by another MD at [facility] orth[facility] and is having surgery on Thursday.

## 2016-03-29 ENCOUNTER — Encounter (HOSPITAL_COMMUNITY): Payer: Self-pay | Admitting: *Deleted

## 2016-03-29 NOTE — Progress Notes (Signed)
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 Stanford Breed. Pt denies chest pain or sob. Pt states she is not diabetic.

## 2016-03-30 ENCOUNTER — Encounter (HOSPITAL_COMMUNITY)
Admission: RE | Disposition: A | Discharge status: Home / Self Care | Payer: Self-pay | Source: Ambulatory Visit | Attending: Orthopedic Surgery

## 2016-03-30 ENCOUNTER — Ambulatory Visit (HOSPITAL_COMMUNITY): Payer: Medicare Other | Admitting: Certified Registered Nurse Anesthetist

## 2016-03-30 ENCOUNTER — Ambulatory Visit (HOSPITAL_COMMUNITY)
Admission: RE | Admit: 2016-03-30 | Discharge: 2016-03-30 | Disposition: A | Discharge status: Home / Self Care | Payer: Medicare Other | Source: Ambulatory Visit | Attending: Orthopedic Surgery | Admitting: Orthopedic Surgery

## 2016-03-30 ENCOUNTER — Encounter (HOSPITAL_COMMUNITY): Payer: Self-pay | Admitting: *Deleted

## 2016-03-30 ENCOUNTER — Ambulatory Visit (HOSPITAL_COMMUNITY): Payer: Medicare Other

## 2016-03-30 DIAGNOSIS — Z01818 Encounter for other preprocedural examination: Secondary | ICD-10-CM

## 2016-03-30 DIAGNOSIS — K219 Gastro-esophageal reflux disease without esophagitis: Secondary | ICD-10-CM | POA: Insufficient documentation

## 2016-03-30 DIAGNOSIS — I429 Cardiomyopathy, unspecified: Secondary | ICD-10-CM | POA: Insufficient documentation

## 2016-03-30 DIAGNOSIS — Z853 Personal history of malignant neoplasm of breast: Secondary | ICD-10-CM | POA: Insufficient documentation

## 2016-03-30 DIAGNOSIS — Z6836 Body mass index (BMI) 36.0-36.9, adult: Secondary | ICD-10-CM | POA: Insufficient documentation

## 2016-03-30 DIAGNOSIS — I10 Essential (primary) hypertension: Secondary | ICD-10-CM | POA: Diagnosis not present

## 2016-03-30 DIAGNOSIS — F329 Major depressive disorder, single episode, unspecified: Secondary | ICD-10-CM | POA: Insufficient documentation

## 2016-03-30 DIAGNOSIS — M4854XA Collapsed vertebra, not elsewhere classified, thoracic region, initial encounter for fracture: Secondary | ICD-10-CM | POA: Diagnosis not present

## 2016-03-30 DIAGNOSIS — Z7982 Long term (current) use of aspirin: Secondary | ICD-10-CM | POA: Diagnosis not present

## 2016-03-30 DIAGNOSIS — E782 Mixed hyperlipidemia: Secondary | ICD-10-CM | POA: Diagnosis not present

## 2016-03-30 DIAGNOSIS — Z923 Personal history of irradiation: Secondary | ICD-10-CM | POA: Diagnosis not present

## 2016-03-30 DIAGNOSIS — Z9221 Personal history of antineoplastic chemotherapy: Secondary | ICD-10-CM | POA: Diagnosis not present

## 2016-03-30 DIAGNOSIS — R7303 Prediabetes: Secondary | ICD-10-CM | POA: Insufficient documentation

## 2016-03-30 DIAGNOSIS — Z85820 Personal history of malignant melanoma of skin: Secondary | ICD-10-CM | POA: Diagnosis not present

## 2016-03-30 DIAGNOSIS — G629 Polyneuropathy, unspecified: Secondary | ICD-10-CM | POA: Insufficient documentation

## 2016-03-30 DIAGNOSIS — E785 Hyperlipidemia, unspecified: Secondary | ICD-10-CM | POA: Insufficient documentation

## 2016-03-30 DIAGNOSIS — S22080A Wedge compression fracture of T11-T12 vertebra, initial encounter for closed fracture: Secondary | ICD-10-CM | POA: Diagnosis not present

## 2016-03-30 DIAGNOSIS — R296 Repeated falls: Secondary | ICD-10-CM | POA: Insufficient documentation

## 2016-03-30 DIAGNOSIS — M1990 Unspecified osteoarthritis, unspecified site: Secondary | ICD-10-CM | POA: Insufficient documentation

## 2016-03-30 DIAGNOSIS — Z462 Encounter for fitting and adjustment of other devices related to nervous system and special senses: Secondary | ICD-10-CM | POA: Diagnosis not present

## 2016-03-30 DIAGNOSIS — Z419 Encounter for procedure for purposes other than remedying health state, unspecified: Secondary | ICD-10-CM

## 2016-03-30 DIAGNOSIS — F418 Other specified anxiety disorders: Secondary | ICD-10-CM | POA: Diagnosis not present

## 2016-03-30 DIAGNOSIS — M545 Low back pain, unspecified: Secondary | ICD-10-CM

## 2016-03-30 HISTORY — DX: Personal history of other diseases of the digestive system: Z87.19

## 2016-03-30 HISTORY — PX: KYPHOPLASTY: SHX5884

## 2016-03-30 LAB — CBC WITH DIFFERENTIAL/PLATELET
BASOS PCT: 1 %
Basophils Absolute: 0 10*3/uL (ref 0.0–0.1)
Eosinophils Absolute: 0.4 10*3/uL (ref 0.0–0.7)
Eosinophils Relative: 7 %
HEMATOCRIT: 37 % (ref 36.0–46.0)
Hemoglobin: 11.5 g/dL — ABNORMAL LOW (ref 12.0–15.0)
Lymphocytes Relative: 27 %
Lymphs Abs: 1.6 10*3/uL (ref 0.7–4.0)
MCH: 28.5 pg (ref 26.0–34.0)
MCHC: 31.1 g/dL (ref 30.0–36.0)
MCV: 91.8 fL (ref 78.0–100.0)
Monocytes Absolute: 0.4 10*3/uL (ref 0.1–1.0)
Monocytes Relative: 7 %
NEUTROS ABS: 3.4 10*3/uL (ref 1.7–7.7)
NEUTROS PCT: 58 %
Platelets: 240 10*3/uL (ref 150–400)
RBC: 4.03 MIL/uL (ref 3.87–5.11)
RDW: 14.3 % (ref 11.5–15.5)
WBC: 5.9 10*3/uL (ref 4.0–10.5)

## 2016-03-30 LAB — PROTIME-INR
INR: 1.06
Prothrombin Time: 13.9 seconds (ref 11.4–15.2)

## 2016-03-30 LAB — URINALYSIS, ROUTINE W REFLEX MICROSCOPIC
Bilirubin Urine: NEGATIVE
GLUCOSE, UA: NEGATIVE mg/dL
HGB URINE DIPSTICK: NEGATIVE
Ketones, ur: NEGATIVE mg/dL
Leukocytes, UA: NEGATIVE
Nitrite: NEGATIVE
PH: 7 (ref 5.0–8.0)
Protein, ur: NEGATIVE mg/dL
SPECIFIC GRAVITY, URINE: 1.006 (ref 1.005–1.030)

## 2016-03-30 LAB — COMPREHENSIVE METABOLIC PANEL
ALBUMIN: 3.8 g/dL (ref 3.5–5.0)
ALK PHOS: 119 U/L (ref 38–126)
ALT: 24 U/L (ref 14–54)
AST: 27 U/L (ref 15–41)
Anion gap: 9 (ref 5–15)
BILIRUBIN TOTAL: 0.6 mg/dL (ref 0.3–1.2)
BUN: 11 mg/dL (ref 6–20)
CALCIUM: 9.6 mg/dL (ref 8.9–10.3)
CO2: 25 mmol/L (ref 22–32)
CREATININE: 0.98 mg/dL (ref 0.44–1.00)
Chloride: 108 mmol/L (ref 101–111)
GFR calc Af Amer: >60 mL/min (ref >60)
GFR calc non Af Amer: 57 mL/min — ABNORMAL LOW (ref >60)
GLUCOSE: 131 mg/dL — AB (ref 65–99)
Potassium: 4.3 mmol/L (ref 3.5–5.1)
Sodium: 142 mmol/L (ref 135–145)
Total Protein: 6.6 g/dL (ref 6.5–8.1)

## 2016-03-30 LAB — APTT: aPTT: 28 seconds (ref 24–36)

## 2016-03-30 SURGERY — KYPHOPLASTY
Anesthesia: General | Site: Back

## 2016-03-30 MED ORDER — LACTATED RINGERS IV SOLN
INTRAVENOUS | Status: DC
  Administered 2016-03-30: 08:00:00 via INTRAVENOUS

## 2016-03-30 MED ORDER — FENTANYL CITRATE (PF) 100 MCG/2ML IJ SOLN
INTRAMUSCULAR | Status: AC
  Filled 2016-03-30: qty 4

## 2016-03-30 MED ORDER — PHENYLEPHRINE HCL 10 MG/ML IJ SOLN
INTRAMUSCULAR | Status: DC | PRN
  Administered 2016-03-30 (×2): 80 ug via INTRAVENOUS

## 2016-03-30 MED ORDER — PROPOFOL 10 MG/ML IV BOLUS
INTRAVENOUS | Status: DC | PRN
  Administered 2016-03-30: 140 mg via INTRAVENOUS

## 2016-03-30 MED ORDER — EPINEPHRINE PF 1 MG/ML IJ SOLN
INTRAMUSCULAR | Status: AC
  Filled 2016-03-30: qty 1

## 2016-03-30 MED ORDER — MEPERIDINE HCL 25 MG/ML IJ SOLN: 6.2500 mg | INTRAMUSCULAR | Status: DC | PRN

## 2016-03-30 MED ORDER — EPHEDRINE SULFATE 50 MG/ML IJ SOLN
INTRAMUSCULAR | Status: DC | PRN
  Administered 2016-03-30 (×2): 10 mg via INTRAVENOUS

## 2016-03-30 MED ORDER — OXYCODONE HCL 5 MG PO TABS: 5.0000 mg | ORAL_TABLET | Freq: Once | ORAL | Status: DC | PRN

## 2016-03-30 MED ORDER — LACTATED RINGERS IV SOLN
INTRAVENOUS | Status: DC | PRN
  Administered 2016-03-30: 11:00:00 via INTRAVENOUS

## 2016-03-30 MED ORDER — LIDOCAINE HCL (CARDIAC) 20 MG/ML IV SOLN
INTRAVENOUS | Status: DC | PRN
  Administered 2016-03-30: 100 mg via INTRAVENOUS

## 2016-03-30 MED ORDER — FENTANYL CITRATE (PF) 100 MCG/2ML IJ SOLN
25.0000 ug | INTRAMUSCULAR | Status: DC | PRN
  Administered 2016-03-30 (×2): 50 ug via INTRAVENOUS

## 2016-03-30 MED ORDER — ARTIFICIAL TEARS OP OINT
TOPICAL_OINTMENT | OPHTHALMIC | Status: DC | PRN
  Administered 2016-03-30: 1 via OPHTHALMIC

## 2016-03-30 MED ORDER — FENTANYL CITRATE (PF) 100 MCG/2ML IJ SOLN
INTRAMUSCULAR | Status: DC | PRN
  Administered 2016-03-30: 100 ug via INTRAVENOUS

## 2016-03-30 MED ORDER — OXYCODONE HCL 5 MG/5ML PO SOLN: 5.0000 mg | Freq: Once | ORAL | Status: DC | PRN

## 2016-03-30 MED ORDER — BUPIVACAINE HCL (PF) 0.25 % IJ SOLN
INTRAMUSCULAR | Status: AC
  Filled 2016-03-30: qty 30

## 2016-03-30 MED ORDER — CEFAZOLIN SODIUM-DEXTROSE 2-4 GM/100ML-% IV SOLN
2.0000 g | INTRAVENOUS | Status: AC
  Administered 2016-03-30: 2 g via INTRAVENOUS
  Filled 2016-03-30: qty 100

## 2016-03-30 MED ORDER — PROMETHAZINE HCL 25 MG/ML IJ SOLN: 6.2500 mg | INTRAMUSCULAR | Status: DC | PRN

## 2016-03-30 MED ORDER — IOPAMIDOL (ISOVUE-300) INJECTION 61%
INTRAVENOUS | Status: AC
  Filled 2016-03-30: qty 100

## 2016-03-30 MED ORDER — IOPAMIDOL (ISOVUE-300) INJECTION 61%
INTRAVENOUS | Status: DC | PRN
  Administered 2016-03-30: 50 mL

## 2016-03-30 MED ORDER — SUGAMMADEX SODIUM 200 MG/2ML IV SOLN
INTRAVENOUS | Status: DC | PRN
  Administered 2016-03-30: 200 mg via INTRAVENOUS

## 2016-03-30 MED ORDER — SODIUM CHLORIDE 0.9 % IV SOLN
INTRAVENOUS | Status: DC | PRN
  Administered 2016-03-30: 25 ug/min via INTRAVENOUS

## 2016-03-30 MED ORDER — ONDANSETRON HCL 4 MG/2ML IJ SOLN
INTRAMUSCULAR | Status: DC | PRN
  Administered 2016-03-30: 4 mg via INTRAVENOUS

## 2016-03-30 MED ORDER — ROCURONIUM BROMIDE 100 MG/10ML IV SOLN
INTRAVENOUS | Status: DC | PRN
  Administered 2016-03-30: 5 mg via INTRAVENOUS
  Administered 2016-03-30: 10 mg via INTRAVENOUS
  Administered 2016-03-30: 35 mg via INTRAVENOUS

## 2016-03-30 MED ORDER — DOUBLE ANTIBIOTIC 500-10000 UNIT/GM EX OINT
TOPICAL_OINTMENT | CUTANEOUS | Status: AC
  Filled 2016-03-30: qty 1

## 2016-03-30 MED ORDER — POVIDONE-IODINE 7.5 % EX SOLN
Freq: Once | CUTANEOUS | Status: DC
  Filled 2016-03-30: qty 118

## 2016-03-30 MED ORDER — FENTANYL CITRATE (PF) 100 MCG/2ML IJ SOLN
INTRAMUSCULAR | Status: AC
  Filled 2016-03-30: qty 2

## 2016-03-30 SURGICAL SUPPLY — 43 items
BLADE SURG 15 STRL LF DISP TIS (BLADE) ×1 IMPLANT
BLADE SURG 15 STRL SS (BLADE) ×2
CEMENT KYPHON C01A KIT/MIXER (Cement) ×1 IMPLANT
COVER MAYO STAND STRL (DRAPES) ×2 IMPLANT
COVER SURGICAL LIGHT HANDLE (MISCELLANEOUS) ×2 IMPLANT
CURETTE EXPRESS SZ2 7MM (INSTRUMENTS) IMPLANT
CURRETTE EXPRESS SZ2 7MM (INSTRUMENTS) ×2
DRAPE C-ARM 42X72 X-RAY (DRAPES) ×2 IMPLANT
DRAPE INCISE IOBAN 66X45 STRL (DRAPES) ×2 IMPLANT
DRAPE LAPAROTOMY T 102X78X121 (DRAPES) ×2 IMPLANT
DRAPE PROXIMA HALF (DRAPES) IMPLANT
DRAPE SURG 17X23 STRL (DRAPES) ×8 IMPLANT
DURAPREP 26ML APPLICATOR (WOUND CARE) ×2 IMPLANT
GAUZE SPONGE 2X2 8PLY NS (GAUZE/BANDAGES/DRESSINGS) ×1 IMPLANT
GAUZE SPONGE 4X4 16PLY XRAY LF (GAUZE/BANDAGES/DRESSINGS) ×2 IMPLANT
GLOVE BIO SURGEON STRL SZ7 (GLOVE) ×2 IMPLANT
GLOVE BIO SURGEON STRL SZ8 (GLOVE) ×2 IMPLANT
GLOVE BIOGEL PI IND STRL 7.0 (GLOVE) ×1 IMPLANT
GLOVE BIOGEL PI IND STRL 8 (GLOVE) ×1 IMPLANT
GLOVE BIOGEL PI INDICATOR 7.0 (GLOVE) ×1
GLOVE BIOGEL PI INDICATOR 8 (GLOVE) ×1
GOWN STRL REUS W/ TWL LRG LVL3 (GOWN DISPOSABLE) ×2 IMPLANT
GOWN STRL REUS W/ TWL XL LVL3 (GOWN DISPOSABLE) ×1 IMPLANT
GOWN STRL REUS W/TWL LRG LVL3 (GOWN DISPOSABLE) ×4
GOWN STRL REUS W/TWL XL LVL3 (GOWN DISPOSABLE) ×2
KIT BASIN OR (CUSTOM PROCEDURE TRAY) ×2 IMPLANT
KIT ROOM TURNOVER OR (KITS) ×2 IMPLANT
NDL HYPO 25X1 1.5 SAFETY (NEEDLE) IMPLANT
NDL SPNL 18GX3.5 QUINCKE PK (NEEDLE) ×2 IMPLANT
NEEDLE 22X1 1/2 (OR ONLY) (NEEDLE) IMPLANT
NEEDLE HYPO 25X1 1.5 SAFETY (NEEDLE) IMPLANT
NEEDLE SPNL 18GX3.5 QUINCKE PK (NEEDLE) ×4 IMPLANT
NS IRRIG 1000ML POUR BTL (IV SOLUTION) ×2 IMPLANT
PACK UNIVERSAL I (CUSTOM PROCEDURE TRAY) ×2 IMPLANT
PAD ARMBOARD 7.5X6 YLW CONV (MISCELLANEOUS) ×4 IMPLANT
POSITIONER HEAD PRONE TRACH (MISCELLANEOUS) ×2 IMPLANT
SUT MNCRL AB 4-0 PS2 18 (SUTURE) ×2 IMPLANT
SYR BULB IRRIGATION 50ML (SYRINGE) ×2 IMPLANT
SYR CONTROL 10ML LL (SYRINGE) ×2 IMPLANT
TAPE CLOTH SURG 4X10 WHT LF (GAUZE/BANDAGES/DRESSINGS) ×1 IMPLANT
TOWEL OR 17X24 6PK STRL BLUE (TOWEL DISPOSABLE) ×2 IMPLANT
TOWEL OR 17X26 10 PK STRL BLUE (TOWEL DISPOSABLE) ×2 IMPLANT
TRAY KYPHOPAK 15/3 ONESTEP 1ST (MISCELLANEOUS) IMPLANT

## 2016-03-30 NOTE — H&P (Signed)
PREOPERATIVE H&P  Chief Complaint: Mid back pain  HPI: Shannon [name] is a 71 y.o. female who presents with ongoing pain in the mid back x 3 weeks  MRI reveals edema in the T12 vertebral body c/w a compression fracture  Patient has failed multiple forms of conservative care and continues to have pain (see office notes for additional details regarding the patient's full course of treatment)  Past Medical History:  Diagnosis Date  . 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 Anemia 08/28/2011  . Asthma    as a child  . Brain tumor ([place])   . Breast cancer ([place])    right breast  . Carpal tunnel syndrome of left wrist   . Cataracts, bilateral 12/28/2015  . Constipation 03/03/2012  . DEPRESSION 08/11/2008  . Dermatitis   . Facial fracture ([place])   . GERD 08/11/2008  . H/O measles   . H/O mumps   . History of blood transfusion   . History of chicken pox   . History of hiatal hernia   . 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 HYPERLIPIDEMIA 08/11/2008  . HYPERTENSION 08/11/2008  . Hyponatremia 08/28/2011  . 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 Interstitial cystitis   . LIPOMA 02/10/2009  . Medicare annual wellness visit, subsequent 12/27/2014   Follows with Dr Amalia Hailey of urology Follows with Dr Posey Pronto at [facility] for opthamology Follows with Dr [name] of dermatology Follows with [facility] gastroenterology, last scope done in 2011 repeat in 10 years Last [facility] July 2015 should repeat every 1-2 years Last Pap December 2015, repeat in 3 years.   . Melanoma ([place]) 2008   DR. Ronnald Ramp  . Neuropathy ([place])   . NICM (nonischemic cardiomyopathy) ([place]) 03/08/2010   likely 2/2 chemotx - a. Echo 2012: EF 45-50%;  b. Lex MV 2/12:  low risk, apical defect (small area of ischemia vs shifting breast atten);  c.  Echo 7/12: Normal wall thickness, EF 60-65%, normal wall motion, grade 1 diastolic dysfunction, mild LAE, PASP 32;   d. Lex MV  11/13:  EF 76%, no ischemia  . OA (osteoarthritis) 09/07/2011  . Obesity 09/28/2014  . Osteoporosis   . PREDIABETES 06/15/2009  . Recurrent falls 12/28/2015  . Shortness of breath   . UTI (lower urinary tract infection) 03/03/2012   Past Surgical History:  Procedure Laterality Date  . BREAST LUMPECTOMY  04/2009   RIGHT FOR BREAST CANCER-CHEMO/RADIATION X 1 YEAR  . BREAST REDUCTION SURGERY Bilateral 05/04/2014   Procedure: MAMMARY REDUCTION  (BREAST);  Surgeon: Cristine Polio, MD;  Location: [facility];  Service: Plastics;  Laterality: Bilateral;  . CARPAL TUNNEL RELEASE     L wrist, ulnar nerve moved  . CHOLECYSTECTOMY    . CRANIOTOMY FOR TUMOR  2000  . ELBOW SURGERY     left  . KNEE ARTHROSCOPY Left 10/12/2014   Procedure: LEFT KNEE ARTHROSCOPY ;  Surgeon: Gaynelle Arabian, MD;  Location: [facility];  Service: Orthopedics;  Laterality: Left;  . LEFT HEART CATHETERIZATION WITH CORONARY ANGIOGRAM N/A 12/16/2013   Procedure: LEFT HEART CATHETERIZATION WITH CORONARY ANGIOGRAM;  Surgeon: Peter M [name], MD;  Location: [facility] CATH LAB;  Service: Cardiovascular;  Laterality: N/A;  . LIPOMA EXCISION  03/28/2009   right leg  . MELANOMA EXCISION    . PORT-A-CATH REMOVAL  11/30/2010   Streck  . porta  cath    . [facility] PLACEMENT  may 2011  . SYNOVECTOMY Left 10/12/2014   Procedure: WITH SYNOVECTOMY;  Surgeon: Gaynelle Arabian, MD;  Location: [facility];  Service: Orthopedics;  Laterality: Left;  . TONSILLECTOMY  1958  . TOTAL KNEE ARTHROPLASTY  02/05/2012   Procedure: TOTAL KNEE ARTHROPLASTY;  Surgeon: Gearlean Alf, MD;  Location: [facility];  Service: Orthopedics;  Laterality: Right;  . TOTAL KNEE ARTHROPLASTY Left 02/10/2013   Procedure: LEFT TOTAL KNEE ARTHROPLASTY;  Surgeon: Gearlean Alf, MD;  Location: [facility];  Service: Orthopedics;  Laterality: Left;  . total knee raplacement  01-2012   Right Knee  . TUBAL LIGATION  1997   Social History   Social History  . Marital status: Married     Spouse name: N/A  . Number of children: 3  . Years of education: N/A   Occupational History  . boutique owner [employer]   Social History Main Topics  . Smoking status: Never Smoker  . Smokeless tobacco: Never Used  . Alcohol use No  . Drug use: No  . Sexual activity: No   Other Topics Concern  . None   Social History Narrative  . None   Family History  Problem Relation Age of Onset  . Breast cancer Mother     sarcoma  . Lung cancer Mother   . Hypertension Mother   . Prostate cancer Father   . Congestive Heart Failure Father   . Heart attack Father   . Prostate cancer Brother   . Down syndrome Son   . Heart disease Maternal Grandfather     MI  . Stomach cancer Maternal Aunt   . Uterine cancer Maternal Aunt   . Colon cancer Neg Hx    Allergies  Allergen Reactions  . Dilaudid [Hydromorphone Hcl] Itching   Prior to Admission medications   Medication Sig Start Date End Date Taking? Authorizing Provider  acetaminophen (TYLENOL) 500 MG tablet Take 500-1,000 mg by mouth every 6 (six) hours as needed for moderate pain or headache.    Yes Historical Provider, MD  aspirin EC 81 MG tablet Take 81 mg by mouth at bedtime.   Yes Historical Provider, MD  B Complex-C (SUPER B COMPLEX PO) Take 1 tablet by mouth daily.   Yes Historical Provider, MD  carvedilol (COREG) 25 MG tablet Take 0.5 tablets (12.5 mg total) by mouth 2 (two) times daily with a meal. Hold if your blood pressure is <120/80 02/28/16  Yes Maryann Mikhail, DO  clonazePAM (KLONOPIN) 1 MG tablet TAKE 1/2 to 1 TABLET BY MOUTH AT BEDTIME. Patient taking differently: Take 0.5 mg by mouth at bedtime.  12/28/15  Yes Mosie Lukes, MD  ferrous sulfate 325 (65 FE) MG tablet Take 325 mg by mouth daily with breakfast.   Yes Historical Provider, MD  folic acid (FOLVITE) 1 MG tablet TAKE ONE TABLET (1 MG TOTAL) BY MOUTH DAILY. Patient taking differently: Take 1 mg by mouth daily.  12/28/15  Yes Mosie Lukes, MD    gabapentin (NEURONTIN) 300 MG capsule Take 1 capsule (300 mg total) by mouth 2 (two) times daily. Reported on 05/24/2015 12/28/15  Yes Mosie Lukes, MD  hydrOXYzine (ATARAX/VISTARIL) 25 MG tablet Take 25 mg by mouth at bedtime.   Yes Historical Provider, MD  Multiple Vitamin (MULTIVITAMIN WITH MINERALS) TABS tablet Take 1 tablet by mouth daily.   Yes Historical Provider, MD  omeprazole (PRILOSEC) 20 MG capsule Take 20 mg by mouth daily  as needed (heartburn).   Yes Historical Provider, MD  OVER THE COUNTER MEDICATION Place 1 drop into both eyes 2 (two) times daily as needed (dry eyes/ irritation). Over the counter lubricating eye drop   Yes Historical Provider, MD  pentosan polysulfate (ELMIRON) 100 MG capsule Take 100 mg by mouth 2 (two) times daily.  02/07/12  Yes Alexzandrew L Perkins, PA-C  Probiotic Product (PROBIOTIC DAILY PO) Take 1 capsule by mouth daily.   Yes Historical Provider, MD  RA KRILL OIL 500 MG CAPS Take 500 mg by mouth daily.    Yes Historical Provider, MD  ranitidine (ZANTAC) 150 MG tablet TAKE 1 TABLET (150 MG TOTAL) BY MOUTH 2 (TWO) TIMES DAILY AS NEEDED FOR HEARTBURN. 03/01/16  Yes Mosie Lukes, MD  rosuvastatin (CRESTOR) 20 MG tablet TAKE 1 TABLET (20 MG TOTAL) BY MOUTH AT BEDTIME. Patient taking differently: Take 20 mg by mouth at bedtime.  11/05/15  Yes Mosie Lukes, MD  telmisartan (MICARDIS) 80 MG tablet Take 0.5 tablets (40 mg total) by mouth daily. Hold if your blood pressure is <120/80 Patient taking differently: Take 80 mg by mouth daily. Hold if your blood pressure is <120/80 02/28/16  Yes Maryann Mikhail, DO  venlafaxine XR (EFFEXOR-XR) 150 MG 24 hr capsule Take 150 mg by mouth daily with breakfast.   Yes Historical Provider, MD     All other systems have been reviewed and were otherwise negative with the exception of those mentioned in the HPI and as above.  Physical Exam: There were no vitals filed for this visit.  General: Alert, no acute  distress Cardiovascular: No pedal edema Respiratory: No cyanosis, no use of accessory musculature Skin: No lesions in the area of chief complaint Neurologic: Sensation intact distally Psychiatric: Patient is competent for consent with normal mood and affect Lymphatic: No axillary or cervical lymphadenopathy  MUSCULOSKELETAL: + TTP mid back  Assessment/Plan: Thoracic 12 compression fracture Plan for Procedure(s): THORACIC 12 KYPHOPLASTY   Sinclair Ship, MD 03/30/2016 7:03 AM

## 2016-03-30 NOTE — Transfer of Care (Signed)
Immediate Anesthesia Transfer of Care Note  Patient: Shannon [name]  Procedure(s) Performed: Procedure(s) with comments: THORACIC 12 KYPHOPLASTY (N/A) - THORACIC 12 KYPHOPLASTY  Patient Location: PACU  Anesthesia Type:General  Level of Consciousness: sedated and responds to stimulation  Airway & Oxygen Therapy: Patient Spontanous Breathing and Patient connected to nasal cannula oxygen  Post-op Assessment: Report given to RN and Post -op Vital signs reviewed and stable  Post vital signs: Reviewed and stable  Last Vitals:  Vitals:   03/30/16 0822  BP: 125/63  Pulse: 63  Resp: 18  Temp: 36.7 C    Last Pain:  Vitals:   03/30/16 0822  TempSrc: Oral  PainSc:       Patients Stated Pain Goal: 7 ([id number])  Complications: No apparent anesthesia complications

## 2016-03-30 NOTE — Anesthesia Procedure Notes (Signed)
Procedure Name: Intubation Date/Time: 03/30/2016 10:55 AM Performed by: Moshe Cipro, Claramae Rigdon ANN Pre-anesthesia Checklist: Patient identified, Emergency Drugs available, Suction available, Timeout performed and Patient being monitored Patient Re-evaluated:Patient Re-evaluated prior to inductionOxygen Delivery Method: Circle system utilized Preoxygenation: Pre-oxygenation with 100% oxygen Intubation Type: IV induction Ventilation: Oral airway inserted - appropriate to patient size Laryngoscope Size: Miller and 2 Grade View: Grade II Tube type: Oral Tube size: 7.0 mm Number of attempts: 1 Airway Equipment and Method: Oral airway and Stylet Placement Confirmation: ETT inserted through vocal cords under direct vision,  positive ETCO2,  breath sounds checked- equal and bilateral and CO2 detector Secured at: 21 cm Tube secured with: Tape Dental Injury: Teeth and Oropharynx as per pre-operative assessment

## 2016-03-30 NOTE — Anesthesia Preprocedure Evaluation (Addendum)
Anesthesia Evaluation  Patient identified by MRN, date of birth, ID band Patient awake    Reviewed: Allergy & Precautions, H&P , NPO status , Patient's Chart, lab work & pertinent test results, reviewed documented beta blocker date and time   Airway Mallampati: III  TM Distance: >3 FB Neck ROM: Full    Dental no notable dental hx. (+) Dental Advisory Given, Teeth Intact,    Pulmonary shortness of breath and with exertion, asthma ,    Pulmonary exam normal breath sounds clear to auscultation       Cardiovascular hypertension, Pt. on medications and Pt. on home beta blockers Normal cardiovascular exam Rhythm:Regular Rate:Normal  cardiomyopathy   Neuro/Psych PSYCHIATRIC DISORDERS Depression negative neurological ROS     GI/Hepatic Neg liver ROS, hiatal hernia, GERD  Medicated,  Endo/Other  negative endocrine ROSMorbid obesity  Renal/GU Renal diseasenegative Renal ROS     Musculoskeletal negative musculoskeletal ROS (+) Arthritis ,   Abdominal   Peds  Hematology  (+) anemia ,   Anesthesia Other Findings   Reproductive/Obstetrics negative OB ROS                            Anesthesia Physical  Anesthesia Plan  ASA: III  Anesthesia Plan: General   Post-op Pain Management:    Induction: Intravenous  Airway Management Planned: Oral ETT  Additional Equipment:   Intra-op Plan:   Post-operative Plan: Extubation in OR  Informed Consent: I have reviewed the patients History and Physical, chart, labs and discussed the procedure including the risks, benefits and alternatives for the proposed anesthesia with the patient or authorized representative who has indicated his/her understanding and acceptance.   Dental advisory given  Plan Discussed with: CRNA  Anesthesia Plan Comments:         Anesthesia Quick Evaluation

## 2016-03-31 ENCOUNTER — Encounter (HOSPITAL_COMMUNITY): Payer: Self-pay | Admitting: Orthopedic Surgery

## 2016-03-31 NOTE — Op Note (Signed)
NAMESOPHIEANN, VANORNUM NO.:  [id number]  MEDICAL RECORD NO.:  [id number]  LOCATION:                               FACILITY:  [facility]  PHYSICIAN:  Shannon Bob, MD      DATE OF BIRTH:  03-22-1945  DATE OF PROCEDURE:  03/30/2016                              OPERATIVE REPORT   PREOPERATIVE DIAGNOSIS:  T12 compression fracture.  POSTOPERATIVE DIAGNOSIS:  T12 compression fracture.  PROCEDURES:  T12 kyphoplasty.  SURGEON:  Shannon Bob, MD  ASSISTANT:  Pricilla Holm, PA-C.  ANESTHESIA:  General endotracheal anesthesia.  COMPLICATIONS:  None.  DISPOSITION:  Stable.  ESTIMATED BLOOD LOSS:  Minimal.  INDICATIONS FOR SURGERY:  Briefly, [name] is a very pleasant 71 year old female, who did present to me with rather severe and debilitating pain in her mid back.  An MRI did reveal edema within the T12 vertebral body.  X-rays did reveal collapse across the T12 vertebral body as well. The patient did have symptoms for approximately 3 weeks and did continue to have ongoing debilitating pain.  She did wish to proceed with the procedure reflected above.  After a full understanding of the risks and benefits of surgery, the patient did elect to proceed.  OPERATIVE DETAILS:  On March 30, 2016, the patient was brought to surgery and general endotracheal anesthesia was administered.  The patient was placed prone on a well-padded flat Jackson bed with a spinal frame. Antibiotics were given and the back was prepped and draped.  Two very small stab incisions were made at the superior and lateral aspect of the T12 pedicles.  Jamshidis were advanced across the T12 pedicles in the usual fashion.  I then drilled and curetted through the T12 cannulas bilaterally.  I then inserted kyphoplasty balloons and I was able to introduce approximately 3 mL of contrast through the balloons bilaterally.  This did result in partial restoration of the superior endplate height.  At this  point, I did proceed with injecting approximately 8 mL of cement into the T12 vertebral body, approximately half through each side.  I did note excellent interdigitation of cement. There was no abnormal extravasation of cement into the spinal canal or any significant extravasation either anteriorly or laterally.  I was very pleased with the final AP and lateral fluoroscopic images.  The cement was then allowed to harden. The wound was then irrigated and closed with 4-0 Monocryl.  Sterile dressing was then applied.  The patient was woken and transferred to recovery in stable condition.     Shannon Bob, MD   ______________________________ Shannon Bob, MD    MD/MEDQ  D:  03/30/2016  T:  03/31/2016  Job:  [id number]

## 2016-03-31 NOTE — Anesthesia Postprocedure Evaluation (Signed)
Anesthesia Post Note  Patient: Shannon [name]  Procedure(s) Performed: Procedure(s) (LRB): THORACIC 12 KYPHOPLASTY (N/A)  Patient location during evaluation: PACU Anesthesia Type: General Level of consciousness: sedated and patient cooperative Pain management: pain level controlled Vital Signs Assessment: post-procedure vital signs reviewed and stable Respiratory status: spontaneous breathing Cardiovascular status: stable Anesthetic complications: no       Last Vitals:  Vitals:   03/30/16 1320 03/30/16 1336  BP:  140/73  Pulse:  60  Resp:    Temp: 36.5 C 36.1 C    Last Pain:  Vitals:   03/30/16 1309  TempSrc:   PainSc: [place]

## 2016-03-31 NOTE — Op Note (Deleted)
  The note originally documented on this encounter has been moved the the encounter in which it belongs.  

## 2016-04-04 ENCOUNTER — Ambulatory Visit (INDEPENDENT_AMBULATORY_CARE_PROVIDER_SITE_OTHER): Payer: Medicare Other | Admitting: Family Medicine

## 2016-04-04 ENCOUNTER — Encounter: Payer: Self-pay | Admitting: Family Medicine

## 2016-04-04 VITALS — BP 118/80 | HR 84 | Temp 97.8°F | Resp 18 | Wt 211.2 lb

## 2016-04-04 DIAGNOSIS — M8008XS Age-related osteoporosis with current pathological fracture, vertebra(e), sequela: Secondary | ICD-10-CM

## 2016-04-04 DIAGNOSIS — M8088XS Other osteoporosis with current pathological fracture, vertebra(e), sequela: Secondary | ICD-10-CM | POA: Diagnosis not present

## 2016-04-04 DIAGNOSIS — I10 Essential (primary) hypertension: Secondary | ICD-10-CM | POA: Diagnosis not present

## 2016-04-04 DIAGNOSIS — N390 Urinary tract infection, site not specified: Secondary | ICD-10-CM | POA: Diagnosis not present

## 2016-04-04 DIAGNOSIS — M810 Age-related osteoporosis without current pathological fracture: Secondary | ICD-10-CM | POA: Diagnosis not present

## 2016-04-04 DIAGNOSIS — K5900 Constipation, unspecified: Secondary | ICD-10-CM | POA: Diagnosis not present

## 2016-04-04 NOTE — Patient Instructions (Addendum)
prolia injections twice a year Increase vitamin D to 2000 IU daily Add a Citracal 1 cap  Osteoporosis Osteoporosis is the thinning and loss of density in the bones. Osteoporosis makes the bones more brittle, fragile, and likely to break (fracture). Over time, osteoporosis can cause the bones to become so weak that they fracture after a simple fall. The bones most likely to fracture are the bones in the hip, wrist, and spine. What are the causes? The exact cause is not known. What increases the risk? Anyone can develop osteoporosis. You may be at greater risk if you have a family history of the condition or have poor nutrition. You may also have a higher risk if you are:  Female.  59 years old or older.  A smoker.  Not physically active.  White or Asian.  Slender. What are the signs or symptoms? A fracture might be the first sign of the disease, especially if it results from a fall or injury that would not usually cause a bone to break. Other signs and symptoms include:  Low back and neck pain.  Stooped posture.  Height loss. How is this diagnosed? To make a diagnosis, your health care provider may:  Take a medical history.  Perform a physical exam.  Order tests, such as:  A bone mineral density test.  A dual-energy X-ray absorptiometry test. How is this treated? The goal of osteoporosis treatment is to strengthen your bones to reduce your risk of a fracture. Treatment may involve:  Making lifestyle changes, such as:  Eating a diet rich in calcium.  Doing weight-bearing and muscle-strengthening exercises.  Stopping tobacco use.  Limiting alcohol intake.  Taking medicine to slow the process of bone loss or to increase bone density.  Monitoring your levels of calcium and vitamin D. Follow these instructions at home:  Include calcium and vitamin D in your diet. Calcium is important for bone health, and vitamin D helps the body absorb calcium.  Perform  weight-bearing and muscle-strengthening exercises as directed by your health care provider.  Do not use any tobacco products, including cigarettes, chewing tobacco, and electronic cigarettes. If you need help quitting, ask your health care provider.  Limit your alcohol intake.  Take medicines only as directed by your health care provider.  Keep all follow-up visits as directed by your health care provider. This is important.  Take precautions at home to lower your risk of falling, such as:  Keeping rooms well lit and clutter free.  Installing safety rails on stairs.  Using rubber mats in the bathroom and other areas that are often wet or slippery. Get help right away if: You fall or injure yourself. This information is not intended to replace advice given to you by your health care provider. Make sure you discuss any questions you have with your health care provider. Document Released: 10/26/2004 Document Revised: 06/21/2015 Document Reviewed: 06/26/2013 Elsevier Interactive Patient Education  2017 [employer].

## 2016-04-04 NOTE — Progress Notes (Signed)
Pre visit review using our clinic review tool, if applicable. No additional management support is needed unless otherwise documented below in the visit note. 

## 2016-04-04 NOTE — Progress Notes (Signed)
Subjective:  I acted as a Education administrator for Dr. Charlett Blake. [name], [place]   Patient ID: Shannon [name], female    DOB: 1945/04/08, 71 y.o.   MRN: [id number]  Chief Complaint  Patient presents with  . Follow-up    HPI  Patient is in today for follow up after undergoing kyphoplasty for a vertebral fracture. She is still in her brace and her pain is greatly improved. No fevers, malaise or fatigue suggestive of any systemic concerns. She has been using stool softeners to keep her bowels moving. She denies polyuria or polydipsia. Denies CP/palp/SOB/HA/congestion/fevers/GI or GU c/o. Taking meds as prescribed Patient Care Team: Mosie Lukes, MD as PCP - General (Family Medicine) Domingo Pulse, MD as Consulting Physician (Urology) Calvert Cantor, MD as Consulting Physician (Ophthalmology) Lelon Perla, MD as Consulting Physician (Cardiology)   Past Medical History:  Diagnosis Date  . 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 Anemia 08/28/2011  . Asthma    as a child  . Brain tumor ([place])   . Breast cancer ([place])    right breast  . Carpal tunnel syndrome of left wrist   . Cataracts, bilateral 12/28/2015  . Constipation 03/03/2012  . DEPRESSION 08/11/2008  . Dermatitis   . Facial fracture ([place])   . GERD 08/11/2008  . H/O measles   . H/O mumps   . History of blood transfusion   . History of chicken pox   . History of hiatal hernia   . 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 HYPERLIPIDEMIA 08/11/2008  . HYPERTENSION 08/11/2008  . Hyponatremia 08/28/2011  . 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 Interstitial cystitis   . LIPOMA 02/10/2009  . Medicare annual wellness visit, subsequent 12/27/2014   Follows with Dr Amalia Hailey of urology Follows with Dr Posey Pronto at [facility] for opthamology Follows with Dr [name] of dermatology Follows with [facility]ology, last scope done in 2011 repeat in 10 years Last [facility] July 2015 should  repeat every 1-2 years Last Pap December 2015, repeat in 3 years.   . Melanoma ([place]) 2008   DR. Ronnald Ramp  . Neuropathy ([place])   . NICM (nonischemic cardiomyopathy) ([place]) 03/08/2010   likely 2/2 chemotx - a. Echo 2012: EF 45-50%;  b. Lex MV 2/12:  low risk, apical defect (small area of ischemia vs shifting breast atten);  c.  Echo 7/12: Normal wall thickness, EF 60-65%, normal wall motion, grade 1 diastolic dysfunction, mild LAE, PASP 32;   d. Lex MV 11/13:  EF 76%, no ischemia  . OA (osteoarthritis) 09/07/2011  . Obesity 09/28/2014  . Osteoporosis   . PREDIABETES 06/15/2009  . Recurrent falls 12/28/2015  . Shortness of breath   . UTI (lower urinary tract infection) 03/03/2012  . Vertebral fracture, osteoporotic, sequela 04/05/2016    Past Surgical History:  Procedure Laterality Date  . BREAST LUMPECTOMY  04/2009   RIGHT FOR BREAST CANCER-CHEMO/RADIATION X 1 YEAR  . BREAST REDUCTION SURGERY Bilateral 05/04/2014   Procedure: MAMMARY REDUCTION  (BREAST);  Surgeon: Cristine Polio, MD;  Location: [facility];  Service: Plastics;  Laterality: Bilateral;  . CARPAL TUNNEL RELEASE     L wrist, ulnar nerve moved  . CHOLECYSTECTOMY    . CRANIOTOMY FOR TUMOR  2000  . ELBOW SURGERY     left  . KNEE ARTHROSCOPY Left 10/12/2014   Procedure: LEFT KNEE ARTHROSCOPY ;  Surgeon: Gaynelle Arabian, MD;  Location: [facility];  Service: Orthopedics;  Laterality: Left;  . KYPHOPLASTY N/A 03/30/2016   Procedure: THORACIC 12 KYPHOPLASTY;  Surgeon: Phylliss Bob, MD;  Location: [facility];  Service: Orthopedics;  Laterality: N/A;  THORACIC 12 KYPHOPLASTY  . LEFT HEART CATHETERIZATION WITH CORONARY ANGIOGRAM N/A 12/16/2013   Procedure: LEFT HEART CATHETERIZATION WITH CORONARY ANGIOGRAM;  Surgeon: Peter M [name], MD;  Location: [facility];  Service: Cardiovascular;  Laterality: N/A;  . LIPOMA EXCISION  03/28/2009   right leg  . MELANOMA EXCISION    . PORT-A-CATH REMOVAL  11/30/2010   [place]  . porta cath    . PORTACATH  PLACEMENT  may 2011  . SYNOVECTOMY Left 10/12/2014   Procedure: WITH SYNOVECTOMY;  Surgeon: Gaynelle Arabian, MD;  Location: [facility];  Service: Orthopedics;  Laterality: Left;  . TONSILLECTOMY  1958  . TOTAL KNEE ARTHROPLASTY  02/05/2012   Procedure: TOTAL KNEE ARTHROPLASTY;  Surgeon: Gearlean Alf, MD;  Location: [facility];  Service: Orthopedics;  Laterality: Right;  . TOTAL KNEE ARTHROPLASTY Left 02/10/2013   Procedure: LEFT TOTAL KNEE ARTHROPLASTY;  Surgeon: Gearlean Alf, MD;  Location: [facility];  Service: Orthopedics;  Laterality: Left;  . total knee raplacement  01-2012   Right Knee  . TUBAL LIGATION  1997    Family History  Problem Relation Age of Onset  . Breast cancer Mother     sarcoma  . Lung cancer Mother   . Hypertension Mother   . Prostate cancer Father   . Congestive Heart Failure Father   . Heart attack Father   . Prostate cancer Brother   . Down syndrome Son   . Heart disease Maternal Grandfather     MI  . Stomach cancer Maternal Aunt   . Uterine cancer Maternal Aunt   . Colon cancer Neg Hx     Social History   Social History  . Marital status: Married    Spouse name: N/A  . Number of children: 3  . Years of education: N/A   Occupational History  . boutique owner [employer]   Social History Main Topics  . Smoking status: Never Smoker  . Smokeless tobacco: Never Used  . Alcohol use No  . Drug use: No  . Sexual activity: No   Other Topics Concern  . Not on file   Social History Narrative  . No narrative on file    Outpatient Medications Prior to Visit  Medication Sig Dispense Refill  . acetaminophen (TYLENOL) 500 MG tablet Take 500-1,000 mg by mouth every 6 (six) hours as needed for moderate pain or headache.     . B Complex-C (SUPER B COMPLEX PO) Take 1 tablet by mouth daily.    . carvedilol (COREG) 25 MG tablet Take 0.5 tablets (12.5 mg total) by mouth 2 (two) times daily with a meal. Hold if your blood pressure is <120/80 30 tablet 0  .  clonazePAM (KLONOPIN) 1 MG tablet TAKE 1/2 to 1 TABLET BY MOUTH AT BEDTIME. (Patient taking differently: Take 0.5 mg by mouth at bedtime. ) 30 tablet 5  . ferrous sulfate 325 (65 FE) MG tablet Take 325 mg by mouth daily with breakfast.    . folic acid (FOLVITE) 1 MG tablet TAKE ONE TABLET (1 MG TOTAL) BY MOUTH DAILY. (Patient taking differently: Take 1 mg by mouth daily. ) 90 tablet 3  . gabapentin (NEURONTIN) 300 MG capsule Take 1 capsule (300 mg total) by mouth 2 (two)  times daily. Reported on 05/24/2015 180 capsule 1  . hydrOXYzine (ATARAX/VISTARIL) 25 MG tablet Take 25 mg by mouth at bedtime.    . Multiple Vitamin (MULTIVITAMIN WITH MINERALS) TABS tablet Take 1 tablet by mouth daily.    Marland Kitchen omeprazole (PRILOSEC) 20 MG capsule Take 20 mg by mouth daily as needed (heartburn).    Marland Kitchen OVER THE COUNTER MEDICATION Place 1 drop into both eyes 2 (two) times daily as needed (dry eyes/ irritation). Over the counter lubricating eye drop    . pentosan polysulfate (ELMIRON) 100 MG capsule Take 100 mg by mouth 2 (two) times daily.     . Probiotic Product (PROBIOTIC DAILY PO) Take 1 capsule by mouth daily.    . ranitidine (ZANTAC) 150 MG tablet TAKE 1 TABLET (150 MG TOTAL) BY MOUTH 2 (TWO) TIMES DAILY AS NEEDED FOR HEARTBURN. 180 tablet 1  . rosuvastatin (CRESTOR) 20 MG tablet TAKE 1 TABLET (20 MG TOTAL) BY MOUTH AT BEDTIME. (Patient taking differently: Take 20 mg by mouth at bedtime. ) 90 tablet 3  . telmisartan (MICARDIS) 80 MG tablet Take 0.5 tablets (40 mg total) by mouth daily. Hold if your blood pressure is <120/80 (Patient taking differently: Take 80 mg by mouth daily. Hold if your blood pressure is <120/80) 30 tablet 0  . venlafaxine XR (EFFEXOR-XR) 150 MG 24 hr capsule Take 150 mg by mouth daily.      No facility-administered medications prior to visit.     Allergies  Allergen Reactions  . Dilaudid [Hydromorphone Hcl] Itching    Review of Systems  Constitutional: Negative for fever and  malaise/fatigue.  HENT: Negative for congestion.   Eyes: Negative for blurred vision.  Respiratory: Negative for shortness of breath.   Cardiovascular: Negative for chest pain, palpitations and leg swelling.  Gastrointestinal: Negative for abdominal pain, blood in stool and nausea.  Genitourinary: Negative for dysuria and frequency.  Musculoskeletal: Positive for back pain. Negative for falls.  Skin: Negative for rash.  Neurological: Negative for dizziness, loss of consciousness and headaches.  Endo/Heme/Allergies: Negative for environmental allergies.  Psychiatric/Behavioral: Negative for depression. The patient is not nervous/anxious.        Objective:    Physical Exam  Constitutional: She is oriented to person, place, and time. She appears well-developed and well-nourished. No distress.  HENT:  Head: Normocephalic and atraumatic.  Nose: Nose normal.  Eyes: Right eye exhibits no discharge. Left eye exhibits no discharge.  Neck: Normal range of motion. Neck supple.  Cardiovascular: Normal rate and regular rhythm.   No murmur heard. Pulmonary/Chest: Effort normal and breath sounds normal.  Abdominal: Soft. Bowel sounds are normal. There is no tenderness.  Musculoskeletal: She exhibits no edema.  Neurological: She is alert and oriented to person, place, and time.  Skin: Skin is warm and dry.  Psychiatric: She has a normal mood and affect.  Nursing note and vitals reviewed.   BP 118/80 (BP Location: Left Arm, Patient Position: Sitting, Cuff Size: Large)   Pulse 84   Temp 97.8 F (36.6 C) (Oral)   Resp 18   Wt 211 lb 3.2 oz (95.8 kg)   LMP 01/30/1994   SpO2 97%   BMI 36.25 kg/m  Wt Readings from Last 3 Encounters:  04/04/16 211 lb 3.2 oz (95.8 kg)  03/30/16 211 lb (95.7 kg)  03/13/16 211 lb (95.7 kg)     Lab Results  Component Value Date   WBC 5.9 03/30/2016   HGB 11.5 (L) 03/30/2016   HCT 37.0  03/30/2016   PLT 240 03/30/2016   GLUCOSE 131 (H) 03/30/2016   CHOL  176 11/04/2015   TRIG 264.0 (H) 11/04/2015   HDL 45.70 11/04/2015   LDLDIRECT 98.0 11/04/2015   LDLCALC 120 (H) 10/10/2013   ALT 24 03/30/2016   AST 27 03/30/2016   NA 142 03/30/2016   K 4.3 03/30/2016   CL 108 03/30/2016   CREATININE 0.98 03/30/2016   BUN 11 03/30/2016   CO2 25 03/30/2016   TSH 2.06 11/04/2015   INR 1.06 03/30/2016   HGBA1C 5.9 11/04/2015    Lab Results  Component Value Date   TSH 2.06 11/04/2015   Lab Results  Component Value Date   WBC 5.9 03/30/2016   HGB 11.5 (L) 03/30/2016   HCT 37.0 03/30/2016   MCV 91.8 03/30/2016   PLT 240 03/30/2016   Lab Results  Component Value Date   NA 142 03/30/2016   K 4.3 03/30/2016   CHLORIDE 107 11/24/2014   CO2 25 03/30/2016   GLUCOSE 131 (H) 03/30/2016   BUN 11 03/30/2016   CREATININE 0.98 03/30/2016   BILITOT 0.6 03/30/2016   ALKPHOS 119 03/30/2016   AST 27 03/30/2016   ALT 24 03/30/2016   PROT 6.6 03/30/2016   ALBUMIN 3.8 03/30/2016   CALCIUM 9.6 03/30/2016   ANIONGAP 9 03/30/2016   EGFR 70 (L) 11/24/2014   GFR 69.99 11/04/2015   Lab Results  Component Value Date   CHOL 176 11/04/2015   Lab Results  Component Value Date   HDL 45.70 11/04/2015   Lab Results  Component Value Date   LDLCALC 120 (H) 10/10/2013   Lab Results  Component Value Date   TRIG 264.0 (H) 11/04/2015   Lab Results  Component Value Date   CHOLHDL 4 11/04/2015   Lab Results  Component Value Date   HGBA1C 5.9 11/04/2015       Assessment & Plan:   Problem List Items Addressed This Visit    Essential hypertension    Well controlled, no changes to meds. Encouraged heart healthy diet such as the DASH diet and exercise as tolerated.       Osteoporosis - Primary    With recent fracture she agrees to initiation of Prolia      Relevant Orders   Vitamin D (25 hydroxy)   UTI (urinary tract infection)   Relevant Orders   Urinalysis   Urine Culture   Constipation    Doing well despite recent surgery using stool  softeners or Miralax. Encouraged increased hydration and fiber in diet. Daily probiotics. If bowels not moving can use MOM 2 tbls po in 4 oz of warm prune juice by mouth every 2-3 days. If no results then repeat in 4 hours with  Dulcolax suppository pr, may repeat again in 4 more hours as needed. Seek care if symptoms worsen. Consider daily Miralax and/or Dulcolax if symptoms persist.       Vertebral fracture, osteoporotic, sequela    She has had kyphoplasty and is doing much better, still in her brace but healing well.         I am having Ms. Algeo maintain her hydrOXYzine, pentosan polysulfate, B Complex-C (SUPER B COMPLEX PO), acetaminophen, rosuvastatin, gabapentin, clonazePAM, folic acid, ferrous sulfate, multivitamin with minerals, OVER THE COUNTER MEDICATION, omeprazole, carvedilol, telmisartan, ranitidine, venlafaxine XR, and Probiotic Product (PROBIOTIC DAILY PO).  No orders of the defined types were placed in this encounter.   CMA served as Education administrator during this visit. History, Physical and  Plan performed by medical provider. Documentation and orders reviewed and attested to.  Penni Homans, MD

## 2016-04-05 ENCOUNTER — Encounter: Payer: Self-pay | Admitting: Family Medicine

## 2016-04-05 DIAGNOSIS — M8008XS Age-related osteoporosis with current pathological fracture, vertebra(e), sequela: Secondary | ICD-10-CM

## 2016-04-05 HISTORY — DX: Age-related osteoporosis with current pathological fracture, vertebra(e), sequela: M80.08XS

## 2016-04-05 NOTE — Assessment & Plan Note (Signed)
Well controlled, no changes to meds. Encouraged heart healthy diet such as the DASH diet and exercise as tolerated.  °

## 2016-04-05 NOTE — Assessment & Plan Note (Signed)
Doing well despite recent surgery using stool softeners or Miralax. Encouraged increased hydration and fiber in diet. Daily probiotics. If bowels not moving can use MOM 2 tbls po in 4 oz of warm prune juice by mouth every 2-3 days. If no results then repeat in 4 hours with  Dulcolax suppository pr, may repeat again in 4 more hours as needed. Seek care if symptoms worsen. Consider daily Miralax and/or Dulcolax if symptoms persist.

## 2016-04-05 NOTE — Assessment & Plan Note (Signed)
She has had kyphoplasty and is doing much better, still in her brace but healing well.

## 2016-04-05 NOTE — Assessment & Plan Note (Signed)
With recent fracture she agrees to initiation of Prolia

## 2016-04-06 ENCOUNTER — Telehealth: Payer: Self-pay | Admitting: Family Medicine

## 2016-04-06 NOTE — Telephone Encounter (Signed)
Please do the PA for her Prolia.

## 2016-04-06 NOTE — Telephone Encounter (Signed)
-----   Message from Mosie Lukes, MD sent at 04/05/2016 10:22 PM EST ----- This patient agrees to start Prolia. She has Osteoporosis and she has used Fosamax for more than 5 years.

## 2016-04-10 ENCOUNTER — Ambulatory Visit: Payer: Medicare Other | Admitting: Family Medicine

## 2016-04-12 ENCOUNTER — Other Ambulatory Visit: Payer: Self-pay | Admitting: Family Medicine

## 2016-04-12 DIAGNOSIS — Z9889 Other specified postprocedural states: Secondary | ICD-10-CM | POA: Diagnosis not present

## 2016-04-13 ENCOUNTER — Other Ambulatory Visit (INDEPENDENT_AMBULATORY_CARE_PROVIDER_SITE_OTHER): Payer: Medicare Other

## 2016-04-13 DIAGNOSIS — M810 Age-related osteoporosis without current pathological fracture: Secondary | ICD-10-CM

## 2016-04-13 DIAGNOSIS — N390 Urinary tract infection, site not specified: Secondary | ICD-10-CM

## 2016-04-14 ENCOUNTER — Telehealth: Payer: Self-pay | Admitting: Family Medicine

## 2016-04-14 LAB — URINALYSIS
Bilirubin Urine: NEGATIVE
HGB URINE DIPSTICK: NEGATIVE
Ketones, ur: NEGATIVE
LEUKOCYTES UA: NEGATIVE
NITRITE: NEGATIVE
Specific Gravity, Urine: 1.01 (ref 1.000–1.030)
TOTAL PROTEIN, URINE-UPE24: NEGATIVE
UROBILINOGEN UA: 0.2 (ref 0.0–1.0)
Urine Glucose: NEGATIVE
pH: 5.5 (ref 5.0–8.0)

## 2016-04-14 LAB — VITAMIN D 25 HYDROXY (VIT D DEFICIENCY, FRACTURES): VITD: 34.17 ng/mL (ref 30.00–100.00)

## 2016-04-14 MED ORDER — VENLAFAXINE HCL ER 150 MG PO CP24: 150.0000 mg | ORAL_CAPSULE | Freq: Every day | ORAL | 1 refills | Status: DC

## 2016-04-14 NOTE — Telephone Encounter (Signed)
[facility] informed and [phone]----patient is aware as did call her.

## 2016-04-14 NOTE — Telephone Encounter (Signed)
Caller name: Relationship to patient: Self Can be reached: [phone]  Pharmacy:  Reason for call: Patient request a call back to discuss capsule vs tablet of venlafaxine XR (EFFEXOR-XR) 150 MG 24 hr capsule

## 2016-04-15 LAB — URINE CULTURE: ORGANISM ID, BACTERIA: NO GROWTH

## 2016-04-19 ENCOUNTER — Other Ambulatory Visit: Payer: Self-pay | Admitting: Family Medicine

## 2016-04-19 ENCOUNTER — Telehealth: Payer: Self-pay | Admitting: Family Medicine

## 2016-04-19 DIAGNOSIS — J069 Acute upper respiratory infection, unspecified: Secondary | ICD-10-CM | POA: Diagnosis not present

## 2016-04-19 DIAGNOSIS — J45998 Other asthma: Secondary | ICD-10-CM | POA: Diagnosis not present

## 2016-04-19 MED ORDER — ALBUTEROL SULFATE HFA 108 (90 BASE) MCG/ACT IN AERS: 2.0000 | INHALATION_SPRAY | Freq: Four times a day (QID) | RESPIRATORY_TRACT | 0 refills | Status: DC | PRN

## 2016-04-19 NOTE — Telephone Encounter (Signed)
Pt called in because she said that around this time of year her asthma give her trouble. Pt says that Dr. B usually will provide her with an inhaler. Pt says that she is taking care of her sick son so she is unable to come into the office to be seen. Pt would like to know if provider is willing to send a inhaler in to the pharmacy for her?   Advised that her PCP is out of the office.    Ph[facility]: [facility] [place]  -- incase.

## 2016-04-19 NOTE — Telephone Encounter (Signed)
I have sent in Albuterol for her please let her know.

## 2016-04-20 NOTE — Telephone Encounter (Signed)
Called left detailed message prescription sent in. 

## 2016-04-27 ENCOUNTER — Ambulatory Visit (INDEPENDENT_AMBULATORY_CARE_PROVIDER_SITE_OTHER): Payer: Medicare Other | Admitting: Specialist

## 2016-05-12 ENCOUNTER — Other Ambulatory Visit: Payer: Self-pay | Admitting: Adult Health

## 2016-05-12 DIAGNOSIS — C50311 Malignant neoplasm of lower-inner quadrant of right female breast: Secondary | ICD-10-CM

## 2016-05-12 DIAGNOSIS — Z171 Estrogen receptor negative status [ER-]: Principal | ICD-10-CM

## 2016-05-31 NOTE — Telephone Encounter (Signed)
Received verification of benefits for Prolia   No PA required $183 deductible has been met and secondary insurance will cover 100% of excess charges.   Patient should not owe any out of pocket

## 2016-06-01 NOTE — Telephone Encounter (Signed)
Called left message to call back 

## 2016-06-02 NOTE — Telephone Encounter (Signed)
Patient agreed to PA and will get prolia She will call back later next week to schedule a nurse visit. Did let Gilmore Laroche know to order her prolia

## 2016-06-02 NOTE — Telephone Encounter (Signed)
Called left message to call back 

## 2016-06-02 NOTE — Telephone Encounter (Signed)
Called left message on cell to call back. Last cmp -- 03/30/2016 Last bone density--- 02/10/14

## 2016-06-05 ENCOUNTER — Telehealth: Payer: Self-pay | Admitting: *Deleted

## 2016-06-05 ENCOUNTER — Telehealth: Payer: Self-pay | Admitting: Adult Health

## 2016-06-05 NOTE — Telephone Encounter (Signed)
Confirmed r/s LTS appt to 6/1 at 230 pm per LOS

## 2016-06-05 NOTE — Telephone Encounter (Signed)
FYI "I am in the hospital with my son.  He is a special case, I have to stay with him in the hospital.  I expect he will be here another two weeks.  I hate to cancel for tomorrow but I need this appointment moved out two weeks.  Call my cell number [phone]."    Scheduling message sent.

## 2016-06-06 ENCOUNTER — Other Ambulatory Visit: Payer: Medicare Other

## 2016-06-06 ENCOUNTER — Encounter: Payer: Medicare Other | Admitting: Adult Health

## 2016-06-06 ENCOUNTER — Encounter: Payer: Medicare Other | Admitting: Nurse Practitioner

## 2016-06-07 ENCOUNTER — Telehealth: Payer: Self-pay | Admitting: *Deleted

## 2016-06-07 NOTE — Telephone Encounter (Signed)
-----   Message from Jamesetta Orleans, [place] sent at 06/02/2016  4:46 PM EDT ----- Please order her a prolia shot. She will schedule a nurse visit later next week. Shirlean Mylar

## 2016-06-07 NOTE — Telephone Encounter (Signed)
Prolia received and in fridge.

## 2016-06-08 NOTE — Telephone Encounter (Signed)
She is aware 

## 2016-06-14 ENCOUNTER — Encounter: Payer: Self-pay | Admitting: Gynecology

## 2016-06-20 ENCOUNTER — Other Ambulatory Visit: Payer: Self-pay | Admitting: Family Medicine

## 2016-06-28 DIAGNOSIS — H109 Unspecified conjunctivitis: Secondary | ICD-10-CM | POA: Diagnosis not present

## 2016-06-28 DIAGNOSIS — I959 Hypotension, unspecified: Secondary | ICD-10-CM | POA: Diagnosis not present

## 2016-06-29 ENCOUNTER — Telehealth: Payer: Self-pay | Admitting: Adult Health

## 2016-06-29 NOTE — Telephone Encounter (Signed)
Patient called and reschedule her 6/1 appointment because her son is in the hospital and she reschedule for 7/9 at 10:30 for lab and to see Mendel Ryder

## 2016-06-30 ENCOUNTER — Encounter: Payer: Medicare Other | Admitting: Adult Health

## 2016-06-30 ENCOUNTER — Other Ambulatory Visit: Payer: Medicare Other

## 2016-07-04 ENCOUNTER — Encounter: Payer: Medicare Other | Admitting: Nurse Practitioner

## 2016-07-04 ENCOUNTER — Other Ambulatory Visit: Payer: Medicare Other

## 2016-07-10 ENCOUNTER — Ambulatory Visit: Payer: Medicare Other | Admitting: Family Medicine

## 2016-07-31 DIAGNOSIS — L908 Other atrophic disorders of skin: Secondary | ICD-10-CM | POA: Diagnosis not present

## 2016-07-31 DIAGNOSIS — H2513 Age-related nuclear cataract, bilateral: Secondary | ICD-10-CM | POA: Diagnosis not present

## 2016-08-04 ENCOUNTER — Other Ambulatory Visit: Payer: Self-pay | Admitting: Family Medicine

## 2016-08-07 ENCOUNTER — Ambulatory Visit (HOSPITAL_BASED_OUTPATIENT_CLINIC_OR_DEPARTMENT_OTHER): Payer: Medicare Other | Admitting: Adult Health

## 2016-08-07 ENCOUNTER — Encounter: Payer: Self-pay | Admitting: Adult Health

## 2016-08-07 ENCOUNTER — Other Ambulatory Visit: Payer: Self-pay | Admitting: Adult Health

## 2016-08-07 ENCOUNTER — Other Ambulatory Visit (HOSPITAL_BASED_OUTPATIENT_CLINIC_OR_DEPARTMENT_OTHER): Payer: Medicare Other

## 2016-08-07 VITALS — BP 138/53 | HR 75 | Temp 98.1°F | Resp 16 | Ht 64.0 in | Wt 196.0 lb

## 2016-08-07 DIAGNOSIS — C50311 Malignant neoplasm of lower-inner quadrant of right female breast: Secondary | ICD-10-CM

## 2016-08-07 DIAGNOSIS — Z853 Personal history of malignant neoplasm of breast: Secondary | ICD-10-CM

## 2016-08-07 DIAGNOSIS — Z171 Estrogen receptor negative status [ER-]: Principal | ICD-10-CM

## 2016-08-07 DIAGNOSIS — N63 Unspecified lump in unspecified breast: Secondary | ICD-10-CM

## 2016-08-07 DIAGNOSIS — B369 Superficial mycosis, unspecified: Secondary | ICD-10-CM

## 2016-08-07 LAB — CBC WITH DIFFERENTIAL/PLATELET
BASO%: 0.4 % (ref 0.0–2.0)
BASOS ABS: 0 10*3/uL (ref 0.0–0.1)
EOS%: 9.4 % — AB (ref 0.0–7.0)
Eosinophils Absolute: 0.5 10*3/uL (ref 0.0–0.5)
HCT: 37.7 % (ref 34.8–46.6)
HGB: 11.9 g/dL (ref 11.6–15.9)
LYMPH%: 25.3 % (ref 14.0–49.7)
MCH: 28.7 pg (ref 25.1–34.0)
MCHC: 31.6 g/dL (ref 31.5–36.0)
MCV: 90.8 fL (ref 79.5–101.0)
MONO#: 0.5 10*3/uL (ref 0.1–0.9)
MONO%: 8.8 % (ref 0.0–14.0)
NEUT#: 3.2 10*3/uL (ref 1.5–6.5)
NEUT%: 56.1 % (ref 38.4–76.8)
Platelets: 177 10*3/uL (ref 145–400)
RBC: 4.15 10*6/uL (ref 3.70–5.45)
RDW: 14.6 % — ABNORMAL HIGH (ref 11.2–14.5)
WBC: 5.7 10*3/uL (ref 3.9–10.3)
lymph#: 1.4 10*3/uL (ref 0.9–3.3)

## 2016-08-07 LAB — COMPREHENSIVE METABOLIC PANEL
ALT: 26 U/L (ref 0–55)
ANION GAP: 7 meq/L (ref 3–11)
AST: 25 U/L (ref 5–34)
Albumin: 3.9 g/dL (ref 3.5–5.0)
Alkaline Phosphatase: 107 U/L (ref 40–150)
BUN: 10.3 mg/dL (ref 7.0–26.0)
CHLORIDE: 107 meq/L (ref 98–109)
CO2: 30 meq/L — AB (ref 22–29)
CREATININE: 0.8 mg/dL (ref 0.6–1.1)
Calcium: 9.4 mg/dL (ref 8.4–10.4)
EGFR: 72 mL/min/{1.73_m2} — ABNORMAL LOW (ref >90)
Glucose: 105 mg/dl (ref 70–140)
POTASSIUM: 3.9 meq/L (ref 3.5–5.1)
Sodium: 144 mEq/L (ref 136–145)
Total Bilirubin: 0.36 mg/dL (ref 0.20–1.20)
Total Protein: 6.5 g/dL (ref 6.4–8.3)

## 2016-08-07 MED ORDER — NYSTATIN 100000 UNIT/GM EX POWD: Freq: Four times a day (QID) | CUTANEOUS | 0 refills | Status: DC

## 2016-08-07 NOTE — Telephone Encounter (Signed)
[facility] [place]

## 2016-08-07 NOTE — Telephone Encounter (Signed)
Requesting:   clonazepam Contract  none UDS   none Last OV    04/04/16---future appt 08/14/16 Last Refill   #30 with 5 refills on 12/28/15  Please Advise

## 2016-08-07 NOTE — Progress Notes (Signed)
 CLINIC:  Survivorship   REASON FOR VISIT:  Routine follow-up for history of breast cancer.   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)   04/2009 Initial Biopsy    Right breast core needle bx: IDC, ER/PR-, HER2/neu positive, Ki67 58%      04/2009 Clinical Stage    Stage IA: T1c No       - 08/2009 Neo-Adjuvant Chemotherapy    Taxol/ carbo/trastuzumab x 2, then Taxol/trastuzumab weekly x 3 (with last cycle with weekly carbo), A/C x 2 cycles with doxorubicin by continuous infusion (chemo poorly tolerated)      09/2009 Definitive Surgery    Right lumpectomy/SLNB: complete pathologic response       - 11/2009 Radiation Therapy    Adjuvant RT: right breast      09/2009 - 01/2010 Chemotherapy    Maintenance trastuzumab; discontinued due to drop in EF, which has since respolved        INTERVAL HISTORY:  Ms. Crimi presents to the Survivorship [facility] today for routine follow-up for her history of breast cancer.  Overall, she reports feeling quite well. Her special needs son recently was discharged fromt he hospital 3 weeks ago from a CVA.  He has a trach and feeding tube.  She is doing well.  She did undergo mammo with concern in the right breast and repeat is due now.  She follows with her PCP and cardiologist regularly (did have to stop Trastuzumab due to EF decline during maintenance therapy).  She did have some plastic surgery with mammoplasty, and she has some redness and some changes at her mammoplasty that has been present x 1 year.  She was lost to follow up with her plastic surgeon, due to her husband having a massive heart attack around the time of her appointment.      REVIEW OF SYSTEMS:  Review of Systems  Constitutional: Negative for appetite change, chills, fatigue, fever and unexpected weight change.  HENT:   Negative for hearing loss and lump/mass.   Eyes: Negative for eye problems and icterus.  Respiratory: Negative  for chest tightness, cough and shortness of breath.   Cardiovascular: Negative for chest pain, leg swelling and palpitations.  Gastrointestinal: Negative for abdominal distention and abdominal pain.  Endocrine: Negative for hot flashes.  Genitourinary: Negative for difficulty urinating.   Musculoskeletal: Negative for arthralgias and back pain.  Skin: Negative for itching and rash.  Neurological: Negative for dizziness, extremity weakness and headaches.  Hematological: Negative for adenopathy. Does not bruise/bleed easily.  Psychiatric/Behavioral: Negative for depression. The patient is not nervous/anxious.   Breast: Denies any new nodularity, masses, tenderness, nipple changes, or nipple discharge.       PAST MEDICAL/SURGICAL HISTORY:  Past Medical History:  Diagnosis Date  . 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 Anemia 08/28/2011  . Asthma    as a child  . Brain tumor (HCC)   . Breast cancer (HCC)    right breast  . Carpal tunnel syndrome of left wrist   . Cataracts, bilateral 12/28/2015  . Constipation 03/03/2012  . DEPRESSION 08/11/2008  . Dermatitis   . Facial fracture (HCC)   . GERD 08/11/2008  . H/O measles   . H/O mumps   . History of blood transfusion   . History of chicken pox   . History of hiatal hernia   . Hx breast cancer, IDC, Right, 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/2009     BRCA 2 NEGATIVE, CHEMO AND RADIATION X 1 YR  . HYPERLIPIDEMIA 08/11/2008  . HYPERTENSION 08/11/2008  . Hyponatremia 08/28/2011  . 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 Interstitial cystitis   . LIPOMA 02/10/2009  . Medicare annual wellness visit, subsequent 12/27/2014   Follows with Dr Evans of urology Follows with Dr Patel at [facility] for opthamology Follows with Dr Jordan of dermatology Follows with [facility]ology, last scope done in 2011 repeat in 10 years Last MGM July 2015 should repeat every 1-2 years Last Pap December 2015, repeat in 3 years.   . Melanoma (HCC) 2008   DR.  JONES  . Neuropathy (HCC)   . NICM (nonischemic cardiomyopathy) (HCC) 03/08/2010   likely 2/2 chemotx - a. Echo 2012: EF 45-50%;  b. Lex MV 2/12:  low risk, apical defect (small area of ischemia vs shifting breast atten);  c.  Echo 7/12: Normal wall thickness, EF 60-65%, normal wall motion, grade 1 diastolic dysfunction, mild LAE, PASP 32;   d. Lex MV 11/13:  EF 76%, no ischemia  . OA (osteoarthritis) 09/07/2011  . Obesity 09/28/2014  . Osteoporosis   . PREDIABETES 06/15/2009  . Recurrent falls 12/28/2015  . Shortness of breath   . UTI (lower urinary tract infection) 03/03/2012  . Vertebral fracture, osteoporotic, sequela 04/05/2016   Past Surgical History:  Procedure Laterality Date  . BREAST LUMPECTOMY  04/2009   RIGHT FOR BREAST CANCER-CHEMO/RADIATION X 1 YEAR  . BREAST REDUCTION SURGERY Bilateral 05/04/2014   Procedure: MAMMARY REDUCTION  (BREAST);  Surgeon: Gerald Truesdale, MD;  Location: [facility];  Service: Plastics;  Laterality: Bilateral;  . CARPAL TUNNEL RELEASE     L wrist, ulnar nerve moved  . CHOLECYSTECTOMY    . CRANIOTOMY FOR TUMOR  2000  . ELBOW SURGERY     left  . KNEE ARTHROSCOPY Left 10/12/2014   Procedure: LEFT KNEE ARTHROSCOPY ;  Surgeon: Frank Aluisio, MD;  Location: [facility];  Service: Orthopedics;  Laterality: Left;  . KYPHOPLASTY N/A 03/30/2016   Procedure: THORACIC 12 KYPHOPLASTY;  Surgeon: Mark Dumonski, MD;  Location: [facility];  Service: Orthopedics;  Laterality: N/A;  THORACIC 12 KYPHOPLASTY  . LEFT HEART CATHETERIZATION WITH CORONARY ANGIOGRAM N/A 12/16/2013   Procedure: LEFT HEART CATHETERIZATION WITH CORONARY ANGIOGRAM;  Surgeon: Peter M Jordan, MD;  Location: [facility];  Service: Cardiovascular;  Laterality: N/A;  . LIPOMA EXCISION  03/28/2009   right leg  . MELANOMA EXCISION    . PORT-A-CATH REMOVAL  11/30/2010   Streck  . porta cath    . PORTACATH PLACEMENT  may 2011  . SYNOVECTOMY Left 10/12/2014   Procedure: WITH SYNOVECTOMY;  Surgeon: Frank  Aluisio, MD;  Location: [facility];  Service: Orthopedics;  Laterality: Left;  . TONSILLECTOMY  1958  . TOTAL KNEE ARTHROPLASTY  02/05/2012   Procedure: TOTAL KNEE ARTHROPLASTY;  Surgeon: Frank V Aluisio, MD;  Location: [facility];  Service: Orthopedics;  Laterality: Right;  . TOTAL KNEE ARTHROPLASTY Left 02/10/2013   Procedure: LEFT TOTAL KNEE ARTHROPLASTY;  Surgeon: Frank V Aluisio, MD;  Location: [facility];  Service: Orthopedics;  Laterality: Left;  . total knee raplacement  01-2012   Right Knee  . TUBAL LIGATION  1997     ALLERGIES:  Allergies  Allergen Reactions  . Dilaudid [Hydromorphone Hcl] Itching     CURRENT MEDICATIONS:  Outpatient Encounter Prescriptions as of 08/07/2016  Medication Sig  . acetaminophen (TYLENOL) 500 MG tablet Take 500-1,000 mg by mouth every 6 (  six) hours as needed for moderate pain or headache.   . albuterol (PROVENTIL HFA;VENTOLIN HFA) 108 (90 Base) MCG/ACT inhaler Inhale 2 puffs into the lungs every 6 (six) hours as needed for wheezing or shortness of breath.  . carvedilol (COREG) 25 MG tablet Take 0.5 tablets (12.5 mg total) by mouth 2 (two) times daily with a meal. Hold if your blood pressure is <120/80  . carvedilol (COREG) 25 MG tablet TAKE 1 TABLET BY MOUTH TWICE A DAY WITH A MEAL  . clonazePAM (KLONOPIN) 1 MG tablet TAKE 1/2-1 TABLET BY MOUTH AT BEDTIME  . ferrous sulfate 325 (65 FE) MG tablet Take 325 mg by mouth daily with breakfast.  . folic acid (FOLVITE) 1 MG tablet TAKE ONE TABLET (1 MG TOTAL) BY MOUTH DAILY. (Patient taking differently: Take 1 mg by mouth daily. )  . gabapentin (NEURONTIN) 300 MG capsule Take 1 capsule (300 mg total) by mouth 2 (two) times daily. Reported on 05/24/2015  . hydrOXYzine (ATARAX/VISTARIL) 25 MG tablet Take 25 mg by mouth at bedtime.  . Multiple Vitamin (MULTIVITAMIN WITH MINERALS) TABS tablet Take 1 tablet by mouth daily.  . omeprazole (PRILOSEC) 20 MG capsule Take 20 mg by mouth daily as needed (heartburn).  . pentosan  polysulfate (ELMIRON) 100 MG capsule Take 100 mg by mouth 2 (two) times daily.   . ranitidine (ZANTAC) 150 MG tablet TAKE 1 TABLET (150 MG TOTAL) BY MOUTH 2 (TWO) TIMES DAILY AS NEEDED FOR HEARTBURN.  . rosuvastatin (CRESTOR) 20 MG tablet TAKE 1 TABLET (20 MG TOTAL) BY MOUTH AT BEDTIME. (Patient taking differently: Take 20 mg by mouth at bedtime. )  . telmisartan (MICARDIS) 80 MG tablet Take 0.5 tablets (40 mg total) by mouth daily. Hold if your blood pressure is <120/80 (Patient taking differently: Take 80 mg by mouth daily. Hold if your blood pressure is <120/80)  . venlafaxine XR (EFFEXOR-XR) 150 MG 24 hr capsule Take 1 capsule (150 mg total) by mouth daily.  . B Complex-C (SUPER B COMPLEX PO) Take 1 tablet by mouth daily.  . OVER THE COUNTER MEDICATION Place 1 drop into both eyes 2 (two) times daily as needed (dry eyes/ irritation). Over the counter lubricating eye drop  . Probiotic Product (PROBIOTIC DAILY PO) Take 1 capsule by mouth daily.   No facility-administered encounter medications on file as of 08/07/2016.      ONCOLOGIC FAMILY HISTORY:  Family History  Problem Relation Age of Onset  . Breast cancer Mother        sarcoma  . Lung cancer Mother   . Hypertension Mother   . Prostate cancer Father   . Congestive Heart Failure Father   . Heart attack Father   . Prostate cancer Brother   . Down syndrome Son   . Heart disease Maternal Grandfather        MI  . Stomach cancer Maternal Aunt   . Uterine cancer Maternal Aunt   . Colon cancer Neg Hx       SOCIAL HISTORY:  Neisha D Plasse is married and lives with her husband and their 49 year old down syndrome son in [place], [place].  Her older son lives in [place], [place], and her daughter lives in [place], [place].  Ms. Vetrano is currently retired.  She denies any current or history of tobacco, alcohol, or illicit drug use.     PHYSICAL EXAMINATION:  Vital Signs: Vitals:   08/07/16 1053  BP: (!) 138/53  Pulse: 75    Resp:  16  Temp: 98.1 F (36.7 C)   Filed Weights   08/07/16 1053  Weight: 196 lb (88.9 kg)   General: Well-nourished, well-appearing female in no acute distress.  Unaccompanied today.   HEENT: Head is normocephalic.  Pupils equal and reactive to light. Conjunctivae clear without exudate.  Sclerae anicteric. Oral mucosa is pink, moist.  Oropharynx is pink without lesions or erythema.  Lymph: No cervical, supraclavicular, or infraclavicular lymphadenopathy noted on palpation.  Cardiovascular: Regular rate and rhythm.Marland Kitchen Respiratory: Clear to auscultation bilaterally. Chest expansion symmetric; breathing non-labored.  Breast Exam:  -Left breast: s/p mammoplasty, No appreciable masses on palpation. No skin redness, thickening, or peau d'orange appearance; no nipple retraction or nipple discharge; well healed scars without erythema or nodularity.  -Right breast: No appreciable masses on palpation. No skin redness, thickening, or peau d'orange appearance; no nipple retraction or nipple discharge;well healed scars, some thickness at lower scar line with erythema -Axilla: No axillary adenopathy bilaterally.  GI: Abdomen soft and round; non-tender, non-distended. Bowel sounds normoactive. No hepatosplenomegaly.   GU: Deferred.  Neuro: No focal deficits. Steady gait.  Psych: Mood and affect normal and appropriate for situation.  MSK: No focal spinal tenderness to palpation, full range of motion in bilateral upper extremities Extremities: No edema. Skin: Warm and dry.  LABORATORY DATA:  Appointment on 08/07/2016  Component Date Value Ref Range Status  . WBC 08/07/2016 5.7  3.9 - 10.3 10e3/uL Final  . NEUT# 08/07/2016 3.2  1.5 - 6.5 10e3/uL Final  . HGB 08/07/2016 11.9  11.6 - 15.9 g/dL Final  . HCT 08/07/2016 37.7  34.8 - 46.6 % Final  . Platelets 08/07/2016 177  145 - 400 10e3/uL Final  . MCV 08/07/2016 90.8  79.5 - 101.0 fL Final  . MCH 08/07/2016 28.7  25.1 - 34.0 pg Final  . MCHC 08/07/2016 31.6   31.5 - 36.0 g/dL Final  . RBC 08/07/2016 4.15  3.70 - 5.45 10e6/uL Final  . RDW 08/07/2016 14.6* 11.2 - 14.5 % Final  . lymph# 08/07/2016 1.4  0.9 - 3.3 10e3/uL Final  . MONO# 08/07/2016 0.5  0.1 - 0.9 10e3/uL Final  . Eosinophils Absolute 08/07/2016 0.5  0.0 - 0.5 10e3/uL Final  . Basophils Absolute 08/07/2016 0.0  0.0 - 0.1 10e3/uL Final  . NEUT% 08/07/2016 56.1  38.4 - 76.8 % Final  . LYMPH% 08/07/2016 25.3  14.0 - 49.7 % Final  . MONO% 08/07/2016 8.8  0.0 - 14.0 % Final  . EOS% 08/07/2016 9.4* 0.0 - 7.0 % Final  . BASO% 08/07/2016 0.4  0.0 - 2.0 % Final  . Sodium 08/07/2016 144  136 - 145 mEq/L Final  . Potassium 08/07/2016 3.9  3.5 - 5.1 mEq/L Final  . Chloride 08/07/2016 107  98 - 109 mEq/L Final  . CO2 08/07/2016 30* 22 - 29 mEq/L Final  . Glucose 08/07/2016 105  70 - 140 mg/dl Final   Glucose reference range is for nonfasting patients. Fasting glucose reference range is 70- 100.  Marland Kitchen BUN 08/07/2016 10.3  7.0 - 26.0 mg/dL Final  . Creatinine 08/07/2016 0.8  0.6 - 1.1 mg/dL Final  . Total Bilirubin 08/07/2016 0.36  0.20 - 1.20 mg/dL Final  . Alkaline Phosphatase 08/07/2016 107  40 - 150 U/L Final  . AST 08/07/2016 25  5 - 34 U/L Final  . ALT 08/07/2016 26  0 - 55 U/L Final  . Total Protein 08/07/2016 6.5  6.4 - 8.3 g/dL Final  .  Albumin 08/07/2016 3.9  3.5 - 5.0 g/dL Final  . Calcium 08/07/2016 9.4  8.4 - 10.4 mg/dL Final  . Anion Gap 08/07/2016 7  3 - 11 mEq/L Final  . EGFR 08/07/2016 72* >90 ml/min/1.73 m2 Final   eGFR is calculated using the CKD-EPI Creatinine Equation (2009)     DIAGNOSTIC IMAGING:  Most recent mammogram:      ASSESSMENT AND PLAN:  Ms.. Laughman is a pleasant 71 y.o. female with history of Stage IA right breast invasive ductal carcinoma, ER-/PR-/HER2+, diagnosed in 04/2009, treated with neoadjuvant chemotherapy, lumpectomy, adjuvant radiation therapy, and maintenance trastuzumab.  She presents to the Survivorship [facility] for surveillance and routine  follow-up.   1. History of breast cancer:  Ms. Holland is currently clinically and radiographically without evidence of disease or recurrence of breast cancer. She will be due for mammogram this month; orders placed today.    I encouraged her to call me with any questions or concerns before her next visit at the cancer center, and I would be happy to see her sooner, if needed.    2. Bone health:  Given Ms. Mcclees's age, history of breast cancer she is at risk for bone demineralization.  She was given education on specific food and activities to promote bone health.  I will defer to her PCP regarding bone density testing and management.  3. Cancer screening:  Due to Ms. Marrocco's history and her age, she should receive screening for skin cancers, colon cancer cancers. She was encouraged to follow-up with her PCP for appropriate cancer screenings.   4. Health maintenance and wellness promotion: Ms. Lehr was encouraged to consume 5-7 servings of fruits and vegetables per day. She was also encouraged to engage in moderate to vigorous exercise for 30 minutes per day most days of the week. She was instructed to limit her alcohol consumption and continue to abstain from tobacco use.  5. Cardiac issues: Her Trastuzumab was discontinued early due to a drop in her LVEF.  She continues to follow with cardiology and her PCP and has routine cholesterol checks.  She denies any new issues related to this.      Dispo:  -Return to cancer center in one year for LTS follow up -Mammogram ASAP at the [facility]   A total of (30) minutes of face-to-face time was spent with this patient with greater than 50% of that time in counseling and care-coordination.    Cornetto , NP Survivorship Program [facility] [phone]   Note: PRIMARY CARE PROVIDER Blyth, Stacey A, MD [phone]  

## 2016-08-11 ENCOUNTER — Other Ambulatory Visit: Payer: Medicare Other

## 2016-08-12 ENCOUNTER — Other Ambulatory Visit: Payer: Self-pay | Admitting: Family Medicine

## 2016-08-14 ENCOUNTER — Ambulatory Visit (INDEPENDENT_AMBULATORY_CARE_PROVIDER_SITE_OTHER): Payer: Medicare Other | Admitting: Family Medicine

## 2016-08-14 DIAGNOSIS — F418 Other specified anxiety disorders: Secondary | ICD-10-CM | POA: Diagnosis not present

## 2016-08-14 DIAGNOSIS — E669 Obesity, unspecified: Secondary | ICD-10-CM

## 2016-08-14 DIAGNOSIS — I10 Essential (primary) hypertension: Secondary | ICD-10-CM

## 2016-08-14 DIAGNOSIS — M545 Low back pain: Secondary | ICD-10-CM | POA: Diagnosis not present

## 2016-08-14 MED ORDER — CLONAZEPAM 0.5 MG PO TABS: 0.2500 mg | ORAL_TABLET | Freq: Three times a day (TID) | ORAL | 2 refills | Status: DC | PRN

## 2016-08-14 NOTE — Assessment & Plan Note (Signed)
Very stressed caring for her adult son with disabilities after he had a major stroke. He now requires constant care. He is on PEG tube feeding but he recent took out his PEG tube accidentally and she had to take him to the hospitalization. He is not sleeping well at night. She will stay on Venlafaxine and can continue Clonazepam but can add am Clonazepam as needed

## 2016-08-14 NOTE — Assessment & Plan Note (Signed)
[facility] has had kyphoplasty previously. Now having pain lower from lifting her disabled son. Add Tylenol ES 1-2 tabs po bid, continue Aleve bid, add lidocaine gel or patches as needed. If continues follow up with ortho

## 2016-08-14 NOTE — Telephone Encounter (Signed)
Last office visit on 04/04/16 Last refill on 12/28/2015   #[phone] with 1 refill,

## 2016-08-14 NOTE — Patient Instructions (Signed)
Tylenol ES 1-2 tabs po bid, continue Aleve bid, add lidocaine gel or patches as needed. If continues follow up with ortho Ice and/or heat Back Pain, Adult Back pain is very common in adults.The cause of back pain is rarely dangerous and the pain often gets better over time.The cause of your back pain may not be known. Some common causes of back pain include:  Strain of the muscles or ligaments supporting the spine.  Wear and tear (degeneration) of the spinal disks.  Arthritis.  Direct injury to the back.  For many people, back pain may return. Since back pain is rarely dangerous, most people can learn to manage this condition on their own. Follow these instructions at home: Watch your back pain for any changes. The following actions may help to lessen any discomfort you are feeling:  Remain active. It is stressful on your back to sit or stand in one place for long periods of time. Do not sit, drive, or stand in one place for more than 30 minutes at a time. Take short walks on even surfaces as soon as you are able.Try to increase the length of time you walk each day.  Exercise regularly as directed by your health care provider. Exercise helps your back heal faster. It also helps avoid future injury by keeping your muscles strong and flexible.  Do not stay in bed.Resting more than 1-2 days can delay your recovery.  Pay attention to your body when you bend and lift. The most comfortable positions are those that put less stress on your recovering back. Always use proper lifting techniques, including: ? Bending your knees. ? Keeping the load close to your body. ? Avoiding twisting.  Find a comfortable position to sleep. Use a firm mattress and lie on your side with your knees slightly bent. If you lie on your back, put a pillow under your knees.  Avoid feeling anxious or stressed.Stress increases muscle tension and can worsen back pain.It is important to recognize when you are anxious  or stressed and learn ways to manage it, such as with exercise.  Take medicines only as directed by your health care provider. Over-the-counter medicines to reduce pain and inflammation are often the most helpful.Your health care provider may prescribe muscle relaxant drugs.These medicines help dull your pain so you can more quickly return to your normal activities and healthy exercise.  Apply ice to the injured area: ? Put ice in a plastic bag. ? Place a towel between your skin and the bag. ? Leave the ice on for 20 minutes, 2-3 times a day for the first 2-3 days. After that, ice and heat may be alternated to reduce pain and spasms.  Maintain a healthy weight. Excess weight puts extra stress on your back and makes it difficult to maintain good posture.  Contact a health care provider if:  You have pain that is not relieved with rest or medicine.  You have increasing pain going down into the legs or buttocks.  You have pain that does not improve in one week.  You have night pain.  You lose weight.  You have a fever or chills. Get help right away if:  You develop new bowel or bladder control problems.  You have unusual weakness or numbness in your arms or legs.  You develop nausea or vomiting.  You develop abdominal pain.  You feel faint. This information is not intended to replace advice given to you by your health care provider. Make sure  you discuss any questions you have with your health care provider. Document Released: 01/16/2005 Document Revised: 05/27/2015 Document Reviewed: 05/20/2013 Elsevier Interactive Patient Education  2017 [employer].

## 2016-08-14 NOTE — Assessment & Plan Note (Signed)
Encouraged DASH diet, decrease po intake and increase exercise as tolerated. Needs 7-8 hours of sleep nightly. Avoid trans fats, eat small, frequent meals every 4-5 hours with lean proteins, complex carbs and healthy fats. Minimize simple carbs, consider bariatric referral 

## 2016-08-14 NOTE — Assessment & Plan Note (Signed)
Well controlled, no changes to meds. Encouraged heart healthy diet such as the DASH diet and exercise as tolerated.  °

## 2016-08-14 NOTE — Progress Notes (Signed)
Subjective:  I acted as a Education administrator for Dr. Charlett Blake. [name], [place]  Patient ID: Shannon [name], female    DOB: 1945/07/31, 71 y.o.   MRN: [id number]  No chief complaint on file.   HPI  Patient is in today for follow up. Patient states she is really stressed out due to her son Who has significant disabilities recently having a stroke. He is now back home with need of 24 7 care. He is not sleeping at night as a result she is not getting rest. She has some help during the day but not at night. She is not sleeping well nor is she exercising or eating well. She has not tried taking clonazepam during the day but it does help her sleep at night. No new febrile illness or acute concerns are otherwise noted. She struggles with anhedonia but denies suicidal ideation. Denies CP/palp/SOB/HA/congestion/fevers/GI or GU c/o. Taking meds as prescribed  Patient Care Team: Mosie Lukes, MD as PCP - General (Family Medicine) Domingo Pulse, MD as Consulting Physician (Urology) Calvert Cantor, MD as Consulting Physician (Ophthalmology) Stanford Breed Denice Bors, MD as Consulting Physician (Cardiology)   Past Medical History:  Diagnosis Date  . 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 Anemia 08/28/2011  . Asthma    as a child  . Brain tumor ([place])   . Breast cancer ([place])    right breast  . Carpal tunnel syndrome of left wrist   . Cataracts, bilateral 12/28/2015  . Constipation 03/03/2012  . DEPRESSION 08/11/2008  . Dermatitis   . Facial fracture ([place])   . GERD 08/11/2008  . H/O measles   . H/O mumps   . History of blood transfusion   . History of chicken pox   . History of hiatal hernia   . 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 HYPERLIPIDEMIA 08/11/2008  . HYPERTENSION 08/11/2008  . Hyponatremia 08/28/2011  . 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 Interstitial cystitis   . LIPOMA 02/10/2009  . Medicare annual wellness visit,  subsequent 12/27/2014   Follows with Dr Amalia Hailey of urology Follows with Dr Posey Pronto at [facility] for opthamology Follows with Dr [name] of dermatology Follows with [facility] gastroenterology, last scope done in 2011 repeat in 10 years Last [facility] July 2015 should repeat every 1-2 years Last Pap December 2015, repeat in 3 years.   . Melanoma ([place]) 2008   DR. Ronnald Ramp  . Neuropathy   . NICM (nonischemic cardiomyopathy) ([place]) 03/08/2010   likely 2/2 chemotx - a. Echo 2012: EF 45-50%;  b. Lex MV 2/12:  low risk, apical defect (small area of ischemia vs shifting breast atten);  c.  Echo 7/12: Normal wall thickness, EF 60-65%, normal wall motion, grade 1 diastolic dysfunction, mild LAE, PASP 32;   d. Lex MV 11/13:  EF 76%, no ischemia  . OA (osteoarthritis) 09/07/2011  . Obesity 09/28/2014  . Osteoporosis   . PREDIABETES 06/15/2009  . Recurrent falls 12/28/2015  . Shortness of breath   . UTI (lower urinary tract infection) 03/03/2012  . Vertebral fracture, osteoporotic, sequela 04/05/2016    Past Surgical History:  Procedure Laterality Date  . BREAST LUMPECTOMY  04/2009   RIGHT FOR BREAST CANCER-CHEMO/RADIATION X 1 YEAR  . BREAST REDUCTION SURGERY Bilateral 05/04/2014   Procedure: MAMMARY REDUCTION  (BREAST);  Surgeon: Cristine Polio, MD;  Location: [facility];  Service:  Plastics;  Laterality: Bilateral;  . CARPAL TUNNEL RELEASE     L wrist, ulnar nerve moved  . CHOLECYSTECTOMY    . CRANIOTOMY FOR TUMOR  2000  . ELBOW SURGERY     left  . KNEE ARTHROSCOPY Left 10/12/2014   Procedure: LEFT KNEE ARTHROSCOPY ;  Surgeon: Gaynelle Arabian, MD;  Location: [facility];  Service: Orthopedics;  Laterality: Left;  . KYPHOPLASTY N/A 03/30/2016   Procedure: THORACIC 12 KYPHOPLASTY;  Surgeon: Phylliss Bob, MD;  Location: [facility];  Service: Orthopedics;  Laterality: N/A;  THORACIC 12 KYPHOPLASTY  . LEFT HEART CATHETERIZATION WITH CORONARY ANGIOGRAM N/A 12/16/2013   Procedure: LEFT HEART CATHETERIZATION WITH CORONARY ANGIOGRAM;   Surgeon: Peter M [name], MD;  Location: [facility] CATH LAB;  Service: Cardiovascular;  Laterality: N/A;  . LIPOMA EXCISION  03/28/2009   right leg  . MELANOMA EXCISION    . PORT-A-CATH REMOVAL  11/30/2010   [place]  . porta cath    . PORTACATH PLACEMENT  may 2011  . SYNOVECTOMY Left 10/12/2014   Procedure: WITH SYNOVECTOMY;  Surgeon: Gaynelle Arabian, MD;  Location: [facility];  Service: Orthopedics;  Laterality: Left;  . TONSILLECTOMY  1958  . TOTAL KNEE ARTHROPLASTY  02/05/2012   Procedure: TOTAL KNEE ARTHROPLASTY;  Surgeon: Gearlean Alf, MD;  Location: [facility];  Service: Orthopedics;  Laterality: Right;  . TOTAL KNEE ARTHROPLASTY Left 02/10/2013   Procedure: LEFT TOTAL KNEE ARTHROPLASTY;  Surgeon: Gearlean Alf, MD;  Location: [facility];  Service: Orthopedics;  Laterality: Left;  . total knee raplacement  01-2012   Right Knee  . TUBAL LIGATION  1997    Family History  Problem Relation Age of Onset  . Breast cancer Mother        sarcoma  . Lung cancer Mother   . Hypertension Mother   . Prostate cancer Father   . Congestive Heart Failure Father   . Heart attack Father   . Prostate cancer Brother   . Down syndrome Son   . Heart disease Maternal Grandfather        MI  . Stomach cancer Maternal Aunt   . Uterine cancer Maternal Aunt   . Colon cancer Neg Hx     Social History   Social History  . Marital status: Married    Spouse name: N/A  . Number of children: 3  . Years of education: N/A   Occupational History  . boutique owner [employer]   Social History Main Topics  . Smoking status: Never Smoker  . Smokeless tobacco: Never Used  . Alcohol use No  . Drug use: No  . Sexual activity: No   Other Topics Concern  . Not on file   Social History Narrative  . No narrative on file    Outpatient Medications Prior to Visit  Medication Sig Dispense Refill  . acetaminophen (TYLENOL) 500 MG tablet Take 500-1,000 mg by mouth every 6 (six) hours as needed for moderate pain or  headache.     . albuterol (PROVENTIL HFA;VENTOLIN HFA) 108 (90 Base) MCG/ACT inhaler Inhale 2 puffs into the lungs every 6 (six) hours as needed for wheezing or shortness of breath. 1 Inhaler 0  . B Complex-C (SUPER B COMPLEX PO) Take 1 tablet by mouth daily.    . carvedilol (COREG) 25 MG tablet Take 0.5 tablets (12.5 mg total) by mouth 2 (two) times daily with a meal. Hold if your blood pressure is <120/80 30 tablet 0  . ferrous sulfate 325 (  65 FE) MG tablet Take 325 mg by mouth daily with breakfast.    . folic acid (FOLVITE) 1 MG tablet TAKE ONE TABLET (1 MG TOTAL) BY MOUTH DAILY. (Patient taking differently: Take 1 mg by mouth daily. ) 90 tablet 3  . hydrOXYzine (ATARAX/VISTARIL) 25 MG tablet Take 25 mg by mouth at bedtime.    . Multiple Vitamin (MULTIVITAMIN WITH MINERALS) TABS tablet Take 1 tablet by mouth daily.    Marland Kitchen nystatin (MYCOSTATIN/NYSTOP) powder Apply topically 4 (four) times daily. 15 g 0  . omeprazole (PRILOSEC) 20 MG capsule Take 20 mg by mouth daily as needed (heartburn).    Marland Kitchen OVER THE COUNTER MEDICATION Place 1 drop into both eyes 2 (two) times daily as needed (dry eyes/ irritation). Over the counter lubricating eye drop    . pentosan polysulfate (ELMIRON) 100 MG capsule Take 100 mg by mouth 2 (two) times daily.     . ranitidine (ZANTAC) 150 MG tablet TAKE 1 TABLET (150 MG TOTAL) BY MOUTH 2 (TWO) TIMES DAILY AS NEEDED FOR HEARTBURN. 180 tablet 1  . rosuvastatin (CRESTOR) 20 MG tablet TAKE 1 TABLET (20 MG TOTAL) BY MOUTH AT BEDTIME. (Patient taking differently: Take 20 mg by mouth at bedtime. ) 90 tablet 3  . telmisartan (MICARDIS) 80 MG tablet Take 0.5 tablets (40 mg total) by mouth daily. Hold if your blood pressure is <120/80 (Patient taking differently: Take 80 mg by mouth daily. Hold if your blood pressure is <120/80) 30 tablet 0  . venlafaxine XR (EFFEXOR-XR) 150 MG 24 hr capsule Take 1 capsule (150 mg total) by mouth daily. 90 capsule 1  . clonazePAM (KLONOPIN) 1 MG tablet  TAKE 1/2-1 TABLET BY MOUTH AT BEDTIME 30 tablet 1  . gabapentin (NEURONTIN) 300 MG capsule Take 1 capsule (300 mg total) by mouth 2 (two) times daily. Reported on 05/24/2015 180 capsule 1  . Probiotic Product (PROBIOTIC DAILY PO) Take 1 capsule by mouth daily.    . carvedilol (COREG) 25 MG tablet TAKE 1 TABLET BY MOUTH TWICE A DAY WITH A MEAL 180 tablet 1   No facility-administered medications prior to visit.     Allergies  Allergen Reactions  . Dilaudid [Hydromorphone Hcl] Itching    Review of Systems  Constitutional: Positive for malaise/fatigue. Negative for fever.  HENT: Negative for congestion.   Eyes: Negative for blurred vision.  Respiratory: Negative for cough and shortness of breath.   Cardiovascular: Negative for chest pain, palpitations and leg swelling.  Gastrointestinal: Negative for vomiting.  Musculoskeletal: Positive for back pain. Negative for joint pain.  Skin: Negative for rash.  Neurological: Negative for loss of consciousness and headaches.  Psychiatric/Behavioral: Positive for depression. The patient is nervous/anxious and has insomnia.        Objective:    Physical Exam  Constitutional: She is oriented to person, place, and time. She appears well-developed and well-nourished. No distress.  HENT:  Head: Normocephalic and atraumatic.  Eyes: Conjunctivae are normal.  Neck: Normal range of motion. No thyromegaly present.  Cardiovascular: Normal rate and regular rhythm.   Pulmonary/Chest: Effort normal and breath sounds normal. She has no wheezes.  Abdominal: Soft. Bowel sounds are normal. There is no tenderness.  Musculoskeletal: Normal range of motion. She exhibits no edema or deformity.  Neurological: She is alert and oriented to person, place, and time.  Skin: Skin is warm and dry. She is not diaphoretic.  Psychiatric: She has a normal mood and affect.    BP 118/78 (BP Location:  Left Arm, Patient Position: Sitting, Cuff Size: Normal)   Pulse 69    Temp 97.8 F (36.6 C) (Oral)   Resp 18   Wt 196 lb 12.8 oz (89.3 kg)   LMP 01/30/1994   SpO2 98%   BMI 33.78 kg/m  Wt Readings from Last 3 Encounters:  08/14/16 196 lb 12.8 oz (89.3 kg)  08/07/16 196 lb (88.9 kg)  04/04/16 211 lb 3.2 oz (95.8 kg)   BP Readings from Last 3 Encounters:  08/14/16 118/78  08/07/16 (!) 138/53  04/04/16 118/80     Immunization History  Administered Date(s) Administered  . Influenza Split 11/11/2010, 11/14/2011  . Influenza Whole 02/10/2009  . Influenza, High Dose Seasonal PF 11/04/2015  . Influenza,inj,Quad PF,36+ Mos 10/18/2012, 10/10/2013, 12/21/2014  . Pneumococcal Conjugate-13 12/21/2014  . Pneumococcal Polysaccharide-23 09/04/2011  . Td 01/30/2001, 12/28/2015    Health Maintenance  Topic Date Due  . INFLUENZA VACCINE  08/30/2016  . MAMMOGRAM  05/05/2017  . COLONOSCOPY  05/19/2019  . TETANUS/TDAP  12/27/2025  . DEXA SCAN  Completed  . Hepatitis C Screening  Completed  . PNA vac Low Risk Adult  Completed    Lab Results  Component Value Date   WBC 5.7 08/07/2016   HGB 11.9 08/07/2016   HCT 37.7 08/07/2016   PLT 177 08/07/2016   GLUCOSE 105 08/07/2016   CHOL 176 11/04/2015   TRIG 264.0 (H) 11/04/2015   HDL 45.70 11/04/2015   LDLDIRECT 98.0 11/04/2015   LDLCALC 120 (H) 10/10/2013   ALT 26 08/07/2016   AST 25 08/07/2016   NA 144 08/07/2016   K 3.9 08/07/2016   CL 108 03/30/2016   CREATININE 0.8 08/07/2016   BUN 10.3 08/07/2016   CO2 30 (H) 08/07/2016   TSH 2.06 11/04/2015   INR 1.06 03/30/2016   HGBA1C 5.9 11/04/2015    Lab Results  Component Value Date   TSH 2.06 11/04/2015   Lab Results  Component Value Date   WBC 5.7 08/07/2016   HGB 11.9 08/07/2016   HCT 37.7 08/07/2016   MCV 90.8 08/07/2016   PLT 177 08/07/2016   Lab Results  Component Value Date   NA 144 08/07/2016   K 3.9 08/07/2016   CHLORIDE 107 08/07/2016   CO2 30 (H) 08/07/2016   GLUCOSE 105 08/07/2016   BUN 10.3 08/07/2016   CREATININE 0.8  08/07/2016   BILITOT 0.36 08/07/2016   ALKPHOS 107 08/07/2016   AST 25 08/07/2016   ALT 26 08/07/2016   PROT 6.5 08/07/2016   ALBUMIN 3.9 08/07/2016   CALCIUM 9.4 08/07/2016   ANIONGAP 7 08/07/2016   EGFR 72 (L) 08/07/2016   GFR 69.99 11/04/2015   Lab Results  Component Value Date   CHOL 176 11/04/2015   Lab Results  Component Value Date   HDL 45.70 11/04/2015   Lab Results  Component Value Date   LDLCALC 120 (H) 10/10/2013   Lab Results  Component Value Date   TRIG 264.0 (H) 11/04/2015   Lab Results  Component Value Date   CHOLHDL 4 11/04/2015   Lab Results  Component Value Date   HGBA1C 5.9 11/04/2015         Assessment & Plan:   Problem List Items Addressed This Visit    Depression with anxiety    Very stressed caring for her adult son with disabilities after he had a major stroke. He now requires constant care. He is on PEG tube feeding but he recent took out his PEG tube  accidentally and she had to take him to the hospitalization. He is not sleeping well at night. She will stay on Venlafaxine and can continue Clonazepam but can add am Clonazepam as needed      Essential hypertension    Well controlled, no changes to meds. Encouraged heart healthy diet such as the DASH diet and exercise as tolerated.       Obesity    Encouraged DASH diet, decrease po intake and increase exercise as tolerated. Needs 7-8 hours of sleep nightly. Avoid trans fats, eat small, frequent meals every 4-5 hours with lean proteins, complex carbs and healthy fats. Minimize simple carbs, consider bariatric referral      Low back pain    [facility] has had kyphoplasty previously. Now having pain lower from lifting her disabled son. Add Tylenol ES 1-2 tabs po bid, continue Aleve bid, add lidocaine gel or patches as needed. If continues follow up with ortho         I have discontinued Ms. Woolever's Probiotic Product (PROBIOTIC DAILY PO) and clonazePAM. I am also having her start on  clonazePAM. Additionally, I am having her maintain her hydrOXYzine, pentosan polysulfate, B Complex-C (SUPER B COMPLEX PO), acetaminophen, rosuvastatin, folic acid, ferrous sulfate, multivitamin with minerals, OVER THE COUNTER MEDICATION, omeprazole, carvedilol, telmisartan, ranitidine, venlafaxine XR, albuterol, and nystatin.  Meds ordered this encounter  Medications  . clonazePAM (KLONOPIN) 0.5 MG tablet    Sig: Take 0.5-1 tablets (0.25-0.5 mg total) by mouth 3 (three) times daily as needed for anxiety.    Dispense:  60 tablet    Refill:  2    CMA served as scribe during this visit. History, Physical and Plan performed by medical provider. Documentation and orders reviewed and attested to.  Penni Homans, MD

## 2016-08-15 DIAGNOSIS — H53489 Generalized contraction of visual field, unspecified eye: Secondary | ICD-10-CM | POA: Diagnosis not present

## 2016-08-15 DIAGNOSIS — H02423 Myogenic ptosis of bilateral eyelids: Secondary | ICD-10-CM | POA: Diagnosis not present

## 2016-08-15 DIAGNOSIS — H02834 Dermatochalasis of left upper eyelid: Secondary | ICD-10-CM | POA: Diagnosis not present

## 2016-08-15 DIAGNOSIS — H02831 Dermatochalasis of right upper eyelid: Secondary | ICD-10-CM | POA: Diagnosis not present

## 2016-08-15 DIAGNOSIS — H02413 Mechanical ptosis of bilateral eyelids: Secondary | ICD-10-CM | POA: Diagnosis not present

## 2016-08-16 ENCOUNTER — Ambulatory Visit
Admission: RE | Admit: 2016-08-16 | Discharge: 2016-08-16 | Disposition: A | Discharge status: Home / Self Care | Payer: Medicare Other | Source: Ambulatory Visit | Attending: Adult Health | Admitting: Adult Health

## 2016-08-16 ENCOUNTER — Other Ambulatory Visit: Payer: Self-pay | Admitting: Adult Health

## 2016-08-16 DIAGNOSIS — Z171 Estrogen receptor negative status [ER-]: Principal | ICD-10-CM

## 2016-08-16 DIAGNOSIS — N6489 Other specified disorders of breast: Secondary | ICD-10-CM | POA: Diagnosis not present

## 2016-08-16 DIAGNOSIS — N641 Fat necrosis of breast: Secondary | ICD-10-CM

## 2016-08-16 DIAGNOSIS — C50311 Malignant neoplasm of lower-inner quadrant of right female breast: Secondary | ICD-10-CM

## 2016-08-16 DIAGNOSIS — R928 Other abnormal and inconclusive findings on diagnostic imaging of breast: Secondary | ICD-10-CM | POA: Diagnosis not present

## 2016-08-16 HISTORY — DX: Personal history of antineoplastic chemotherapy: Z92.21

## 2016-08-16 HISTORY — DX: Personal history of irradiation: Z92.3

## 2016-08-17 ENCOUNTER — Telehealth: Payer: Self-pay | Admitting: Family Medicine

## 2016-08-17 NOTE — Telephone Encounter (Signed)
Pt dropped off document to be filled out [employer] -it is for [place] but at [facility] stated needed to have document under her name). Pt is needing it ASAP, please call at [phone] or [phone]. Document put at front office tray.

## 2016-08-18 NOTE — Telephone Encounter (Signed)
Received Application and Renewal of Handicapped Drivers Registration Plate [place] [employer]; forward to provider/SLS 07/20

## 2016-08-24 NOTE — Telephone Encounter (Signed)
Provider will return to office tomorrow, 08/25/16/SLS 07/26

## 2016-08-24 NOTE — Telephone Encounter (Signed)
Pt called in to follow up on Handicap Renewal Application. She would like a call back when ready for pick up.     CB: [id number]

## 2016-08-25 ENCOUNTER — Encounter (HOSPITAL_COMMUNITY): Payer: Self-pay

## 2016-08-28 NOTE — Telephone Encounter (Signed)
Patient informed ready for pick-up, understood & agreed/SLS 07/30

## 2016-09-07 DIAGNOSIS — R8290 Unspecified abnormal findings in urine: Secondary | ICD-10-CM | POA: Diagnosis not present

## 2016-10-12 ENCOUNTER — Other Ambulatory Visit: Payer: Self-pay | Admitting: Family Medicine

## 2016-10-29 ENCOUNTER — Other Ambulatory Visit: Payer: Self-pay | Admitting: Family Medicine

## 2016-11-08 ENCOUNTER — Other Ambulatory Visit: Payer: Self-pay | Admitting: Family Medicine

## 2016-12-05 ENCOUNTER — Other Ambulatory Visit: Payer: Self-pay | Admitting: Family Medicine

## 2016-12-25 ENCOUNTER — Other Ambulatory Visit: Payer: Self-pay | Admitting: Family Medicine

## 2016-12-26 ENCOUNTER — Other Ambulatory Visit: Payer: Self-pay | Admitting: Family Medicine

## 2016-12-27 NOTE — Telephone Encounter (Signed)
Received carvedilol 25mg  twice a day request. Current med list says pt is taking 12.5mg  twice a day.  Left message for pt to return call to verify how she takes medication.

## 2017-01-02 ENCOUNTER — Other Ambulatory Visit: Payer: Self-pay | Admitting: Family Medicine

## 2017-01-02 ENCOUNTER — Other Ambulatory Visit (INDEPENDENT_AMBULATORY_CARE_PROVIDER_SITE_OTHER): Payer: Medicare Other

## 2017-01-02 DIAGNOSIS — R300 Dysuria: Secondary | ICD-10-CM | POA: Diagnosis not present

## 2017-01-02 LAB — URINALYSIS, ROUTINE W REFLEX MICROSCOPIC
Bilirubin Urine: NEGATIVE
Hgb urine dipstick: NEGATIVE
Ketones, ur: NEGATIVE
Nitrite: NEGATIVE
RBC / HPF: NONE SEEN
Specific Gravity, Urine: 1.01
Total Protein, Urine: NEGATIVE
Urine Glucose: NEGATIVE
Urobilinogen, UA: 0.2
pH: 7.5 (ref 5.0–8.0)

## 2017-01-03 LAB — URINE CULTURE
MICRO NUMBER:: 81361353
SPECIMEN QUALITY: ADEQUATE

## 2017-01-25 ENCOUNTER — Other Ambulatory Visit: Payer: Self-pay | Admitting: Family Medicine

## 2017-02-16 ENCOUNTER — Other Ambulatory Visit: Payer: Self-pay | Admitting: Family Medicine

## 2017-02-19 ENCOUNTER — Ambulatory Visit
Admission: RE | Admit: 2017-02-19 | Discharge: 2017-02-19 | Disposition: A | Discharge status: Home / Self Care | Payer: Medicare Other | Source: Ambulatory Visit | Attending: Adult Health | Admitting: Adult Health

## 2017-02-19 ENCOUNTER — Ambulatory Visit: Payer: Medicare Other

## 2017-02-19 DIAGNOSIS — R928 Other abnormal and inconclusive findings on diagnostic imaging of breast: Secondary | ICD-10-CM | POA: Diagnosis not present

## 2017-02-19 DIAGNOSIS — N641 Fat necrosis of breast: Secondary | ICD-10-CM

## 2017-02-19 NOTE — Telephone Encounter (Signed)
Requesting:Klonopin Contract:no SLP:NPYY Last OV:08/14/16 Next OV:not scheduled  Last Refill:08/14/16  #[id number]   Please advise

## 2017-02-20 NOTE — Telephone Encounter (Signed)
She can have one refill but needs an appointment for any further refills, also needs a contract and UDS

## 2017-02-24 ENCOUNTER — Other Ambulatory Visit: Payer: Self-pay | Admitting: Family Medicine

## 2017-02-27 NOTE — Telephone Encounter (Signed)
He can have #10 tabs but I need her to do the uds and contract and make an appt then I can give her more

## 2017-02-28 ENCOUNTER — Other Ambulatory Visit: Payer: Self-pay | Admitting: Family Medicine

## 2017-03-08 DIAGNOSIS — N3010 Interstitial cystitis (chronic) without hematuria: Secondary | ICD-10-CM | POA: Diagnosis not present

## 2017-03-16 DIAGNOSIS — N3010 Interstitial cystitis (chronic) without hematuria: Secondary | ICD-10-CM | POA: Diagnosis not present

## 2017-04-01 DIAGNOSIS — M5431 Sciatica, right side: Secondary | ICD-10-CM | POA: Diagnosis not present

## 2017-05-01 NOTE — Progress Notes (Signed)
HPI: FU nonischemic cardiomyopathy. Patient had an echocardiogram in 2012 ejection fraction of 45-50%. Reduced LV function felt secondary to chemotherapy (herceptin). Nuclear study October 2015 showed an ejection fraction of 64%. There is a small area of moderate apical ischemia report. Cardiac catheterization November 2015 showed no obstructive coronary disease. Ejection fraction 50-55%. Last echocardiogram November 2017 showed normal LV systolic function, grade 2 diastolic dysfunction and mild left atrial enlargement. Carotid Dopplers January 2018 showed 1-39% bilateral stenosis.  Since last seen, she has mild dyspnea on exertion but no orthopnea, PND, pedal edema, exertional chest pain or syncope.  She does describe occasional indigestion after eating.   Current Outpatient Medications  Medication Sig Dispense Refill  . acetaminophen (TYLENOL) 500 MG tablet Take 500-1,000 mg by mouth every 6 (six) hours as needed for moderate pain or headache.     Marland Kitchen aspirin EC 81 MG tablet Take 81 mg by mouth daily.    . B Complex-C (SUPER B COMPLEX PO) Take 1 tablet by mouth daily.    . carvedilol (COREG) 25 MG tablet Take 0.5 tablets (12.5 mg total) by mouth 2 (two) times daily with a meal. Hold if your blood pressure is <120/80 30 tablet 0  . clonazePAM (KLONOPIN) 0.5 MG tablet TAKE 1/2 TO 1 TABLET BY MOUTH 3 TIMES A DAY AS NEEDED FOR ANXIETY 60 tablet 2  . ferrous sulfate 325 (65 FE) MG tablet Take 325 mg by mouth daily with breakfast.    . folic acid (FOLVITE) 1 MG tablet TAKE 1 TABLET BY MOUTH EVERY DAY 90 tablet 3  . gabapentin (NEURONTIN) 300 MG capsule TAKE 1 CAPSULE BY MOUTH TWICE A DAY 180 capsule 1  . hydrOXYzine (ATARAX/VISTARIL) 25 MG tablet Take 25 mg by mouth at bedtime.    . Multiple Vitamin (MULTIVITAMIN WITH MINERALS) TABS tablet Take 1 tablet by mouth daily.    Marland Kitchen nystatin (MYCOSTATIN/NYSTOP) powder Apply topically 4 (four) times daily. 15 g 0  . OVER THE COUNTER MEDICATION Place 1 drop  into both eyes 2 (two) times daily as needed (dry eyes/ irritation). Over the counter lubricating eye drop    . pentosan polysulfate (ELMIRON) 100 MG capsule Take 100 mg by mouth 2 (two) times daily.     . rosuvastatin (CRESTOR) 20 MG tablet TAKE 1 TABLET (20 MG TOTAL) BY MOUTH AT BEDTIME. 90 tablet 3  . telmisartan (MICARDIS) 80 MG tablet Take 0.5 tablets (40 mg total) by mouth daily. Hold if your blood pressure is <120/80 (Patient taking differently: Take 80 mg by mouth daily. Hold if your blood pressure is <120/80) 30 tablet 0  . venlafaxine XR (EFFEXOR-XR) 150 MG 24 hr capsule TAKE 1 CAPSULE (150 MG TOTAL) BY MOUTH DAILY. 90 capsule 1   No current facility-administered medications for this visit.      Past Medical History:  Diagnosis Date  . 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 Anemia 08/28/2011  . Asthma    as a child  . Brain tumor ([place])   . Breast cancer ([place])    right breast  . Carpal tunnel syndrome of left wrist   . Cataracts, bilateral 12/28/2015  . Constipation 03/03/2012  . DEPRESSION 08/11/2008  . Dermatitis   . Facial fracture ([place])   . GERD 08/11/2008  . H/O measles   . H/O mumps   . History of blood transfusion   . History of chicken pox   . History of hiatal hernia   .  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 HYPERLIPIDEMIA 08/11/2008  . HYPERTENSION 08/11/2008  . Hyponatremia 08/28/2011  . 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 Interstitial cystitis   . LIPOMA 02/10/2009  . Medicare annual wellness visit, subsequent 12/27/2014   Follows with Dr Amalia Hailey of urology Follows with Dr Posey Pronto at [facility] for opthamology Follows with Dr [name] of dermatology Follows with [facility] gastroenterology, last scope done in 2011 repeat in 10 years Last [facility] July 2015 should repeat every 1-2 years Last Pap December 2015, repeat in 3 years.   . Melanoma ([place]) 2008   DR. Ronnald Ramp  . Neuropathy   . NICM  (nonischemic cardiomyopathy) ([place]) 03/08/2010   likely 2/2 chemotx - a. Echo 2012: EF 45-50%;  b. Lex MV 2/12:  low risk, apical defect (small area of ischemia vs shifting breast atten);  c.  Echo 7/12: Normal wall thickness, EF 60-65%, normal wall motion, grade 1 diastolic dysfunction, mild LAE, PASP 32;   d. Lex MV 11/13:  EF 76%, no ischemia  . OA (osteoarthritis) 09/07/2011  . Obesity 09/28/2014  . Osteoporosis   . Personal history of chemotherapy 2012  . Personal history of radiation therapy 2012  . PREDIABETES 06/15/2009  . Recurrent falls 12/28/2015  . Shortness of breath   . UTI (lower urinary tract infection) 03/03/2012  . Vertebral fracture, osteoporotic, sequela 04/05/2016    Past Surgical History:  Procedure Laterality Date  . BREAST LUMPECTOMY  04/2009   RIGHT FOR BREAST CANCER-CHEMO/RADIATION X 1 YEAR  . BREAST REDUCTION SURGERY Bilateral 05/04/2014   Procedure: MAMMARY REDUCTION  (BREAST);  Surgeon: Cristine Polio, MD;  Location: [facility];  Service: Plastics;  Laterality: Bilateral;  . CARPAL TUNNEL RELEASE     L wrist, ulnar nerve moved  . CHOLECYSTECTOMY    . CRANIOTOMY FOR TUMOR  2000  . ELBOW SURGERY     left  . KNEE ARTHROSCOPY Left 10/12/2014   Procedure: LEFT KNEE ARTHROSCOPY ;  Surgeon: Gaynelle Arabian, MD;  Location: [facility];  Service: Orthopedics;  Laterality: Left;  . KYPHOPLASTY N/A 03/30/2016   Procedure: THORACIC 12 KYPHOPLASTY;  Surgeon: Phylliss Bob, MD;  Location: [facility];  Service: Orthopedics;  Laterality: N/A;  THORACIC 12 KYPHOPLASTY  . LEFT HEART CATHETERIZATION WITH CORONARY ANGIOGRAM N/A 12/16/2013   Procedure: LEFT HEART CATHETERIZATION WITH CORONARY ANGIOGRAM;  Surgeon: Peter M [name], MD;  Location: [facility] C[facility];  Service: Cardiovascular;  Laterality: N/A;  . LIPOMA EXCISION  03/28/2009   right leg  . MELANOMA EXCISION    . PORT-A-CATH REMOVAL  11/30/2010   [place]  . porta cath    . PORTACATH PLACEMENT  may 2011  . REDUCTION MAMMAPLASTY  Bilateral   . SYNOVECTOMY Left 10/12/2014   Procedure: WITH SYNOVECTOMY;  Surgeon: Gaynelle Arabian, MD;  Location: [facility];  Service: Orthopedics;  Laterality: Left;  . TONSILLECTOMY  1958  . TOTAL KNEE ARTHROPLASTY  02/05/2012   Procedure: TOTAL KNEE ARTHROPLASTY;  Surgeon: Gearlean Alf, MD;  Location: [facility];  Service: Orthopedics;  Laterality: Right;  . TOTAL KNEE ARTHROPLASTY Left 02/10/2013   Procedure: LEFT TOTAL KNEE ARTHROPLASTY;  Surgeon: Gearlean Alf, MD;  Location: [facility];  Service: Orthopedics;  Laterality: Left;  . total knee raplacement  01-2012   Right Knee  . TUBAL LIGATION  1997    Social History   Socioeconomic History  . Marital  status: Married    Spouse name: Not on file  . Number of children: 3  . Years of education: Not on file  . Highest education level: Not on file  Occupational History  . Occupation: Copywriter, advertising: [employer]  Social Needs  . Financial resource strain: Not on file  . Food insecurity:    Worry: Not on file    Inability: Not on file  . Transportation needs:    Medical: Not on file    Non-medical: Not on file  Tobacco Use  . Smoking status: Never Smoker  . Smokeless tobacco: Never Used  Substance and Sexual Activity  . Alcohol use: No  . Drug use: No  . Sexual activity: Never  Lifestyle  . Physical activity:    Days per week: Not on file    Minutes per session: Not on file  . Stress: Not on file  Relationships  . Social connections:    Talks on phone: Not on file    Gets together: Not on file    Attends religious service: Not on file    Active member of club or organization: Not on file    Attends meetings of clubs or organizations: Not on file    Relationship status: Not on file  . Intimate partner violence:    Fear of current or ex partner: Not on file    Emotionally abused: Not on file    Physically abused: Not on file    Forced sexual activity: Not on file  Other Topics Concern  . Not on file    Social History Narrative  . Not on file    Family History  Problem Relation Age of Onset  . Breast cancer Mother        sarcoma  . Lung cancer Mother   . Hypertension Mother   . Prostate cancer Father   . Congestive Heart Failure Father   . Heart attack Father   . Prostate cancer Brother   . Down syndrome Son   . Heart disease Maternal Grandfather        MI  . Stomach cancer Maternal Aunt   . Uterine cancer Maternal Aunt   . Colon cancer Neg Hx     ROS: no fevers or chills, productive cough, hemoptysis, dysphasia, odynophagia, melena, hematochezia, dysuria, hematuria, rash, seizure activity, orthopnea, PND, pedal edema, claudication. Remaining systems are negative.  Physical Exam: Well-developed well-nourished in no acute distress.  Skin is warm and dry.  HEENT is normal.  Neck is supple.  Chest is clear to auscultation with normal expansion.  Cardiovascular exam is regular rate and rhythm.  Abdominal exam nontender or distended. No masses palpated. Extremities show no edema. neuro grossly intact  ECG-sinus rhythm at a rate of 66.  No ST changes.  Personally reviewed  A/P  1 nonischemic cardiomyopathy-LV function improved on most recent echocardiogram.  Continue medical therapy including ARB and beta-blocker.  2 hypertension-blood pressure is controlled.  Continue present medications.  3 hyperlipidemia-continue statin.  4 chest pain-No new symptoms.  No plans for further ischemia evaluation at this point.  Kirk Ruths, MD

## 2017-05-08 ENCOUNTER — Encounter: Payer: Self-pay | Admitting: Cardiology

## 2017-05-08 ENCOUNTER — Ambulatory Visit (INDEPENDENT_AMBULATORY_CARE_PROVIDER_SITE_OTHER): Payer: Medicare Other | Admitting: Cardiology

## 2017-05-08 VITALS — BP 110/70 | HR 66 | Ht 64.0 in | Wt 189.4 lb

## 2017-05-08 DIAGNOSIS — I420 Dilated cardiomyopathy: Secondary | ICD-10-CM

## 2017-05-08 DIAGNOSIS — E7800 Pure hypercholesterolemia, unspecified: Secondary | ICD-10-CM | POA: Diagnosis not present

## 2017-05-08 DIAGNOSIS — I10 Essential (primary) hypertension: Secondary | ICD-10-CM

## 2017-05-08 NOTE — Patient Instructions (Signed)
Your physician wants you to follow-up in: ONE YEAR WITH DR CRENSHAW You will receive a reminder letter in the mail two months in advance. If you don't receive a letter, please call our office to schedule the follow-up appointment.   If you need a refill on your cardiac medications before your next appointment, please call your pharmacy.  

## 2017-05-27 ENCOUNTER — Other Ambulatory Visit: Payer: Self-pay | Admitting: Family Medicine

## 2017-06-07 ENCOUNTER — Emergency Department (HOSPITAL_COMMUNITY)
Admission: EM | Admit: 2017-06-07 | Discharge: 2017-06-08 | Disposition: A | Discharge status: Home / Self Care | Payer: Medicare Other | Attending: Emergency Medicine | Admitting: Emergency Medicine

## 2017-06-07 ENCOUNTER — Encounter (HOSPITAL_COMMUNITY): Payer: Self-pay | Admitting: Emergency Medicine

## 2017-06-07 ENCOUNTER — Emergency Department (HOSPITAL_COMMUNITY): Payer: Medicare Other

## 2017-06-07 DIAGNOSIS — R109 Unspecified abdominal pain: Secondary | ICD-10-CM | POA: Insufficient documentation

## 2017-06-07 DIAGNOSIS — Z853 Personal history of malignant neoplasm of breast: Secondary | ICD-10-CM | POA: Diagnosis not present

## 2017-06-07 DIAGNOSIS — R531 Weakness: Secondary | ICD-10-CM | POA: Diagnosis not present

## 2017-06-07 DIAGNOSIS — Z85841 Personal history of malignant neoplasm of brain: Secondary | ICD-10-CM | POA: Insufficient documentation

## 2017-06-07 DIAGNOSIS — I10 Essential (primary) hypertension: Secondary | ICD-10-CM | POA: Diagnosis not present

## 2017-06-07 DIAGNOSIS — Z7982 Long term (current) use of aspirin: Secondary | ICD-10-CM | POA: Insufficient documentation

## 2017-06-07 DIAGNOSIS — I6789 Other cerebrovascular disease: Secondary | ICD-10-CM | POA: Diagnosis not present

## 2017-06-07 DIAGNOSIS — Z96653 Presence of artificial knee joint, bilateral: Secondary | ICD-10-CM | POA: Insufficient documentation

## 2017-06-07 DIAGNOSIS — R42 Dizziness and giddiness: Secondary | ICD-10-CM | POA: Diagnosis not present

## 2017-06-07 DIAGNOSIS — Z79899 Other long term (current) drug therapy: Secondary | ICD-10-CM | POA: Insufficient documentation

## 2017-06-07 DIAGNOSIS — Z85820 Personal history of malignant melanoma of skin: Secondary | ICD-10-CM | POA: Diagnosis not present

## 2017-06-07 DIAGNOSIS — R55 Syncope and collapse: Secondary | ICD-10-CM | POA: Diagnosis not present

## 2017-06-07 LAB — URINALYSIS, ROUTINE W REFLEX MICROSCOPIC
BILIRUBIN URINE: NEGATIVE
GLUCOSE, UA: NEGATIVE mg/dL
HGB URINE DIPSTICK: NEGATIVE
Ketones, ur: NEGATIVE mg/dL
NITRITE: NEGATIVE
PROTEIN: NEGATIVE mg/dL
Specific Gravity, Urine: 1.004 — ABNORMAL LOW (ref 1.005–1.030)
pH: 7 (ref 5.0–8.0)

## 2017-06-07 LAB — HEPATIC FUNCTION PANEL
ALT: 27 U/L (ref 14–54)
AST: 27 U/L (ref 15–41)
Albumin: 4.1 g/dL (ref 3.5–5.0)
Alkaline Phosphatase: 106 U/L (ref 38–126)
Total Bilirubin: 0.5 mg/dL (ref 0.3–1.2)
Total Protein: 6.5 g/dL (ref 6.5–8.1)

## 2017-06-07 LAB — CBC
HCT: 40.1 % (ref 36.0–46.0)
Hemoglobin: 12.5 g/dL (ref 12.0–15.0)
MCH: 29.1 pg (ref 26.0–34.0)
MCHC: 31.2 g/dL (ref 30.0–36.0)
MCV: 93.5 fL (ref 78.0–100.0)
PLATELETS: 193 10*3/uL (ref 150–400)
RBC: 4.29 MIL/uL (ref 3.87–5.11)
RDW: 14.1 % (ref 11.5–15.5)
WBC: 7 10*3/uL (ref 4.0–10.5)

## 2017-06-07 LAB — BASIC METABOLIC PANEL
ANION GAP: 7 (ref 5–15)
BUN: 11 mg/dL (ref 6–20)
CALCIUM: 9.2 mg/dL (ref 8.9–10.3)
CO2: 29 mmol/L (ref 22–32)
CREATININE: 0.81 mg/dL (ref 0.44–1.00)
Chloride: 110 mmol/L (ref 101–111)
GFR calc non Af Amer: >60 mL/min (ref >60)
Glucose, Bld: 119 mg/dL — ABNORMAL HIGH (ref 65–99)
Potassium: 3.7 mmol/L (ref 3.5–5.1)
Sodium: 146 mmol/L — ABNORMAL HIGH (ref 135–145)

## 2017-06-07 LAB — LIPASE, BLOOD: Lipase: 37 U/L (ref 11–51)

## 2017-06-07 LAB — CBG MONITORING, ED: Glucose-Capillary: 111 mg/dL — ABNORMAL HIGH (ref 65–99)

## 2017-06-07 MED ORDER — SODIUM CHLORIDE 0.9 % IV BOLUS
1000.0000 mL | Freq: Once | INTRAVENOUS | Status: AC
  Administered 2017-06-07: 1000 mL via INTRAVENOUS

## 2017-06-07 MED ORDER — IOPAMIDOL (ISOVUE-370) INJECTION 76%
100.0000 mL | Freq: Once | INTRAVENOUS | Status: AC | PRN
  Administered 2017-06-07: 100 mL via INTRAVENOUS

## 2017-06-07 MED ORDER — MORPHINE SULFATE (PF) 4 MG/ML IV SOLN
2.0000 mg | Freq: Once | INTRAVENOUS | Status: AC
  Administered 2017-06-07: 2 mg via INTRAVENOUS
  Filled 2017-06-07: qty 1

## 2017-06-07 MED ORDER — IOPAMIDOL (ISOVUE-370) INJECTION 76%
INTRAVENOUS | Status: AC
  Filled 2017-06-07: qty 100

## 2017-06-07 MED ORDER — ONDANSETRON HCL 4 MG/2ML IJ SOLN
4.0000 mg | Freq: Once | INTRAMUSCULAR | Status: AC
  Administered 2017-06-07: 4 mg via INTRAVENOUS
  Filled 2017-06-07: qty 2

## 2017-06-07 NOTE — ED Provider Notes (Signed)
[facility] Provider Note   CSN: [id number] Arrival date & time: 06/07/17  [place]     History   Chief Complaint Chief Complaint  Patient presents with  . Weakness    HPI Shannon [name] is a 72 y.o. female.  73 yo F with a chief complaint of feeling weak.  This been going on for the past day.  She woke up this morning and felt that she had some abdominal pain and throughout the day has had difficulty with lightheadedness when she stands and moves around.  She denies vomiting or diarrhea denies dark stool or blood in her stool.  Denies trauma.  Denies fevers or chills denies cough.  She went to help her husband do some chores outside and when she went outside she felt really weak and went back inside and laid on the couch.  She felt that she was unable to really lift her arms or legs.  She denied chest pain or shortness of breath with this.  The house cleaners came by and found her in the state.  They called her daughter who then brought her into the emergency department.  Patient feels mildly better since then.  Continues to have the abdominal pain.  The history is provided by the patient.  Weakness  Primary symptoms include no dizziness. Pertinent negatives include no shortness of breath, no chest pain, no vomiting and no headaches.  Abdominal Pain   This is a new problem. The current episode started 6 to 12 hours ago. The problem occurs constantly. The problem has not changed since onset.The pain is located in the periumbilical region. The quality of the pain is aching and sharp. The pain is at a severity of 6/10. The pain is moderate. Pertinent negatives include fever, nausea, vomiting, dysuria, headaches, arthralgias and myalgias. Exacerbated by: ambulation. The symptoms are relieved by recumbency.    Past Medical History:  Diagnosis Date  . 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 Anemia 08/28/2011  . Asthma    as a child  . Brain  tumor ([place])   . Breast cancer ([place])    right breast  . Carpal tunnel syndrome of left wrist   . Cataracts, bilateral 12/28/2015  . Constipation 03/03/2012  . DEPRESSION 08/11/2008  . Dermatitis   . Facial fracture ([place])   . GERD 08/11/2008  . H/O measles   . H/O mumps   . History of blood transfusion   . History of chicken pox   . History of hiatal hernia   . 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 HYPERLIPIDEMIA 08/11/2008  . HYPERTENSION 08/11/2008  . Hyponatremia 08/28/2011  . 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 Interstitial cystitis   . LIPOMA 02/10/2009  . Medicare annual wellness visit, subsequent 12/27/2014   Follows with Dr Amalia Hailey of urology Follows with Dr Posey Pronto at [facility] for opthamology Follows with Dr [name] of dermatology Follows with [facility] gastroenterology, last scope done in 2011 repeat in 10 years Last [facility] July 2015 should repeat every 1-2 years Last Pap December 2015, repeat in 3 years.   . Melanoma ([place]) 2008   DR. Ronnald Ramp  . Neuropathy   . NICM (nonischemic cardiomyopathy) ([place]) 03/08/2010   likely 2/2 chemotx - a. Echo 2012: EF 45-50%;  b. Lex MV 2/12:  low risk, apical defect (small area of ischemia vs shifting  breast atten);  c.  Echo 7/12: Normal wall thickness, EF 60-65%, normal wall motion, grade 1 diastolic dysfunction, mild LAE, PASP 32;   d. Lex MV 11/13:  EF 76%, no ischemia  . OA (osteoarthritis) 09/07/2011  . Obesity 09/28/2014  . Osteoporosis   . Personal history of chemotherapy 2012  . Personal history of radiation therapy 2012  . PREDIABETES 06/15/2009  . Recurrent falls 12/28/2015  . Shortness of breath   . UTI (lower urinary tract infection) 03/03/2012  . Vertebral fracture, osteoporotic, sequela 04/05/2016    Patient Active Problem List   Diagnosis Date Noted  . Vertebral fracture, osteoporotic, sequela 04/05/2016  . Low back pain 03/06/2016  . Chest pain 02/27/2016  . Syncope  02/27/2016  . AKI (acute kidney injury) ([place]) 02/27/2016  . Cataracts, bilateral 12/28/2015  . Recurrent falls 12/28/2015  . Genetic testing 11/29/2015  . Family history- stomach cancer 09/08/2015  . Family history of cancer 09/08/2015  . [name] lesion 06/21/2015  . Left leg swelling 05/24/2015  . Medicare annual wellness visit, subsequent 12/27/2014  . 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 Obesity 09/28/2014  . Interstitial cystitis 09/28/2014  . Pain in joint, ankle and foot 09/28/2014  . Abnormal nuclear stress test 12/16/2013  . Sternal pain 11/06/2013  . Conjunctivitis of left eye 10/13/2013  . Postoperative anemia due to acute blood loss 02/13/2013  . Preop cardiovascular exam 12/30/2012  . 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 Breast cancer of lower-inner quadrant of right female breast ([place]) 10/21/2012  . Insomnia 10/18/2012  . UTI (urinary tract infection) 03/03/2012  . Constipation 03/03/2012  . OA (osteoarthritis) of knee 02/05/2012  . OA (osteoarthritis) 09/07/2011  . Dermatitis   . Hyponatremia 08/28/2011  . Osteoporosis 03/07/2011  . IC (interstitial cystitis)   . Melanoma ([place])   . Craniopharyngioma ([place]) 03/25/2010  . Prediabetes 06/15/2009  . LIPOMA 02/10/2009  . Dyspnea on exertion 02/10/2009  . Hyperlipidemia, mixed 08/11/2008  . Depression with anxiety 08/11/2008  . Essential hypertension 08/11/2008    Past Surgical History:  Procedure Laterality Date  . BREAST LUMPECTOMY  04/2009   RIGHT FOR BREAST CANCER-CHEMO/RADIATION X 1 YEAR  . BREAST REDUCTION SURGERY Bilateral 05/04/2014   Procedure: MAMMARY REDUCTION  (BREAST);  Surgeon: Cristine Polio, MD;  Location: [facility];  Service: Plastics;  Laterality: Bilateral;  . CARPAL TUNNEL RELEASE     L wrist, ulnar nerve moved  . CHOLECYSTECTOMY    . CRANIOTOMY FOR TUMOR  2000  . ELBOW SURGERY     left  . KNEE ARTHROSCOPY Left  10/12/2014   Procedure: LEFT KNEE ARTHROSCOPY ;  Surgeon: Gaynelle Arabian, MD;  Location: [facility];  Service: Orthopedics;  Laterality: Left;  . KYPHOPLASTY N/A 03/30/2016   Procedure: THORACIC 12 KYPHOPLASTY;  Surgeon: Phylliss Bob, MD;  Location: [facility];  Service: Orthopedics;  Laterality: N/A;  THORACIC 12 KYPHOPLASTY  . LEFT HEART CATHETERIZATION WITH CORONARY ANGIOGRAM N/A 12/16/2013   Procedure: LEFT HEART CATHETERIZATION WITH CORONARY ANGIOGRAM;  Surgeon: Peter M [name], MD;  Location: [facility] CATH LAB;  Service: Cardiovascular;  Laterality: N/A;  . LIPOMA EXCISION  03/28/2009   right leg  . MELANOMA EXCISION    . PORT-A-CATH REMOVAL  11/30/2010   [place]  . porta cath    . PORTACATH PLACEMENT  may 2011  . REDUCTION MAMMAPLASTY Bilateral   . SYNOVECTOMY Left 10/12/2014   Procedure: WITH SYNOVECTOMY;  Surgeon: Gaynelle Arabian, MD;  Location: [facility];  Service: Orthopedics;  Laterality: Left;  . TONSILLECTOMY  1958  . TOTAL KNEE ARTHROPLASTY  02/05/2012   Procedure: TOTAL KNEE ARTHROPLASTY;  Surgeon: Gearlean Alf, MD;  Location: [facility];  Service: Orthopedics;  Laterality: Right;  . TOTAL KNEE ARTHROPLASTY Left 02/10/2013   Procedure: LEFT TOTAL KNEE ARTHROPLASTY;  Surgeon: Gearlean Alf, MD;  Location: [facility];  Service: Orthopedics;  Laterality: Left;  . total knee raplacement  01-2012   Right Knee  . TUBAL LIGATION  1997     OB History    Gravida  3   Para  3   Term      Preterm      AB      Living  3     SAB      TAB      Ectopic      Multiple      Live Births               Home Medications    Prior to Admission medications   Medication Sig Start Date End Date Taking? Authorizing Provider  acetaminophen (TYLENOL) 500 MG tablet Take 500-1,000 mg by mouth every 6 (six) hours as needed for moderate pain or headache.    Yes [provider]  aspirin EC 81 MG tablet Take 81 mg by mouth daily.   Yes [provider]  B Complex-C (SUPER B COMPLEX PO) Take  1 tablet by mouth daily.   Yes [provider]  carvedilol (COREG) 25 MG tablet Take 0.5 tablets (12.5 mg total) by mouth 2 (two) times daily with a meal. Hold if your blood pressure is <120/80 02/28/16  Yes Mikhail, Clinical biochemist, DO  clonazePAM (KLONOPIN) 0.5 MG tablet TAKE 1/2 TO 1 TABLET BY MOUTH 3 TIMES A DAY AS NEEDED FOR ANXIETY 02/27/17  Yes Mosie Lukes, MD  ferrous sulfate 325 (65 FE) MG tablet Take 325 mg by mouth daily with breakfast.   Yes [provider]  folic acid (FOLVITE) 1 MG tablet TAKE 1 TABLET BY MOUTH EVERY DAY 12/26/16  Yes Mosie Lukes, MD  gabapentin (NEURONTIN) 300 MG capsule TAKE 1 CAPSULE BY MOUTH TWICE A DAY 02/26/17  Yes Mosie Lukes, MD  hydrOXYzine (ATARAX/VISTARIL) 25 MG tablet Take 25 mg by mouth at bedtime.   Yes [provider]  Multiple Vitamin (MULTIVITAMIN WITH MINERALS) TABS tablet Take 1 tablet by mouth daily.   Yes [provider]  pentosan polysulfate (ELMIRON) 100 MG capsule Take 100 mg by mouth 2 (two) times daily.  02/07/12  Yes Perkins, Alexzandrew L, PA-C  rosuvastatin (CRESTOR) 20 MG tablet TAKE 1 TABLET (20 MG TOTAL) BY MOUTH AT BEDTIME. 12/06/16  Yes Mosie Lukes, MD  telmisartan (MICARDIS) 80 MG tablet TAKE 1 TABLET BY MOUTH EVERY DAY Patient taking differently: TAKE 1/2 TABLET BY MOUTH EVERY DAY 05/29/17  Yes Mosie Lukes, MD  venlafaxine XR (EFFEXOR-XR) 150 MG 24 hr capsule TAKE 1 CAPSULE (150 MG TOTAL) BY MOUTH DAILY. 10/12/16  Yes Mosie Lukes, MD  nystatin (MYCOSTATIN/NYSTOP) powder Apply topically 4 (four) times daily. Patient not taking: Reported on 06/07/2017 08/07/16   Gardenia Phlegm, NP    Family History Family History  Problem Relation Age of Onset  . Breast cancer Mother        sarcoma  . Lung cancer Mother   . Hypertension Mother   . Prostate cancer Father   . Congestive Heart Failure Father   .  Heart attack Father   . Prostate cancer Brother   . Down syndrome Son   . Heart  disease Maternal Grandfather        MI  . Stomach cancer Maternal Aunt   . Uterine cancer Maternal Aunt   . Colon cancer Neg Hx     Social History Social History   Tobacco Use  . Smoking status: Never Smoker  . Smokeless tobacco: Never Used  Substance Use Topics  . Alcohol use: No  . Drug use: No     Allergies   Dilaudid [hydromorphone hcl]   Review of Systems Review of Systems  Constitutional: Negative for chills and fever.  HENT: Negative for congestion and rhinorrhea.   Eyes: Negative for redness and visual disturbance.  Respiratory: Negative for shortness of breath and wheezing.   Cardiovascular: Negative for chest pain and palpitations.  Gastrointestinal: Positive for abdominal pain. Negative for nausea and vomiting.  Genitourinary: Negative for dysuria and urgency.  Musculoskeletal: Negative for arthralgias and myalgias.  Skin: Negative for pallor and wound.  Neurological: Positive for syncope (near) and weakness. Negative for dizziness and headaches.     Physical Exam Updated Vital Signs BP 129/77   Pulse (!) 58   Temp 97.6 F (36.4 C) (Oral)   Resp 15   Ht '5\' 1"'  (1.549 m)   Wt 81.6 kg (180 lb)   LMP 01/30/1994   SpO2 99%   BMI 34.01 kg/m   Physical Exam  Constitutional: She is oriented to person, place, and time. She appears well-developed and well-nourished. No distress.  HENT:  Head: Normocephalic and atraumatic.  Eyes: Pupils are equal, round, and reactive to light. EOM are normal.  Neck: Normal range of motion. Neck supple.  Cardiovascular: Normal rate and regular rhythm. Exam reveals no gallop and no friction rub.  No murmur heard. Pulmonary/Chest: Effort normal. She has no wheezes. She has no rales.  Abdominal: Soft. She exhibits no distension and no mass. There is tenderness (diffuse, worst just above the umbilicus). There is no rebound and no guarding.  Musculoskeletal: She exhibits no edema or tenderness.  Neurological: She is alert and  oriented to person, place, and time.  Skin: Skin is warm and dry. She is not diaphoretic.  Psychiatric: She has a normal mood and affect. Her behavior is normal.  Nursing note and vitals reviewed.    ED Treatments / Results  Labs (all labs ordered are listed, but only abnormal results are displayed) Labs Reviewed  BASIC METABOLIC PANEL - Abnormal; Notable for the following components:      Result Value   Sodium 146 (*)    Glucose, Bld 119 (*)    All other components within normal limits  URINALYSIS, ROUTINE W REFLEX MICROSCOPIC - Abnormal; Notable for the following components:   Color, Urine STRAW (*)    Specific Gravity, Urine 1.004 (*)    Leukocytes, UA SMALL (*)    Bacteria, UA RARE (*)    All other components within normal limits  HEPATIC FUNCTION PANEL - Abnormal; Notable for the following components:   Bilirubin, Direct <0.1 (*)    All other components within normal limits  CBG MONITORING, ED - Abnormal; Notable for the following components:   Glucose-Capillary 111 (*)    All other components within normal limits  CBC  LIPASE, BLOOD  I-STAT TROPONIN, ED    EKG EKG Interpretation  Date/Time:  Thursday Jun 07 2017 16:55:58 EDT Ventricular Rate:  55 PR Interval:    QRS  Duration: 81 QT Interval:  424 QTC Calculation: 406 R Axis:   -14 Text Interpretation:  Sinus rhythm Low voltage, extremity and precordial leads No significant change since last tracing Confirmed by Deno Etienne [phone]) on 06/08/2017 12:18:06 AM Also confirmed by Deno Etienne [phone]), editor Hattie Perch ([id number])  on 06/08/2017 6:38:18 AM   Radiology Ct Head Wo Contrast  Result Date: 06/08/2017 CLINICAL DATA:  Weakness, near syncope EXAM: CT HEAD WITHOUT CONTRAST TECHNIQUE: Contiguous axial images were obtained from the base of the skull through the vertex without intravenous contrast. COMPARISON:  02/27/2016 FINDINGS: Brain: No evidence of acute infarction, hemorrhage, hydrocephalus, extra-axial  collection or mass lesion/mass effect. Subcortical white matter and periventricular small vessel ischemic changes. Vascular: Mild intracranial atherosclerosis. Skull: Normal. Negative for fracture or focal lesion. Sinuses/Orbits: The visualized paranasal sinuses are essentially clear. The mastoid air cells are unopacified. Other: None. IMPRESSION: No evidence of acute intracranial abnormality. Mild small vessel ischemic changes. Electronically Signed   By: Julian Hy M.D.   On: 06/08/2017 00:16   Ct Angio Chest/abd/pel For Dissection W And/or Wo Contrast  Result Date: 06/08/2017 CLINICAL DATA:  72 year old female with syncope. EXAM: CT ANGIOGRAPHY CHEST, ABDOMEN AND PELVIS TECHNIQUE: Multidetector CT imaging through the chest, abdomen and pelvis was performed using the standard protocol during bolus administration of intravenous contrast. Multiplanar reconstructed images and MIPs were obtained and reviewed to evaluate the vascular anatomy. CONTRAST:  114m ISOVUE-370 IOPAMIDOL (ISOVUE-370) INJECTION 76% COMPARISON:  Chest CT dated 05/26/2015 FINDINGS: CTA CHEST FINDINGS Cardiovascular: There is no cardiomegaly or pericardial effusion. Minimal atherosclerotic calcification of the thoracic aorta. No aneurysmal dilatation or evidence of dissection. The origins of the great vessels of the aortic arch are patent. There is no CT evidence of pulmonary embolism. Mediastinum/Nodes: No enlarged mediastinal, hilar, or axillary lymph nodes. Thyroid gland, trachea, and esophagus demonstrate no significant findings. Lungs/Pleura: There minimal bibasilar dependent atelectatic changes. Right middle lobe linear atelectasis/scarring noted. The lungs are clear. There is no pleural effusion pneumothorax. The central airways are patent. Musculoskeletal: No chest wall abnormality. No acute or significant osseous findings. Review of the MIP images confirms the above findings. CTA ABDOMEN AND PELVIS FINDINGS VASCULAR Aorta: Mild  atherosclerotic disease. No aneurysmal dilatation or dissection. Celiac: Patent without evidence of aneurysm, dissection, vasculitis or significant stenosis. SMA: Patent without evidence of aneurysm, dissection, vasculitis or significant stenosis. Renals: Both renal arteries are patent without evidence of aneurysm, dissection, vasculitis, fibromuscular dysplasia or significant stenosis. IMA: Patent without evidence of aneurysm, dissection, vasculitis or significant stenosis. Inflow: Patent without evidence of aneurysm, dissection, vasculitis or significant stenosis. Veins: No obvious venous abnormality within the limitations of this arterial phase study. Review of the MIP images confirms the above findings. NON-VASCULAR Hepatobiliary: The liver is unremarkable. No intrahepatic biliary ductal dilatation. Cholecystectomy. Pancreas: Unremarkable. No pancreatic ductal dilatation or surrounding inflammatory changes. Spleen: Normal in size without focal abnormality. Adrenals/Urinary Tract: Adrenal glands are unremarkable. Kidneys are normal, without renal calculi, focal lesion, or hydronephrosis. Bladder is unremarkable. Stomach/Bowel: Stomach is within normal limits. Appendix appears normal. No evidence of bowel wall thickening, distention, or inflammatory changes. Lymphatic: No significant vascular findings are present. No enlarged abdominal or pelvic lymph nodes. Reproductive: Small calcified uterine fibroid. The ovaries are grossly unremarkable. Other: None Musculoskeletal: T12 compression fracture and anterior wedging and vertebroplasty changes, new compared to the study of 2017. No acute fracture. Review of the MIP images confirms the above findings. IMPRESSION: 1. No acute intrathoracic, abdominal, or pelvic pathology. No aortic  aneurysm or dissection. No CT evidence of pulmonary embolism. 2. T12 compression fracture and vertebroplasty. No definite acute fracture. 3. Mild Aortic Atherosclerosis (ICD10-I70.0).  Electronically Signed   By: Anner Crete M.D.   On: 06/08/2017 00:27    Procedures Procedures (including critical care time) Procedure note: Ultrasound Guided Peripheral IV Ultrasound guided peripheral 1.88 inch angiocath IV placement performed by me. Indications: Nursing unable to place IV. Details: The antecubital fossa and upper arm were evaluated with a multifrequency linear probe. Patent brachial veins were noted. 1 attempt was made to cannulate a vein under realtime US guidance with successful cannulation of the vein and catheter placement. There is return of non-pulsatile dark red blood. The patient tolerated the procedure well without complications. Images archived electronically.  CPT codes: [phone] and [phone]  Medications Ordered in ED Medications  sodium chloride 0.9 % bolus 1,000 mL (0 mLs Intravenous Stopped 06/07/17 1939)  morphine 4 MG/ML injection 2 mg (2 mg Intravenous Given 06/07/17 1908)  ondansetron (ZOFRAN) injection 4 mg (4 mg Intravenous Given 06/07/17 1908)  iopamidol (ISOVUE-370) 76 % injection 100 mL (100 mLs Intravenous Contrast Given 06/07/17 2317)     Initial Impression / Assessment and Plan / ED Course  I have reviewed the triage vital signs and the nursing notes.  Pertinent labs & imaging results that were available during my care of the patient were reviewed by me and considered in my medical decision making (see chart for details).     72 yo F with a chief complaint of near syncope.  The only other symptom the patient has is abdominal pain.  She is tender diffusely about the abdomen but worse right above the umbilicus.  I will obtain a dissection study to evaluate the aorta, add on LFTs and lipase.  Give a bolus of fluids.  Patient feeling much better on reassessment.  Awaiting CT and trop. Discussed with Dr. Roxanne Mins, please see his note for further details.   The patients results and plan were reviewed and discussed.   Any x-rays performed were independently  reviewed by myself.   Differential diagnosis were considered with the presenting HPI.  Medications  sodium chloride 0.9 % bolus 1,000 mL (0 mLs Intravenous Stopped 06/07/17 1939)  morphine 4 MG/ML injection 2 mg (2 mg Intravenous Given 06/07/17 1908)  ondansetron (ZOFRAN) injection 4 mg (4 mg Intravenous Given 06/07/17 1908)  iopamidol (ISOVUE-370) 76 % injection 100 mL (100 mLs Intravenous Contrast Given 06/07/17 2317)    Vitals:   06/07/17 2215 06/07/17 2300 06/08/17 0006 06/08/17 0100  BP: 125/79 (!) 146/74 129/73 129/77  Pulse: (!) 52 61 (!) 58 (!) 58  Resp: '13 14 16 15  ' Temp:      TempSrc:      SpO2: 100% 94% 98% 99%  Weight:      Height:        Final diagnoses:  Weakness     Final Clinical Impressions(s) / ED Diagnoses   Final diagnoses:  Weakness    ED Discharge Orders    None       Deno Etienne, DO 06/08/17 1520

## 2017-06-07 NOTE — ED Triage Notes (Signed)
Per EMS, pt from home. Family reports pt has had weakness since this morning, near syncopal episodes with movement. Pt unable to ambulate d/t feeling weak. Pt talking slow, A&Ox4. Denies taking narcotics. Bradycardic in the 50s per EMS. VSS. Not orthostatic with ems. Rt arm restricted breast cancer hx. Pt has hx of vertigo and anemia.

## 2017-06-08 DIAGNOSIS — R531 Weakness: Secondary | ICD-10-CM | POA: Diagnosis not present

## 2017-06-08 LAB — I-STAT TROPONIN, ED: TROPONIN I, POC: 0 ng/mL (ref 0.00–0.08)

## 2017-06-08 NOTE — Discharge Instructions (Addendum)
The cause of your spell today is not clear. You do not show any sign of infection. There is no evidence of any vascular problem. All of your tests are normal. Please follow up with your primary care provider. Return to the emergency department if you are having any problems.

## 2017-06-08 NOTE — ED Notes (Signed)
Pt discharged from ED; instructions provided; Pt encouraged to return to ED if symptoms worsen and to f/u with PCP; Pt verbalized understanding of all instructions 

## 2017-06-08 NOTE — ED Provider Notes (Signed)
Care assumed from Dr. Tyrone Nine pending results of CT angiogram of chest, abdomen, pelvis and CT of head.  CT scans are unremarkable.  No evidence of dissection or any other acute process.  Laboratory work-up is unremarkable.  Patient is back to baseline.  She is discharged with instructions to follow-up with PCP.  Return precautions discussed.  Results for orders placed or performed during the hospital encounter of 16/10/96  Basic metabolic panel  Result Value Ref Range   Sodium 146 (H) 135 - 145 mmol/L   Potassium 3.7 3.5 - 5.1 mmol/L   Chloride 110 101 - 111 mmol/L   CO2 29 22 - 32 mmol/L   Glucose, Bld 119 (H) 65 - 99 mg/dL   BUN 11 6 - 20 mg/dL   Creatinine, Ser 0.81 0.44 - 1.00 mg/dL   Calcium 9.2 8.9 - 10.3 mg/dL   GFR calc non Af Amer >60 >60 mL/min   GFR calc Af Amer >60 >60 mL/min   Anion gap 7 5 - 15  CBC  Result Value Ref Range   WBC 7.0 4.0 - 10.5 K/uL   RBC 4.29 3.87 - 5.11 MIL/uL   Hemoglobin 12.5 12.0 - 15.0 g/dL   HCT 40.1 36.0 - 46.0 %   MCV 93.5 78.0 - 100.0 fL   MCH 29.1 26.0 - 34.0 pg   MCHC 31.2 30.0 - 36.0 g/dL   RDW 14.1 11.5 - 15.5 %   Platelets 193 150 - 400 K/uL  Urinalysis, Routine w reflex microscopic  Result Value Ref Range   Color, Urine STRAW (A) YELLOW   APPearance CLEAR CLEAR   Specific Gravity, Urine 1.004 (L) 1.005 - 1.030   pH 7.0 5.0 - 8.0   Glucose, UA NEGATIVE NEGATIVE mg/dL   Hgb urine dipstick NEGATIVE NEGATIVE   Bilirubin Urine NEGATIVE NEGATIVE   Ketones, ur NEGATIVE NEGATIVE mg/dL   Protein, ur NEGATIVE NEGATIVE mg/dL   Nitrite NEGATIVE NEGATIVE   Leukocytes, UA SMALL (A) NEGATIVE   RBC / HPF 0-5 0 - 5 RBC/hpf   WBC, UA 6-10 0 - 5 WBC/hpf   Bacteria, UA RARE (A) NONE SEEN   Squamous Epithelial / LPF 0-5 0 - 5  Hepatic function panel  Result Value Ref Range   Total Protein 6.5 6.5 - 8.1 g/dL   Albumin 4.1 3.5 - 5.0 g/dL   AST 27 15 - 41 U/L   ALT 27 14 - 54 U/L   Alkaline Phosphatase 106 38 - 126 U/L   Total Bilirubin 0.5 0.3  - 1.2 mg/dL   Bilirubin, Direct <0.1 (L) 0.1 - 0.5 mg/dL   Indirect Bilirubin NOT CALCULATED 0.3 - 0.9 mg/dL  Lipase, blood  Result Value Ref Range   Lipase 37 11 - 51 U/L  CBG monitoring, ED  Result Value Ref Range   Glucose-Capillary 111 (H) 65 - 99 mg/dL   Ct Head Wo Contrast  Result Date: 06/08/2017 CLINICAL DATA:  Weakness, near syncope EXAM: CT HEAD WITHOUT CONTRAST TECHNIQUE: Contiguous axial images were obtained from the base of the skull through the vertex without intravenous contrast. COMPARISON:  02/27/2016 FINDINGS: Brain: No evidence of acute infarction, hemorrhage, hydrocephalus, extra-axial collection or mass lesion/mass effect. Subcortical white matter and periventricular small vessel ischemic changes. Vascular: Mild intracranial atherosclerosis. Skull: Normal. Negative for fracture or focal lesion. Sinuses/Orbits: The visualized paranasal sinuses are essentially clear. The mastoid air cells are unopacified. Other: None. IMPRESSION: No evidence of acute intracranial abnormality. Mild small vessel ischemic changes. Electronically Signed  By: Julian Hy M.D.   On: 06/08/2017 00:16   Ct Angio Chest/abd/pel For Dissection W And/or Wo Contrast  Result Date: 06/08/2017 CLINICAL DATA:  72 year old female with syncope. EXAM: CT ANGIOGRAPHY CHEST, ABDOMEN AND PELVIS TECHNIQUE: Multidetector CT imaging through the chest, abdomen and pelvis was performed using the standard protocol during bolus administration of intravenous contrast. Multiplanar reconstructed images and MIPs were obtained and reviewed to evaluate the vascular anatomy. CONTRAST:  128mL ISOVUE-370 IOPAMIDOL (ISOVUE-370) INJECTION 76% COMPARISON:  Chest CT dated 05/26/2015 FINDINGS: CTA CHEST FINDINGS Cardiovascular: There is no cardiomegaly or pericardial effusion. Minimal atherosclerotic calcification of the thoracic aorta. No aneurysmal dilatation or evidence of dissection. The origins of the great vessels of the aortic  arch are patent. There is no CT evidence of pulmonary embolism. Mediastinum/Nodes: No enlarged mediastinal, hilar, or axillary lymph nodes. Thyroid gland, trachea, and esophagus demonstrate no significant findings. Lungs/Pleura: There minimal bibasilar dependent atelectatic changes. Right middle lobe linear atelectasis/scarring noted. The lungs are clear. There is no pleural effusion pneumothorax. The central airways are patent. Musculoskeletal: No chest wall abnormality. No acute or significant osseous findings. Review of the MIP images confirms the above findings. CTA ABDOMEN AND PELVIS FINDINGS VASCULAR Aorta: Mild atherosclerotic disease. No aneurysmal dilatation or dissection. Celiac: Patent without evidence of aneurysm, dissection, vasculitis or significant stenosis. SMA: Patent without evidence of aneurysm, dissection, vasculitis or significant stenosis. Renals: Both renal arteries are patent without evidence of aneurysm, dissection, vasculitis, fibromuscular dysplasia or significant stenosis. IMA: Patent without evidence of aneurysm, dissection, vasculitis or significant stenosis. Inflow: Patent without evidence of aneurysm, dissection, vasculitis or significant stenosis. Veins: No obvious venous abnormality within the limitations of this arterial phase study. Review of the MIP images confirms the above findings. NON-VASCULAR Hepatobiliary: The liver is unremarkable. No intrahepatic biliary ductal dilatation. Cholecystectomy. Pancreas: Unremarkable. No pancreatic ductal dilatation or surrounding inflammatory changes. Spleen: Normal in size without focal abnormality. Adrenals/Urinary Tract: Adrenal glands are unremarkable. Kidneys are normal, without renal calculi, focal lesion, or hydronephrosis. Bladder is unremarkable. Stomach/Bowel: Stomach is within normal limits. Appendix appears normal. No evidence of bowel wall thickening, distention, or inflammatory changes. Lymphatic: No significant vascular  findings are present. No enlarged abdominal or pelvic lymph nodes. Reproductive: Small calcified uterine fibroid. The ovaries are grossly unremarkable. Other: None Musculoskeletal: T12 compression fracture and anterior wedging and vertebroplasty changes, new compared to the study of 2017. No acute fracture. Review of the MIP images confirms the above findings. IMPRESSION: 1. No acute intrathoracic, abdominal, or pelvic pathology. No aortic aneurysm or dissection. No CT evidence of pulmonary embolism. 2. T12 compression fracture and vertebroplasty. No definite acute fracture. 3. Mild Aortic Atherosclerosis (ICD10-I70.0). Electronically Signed   By: Anner Crete M.D.   On: 06/08/2017 00:27   Images viewed by me.    Delora Fuel, MD 03/00/92 0111

## 2017-06-14 ENCOUNTER — Encounter: Payer: Self-pay | Admitting: Family Medicine

## 2017-06-14 ENCOUNTER — Ambulatory Visit (INDEPENDENT_AMBULATORY_CARE_PROVIDER_SITE_OTHER): Payer: Medicare Other | Admitting: Family Medicine

## 2017-06-14 VITALS — BP 140/66 | HR 74 | Temp 98.1°F | Resp 18 | Wt 184.6 lb

## 2017-06-14 DIAGNOSIS — E559 Vitamin D deficiency, unspecified: Secondary | ICD-10-CM | POA: Diagnosis not present

## 2017-06-14 DIAGNOSIS — E782 Mixed hyperlipidemia: Secondary | ICD-10-CM | POA: Diagnosis not present

## 2017-06-14 DIAGNOSIS — R739 Hyperglycemia, unspecified: Secondary | ICD-10-CM

## 2017-06-14 DIAGNOSIS — F418 Other specified anxiety disorders: Secondary | ICD-10-CM | POA: Diagnosis not present

## 2017-06-14 DIAGNOSIS — I10 Essential (primary) hypertension: Secondary | ICD-10-CM

## 2017-06-14 HISTORY — DX: Vitamin D deficiency, unspecified: E55.9

## 2017-06-14 MED ORDER — VENLAFAXINE HCL ER 225 MG PO TB24: 225.0000 mg | ORAL_TABLET | Freq: Every day | ORAL | 2 refills | Status: DC

## 2017-06-14 NOTE — Assessment & Plan Note (Addendum)
Well controlled, no changes to meds. Encouraged heart healthy diet such as the DASH diet and exercise as tolerated.  °

## 2017-06-14 NOTE — Patient Instructions (Signed)
DASH Eating Plan DASH stands for "Dietary Approaches to Stop Hypertension." The DASH eating plan is a healthy eating plan that has been shown to reduce high blood pressure (hypertension). It may also reduce your risk for type 2 diabetes, heart disease, and stroke. The DASH eating plan may also help with weight loss. What are tips for following this plan? General guidelines  Avoid eating more than 2,300 mg (milligrams) of salt (sodium) a day. If you have hypertension, you may need to reduce your sodium intake to 1,500 mg a day.  Limit alcohol intake to no more than 1 drink a day for nonpregnant women and 2 drinks a day for men. One drink equals 12 oz of beer, 5 oz of wine, or 1 oz of hard liquor.  Work with your health care provider to maintain a healthy body weight or to lose weight. Ask what an ideal weight is for you.  Get at least 30 minutes of exercise that causes your heart to beat faster (aerobic exercise) most days of the week. Activities may include walking, swimming, or biking.  Work with your health care provider or diet and nutrition specialist (dietitian) to adjust your eating plan to your individual calorie needs. Reading food labels  Check food labels for the amount of sodium per serving. Choose foods with less than 5 percent of the Daily Value of sodium. Generally, foods with less than 300 mg of sodium per serving fit into this eating plan.  To find whole grains, look for the word "whole" as the first word in the ingredient list. Shopping  Buy products labeled as "low-sodium" or "no salt added."  Buy fresh foods. Avoid canned foods and premade or frozen meals. Cooking  Avoid adding salt when cooking. Use salt-free seasonings or herbs instead of table salt or sea salt. Check with your health care provider or pharmacist before using salt substitutes.  Do not fry foods. Cook foods using healthy methods such as baking, boiling, grilling, and broiling instead.  Cook with  heart-healthy oils, such as olive, canola, soybean, or sunflower oil. Meal planning   Eat a balanced diet that includes: ? 5 or more servings of fruits and vegetables each day. At each meal, try to fill half of your plate with fruits and vegetables. ? Up to 6-8 servings of whole grains each day. ? Less than 6 oz of lean meat, poultry, or fish each day. A 3-oz serving of meat is about the same size as a deck of cards. One egg equals 1 oz. ? 2 servings of low-fat dairy each day. ? A serving of nuts, seeds, or beans 5 times each week. ? Heart-healthy fats. Healthy fats called Omega-3 fatty acids are found in foods such as flaxseeds and coldwater fish, like sardines, salmon, and mackerel.  Limit how much you eat of the following: ? Canned or prepackaged foods. ? Food that is high in trans fat, such as fried foods. ? Food that is high in saturated fat, such as fatty meat. ? Sweets, desserts, sugary drinks, and other foods with added sugar. ? Full-fat dairy products.  Do not salt foods before eating.  Try to eat at least 2 vegetarian meals each week.  Eat more home-cooked food and less restaurant, buffet, and fast food.  When eating at a restaurant, ask that your food be prepared with less salt or no salt, if possible. What foods are recommended? The items listed may not be a complete list. Talk with your dietitian about what   dietary choices are best for you. Grains Whole-grain or whole-wheat bread. Whole-grain or whole-wheat pasta. Brown rice. Oatmeal. Quinoa. Bulgur. Whole-grain and low-sodium cereals. Pita bread. Low-fat, low-sodium crackers. Whole-wheat flour tortillas. Vegetables Fresh or frozen vegetables (raw, steamed, roasted, or grilled). Low-sodium or reduced-sodium tomato and vegetable juice. Low-sodium or reduced-sodium tomato sauce and tomato paste. Low-sodium or reduced-sodium canned vegetables. Fruits All fresh, dried, or frozen fruit. Canned fruit in natural juice (without  added sugar). Meat and other protein foods Skinless chicken or turkey. Ground chicken or turkey. Pork with fat trimmed off. Fish and seafood. Egg whites. Dried beans, peas, or lentils. Unsalted nuts, nut butters, and seeds. Unsalted canned beans. Lean cuts of beef with fat trimmed off. Low-sodium, lean deli meat. Dairy Low-fat (1%) or fat-free (skim) milk. Fat-free, low-fat, or reduced-fat cheeses. Nonfat, low-sodium ricotta or cottage cheese. Low-fat or nonfat yogurt. Low-fat, low-sodium cheese. Fats and oils Soft margarine without trans fats. Vegetable oil. Low-fat, reduced-fat, or light mayonnaise and salad dressings (reduced-sodium). Canola, safflower, olive, soybean, and sunflower oils. Avocado. Seasoning and other foods Herbs. Spices. Seasoning mixes without salt. Unsalted popcorn and pretzels. Fat-free sweets. What foods are not recommended? The items listed may not be a complete list. Talk with your dietitian about what dietary choices are best for you. Grains Baked goods made with fat, such as croissants, muffins, or some breads. Dry pasta or rice meal packs. Vegetables Creamed or fried vegetables. Vegetables in a cheese sauce. Regular canned vegetables (not low-sodium or reduced-sodium). Regular canned tomato sauce and paste (not low-sodium or reduced-sodium). Regular tomato and vegetable juice (not low-sodium or reduced-sodium). Pickles. Olives. Fruits Canned fruit in a light or heavy syrup. Fried fruit. Fruit in cream or butter sauce. Meat and other protein foods Fatty cuts of meat. Ribs. Fried meat. Bacon. Sausage. Bologna and other processed lunch meats. Salami. Fatback. Hotdogs. Bratwurst. Salted nuts and seeds. Canned beans with added salt. Canned or smoked fish. Whole eggs or egg yolks. Chicken or turkey with skin. Dairy Whole or 2% milk, cream, and half-and-half. Whole or full-fat cream cheese. Whole-fat or sweetened yogurt. Full-fat cheese. Nondairy creamers. Whipped toppings.  Processed cheese and cheese spreads. Fats and oils Butter. Stick margarine. Lard. Shortening. Ghee. Bacon fat. Tropical oils, such as coconut, palm kernel, or palm oil. Seasoning and other foods Salted popcorn and pretzels. Onion salt, garlic salt, seasoned salt, table salt, and sea salt. Worcestershire sauce. Tartar sauce. Barbecue sauce. Teriyaki sauce. Soy sauce, including reduced-sodium. Steak sauce. Canned and packaged gravies. Fish sauce. Oyster sauce. Cocktail sauce. Horseradish that you find on the shelf. Ketchup. Mustard. Meat flavorings and tenderizers. Bouillon cubes. Hot sauce and Tabasco sauce. Premade or packaged marinades. Premade or packaged taco seasonings. Relishes. Regular salad dressings. Where to find more information:  National Heart, Lung, and Blood Institute: www.nhlbi.nih.gov  American Heart Association: www.heart.org Summary  The DASH eating plan is a healthy eating plan that has been shown to reduce high blood pressure (hypertension). It may also reduce your risk for type 2 diabetes, heart disease, and stroke.  With the DASH eating plan, you should limit salt (sodium) intake to 2,300 mg a day. If you have hypertension, you may need to reduce your sodium intake to 1,500 mg a day.  When on the DASH eating plan, aim to eat more fresh fruits and vegetables, whole grains, lean proteins, low-fat dairy, and heart-healthy fats.  Work with your health care provider or diet and nutrition specialist (dietitian) to adjust your eating plan to your individual   calorie needs. This information is not intended to replace advice given to you by your health care provider. Make sure you discuss any questions you have with your health care provider. Document Released: 01/05/2011 Document Revised: 01/10/2016 Document Reviewed: 01/10/2016 [employer] Interactive Patient Education  2018 [employer]  

## 2017-06-14 NOTE — Assessment & Plan Note (Signed)
Check level 

## 2017-06-14 NOTE — Progress Notes (Signed)
Subjective:  I acted as a Education administrator for Dr. Charlett Blake. [name], [place]  Patient ID: CARLIN MAMONE, female    DOB: December 15, 1945, 72 y.o.   MRN: [id number]  No chief complaint on file.   HPI  Patient is in today for a follow up visit for HTN and various medical concerns. She presented to the Emergency Room last week with weakness and her evaluation was unremarkable. She is under a great deal of stress taking care of her disabled adult son and her husband. She does not get much sleep and very little time away. She is tearful and frustrated with her state of health. No recent fever or falls. Denies CP/palp/SOB/HA/congestion/fevers/GI or GU c/o. Taking meds as prescribed  Patient Care Team: Mosie Lukes, MD as PCP - General (Family Medicine) Domingo Pulse, MD as Consulting Physician (Urology) Calvert Cantor, MD as Consulting Physician (Ophthalmology) Stanford Breed Denice Bors, MD as Consulting Physician (Cardiology)   Past Medical History:  Diagnosis Date  . 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 Anemia 08/28/2011  . Asthma    as a child  . Brain tumor ([place])   . Breast cancer ([place])    right breast  . Carpal tunnel syndrome of left wrist   . Cataracts, bilateral 12/28/2015  . Constipation 03/03/2012  . DEPRESSION 08/11/2008  . Dermatitis   . Facial fracture ([place])   . GERD 08/11/2008  . H/O measles   . H/O mumps   . History of blood transfusion   . History of chicken pox   . History of hiatal hernia   . 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 HYPERLIPIDEMIA 08/11/2008  . HYPERTENSION 08/11/2008  . Hyponatremia 08/28/2011  . 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 Interstitial cystitis   . LIPOMA 02/10/2009  . Medicare annual wellness visit, subsequent 12/27/2014   Follows with Dr Amalia Hailey of urology Follows with Dr Posey Pronto at [facility] for opthamology Follows with Dr [name] of dermatology Follows with [facility] gastroenterology,  last scope done in 2011 repeat in 10 years Last [facility] July 2015 should repeat every 1-2 years Last Pap December 2015, repeat in 3 years.   . Melanoma ([place]) 2008   DR. Ronnald Ramp  . Neuropathy   . NICM (nonischemic cardiomyopathy) ([place]) 03/08/2010   likely 2/2 chemotx - a. Echo 2012: EF 45-50%;  b. Lex MV 2/12:  low risk, apical defect (small area of ischemia vs shifting breast atten);  c.  Echo 7/12: Normal wall thickness, EF 60-65%, normal wall motion, grade 1 diastolic dysfunction, mild LAE, PASP 32;   d. Lex MV 11/13:  EF 76%, no ischemia  . OA (osteoarthritis) 09/07/2011  . Obesity 09/28/2014  . Osteoporosis   . Personal history of chemotherapy 2012  . Personal history of radiation therapy 2012  . PREDIABETES 06/15/2009  . Recurrent falls 12/28/2015  . Shortness of breath   . UTI (lower urinary tract infection) 03/03/2012  . Vertebral fracture, osteoporotic, sequela 04/05/2016    Past Surgical History:  Procedure Laterality Date  . BREAST LUMPECTOMY  04/2009   RIGHT FOR BREAST CANCER-CHEMO/RADIATION X 1 YEAR  . BREAST REDUCTION SURGERY Bilateral 05/04/2014   Procedure: MAMMARY REDUCTION  (BREAST);  Surgeon: Cristine Polio, MD;  Location: [facility];  Service: Plastics;  Laterality: Bilateral;  . CARPAL TUNNEL RELEASE     L wrist, ulnar nerve moved  .  CHOLECYSTECTOMY    . CRANIOTOMY FOR TUMOR  2000  . ELBOW SURGERY     left  . KNEE ARTHROSCOPY Left 10/12/2014   Procedure: LEFT KNEE ARTHROSCOPY ;  Surgeon: Gaynelle Arabian, MD;  Location: [facility];  Service: Orthopedics;  Laterality: Left;  . KYPHOPLASTY N/A 03/30/2016   Procedure: THORACIC 12 KYPHOPLASTY;  Surgeon: Phylliss Bob, MD;  Location: [facility];  Service: Orthopedics;  Laterality: N/A;  THORACIC 12 KYPHOPLASTY  . LEFT HEART CATHETERIZATION WITH CORONARY ANGIOGRAM N/A 12/16/2013   Procedure: LEFT HEART CATHETERIZATION WITH CORONARY ANGIOGRAM;  Surgeon: Peter M [name], MD;  Location: [facility] CATH LAB;  Service: Cardiovascular;  Laterality:  N/A;  . LIPOMA EXCISION  03/28/2009   right leg  . MELANOMA EXCISION    . PORT-A-CATH REMOVAL  11/30/2010   [place]reck  . porta cath    . PORTACATH PLACEMENT  may 2011  . REDUCTION MAMMAPLASTY Bilateral   . SYNOVECTOMY Left 10/12/2014   Procedure: WITH SYNOVECTOMY;  Surgeon: Gaynelle Arabian, MD;  Location: [facility];  Service: Orthopedics;  Laterality: Left;  . TONSILLECTOMY  1958  . TOTAL KNEE ARTHROPLASTY  02/05/2012   Procedure: TOTAL KNEE ARTHROPLASTY;  Surgeon: Gearlean Alf, MD;  Location: [facility];  Service: Orthopedics;  Laterality: Right;  . TOTAL KNEE ARTHROPLASTY Left 02/10/2013   Procedure: LEFT TOTAL KNEE ARTHROPLASTY;  Surgeon: Gearlean Alf, MD;  Location: [facility];  Service: Orthopedics;  Laterality: Left;  . total knee raplacement  01-2012   Right Knee  . TUBAL LIGATION  1997    Family History  Problem Relation Age of Onset  . Breast cancer Mother        sarcoma  . Lung cancer Mother   . Hypertension Mother   . Prostate cancer Father   . Congestive Heart Failure Father   . Heart attack Father   . Prostate cancer Brother   . Down syndrome Son   . Heart disease Maternal Grandfather        MI  . Stomach cancer Maternal Aunt   . Uterine cancer Maternal Aunt   . Colon cancer Neg Hx     Social History   Socioeconomic History  . Marital status: Married    Spouse name: Not on file  . Number of children: 3  . Years of education: Not on file  . Highest education level: Not on file  Occupational History  . Occupation: Copywriter, advertising: [employer]  Social Needs  . Financial resource strain: Not on file  . Food insecurity:    Worry: Not on file    Inability: Not on file  . Transportation needs:    Medical: Not on file    Non-medical: Not on file  Tobacco Use  . Smoking status: Never Smoker  . Smokeless tobacco: Never Used  Substance and Sexual Activity  . Alcohol use: No  . Drug use: No  . Sexual activity: Never  Lifestyle  . Physical activity:      Days per week: Not on file    Minutes per session: Not on file  . Stress: Not on file  Relationships  . Social connections:    Talks on phone: Not on file    Gets together: Not on file    Attends religious service: Not on file    Active member of club or organization: Not on file    Attends meetings of clubs or organizations: Not on file    Relationship status: Not on  file  . Intimate partner violence:    Fear of current or ex partner: Not on file    Emotionally abused: Not on file    Physically abused: Not on file    Forced sexual activity: Not on file  Other Topics Concern  . Not on file  Social History Narrative  . Not on file    Outpatient Medications Prior to Visit  Medication Sig Dispense Refill  . acetaminophen (TYLENOL) 500 MG tablet Take 500-1,000 mg by mouth every 6 (six) hours as needed for moderate pain or headache.     Marland Kitchen aspirin EC 81 MG tablet Take 81 mg by mouth daily.    . B Complex-C (SUPER B COMPLEX PO) Take 1 tablet by mouth daily.    . carvedilol (COREG) 25 MG tablet Take 0.5 tablets (12.5 mg total) by mouth 2 (two) times daily with a meal. Hold if your blood pressure is <120/80 30 tablet 0  . clonazePAM (KLONOPIN) 0.5 MG tablet TAKE 1/2 TO 1 TABLET BY MOUTH 3 TIMES A DAY AS NEEDED FOR ANXIETY 60 tablet 2  . ferrous sulfate 325 (65 FE) MG tablet Take 325 mg by mouth daily with breakfast.    . folic acid (FOLVITE) 1 MG tablet TAKE 1 TABLET BY MOUTH EVERY DAY 90 tablet 3  . gabapentin (NEURONTIN) 300 MG capsule TAKE 1 CAPSULE BY MOUTH TWICE A DAY 180 capsule 1  . hydrOXYzine (ATARAX/VISTARIL) 25 MG tablet Take 25 mg by mouth at bedtime.    . Multiple Vitamin (MULTIVITAMIN WITH MINERALS) TABS tablet Take 1 tablet by mouth daily.    Marland Kitchen nystatin (MYCOSTATIN/NYSTOP) powder Apply topically 4 (four) times daily. 15 g 0  . pentosan polysulfate (ELMIRON) 100 MG capsule Take 100 mg by mouth 2 (two) times daily.     . rosuvastatin (CRESTOR) 20 MG tablet TAKE 1 TABLET  (20 MG TOTAL) BY MOUTH AT BEDTIME. 90 tablet 3  . telmisartan (MICARDIS) 80 MG tablet TAKE 1 TABLET BY MOUTH EVERY DAY (Patient taking differently: TAKE 1/2 TABLET BY MOUTH EVERY DAY) 90 tablet 1  . venlafaxine XR (EFFEXOR-XR) 150 MG 24 hr capsule TAKE 1 CAPSULE (150 MG TOTAL) BY MOUTH DAILY. 90 capsule 1   No facility-administered medications prior to visit.     Allergies  Allergen Reactions  . Dilaudid [Hydromorphone Hcl] Itching    Review of Systems  Constitutional: Positive for malaise/fatigue. Negative for fever.  HENT: Negative for congestion.   Eyes: Negative for blurred vision.  Respiratory: Negative for shortness of breath.   Cardiovascular: Negative for chest pain, palpitations and leg swelling.  Gastrointestinal: Negative for abdominal pain, blood in stool and nausea.  Genitourinary: Negative for dysuria and frequency.  Musculoskeletal: Positive for myalgias. Negative for falls.  Skin: Negative for rash.  Neurological: Positive for weakness. Negative for dizziness, loss of consciousness and headaches.  Endo/Heme/Allergies: Negative for environmental allergies.  Psychiatric/Behavioral: Positive for depression. The patient is nervous/anxious.        Objective:    Physical Exam  Constitutional: She is oriented to person, place, and time. No distress.  HENT:  Head: Normocephalic and atraumatic.  Eyes: Conjunctivae are normal.  Neck: Neck supple. No thyromegaly present.  Cardiovascular: Normal rate, regular rhythm and normal heart sounds.  No murmur heard. Pulmonary/Chest: Effort normal and breath sounds normal. She has no wheezes.  Abdominal: She exhibits no distension and no mass.  Musculoskeletal: She exhibits no edema.  Lymphadenopathy:    She has no cervical adenopathy.  Neurological: She is alert and oriented to person, place, and time.  Skin: Skin is warm and dry. No rash noted. She is not diaphoretic.  Psychiatric: Judgment normal.    BP 140/66 (BP  Location: Left Arm, Patient Position: Sitting, Cuff Size: Normal)   Pulse 74   Temp 98.1 F (36.7 C) (Oral)   Resp 18   Wt 184 lb 9.6 oz (83.7 kg)   LMP 01/30/1994   SpO2 97%   BMI 34.88 kg/m  Wt Readings from Last 3 Encounters:  06/14/17 184 lb 9.6 oz (83.7 kg)  06/07/17 180 lb (81.6 kg)  05/08/17 189 lb 6.4 oz (85.9 kg)   BP Readings from Last 3 Encounters:  06/14/17 140/66  06/08/17 129/77  05/08/17 110/70     Immunization History  Administered Date(s) Administered  . Influenza Split 11/11/2010, 11/14/2011  . Influenza Whole 02/10/2009  . Influenza, High Dose Seasonal PF 11/04/2015  . Influenza,inj,Quad PF,6+ Mos 10/18/2012, 10/10/2013, 12/21/2014  . Influenza-Unspecified 08/30/2016  . Pneumococcal Conjugate-13 12/21/2014  . Pneumococcal Polysaccharide-23 09/04/2011  . Td 01/30/2001, 12/28/2015    Health Maintenance  Topic Date Due  . INFLUENZA VACCINE  08/30/2017  . MAMMOGRAM  08/17/2018  . COLONOSCOPY  05/19/2019  . TETANUS/TDAP  12/27/2025  . DEXA SCAN  Completed  . Hepatitis C Screening  Completed  . PNA vac Low Risk Adult  Completed    Lab Results  Component Value Date   WBC 6.3 06/14/2017   HGB 12.0 06/14/2017   HCT 36.9 06/14/2017   PLT 241.0 06/14/2017   GLUCOSE 100 (H) 06/14/2017   CHOL 161 06/14/2017   TRIG 168.0 (H) 06/14/2017   HDL 52.40 06/14/2017   LDLDIRECT 98.0 11/04/2015   LDLCALC 75 06/14/2017   ALT 25 06/14/2017   AST 25 06/14/2017   NA 148 (H) 06/14/2017   K 5.0 06/14/2017   CL 108 06/14/2017   CREATININE 0.83 06/14/2017   BUN 10 06/14/2017   CO2 34 (H) 06/14/2017   TSH 2.54 06/14/2017   INR 1.06 03/30/2016   HGBA1C 5.7 06/14/2017    Lab Results  Component Value Date   TSH 2.54 06/14/2017   Lab Results  Component Value Date   WBC 6.3 06/14/2017   HGB 12.0 06/14/2017   HCT 36.9 06/14/2017   MCV 90.6 06/14/2017   PLT 241.0 06/14/2017   Lab Results  Component Value Date   NA 148 (H) 06/14/2017   K 5.0 06/14/2017    CHLORIDE 107 08/07/2016   CO2 34 (H) 06/14/2017   GLUCOSE 100 (H) 06/14/2017   BUN 10 06/14/2017   CREATININE 0.83 06/14/2017   BILITOT 0.3 06/14/2017   ALKPHOS 85 06/14/2017   AST 25 06/14/2017   ALT 25 06/14/2017   PROT 6.8 06/14/2017   ALBUMIN 4.3 06/14/2017   CALCIUM 10.1 06/14/2017   ANIONGAP 7 06/07/2017   EGFR 72 (L) 08/07/2016   GFR 71.62 06/14/2017   Lab Results  Component Value Date   CHOL 161 06/14/2017   Lab Results  Component Value Date   HDL 52.40 06/14/2017   Lab Results  Component Value Date   LDLCALC 75 06/14/2017   Lab Results  Component Value Date   TRIG 168.0 (H) 06/14/2017   Lab Results  Component Value Date   CHOLHDL 3 06/14/2017   Lab Results  Component Value Date   HGBA1C 5.7 06/14/2017         Assessment & Plan:   Problem List Items Addressed This Visit  Hyperlipidemia, mixed    Encouraged heart healthy diet, increase exercise, avoid trans fats, consider a krill oil cap daily      Relevant Orders   Lipid panel (Completed)   Depression with anxiety    She is struggling to care for her adult son with special needs and her husband. She is encouraged to try and visit some nursing homes and try and figure out where she would like him placed if something were to make it impossible for her to care for him. She agrees. She will let us know if she feels she is in need of medications or further assistance for her stress. Spent 40 minutes in counseling with patient. We will increase her Venlafaxine does to 225      Relevant Medications   Venlafaxine HCl 225 MG TB24   Essential hypertension    On Levothyroxine, continue to monitor      Relevant Orders   CBC (Completed)   TSH (Completed)   Vitamin D deficiency    Check level      Relevant Orders   VITAMIN D 25 Hydroxy (Vit-D Deficiency, Fractures) (Completed)    Other Visit Diagnoses    Hyperglycemia    -  Primary   Relevant Orders   Hemoglobin A1c (Completed)   Comprehensive  metabolic panel (Completed)      I have discontinued Alycia Patten. Gaumond's venlafaxine XR. I am also having her start on Venlafaxine HCl. Additionally, I am having her maintain her hydrOXYzine, pentosan polysulfate, B Complex-C (SUPER B COMPLEX PO), acetaminophen, ferrous sulfate, multivitamin with minerals, carvedilol, nystatin, rosuvastatin, folic acid, clonazePAM, gabapentin, aspirin EC, and telmisartan.  Meds ordered this encounter  Medications  . Venlafaxine HCl 225 MG TB24    Sig: Take 1 tablet (225 mg total) by mouth daily.    Dispense:  30 each    Refill:  2    CMA served as scribe during this visit. History, Physical and Plan performed by medical provider. Documentation and orders reviewed and attested to.  Penni Homans, MD

## 2017-06-14 NOTE — Assessment & Plan Note (Signed)
Encouraged heart healthy diet, increase exercise, avoid trans fats, consider a krill oil cap daily 

## 2017-06-15 ENCOUNTER — Other Ambulatory Visit: Payer: Self-pay | Admitting: Family Medicine

## 2017-06-15 LAB — COMPREHENSIVE METABOLIC PANEL
ALT: 25 U/L (ref 0–35)
AST: 25 U/L (ref 0–37)
Albumin: 4.3 g/dL (ref 3.5–5.2)
Alkaline Phosphatase: 85 U/L (ref 39–117)
BILIRUBIN TOTAL: 0.3 mg/dL (ref 0.2–1.2)
BUN: 10 mg/dL (ref 6–23)
CO2: 34 meq/L — AB (ref 19–32)
CREATININE: 0.83 mg/dL (ref 0.40–1.20)
Calcium: 10.1 mg/dL (ref 8.4–10.5)
Chloride: 108 mEq/L (ref 96–112)
GFR: 71.62 mL/min (ref >60.00)
GLUCOSE: 100 mg/dL — AB (ref 70–99)
Potassium: 5 mEq/L (ref 3.5–5.1)
SODIUM: 148 meq/L — AB (ref 135–145)
Total Protein: 6.8 g/dL (ref 6.0–8.3)

## 2017-06-15 LAB — CBC
HEMATOCRIT: 36.9 % (ref 36.0–46.0)
Hemoglobin: 12 g/dL (ref 12.0–15.0)
MCHC: 32.5 g/dL (ref 30.0–36.0)
MCV: 90.6 fl (ref 78.0–100.0)
Platelets: 241 10*3/uL (ref 150.0–400.0)
RBC: 4.07 Mil/uL (ref 3.87–5.11)
RDW: 14.3 % (ref 11.5–15.5)
WBC: 6.3 10*3/uL (ref 4.0–10.5)

## 2017-06-15 LAB — LIPID PANEL
CHOL/HDL RATIO: 3
Cholesterol: 161 mg/dL (ref 0–200)
HDL: 52.4 mg/dL (ref >39.00)
LDL Cholesterol: 75 mg/dL (ref 0–99)
NONHDL: 108.27
Triglycerides: 168 mg/dL — ABNORMAL HIGH (ref 0.0–149.0)
VLDL: 33.6 mg/dL (ref 0.0–40.0)

## 2017-06-15 LAB — HEMOGLOBIN A1C: Hgb A1c MFr Bld: 5.7 % (ref 4.6–6.5)

## 2017-06-15 LAB — VITAMIN D 25 HYDROXY (VIT D DEFICIENCY, FRACTURES): VITD: 37.36 ng/mL (ref 30.00–100.00)

## 2017-06-15 LAB — TSH: TSH: 2.54 u[IU]/mL (ref 0.35–4.50)

## 2017-06-17 NOTE — Assessment & Plan Note (Addendum)
She is struggling to care for her adult son with special needs and her husband. She is encouraged to try and visit some nursing homes and try and figure out where she would like him placed if something were to make it impossible for her to care for him. She agrees. She will let us know if she feels she is in need of medications or further assistance for her stress. Spent 40 minutes in counseling with patient. We will increase her Venlafaxine does to 225

## 2017-06-18 DIAGNOSIS — I10 Essential (primary) hypertension: Secondary | ICD-10-CM

## 2017-06-26 ENCOUNTER — Other Ambulatory Visit: Payer: Medicare Other

## 2017-06-28 ENCOUNTER — Other Ambulatory Visit: Payer: Self-pay | Admitting: Emergency Medicine

## 2017-06-28 ENCOUNTER — Other Ambulatory Visit: Payer: Medicare Other

## 2017-06-28 DIAGNOSIS — I10 Essential (primary) hypertension: Secondary | ICD-10-CM

## 2017-06-28 NOTE — Progress Notes (Signed)
.  comp

## 2017-06-29 ENCOUNTER — Other Ambulatory Visit (INDEPENDENT_AMBULATORY_CARE_PROVIDER_SITE_OTHER): Payer: Medicare Other

## 2017-06-29 DIAGNOSIS — I10 Essential (primary) hypertension: Secondary | ICD-10-CM

## 2017-06-29 LAB — COMPREHENSIVE METABOLIC PANEL
ALBUMIN: 4.1 g/dL (ref 3.5–5.2)
ALK PHOS: 93 U/L (ref 39–117)
ALT: 20 U/L (ref 0–35)
AST: 20 U/L (ref 0–37)
BUN: 15 mg/dL (ref 6–23)
CO2: 30 mEq/L (ref 19–32)
CREATININE: 0.79 mg/dL (ref 0.40–1.20)
Calcium: 9.3 mg/dL (ref 8.4–10.5)
Chloride: 105 mEq/L (ref 96–112)
GFR: 75.81 mL/min (ref >60.00)
Glucose, Bld: 99 mg/dL (ref 70–99)
POTASSIUM: 4.1 meq/L (ref 3.5–5.1)
Sodium: 142 mEq/L (ref 135–145)
TOTAL PROTEIN: 6.4 g/dL (ref 6.0–8.3)
Total Bilirubin: 0.3 mg/dL (ref 0.2–1.2)

## 2017-07-19 ENCOUNTER — Other Ambulatory Visit: Payer: Self-pay | Admitting: Adult Health

## 2017-07-19 DIAGNOSIS — Z1231 Encounter for screening mammogram for malignant neoplasm of breast: Secondary | ICD-10-CM

## 2017-08-09 ENCOUNTER — Encounter: Payer: Self-pay | Admitting: Adult Health

## 2017-08-09 ENCOUNTER — Inpatient Hospital Stay: Payer: Medicare Other | Attending: Adult Health | Admitting: Adult Health

## 2017-08-09 ENCOUNTER — Telehealth: Payer: Self-pay | Admitting: Adult Health

## 2017-08-09 VITALS — BP 127/58 | HR 77 | Temp 98.4°F | Resp 17 | Ht 61.0 in | Wt 189.1 lb

## 2017-08-09 DIAGNOSIS — Z853 Personal history of malignant neoplasm of breast: Secondary | ICD-10-CM | POA: Insufficient documentation

## 2017-08-09 DIAGNOSIS — C50311 Malignant neoplasm of lower-inner quadrant of right female breast: Secondary | ICD-10-CM

## 2017-08-09 DIAGNOSIS — Z171 Estrogen receptor negative status [ER-]: Secondary | ICD-10-CM

## 2017-08-09 DIAGNOSIS — E2839 Other primary ovarian failure: Secondary | ICD-10-CM

## 2017-08-09 NOTE — Telephone Encounter (Signed)
Gave patient AVS and Calendar.  °

## 2017-08-09 NOTE — Progress Notes (Signed)
CLINIC:  Survivorship   REASON FOR VISIT:  Routine follow-up for history of breast cancer.   BRIEF ONCOLOGIC HISTORY:    Breast cancer of lower-inner quadrant of right female breast ([place])   04/2009 Initial Biopsy    Right breast core needle bx: IDC, ER/PR-, HER2/neu positive, Ki67 58%      04/2009 Clinical Stage    Stage IA: T1c No       - 08/2009 Neo-Adjuvant Chemotherapy    Taxol/ carbo/trastuzumab x 2, then Taxol/trastuzumab weekly x 3 (with last cycle with weekly carbo), A/C x 2 cycles with doxorubicin by continuous infusion (chemo poorly tolerated)      09/2009 Definitive Surgery    Right lumpectomy/SLNB: complete pathologic response       - 11/2009 Radiation Therapy    Adjuvant RT: right breast      09/2009 - 01/2010 Chemotherapy    Maintenance trastuzumab; discontinued due to drop in EF, which has since respolved        INTERVAL HISTORY:  Shannon [name] presents to the [facility] today for routine follow-up for her history of breast cancer.  Overall, she reports feeling quite well.   This year has been tough since her special needs son had a stroke and requires 24 hour care.  This is hard on her, being his main caregiver, but she enjoys taking care of him.    She follows with her PCP regularly.  She is up to date with her cancer screenings.    She gets very little exercise.    She does have back issues from working with her son.  This has caused compression fractures requiring kyphoplasty.  She thinks she needs another one, but cannot because she cannot bend over after the procedure.  She also has limited visual fields due to drooping eyelids and hasn't been able to have that surgery, due to inability to bend over.  She cannot not bend over due to needs from her son.     REVIEW OF SYSTEMS:  Review of Systems  Constitutional: Negative for appetite change, chills, fatigue, fever and unexpected weight change.  HENT:   Negative for hearing loss, lump/mass and  trouble swallowing.   Eyes: Negative for eye problems and icterus.  Respiratory: Negative for chest tightness, cough and shortness of breath.   Cardiovascular: Negative for chest pain, leg swelling and palpitations.  Gastrointestinal: Negative for abdominal distention, abdominal pain, constipation, diarrhea, nausea and vomiting.  Endocrine: Negative for hot flashes.  Skin: Negative for itching and rash.  Neurological: Negative for dizziness, extremity weakness and headaches.  Hematological: Negative for adenopathy. Does not bruise/bleed easily.  Psychiatric/Behavioral: Negative for depression. The patient is not nervous/anxious.   Breast: Denies any new nodularity, masses, tenderness, nipple changes, or nipple discharge.       PAST MEDICAL/SURGICAL HISTORY:  Past Medical History:  Diagnosis Date  . 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 Anemia 08/28/2011  . Asthma    as a child  . Brain tumor ([place])   . Breast cancer ([place])    right breast  . Carpal tunnel syndrome of left wrist   . Cataracts, bilateral 12/28/2015  . Constipation 03/03/2012  . DEPRESSION 08/11/2008  . Dermatitis   . Facial fracture ([place])   . GERD 08/11/2008  . H/O measles   . H/O mumps   . History of blood transfusion   . History of chicken pox   . History of hiatal hernia   . Hx breast  cancer, IDC, Right, 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/2009   BRCA 2 NEGATIVE, CHEMO AND RADIATION X 1 YR  . HYPERLIPIDEMIA 08/11/2008  . HYPERTENSION 08/11/2008  . Hyponatremia 08/28/2011  . 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 Interstitial cystitis   . LIPOMA 02/10/2009  . Medicare annual wellness visit, subsequent 12/27/2014   Follows with Dr Amalia Hailey of urology Follows with Dr Posey Pronto at [facility] for opthamology Follows with Dr [name] of dermatology Follows with [facility] gastroenterology, last scope done in 2011 repeat in 10 years Last [facility] July 2015 should repeat every 1-2 years Last Pap December 2015, repeat in 3 years.   .  Melanoma ([place]) 2008   DR. Ronnald Ramp  . Neuropathy   . NICM (nonischemic cardiomyopathy) ([place]) 03/08/2010   likely 2/2 chemotx - a. Echo 2012: EF 45-50%;  b. Lex MV 2/12:  low risk, apical defect (small area of ischemia vs shifting breast atten);  c.  Echo 7/12: Normal wall thickness, EF 60-65%, normal wall motion, grade 1 diastolic dysfunction, mild LAE, PASP 32;   d. Lex MV 11/13:  EF 76%, no ischemia  . OA (osteoarthritis) 09/07/2011  . Obesity 09/28/2014  . Osteoporosis   . Personal history of chemotherapy 2012  . Personal history of radiation therapy 2012  . PREDIABETES 06/15/2009  . Recurrent falls 12/28/2015  . Shortness of breath   . UTI (lower urinary tract infection) 03/03/2012  . Vertebral fracture, osteoporotic, sequela 04/05/2016   Past Surgical History:  Procedure Laterality Date  . BREAST LUMPECTOMY  04/2009   RIGHT FOR BREAST CANCER-CHEMO/RADIATION X 1 YEAR  . BREAST REDUCTION SURGERY Bilateral 05/04/2014   Procedure: MAMMARY REDUCTION  (BREAST);  Surgeon: Cristine Polio, MD;  Location: [facility];  Service: Plastics;  Laterality: Bilateral;  . CARPAL TUNNEL RELEASE     L wrist, ulnar nerve moved  . CHOLECYSTECTOMY    . CRANIOTOMY FOR TUMOR  2000  . ELBOW SURGERY     left  . KNEE ARTHROSCOPY Left 10/12/2014   Procedure: LEFT KNEE ARTHROSCOPY ;  Surgeon: Gaynelle Arabian, MD;  Location: [facility];  Service: Orthopedics;  Laterality: Left;  . KYPHOPLASTY N/A 03/30/2016   Procedure: THORACIC 12 KYPHOPLASTY;  Surgeon: Phylliss Bob, MD;  Location: [facility];  Service: Orthopedics;  Laterality: N/A;  THORACIC 12 KYPHOPLASTY  . LEFT HEART CATHETERIZATION WITH CORONARY ANGIOGRAM N/A 12/16/2013   Procedure: LEFT HEART CATHETERIZATION WITH CORONARY ANGIOGRAM;  Surgeon: Peter M [name], MD;  Location: [facility] CATH LAB;  Service: Cardiovascular;  Laterality: N/A;  . LIPOMA EXCISION  03/28/2009   right leg  . MELANOMA EXCISION    . PORT-A-CATH REMOVAL  11/30/2010   [place]reck  . porta cath    .  PORTACATH PLACEMENT  may 2011  . REDUCTION MAMMAPLASTY Bilateral   . SYNOVECTOMY Left 10/12/2014   Procedure: WITH SYNOVECTOMY;  Surgeon: Gaynelle Arabian, MD;  Location: [facility];  Service: Orthopedics;  Laterality: Left;  . TONSILLECTOMY  1958  . TOTAL KNEE ARTHROPLASTY  02/05/2012   Procedure: TOTAL KNEE ARTHROPLASTY;  Surgeon: Gearlean Alf, MD;  Location: [facility];  Service: Orthopedics;  Laterality: Right;  . TOTAL KNEE ARTHROPLASTY Left 02/10/2013   Procedure: LEFT TOTAL KNEE ARTHROPLASTY;  Surgeon: Gearlean Alf, MD;  Location: [facility];  Service: Orthopedics;  Laterality: Left;  . total knee raplacement  01-2012   Right Knee  . TUBAL LIGATION  1997     ALLERGIES:  Allergies  Allergen Reactions  . Dilaudid [Hydromorphone Hcl]  Itching     CURRENT MEDICATIONS:  Outpatient Encounter Medications as of 08/09/2017  Medication Sig  . acetaminophen (TYLENOL) 500 MG tablet Take 500-1,000 mg by mouth every 6 (six) hours as needed for moderate pain or headache.   Marland Kitchen aspirin EC 81 MG tablet Take 81 mg by mouth daily.  . B Complex-C (SUPER B COMPLEX PO) Take 1 tablet by mouth daily.  . carvedilol (COREG) 25 MG tablet Take 0.5 tablets (12.5 mg total) by mouth 2 (two) times daily with a meal. Hold if your blood pressure is <120/80  . carvedilol (COREG) 25 MG tablet TAKE 1 TABLET (25 MG TOTAL) BY MOUTH 2 (TWO) TIMES DAILY WITH A MEAL.  . clonazePAM (KLONOPIN) 0.5 MG tablet TAKE 1/2 TO 1 TABLET BY MOUTH 3 TIMES A DAY AS NEEDED FOR ANXIETY  . ferrous sulfate 325 (65 FE) MG tablet Take 325 mg by mouth daily with breakfast.  . folic acid (FOLVITE) 1 MG tablet TAKE 1 TABLET BY MOUTH EVERY DAY  . gabapentin (NEURONTIN) 300 MG capsule TAKE 1 CAPSULE BY MOUTH TWICE A DAY  . hydrOXYzine (ATARAX/VISTARIL) 25 MG tablet Take 25 mg by mouth at bedtime.  . Multiple Vitamin (MULTIVITAMIN WITH MINERALS) TABS tablet Take 1 tablet by mouth daily.  . Multiple Vitamins-Minerals (ZINC PO) Take by mouth daily.  Marland Kitchen  nystatin (MYCOSTATIN/NYSTOP) powder Apply topically 4 (four) times daily.  . pentosan polysulfate (ELMIRON) 100 MG capsule Take 100 mg by mouth 2 (two) times daily.   . rosuvastatin (CRESTOR) 20 MG tablet TAKE 1 TABLET (20 MG TOTAL) BY MOUTH AT BEDTIME.  Marland Kitchen telmisartan (MICARDIS) 80 MG tablet TAKE 1 TABLET BY MOUTH EVERY DAY (Patient taking differently: TAKE 1/2 TABLET BY MOUTH EVERY DAY)  . Venlafaxine HCl 225 MG TB24 TAKE 1 TABLET (225 MG TOTAL) BY MOUTH DAILY.   No facility-administered encounter medications on file as of 08/09/2017.      ONCOLOGIC FAMILY HISTORY:  Family History  Problem Relation Age of Onset  . Breast cancer Mother        sarcoma  . Lung cancer Mother   . Hypertension Mother   . Prostate cancer Father   . Congestive Heart Failure Father   . Heart attack Father   . Prostate cancer Brother   . Down syndrome Son   . Heart disease Maternal Grandfather        MI  . Stomach cancer Maternal Aunt   . Uterine cancer Maternal Aunt   . Colon cancer Neg Hx      SOCIAL HISTORY:  Social History   Socioeconomic History  . Marital status: Married    Spouse name: Not on file  . Number of children: 3  . Years of education: Not on file  . Highest education level: Not on file  Occupational History  . Occupation: Copywriter, advertising: [employer]  Social Needs  . Financial resource strain: Not on file  . Food insecurity:    Worry: Not on file    Inability: Not on file  . Transportation needs:    Medical: Not on file    Non-medical: Not on file  Tobacco Use  . Smoking status: Never Smoker  . Smokeless tobacco: Never Used  Substance and Sexual Activity  . Alcohol use: No  . Drug use: No  . Sexual activity: Never  Lifestyle  . Physical activity:    Days per week: Not on file    Minutes per session: Not on file  .  Stress: Not on file  Relationships  . Social connections:    Talks on phone: Not on file    Gets together: Not on file    Attends  religious service: Not on file    Active member of club or organization: Not on file    Attends meetings of clubs or organizations: Not on file    Relationship status: Not on file  . Intimate partner violence:    Fear of current or ex partner: Not on file    Emotionally abused: Not on file    Physically abused: Not on file    Forced sexual activity: Not on file  Other Topics Concern  . Not on file  Social History Narrative  . Not on file      PHYSICAL EXAMINATION:  Vital Signs: Vitals:   08/09/17 1106  BP: (!) 127/58  Pulse: 77  Resp: 17  Temp: 98.4 F (36.9 C)  SpO2: 97%   Filed Weights   08/09/17 1106  Weight: 189 lb 1.6 oz (85.8 kg)   General: Well-nourished, well-appearing female in no acute distress.  Unaccompanied today.   HEENT: Head is normocephalic.  Pupils equal and reactive to light. Conjunctivae clear without exudate.  Sclerae anicteric. Oral mucosa is pink, moist.  Oropharynx is pink without lesions or erythema.  Lymph: No cervical, supraclavicular, or infraclavicular lymphadenopathy noted on palpation.  Cardiovascular: Regular rate and rhythm.Marland Kitchen Respiratory: Clear to auscultation bilaterally. Chest expansion symmetric; breathing non-labored.  Breast Exam:  -Left breast: No appreciable masses on palpation. No skin redness, thickening, or peau d'orange appearance; no nipple retraction or nipple discharge;  -Right breast: No appreciable masses on palpation. No skin redness, thickening, or peau d'orange appearance; no nipple retraction or nipple discharge; mild distortion in symmetry at previous lumpectomy site well healed scar without erythema or nodularity. -Axilla: No axillary adenopathy bilaterally.  GI: Abdomen soft and round; non-tender, non-distended. Bowel sounds normoactive. No hepatosplenomegaly.   GU: Deferred.  Neuro: No focal deficits. Steady gait.  Psych: Mood and affect normal and appropriate for situation.  MSK: No focal spinal tenderness to  palpation, full range of motion in bilateral upper extremities Extremities: No edema. Skin: Warm and dry.  LABORATORY DATA:  None for this visit   DIAGNOSTIC IMAGING:  Most recent mammogram:     ASSESSMENT AND PLAN:  Ms.. [name] is a pleasant 72 y.o. female with history of Stage IA right breast invasive ductal carcinoma, ER-/PR-/HER2+, diagnosed in 04/2009, treated with neo-adjuvant chemotherapy, maintenance trastuzumab, lumpectomy, and adjuvant radiation therapy.  [facility] for surveillance and routine follow-up.   1. History of breast cancer:  Shannon [name] is currently clinically and radiographically without evidence of disease or recurrence of breast cancer. She will be due for mammogram in 07/2017.   We will see her back in one year for LTS follow up I encouraged her to call me with any questions or concerns before her next visit at the cancer center, and I would be happy to see her sooner, if needed.    2. Bone health:  Given Shannon [name]'s age, history of breast cancer, she is at risk for bone demineralization. Her last DEXA scan was in 01/2014 consistent with osteopenia right femur, with a t score of -2.4. She is due for repeat bone density testing and would like this done when she has her mammogram done at the breast center later this month.  I have placed those orders today.  I recommended to her  that she call the breast center to arrange having the bone density done when her mammogram is done.  She was given education on specific food and activities to promote bone health.  3. Cancer screening:  Due to Shannon [name]'s history and her age, she should receive screening for skin cancers, colon cancer, and gynecologic cancers. She was encouraged to follow-up with her PCP for appropriate cancer screenings.   4. Health maintenance and wellness promotion: Shannon [name] was encouraged to consume 5-7 servings of fruits and vegetables per day. She was also encouraged to engage in  moderate to vigorous exercise for 30 minutes per day most days of the week. She was instructed to limit her alcohol consumption and continue to abstain from tobacco use. I encouraged her to f/u about her health concerns with her PCP (back issues)    Dispo:  -Return to cancer center in one year for LTS follow up -Mammogram in 07/2017 -Bone density in 07/2017   A total of (30) minutes of face-to-face time was spent with this patient with greater than 50% of that time in counseling and care-coordination.   Gardenia Phlegm, NP [facility] [place] [phone]   Note: PRIMARY CARE PROVIDER Mosie Lukes, [place] [phone]

## 2017-08-09 NOTE — Patient Instructions (Signed)
Bone Health Bones protect organs, store calcium, and anchor muscles. Good health habits, such as eating nutritious foods and exercising regularly, are important for maintaining healthy bones. They can also help to prevent a condition that causes bones to lose density and become weak and brittle (osteoporosis). Why is bone mass important? Bone mass refers to the amount of bone tissue that you have. The higher your bone mass, the stronger your bones. An important step toward having healthy bones throughout life is to have strong and dense bones during childhood. A young adult who has a high bone mass is more likely to have a high bone mass later in life. Bone mass at its greatest it is called peak bone mass. A large decline in bone mass occurs in older adults. In women, it occurs about the time of menopause. During this time, it is important to practice good health habits, because if more bone is lost than what is replaced, the bones will become less healthy and more likely to break (fracture). If you find that you have a low bone mass, you may be able to prevent osteoporosis or further bone loss by changing your diet and lifestyle. How can I find out if my bone mass is low? Bone mass can be measured with an X-ray test that is called a bone mineral density (BMD) test. This test is recommended for all women who are age 65 or older. It may also be recommended for men who are age 70 or older, or for people who are more likely to develop osteoporosis due to:  Having bones that break easily.  Having a long-term disease that weakens bones, such as kidney disease or rheumatoid arthritis.  Having menopause earlier than normal.  Taking medicine that weakens bones, such as steroids, thyroid hormones, or hormone treatment for breast cancer or prostate cancer.  Smoking.  Drinking three or more alcoholic drinks each day.  What are the nutritional recommendations for healthy bones? To have healthy bones, you  need to get enough of the right minerals and vitamins. Most nutrition experts recommend getting these nutrients from the foods that you eat. Nutritional recommendations vary from person to person. Ask your health care provider what is healthy for you. Here are some general guidelines. Calcium Recommendations Calcium is the most important (essential) mineral for bone health. Most people can get enough calcium from their diet, but supplements may be recommended for people who are at risk for osteoporosis. Good sources of calcium include:  Dairy products, such as low-fat or nonfat milk, cheese, and yogurt.  Dark green leafy vegetables, such as bok choy and broccoli.  Calcium-fortified foods, such as orange juice, cereal, bread, soy beverages, and tofu products.  Nuts, such as almonds.  Follow these recommended amounts for daily calcium intake:  Children, age 1?3: 700 mg.  Children, age 4?8: 1,000 mg.  Children, age 9?13: 1,300 mg.  Teens, age 14?18: 1,300 mg.  Adults, age 19?50: 1,000 mg.  Adults, age 51?70: ? Men: 1,000 mg. ? Women: 1,200 mg.  Adults, age 71 or older: 1,200 mg.  Pregnant and breastfeeding females: ? Teens: 1,300 mg. ? Adults: 1,000 mg.  Vitamin D Recommendations Vitamin D is the most essential vitamin for bone health. It helps the body to absorb calcium. Sunlight stimulates the skin to make vitamin D, so be sure to get enough sunlight. If you live in a cold climate or you do not get outside often, your health care provider may recommend that you take vitamin   D supplements. Good sources of vitamin D in your diet include:  Egg yolks.  Saltwater fish.  Milk and cereal fortified with vitamin D.  Follow these recommended amounts for daily vitamin D intake:  Children and teens, age 1?18: 600 international units.  Adults, age 50 or younger: 400-800 international units.  Adults, age 51 or older: 800-1,000 international units.  Other Nutrients Other nutrients  for bone health include:  Phosphorus. This mineral is found in meat, poultry, dairy foods, nuts, and legumes. The recommended daily intake for adult men and adult women is 700 mg.  Magnesium. This mineral is found in seeds, nuts, dark green vegetables, and legumes. The recommended daily intake for adult men is 400?420 mg. For adult women, it is 310?320 mg.  Vitamin K. This vitamin is found in green leafy vegetables. The recommended daily intake is 120 mg for adult men and 90 mg for adult women.  What type of physical activity is best for building and maintaining healthy bones? Weight-bearing and strength-building activities are important for building and maintaining peak bone mass. Weight-bearing activities cause muscles and bones to work against gravity. Strength-building activities increases muscle strength that supports bones. Weight-bearing and muscle-building activities include:  Walking and hiking.  Jogging and running.  Dancing.  Gym exercises.  Lifting weights.  Tennis and racquetball.  Climbing stairs.  Aerobics.  Adults should get at least 30 minutes of moderate physical activity on most days. Children should get at least 60 minutes of moderate physical activity on most days. Ask your health care provide what type of exercise is best for you. Where can I find more information? For more information, check out the following websites:  National Osteoporosis Foundation: http://nof.org/learn/basics  National Institutes of Health: http://www.niams.nih.gov/Health_Info/Bone/Bone_Health/bone_health_for_life.asp  This information is not intended to replace advice given to you by your health care provider. Make sure you discuss any questions you have with your health care provider. Document Released: 04/08/2003 Document Revised: 08/06/2015 Document Reviewed: 01/21/2014 Elsevier Interactive Patient Education  2018 [employer]vier Inc.  

## 2017-08-14 ENCOUNTER — Ambulatory Visit (INDEPENDENT_AMBULATORY_CARE_PROVIDER_SITE_OTHER): Payer: Medicare Other | Admitting: Family Medicine

## 2017-08-14 ENCOUNTER — Encounter: Payer: Self-pay | Admitting: Family Medicine

## 2017-08-14 VITALS — BP 108/70 | HR 69 | Temp 98.1°F | Resp 18 | Wt 189.4 lb

## 2017-08-14 DIAGNOSIS — I10 Essential (primary) hypertension: Secondary | ICD-10-CM | POA: Diagnosis not present

## 2017-08-14 DIAGNOSIS — E669 Obesity, unspecified: Secondary | ICD-10-CM | POA: Diagnosis not present

## 2017-08-14 DIAGNOSIS — F418 Other specified anxiety disorders: Secondary | ICD-10-CM | POA: Diagnosis not present

## 2017-08-14 DIAGNOSIS — E559 Vitamin D deficiency, unspecified: Secondary | ICD-10-CM

## 2017-08-14 DIAGNOSIS — F329 Major depressive disorder, single episode, unspecified: Secondary | ICD-10-CM

## 2017-08-14 DIAGNOSIS — F32A Depression, unspecified: Secondary | ICD-10-CM

## 2017-08-14 MED ORDER — VENLAFAXINE HCL ER 75 MG PO CP24: 225.0000 mg | ORAL_CAPSULE | Freq: Every day | ORAL | 3 refills | Status: DC

## 2017-08-14 NOTE — Assessment & Plan Note (Signed)
Supplement and monitor 

## 2017-08-14 NOTE — Assessment & Plan Note (Signed)
Encouraged DASH diet, decrease po intake and increase exercise as tolerated. Needs 7-8 hours of sleep nightly. Avoid trans fats, eat small, frequent meals every 4-5 hours with lean proteins, complex carbs and healthy fats. Minimize simple carbs 

## 2017-08-14 NOTE — Patient Instructions (Addendum)
[employer] for Shannon [name] I think you have to switch in the fall  Consider a skilled nursing facility and see if you can find one you are willing accept.  Cn cut the Gabapentin to once daily and if no pain flare then drop to one every third day then stop Hypertension Hypertension, commonly called high blood pressure, is when the force of blood pumping through the arteries is too strong. The arteries are the blood vessels that carry blood from the heart throughout the body. Hypertension forces the heart to work harder to pump blood and may cause arteries to become narrow or stiff. Having untreated or uncontrolled hypertension can cause heart attacks, strokes, kidney disease, and other problems. A blood pressure reading consists of a higher number over a lower number. Ideally, your blood pressure should be below 120/80. The first ("top") number is called the systolic pressure. It is a measure of the pressure in your arteries as your heart beats. The second ("bottom") number is called the diastolic pressure. It is a measure of the pressure in your arteries as the heart relaxes. What are the causes? The cause of this condition is not known. What increases the risk? Some risk factors for high blood pressure are under your control. Others are not. Factors you can change  Smoking.  Having type 2 diabetes mellitus, high cholesterol, or both.  Not getting enough exercise or physical activity.  Being overweight.  Having too much fat, sugar, calories, or salt (sodium) in your diet.  Drinking too much alcohol. Factors that are difficult or impossible to change  Having chronic kidney disease.  Having a family history of high blood pressure.  Age. Risk increases with age.  Race. You may be at higher risk if you are African-American.  Gender. Men are at higher risk than women before age 76. After age 65, women are at higher risk than men.  Having obstructive sleep apnea.  Stress. What  are the signs or symptoms? Extremely high blood pressure (hypertensive crisis) may cause:  Headache.  Anxiety.  Shortness of breath.  Nosebleed.  Nausea and vomiting.  Severe chest pain.  Jerky movements you cannot control (seizures).  How is this diagnosed? This condition is diagnosed by measuring your blood pressure while you are seated, with your arm resting on a surface. The cuff of the blood pressure monitor will be placed directly against the skin of your upper arm at the level of your heart. It should be measured at least twice using the same arm. Certain conditions can cause a difference in blood pressure between your right and left arms. Certain factors can cause blood pressure readings to be lower or higher than normal (elevated) for a short period of time:  When your blood pressure is higher when you are in a health care provider's office than when you are at home, this is called white coat hypertension. Most people with this condition do not need medicines.  When your blood pressure is higher at home than when you are in a health care provider's office, this is called masked hypertension. Most people with this condition may need medicines to control blood pressure.  If you have a high blood pressure reading during one visit or you have normal blood pressure with other risk factors:  You may be asked to return on a different day to have your blood pressure checked again.  You may be asked to monitor your blood pressure at home for 1 week or longer.  If you are diagnosed with hypertension, you may have other blood or imaging tests to help your health care provider understand your overall risk for other conditions. How is this treated? This condition is treated by making healthy lifestyle changes, such as eating healthy foods, exercising more, and reducing your alcohol intake. Your health care provider may prescribe medicine if lifestyle changes are not enough to get your  blood pressure under control, and if:  Your systolic blood pressure is above 130.  Your diastolic blood pressure is above 80.  Your personal target blood pressure may vary depending on your medical conditions, your age, and other factors. Follow these instructions at home: Eating and drinking  Eat a diet that is high in fiber and potassium, and low in sodium, added sugar, and fat. An example eating plan is called the DASH (Dietary Approaches to Stop Hypertension) diet. To eat this way: ? Eat plenty of fresh fruits and vegetables. Try to fill half of your plate at each meal with fruits and vegetables. ? Eat whole grains, such as whole wheat pasta, brown rice, or whole grain bread. Fill about one quarter of your plate with whole grains. ? Eat or drink low-fat dairy products, such as skim milk or low-fat yogurt. ? Avoid fatty cuts of meat, processed or cured meats, and poultry with skin. Fill about one quarter of your plate with lean proteins, such as fish, chicken without skin, beans, eggs, and tofu. ? Avoid premade and processed foods. These tend to be higher in sodium, added sugar, and fat.  Reduce your daily sodium intake. Most people with hypertension should eat less than 1,500 mg of sodium a day.  Limit alcohol intake to no more than 1 drink a day for nonpregnant women and 2 drinks a day for men. One drink equals 12 oz of beer, 5 oz of wine, or 1 oz of hard liquor. Lifestyle  Work with your health care provider to maintain a healthy body weight or to lose weight. Ask what an ideal weight is for you.  Get at least 30 minutes of exercise that causes your heart to beat faster (aerobic exercise) most days of the week. Activities may include walking, swimming, or biking.  Include exercise to strengthen your muscles (resistance exercise), such as pilates or lifting weights, as part of your weekly exercise routine. Try to do these types of exercises for 30 minutes at least 3 days a week.  Do  not use any products that contain nicotine or tobacco, such as cigarettes and e-cigarettes. If you need help quitting, ask your health care provider.  Monitor your blood pressure at home as told by your health care provider.  Keep all follow-up visits as told by your health care provider. This is important. Medicines  Take over-the-counter and prescription medicines only as told by your health care provider. Follow directions carefully. Blood pressure medicines must be taken as prescribed.  Do not skip doses of blood pressure medicine. Doing this puts you at risk for problems and can make the medicine less effective.  Ask your health care provider about side effects or reactions to medicines that you should watch for. Contact a health care provider if:  You think you are having a reaction to a medicine you are taking.  You have headaches that keep coming back (recurring).  You feel dizzy.  You have swelling in your ankles.  You have trouble with your vision. Get help right away if:  You develop a severe headache  or confusion.  You have unusual weakness or numbness.  You feel faint.  You have severe pain in your chest or abdomen.  You vomit repeatedly.  You have trouble breathing. Summary  Hypertension is when the force of blood pumping through your arteries is too strong. If this condition is not controlled, it may put you at risk for serious complications.  Your personal target blood pressure may vary depending on your medical conditions, your age, and other factors. For most people, a normal blood pressure is less than 120/80.  Hypertension is treated with lifestyle changes, medicines, or a combination of both. Lifestyle changes include weight loss, eating a healthy, low-sodium diet, exercising more, and limiting alcohol. This information is not intended to replace advice given to you by your health care provider. Make sure you discuss any questions you have with your  health care provider. Document Released: 01/16/2005 Document Revised: 12/15/2015 Document Reviewed: 12/15/2015 Elsevier Interactive Patient Education  [employer].

## 2017-08-14 NOTE — Progress Notes (Signed)
Subjective:  I acted as a Education administrator for Dr. Charlett Blake. [name], [place]  Patient ID: Shannon [name], female    DOB: 23-Mar-1945, 72 y.o.   MRN: [id number]  No chief complaint on file.   HPI  Patient is in today for 2 month follow up and she is notably tearful today.  She is exhausted from caring for her son at home who has always been special-needs but is now also developing dementia and has recently had a stroke.  He is now full care and she has no significant help.  She gets very little sleep or assistance in his care.  She does not want to place him in a home but she is exhausted at this time.  She endorses anhedonia, fatigue chronic pain but denies suicidal or homicidal ideation. Denies CP/palp/SOB/HA/congestion/fevers/GI or GU c/o. Taking meds as prescribed  Patient Care Team: Mosie Lukes, MD as PCP - General (Family Medicine) Domingo Pulse, MD as Consulting Physician (Urology) Calvert Cantor, MD as Consulting Physician (Ophthalmology) Stanford Breed Denice Bors, MD as Consulting Physician (Cardiology)   Past Medical History:  Diagnosis Date  . 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 Anemia 08/28/2011  . Asthma    as a child  . Brain tumor ([place])   . Breast cancer ([place])    right breast  . Carpal tunnel syndrome of left wrist   . Cataracts, bilateral 12/28/2015  . Constipation 03/03/2012  . DEPRESSION 08/11/2008  . Dermatitis   . Facial fracture ([place])   . GERD 08/11/2008  . H/O measles   . H/O mumps   . History of blood transfusion   . History of chicken pox   . History of hiatal hernia   . 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 HYPERLIPIDEMIA 08/11/2008  . HYPERTENSION 08/11/2008  . Hyponatremia 08/28/2011  . 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 Interstitial cystitis   . LIPOMA 02/10/2009  . Medicare annual wellness visit, subsequent 12/27/2014   Follows with Dr Amalia Hailey of urology Follows with Dr Posey Pronto at  [facility] for opthamology Follows with Dr [name] of dermatology Follows with [facility] gastroenterology, last scope done in 2011 repeat in 10 years Last [facility] July 2015 should repeat every 1-2 years Last Pap December 2015, repeat in 3 years.   . Melanoma ([place]) 2008   DR. Ronnald Ramp  . Neuropathy   . NICM (nonischemic cardiomyopathy) ([place]) 03/08/2010   likely 2/2 chemotx - a. Echo 2012: EF 45-50%;  b. Lex MV 2/12:  low risk, apical defect (small area of ischemia vs shifting breast atten);  c.  Echo 7/12: Normal wall thickness, EF 60-65%, normal wall motion, grade 1 diastolic dysfunction, mild LAE, PASP 32;   d. Lex MV 11/13:  EF 76%, no ischemia  . OA (osteoarthritis) 09/07/2011  . Obesity 09/28/2014  . Osteoporosis   . Personal history of chemotherapy 2012  . Personal history of radiation therapy 2012  . PREDIABETES 06/15/2009  . Recurrent falls 12/28/2015  . Shortness of breath   . UTI (lower urinary tract infection) 03/03/2012  . Vertebral fracture, osteoporotic, sequela 04/05/2016    Past Surgical History:  Procedure Laterality Date  . BREAST LUMPECTOMY  04/2009   RIGHT FOR BREAST CANCER-CHEMO/RADIATION X 1 YEAR  . BREAST REDUCTION SURGERY Bilateral 05/04/2014   Procedure: MAMMARY REDUCTION  (BREAST);  Surgeon: Cristine Polio, MD;  Location: [facility];  Service: Clinical cytogeneticist;  Laterality: Bilateral;  . CARPAL TUNNEL RELEASE     L wrist, ulnar nerve moved  . CHOLECYSTECTOMY    . CRANIOTOMY FOR TUMOR  2000  . ELBOW SURGERY     left  . KNEE ARTHROSCOPY Left 10/12/2014   Procedure: LEFT KNEE ARTHROSCOPY ;  Surgeon: Gaynelle Arabian, MD;  Location: [facility];  Service: Orthopedics;  Laterality: Left;  . KYPHOPLASTY N/A 03/30/2016   Procedure: THORACIC 12 KYPHOPLASTY;  Surgeon: Phylliss Bob, MD;  Location: [facility];  Service: Orthopedics;  Laterality: N/A;  THORACIC 12 KYPHOPLASTY  . LEFT HEART CATHETERIZATION WITH CORONARY ANGIOGRAM N/A 12/16/2013   Procedure: LEFT HEART CATHETERIZATION WITH CORONARY  ANGIOGRAM;  Surgeon: Peter M [name], MD;  Location: [facility] CATH [facility];  Service: Cardiovascular;  Laterality: N/A;  . LIPOMA EXCISION  03/28/2009   right leg  . MELANOMA EXCISION    . PORT-A-CATH REMOVAL  11/30/2010   Streck  . porta cath    . PORTACATH PLACEMENT  may 2011  . REDUCTION MAMMAPLASTY Bilateral   . SYNOVECTOMY Left 10/12/2014   Procedure: WITH SYNOVECTOMY;  Surgeon: Gaynelle Arabian, MD;  Location: [facility];  Service: Orthopedics;  Laterality: Left;  . TONSILLECTOMY  1958  . TOTAL KNEE ARTHROPLASTY  02/05/2012   Procedure: TOTAL KNEE ARTHROPLASTY;  Surgeon: Gearlean Alf, MD;  Location: [facility];  Service: Orthopedics;  Laterality: Right;  . TOTAL KNEE ARTHROPLASTY Left 02/10/2013   Procedure: LEFT TOTAL KNEE ARTHROPLASTY;  Surgeon: Gearlean Alf, MD;  Location: [facility];  Service: Orthopedics;  Laterality: Left;  . total knee raplacement  01-2012   Right Knee  . TUBAL LIGATION  1997    Family History  Problem Relation Age of Onset  . Breast cancer Mother        sarcoma  . Lung cancer Mother   . Hypertension Mother   . Prostate cancer Father   . Congestive Heart Failure Father   . Heart attack Father   . Prostate cancer Brother   . Down syndrome Son   . Heart disease Maternal Grandfather        MI  . Stomach cancer Maternal Aunt   . Uterine cancer Maternal Aunt   . Colon cancer Neg Hx     Social History   Socioeconomic History  . Marital status: Married    Spouse name: Not on file  . Number of children: 3  . Years of education: Not on file  . Highest education level: Not on file  Occupational History  . Occupation: Copywriter, advertising: [employer]  Social Needs  . Financial resource strain: Not on file  . Food insecurity:    Worry: Not on file    Inability: Not on file  . Transportation needs:    Medical: Not on file    Non-medical: Not on file  Tobacco Use  . Smoking status: Never Smoker  . Smokeless tobacco: Never Used  Substance and Sexual  Activity  . Alcohol use: No  . Drug use: No  . Sexual activity: Never  Lifestyle  . Physical activity:    Days per week: Not on file    Minutes per session: Not on file  . Stress: Not on file  Relationships  . Social connections:    Talks on phone: Not on file    Gets together: Not on file    Attends religious service: Not on file    Active member of club or organization:  Not on file    Attends meetings of clubs or organizations: Not on file    Relationship status: Not on file  . Intimate partner violence:    Fear of current or ex partner: Not on file    Emotionally abused: Not on file    Physically abused: Not on file    Forced sexual activity: Not on file  Other Topics Concern  . Not on file  Social History Narrative  . Not on file    Outpatient Medications Prior to Visit  Medication Sig Dispense Refill  . acetaminophen (TYLENOL) 500 MG tablet Take 500-1,000 mg by mouth every 6 (six) hours as needed for moderate pain or headache.     Marland Kitchen aspirin EC 81 MG tablet Take 81 mg by mouth daily.    . B Complex-C (SUPER B COMPLEX PO) Take 1 tablet by mouth daily.    . carvedilol (COREG) 25 MG tablet Take 0.5 tablets (12.5 mg total) by mouth 2 (two) times daily with a meal. Hold if your blood pressure is <120/80 30 tablet 0  . clonazePAM (KLONOPIN) 0.5 MG tablet TAKE 1/2 TO 1 TABLET BY MOUTH 3 TIMES A DAY AS NEEDED FOR ANXIETY 60 tablet 2  . ferrous sulfate 325 (65 FE) MG tablet Take 325 mg by mouth daily with breakfast.    . folic acid (FOLVITE) 1 MG tablet TAKE 1 TABLET BY MOUTH EVERY DAY 90 tablet 3  . hydrOXYzine (ATARAX/VISTARIL) 25 MG tablet Take 25 mg by mouth at bedtime.    . Multiple Vitamin (MULTIVITAMIN WITH MINERALS) TABS tablet Take 1 tablet by mouth daily.    Marland Kitchen nystatin (MYCOSTATIN/NYSTOP) powder Apply topically 4 (four) times daily. 15 g 0  . pentosan polysulfate (ELMIRON) 100 MG capsule Take 100 mg by mouth 2 (two) times daily.     . rosuvastatin (CRESTOR) 20 MG tablet  TAKE 1 TABLET (20 MG TOTAL) BY MOUTH AT BEDTIME. 90 tablet 3  . telmisartan (MICARDIS) 80 MG tablet TAKE 1 TABLET BY MOUTH EVERY DAY (Patient taking differently: TAKE 1/2 TABLET BY MOUTH EVERY DAY) 90 tablet 1  . carvedilol (COREG) 25 MG tablet TAKE 1 TABLET (25 MG TOTAL) BY MOUTH 2 (TWO) TIMES DAILY WITH A MEAL. 180 tablet 1  . gabapentin (NEURONTIN) 300 MG capsule TAKE 1 CAPSULE BY MOUTH TWICE A DAY 180 capsule 1  . Multiple Vitamins-Minerals (ZINC PO) Take by mouth daily.    . Venlafaxine HCl 225 MG TB24 TAKE 1 TABLET (225 MG TOTAL) BY MOUTH DAILY. 90 tablet 1   No facility-administered medications prior to visit.     Allergies  Allergen Reactions  . Dilaudid [Hydromorphone Hcl] Itching    Review of Systems  Constitutional: Positive for malaise/fatigue. Negative for fever.  HENT: Negative for congestion.   Eyes: Negative for blurred vision.  Respiratory: Negative for shortness of breath.   Cardiovascular: Negative for chest pain, palpitations and leg swelling.  Gastrointestinal: Negative for abdominal pain, blood in stool and nausea.  Genitourinary: Negative for dysuria and frequency.  Musculoskeletal: Positive for back pain. Negative for falls.  Skin: Negative for rash.  Neurological: Negative for dizziness, loss of consciousness and headaches.  Endo/Heme/Allergies: Negative for environmental allergies.  Psychiatric/Behavioral: Positive for depression. The patient is nervous/anxious.        Objective:    Physical Exam  Constitutional: She is oriented to person, place, and time. She appears well-developed and well-nourished. No distress.  HENT:  Head: Normocephalic and atraumatic.  Nose: Nose normal.  Eyes: Right eye exhibits no discharge. Left eye exhibits no discharge.  Neck: Normal range of motion. Neck supple.  Cardiovascular: Normal rate and regular rhythm.  No murmur heard. Pulmonary/Chest: Effort normal and breath sounds normal.  Abdominal: Soft. Bowel sounds are  normal. There is no tenderness.  Musculoskeletal: She exhibits no edema.  Neurological: She is alert and oriented to person, place, and time.  Skin: Skin is warm and dry.  Psychiatric: She has a normal mood and affect.  Nursing note and vitals reviewed.   BP 108/70 (BP Location: Left Arm, Patient Position: Sitting, Cuff Size: Normal)   Pulse 69   Temp 98.1 F (36.7 C) (Oral)   Resp 18   Wt 189 lb 6.4 oz (85.9 kg)   LMP 01/30/1994   SpO2 97%   BMI 35.79 kg/m  Wt Readings from Last 3 Encounters:  08/14/17 189 lb 6.4 oz (85.9 kg)  08/09/17 189 lb 1.6 oz (85.8 kg)  06/14/17 184 lb 9.6 oz (83.7 kg)   BP Readings from Last 3 Encounters:  08/14/17 108/70  08/09/17 (!) 127/58  06/14/17 140/66     Immunization History  Administered Date(s) Administered  . Influenza Split 11/11/2010, 11/14/2011  . Influenza Whole 02/10/2009  . Influenza, High Dose Seasonal PF 11/04/2015  . Influenza,inj,Quad PF,6+ Mos 10/18/2012, 10/10/2013, 12/21/2014  . Influenza-Unspecified 08/30/2016  . Pneumococcal Conjugate-13 12/21/2014  . Pneumococcal Polysaccharide-23 09/04/2011  . Td 01/30/2001, 12/28/2015    Health Maintenance  Topic Date Due  . INFLUENZA VACCINE  08/30/2017  . MAMMOGRAM  08/17/2018  . COLONOSCOPY  05/19/2019  . TETANUS/TDAP  12/27/2025  . DEXA SCAN  Completed  . Hepatitis C Screening  Completed  . PNA vac Low Risk Adult  Completed    Lab Results  Component Value Date   WBC 6.3 06/14/2017   HGB 12.0 06/14/2017   HCT 36.9 06/14/2017   PLT 241.0 06/14/2017   GLUCOSE 99 06/29/2017   CHOL 161 06/14/2017   TRIG 168.0 (H) 06/14/2017   HDL 52.40 06/14/2017   LDLDIRECT 98.0 11/04/2015   LDLCALC 75 06/14/2017   ALT 20 06/29/2017   AST 20 06/29/2017   NA 142 06/29/2017   K 4.1 06/29/2017   CL 105 06/29/2017   CREATININE 0.79 06/29/2017   BUN 15 06/29/2017   CO2 30 06/29/2017   TSH 2.54 06/14/2017   INR 1.06 03/30/2016   HGBA1C 5.7 06/14/2017    Lab Results    Component Value Date   TSH 2.54 06/14/2017   Lab Results  Component Value Date   WBC 6.3 06/14/2017   HGB 12.0 06/14/2017   HCT 36.9 06/14/2017   MCV 90.6 06/14/2017   PLT 241.0 06/14/2017   Lab Results  Component Value Date   NA 142 06/29/2017   K 4.1 06/29/2017   CHLORIDE 107 08/07/2016   CO2 30 06/29/2017   GLUCOSE 99 06/29/2017   BUN 15 06/29/2017   CREATININE 0.79 06/29/2017   BILITOT 0.3 06/29/2017   ALKPHOS 93 06/29/2017   AST 20 06/29/2017   ALT 20 06/29/2017   PROT 6.4 06/29/2017   ALBUMIN 4.1 06/29/2017   CALCIUM 9.3 06/29/2017   ANIONGAP 7 06/07/2017   EGFR 72 (L) 08/07/2016   GFR 75.81 06/29/2017   Lab Results  Component Value Date   CHOL 161 06/14/2017   Lab Results  Component Value Date   HDL 52.40 06/14/2017   Lab Results  Component Value Date   LDLCALC 75 06/14/2017   Lab Results  Component Value Date   TRIG 168.0 (H) 06/14/2017   Lab Results  Component Value Date   CHOLHDL 3 06/14/2017   Lab Results  Component Value Date   HGBA1C 5.7 06/14/2017         Assessment & Plan:   Problem List Items Addressed This Visit    Depression with anxiety    She tearful and stressed regarding the care of her disabled son whom she still cares for herself.  She is exhausted and has limited resources to help with his care.  She agrees to counseling and referral was placed.  She declines medications.  She acknowledges anhedonia and fatigue but does not endorse any suicidal or homicidal ideation.  She is not interested in placement for him at this time but does agree to at least look at facilities in the area to see if she can find when she is comfortable with in the event something happened to her.  Is counseled for 40 minutes of a 45-minute visit.      Relevant Medications   venlafaxine XR (EFFEXOR XR) 75 MG 24 hr capsule   Essential hypertension    Well controlled, no changes to meds. Encouraged heart healthy diet such as the DASH diet and exercise  as tolerated.       Obesity    Encouraged DASH diet, decrease po intake and increase exercise as tolerated. Needs 7-8 hours of sleep nightly. Avoid trans fats, eat small, frequent meals every 4-5 hours with lean proteins, complex carbs and healthy fats. Minimize simple carbs      Vitamin D deficiency    Supplement and monitor       Other Visit Diagnoses    Depression, unspecified depression type    -  Primary   Relevant Medications   venlafaxine XR (EFFEXOR XR) 75 MG 24 hr capsule   Other Relevant Orders   Ambulatory referral to [facility]      I have discontinued Alycia Patten. Altamura's Venlafaxine HCl and Multiple Vitamins-Minerals (ZINC PO). I am also having her start on venlafaxine XR. Additionally, I am having her maintain her hydrOXYzine, pentosan polysulfate, B Complex-C (SUPER B COMPLEX PO), acetaminophen, ferrous sulfate, multivitamin with minerals, carvedilol, nystatin, rosuvastatin, folic acid, clonazePAM, aspirin EC, and telmisartan.  Meds ordered this encounter  Medications  . venlafaxine XR (EFFEXOR XR) 75 MG 24 hr capsule    Sig: Take 3 capsules (225 mg total) by mouth daily with breakfast.    Dispense:  90 capsule    Refill:  3    CMA served as scribe during this visit. History, Physical and Plan performed by medical provider. Documentation and orders reviewed and attested to.  Penni Homans, MD

## 2017-08-17 ENCOUNTER — Other Ambulatory Visit: Payer: Self-pay | Admitting: Family Medicine

## 2017-08-19 NOTE — Assessment & Plan Note (Signed)
Well controlled, no changes to meds. Encouraged heart healthy diet such as the DASH diet and exercise as tolerated.  °

## 2017-08-19 NOTE — Assessment & Plan Note (Signed)
She tearful and stressed regarding the care of her disabled son whom she still cares for herself.  She is exhausted and has limited resources to help with his care.  She agrees to counseling and referral was placed.  She declines medications.  She acknowledges anhedonia and fatigue but does not endorse any suicidal or homicidal ideation.  She is not interested in placement for him at this time but does agree to at least look at facilities in the area to see if she can find when she is comfortable with in the event something happened to her.  Is counseled for 40 minutes of a 45-minute visit.

## 2017-08-21 ENCOUNTER — Ambulatory Visit
Admission: RE | Admit: 2017-08-21 | Discharge: 2017-08-21 | Disposition: A | Discharge status: Home / Self Care | Payer: Medicare Other | Source: Ambulatory Visit | Attending: Adult Health | Admitting: Adult Health

## 2017-08-21 DIAGNOSIS — Z1231 Encounter for screening mammogram for malignant neoplasm of breast: Secondary | ICD-10-CM | POA: Diagnosis not present

## 2017-08-22 ENCOUNTER — Encounter (INDEPENDENT_AMBULATORY_CARE_PROVIDER_SITE_OTHER): Payer: Self-pay

## 2017-09-05 ENCOUNTER — Other Ambulatory Visit: Payer: Self-pay | Admitting: Family Medicine

## 2017-09-06 DIAGNOSIS — N3010 Interstitial cystitis (chronic) without hematuria: Secondary | ICD-10-CM | POA: Diagnosis not present

## 2017-10-04 ENCOUNTER — Ambulatory Visit (INDEPENDENT_AMBULATORY_CARE_PROVIDER_SITE_OTHER): Payer: Medicare Other | Admitting: Psychology

## 2017-10-04 DIAGNOSIS — F4323 Adjustment disorder with mixed anxiety and depressed mood: Secondary | ICD-10-CM

## 2017-10-15 NOTE — Progress Notes (Addendum)
Subjective:   Shannon [name] is a 72 y.o. female who presents for Medicare Annual (Subsequent) preventive examination.  Pt is primary caregiver for son who has down syndrome and stroke.  Review of Systems: No ROS.  Medicare Wellness Visit. Additional risk factors are reflected in the social history. Cardiac Risk Factors include: advanced age (>34mn, >>17women);dyslipidemia;hypertension Sleep patterns: wakes up often to care for her handicap son. Home Safety/Smoke Alarms: Feels safe in home. Smoke alarms in place. Does okay with stairs. Lives in 4 story home.  Female:       Mammo-utd       Dexa scan-active order. Pt states she will schedule.    CCS-last 05/18/09. Pt states recall is 10 yrs.      Objective:     Vitals: BP 124/76 (BP Location: Left Arm, Patient Position: Sitting, Cuff Size: Normal)   Pulse 78   Ht '5\' 1"'  (1.549 m)   Wt 182 lb (82.6 kg)   LMP 01/30/1994   SpO2 97%   BMI 34.39 kg/m   Body mass index is 34.39 kg/m.  Advanced Directives 10/16/2017 03/30/2016 02/27/2016 12/28/2015 05/17/2015 12/21/2014 11/24/2014  Does Patient Have a Medical Advance Directive? Yes Yes Yes Yes Yes Yes Yes  Type of AParamedicof AOld StationLiving will Living will Living will;Healthcare Power of [place]Chula VistaLiving will Healthcare Power of ABrant Lake SouthLiving will -  Does patient want to make changes to medical advance directive? - No - Patient declined - Yes (MAU/Ambulatory/Procedural Areas - Information given) No - Patient declined - -  Copy of [place] Chart? No - copy requested - - No - copy requested No - copy requested No - copy requested -  Pre-existing out of facility DNR order (yellow form or pink MOST form) - - - - - - -    Tobacco Social History   Tobacco Use  Smoking Status Never Smoker  Smokeless Tobacco Never Used     Counseling given: Not Answered   Clinical Intake: Pain :  No/denies pain    Past Medical History:  Diagnosis Date  . 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 Anemia 08/28/2011  . Asthma    as a child  . Brain tumor ([place]   . Breast cancer ([facility]    right breast  . Carpal tunnel syndrome of left wrist   . Cataracts, bilateral 12/28/2015  . Constipation 03/03/2012  . DEPRESSION 08/11/2008  . Dermatitis   . Facial fracture ([place]   . GERD 08/11/2008  . H/O measles   . H/O mumps   . History of blood transfusion   . History of chicken pox   . History of hiatal hernia   . 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 HYPERLIPIDEMIA 08/11/2008  . HYPERTENSION 08/11/2008  . Hyponatremia 08/28/2011  . 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 Interstitial cystitis   . LIPOMA 02/10/2009  . Medicare annual wellness visit, subsequent 12/27/2014   Follows with Dr EAmalia Haileyof urology Follows with Dr [facility]osey [facility]ontoat [facility]th[facility] Follows with Dr JMartiniqueof dermatology Follows with [facility], last scope done in 2011 repeat in [facility]-2 [facility] Last Pap December 2015, repeat in 3 years.   . Melanoma [place]   DR. JRonnald Ramp . Neuropathy   .  NICM (nonischemic cardiomyopathy) ([place]) 03/08/2010   likely 2/2 chemotx - a. Echo 2012: EF 45-50%;  b. Lex MV 2/12:  low risk, apical defect (small area of ischemia vs shifting breast atten);  c.  Echo 7/12: Normal wall thickness, EF 60-65%, normal wall motion, grade 1 diastolic dysfunction, mild LAE, PASP 32;   d. Lex MV 11/13:  EF 76%, no ischemia  . OA (osteoarthritis) 09/07/2011  . Obesity 09/28/2014  . Osteoporosis   . Personal history of chemotherapy 2012  . Personal history of radiation therapy 2012  . PREDIABETES 06/15/2009  . Recurrent falls 12/28/2015  . Shortness of breath   . UTI (lower urinary tract infection) 03/03/2012  . Vertebral fracture, osteoporotic, sequela 04/05/2016   Past Surgical  History:  Procedure Laterality Date  . BREAST LUMPECTOMY  04/2009   RIGHT FOR BREAST CANCER-CHEMO/RADIATION X 1 YEAR  . BREAST REDUCTION SURGERY Bilateral 05/04/2014   Procedure: MAMMARY REDUCTION  (BREAST);  Surgeon: Cristine Polio, MD;  Location: [place];  Service: Plastics;  Laterality: Bilateral;  . CARPAL TUNNEL RELEASE     L wrist, ulnar nerve moved  . CHOLECYSTECTOMY    . CRANIOTOMY FOR TUMOR  2000  . ELBOW SURGERY     left  . KNEE ARTHROSCOPY Left 10/12/2014   Procedure: LEFT KNEE ARTHROSCOPY ;  Surgeon: Gaynelle Arabian, MD;  Location: [facility];  Service: Orthopedics;  Laterality: Left;  . KYPHOPLASTY N/A 03/30/2016   Procedure: THORACIC 12 KYPHOPLASTY;  Surgeon: Phylliss Bob, MD;  Location: [place];  Service: Orthopedics;  Laterality: N/A;  THORACIC 12 KYPHOPLASTY  . LEFT HEART CATHETERIZATION WITH CORONARY ANGIOGRAM N/A 12/16/2013   Procedure: LEFT HEART CATHETERIZATION WITH CORONARY ANGIOGRAM;  Surgeon: Peter M [name], MD;  Location[facility] CATH LAB;  Service: Cardiovascular;  Laterality: N/A;  . LIPOMA EXCISION  03/28/2009   right leg  . MELANOMA EXCISION    . PORT-A-CATH REMOVAL  11/30/2010   Streck  . porta cath    . PORTACATH PLACEMENT  may 2011  . REDUCTION MAMMAPLASTY Bilateral   . SYNOVECTOMY Left 10/12/2014   Procedure: WITH SYNOVECTOMY;  Surgeon: Gaynelle Arabian, MD;  Location[facility];  Service: Orthopedics;  Laterality: Left;  . TONSILLECTOMY  1958  . TOTAL KNEE ARTHROPLASTY  02/05/2012   Procedure: TOTAL KNEE ARTHROPLASTY;  Surgeon: Gearlean Alf, MD;  Location: [facility];  Service: Orthopedics;  Laterality: Right;  . TOTAL KNEE ARTHROPLASTY Left 02/10/2013   Procedure: LEFT TOTAL KNEE ARTHROPLASTY;  Surgeon: Gearlean Alf, MD;  Location: [facility];  Service: Orthopedics;  Laterality: Left;  . total knee raplacement  01-2012   Right Knee  . TUBAL LIGATION  1997   Family History  Problem Relation Age of Onset  . Breast cancer Mother        sarcoma  . Lung  cancer Mother   . Hypertension Mother   . Prostate cancer Father   . Congestive Heart Failure Father   . Heart attack Father   . Prostate cancer Brother   . Down syndrome Son   . Heart disease Maternal Grandfather        MI  . Stomach cancer Maternal Aunt   . Uterine cancer Maternal Aunt   . Colon cancer Neg Hx    Social History   Socioeconomic History  . Marital status: Married    Spouse name: Not on file  . Number of children: 3  . Years of education: Not on file  . Highest education level:  Not on file  Occupational History  . Occupation: Copywriter, advertising: [employer]  Social Needs  . Financial resource strain: Not on file  . Food insecurity:    Worry: Not on file    Inability: Not on file  . Transportation needs:    Medical: Not on file    Non-medical: Not on file  Tobacco Use  . Smoking status: Never Smoker  . Smokeless tobacco: Never Used  Substance and Sexual Activity  . Alcohol use: No  . Drug use: No  . Sexual activity: Never  Lifestyle  . Physical activity:    Days per week: Not on file    Minutes per session: Not on file  . Stress: Not on file  Relationships  . Social connections:    Talks on phone: Not on file    Gets together: Not on file    Attends religious service: Not on file    Active member of club or organization: Not on file    Attends meetings of clubs or organizations: Not on file    Relationship status: Not on file  Other Topics Concern  . Not on file  Social History Narrative  . Not on file    Outpatient Encounter Medications as of 10/16/2017  Medication Sig  . acetaminophen (TYLENOL) 500 MG tablet Take 500-1,000 mg by mouth every 6 (six) hours as needed for moderate pain or headache.   Marland Kitchen aspirin EC 81 MG tablet Take 81 mg by mouth daily.  . B Complex-C (SUPER B COMPLEX PO) Take 1 tablet by mouth daily.  . carvedilol (COREG) 25 MG tablet Take 0.5 tablets (12.5 mg total) by mouth 2 (two) times daily with a meal. Hold  if your blood pressure is <120/80  . clonazePAM (KLONOPIN) 0.5 MG tablet TAKE 1/2 TO 1 TABLET BY MOUTH 3 TIMES A DAY AS NEEDED FOR ANXIETY  . ferrous sulfate 325 (65 FE) MG tablet Take 325 mg by mouth daily with breakfast.  . folic acid (FOLVITE) 1 MG tablet TAKE 1 TABLET BY MOUTH EVERY DAY  . gabapentin (NEURONTIN) 300 MG capsule TAKE 1 CAPSULE BY MOUTH TWICE A DAY (Patient taking differently: daily. )  . hydrOXYzine (ATARAX/VISTARIL) 25 MG tablet Take 25 mg by mouth at bedtime.  . Multiple Vitamin (MULTIVITAMIN WITH MINERALS) TABS tablet Take 1 tablet by mouth daily.  Marland Kitchen nystatin (MYCOSTATIN/NYSTOP) powder Apply topically 4 (four) times daily.  . pentosan polysulfate (ELMIRON) 100 MG capsule Take 100 mg by mouth 2 (two) times daily.   . rosuvastatin (CRESTOR) 20 MG tablet TAKE 1 TABLET (20 MG TOTAL) BY MOUTH AT BEDTIME.  Marland Kitchen telmisartan (MICARDIS) 80 MG tablet TAKE 1 TABLET BY MOUTH EVERY DAY (Patient taking differently: 40 mg. )  . venlafaxine XR (EFFEXOR-XR) 75 MG 24 hr capsule Take 2 capsules (150 mg total) by mouth daily with breakfast.  . [DISCONTINUED] clonazePAM (KLONOPIN) 0.5 MG tablet TAKE 1/2 TO 1 TABLET BY MOUTH 3 TIMES A DAY AS NEEDED FOR ANXIETY  . [DISCONTINUED] venlafaxine XR (EFFEXOR-XR) 75 MG 24 hr capsule TAKE 3 CAPSULES (225 MG TOTAL) BY MOUTH DAILY WITH BREAKFAST.   No facility-administered encounter medications on file as of 10/16/2017.     Activities of Daily Living In your present state of health, do you have any difficulty performing the following activities: 10/16/2017  Hearing? N  Vision? N  Difficulty concentrating or making decisions? N  Walking or climbing stairs? N  Dressing or bathing? N  Doing  errands, shopping? N  Preparing Food and eating ? N  Using the Toilet? N  In the past six months, have you accidently leaked urine? N  Do you have problems with loss of bowel control? N  Managing your Medications? N  Managing your Finances? N  Housekeeping or  managing your Housekeeping? N  Some recent data might be hidden    Patient Care Team: Mosie Lukes, MD as PCP - General (Family Medicine) Domingo Pulse, MD as Consulting Physician (Urology) Calvert Cantor, MD as Consulting Physician (Ophthalmology) Stanford Breed Denice Bors, MD as Consulting Physician (Cardiology)    Assessment:   This is a routine wellness examination for [place]. Physical assessment deferred to PCP.  Exercise Activities and Dietary recommendations Current Exercise Habits: The patient does not participate in regular exercise at present, Exercise limited by: None identified   Diet (meal preparation, eat out, water intake, caffeinated beverages, dairy products, fruits and vegetables): well balanced      Goals    . DIET - EAT MORE FRUITS AND VEGETABLES    . DIET - INCREASE WATER INTAKE       Fall Risk Fall Risk  10/16/2017 10/16/2017 12/28/2015 05/24/2015 12/21/2014  Falls in the past year? No No Yes Yes Yes  Number falls in past yr: - - 2 or more 1 1  Injury with Fall? - - Yes Yes No  Comment - - Cracked bone around her eye, Bruised her side. Rushing out the garage and slipped and fell. - -  Risk Factor Category  - - - High Fall Risk -  Follow up - - - Falls evaluation completed;Education provided;Falls prevention discussed -   Depression Screen PHQ 2/9 Scores 10/16/2017 10/16/2017 12/28/2015  PHQ - 2 Score 0 0 6  PHQ- 9 Score - - 12     Cognitive Function Ad8 score reviewed for issues:  Issues making decisions:no  Less interest in hobbies / activities:no  Repeats questions, stories (family complaining):no  Trouble using ordinary gadgets (microwave, computer, phone):no  Forgets the month or year: no  Mismanaging finances: no  Remembering appts:no  Daily problems with thinking and/or memory:no Ad8 score is=0         Immunization History  Administered Date(s) Administered  . Influenza Split 11/11/2010, 11/14/2011  . Influenza Whole 02/10/2009  .  Influenza, High Dose Seasonal PF 11/04/2015, 10/16/2017  . Influenza,inj,Quad PF,6+ Mos 10/18/2012, 10/10/2013, 12/21/2014  . Influenza-Unspecified 08/30/2016  . Pneumococcal Conjugate-13 12/21/2014  . Pneumococcal Polysaccharide-23 09/04/2011  . Td 01/30/2001, 12/28/2015   Screening Tests Health Maintenance  Topic Date Due  . INFLUENZA VACCINE  08/30/2017  . COLONOSCOPY  05/19/2019  . MAMMOGRAM  08/22/2019  . TETANUS/TDAP  12/27/2025  . DEXA SCAN  Completed  . Hepatitis C Screening  Completed  . PNA vac Low Risk Adult  Completed      Plan:    Please schedule your next medicare wellness visit with me in 1 yr.  Continue to eat heart healthy diet (full of fruits, vegetables, whole grains, lean protein, water--limit salt, fat, and sugar intake) and increase physical activity as tolerated.  Continue doing brain stimulating activities (puzzles, reading, adult coloring books, staying active) to keep memory sharp.   Bring a copy of your living will and/or healthcare power of attorney to your next office visit.    I have personally reviewed and noted the following in the patient's chart:   . Medical and social history . Use of alcohol, tobacco or illicit drugs  .  Current medications and supplements . Functional ability and status . Nutritional status . Physical activity . Advanced directives . List of other physicians . Hospitalizations, surgeries, and ER visits in previous 12 months . Vitals . Screenings to include cognitive, depression, and falls . Referrals and appointments  In addition, I have reviewed and discussed with patient certain preventive protocols, quality metrics, and best practice recommendations. A written personalized care plan for preventive services as well as general preventive health recommendations were provided to patient.     Shela Nevin, [place]  10/16/2017  Medical screening examination/treatment was performed by qualified clinical staff  member and as supervising physician I was immediately available for consultation/collaboration. I have reviewed documentation and agree with assessment and plan.  Penni Homans, MD

## 2017-10-16 ENCOUNTER — Encounter: Payer: Self-pay | Admitting: *Deleted

## 2017-10-16 ENCOUNTER — Ambulatory Visit (INDEPENDENT_AMBULATORY_CARE_PROVIDER_SITE_OTHER): Payer: Medicare Other | Admitting: *Deleted

## 2017-10-16 ENCOUNTER — Encounter: Payer: Self-pay | Admitting: Family Medicine

## 2017-10-16 ENCOUNTER — Ambulatory Visit (INDEPENDENT_AMBULATORY_CARE_PROVIDER_SITE_OTHER): Payer: Medicare Other | Admitting: Family Medicine

## 2017-10-16 VITALS — BP 124/76 | HR 78 | Ht 61.0 in | Wt 182.0 lb

## 2017-10-16 VITALS — BP 124/76 | HR 78 | Temp 97.8°F | Resp 18 | Wt 182.0 lb

## 2017-10-16 DIAGNOSIS — E782 Mixed hyperlipidemia: Secondary | ICD-10-CM | POA: Diagnosis not present

## 2017-10-16 DIAGNOSIS — F418 Other specified anxiety disorders: Secondary | ICD-10-CM | POA: Diagnosis not present

## 2017-10-16 DIAGNOSIS — Z0000 Encounter for general adult medical examination without abnormal findings: Secondary | ICD-10-CM | POA: Diagnosis not present

## 2017-10-16 DIAGNOSIS — M810 Age-related osteoporosis without current pathological fracture: Secondary | ICD-10-CM

## 2017-10-16 DIAGNOSIS — S02401A Maxillary fracture, unspecified, initial encounter for closed fracture: Secondary | ICD-10-CM

## 2017-10-16 DIAGNOSIS — R7303 Prediabetes: Secondary | ICD-10-CM

## 2017-10-16 DIAGNOSIS — Z79899 Other long term (current) drug therapy: Secondary | ICD-10-CM

## 2017-10-16 DIAGNOSIS — I10 Essential (primary) hypertension: Secondary | ICD-10-CM

## 2017-10-16 DIAGNOSIS — Z23 Encounter for immunization: Secondary | ICD-10-CM

## 2017-10-16 DIAGNOSIS — E559 Vitamin D deficiency, unspecified: Secondary | ICD-10-CM | POA: Diagnosis not present

## 2017-10-16 MED ORDER — VENLAFAXINE HCL ER 75 MG PO CP24: 150.0000 mg | ORAL_CAPSULE | Freq: Every day | ORAL | 2 refills | Status: DC

## 2017-10-16 MED ORDER — CLONAZEPAM 0.5 MG PO TABS: ORAL_TABLET | ORAL | 2 refills | Status: DC

## 2017-10-16 NOTE — Patient Instructions (Addendum)
Shingrix is the new the shingles shot, 2 shots over 2 to 6 months, at pharmacy Hypertension Hypertension, commonly called high blood pressure, is when the force of blood pumping through the arteries is too strong. The arteries are the blood vessels that carry blood from the heart throughout the body. Hypertension forces the heart to work harder to pump blood and may cause arteries to become narrow or stiff. Having untreated or uncontrolled hypertension can cause heart attacks, strokes, kidney disease, and other problems. A blood pressure reading consists of a higher number over a lower number. Ideally, your blood pressure should be below 120/80. The first ("top") number is called the systolic pressure. It is a measure of the pressure in your arteries as your heart beats. The second ("bottom") number is called the diastolic pressure. It is a measure of the pressure in your arteries as the heart relaxes. What are the causes? The cause of this condition is not known. What increases the risk? Some risk factors for high blood pressure are under your control. Others are not. Factors you can change  Smoking.  Having type 2 diabetes mellitus, high cholesterol, or both.  Not getting enough exercise or physical activity.  Being overweight.  Having too much fat, sugar, calories, or salt (sodium) in your diet.  Drinking too much alcohol. Factors that are difficult or impossible to change  Having chronic kidney disease.  Having a family history of high blood pressure.  Age. Risk increases with age.  Race. You may be at higher risk if you are African-American.  Gender. Men are at higher risk than women before age 72. After age 39, women are at higher risk than men.  Having obstructive sleep apnea.  Stress. What are the signs or symptoms? Extremely high blood pressure (hypertensive crisis) may cause:  Headache.  Anxiety.  Shortness of breath.  Nosebleed.  Nausea and  vomiting.  Severe chest pain.  Jerky movements you cannot control (seizures).  How is this diagnosed? This condition is diagnosed by measuring your blood pressure while you are seated, with your arm resting on a surface. The cuff of the blood pressure monitor will be placed directly against the skin of your upper arm at the level of your heart. It should be measured at least twice using the same arm. Certain conditions can cause a difference in blood pressure between your right and left arms. Certain factors can cause blood pressure readings to be lower or higher than normal (elevated) for a short period of time:  When your blood pressure is higher when you are in a health care provider's office than when you are at home, this is called white coat hypertension. Most people with this condition do not need medicines.  When your blood pressure is higher at home than when you are in a health care provider's office, this is called masked hypertension. Most people with this condition may need medicines to control blood pressure.  If you have a high blood pressure reading during one visit or you have normal blood pressure with other risk factors:  You may be asked to return on a different day to have your blood pressure checked again.  You may be asked to monitor your blood pressure at home for 1 week or longer.  If you are diagnosed with hypertension, you may have other blood or imaging tests to help your health care provider understand your overall risk for other conditions. How is this treated? This condition is treated by making  healthy lifestyle changes, such as eating healthy foods, exercising more, and reducing your alcohol intake. Your health care provider may prescribe medicine if lifestyle changes are not enough to get your blood pressure under control, and if:  Your systolic blood pressure is above 130.  Your diastolic blood pressure is above 80.  Your personal target blood pressure  may vary depending on your medical conditions, your age, and other factors. Follow these instructions at home: Eating and drinking  Eat a diet that is high in fiber and potassium, and low in sodium, added sugar, and fat. An example eating plan is called the DASH (Dietary Approaches to Stop Hypertension) diet. To eat this way: ? Eat plenty of fresh fruits and vegetables. Try to fill half of your plate at each meal with fruits and vegetables. ? Eat whole grains, such as whole wheat pasta, brown rice, or whole grain bread. Fill about one quarter of your plate with whole grains. ? Eat or drink low-fat dairy products, such as skim milk or low-fat yogurt. ? Avoid fatty cuts of meat, processed or cured meats, and poultry with skin. Fill about one quarter of your plate with lean proteins, such as fish, chicken without skin, beans, eggs, and tofu. ? Avoid premade and processed foods. These tend to be higher in sodium, added sugar, and fat.  Reduce your daily sodium intake. Most people with hypertension should eat less than 1,500 mg of sodium a day.  Limit alcohol intake to no more than 1 drink a day for nonpregnant women and 2 drinks a day for men. One drink equals 12 oz of beer, 5 oz of wine, or 1 oz of hard liquor. Lifestyle  Work with your health care provider to maintain a healthy body weight or to lose weight. Ask what an ideal weight is for you.  Get at least 30 minutes of exercise that causes your heart to beat faster (aerobic exercise) most days of the week. Activities may include walking, swimming, or biking.  Include exercise to strengthen your muscles (resistance exercise), such as pilates or lifting weights, as part of your weekly exercise routine. Try to do these types of exercises for 30 minutes at least 3 days a week.  Do not use any products that contain nicotine or tobacco, such as cigarettes and e-cigarettes. If you need help quitting, ask your health care provider.  Monitor your  blood pressure at home as told by your health care provider.  Keep all follow-up visits as told by your health care provider. This is important. Medicines  Take over-the-counter and prescription medicines only as told by your health care provider. Follow directions carefully. Blood pressure medicines must be taken as prescribed.  Do not skip doses of blood pressure medicine. Doing this puts you at risk for problems and can make the medicine less effective.  Ask your health care provider about side effects or reactions to medicines that you should watch for. Contact a health care provider if:  You think you are having a reaction to a medicine you are taking.  You have headaches that keep coming back (recurring).  You feel dizzy.  You have swelling in your ankles.  You have trouble with your vision. Get help right away if:  You develop a severe headache or confusion.  You have unusual weakness or numbness.  You feel faint.  You have severe pain in your chest or abdomen.  You vomit repeatedly.  You have trouble breathing. Summary  Hypertension is when  the force of blood pumping through your arteries is too strong. If this condition is not controlled, it may put you at risk for serious complications.  Your personal target blood pressure may vary depending on your medical conditions, your age, and other factors. For most people, a normal blood pressure is less than 120/80.  Hypertension is treated with lifestyle changes, medicines, or a combination of both. Lifestyle changes include weight loss, eating a healthy, low-sodium diet, exercising more, and limiting alcohol. This information is not intended to replace advice given to you by your health care provider. Make sure you discuss any questions you have with your health care provider. Document Released: 01/16/2005 Document Revised: 12/15/2015 Document Reviewed: 12/15/2015 Elsevier Interactive Patient Education  2018 [employer]  [employer]  

## 2017-10-16 NOTE — Patient Instructions (Signed)
Please schedule your next medicare wellness visit with me in 1 yr.  Continue to eat heart healthy diet (full of fruits, vegetables, whole grains, lean protein, water--limit salt, fat, and sugar intake) and increase physical activity as tolerated.  Continue doing brain stimulating activities (puzzles, reading, adult coloring books, staying active) to keep memory sharp.   Bring a copy of your living will and/or healthcare power of attorney to your next office visit.   Shannon [name] , Thank you for taking time to come for your Medicare Wellness Visit. I appreciate your ongoing commitment to your health goals. Please review the following plan we discussed and let me know if I can assist you in the future.   These are the goals we discussed: Goals    . DIET - EAT MORE FRUITS AND VEGETABLES    . DIET - INCREASE WATER INTAKE       This is a list of the screening recommended for you and due dates:  Health Maintenance  Topic Date Due  . Flu Shot  08/30/2017  . Colon Cancer Screening  05/19/2019  . Mammogram  08/22/2019  . Tetanus Vaccine  12/27/2025  . DEXA scan (bone density measurement)  Completed  .  Hepatitis C: One time screening is recommended by Center for Disease Control  (CDC) for  adults born from [birth year] through 1966.   Completed  . Pneumonia vaccines  Completed    Preventive Care 69 Years and Older, Female Preventive care refers to lifestyle choices and visits with your health care provider that can promote health and wellness. What does preventive care include?  A yearly physical exam. This is also called an annual well check.  Dental exams once or twice a year.  Routine eye exams. Ask your health care provider how often you should have your eyes checked.  Personal lifestyle choices, including: ? Daily care of your teeth and gums. ? Regular physical activity. ? Eating a healthy diet. ? Avoiding tobacco and drug use. ? Limiting alcohol use. ? Practicing safe sex. ? Taking  low-dose aspirin every day. ? Taking vitamin and mineral supplements as recommended by your health care provider. What happens during an annual well check? The services and screenings done by your health care provider during your annual well check will depend on your age, overall health, lifestyle risk factors, and family history of disease. Counseling Your health care provider may ask you questions about your:  Alcohol use.  Tobacco use.  Drug use.  Emotional well-being.  Home and relationship well-being.  Sexual activity.  Eating habits.  History of falls.  Memory and ability to understand (cognition).  Work and work Statistician.  Reproductive health.  Screening You may have the following tests or measurements:  Height, weight, and BMI.  Blood pressure.  Lipid and cholesterol levels. These may be checked every 5 years, or more frequently if you are over 92 years old.  Skin check.  Lung cancer screening. You may have this screening every year starting at age 52 if you have a 30-pack-year history of smoking and currently smoke or have quit within the past 15 years.  Fecal occult blood test (FOBT) of the stool. You may have this test every year starting at age 83.  Flexible sigmoidoscopy or colonoscopy. You may have a sigmoidoscopy every 5 years or a colonoscopy every 10 years starting at age 65.  Hepatitis C blood test.  Hepatitis B blood test.  Sexually transmitted disease (STD) testing.  Diabetes screening. This  is done by checking your blood sugar (glucose) after you have not eaten for a while (fasting). You may have this done every 1-3 years.  Bone density scan. This is done to screen for osteoporosis. You may have this done starting at age 50.  Mammogram. This may be done every 1-2 years. Talk to your health care provider about how often you should have regular mammograms.  Talk with your health care provider about your test results, treatment options,  and if necessary, the need for more tests. Vaccines Your health care provider may recommend certain vaccines, such as:  Influenza vaccine. This is recommended every year.  Tetanus, diphtheria, and acellular pertussis (Tdap, Td) vaccine. You may need a Td booster every 10 years.  Varicella vaccine. You may need this if you have not been vaccinated.  Zoster vaccine. You may need this after age 48.  Measles, mumps, and rubella (MMR) vaccine. You may need at least one dose of MMR if you were born in 1957 or later. You may also need a second dose.  Pneumococcal 13-valent conjugate (PCV13) vaccine. One dose is recommended after age 54.  Pneumococcal polysaccharide (PPSV23) vaccine. One dose is recommended after age 16.  Meningococcal vaccine. You may need this if you have certain conditions.  Hepatitis A vaccine. You may need this if you have certain conditions or if you travel or work in places where you may be exposed to hepatitis A.  Hepatitis B vaccine. You may need this if you have certain conditions or if you travel or work in places where you may be exposed to hepatitis B.  Haemophilus influenzae type b (Hib) vaccine. You may need this if you have certain conditions.  Talk to your health care provider about which screenings and vaccines you need and how often you need them. This information is not intended to replace advice given to you by your health care provider. Make sure you discuss any questions you have with your health care provider. Document Released: 02/12/2015 Document Revised: 10/06/2015 Document Reviewed: 11/17/2014 Elsevier Interactive Patient Education  [employer].

## 2017-10-17 DIAGNOSIS — S02401A Maxillary fracture, unspecified, initial encounter for closed fracture: Secondary | ICD-10-CM

## 2017-10-17 HISTORY — DX: Maxillary fracture, unspecified side, initial encounter for closed fracture: S02.401A

## 2017-10-17 LAB — PAIN MGMT, PROFILE 8 W/CONF, U
6 ACETYLMORPHINE: NEGATIVE ng/mL (ref <10)
AMPHETAMINES: NEGATIVE ng/mL (ref <500)
Alcohol Metabolites: NEGATIVE ng/mL (ref <500)
BUPRENORPHINE, URINE: NEGATIVE ng/mL (ref <5)
Benzodiazepines: NEGATIVE ng/mL (ref <100)
Cocaine Metabolite: NEGATIVE ng/mL (ref <150)
Creatinine: 32.3 mg/dL
MDMA: NEGATIVE ng/mL (ref <500)
Marijuana Metabolite: NEGATIVE ng/mL (ref <20)
OXIDANT: NEGATIVE ug/mL (ref <200)
OXYCODONE: NEGATIVE ng/mL (ref <100)
Opiates: NEGATIVE ng/mL (ref <100)
pH: 6.65 (ref 4.5–9.0)

## 2017-10-17 NOTE — Assessment & Plan Note (Signed)
Encouraged to get adequate exercise, calcium and vitamin d intake 

## 2017-10-17 NOTE — Progress Notes (Signed)
Subjective:    Patient ID: Shannon [name], female    DOB: 1945/11/04, 72 y.o.   MRN: [id number]  No chief complaint on file.   HPI Patient is in today for follow up. 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 He is now total care and up at night. She is exhausted but still hopes to keep him home she is trying to obtain more services with the cap pogram and cuently has 3 hours of help a day. Denies CP/palp/SOB/HA/congestion/fevers/GI or GU c/o. Taking meds as prescribed  Past Medical History:  Diagnosis Date  . 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 Anemia 08/28/2011  . Asthma    as a child  . Brain tumor ([place])   . Breast cancer ([place])    right breast  . Carpal tunnel syndrome of left wrist   . Cataracts, bilateral 12/28/2015  . Constipation 03/03/2012  . DEPRESSION 08/11/2008  . Dermatitis   . Facial fracture ([place])   . GERD 08/11/2008  . H/O measles   . H/O mumps   . History of blood transfusion   . History of chicken pox   . History of hiatal hernia   . 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 HYPERLIPIDEMIA 08/11/2008  . HYPERTENSION 08/11/2008  . Hyponatremia 08/28/2011  . 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 Interstitial cystitis   . LIPOMA 02/10/2009  . Medicare annual wellness visit, subsequent 12/27/2014   Follows with Dr Amalia Hailey of urology Follows with Dr Posey Pronto at [facility] for opthamology Follows with Dr [name] of dermatology Follows with [facility] gastroenterology, last scope done in 2011 repeat in 10 years Last [facility] July 2015 should repeat every 1-2 years Last Pap December 2015, repeat in 3 years.   . Melanoma ([place]) 2008   DR. Ronnald Ramp  . Neuropathy   . NICM (nonischemic cardiomyopathy) ([place]) 03/08/2010   likely 2/2 chemotx - a. Echo 2012: EF 45-50%;  b. Lex MV 2/12:  low risk, apical defect (small area of ischemia vs shifting breast atten);  c.  Echo 7/12:  Normal wall thickness, EF 60-65%, normal wall motion, grade 1 diastolic dysfunction, mild LAE, PASP 32;   d. Lex MV 11/13:  EF 76%, no ischemia  . OA (osteoarthritis) 09/07/2011  . Obesity 09/28/2014  . Osteoporosis   . Personal history of chemotherapy 2012  . Personal history of radiation therapy 2012  . PREDIABETES 06/15/2009  . Recurrent falls 12/28/2015  . Shortness of breath   . UTI (lower urinary tract infection) 03/03/2012  . Vertebral fracture, osteoporotic, sequela 04/05/2016    Past Surgical History:  Procedure Laterality Date  . BREAST LUMPECTOMY  04/2009   RIGHT FOR BREAST CANCER-CHEMO/RADIATION X 1 YEAR  . BREAST REDUCTION SURGERY Bilateral 05/04/2014   Procedure: MAMMARY REDUCTION  (BREAST);  Surgeon: Cristine Polio, MD;  Location: [facility];  Service: Plastics;  Laterality: Bilateral;  . CARPAL TUNNEL RELEASE     L wrist, ulnar nerve moved  . CHOLECYSTECTOMY    . CRANIOTOMY FOR TUMOR  2000  . ELBOW SURGERY     left  . KNEE ARTHROSCOPY Left 10/12/2014   Procedure: LEFT KNEE ARTHROSCOPY ;  Surgeon: Gaynelle Arabian, MD;  Location: [facility];  Service: Orthopedics;  Laterality: Left;  . KYPHOPLASTY N/A 03/30/2016   Procedure: THORACIC 12 KYPHOPLASTY;  Surgeon: Phylliss Bob, MD;  Location: [facility];  Service: Orthopedics;  Laterality: N/A;  THORACIC 12 KYPHOPLASTY  . LEFT HEART CATHETERIZATION WITH CORONARY ANGIOGRAM N/A 12/16/2013   Procedure: LEFT HEART CATHETERIZATION WITH CORONARY ANGIOGRAM;  Surgeon: Peter M [name], MD;  Location[facility];  Service: Cardiovascular;  Laterality: N/A;  . LIPOMA EXCISION  03/28/2009   right leg  . MELANOMA EXCISION    . PORT-A-CATH REMOVAL  11/30/2010   Streck  . porta cath    . PORTACATH PLACEMENT  may 2011  . REDUCTION MAMMAPLASTY Bilateral   . SYNOVECTOMY Left 10/12/2014   Procedure: WITH SYNOVECTOMY;  Surgeon: Gaynelle Arabian, MD;  Location: [facility];  Service: Orthopedics;  Laterality: Left;  . TONSILLECTOMY  1958  . TOTAL KNEE  ARTHROPLASTY  02/05/2012   Procedure: TOTAL KNEE ARTHROPLASTY;  Surgeon: Gearlean Alf, MD;  Location: [facility];  Service: Orthopedics;  Laterality: Right;  . TOTAL KNEE ARTHROPLASTY Left 02/10/2013   Procedure: LEFT TOTAL KNEE ARTHROPLASTY;  Surgeon: Gearlean Alf, MD;  Location: [facility];  Service: Orthopedics;  Laterality: Left;  . total knee raplacement  01-2012   Right Knee  . TUBAL LIGATION  1997    Family History  Problem Relation Age of Onset  . Breast cancer Mother        sarcoma  . Lung cancer Mother   . Hypertension Mother   . Prostate cancer Father   . Congestive Heart Failure Father   . Heart attack Father   . Prostate cancer Brother   . Down syndrome Son   . Heart disease Maternal Grandfather        MI  . Stomach cancer Maternal Aunt   . Uterine cancer Maternal Aunt   . Colon cancer Neg Hx     Social History   Socioeconomic History  . Marital status: Married    Spouse name: Not on file  . Number of children: 3  . Years of education: Not on file  . Highest education level: Not on file  Occupational History  . Occupation: Copywriter, advertising: [employer]  Social Needs  . Financial resource strain: Not on file  . Food insecurity:    Worry: Not on file    Inability: Not on file  . Transportation needs:    Medical: Not on file    Non-medical: Not on file  Tobacco Use  . Smoking status: Never Smoker  . Smokeless tobacco: Never Used  Substance and Sexual Activity  . Alcohol use: No  . Drug use: No  . Sexual activity: Never  Lifestyle  . Physical activity:    Days per week: Not on file    Minutes per session: Not on file  . Stress: Not on file  Relationships  . Social connections:    Talks on phone: Not on file    Gets together: Not on file    Attends religious service: Not on file    Active member of club or organization: Not on file    Attends meetings of clubs or organizations: Not on file    Relationship status: Not on file  .  Intimate partner violence:    Fear of current or ex partner: Not on file    Emotionally abused: Not on file    Physically abused: Not on file    Forced sexual activity: Not on file  Other Topics Concern  . Not on file  Social History Narrative  . Not on  file    Outpatient Medications Prior to Visit  Medication Sig Dispense Refill  . acetaminophen (TYLENOL) 500 MG tablet Take 500-1,000 mg by mouth every 6 (six) hours as needed for moderate pain or headache.     Marland Kitchen aspirin EC 81 MG tablet Take 81 mg by mouth daily.    . B Complex-C (SUPER B COMPLEX PO) Take 1 tablet by mouth daily.    . carvedilol (COREG) 25 MG tablet Take 0.5 tablets (12.5 mg total) by mouth 2 (two) times daily with a meal. Hold if your blood pressure is <120/80 30 tablet 0  . ferrous sulfate 325 (65 FE) MG tablet Take 325 mg by mouth daily with breakfast.    . folic acid (FOLVITE) 1 MG tablet TAKE 1 TABLET BY MOUTH EVERY DAY 90 tablet 3  . gabapentin (NEURONTIN) 300 MG capsule TAKE 1 CAPSULE BY MOUTH TWICE A DAY (Patient taking differently: daily. ) 180 capsule 1  . hydrOXYzine (ATARAX/VISTARIL) 25 MG tablet Take 25 mg by mouth at bedtime.    . Multiple Vitamin (MULTIVITAMIN WITH MINERALS) TABS tablet Take 1 tablet by mouth daily.    Marland Kitchen nystatin (MYCOSTATIN/NYSTOP) powder Apply topically 4 (four) times daily. 15 g 0  . pentosan polysulfate (ELMIRON) 100 MG capsule Take 100 mg by mouth 2 (two) times daily.     . rosuvastatin (CRESTOR) 20 MG tablet TAKE 1 TABLET (20 MG TOTAL) BY MOUTH AT BEDTIME. 90 tablet 3  . telmisartan (MICARDIS) 80 MG tablet TAKE 1 TABLET BY MOUTH EVERY DAY (Patient taking differently: 40 mg. ) 90 tablet 1  . clonazePAM (KLONOPIN) 0.5 MG tablet TAKE 1/2 TO 1 TABLET BY MOUTH 3 TIMES A DAY AS NEEDED FOR ANXIETY 60 tablet 2  . venlafaxine XR (EFFEXOR-XR) 75 MG 24 hr capsule TAKE 3 CAPSULES (225 MG TOTAL) BY MOUTH DAILY WITH BREAKFAST. 270 capsule 2   No facility-administered medications prior to visit.      Allergies  Allergen Reactions  . Dilaudid [Hydromorphone Hcl] Itching    Review of Systems  Constitutional: Positive for malaise/fatigue. Negative for fever.  HENT: Negative for congestion.   Eyes: Negative for blurred vision.  Respiratory: Negative for shortness of breath.   Cardiovascular: Negative for chest pain, palpitations and leg swelling.  Gastrointestinal: Negative for abdominal pain, blood in stool and nausea.  Genitourinary: Negative for dysuria and frequency.  Musculoskeletal: Negative for falls.  Skin: Negative for rash.  Neurological: Negative for dizziness, loss of consciousness and headaches.  Endo/Heme/Allergies: Negative for environmental allergies.  Psychiatric/Behavioral: Positive for depression. The patient is nervous/anxious.        Objective:    Physical Exam  Constitutional: She is oriented to person, place, and time. She appears well-developed and well-nourished. No distress.  HENT:  Head: Normocephalic and atraumatic.  Nose: Nose normal.  Eyes: Right eye exhibits no discharge. Left eye exhibits no discharge.  Neck: Normal range of motion. Neck supple.  Cardiovascular: Normal rate and regular rhythm.  No murmur heard. Pulmonary/Chest: Effort normal and breath sounds normal.  Abdominal: Soft. Bowel sounds are normal. There is no tenderness.  Musculoskeletal: She exhibits no edema.  Neurological: She is alert and oriented to person, place, and time.  Skin: Skin is warm and dry.  Psychiatric: She has a normal mood and affect.  Nursing note and vitals reviewed.   BP 124/76 (BP Location: Left Arm, Patient Position: Sitting, Cuff Size: Normal)   Pulse 78   Temp 97.8 F (36.6 C) (Oral)  Resp 18   Wt 182 lb (82.6 kg)   LMP 01/30/1994   SpO2 97%   BMI 34.39 kg/m  Wt Readings from Last 3 Encounters:  10/16/17 182 lb (82.6 kg)  10/16/17 182 lb (82.6 kg)  08/14/17 189 lb 6.4 oz (85.9 kg)     Lab Results  Component Value Date   WBC 6.3  06/14/2017   HGB 12.0 06/14/2017   HCT 36.9 06/14/2017   PLT 241.0 06/14/2017   GLUCOSE 99 06/29/2017   CHOL 161 06/14/2017   TRIG 168.0 (H) 06/14/2017   HDL 52.40 06/14/2017   LDLDIRECT 98.0 11/04/2015   LDLCALC 75 06/14/2017   ALT 20 06/29/2017   AST 20 06/29/2017   NA 142 06/29/2017   K 4.1 06/29/2017   CL 105 06/29/2017   CREATININE 0.79 06/29/2017   BUN 15 06/29/2017   CO2 30 06/29/2017   TSH 2.54 06/14/2017   INR 1.06 03/30/2016   HGBA1C 5.7 06/14/2017    Lab Results  Component Value Date   TSH 2.54 06/14/2017   Lab Results  Component Value Date   WBC 6.3 06/14/2017   HGB 12.0 06/14/2017   HCT 36.9 06/14/2017   MCV 90.6 06/14/2017   PLT 241.0 06/14/2017   Lab Results  Component Value Date   NA 142 06/29/2017   K 4.1 06/29/2017   CHLORIDE 107 08/07/2016   CO2 30 06/29/2017   GLUCOSE 99 06/29/2017   BUN 15 06/29/2017   CREATININE 0.79 06/29/2017   BILITOT 0.3 06/29/2017   ALKPHOS 93 06/29/2017   AST 20 06/29/2017   ALT 20 06/29/2017   PROT 6.4 06/29/2017   ALBUMIN 4.1 06/29/2017   CALCIUM 9.3 06/29/2017   ANIONGAP 7 06/07/2017   EGFR 72 (L) 08/07/2016   GFR 75.81 06/29/2017   Lab Results  Component Value Date   CHOL 161 06/14/2017   Lab Results  Component Value Date   HDL 52.40 06/14/2017   Lab Results  Component Value Date   LDLCALC 75 06/14/2017   Lab Results  Component Value Date   TRIG 168.0 (H) 06/14/2017   Lab Results  Component Value Date   CHOLHDL 3 06/14/2017   Lab Results  Component Value Date   HGBA1C 5.7 06/14/2017       Assessment & Plan:   Problem List Items Addressed This Visit    Hyperlipidemia, mixed   Relevant Orders   Lipid panel   Depression with anxiety    Still struggling with the stress of caring for her son with special needs who si now essentially total care. She does not feel she needs a medication adjustment but we discuss plan of care and the idea of them investigating faciliities for their son due  to his ongoing care needs and her declining health. Counseled and care plan discussed for 20 minutes      Relevant Medications   venlafaxine XR (EFFEXOR-XR) 75 MG 24 hr capsule   Essential hypertension   Relevant Orders   CBC   Comprehensive metabolic panel   TSH   Osteoporosis    Encouraged to get adequate exercise, calcium and vitamin d intake      Prediabetes   Relevant Orders   Hemoglobin A1c   Vitamin D deficiency   Relevant Orders   VITAMIN D 25 Hydroxy (Vit-D Deficiency, Fractures)   Closed fracture of maxilla, initial encounter [facility])    Other Visit Diagnoses    Needs flu shot    -  Primary  Relevant Orders   Flu vaccine HIGH DOSE PF (Fluzone High dose) (Completed)   High risk medication use       Relevant Orders   Pain Mgmt, Profile 8 w/Conf, U (Completed)      I have changed Alycia Patten. Kustra's venlafaxine XR. I am also having her maintain her hydrOXYzine, pentosan polysulfate, B Complex-C (SUPER B COMPLEX PO), acetaminophen, ferrous sulfate, multivitamin with minerals, carvedilol, nystatin, rosuvastatin, folic acid, aspirin EC, telmisartan, gabapentin, and clonazePAM.  Meds ordered this encounter  Medications  . clonazePAM (KLONOPIN) 0.5 MG tablet    Sig: TAKE 1/2 TO 1 TABLET BY MOUTH 3 TIMES A DAY AS NEEDED FOR ANXIETY    Dispense:  60 tablet    Refill:  2    This request is for a new prescription for a controlled substance as required by Federal/State law.  . venlafaxine XR (EFFEXOR-XR) 75 MG 24 hr capsule    Sig: Take 2 capsules (150 mg total) by mouth daily with breakfast.    Dispense:  180 capsule    Refill:  2     Penni Homans, MD

## 2017-10-17 NOTE — Assessment & Plan Note (Signed)
Still struggling with the stress of caring for her son with special needs who si now essentially total care. She does not feel she needs a medication adjustment but we discuss plan of care and the idea of them investigating faciliities for their son due to his ongoing care needs and her declining health. Counseled and care plan discussed for 20 minutes

## 2017-10-31 ENCOUNTER — Ambulatory Visit (INDEPENDENT_AMBULATORY_CARE_PROVIDER_SITE_OTHER): Payer: Medicare Other | Admitting: Psychology

## 2017-10-31 DIAGNOSIS — F4323 Adjustment disorder with mixed anxiety and depressed mood: Secondary | ICD-10-CM | POA: Diagnosis not present

## 2017-11-25 ENCOUNTER — Other Ambulatory Visit: Payer: Self-pay | Admitting: Family Medicine

## 2017-11-29 ENCOUNTER — Ambulatory Visit (INDEPENDENT_AMBULATORY_CARE_PROVIDER_SITE_OTHER): Payer: Medicare Other | Admitting: Psychology

## 2017-11-29 DIAGNOSIS — F4323 Adjustment disorder with mixed anxiety and depressed mood: Secondary | ICD-10-CM

## 2017-12-12 ENCOUNTER — Ambulatory Visit: Payer: Medicare Other | Admitting: Psychology

## 2017-12-13 ENCOUNTER — Telehealth: Payer: Self-pay | Admitting: Medical

## 2017-12-13 ENCOUNTER — Ambulatory Visit: Payer: Self-pay

## 2017-12-13 ENCOUNTER — Ambulatory Visit (INDEPENDENT_AMBULATORY_CARE_PROVIDER_SITE_OTHER): Payer: Medicare Other | Admitting: Medical

## 2017-12-13 ENCOUNTER — Encounter: Payer: Self-pay | Admitting: Medical

## 2017-12-13 VITALS — BP 119/65 | HR 75 | Temp 97.5°F | Resp 16 | Ht 61.0 in | Wt 178.0 lb

## 2017-12-13 DIAGNOSIS — R300 Dysuria: Secondary | ICD-10-CM | POA: Diagnosis not present

## 2017-12-13 DIAGNOSIS — R5383 Other fatigue: Secondary | ICD-10-CM

## 2017-12-13 DIAGNOSIS — F32A Depression, unspecified: Secondary | ICD-10-CM

## 2017-12-13 DIAGNOSIS — R42 Dizziness and giddiness: Secondary | ICD-10-CM

## 2017-12-13 DIAGNOSIS — F329 Major depressive disorder, single episode, unspecified: Secondary | ICD-10-CM

## 2017-12-13 DIAGNOSIS — R7989 Other specified abnormal findings of blood chemistry: Secondary | ICD-10-CM | POA: Diagnosis not present

## 2017-12-13 LAB — CBC WITH DIFFERENTIAL/PLATELET
Basophils Absolute: 0 10*3/uL (ref 0.0–0.1)
Basophils Relative: 0.7 % (ref 0.0–3.0)
Eosinophils Absolute: 0.3 10*3/uL (ref 0.0–0.7)
Eosinophils Relative: 3.8 % (ref 0.0–5.0)
HCT: 38.6 % (ref 36.0–46.0)
HEMOGLOBIN: 12.7 g/dL (ref 12.0–15.0)
LYMPHS ABS: 1.8 10*3/uL (ref 0.7–4.0)
Lymphocytes Relative: 26.2 % (ref 12.0–46.0)
MCHC: 32.8 g/dL (ref 30.0–36.0)
MCV: 89 fl (ref 78.0–100.0)
MONO ABS: 0.5 10*3/uL (ref 0.1–1.0)
Monocytes Relative: 6.7 % (ref 3.0–12.0)
NEUTROS PCT: 62.6 % (ref 43.0–77.0)
Neutro Abs: 4.3 10*3/uL (ref 1.4–7.7)
Platelets: 239 10*3/uL (ref 150.0–400.0)
RBC: 4.34 Mil/uL (ref 3.87–5.11)
RDW: 13.6 % (ref 11.5–15.5)
WBC: 6.8 10*3/uL (ref 4.0–10.5)

## 2017-12-13 LAB — COMPREHENSIVE METABOLIC PANEL
ALT: 23 U/L (ref 0–35)
AST: 27 U/L (ref 0–37)
Albumin: 4.4 g/dL (ref 3.5–5.2)
Alkaline Phosphatase: 69 U/L (ref 39–117)
BILIRUBIN TOTAL: 0.5 mg/dL (ref 0.2–1.2)
BUN: 14 mg/dL (ref 6–23)
CALCIUM: 9.5 mg/dL (ref 8.4–10.5)
CHLORIDE: 103 meq/L (ref 96–112)
CO2: 29 meq/L (ref 19–32)
Creatinine, Ser: 0.89 mg/dL (ref 0.40–1.20)
GFR: 65.98 mL/min (ref >60.00)
GLUCOSE: 123 mg/dL — AB (ref 70–99)
Potassium: 3.5 mEq/L (ref 3.5–5.1)
SODIUM: 138 meq/L (ref 135–145)
TOTAL PROTEIN: 6.7 g/dL (ref 6.0–8.3)

## 2017-12-13 LAB — VITAMIN D 25 HYDROXY (VIT D DEFICIENCY, FRACTURES): VITD: 35.82 ng/mL (ref 30.00–100.00)

## 2017-12-13 LAB — TSH: TSH: 1.57 u[IU]/mL (ref 0.35–4.50)

## 2017-12-13 LAB — VITAMIN B12

## 2017-12-13 LAB — IRON: Iron: 43 ug/dL (ref 42–145)

## 2017-12-13 NOTE — Telephone Encounter (Signed)
Pt.'s daughter reports pt. Has had body aches, weakness, dizziness when changing positions x 1 week. Unsure of any fever. Has a dry cough. No other symptoms. "Just not getting any better." Appointment made for today. Reason for Disposition . [1] MODERATE weakness (i.e., interferes with work, school, normal activities) AND [2] persists > 3 days  Answer Assessment - Initial Assessment Questions 1. DESCRIPTION: "Describe how you are feeling."      Weak, dizzy, body aches 2. SEVERITY: "How bad is it?"  "Can you stand and walk?"   - MILD - Feels weak or tired, but does not interfere with work, school or normal activities   - [place] to stand and walk; weakness interferes with work, school, or normal activities   - SEVERE - Unable to stand or walk     Mild 3. ONSET:  "When did the weakness begin?"     1 WEEK AGO 4. CAUSE: "What do you think is causing the weakness?"     uNSURE 5. MEDICINES: "Have you recently started a new medicine or had a change in the amount of a medicine?"     No 6. OTHER SYMPTOMS: "Do you have any other symptoms?" (e.g., chest pain, fever, cough, SOB, vomiting, diarrhea, bleeding, other areas of pain)     Dry cough 7. PREGNANCY: "Is there any chance you are pregnant?" "When was your last menstrual period?"     No  Protocols used: WEAKNESS (GENERALIZED) AND FATIGUE-A-AH

## 2017-12-13 NOTE — Progress Notes (Signed)
Subjective:    Patient ID: Shannon [name], female    DOB: 08-23-1945, 72 y.o.   MRN: [id number]  HPI  Pt is in feeling fatigued for years. But worse more than usual.  Pt states recent memory issues. She state having some concentration issues. Examples sometimes forgets whether she took her meds. Describes overall good facial recognize and remembers peoples name. Pt not getting lost driving. Sometimes will forget to get things at grocery store.  Pt has to take care of her son. So is very busy all the time. High level of care for her son. He is in wheel chair She get depressed about her son condition.(Talks a lot of  impact of his health conditions on her mental health/level of stress)  Hx of brain tumor. She had surgery for that. CT of head ct in May 2019 was negative. Surgery hx was 10-12 years ago.   Review of Systems  Constitutional: Positive for fatigue. Negative for chills and fever.  HENT: Negative for congestion and drooling.   Respiratory: Negative for cough, choking and shortness of breath.   Cardiovascular: Negative for chest pain and palpitations.  Musculoskeletal: Negative for myalgias.  Skin: Negative for rash.  Neurological: Positive for dizziness. Negative for seizures, syncope, speech difficulty, weakness and headaches.       See hpi.  Pt has intermittent dizziness on and off.   Hematological: Negative for adenopathy. Does not bruise/bleed easily.  Psychiatric/Behavioral: Positive for decreased concentration and dysphoric mood. Negative for confusion, sleep disturbance and suicidal ideas. The patient is not nervous/anxious.        Recent memory issues.      Past Medical History:  Diagnosis Date  . 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 Anemia 08/28/2011  . Asthma    as a child  . Brain tumor ([place])   . Breast cancer ([place])    right breast  . Carpal tunnel syndrome of left wrist   . Cataracts, bilateral 12/28/2015  . Constipation 03/03/2012  .  DEPRESSION 08/11/2008  . Dermatitis   . Facial fracture ([place])   . GERD 08/11/2008  . H/O measles   . H/O mumps   . History of blood transfusion   . History of chicken pox   . History of hiatal hernia   . 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 HYPERLIPIDEMIA 08/11/2008  . HYPERTENSION 08/11/2008  . Hyponatremia 08/28/2011  . 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 Interstitial cystitis   . LIPOMA 02/10/2009  . Medicare annual wellness visit, subsequent 12/27/2014   Follows with Dr Amalia Hailey of urology Follows with Dr Posey Pronto at [facility] for opthamology Follows with Dr [name] of dermatology Follows with [facility] gastroenterology, last scope done in 2011 repeat in 10 years Last [facility] July 2015 should repeat every 1-2 years Last Pap December 2015, repeat in 3 years.   . Melanoma ([place]) 2008   DR. Ronnald Ramp  . Neuropathy   . NICM (nonischemic cardiomyopathy) ([place]) 03/08/2010   likely 2/2 chemotx - a. Echo 2012: EF 45-50%;  b. Lex MV 2/12:  low risk, apical defect (small area of ischemia vs shifting breast atten);  c.  Echo 7/12: Normal wall thickness, EF 60-65%, normal wall motion, grade 1 diastolic dysfunction, mild LAE, PASP 32;   d. Lex MV 11/13:  EF 76%, no ischemia  . OA (osteoarthritis) 09/07/2011  . Obesity 09/28/2014  .  Osteoporosis   . Personal history of chemotherapy 2012  . Personal history of radiation therapy 2012  . PREDIABETES 06/15/2009  . Recurrent falls 12/28/2015  . Shortness of breath   . UTI (lower urinary tract infection) 03/03/2012  . Vertebral fracture, osteoporotic, sequela 04/05/2016     Social History   Socioeconomic History  . Marital status: Married    Spouse name: Not on file  . Number of children: 3  . Years of education: Not on file  . Highest education level: Not on file  Occupational History  . Occupation: Copywriter, advertising: [employer]  Social Needs  . Financial resource strain: Not on file    . Food insecurity:    Worry: Not on file    Inability: Not on file  . Transportation needs:    Medical: Not on file    Non-medical: Not on file  Tobacco Use  . Smoking status: Never Smoker  . Smokeless tobacco: Never Used  Substance and Sexual Activity  . Alcohol use: No  . Drug use: No  . Sexual activity: Never  Lifestyle  . Physical activity:    Days per week: Not on file    Minutes per session: Not on file  . Stress: Not on file  Relationships  . Social connections:    Talks on phone: Not on file    Gets together: Not on file    Attends religious service: Not on file    Active member of club or organization: Not on file    Attends meetings of clubs or organizations: Not on file    Relationship status: Not on file  . Intimate partner violence:    Fear of current or ex partner: Not on file    Emotionally abused: Not on file    Physically abused: Not on file    Forced sexual activity: Not on file  Other Topics Concern  . Not on file  Social History Narrative  . Not on file    Past Surgical History:  Procedure Laterality Date  . BREAST LUMPECTOMY  04/2009   RIGHT FOR BREAST CANCER-CHEMO/RADIATION X 1 YEAR  . BREAST REDUCTION SURGERY Bilateral 05/04/2014   Procedure: MAMMARY REDUCTION  (BREAST);  Surgeon: Cristine Polio, MD;  Location: [facility];  Service: Plastics;  Laterality: Bilateral;  . CARPAL TUNNEL RELEASE     L wrist, ulnar nerve moved  . CHOLECYSTECTOMY    . CRANIOTOMY FOR TUMOR  2000  . ELBOW SURGERY     left  . KNEE ARTHROSCOPY Left 10/12/2014   Procedure: LEFT KNEE ARTHROSCOPY ;  Surgeon: Gaynelle Arabian, MD;  Location: [facility];  Service: Orthopedics;  Laterality: Left;  . KYPHOPLASTY N/A 03/30/2016   Procedure: THORACIC 12 KYPHOPLASTY;  Surgeon: Phylliss Bob, MD;  Location: [facility];  Service: Orthopedics;  Laterality: N/A;  THORACIC 12 KYPHOPLASTY  . LEFT HEART CATHETERIZATION WITH CORONARY ANGIOGRAM N/A 12/16/2013   Procedure: LEFT HEART  CATHETERIZATION WITH CORONARY ANGIOGRAM;  Surgeon: Peter M [name], MD;  Location: [facility] CATH [facility];  Service: Cardiovascular;  Laterality: N/A;  . LIPOMA EXCISION  03/28/2009   right leg  . MELANOMA EXCISION    . PORT-A-CATH REMOVAL  11/30/2010   [place]  . porta cath    . PORTACATH PLACEMENT  may 2011  . REDUCTION MAMMAPLASTY Bilateral   . SYNOVECTOMY Left 10/12/2014   Procedure: WITH SYNOVECTOMY;  Surgeon: Gaynelle Arabian, MD;  Location: [facility];  Service: Orthopedics;  Laterality: Left;  .  TONSILLECTOMY  1958  . TOTAL KNEE ARTHROPLASTY  02/05/2012   Procedure: TOTAL KNEE ARTHROPLASTY;  Surgeon: Gearlean Alf, MD;  Location: [facility];  Service: Orthopedics;  Laterality: Right;  . TOTAL KNEE ARTHROPLASTY Left 02/10/2013   Procedure: LEFT TOTAL KNEE ARTHROPLASTY;  Surgeon: Gearlean Alf, MD;  Location: [facility];  Service: Orthopedics;  Laterality: Left;  . total knee raplacement  01-2012   Right Knee  . TUBAL LIGATION  1997    Family History  Problem Relation Age of Onset  . Breast cancer Mother        sarcoma  . Lung cancer Mother   . Hypertension Mother   . Prostate cancer Father   . Congestive Heart Failure Father   . Heart attack Father   . Prostate cancer Brother   . Down syndrome Son   . Heart disease Maternal Grandfather        MI  . Stomach cancer Maternal Aunt   . Uterine cancer Maternal Aunt   . Colon cancer Neg Hx     Allergies  Allergen Reactions  . Dilaudid [Hydromorphone Hcl] Itching    Current Outpatient Medications on File Prior to Visit  Medication Sig Dispense Refill  . acetaminophen (TYLENOL) 500 MG tablet Take 500-1,000 mg by mouth every 6 (six) hours as needed for moderate pain or headache.     Marland Kitchen aspirin EC 81 MG tablet Take 81 mg by mouth daily.    . B Complex-C (SUPER B COMPLEX PO) Take 1 tablet by mouth daily.    . carvedilol (COREG) 25 MG tablet Take 0.5 tablets (12.5 mg total) by mouth 2 (two) times daily with a meal. Hold if your blood pressure is <120/80  30 tablet 0  . clonazePAM (KLONOPIN) 0.5 MG tablet TAKE 1/2 TO 1 TABLET BY MOUTH 3 TIMES A DAY AS NEEDED FOR ANXIETY 60 tablet 2  . ferrous sulfate 325 (65 FE) MG tablet Take 325 mg by mouth daily with breakfast.    . folic acid (FOLVITE) 1 MG tablet TAKE 1 TABLET BY MOUTH EVERY DAY 90 tablet 3  . gabapentin (NEURONTIN) 300 MG capsule TAKE 1 CAPSULE BY MOUTH TWICE A DAY (Patient taking differently: daily. ) 180 capsule 1  . hydrOXYzine (ATARAX/VISTARIL) 25 MG tablet Take 25 mg by mouth at bedtime.    . Multiple Vitamin (MULTIVITAMIN WITH MINERALS) TABS tablet Take 1 tablet by mouth daily.    Marland Kitchen nystatin (MYCOSTATIN/NYSTOP) powder Apply topically 4 (four) times daily. 15 g 0  . pentosan polysulfate (ELMIRON) 100 MG capsule Take 100 mg by mouth 2 (two) times daily.     . rosuvastatin (CRESTOR) 20 MG tablet TAKE 1 TABLET BY MOUTH EVERYDAY AT BEDTIME 90 tablet 3  . telmisartan (MICARDIS) 80 MG tablet TAKE 1 TABLET BY MOUTH EVERY DAY (Patient taking differently: 40 mg. ) 90 tablet 1  . venlafaxine XR (EFFEXOR-XR) 75 MG 24 hr capsule Take 2 capsules (150 mg total) by mouth daily with breakfast. 180 capsule 2   No current facility-administered medications on file prior to visit.     BP 119/65   Pulse 75   Temp (!) 97.5 F (36.4 C) (Oral)   Resp 16   Ht '5\' 1"'  (1.549 m)   Wt 178 lb (80.7 kg)   LMP 01/30/1994   SpO2 100%   BMI 33.63 kg/m       Objective:   Physical Exam  General Mental Status- Alert. General Appearance- Not in acute distress.  Skin General: Color- Normal Color. Moisture- Normal Moisture.  Neck Carotid Arteries- Normal color. Moisture- Normal Moisture. No carotid bruits. No JVD.  Chest and Lung Exam Auscultation: Breath Sounds:-Normal.  Cardiovascular Auscultation:Rythm- Regular. Murmurs & Other Heart Sounds:Auscultation of the heart reveals- No Murmurs.  Abdomen Inspection:-Inspeection Normal. Palpation/Percussion:Note:No mass. Palpation and Percussion of  the abdomen reveal- Non Tender, Non Distended + BS, no rebound or guarding.    Neurologic Cranial Nerve exam:- CN III-XII intact(No nystagmus), symmetric smile. Drift Test:- No drift. Romberg Exam:- Negative.  Finger to Nose:- Normal/Intact Strength:- 5/5 equal and symmetric strength both upper and lower extremities.      Assessment & Plan:  You do describe some chronic fatigue in the past but worse recently.  We will get lab work to evaluate this further to include CBC, CMP, TSH, B12, B1, vitamin D and iron level.  For history of interstitial cystitis, dysuria and above fatigue, will get urine sample today and go ahead and do culture.  Your mood has been depressed and will see if any findings of lab might be contributing factor.  Continue with Effexor.  I will send this note to your PCP and see if she might have some recommendations to improve your mood.  Also if you have not been to counseling and would like to consider that would recommend that you call counseling service.  Recent memory problems described.  This might be associated with your mood and stress level.  You did a Mini-Mental status exam today and scored 28 out of 30.  This is a baseline and if your memory worsens we could repeat this test or refer you for further psychologic testing.  Also will continue to be aware of your previous brain tumor.  You have any neurologic signs symptoms associated might repeat imaging studies.  Some occasional rare intermittent dizziness.  Good neurologic exam today.  You do have some mild transient dizziness on changing position.  Please make sure you get your balance before ambulating.  If you have dizziness with other signs symptoms as discussed then recommend ED for further evaluation.  Follow-up in 2 to 3 weeks with PCP or as needed.  40 minutes spent with pt. 50% of time spent counseling pt  Work up and plan for  depression, dizziness, memory issues and answering pt questions. In addition  MMSE done today.    Mackie Pai, PA-C

## 2017-12-13 NOTE — Patient Instructions (Addendum)
You do describe some chronic fatigue in the past but worse recently.  We will get lab work to evaluate this further to include CBC, CMP, TSH, B12, B1, vitamin D and iron level.  For history of interstitial cystitis, dysuria and above fatigue, will get urine sample today and go ahead and do culture.  Your mood has been depressed and will see if any findings of lab might be contributing factor.  Continue with Effexor.  I will send this note to your PCP and see if she might have some recommendations to improve your mood.  Also if you have not been to counseling and would like to consider that would recommend that you call counseling service.  Recent memory problems described.  This might be associated with your mood and stress level.  You did a Mini-Mental status exam today and scored 28 out of 30.  This is a baseline and if your memory worsens we could repeat this test or refer you for further psychologic testing.  Also will continue to be aware of your previous brain tumor.  You have any neurologic signs symptoms associated might repeat imaging studies.  Some occasional rare intermittent dizziness.  Good neurologic exam today.  You do have some mild transient dizziness on changing position.  Please make sure you get your balance before ambulating.  If you have dizziness with other signs symptoms as discussed then recommend ED for further evaluation.  Follow-up in 2 to 3 weeks with PCP or as needed.

## 2017-12-13 NOTE — Telephone Encounter (Signed)
Dr. Charlett Blake,  I wanted you to review note today. Labs showing no low/abnromal results that would indicate reason for her fatigue. She seems depressed about her sons health condition(talked a lot about this today. Wanted your advise on potential med to add on to effexor. She is already on 150 mg dose. Also on clonopin. Wanted you opinion. Also was considering giving her Terri number.  Thanks, Percell Miller

## 2017-12-14 ENCOUNTER — Encounter (HOSPITAL_BASED_OUTPATIENT_CLINIC_OR_DEPARTMENT_OTHER): Payer: Self-pay

## 2017-12-14 ENCOUNTER — Emergency Department (HOSPITAL_BASED_OUTPATIENT_CLINIC_OR_DEPARTMENT_OTHER): Payer: Medicare Other

## 2017-12-14 ENCOUNTER — Emergency Department (HOSPITAL_BASED_OUTPATIENT_CLINIC_OR_DEPARTMENT_OTHER)
Admission: EM | Admit: 2017-12-14 | Discharge: 2017-12-14 | Disposition: A | Discharge status: Home / Self Care | Payer: Medicare Other | Attending: Emergency Medicine | Admitting: Emergency Medicine

## 2017-12-14 DIAGNOSIS — E785 Hyperlipidemia, unspecified: Secondary | ICD-10-CM | POA: Insufficient documentation

## 2017-12-14 DIAGNOSIS — R197 Diarrhea, unspecified: Secondary | ICD-10-CM | POA: Diagnosis not present

## 2017-12-14 DIAGNOSIS — Z79899 Other long term (current) drug therapy: Secondary | ICD-10-CM | POA: Diagnosis not present

## 2017-12-14 DIAGNOSIS — R400 Somnolence: Secondary | ICD-10-CM | POA: Insufficient documentation

## 2017-12-14 DIAGNOSIS — R05 Cough: Secondary | ICD-10-CM | POA: Diagnosis not present

## 2017-12-14 DIAGNOSIS — R2681 Unsteadiness on feet: Secondary | ICD-10-CM | POA: Diagnosis not present

## 2017-12-14 DIAGNOSIS — I10 Essential (primary) hypertension: Secondary | ICD-10-CM | POA: Diagnosis not present

## 2017-12-14 DIAGNOSIS — R2689 Other abnormalities of gait and mobility: Secondary | ICD-10-CM | POA: Diagnosis not present

## 2017-12-14 DIAGNOSIS — R4020 Unspecified coma: Secondary | ICD-10-CM | POA: Diagnosis not present

## 2017-12-14 DIAGNOSIS — J9811 Atelectasis: Secondary | ICD-10-CM | POA: Diagnosis not present

## 2017-12-14 DIAGNOSIS — R531 Weakness: Secondary | ICD-10-CM | POA: Diagnosis not present

## 2017-12-14 DIAGNOSIS — M545 Low back pain: Secondary | ICD-10-CM | POA: Diagnosis not present

## 2017-12-14 LAB — CBC WITH DIFFERENTIAL/PLATELET
Abs Immature Granulocytes: 0.01 10*3/uL (ref 0.00–0.07)
BASOS ABS: 0 10*3/uL (ref 0.0–0.1)
Basophils Relative: 0 %
Eosinophils Absolute: 0.3 10*3/uL (ref 0.0–0.5)
Eosinophils Relative: 5 %
HEMATOCRIT: 38.8 % (ref 36.0–46.0)
HEMOGLOBIN: 12.1 g/dL (ref 12.0–15.0)
IMMATURE GRANULOCYTES: 0 %
LYMPHS ABS: 2.2 10*3/uL (ref 0.7–4.0)
LYMPHS PCT: 32 %
MCH: 28.7 pg (ref 26.0–34.0)
MCHC: 31.2 g/dL (ref 30.0–36.0)
MCV: 92.2 fL (ref 80.0–100.0)
Monocytes Absolute: 0.6 10*3/uL (ref 0.1–1.0)
Monocytes Relative: 9 %
NEUTROS PCT: 54 %
NRBC: 0 % (ref 0.0–0.2)
Neutro Abs: 3.8 10*3/uL (ref 1.7–7.7)
Platelets: 243 10*3/uL (ref 150–400)
RBC: 4.21 MIL/uL (ref 3.87–5.11)
RDW: 13.2 % (ref 11.5–15.5)
WBC: 7 10*3/uL (ref 4.0–10.5)

## 2017-12-14 LAB — COMPREHENSIVE METABOLIC PANEL
ALBUMIN: 4.3 g/dL (ref 3.5–5.0)
ALK PHOS: 82 U/L (ref 38–126)
ALT: 27 U/L (ref 0–44)
ANION GAP: 8 (ref 5–15)
AST: 29 U/L (ref 15–41)
BILIRUBIN TOTAL: 0.4 mg/dL (ref 0.3–1.2)
BUN: 15 mg/dL (ref 8–23)
CALCIUM: 9.3 mg/dL (ref 8.9–10.3)
CO2: 28 mmol/L (ref 22–32)
Chloride: 102 mmol/L (ref 98–111)
Creatinine, Ser: 0.81 mg/dL (ref 0.44–1.00)
GFR calc Af Amer: >60 mL/min (ref >60)
GFR calc non Af Amer: >60 mL/min (ref >60)
GLUCOSE: 117 mg/dL — AB (ref 70–99)
POTASSIUM: 3.4 mmol/L — AB (ref 3.5–5.1)
SODIUM: 138 mmol/L (ref 135–145)
TOTAL PROTEIN: 7 g/dL (ref 6.5–8.1)

## 2017-12-14 LAB — URINE CULTURE
MICRO NUMBER:: 91373127
SPECIMEN QUALITY:: ADEQUATE

## 2017-12-14 LAB — URINALYSIS, ROUTINE W REFLEX MICROSCOPIC
Bilirubin Urine: NEGATIVE
GLUCOSE, UA: NEGATIVE mg/dL
Hgb urine dipstick: NEGATIVE
Ketones, ur: NEGATIVE mg/dL
LEUKOCYTES UA: NEGATIVE
Nitrite: NEGATIVE
PH: 6 (ref 5.0–8.0)
Protein, ur: NEGATIVE mg/dL
Specific Gravity, Urine: <1.005 — ABNORMAL LOW (ref 1.005–1.030)

## 2017-12-14 LAB — TROPONIN I: Troponin I: <0.03 ng/mL (ref <0.03)

## 2017-12-14 LAB — AMMONIA: Ammonia: 14 umol/L (ref 9–35)

## 2017-12-14 MED ORDER — SODIUM CHLORIDE 0.9 % IV BOLUS
1000.0000 mL | Freq: Once | INTRAVENOUS | Status: AC
  Administered 2017-12-14: 1000 mL via INTRAVENOUS

## 2017-12-14 NOTE — ED Notes (Signed)
ED Provider at bedside. 

## 2017-12-14 NOTE — ED Notes (Signed)
Patient transported to CT 

## 2017-12-14 NOTE — Telephone Encounter (Signed)
Horton Finer would probably help her but she might have trouble getting her to be able to make the time. She is definitely depressed. Could increase Venlafaxine to 225 or stay at 150 mg and add Fluoxetine 10 mg at the opposite end of the day iif she is willing. Thanks

## 2017-12-14 NOTE — ED Provider Notes (Signed)
[facility] Provider Note   CSN: [id number] Arrival date & time: 12/14/17  1640     History   Chief Complaint Chief Complaint  Patient presents with  . Dizziness    HPI Shannon [name] is a 72 y.o. female.  He is here for feeling weak and unwell for about 1 week.  It started with possible flulike symptoms with some body aches and sleeping more.  She saw 1 of her primary care doctors yesterday and drove herself there.  She said she was bumping into things driving.  She has been unbalanced with her standing and walking.  She feels generalized weak all over.  She is had a dry cough and a little bit of diarrhea.  Otherwise she denies any fevers chills sore throat headache numbness focal weakness abdominal pain nausea or vomiting or urinary symptoms.  She has had some falls but denies any injury.  Her daughter states she is more confused and was having trouble figuring out her medications which she usually does not.   The history is provided by the patient and a relative.  Dizziness  Quality:  Imbalance Severity:  Moderate Onset quality:  Gradual Timing:  Intermittent Progression:  Worsening Chronicity:  New Context: physical activity and standing up   Relieved by:  Nothing Worsened by:  Movement Ineffective treatments:  None tried Associated symptoms: diarrhea and weakness   Associated symptoms: no blood in stool, no chest pain, no headaches, no nausea, no shortness of breath, no syncope, no tinnitus, no vision changes and no vomiting     Past Medical History:  Diagnosis Date  . 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 Anemia 08/28/2011  . Asthma    as a child  . Brain tumor ([place])   . Breast cancer ([place])    right breast  . Carpal tunnel syndrome of left wrist   . Cataracts, bilateral 12/28/2015  . Constipation 03/03/2012  . DEPRESSION 08/11/2008  . Dermatitis   . Facial fracture ([place])   . GERD 08/11/2008  . H/O measles   . H/O mumps     . History of blood transfusion   . History of chicken pox   . History of hiatal hernia   . 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 HYPERLIPIDEMIA 08/11/2008  . HYPERTENSION 08/11/2008  . Hyponatremia 08/28/2011  . 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 Interstitial cystitis   . LIPOMA 02/10/2009  . Medicare annual wellness visit, subsequent 12/27/2014   Follows with Dr Amalia Hailey of urology Follows with Dr Posey Pronto at [facility] for opthamology Follows with Dr [name] of dermatology Follows with [facility] gastroenterology, last scope done in 2011 repeat in 10 years Last [facility] July 2015 should repeat every 1-2 years Last Pap December 2015, repeat in 3 years.   . Melanoma ([place]) 2008   DR. Ronnald Ramp  . Neuropathy   . NICM (nonischemic cardiomyopathy) ([place]) 03/08/2010   likely 2/2 chemotx - a. Echo 2012: EF 45-50%;  b. Lex MV 2/12:  low risk, apical defect (small area of ischemia vs shifting breast atten);  c.  Echo 7/12: Normal wall thickness, EF 60-65%, normal wall motion, grade 1 diastolic dysfunction, mild LAE, PASP 32;   d. Lex MV 11/13:  EF 76%, no ischemia  . OA (osteoarthritis) 09/07/2011  . Obesity 09/28/2014  . Osteoporosis   . Personal history of chemotherapy  2012  . Personal history of radiation therapy 2012  . PREDIABETES 06/15/2009  . Recurrent falls 12/28/2015  . Shortness of breath   . UTI (lower urinary tract infection) 03/03/2012  . Vertebral fracture, osteoporotic, sequela 04/05/2016    Patient Active Problem List   Diagnosis Date Noted  . Closed fracture of maxilla, initial encounter ([place]) 10/17/2017  . Vitamin D deficiency 06/14/2017  . Vertebral fracture, osteoporotic, sequela 04/05/2016  . Low back pain 03/06/2016  . Chest pain 02/27/2016  . Syncope 02/27/2016  . Cataracts, bilateral 12/28/2015  . Recurrent falls 12/28/2015  . Genetic testing 11/29/2015  . Family history- stomach cancer 09/08/2015  . Family history of  cancer 09/08/2015  . [name] lesion 06/21/2015  . Left leg swelling 05/24/2015  . Medicare annual wellness visit, subsequent 12/27/2014  . 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 Obesity 09/28/2014  . Interstitial cystitis 09/28/2014  . Pain in joint, ankle and foot 09/28/2014  . Abnormal nuclear stress test 12/16/2013  . Sternal pain 11/06/2013  . Postoperative anemia due to acute blood loss 02/13/2013  . Preop cardiovascular exam 12/30/2012  . 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 Breast cancer of lower-inner quadrant of right female breast ([place]) 10/21/2012  . Insomnia 10/18/2012  . UTI (urinary tract infection) 03/03/2012  . Constipation 03/03/2012  . OA (osteoarthritis) 09/07/2011  . Dermatitis   . Hyponatremia 08/28/2011  . Osteoporosis 03/07/2011  . IC (interstitial cystitis)   . Melanoma ([place])   . Craniopharyngioma ([place]) 03/25/2010  . Prediabetes 06/15/2009  . LIPOMA 02/10/2009  . Dyspnea on exertion 02/10/2009  . Hyperlipidemia, mixed 08/11/2008  . Depression with anxiety 08/11/2008  . Essential hypertension 08/11/2008    Past Surgical History:  Procedure Laterality Date  . BREAST LUMPECTOMY  04/2009   RIGHT FOR BREAST CANCER-CHEMO/RADIATION X 1 YEAR  . BREAST REDUCTION SURGERY Bilateral 05/04/2014   Procedure: MAMMARY REDUCTION  (BREAST);  Surgeon: Cristine Polio, MD;  Location: [facility];  Service: Plastics;  Laterality: Bilateral;  . CARPAL TUNNEL RELEASE     L wrist, ulnar nerve moved  . CHOLECYSTECTOMY    . CRANIOTOMY FOR TUMOR  2000  . ELBOW SURGERY     left  . KNEE ARTHROSCOPY Left 10/12/2014   Procedure: LEFT KNEE ARTHROSCOPY ;  Surgeon: Gaynelle Arabian, MD;  Location: [facility];  Service: Orthopedics;  Laterality: Left;  . KYPHOPLASTY N/A 03/30/2016   Procedure: THORACIC 12 KYPHOPLASTY;  Surgeon: Phylliss Bob, MD;  Location: [facility];  Service: Orthopedics;  Laterality: N/A;  THORACIC 12  KYPHOPLASTY  . LEFT HEART CATHETERIZATION WITH CORONARY ANGIOGRAM N/A 12/16/2013   Procedure: LEFT HEART CATHETERIZATION WITH CORONARY ANGIOGRAM;  Surgeon: Peter M [name], MD;  Location: [facility] C[facility];  Service: Cardiovascular;  Laterality: N/A;  . LIPOMA EXCISION  03/28/2009   right leg  . MELANOMA EXCISION    . PORT-A-CATH REMOVAL  11/30/2010   [place]  . porta cath    . PORTACATH PLACEMENT  may 2011  . REDUCTION MAMMAPLASTY Bilateral   . SYNOVECTOMY Left 10/12/2014   Procedure: WITH SYNOVECTOMY;  Surgeon: Gaynelle Arabian, MD;  Location: [facility];  Service: Orthopedics;  Laterality: Left;  . TONSILLECTOMY  1958  . TOTAL KNEE ARTHROPLASTY  02/05/2012   Procedure: TOTAL KNEE ARTHROPLASTY;  Surgeon: Gearlean Alf, MD;  Location: [facility];  Service: Orthopedics;  Laterality: Right;  . TOTAL KNEE ARTHROPLASTY Left 02/10/2013   Procedure: LEFT TOTAL KNEE ARTHROPLASTY;  Surgeon: Dione Plover  Aluisio, MD;  Location: [facility];  Service: Orthopedics;  Laterality: Left;  . total knee raplacement  01-2012   Right Knee  . TUBAL LIGATION  1997     OB History    Gravida  3   Para  3   Term      Preterm      AB      Living  3     SAB      TAB      Ectopic      Multiple      Live Births               Home Medications    Prior to Admission medications   Medication Sig Start Date End Date Taking? Authorizing Provider  acetaminophen (TYLENOL) 500 MG tablet Take 500-1,000 mg by mouth every 6 (six) hours as needed for moderate pain or headache.     [provider]  aspirin EC 81 MG tablet Take 81 mg by mouth daily.    [provider]  B Complex-C (SUPER B COMPLEX PO) Take 1 tablet by mouth daily.    [provider]  carvedilol (COREG) 25 MG tablet Take 0.5 tablets (12.5 mg total) by mouth 2 (two) times daily with a meal. Hold if your blood pressure is <120/80 02/28/16   Mikhail, Velta Addison, DO  clonazePAM (KLONOPIN) 0.5 MG tablet TAKE 1/2 TO 1 TABLET BY MOUTH 3 TIMES A  DAY AS NEEDED FOR ANXIETY 10/16/17   Mosie Lukes, MD  ferrous sulfate 325 (65 FE) MG tablet Take 325 mg by mouth daily with breakfast.    [provider]  folic acid (FOLVITE) 1 MG tablet TAKE 1 TABLET BY MOUTH EVERY DAY 12/26/16   Mosie Lukes, MD  gabapentin (NEURONTIN) 300 MG capsule TAKE 1 CAPSULE BY MOUTH TWICE A DAY Patient taking differently: daily.  08/17/17   Mosie Lukes, MD  hydrOXYzine (ATARAX/VISTARIL) 25 MG tablet Take 25 mg by mouth at bedtime.    [provider]  Multiple Vitamin (MULTIVITAMIN WITH MINERALS) TABS tablet Take 1 tablet by mouth daily.    [provider]  nystatin (MYCOSTATIN/NYSTOP) powder Apply topically 4 (four) times daily. 08/07/16   Gardenia Phlegm, NP  pentosan polysulfate (ELMIRON) 100 MG capsule Take 100 mg by mouth 2 (two) times daily.  02/07/12   Perkins, Alexzandrew L, PA-C  rosuvastatin (CRESTOR) 20 MG tablet TAKE 1 TABLET BY MOUTH EVERYDAY AT BEDTIME 11/26/17   Mosie Lukes, MD  telmisartan (MICARDIS) 80 MG tablet TAKE 1 TABLET BY MOUTH EVERY DAY Patient taking differently: 40 mg.  05/29/17   Mosie Lukes, MD  venlafaxine XR (EFFEXOR-XR) 75 MG 24 hr capsule Take 2 capsules (150 mg total) by mouth daily with breakfast. 10/16/17   Mosie Lukes, MD    Family History Family History  Problem Relation Age of Onset  . Breast cancer Mother        sarcoma  . Lung cancer Mother   . Hypertension Mother   . Prostate cancer Father   . Congestive Heart Failure Father   . Heart attack Father   . Prostate cancer Brother   . Down syndrome Son   . Heart disease Maternal Grandfather        MI  . Stomach cancer Maternal Aunt   . Uterine cancer Maternal Aunt   . Colon cancer Neg Hx     Social History Social History   Tobacco Use  .  Smoking status: Never Smoker  . Smokeless tobacco: Never Used  Substance Use Topics  . Alcohol use: No  . Drug use: No     Allergies   Dilaudid [hydromorphone  hcl]   Review of Systems Review of Systems  Constitutional: Negative for fever.  HENT: Negative for sore throat and tinnitus.   Eyes: Negative for visual disturbance.  Respiratory: Negative for shortness of breath.   Cardiovascular: Negative for chest pain and syncope.  Gastrointestinal: Positive for diarrhea. Negative for abdominal pain, blood in stool, nausea and vomiting.  Genitourinary: Negative for dysuria.  Musculoskeletal: Negative for neck pain.  Skin: Negative for rash.  Neurological: Positive for dizziness and weakness. Negative for headaches.  Psychiatric/Behavioral: Positive for confusion.     Physical Exam Updated Vital Signs BP 123/62 (BP Location: Right Arm)   Pulse 68   Temp 97.7 F (36.5 C) (Oral)   Resp 16   Ht '5\' 1"'  (1.549 m)   Wt 79.8 kg   LMP 01/30/1994   SpO2 100%   BMI 33.25 kg/m   Physical Exam  Constitutional: She is oriented to person, place, and time. She appears well-developed and well-nourished. No distress.  HENT:  Head: Normocephalic and atraumatic.  Eyes: Conjunctivae are normal.  Neck: Neck supple.  Cardiovascular: Normal rate and regular rhythm.  No murmur heard. Pulmonary/Chest: Effort normal and breath sounds normal. No respiratory distress.  Abdominal: Soft. There is no tenderness.  Musculoskeletal: She exhibits no edema.  Neurological: She is alert and oriented to person, place, and time. She has normal strength. No cranial nerve deficit or sensory deficit. Gait abnormal. GCS eye subscore is 4. GCS verbal subscore is 5. GCS motor subscore is 6.  Walks with no assistance, but is slightly unsteady  Skin: Skin is warm and dry.  Psychiatric: She has a normal mood and affect.  Nursing note and vitals reviewed.    ED Treatments / Results  Labs (all labs ordered are listed, but only abnormal results are displayed) Labs Reviewed  COMPREHENSIVE METABOLIC PANEL - Abnormal; Notable for the following components:      Result Value    Potassium 3.4 (*)    Glucose, Bld 117 (*)    All other components within normal limits  URINALYSIS, ROUTINE W REFLEX MICROSCOPIC - Abnormal; Notable for the following components:   Specific Gravity, Urine <1.005 (*)    All other components within normal limits  CBC WITH DIFFERENTIAL/PLATELET  TROPONIN I  AMMONIA    EKG EKG Interpretation  Date/Time:  Friday December 14 2017 16:56:03 EST Ventricular Rate:  68 PR Interval:    QRS Duration: 97 QT Interval:  394 QTC Calculation: 419 R Axis:   -26 Text Interpretation:  Sinus rhythm Abnormal R-wave progression, late transition Inferior infarct, old similar to prior 5/19 Confirmed by Aletta Edouard [phone]) on 12/14/2017 5:15:24 PM   Radiology Dg Chest 2 View  Result Date: 12/14/2017 CLINICAL DATA:  Generalized weakness today. EXAM: CHEST - 2 VIEW COMPARISON:  06/07/2017 chest CT, 03/30/2016 CXR FINDINGS: Low lung volumes. Aortic atherosclerosis without aneurysm. Top-normal size heart. Streaky bibasilar atelectasis at the lung bases. No alveolar consolidation, effusion or pneumothorax. No pulmonary edema. Osteoarthritis of the [facility] and glenohumeral joints. Thoracic spondylosis with evidence of T12 thoracic kyphoplasty. IMPRESSION: Low lung volumes with bibasilar atelectasis. Aortic atherosclerosis. No active pulmonary disease. Electronically Signed   By: Ashley Royalty M.D.   On: 12/14/2017 18:20   Ct Head Wo Contrast  Result Date: 12/14/2017 CLINICAL DATA:  Sick  with flu like symptoms for a week. Altered level of consciousness. EXAM: CT HEAD WITHOUT CONTRAST TECHNIQUE: Contiguous axial images were obtained from the base of the skull through the vertex without intravenous contrast. COMPARISON:  06/07/2017 FINDINGS: Brain: Stable chronic involutional changes of the brain with chronic mild small vessel ischemic disease. No acute intracranial hemorrhage, midline shift or edema. No large vascular territory infarction. No intra-axial mass nor  extra-axial fluid collections. Midline fourth ventricle and basal cisterns. The brainstem and cerebellum appear nonacute. Vascular: No hyperdense vessel sign. Skull: Negative for acute fracture or focal lesions. Sinuses/Orbits: No acute finding. Other: Torus palatini of the hard palate, a normal developmental variant. 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 No acute intracranial abnormality. Electronically Signed   By: Ashley Royalty M.D.   On: 12/14/2017 18:24    Procedures Procedures (including critical care time)  Medications Ordered in ED Medications  sodium chloride 0.9 % bolus 1,000 mL ( Intravenous Stopped 12/14/17 1857)     Initial Impression / Assessment and Plan / ED Course  I have reviewed the triage vital signs and the nursing notes.  Pertinent labs & imaging results that were available during my care of the patient were reviewed by me and considered in my medical decision making (see chart for details).  Clinical Course as of Dec 15 1499  Fri Dec 14, 2017  1724 Patient with 1 week of progressive decline in her ability to be independent and ambulate.  She is just complaining of feeling tired all over and unsteady.  She had labs done at her PCPs office yesterday that looked fairly normal including a TSH.  Repeating most of the work-up and get a head CT troponin chest x-ray.    [MB]  5993 Patient's work-up here has been fairly unremarkable.  I reviewed the findings with the patient and the daughter.  I offered admission if she was worried that the confusion required that but they felt she could go home and she will keep an eye on her.  They understand to return if any worsening symptoms.   [MB]    Clinical Course User Index [MB] Hayden Rasmussen, MD     Final Clinical Impressions(s) / ED Diagnoses   Final diagnoses:  Unsteady gait  Somnolence    ED Discharge Orders    None       Hayden Rasmussen, MD 12/15/17 1501

## 2017-12-14 NOTE — ED Triage Notes (Signed)
Pt daughter states pt has been sick with flu like symptoms x1wk staying in bed. States having dizziness and confusion. States saw PCP this am and labs were fine. States pt is now stumbling around and more confused

## 2017-12-14 NOTE — Discharge Instructions (Addendum)
You were evaluated in the emergency department for increased sleepiness and unsteadiness on your feet.  You had lab work urinalysis and CAT scan of your head that did not show an obvious cause of your symptoms.  We discussed admitting you to the hospital but you felt you could go home.  Please follow-up with your doctor and return if any worsening symptoms.

## 2017-12-15 NOTE — Telephone Encounter (Signed)
Would you see Dr. Charlett Blake note regarding increasing venlafaxine and add fluoxetine.Confirm she has appointment with Terri. Let her know reviewed ED evaluation and work up that was done. If she is willing to follow Dr. Teofilo Pod advisement then would you send meds in or let me know and I will write scripts. Thanks.

## 2017-12-17 ENCOUNTER — Other Ambulatory Visit: Payer: Self-pay | Admitting: Family Medicine

## 2017-12-18 ENCOUNTER — Telehealth: Payer: Self-pay | Admitting: Family Medicine

## 2017-12-18 NOTE — Telephone Encounter (Signed)
Called patient several times no answer. Unable to leave voicemail.

## 2017-12-18 NOTE — Telephone Encounter (Signed)
Per Dr. Charlett Blake she wants to see the patient due to her recent Ed visit and also following up after seeing Percell Miller and possibly changing medications.  Dr. Charlett Blake has agreed to see patient this Friday 12/21/17 at 1pm. This will between 2 meetings that she has so it will be a quick visit.  I did call the patient and left a message for patient to call the office back if she is able to make this appointment.   Can one of you add her to 1pm om Friday if she agrees.  Thanks

## 2017-12-19 NOTE — Telephone Encounter (Signed)
Pt. Called [facility], confirming she can do 1PM on 11/22 with Dr. Charlett Blake. Appointment made.

## 2017-12-20 LAB — VITAMIN B1: Vitamin B1 (Thiamine): 17 nmol/L (ref 8–30)

## 2017-12-21 ENCOUNTER — Encounter: Payer: Self-pay | Admitting: Family Medicine

## 2017-12-21 ENCOUNTER — Ambulatory Visit (INDEPENDENT_AMBULATORY_CARE_PROVIDER_SITE_OTHER): Payer: Medicare Other | Admitting: Family Medicine

## 2017-12-21 VITALS — BP 128/82 | HR 78 | Temp 97.8°F | Resp 18 | Wt 181.0 lb

## 2017-12-21 DIAGNOSIS — N3010 Interstitial cystitis (chronic) without hematuria: Secondary | ICD-10-CM | POA: Diagnosis not present

## 2017-12-21 DIAGNOSIS — G4489 Other headache syndrome: Secondary | ICD-10-CM | POA: Diagnosis not present

## 2017-12-21 DIAGNOSIS — R350 Frequency of micturition: Secondary | ICD-10-CM | POA: Diagnosis not present

## 2017-12-21 DIAGNOSIS — R410 Disorientation, unspecified: Secondary | ICD-10-CM | POA: Diagnosis not present

## 2017-12-21 DIAGNOSIS — D332 Benign neoplasm of brain, unspecified: Secondary | ICD-10-CM

## 2017-12-21 DIAGNOSIS — M25562 Pain in left knee: Secondary | ICD-10-CM | POA: Diagnosis not present

## 2017-12-21 DIAGNOSIS — R7303 Prediabetes: Secondary | ICD-10-CM

## 2017-12-21 DIAGNOSIS — M549 Dorsalgia, unspecified: Secondary | ICD-10-CM

## 2017-12-21 DIAGNOSIS — R5381 Other malaise: Secondary | ICD-10-CM

## 2017-12-21 DIAGNOSIS — F418 Other specified anxiety disorders: Secondary | ICD-10-CM

## 2017-12-21 DIAGNOSIS — I10 Essential (primary) hypertension: Secondary | ICD-10-CM

## 2017-12-21 DIAGNOSIS — W19XXXA Unspecified fall, initial encounter: Secondary | ICD-10-CM

## 2017-12-21 LAB — COMPREHENSIVE METABOLIC PANEL
ALBUMIN: 4.3 g/dL (ref 3.5–5.2)
ALT: 20 U/L (ref 0–35)
AST: 21 U/L (ref 0–37)
Alkaline Phosphatase: 81 U/L (ref 39–117)
BILIRUBIN TOTAL: 0.4 mg/dL (ref 0.2–1.2)
BUN: 10 mg/dL (ref 6–23)
CO2: 33 mEq/L — ABNORMAL HIGH (ref 19–32)
Calcium: 9.6 mg/dL (ref 8.4–10.5)
Chloride: 104 mEq/L (ref 96–112)
Creatinine, Ser: 0.76 mg/dL (ref 0.40–1.20)
GFR: 79.17 mL/min (ref >60.00)
GLUCOSE: 91 mg/dL (ref 70–99)
Potassium: 4.3 mEq/L (ref 3.5–5.1)
Sodium: 143 mEq/L (ref 135–145)
TOTAL PROTEIN: 6.5 g/dL (ref 6.0–8.3)

## 2017-12-21 LAB — CBC WITH DIFFERENTIAL/PLATELET
BASOS ABS: 0.1 10*3/uL (ref 0.0–0.1)
Basophils Relative: 0.9 % (ref 0.0–3.0)
EOS ABS: 0.3 10*3/uL (ref 0.0–0.7)
Eosinophils Relative: 5.3 % — ABNORMAL HIGH (ref 0.0–5.0)
HCT: 37.6 % (ref 36.0–46.0)
HEMOGLOBIN: 12.4 g/dL (ref 12.0–15.0)
LYMPHS PCT: 27.4 % (ref 12.0–46.0)
Lymphs Abs: 1.6 10*3/uL (ref 0.7–4.0)
MCHC: 33 g/dL (ref 30.0–36.0)
MCV: 89.4 fl (ref 78.0–100.0)
Monocytes Absolute: 0.4 10*3/uL (ref 0.1–1.0)
Monocytes Relative: 7 % (ref 3.0–12.0)
Neutro Abs: 3.5 10*3/uL (ref 1.4–7.7)
Neutrophils Relative %: 59.4 % (ref 43.0–77.0)
Platelets: 281 10*3/uL (ref 150.0–400.0)
RBC: 4.2 Mil/uL (ref 3.87–5.11)
RDW: 13.8 % (ref 11.5–15.5)
WBC: 5.9 10*3/uL (ref 4.0–10.5)

## 2017-12-21 LAB — SEDIMENTATION RATE: Sed Rate: 3 mm/hr (ref 0–30)

## 2017-12-21 NOTE — Patient Instructions (Signed)

## 2017-12-23 DIAGNOSIS — M25562 Pain in left knee: Secondary | ICD-10-CM

## 2017-12-23 DIAGNOSIS — R5381 Other malaise: Secondary | ICD-10-CM

## 2017-12-23 DIAGNOSIS — G4489 Other headache syndrome: Secondary | ICD-10-CM | POA: Insufficient documentation

## 2017-12-23 HISTORY — DX: Other malaise: R53.81

## 2017-12-23 HISTORY — DX: Pain in left knee: M25.562

## 2017-12-23 NOTE — Progress Notes (Signed)
Subjective:    Patient ID: Shannon [name], female    DOB: [birth year], 72 y.o.   MRN: [id number]  No chief complaint on file.   HPI Patient is in today for ER follow-up.  She is doing better today but she had an episode of significant confusion and difficulty concentrating.  She was brought to the ER and no obvious cause was found.  She feels well today but does continue to struggle with fatigue as well as left knee pain and low back pain.  She continues to care for her debilitated son without much of a break.  Is having more frequent headaches as a result of her poor sleep.  Acknowledges anhedonia but denies suicidal ideation. Denies CP/palp/SOB/HA/congestion/fevers/GI or GU c/o. Taking meds as prescribed  Past Medical History:  Diagnosis Date  . 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 Anemia 08/28/2011  . Asthma    as a child  . Brain tumor ([place])   . Breast cancer ([place])    right breast  . Carpal tunnel syndrome of left wrist   . Cataracts, bilateral 12/28/2015  . Constipation 03/03/2012  . DEPRESSION 08/11/2008  . Dermatitis   . Facial fracture ([place])   . GERD 08/11/2008  . H/O measles   . H/O mumps   . History of blood transfusion   . History of chicken pox   . History of hiatal hernia   . 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 HYPERLIPIDEMIA 08/11/2008  . HYPERTENSION 08/11/2008  . Hyponatremia 08/28/2011  . 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 Interstitial cystitis   . LIPOMA 02/10/2009  . Medicare annual wellness visit, subsequent 12/27/2014   Follows with Dr Amalia Hailey of urology Follows with Dr Posey Pronto at [facility] for opthamology Follows with Dr [name] of dermatology Follows with [facility]ology, last scope done in 2011 repeat in 10 years Last [facility] July 2015 should repeat every 1-2 years Last Pap December 2015, repeat in 3 years.   . Melanoma ([place]) 2008   DR. Ronnald Ramp  . Neuropathy   . NICM  (nonischemic cardiomyopathy) ([place]) 03/08/2010   likely 2/2 chemotx - a. Echo 2012: EF 45-50%;  b. Lex MV 2/12:  low risk, apical defect (small area of ischemia vs shifting breast atten);  c.  Echo 7/12: Normal wall thickness, EF 60-65%, normal wall motion, grade 1 diastolic dysfunction, mild LAE, PASP 32;   d. Lex MV 11/13:  EF 76%, no ischemia  . OA (osteoarthritis) 09/07/2011  . Obesity 09/28/2014  . Osteoporosis   . Personal history of chemotherapy 2012  . Personal history of radiation therapy 2012  . PREDIABETES 06/15/2009  . Recurrent falls 12/28/2015  . Shortness of breath   . UTI (lower urinary tract infection) 03/03/2012  . Vertebral fracture, osteoporotic, sequela 04/05/2016    Past Surgical History:  Procedure Laterality Date  . BREAST LUMPECTOMY  04/2009   RIGHT FOR BREAST CANCER-CHEMO/RADIATION X 1 YEAR  . BREAST REDUCTION SURGERY Bilateral 05/04/2014   Procedure: MAMMARY REDUCTION  (BREAST);  Surgeon: Cristine Polio, MD;  Location: [facility];  Service: Plastics;  Laterality: Bilateral;  . CARPAL TUNNEL RELEASE     L wrist, ulnar nerve moved  . CHOLECYSTECTOMY    . CRANIOTOMY FOR TUMOR  2000  . ELBOW SURGERY     left  . KNEE ARTHROSCOPY Left 10/12/2014   Procedure: LEFT  KNEE ARTHROSCOPY ;  Surgeon: Gaynelle Arabian, MD;  Location: [facility];  Service: Orthopedics;  Laterality: Left;  . KYPHOPLASTY N/A 03/30/2016   Procedure: THORACIC 12 KYPHOPLASTY;  Surgeon: Phylliss Bob, MD;  Location: [facility];  Service: Orthopedics;  Laterality: N/A;  THORACIC 12 KYPHOPLASTY  . LEFT HEART CATHETERIZATION WITH CORONARY ANGIOGRAM N/A 12/16/2013   Procedure: LEFT HEART CATHETERIZATION WITH CORONARY ANGIOGRAM;  Surgeon: Peter M [name], MD;  Location: [facility] CATH [facility];  Service: Cardiovascular;  Laterality: N/A;  . LIPOMA EXCISION  03/28/2009   right leg  . MELANOMA EXCISION    . PORT-A-CATH REMOVAL  11/30/2010   [place]  . porta cath    . PORTACATH PLACEMENT  may 2011  . REDUCTION MAMMAPLASTY  Bilateral   . SYNOVECTOMY Left 10/12/2014   Procedure: WITH SYNOVECTOMY;  Surgeon: Gaynelle Arabian, MD;  Location: [facility];  Service: Orthopedics;  Laterality: Left;  . TONSILLECTOMY  1958  . TOTAL KNEE ARTHROPLASTY  02/05/2012   Procedure: TOTAL KNEE ARTHROPLASTY;  Surgeon: Gearlean Alf, MD;  Location: [facility];  Service: Orthopedics;  Laterality: Right;  . TOTAL KNEE ARTHROPLASTY Left 02/10/2013   Procedure: LEFT TOTAL KNEE ARTHROPLASTY;  Surgeon: Gearlean Alf, MD;  Location: [facility];  Service: Orthopedics;  Laterality: Left;  . total knee raplacement  01-2012   Right Knee  . TUBAL LIGATION  1997    Family History  Problem Relation Age of Onset  . Breast cancer Mother        sarcoma  . Lung cancer Mother   . Hypertension Mother   . Prostate cancer Father   . Congestive Heart Failure Father   . Heart attack Father   . Prostate cancer Brother   . Down syndrome Son   . Heart disease Maternal Grandfather        MI  . Stomach cancer Maternal Aunt   . Uterine cancer Maternal Aunt   . Colon cancer Neg Hx     Social History   Socioeconomic History  . Marital status: Married    Spouse name: Not on file  . Number of children: 3  . Years of education: Not on file  . Highest education level: Not on file  Occupational History  . Occupation: Copywriter, advertising: [employer]  Social Needs  . Financial resource strain: Not on file  . Food insecurity:    Worry: Not on file    Inability: Not on file  . Transportation needs:    Medical: Not on file    Non-medical: Not on file  Tobacco Use  . Smoking status: Never Smoker  . Smokeless tobacco: Never Used  Substance and Sexual Activity  . Alcohol use: No  . Drug use: No  . Sexual activity: Never  Lifestyle  . Physical activity:    Days per week: Not on file    Minutes per session: Not on file  . Stress: Not on file  Relationships  . Social connections:    Talks on phone: Not on file    Gets together: Not on file     Attends religious service: Not on file    Active member of club or organization: Not on file    Attends meetings of clubs or organizations: Not on file    Relationship status: Not on file  . Intimate partner violence:    Fear of current or ex partner: Not on file    Emotionally abused: Not on file    Physically  abused: Not on file    Forced sexual activity: Not on file  Other Topics Concern  . Not on file  Social History Narrative  . Not on file    Outpatient Medications Prior to Visit  Medication Sig Dispense Refill  . acetaminophen (TYLENOL) 500 MG tablet Take 500-1,000 mg by mouth every 6 (six) hours as needed for moderate pain or headache.     Marland Kitchen aspirin EC 81 MG tablet Take 81 mg by mouth daily.    . B Complex-C (SUPER B COMPLEX PO) Take 1 tablet by mouth daily.    . carvedilol (COREG) 25 MG tablet Take 0.5 tablets (12.5 mg total) by mouth 2 (two) times daily with a meal. Hold if your blood pressure is <120/80 30 tablet 0  . clonazePAM (KLONOPIN) 0.5 MG tablet TAKE 1/2 TO 1 TABLET BY MOUTH 3 TIMES A DAY AS NEEDED FOR ANXIETY 60 tablet 2  . ferrous sulfate 325 (65 FE) MG tablet Take 325 mg by mouth daily with breakfast.    . folic acid (FOLVITE) 1 MG tablet TAKE 1 TABLET BY MOUTH EVERY DAY 90 tablet 3  . gabapentin (NEURONTIN) 300 MG capsule TAKE 1 CAPSULE BY MOUTH TWICE A DAY (Patient taking differently: daily. ) 180 capsule 1  . hydrOXYzine (ATARAX/VISTARIL) 25 MG tablet Take 25 mg by mouth at bedtime.    . Multiple Vitamin (MULTIVITAMIN WITH MINERALS) TABS tablet Take 1 tablet by mouth daily.    Marland Kitchen nystatin (MYCOSTATIN/NYSTOP) powder Apply topically 4 (four) times daily. 15 g 0  . pentosan polysulfate (ELMIRON) 100 MG capsule Take 100 mg by mouth 2 (two) times daily.     . rosuvastatin (CRESTOR) 20 MG tablet TAKE 1 TABLET BY MOUTH EVERYDAY AT BEDTIME 90 tablet 3  . telmisartan (MICARDIS) 80 MG tablet TAKE 1 TABLET BY MOUTH EVERY DAY (Patient taking differently: 40 mg. ) 90 tablet  1  . venlafaxine XR (EFFEXOR-XR) 75 MG 24 hr capsule Take 2 capsules (150 mg total) by mouth daily with breakfast. 180 capsule 2   No facility-administered medications prior to visit.     Allergies  Allergen Reactions  . Dilaudid [Hydromorphone Hcl] Itching    Review of Systems  Constitutional: Positive for malaise/fatigue. Negative for fever.  HENT: Negative for congestion.   Eyes: Negative for blurred vision.  Respiratory: Negative for shortness of breath.   Cardiovascular: Negative for chest pain, palpitations and leg swelling.  Gastrointestinal: Negative for abdominal pain, blood in stool and nausea.  Genitourinary: Negative for dysuria and frequency.  Musculoskeletal: Positive for back pain, falls, joint pain and myalgias.  Skin: Negative for rash.  Neurological: Positive for headaches. Negative for dizziness and loss of consciousness.  Endo/Heme/Allergies: Negative for environmental allergies.  Psychiatric/Behavioral: Positive for depression and memory loss. Negative for hallucinations, substance abuse and suicidal ideas. The patient is nervous/anxious and has insomnia.        Objective:    Physical Exam  Constitutional: She is oriented to person, place, and time. She appears well-developed and well-nourished. No distress.  HENT:  Head: Normocephalic and atraumatic.  Nose: Nose normal.  Eyes: Right eye exhibits no discharge. Left eye exhibits no discharge.  Neck: Normal range of motion. Neck supple.  Cardiovascular: Normal rate and regular rhythm.  No murmur heard. Pulmonary/Chest: Effort normal and breath sounds normal.  Abdominal: Soft. Bowel sounds are normal. There is no tenderness.  Musculoskeletal: She exhibits no edema.  Neurological: She is alert and oriented to person, place, and time.  Skin: Skin is warm and dry.  Psychiatric: She has a normal mood and affect.  Nursing note and vitals reviewed.   BP 128/82 (BP Location: Left Arm, Patient Position:  Sitting, Cuff Size: Normal)   Pulse 78   Temp 97.8 F (36.6 C) (Oral)   Resp 18   Wt 181 lb (82.1 kg)   LMP 01/30/1994   SpO2 94%   BMI 34.20 kg/m  Wt Readings from Last 3 Encounters:  12/21/17 181 lb (82.1 kg)  12/14/17 176 lb (79.8 kg)  12/13/17 178 lb (80.7 kg)     Lab Results  Component Value Date   WBC 5.9 12/21/2017   HGB 12.4 12/21/2017   HCT 37.6 12/21/2017   PLT 281.0 12/21/2017   GLUCOSE 91 12/21/2017   CHOL 161 06/14/2017   TRIG 168.0 (H) 06/14/2017   HDL 52.40 06/14/2017   LDLDIRECT 98.0 11/04/2015   LDLCALC 75 06/14/2017   ALT 20 12/21/2017   AST 21 12/21/2017   NA 143 12/21/2017   K 4.3 12/21/2017   CL 104 12/21/2017   CREATININE 0.76 12/21/2017   BUN 10 12/21/2017   CO2 33 (H) 12/21/2017   TSH 1.57 12/13/2017   INR 1.06 03/30/2016   HGBA1C 5.7 06/14/2017    Lab Results  Component Value Date   TSH 1.57 12/13/2017   Lab Results  Component Value Date   WBC 5.9 12/21/2017   HGB 12.4 12/21/2017   HCT 37.6 12/21/2017   MCV 89.4 12/21/2017   PLT 281.0 12/21/2017   Lab Results  Component Value Date   NA 143 12/21/2017   K 4.3 12/21/2017   CHLORIDE 107 08/07/2016   CO2 33 (H) 12/21/2017   GLUCOSE 91 12/21/2017   BUN 10 12/21/2017   CREATININE 0.76 12/21/2017   BILITOT 0.4 12/21/2017   ALKPHOS 81 12/21/2017   AST 21 12/21/2017   ALT 20 12/21/2017   PROT 6.5 12/21/2017   ALBUMIN 4.3 12/21/2017   CALCIUM 9.6 12/21/2017   ANIONGAP 8 12/14/2017   EGFR 72 (L) 08/07/2016   GFR 79.17 12/21/2017   Lab Results  Component Value Date   CHOL 161 06/14/2017   Lab Results  Component Value Date   HDL 52.40 06/14/2017   Lab Results  Component Value Date   LDLCALC 75 06/14/2017   Lab Results  Component Value Date   TRIG 168.0 (H) 06/14/2017   Lab Results  Component Value Date   CHOLHDL 3 06/14/2017   Lab Results  Component Value Date   HGBA1C 5.7 06/14/2017       Assessment & Plan:   Problem List Items Addressed This Visit     Depression with anxiety    Very worn down as she cares for her ailing adult son with disabilities. She declines medications but encouraged to try and get increased sleep and consider trying to get more help and rest.       Essential hypertension - Primary    Well controlled, no changes to meds. Encouraged heart healthy diet such as the DASH diet and exercise as tolerated.       Relevant Orders   Comprehensive metabolic panel (Completed)   CBC w/Diff (Completed)   Prediabetes     minimize simple carbs. Increase exercise as tolerated.       IC (interstitial cystitis)   Interstitial cystitis    UA and culture negative. Hydrate well      Left-sided back pain    Encouraged moist heat and gentle stretching as  tolerated. May try NSAIDs and prescription meds as directed and report if symptoms worsen or seek immediate care. Referred to PT      Relevant Orders   Ambulatory referral to Physical Therapy   Other headache syndrome    Has had increased stress and headaches as she is unable to rest.shehad a recent episode of confusion and weakness but it has not recurred. MRI and labs are ordered and she is referred to neurology for further consideration. She reports she had a benign brain tumor removed many years ago prior to establishing care here. Will try and obtain records.       Relevant Orders   MR Brain W Wo Contrast   Ambulatory referral to Neurology   Debility    Referred to Physical therapy       Relevant Orders   Ambulatory referral to Physical Therapy   Left knee pain    gentle stretching as tolerated. May try NSAIDs and prescription meds as directed and report if symptoms worsen or seek immediate care. Referred to PT      Relevant Orders   Ambulatory referral to Physical Therapy    Other Visit Diagnoses    Urinary frequency       Relevant Orders   Sedimentation rate (Completed)   Urinalysis   Urine Culture   Benign neoplasm of brain, unspecified brain region [facility])        Relevant Orders   MR Brain W Wo Contrast   Ambulatory referral to Neurology   Fall, initial encounter       Relevant Orders   MR Brain W Wo Contrast   Ambulatory referral to Neurology   Ambulatory referral to Physical Therapy   Confusion       Relevant Orders   MR Brain W Wo Contrast   Ambulatory referral to Neurology   Urinalysis   Urine Culture      I am having Alycia Patten. Crandall maintain her hydrOXYzine, pentosan polysulfate, B Complex-C (SUPER B COMPLEX PO), acetaminophen, ferrous sulfate, multivitamin with minerals, carvedilol, nystatin, aspirin EC, telmisartan, gabapentin, clonazePAM, venlafaxine XR, rosuvastatin, and folic acid.  No orders of the defined types were placed in this encounter.    Penni Homans, MD

## 2017-12-23 NOTE — Assessment & Plan Note (Signed)
gentle stretching as tolerated. May try NSAIDs and prescription meds as directed and report if symptoms worsen or seek immediate care. Referred to PT

## 2017-12-23 NOTE — Assessment & Plan Note (Signed)
Very worn down as she cares for her ailing adult son with disabilities. She declines medications but encouraged to try and get increased sleep and consider trying to get more help and rest.

## 2017-12-23 NOTE — Assessment & Plan Note (Signed)
minimize simple carbs. Increase exercise as tolerated.  

## 2017-12-23 NOTE — Assessment & Plan Note (Signed)
UA and culture negative. Hydrate well

## 2017-12-23 NOTE — Assessment & Plan Note (Signed)
Referred to Physical therapy.

## 2017-12-23 NOTE — Assessment & Plan Note (Addendum)
Has had increased stress and headaches as she is unable to rest.shehad a recent episode of confusion and weakness but it has not recurred. MRI and labs are ordered and she is referred to neurology for further consideration. She reports she had a benign brain tumor removed many years ago prior to establishing care here. Will try and obtain records.

## 2017-12-23 NOTE — Assessment & Plan Note (Signed)
Well controlled, no changes to meds. Encouraged heart healthy diet such as the DASH diet and exercise as tolerated.  °

## 2017-12-23 NOTE — Assessment & Plan Note (Signed)
Encouraged moist heat and gentle stretching as tolerated. May try NSAIDs and prescription meds as directed and report if symptoms worsen or seek immediate care. Referred to PT 

## 2017-12-24 ENCOUNTER — Telehealth: Payer: Self-pay | Admitting: Family Medicine

## 2017-12-24 NOTE — Telephone Encounter (Signed)
Copied from [place] [id number]. Topic: Quick Communication - See Telephone Encounter >> Dec 24, 2017  2:14 PM Blase Mess A wrote: CRM for notification. See Telephone encounter for: 12/24/17.  Helene Kelp from [facility] is calling because they received Medical records on the patient but they do not have the patient on file. Please advise [phone]

## 2017-12-24 NOTE — Progress Notes (Signed)
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  History supplemented by ED and referring providers notes.  About 3 weeks ago, she developed body aches with diffuse weakness and daytime drowsiness.  She felt unsteady on her feet.  While driving, she noted she was veering off the road.  B1, vitamin D, TSH and B12 were unremarkable.  She was having trouble managing her medications.  She presented to the ED on 12/14/17 for further evaluation.  CBC, CMP, ammonia and UA were all unremarkable.  CT of head was personally reviewed and revealed chronic small vessel ischemic changes but no acute abnormality.  She feels dizzy and reports some short term memory deficits.  She reports difficulty with names or keep track of medications. She reports significant depression and anxiety caring for her son with Down syndrome who is disabled since a stroke 2 years ago. She has an MRI of the brain scheduled later this week.  PAST MEDICAL HISTORY: Past Medical History:  Diagnosis Date  . 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 Anemia 08/28/2011  . Asthma    as a child  . Brain tumor ([place])   . Breast cancer ([place])    right breast  . Carpal tunnel syndrome of left wrist   . Cataracts, bilateral 12/28/2015  . Constipation 03/03/2012  . DEPRESSION 08/11/2008  . Dermatitis   . Facial fracture ([place])   . GERD 08/11/2008  . H/O measles   . H/O mumps   . History of blood transfusion   . History of chicken pox   . History of hiatal hernia   . Hx breast cancer, IDC, Right,  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/2009   BRCA 2 NEGATIVE, CHEMO AND RADIATION X 1 YR  . HYPERLIPIDEMIA 08/11/2008  . HYPERTENSION 08/11/2008  . Hyponatremia 08/28/2011  . 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 Interstitial cystitis   . LIPOMA 02/10/2009  . Medicare annual wellness visit, subsequent 12/27/2014   Follows with Dr Amalia Hailey of urology Follows with Dr Posey Pronto at [facility] for opthamology Follows with Dr [name] of dermatology Follows with [facility] gastroenterology, last scope done in 2011 repeat in 10 years Last [facility] July 2015 should repeat every 1-2 years Last Pap December 2015, repeat in 3 years.   . Melanoma ([place]) 2008   DR. Ronnald Ramp  . Neuropathy   . NICM (nonischemic cardiomyopathy) ([place]) 03/08/2010   likely 2/2 chemotx - a. Echo 2012: EF 45-50%;  b. Lex MV 2/12:  low risk, apical defect (small area of ischemia vs shifting breast atten);  c.  Echo 7/12: Normal wall thickness, EF 60-65%, normal wall motion, grade 1 diastolic dysfunction, mild LAE, PASP 32;   d. Lex MV 11/13:  EF 76%, no ischemia  . OA (osteoarthritis) 09/07/2011  . Obesity 09/28/2014  . Osteoporosis   . Personal history of chemotherapy 2012  . Personal history of radiation therapy 2012  . PREDIABETES 06/15/2009  . Recurrent falls 12/28/2015  . Shortness of breath   . UTI (lower urinary tract infection) 03/03/2012  . Vertebral fracture, osteoporotic, sequela 04/05/2016  PAST SURGICAL HISTORY: Past Surgical History:  Procedure Laterality Date  . BREAST LUMPECTOMY  04/2009   RIGHT FOR BREAST CANCER-CHEMO/RADIATION X 1 YEAR  . BREAST REDUCTION SURGERY Bilateral 05/04/2014   Procedure: MAMMARY REDUCTION  (BREAST);  Surgeon: Cristine Polio, MD;  Location: [facility];  Service: Plastics;  Laterality: Bilateral;  . CARPAL TUNNEL RELEASE     L wrist, ulnar nerve moved  . CHOLECYSTECTOMY    . CRANIOTOMY FOR TUMOR  2000  . ELBOW SURGERY     left  . KNEE ARTHROSCOPY Left 10/12/2014   Procedure: LEFT KNEE ARTHROSCOPY ;  Surgeon:  Gaynelle Arabian, MD;  Location: [facility];  Service: Orthopedics;  Laterality: Left;  . KYPHOPLASTY N/A 03/30/2016   Procedure: THORACIC 12 KYPHOPLASTY;  Surgeon: Phylliss Bob, MD;  Location: [facility];  Service: Orthopedics;  Laterality: N/A;  THORACIC 12 KYPHOPLASTY  . LEFT HEART CATHETERIZATION WITH CORONARY ANGIOGRAM N/A 12/16/2013   Procedure: LEFT HEART CATHETERIZATION WITH CORONARY ANGIOGRAM;  Surgeon: Peter M [name], MD;  Location: [facility] CATH LAB;  Service: Cardiovascular;  Laterality: N/A;  . LIPOMA EXCISION  03/28/2009   right leg  . MELANOMA EXCISION    . PORT-A-CATH REMOVAL  11/30/2010   [place]  . porta cath    . PORTACATH PLACEMENT  may 2011  . REDUCTION MAMMAPLASTY Bilateral   . SYNOVECTOMY Left 10/12/2014   Procedure: WITH SYNOVECTOMY;  Surgeon: Gaynelle Arabian, MD;  Location: [facility];  Service: Orthopedics;  Laterality: Left;  . TONSILLECTOMY  1958  . TOTAL KNEE ARTHROPLASTY  02/05/2012   Procedure: TOTAL KNEE ARTHROPLASTY;  Surgeon: Gearlean Alf, MD;  Location: [facility];  Service: Orthopedics;  Laterality: Right;  . TOTAL KNEE ARTHROPLASTY Left 02/10/2013   Procedure: LEFT TOTAL KNEE ARTHROPLASTY;  Surgeon: Gearlean Alf, MD;  Location: [facility];  Service: Orthopedics;  Laterality: Left;  . total knee raplacement  01-2012   Right Knee  . TUBAL LIGATION  1997    MEDICATIONS: Current Outpatient Medications on File Prior to Visit  Medication Sig Dispense Refill  . acetaminophen (TYLENOL) 500 MG tablet Take 500-1,000 mg by mouth every 6 (six) hours as needed for moderate pain or headache.     Marland Kitchen aspirin EC 81 MG tablet Take 81 mg by mouth daily.    . B Complex-C (SUPER B COMPLEX PO) Take 1 tablet by mouth daily.    . carvedilol (COREG) 25 MG tablet Take 0.5 tablets (12.5 mg total) by mouth 2 (two) times daily with a meal. Hold if your blood pressure is <120/80 30 tablet 0  . clonazePAM (KLONOPIN) 0.5 MG tablet TAKE 1/2 TO 1 TABLET BY MOUTH 3 TIMES A DAY AS NEEDED FOR ANXIETY 60 tablet 2  .  ferrous sulfate 325 (65 FE) MG tablet Take 325 mg by mouth daily with breakfast.    . folic acid (FOLVITE) 1 MG tablet TAKE 1 TABLET BY MOUTH EVERY DAY 90 tablet 3  . gabapentin (NEURONTIN) 300 MG capsule TAKE 1 CAPSULE BY MOUTH TWICE A DAY (Patient taking differently: daily. ) 180 capsule 1  . hydrOXYzine (ATARAX/VISTARIL) 25 MG tablet Take 25 mg by mouth at bedtime.    . Multiple Vitamin (MULTIVITAMIN WITH MINERALS) TABS tablet Take 1 tablet by mouth daily.    Marland Kitchen nystatin (MYCOSTATIN/NYSTOP) powder Apply topically 4 (four) times daily. 15 g 0  . pentosan polysulfate (ELMIRON) 100 MG capsule Take 100 mg by mouth 2 (two) times daily.     . rosuvastatin (CRESTOR) 20  MG tablet TAKE 1 TABLET BY MOUTH EVERYDAY AT BEDTIME 90 tablet 3  . telmisartan (MICARDIS) 80 MG tablet TAKE 1 TABLET BY MOUTH EVERY DAY (Patient taking differently: 40 mg. ) 90 tablet 1  . venlafaxine XR (EFFEXOR-XR) 75 MG 24 hr capsule Take 2 capsules (150 mg total) by mouth daily with breakfast. 180 capsule 2   No current facility-administered medications on file prior to visit.     ALLERGIES: Allergies  Allergen Reactions  . Dilaudid [Hydromorphone Hcl] Itching    FAMILY HISTORY: Family History  Problem Relation Age of Onset  . Breast cancer Mother        sarcoma  . Lung cancer Mother   . Hypertension Mother   . Prostate cancer Father   . Congestive Heart Failure Father   . Heart attack Father   . Prostate cancer Brother   . Down syndrome Son   . Heart disease Maternal Grandfather        MI  . Stomach cancer Maternal Aunt   . Uterine cancer Maternal Aunt   . Colon cancer Neg Hx    SOCIAL HISTORY: Social History   Socioeconomic History  . Marital status: Married    Spouse name: Not on file  . Number of children: 3  . Years of education: Not on file  . Highest education level: Not on file  Occupational History  . Occupation: Copywriter, advertising: [employer]  Social Needs  . Financial resource  strain: Not on file  . Food insecurity:    Worry: Not on file    Inability: Not on file  . Transportation needs:    Medical: Not on file    Non-medical: Not on file  Tobacco Use  . Smoking status: Never Smoker  . Smokeless tobacco: Never Used  Substance and Sexual Activity  . Alcohol use: No  . Drug use: No  . Sexual activity: Never  Lifestyle  . Physical activity:    Days per week: Not on file    Minutes per session: Not on file  . Stress: Not on file  Relationships  . Social connections:    Talks on phone: Not on file    Gets together: Not on file    Attends religious service: Not on file    Active member of club or organization: Not on file    Attends meetings of clubs or organizations: Not on file    Relationship status: Not on file  . Intimate partner violence:    Fear of current or ex partner: Not on file    Emotionally abused: Not on file    Physically abused: Not on file    Forced sexual activity: Not on file  Other Topics Concern  . Not on file  Social History Narrative  . Not on file    REVIEW OF SYSTEMS: Constitutional: No fevers, chills, or sweats, no generalized fatigue, change in appetite Eyes: No visual changes, double vision, eye pain Ear, nose and throat: No hearing loss, ear pain, nasal congestion, sore throat Cardiovascular: No chest pain, palpitations Respiratory:  No shortness of breath at rest or with exertion, wheezes GastrointestinaI: No nausea, vomiting, diarrhea, abdominal pain, fecal incontinence Genitourinary:  No dysuria, urinary retention or frequency Musculoskeletal:  No neck pain, back pain Integumentary: No rash, pruritus, skin lesions Neurological: as above Psychiatric: No depression, insomnia, anxiety Endocrine: No palpitations, fatigue, diaphoresis, mood swings, change in appetite, change in weight, increased thirst Hematologic/Lymphatic:  No purpura, petechiae. Allergic/Immunologic:  no itchy/runny eyes, nasal congestion, recent  allergic reactions, rashes  PHYSICAL EXAM: Blood pressure 110/68, pulse 73, height _0  (1.6 m), weight 181 lb (82.1 kg), last menstrual period 01/30/1994, SpO2 96 %. General: No acute distress.  Patient appears well-groomed.   Head:  Normocephalic/atraumatic Eyes:  fundi examined but not visualized Neck: supple, no paraspinal tenderness, full range of motion Back: No paraspinal tenderness Heart: regular rate and rhythm Lungs: Clear to auscultation bilaterally. Vascular: No carotid bruits. Neurological Exam: Mental status: alert and oriented to person, place, and time, delayed recall 2 of 3 words after 5 minutes, remote memory intact, fund of knowledge intact, attention and concentration intact, speech fluent and not dysarthric, language intact. Cranial nerves: CN I: not tested CN II: pupils equal, round and reactive to light, visual fields intact CN III, IV, VI:  full range of motion, no nystagmus, no ptosis CN V: facial sensation intact CN VII: upper and lower face symmetric CN VIII: hearing intact CN IX, X: gag intact, uvula midline CN XI: sternocleidomastoid and trapezius muscles intact CN XII: tongue midline Bulk & Tone: normal, no fasciculations. Motor:  5/5 throughout  Sensation:  temperature and vibration sensation intact.   Deep Tendon Reflexes:  2+ throughout, toes downgoing.   Finger to nose testing:  Without dysmetria.  Heel to shin:  Without dysmetria.  Gait:  Normal station and stride.  Romberg negative.  IMPRESSION: Confusion History of craniopharyngioma History of breast cancer  Symptoms likely related to anxiety.  However, given her past medical history, we will check MRI  PLAN: MRI of brain/pituitary gland with and without contrast Further recommendations pending results.  Otherwise, follow up as needed.  Thank you for allowing me to take part in the care of this patient  Metta Clines, DO  CC: Penni Homans, MD

## 2017-12-25 ENCOUNTER — Ambulatory Visit: Payer: Medicare Other | Admitting: Psychology

## 2017-12-25 ENCOUNTER — Encounter: Payer: Self-pay | Admitting: Neurology

## 2017-12-25 ENCOUNTER — Ambulatory Visit (INDEPENDENT_AMBULATORY_CARE_PROVIDER_SITE_OTHER): Payer: Medicare Other | Admitting: Neurology

## 2017-12-25 VITALS — BP 110/68 | HR 73 | Ht 63.0 in | Wt 181.0 lb

## 2017-12-25 DIAGNOSIS — D444 Neoplasm of uncertain behavior of craniopharyngeal duct: Secondary | ICD-10-CM

## 2017-12-25 DIAGNOSIS — Z853 Personal history of malignant neoplasm of breast: Secondary | ICD-10-CM | POA: Diagnosis not present

## 2017-12-25 DIAGNOSIS — R410 Disorientation, unspecified: Secondary | ICD-10-CM | POA: Diagnosis not present

## 2017-12-25 NOTE — Addendum Note (Signed)
Addended byTomi Likens, ADAM R on: 12/25/2017 03:45 PM   Modules accepted: Orders

## 2017-12-25 NOTE — Patient Instructions (Addendum)
1.  We will check MRI of brain and pituitary gland with and without contrast 2.  Further recommendations pending results.  We have sent a referral to [facility] for your MRI and they will call you directly to schedule your appt. They are located at [place]. If you need to contact them directly please call [phone].

## 2017-12-29 ENCOUNTER — Ambulatory Visit (HOSPITAL_BASED_OUTPATIENT_CLINIC_OR_DEPARTMENT_OTHER)
Admission: RE | Admit: 2017-12-29 | Discharge: 2017-12-29 | Disposition: A | Discharge status: Home / Self Care | Payer: Medicare Other | Source: Ambulatory Visit | Attending: Family Medicine | Admitting: Family Medicine

## 2017-12-29 DIAGNOSIS — W19XXXA Unspecified fall, initial encounter: Secondary | ICD-10-CM | POA: Insufficient documentation

## 2017-12-29 DIAGNOSIS — D332 Benign neoplasm of brain, unspecified: Secondary | ICD-10-CM | POA: Insufficient documentation

## 2017-12-29 DIAGNOSIS — G4489 Other headache syndrome: Secondary | ICD-10-CM

## 2017-12-29 DIAGNOSIS — R410 Disorientation, unspecified: Secondary | ICD-10-CM | POA: Diagnosis not present

## 2017-12-29 DIAGNOSIS — R296 Repeated falls: Secondary | ICD-10-CM | POA: Diagnosis not present

## 2017-12-29 MED ORDER — GADOBENATE DIMEGLUMINE 529 MG/ML IV SOLN
10.0000 mL | Freq: Once | INTRAVENOUS | Status: AC | PRN
  Administered 2017-12-29: 9 mL via INTRAVENOUS

## 2018-01-03 ENCOUNTER — Encounter: Payer: Self-pay | Admitting: Physical Therapy

## 2018-01-03 ENCOUNTER — Ambulatory Visit: Payer: Medicare Other | Attending: Family Medicine | Admitting: Physical Therapy

## 2018-01-03 ENCOUNTER — Other Ambulatory Visit: Payer: Self-pay

## 2018-01-03 DIAGNOSIS — G8929 Other chronic pain: Secondary | ICD-10-CM | POA: Insufficient documentation

## 2018-01-03 DIAGNOSIS — R262 Difficulty in walking, not elsewhere classified: Secondary | ICD-10-CM | POA: Insufficient documentation

## 2018-01-03 DIAGNOSIS — M6281 Muscle weakness (generalized): Secondary | ICD-10-CM | POA: Diagnosis not present

## 2018-01-03 DIAGNOSIS — R293 Abnormal posture: Secondary | ICD-10-CM

## 2018-01-03 DIAGNOSIS — M545 Low back pain: Secondary | ICD-10-CM | POA: Diagnosis not present

## 2018-01-03 NOTE — Therapy (Signed)
[facility] [address]  [place], [place], [address] Phone: [phone]   Fax:  [phone]  Physical Therapy Evaluation  Patient Details  Name: Shannon [name] MRN: [id number] Date of Birth: 10/10/1945 Referring Provider (PT): Penni Homans, MD   Encounter Date: 01/03/2018  PT End of Session - 01/03/18 1202    Visit Number  1    Number of Visits  13    Date for PT Re-Evaluation  02/14/18    Authorization Type  Medicare & BCBS    PT Start Time  3299    PT Stop Time  1100    PT Time Calculation (min)  45 min    Activity Tolerance  Patient tolerated treatment well;Patient limited by pain    Behavior During Therapy  [facility] for tasks assessed/performed       Past Medical History:  Diagnosis Date  . 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 Anemia 08/28/2011  . Asthma    as a child  . Brain tumor ([place])   . Breast cancer ([place])    right breast  . Carpal tunnel syndrome of left wrist   . Cataracts, bilateral 12/28/2015  . Constipation 03/03/2012  . DEPRESSION 08/11/2008  . Dermatitis   . Facial fracture ([place])   . GERD 08/11/2008  . H/O measles   . H/O mumps   . History of blood transfusion   . History of chicken pox   . History of hiatal hernia   . 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 HYPERLIPIDEMIA 08/11/2008  . HYPERTENSION 08/11/2008  . Hyponatremia 08/28/2011  . 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 Interstitial cystitis   . LIPOMA 02/10/2009  . Medicare annual wellness visit, subsequent 12/27/2014   Follows with Dr Amalia Hailey of urology Follows with Dr Posey Pronto at [facility] for opthamology Follows with Dr [name] of dermatology Follows with [facility] gastroenterology, last scope done in 2011 repeat in 10 years Last [facility] July 2015 should repeat every 1-2 years Last Pap December 2015, repeat in 3 years.   . Melanoma ([place]) 2008   DR. Ronnald Ramp  .  Neuropathy   . NICM (nonischemic cardiomyopathy) ([place]) 03/08/2010   likely 2/2 chemotx - a. Echo 2012: EF 45-50%;  b. Lex MV 2/12:  low risk, apical defect (small area of ischemia vs shifting breast atten);  c.  Echo 7/12: Normal wall thickness, EF 60-65%, normal wall motion, grade 1 diastolic dysfunction, mild LAE, PASP 32;   d. Lex MV 11/13:  EF 76%, no ischemia  . OA (osteoarthritis) 09/07/2011  . Obesity 09/28/2014  . Osteoporosis   . Personal history of chemotherapy 2012  . Personal history of radiation therapy 2012  . PREDIABETES 06/15/2009  . Recurrent falls 12/28/2015  . Shortness of breath   . UTI (lower urinary tract infection) 03/03/2012  . Vertebral fracture, osteoporotic, sequela 04/05/2016    Past Surgical History:  Procedure Laterality Date  . BREAST LUMPECTOMY  04/2009   RIGHT FOR BREAST CANCER-CHEMO/RADIATION X 1 YEAR  . BREAST REDUCTION SURGERY Bilateral 05/04/2014   Procedure: MAMMARY REDUCTION  (BREAST);  Surgeon: Cristine Polio, MD;  Location: [facility];  Service: Plastics;  Laterality: Bilateral;  . CARPAL TUNNEL RELEASE     L wrist, ulnar nerve moved  . CHOLECYSTECTOMY    . CRANIOTOMY FOR TUMOR  2000  .  ELBOW SURGERY     left  . KNEE ARTHROSCOPY Left 10/12/2014   Procedure: LEFT KNEE ARTHROSCOPY ;  Surgeon: Gaynelle Arabian, MD;  Location: [facility];  Service: Orthopedics;  Laterality: Left;  . KYPHOPLASTY N/A 03/30/2016   Procedure: THORACIC 12 KYPHOPLASTY;  Surgeon: Phylliss Bob, MD;  Location: [facility];  Service: Orthopedics;  Laterality: N/A;  THORACIC 12 KYPHOPLASTY  . LEFT HEART CATHETERIZATION WITH CORONARY ANGIOGRAM N/A 12/16/2013   Procedure: LEFT HEART CATHETERIZATION WITH CORONARY ANGIOGRAM;  Surgeon: Peter M [name], MD;  Location: [facility] CATH LAB;  Service: Cardiovascular;  Laterality: N/A;  . LIPOMA EXCISION  03/28/2009   right leg  . MELANOMA EXCISION    . PORT-A-CATH REMOVAL  11/30/2010   Streck  . porta cath    . PORTACATH PLACEMENT  may 2011  .  REDUCTION MAMMAPLASTY Bilateral   . SYNOVECTOMY Left 10/12/2014   Procedure: WITH SYNOVECTOMY;  Surgeon: Gaynelle Arabian, MD;  Location: [facility];  Service: Orthopedics;  Laterality: Left;  . TONSILLECTOMY  1958  . TOTAL KNEE ARTHROPLASTY  02/05/2012   Procedure: TOTAL KNEE ARTHROPLASTY;  Surgeon: Gearlean Alf, MD;  Location: [facility];  Service: Orthopedics;  Laterality: Right;  . TOTAL KNEE ARTHROPLASTY Left 02/10/2013   Procedure: LEFT TOTAL KNEE ARTHROPLASTY;  Surgeon: Gearlean Alf, MD;  Location: [facility];  Service: Orthopedics;  Laterality: Left;  . total knee raplacement  01-2012   Right Knee  . TUBAL LIGATION  1997    There were no vitals filed for this visit.   Subjective Assessment - 01/03/18 1018    Subjective  Patient reports she has had LBP for the past 10 years, had kyphoplasty in 2018. Since the surgery, pain has been better but now having exacerbation of LBP for the last year. She has a son that had a stroke and has been lifting him a lot. Pain is in B LBP; denies radiation or N/T. Aggravating factors include transferring her disabled son, prolonged sitting, STS after prolonged sitting, prolonged positioning in bed. Easing factors include: hot water bottle and heat. Reports that she is 8 years out in remission from breast CA. Reports that she has fallen down basement stairs and broke the bone around her eye. Says that she is always in a rush. Has had 2-3 falls in the last year and all were cause by being in a hurry.    Pertinent History  osteoporotic vertebral fx 2018, T12 kyphoplasty, recurrent falls, prediabetes, nonischemic cardiomyopathy, neuropathy, melanoma, HTN, HLD, breast CA with chemo and radiation, GERD, facial fx, L carpal tunnel release, brain tumor with craniotomy, asthma, anemia, B TKA, L heart cath 2015    Limitations  Sitting;Lifting;Standing;Walking;House hold activities    How long can you sit comfortably?  30 min    How long can you stand comfortably?  30 min    How  long can you walk comfortably?  15 min    Diagnostic tests  per patient- MD did xray but not sure of results    Patient Stated Goals  "that my back feels better"    Currently in Pain?  Yes    Pain Score  4     Pain Location  Back    Pain Orientation  Right;Left;Lower    Pain Descriptors / Indicators  Dull    Pain Type  Chronic pain         [facility] PT Assessment - 01/03/18 1032      Assessment   Medical Diagnosis  Fall,  Debility, L sided back pain, L knee pain    Referring Provider (PT)  Penni Homans, MD    Onset Date/Surgical Date  01/03/17    Next MD Visit  02/04/17    Prior Therapy  Yes      Precautions   Precautions  Fall   hx breast CA and melanoma, osteoporotic fx     Restrictions   Weight Bearing Restrictions  No      Balance Screen   Has the patient fallen in the past 6 months  Yes    How many times?  2    Has the patient had a decrease in activity level because of a fear of falling?   No    Is the patient reluctant to leave their home because of a fear of falling?   No      Home Environment   Living Environment  Private residence    Living Arrangements  Children;Spouse/significant other    Available Help at Discharge  Family   is caregiver for disabled son   Type of [place] to enter;Ramped entrance    Entrance Stairs-Number of Steps  2    Entrance Stairs-Rails  Right    Home Layout  Multi-level    Alternate Level Stairs-Number of Steps  14    Alternate Level Stairs-Rails  Right    Home Equipment  None      Prior Function   Level of Independence  Independent    Vocation  Retired    Leisure  caring for son      Cognition   Overall Cognitive Status  Within Functional Limits for tasks assessed      Sensation   Light Touch  Appears Intact   B feet neuropathy tingling     Coordination   Gross Motor Movements are Fluid and Coordinated  Yes      Posture/Postural Control   Posture/Postural Control  Postural limitations     Postural Limitations  Rounded Shoulders;Forward head;Weight shift left    Posture Comments  large kyphosis in thoracic spine      ROM / Strength   AROM / PROM / Strength  AROM;Strength      AROM   AROM Assessment Site  Lumbar    Lumbar Flexion  mid shin   5/10 pain on return   Lumbar Extension  mildly limited    Lumbar - Right Side Bend  distal thigh    Lumbar - Left Side Bend  distal thigh    Lumbar - Right Rotation  mildly limited    Lumbar - Left Rotation  mildly limited   6/10 pain in LB      Strength   Strength Assessment Site  Hip;Knee;Ankle    Right/Left Hip  Right;Left    Right Hip Flexion  4+/5    Right Hip ABduction  4+/5    Right Hip ADduction  4+/5    Left Hip Flexion  4/5    Left Hip ABduction  4/5    Left Hip ADduction  4/5    Right/Left Knee  Right;Left    Right Knee Flexion  5/5    Right Knee Extension  5/5    Left Knee Flexion  5/5    Left Knee Extension  5/5    Right/Left Ankle  Right;Left    Right Ankle Dorsiflexion  4-/5   pain over dorsum of foot   Right Ankle Plantar Flexion  4/5  Left Ankle Dorsiflexion  4+/5    Left Ankle Plantar Flexion  4/5   pain in L plantar surface     Flexibility   Soft Tissue Assessment /Muscle Length  yes    Quadriceps  WFL B    Piriformis  mild tightness B      Palpation   Palpation comment  TTP over lower thoracic and lumbar spinous processes; TTP L lumbar paraspinals and QL, TTP over B glutes and L piriformis      Ambulation/Gait   Assistive device  None    Gait Pattern  Step-through pattern;Decreased step length - right;Decreased step length - left;Decreased weight shift to left;Trunk flexed;Lateral trunk lean to left    Ambulation Surface  Level;Indoor    Gait velocity  decreased                Objective measurements completed on examination: See above findings.              PT Education - 01/03/18 1201    Education Details  prognosis, POC, HEP    Person(s) Educated  Patient     Methods  Explanation;Demonstration;Tactile cues;Verbal cues;Handout    Comprehension  Verbalized understanding;Returned demonstration       PT Short Term Goals - 01/03/18 1216      PT SHORT TERM GOAL #1   Title  Patient to be independent with initial HEP.    Time  3    Period  Weeks    Status  New    Target Date  01/24/18        PT Long Term Goals - 01/03/18 1217      PT LONG TERM GOAL #1   Title  Patient to be independent with advanced HEP.    Time  6    Period  Weeks    Status  New    Target Date  02/14/18      PT LONG TERM GOAL #2   Title  Patient to demonstrate >=4+/5 strength in B LEs.    Time  6    Period  Weeks    Status  New    Target Date  02/14/18      PT LONG TERM GOAL #3   Title  Patient to demonstrate proper body mechanics and report no pain when lifting 20# box.    Time  6    Period  Weeks    Status  New    Target Date  02/14/18      PT LONG TERM GOAL #4   Title  Patient to report 70% improvement in pain levels when transfering from STS and supine to sit.     Time  6    Period  Weeks    Status  New    Target Date  02/14/18      PT LONG TERM GOAL #5   Title  Patient to score >19/24 on DGI to decrease risk of falls.     Time  6    Period  Weeks    Status  New    Target Date  02/14/18             Plan - 01/03/18 1202    Clinical Impression Statement  Patient is a 72y/o F presenting to OPPT with c/o acute on chronic exacerbation of LBP. PMH significant for T12 kyphoplasty in 2018 and breast CA in remission. Pain is in B LBP; denies radiation or N/T. Aggravating factors include transferring her disabled son,  prolonged sitting, STS after prolonged sitting, prolonged positioning in bed. Also reports 2-3 falls in the last year secondary to "being in a hurt." Patient today with limited and painful lumbar AROM, decreased LE strength, decreased flexibility, tenderness to palpation along thoracolumbar spine, glutes, piriformis, abnormal posture, and  gait deviations. Patient also with significant pain and difficulty with bed mobility during assessment today. Educated on gentle ROM and stretching HEP and advised not to push into pain. Patient reported understanding. Would benefit from skilled PT services 2x/week for 6 weeks to address aforementioned impairments.     Clinical Presentation  Stable    Clinical Decision Making  Low    Rehab Potential  Good    PT Frequency  2x / week    PT Duration  6 weeks    PT Treatment/Interventions  ADLs/Self Care Home Management;Cryotherapy;Electrical Stimulation;Moist Heat;Ultrasound;DME Instruction;Gait training;Stair training;Functional mobility training;Therapeutic activities;Therapeutic exercise;Manual techniques;Patient/family education;Neuromuscular re-education;Balance training;Passive range of motion;Dry needling;Energy conservation;Splinting;Taping    PT Next Visit Plan  reassess HEP; DGI    Consulted and Agree with Plan of Care  Patient       Patient will benefit from skilled therapeutic intervention in order to improve the following deficits and impairments:  Decreased endurance, Decreased activity tolerance, Decreased strength, Pain  Visit Diagnosis: Chronic bilateral low back pain without sciatica  Muscle weakness (generalized)  Abnormal posture  Difficulty in walking, not elsewhere classified     Problem List Patient Active Problem List   Diagnosis Date Noted  . Other headache syndrome 12/23/2017  . Debility 12/23/2017  . Left knee pain 12/23/2017  . Closed fracture of maxilla, initial encounter ([place]) 10/17/2017  . Vitamin D deficiency 06/14/2017  . Vertebral fracture, osteoporotic, sequela 04/05/2016  . Left-sided back pain 03/06/2016  . Chest pain 02/27/2016  . Syncope 02/27/2016  . Cataracts, bilateral 12/28/2015  . Recurrent falls 12/28/2015  . Genetic testing 11/29/2015  . Family history- stomach cancer 09/08/2015  . Family history of cancer 09/08/2015  . [name] lesion 06/21/2015  . Left leg swelling 05/24/2015  . Medicare annual wellness visit, subsequent 12/27/2014  . 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 Obesity 09/28/2014  . Interstitial cystitis 09/28/2014  . Pain in joint, ankle and foot 09/28/2014  . Abnormal nuclear stress test 12/16/2013  . Sternal pain 11/06/2013  . Postoperative anemia due to acute blood loss 02/13/2013  . Preop cardiovascular exam 12/30/2012  . 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 Breast cancer of lower-inner quadrant of right female breast ([place]) 10/21/2012  . Insomnia 10/18/2012  . UTI (urinary tract infection) 03/03/2012  . Constipation 03/03/2012  . OA (osteoarthritis) 09/07/2011  . Dermatitis   . Hyponatremia 08/28/2011  . Osteoporosis 03/07/2011  . IC (interstitial cystitis)   . Melanoma ([place])   . Craniopharyngioma ([place]) 03/25/2010  . Prediabetes 06/15/2009  . LIPOMA 02/10/2009  . Dyspnea on exertion 02/10/2009  . Hyperlipidemia, mixed 08/11/2008  . Depression with anxiety 08/11/2008  . Essential hypertension 08/11/2008    Janene Harvey, PT, DPT 01/03/18 12:21 PM   [facility] [address]  [place], [place], [address] Phone: [phone]   Fax:  [phone]  Name: Shannon [name] MRN: [id number] Date of Birth: 12-Apr-1945

## 2018-01-09 ENCOUNTER — Ambulatory Visit: Payer: Medicare Other | Admitting: Psychology

## 2018-01-10 ENCOUNTER — Ambulatory Visit (HOSPITAL_BASED_OUTPATIENT_CLINIC_OR_DEPARTMENT_OTHER)
Admission: RE | Admit: 2018-01-10 | Discharge: 2018-01-10 | Disposition: A | Discharge status: Home / Self Care | Payer: Medicare Other | Source: Ambulatory Visit | Attending: Adult Health | Admitting: Adult Health

## 2018-01-10 ENCOUNTER — Ambulatory Visit: Payer: Medicare Other | Admitting: Rehabilitative and Restorative Service Providers"

## 2018-01-10 DIAGNOSIS — E2839 Other primary ovarian failure: Secondary | ICD-10-CM | POA: Diagnosis not present

## 2018-01-10 DIAGNOSIS — M8589 Other specified disorders of bone density and structure, multiple sites: Secondary | ICD-10-CM | POA: Diagnosis not present

## 2018-01-10 DIAGNOSIS — Z780 Asymptomatic menopausal state: Secondary | ICD-10-CM | POA: Diagnosis not present

## 2018-01-14 ENCOUNTER — Ambulatory Visit: Payer: Medicare Other | Admitting: Physical Therapy

## 2018-01-14 ENCOUNTER — Encounter: Payer: Self-pay | Admitting: Physical Therapy

## 2018-01-14 ENCOUNTER — Other Ambulatory Visit (INDEPENDENT_AMBULATORY_CARE_PROVIDER_SITE_OTHER): Payer: Medicare Other

## 2018-01-14 DIAGNOSIS — M545 Low back pain, unspecified: Secondary | ICD-10-CM

## 2018-01-14 DIAGNOSIS — M6281 Muscle weakness (generalized): Secondary | ICD-10-CM

## 2018-01-14 DIAGNOSIS — R410 Disorientation, unspecified: Secondary | ICD-10-CM | POA: Diagnosis not present

## 2018-01-14 DIAGNOSIS — R262 Difficulty in walking, not elsewhere classified: Secondary | ICD-10-CM

## 2018-01-14 DIAGNOSIS — G8929 Other chronic pain: Secondary | ICD-10-CM

## 2018-01-14 DIAGNOSIS — R350 Frequency of micturition: Secondary | ICD-10-CM

## 2018-01-14 DIAGNOSIS — R293 Abnormal posture: Secondary | ICD-10-CM | POA: Diagnosis not present

## 2018-01-14 LAB — URINALYSIS, ROUTINE W REFLEX MICROSCOPIC
Bilirubin Urine: NEGATIVE
Hgb urine dipstick: NEGATIVE
KETONES UR: NEGATIVE
Nitrite: NEGATIVE
PH: 6 (ref 5.0–8.0)
Specific Gravity, Urine: <=1.005 — AB (ref 1.000–1.030)
Total Protein, Urine: NEGATIVE
Urine Glucose: NEGATIVE
Urobilinogen, UA: 0.2 (ref 0.0–1.0)

## 2018-01-14 NOTE — Therapy (Signed)
[facility] [place] [address]  [address] [place], [place], [address] Phone: [phone]   Fax:  [phone]  Physical Therapy Treatment  Patient Details  Name: Shannon [name] MRN: [id number] Date of Birth: 01/05/1946 Referring Provider (PT): Stacey Blyth, MD   Encounter Date: 01/14/2018  PT End of Session - 01/14/18 1052    Visit Number  2    Number of Visits  13    Date for PT Re-Evaluation  02/14/18    Authorization Type  Medicare & BCBS    PT Start Time  1010    PT Stop Time  1052    PT Time Calculation (min)  42 min    Equipment Utilized During Treatment  Gait belt    Activity Tolerance  Patient tolerated treatment well;Patient limited by pain    Behavior During Therapy  WFL for tasks assessed/performed       Past Medical History:  Diagnosis Date  . 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 Anemia 08/28/2011  . Asthma    as a child  . Brain tumor (HCC)   . Breast cancer (HCC)    right breast  . Carpal tunnel syndrome of left wrist   . Cataracts, bilateral 12/28/2015  . Constipation 03/03/2012  . DEPRESSION 08/11/2008  . Dermatitis   . Facial fracture (HCC)   . GERD 08/11/2008  . H/O measles   . H/O mumps   . History of blood transfusion   . History of chicken pox   . History of hiatal hernia   . 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 HYPERLIPIDEMIA 08/11/2008  . HYPERTENSION 08/11/2008  . Hyponatremia 08/28/2011  . 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 Interstitial cystitis   . LIPOMA 02/10/2009  . Medicare annual wellness visit, subsequent 12/27/2014   Follows with Dr Evans of urology Follows with Dr Patel at [facility] for opthamology Follows with Dr Jordan of dermatology Follows with [facility] gastroenterology, last scope done in 2011 repeat in 10 years Last MGM July 2015 should repeat every 1-2 years Last Pap December 2015, repeat in 3 years.    . Melanoma (HCC) 2008   DR. JONES  . Neuropathy   . NICM (nonischemic cardiomyopathy) (HCC) 03/08/2010   likely 2/2 chemotx - a. Echo 2012: EF 45-50%;  b. Lex MV 2/12:  low risk, apical defect (small area of ischemia vs shifting breast atten);  c.  Echo 7/12: Normal wall thickness, EF 60-65%, normal wall motion, grade 1 diastolic dysfunction, mild LAE, PASP 32;   d. Lex MV 11/13:  EF 76%, no ischemia  . OA (osteoarthritis) 09/07/2011  . Obesity 09/28/2014  . Osteoporosis   . Personal history of chemotherapy 2012  . Personal history of radiation therapy 2012  . PREDIABETES 06/15/2009  . Recurrent falls 12/28/2015  . Shortness of breath   . UTI (lower urinary tract infection) 03/03/2012  . Vertebral fracture, osteoporotic, sequela 04/05/2016    Past Surgical History:  Procedure Laterality Date  . BREAST LUMPECTOMY  04/2009   RIGHT FOR BREAST CANCER-CHEMO/RADIATION X 1 YEAR  . BREAST REDUCTION SURGERY Bilateral 05/04/2014   Procedure: MAMMARY REDUCTION  (BREAST);  Surgeon: Gerald Truesdale, MD;  Location: [facility];  Service: Plastics;  Laterality: Bilateral;  . CARPAL TUNNEL RELEASE     L wrist, ulnar nerve moved  . CHOLECYSTECTOMY    .   CRANIOTOMY FOR TUMOR  2000  . ELBOW SURGERY     left  . KNEE ARTHROSCOPY Left 10/12/2014   Procedure: LEFT KNEE ARTHROSCOPY ;  Surgeon: Frank Aluisio, MD;  Location: [facility];  Service: Orthopedics;  Laterality: Left;  . KYPHOPLASTY N/A 03/30/2016   Procedure: THORACIC 12 KYPHOPLASTY;  Surgeon: Mark Dumonski, MD;  Location: [facility];  Service: Orthopedics;  Laterality: N/A;  THORACIC 12 KYPHOPLASTY  . LEFT HEART CATHETERIZATION WITH CORONARY ANGIOGRAM N/A 12/16/2013   Procedure: LEFT HEART CATHETERIZATION WITH CORONARY ANGIOGRAM;  Surgeon: Peter M Jordan, MD;  Location: [facility];  Service: Cardiovascular;  Laterality: N/A;  . LIPOMA EXCISION  03/28/2009   right leg  . MELANOMA EXCISION    . PORT-A-CATH REMOVAL  11/30/2010   Streck  . porta cath     . PORTACATH PLACEMENT  may 2011  . REDUCTION MAMMAPLASTY Bilateral   . SYNOVECTOMY Left 10/12/2014   Procedure: WITH SYNOVECTOMY;  Surgeon: Frank Aluisio, MD;  Location: [facility];  Service: Orthopedics;  Laterality: Left;  . TONSILLECTOMY  1958  . TOTAL KNEE ARTHROPLASTY  02/05/2012   Procedure: TOTAL KNEE ARTHROPLASTY;  Surgeon: Frank V Aluisio, MD;  Location: [facility];  Service: Orthopedics;  Laterality: Right;  . TOTAL KNEE ARTHROPLASTY Left 02/10/2013   Procedure: LEFT TOTAL KNEE ARTHROPLASTY;  Surgeon: Frank V Aluisio, MD;  Location: [facility];  Service: Orthopedics;  Laterality: Left;  . total knee raplacement  01-2012   Right Knee  . TUBAL LIGATION  1997    There were no vitals filed for this visit.  Subjective Assessment - 01/14/18 1012    Subjective  Reports that she has been feeling pretty good but has not been able to be as consistent with HEP because she is caring for her disabled son. Denies falls.     Pertinent History  osteoporotic vertebral fx 2018, T12 kyphoplasty, recurrent falls, prediabetes, nonischemic cardiomyopathy, neuropathy, melanoma, HTN, HLD, breast CA with chemo and radiation, GERD, facial fx, L carpal tunnel release, brain tumor with craniotomy, asthma, anemia, B TKA, L heart cath 2015    Diagnostic tests  per patient- MD did xray but not sure of results    Patient Stated Goals  "that my back feels better"    Currently in Pain?  Yes    Pain Score  5     Pain Location  Back    Pain Orientation  Right;Left;Lower    Pain Descriptors / Indicators  Dull    Pain Type  Chronic pain         [facility] PT Assessment - 01/14/18 0001      Standardized Balance Assessment   Standardized Balance Assessment  Dynamic Gait Index      Dynamic Gait Index   Level Surface  Normal    Change in Gait Speed  Normal    Gait with Horizontal Head Turns  Mild Impairment    Gait with Vertical Head Turns  Normal    Gait and Pivot Turn  Normal    Step Over Obstacle  Mild Impairment     Step Around Obstacles  Mild Impairment    Steps  Normal    Total Score  21                   [facility] Adult PT Treatment/Exercise - 01/14/18 0001      Exercises   Exercises  Knee/Hip;Lumbar      Lumbar Exercises: Stretches   Single Knee to Chest Stretch    Right;Left;1 rep;30 seconds   to tolerance   Single Knee to Chest Stretch Limitations  cues to clasp hands behind thigh to avoid knee pain    Figure 4 Stretch  1 rep;30 seconds;With overpressure    Figure 4 Stretch Limitations  to tolerance; manual assistance for set up      Lumbar Exercises: Aerobic   Nustep  L3 x 13mn LEs only      Lumbar Exercises: Seated   Sit to Stand  10 reps   no UE support; cues to avoid "plopping"   Other Seated Lumbar Exercises  scapular retraction 10x3"   manual cues and guidance     Lumbar Exercises: Supine   Bridge  10 reps    Bridge Limitations  cues to stay within pain-free range    Other Supine Lumbar Exercises  LTR 10x3"   pt reporting more stretch on L     Lumbar Exercises: Sidelying   Other Sidelying Lumbar Exercises  open book stretch x10 each side   heavy correction of form and cues to slow down            PT Education - 01/14/18 1055    Education Details  discussion on importance of HEP compliance    Person(s) Educated  Patient    Methods  Explanation;Demonstration;Tactile cues;Verbal cues    Comprehension  Verbalized understanding       PT Short Term Goals - 01/14/18 1053      PT SHORT TERM GOAL #1   Title  Patient to be independent with initial HEP.    Time  3    Period  Weeks    Status  On-going        PT Long Term Goals - 01/14/18 1054      PT LONG TERM GOAL #1   Title  Patient to be independent with advanced HEP.    Time  6    Period  Weeks    Status  On-going      PT LONG TERM GOAL #2   Title  Patient to demonstrate >=4+/5 strength in B LEs.    Time  6    Period  Weeks    Status  On-going      PT LONG TERM GOAL #3   Title  Patient to  demonstrate proper body mechanics and report no pain when lifting 20# box.    Time  6    Period  Weeks    Status  On-going      PT LONG TERM GOAL #4   Title  Patient to report 70% improvement in pain levels when transfering from STS and supine to sit.     Time  6    Period  Weeks    Status  On-going      PT LONG TERM GOAL #5   Title  Patient to score >19/24 on DGI to decrease risk of falls.     Time  6    Period  Weeks    Status  Achieved            Plan - 01/14/18 1053    Clinical Impression Statement  Patient arrived to session reporting intermittent compliance with HEP d/t having to take care of disabled son. Reports LBP has been manageable. Patient scored 21/24 on DGI, indicating low risk of falls. Patient was able to ascend/descend steps with reciprocal pattern and 1 handrail, however with considerable anterior trunk lean when ascending and with poor eccentric lower when descending.  Plan on addressing these deviations to improve comfort with stair navigation. Worked on lumbopelvic ROM and core strengthening ther-ex this session with intermittent cues for correction of form and manual assistance for set up of exercise. Patient with poor carryover of HEP today- reviewed it with patient demonstrating proper form by end of session. Patient demonstrated good control of sit>stand, decreased control of eccentric lowering with good effort to correct. Ended session with patient reporting less stiffness in [facility].     PT Treatment/Interventions  ADLs/Self Care Home Management;Cryotherapy;Electrical Stimulation;Moist Heat;Ultrasound;DME Instruction;Gait training;Stair training;Functional mobility training;Therapeutic activities;Therapeutic exercise;Manual techniques;Patient/family education;Neuromuscular re-education;Balance training;Passive range of motion;Dry needling;Energy conservation;Splinting;Taping    PT Next Visit Plan  edu on lifting mechanics, ant/lat step ups    Consulted and Agree with  Plan of Care  Patient       Patient will benefit from skilled therapeutic intervention in order to improve the following deficits and impairments:  Decreased endurance, Decreased activity tolerance, Decreased strength, Pain  Visit Diagnosis: Chronic bilateral low back pain without sciatica  Muscle weakness (generalized)  Abnormal posture  Difficulty in walking, not elsewhere classified     Problem List Patient Active Problem List   Diagnosis Date Noted  . Other headache syndrome 12/23/2017  . Debility 12/23/2017  . Left knee pain 12/23/2017  . Closed fracture of maxilla, initial encounter ([place]) 10/17/2017  . Vitamin D deficiency 06/14/2017  . Vertebral fracture, osteoporotic, sequela 04/05/2016  . Left-sided back pain 03/06/2016  . Chest pain 02/27/2016  . Syncope 02/27/2016  . Cataracts, bilateral 12/28/2015  . Recurrent falls 12/28/2015  . Genetic testing 11/29/2015  . Family history- stomach cancer 09/08/2015  . Family history of cancer 09/08/2015  . [name] lesion 06/21/2015  . Left leg swelling 05/24/2015  . Medicare annual wellness visit, subsequent 12/27/2014  . 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 Obesity 09/28/2014  . Interstitial cystitis 09/28/2014  . Pain in joint, ankle and foot 09/28/2014  . Abnormal nuclear stress test 12/16/2013  . Sternal pain 11/06/2013  . Postoperative anemia due to acute blood loss 02/13/2013  . Preop cardiovascular exam 12/30/2012  . 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 Breast cancer of lower-inner quadrant of right female breast ([place]) 10/21/2012  . Insomnia 10/18/2012  . UTI (urinary tract infection) 03/03/2012  . Constipation 03/03/2012  . OA (osteoarthritis) 09/07/2011  . Dermatitis   . Hyponatremia 08/28/2011  . Osteoporosis 03/07/2011  . IC (interstitial cystitis)   . Melanoma ([place])   . Craniopharyngioma ([place]) 03/25/2010  . Prediabetes 06/15/2009  . LIPOMA  02/10/2009  . Dyspnea on exertion 02/10/2009  . Hyperlipidemia, mixed 08/11/2008  . Depression with anxiety 08/11/2008  . Essential hypertension 08/11/2008    Janene Harvey, PT, DPT 01/14/18 10:56 AM   [facility] [address]  [place], [place], [address] Phone: [phone]   Fax:  [phone]  Name: Shannon [name] MRN: [id number] Date of Birth: 09-Jun-1945

## 2018-01-16 ENCOUNTER — Other Ambulatory Visit: Payer: Self-pay | Admitting: Family Medicine

## 2018-01-16 DIAGNOSIS — R82998 Other abnormal findings in urine: Secondary | ICD-10-CM

## 2018-01-17 ENCOUNTER — Ambulatory Visit: Payer: Medicare Other

## 2018-01-17 DIAGNOSIS — M6281 Muscle weakness (generalized): Secondary | ICD-10-CM | POA: Diagnosis not present

## 2018-01-17 DIAGNOSIS — R262 Difficulty in walking, not elsewhere classified: Secondary | ICD-10-CM | POA: Diagnosis not present

## 2018-01-17 DIAGNOSIS — M545 Low back pain: Principal | ICD-10-CM

## 2018-01-17 DIAGNOSIS — R293 Abnormal posture: Secondary | ICD-10-CM | POA: Diagnosis not present

## 2018-01-17 DIAGNOSIS — G8929 Other chronic pain: Secondary | ICD-10-CM | POA: Diagnosis not present

## 2018-01-17 NOTE — Therapy (Signed)
[facility] [place] [address]  [place], [place], [address] Phone: [phone]   Fax:  [phone]  Physical Therapy Treatment  Patient Details  Name: Shannon [name] MRN: [id number] Date of Birth: [birth year] Referring Provider (PT): Penni Homans, MD   Encounter Date: 01/17/2018  PT End of Session - 01/17/18 1029    Visit Number  3    Number of Visits  13    Date for PT Re-Evaluation  02/14/18    Authorization Type  Medicare & BCBS    PT Start Time  1025    PT Stop Time  1118    PT Time Calculation (min)  53 min    Equipment Utilized During Treatment  Gait belt    Activity Tolerance  Patient tolerated treatment well;Patient limited by pain    Behavior During Therapy  WFL for tasks assessed/performed       Past Medical History:  Diagnosis Date  . 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 Anemia 08/28/2011  . Asthma    as a child  . Brain tumor ([place])   . Breast cancer ([place])    right breast  . Carpal tunnel syndrome of left wrist   . Cataracts, bilateral 12/28/2015  . Constipation 03/03/2012  . DEPRESSION 08/11/2008  . Dermatitis   . Facial fracture ([place])   . GERD 08/11/2008  . H/O measles   . H/O mumps   . History of blood transfusion   . History of chicken pox   . History of hiatal hernia   . 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 HYPERLIPIDEMIA 08/11/2008  . HYPERTENSION 08/11/2008  . Hyponatremia 08/28/2011  . 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 Interstitial cystitis   . LIPOMA 02/10/2009  . Medicare annual wellness visit, subsequent 12/27/2014   Follows with Dr Amalia Hailey of urology Follows with Dr Posey Pronto at [facility] for opthamology Follows with Dr [name] of dermatology Follows with [facility]ology, last scope done in 2011 repeat in 10 years Last [facility] July 2015 should repeat every 1-2 years Last Pap December 2015, repeat in 3 years.    . Melanoma ([place]) 2008   DR. Ronnald Ramp  . Neuropathy   . NICM (nonischemic cardiomyopathy) ([place]) 03/08/2010   likely 2/2 chemotx - a. Echo 2012: EF 45-50%;  b. Lex MV 2/12:  low risk, apical defect (small area of ischemia vs shifting breast atten);  c.  Echo 7/12: Normal wall thickness, EF 60-65%, normal wall motion, grade 1 diastolic dysfunction, mild LAE, PASP 32;   d. Lex MV 11/13:  EF 76%, no ischemia  . OA (osteoarthritis) 09/07/2011  . Obesity 09/28/2014  . Osteoporosis   . Personal history of chemotherapy 2012  . Personal history of radiation therapy 2012  . PREDIABETES 06/15/2009  . Recurrent falls 12/28/2015  . Shortness of breath   . UTI (lower urinary tract infection) 03/03/2012  . Vertebral fracture, osteoporotic, sequela 04/05/2016    Past Surgical History:  Procedure Laterality Date  . BREAST LUMPECTOMY  04/2009   RIGHT FOR BREAST CANCER-CHEMO/RADIATION X 1 YEAR  . BREAST REDUCTION SURGERY Bilateral 05/04/2014   Procedure: MAMMARY REDUCTION  (BREAST);  Surgeon: Cristine Polio, MD;  Location: [facility];  Service: Plastics;  Laterality: Bilateral;  . CARPAL TUNNEL RELEASE     L wrist, ulnar nerve moved  . CHOLECYSTECTOMY    .  CRANIOTOMY FOR TUMOR  2000  . ELBOW SURGERY     left  . KNEE ARTHROSCOPY Left 10/12/2014   Procedure: LEFT KNEE ARTHROSCOPY ;  Surgeon: Gaynelle Arabian, MD;  Location: [facility];  Service: Orthopedics;  Laterality: Left;  . KYPHOPLASTY N/A 03/30/2016   Procedure: THORACIC 12 KYPHOPLASTY;  Surgeon: Phylliss Bob, MD;  Location: [facility];  Service: Orthopedics;  Laterality: N/A;  THORACIC 12 KYPHOPLASTY  . LEFT HEART CATHETERIZATION WITH CORONARY ANGIOGRAM N/A 12/16/2013   Procedure: LEFT HEART CATHETERIZATION WITH CORONARY ANGIOGRAM;  Surgeon: Peter M [name], MD;  Location: [facility];  Service: Cardiovascular;  Laterality: N/A;  . LIPOMA EXCISION  03/28/2009   right leg  . MELANOMA EXCISION    . PORT-A-CATH REMOVAL  11/30/2010   Streck  . porta cath     . PORTACATH PLACEMENT  may 2011  . REDUCTION MAMMAPLASTY Bilateral   . SYNOVECTOMY Left 10/12/2014   Procedure: WITH SYNOVECTOMY;  Surgeon: Gaynelle Arabian, MD;  [facility];  Service: Orthopedics;  Laterality: Left;  . TONSILLECTOMY  1958  . TOTAL KNEE ARTHROPLASTY  02/05/2012   Procedure: TOTAL KNEE ARTHROPLASTY;  Surgeon: Gearlean Alf, MD;  [facility]: [facility];  Service: Orthopedics;  Laterality: Right;  . TOTAL KNEE ARTHROPLASTY Left 02/10/2013   Procedure: LEFT TOTAL KNEE ARTHROPLASTY;  Surgeon: Gearlean Alf, MD;  [facility]: [facility];  Service: Orthopedics;  Laterality: Left;  . total knee raplacement  01-2012   Right Knee  . TUBAL LIGATION  1997    There were no vitals filed for this visit.  Subjective Assessment - 01/17/18 1026    Subjective  Pt. reporting she felt better after last visit.      Pertinent History  osteoporotic vertebral fx 2018, T12 kyphoplasty, recurrent falls, prediabetes, nonischemic cardiomyopathy, neuropathy, melanoma, HTN, HLD, breast CA with chemo and radiation, GERD, facial fx, L carpal tunnel release, brain tumor with craniotomy, asthma, anemia, B TKA, L heart cath 2015    Diagnostic tests  per patient- MD did xray but not sure of results    Patient Stated Goals  "that my back feels better"    Currently in Pain?  Yes    Pain Score  5     Pain Location  Back    Pain Orientation  Right;Left;Lower    Pain Descriptors / Indicators  Dull    Pain Type  Chronic pain    Pain Frequency  Intermittent    Aggravating Factors   lifting, sweeping    Multiple Pain Sites  No                       [facility] Adult PT Treatment/Exercise - 01/17/18 1037      Lumbar Exercises: Stretches   Single Knee to Chest Stretch  Right;Left;1 rep;30 seconds    Lower Trunk Rotation  --   5" x 10 reps    Piriformis Stretch  Right;Left;1 rep;30 seconds    Piriformis Stretch Limitations  KTOS      Lumbar Exercises: Supine   Bridge  10 reps    Bridge Limitations   cues for buttocks contraction and reduction in LBP following this       Knee/Hip Exercises: Standing   Heel Raises  Both;15 reps    Heel Raises Limitations  heel/toe raise at counter     Forward Step Up  Right;Left;10 reps;Step Height: 6";Hand Hold: 1    Forward Step Up Limitations  at machine  Functional Squat  10 reps;3 seconds    Functional Squat Limitations  TRX    SLS  B SLS at counter 3 x 5"       Modalities   Modalities  Electrical Stimulation;Moist Heat      Moist Heat Therapy   Number Minutes Moist Heat  15 Minutes    Moist Heat Location  Lumbar Spine      Electrical Stimulation   Electrical Stimulation Location  lumbar spine     Electrical Stimulation Action  IFC    Electrical Stimulation Parameters  to tolerance, 15'    Electrical Stimulation Goals  Pain               PT Short Term Goals - 01/14/18 1053      PT SHORT TERM GOAL #1   Title  Patient to be independent with initial HEP.    Time  3    Period  Weeks    Status  On-going        PT Long Term Goals - 01/14/18 1054      PT LONG TERM GOAL #1   Title  Patient to be independent with advanced HEP.    Time  6    Period  Weeks    Status  On-going      PT LONG TERM GOAL #2   Title  Patient to demonstrate >=4+/5 strength in B LEs.    Time  6    Period  Weeks    Status  On-going      PT LONG TERM GOAL #3   Title  Patient to demonstrate proper body mechanics and report no pain when lifting 20# box.    Time  6    Period  Weeks    Status  On-going      PT LONG TERM GOAL #4   Title  Patient to report 70% improvement in pain levels when transfering from STS and supine to sit.     Time  6    Period  Weeks    Status  On-going      PT LONG TERM GOAL #5   Title  Patient to score >19/24 on DGI to decrease risk of falls.     Time  6    Period  Weeks    Status  Achieved            Plan - 01/17/18 1029    Clinical Impression Statement  Amiaya reporting HEP has been going well except for  the last three days as she has been busy taking care of her special needs son.  Tolerated all gentle lumbar ROM and stability activities in today's session well without increased pain.  Most difficulty with SLS balance at counter top today however tolerated all standing balance activities well. Will plan to update HEP in coming visits.  Ended visit with some low-level back pain thus trialed E-stim/moist heat to lumbar spine to end session.  Pt. verbalizing good relief from E-stim and will plan to provide handout for home TENS unit in coming visits.      Rehab Potential  Good    PT Frequency  2x / week    PT Duration  6 weeks    PT Treatment/Interventions  ADLs/Self Care Home Management;Cryotherapy;Electrical Stimulation;Moist Heat;Ultrasound;DME Instruction;Gait training;Stair training;Functional mobility training;Therapeutic activities;Therapeutic exercise;Manual techniques;Patient/family education;Neuromuscular re-education;Balance training;Passive range of motion;Dry needling;Energy conservation;Splinting;Taping    Consulted and Agree with Plan of Care  Patient       Patient  will benefit from skilled therapeutic intervention in order to improve the following deficits and impairments:  Decreased endurance, Decreased activity tolerance, Decreased strength, Pain  Visit Diagnosis: Chronic bilateral low back pain without sciatica  Muscle weakness (generalized)  Abnormal posture  Difficulty in walking, not elsewhere classified     Problem List Patient Active Problem List   Diagnosis Date Noted  . Other headache syndrome 12/23/2017  . Debility 12/23/2017  . Left knee pain 12/23/2017  . Closed fracture of maxilla, initial encounter ([place]) 10/17/2017  . Vitamin D deficiency 06/14/2017  . Vertebral fracture, osteoporotic, sequela 04/05/2016  . Left-sided back pain 03/06/2016  . Chest pain 02/27/2016  . Syncope 02/27/2016  . Cataracts, bilateral 12/28/2015  . Recurrent falls 12/28/2015  .  Genetic testing 11/29/2015  . Family history- stomach cancer 09/08/2015  . Family history of cancer 09/08/2015  . [name] lesion 06/21/2015  . Left leg swelling 05/24/2015  . Medicare annual wellness visit, subsequent 12/27/2014  . 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 Obesity 09/28/2014  . Interstitial cystitis 09/28/2014  . Pain in joint, ankle and foot 09/28/2014  . Abnormal nuclear stress test 12/16/2013  . Sternal pain 11/06/2013  . Postoperative anemia due to acute blood loss 02/13/2013  . Preop cardiovascular exam 12/30/2012  . 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 Breast cancer of lower-inner quadrant of right female breast ([place]) 10/21/2012  . Insomnia 10/18/2012  . UTI (urinary tract infection) 03/03/2012  . Constipation 03/03/2012  . OA (osteoarthritis) 09/07/2011  . Dermatitis   . Hyponatremia 08/28/2011  . Osteoporosis 03/07/2011  . IC (interstitial cystitis)   . Melanoma ([place])   . 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 Prediabetes 06/15/2009  . LIPOMA 02/10/2009  . Dyspnea on exertion 02/10/2009  . Hyperlipidemia, mixed 08/11/2008  . Depression with anxiety 08/11/2008  . Essential hypertension 08/11/2008    Bess Harvest, PTA 01/17/18 12:10 PM    [facility] [place] [address]  [place], [place], [address] Phone: [phone]   Fax:  [phone]  Name: PAULENA SERVAIS MRN: [id number] Date of Birth: [birth year]

## 2018-01-22 ENCOUNTER — Ambulatory Visit: Payer: Medicare Other | Admitting: Physical Therapy

## 2018-01-22 ENCOUNTER — Encounter: Payer: Self-pay | Admitting: Physical Therapy

## 2018-01-22 DIAGNOSIS — M545 Low back pain, unspecified: Secondary | ICD-10-CM

## 2018-01-22 DIAGNOSIS — M6281 Muscle weakness (generalized): Secondary | ICD-10-CM | POA: Diagnosis not present

## 2018-01-22 DIAGNOSIS — R262 Difficulty in walking, not elsewhere classified: Secondary | ICD-10-CM | POA: Diagnosis not present

## 2018-01-22 DIAGNOSIS — G8929 Other chronic pain: Secondary | ICD-10-CM

## 2018-01-22 DIAGNOSIS — R293 Abnormal posture: Secondary | ICD-10-CM | POA: Diagnosis not present

## 2018-01-22 NOTE — Therapy (Signed)
[facility] [address]  [place], [place], [address] Phone: [phone]   Fax:  [phone]  Physical Therapy Treatment  Patient Details  Name: Shannon [name] MRN: [id number] Date of Birth: January 01, 1946 Referring Provider (PT): Penni Homans, MD   Encounter Date: 01/22/2018  PT End of Session - 01/22/18 1212    Visit Number  4    Number of Visits  13    Date for PT Re-Evaluation  02/14/18    Authorization Type  Medicare & BCBS    PT Start Time  6606    PT Stop Time  1059    PT Time Calculation (min)  44 min    Activity Tolerance  Patient tolerated treatment well    Behavior During Therapy  [facility] for tasks assessed/performed       Past Medical History:  Diagnosis Date  . 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 Anemia 08/28/2011  . Asthma    as a child  . Brain tumor ([place])   . Breast cancer ([place])    right breast  . Carpal tunnel syndrome of left wrist   . Cataracts, bilateral 12/28/2015  . Constipation 03/03/2012  . DEPRESSION 08/11/2008  . Dermatitis   . Facial fracture ([place])   . GERD 08/11/2008  . H/O measles   . H/O mumps   . History of blood transfusion   . History of chicken pox   . History of hiatal hernia   . 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 HYPERLIPIDEMIA 08/11/2008  . HYPERTENSION 08/11/2008  . Hyponatremia 08/28/2011  . 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 Interstitial cystitis   . LIPOMA 02/10/2009  . Medicare annual wellness visit, subsequent 12/27/2014   Follows with Dr Amalia Hailey of urology Follows with Dr Posey Pronto at [facility] for opthamology Follows with Dr [name] of dermatology Follows with [facility] gastroenterology, last scope done in 2011 repeat in 10 years Last [facility] July 2015 should repeat every 1-2 years Last Pap December 2015, repeat in 3 years.   . Melanoma ([place]) 2008   DR. Ronnald Ramp  . Neuropathy   . NICM  (nonischemic cardiomyopathy) ([place]) 03/08/2010   likely 2/2 chemotx - a. Echo 2012: EF 45-50%;  b. Lex MV 2/12:  low risk, apical defect (small area of ischemia vs shifting breast atten);  c.  Echo 7/12: Normal wall thickness, EF 60-65%, normal wall motion, grade 1 diastolic dysfunction, mild LAE, PASP 32;   d. Lex MV 11/13:  EF 76%, no ischemia  . OA (osteoarthritis) 09/07/2011  . Obesity 09/28/2014  . Osteoporosis   . Personal history of chemotherapy 2012  . Personal history of radiation therapy 2012  . PREDIABETES 06/15/2009  . Recurrent falls 12/28/2015  . Shortness of breath   . UTI (lower urinary tract infection) 03/03/2012  . Vertebral fracture, osteoporotic, sequela 04/05/2016    Past Surgical History:  Procedure Laterality Date  . BREAST LUMPECTOMY  04/2009   RIGHT FOR BREAST CANCER-CHEMO/RADIATION X 1 YEAR  . BREAST REDUCTION SURGERY Bilateral 05/04/2014   Procedure: MAMMARY REDUCTION  (BREAST);  Surgeon: Cristine Polio, MD;  Location: [place];  Service: Plastics;  Laterality: Bilateral;  . CARPAL TUNNEL RELEASE     L wrist, ulnar nerve moved  . CHOLECYSTECTOMY    . CRANIOTOMY FOR TUMOR  2000  . ELBOW SURGERY  left  . KNEE ARTHROSCOPY Left 10/12/2014   Procedure: LEFT KNEE ARTHROSCOPY ;  Surgeon: Gaynelle Arabian, MD;  [facility];  Service: Orthopedics;  Laterality: Left;  . KYPHOPLASTY N/A 03/30/2016   Procedure: THORACIC 12 KYPHOPLASTY;  Surgeon: Phylliss Bob, MD;  Location: [facility];  Service: Orthopedics;  Laterality: N/A;  THORACIC 12 KYPHOPLASTY  . LEFT HEART CATHETERIZATION WITH CORONARY ANGIOGRAM N/A 12/16/2013   Procedure: LEFT HEART CATHETERIZATION WITH CORONARY ANGIOGRAM;  Surgeon: Peter M [name], MD;  Location: [facility];  Service: Cardiovascular;  Laterality: N/A;  . LIPOMA EXCISION  03/28/2009   right leg  . MELANOMA EXCISION    . PORT-A-CATH REMOVAL  11/30/2010   Streck  . porta cath    . PORTACATH PLACEMENT  may 2011  . REDUCTION MAMMAPLASTY  Bilateral   . SYNOVECTOMY Left 10/12/2014   Procedure: WITH SYNOVECTOMY;  Surgeon: Gaynelle Arabian, MD;  [facility];  Service: Orthopedics;  Laterality: Left;  . TONSILLECTOMY  1958  . TOTAL KNEE ARTHROPLASTY  02/05/2012   Procedure: TOTAL KNEE ARTHROPLASTY;  Surgeon: Gearlean Alf, MD;  [facility];  Service: Orthopedics;  Laterality: Right;  . TOTAL KNEE ARTHROPLASTY Left 02/10/2013   Procedure: LEFT TOTAL KNEE ARTHROPLASTY;  Surgeon: Gearlean Alf, MD;  [facility];  Service: Orthopedics;  Laterality: Left;  . total knee raplacement  01-2012   Right Knee  . TUBAL LIGATION  1997    There were no vitals filed for this visit.  Subjective Assessment - 01/22/18 1017    Subjective  Reports that she thinks her back is feeling better. Denies falls or trips since last session.     Pertinent History  osteoporotic vertebral fx 2018, T12 kyphoplasty, recurrent falls, prediabetes, nonischemic cardiomyopathy, neuropathy, melanoma, HTN, HLD, breast CA with chemo and radiation, GERD, facial fx, L carpal tunnel release, brain tumor with craniotomy, asthma, anemia, B TKA, L heart cath 2015    Diagnostic tests  per patient- MD did xray but not sure of results    Patient Stated Goals  "that my back feels better"    Currently in Pain?  No/denies                       [facility] Adult PT Treatment/Exercise - 01/22/18 0001      Self-Care   Self-Care  Lifting    Lifting  edu on proper lifting technique for supine to sit and w/c transfers to decrease lumbar strain when transfering patient's son      Lumbar Exercises: Aerobic   Nustep  L4 x 41mn LEs only      Lumbar Exercises: Standing   Row  Strengthening;Both;10 reps;Theraband    Theraband Level (Row)  Level 2 (Red)    Row Limitations  manual cues for scap retraction and head looking forward    Other Standing Lumbar Exercises  R & L foot standing on foam with 1 UE support and opposite forr stepping fwd/back x10 each LE   cues  to correct ant trunk lean   Other Standing Lumbar Exercises  lateral stepping over cone with B UE support x10 each side   cues for upright trunk and taking larger step     Lumbar Exercises: Seated   Sit to Stand  10 reps   without UE support; better form   Other Seated Lumbar Exercises  STS standing on foam, 1 foam pad under bottom x10    cues to control eccentric lower  Lumbar Exercises: Supine   Other Supine Lumbar Exercises  LTR 20x   review of technique            PT Education - 01/22/18 1211    Education Details  update to HEP; administered red TB; education on proper body mechanics with bed mobility and transfers of her disabled son    Person(s) Educated  Patient    Methods  Explanation;Demonstration;Tactile cues;Verbal cues;Handout    Comprehension  Verbalized understanding       PT Short Term Goals - 01/14/18 1053      PT SHORT TERM GOAL #1   Title  Patient to be independent with initial HEP.    Time  3    Period  Weeks    Status  On-going        PT Long Term Goals - 01/14/18 1054      PT LONG TERM GOAL #1   Title  Patient to be independent with advanced HEP.    Time  6    Period  Weeks    Status  On-going      PT LONG TERM GOAL #2   Title  Patient to demonstrate >=4+/5 strength in B LEs.    Time  6    Period  Weeks    Status  On-going      PT LONG TERM GOAL #3   Title  Patient to demonstrate proper body mechanics and report no pain when lifting 20# box.    Time  6    Period  Weeks    Status  On-going      PT LONG TERM GOAL #4   Title  Patient to report 70% improvement in pain levels when transfering from STS and supine to sit.     Time  6    Period  Weeks    Status  On-going      PT LONG TERM GOAL #5   Title  Patient to score >19/24 on DGI to decrease risk of falls.     Time  6    Period  Weeks    Status  Achieved            Plan - 01/22/18 1212    Clinical Impression Statement  Patient reports that she believes her back is  feeling better. Reports that she is still having to transfer her disabled son- trying to use the [facility] lift whenever it is convenient. Worked on controlled STS on firm and foam surface- patient demonstrated good stability when standing, however limited control on descent. Introduced scapular row with banded resistance with heavy cues for upright posture and scapular retraction. Patient also required cues to correct posture during dynamic balance exercises. Updated HEP to include exercises that were well tolerated today. Patient reported understanding. Ended session with education and demonstration of supine>sit and w/c transfer with proper handling and body mechanics in order to avoid lumbar strain. Patient reported understanding and with no complaints at end of session.     PT Treatment/Interventions  ADLs/Self Care Home Management;Cryotherapy;Electrical Stimulation;Moist Heat;Ultrasound;DME Instruction;Gait training;Stair training;Functional mobility training;Therapeutic activities;Therapeutic exercise;Manual techniques;Patient/family education;Neuromuscular re-education;Balance training;Passive range of motion;Dry needling;Energy conservation;Splinting;Taping    PT Next Visit Plan  ant/lat step ups, progress core strengthening and postural correction    Consulted and Agree with Plan of Care  Patient       Patient will benefit from skilled therapeutic intervention in order to improve the following deficits and impairments:  Decreased endurance, Decreased activity tolerance, Decreased strength,  Pain  Visit Diagnosis: Chronic bilateral low back pain without sciatica  Muscle weakness (generalized)  Abnormal posture  Difficulty in walking, not elsewhere classified     Problem List Patient Active Problem List   Diagnosis Date Noted  . Other headache syndrome 12/23/2017  . Debility 12/23/2017  . Left knee pain 12/23/2017  . Closed fracture of maxilla, initial encounter ([place]) 10/17/2017  .  Vitamin D deficiency 06/14/2017  . Vertebral fracture, osteoporotic, sequela 04/05/2016  . Left-sided back pain 03/06/2016  . Chest pain 02/27/2016  . Syncope 02/27/2016  . Cataracts, bilateral 12/28/2015  . Recurrent falls 12/28/2015  . Genetic testing 11/29/2015  . Family history- stomach cancer 09/08/2015  . Family history of cancer 09/08/2015  . [name] lesion 06/21/2015  . Left leg swelling 05/24/2015  . Medicare annual wellness visit, subsequent 12/27/2014  . 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 Obesity 09/28/2014  . Interstitial cystitis 09/28/2014  . Pain in joint, ankle and foot 09/28/2014  . Abnormal nuclear stress test 12/16/2013  . Sternal pain 11/06/2013  . Postoperative anemia due to acute blood loss 02/13/2013  . Preop cardiovascular exam 12/30/2012  . 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 Breast cancer of lower-inner quadrant of right female breast ([place]) 10/21/2012  . Insomnia 10/18/2012  . UTI (urinary tract infection) 03/03/2012  . Constipation 03/03/2012  . OA (osteoarthritis) 09/07/2011  . Dermatitis   . Hyponatremia 08/28/2011  . Osteoporosis 03/07/2011  . IC (interstitial cystitis)   . Melanoma ([place])   . 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 Prediabetes 06/15/2009  . LIPOMA 02/10/2009  . Dyspnea on exertion 02/10/2009  . Hyperlipidemia, mixed 08/11/2008  . Depression with anxiety 08/11/2008  . Essential hypertension 08/11/2008    Janene Harvey, PT, DPT 01/22/18 12:20 PM   [facility] [address]  [place], [place], [address] Phone: [phone]   Fax:  [phone]  Name: ELEASE SWARM MRN: [id number] Date of Birth: 1946/01/24

## 2018-01-28 ENCOUNTER — Ambulatory Visit: Payer: Medicare Other

## 2018-01-28 DIAGNOSIS — G8929 Other chronic pain: Secondary | ICD-10-CM | POA: Diagnosis not present

## 2018-01-28 DIAGNOSIS — M545 Low back pain, unspecified: Secondary | ICD-10-CM

## 2018-01-28 DIAGNOSIS — R293 Abnormal posture: Secondary | ICD-10-CM | POA: Diagnosis not present

## 2018-01-28 DIAGNOSIS — M6281 Muscle weakness (generalized): Secondary | ICD-10-CM | POA: Diagnosis not present

## 2018-01-28 DIAGNOSIS — R262 Difficulty in walking, not elsewhere classified: Secondary | ICD-10-CM

## 2018-01-28 NOTE — Therapy (Signed)
[facility] [address]  [address] [place], [place], [address] Phone: [phone]   Fax:  [phone]  Physical Therapy Treatment  Patient Details  Name: Shannon [name] MRN: [id number] Date of Birth: 12/17/1945 Referring Provider (PT): Stacey Blyth, MD   Encounter Date: 01/28/2018  PT End of Session - 01/28/18 1030    Visit Number  5    Number of Visits  13    Date for PT Re-Evaluation  02/14/18    Authorization Type  Medicare & BCBS    PT Start Time  1015    PT Stop Time  1128    PT Time Calculation (min)  73 min    Activity Tolerance  Patient tolerated treatment well    Behavior During Therapy  WFL for tasks assessed/performed       Past Medical History:  Diagnosis Date  . 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 Anemia 08/28/2011  . Asthma    as a child  . Brain tumor ([facility])   . Breast cancer ([facility])    right breast  . Carpal tunnel syndrome of left wrist   . Cataracts, bilateral 12/28/2015  . Constipation 03/03/2012  . DEPRESSION 08/11/2008  . Dermatitis   . Facial fracture ([facility])   . GERD 08/11/2008  . H/O measles   . H/O mumps   . History of blood transfusion   . History of chicken pox   . History of hiatal hernia   . 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 HYPERLIPIDEMIA 08/11/2008  . HYPERTENSION 08/11/2008  . Hyponatremia 08/28/2011  . 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 Interstitial cystitis   . LIPOMA 02/10/2009  . Medicare annual wellness visit, subsequent 12/27/2014   Follows with Dr Evans of urology Follows with Dr Patel at [facility] for opthamology Follows with Dr Jordan of dermatology Follows with [facility] gastroenterology, last scope done in 2011 repeat in 10 years Last MGM July 2015 should repeat every 1-2 years Last Pap December 2015, repeat in 3 years.   . Melanoma ([facility]) 2008   DR. JONES  . Neuropathy   . NICM  (nonischemic cardiomyopathy) ([facility]) 03/08/2010   likely 2/2 chemotx - a. Echo 2012: EF 45-50%;  b. Lex MV 2/12:  low risk, apical defect (small area of ischemia vs shifting breast atten);  c.  Echo 7/12: Normal wall thickness, EF 60-65%, normal wall motion, grade 1 diastolic dysfunction, mild LAE, PASP 32;   d. Lex MV 11/13:  EF 76%, no ischemia  . OA (osteoarthritis) 09/07/2011  . Obesity 09/28/2014  . Osteoporosis   . Personal history of chemotherapy 2012  . Personal history of radiation therapy 2012  . PREDIABETES 06/15/2009  . Recurrent falls 12/28/2015  . Shortness of breath   . UTI (lower urinary tract infection) 03/03/2012  . Vertebral fracture, osteoporotic, sequela 04/05/2016    Past Surgical History:  Procedure Laterality Date  . BREAST LUMPECTOMY  04/2009   RIGHT FOR BREAST CANCER-CHEMO/RADIATION X 1 YEAR  . BREAST REDUCTION SURGERY Bilateral 05/04/2014   Procedure: MAMMARY REDUCTION  (BREAST);  Surgeon: Gerald Truesdale, MD;  Location: [facility];  Service: Plastics;  Laterality: Bilateral;  . CARPAL TUNNEL RELEASE     L wrist, ulnar nerve moved  . CHOLECYSTECTOMY    . CRANIOTOMY FOR TUMOR  2000  . ELBOW SURGERY       left  . KNEE ARTHROSCOPY Left 10/12/2014   Procedure: LEFT KNEE ARTHROSCOPY ;  Surgeon: Frank Aluisio, MD;  [facility];  Service: Orthopedics;  Laterality: Left;  . KYPHOPLASTY N/A 03/30/2016   Procedure: THORACIC 12 KYPHOPLASTY;  Surgeon: Mark Dumonski, MD;  [facility];  Service: Orthopedics;  Laterality: N/A;  THORACIC 12 KYPHOPLASTY  . LEFT HEART CATHETERIZATION WITH CORONARY ANGIOGRAM N/A 12/16/2013   Procedure: LEFT HEART CATHETERIZATION WITH CORONARY ANGIOGRAM;  Surgeon: Peter M Jordan, MD;  Location: [facility];  Service: Cardiovascular;  Laterality: N/A;  . LIPOMA EXCISION  03/28/2009   right leg  . MELANOMA EXCISION    . PORT-A-CATH REMOVAL  11/30/2010   Streck  . porta cath    . PORTACATH PLACEMENT  may 2011  . REDUCTION MAMMAPLASTY  Bilateral   . SYNOVECTOMY Left 10/12/2014   Procedure: WITH SYNOVECTOMY;  Surgeon: Frank Aluisio, MD;  [facility];  Service: Orthopedics;  Laterality: Left;  . TONSILLECTOMY  1958  . TOTAL KNEE ARTHROPLASTY  02/05/2012   Procedure: TOTAL KNEE ARTHROPLASTY;  Surgeon: Frank V Aluisio, MD;  [facility];  Service: Orthopedics;  Laterality: Right;  . TOTAL KNEE ARTHROPLASTY Left 02/10/2013   Procedure: LEFT TOTAL KNEE ARTHROPLASTY;  Surgeon: Frank V Aluisio, MD;  [facility];  Service: Orthopedics;  Laterality: Left;  . total knee raplacement  01-2012   Right Knee  . TUBAL LIGATION  1997    There were no vitals filed for this visit.  Subjective Assessment - 01/28/18 1022    Subjective  Pt. noting her she had increased LBP in bed last night.      Pertinent History  osteoporotic vertebral fx 2018, T12 kyphoplasty, recurrent falls, prediabetes, nonischemic cardiomyopathy, neuropathy, melanoma, HTN, HLD, breast CA with chemo and radiation, GERD, facial fx, L carpal tunnel release, brain tumor with craniotomy, asthma, anemia, B TKA, L heart cath 2015    How long can you sit comfortably?  2 hours    How long can you stand comfortably?  90 min     How long can you walk comfortably?  30 min     Diagnostic tests  per patient- MD did xray but not sure of results    Patient Stated Goals  "that my back feels better"    Currently in Pain?  Yes    Pain Score  2     Pain Location  Back    Pain Orientation  Right;Left    Pain Descriptors / Indicators  Dull    Pain Type  Chronic pain    Pain Frequency  Intermittent    Aggravating Factors   advil,     Pain Relieving Factors  laying supine     Multiple Pain Sites  No                       [facility] Adult PT Treatment/Exercise - 01/28/18 1039      Self-Care   Self-Care  Lifting    Lifting  reviewed proper lifting technique for pivot transfer with son at home from seated on bed to wheel chair; discussed hot/cold pack purchase on  [employer] with handout to pt.; discussed using home TENS unit for pain relief with handout issued to pt. for TENS 7000 2nd edition on amaz[employer]       Therapeutic Activites    Therapeutic Activities  --    Lifting  --      Lumbar Exercises: Stretches   Passive   Hamstring Stretch  Right;Left;1 rep;30 seconds      Lumbar Exercises: Aerobic   Nustep  L4 x 36mn LEs only      Lumbar Exercises: Machines for Strengthening   Other Lumbar Machine Exercise  Low row 15# x 15 reps    low handles      Lumbar Exercises: Seated   Sit to Stand  10 reps   without UE support; cues for abdominal bracing    Other Seated Lumbar Exercises  Seated alternating clam shell with red TB at knees x 15 rpes each way       Lumbar Exercises: Supine   Bridge  15 reps      Knee/Hip Exercises: Standing   Lateral Step Up  Right;Left;10 reps;Hand Hold: 1    Lateral Step Up Limitations  at machine     Forward Step Up  Right;Left;Step Height: 6";Hand Hold: 1;15 reps    Forward Step Up Limitations  at machine       Moist Heat Therapy   Number Minutes Moist Heat  15 Minutes    Moist Heat Location  Lumbar Spine      Electrical Stimulation   Electrical Stimulation Location  lumbar spine     Electrical Stimulation Action  IFC    Electrical Stimulation Parameters  to tolerance, 15'    Electrical Stimulation Goals  Pain             PT Education - 01/28/18 1056    Education Details  issued handout for hod/cold pack, and TENS 7000 2nd edition on [employer] Educated  Patient    Methods  Explanation;Demonstration;Verbal cues;Handout    Comprehension  Verbalized understanding;Returned demonstration;Verbal cues required;Need further instruction       PT Short Term Goals - 01/28/18 1140      PT SHORT TERM GOAL #1   Title  Patient to be independent with initial HEP.    Time  3    Period  Weeks    Status  Achieved        PT Long Term Goals - 01/14/18 1054      PT LONG TERM GOAL #1   Title  Patient to  be independent with advanced HEP.    Time  6    Period  Weeks    Status  On-going      PT LONG TERM GOAL #2   Title  Patient to demonstrate >=4+/5 strength in B LEs.    Time  6    Period  Weeks    Status  On-going      PT LONG TERM GOAL #3   Title  Patient to demonstrate proper body mechanics and report no pain when lifting 20# box.    Time  6    Period  Weeks    Status  On-going      PT LONG TERM GOAL #4   Title  Patient to report 70% improvement in pain levels when transfering from STS and supine to sit.     Time  6    Period  Weeks    Status  On-going      PT LONG TERM GOAL #5   Title  Patient to score >19/24 on DGI to decrease risk of falls.     Time  6    Period  Weeks    Status  Achieved            Plan - 01/28/18 1057    Clinical Impression  Statement  Kalee reporting some improvement in tolerance for lifting son from bed to [facility] at home since reviewing body mechanics with this task in therapy.  Tolerated mild advancement in lumbopelvic strengthening activities in session well today.  Verbally reviewed proper pivot transfer lifting technique again with pt. today as to reduce lumbar strain with caring for son at home.  Discussed and issued handout for TENS 7000 2nd edition home TENS unit and hot/cold pack as pt. planning to purchase these items on [place] over upcoming week.  Will plan to progress therex and review body mechanics with lifting prn in coming sessions.  Progressing well toward goals.      PT Frequency  2x / week    PT Duration  6 weeks    PT Treatment/Interventions  ADLs/Self Care Home Management;Cryotherapy;Electrical Stimulation;Moist Heat;Ultrasound;DME Instruction;Gait training;Stair training;Functional mobility training;Therapeutic activities;Therapeutic exercise;Manual techniques;Patient/family education;Neuromuscular re-education;Balance training;Passive range of motion;Dry needling;Energy conservation;Splinting;Taping    PT Next Visit Plan  progress  core strengthening and postural correction; lifting technique review     Consulted and Agree with Plan of Care  Patient       Patient will benefit from skilled therapeutic intervention in order to improve the following deficits and impairments:  Decreased endurance, Decreased activity tolerance, Decreased strength, Pain  Visit Diagnosis: Chronic bilateral low back pain without sciatica  Muscle weakness (generalized)  Abnormal posture  Difficulty in walking, not elsewhere classified     Problem List Patient Active Problem List   Diagnosis Date Noted  . Other headache syndrome 12/23/2017  . Debility 12/23/2017  . Left knee pain 12/23/2017  . Closed fracture of maxilla, initial encounter ([place]) 10/17/2017  . Vitamin D deficiency 06/14/2017  . Vertebral fracture, osteoporotic, sequela 04/05/2016  . Left-sided back pain 03/06/2016  . Chest pain 02/27/2016  . Syncope 02/27/2016  . Cataracts, bilateral 12/28/2015  . Recurrent falls 12/28/2015  . Genetic testing 11/29/2015  . Family history- stomach cancer 09/08/2015  . Family history of cancer 09/08/2015  . [name] lesion 06/21/2015  . Left leg swelling 05/24/2015  . Medicare annual wellness visit, subsequent 12/27/2014  . 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 Obesity 09/28/2014  . Interstitial cystitis 09/28/2014  . Pain in joint, ankle and foot 09/28/2014  . Abnormal nuclear stress test 12/16/2013  . Sternal pain 11/06/2013  . Postoperative anemia due to acute blood loss 02/13/2013  . Preop cardiovascular exam 12/30/2012  . 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 Breast cancer of lower-inner quadrant of right female breast ([place]) 10/21/2012  . Insomnia 10/18/2012  . UTI (urinary tract infection) 03/03/2012  . Constipation 03/03/2012  . OA (osteoarthritis) 09/07/2011  . Dermatitis   . Hyponatremia 08/28/2011  . Osteoporosis 03/07/2011  . IC (interstitial cystitis)    . Melanoma ([place])   . Craniopharyngioma ([place]) 03/25/2010  . Prediabetes 06/15/2009  . LIPOMA 02/10/2009  . Dyspnea on exertion 02/10/2009  . Hyperlipidemia, mixed 08/11/2008  . Depression with anxiety 08/11/2008  . Essential hypertension 08/11/2008    Bess Harvest, PTA 01/28/18 11:41 AM   [facility] [address]  [place], [place], [address] Phone: [phone]   Fax:  [phone]  Name: JUHI LAGRANGE MRN: [id number] Date of Birth: October 27, 1945

## 2018-01-28 NOTE — Patient Instructions (Addendum)
TENS stands for Transcutaneous Electrical Nerve Stimulation. In other words, electrical impulses are allowed to pass through the skin in order to excite a nerve.   Purpose and Use of TENS:  TENS is a method used to manage acute and chronic pain without the use of drugs. It has been effective in managing pain associated with surgery, sprains, strains, trauma, rheumatoid arthritis, and neuralgias. It is a non-addictive, low risk, and non-invasive technique used to control pain. It is not, by any means, a curative form of treatment.   How TENS Works:  Most TENS units are a small pocket-sized unit powered by one 9 volt battery. Attached to the outside of the unit are two lead wires where two pins and/or snaps connect on each wire. All units come with a set of four reusable pads or electrodes. These are placed on the skin surrounding the area involved. By inserting the leads into  the pads, the electricity can pass from the unit making the circuit complete.  As the intensity is turned up slowly, the electrical current enters the body from the electrodes through the skin to the surrounding nerve fibers. This triggers the release of hormones from within the body. These hormones contain pain relievers. By increasing the circulation of these hormones, the person's pain may be lessened. It is also believed that the electrical stimulation itself helps to block the pain messages being sent to the brain, thus also decreasing the body's perception of pain.   Hazards:  TENS units are NOT to be used by patients with PACEMAKERS, DEFIBRILLATORS, DIABETIC PUMPS, PREGNANT WOMEN, and patients with SEIZURE DISORDERS.  TENS units are NOT to be used over the heart, throat, brain, or spinal cord.  One of the major side effects from the TENS unit may be skin irritation. Some people may develop a rash if they are sensitive to the materials used in the electrodes or the connecting wires.   Wear the unit for 15'.   Avoid overuse  due the body getting used to the stem making it not as effective over time.   TENS UNIT  This is helpful for muscle pain and spasm.   Search and Purchase a TENS 7000 2nd edition at www.tenspros.com or www.amazon.com  (It should be less than $30)     TENS unit instructions:   Do not shower or bathe with the unit on  Turn the unit off before removing electrodes or batteries  If the electrodes lose stickiness add a drop of water to the electrodes after they are disconnected from the unit and place on plastic sheet. If you continued to have difficulty, call the TENS unit company to purchase more electrodes.  Do not apply lotion on the skin area prior to use. Make sure the skin is clean and dry as this will help prolong the life of the electrodes.  After use, always check skin for unusual red areas, rash or other skin difficulties. If there are any skin problems, does not apply electrodes to the same area.  Never remove the electrodes from the unit by pulling the wires.  Do not use the TENS unit or electrodes other than as directed.  Do not change electrode placement without consulting your therapist or physician.  Keep 2 fingers with between each electrode.  

## 2018-01-31 ENCOUNTER — Ambulatory Visit: Payer: Medicare Other | Attending: Family Medicine

## 2018-01-31 DIAGNOSIS — R262 Difficulty in walking, not elsewhere classified: Secondary | ICD-10-CM | POA: Diagnosis not present

## 2018-01-31 DIAGNOSIS — M545 Low back pain: Secondary | ICD-10-CM | POA: Diagnosis not present

## 2018-01-31 DIAGNOSIS — G8929 Other chronic pain: Secondary | ICD-10-CM | POA: Diagnosis not present

## 2018-01-31 DIAGNOSIS — M6281 Muscle weakness (generalized): Secondary | ICD-10-CM | POA: Diagnosis not present

## 2018-01-31 DIAGNOSIS — R293 Abnormal posture: Secondary | ICD-10-CM | POA: Diagnosis not present

## 2018-01-31 NOTE — Therapy (Signed)
[facility] [address]  [place], [place], [address] Phone: [phone]   Fax:  [phone]  Physical Therapy Treatment  Patient Details  Name: Shannon [name] MRN: [id number] Date of Birth: 1945/12/13 Referring Provider (PT): Penni Homans, MD   Encounter Date: 01/31/2018  PT End of Session - 01/31/18 1046    Visit Number  6    Number of Visits  13    Date for PT Re-Evaluation  02/14/18    Authorization Type  Medicare & BCBS    PT Start Time  6629    PT Stop Time  1115    PT Time Calculation (min)  57 min    Activity Tolerance  Patient tolerated treatment well    Behavior During Therapy  [facility] for tasks assessed/performed       Past Medical History:  Diagnosis Date  . 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 Anemia 08/28/2011  . Asthma    as a child  . Brain tumor ([place])   . Breast cancer ([place])    right breast  . Carpal tunnel syndrome of left wrist   . Cataracts, bilateral 12/28/2015  . Constipation 03/03/2012  . DEPRESSION 08/11/2008  . Dermatitis   . Facial fracture ([place])   . GERD 08/11/2008  . H/O measles   . H/O mumps   . History of blood transfusion   . History of chicken pox   . History of hiatal hernia   . 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 HYPERLIPIDEMIA 08/11/2008  . HYPERTENSION 08/11/2008  . Hyponatremia 08/28/2011  . 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 Interstitial cystitis   . LIPOMA 02/10/2009  . Medicare annual wellness visit, subsequent 12/27/2014   Follows with Dr Amalia Hailey of urology Follows with Dr Posey Pronto at [facility] for opthamology Follows with Dr [name] of dermatology Follows with [facility] gastroenterology, last scope done in 2011 repeat in 10 years Last [facility] July 2015 should repeat every 1-2 years Last Pap December 2015, repeat in 3 years.   . Melanoma ([place]) 2008   DR. Ronnald Ramp  . Neuropathy   . NICM (nonischemic  cardiomyopathy) ([place]) 03/08/2010   likely 2/2 chemotx - a. Echo 2012: EF 45-50%;  b. Lex MV 2/12:  low risk, apical defect (small area of ischemia vs shifting breast atten);  c.  Echo 7/12: Normal wall thickness, EF 60-65%, normal wall motion, grade 1 diastolic dysfunction, mild LAE, PASP 32;   d. Lex MV 11/13:  EF 76%, no ischemia  . OA (osteoarthritis) 09/07/2011  . Obesity 09/28/2014  . Osteoporosis   . Personal history of chemotherapy 2012  . Personal history of radiation therapy 2012  . PREDIABETES 06/15/2009  . Recurrent falls 12/28/2015  . Shortness of breath   . UTI (lower urinary tract infection) 03/03/2012  . Vertebral fracture, osteoporotic, sequela 04/05/2016    Past Surgical History:  Procedure Laterality Date  . BREAST LUMPECTOMY  04/2009   RIGHT FOR BREAST CANCER-CHEMO/RADIATION X 1 YEAR  . BREAST REDUCTION SURGERY Bilateral 05/04/2014   Procedure: MAMMARY REDUCTION  (BREAST);  Surgeon: Cristine Polio, MD;  Location: [facility];  Service: Plastics;  Laterality: Bilateral;  . CARPAL TUNNEL RELEASE     L wrist, ulnar nerve moved  . CHOLECYSTECTOMY    . CRANIOTOMY FOR TUMOR  2000  . ELBOW SURGERY  left  . KNEE ARTHROSCOPY Left 10/12/2014   Procedure: LEFT KNEE ARTHROSCOPY ;  Surgeon: Gaynelle Arabian, MD;  Location: [facility];  Service: Orthopedics;  Laterality: Left;  . KYPHOPLASTY N/A 03/30/2016   Procedure: THORACIC 12 KYPHOPLASTY;  Surgeon: Phylliss Bob, MD;  Location: [facility];  Service: Orthopedics;  Laterality: N/A;  THORACIC 12 KYPHOPLASTY  . LEFT HEART CATHETERIZATION WITH CORONARY ANGIOGRAM N/A 12/16/2013   Procedure: LEFT HEART CATHETERIZATION WITH CORONARY ANGIOGRAM;  Surgeon: Peter M [name], MD;  Location: [facility] CATH LAB;  Service: Cardiovascular;  Laterality: N/A;  . LIPOMA EXCISION  03/28/2009   right leg  . MELANOMA EXCISION    . PORT-A-CATH REMOVAL  11/30/2010   [place]reck  . porta cath    . PORTACATH PLACEMENT  may 2011  . REDUCTION MAMMAPLASTY Bilateral   .  SYNOVECTOMY Left 10/12/2014   Procedure: WITH SYNOVECTOMY;  Surgeon: Gaynelle Arabian, MD;  Location: [facility];  Service: Orthopedics;  Laterality: Left;  . TONSILLECTOMY  1958  . TOTAL KNEE ARTHROPLASTY  02/05/2012   Procedure: TOTAL KNEE ARTHROPLASTY;  Surgeon: Gearlean Alf, MD;  Location: [facility];  Service: Orthopedics;  Laterality: Right;  . TOTAL KNEE ARTHROPLASTY Left 02/10/2013   Procedure: LEFT TOTAL KNEE ARTHROPLASTY;  Surgeon: Gearlean Alf, MD;  Location: [facility];  Service: Orthopedics;  Laterality: Left;  . total knee raplacement  01-2012   Right Knee  . TUBAL LIGATION  1997    There were no vitals filed for this visit.  Subjective Assessment - 01/31/18 1025    Subjective  Pt. reporting partial compliance with HEP.      Pertinent History  osteoporotic vertebral fx 2018, T12 kyphoplasty, recurrent falls, prediabetes, nonischemic cardiomyopathy, neuropathy, melanoma, HTN, HLD, breast CA with chemo and radiation, GERD, facial fx, L carpal tunnel release, brain tumor with craniotomy, asthma, anemia, B TKA, L heart cath 2015    Diagnostic tests  per patient- MD did xray but not sure of results    Patient Stated Goals  "that my back feels better"    Currently in Pain?  Yes    Pain Score  4     Pain Location  Back    Pain Orientation  Lower;Right;Left    Pain Descriptors / Indicators  Dull    Pain Type  Chronic pain    Aggravating Factors   lifting son, vacuuming    Multiple Pain Sites  No                       [facility] Adult PT Treatment/Exercise - 01/31/18 1046      Self-Care   Self-Care  Other Self-Care Comments    Other Self-Care Comments   Reviewed proper posture and body mechanics with vacuuming, sweeping, and other household activities as to reduce lumbar strain       Lumbar Exercises: Aerobic   Nustep  L4 x 70mn LEs only      Lumbar Exercises: Standing   Functional Squats  15 reps;3 seconds    Functional Squats Limitations  chair with red looped TB at  ankles and B isometric hip abd/ER    Other Standing Lumbar Exercises  B side stepping with red TB at ankles 2 x 20 ft       Lumbar Exercises: Supine   Dead Bug  10 reps;3 seconds    Dead Bug Limitations  Much difficulty with sequencing      Lumbar Exercises: Sidelying   Other Sidelying Lumbar Exercises  open book stretch x 10 each side      Moist Heat Therapy   Number Minutes Moist Heat  15 Minutes    Moist Heat Location  Lumbar Spine      Electrical Stimulation   Electrical Stimulation Location  lumbar spine     Electrical Stimulation Action  IFC    Electrical Stimulation Parameters  to tolerance, 15'    Electrical Stimulation Goals  Pain               PT Short Term Goals - 01/28/18 1140      PT SHORT TERM GOAL #1   Title  Patient to be independent with initial HEP.    Time  3    Period  Weeks    Status  Achieved        PT Long Term Goals - 01/14/18 1054      PT LONG TERM GOAL #1   Title  Patient to be independent with advanced HEP.    Time  6    Period  Weeks    Status  On-going      PT LONG TERM GOAL #2   Title  Patient to demonstrate >=4+/5 strength in B LEs.    Time  6    Period  Weeks    Status  On-going      PT LONG TERM GOAL #3   Title  Patient to demonstrate proper body mechanics and report no pain when lifting 20# box.    Time  6    Period  Weeks    Status  On-going      PT LONG TERM GOAL #4   Title  Patient to report 70% improvement in pain levels when transfering from STS and supine to sit.     Time  6    Period  Weeks    Status  On-going      PT LONG TERM GOAL #5   Title  Patient to score >19/24 on DGI to decrease risk of falls.     Time  6    Period  Weeks    Status  Achieved            Plan - 01/31/18 1047    Clinical Impression Statement  Pt. reporting partial compliance with HEP which she attributes to being busy with holidays and caring for son.  Tolerated progression of lumbopelvic strengthening activities in session  well today.  Did requiring mod cueing with Dead bug abdominal strengthening activity for proper sequencing.  Added side stepping with red TB resistance at ankles with pt. demonstrating notable lateral instability requiring supervision from therapist for safety.  Will continue to progress toward goals.      Rehab Potential  Good    PT Frequency  2x / week    PT Duration  6 weeks    PT Treatment/Interventions  ADLs/Self Care Home Management;Cryotherapy;Electrical Stimulation;Moist Heat;Ultrasound;DME Instruction;Gait training;Stair training;Functional mobility training;Therapeutic activities;Therapeutic exercise;Manual techniques;Patient/family education;Neuromuscular re-education;Balance training;Passive range of motion;Dry needling;Energy conservation;Splinting;Taping    PT Next Visit Plan  progress core strengthening and postural correction; lifting technique review     Consulted and Agree with Plan of Care  Patient       Patient will benefit from skilled therapeutic intervention in order to improve the following deficits and impairments:  Decreased endurance, Decreased activity tolerance, Decreased strength, Pain  Visit Diagnosis: Chronic bilateral low back pain without sciatica  Muscle weakness (generalized)  Abnormal posture  Difficulty in walking, not elsewhere  classified     Problem List Patient Active Problem List   Diagnosis Date Noted  . Other headache syndrome 12/23/2017  . Debility 12/23/2017  . Left knee pain 12/23/2017  . Closed fracture of maxilla, initial encounter ([place]) 10/17/2017  . Vitamin D deficiency 06/14/2017  . Vertebral fracture, osteoporotic, sequela 04/05/2016  . Left-sided back pain 03/06/2016  . Chest pain 02/27/2016  . Syncope 02/27/2016  . Cataracts, bilateral 12/28/2015  . Recurrent falls 12/28/2015  . Genetic testing 11/29/2015  . Family history- stomach cancer 09/08/2015  . Family history of cancer 09/08/2015  . [name] lesion  06/21/2015  . Left leg swelling 05/24/2015  . Medicare annual wellness visit, subsequent 12/27/2014  . 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 Obesity 09/28/2014  . Interstitial cystitis 09/28/2014  . Pain in joint, ankle and foot 09/28/2014  . Abnormal nuclear stress test 12/16/2013  . Sternal pain 11/06/2013  . Postoperative anemia due to acute blood loss 02/13/2013  . Preop cardiovascular exam 12/30/2012  . 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 Breast cancer of lower-inner quadrant of right female breast ([place]) 10/21/2012  . Insomnia 10/18/2012  . UTI (urinary tract infection) 03/03/2012  . Constipation 03/03/2012  . OA (osteoarthritis) 09/07/2011  . Dermatitis   . Hyponatremia 08/28/2011  . Osteoporosis 03/07/2011  . IC (interstitial cystitis)   . Melanoma ([place])   . Craniopharyngioma ([place]) 03/25/2010  . Prediabetes 06/15/2009  . LIPOMA 02/10/2009  . Dyspnea on exertion 02/10/2009  . Hyperlipidemia, mixed 08/11/2008  . Depression with anxiety 08/11/2008  . Essential hypertension 08/11/2008    Bess Harvest, PTA 01/31/18 12:20 PM    [facility] [address]  [place], [place], [address] Phone: [phone]   Fax:  [phone]  Name: JACKALYN HAITH MRN: [id number] Date of Birth: [birth year]

## 2018-01-31 NOTE — Patient Instructions (Signed)

## 2018-02-04 ENCOUNTER — Encounter: Payer: Self-pay | Admitting: Family Medicine

## 2018-02-04 ENCOUNTER — Ambulatory Visit (INDEPENDENT_AMBULATORY_CARE_PROVIDER_SITE_OTHER): Payer: Medicare Other | Admitting: Family Medicine

## 2018-02-04 ENCOUNTER — Ambulatory Visit: Payer: Medicare Other | Admitting: Physical Therapy

## 2018-02-04 VITALS — BP 99/67 | HR 70 | Temp 97.7°F | Resp 18 | Ht 61.0 in | Wt 178.0 lb

## 2018-02-04 DIAGNOSIS — E782 Mixed hyperlipidemia: Secondary | ICD-10-CM | POA: Diagnosis not present

## 2018-02-04 DIAGNOSIS — N3010 Interstitial cystitis (chronic) without hematuria: Secondary | ICD-10-CM

## 2018-02-04 DIAGNOSIS — M810 Age-related osteoporosis without current pathological fracture: Secondary | ICD-10-CM | POA: Diagnosis not present

## 2018-02-04 DIAGNOSIS — R7303 Prediabetes: Secondary | ICD-10-CM

## 2018-02-04 DIAGNOSIS — N390 Urinary tract infection, site not specified: Secondary | ICD-10-CM | POA: Diagnosis not present

## 2018-02-04 DIAGNOSIS — I10 Essential (primary) hypertension: Secondary | ICD-10-CM

## 2018-02-04 LAB — LIPID PANEL
Cholesterol: 174 mg/dL (ref 0–200)
HDL: 41.6 mg/dL (ref >39.00)
NonHDL: 132.25
Total CHOL/HDL Ratio: 4
Triglycerides: 275 mg/dL — ABNORMAL HIGH (ref 0.0–149.0)
VLDL: 55 mg/dL — ABNORMAL HIGH (ref 0.0–40.0)

## 2018-02-04 LAB — CBC
HCT: 38.6 % (ref 36.0–46.0)
HEMOGLOBIN: 12.7 g/dL (ref 12.0–15.0)
MCHC: 32.9 g/dL (ref 30.0–36.0)
MCV: 88.9 fl (ref 78.0–100.0)
PLATELETS: 230 10*3/uL (ref 150.0–400.0)
RBC: 4.34 Mil/uL (ref 3.87–5.11)
RDW: 13.7 % (ref 11.5–15.5)
WBC: 6.9 10*3/uL (ref 4.0–10.5)

## 2018-02-04 LAB — URINALYSIS
Bilirubin Urine: NEGATIVE
HGB URINE DIPSTICK: NEGATIVE
Ketones, ur: NEGATIVE
Leukocytes, UA: NEGATIVE
NITRITE: NEGATIVE
Specific Gravity, Urine: <=1.005 — AB (ref 1.000–1.030)
Total Protein, Urine: NEGATIVE
UROBILINOGEN UA: 0.2 (ref 0.0–1.0)
Urine Glucose: NEGATIVE
pH: 6.5 (ref 5.0–8.0)

## 2018-02-04 LAB — COMPREHENSIVE METABOLIC PANEL
ALT: 19 U/L (ref 0–35)
AST: 20 U/L (ref 0–37)
Albumin: 4 g/dL (ref 3.5–5.2)
Alkaline Phosphatase: 91 U/L (ref 39–117)
BUN: 12 mg/dL (ref 6–23)
CO2: 30 mEq/L (ref 19–32)
Calcium: 9.6 mg/dL (ref 8.4–10.5)
Chloride: 107 mEq/L (ref 96–112)
Creatinine, Ser: 0.82 mg/dL (ref 0.40–1.20)
GFR: 72.5 mL/min (ref >60.00)
GLUCOSE: 99 mg/dL (ref 70–99)
Potassium: 5.1 mEq/L (ref 3.5–5.1)
Sodium: 145 mEq/L (ref 135–145)
TOTAL PROTEIN: 6.2 g/dL (ref 6.0–8.3)
Total Bilirubin: 0.4 mg/dL (ref 0.2–1.2)

## 2018-02-04 LAB — LDL CHOLESTEROL, DIRECT: Direct LDL: 73 mg/dL

## 2018-02-04 LAB — TSH: TSH: 1.98 u[IU]/mL (ref 0.35–4.50)

## 2018-02-04 LAB — VITAMIN D 25 HYDROXY (VIT D DEFICIENCY, FRACTURES): VITD: 36.45 ng/mL (ref 30.00–100.00)

## 2018-02-04 LAB — HEMOGLOBIN A1C: Hgb A1c MFr Bld: 5.7 % (ref 4.6–6.5)

## 2018-02-04 MED ORDER — TELMISARTAN 80 MG PO TABS: 40.0000 mg | ORAL_TABLET | Freq: Every day | ORAL | Status: DC

## 2018-02-04 NOTE — Patient Instructions (Signed)

## 2018-02-05 ENCOUNTER — Ambulatory Visit (INDEPENDENT_AMBULATORY_CARE_PROVIDER_SITE_OTHER): Payer: Medicare Other | Admitting: Psychology

## 2018-02-05 DIAGNOSIS — F4323 Adjustment disorder with mixed anxiety and depressed mood: Secondary | ICD-10-CM

## 2018-02-05 LAB — URINE CULTURE
MICRO NUMBER: 15723
RESULT: NO GROWTH
SPECIMEN QUALITY: ADEQUATE

## 2018-02-06 NOTE — Assessment & Plan Note (Signed)
Still struggles with dysuria at times. Will proceed with UA and culture

## 2018-02-06 NOTE — Assessment & Plan Note (Signed)
Well controlled, no changes to meds. Encouraged heart healthy diet such as the DASH diet and exercise as tolerated.  °

## 2018-02-06 NOTE — Assessment & Plan Note (Signed)
[id number] acceptable, minimize simple carbs. Increase exercise as tolerated.  

## 2018-02-06 NOTE — Progress Notes (Signed)
Subjective:    Patient ID: Shannon [name], female    DOB: Oct 16, 1945, 73 y.o.   MRN: [id number]  Chief Complaint  Patient presents with  . Follow-up  . Back Pain    From lifting son up and down everyday    HPI Patient is in today for follow up and is struggling with ongoing fatigue, stress, anhedonia and more. She continues to with low back pain and it is flared recently. No recent fall or trauma but she does have to move her disabled son around. No incontinence but she does still have some trouble with dysuria and some frequency. No fevers or chills. Denies CP/palp/SOB/HA/congestion/fevers/GI c/o. Taking meds as prescribed  Past Medical History:  Diagnosis Date  . 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 Anemia 08/28/2011  . Asthma    as a child  . Brain tumor ([place])   . Breast cancer ([place])    right breast  . Carpal tunnel syndrome of left wrist   . Cataracts, bilateral 12/28/2015  . Constipation 03/03/2012  . DEPRESSION 08/11/2008  . Dermatitis   . Facial fracture ([place])   . GERD 08/11/2008  . H/O measles   . H/O mumps   . History of blood transfusion   . History of chicken pox   . History of hiatal hernia   . 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 HYPERLIPIDEMIA 08/11/2008  . HYPERTENSION 08/11/2008  . Hyponatremia 08/28/2011  . 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 Interstitial cystitis   . LIPOMA 02/10/2009  . Medicare annual wellness visit, subsequent 12/27/2014   Follows with Dr Amalia Hailey of urology Follows with Dr Posey Pronto at [facility] for opthamology Follows with Dr [name] of dermatology Follows with [facility] gastroenterology, last scope done in 2011 repeat in 10 years Last [facility] July 2015 should repeat every 1-2 years Last Pap December 2015, repeat in 3 years.   . Melanoma ([place]) 2008   DR. Ronnald Ramp  . Neuropathy   . NICM (nonischemic cardiomyopathy) ([place]) 03/08/2010   likely 2/2 chemotx - a. Echo 2012: EF  45-50%;  b. Lex MV 2/12:  low risk, apical defect (small area of ischemia vs shifting breast atten);  c.  Echo 7/12: Normal wall thickness, EF 60-65%, normal wall motion, grade 1 diastolic dysfunction, mild LAE, PASP 32;   d. Lex MV 11/13:  EF 76%, no ischemia  . OA (osteoarthritis) 09/07/2011  . Obesity 09/28/2014  . Osteoporosis   . Personal history of chemotherapy 2012  . Personal history of radiation therapy 2012  . PREDIABETES 06/15/2009  . Recurrent falls 12/28/2015  . Shortness of breath   . UTI (lower urinary tract infection) 03/03/2012  . Vertebral fracture, osteoporotic, sequela 04/05/2016    Past Surgical History:  Procedure Laterality Date  . BREAST LUMPECTOMY  04/2009   RIGHT FOR BREAST CANCER-CHEMO/RADIATION X 1 YEAR  . BREAST REDUCTION SURGERY Bilateral 05/04/2014   Procedure: MAMMARY REDUCTION  (BREAST);  Surgeon: Cristine Polio, MD;  Location: [facility];  Service: Plastics;  Laterality: Bilateral;  . CARPAL TUNNEL RELEASE     L wrist, ulnar nerve moved  . CHOLECYSTECTOMY    . CRANIOTOMY FOR TUMOR  2000  . ELBOW SURGERY     left  . KNEE ARTHROSCOPY Left 10/12/2014   Procedure: LEFT KNEE ARTHROSCOPY ;  Surgeon: Gaynelle Arabian, MD;  Location: [facility];  Service:  Orthopedics;  Laterality: Left;  . KYPHOPLASTY N/A 03/30/2016   Procedure: THORACIC 12 KYPHOPLASTY;  Surgeon: Phylliss Bob, MD;  Location: [facility];  Service: Orthopedics;  Laterality: N/A;  THORACIC 12 KYPHOPLASTY  . LEFT HEART CATHETERIZATION WITH CORONARY ANGIOGRAM N/A 12/16/2013   Procedure: LEFT HEART CATHETERIZATION WITH CORONARY ANGIOGRAM;  Surgeon: Peter M [name], MD;  Location: [facility] CATH [facility];  Service: Cardiovascular;  Laterality: N/A;  . LIPOMA EXCISION  03/28/2009   right leg  . MELANOMA EXCISION    . PORT-A-CATH REMOVAL  11/30/2010   [place]reck  . porta cath    . PORTACATH PLACEMENT  may 2011  . REDUCTION MAMMAPLASTY Bilateral   . SYNOVECTOMY Left 10/12/2014   Procedure: WITH SYNOVECTOMY;  Surgeon:  Gaynelle Arabian, MD;  Location: [facility];  Service: Orthopedics;  Laterality: Left;  . TONSILLECTOMY  1958  . TOTAL KNEE ARTHROPLASTY  02/05/2012   Procedure: TOTAL KNEE ARTHROPLASTY;  Surgeon: Gearlean Alf, MD;  Location: [facility];  Service: Orthopedics;  Laterality: Right;  . TOTAL KNEE ARTHROPLASTY Left 02/10/2013   Procedure: LEFT TOTAL KNEE ARTHROPLASTY;  Surgeon: Gearlean Alf, MD;  Location: [facility];  Service: Orthopedics;  Laterality: Left;  . total knee raplacement  01-2012   Right Knee  . TUBAL LIGATION  1997    Family History  Problem Relation Age of Onset  . Breast cancer Mother        sarcoma  . Lung cancer Mother   . Hypertension Mother   . Prostate cancer Father   . Congestive Heart Failure Father   . Heart attack Father   . Prostate cancer Brother   . Down syndrome Son   . CVA Son   . Heart disease Maternal Grandfather        MI  . Stomach cancer Maternal Aunt   . Uterine cancer Maternal Aunt   . Colon cancer Neg Hx     Social History   Socioeconomic History  . Marital status: Married    Spouse name: Deidre Ala  . Number of children: 3  . Years of education: Not on file  . Highest education level: Not on file  Occupational History  . Occupation: Copywriter, advertising: [employer]  Social Needs  . Financial resource strain: Not on file  . Food insecurity:    Worry: Not on file    Inability: Not on file  . Transportation needs:    Medical: Not on file    Non-medical: Not on file  Tobacco Use  . Smoking status: Never Smoker  . Smokeless tobacco: Never Used  Substance and Sexual Activity  . Alcohol use: No  . Drug use: No  . Sexual activity: Never  Lifestyle  . Physical activity:    Days per week: Not on file    Minutes per session: Not on file  . Stress: Not on file  Relationships  . Social connections:    Talks on phone: Not on file    Gets together: Not on file    Attends religious service: Not on file    Active member of club or  organization: Not on file    Attends meetings of clubs or organizations: Not on file    Relationship status: Not on file  . Intimate partner violence:    Fear of current or ex partner: Not on file    Emotionally abused: Not on file    Physically abused: Not on file    Forced sexual activity: Not  on file  Other Topics Concern  . Not on file  Social History Narrative   Patient is right-handed. She lives with her husband in a 4 story home. They take care of their grown son with down syndrome who has had a stroke. She drinks 1-2 glasses of tea in the evenings. She does not formally exercise.    Outpatient Medications Prior to Visit  Medication Sig Dispense Refill  . acetaminophen (TYLENOL) 500 MG tablet Take 500-1,000 mg by mouth every 6 (six) hours as needed for moderate pain or headache.     Marland Kitchen aspirin EC 81 MG tablet Take 81 mg by mouth daily.    . B Complex-C (SUPER B COMPLEX PO) Take 1 tablet by mouth daily.    . carvedilol (COREG) 25 MG tablet Take 0.5 tablets (12.5 mg total) by mouth 2 (two) times daily with a meal. Hold if your blood pressure is <120/80 30 tablet 0  . clonazePAM (KLONOPIN) 0.5 MG tablet TAKE 1/2 TO 1 TABLET BY MOUTH 3 TIMES A DAY AS NEEDED FOR ANXIETY 60 tablet 2  . ferrous sulfate 325 (65 FE) MG tablet Take 325 mg by mouth daily with breakfast.    . folic acid (FOLVITE) 1 MG tablet TAKE 1 TABLET BY MOUTH EVERY DAY 90 tablet 3  . gabapentin (NEURONTIN) 300 MG capsule TAKE 1 CAPSULE BY MOUTH TWICE A DAY (Patient taking differently: daily. ) 180 capsule 1  . hydrOXYzine (ATARAX/VISTARIL) 25 MG tablet Take 25 mg by mouth at bedtime.    . Multiple Vitamin (MULTIVITAMIN WITH MINERALS) TABS tablet Take 1 tablet by mouth daily.    Marland Kitchen nystatin (MYCOSTATIN/NYSTOP) powder Apply topically 4 (four) times daily. 15 g 0  . pentosan polysulfate (ELMIRON) 100 MG capsule Take 100 mg by mouth 2 (two) times daily.     . rosuvastatin (CRESTOR) 20 MG tablet TAKE 1 TABLET BY MOUTH EVERYDAY  AT BEDTIME 90 tablet 3  . venlafaxine XR (EFFEXOR-XR) 75 MG 24 hr capsule Take 2 capsules (150 mg total) by mouth daily with breakfast. 180 capsule 2  . telmisartan (MICARDIS) 80 MG tablet TAKE 1 TABLET BY MOUTH EVERY DAY (Patient taking differently: 40 mg. ) 90 tablet 1   No facility-administered medications prior to visit.     Allergies  Allergen Reactions  . Dilaudid [Hydromorphone Hcl] Itching    Review of Systems  Constitutional: Positive for malaise/fatigue. Negative for fever.  HENT: Negative for congestion.   Eyes: Negative for blurred vision.  Respiratory: Negative for shortness of breath.   Cardiovascular: Negative for chest pain, palpitations and leg swelling.  Gastrointestinal: Negative for abdominal pain, blood in stool and nausea.  Genitourinary: Positive for frequency. Negative for dysuria.  Musculoskeletal: Positive for back pain. Negative for falls.  Skin: Negative for rash.  Neurological: Negative for dizziness, loss of consciousness and headaches.  Endo/Heme/Allergies: Negative for environmental allergies.  Psychiatric/Behavioral: Positive for depression. The patient is nervous/anxious and has insomnia.        Objective:    Physical Exam Vitals signs and nursing note reviewed.  Constitutional:      General: She is not in acute distress.    Appearance: She is well-developed.  HENT:     Head: Normocephalic and atraumatic.     Nose: Nose normal.  Eyes:     General:        Right eye: No discharge.        Left eye: No discharge.  Neck:     Musculoskeletal: Normal  range of motion and neck supple.  Cardiovascular:     Rate and Rhythm: Normal rate and regular rhythm.     Heart sounds: No murmur.  Pulmonary:     Effort: Pulmonary effort is normal.     Breath sounds: Normal breath sounds.  Abdominal:     General: Bowel sounds are normal.     Palpations: Abdomen is soft.     Tenderness: There is no abdominal tenderness.  Skin:    General: Skin is warm  and dry.  Neurological:     Mental Status: She is alert and oriented to person, place, and time.     BP 99/67 (BP Location: Left Arm, Patient Position: Sitting, Cuff Size: Normal)   Pulse 70   Temp 97.7 F (36.5 C) (Oral)   Resp 18   Ht '5\' 1"'  (1.549 m)   Wt 178 lb (80.7 kg)   LMP 01/30/1994   SpO2 100%   BMI 33.63 kg/m  Wt Readings from Last 3 Encounters:  02/04/18 178 lb (80.7 kg)  12/25/17 181 lb (82.1 kg)  12/21/17 181 lb (82.1 kg)     Lab Results  Component Value Date   WBC 6.9 02/04/2018   HGB 12.7 02/04/2018   HCT 38.6 02/04/2018   PLT 230.0 02/04/2018   GLUCOSE 99 02/04/2018   CHOL 174 02/04/2018   TRIG 275.0 (H) 02/04/2018   HDL 41.60 02/04/2018   LDLDIRECT 73.0 02/04/2018   LDLCALC 75 06/14/2017   ALT 19 02/04/2018   AST 20 02/04/2018   NA 145 02/04/2018   K 5.1 02/04/2018   CL 107 02/04/2018   CREATININE 0.82 02/04/2018   BUN 12 02/04/2018   CO2 30 02/04/2018   TSH 1.98 02/04/2018   INR 1.06 03/30/2016   HGBA1C 5.7 02/04/2018    Lab Results  Component Value Date   TSH 1.98 02/04/2018   Lab Results  Component Value Date   WBC 6.9 02/04/2018   HGB 12.7 02/04/2018   HCT 38.6 02/04/2018   MCV 88.9 02/04/2018   PLT 230.0 02/04/2018   Lab Results  Component Value Date   NA 145 02/04/2018   K 5.1 02/04/2018   CHLORIDE 107 08/07/2016   CO2 30 02/04/2018   GLUCOSE 99 02/04/2018   BUN 12 02/04/2018   CREATININE 0.82 02/04/2018   BILITOT 0.4 02/04/2018   ALKPHOS 91 02/04/2018   AST 20 02/04/2018   ALT 19 02/04/2018   PROT 6.2 02/04/2018   ALBUMIN 4.0 02/04/2018   CALCIUM 9.6 02/04/2018   ANIONGAP 8 12/14/2017   EGFR 72 (L) 08/07/2016   GFR 72.50 02/04/2018   Lab Results  Component Value Date   CHOL 174 02/04/2018   Lab Results  Component Value Date   HDL 41.60 02/04/2018   Lab Results  Component Value Date   LDLCALC 75 06/14/2017   Lab Results  Component Value Date   TRIG 275.0 (H) 02/04/2018   Lab Results  Component  Value Date   CHOLHDL 4 02/04/2018   Lab Results  Component Value Date   HGBA1C 5.7 02/04/2018       Assessment & Plan:   Problem List Items Addressed This Visit    Hyperlipidemia, mixed   Relevant Medications   telmisartan (MICARDIS) 80 MG tablet   Other Relevant Orders   Lipid panel (Completed)   Essential hypertension    Well controlled, no changes to meds. Encouraged heart healthy diet such as the DASH diet and exercise as tolerated.  Relevant Medications   telmisartan (MICARDIS) 80 MG tablet   Other Relevant Orders   CBC (Completed)   Comprehensive metabolic panel (Completed)   TSH (Completed)   Osteoporosis    Encouraged to get adequate exercise, calcium and vitamin d intake      Relevant Orders   VITAMIN D 25 Hydroxy (Vit-D Deficiency, Fractures) (Completed)   Prediabetes    hgba1c acceptable, minimize simple carbs. Increase exercise as tolerated.       Relevant Orders   Hemoglobin A1c (Completed)   IC (interstitial cystitis) - Primary   Relevant Orders   Urinalysis (Completed)   Urine Culture (Completed)   UTI (urinary tract infection)   Relevant Orders   Urinalysis (Completed)   Urine Culture (Completed)   Interstitial cystitis    Still struggles with dysuria at times. Will proceed with UA and culture         I have changed Alycia Patten. Flippo's telmisartan. I am also having her maintain her hydrOXYzine, pentosan polysulfate, B Complex-C (SUPER B COMPLEX PO), acetaminophen, ferrous sulfate, multivitamin with minerals, carvedilol, nystatin, aspirin EC, gabapentin, clonazePAM, venlafaxine XR, rosuvastatin, and folic acid.  Meds ordered this encounter  Medications  . telmisartan (MICARDIS) 80 MG tablet    Sig: Take 0.5 tablets (40 mg total) by mouth at bedtime.     Penni Homans, MD

## 2018-02-06 NOTE — Assessment & Plan Note (Signed)
Encouraged to get adequate exercise, calcium and vitamin d intake 

## 2018-02-12 ENCOUNTER — Encounter: Payer: Self-pay | Admitting: Physical Therapy

## 2018-02-12 ENCOUNTER — Ambulatory Visit: Payer: Medicare Other | Admitting: Physical Therapy

## 2018-02-12 DIAGNOSIS — R293 Abnormal posture: Secondary | ICD-10-CM | POA: Diagnosis not present

## 2018-02-12 DIAGNOSIS — R262 Difficulty in walking, not elsewhere classified: Secondary | ICD-10-CM | POA: Diagnosis not present

## 2018-02-12 DIAGNOSIS — G8929 Other chronic pain: Secondary | ICD-10-CM

## 2018-02-12 DIAGNOSIS — M545 Low back pain, unspecified: Secondary | ICD-10-CM

## 2018-02-12 DIAGNOSIS — M6281 Muscle weakness (generalized): Secondary | ICD-10-CM

## 2018-02-12 NOTE — Therapy (Signed)
[facility] [address]  [place], [place], [address] Phone: [phone]   Fax:  [phone]  Physical Therapy Treatment  Patient Details  Name: Shannon [name] MRN: [id number] Date of Birth: [birth year] Referring Provider (PT): Penni Homans, MD   Progress Note Reporting Period 01/03/18 to 02/12/18  See note below for Objective Data and Assessment of Progress/Goals.    Encounter Date: 02/12/2018  PT End of Session - 02/12/18 1413    Visit Number  7    Number of Visits  13    Date for PT Re-Evaluation  02/14/18    Authorization Type  Medicare & BCBS    PT Start Time  4580    PT Stop Time  1408    PT Time Calculation (min)  51 min    Activity Tolerance  Patient tolerated treatment well    Behavior During Therapy  WFL for tasks assessed/performed       Past Medical History:  Diagnosis Date  . 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 Anemia 08/28/2011  . Asthma    as a child  . Brain tumor ([place])   . Breast cancer ([place])    right breast  . Carpal tunnel syndrome of left wrist   . Cataracts, bilateral 12/28/2015  . Constipation 03/03/2012  . DEPRESSION 08/11/2008  . Dermatitis   . Facial fracture ([facility])   . GERD 08/11/2008  . H/O measles   . H/O mumps   . History of blood transfusion   . History of chicken pox   . History of hiatal hernia   . 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 HYPERLIPIDEMIA 08/11/2008  . HYPERTENSION 08/11/2008  . Hyponatremia 08/28/2011  . 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 Interstitial cystitis   . LIPOMA 02/10/2009  . Medicare annual wellness visit, subsequent 12/27/2014   Follows with Dr Amalia Hailey of urology Follows with Dr Posey Pronto at [facility] for opthamology Follows with Dr [name] of dermatology Follows with [facility] gastroenterology, last scope done in 2011 repeat in 10 years Last [facility] July 2015 should repeat  every 1-2 years Last Pap December 2015, repeat in 3 years.   . Melanoma ([place]) 2008   DR. Ronnald Ramp  . Neuropathy   . NICM (nonischemic cardiomyopathy) ([place]) 03/08/2010   likely 2/2 chemotx - a. Echo 2012: EF 45-50%;  b. Lex MV 2/12:  low risk, apical defect (small area of ischemia vs shifting breast atten);  c.  Echo 7/12: Normal wall thickness, EF 60-65%, normal wall motion, grade 1 diastolic dysfunction, mild LAE, PASP 32;   d. Lex MV 11/13:  EF 76%, no ischemia  . OA (osteoarthritis) 09/07/2011  . Obesity 09/28/2014  . Osteoporosis   . Personal history of chemotherapy 2012  . Personal history of radiation therapy 2012  . PREDIABETES 06/15/2009  . Recurrent falls 12/28/2015  . Shortness of breath   . UTI (lower urinary tract infection) 03/03/2012  . Vertebral fracture, osteoporotic, sequela 04/05/2016    Past Surgical History:  Procedure Laterality Date  . BREAST LUMPECTOMY  04/2009   RIGHT FOR BREAST CANCER-CHEMO/RADIATION X 1 YEAR  . BREAST REDUCTION SURGERY Bilateral 05/04/2014   Procedure: MAMMARY REDUCTION  (BREAST);  Surgeon: Cristine Polio, MD;  Location: [facility];  Service: Plastics;  Laterality: Bilateral;  . CARPAL TUNNEL RELEASE     L  wrist, ulnar nerve moved  . CHOLECYSTECTOMY    . CRANIOTOMY FOR TUMOR  2000  . ELBOW SURGERY     left  . KNEE ARTHROSCOPY Left 10/12/2014   Procedure: LEFT KNEE ARTHROSCOPY ;  Surgeon: Gaynelle Arabian, MD;  Location: [facility];  Service: Orthopedics;  Laterality: Left;  . KYPHOPLASTY N/A 03/30/2016   Procedure: THORACIC 12 KYPHOPLASTY;  Surgeon: Phylliss Bob, MD;  Location: [facility];  Service: Orthopedics;  Laterality: N/A;  THORACIC 12 KYPHOPLASTY  . LEFT HEART CATHETERIZATION WITH CORONARY ANGIOGRAM N/A 12/16/2013   Procedure: LEFT HEART CATHETERIZATION WITH CORONARY ANGIOGRAM;  Surgeon: Peter M [name], MD;  Location: [facility];  Service: Cardiovascular;  Laterality: N/A;  . LIPOMA EXCISION  03/28/2009   right leg  . MELANOMA EXCISION     . PORT-A-CATH REMOVAL  11/30/2010   Streck  . porta cath    . PORTACATH PLACEMENT  may 2011  . REDUCTION MAMMAPLASTY Bilateral   . SYNOVECTOMY Left 10/12/2014   Procedure: WITH SYNOVECTOMY;  Surgeon: Gaynelle Arabian, MD;  Location: [facility];  Service: Orthopedics;  Laterality: Left;  . TONSILLECTOMY  1958  . TOTAL KNEE ARTHROPLASTY  02/05/2012   Procedure: TOTAL KNEE ARTHROPLASTY;  Surgeon: Gearlean Alf, MD;  Location: [facility];  Service: Orthopedics;  Laterality: Right;  . TOTAL KNEE ARTHROPLASTY Left 02/10/2013   Procedure: LEFT TOTAL KNEE ARTHROPLASTY;  Surgeon: Gearlean Alf, MD;  Location: [facility];  Service: Orthopedics;  Laterality: Left;  . total knee raplacement  01-2012   Right Knee  . TUBAL LIGATION  1997    There were no vitals filed for this visit.  Subjective Assessment - 02/12/18 1319    Subjective  Reports that last Tuesday she had dizziness and "inner ear problems" which resulted in a fall where she fell on both knees and hit her low back on a piece of furniture. Fell against a bed once standing up. Reports no lasting injury or hitting her head. Reports that LB is still alittle sore. DIzziness has fully resolved. Reports 50% improvement since initial eval. Notes improvement in stamina, being more careful about body mechnaics, and pain level.     Pertinent History  osteoporotic vertebral fx 2018, T12 kyphoplasty, recurrent falls, prediabetes, nonischemic cardiomyopathy, neuropathy, melanoma, HTN, HLD, breast CA with chemo and radiation, GERD, facial fx, L carpal tunnel release, brain tumor with craniotomy, asthma, anemia, B TKA, L heart cath 2015    Diagnostic tests  per patient- MD did xray but not sure of results    Patient Stated Goals  "that my back feels better"    Currently in Pain?  Yes    Pain Score  2     Pain Location  Back    Pain Orientation  Right;Left;Lower    Pain Descriptors / Indicators  Dull    Pain Type  Chronic pain         [facility] PT Assessment -  02/12/18 0001      Strength   Right Hip Flexion  4+/5    Right Hip ABduction  4+/5    Right Hip ADduction  4+/5    Left Hip Flexion  4+/5    Left Hip ABduction  4+/5    Left Hip ADduction  4+/5    Right Knee Flexion  5/5   pain in knee   Right Knee Extension  5/5    Left Knee Flexion  4+/5   pain in knee   Left Knee Extension  5/5  Right Ankle Dorsiflexion  4+/5    Right Ankle Plantar Flexion  4+/5    Left Ankle Dorsiflexion  4+/5    Left Ankle Plantar Flexion  4+/5      Dynamic Gait Index   Level Surface  Normal    Change in Gait Speed  Normal    Gait with Horizontal Head Turns  Normal    Gait with Vertical Head Turns  Mild Impairment    Gait and Pivot Turn  Normal    Step Over Obstacle  Normal    Step Around Obstacles  Mild Impairment    Steps  Mild Impairment    Total Score  21                   [facility] Adult PT Treatment/Exercise - 02/12/18 0001      Therapeutic Activites    Therapeutic Activities  Lifting    Lifting  practice and edu on lifting 10# box off ground; simulation of transfering a person from a W/C while maintaining good body mechnaics      Lumbar Exercises: Standing   Row  Strengthening;Both;10 reps;Theraband    Theraband Level (Row)  Level 3 (Green)    Row Limitations  cues for wider stance      Knee/Hip Exercises: Standing   Hip Flexion  Stengthening;Both;1 set;20 reps;Knee bent    Hip Flexion Limitations  at counter top with red TB around toes    Forward Step Up  Right;Left;1 set;5 reps;Hand Hold: 1;Step Height: 6"    Forward Step Up Limitations  quick review of slow descent              PT Education - 02/12/18 1412    Education Details  update and consolidation of HEP, discussion of objective progress thus far, edu on vestibular rehab and its benefits     Person(s) Educated  Patient    Methods  Explanation;Demonstration;Tactile cues;Verbal cues;Handout    Comprehension  Verbalized understanding;Returned demonstration        PT Short Term Goals - 02/12/18 1324      PT SHORT TERM GOAL #1   Title  Patient to be independent with initial HEP.    Time  3    Period  Weeks    Status  Achieved        PT Long Term Goals - 02/12/18 1324      PT LONG TERM GOAL #1   Title  Patient to be independent with advanced HEP.    Time  6    Period  Weeks    Status  Achieved      PT LONG TERM GOAL #2   Title  Patient to demonstrate >=4+/5 strength in B LEs.    Time  6    Period  Weeks    Status  Achieved   improvements demonstrated in L hip flexion, abduction, and adduction, L knee flexion, B plantarflexion, and R dorsiflexion strength     PT LONG TERM GOAL #3   Title  Patient to demonstrate proper body mechanics and report no pain when lifting 20# box.    Time  6    Period  Weeks    Status  Partially Met   able to lift 10lb box with cueing to maintain neutral spine     PT LONG TERM GOAL #4   Title  Patient to report 70% improvement in pain levels when transfering from STS and supine to sit.     Time  6    Period  Weeks    Status  Partially Met   reports 40% improvement     PT LONG TERM GOAL #5   Title  Patient to score >19/24 on DGI to decrease risk of falls.     Time  6    Period  Weeks    Status  Achieved   scored 21/24 today           Plan - 02/12/18 1421    Clinical Impression Statement  Patient arrived to session with report of 50 % improvement since initial eval, notes that she has improved in pain levels, stamina, and body mechanics with lifting. Patient did report an episode of "inner ear problems" last Tuesday, lasting for a couple days that resulted in a fall. Denies hitting head or severe lasting injury, but notes mild LB soreness. Advised patient to follow up with her MD about this dizziness in order to prevent further injury, as she mentions that she has had these episodes in the past. Patient has met or partially met all goals at this time. Improvements demonstrated in L hip flexion,  abduction, and adduction, L knee flexion, B plantarflexion, and R dorsiflexion strength. Patient was able to lift 10lb box with cueing to maintain neutral spine without c/o pain. Notes 40% improvement in pain levels with transitory movements. Scored 21/24 on DGI today, indicated decreased risk of falls. Took time to update and consolidate HEP for continued fitness regimen. Patient reports that she would like to transition to HEP at this time d/t satisfaction with progress. Placing patient on 30 day hold at this time.     PT Treatment/Interventions  ADLs/Self Care Home Management;Cryotherapy;Electrical Stimulation;Moist Heat;Ultrasound;DME Instruction;Gait training;Stair training;Functional mobility training;Therapeutic activities;Therapeutic exercise;Manual techniques;Patient/family education;Neuromuscular re-education;Balance training;Passive range of motion;Dry needling;Energy conservation;Splinting;Taping    PT Next Visit Plan  30 day hold at this time    Consulted and Agree with Plan of Care  Patient       Patient will benefit from skilled therapeutic intervention in order to improve the following deficits and impairments:  Decreased endurance, Decreased activity tolerance, Decreased strength, Pain  Visit Diagnosis: Chronic bilateral low back pain without sciatica  Muscle weakness (generalized)  Abnormal posture  Difficulty in walking, not elsewhere classified     Problem List Patient Active Problem List   Diagnosis Date Noted  . Other headache syndrome 12/23/2017  . Debility 12/23/2017  . Left knee pain 12/23/2017  . Closed fracture of maxilla, initial encounter ([place]) 10/17/2017  . Vitamin D deficiency 06/14/2017  . Vertebral fracture, osteoporotic, sequela 04/05/2016  . Left-sided back pain 03/06/2016  . Chest pain 02/27/2016  . Syncope 02/27/2016  . Cataracts, bilateral 12/28/2015  . Recurrent falls 12/28/2015  . Genetic testing 11/29/2015  . Family history- stomach cancer  09/08/2015  . Family history of cancer 09/08/2015  . [name] lesion 06/21/2015  . Left leg swelling 05/24/2015  . Medicare annual wellness visit, subsequent 12/27/2014  . 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 Obesity 09/28/2014  . Interstitial cystitis 09/28/2014  . Pain in joint, ankle and foot 09/28/2014  . Abnormal nuclear stress test 12/16/2013  . Sternal pain 11/06/2013  . Postoperative anemia due to acute blood loss 02/13/2013  . Preop cardiovascular exam 12/30/2012  . 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 Breast cancer of lower-inner quadrant of right female breast ([place]) 10/21/2012  . Insomnia 10/18/2012  . UTI (urinary tract infection) 03/03/2012  . Constipation 03/03/2012  .  OA (osteoarthritis) 09/07/2011  . Dermatitis   . Hyponatremia 08/28/2011  . Osteoporosis 03/07/2011  . IC (interstitial cystitis)   . Melanoma ([place])   . Craniopharyngioma ([place]) 03/25/2010  . Prediabetes 06/15/2009  . LIPOMA 02/10/2009  . Dyspnea on exertion 02/10/2009  . Hyperlipidemia, mixed 08/11/2008  . Depression with anxiety 08/11/2008  . Essential hypertension 08/11/2008     Janene Harvey, PT, DPT 02/12/18 2:24 PM   [facility] [address]  [place], [place], [address] Phone: [phone]   Fax:  [phone]  Name: SHARMAINE BAIN MRN: [id number] Date of Birth: 03/01/1945

## 2018-02-18 DIAGNOSIS — H2513 Age-related nuclear cataract, bilateral: Secondary | ICD-10-CM | POA: Diagnosis not present

## 2018-02-21 DIAGNOSIS — L565 Disseminated superficial actinic porokeratosis (DSAP): Secondary | ICD-10-CM | POA: Diagnosis not present

## 2018-02-21 DIAGNOSIS — L218 Other seborrheic dermatitis: Secondary | ICD-10-CM | POA: Diagnosis not present

## 2018-02-21 DIAGNOSIS — L900 Lichen sclerosus et atrophicus: Secondary | ICD-10-CM | POA: Diagnosis not present

## 2018-02-21 DIAGNOSIS — D1801 Hemangioma of skin and subcutaneous tissue: Secondary | ICD-10-CM | POA: Diagnosis not present

## 2018-02-21 DIAGNOSIS — L821 Other seborrheic keratosis: Secondary | ICD-10-CM | POA: Diagnosis not present

## 2018-02-28 ENCOUNTER — Ambulatory Visit (INDEPENDENT_AMBULATORY_CARE_PROVIDER_SITE_OTHER): Payer: Medicare Other | Admitting: Psychology

## 2018-02-28 DIAGNOSIS — F4323 Adjustment disorder with mixed anxiety and depressed mood: Secondary | ICD-10-CM | POA: Diagnosis not present

## 2018-03-22 ENCOUNTER — Ambulatory Visit: Payer: Self-pay | Admitting: Psychology

## 2018-03-25 ENCOUNTER — Other Ambulatory Visit: Payer: Self-pay | Admitting: Family Medicine

## 2018-03-26 NOTE — Telephone Encounter (Signed)
Requesting:klonopin  Contract:yes UDS:low risk next screen 03/31/18 Last OV:02/04/18 Next OV:04/05/18 Last Refill:10/16/17  #[id number] Database:   Please advise

## 2018-04-04 ENCOUNTER — Ambulatory Visit: Payer: Medicare Other | Admitting: Psychology

## 2018-04-05 ENCOUNTER — Ambulatory Visit (HOSPITAL_BASED_OUTPATIENT_CLINIC_OR_DEPARTMENT_OTHER)
Admission: RE | Admit: 2018-04-05 | Discharge: 2018-04-05 | Disposition: A | Discharge status: Home / Self Care | Payer: Medicare Other | Source: Ambulatory Visit | Attending: Family Medicine | Admitting: Family Medicine

## 2018-04-05 ENCOUNTER — Ambulatory Visit (INDEPENDENT_AMBULATORY_CARE_PROVIDER_SITE_OTHER): Payer: Medicare Other | Admitting: Family Medicine

## 2018-04-05 ENCOUNTER — Encounter: Payer: Self-pay | Admitting: Family Medicine

## 2018-04-05 VITALS — BP 108/80 | HR 65 | Temp 98.0°F | Resp 18 | Wt 178.0 lb

## 2018-04-05 DIAGNOSIS — K5900 Constipation, unspecified: Secondary | ICD-10-CM

## 2018-04-05 DIAGNOSIS — R7303 Prediabetes: Secondary | ICD-10-CM | POA: Diagnosis not present

## 2018-04-05 DIAGNOSIS — I10 Essential (primary) hypertension: Secondary | ICD-10-CM

## 2018-04-05 DIAGNOSIS — E782 Mixed hyperlipidemia: Secondary | ICD-10-CM

## 2018-04-05 DIAGNOSIS — M545 Low back pain, unspecified: Secondary | ICD-10-CM

## 2018-04-05 DIAGNOSIS — E559 Vitamin D deficiency, unspecified: Secondary | ICD-10-CM

## 2018-04-05 DIAGNOSIS — G8929 Other chronic pain: Secondary | ICD-10-CM

## 2018-04-05 NOTE — Patient Instructions (Addendum)
Systolic [id number] take meds unless you feel light headed go ahead and take meds as long as Systolic is >948  Diastolic 54-62  Encouraged increased hydration and fiber in diet. Daily probiotics. If bowels not moving can use MOM 2 tbls po in 4 oz of warm prune juice by mouth every 2-3 days. If no results then repeat in 4 hours with  Dulcolax suppository pr, may repeat again in 4 more hours as needed. Seek care if symptoms worsen. Consider daily Miralax and/or Dulcolax if symptoms persist.  mIralax and benefiber once to three times daily as needed. Can use Benefiber alone if too loose  Hypertension Hypertension, commonly called high blood pressure, is when the force of blood pumping through the arteries is too strong. The arteries are the blood vessels that carry blood from the heart throughout the body. Hypertension forces the heart to work harder to pump blood and may cause arteries to become narrow or stiff. Having untreated or uncontrolled hypertension can cause heart attacks, strokes, kidney disease, and other problems. A blood pressure reading consists of a higher number over a lower number. Ideally, your blood pressure should be below 120/80. The first ("top") number is called the systolic pressure. It is a measure of the pressure in your arteries as your heart beats. The second ("bottom") number is called the diastolic pressure. It is a measure of the pressure in your arteries as the heart relaxes. What are the causes? The cause of this condition is not known. What increases the risk? Some risk factors for high blood pressure are under your control. Others are not. Factors you can change  Smoking.  Having type 2 diabetes mellitus, high cholesterol, or both.  Not getting enough exercise or physical activity.  Being overweight.  Having too much fat, sugar, calories, or salt (sodium) in your diet.  Drinking too much alcohol. Factors that are difficult or impossible to change  Having  chronic kidney disease.  Having a family history of high blood pressure.  Age. Risk increases with age.  Race. You may be at higher risk if you are African-American.  Gender. Men are at higher risk than women before age 38. After age 40, women are at higher risk than men.  Having obstructive sleep apnea.  Stress. What are the signs or symptoms? Extremely high blood pressure (hypertensive crisis) may cause:  Headache.  Anxiety.  Shortness of breath.  Nosebleed.  Nausea and vomiting.  Severe chest pain.  Jerky movements you cannot control (seizures). How is this diagnosed? This condition is diagnosed by measuring your blood pressure while you are seated, with your arm resting on a surface. The cuff of the blood pressure monitor will be placed directly against the skin of your upper arm at the level of your heart. It should be measured at least twice using the same arm. Certain conditions can cause a difference in blood pressure between your right and left arms. Certain factors can cause blood pressure readings to be lower or higher than normal (elevated) for a short period of time:  When your blood pressure is higher when you are in a health care provider's office than when you are at home, this is called white coat hypertension. Most people with this condition do not need medicines.  When your blood pressure is higher at home than when you are in a health care provider's office, this is called masked hypertension. Most people with this condition may need medicines to control blood pressure. If you have  a high blood pressure reading during one visit or you have normal blood pressure with other risk factors:  You may be asked to return on a different day to have your blood pressure checked again.  You may be asked to monitor your blood pressure at home for 1 week or longer. If you are diagnosed with hypertension, you may have other blood or imaging tests to help your health care  provider understand your overall risk for other conditions. How is this treated? This condition is treated by making healthy lifestyle changes, such as eating healthy foods, exercising more, and reducing your alcohol intake. Your health care provider may prescribe medicine if lifestyle changes are not enough to get your blood pressure under control, and if:  Your systolic blood pressure is above 130.  Your diastolic blood pressure is above 80. Your personal target blood pressure may vary depending on your medical conditions, your age, and other factors. Follow these instructions at home: Eating and drinking   Eat a diet that is high in fiber and potassium, and low in sodium, added sugar, and fat. An example eating plan is called the DASH (Dietary Approaches to Stop Hypertension) diet. To eat this way: ? Eat plenty of fresh fruits and vegetables. Try to fill half of your plate at each meal with fruits and vegetables. ? Eat whole grains, such as whole wheat pasta, brown rice, or whole grain bread. Fill about one quarter of your plate with whole grains. ? Eat or drink low-fat dairy products, such as skim milk or low-fat yogurt. ? Avoid fatty cuts of meat, processed or cured meats, and poultry with skin. Fill about one quarter of your plate with lean proteins, such as fish, chicken without skin, beans, eggs, and tofu. ? Avoid premade and processed foods. These tend to be higher in sodium, added sugar, and fat.  Reduce your daily sodium intake. Most people with hypertension should eat less than 1,500 mg of sodium a day.  Limit alcohol intake to no more than 1 drink a day for nonpregnant women and 2 drinks a day for men. One drink equals 12 oz of beer, 5 oz of wine, or 1 oz of hard liquor. Lifestyle   Work with your health care provider to maintain a healthy body weight or to lose weight. Ask what an ideal weight is for you.  Get at least 30 minutes of exercise that causes your heart to beat  faster (aerobic exercise) most days of the week. Activities may include walking, swimming, or biking.  Include exercise to strengthen your muscles (resistance exercise), such as pilates or lifting weights, as part of your weekly exercise routine. Try to do these types of exercises for 30 minutes at least 3 days a week.  Do not use any products that contain nicotine or tobacco, such as cigarettes and e-cigarettes. If you need help quitting, ask your health care provider.  Monitor your blood pressure at home as told by your health care provider.  Keep all follow-up visits as told by your health care provider. This is important. Medicines  Take over-the-counter and prescription medicines only as told by your health care provider. Follow directions carefully. Blood pressure medicines must be taken as prescribed.  Do not skip doses of blood pressure medicine. Doing this puts you at risk for problems and can make the medicine less effective.  Ask your health care provider about side effects or reactions to medicines that you should watch for. Contact a health care  provider if:  You think you are having a reaction to a medicine you are taking.  You have headaches that keep coming back (recurring).  You feel dizzy.  You have swelling in your ankles.  You have trouble with your vision. Get help right away if:  You develop a severe headache or confusion.  You have unusual weakness or numbness.  You feel faint.  You have severe pain in your chest or abdomen.  You vomit repeatedly.  You have trouble breathing. Summary  Hypertension is when the force of blood pumping through your arteries is too strong. If this condition is not controlled, it may put you at risk for serious complications.  Your personal target blood pressure may vary depending on your medical conditions, your age, and other factors. For most people, a normal blood pressure is less than 120/80.  Hypertension is  treated with lifestyle changes, medicines, or a combination of both. Lifestyle changes include weight loss, eating a healthy, low-sodium diet, exercising more, and limiting alcohol. This information is not intended to replace advice given to you by your health care provider. Make sure you discuss any questions you have with your health care provider. Document Released: 01/16/2005 Document Revised: 12/15/2015 Document Reviewed: 12/15/2015 Elsevier Interactive Patient Education  2019 [employer].

## 2018-04-05 NOTE — Assessment & Plan Note (Signed)
Encouraged increased hydration and fiber in diet. Daily probiotics. If bowels not moving can use MOM 2 tbls po in 4 oz of warm prune juice by mouth every 2-3 days. If no results then repeat in 4 hours with  Dulcolax suppository pr, may repeat again in 4 more hours as needed. Seek care if symptoms worsen. Consider daily Miralax and/or Dulcolax if symptoms persist.  

## 2018-04-05 NOTE — Assessment & Plan Note (Signed)
Well controlled, no changes to meds. Encouraged heart healthy diet such as the DASH diet and exercise as tolerated.  °

## 2018-04-05 NOTE — Assessment & Plan Note (Signed)
Supplement and monitor 

## 2018-04-05 NOTE — Assessment & Plan Note (Signed)
[id number] acceptable, minimize simple carbs. Increase exercise as tolerated.  

## 2018-04-05 NOTE — Assessment & Plan Note (Signed)
Tolerating statin, encouraged heart healthy diet, avoid trans fats, minimize simple carbs and saturated fats. Increase exercise as tolerated 

## 2018-04-07 NOTE — Progress Notes (Signed)
Subjective:    Patient ID: Shannon [name], female    DOB: May 15, 1945, 73 y.o.   MRN: [id number]  No chief complaint on file.   HPI Patient is in today for follow up. She is still very tired as the care provider of her adult disabled son who requires total care. She hs gotten him qualified for CAP services but they are struggling with finding a worker to help them. She continues to have daily pain in joints and back as she cares for her son. She endorses anxiety and anhedonia but is still not looking for placement for her son. Denies CP/palp/SOB/HA/congestion/fevers/GI or GU c/o. Taking meds as prescribed  Past Medical History:  Diagnosis Date  . 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 Anemia 08/28/2011  . Asthma    as a child  . Brain tumor ([place])   . Breast cancer ([place])    right breast  . Carpal tunnel syndrome of left wrist   . Cataracts, bilateral 12/28/2015  . Constipation 03/03/2012  . DEPRESSION 08/11/2008  . Dermatitis   . Facial fracture ([place])   . GERD 08/11/2008  . H/O measles   . H/O mumps   . History of blood transfusion   . History of chicken pox   . History of hiatal hernia   . 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 HYPERLIPIDEMIA 08/11/2008  . HYPERTENSION 08/11/2008  . Hyponatremia 08/28/2011  . 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 Interstitial cystitis   . LIPOMA 02/10/2009  . Medicare annual wellness visit, subsequent 12/27/2014   Follows with Dr Amalia Hailey of urology Follows with Dr Posey Pronto at [facility] for opthamology Follows with Dr [name] of dermatology Follows with [facility] gastroenterology, last scope done in 2011 repeat in 10 years Last [facility] July 2015 should repeat every 1-2 years Last Pap December 2015, repeat in 3 years.   . Melanoma ([place]) 2008   DR. Ronnald Ramp  . Neuropathy   . NICM (nonischemic cardiomyopathy) ([place]) 03/08/2010   likely 2/2 chemotx - a. Echo 2012: EF 45-50%;  b. Lex MV 2/12:   low risk, apical defect (small area of ischemia vs shifting breast atten);  c.  Echo 7/12: Normal wall thickness, EF 60-65%, normal wall motion, grade 1 diastolic dysfunction, mild LAE, PASP 32;   d. Lex MV 11/13:  EF 76%, no ischemia  . OA (osteoarthritis) 09/07/2011  . Obesity 09/28/2014  . Osteoporosis   . Personal history of chemotherapy 2012  . Personal history of radiation therapy 2012  . PREDIABETES 06/15/2009  . Recurrent falls 12/28/2015  . Shortness of breath   . UTI (lower urinary tract infection) 03/03/2012  . Vertebral fracture, osteoporotic, sequela 04/05/2016    Past Surgical History:  Procedure Laterality Date  . BREAST LUMPECTOMY  04/2009   RIGHT FOR BREAST CANCER-CHEMO/RADIATION X 1 YEAR  . BREAST REDUCTION SURGERY Bilateral 05/04/2014   Procedure: MAMMARY REDUCTION  (BREAST);  Surgeon: Cristine Polio, MD;  Location: [facility];  Service: Plastics;  Laterality: Bilateral;  . CARPAL TUNNEL RELEASE     L wrist, ulnar nerve moved  . CHOLECYSTECTOMY    . CRANIOTOMY FOR TUMOR  2000  . ELBOW SURGERY     left  . KNEE ARTHROSCOPY Left 10/12/2014   Procedure: LEFT KNEE ARTHROSCOPY ;  Surgeon: Gaynelle Arabian, MD;  Location: [facility];  Service: Orthopedics;  Laterality: Left;  .  KYPHOPLASTY N/A 03/30/2016   Procedure: THORACIC 12 KYPHOPLASTY;  Surgeon: Phylliss Bob, MD;  Location: [facility];  Service: Orthopedics;  Laterality: N/A;  THORACIC 12 KYPHOPLASTY  . LEFT HEART CATHETERIZATION WITH CORONARY ANGIOGRAM N/A 12/16/2013   Procedure: LEFT HEART CATHETERIZATION WITH CORONARY ANGIOGRAM;  Surgeon: Peter M [name], MD;  Location: [facility];  Service: Cardiovascular;  Laterality: N/A;  . LIPOMA EXCISION  03/28/2009   right leg  . MELANOMA EXCISION    . PORT-A-CATH REMOVAL  11/30/2010   [place]  . porta cath    . PORTACATH PLACEMENT  may 2011  . REDUCTION MAMMAPLASTY Bilateral   . SYNOVECTOMY Left 10/12/2014   Procedure: WITH SYNOVECTOMY;  Surgeon: Gaynelle Arabian, MD;   Location: [facility];  Service: Orthopedics;  Laterality: Left;  . TONSILLECTOMY  1958  . TOTAL KNEE ARTHROPLASTY  02/05/2012   Procedure: TOTAL KNEE ARTHROPLASTY;  Surgeon: Gearlean Alf, MD;  Location: [facility];  Service: Orthopedics;  Laterality: Right;  . TOTAL KNEE ARTHROPLASTY Left 02/10/2013   Procedure: LEFT TOTAL KNEE ARTHROPLASTY;  Surgeon: Gearlean Alf, MD;  Location: [facility];  Service: Orthopedics;  Laterality: Left;  . total knee raplacement  01-2012   Right Knee  . TUBAL LIGATION  1997    Family History  Problem Relation Age of Onset  . Breast cancer Mother        sarcoma  . Lung cancer Mother   . Hypertension Mother   . Prostate cancer Father   . Congestive Heart Failure Father   . Heart attack Father   . Prostate cancer Brother   . Down syndrome Son   . CVA Son   . Heart disease Maternal Grandfather        MI  . Stomach cancer Maternal Aunt   . Uterine cancer Maternal Aunt   . Colon cancer Neg Hx     Social History   Socioeconomic History  . Marital status: Married    Spouse name: Deidre Ala  . Number of children: 3  . Years of education: Not on file  . Highest education level: Not on file  Occupational History  . Occupation: Copywriter, advertising: [employer]  Social Needs  . Financial resource strain: Not on file  . Food insecurity:    Worry: Not on file    Inability: Not on file  . Transportation needs:    Medical: Not on file    Non-medical: Not on file  Tobacco Use  . Smoking status: Never Smoker  . Smokeless tobacco: Never Used  Substance and Sexual Activity  . Alcohol use: No  . Drug use: No  . Sexual activity: Never  Lifestyle  . Physical activity:    Days per week: Not on file    Minutes per session: Not on file  . Stress: Not on file  Relationships  . Social connections:    Talks on phone: Not on file    Gets together: Not on file    Attends religious service: Not on file    Active member of club or organization: Not on file      Attends meetings of clubs or organizations: Not on file    Relationship status: Not on file  . Intimate partner violence:    Fear of current or ex partner: Not on file    Emotionally abused: Not on file    Physically abused: Not on file    Forced sexual activity: Not on file  Other Topics  Concern  . Not on file  Social History Narrative   Patient is right-handed. She lives with her husband in a 4 story home. They take care of their grown son with down syndrome who has had a stroke. She drinks 1-2 glasses of tea in the evenings. She does not formally exercise.    Outpatient Medications Prior to Visit  Medication Sig Dispense Refill  . acetaminophen (TYLENOL) 500 MG tablet Take 500-1,000 mg by mouth every 6 (six) hours as needed for moderate pain or headache.     Marland Kitchen aspirin EC 81 MG tablet Take 81 mg by mouth daily.    . B Complex-C (SUPER B COMPLEX PO) Take 1 tablet by mouth daily.    . carvedilol (COREG) 25 MG tablet Take 0.5 tablets (12.5 mg total) by mouth 2 (two) times daily with a meal. Hold if your blood pressure is <120/80 30 tablet 0  . clonazePAM (KLONOPIN) 0.5 MG tablet TAKE 1/2 TO 1 TABLET BY MOUTH 3 TIMES A DAY AS NEEDED FOR ANXIETY 60 tablet 2  . ferrous sulfate 325 (65 FE) MG tablet Take 325 mg by mouth daily with breakfast.    . folic acid (FOLVITE) 1 MG tablet TAKE 1 TABLET BY MOUTH EVERY DAY 90 tablet 3  . gabapentin (NEURONTIN) 300 MG capsule TAKE 1 CAPSULE BY MOUTH TWICE A DAY (Patient taking differently: daily. ) 180 capsule 1  . hydrOXYzine (ATARAX/VISTARIL) 25 MG tablet Take 25 mg by mouth at bedtime.    . Multiple Vitamin (MULTIVITAMIN WITH MINERALS) TABS tablet Take 1 tablet by mouth daily.    Marland Kitchen nystatin (MYCOSTATIN/NYSTOP) powder Apply topically 4 (four) times daily. 15 g 0  . pentosan polysulfate (ELMIRON) 100 MG capsule Take 100 mg by mouth 2 (two) times daily.     . rosuvastatin (CRESTOR) 20 MG tablet TAKE 1 TABLET BY MOUTH EVERYDAY AT BEDTIME 90 tablet 3  .  telmisartan (MICARDIS) 80 MG tablet Take 0.5 tablets (40 mg total) by mouth at bedtime.    Marland Kitchen venlafaxine XR (EFFEXOR-XR) 75 MG 24 hr capsule Take 2 capsules (150 mg total) by mouth daily with breakfast. 180 capsule 2   No facility-administered medications prior to visit.     Allergies  Allergen Reactions  . Dilaudid [Hydromorphone Hcl] Itching    Review of Systems  Constitutional: Positive for malaise/fatigue. Negative for fever.  HENT: Negative for congestion.   Eyes: Negative for blurred vision.  Respiratory: Negative for shortness of breath.   Cardiovascular: Negative for chest pain, palpitations and leg swelling.  Gastrointestinal: Negative for abdominal pain, blood in stool and nausea.  Genitourinary: Negative for dysuria and frequency.  Musculoskeletal: Positive for back pain and joint pain. Negative for falls.  Skin: Negative for rash.  Neurological: Negative for dizziness, loss of consciousness and headaches.  Endo/Heme/Allergies: Negative for environmental allergies.  Psychiatric/Behavioral: Positive for depression. The patient is nervous/anxious.        Objective:    Physical Exam Vitals signs and nursing note reviewed.  Constitutional:      General: She is not in acute distress.    Appearance: She is well-developed.  HENT:     Head: Normocephalic and atraumatic.     Nose: Nose normal.  Eyes:     General:        Right eye: No discharge.        Left eye: No discharge.  Neck:     Musculoskeletal: Normal range of motion and neck supple.  Cardiovascular:  Rate and Rhythm: Normal rate and regular rhythm.     Heart sounds: No murmur.  Pulmonary:     Effort: Pulmonary effort is normal.     Breath sounds: Normal breath sounds.  Abdominal:     General: Bowel sounds are normal.     Palpations: Abdomen is soft.     Tenderness: There is no abdominal tenderness.  Skin:    General: Skin is warm and dry.  Neurological:     Mental Status: She is alert and oriented  to person, place, and time.     BP 108/80 (BP Location: Left Arm, Patient Position: Sitting, Cuff Size: Normal)   Pulse 65   Temp 98 F (36.7 C) (Oral)   Resp 18   Wt 178 lb (80.7 kg)   LMP 01/30/1994   SpO2 95%   BMI 33.63 kg/m  Wt Readings from Last 3 Encounters:  04/05/18 178 lb (80.7 kg)  02/04/18 178 lb (80.7 kg)  12/25/17 181 lb (82.1 kg)     Lab Results  Component Value Date   WBC 6.9 02/04/2018   HGB 12.7 02/04/2018   HCT 38.6 02/04/2018   PLT 230.0 02/04/2018   GLUCOSE 99 02/04/2018   CHOL 174 02/04/2018   TRIG 275.0 (H) 02/04/2018   HDL 41.60 02/04/2018   LDLDIRECT 73.0 02/04/2018   LDLCALC 75 06/14/2017   ALT 19 02/04/2018   AST 20 02/04/2018   NA 145 02/04/2018   K 5.1 02/04/2018   CL 107 02/04/2018   CREATININE 0.82 02/04/2018   BUN 12 02/04/2018   CO2 30 02/04/2018   TSH 1.98 02/04/2018   INR 1.06 03/30/2016   HGBA1C 5.7 02/04/2018    Lab Results  Component Value Date   TSH 1.98 02/04/2018   Lab Results  Component Value Date   WBC 6.9 02/04/2018   HGB 12.7 02/04/2018   HCT 38.6 02/04/2018   MCV 88.9 02/04/2018   PLT 230.0 02/04/2018   Lab Results  Component Value Date   NA 145 02/04/2018   K 5.1 02/04/2018   CHLORIDE 107 08/07/2016   CO2 30 02/04/2018   GLUCOSE 99 02/04/2018   BUN 12 02/04/2018   CREATININE 0.82 02/04/2018   BILITOT 0.4 02/04/2018   ALKPHOS 91 02/04/2018   AST 20 02/04/2018   ALT 19 02/04/2018   PROT 6.2 02/04/2018   ALBUMIN 4.0 02/04/2018   CALCIUM 9.6 02/04/2018   ANIONGAP 8 12/14/2017   EGFR 72 (L) 08/07/2016   GFR 72.50 02/04/2018   Lab Results  Component Value Date   CHOL 174 02/04/2018   Lab Results  Component Value Date   HDL 41.60 02/04/2018   Lab Results  Component Value Date   LDLCALC 75 06/14/2017   Lab Results  Component Value Date   TRIG 275.0 (H) 02/04/2018   Lab Results  Component Value Date   CHOLHDL 4 02/04/2018   Lab Results  Component Value Date   HGBA1C 5.7 02/04/2018        Assessment & Plan:   Problem List Items Addressed This Visit    Hyperlipidemia, mixed    Tolerating statin, encouraged heart healthy diet, avoid trans fats, minimize simple carbs and saturated fats. Increase exercise as tolerated      Relevant Orders   Lipid panel   Essential hypertension    Well controlled, no changes to meds. Encouraged heart healthy diet such as the DASH diet and exercise as tolerated.       Relevant Orders  CBC   Comprehensive metabolic panel   TSH   Prediabetes    hgba1c acceptable, minimize simple carbs. Increase exercise as tolerated.      Relevant Orders   Hemoglobin A1c   Constipation    Encouraged increased hydration and fiber in diet. Daily probiotics. If bowels not moving can use MOM 2 tbls po in 4 oz of warm prune juice by mouth every 2-3 days. If no results then repeat in 4 hours with  Dulcolax suppository pr, may repeat again in 4 more hours as needed. Seek care if symptoms worsen. Consider daily Miralax and/or Dulcolax if symptoms persist.       Vitamin D deficiency    Supplement and monitor      Relevant Orders   VITAMIN D 25 Hydroxy (Vit-D Deficiency, Fractures)    Other Visit Diagnoses    Chronic low back pain, unspecified back pain laterality, unspecified whether sciatica present    -  Primary   Relevant Orders   DG Lumbar Spine Complete (Completed)      I am having Alycia Patten. Zangara maintain her hydrOXYzine, pentosan polysulfate, B Complex-C (SUPER B COMPLEX PO), acetaminophen, ferrous sulfate, multivitamin with minerals, carvedilol, nystatin, aspirin EC, gabapentin, venlafaxine XR, rosuvastatin, folic acid, telmisartan, and clonazePAM.  No orders of the defined types were placed in this encounter.    Penni Homans, MD

## 2018-06-05 ENCOUNTER — Other Ambulatory Visit: Payer: Self-pay

## 2018-06-05 ENCOUNTER — Other Ambulatory Visit (INDEPENDENT_AMBULATORY_CARE_PROVIDER_SITE_OTHER): Payer: Medicare Other

## 2018-06-05 DIAGNOSIS — I10 Essential (primary) hypertension: Secondary | ICD-10-CM

## 2018-06-05 DIAGNOSIS — E559 Vitamin D deficiency, unspecified: Secondary | ICD-10-CM

## 2018-06-05 DIAGNOSIS — E782 Mixed hyperlipidemia: Secondary | ICD-10-CM | POA: Diagnosis not present

## 2018-06-05 DIAGNOSIS — R7303 Prediabetes: Secondary | ICD-10-CM

## 2018-06-05 LAB — LIPID PANEL
Cholesterol: 160 mg/dL (ref 0–200)
HDL: 43.4 mg/dL (ref >39.00)
LDL Cholesterol: 80 mg/dL (ref 0–99)
NonHDL: 116.44
Total CHOL/HDL Ratio: 4
Triglycerides: 183 mg/dL — ABNORMAL HIGH (ref 0.0–149.0)
VLDL: 36.6 mg/dL (ref 0.0–40.0)

## 2018-06-05 LAB — COMPREHENSIVE METABOLIC PANEL
ALT: 21 U/L (ref 0–35)
AST: 23 U/L (ref 0–37)
Albumin: 4.2 g/dL (ref 3.5–5.2)
Alkaline Phosphatase: 91 U/L (ref 39–117)
BUN: 13 mg/dL (ref 6–23)
CO2: 27 mEq/L (ref 19–32)
Calcium: 9.1 mg/dL (ref 8.4–10.5)
Chloride: 104 mEq/L (ref 96–112)
Creatinine, Ser: 0.79 mg/dL (ref 0.40–1.20)
GFR: 71.14 mL/min (ref >60.00)
Glucose, Bld: 105 mg/dL — ABNORMAL HIGH (ref 70–99)
Potassium: 4.2 mEq/L (ref 3.5–5.1)
Sodium: 139 mEq/L (ref 135–145)
Total Bilirubin: 0.5 mg/dL (ref 0.2–1.2)
Total Protein: 6.5 g/dL (ref 6.0–8.3)

## 2018-06-05 LAB — CBC
HCT: 38.2 % (ref 36.0–46.0)
Hemoglobin: 12.8 g/dL (ref 12.0–15.0)
MCHC: 33.5 g/dL (ref 30.0–36.0)
MCV: 88.6 fl (ref 78.0–100.0)
Platelets: 222 10*3/uL (ref 150.0–400.0)
RBC: 4.31 Mil/uL (ref 3.87–5.11)
RDW: 13.6 % (ref 11.5–15.5)
WBC: 5.8 10*3/uL (ref 4.0–10.5)

## 2018-06-05 LAB — VITAMIN D 25 HYDROXY (VIT D DEFICIENCY, FRACTURES): VITD: 32.91 ng/mL (ref 30.00–100.00)

## 2018-06-05 LAB — HEMOGLOBIN A1C: Hgb A1c MFr Bld: 5.8 % (ref 4.6–6.5)

## 2018-06-05 LAB — TSH: TSH: 2.93 u[IU]/mL (ref 0.35–4.50)

## 2018-06-07 ENCOUNTER — Other Ambulatory Visit: Payer: Self-pay

## 2018-06-07 ENCOUNTER — Ambulatory Visit (INDEPENDENT_AMBULATORY_CARE_PROVIDER_SITE_OTHER): Payer: Medicare Other | Admitting: Family Medicine

## 2018-06-07 DIAGNOSIS — E559 Vitamin D deficiency, unspecified: Secondary | ICD-10-CM

## 2018-06-07 DIAGNOSIS — F418 Other specified anxiety disorders: Secondary | ICD-10-CM | POA: Diagnosis not present

## 2018-06-07 DIAGNOSIS — E782 Mixed hyperlipidemia: Secondary | ICD-10-CM | POA: Diagnosis not present

## 2018-06-07 DIAGNOSIS — G4489 Other headache syndrome: Secondary | ICD-10-CM

## 2018-06-07 DIAGNOSIS — I10 Essential (primary) hypertension: Secondary | ICD-10-CM | POA: Diagnosis not present

## 2018-06-07 MED ORDER — VENLAFAXINE HCL ER 75 MG PO CP24: 150.0000 mg | ORAL_CAPSULE | Freq: Every day | ORAL | 2 refills | Status: DC

## 2018-06-07 MED ORDER — TIZANIDINE HCL 2 MG PO TABS: 1.0000 mg | ORAL_TABLET | Freq: Two times a day (BID) | ORAL | 1 refills | Status: DC | PRN

## 2018-06-07 MED ORDER — CLONAZEPAM 0.5 MG PO TABS: 0.2500 mg | ORAL_TABLET | Freq: Two times a day (BID) | ORAL | 2 refills | Status: DC | PRN

## 2018-06-09 NOTE — Progress Notes (Signed)
Virtual Visit via Video Note  I connected with [name] on 06/09/18 at 10:00 AM EDT by a video enabled telemedicine application and verified that I am speaking with the correct person using two identifiers.  Location: Patient: home Provider: home   I discussed the limitations of evaluation and management by telemedicine and the availability of in person appointments. The patient expressed understanding and agreed to proceed. Shannon [name], CMA was able to get patient set up on video platform.    Subjective:    Patient ID: Shannon [name], female    DOB: [birth year], 73 y.o.   MRN: [id number]  No chief complaint on file.   HPI Patient is in today for follow up on chronic medical concerns including depression, asthma, fatigue, hyperlipidemia and more. No recent febrile illness but she notes she is not sleeping well as her aide for her adult son with special needs has been coming for a month now. No acute concerns but struggles with ongoing headaches and back pain. No trauma. Denies CP/palp/SOB/HA/congestion/fevers/GI or GU c/o. Taking meds as prescribed  Past Medical History:  Diagnosis Date  . 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 Anemia 08/28/2011  . Asthma    as a child  . Brain tumor ([place])   . Breast cancer ([place])    right breast  . Carpal tunnel syndrome of left wrist   . Cataracts, bilateral 12/28/2015  . Constipation 03/03/2012  . DEPRESSION 08/11/2008  . Dermatitis   . Facial fracture ([place])   . GERD 08/11/2008  . H/O measles   . H/O mumps   . History of blood transfusion   . History of chicken pox   . History of hiatal hernia   . 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 HYPERLIPIDEMIA 08/11/2008  . HYPERTENSION 08/11/2008  . Hyponatremia 08/28/2011  . 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 Interstitial cystitis   . LIPOMA 02/10/2009  . Medicare annual wellness visit, subsequent 12/27/2014    Follows with Dr Amalia Hailey of urology Follows with Dr Posey Pronto at [facility] for opthamology Follows with Dr [name] of dermatology Follows with [facility] gastroenterology, last scope done in 2011 repeat in 10 years Last [facility] July 2015 should repeat every 1-2 years Last Pap December 2015, repeat in 3 years.   . Melanoma ([place]) 2008   DR. Ronnald Ramp  . Neuropathy   . NICM (nonischemic cardiomyopathy) ([place]) 03/08/2010   likely 2/2 chemotx - a. Echo 2012: EF 45-50%;  b. Lex MV 2/12:  low risk, apical defect (small area of ischemia vs shifting breast atten);  c.  Echo 7/12: Normal wall thickness, EF 60-65%, normal wall motion, grade 1 diastolic dysfunction, mild LAE, PASP 32;   d. Lex MV 11/13:  EF 76%, no ischemia  . OA (osteoarthritis) 09/07/2011  . Obesity 09/28/2014  . Osteoporosis   . Personal history of chemotherapy 2012  . Personal history of radiation therapy 2012  . PREDIABETES 06/15/2009  . Recurrent falls 12/28/2015  . Shortness of breath   . UTI (lower urinary tract infection) 03/03/2012  . Vertebral fracture, osteoporotic, sequela 04/05/2016    Past Surgical History:  Procedure Laterality Date  . BREAST LUMPECTOMY  04/2009   RIGHT FOR BREAST CANCER-CHEMO/RADIATION X 1 YEAR  . BREAST REDUCTION SURGERY Bilateral 05/04/2014   Procedure: MAMMARY REDUCTION  (BREAST);  Surgeon: Cristine Polio, MD;  Location: [facility];  Service: Clinical cytogeneticist;  Laterality: Bilateral;  . CARPAL TUNNEL RELEASE     L wrist, ulnar nerve moved  . CHOLECYSTECTOMY    . CRANIOTOMY FOR TUMOR  2000  . ELBOW SURGERY     left  . KNEE ARTHROSCOPY Left 10/12/2014   Procedure: LEFT KNEE ARTHROSCOPY ;  Surgeon: Gaynelle Arabian, MD;  Location: [facility];  Service: Orthopedics;  Laterality: Left;  . KYPHOPLASTY N/A 03/30/2016   Procedure: THORACIC 12 KYPHOPLASTY;  Surgeon: Phylliss Bob, MD;  Location: [facility];  Service: Orthopedics;  Laterality: N/A;  THORACIC 12 KYPHOPLASTY  . LEFT HEART CATHETERIZATION WITH CORONARY ANGIOGRAM N/A 12/16/2013    Procedure: LEFT HEART CATHETERIZATION WITH CORONARY ANGIOGRAM;  Surgeon: Peter M [name], MD;  Location: [facility];  Service: Cardiovascular;  Laterality: N/A;  . LIPOMA EXCISION  03/28/2009   right leg  . MELANOMA EXCISION    . PORT-A-CATH REMOVAL  11/30/2010   Streck  . porta cath    . PORTACATH PLACEMENT  may 2011  . REDUCTION MAMMAPLASTY Bilateral   . SYNOVECTOMY Left 10/12/2014   Procedure: WITH SYNOVECTOMY;  Surgeon: Gaynelle Arabian, MD;  Location: [facility];  Service: Orthopedics;  Laterality: Left;  . TONSILLECTOMY  1958  . TOTAL KNEE ARTHROPLASTY  02/05/2012   Procedure: TOTAL KNEE ARTHROPLASTY;  Surgeon: Gearlean Alf, MD;  Location: [facility];  Service: Orthopedics;  Laterality: Right;  . TOTAL KNEE ARTHROPLASTY Left 02/10/2013   Procedure: LEFT TOTAL KNEE ARTHROPLASTY;  Surgeon: Gearlean Alf, MD;  Location: [facility];  Service: Orthopedics;  Laterality: Left;  . total knee raplacement  01-2012   Right Knee  . TUBAL LIGATION  1997    Family History  Problem Relation Age of Onset  . Breast cancer Mother        sarcoma  . Lung cancer Mother   . Hypertension Mother   . Prostate cancer Father   . Congestive Heart Failure Father   . Heart attack Father   . Prostate cancer Brother   . Down syndrome Son   . CVA Son   . Heart disease Maternal Grandfather        MI  . Stomach cancer Maternal Aunt   . Uterine cancer Maternal Aunt   . Colon cancer Neg Hx     Social History   Socioeconomic History  . Marital status: Married    Spouse name: Deidre Ala  . Number of children: 3  . Years of education: Not on file  . Highest education level: Not on file  Occupational History  . Occupation: Copywriter, advertising: [employer]  Social Needs  . Financial resource strain: Not on file  . Food insecurity:    Worry: Not on file    Inability: Not on file  . Transportation needs:    Medical: Not on file    Non-medical: Not on file  Tobacco Use  . Smoking status: Never Smoker   . Smokeless tobacco: Never Used  Substance and Sexual Activity  . Alcohol use: No  . Drug use: No  . Sexual activity: Never  Lifestyle  . Physical activity:    Days per week: Not on file    Minutes per session: Not on file  . Stress: Not on file  Relationships  . Social connections:    Talks on phone: Not on file    Gets together: Not on file    Attends religious service: Not on file    Active member of  club or organization: Not on file    Attends meetings of clubs or organizations: Not on file    Relationship status: Not on file  . Intimate partner violence:    Fear of current or ex partner: Not on file    Emotionally abused: Not on file    Physically abused: Not on file    Forced sexual activity: Not on file  Other Topics Concern  . Not on file  Social History Narrative   Patient is right-handed. She lives with her husband in a 4 story home. They take care of their grown son with down syndrome who has had a stroke. She drinks 1-2 glasses of tea in the evenings. She does not formally exercise.    Outpatient Medications Prior to Visit  Medication Sig Dispense Refill  . acetaminophen (TYLENOL) 500 MG tablet Take 500-1,000 mg by mouth every 6 (six) hours as needed for moderate pain or headache.     Marland Kitchen aspirin EC 81 MG tablet Take 81 mg by mouth daily.    . B Complex-C (SUPER B COMPLEX PO) Take 1 tablet by mouth daily.    . carvedilol (COREG) 25 MG tablet Take 0.5 tablets (12.5 mg total) by mouth 2 (two) times daily with a meal. Hold if your blood pressure is <120/80 30 tablet 0  . ferrous sulfate 325 (65 FE) MG tablet Take 325 mg by mouth daily with breakfast.    . folic acid (FOLVITE) 1 MG tablet TAKE 1 TABLET BY MOUTH EVERY DAY 90 tablet 3  . gabapentin (NEURONTIN) 300 MG capsule Take 1 capsule (300 mg total) by mouth 2 (two) times daily as needed. 180 capsule 1  . hydrOXYzine (ATARAX/VISTARIL) 25 MG tablet Take 25 mg by mouth at bedtime.    . Multiple Vitamin (MULTIVITAMIN  WITH MINERALS) TABS tablet Take 1 tablet by mouth daily.    Marland Kitchen nystatin (MYCOSTATIN/NYSTOP) powder Apply topically 4 (four) times daily. 15 g 0  . pentosan polysulfate (ELMIRON) 100 MG capsule Take 100 mg by mouth 2 (two) times daily.     . rosuvastatin (CRESTOR) 20 MG tablet TAKE 1 TABLET BY MOUTH EVERYDAY AT BEDTIME 90 tablet 3  . telmisartan (MICARDIS) 80 MG tablet Take 0.5 tablets (40 mg total) by mouth at bedtime.    . clonazePAM (KLONOPIN) 0.5 MG tablet TAKE 1/2 TO 1 TABLET BY MOUTH 3 TIMES A DAY AS NEEDED FOR ANXIETY 60 tablet 2  . gabapentin (NEURONTIN) 300 MG capsule TAKE 1 CAPSULE BY MOUTH TWICE A DAY (Patient taking differently: daily. ) 180 capsule 1  . venlafaxine XR (EFFEXOR-XR) 75 MG 24 hr capsule Take 2 capsules (150 mg total) by mouth daily with breakfast. 180 capsule 2   No facility-administered medications prior to visit.     Allergies  Allergen Reactions  . Dilaudid [Hydromorphone Hcl] Itching    Review of Systems  Constitutional: Positive for malaise/fatigue. Negative for fever.  HENT: Negative for congestion.   Eyes: Negative for blurred vision.  Respiratory: Negative for shortness of breath.   Cardiovascular: Negative for chest pain, palpitations and leg swelling.  Gastrointestinal: Negative for abdominal pain, blood in stool and nausea.  Genitourinary: Negative for dysuria and frequency.  Musculoskeletal: Positive for back pain. Negative for falls.  Skin: Negative for rash.  Neurological: Positive for headaches. Negative for dizziness and loss of consciousness.  Endo/Heme/Allergies: Negative for environmental allergies.  Psychiatric/Behavioral: Negative for depression. The patient has insomnia. The patient is not nervous/anxious.  Objective:    Physical Exam Constitutional:      Appearance: Normal appearance.  HENT:     Head: Normocephalic and atraumatic.     Nose: Nose normal.  Pulmonary:     Effort: Pulmonary effort is normal.  Neurological:      Mental Status: She is alert and oriented to person, place, and time.  Psychiatric:        Mood and Affect: Mood normal.        Behavior: Behavior normal.     LMP 01/30/1994  Wt Readings from Last 3 Encounters:  04/05/18 178 lb (80.7 kg)  02/04/18 178 lb (80.7 kg)  12/25/17 181 lb (82.1 kg)    Diabetic Foot Exam - Simple   No data filed     Lab Results  Component Value Date   WBC 5.8 06/05/2018   HGB 12.8 06/05/2018   HCT 38.2 06/05/2018   PLT 222.0 06/05/2018   GLUCOSE 105 (H) 06/05/2018   CHOL 160 06/05/2018   TRIG 183.0 (H) 06/05/2018   HDL 43.40 06/05/2018   LDLDIRECT 73.0 02/04/2018   LDLCALC 80 06/05/2018   ALT 21 06/05/2018   AST 23 06/05/2018   NA 139 06/05/2018   K 4.2 06/05/2018   CL 104 06/05/2018   CREATININE 0.79 06/05/2018   BUN 13 06/05/2018   CO2 27 06/05/2018   TSH 2.93 06/05/2018   INR 1.06 03/30/2016   HGBA1C 5.8 06/05/2018    Lab Results  Component Value Date   TSH 2.93 06/05/2018   Lab Results  Component Value Date   WBC 5.8 06/05/2018   HGB 12.8 06/05/2018   HCT 38.2 06/05/2018   MCV 88.6 06/05/2018   PLT 222.0 06/05/2018   Lab Results  Component Value Date   NA 139 06/05/2018   K 4.2 06/05/2018   CHLORIDE 107 08/07/2016   CO2 27 06/05/2018   GLUCOSE 105 (H) 06/05/2018   BUN 13 06/05/2018   CREATININE 0.79 06/05/2018   BILITOT 0.5 06/05/2018   ALKPHOS 91 06/05/2018   AST 23 06/05/2018   ALT 21 06/05/2018   PROT 6.5 06/05/2018   ALBUMIN 4.2 06/05/2018   CALCIUM 9.1 06/05/2018   ANIONGAP 8 12/14/2017   EGFR 72 (L) 08/07/2016   GFR 71.14 06/05/2018   Lab Results  Component Value Date   CHOL 160 06/05/2018   Lab Results  Component Value Date   HDL 43.40 06/05/2018   Lab Results  Component Value Date   LDLCALC 80 06/05/2018   Lab Results  Component Value Date   TRIG 183.0 (H) 06/05/2018   Lab Results  Component Value Date   CHOLHDL 4 06/05/2018   Lab Results  Component Value Date   HGBA1C 5.8  06/05/2018       Assessment & Plan:   Problem List Items Addressed This Visit    Hyperlipidemia, mixed    Encouraged heart healthy diet, increase exercise, avoid trans fats, consider a krill oil cap daily      Depression with anxiety    Is managing but exhausted as they have not had any help caring for their some with special needs for over a month. Fortunately they have a new aide coming next week. No changes      Relevant Medications   venlafaxine XR (EFFEXOR-XR) 75 MG 24 hr capsule   Essential hypertension    no changes to meds. Encouraged heart healthy diet such as the DASH diet and exercise as tolerated.  Vitamin D deficiency    Supplement and monitor      Other headache syndrome    Notes neck and back tension then headaches occur. Encouraged increased hydration, 64 ounces of clear fluids daily. Minimize alcohol and caffeine. Eat small frequent meals with lean proteins and complex carbs. Avoid high and low blood sugars. Get adequate sleep, 7-8 hours a night. Needs exercise daily preferably in the morning. Tizanidine prn      Relevant Medications   tiZANidine (ZANAFLEX) 2 MG tablet   gabapentin (NEURONTIN) 300 MG capsule   venlafaxine XR (EFFEXOR-XR) 75 MG 24 hr capsule   clonazePAM (KLONOPIN) 0.5 MG tablet      I have changed Blanch Media D. Serres's gabapentin and clonazePAM. I am also having her start on tiZANidine. Additionally, I am having her maintain her hydrOXYzine, pentosan polysulfate, B Complex-C (SUPER B COMPLEX PO), acetaminophen, ferrous sulfate, multivitamin with minerals, carvedilol, nystatin, aspirin EC, rosuvastatin, folic acid, telmisartan, and venlafaxine XR.  Meds ordered this encounter  Medications  . tiZANidine (ZANAFLEX) 2 MG tablet    Sig: Take 0.5-2 tablets (1-4 mg total) by mouth 2 (two) times daily as needed for muscle spasms.    Dispense:  10 tablet    Refill:  1  . venlafaxine XR (EFFEXOR-XR) 75 MG 24 hr capsule    Sig: Take 2 capsules (150  mg total) by mouth daily with breakfast.    Dispense:  180 capsule    Refill:  2  . clonazePAM (KLONOPIN) 0.5 MG tablet    Sig: Take 0.5-1 tablets (0.25-0.5 mg total) by mouth 2 (two) times daily as needed for anxiety.    Dispense:  60 tablet    Refill:  2    Not to exceed 3 additional fills before 04/14/2018.     I discussed the assessment and treatment plan with the patient. The patient was provided an opportunity to ask questions and all were answered. The patient agreed with the plan and demonstrated an understanding of the instructions.   The patient was advised to call back or seek an in-person evaluation if the symptoms worsen or if the condition fails to improve as anticipated.  I provided 25 minutes of non-face-to-face time during this encounter.   Penni Homans, MD

## 2018-06-09 NOTE — Assessment & Plan Note (Signed)
Supplement and monitor 

## 2018-06-09 NOTE — Assessment & Plan Note (Signed)
Notes neck and back tension then headaches occur. Encouraged increased hydration, 64 ounces of clear fluids daily. Minimize alcohol and caffeine. Eat small frequent meals with lean proteins and complex carbs. Avoid high and low blood sugars. Get adequate sleep, 7-8 hours a night. Needs exercise daily preferably in the morning. Tizanidine prn

## 2018-06-09 NOTE — Assessment & Plan Note (Signed)
Is managing but exhausted as they have not had any help caring for their some with special needs for over a month. Fortunately they have a new aide coming next week. No changes

## 2018-06-09 NOTE — Assessment & Plan Note (Signed)
no changes to meds. Encouraged heart healthy diet such as the DASH diet and exercise as tolerated.  

## 2018-06-09 NOTE — Assessment & Plan Note (Signed)
Encouraged heart healthy diet, increase exercise, avoid trans fats, consider a krill oil cap daily 

## 2018-07-02 ENCOUNTER — Ambulatory Visit: Payer: Self-pay

## 2018-07-02 ENCOUNTER — Telehealth: Payer: Self-pay | Admitting: Internal Medicine

## 2018-07-02 ENCOUNTER — Ambulatory Visit (INDEPENDENT_AMBULATORY_CARE_PROVIDER_SITE_OTHER): Payer: Medicare Other | Admitting: Family

## 2018-07-02 VITALS — BP 114/80 | HR 78 | Temp 98.6°F | Resp 18 | Ht 61.0 in

## 2018-07-02 DIAGNOSIS — R197 Diarrhea, unspecified: Secondary | ICD-10-CM | POA: Diagnosis not present

## 2018-07-02 LAB — HEMOCCULT GUIAC POC 1CARD (OFFICE): Fecal Occult Blood, POC: NEGATIVE

## 2018-07-02 LAB — COMPREHENSIVE METABOLIC PANEL
AG Ratio: 2.2 (calc) (ref 1.0–2.5)
ALT: 25 U/L (ref 6–29)
AST: 22 U/L (ref 10–35)
Albumin: 4.3 g/dL (ref 3.6–5.1)
Alkaline phosphatase (APISO): 95 U/L (ref 37–153)
BUN: 13 mg/dL (ref 7–25)
CO2: 28 mmol/L (ref 20–32)
Calcium: 9.3 mg/dL (ref 8.6–10.4)
Chloride: 106 mmol/L (ref 98–110)
Creat: 0.84 mg/dL (ref 0.60–0.93)
Globulin: 2 g/dL (calc) (ref 1.9–3.7)
Glucose, Bld: 128 mg/dL — ABNORMAL HIGH (ref 65–99)
Potassium: 4.2 mmol/L (ref 3.5–5.3)
Sodium: 142 mmol/L (ref 135–146)
Total Bilirubin: 0.4 mg/dL (ref 0.2–1.2)
Total Protein: 6.3 g/dL (ref 6.1–8.1)

## 2018-07-02 LAB — CBC WITH DIFFERENTIAL/PLATELET
Absolute Monocytes: 501 cells/uL (ref 200–950)
Basophils Absolute: 33 cells/uL (ref 0–200)
Basophils Relative: 0.5 %
Eosinophils Absolute: 260 cells/uL (ref 15–500)
Eosinophils Relative: 4 %
HCT: 38.3 % (ref 35.0–45.0)
Hemoglobin: 12.5 g/dL (ref 11.7–15.5)
Lymphs Abs: 1593 cells/uL (ref 850–3900)
MCH: 29.3 pg (ref 27.0–33.0)
MCHC: 32.6 g/dL (ref 32.0–36.0)
MCV: 89.9 fL (ref 80.0–100.0)
MPV: 10.4 fL (ref 7.5–12.5)
Monocytes Relative: 7.7 %
Neutro Abs: 4115 cells/uL (ref 1500–7800)
Neutrophils Relative %: 63.3 %
Platelets: 246 10*3/uL (ref 140–400)
RBC: 4.26 10*6/uL (ref 3.80–5.10)
RDW: 13 % (ref 11.0–15.0)
Total Lymphocyte: 24.5 %
WBC: 6.5 10*3/uL (ref 3.8–10.8)

## 2018-07-02 NOTE — Progress Notes (Signed)
Virtual Visit via Video Note  I connected with Shannon [name] on 07/02/18 at  2:20 PM EDT by a video enabled telemedicine application and verified that I am speaking with the correct person using two identifiers. This visit type was conducted due to national recommendations for restrictions regarding the COVID-19 Pandemic (e.g. social distancing).  This format is felt to be most appropriate for this patient at this time.   I discussed the limitations of evaluation and management by telemedicine and the availability of in person appointments. The patient expressed understanding and agreed to proceed.  Only the patient and myself were on today's video visit. The patient was at home and I was in my office at the time of today's visit.   History of Present Illness:   Patient is a 73 yr old female who presents today with chief complaint of dark diarrhea. She reports that symptoms started last Thursday.She reports that Thursday night she had diarrhea.  Reports that it was "black water." Reports that diarrhea persisted all day and all night and then resolved briefly before beginning again. States her stools are typically dark due to her iron but not as dark as they have been. She reports that she does not feel bad, just diarrhea.  Denies abdominal pain, nausea, vomiting.  She reports no NSAID use.   Per patient 114/80 pulse 78  Did pepto bismol once.  Denies fatigue.   Past Medical History:  Diagnosis Date  . 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 Anemia 08/28/2011  . Asthma    as a child  . Brain tumor ([place])   . Breast cancer ([place])    right breast  . Carpal tunnel syndrome of left wrist   . Cataracts, bilateral 12/28/2015  . Constipation 03/03/2012  . DEPRESSION 08/11/2008  . Dermatitis   . Facial fracture ([place])   . GERD 08/11/2008  . H/O measles   . H/O mumps   . History of blood transfusion   . History of chicken pox   . History of hiatal hernia   . Hx breast cancer, IDC, Right,  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/2009   BRCA 2 NEGATIVE, CHEMO AND RADIATION X 1 YR  . HYPERLIPIDEMIA 08/11/2008  . HYPERTENSION 08/11/2008  . Hyponatremia 08/28/2011  . 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 Interstitial cystitis   . LIPOMA 02/10/2009  . Medicare annual wellness visit, subsequent 12/27/2014   Follows with Dr Amalia Hailey of urology Follows with Dr Posey Pronto at [facility] for opthamology Follows with Dr [name] of dermatology Follows with [facility] gastroenterology, last scope done in 2011 repeat in 10 years Last [facility] July 2015 should repeat every 1-2 years Last Pap December 2015, repeat in 3 years.   . Melanoma ([place]) 2008   DR. Ronnald Ramp  . Neuropathy   . NICM (nonischemic cardiomyopathy) ([place]) 03/08/2010   likely 2/2 chemotx - a. Echo 2012: EF 45-50%;  b. Lex MV 2/12:  low risk, apical defect (small area of ischemia vs shifting breast atten);  c.  Echo 7/12: Normal wall thickness, EF 60-65%, normal wall motion, grade 1 diastolic dysfunction, mild LAE, PASP 32;   d. Lex MV 11/13:  EF 76%, no ischemia  . OA (osteoarthritis) 09/07/2011  . Obesity 09/28/2014  . Osteoporosis   . Personal history of chemotherapy 2012  . Personal history of radiation therapy 2012  . PREDIABETES 06/15/2009  . Recurrent falls 12/28/2015  . Shortness of breath   . UTI (lower urinary tract infection)  03/03/2012  . Vertebral fracture, osteoporotic, sequela 04/05/2016     Social History   Socioeconomic History  . Marital status: Married    Spouse name: Deidre Ala  . Number of children: 3  . Years of education: Not on file  . Highest education level: Not on file  Occupational History  . Occupation: Copywriter, advertising: [employer]  Social Needs  . Financial resource strain: Not on file  . Food insecurity:    Worry: Not on file    Inability: Not on file  . Transportation needs:    Medical: Not on file    Non-medical: Not on file  Tobacco Use  . Smoking status: Never Smoker  . Smokeless tobacco: Never Used  Substance and  Sexual Activity  . Alcohol use: No  . Drug use: No  . Sexual activity: Never  Lifestyle  . Physical activity:    Days per week: Not on file    Minutes per session: Not on file  . Stress: Not on file  Relationships  . Social connections:    Talks on phone: Not on file    Gets together: Not on file    Attends religious service: Not on file    Active member of club or organization: Not on file    Attends meetings of clubs or organizations: Not on file    Relationship status: Not on file  . Intimate partner violence:    Fear of current or ex partner: Not on file    Emotionally abused: Not on file    Physically abused: Not on file    Forced sexual activity: Not on file  Other Topics Concern  . Not on file  Social History Narrative   Patient is right-handed. She lives with her husband in a 4 story home. They take care of their grown son with down syndrome who has had a stroke. She drinks 1-2 glasses of tea in the evenings. She does not formally exercise.    Past Surgical History:  Procedure Laterality Date  . BREAST LUMPECTOMY  04/2009   RIGHT FOR BREAST CANCER-CHEMO/RADIATION X 1 YEAR  . BREAST REDUCTION SURGERY Bilateral 05/04/2014   Procedure: MAMMARY REDUCTION  (BREAST);  Surgeon: Cristine Polio, MD;  Location: [facility];  Service: Plastics;  Laterality: Bilateral;  . CARPAL TUNNEL RELEASE     L wrist, ulnar nerve moved  . CHOLECYSTECTOMY    . CRANIOTOMY FOR TUMOR  2000  . ELBOW SURGERY     left  . KNEE ARTHROSCOPY Left 10/12/2014   Procedure: LEFT KNEE ARTHROSCOPY ;  Surgeon: Gaynelle Arabian, MD;  Location: [facility];  Service: Orthopedics;  Laterality: Left;  . KYPHOPLASTY N/A 03/30/2016   Procedure: THORACIC 12 KYPHOPLASTY;  Surgeon: Phylliss Bob, MD;  Location: [facility];  Service: Orthopedics;  Laterality: N/A;  THORACIC 12 KYPHOPLASTY  . LEFT HEART CATHETERIZATION WITH CORONARY ANGIOGRAM N/A 12/16/2013   Procedure: LEFT HEART CATHETERIZATION WITH CORONARY  ANGIOGRAM;  Surgeon: Peter M [name], MD;  Location: [facility] CATH LAB;  Service: Cardiovascular;  Laterality: N/A;  . LIPOMA EXCISION  03/28/2009   right leg  . MELANOMA EXCISION    . PORT-A-CATH REMOVAL  11/30/2010   [place]  . porta cath    . PORTACATH PLACEMENT  may 2011  . REDUCTION MAMMAPLASTY Bilateral   . SYNOVECTOMY Left 10/12/2014   Procedure: WITH SYNOVECTOMY;  Surgeon: Gaynelle Arabian, MD;  Location: [facility];  Service: Orthopedics;  Laterality: Left;  . TONSILLECTOMY  [place] ARTHROPLASTY  02/05/2012   Procedure: TOTAL KNEE ARTHROPLASTY;  Surgeon: Gearlean Alf, MD;  Location: [facility];  Service: Orthopedics;  Laterality: Right;  . TOTAL KNEE ARTHROPLASTY Left 02/10/2013   Procedure: LEFT TOTAL KNEE ARTHROPLASTY;  Surgeon: Gearlean Alf, MD;  Location: [facility];  Service: Orthopedics;  Laterality: Left;  . total knee raplacement  01-2012   Right Knee  . TUBAL LIGATION  1997    Family History  Problem Relation Age of Onset  . Breast cancer Mother        sarcoma  . Lung cancer Mother   . Hypertension Mother   . Prostate cancer Father   . Congestive Heart Failure Father   . Heart attack Father   . Prostate cancer Brother   . Down syndrome Son   . CVA Son   . Heart disease Maternal Grandfather        MI  . Stomach cancer Maternal Aunt   . Uterine cancer Maternal Aunt   . Colon cancer Neg Hx     Allergies  Allergen Reactions  . Dilaudid [Hydromorphone Hcl] Itching    Current Outpatient Medications on File Prior to Visit  Medication Sig Dispense Refill  . acetaminophen (TYLENOL) 500 MG tablet Take 500-1,000 mg by mouth every 6 (six) hours as needed for moderate pain or headache.     Marland Kitchen aspirin EC 81 MG tablet Take 81 mg by mouth daily.    . B Complex-C (SUPER B COMPLEX PO) Take 1 tablet by mouth daily.    . carvedilol (COREG) 25 MG tablet Take 0.5 tablets (12.5 mg total) by mouth 2 (two) times daily with a meal. Hold if your blood pressure is <120/80 30 tablet 0  .  clonazePAM (KLONOPIN) 0.5 MG tablet Take 0.5-1 tablets (0.25-0.5 mg total) by mouth 2 (two) times daily as needed for anxiety. 60 tablet 2  . ferrous sulfate 325 (65 FE) MG tablet Take 325 mg by mouth daily with breakfast.    . folic acid (FOLVITE) 1 MG tablet TAKE 1 TABLET BY MOUTH EVERY DAY 90 tablet 3  . gabapentin (NEURONTIN) 300 MG capsule Take 1 capsule (300 mg total) by mouth 2 (two) times daily as needed. 180 capsule 1  . hydrOXYzine (ATARAX/VISTARIL) 25 MG tablet Take 25 mg by mouth at bedtime.    . Multiple Vitamin (MULTIVITAMIN WITH MINERALS) TABS tablet Take 1 tablet by mouth daily.    Marland Kitchen nystatin (MYCOSTATIN/NYSTOP) powder Apply topically 4 (four) times daily. 15 g 0  . pentosan polysulfate (ELMIRON) 100 MG capsule Take 100 mg by mouth 2 (two) times daily.     . rosuvastatin (CRESTOR) 20 MG tablet TAKE 1 TABLET BY MOUTH EVERYDAY AT BEDTIME 90 tablet 3  . telmisartan (MICARDIS) 80 MG tablet Take 0.5 tablets (40 mg total) by mouth at bedtime.    Marland Kitchen tiZANidine (ZANAFLEX) 2 MG tablet Take 0.5-2 tablets (1-4 mg total) by mouth 2 (two) times daily as needed for muscle spasms. 10 tablet 1  . venlafaxine XR (EFFEXOR-XR) 75 MG 24 hr capsule Take 2 capsules (150 mg total) by mouth daily with breakfast. 180 capsule 2   No current facility-administered medications on file prior to visit.     BP 114/80   Pulse 78   Temp 98.6 F (37 C)   Resp 18   Ht '5\' 1"'  (1.549 m)   LMP 01/30/1994   BMI 33.63 kg/m     Observations/Objective:   Gen:  Awake, alert, no acute distress Resp: Breathing is even and non-labored Psych: calm/pleasant demeanor Neuro: Alert and Oriented x 3, + facial symmetry, speech is clear.   Assessment and Plan:  Diarrhea- pt was examined in the office by Dr. Larose Kells (see his phone note for details).  Heme negative. CMET/CBC unremarkable with the exception of mild elevation of glucose. (last A1C 5.7).  Will obtain stool for c diff.   Rectal polyp- (see Dr. Ethel Rana note).   This was new compared to exam noted from her colonoscopy 2011.  Since colo will be coming due this year and since she has a new rectal polyp will refer to GI for consult.   Follow Up Instructions:    I discussed the assessment and treatment plan with the patient. The patient was provided an opportunity to ask questions and all were answered. The patient agreed with the plan and demonstrated an understanding of the instructions.   The patient was advised to call back or seek an in-person evaluation if the symptoms worsen or if the condition fails to improve as anticipated.    Nance Pear, NP

## 2018-07-02 NOTE — Telephone Encounter (Signed)
The patient was assessed virtually by medically Shannon [name],she asked me to examine her: Abdomen: Soft, not tender, no mass or rebound DRE: Normal sphincter tone, she has a small polyp-like lesion right at the entrance of the anus. I found a very small amount of the stools, they are dark, Hemoccult negative.  No blood or mucus noted. Vital signs: BP 126/69, temperature 98.5, O2 sat 99%, heart rate 77 All of the above was  communicated to Shannon. [name]

## 2018-07-02 NOTE — Telephone Encounter (Signed)
Pt called stating that she started to have loose watery diarrhea on Saturday that is very dark and black. She states that Sunday her symptoms went away but have returned.  She mentions that she took one dose of pepto bismol on Sunday and has not take any since. She feels she is well hydrated.  She has not vomited.  She is eating. She denies pain. She denies bright red blood in the toilet or on her tissue. Care advice read to patient. Patient verbalized understanding of all instructions. Call transferred to office for appointment scheduling.  Reason for Disposition . [1] MODERATE diarrhea (e.g., 4-6 times / day more than normal) AND [2] age > 70 years  Answer Assessment - Initial Assessment Questions 1. DIARRHEA SEVERITY: "How bad is the diarrhea?" "How many extra stools have you had in the past 24 hours than normal?"    - NO DIARRHEA (SCALE 0)   - MILD (SCALE 1-3): Few loose or mushy BMs; increase of 1-3 stools over normal daily number of stools; mild increase in ostomy output.   -  MODERATE (SCALE 4-7): Increase of 4-6 stools daily over normal; moderate increase in ostomy output. * SEVERE (SCALE 8-10; OR 'WORST POSSIBLE'): Increase of 7 or more stools daily over normal; moderate increase in ostomy output; incontinence.     4-5 last night 2. ONSET: "When did the diarrhea begin?"      Saturday 3. BM CONSISTENCY: "How loose or watery is the diarrhea?"     Watery and black 4. VOMITING: "Are you also vomiting?" If so, ask: "How many times in the past 24 hours?"      No 5. ABDOMINAL PAIN: "Are you having any abdominal pain?" If yes: "What does it feel like?" (e.g., crampy, dull, intermittent, constant)      No really 6. ABDOMINAL PAIN SEVERITY: If present, ask: "How bad is the pain?"  (e.g., Scale 1-10; mild, moderate, or severe)   - MILD (1-3): doesn't interfere with normal activities, abdomen soft and not tender to touch    - MODERATE (4-7): interferes with normal activities or awakens from sleep,  tender to touch    - SEVERE (8-10): excruciating pain, doubled over, unable to do any normal activities       None 7. ORAL INTAKE: If vomiting, "Have you been able to drink liquids?" "How much fluids have you had in the past 24 hours?"     No problems 8. HYDRATION: "Any signs of dehydration?" (e.g., dry mouth [not just dry lips], too weak to stand, dizziness, new weight loss) "When did you last urinate?"     No has been lossing weight from stress 9. EXPOSURE: "Have you traveled to a foreign country recently?" "Have you been exposed to anyone with diarrhea?" "Could you have eaten any food that was spoiled?"    no 10. ANTIBIOTIC USE: "Are you taking antibiotics now or have you taken antibiotics in the past 2 months?"      no 11. OTHER SYMPTOMS: "Do you have any other symptoms?" (e.g., fever, blood in stool)      Black stool 12. PREGNANCY: "Is there any chance you are pregnant?" "When was your last menstrual period?"       N/A  Protocols used: DIARRHEA-A-AH

## 2018-07-02 NOTE — Telephone Encounter (Signed)
Pt has virtual visit with DOD at 2:20 today.

## 2018-07-03 ENCOUNTER — Telehealth: Payer: Self-pay | Admitting: Family

## 2018-07-03 ENCOUNTER — Other Ambulatory Visit: Payer: Medicare Other

## 2018-07-03 ENCOUNTER — Encounter: Payer: Self-pay | Admitting: Family

## 2018-07-03 DIAGNOSIS — R197 Diarrhea, unspecified: Secondary | ICD-10-CM | POA: Diagnosis not present

## 2018-07-03 NOTE — Telephone Encounter (Signed)
Please contact pt and let her know that her blood work looks good. How is her diarrhea?  I have placed a referral to GI due to Dr. Ethel Rana finding of a small polyp near her rectum and because of her diarrhea. She should return stool sample at her earliest convenience.

## 2018-07-03 NOTE — Telephone Encounter (Signed)
Results given to patient, advised of referral. She said she already turned in stool sample.

## 2018-07-03 NOTE — Telephone Encounter (Signed)
Sorry Venida Jarvis, I am routing to [place] instead.

## 2018-07-04 LAB — C. DIFFICILE GDH AND TOXIN A/B
GDH ANTIGEN: NOT DETECTED
MICRO NUMBER:: 533103
SPECIMEN QUALITY:: ADEQUATE
TOXIN A AND B: NOT DETECTED

## 2018-07-31 ENCOUNTER — Other Ambulatory Visit: Payer: Self-pay | Admitting: *Deleted

## 2018-07-31 ENCOUNTER — Other Ambulatory Visit: Payer: Self-pay | Admitting: Adult Health

## 2018-07-31 DIAGNOSIS — Z1231 Encounter for screening mammogram for malignant neoplasm of breast: Secondary | ICD-10-CM

## 2018-08-09 ENCOUNTER — Inpatient Hospital Stay: Payer: Medicare Other | Attending: Adult Health | Admitting: Adult Health

## 2018-08-09 DIAGNOSIS — Z5329 Procedure and treatment not carried out because of patient's decision for other reasons: Secondary | ICD-10-CM

## 2018-08-09 DIAGNOSIS — Z91199 Patient's noncompliance with other medical treatment and regimen due to unspecified reason: Secondary | ICD-10-CM

## 2018-08-09 NOTE — Progress Notes (Signed)
No show. Called patient and left message.    Wilber Bihari, NP

## 2018-08-13 ENCOUNTER — Ambulatory Visit: Payer: Medicare Other | Admitting: Gastroenterology

## 2018-08-14 ENCOUNTER — Telehealth: Payer: Self-pay | Admitting: Adult Health

## 2018-08-14 NOTE — Telephone Encounter (Signed)
I talk with patient regarding video visit °

## 2018-08-23 ENCOUNTER — Telehealth: Payer: Self-pay | Admitting: Adult Health

## 2018-08-23 ENCOUNTER — Inpatient Hospital Stay (HOSPITAL_BASED_OUTPATIENT_CLINIC_OR_DEPARTMENT_OTHER): Payer: Medicare Other | Admitting: Adult Health

## 2018-08-23 DIAGNOSIS — C50311 Malignant neoplasm of lower-inner quadrant of right female breast: Secondary | ICD-10-CM

## 2018-08-23 DIAGNOSIS — Z171 Estrogen receptor negative status [ER-]: Secondary | ICD-10-CM

## 2018-08-23 NOTE — Progress Notes (Signed)
Will reschedule to in person appointment.  Patient had conflict at time of appointment.  Wilber Bihari, NP

## 2018-08-23 NOTE — Telephone Encounter (Signed)
Patient called to reschedule her appointment that is on 09/16/2018 at 9 am due to her having a special son his nurse does not come in until 10 am so she prefer the appointment to be at 11 am or after please call to confirm. Ph# [phone]

## 2018-08-23 NOTE — Telephone Encounter (Signed)
I left a message regarding 8/17

## 2018-09-09 ENCOUNTER — Ambulatory Visit: Payer: Medicare Other | Admitting: Gastroenterology

## 2018-09-11 ENCOUNTER — Ambulatory Visit
Admission: RE | Admit: 2018-09-11 | Discharge: 2018-09-11 | Disposition: A | Discharge status: Home / Self Care | Payer: Medicare Other | Source: Ambulatory Visit | Attending: Adult Health | Admitting: Adult Health

## 2018-09-11 ENCOUNTER — Other Ambulatory Visit: Payer: Self-pay

## 2018-09-11 DIAGNOSIS — Z1231 Encounter for screening mammogram for malignant neoplasm of breast: Secondary | ICD-10-CM | POA: Diagnosis not present

## 2018-09-13 ENCOUNTER — Telehealth: Payer: Self-pay | Admitting: Oncology

## 2018-09-13 NOTE — Telephone Encounter (Signed)
Returned call to patient re rescheduling 8/17 appointment. Confirmed 8/27 appointment with patient.

## 2018-09-16 ENCOUNTER — Ambulatory Visit: Payer: Medicare Other | Admitting: Adult Health

## 2018-09-26 ENCOUNTER — Encounter: Payer: Self-pay | Admitting: Adult Health

## 2018-09-26 ENCOUNTER — Inpatient Hospital Stay: Payer: Medicare Other | Attending: Adult Health | Admitting: Adult Health

## 2018-09-26 ENCOUNTER — Other Ambulatory Visit: Payer: Self-pay

## 2018-09-26 ENCOUNTER — Ambulatory Visit (HOSPITAL_COMMUNITY)
Admission: RE | Admit: 2018-09-26 | Discharge: 2018-09-26 | Disposition: A | Discharge status: Home / Self Care | Payer: Medicare Other | Source: Ambulatory Visit | Attending: Adult Health | Admitting: Adult Health

## 2018-09-26 VITALS — BP 127/75 | HR 83 | Temp 98.3°F | Resp 16 | Ht 61.0 in | Wt 179.3 lb

## 2018-09-26 DIAGNOSIS — Z171 Estrogen receptor negative status [ER-]: Secondary | ICD-10-CM | POA: Diagnosis not present

## 2018-09-26 DIAGNOSIS — R0602 Shortness of breath: Secondary | ICD-10-CM | POA: Insufficient documentation

## 2018-09-26 DIAGNOSIS — C50311 Malignant neoplasm of lower-inner quadrant of right female breast: Secondary | ICD-10-CM

## 2018-09-26 DIAGNOSIS — Z853 Personal history of malignant neoplasm of breast: Secondary | ICD-10-CM | POA: Insufficient documentation

## 2018-09-26 NOTE — Progress Notes (Signed)
CLINIC:  Survivorship   REASON FOR VISIT:  Routine follow-up for history of breast cancer.   BRIEF ONCOLOGIC HISTORY:  Oncology History  Breast cancer of lower-inner quadrant of right female breast ([place])  04/2009 Initial Biopsy   Right breast core needle bx: IDC, ER/PR-, HER2/neu positive, Ki67 58%   04/2009 Clinical Stage   Stage IA: T1c No    - 08/2009 Neo-Adjuvant Chemotherapy   Taxol/ carbo/trastuzumab x 2, then Taxol/trastuzumab weekly x 3 (with last cycle with weekly carbo), A/C x 2 cycles with doxorubicin by continuous infusion (chemo poorly tolerated)   09/2009 Definitive Surgery   Right lumpectomy/SLNB: complete pathologic response    - 11/2009 Radiation Therapy   Adjuvant RT: right breast   09/2009 - 01/2010 Chemotherapy   Maintenance trastuzumab; discontinued due to drop in EF, which has since respolved      INTERVAL HISTORY:  [name] presents to the [facility] today for routine follow-up for her history of breast cancer.  Overall, she reports feeling quite well. She has developed some shortness of breath that started a few weeks ago.  She has h/o asthma and wonders if it slightly worse.  She notes that she feels it more when wearing a mask and wonders if she might benefit from an inhaler.    [name] has a rash under her breasts.  She notes she is using a cream given to her by her dermatologist.  She continues to take care of her debilitated son, but has recently hired a Marine scientist to help her, which will help relieve stress and allow her to exercise and go out with her friends, and run errands.    Since her last visit, she underwent a bilateral screening mammogram on 09/12/2018 that showed no evidence of malignancy and breast density category B.    REVIEW OF SYSTEMS:  Review of Systems  Constitutional: Negative for appetite change, chills, fatigue, fever and unexpected weight change.  HENT:   Negative for hearing loss, lump/mass and trouble swallowing.   Eyes:  Negative for eye problems and icterus.  Respiratory: Negative for chest tightness, cough and shortness of breath.   Cardiovascular: Negative for chest pain, leg swelling and palpitations.  Gastrointestinal: Negative for abdominal distention, abdominal pain, constipation, diarrhea, nausea and vomiting.  Endocrine: Negative for hot flashes.  Skin: Negative for itching and rash.  Neurological: Negative for dizziness, extremity weakness and headaches. Numbness:   Hematological: Negative for adenopathy. Does not bruise/bleed easily.  Psychiatric/Behavioral: Negative for depression. The patient is not nervous/anxious.   Breast: Denies any new nodularity, masses, tenderness, nipple changes, or nipple discharge.       PAST MEDICAL/SURGICAL HISTORY:  Past Medical History:  Diagnosis Date  . 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 Anemia 08/28/2011  . Asthma    as a child  . Brain tumor ([name])   . Breast cancer ([name])    right breast  . Carpal tunnel syndrome of left wrist   . Cataracts, bilateral 12/28/2015  . Constipation 03/03/2012  . DEPRESSION 08/11/2008  . Dermatitis   . Facial fracture ([name])   . GERD 08/11/2008  . H/O measles   . H/O mumps   . History of blood transfusion   . History of chicken pox   . History of hiatal hernia   . 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 HYPERLIPIDEMIA 08/11/2008  . HYPERTENSION 08/11/2008  .  Hyponatremia 08/28/2011  . 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 Interstitial cystitis   . LIPOMA 02/10/2009  . Medicare annual wellness visit, subsequent 12/27/2014   Follows with Dr Amalia Hailey of urology Follows with Dr Posey Pronto at [name] for opthamology Follows with Dr [name] of dermatology Follows with [facility] gastroenterology, last scope done in 2011 repeat in 10 years Last [facility] July 2015 should repeat every 1-2 years Last Pap December 2015, repeat in 3 years.   . Melanoma ([name]) 2008    DR. Ronnald Ramp  . Neuropathy   . NICM (nonischemic cardiomyopathy) ([name]) 03/08/2010   likely 2/2 chemotx - a. Echo 2012: EF 45-50%;  b. Lex MV 2/12:  low risk, apical defect (small area of ischemia vs shifting breast atten);  c.  Echo 7/12: Normal wall thickness, EF 60-65%, normal wall motion, grade 1 diastolic dysfunction, mild LAE, PASP 32;   d. Lex MV 11/13:  EF 76%, no ischemia  . OA (osteoarthritis) 09/07/2011  . Obesity 09/28/2014  . Osteoporosis   . Personal history of chemotherapy 2012  . Personal history of radiation therapy 2012  . PREDIABETES 06/15/2009  . Recurrent falls 12/28/2015  . Shortness of breath   . UTI (lower urinary tract infection) 03/03/2012  . Vertebral fracture, osteoporotic, sequela 04/05/2016   Past Surgical History:  Procedure Laterality Date  . BREAST LUMPECTOMY  04/2009   RIGHT FOR BREAST CANCER-CHEMO/RADIATION X 1 YEAR  . BREAST REDUCTION SURGERY Bilateral 05/04/2014   Procedure: MAMMARY REDUCTION  (BREAST);  Surgeon: Cristine Polio, MD;  Location: [name];  Service: Plastics;  Laterality: Bilateral;  . CARPAL TUNNEL RELEASE     L wrist, ulnar nerve moved  . CHOLECYSTECTOMY    . CRANIOTOMY FOR TUMOR  2000  . ELBOW SURGERY     left  . KNEE ARTHROSCOPY Left 10/12/2014   Procedure: LEFT KNEE ARTHROSCOPY ;  Surgeon: Gaynelle Arabian, MD;  Location: [facility];  Service: Orthopedics;  Laterality: Left;  . KYPHOPLASTY N/A 03/30/2016   Procedure: THORACIC 12 KYPHOPLASTY;  Surgeon: Phylliss Bob, MD;  Location: [name];  Service: Orthopedics;  Laterality: N/A;  THORACIC 12 KYPHOPLASTY  . LEFT HEART CATHETERIZATION WITH CORONARY ANGIOGRAM N/A 12/16/2013   Procedure: LEFT HEART CATHETERIZATION WITH CORONARY ANGIOGRAM;  Surgeon: Peter M [name], MD;  Location: [name] CATH LAB;  Service: Cardiovascular;  Laterality: N/A;  . LIPOMA EXCISION  03/28/2009   right leg  . MELANOMA EXCISION    . PORT-A-CATH REMOVAL  11/30/2010   Streck  . porta cath    . PORTACATH PLACEMENT  may  2011  . REDUCTION MAMMAPLASTY Bilateral   . SYNOVECTOMY Left 10/12/2014   Procedure: WITH SYNOVECTOMY;  Surgeon: Gaynelle Arabian, MD;  Location: [facility];  Service: Orthopedics;  Laterality: Left;  . TONSILLECTOMY  1958  . TOTAL KNEE ARTHROPLASTY  02/05/2012   Procedure: TOTAL KNEE ARTHROPLASTY;  Surgeon: Gearlean Alf, MD;  Location: [facility];  Service: Orthopedics;  Laterality: Right;  . TOTAL KNEE ARTHROPLASTY Left 02/10/2013   Procedure: LEFT TOTAL KNEE ARTHROPLASTY;  Surgeon: Gearlean Alf, MD;  Location: [facility];  Service: Orthopedics;  Laterality: Left;  . total knee raplacement  01-2012   Right Knee  . TUBAL LIGATION  1997     ALLERGIES:  Allergies  Allergen Reactions  . Dilaudid [Hydromorphone Hcl] Itching     CURRENT MEDICATIONS:  Outpatient Encounter Medications as of 09/26/2018  Medication Sig  . acetaminophen (TYLENOL) 500 MG tablet Take 500-1,000 mg by mouth every  6 (six) hours as needed for moderate pain or headache.   Marland Kitchen aspirin EC 81 MG tablet Take 81 mg by mouth daily.  . B Complex-C (SUPER B COMPLEX PO) Take 1 tablet by mouth daily.  . carvedilol (COREG) 25 MG tablet Take 0.5 tablets (12.5 mg total) by mouth 2 (two) times daily with a meal. Hold if your blood pressure is <120/80  . clonazePAM (KLONOPIN) 0.5 MG tablet Take 0.5-1 tablets (0.25-0.5 mg total) by mouth 2 (two) times daily as needed for anxiety.  . ferrous sulfate 325 (65 FE) MG tablet Take 325 mg by mouth daily with breakfast.  . folic acid (FOLVITE) 1 MG tablet TAKE 1 TABLET BY MOUTH EVERY DAY  . gabapentin (NEURONTIN) 300 MG capsule Take 1 capsule (300 mg total) by mouth 2 (two) times daily as needed.  . hydrOXYzine (ATARAX/VISTARIL) 25 MG tablet Take 25 mg by mouth at bedtime.  . Multiple Vitamin (MULTIVITAMIN WITH MINERALS) TABS tablet Take 1 tablet by mouth daily.  . pentosan polysulfate (ELMIRON) 100 MG capsule Take 100 mg by mouth 2 (two) times daily.   . rosuvastatin (CRESTOR) 20 MG tablet TAKE 1  TABLET BY MOUTH EVERYDAY AT BEDTIME  . telmisartan (MICARDIS) 80 MG tablet Take 0.5 tablets (40 mg total) by mouth at bedtime.  Marland Kitchen venlafaxine XR (EFFEXOR-XR) 75 MG 24 hr capsule Take 2 capsules (150 mg total) by mouth daily with breakfast.  . [DISCONTINUED] nystatin (MYCOSTATIN/NYSTOP) powder Apply topically 4 (four) times daily. (Patient not taking: Reported on 09/26/2018)  . [DISCONTINUED] tiZANidine (ZANAFLEX) 2 MG tablet Take 0.5-2 tablets (1-4 mg total) by mouth 2 (two) times daily as needed for muscle spasms. (Patient not taking: Reported on 09/26/2018)   No facility-administered encounter medications on file as of 09/26/2018.      ONCOLOGIC FAMILY HISTORY:  Family History  Problem Relation Age of Onset  . Breast cancer Mother        sarcoma  . Lung cancer Mother   . Hypertension Mother   . Prostate cancer Father   . Congestive Heart Failure Father   . Heart attack Father   . Prostate cancer Brother   . Down syndrome Son   . CVA Son   . Heart disease Maternal Grandfather        MI  . Stomach cancer Maternal Aunt   . Uterine cancer Maternal Aunt   . Colon cancer Neg Hx      SOCIAL HISTORY:  Social History   Socioeconomic History  . Marital status: Married    Spouse name: Deidre Ala  . Number of children: 3  . Years of education: Not on file  . Highest education level: Not on file  Occupational History  . Occupation: Copywriter, advertising: [employer]  Social Needs  . Financial resource strain: Not on file  . Food insecurity    Worry: Not on file    Inability: Not on file  . Transportation needs    Medical: Not on file    Non-medical: Not on file  Tobacco Use  . Smoking status: Never Smoker  . Smokeless tobacco: Never Used  Substance and Sexual Activity  . Alcohol use: No  . Drug use: No  . Sexual activity: Never  Lifestyle  . Physical activity    Days per week: Not on file    Minutes per session: Not on file  . Stress: Not on file  Relationships   . Social Herbalist on  phone: Not on file    Gets together: Not on file    Attends religious service: Not on file    Active member of club or organization: Not on file    Attends meetings of clubs or organizations: Not on file    Relationship status: Not on file  . Intimate partner violence    Fear of current or ex partner: Not on file    Emotionally abused: Not on file    Physically abused: Not on file    Forced sexual activity: Not on file  Other Topics Concern  . Not on file  Social History Narrative   Patient is right-handed. She lives with her husband in a 4 story home. They take care of their grown son with down syndrome who has had a stroke. She drinks 1-2 glasses of tea in the evenings. She does not formally exercise.      PHYSICAL EXAMINATION:  Vital Signs: Vitals:   09/26/18 1453  BP: 127/75  Pulse: 83  Resp: 16  Temp: 98.3 F (36.8 C)  SpO2: 97%   Filed Weights   09/26/18 1453  Weight: 179 lb 4.8 oz (81.3 kg)   General: Well-nourished, well-appearing female in no acute distress.  Unaccompanied today.   HEENT: Head is normocephalic.  Pupils equal and reactive to light. Conjunctivae clear without exudate.  Sclerae anicteric. Oral mucosa is pink, moist.  Oropharynx is pink without lesions or erythema.  Lymph: No cervical, supraclavicular, or infraclavicular lymphadenopathy noted on palpation.  Cardiovascular: Regular rate and rhythm.Marland Kitchen Respiratory: Clear to auscultation bilaterally. Chest expansion symmetric; breathing non-labored.  Breast Exam:  -Left breast: No appreciable masses on palpation. No skin redness, thickening, or peau d'orange appearance; no nipple retraction or nipple discharge;  -Right breast: No appreciable masses on palpation. No skin redness, thickening, or peau d'orange appearance; no nipple retraction or nipple discharge; mild distortion in symmetry at previous lumpectomy site well healed scar without erythema or nodularity. -Axilla:  No axillary adenopathy bilaterally.  GI: Abdomen soft and round; non-tender, non-distended. Bowel sounds normoactive. No hepatosplenomegaly.   GU: Deferred.  Neuro: No focal deficits. Steady gait.  Psych: Mood and affect normal and appropriate for situation.  MSK: No focal spinal tenderness to palpation, full range of motion in bilateral upper extremities Extremities: No edema. Skin: Warm and dry.  LABORATORY DATA:  None for this visit   DIAGNOSTIC IMAGING:  Most recent mammogram:     ASSESSMENT AND PLAN:  Ms.. Hoban is a [facility] 73 y.o. female with history of Stage IA right breast invasive ductal carcinoma, ER-/PR-/HER2+, diagnosed in 04/2009, treated with neo-adjuvant chemotherapy, maintenance trastuzumab, lumpectomy, and adjuvant radiation therapy.  [facility] for [facility] and routine follow-up.   1. History of breast cancer:  Ms. Weisensel is currently clinically and radiographically without evidence of disease or recurrence of breast cancer. She will be due for mammogram in 08/2018.  We will see her back in one year for LTS follow up I encouraged her to call me with any questions or concerns before her next visit at the cancer center, and I would be happy to see her sooner, if needed.    2. Shortness of breath: I placed orders for a chest xray today.  I recommend she f/u with her PCP about this, because if it is related to her asthma, she may benefit from PFTs, and closer management.  She verbalizes understanding and will set up an appointment once she receives her chest xray  results.    3. Bone health:  Given Ms. Marple's age, history of breast cancer, she is at risk for bone demineralization.  She was given education on specific food and activities to promote bone health.  4. Cancer screening:  Due to Ms. Belton's history and her age, she should receive screening for skin cancers, colon cancer, and gynecologic cancers. She was encouraged to follow-up with  her PCP for appropriate cancer screenings.   5. Health maintenance and wellness promotion: Ms. Detamore was encouraged to consume 5-7 servings of fruits and vegetables per day. She was also encouraged to engage in moderate to vigorous exercise for 30 minutes per day most days of the week. She was instructed to limit her alcohol consumption and continue to abstain from tobacco use.    Dispo:  -Return to cancer center in one year for LTS follow up -Mammogram in 08/2019    A total of (30) minutes of face-to-face time was spent with this patient with greater than 50% of that time in counseling and care-coordination.   Gardenia Phlegm, NP [facility] [place] [phone]   Note: PRIMARY CARE PROVIDER Mosie Lukes, [place] [phone]

## 2018-09-27 ENCOUNTER — Telehealth: Payer: Self-pay

## 2018-09-27 ENCOUNTER — Telehealth: Payer: Self-pay | Admitting: Adult Health

## 2018-09-27 NOTE — Telephone Encounter (Signed)
I talk with patient regarding schedule  

## 2018-09-27 NOTE — Telephone Encounter (Signed)
Spoke with patient to inform of chest xray results that shows no cancer.  Per NP, patient should follow up with her PCP for shortness of breath.  Patient voiced understanding and thinks maybe her shortness of breath is coming from wearing a mask all the time.

## 2018-09-27 NOTE — Telephone Encounter (Signed)
-----   Message from Gardenia Phlegm, NP sent at 09/27/2018  8:41 AM EDT ----- Please call patient with results.  She should f/u with PCP about her shortness of breath.  Xray shows no cancer ----- Message ----- From: Interface, Rad Results In Sent: 09/27/2018   8:35 AM EDT To: Gardenia Phlegm, NP

## 2018-09-30 NOTE — Progress Notes (Signed)
HPI: FU nonischemic cardiomyopathy. Patient had an echocardiogram in 2012 ejection fraction of 45-50%. Reduced LV function felt secondary to chemotherapy (herceptin). Nuclear study October 2015 showed an ejection fraction of 64%. There is a small area of moderate apical ischemia report. Cardiac catheterization November 2015 showed no obstructive coronary disease. Ejection fraction 50-55%.Last echocardiogram November 2017 showed normal LV systolic function, grade 2 diastolic dysfunction and mild left atrial enlargement. Carotid Dopplers January 2018 showed 1-39% bilateral stenosis. Since last seen,  patient denies increased dyspnea, exertional chest pain, palpitations or syncope.  Current Outpatient Medications  Medication Sig Dispense Refill  . acetaminophen (TYLENOL) 500 MG tablet Take 500-1,000 mg by mouth every 6 (six) hours as needed for moderate pain or headache.     Marland Kitchen aspirin EC 81 MG tablet Take 81 mg by mouth daily.    . B Complex-C (SUPER B COMPLEX PO) Take 1 tablet by mouth daily.    . carvedilol (COREG) 25 MG tablet Take 0.5 tablets (12.5 mg total) by mouth 2 (two) times daily with a meal. Hold if your blood pressure is <120/80 30 tablet 0  . clonazePAM (KLONOPIN) 0.5 MG tablet Take 0.5-1 tablets (0.25-0.5 mg total) by mouth 2 (two) times daily as needed for anxiety. 60 tablet 2  . ferrous sulfate 325 (65 FE) MG tablet Take 325 mg by mouth daily with breakfast.    . folic acid (FOLVITE) 1 MG tablet TAKE 1 TABLET BY MOUTH EVERY DAY 90 tablet 3  . gabapentin (NEURONTIN) 300 MG capsule Take 1 capsule (300 mg total) by mouth 2 (two) times daily as needed. 180 capsule 1  . hydrOXYzine (ATARAX/VISTARIL) 25 MG tablet Take 25 mg by mouth at bedtime.    . Multiple Vitamin (MULTIVITAMIN WITH MINERALS) TABS tablet Take 1 tablet by mouth daily.    . pentosan polysulfate (ELMIRON) 100 MG capsule Take 100 mg by mouth 2 (two) times daily.     . rosuvastatin (CRESTOR) 20 MG tablet TAKE 1 TABLET  BY MOUTH EVERYDAY AT BEDTIME 90 tablet 3  . telmisartan (MICARDIS) 80 MG tablet Take 0.5 tablets (40 mg total) by mouth at bedtime.    Marland Kitchen venlafaxine XR (EFFEXOR-XR) 75 MG 24 hr capsule Take 2 capsules (150 mg total) by mouth daily with breakfast. 180 capsule 2   No current facility-administered medications for this visit.      Past Medical History:  Diagnosis Date  . 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 Anemia 08/28/2011  . Asthma    as a child  . Brain tumor ([place])   . Breast cancer ([place])    right breast  . Carpal tunnel syndrome of left wrist   . Cataracts, bilateral 12/28/2015  . Constipation 03/03/2012  . DEPRESSION 08/11/2008  . Dermatitis   . Facial fracture ([place])   . GERD 08/11/2008  . H/O measles   . H/O mumps   . History of blood transfusion   . History of chicken pox   . History of hiatal hernia   . 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 HYPERLIPIDEMIA 08/11/2008  . HYPERTENSION 08/11/2008  . Hyponatremia 08/28/2011  . 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 Interstitial cystitis   . LIPOMA 02/10/2009  . Medicare annual wellness visit, subsequent 12/27/2014   Follows with Dr Amalia Hailey of urology Follows with Dr Posey Pronto at [facility] for opthamology Follows  with Dr Jordan of dermatology Follows with [facility] gastro[facility]ology, last scope done in 2011 repeat in 10 years Last MGM July 2015 should repeat every 1-2 years Last Pap December 2015, repeat in 3 years.   . Melanoma ([facility]) 2008   DR. JONES  . Neuropathy   . NICM (nonischemic cardiomyopathy) ([facility]) 03/08/2010   likely 2/2 chemotx - a. Echo 2012: EF 45-50%;  b. Lex MV 2/12:  low risk, apical defect (small area of ischemia vs shifting breast atten);  c.  Echo 7/12: Normal wall thickness, EF 60-65%, normal wall motion, grade 1 diastolic dysfunction, mild LAE, PASP 32;   d. Lex MV 11/13:  EF 76%, no ischemia  . OA (osteoarthritis) 09/07/2011  . Obesity  09/28/2014  . Osteoporosis   . Personal history of chemotherapy 2012  . Personal history of radiation therapy 2012  . PREDIABETES 06/15/2009  . Recurrent falls 12/28/2015  . Shortness of breath   . UTI (lower urinary tract infection) 03/03/2012  . Vertebral fracture, osteoporotic, sequela 04/05/2016    Past Surgical History:  Procedure Laterality Date  . BREAST LUMPECTOMY  04/2009   RIGHT FOR BREAST CANCER-CHEMO/RADIATION X 1 YEAR  . BREAST REDUCTION SURGERY Bilateral 05/04/2014   Procedure: MAMMARY REDUCTION  (BREAST);  Surgeon: Gerald Truesdale, MD;  Location: [facility];  Service: Plastics;  Laterality: Bilateral;  . CARPAL TUNNEL RELEASE     L wrist, ulnar nerve moved  . CHOLECYSTECTOMY    . CRANIOTOMY FOR TUMOR  2000  . ELBOW SURGERY     left  . KNEE ARTHROSCOPY Left 10/12/2014   Procedure: LEFT KNEE ARTHROSCOPY ;  Surgeon: Frank Aluisio, MD;  Location: [facility];  Service: Orthopedics;  Laterality: Left;  . KYPHOPLASTY N/A 03/30/2016   Procedure: THORACIC 12 KYPHOPLASTY;  Surgeon: Mark Dumonski, MD;  Location: [facility];  Service: Orthopedics;  Laterality: N/A;  THORACIC 12 KYPHOPLASTY  . LEFT HEART CATHETERIZATION WITH CORONARY ANGIOGRAM N/A 12/16/2013   Procedure: LEFT HEART CATHETERIZATION WITH CORONARY ANGIOGRAM;  Surgeon: Peter M Jordan, MD;  Location: [facility];  Service: Cardiovascular;  Laterality: N/A;  . LIPOMA EXCISION  03/28/2009   right leg  . MELANOMA EXCISION    . PORT-A-CATH REMOVAL  11/30/2010   Streck  . porta cath    . PORTACATH PLACEMENT  may 2011  . REDUCTION MAMMAPLASTY Bilateral   . SYNOVECTOMY Left 10/12/2014   Procedure: WITH SYNOVECTOMY;  Surgeon: Frank Aluisio, MD;  Location: [facility];  Service: Orthopedics;  Laterality: Left;  . TONSILLECTOMY  1958  . TOTAL KNEE ARTHROPLASTY  02/05/2012   Procedure: TOTAL KNEE ARTHROPLASTY;  Surgeon: Frank V Aluisio, MD;  Location: [facility];  Service: Orthopedics;  Laterality: Right;  . TOTAL KNEE ARTHROPLASTY Left  02/10/2013   Procedure: LEFT TOTAL KNEE ARTHROPLASTY;  Surgeon: Frank V Aluisio, MD;  Location: [facility];  Service: Orthopedics;  Laterality: Left;  . total knee raplacement  01-2012   Right Knee  . TUBAL LIGATION  1997    Social History   Socioeconomic History  . Marital status: Married    Spouse name: Ralph  . Number of children: 3  . Years of education: Not on file  . Highest education level: Not on file  Occupational History  . Occupation: boutique owner    Employer: [employer]  Social Needs  . Financial resource strain: Not on file  . Food insecurity    Worry: Not on file    Inability: Not on file  . Transportation   needs    Medical: Not on file    Non-medical: Not on file  Tobacco Use  . Smoking status: Never Smoker  . Smokeless tobacco: Never Used  Substance and Sexual Activity  . Alcohol use: No  . Drug use: No  . Sexual activity: Never  Lifestyle  . Physical activity    Days per week: Not on file    Minutes per session: Not on file  . Stress: Not on file  Relationships  . Social Herbalist on phone: Not on file    Gets together: Not on file    Attends religious service: Not on file    Active member of club or organization: Not on file    Attends meetings of clubs or organizations: Not on file    Relationship status: Not on file  . Intimate partner violence    Fear of current or ex partner: Not on file    Emotionally abused: Not on file    Physically abused: Not on file    Forced sexual activity: Not on file  Other Topics Concern  . Not on file  Social History Narrative   Patient is right-handed. She lives with her husband in a 4 story home. They take care of their grown son with down syndrome who has had a stroke. She drinks 1-2 glasses of tea in the evenings. She does not formally exercise.    Family History  Problem Relation Age of Onset  . Breast cancer Mother        sarcoma  . Lung cancer Mother   . Hypertension Mother   .  Prostate cancer Father   . Congestive Heart Failure Father   . Heart attack Father   . Prostate cancer Brother   . Down syndrome Son   . CVA Son   . Heart disease Maternal Grandfather        MI  . Stomach cancer Maternal Aunt   . Uterine cancer Maternal Aunt   . Colon cancer Neg Hx     ROS: Some fatigue but no fevers or chills, productive cough, hemoptysis, dysphasia, odynophagia, melena, hematochezia, dysuria, hematuria, rash, seizure activity, orthopnea, PND, pedal edema, claudication. Remaining systems are negative.  Physical Exam: Well-developed well-nourished in no acute distress.  Skin is warm and dry.  HEENT is normal.  Neck is supple.  Chest is clear to auscultation with normal expansion.  Cardiovascular exam is regular rate and rhythm.  Abdominal exam nontender or distended. No masses palpated. Extremities show no edema. neuro grossly intact  ECG-sinus rhythm, low voltage, cannot rule out prior anterior and inferior infarct.  Personally reviewed  A/P  1 NICM-LV function better on most recent echo. Continue ARB and beta blocker.  2 HTN-BP controlled; continue present meds and follow.  3 Hyperlipidemia-continue statin.  4 chest pain-patient denies any recent symptoms.  We will not pursue further ischemia evaluation at this point.  Kirk Ruths, MD

## 2018-10-01 ENCOUNTER — Encounter: Payer: Self-pay | Admitting: Cardiology

## 2018-10-01 ENCOUNTER — Ambulatory Visit (INDEPENDENT_AMBULATORY_CARE_PROVIDER_SITE_OTHER): Payer: Medicare Other | Admitting: Cardiology

## 2018-10-01 ENCOUNTER — Other Ambulatory Visit: Payer: Self-pay

## 2018-10-01 VITALS — BP 136/80 | HR 74 | Temp 97.2°F | Ht 60.0 in | Wt 182.0 lb

## 2018-10-01 DIAGNOSIS — I10 Essential (primary) hypertension: Secondary | ICD-10-CM | POA: Diagnosis not present

## 2018-10-01 DIAGNOSIS — E7800 Pure hypercholesterolemia, unspecified: Secondary | ICD-10-CM | POA: Diagnosis not present

## 2018-10-01 DIAGNOSIS — I420 Dilated cardiomyopathy: Secondary | ICD-10-CM | POA: Diagnosis not present

## 2018-10-01 NOTE — Patient Instructions (Signed)

## 2018-10-14 ENCOUNTER — Ambulatory Visit (INDEPENDENT_AMBULATORY_CARE_PROVIDER_SITE_OTHER): Payer: Medicare Other | Admitting: Gastroenterology

## 2018-10-14 ENCOUNTER — Encounter: Payer: Self-pay | Admitting: Gastroenterology

## 2018-10-14 VITALS — BP 130/70 | HR 80 | Temp 98.4°F | Ht 62.0 in | Wt 180.0 lb

## 2018-10-14 DIAGNOSIS — K621 Rectal polyp: Secondary | ICD-10-CM

## 2018-10-14 DIAGNOSIS — K625 Hemorrhage of anus and rectum: Secondary | ICD-10-CM | POA: Diagnosis not present

## 2018-10-14 DIAGNOSIS — Z23 Encounter for immunization: Secondary | ICD-10-CM | POA: Diagnosis not present

## 2018-10-14 MED ORDER — DICYCLOMINE HCL 20 MG PO TABS: 20.0000 mg | ORAL_TABLET | Freq: Four times a day (QID) | ORAL | 3 refills | Status: DC | PRN

## 2018-10-14 MED ORDER — NA SULFATE-K SULFATE-MG SULF 17.5-3.13-1.6 GM/177ML PO SOLN: 1.0000 | ORAL | 0 refills | Status: DC

## 2018-10-14 NOTE — Patient Instructions (Addendum)
I have recommended a colonoscopy for further evaluation.   Trial of dicyclomine 20 mg QID in the meantime to try to help with the cramping and need to have a bowel movement that happens with eating.   Tips for colonoscopy:  - Stay well hydrated for 3-4 days prior to the exam. This reduces nausea and dehydration.  - To prevent skin/hemorrhoid irritation - prior to wiping, put A&Dointment or vaseline on the toilet paper. - Keep a towel or pad on the bed.  - Drink  64oz of clear liquids in the morning of prep day (prior to starting the prep) to be sure that there is enough fluid to flush the colon and stay hydrated!!!! This is in addition to the fluids required for preparation. - Use of a flavored hard candy, such as grape Anise Salvo, can counteract some of the flavor of the prep and may prevent some nausea.

## 2018-10-14 NOTE — Progress Notes (Addendum)
Referring Provider: Debbrah Alar, NP Primary Care Physician:  Mosie Lukes, MD  Reason for Consultation:  Blood in the stool   IMPRESSION:  Rectal polyp on physical exam with PCP Blood in the stool - bright red blood with straining Intermittently black stools on iron    - Labs 07/02/18:  Hgb 12.5, MCV 89.9, RDW 13, platelets 246. BUN 13, crt 0.84    - heme + stool 07/02/18  Post-prandial cramping and defection Normal screening colonoscopy with Dr. Deatra Ina 2011 No known family history of colon cancer or polyps  Colonoscopy recommended to evaluate the abnormal rectal exam and bright red blood in the stool.  Trial of dicyclomine for treatment of post-prandial cramping and defection, with symptom onset temporally associated with stroke of her son.   PLAN: Trial of dicyclomine 20 mg QID PRN Colonoscopy  Please see the "Patient Instructions" section for addition details about the plan.  HPI: Shannon [name] is a 73 y.o. female referred by Dr. Charlett Blake for further evaluation of an abnormal rectal exam. The history is obtained through the patient and review of her electronic health record.  Worked for an [employer]ical [employer]. 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 Cares for her handicapped son who has had a stroke. She has a history of stage 1A;T1C breast cancer diagnosed in 2011 treated with neoadjuvant chemotherapy, definitive surgery, and radiation. She has nonischemic cardiomyopathy. Last echo in 2017 showed normal LVEF.   Recent rectal polyp on physical exam performed when she complained about black watery stools in June.  No associated symptoms.   FOBT negative 07/02/18. Hgb 12.5, MCV 89.9, RDW 13, platelets 246. BUN 13, crt 0.84.   Two-three  years of significant bloating and urge to defecate within 15 minutes of eating a large meal. Relieved by postprandial defecation.  Intermittent bright red blood with straining. Frequent black tarry stools. No constipation or  diarrhea.  Symptoms occurring more frequently since they started. May have been present prior to her son's stroke 3 years ago.  She was 89. She is now 173-180. Good appetite. She is having to buy new clothes.    Normal screening colonoscopy with Dr. Deatra Ina 05/18/09.  No PeptoBismal. Taking iron supplements.   No known family history of colon cancer or polyps. No family history of uterine/endometrial cancer, pancreatic cancer or gastric/stomach cancer.   Past Medical History:  Diagnosis Date  . 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 Anemia 08/28/2011  . Asthma    as a child  . Brain tumor ([place])   . Breast cancer ([place])    right breast  . Carpal tunnel syndrome of left wrist   . Cataracts, bilateral 12/28/2015  . Constipation 03/03/2012  . DEPRESSION 08/11/2008  . Dermatitis   . Facial fracture ([place])   . GERD 08/11/2008  . H/O measles   . H/O mumps   . History of blood transfusion   . History of chicken pox   . History of hiatal hernia   . 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 HYPERLIPIDEMIA 08/11/2008  . HYPERTENSION 08/11/2008  . Hyponatremia 08/28/2011  . 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 Interstitial cystitis   . LIPOMA 02/10/2009  . Medicare annual wellness visit, subsequent 12/27/2014   Follows with Dr Amalia Hailey of urology Follows with Dr Posey Pronto at [facility] for opthamology Follows with Dr [name] of  dermatology Follows with [facility]ology, last scope done in 2011 repeat in 10 years Last [facility] July 2015 should repeat every 1-2 years Last Pap December 2015, repeat in 3 years.   . Melanoma ([place]) 2008   DR. Ronnald Ramp  . Neuropathy   . NICM (nonischemic cardiomyopathy) ([place]) 03/08/2010   likely 2/2 chemotx - a. Echo 2012: EF 45-50%;  b. Lex MV 2/12:  low risk, apical defect (small area of ischemia vs shifting breast atten);  c.  Echo 7/12: Normal wall thickness, EF 60-65%, normal wall motion, grade 1  diastolic dysfunction, mild LAE, PASP 32;   d. Lex MV 11/13:  EF 76%, no ischemia  . OA (osteoarthritis) 09/07/2011  . Obesity 09/28/2014  . Osteoporosis   . Personal history of chemotherapy 2012  . Personal history of radiation therapy 2012  . PREDIABETES 06/15/2009  . Recurrent falls 12/28/2015  . Shortness of breath   . UTI (lower urinary tract infection) 03/03/2012  . Vertebral fracture, osteoporotic, sequela 04/05/2016    Past Surgical History:  Procedure Laterality Date  . BREAST LUMPECTOMY  04/2009   RIGHT FOR BREAST CANCER-CHEMO/RADIATION X 1 YEAR  . BREAST REDUCTION SURGERY Bilateral 05/04/2014   Procedure: MAMMARY REDUCTION  (BREAST);  Surgeon: Cristine Polio, MD;  Location: [facility];  Service: Plastics;  Laterality: Bilateral;  . CARPAL TUNNEL RELEASE     L wrist, ulnar nerve moved  . CHOLECYSTECTOMY    . CRANIOTOMY FOR TUMOR  2000  . ELBOW SURGERY     left  . KNEE ARTHROSCOPY Left 10/12/2014   Procedure: LEFT KNEE ARTHROSCOPY ;  Surgeon: Gaynelle Arabian, MD;  [facility];  Service: Orthopedics;  Laterality: Left;  . KYPHOPLASTY N/A 03/30/2016   Procedure: THORACIC 12 KYPHOPLASTY;  Surgeon: Phylliss Bob, MD;  Location: [facility];  Service: Orthopedics;  Laterality: N/A;  THORACIC 12 KYPHOPLASTY  . LEFT HEART CATHETERIZATION WITH CORONARY ANGIOGRAM N/A 12/16/2013   Procedure: LEFT HEART CATHETERIZATION WITH CORONARY ANGIOGRAM;  Surgeon: Peter M [name], MD;  Location: [facility];  Service: Cardiovascular;  Laterality: N/A;  . LIPOMA EXCISION  03/28/2009   right leg  . MELANOMA EXCISION    . PORT-A-CATH REMOVAL  11/30/2010   Streck  . porta cath    . PORTACATH PLACEMENT  may 2011  . REDUCTION MAMMAPLASTY Bilateral   . SYNOVECTOMY Left 10/12/2014   Procedure: WITH SYNOVECTOMY;  Surgeon: Gaynelle Arabian, MD;  [facility];  Service: Orthopedics;  Laterality: Left;  . TONSILLECTOMY  1958  . TOTAL KNEE ARTHROPLASTY  02/05/2012   Procedure: TOTAL KNEE ARTHROPLASTY;   Surgeon: Gearlean Alf, MD;  [facility];  Service: Orthopedics;  Laterality: Right;  . TOTAL KNEE ARTHROPLASTY Left 02/10/2013   Procedure: LEFT TOTAL KNEE ARTHROPLASTY;  Surgeon: Gearlean Alf, MD;  [facility];  Service: Orthopedics;  Laterality: Left;  . total knee raplacement  01-2012   Right Knee  . TUBAL LIGATION  1997    Current Outpatient Medications  Medication Sig Dispense Refill  . acetaminophen (TYLENOL) 500 MG tablet Take 500-1,000 mg by mouth every 6 (six) hours as needed for moderate pain or headache.     Marland Kitchen aspirin EC 81 MG tablet Take 81 mg by mouth daily.    . B Complex-C (SUPER B COMPLEX PO) Take 1 tablet by mouth daily.    . carvedilol (COREG) 25 MG tablet Take 0.5 tablets (12.5 mg total) by mouth 2 (two) times daily with a meal. Hold if  your blood pressure is <120/80 30 tablet 0  . clonazePAM (KLONOPIN) 0.5 MG tablet Take 0.5-1 tablets (0.25-0.5 mg total) by mouth 2 (two) times daily as needed for anxiety. 60 tablet 2  . ferrous sulfate 325 (65 FE) MG tablet Take 325 mg by mouth daily with breakfast.    . folic acid (FOLVITE) 1 MG tablet TAKE 1 TABLET BY MOUTH EVERY DAY 90 tablet 3  . gabapentin (NEURONTIN) 300 MG capsule Take 1 capsule (300 mg total) by mouth 2 (two) times daily as needed. 180 capsule 1  . hydrOXYzine (ATARAX/VISTARIL) 25 MG tablet Take 25 mg by mouth at bedtime.    . Multiple Vitamin (MULTIVITAMIN WITH MINERALS) TABS tablet Take 1 tablet by mouth daily.    . pentosan polysulfate (ELMIRON) 100 MG capsule Take 100 mg by mouth 2 (two) times daily.     . rosuvastatin (CRESTOR) 20 MG tablet TAKE 1 TABLET BY MOUTH EVERYDAY AT BEDTIME 90 tablet 3  . telmisartan (MICARDIS) 80 MG tablet Take 0.5 tablets (40 mg total) by mouth at bedtime.    Marland Kitchen venlafaxine XR (EFFEXOR-XR) 75 MG 24 hr capsule Take 2 capsules (150 mg total) by mouth daily with breakfast. 180 capsule 2   No current facility-administered medications for this visit.     Allergies as of  10/14/2018 - Review Complete 10/14/2018  Allergen Reaction Noted  . Dilaudid [hydromorphone hcl] Itching 02/07/2013    Family History  Problem Relation Age of Onset  . Breast cancer Mother        sarcoma  . Lung cancer Mother   . Hypertension Mother   . Prostate cancer Father   . Congestive Heart Failure Father   . Heart attack Father   . Prostate cancer Brother   . Down syndrome Son   . CVA Son   . Heart disease Maternal Grandfather        MI  . Stomach cancer Maternal Aunt   . Uterine cancer Maternal Aunt   . Colon cancer Neg Hx     Social History   Socioeconomic History  . Marital status: Married    Spouse name: Deidre Ala  . Number of children: 3  . Years of education: Not on file  . Highest education level: Not on file  Occupational History  . Occupation: boutique owner-retired    Employer: [employer]  . Financial resource strain: Not on file  . Food insecurity    Worry: Not on file    Inability: Not on file  . Transportation needs    Medical: Not on file    Non-medical: Not on file  Tobacco Use  . Smoking status: Never Smoker  . Smokeless tobacco: Never Used  Substance and Sexual Activity  . Alcohol use: No  . Drug use: No  . Sexual activity: Not Currently  Lifestyle  . Physical activity    Days per week: Not on file    Minutes per session: Not on file  . Stress: Not on file  Relationships  . Social Herbalist on phone: Not on file    Gets together: Not on file    Attends religious service: Not on file    Active member of club or organization: Not on file    Attends meetings of clubs or organizations: Not on file    Relationship status: Not on file  . Intimate partner violence    Fear of current or ex partner: Not on file  Emotionally abused: Not on file    Physically abused: Not on file    Forced sexual activity: Not on file  Other Topics Concern  . Not on file  Social History Narrative   Patient is right-handed.  She lives with her husband in a 4 story home. They take care of their grown son with down syndrome who has had a stroke. She drinks 1-2 glasses of tea in the evenings. She does not formally exercise.    Review of Systems: 12 system ROS is negative except as noted above except for anxiety, back pain, fatigue, shortness of breath, insomnia, and excessive urination.   Physical Exam: General:   Alert,  well-nourished, pleasant and cooperative in NAD Head:  Normocephalic and atraumatic. Eyes:  Sclera clear, no icterus.   Conjunctiva pink. Ears:  Normal auditory acuity. Nose:  No deformity, discharge,  or lesions. Mouth:  No deformity or lesions.   Neck:  Supple; no masses or thyromegaly. Lungs:  Clear throughout to auscultation.   No wheezes. Heart:  Regular rate and rhythm; no murmurs. Abdomen:  Soft,nontender, nondistended, normal bowel sounds, no rebound or guarding. No hepatosplenomegaly.   Rectal:  Deferred  Msk:  Symmetrical. No boney deformities LAD: No inguinal or umbilical LAD Extremities:  No clubbing or edema. Neurologic:  Alert and  oriented x4;  grossly nonfocal Skin:  Intact without significant lesions or rashes. Psych:  Alert and cooperative. Normal mood and affect.    Areli Frary L. Tarri Glenn, MD, MPH 10/14/2018, 2:26 PM

## 2018-10-15 DIAGNOSIS — N3010 Interstitial cystitis (chronic) without hematuria: Secondary | ICD-10-CM | POA: Diagnosis not present

## 2018-10-18 NOTE — Progress Notes (Deleted)
Virtual Visit via Video Note  I connected with patient on 10/21/18 at  1:45 PM EDT by audio enabled telemedicine application and verified that I am speaking with the correct person using two identifiers.   THIS ENCOUNTER IS A VIRTUAL VISIT DUE TO COVID-19 - PATIENT WAS NOT SEEN IN THE OFFICE. PATIENT HAS CONSENTED TO VIRTUAL VISIT / TELEMEDICINE VISIT   Location of patient: home  Location of provider: office  I discussed the limitations of evaluation and management by telemedicine and the availability of in person appointments. The patient expressed understanding and agreed to proceed.   Subjective:   Shannon [name] is a 73 y.o. female who presents for Medicare Annual/Subsequent preventive examination.  Pt is primary caregiver for son who has down syndrome and stroke.  Review of Systems:  Home Safety/Smoke Alarms: Feels safe in home. Smoke alarms in place.  Lives in 4 story home. Pt reports she navigates stairs well.   Female:         Mammo-09/11/18       Dexa scan-01/10/18        CCS-last 05/18/09. Pt states 10 yr recall     Objective:    Vitals: LMP 01/30/1994   There is no height or weight on file to calculate BMI.  Advanced Directives 01/03/2018 10/16/2017 03/30/2016 02/27/2016 12/28/2015 05/17/2015 12/21/2014  Does Patient Have a Medical Advance Directive? Yes Yes Yes Yes Yes Yes Yes  Type of Advance Directive - [place];Living will Living will Living will;Healthcare Power of [place];Living will Healthcare Power of [place];Living will  Does patient want to make changes to medical advance directive? No - Patient declined - No - Patient declined - Yes (MAU/Ambulatory/Procedural Areas - Information given) No - Patient declined -  Copy of [place] in Chart? - No - copy requested - - No - copy requested No - copy requested No - copy requested  Pre-existing out of facility DNR order (yellow  form or pink MOST form) - - - - - - -    Tobacco Social History   Tobacco Use  Smoking Status Never Smoker  Smokeless Tobacco Never Used     Counseling given: Not Answered   Clinical Intake:                       Past Medical History:  Diagnosis Date  . 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 Anemia 08/28/2011  . Asthma    as a child  . Brain tumor ([place])   . Breast cancer ([place])    right breast  . Carpal tunnel syndrome of left wrist   . Cataracts, bilateral 12/28/2015  . Constipation 03/03/2012  . DEPRESSION 08/11/2008  . Dermatitis   . Facial fracture ([place])   . GERD 08/11/2008  . H/O measles   . H/O mumps   . History of blood transfusion   . History of chicken pox   . History of hiatal hernia   . 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 HYPERLIPIDEMIA 08/11/2008  . HYPERTENSION 08/11/2008  . Hyponatremia 08/28/2011  . 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 Interstitial cystitis   . LIPOMA 02/10/2009  . Medicare annual wellness visit, subsequent 12/27/2014   Follows with Dr Amalia Hailey of urology Follows with Dr Posey Pronto at [facility] for opthamology Follows  with Dr [name] of dermatology Follows with [facility] gastro[facility]ology, last scope done in 2011 repeat in 10 years Last [facility] July 2015 should repeat every 1-2 years Last Pap December 2015, repeat in 3 years.   . Melanoma ([place]) 2008   DR. Ronnald Ramp  . Neuropathy   . NICM (nonischemic cardiomyopathy) ([place]) 03/08/2010   likely 2/2 chemotx - a. Echo 2012: EF 45-50%;  b. Lex MV 2/12:  low risk, apical defect (small area of ischemia vs shifting breast atten);  c.  Echo 7/12: Normal wall thickness, EF 60-65%, normal wall motion, grade 1 diastolic dysfunction, mild LAE, PASP 32;   d. Lex MV 11/13:  EF 76%, no ischemia  . OA (osteoarthritis) 09/07/2011  . Obesity 09/28/2014  . Osteoporosis   . Personal history of chemotherapy 2012  . Personal history of  radiation therapy 2012  . PREDIABETES 06/15/2009  . Recurrent falls 12/28/2015  . Shortness of breath   . UTI (lower urinary tract infection) 03/03/2012  . Vertebral fracture, osteoporotic, sequela 04/05/2016   Past Surgical History:  Procedure Laterality Date  . BREAST LUMPECTOMY  04/2009   RIGHT FOR BREAST CANCER-CHEMO/RADIATION X 1 YEAR  . BREAST REDUCTION SURGERY Bilateral 05/04/2014   Procedure: MAMMARY REDUCTION  (BREAST);  Surgeon: Cristine Polio, MD;  Location: [facility];  Service: Plastics;  Laterality: Bilateral;  . CARPAL TUNNEL RELEASE     L wrist, ulnar nerve moved  . CHOLECYSTECTOMY    . CRANIOTOMY FOR TUMOR  2000  . ELBOW SURGERY     left  . KNEE ARTHROSCOPY Left 10/12/2014   Procedure: LEFT KNEE ARTHROSCOPY ;  Surgeon: Gaynelle Arabian, MD;  Location: [facility];  Service: Orthopedics;  Laterality: Left;  . KYPHOPLASTY N/A 03/30/2016   Procedure: THORACIC 12 KYPHOPLASTY;  Surgeon: Phylliss Bob, MD;  Location: [facility];  Service: Orthopedics;  Laterality: N/A;  THORACIC 12 KYPHOPLASTY  . LEFT HEART CATHETERIZATION WITH CORONARY ANGIOGRAM N/A 12/16/2013   Procedure: LEFT HEART CATHETERIZATION WITH CORONARY ANGIOGRAM;  Surgeon: Peter M [name], MD;  Location: [facility] CATH LAB;  Service: Cardiovascular;  Laterality: N/A;  . LIPOMA EXCISION  03/28/2009   right leg  . MELANOMA EXCISION    . PORT-A-CATH REMOVAL  11/30/2010   Streck  . porta cath    . PORTACATH PLACEMENT  may 2011  . REDUCTION MAMMAPLASTY Bilateral   . SYNOVECTOMY Left 10/12/2014   Procedure: WITH SYNOVECTOMY;  Surgeon: Gaynelle Arabian, MD;  Location: [facility];  Service: Orthopedics;  Laterality: Left;  . TONSILLECTOMY  1958  . TOTAL KNEE ARTHROPLASTY  02/05/2012   Procedure: TOTAL KNEE ARTHROPLASTY;  Surgeon: Gearlean Alf, MD;  Location: [facility];  Service: Orthopedics;  Laterality: Right;  . TOTAL KNEE ARTHROPLASTY Left 02/10/2013   Procedure: LEFT TOTAL KNEE ARTHROPLASTY;  Surgeon: Gearlean Alf, MD;  Location:  [facility];  Service: Orthopedics;  Laterality: Left;  . total knee raplacement  01-2012   Right Knee  . TUBAL LIGATION  1997   Family History  Problem Relation Age of Onset  . Breast cancer Mother        sarcoma  . Lung cancer Mother   . Hypertension Mother   . Prostate cancer Father   . Congestive Heart Failure Father   . Heart attack Father   . Prostate cancer Brother   . Down syndrome Son   . CVA Son   . Heart disease Maternal Grandfather        MI  . Stomach cancer Maternal  Aunt   . Uterine cancer Maternal Aunt   . Colon cancer Neg Hx    Social History   Socioeconomic History  . Marital status: Married    Spouse name: Deidre Ala  . Number of children: 3  . Years of education: Not on file  . Highest education level: Not on file  Occupational History  . Occupation: boutique owner-retired    Employer: [employer]  . Financial resource strain: Not on file  . Food insecurity    Worry: Not on file    Inability: Not on file  . Transportation needs    Medical: Not on file    Non-medical: Not on file  Tobacco Use  . Smoking status: Never Smoker  . Smokeless tobacco: Never Used  Substance and Sexual Activity  . Alcohol use: No  . Drug use: No  . Sexual activity: Not Currently  Lifestyle  . Physical activity    Days per week: Not on file    Minutes per session: Not on file  . Stress: Not on file  Relationships  . Social Herbalist on phone: Not on file    Gets together: Not on file    Attends religious service: Not on file    Active member of club or organization: Not on file    Attends meetings of clubs or organizations: Not on file    Relationship status: Not on file  Other Topics Concern  . Not on file  Social History Narrative   Patient is right-handed. She lives with her husband in a 4 story home. They take care of their grown son with down syndrome who has had a stroke. She drinks 1-2 glasses of tea in the evenings. She does not  formally exercise.    Outpatient Encounter Medications as of 10/21/2018  Medication Sig  . acetaminophen (TYLENOL) 500 MG tablet Take 500-1,000 mg by mouth every 6 (six) hours as needed for moderate pain or headache.   Marland Kitchen aspirin EC 81 MG tablet Take 81 mg by mouth daily.  . B Complex-C (SUPER B COMPLEX PO) Take 1 tablet by mouth daily.  . carvedilol (COREG) 25 MG tablet Take 0.5 tablets (12.5 mg total) by mouth 2 (two) times daily with a meal. Hold if your blood pressure is <120/80  . clonazePAM (KLONOPIN) 0.5 MG tablet Take 0.5-1 tablets (0.25-0.5 mg total) by mouth 2 (two) times daily as needed for anxiety.  . dicyclomine (BENTYL) 20 MG tablet Take 1 tablet (20 mg total) by mouth 4 (four) times daily as needed for spasms.  . ferrous sulfate 325 (65 FE) MG tablet Take 325 mg by mouth daily with breakfast.  . folic acid (FOLVITE) 1 MG tablet TAKE 1 TABLET BY MOUTH EVERY DAY  . gabapentin (NEURONTIN) 300 MG capsule Take 1 capsule (300 mg total) by mouth 2 (two) times daily as needed.  . hydrOXYzine (ATARAX/VISTARIL) 25 MG tablet Take 25 mg by mouth at bedtime.  . Multiple Vitamin (MULTIVITAMIN WITH MINERALS) TABS tablet Take 1 tablet by mouth daily.  . Na Sulfate-K Sulfate-Mg Sulf 17.5-3.13-1.6 GM/177ML SOLN Take 1 kit by mouth as directed.  . pentosan polysulfate (ELMIRON) 100 MG capsule Take 100 mg by mouth 2 (two) times daily.   . rosuvastatin (CRESTOR) 20 MG tablet TAKE 1 TABLET BY MOUTH EVERYDAY AT BEDTIME  . telmisartan (MICARDIS) 80 MG tablet Take 0.5 tablets (40 mg total) by mouth at bedtime.  Marland Kitchen venlafaxine XR (EFFEXOR-XR) 75 MG 24  hr capsule Take 2 capsules (150 mg total) by mouth daily with breakfast.   No facility-administered encounter medications on file as of 10/21/2018.     Activities of Daily Living No flowsheet data found.  Patient Care Team: Mosie Lukes, MD as PCP - General (Family Medicine) Domingo Pulse, MD as Consulting Physician (Urology) Calvert Cantor, MD as  Consulting Physician (Ophthalmology) Stanford Breed Denice Bors, MD as Consulting Physician (Cardiology)   Assessment:   This is a routine wellness examination for [place]. Physical assessment deferred to PCP.  Exercise Activities and Dietary recommendations   Diet (meal preparation, eat out, water intake, caffeinated beverages, dairy products, fruits and vegetables): {Desc; diets:16563} Breakfast: Lunch:  Dinner:      Goals    . DIET - EAT MORE FRUITS AND VEGETABLES    . DIET - INCREASE WATER INTAKE       Fall Risk Fall Risk  12/25/2017 10/16/2017 10/16/2017 12/28/2015 05/24/2015  Falls in the past year? 1 No No Yes Yes  Number falls in past yr: 0 - - 2 or more 1  Injury with Fall? 0 - - Yes Yes  Comment - - - Cracked bone around her eye, Bruised her side. Rushing out the garage and slipped and fell. -  Risk Factor Category  - - - - High Fall Risk  Follow up Falls evaluation completed - - - Falls evaluation completed;Education provided;Falls prevention discussed    Depression Screen PHQ 2/9 Scores 10/16/2017 10/16/2017 12/28/2015  PHQ - 2 Score 0 0 6  PHQ- 9 Score - - 12    Cognitive Function Ad8 score reviewed for issues:  Issues making decisions:  Less interest in hobbies / activities:  Repeats questions, stories (family complaining):  Trouble using ordinary gadgets (microwave, computer, phone):  Forgets the month or year:   Mismanaging finances:   Remembering appts:  Daily problems with thinking and/or memory: Ad8 score is=         Immunization History  Administered Date(s) Administered  . Influenza Split 11/11/2010, 11/14/2011  . Influenza Whole 02/10/2009  . Influenza, High Dose Seasonal PF 11/04/2015, 10/16/2017  . Influenza,inj,Quad PF,6+ Mos 10/18/2012, 10/10/2013, 12/21/2014  . Influenza-Unspecified 08/30/2016  . Pneumococcal Conjugate-13 12/21/2014  . Pneumococcal Polysaccharide-23 09/04/2011  . Td 01/30/2001, 12/28/2015   Screening Tests Health  Maintenance  Topic Date Due  . INFLUENZA VACCINE  08/31/2018  . COLONOSCOPY  05/19/2019  . MAMMOGRAM  09/10/2020  . TETANUS/TDAP  12/27/2025  . DEXA SCAN  Completed  . Hepatitis C Screening  Completed  . PNA vac Low Risk Adult  Completed        Plan:   ***  I have personally reviewed and noted the following in the patient's chart:   . Medical and social history . Use of alcohol, tobacco or illicit drugs  . Current medications and supplements . Functional ability and status . Nutritional status . Physical activity . Advanced directives . List of other physicians . Hospitalizations, surgeries, and ER visits in previous 12 months . Vitals . Screenings to include cognitive, depression, and falls . Referrals and appointments  In addition, I have reviewed and discussed with patient certain preventive protocols, quality metrics, and best practice recommendations. A written personalized care plan for preventive services as well as general preventive health recommendations were provided to patient.     Naaman Plummer [place], [place]  10/18/2018

## 2018-10-21 ENCOUNTER — Ambulatory Visit: Payer: Medicare Other | Admitting: *Deleted

## 2018-10-21 ENCOUNTER — Other Ambulatory Visit: Payer: Self-pay

## 2018-11-05 ENCOUNTER — Telehealth: Payer: Self-pay

## 2018-11-05 NOTE — Telephone Encounter (Signed)
Pt returned call and answered “No” to all questions.  ° °

## 2018-11-05 NOTE — Telephone Encounter (Signed)
Covid-19 screening questions   Do you now or have you had a fever in the last 14 days?  Do you have any respiratory symptoms of shortness of breath or cough now or in the last 14 days?  Do you have any family members or close contacts with diagnosed or suspected Covid-19 in the past 14 days?  Have you been tested for Covid-19 and found to be positive?       

## 2018-11-06 ENCOUNTER — Ambulatory Visit (AMBULATORY_SURGERY_CENTER): Payer: Medicare Other | Admitting: Gastroenterology

## 2018-11-06 ENCOUNTER — Other Ambulatory Visit: Payer: Self-pay | Admitting: Gastroenterology

## 2018-11-06 ENCOUNTER — Encounter: Payer: Self-pay | Admitting: Gastroenterology

## 2018-11-06 ENCOUNTER — Other Ambulatory Visit: Payer: Self-pay

## 2018-11-06 VITALS — BP 96/60 | HR 74 | Temp 97.9°F | Resp 14 | Ht 62.0 in | Wt 180.0 lb

## 2018-11-06 DIAGNOSIS — K621 Rectal polyp: Secondary | ICD-10-CM

## 2018-11-06 DIAGNOSIS — K635 Polyp of colon: Secondary | ICD-10-CM | POA: Diagnosis not present

## 2018-11-06 DIAGNOSIS — D122 Benign neoplasm of ascending colon: Secondary | ICD-10-CM

## 2018-11-06 DIAGNOSIS — Z86010 Personal history of colonic polyps: Secondary | ICD-10-CM | POA: Diagnosis not present

## 2018-11-06 MED ORDER — SODIUM CHLORIDE 0.9 % IV SOLN: 500.0000 mL | Freq: Once | INTRAVENOUS | Status: DC

## 2018-11-06 NOTE — Progress Notes (Signed)
Called to room to assist during endoscopic procedure.  Patient ID and intended procedure confirmed with present staff. Received instructions for my participation in the procedure from the performing physician.  

## 2018-11-06 NOTE — Patient Instructions (Signed)
YOU HAD AN ENDOSCOPIC PROCEDURE TODAY AT THE [facility]:   Refer to the procedure report that was given to you for any specific questions about what was found during the examination.  If the procedure report does not answer your questions, please call your gastroenterologist to clarify.  If you requested that your care partner not be given the details of your procedure findings, then the procedure report has been included in a sealed envelope for you to review at your convenience later.  YOU SHOULD EXPECT: Some feelings of bloating in the abdomen. Passage of more gas than usual.  Walking can help get rid of the air that was put into your GI tract during the procedure and reduce the bloating. If you had a lower endoscopy (such as a colonoscopy or flexible sigmoidoscopy) you may notice spotting of blood in your stool or on the toilet paper. If you underwent a bowel prep for your procedure, you may not have a normal bowel movement for a few days.  Please Note:  You might notice some irritation and congestion in your nose or some drainage.  This is from the oxygen used during your procedure.  There is no need for concern and it should clear up in a day or so.  SYMPTOMS TO REPORT IMMEDIATELY:   Following lower endoscopy (colonoscopy or flexible sigmoidoscopy):  Excessive amounts of blood in the stool  Significant tenderness or worsening of abdominal pains  Swelling of the abdomen that is new, acute  Fever of 100F or higher  For urgent or emergent issues, a gastroenterologist can be reached at any hour by calling [phone].   DIET:  We do recommend a small meal at first, but then you may proceed to your regular diet.  Drink plenty of fluids but you should avoid alcoholic beverages for 24 hours.  ACTIVITY:  You should plan to take it easy for the rest of today and you should NOT DRIVE or use heavy machinery until tomorrow (because of the sedation medicines used during the test).     FOLLOW UP: Our staff will call the number listed on your records 48-72 hours following your procedure to check on you and address any questions or concerns that you may have regarding the information given to you following your procedure. If we do not reach you, we will leave a message.  We will attempt to reach you two times.  During this call, we will ask if you have developed any symptoms of COVID 19. If you develop any symptoms (ie: fever, flu-like symptoms, shortness of breath, cough etc.) before then, please call [phone].  If you test positive for Covid 19 in the 2 weeks post procedure, please call and report this information to us.    If any biopsies were taken you will be contacted by phone or by letter within the next 1-3 weeks.  Please call us at [phone] if you have not heard about the biopsies in 3 weeks.    SIGNATURES/CONFIDENTIALITY: You and/or your care partner have signed paperwork which will be entered into your electronic medical record.  These signatures attest to the fact that that the information above on your After Visit Summary has been reviewed and is understood.  Full responsibility of the confidentiality of this discharge information lies with you and/or your care-partner. 

## 2018-11-06 NOTE — Progress Notes (Signed)
To PACU, VSS. Report to [email or web address] 

## 2018-11-06 NOTE — Op Note (Signed)
[facility] Patient Name: Shannon [name] Procedure Date: 11/06/2018 1:45 PM MRN: [id number] Endoscopist: Thornton Park MD, MD Age: 73 Referring MD:  Date of Birth: 11/06/1945 Gender: Female Account #: [id number] Procedure:                Colonoscopy Indications:              Rectal bleeding - - bright red blood with straining                           Rectal polyp on physical exam with PCP                           Intermittently black stools on iron                           - Labs 07/02/18: Hgb 12.5, MCV 89.9, RDW 13,                            platelets 246. BUN 13, crt 0.84                           - heme + stool 07/02/18                           Post-prandial cramping and defection                           Normal screening colonoscopy with Dr. Deatra Ina 2011                           No known family history of colon cancer or polyps Medicines:                See the Anesthesia note for documentation of the                            administered medications Procedure:                Pre-Anesthesia Assessment:                           - Prior to the procedure, a History and Physical                            was performed, and patient medications and                            allergies were reviewed. The patient's tolerance of                            previous anesthesia was also reviewed. The risks                            and benefits of the procedure and the sedation  options and risks were discussed with the patient.                            All questions were answered, and informed consent                            was obtained. Prior Anticoagulants: The patient has                            taken no previous anticoagulant or antiplatelet                            agents. ASA Grade Assessment: II - A patient with                            mild systemic disease. After reviewing the risks                            and benefits, the  patient was deemed in                            satisfactory condition to undergo the procedure.                           After obtaining informed consent, the colonoscope                            was passed under direct vision. Throughout the                            procedure, the patient's blood pressure, pulse, and                            oxygen saturations were monitored continuously. The                            LOANER 0255 was introduced through the anus and                            advanced to the the terminal ileum, with                            identification of the appendiceal orifice and IC                            valve. A second forward view of the right colon was                            performed. The colonoscopy was performed without                            difficulty. The patient tolerated the procedure  well. The quality of the bowel preparation was                            excellent. The terminal ileum, ileocecal valve,                            appendiceal orifice, and rectum were photographed. Scope In: 1:49:23 PM Scope Out: 2:05:04 PM Scope Withdrawal Time: 0 hours 12 minutes 17 seconds  Total Procedure Duration: 0 hours 15 minutes 41 seconds  Findings:                 Hemorrhoids were found on perianal exam.                           A 1 mm possible "polyp" was found in the ascending                            colon. The polyp was sessile. The polyp was removed                            with a cold biopsy forceps. Resection and retrieval                            were complete. Estimated blood loss: none.                           Anal papilla(e) were hypertrophied. This appears to                            correlate with the concern for a rectal polyp on                            digital rectal exam.                           The exam was otherwise without abnormality on                            direct and  retroflexion views. Complications:            No immediate complications. Estimated Blood Loss:     Estimated blood loss: none. Impression:               - Hemorrhoids found on perianal exam.                           - One 1 mm polyp in the ascending colon, removed                            with a cold biopsy forceps. Resected and retrieved.                           - Anal papilla(e) were hypertrophied.                           -  The examination was otherwise normal on direct                            and retroflexion views. Recommendation:           - Patient has a contact number available for                            emergencies. The signs and symptoms of potential                            delayed complications were discussed with the                            patient. Return to normal activities tomorrow.                            Written discharge instructions were provided to the                            patient.                           - Resume previous diet today.                           - Continue present medications.                           - Recommend drinking at least 64 ounces of water                            daily, using a daily stool bulking agent such as                            Metamucil or Benefiber.                           - Await pathology results.                           - Routine surveillance colonoscopy is not routinely                            performed after age 76.                           - Referral to Dr. Nadeen Landau to consider                            removal of suspected hypertrophied anal papillae. Thornton Park MD, MD 11/06/2018 2:16:08 PM This report has been signed electronically.

## 2018-11-07 ENCOUNTER — Telehealth: Payer: Self-pay | Admitting: *Deleted

## 2018-11-07 NOTE — Telephone Encounter (Signed)
Referral faxed to [facility] for Dr. Nadeen Landau to consider the removal of suspected hypertrophied anal papillae.

## 2018-11-08 ENCOUNTER — Telehealth: Payer: Self-pay

## 2018-11-08 NOTE — Telephone Encounter (Signed)
  Follow up Call-  Call back number 11/06/2018  Post procedure Call Back phone  # [phone]  Permission to leave phone message Yes  Some recent data might be hidden     Patient questions:  Do you have a fever, pain , or abdominal swelling? No. Pain Score  0 *  Have you tolerated food without any problems? Yes.    Have you been able to return to your normal activities? Yes.    Do you have any questions about your discharge instructions: Diet   No. Medications  No. Follow up visit  No.  Do you have questions or concerns about your Care? No.  Actions: * If pain score is 4 or above: No action needed, pain <4.  1. Have you developed a fever since your procedure? no  2.   Have you had an respiratory symptoms (SOB or cough) since your procedure? no 3.   Have you tested positive for COVID 19 since your procedure no  4.   Have you had any family members/close contacts diagnosed with the COVID 19 since your procedure?  no   If yes to any of these questions please route to Joylene John, RN and Alphonsa Gin, Therapist, sports.

## 2018-11-14 ENCOUNTER — Other Ambulatory Visit: Payer: Self-pay | Admitting: Family Medicine

## 2018-11-14 ENCOUNTER — Encounter: Payer: Self-pay | Admitting: Gastroenterology

## 2018-11-26 ENCOUNTER — Other Ambulatory Visit: Payer: Self-pay | Admitting: Family Medicine

## 2018-11-27 ENCOUNTER — Other Ambulatory Visit: Payer: Self-pay

## 2018-11-27 ENCOUNTER — Ambulatory Visit (INDEPENDENT_AMBULATORY_CARE_PROVIDER_SITE_OTHER): Payer: Medicare Other | Admitting: Podiatry

## 2018-11-27 DIAGNOSIS — L989 Disorder of the skin and subcutaneous tissue, unspecified: Secondary | ICD-10-CM | POA: Diagnosis not present

## 2018-12-02 ENCOUNTER — Telehealth: Payer: Self-pay | Admitting: *Deleted

## 2018-12-02 DIAGNOSIS — K5902 Outlet dysfunction constipation: Secondary | ICD-10-CM | POA: Diagnosis not present

## 2018-12-02 DIAGNOSIS — K6289 Other specified diseases of anus and rectum: Secondary | ICD-10-CM | POA: Diagnosis not present

## 2018-12-02 DIAGNOSIS — L290 Pruritus ani: Secondary | ICD-10-CM | POA: Diagnosis not present

## 2018-12-02 NOTE — Telephone Encounter (Signed)
Followed up with [facility], the patient is scheduled on 11/2 at 3:15 pm with Dr. Dema Severin for her consultation.

## 2018-12-02 NOTE — Progress Notes (Signed)
Subjective: 73 y.o. female presenting to the office today as a new patient with a chief complaint of painful callus lesions noted to the bilateral feet that have been present for the past two years. She states the left foot is worse than the right and walking increases the symptoms. She has not had any treatment for her complaint. Patient is here for further evaluation and treatment.   Past Medical History:  Diagnosis Date  . 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 Anemia 08/28/2011  . Asthma    as a child  . Brain tumor ([place])   . Breast cancer ([place])    right breast  . Carpal tunnel syndrome of left wrist   . Cataracts, bilateral 12/28/2015  . Constipation 03/03/2012  . DEPRESSION 08/11/2008  . Dermatitis   . Facial fracture ([place])   . GERD 08/11/2008  . H/O measles   . H/O mumps   . History of blood transfusion   . History of chicken pox   . History of hiatal hernia   . 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 HYPERLIPIDEMIA 08/11/2008  . HYPERTENSION 08/11/2008  . Hyponatremia 08/28/2011  . 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 Interstitial cystitis   . LIPOMA 02/10/2009  . Medicare annual wellness visit, subsequent 12/27/2014   Follows with Dr Amalia Hailey of urology Follows with Dr Posey Pronto at [facility] for opthamology Follows with Dr [name] of dermatology Follows with [facility] gastroenterology, last scope done in 2011 repeat in 10 years Last [facility] July 2015 should repeat every 1-2 years Last Pap December 2015, repeat in 3 years.   . Melanoma ([place]) 2008   DR. Ronnald Ramp  . Neuropathy   . NICM (nonischemic cardiomyopathy) ([place]) 03/08/2010   likely 2/2 chemotx - a. Echo 2012: EF 45-50%;  b. Lex MV 2/12:  low risk, apical defect (small area of ischemia vs shifting breast atten);  c.  Echo 7/12: Normal wall thickness, EF 60-65%, normal wall motion, grade 1 diastolic dysfunction, mild LAE, PASP 32;   d. Lex MV 11/13:  EF 76%, no  ischemia  . OA (osteoarthritis) 09/07/2011  . Obesity 09/28/2014  . Osteoporosis   . Personal history of chemotherapy 2012  . Personal history of radiation therapy 2012  . PREDIABETES 06/15/2009  . Recurrent falls 12/28/2015  . Shortness of breath   . UTI (lower urinary tract infection) 03/03/2012  . Vertebral fracture, osteoporotic, sequela 04/05/2016     Objective:  Physical Exam General: Alert and oriented x3 in no acute distress  Dermatology: Hyperkeratotic lesion(s) present on the bilateral feet. Pain on palpation with a central nucleated core noted. Skin is warm, dry and supple bilateral lower extremities. Negative for open lesions or macerations.  Vascular: Palpable pedal pulses bilaterally. No edema or erythema noted. Capillary refill within normal limits.  Neurological: Epicritic and protective threshold grossly intact bilaterally.   Musculoskeletal Exam: Pain on palpation at the keratotic lesion(s) noted. Range of motion within normal limits bilateral. Muscle strength 5/5 in all groups bilateral.  Assessment: 1. Porokeratosis bilateral x 2    Plan of Care:  1. Patient evaluated 2. Excisional debridement of keratoic lesion(s) using a chisel blade was performed without incident. Salinocaine applied.  3. Dressed area with light dressing. 4. Recommended good shoe gear.  5. Recommended OTC corn and callus remover.  6. Patient is to return to the clinic PRN.  Edrick Kins, DPM [facility]  Dr. Edrick Kins, [address]                                        [place], [place] [address]                Office [phone]  Fax [phone]

## 2019-01-01 ENCOUNTER — Ambulatory Visit: Payer: Medicare Other | Admitting: Physical Therapy

## 2019-02-03 DIAGNOSIS — H2513 Age-related nuclear cataract, bilateral: Secondary | ICD-10-CM | POA: Diagnosis not present

## 2019-02-05 NOTE — Progress Notes (Signed)
Virtual Visit via Video Note  I connected with patient on 02/06/19 at  1:45 PM EST by audio enabled telemedicine application and verified that I am speaking with the correct person using two identifiers.   THIS ENCOUNTER IS A VIRTUAL VISIT DUE TO COVID-19 - PATIENT WAS NOT SEEN IN THE OFFICE. PATIENT HAS CONSENTED TO VIRTUAL VISIT / TELEMEDICINE VISIT   Location of patient: home  Location of provider: office  I discussed the limitations of evaluation and management by telemedicine and the availability of in person appointments. The patient expressed understanding and agreed to proceed.   Subjective:   Shannon [name] is a 74 y.o. female who presents for Medicare Annual (Subsequent) preventive examination.  Pt's son recently passed due to Covid. Pt states she is starting grievance counseling w/ Hospice.   Review of Systems:  Home Safety/Smoke Alarms: Feels safe in home. Smoke alarms in place.  Lives in 4 story home.    Female:     Mammo- 09/12/18      Dexa scan-   01/10/18     CCS- 11/06/18.    Objective:     Vitals: Unable to assess. This visit is enabled though telemedicine due to Covid 19.   Advanced Directives 01/03/2018 10/16/2017 03/30/2016 02/27/2016 12/28/2015 05/17/2015 12/21/2014  Does Patient Have a Medical Advance Directive? _0  Yes Yes  Type of Advance Directive - [place];Living will Living will Living will;Healthcare Power of [place];Living will Healthcare Power of [place];Living will  Does patient want to make changes to medical advance directive? No - Patient declined - No - Patient declined - Yes (MAU/Ambulatory/Procedural Areas - Information given) No - Patient declined -  Copy of [place] in Chart? - No - copy requested - - No - copy requested No - copy requested No - copy requested  Pre-existing out of facility DNR order (yellow form or pink MOST form)  - - - - - - -    Tobacco Social History   Tobacco Use  Smoking Status Never Smoker  Smokeless Tobacco Never Used     Counseling given: Not Answered   Clinical Intake: Pain : No/denies pain     Past Medical History:  Diagnosis Date  . 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 Anemia 08/28/2011  . Asthma    as a child  . Brain tumor ([place])   . Breast cancer ([place])    right breast  . Carpal tunnel syndrome of left wrist   . Cataracts, bilateral 12/28/2015  . Constipation 03/03/2012  . DEPRESSION 08/11/2008  . Dermatitis   . Facial fracture ([place])   . GERD 08/11/2008  . H/O measles   . H/O mumps   . History of blood transfusion   . History of chicken pox   . History of hiatal hernia   . 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 HYPERLIPIDEMIA 08/11/2008  . HYPERTENSION 08/11/2008  . Hyponatremia 08/28/2011  . 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 Interstitial cystitis   . LIPOMA 02/10/2009  . Medicare annual wellness visit, subsequent 12/27/2014   Follows with Dr Amalia Hailey of urology Follows with Dr Posey Pronto at [facility] for opthamology Follows with Dr [name] of dermatology Follows with [facility]ology, last scope done in 2011 repeat in 10 years Last [facility] July 2015 should  repeat every 1-2 years Last Pap December 2015, repeat in 3 years.   . Melanoma ([place]) 2008   DR. Ronnald Ramp  . Neuropathy   . NICM (nonischemic cardiomyopathy) ([place]) 03/08/2010   likely 2/2 chemotx - a. Echo 2012: EF 45-50%;  b. Lex MV 2/12:  low risk, apical defect (small area of ischemia vs shifting breast atten);  c.  Echo 7/12: Normal wall thickness, EF 60-65%, normal wall motion, grade 1 diastolic dysfunction, mild LAE, PASP 32;   d. Lex MV 11/13:  EF 76%, no ischemia  . OA (osteoarthritis) 09/07/2011  . Obesity 09/28/2014  . Osteoporosis   . Personal history of chemotherapy 2012  . Personal history of radiation therapy 2012  . PREDIABETES  06/15/2009  . Recurrent falls 12/28/2015  . Shortness of breath   . UTI (lower urinary tract infection) 03/03/2012  . Vertebral fracture, osteoporotic, sequela 04/05/2016   Past Surgical History:  Procedure Laterality Date  . BREAST LUMPECTOMY  04/2009   RIGHT FOR BREAST CANCER-CHEMO/RADIATION X 1 YEAR  . BREAST REDUCTION SURGERY Bilateral 05/04/2014   Procedure: MAMMARY REDUCTION  (BREAST);  Surgeon: Cristine Polio, MD;  Location: [facility];  Service: Plastics;  Laterality: Bilateral;  . CARPAL TUNNEL RELEASE     L wrist, ulnar nerve moved  . CHOLECYSTECTOMY    . CRANIOTOMY FOR TUMOR  2000  . ELBOW SURGERY     left  . KNEE ARTHROSCOPY Left 10/12/2014   Procedure: LEFT KNEE ARTHROSCOPY ;  Surgeon: Gaynelle Arabian, MD;  Location: [facility];  Service: Orthopedics;  Laterality: Left;  . KYPHOPLASTY N/A 03/30/2016   Procedure: THORACIC 12 KYPHOPLASTY;  Surgeon: Phylliss Bob, MD;  Location: [facility];  Service: Orthopedics;  Laterality: N/A;  THORACIC 12 KYPHOPLASTY  . LEFT HEART CATHETERIZATION WITH CORONARY ANGIOGRAM N/A 12/16/2013   Procedure: LEFT HEART CATHETERIZATION WITH CORONARY ANGIOGRAM;  Surgeon: Peter M [name], MD;  Location: [facility] CATH LAB;  Service: Cardiovascular;  Laterality: N/A;  . LIPOMA EXCISION  03/28/2009   right leg  . MELANOMA EXCISION    . PORT-A-CATH REMOVAL  11/30/2010   [place]reck  . porta cath    . PORTACATH PLACEMENT  may 2011  . REDUCTION MAMMAPLASTY Bilateral   . SYNOVECTOMY Left 10/12/2014   Procedure: WITH SYNOVECTOMY;  Surgeon: Gaynelle Arabian, MD;  Location: [facility];  Service: Orthopedics;  Laterality: Left;  . TONSILLECTOMY  1958  . TOTAL KNEE ARTHROPLASTY  02/05/2012   Procedure: TOTAL KNEE ARTHROPLASTY;  Surgeon: Gearlean Alf, MD;  Location: [facility];  Service: Orthopedics;  Laterality: Right;  . TOTAL KNEE ARTHROPLASTY Left 02/10/2013   Procedure: LEFT TOTAL KNEE ARTHROPLASTY;  Surgeon: Gearlean Alf, MD;  Location: [facility];  Service: Orthopedics;   Laterality: Left;  . total knee raplacement  01-2012   Right Knee  . TUBAL LIGATION  1997   Family History  Problem Relation Age of Onset  . Breast cancer Mother        sarcoma  . Lung cancer Mother   . Hypertension Mother   . Prostate cancer Father   . Congestive Heart Failure Father   . Heart attack Father   . Prostate cancer Brother   . Down syndrome Son   . CVA Son   . Heart disease Maternal Grandfather        MI  . Stomach cancer Maternal Aunt   . Uterine cancer Maternal Aunt   . Colon cancer Neg Hx    Social History   Socioeconomic  History  . Marital status: Married    Spouse name: Deidre Ala  . Number of children: 3  . Years of education: Not on file  . Highest education level: Not on file  Occupational History  . Occupation: boutique owner-retired    Employer: [employer]  Tobacco Use  . Smoking status: Never Smoker  . Smokeless tobacco: Never Used  Substance and Sexual Activity  . Alcohol use: No  . Drug use: No  . Sexual activity: Not Currently  Other Topics Concern  . Not on file  Social History Narrative   Patient is right-handed. She lives with her husband in a 4 story home. They take care of their grown son with down syndrome who has had a stroke. She drinks 1-2 glasses of tea in the evenings. She does not formally exercise.   Social Determinants of Health   Financial Resource Strain:   . Difficulty of Paying Living Expenses: Not on file  Food Insecurity:   . Worried About Charity fundraiser in the Last Year: Not on file  . Ran Out of Food in the Last Year: Not on file  Transportation Needs:   . Lack of Transportation (Medical): Not on file  . Lack of Transportation (Non-Medical): Not on file  Physical Activity:   . Days of Exercise per Week: Not on file  . Minutes of Exercise per Session: Not on file  Stress:   . Feeling of Stress : Not on file  Social Connections:   . Frequency of Communication with Friends and Family: Not on file  .  Frequency of Social Gatherings with Friends and Family: Not on file  . Attends Religious Services: Not on file  . Active Member of Clubs or Organizations: Not on file  . Attends Archivist Meetings: Not on file  . Marital Status: Not on file    Outpatient Encounter Medications as of 02/06/2019  Medication Sig  . acetaminophen (TYLENOL) 500 MG tablet Take 500-1,000 mg by mouth every 6 (six) hours as needed for moderate pain or headache.   Marland Kitchen aspirin EC 81 MG tablet Take 81 mg by mouth daily.  . B Complex-C (SUPER B COMPLEX PO) Take 1 tablet by mouth daily.  . carvedilol (COREG) 25 MG tablet Take 0.5 tablets (12.5 mg total) by mouth 2 (two) times daily with a meal. Hold if your blood pressure is <120/80  . clonazePAM (KLONOPIN) 0.5 MG tablet Take 0.5-1 tablets (0.25-0.5 mg total) by mouth 2 (two) times daily as needed for anxiety.  . ferrous sulfate 325 (65 FE) MG tablet Take 325 mg by mouth daily with breakfast.  . folic acid (FOLVITE) 1 MG tablet TAKE 1 TABLET BY MOUTH EVERY DAY  . gabapentin (NEURONTIN) 300 MG capsule Take 1 capsule (300 mg total) by mouth 2 (two) times daily as needed.  . hydrOXYzine (ATARAX/VISTARIL) 25 MG tablet Take 25 mg by mouth at bedtime.  . mirabegron ER (MYRBETRIQ) 50 MG TB24 tablet Take 50 mg by mouth daily.  . Multiple Vitamin (MULTIVITAMIN WITH MINERALS) TABS tablet Take 1 tablet by mouth daily.  . pentosan polysulfate (ELMIRON) 100 MG capsule Take 100 mg by mouth 2 (two) times daily.   . rosuvastatin (CRESTOR) 20 MG tablet TAKE 1 TABLET BY MOUTH EVERYDAY AT BEDTIME  . telmisartan (MICARDIS) 80 MG tablet Take 0.5 tablets (40 mg total) by mouth at bedtime.  Marland Kitchen venlafaxine XR (EFFEXOR-XR) 75 MG 24 hr capsule Take 2 capsules (150 mg total) by mouth daily  with breakfast.  . dicyclomine (BENTYL) 20 MG tablet Take 1 tablet (20 mg total) by mouth 4 (four) times daily as needed for spasms. (Patient not taking: Reported on 02/06/2019)   No facility-administered  encounter medications on file as of 02/06/2019.    Activities of Daily Living No flowsheet data found.  Patient Care Team: Mosie Lukes, MD as PCP - General (Family Medicine) Domingo Pulse, MD as Consulting Physician (Urology) Calvert Cantor, MD as Consulting Physician (Ophthalmology) Stanford Breed Denice Bors, MD as Consulting Physician (Cardiology)    Assessment:   This is a routine wellness examination for Janeal. Physical assessment deferred to PCP.  Exercise Activities and Dietary recommendations   Diet (meal preparation, eat out, water intake, caffeinated beverages, dairy products, fruits and vegetables): well balanced    Goals    . DIET - EAT MORE FRUITS AND VEGETABLES    . DIET - INCREASE WATER INTAKE       Fall Risk Fall Risk  02/06/2019 12/25/2017 10/16/2017 10/16/2017 12/28/2015  Falls in the past year? 1 1 No No Yes  Number falls in past yr: 1 0 - - 2 or more  Injury with Fall? 1 0 - - Yes  Comment - - - - Cracked bone around her eye, Bruised her side. Rushing out the garage and slipped and fell.  Risk Factor Category  - - - - -  Risk for fall due to : History of fall(s) - - - -  Follow up Education provided;Falls prevention discussed Falls evaluation completed - - -   Depression Screen PHQ 2/9 Scores 02/06/2019 10/16/2017 10/16/2017 12/28/2015  PHQ - 2 Score 5 0 0 6  PHQ- 9 Score 17 - - 12     Cognitive Function Ad8 score reviewed for issues:  Issues making decisions:no  Less interest in hobbies / activities:no  Repeats questions, stories (family complaining):no  Trouble using ordinary gadgets (microwave, computer, phone):no  Forgets the month or year: no  Mismanaging finances: no  Remembering appts:no  Daily problems with thinking and/or memory:no Ad8 score is=0        Immunization History  Administered Date(s) Administered  . Influenza Split 11/11/2010, 11/14/2011  . Influenza Whole 02/10/2009  . Influenza, High Dose Seasonal PF 11/04/2015,  10/16/2017  . Influenza,inj,Quad PF,6+ Mos 10/18/2012, 10/10/2013, 12/21/2014  . Influenza-Unspecified 08/30/2016  . Pneumococcal Conjugate-13 12/21/2014  . Pneumococcal Polysaccharide-23 09/04/2011  . Td 01/30/2001, 12/28/2015    Screening Tests Health Maintenance  Topic Date Due  . INFLUENZA VACCINE  08/31/2018  . MAMMOGRAM  09/10/2020  . TETANUS/TDAP  12/27/2025  . COLONOSCOPY  11/05/2028  . DEXA SCAN  Completed  . Hepatitis C Screening  Completed  . PNA vac Low Risk Adult  Completed     Plan:   See you next year!  Continue to eat heart healthy diet (full of fruits, vegetables, whole grains, lean protein, water--limit salt, fat, and sugar intake) and increase physical activity as tolerated.  Continue doing brain stimulating activities (puzzles, reading, adult coloring books, staying active) to keep memory sharp.   Bring a copy of your living will and/or healthcare power of attorney to your next office visit.   I have personally reviewed and noted the following in the patient's chart:   . Medical and social history . Use of alcohol, tobacco or illicit drugs  . Current medications and supplements . Functional ability and status . Nutritional status . Physical activity . Advanced directives . List of other physicians . Hospitalizations, surgeries,  and ER visits in previous 12 months . Vitals . Screenings to include cognitive, depression, and falls . Referrals and appointments  In addition, I have reviewed and discussed with patient certain preventive protocols, quality metrics, and best practice recommendations. A written personalized care plan for preventive services as well as general preventive health recommendations were provided to patient.     Shela Nevin, [place]  02/06/2019

## 2019-02-06 ENCOUNTER — Other Ambulatory Visit: Payer: Self-pay | Admitting: Family Medicine

## 2019-02-06 ENCOUNTER — Ambulatory Visit (INDEPENDENT_AMBULATORY_CARE_PROVIDER_SITE_OTHER): Payer: Medicare Other | Admitting: *Deleted

## 2019-02-06 ENCOUNTER — Other Ambulatory Visit: Payer: Self-pay

## 2019-02-06 ENCOUNTER — Encounter: Payer: Self-pay | Admitting: *Deleted

## 2019-02-06 DIAGNOSIS — Z0000 Encounter for general adult medical examination without abnormal findings: Secondary | ICD-10-CM | POA: Diagnosis not present

## 2019-02-06 NOTE — Patient Instructions (Signed)
See you next year!  Continue to eat heart healthy diet (full of fruits, vegetables, whole grains, lean protein, water--limit salt, fat, and sugar intake) and increase physical activity as tolerated.  Continue doing brain stimulating activities (puzzles, reading, adult coloring books, staying active) to keep memory sharp.   Bring a copy of your living will and/or healthcare power of attorney to your next office visit.   Ms. Shannon [name] , Thank you for taking time to come for your Medicare Wellness Visit. I appreciate your ongoing commitment to your health goals. Please review the following plan we discussed and let me know if I can assist you in the future.   These are the goals we discussed: Goals    . Complete grievance counseling       This is a list of the screening recommended for you and due dates:  Health Maintenance  Topic Date Due  . Flu Shot  08/31/2018  . Mammogram  09/10/2020  . Tetanus Vaccine  12/27/2025  . Colon Cancer Screening  11/05/2028  . DEXA scan (bone density measurement)  Completed  .  Hepatitis C: One time screening is recommended by Center for Disease Control  (CDC) for  adults born from [birth year] through 1966.   Completed  . Pneumonia vaccines  Completed    Preventive Care 75 Years and Older, Female Preventive care refers to lifestyle choices and visits with your health care provider that can promote health and wellness. This includes:  A yearly physical exam. This is also called an annual well check.  Regular dental and eye exams.  Immunizations.  Screening for certain conditions.  Healthy lifestyle choices, such as diet and exercise. What can I expect for my preventive care visit? Physical exam Your health care provider will check:  Height and weight. These may be used to calculate body mass index (BMI), which is a measurement that tells if you are at a healthy weight.  Heart rate and blood pressure.  Your skin for abnormal spots. Counseling Your  health care provider may ask you questions about:  Alcohol, tobacco, and drug use.  Emotional well-being.  Home and relationship well-being.  Sexual activity.  Eating habits.  History of falls.  Memory and ability to understand (cognition).  Work and work Statistician.  Pregnancy and menstrual history. What immunizations do I need?  Influenza (flu) vaccine  This is recommended every year. Tetanus, diphtheria, and pertussis (Tdap) vaccine  You may need a Td booster every 10 years. Varicella (chickenpox) vaccine  You may need this vaccine if you have not already been vaccinated. Zoster (shingles) vaccine  You may need this after age 75. Pneumococcal conjugate (PCV13) vaccine  One dose is recommended after age 30. Pneumococcal polysaccharide (PPSV23) vaccine  One dose is recommended after age 22. Measles, mumps, and rubella (MMR) vaccine  You may need at least one dose of MMR if you were born in 1957 or later. You may also need a second dose. Meningococcal conjugate (MenACWY) vaccine  You may need this if you have certain conditions. Hepatitis A vaccine  You may need this if you have certain conditions or if you travel or work in places where you may be exposed to hepatitis A. Hepatitis B vaccine  You may need this if you have certain conditions or if you travel or work in places where you may be exposed to hepatitis B. Haemophilus influenzae type b (Hib) vaccine  You may need this if you have certain conditions. You may receive vaccines  as individual doses or as more than one vaccine together in one shot (combination vaccines). Talk with your health care provider about the risks and benefits of combination vaccines. What tests do I need? Blood tests  Lipid and cholesterol levels. These may be checked every 5 years, or more frequently depending on your overall health.  Hepatitis C test.  Hepatitis B test. Screening  Lung cancer screening. You may have this  screening every year starting at age 55 if you have a 30-pack-year history of smoking and currently smoke or have quit within the past 15 years.  Colorectal cancer screening. All adults should have this screening starting at age 50 and continuing until age 75. Your health care provider may recommend screening at age 45 if you are at increased risk. You will have tests every 1-10 years, depending on your results and the type of screening test.  Diabetes screening. This is done by checking your blood sugar (glucose) after you have not eaten for a while (fasting). You may have this done every 1-3 years.  Mammogram. This may be done every 1-2 years. Talk with your health care provider about how often you should have regular mammograms.  BRCA-related cancer screening. This may be done if you have a family history of breast, ovarian, tubal, or peritoneal cancers. Other tests  Sexually transmitted disease (STD) testing.  Bone density scan. This is done to screen for osteoporosis. You may have this done starting at age 65. Follow these instructions at home: Eating and drinking  Eat a diet that includes fresh fruits and vegetables, whole grains, lean protein, and low-fat dairy products. Limit your intake of foods with high amounts of sugar, saturated fats, and salt.  Take vitamin and mineral supplements as recommended by your health care provider.  Do not drink alcohol if your health care provider tells you not to drink.  If you drink alcohol: ? Limit how much you have to 0-1 drink a day. ? Be aware of how much alcohol is in your drink. In the [place] one drink equals one 12 oz bottle of beer (355 mL), one 5 oz glass of wine (148 mL), or one 1 oz glass of hard liquor (44 mL). Lifestyle  Take daily care of your teeth and gums.  Stay active. Exercise for at least 30 minutes on 5 or more days each week.  Do not use any products that contain nicotine or tobacco, such as cigarettes, e-cigarettes,  and chewing tobacco. If you need help quitting, ask your health care provider.  If you are sexually active, practice safe sex. Use a condom or other form of protection in order to prevent STIs (sexually transmitted infections).  Talk with your health care provider about taking a low-dose aspirin or statin. What's next?  Go to your health care provider once a year for a well check visit.  Ask your health care provider how often you should have your eyes and teeth checked.  Stay up to date on all vaccines. This information is not intended to replace advice given to you by your health care provider. Make sure you discuss any questions you have with your health care provider. Document Revised: 01/10/2018 Document Reviewed: 01/10/2018 Elsevier Patient Education  2020 Elsevier Inc.  

## 2019-02-08 ENCOUNTER — Other Ambulatory Visit: Payer: Self-pay | Admitting: Family Medicine

## 2019-02-27 DIAGNOSIS — H57813 Brow ptosis, bilateral: Secondary | ICD-10-CM | POA: Diagnosis not present

## 2019-02-27 DIAGNOSIS — H02834 Dermatochalasis of left upper eyelid: Secondary | ICD-10-CM | POA: Diagnosis not present

## 2019-02-27 DIAGNOSIS — H0279 Other degenerative disorders of eyelid and periocular area: Secondary | ICD-10-CM | POA: Diagnosis not present

## 2019-02-27 DIAGNOSIS — H53483 Generalized contraction of visual field, bilateral: Secondary | ICD-10-CM | POA: Diagnosis not present

## 2019-02-27 DIAGNOSIS — H02413 Mechanical ptosis of bilateral eyelids: Secondary | ICD-10-CM | POA: Diagnosis not present

## 2019-02-27 DIAGNOSIS — H02831 Dermatochalasis of right upper eyelid: Secondary | ICD-10-CM | POA: Diagnosis not present

## 2019-03-02 ENCOUNTER — Other Ambulatory Visit: Payer: Self-pay | Admitting: Family Medicine

## 2019-03-03 DIAGNOSIS — H53483 Generalized contraction of visual field, bilateral: Secondary | ICD-10-CM | POA: Diagnosis not present

## 2019-03-03 DIAGNOSIS — H53481 Generalized contraction of visual field, right eye: Secondary | ICD-10-CM | POA: Diagnosis not present

## 2019-03-03 DIAGNOSIS — H53482 Generalized contraction of visual field, left eye: Secondary | ICD-10-CM | POA: Diagnosis not present

## 2019-03-28 ENCOUNTER — Emergency Department (HOSPITAL_BASED_OUTPATIENT_CLINIC_OR_DEPARTMENT_OTHER)
Admission: EM | Admit: 2019-03-28 | Discharge: 2019-03-28 | Disposition: A | Discharge status: Home / Self Care | Payer: Medicare Other | Attending: Emergency Medicine | Admitting: Emergency Medicine

## 2019-03-28 ENCOUNTER — Emergency Department (HOSPITAL_BASED_OUTPATIENT_CLINIC_OR_DEPARTMENT_OTHER): Payer: Medicare Other

## 2019-03-28 ENCOUNTER — Encounter (HOSPITAL_BASED_OUTPATIENT_CLINIC_OR_DEPARTMENT_OTHER): Payer: Self-pay

## 2019-03-28 ENCOUNTER — Other Ambulatory Visit: Payer: Self-pay

## 2019-03-28 DIAGNOSIS — Y999 Unspecified external cause status: Secondary | ICD-10-CM | POA: Insufficient documentation

## 2019-03-28 DIAGNOSIS — S0083XA Contusion of other part of head, initial encounter: Secondary | ICD-10-CM | POA: Diagnosis not present

## 2019-03-28 DIAGNOSIS — Z853 Personal history of malignant neoplasm of breast: Secondary | ICD-10-CM | POA: Insufficient documentation

## 2019-03-28 DIAGNOSIS — W1839XA Other fall on same level, initial encounter: Secondary | ICD-10-CM | POA: Diagnosis not present

## 2019-03-28 DIAGNOSIS — Y9259 Other trade areas as the place of occurrence of the external cause: Secondary | ICD-10-CM | POA: Insufficient documentation

## 2019-03-28 DIAGNOSIS — Y9389 Activity, other specified: Secondary | ICD-10-CM | POA: Diagnosis not present

## 2019-03-28 DIAGNOSIS — E785 Hyperlipidemia, unspecified: Secondary | ICD-10-CM | POA: Diagnosis not present

## 2019-03-28 DIAGNOSIS — Z885 Allergy status to narcotic agent status: Secondary | ICD-10-CM | POA: Diagnosis not present

## 2019-03-28 DIAGNOSIS — S0093XA Contusion of unspecified part of head, initial encounter: Secondary | ICD-10-CM | POA: Diagnosis not present

## 2019-03-28 DIAGNOSIS — Z7982 Long term (current) use of aspirin: Secondary | ICD-10-CM | POA: Insufficient documentation

## 2019-03-28 DIAGNOSIS — S0990XA Unspecified injury of head, initial encounter: Secondary | ICD-10-CM | POA: Diagnosis present

## 2019-03-28 DIAGNOSIS — W19XXXA Unspecified fall, initial encounter: Secondary | ICD-10-CM

## 2019-03-28 DIAGNOSIS — R7303 Prediabetes: Secondary | ICD-10-CM | POA: Insufficient documentation

## 2019-03-28 DIAGNOSIS — S199XXA Unspecified injury of neck, initial encounter: Secondary | ICD-10-CM | POA: Diagnosis not present

## 2019-03-28 DIAGNOSIS — I10 Essential (primary) hypertension: Secondary | ICD-10-CM | POA: Diagnosis not present

## 2019-03-28 DIAGNOSIS — Z79899 Other long term (current) drug therapy: Secondary | ICD-10-CM | POA: Diagnosis not present

## 2019-03-28 DIAGNOSIS — S0003XA Contusion of scalp, initial encounter: Secondary | ICD-10-CM | POA: Diagnosis not present

## 2019-03-28 MED ORDER — ACETAMINOPHEN 325 MG PO TABS
650.0000 mg | ORAL_TABLET | Freq: Once | ORAL | Status: AC
  Administered 2019-03-28: 650 mg via ORAL
  Filled 2019-03-28: qty 2

## 2019-03-28 NOTE — Discharge Instructions (Addendum)
The CT of your head and cervical spine were normal today.  You may continue to apply ice to the area along with alternate tylenol or ibuprofen for pain.   Follow up with your primary care physician as needed.

## 2019-03-28 NOTE — ED Notes (Signed)
Bump to back of her head.  Patient is alert and oriented.

## 2019-03-28 NOTE — ED Notes (Signed)
Daughter, Steffanie Dunn, called and want some information about the patient.    I asked the patient if I can give an information to [facility] and patient stated that it is okay to give her the information.  Spoke with Steffanie Dunn and informed her that I will let the EDP to give her a call once the result is in.

## 2019-03-28 NOTE — ED Notes (Signed)
ED Provider at bedside. 

## 2019-03-28 NOTE — ED Triage Notes (Signed)
Pt arrives with reports of a fall today, states she was getting in a elevator and the doors started to close and she fell backwards hitting her head. Pt denies blood thinners, denies LOC. Pt does have a hematoma to upper head.

## 2019-03-28 NOTE — ED Provider Notes (Signed)
[facility] EMERGENCY DEPARTMENT Provider Note   CSN: [id number] Arrival date & time: 03/28/19  1529     History Chief Complaint  Patient presents with  . Fall    Shannon [name] is a 74 y.o. female.  74 y.o female with a PMH of HTN, Asthma, Brain tumor presents to the ED with a chief complaint of mechanical fall x prior to arrival. Patient reports she is currently living in a hotel due to damage to her home when she was trying to head the elevator but the doors close therefore she jumped back and fell hitting her the back of her head on the elevator. She reports pain to the back of her head around the hematoma. She was able to stand from the floor and ambulate back to her room. She reports no loss of conciousness, not on any blood thinners, no nausea, lightheaded or dizzy.   The history is provided by the patient and medical records.  Fall Associated symptoms include headaches. Pertinent negatives include no chest pain and no shortness of breath.       Past Medical History:  Diagnosis Date  . 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 Anemia 08/28/2011  . Asthma    as a child  . Brain tumor ([place])   . Breast cancer ([place])    right breast  . Carpal tunnel syndrome of left wrist   . Cataracts, bilateral 12/28/2015  . Constipation 03/03/2012  . DEPRESSION 08/11/2008  . Dermatitis   . Facial fracture ([place])   . GERD 08/11/2008  . H/O measles   . H/O mumps   . History of blood transfusion   . History of chicken pox   . History of hiatal hernia   . 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 HYPERLIPIDEMIA 08/11/2008  . HYPERTENSION 08/11/2008  . Hyponatremia 08/28/2011  . 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 Interstitial cystitis   . LIPOMA 02/10/2009  . Medicare annual wellness visit, subsequent 12/27/2014   Follows with Dr Amalia Hailey of urology Follows with Dr Posey Pronto at [facility] for opthamology  Follows with Dr [name] of dermatology Follows with [facility] gastroenterology, last scope done in 2011 repeat in 10 years Last [facility] July 2015 should repeat every 1-2 years Last Pap December 2015, repeat in 3 years.   . Melanoma ([place]) 2008   DR. Ronnald Ramp  . Neuropathy   . NICM (nonischemic cardiomyopathy) ([place]) 03/08/2010   likely 2/2 chemotx - a. Echo 2012: EF 45-50%;  b. Lex MV 2/12:  low risk, apical defect (small area of ischemia vs shifting breast atten);  c.  Echo 7/12: Normal wall thickness, EF 60-65%, normal wall motion, grade 1 diastolic dysfunction, mild LAE, PASP 32;   d. Lex MV 11/13:  EF 76%, no ischemia  . OA (osteoarthritis) 09/07/2011  . Obesity 09/28/2014  . Osteoporosis   . Personal history of chemotherapy 2012  . Personal history of radiation therapy 2012  . PREDIABETES 06/15/2009  . Recurrent falls 12/28/2015  . Shortness of breath   . UTI (lower urinary tract infection) 03/03/2012  . Vertebral fracture, osteoporotic, sequela 04/05/2016    Patient Active Problem List   Diagnosis Date Noted  . Other headache syndrome 12/23/2017  . Debility 12/23/2017  . Left knee pain 12/23/2017  . Closed fracture of maxilla, initial encounter ([place]) 10/17/2017  . Vitamin D deficiency 06/14/2017  .  Vertebral fracture, osteoporotic, sequela 04/05/2016  . Left-sided back pain 03/06/2016  . Chest pain 02/27/2016  . Syncope 02/27/2016  . Cataracts, bilateral 12/28/2015  . Recurrent falls 12/28/2015  . Genetic testing 11/29/2015  . Family history- stomach cancer 09/08/2015  . Family history of cancer 09/08/2015  . [name] lesion 06/21/2015  . Left leg swelling 05/24/2015  . Medicare annual wellness visit, subsequent 12/27/2014  . 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 Obesity 09/28/2014  . Interstitial cystitis 09/28/2014  . Pain in joint, ankle and foot 09/28/2014  . Abnormal nuclear stress test 12/16/2013  . Sternal pain 11/06/2013  . Postoperative anemia  due to acute blood loss 02/13/2013  . Preop cardiovascular exam 12/30/2012  . 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 Breast cancer of lower-inner quadrant of right female breast ([place]) 10/21/2012  . Insomnia 10/18/2012  . UTI (urinary tract infection) 03/03/2012  . Constipation 03/03/2012  . OA (osteoarthritis) 09/07/2011  . Dermatitis   . Hyponatremia 08/28/2011  . Osteoporosis 03/07/2011  . IC (interstitial cystitis)   . Melanoma ([place])   . Craniopharyngioma ([place]) 03/25/2010  . Prediabetes 06/15/2009  . LIPOMA 02/10/2009  . Dyspnea on exertion 02/10/2009  . Hyperlipidemia, mixed 08/11/2008  . Depression with anxiety 08/11/2008  . Essential hypertension 08/11/2008    Past Surgical History:  Procedure Laterality Date  . BREAST LUMPECTOMY  04/2009   RIGHT FOR BREAST CANCER-CHEMO/RADIATION X 1 YEAR  . BREAST REDUCTION SURGERY Bilateral 05/04/2014   Procedure: MAMMARY REDUCTION  (BREAST);  Surgeon: Cristine Polio, MD;  Location: [facility];  Service: Plastics;  Laterality: Bilateral;  . CARPAL TUNNEL RELEASE     L wrist, ulnar nerve moved  . CHOLECYSTECTOMY    . CRANIOTOMY FOR TUMOR  2000  . ELBOW SURGERY     left  . KNEE ARTHROSCOPY Left 10/12/2014   Procedure: LEFT KNEE ARTHROSCOPY ;  Surgeon: Gaynelle Arabian, MD;  Location: [facility];  Service: Orthopedics;  Laterality: Left;  . KYPHOPLASTY N/A 03/30/2016   Procedure: THORACIC 12 KYPHOPLASTY;  Surgeon: Phylliss Bob, MD;  Location: [facility];  Service: Orthopedics;  Laterality: N/A;  THORACIC 12 KYPHOPLASTY  . LEFT HEART CATHETERIZATION WITH CORONARY ANGIOGRAM N/A 12/16/2013   Procedure: LEFT HEART CATHETERIZATION WITH CORONARY ANGIOGRAM;  Surgeon: Peter M [name], MD;  Location: [facility];  Service: Cardiovascular;  Laterality: N/A;  . LIPOMA EXCISION  03/28/2009   right leg  . MELANOMA EXCISION    . PORT-A-CATH REMOVAL  11/30/2010   [place]  . porta cath    . PORTACATH PLACEMENT  may 2011  . REDUCTION  MAMMAPLASTY Bilateral   . SYNOVECTOMY Left 10/12/2014   Procedure: WITH SYNOVECTOMY;  Surgeon: Gaynelle Arabian, MD;  Location: [facility];  Service: Orthopedics;  Laterality: Left;  . TONSILLECTOMY  1958  . TOTAL KNEE ARTHROPLASTY  02/05/2012   Procedure: TOTAL KNEE ARTHROPLASTY;  Surgeon: Gearlean Alf, MD;  Location: [facility];  Service: Orthopedics;  Laterality: Right;  . TOTAL KNEE ARTHROPLASTY Left 02/10/2013   Procedure: LEFT TOTAL KNEE ARTHROPLASTY;  Surgeon: Gearlean Alf, MD;  Location: [facility];  Service: Orthopedics;  Laterality: Left;  . total knee raplacement  01-2012   Right Knee  . TUBAL LIGATION  1997     OB History    Gravida  3   Para  3   Term      Preterm      AB      Living  3  SAB      TAB      Ectopic      Multiple      Live Births              Family History  Problem Relation Age of Onset  . Breast cancer Mother        sarcoma  . Lung cancer Mother   . Hypertension Mother   . Prostate cancer Father   . Congestive Heart Failure Father   . Heart attack Father   . Prostate cancer Brother   . Down syndrome Son   . CVA Son   . Heart disease Maternal Grandfather        MI  . Stomach cancer Maternal Aunt   . Uterine cancer Maternal Aunt   . Colon cancer Neg Hx     Social History   Tobacco Use  . Smoking status: Never Smoker  . Smokeless tobacco: Never Used  Substance Use Topics  . Alcohol use: No  . Drug use: No    Home Medications Prior to Admission medications   Medication Sig Start Date End Date Taking? Authorizing Provider  acetaminophen (TYLENOL) 500 MG tablet Take 500-1,000 mg by mouth every 6 (six) hours as needed for moderate pain or headache.     [provider]  aspirin EC 81 MG tablet Take 81 mg by mouth daily.    [provider]  B Complex-C (SUPER B COMPLEX PO) Take 1 tablet by mouth daily.    [provider]  carvedilol (COREG) 25 MG tablet TAKE 1 TABLET BY MOUTH TWICE A DAY WITH FOOD  02/10/19   Mosie Lukes, MD  clonazePAM (KLONOPIN) 0.5 MG tablet Take 0.5-1 tablets (0.25-0.5 mg total) by mouth 2 (two) times daily as needed for anxiety. 06/07/18   Mosie Lukes, MD  dicyclomine (BENTYL) 20 MG tablet Take 1 tablet (20 mg total) by mouth 4 (four) times daily as needed for spasms. Patient not taking: Reported on 02/06/2019 10/14/18   Thornton Park, MD  ferrous sulfate 325 (65 FE) MG tablet Take 325 mg by mouth daily with breakfast.    [provider]  folic acid (FOLVITE) 1 MG tablet TAKE 1 TABLET BY MOUTH EVERY DAY 11/26/18   Mosie Lukes, MD  gabapentin (NEURONTIN) 300 MG capsule Take 1 capsule (300 mg total) by mouth 2 (two) times daily as needed. 06/07/18   Mosie Lukes, MD  hydrOXYzine (ATARAX/VISTARIL) 25 MG tablet Take 25 mg by mouth at bedtime.    [provider]  mirabegron ER (MYRBETRIQ) 50 MG TB24 tablet Take 50 mg by mouth daily. 10/15/18   [provider]  Multiple Vitamin (MULTIVITAMIN WITH MINERALS) TABS tablet Take 1 tablet by mouth daily.    [provider]  pentosan polysulfate (ELMIRON) 100 MG capsule Take 100 mg by mouth 2 (two) times daily.  02/07/12   Perkins, Alexzandrew L, PA-C  rosuvastatin (CRESTOR) 20 MG tablet TAKE 1 TABLET BY MOUTH EVERYDAY AT BEDTIME 11/14/18   Mosie Lukes, MD  telmisartan (MICARDIS) 80 MG tablet TAKE 1 TABLET BY MOUTH EVERY DAY 03/03/19   Mosie Lukes, MD  venlafaxine XR (EFFEXOR-XR) 75 MG 24 hr capsule TAKE 2 CAPSULES (150 MG TOTAL) BY MOUTH DAILY WITH BREAKFAST. 02/07/19   Mosie Lukes, MD    Allergies    Dilaudid [hydromorphone hcl]  Review of Systems   Review of Systems  Respiratory: Negative for shortness of breath.   Cardiovascular:  Negative for chest pain.  Skin: Positive for color change.  Neurological: Positive for headaches. Negative for dizziness, syncope and weakness.    Physical Exam Updated Vital Signs BP 140/83 (BP Location: Left Arm)   Pulse 64   Temp 97.9 F  (36.6 C) (Oral)   Resp 18   Ht '5\' 5"'  (1.651 m)   Wt 74.8 kg   LMP 01/30/1994   SpO2 98%   BMI 27.46 kg/m   Physical Exam Vitals and nursing note reviewed.  Constitutional:      General: She is not in acute distress.    Appearance: Normal appearance. She is well-developed.  HENT:     Head: Normocephalic.      Comments: 2 cm in diameter goose egg noted on the middle parietal area.     Mouth/Throat:     Pharynx: No oropharyngeal exudate.  Eyes:     Pupils: Pupils are equal, round, and reactive to light.  Cardiovascular:     Rate and Rhythm: Regular rhythm.     Heart sounds: Normal heart sounds.  Pulmonary:     Effort: Pulmonary effort is normal. No respiratory distress.     Breath sounds: Normal breath sounds.  Abdominal:     General: Bowel sounds are normal. There is no distension.     Palpations: Abdomen is soft.     Tenderness: There is no abdominal tenderness.  Musculoskeletal:        General: No tenderness or deformity.     Cervical back: Normal range of motion.     Right lower leg: No edema.     Left lower leg: No edema.  Skin:    General: Skin is warm and dry.  Neurological:     Mental Status: She is alert and oriented to person, place, and time.     ED Results / Procedures / Treatments   Labs (all labs ordered are listed, but only abnormal results are displayed) Labs Reviewed - No data to display  EKG None  Radiology CT Head Wo Contrast  Result Date: 03/28/2019 CLINICAL DATA:  [place] backwards, struck head EXAM: CT HEAD WITHOUT CONTRAST CT CERVICAL SPINE WITHOUT CONTRAST TECHNIQUE: Multidetector CT imaging of the head and cervical spine was performed following the standard protocol without intravenous contrast. Multiplanar CT image reconstructions of the cervical spine were also generated. COMPARISON:  12/14/2017 FINDINGS: CT HEAD FINDINGS Brain: No evidence of acute infarction, hemorrhage, hydrocephalus, extra-axial collection or mass lesion/mass effect.  Periventricular white matter hypodensity. Vascular: No hyperdense vessel or unexpected calcification. Skull: Status post right frontal craniotomy. Negative for fracture or focal lesion. Sinuses/Orbits: No acute finding. Other: Soft tissue contusion and hematoma of the scalp vertex. CT CERVICAL SPINE FINDINGS Alignment: Normal. Skull base and vertebrae: No acute fracture. No primary bone lesion or focal pathologic process. Soft tissues and spinal canal: No prevertebral fluid or swelling. No visible canal hematoma. Disc levels: Mild multilevel disc space height loss and osteophytosis. Upper chest: Negative. Other: None. IMPRESSION: 1. No acute intracranial pathology. 2. Soft tissue contusion and hematoma of the scalp vertex. 3. Status post right frontal craniotomy. 4. No fracture or static subluxation of the cervical spine. Electronically Signed   By: Eddie Candle M.D.   On: 03/28/2019 17:02   CT Cervical Spine Wo Contrast  Result Date: 03/28/2019 CLINICAL DATA:  [place] backwards, struck head EXAM: CT HEAD WITHOUT CONTRAST CT CERVICAL SPINE WITHOUT CONTRAST TECHNIQUE: Multidetector CT imaging of the head and cervical spine was performed following the standard protocol  without intravenous contrast. Multiplanar CT image reconstructions of the cervical spine were also generated. COMPARISON:  12/14/2017 FINDINGS: CT HEAD FINDINGS Brain: No evidence of acute infarction, hemorrhage, hydrocephalus, extra-axial collection or mass lesion/mass effect. Periventricular white matter hypodensity. Vascular: No hyperdense vessel or unexpected calcification. Skull: Status post right frontal craniotomy. Negative for fracture or focal lesion. Sinuses/Orbits: No acute finding. Other: Soft tissue contusion and hematoma of the scalp vertex. CT CERVICAL SPINE FINDINGS Alignment: Normal. Skull base and vertebrae: No acute fracture. No primary bone lesion or focal pathologic process. Soft tissues and spinal canal: No prevertebral fluid or  swelling. No visible canal hematoma. Disc levels: Mild multilevel disc space height loss and osteophytosis. Upper chest: Negative. Other: None. IMPRESSION: 1. No acute intracranial pathology. 2. Soft tissue contusion and hematoma of the scalp vertex. 3. Status post right frontal craniotomy. 4. No fracture or static subluxation of the cervical spine. Electronically Signed   By: Eddie Candle M.D.   On: 03/28/2019 17:02    Procedures Procedures (including critical care time)  Medications Ordered in ED Medications  acetaminophen (TYLENOL) tablet 650 mg (650 mg Oral Given 03/28/19 1638)    ED Course  I have reviewed the triage vital signs and the nursing notes.  Pertinent labs & imaging results that were available during my care of the patient were reviewed by me and considered in my medical decision making (see chart for details).    MDM Rules/Calculators/A&P   Patient manifested by medical history but currently not any blood thinners presents to the ED status post mechanical fall.  She reports attempting to do the elevator when the door suddenly closed which caused her to jump back hitting the back of her head on the elevator.  She reports she fell to the ground however was able to stand up.  Did not lose consciousness, she reports no dizziness, nausea, vomiting after the incident.  She does report pain to the back of her head, does have a goose egg about 2 cm diameter.  No abrasion, laceration noted to the area.  She is neurologically intact, ambulatory with a steady gait in the ED without any symptoms.  My evaluation of he is overall well-appearing, vitals are within normal limits, she is neurologically intact, we discussed CT imaging risks and benefits of the study.  We will go ahead and obtain a CT head along with neck as patient does report with slashing her head against the back of the elevator.  She is provided with Tylenol along with ice to help with her hematoma.  CT HEAD/Cspine: 1. No  acute intracranial pathology.  2. Soft tissue contusion and hematoma of the scalp vertex.  3. Status post right frontal craniotomy.  4. No fracture or static subluxation of the cervical spine.       These results were discussed with patient by my supervising physician Dr. Roslynn Amble. She is otherwise well appearing, in stable condition. Return precautions discussed at length.   Portions of this note were generated with [employer]. Dictation errors may occur despite best attempts at proofreading.  Final Clinical Impression(s) / ED Diagnoses Final diagnoses:  Fall, initial encounter  Traumatic hematoma of head, initial encounter    Rx / DC Orders ED Discharge Orders    None       Janeece Fitting, Hershal Coria 03/28/19 1716    Lucrezia Starch, MD 03/28/19 [id number]    Lucrezia Starch, MD 03/28/19 2342

## 2019-03-30 ENCOUNTER — Ambulatory Visit: Payer: Medicare Other | Attending: Internal Medicine

## 2019-03-30 DIAGNOSIS — Z23 Encounter for immunization: Secondary | ICD-10-CM | POA: Insufficient documentation

## 2019-03-30 NOTE — Progress Notes (Signed)
   Covid-19 Vaccination Clinic  Name:  Shannon [name]    MRN: [id number] DOB: 1945-03-06  03/30/2019  [name] was observed post Covid-19 immunization for 15 minutes without incidence. She was provided with Vaccine Information Sheet and instruction to access the V-Safe system.   [name] was instructed to call 911 with any severe reactions post vaccine: Marland Kitchen Difficulty breathing  . Swelling of your face and throat  . A fast heartbeat  . A bad rash all over your body  . Dizziness and weakness    Immunizations Administered    Name Date Dose VIS Date Route   Pfizer COVID-19 Vaccine 03/30/2019  2:55 PM 0.3 mL 01/10/2019 Intramuscular   Manufacturer: [place]   Lot: [id number]   [place]: [id number]

## 2019-04-29 ENCOUNTER — Ambulatory Visit: Payer: Medicare Other | Attending: Internal Medicine

## 2019-04-29 DIAGNOSIS — Z23 Encounter for immunization: Secondary | ICD-10-CM

## 2019-04-29 NOTE — Progress Notes (Signed)
   Covid-19 Vaccination Clinic  Name:  Shannon [name]    MRN: [id number] DOB: [birth year]  04/29/2019  [name] was observed post Covid-19 immunization for 15 minutes without incident. She was provided with Vaccine Information Sheet and instruction to access the V-Safe system.   [name] was instructed to call 911 with any severe reactions post vaccine: Marland Kitchen Difficulty breathing  . Swelling of face and throat  . A fast heartbeat  . A bad rash all over body  . Dizziness and weakness   Immunizations Administered    Name Date Dose VIS Date Route   Pfizer COVID-19 Vaccine 04/29/2019  1:36 PM 0.3 mL 01/10/2019 Intramuscular   Manufacturer: [place]   Lot: [id number]   [place]: [id number]

## 2019-04-30 ENCOUNTER — Telehealth: Payer: Self-pay | Admitting: *Deleted

## 2019-04-30 ENCOUNTER — Other Ambulatory Visit: Payer: Self-pay | Admitting: Family Medicine

## 2019-04-30 MED ORDER — VENLAFAXINE HCL ER 75 MG PO CP24: 150.0000 mg | ORAL_CAPSULE | Freq: Every day | ORAL | 0 refills | Status: DC

## 2019-04-30 MED ORDER — FOLIC ACID 1 MG PO TABS: 1.0000 mg | ORAL_TABLET | Freq: Every day | ORAL | 0 refills | Status: DC

## 2019-04-30 MED ORDER — ROSUVASTATIN CALCIUM 20 MG PO TABS: ORAL_TABLET | ORAL | 0 refills | Status: DC

## 2019-04-30 MED ORDER — TELMISARTAN 80 MG PO TABS: 80.0000 mg | ORAL_TABLET | Freq: Every day | ORAL | 0 refills | Status: DC

## 2019-04-30 MED ORDER — GABAPENTIN 300 MG PO CAPS: 300.0000 mg | ORAL_CAPSULE | Freq: Two times a day (BID) | ORAL | 0 refills | Status: DC | PRN

## 2019-04-30 MED ORDER — CLONAZEPAM 0.5 MG PO TABS: 0.2500 mg | ORAL_TABLET | Freq: Two times a day (BID) | ORAL | 0 refills | Status: DC | PRN

## 2019-04-30 NOTE — Telephone Encounter (Signed)
Needing refill on clonazepam.  Need to be sent to [facility] written: 06/07/18 Last ov: 06/07/18 Next ov: none Contract: 10/18/17 UDS: 10/18/17

## 2019-04-30 NOTE — Telephone Encounter (Signed)
Left message with husband for patient to call back to see if she starting to use [facility] pharmacy now.

## 2019-04-30 NOTE — Telephone Encounter (Signed)
She needs an appointment soon it has been too long since I saw her to send in a 3 month supply so I have sent a 30 day supply to her local pharmacy

## 2019-04-30 NOTE — Telephone Encounter (Signed)
Patient called back stating that she uses [facility] now

## 2019-05-01 NOTE — Telephone Encounter (Signed)
No answer no vm

## 2019-05-05 NOTE — Telephone Encounter (Signed)
Patient will call back to schedule appointment.

## 2019-05-14 ENCOUNTER — Encounter: Payer: Self-pay | Admitting: *Deleted

## 2019-05-19 DIAGNOSIS — M25552 Pain in left hip: Secondary | ICD-10-CM | POA: Diagnosis not present

## 2019-05-19 DIAGNOSIS — M533 Sacrococcygeal disorders, not elsewhere classified: Secondary | ICD-10-CM | POA: Diagnosis not present

## 2019-05-27 ENCOUNTER — Telehealth: Payer: Self-pay | Admitting: *Deleted

## 2019-05-27 NOTE — Telephone Encounter (Signed)
Error

## 2019-06-04 DIAGNOSIS — Z96653 Presence of artificial knee joint, bilateral: Secondary | ICD-10-CM | POA: Diagnosis not present

## 2019-06-04 DIAGNOSIS — M7062 Trochanteric bursitis, left hip: Secondary | ICD-10-CM | POA: Diagnosis not present

## 2019-06-04 DIAGNOSIS — M25562 Pain in left knee: Secondary | ICD-10-CM | POA: Diagnosis not present

## 2019-06-04 DIAGNOSIS — M25362 Other instability, left knee: Secondary | ICD-10-CM | POA: Diagnosis not present

## 2019-06-04 DIAGNOSIS — Z471 Aftercare following joint replacement surgery: Secondary | ICD-10-CM | POA: Diagnosis not present

## 2019-06-11 DIAGNOSIS — M25362 Other instability, left knee: Secondary | ICD-10-CM | POA: Diagnosis not present

## 2019-06-11 DIAGNOSIS — M7062 Trochanteric bursitis, left hip: Secondary | ICD-10-CM | POA: Diagnosis not present

## 2019-06-13 DIAGNOSIS — M7062 Trochanteric bursitis, left hip: Secondary | ICD-10-CM | POA: Diagnosis not present

## 2019-06-13 DIAGNOSIS — M25362 Other instability, left knee: Secondary | ICD-10-CM | POA: Diagnosis not present

## 2019-06-16 DIAGNOSIS — M7062 Trochanteric bursitis, left hip: Secondary | ICD-10-CM | POA: Diagnosis not present

## 2019-06-16 DIAGNOSIS — M25362 Other instability, left knee: Secondary | ICD-10-CM | POA: Diagnosis not present

## 2019-06-19 DIAGNOSIS — M25362 Other instability, left knee: Secondary | ICD-10-CM | POA: Diagnosis not present

## 2019-06-19 DIAGNOSIS — M7062 Trochanteric bursitis, left hip: Secondary | ICD-10-CM | POA: Diagnosis not present

## 2019-06-24 DIAGNOSIS — M7062 Trochanteric bursitis, left hip: Secondary | ICD-10-CM | POA: Diagnosis not present

## 2019-06-24 DIAGNOSIS — M25362 Other instability, left knee: Secondary | ICD-10-CM | POA: Diagnosis not present

## 2019-06-25 ENCOUNTER — Other Ambulatory Visit: Payer: Self-pay | Admitting: Family Medicine

## 2019-06-27 DIAGNOSIS — M25362 Other instability, left knee: Secondary | ICD-10-CM | POA: Diagnosis not present

## 2019-06-27 DIAGNOSIS — M7062 Trochanteric bursitis, left hip: Secondary | ICD-10-CM | POA: Diagnosis not present

## 2019-07-01 DIAGNOSIS — M7062 Trochanteric bursitis, left hip: Secondary | ICD-10-CM | POA: Diagnosis not present

## 2019-07-01 DIAGNOSIS — M25362 Other instability, left knee: Secondary | ICD-10-CM | POA: Diagnosis not present

## 2019-07-07 ENCOUNTER — Other Ambulatory Visit: Payer: Self-pay | Admitting: Family Medicine

## 2019-07-07 DIAGNOSIS — M7062 Trochanteric bursitis, left hip: Secondary | ICD-10-CM | POA: Diagnosis not present

## 2019-07-07 DIAGNOSIS — M25362 Other instability, left knee: Secondary | ICD-10-CM | POA: Diagnosis not present

## 2019-07-10 DIAGNOSIS — M7062 Trochanteric bursitis, left hip: Secondary | ICD-10-CM | POA: Diagnosis not present

## 2019-07-10 DIAGNOSIS — M25362 Other instability, left knee: Secondary | ICD-10-CM | POA: Diagnosis not present

## 2019-07-14 DIAGNOSIS — M7062 Trochanteric bursitis, left hip: Secondary | ICD-10-CM | POA: Diagnosis not present

## 2019-07-14 DIAGNOSIS — M25362 Other instability, left knee: Secondary | ICD-10-CM | POA: Diagnosis not present

## 2019-07-18 ENCOUNTER — Other Ambulatory Visit: Payer: Self-pay | Admitting: Family Medicine

## 2019-07-28 ENCOUNTER — Other Ambulatory Visit: Payer: Self-pay

## 2019-07-28 MED ORDER — ROSUVASTATIN CALCIUM 20 MG PO TABS: ORAL_TABLET | ORAL | 0 refills | Status: DC

## 2019-07-28 MED ORDER — CARVEDILOL 25 MG PO TABS: 25.0000 mg | ORAL_TABLET | Freq: Two times a day (BID) | ORAL | 1 refills | Status: DC

## 2019-07-28 MED ORDER — GABAPENTIN 300 MG PO CAPS: 300.0000 mg | ORAL_CAPSULE | Freq: Two times a day (BID) | ORAL | 0 refills | Status: DC | PRN

## 2019-07-28 MED ORDER — TELMISARTAN 80 MG PO TABS: 80.0000 mg | ORAL_TABLET | Freq: Every day | ORAL | 0 refills | Status: DC

## 2019-07-28 NOTE — Telephone Encounter (Signed)
This is Dr. Crenshaw's pt 

## 2019-08-08 ENCOUNTER — Other Ambulatory Visit: Payer: Self-pay | Admitting: Family Medicine

## 2019-08-08 ENCOUNTER — Other Ambulatory Visit: Payer: Self-pay | Admitting: Internal Medicine

## 2019-08-08 DIAGNOSIS — Z1231 Encounter for screening mammogram for malignant neoplasm of breast: Secondary | ICD-10-CM

## 2019-08-15 ENCOUNTER — Telehealth: Payer: Self-pay | Admitting: Family Medicine

## 2019-08-15 DIAGNOSIS — M7062 Trochanteric bursitis, left hip: Secondary | ICD-10-CM | POA: Diagnosis not present

## 2019-08-15 DIAGNOSIS — M25362 Other instability, left knee: Secondary | ICD-10-CM | POA: Diagnosis not present

## 2019-08-15 NOTE — Telephone Encounter (Signed)
Left message with gentleman for her to call back to schedule an appointment to get refills.  It has been over a year.  Patient was suppose to called back to schedule back when we talked in March.

## 2019-08-15 NOTE — Telephone Encounter (Signed)
Medication: clonazePAM (KLONOPIN) 0.5 MG tablet [[id number]]       Has the patient contacted their pharmacy?  (If no, request that the patient contact the pharmacy for the refill.) (If yes, when and what did the pharmacy advise?)     Preferred Pharmacy (with phone number or street name): [facility] [address]  [place], [place] [address]  Phone:  [phone] Fax:  [phone]      Agent: Please be advised that RX refills may take up to 3 business days. We ask that you follow-up with your pharmacy.

## 2019-08-18 ENCOUNTER — Other Ambulatory Visit: Payer: Self-pay | Admitting: Family Medicine

## 2019-08-18 DIAGNOSIS — M25362 Other instability, left knee: Secondary | ICD-10-CM | POA: Diagnosis not present

## 2019-08-18 DIAGNOSIS — M7062 Trochanteric bursitis, left hip: Secondary | ICD-10-CM | POA: Diagnosis not present

## 2019-08-20 DIAGNOSIS — M7062 Trochanteric bursitis, left hip: Secondary | ICD-10-CM | POA: Diagnosis not present

## 2019-08-20 DIAGNOSIS — M25362 Other instability, left knee: Secondary | ICD-10-CM | POA: Diagnosis not present

## 2019-08-21 ENCOUNTER — Telehealth: Payer: Self-pay | Admitting: *Deleted

## 2019-08-21 ENCOUNTER — Other Ambulatory Visit: Payer: Self-pay | Admitting: Family Medicine

## 2019-08-21 MED ORDER — CLONAZEPAM 0.5 MG PO TABS: 0.2500 mg | ORAL_TABLET | Freq: Two times a day (BID) | ORAL | 0 refills | Status: DC | PRN

## 2019-08-21 NOTE — Telephone Encounter (Signed)
Sent to pharmacy 

## 2019-08-21 NOTE — Telephone Encounter (Signed)
Patient has an appointment next Tuesday, but needs a refill on clonazepam.  She is completely out.  Last written: 04/30/19 Last ov: 06/07/18 Next ov: none Contract: will get next week UDS: will get next week

## 2019-08-22 NOTE — Telephone Encounter (Signed)
Patient notified rx has been sent to pharmacy

## 2019-08-25 DIAGNOSIS — M7062 Trochanteric bursitis, left hip: Secondary | ICD-10-CM | POA: Diagnosis not present

## 2019-08-25 DIAGNOSIS — M25362 Other instability, left knee: Secondary | ICD-10-CM | POA: Diagnosis not present

## 2019-08-26 ENCOUNTER — Other Ambulatory Visit: Payer: Self-pay

## 2019-08-26 ENCOUNTER — Encounter: Payer: Self-pay | Admitting: Family Medicine

## 2019-08-26 ENCOUNTER — Ambulatory Visit (INDEPENDENT_AMBULATORY_CARE_PROVIDER_SITE_OTHER): Payer: Medicare Other | Admitting: Family Medicine

## 2019-08-26 VITALS — BP 120/76 | HR 72 | Temp 98.2°F | Resp 12 | Ht 62.0 in | Wt 184.0 lb

## 2019-08-26 DIAGNOSIS — I420 Dilated cardiomyopathy: Secondary | ICD-10-CM | POA: Diagnosis not present

## 2019-08-26 DIAGNOSIS — Z79899 Other long term (current) drug therapy: Secondary | ICD-10-CM | POA: Diagnosis not present

## 2019-08-26 DIAGNOSIS — E782 Mixed hyperlipidemia: Secondary | ICD-10-CM | POA: Diagnosis not present

## 2019-08-26 DIAGNOSIS — R739 Hyperglycemia, unspecified: Secondary | ICD-10-CM | POA: Diagnosis not present

## 2019-08-26 DIAGNOSIS — R7303 Prediabetes: Secondary | ICD-10-CM | POA: Diagnosis not present

## 2019-08-26 DIAGNOSIS — I10 Essential (primary) hypertension: Secondary | ICD-10-CM

## 2019-08-26 DIAGNOSIS — E559 Vitamin D deficiency, unspecified: Secondary | ICD-10-CM

## 2019-08-26 DIAGNOSIS — F418 Other specified anxiety disorders: Secondary | ICD-10-CM

## 2019-08-26 MED ORDER — ALBUTEROL SULFATE HFA 108 (90 BASE) MCG/ACT IN AERS: 1.0000 | INHALATION_SPRAY | Freq: Four times a day (QID) | RESPIRATORY_TRACT | 3 refills | Status: DC | PRN

## 2019-08-26 NOTE — Patient Instructions (Signed)

## 2019-08-27 LAB — DRUG MONITORING, PANEL 8 WITH CONFIRMATION, URINE
6 Acetylmorphine: NEGATIVE ng/mL (ref <10)
Alcohol Metabolites: NEGATIVE ng/mL
Amphetamines: NEGATIVE ng/mL (ref <500)
Benzodiazepines: NEGATIVE ng/mL (ref <100)
Buprenorphine, Urine: NEGATIVE ng/mL (ref <5)
Cocaine Metabolite: NEGATIVE ng/mL (ref <150)
Creatinine: 50.3 mg/dL
MDMA: NEGATIVE ng/mL (ref <500)
Marijuana Metabolite: NEGATIVE ng/mL (ref <20)
Opiates: NEGATIVE ng/mL (ref <100)
Oxidant: NEGATIVE ug/mL
Oxycodone: NEGATIVE ng/mL (ref <100)
pH: 6.2 (ref 4.5–9.0)

## 2019-08-27 LAB — LIPID PANEL
Cholesterol: 165 mg/dL (ref 0–200)
HDL: 47.3 mg/dL (ref >39.00)
NonHDL: 117.23
Total CHOL/HDL Ratio: 3
Triglycerides: 209 mg/dL — ABNORMAL HIGH (ref 0.0–149.0)
VLDL: 41.8 mg/dL — ABNORMAL HIGH (ref 0.0–40.0)

## 2019-08-27 LAB — CBC
HCT: 35.7 % — ABNORMAL LOW (ref 36.0–46.0)
Hemoglobin: 11.6 g/dL — ABNORMAL LOW (ref 12.0–15.0)
MCHC: 32.5 g/dL (ref 30.0–36.0)
MCV: 90.4 fl (ref 78.0–100.0)
Platelets: 264 10*3/uL (ref 150.0–400.0)
RBC: 3.94 Mil/uL (ref 3.87–5.11)
RDW: 14 % (ref 11.5–15.5)
WBC: 6.8 10*3/uL (ref 4.0–10.5)

## 2019-08-27 LAB — TSH: TSH: 2.83 u[IU]/mL (ref 0.35–4.50)

## 2019-08-27 LAB — COMPREHENSIVE METABOLIC PANEL
ALT: 21 U/L (ref 0–35)
AST: 26 U/L (ref 0–37)
Albumin: 4.3 g/dL (ref 3.5–5.2)
Alkaline Phosphatase: 98 U/L (ref 39–117)
BUN: 14 mg/dL (ref 6–23)
CO2: 32 mEq/L (ref 19–32)
Calcium: 9.4 mg/dL (ref 8.4–10.5)
Chloride: 105 mEq/L (ref 96–112)
Creatinine, Ser: 0.91 mg/dL (ref 0.40–1.20)
GFR: 60.23 mL/min (ref >60.00)
Glucose, Bld: 105 mg/dL — ABNORMAL HIGH (ref 70–99)
Potassium: 4 mEq/L (ref 3.5–5.1)
Sodium: 143 mEq/L (ref 135–145)
Total Bilirubin: 0.3 mg/dL (ref 0.2–1.2)
Total Protein: 6.5 g/dL (ref 6.0–8.3)

## 2019-08-27 LAB — VITAMIN D 25 HYDROXY (VIT D DEFICIENCY, FRACTURES): VITD: 31.11 ng/mL (ref 30.00–100.00)

## 2019-08-27 LAB — DM TEMPLATE

## 2019-08-27 LAB — HEMOGLOBIN A1C: Hgb A1c MFr Bld: 5.5 % (ref 4.6–6.5)

## 2019-08-27 LAB — LDL CHOLESTEROL, DIRECT: Direct LDL: 95 mg/dL

## 2019-08-27 NOTE — Assessment & Plan Note (Signed)
Encouraged heart healthy diet, increase exercise, avoid trans fats, consider a krill oil cap daily 

## 2019-08-27 NOTE — Assessment & Plan Note (Signed)
Well controlled, no changes to meds. Encouraged heart healthy diet such as the DASH diet and exercise as tolerated.  °

## 2019-08-27 NOTE — Assessment & Plan Note (Signed)
Supplement and monitor 

## 2019-08-27 NOTE — Progress Notes (Signed)
Subjective:    Patient ID: Shannon [name], female    DOB: 1945-04-08, 74 y.o.   MRN: [id number]  Chief Complaint  Patient presents with   Follow-up   knot on left lower leg from a fall 2 years ago    HPI Patient is in today for follow up on chronic medical concerns. No recent febrile illness or hospitalizations. No polyuria or polydipsia. She notes anhedonia but no suicidal ideation. She is struggling with depression that has worsened since she lost her son. She feels the medication helps but she has been struggling with whether to stay with her husband due to his betrayal several years ago and she had always hoped to leave him after they lost their special needs son but now her husband is developing more health problems so she is not sure she will be able to leave. Her children are supportive of her but she is conflicted. Denies CP/palp/SOB/HA/congestion/fevers/GI or GU c/o. 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   Anemia 08/28/2011   Asthma    as a child   Brain tumor ([place])    Breast cancer ([place])    right breast   Carpal tunnel syndrome of left wrist    Cataracts, bilateral 12/28/2015   Constipation 03/03/2012   DEPRESSION 08/11/2008   Dermatitis    Facial fracture ([place])    GERD 08/11/2008   H/O measles    H/O mumps    History of blood transfusion    History of chicken pox    History of hiatal hernia    Hx breast cancer, IDC, Right, 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/2009   BRCA 2 NEGATIVE, CHEMO AND RADIATION X 1 YR   HYPERLIPIDEMIA 08/11/2008   HYPERTENSION 08/11/2008   Hyponatremia 08/28/2011   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 July 2015 should repeat every 1-2 years Last Pap December 2015, repeat in 3 years.    Melanoma ([place]) 2008   DR. Ronnald Ramp   Neuropathy    NICM (nonischemic cardiomyopathy) ([place]) 03/08/2010   likely 2/2 chemotx - a. Echo 2012: EF 45-50%;  b. Lex MV 2/12:  low risk, apical defect (small area of ischemia vs shifting breast atten);  c.  Echo 7/12: Normal wall thickness, EF 60-65%, normal wall motion, grade 1 diastolic dysfunction, mild LAE, PASP 32;   d. Lex MV 11/13:  EF 76%, no ischemia   OA (osteoarthritis) 09/07/2011   Obesity 09/28/2014   Osteoporosis    Personal history of chemotherapy 2012   Personal history of radiation therapy 2012   PREDIABETES 06/15/2009   Recurrent falls 12/28/2015   Shortness of breath    UTI (lower urinary tract infection) 03/03/2012   Vertebral fracture, osteoporotic, sequela 04/05/2016    Past Surgical History:  Procedure Laterality Date   BREAST LUMPECTOMY  04/2009   RIGHT FOR BREAST CANCER-CHEMO/RADIATION X 1 YEAR   BREAST REDUCTION SURGERY Bilateral 05/04/2014   Procedure: MAMMARY REDUCTION  (BREAST);  Surgeon: Cristine Polio, MD;  Location: [facility];  Service: Plastics;  Laterality: Bilateral;   CARPAL TUNNEL RELEASE     L  wrist, ulnar nerve moved   CHOLECYSTECTOMY     CRANIOTOMY FOR TUMOR  2000   ELBOW SURGERY     left   KNEE ARTHROSCOPY Left 10/12/2014   Procedure: LEFT KNEE ARTHROSCOPY ;  Surgeon: Gaynelle Arabian, MD;  Location: [facility];  Service: Orthopedics;  Laterality: Left;   KYPHOPLASTY N/A 03/30/2016   Procedure: THORACIC 12 KYPHOPLASTY;  Surgeon: Phylliss Bob, MD;  Location: [facility];  Service: Orthopedics;  Laterality: N/A;  THORACIC 12 KYPHOPLASTY   LEFT HEART CATHETERIZATION WITH CORONARY ANGIOGRAM N/A 12/16/2013   Procedure: LEFT HEART CATHETERIZATION WITH CORONARY ANGIOGRAM;  Surgeon: Peter M [name], MD;  Location: [facility] CATH LAB;  Service: Cardiovascular;  Laterality:  N/A;   LIPOMA EXCISION  03/28/2009   right leg   MELANOMA EXCISION     PORT-A-CATH REMOVAL  11/30/2010   [place]   porta cath     PORTACATH PLACEMENT  may 2011   REDUCTION MAMMAPLASTY Bilateral    SYNOVECTOMY Left 10/12/2014   Procedure: WITH SYNOVECTOMY;  Surgeon: Gaynelle Arabian, MD;  Location: [facility];  Service: Orthopedics;  Laterality: Left;   TONSILLECTOMY  1958   TOTAL KNEE ARTHROPLASTY  02/05/2012   Procedure: TOTAL KNEE ARTHROPLASTY;  Surgeon: Gearlean Alf, MD;  Location: [facility];  Service: Orthopedics;  Laterality: Right;   TOTAL KNEE ARTHROPLASTY Left 02/10/2013   Procedure: LEFT TOTAL KNEE ARTHROPLASTY;  Surgeon: Gearlean Alf, MD;  Location: [facility];  Service: Orthopedics;  Laterality: Left;   total knee raplacement  01-2012   Right Knee   TUBAL LIGATION  1997    Family History  Problem Relation Age of Onset   Breast cancer Mother        sarcoma   Lung cancer Mother    Hypertension Mother    Prostate cancer Father    Congestive Heart Failure Father    Heart attack Father    Prostate cancer Brother    Down syndrome Son    CVA Son    Heart disease Maternal Grandfather        MI   Stomach cancer Maternal Aunt    Uterine cancer Maternal Aunt    Colon cancer Neg Hx     Social History   Socioeconomic History   Marital status: Married    Spouse name: Deidre Ala   Number of children: 3   Years of education: Not on file   Highest education level: Not on file  Occupational History   Occupation: boutique owner-retired    Employer: [employer]  Tobacco Use   Smoking status: Never Smoker   Smokeless tobacco: Never Used  Scientific laboratory technician Use: Never used  Substance and Sexual Activity   Alcohol use: No   Drug use: No   Sexual activity: Not Currently  Other Topics Concern   Not on file  Social History Narrative   Patient is right-handed. She lives with her husband in a 4 story home. They take care of their grown son with  down syndrome who has had a stroke. She drinks 1-2 glasses of tea in the evenings. She does not formally exercise.   Social Determinants of Health   Financial Resource Strain:    Difficulty of Paying Living Expenses:   Food Insecurity:    Worried About Charity fundraiser in the Last Year:    Arboriculturist in the Last Year:   Transportation Needs:    Film/video editor (Medical):    Lack of Transportation (Non-Medical):  Physical Activity:    Days of Exercise per Week:    Minutes of Exercise per Session:   Stress:    Feeling of Stress :   Social Connections:    Frequency of Communication with Friends and Family:    Frequency of Social Gatherings with Friends and Family:    Attends Religious Services:    Active Member of Clubs or Organizations:    Attends Music therapist:    Marital Status:   Intimate Partner Violence:    Fear of Current or Ex-Partner:    Emotionally Abused:    Physically Abused:    Sexually Abused:     Outpatient Medications Prior to Visit  Medication Sig Dispense Refill   acetaminophen (TYLENOL) 500 MG tablet Take 500-1,000 mg by mouth every 6 (six) hours as needed for moderate pain or headache.      Ascorbic Acid (VITAMIN C PO) Take 1 tablet by mouth daily.     aspirin EC 81 MG tablet Take 81 mg by mouth daily.     B Complex Vitamins (B COMPLEX PO) Take 1 tablet by mouth daily.     carvedilol (COREG) 25 MG tablet Take 1 tablet (25 mg total) by mouth 2 (two) times daily with a meal. 180 tablet 1   clonazePAM (KLONOPIN) 0.5 MG tablet Take 0.5-1 tablets (0.25-0.5 mg total) by mouth 2 (two) times daily as needed for anxiety. 60 tablet 0   dicyclomine (BENTYL) 20 MG tablet Take 1 tablet (20 mg total) by mouth 4 (four) times daily as needed for spasms. 50 tablet 3   ferrous sulfate 325 (65 FE) MG tablet Take 325 mg by mouth daily with breakfast.     folic acid (FOLVITE) 1 MG tablet TAKE 1 TABLET EVERY DAY 90 tablet  0   gabapentin (NEURONTIN) 300 MG capsule Take 1 capsule (300 mg total) by mouth 2 (two) times daily as needed. 180 capsule 0   hydrOXYzine (ATARAX/VISTARIL) 25 MG tablet Take 25 mg by mouth at bedtime.     Multiple Vitamin (MULTIVITAMIN WITH MINERALS) TABS tablet Take 1 tablet by mouth daily.     pentosan polysulfate (ELMIRON) 100 MG capsule Take 100 mg by mouth 2 (two) times daily.      rosuvastatin (CRESTOR) 20 MG tablet TAKE 1 TABLET AT BEDTIME 90 tablet 0   telmisartan (MICARDIS) 80 MG tablet Take 1 tablet (80 mg total) by mouth daily. 90 tablet 0   venlafaxine XR (EFFEXOR-XR) 75 MG 24 hr capsule TAKE 2 CAPSULES (150 MG TOTAL) BY MOUTH DAILY WITH BREAKFAST. NEEDS OFFICE VISIT. 60 capsule 0   B Complex-C (SUPER B COMPLEX PO) Take 1 tablet by mouth daily.     mirabegron ER (MYRBETRIQ) 50 MG TB24 tablet Take 50 mg by mouth daily.     No facility-administered medications prior to visit.    Allergies  Allergen Reactions   Dilaudid [Hydromorphone Hcl] Itching    Review of Systems  Constitutional: Positive for malaise/fatigue. Negative for fever.  HENT: Negative for congestion.   Eyes: Negative for blurred vision.  Respiratory: Negative for shortness of breath.   Cardiovascular: Negative for chest pain, palpitations and leg swelling.  Gastrointestinal: Negative for abdominal pain, blood in stool and nausea.  Genitourinary: Negative for dysuria and frequency.  Musculoskeletal: Negative for falls.  Skin: Negative for rash.  Neurological: Negative for dizziness, loss of consciousness and headaches.  Endo/Heme/Allergies: Negative for environmental allergies.  Psychiatric/Behavioral: Positive for depression. The patient is nervous/anxious.  Objective:    Physical Exam Vitals and nursing note reviewed.  Constitutional:      General: She is not in acute distress.    Appearance: She is well-developed.  HENT:     Head: Normocephalic and atraumatic.     Nose: Nose  normal.  Eyes:     General:        Right eye: No discharge.        Left eye: No discharge.  Cardiovascular:     Rate and Rhythm: Normal rate and regular rhythm.     Heart sounds: Murmur heard.   Pulmonary:     Effort: Pulmonary effort is normal.     Breath sounds: Normal breath sounds.  Abdominal:     General: Bowel sounds are normal.     Palpations: Abdomen is soft.     Tenderness: There is no abdominal tenderness.  Musculoskeletal:     Cervical back: Normal range of motion and neck supple.  Skin:    General: Skin is warm and dry.  Neurological:     Mental Status: She is alert and oriented to person, place, and time.     BP 120/76 (BP Location: Right Arm, Cuff Size: Large)    Pulse 72    Temp 98.2 F (36.8 C) (Oral)    Resp 12    Ht '5\' 2"'  (1.575 m)    Wt 184 lb (83.5 kg)    LMP 01/30/1994    SpO2 96%    BMI 33.65 kg/m  Wt Readings from Last 3 Encounters:  08/26/19 184 lb (83.5 kg)  03/28/19 165 lb (74.8 kg)  11/06/18 180 lb (81.6 kg)    Diabetic Foot Exam - Simple   No data filed     Lab Results  Component Value Date   WBC 6.8 08/26/2019   HGB 11.6 (L) 08/26/2019   HCT 35.7 (L) 08/26/2019   PLT 264.0 08/26/2019   GLUCOSE 105 (H) 08/26/2019   CHOL 165 08/26/2019   TRIG 209.0 (H) 08/26/2019   HDL 47.30 08/26/2019   LDLDIRECT 95.0 08/26/2019   LDLCALC 80 06/05/2018   ALT 21 08/26/2019   AST 26 08/26/2019   NA 143 08/26/2019   K 4.0 08/26/2019   CL 105 08/26/2019   CREATININE 0.91 08/26/2019   BUN 14 08/26/2019   CO2 32 08/26/2019   TSH 2.83 08/26/2019   INR 1.06 03/30/2016   HGBA1C 5.5 08/26/2019    Lab Results  Component Value Date   TSH 2.83 08/26/2019   Lab Results  Component Value Date   WBC 6.8 08/26/2019   HGB 11.6 (L) 08/26/2019   HCT 35.7 (L) 08/26/2019   MCV 90.4 08/26/2019   PLT 264.0 08/26/2019   Lab Results  Component Value Date   NA 143 08/26/2019   K 4.0 08/26/2019   CHLORIDE 107 08/07/2016   CO2 32 08/26/2019   GLUCOSE 105  (H) 08/26/2019   BUN 14 08/26/2019   CREATININE 0.91 08/26/2019   BILITOT 0.3 08/26/2019   ALKPHOS 98 08/26/2019   AST 26 08/26/2019   ALT 21 08/26/2019   PROT 6.5 08/26/2019   ALBUMIN 4.3 08/26/2019   CALCIUM 9.4 08/26/2019   ANIONGAP 8 12/14/2017   EGFR 72 (L) 08/07/2016   GFR 60.23 08/26/2019   Lab Results  Component Value Date   CHOL 165 08/26/2019   Lab Results  Component Value Date   HDL 47.30 08/26/2019   Lab Results  Component Value Date   LDLCALC 80 06/05/2018  Lab Results  Component Value Date   TRIG 209.0 (H) 08/26/2019   Lab Results  Component Value Date   CHOLHDL 3 08/26/2019   Lab Results  Component Value Date   HGBA1C 5.5 08/26/2019       Assessment & Plan:   Problem List Items Addressed This Visit    Hyperlipidemia, mixed    Encouraged heart healthy diet, increase exercise, avoid trans fats, consider a krill oil cap daily      Relevant Orders   Lipid panel (Completed)   Depression with anxiety - Primary    She is struggling with depression that has worsened since she lost her son. She feels the medication helps but she has been struggling with whether to stay with her husband. He had a very painful affair several years ago and she had always hoped to leave him after they lost their special needs son but now her husband is developing more health problems so she is not sure she will be able to leave. She is referred for counseling and a refill on Alprazolam is given.       Relevant Orders   DRUG MONITORING, PANEL 8 WITH CONFIRMATION, URINE (Completed)   Ambulatory referral to [place] hypertension    Well controlled, no changes to meds. Encouraged heart healthy diet such as the DASH diet and exercise as tolerated.       Relevant Orders   CBC (Completed)   Comprehensive metabolic panel (Completed)   TSH (Completed)   Prediabetes    hgba1c acceptable, minimize simple carbs. Increase exercise as tolerated.        Congestive dilated cardiomyopathy (HCC)   Vitamin D deficiency    Supplement and monitor      Relevant Orders   VITAMIN D 25 Hydroxy (Vit-D Deficiency, Fractures) (Completed)    Other Visit Diagnoses    High risk medication use       Relevant Orders   DRUG MONITORING, PANEL 8 WITH CONFIRMATION, URINE (Completed)   Hyperglycemia       Relevant Orders   Hemoglobin A1c (Completed)      I have discontinued Alycia Patten. Prospero's B Complex-C (SUPER B COMPLEX PO) and mirabegron ER. I am also having her start on albuterol. Additionally, I am having her maintain her hydrOXYzine, pentosan polysulfate, acetaminophen, ferrous sulfate, multivitamin with minerals, aspirin EC, dicyclomine, folic acid, carvedilol, rosuvastatin, telmisartan, gabapentin, venlafaxine XR, clonazePAM, B Complex Vitamins (B COMPLEX PO), and Ascorbic Acid (VITAMIN C PO).  Meds ordered this encounter  Medications   albuterol (VENTOLIN HFA) 108 (90 Base) MCG/ACT inhaler    Sig: Inhale 1-2 puffs into the lungs every 6 (six) hours as needed for wheezing or shortness of breath.    Dispense:  18 g    Refill:  3     Penni Homans, MD

## 2019-08-27 NOTE — Assessment & Plan Note (Signed)
[id number] acceptable, minimize simple carbs. Increase exercise as tolerated.  

## 2019-08-27 NOTE — Assessment & Plan Note (Signed)
She is struggling with depression that has worsened since she lost her son. She feels the medication helps but she has been struggling with whether to stay with her husband. He had a very painful affair several years ago and she had always hoped to leave him after they lost their special needs son but now her husband is developing more health problems so she is not sure she will be able to leave. She is referred for counseling and a refill on Alprazolam is given.

## 2019-08-28 DIAGNOSIS — Z471 Aftercare following joint replacement surgery: Secondary | ICD-10-CM | POA: Diagnosis not present

## 2019-08-28 DIAGNOSIS — Z96653 Presence of artificial knee joint, bilateral: Secondary | ICD-10-CM | POA: Diagnosis not present

## 2019-08-28 DIAGNOSIS — M7062 Trochanteric bursitis, left hip: Secondary | ICD-10-CM | POA: Diagnosis not present

## 2019-09-01 ENCOUNTER — Other Ambulatory Visit: Payer: Self-pay | Admitting: *Deleted

## 2019-09-01 DIAGNOSIS — D619 Aplastic anemia, unspecified: Secondary | ICD-10-CM

## 2019-09-01 DIAGNOSIS — D649 Anemia, unspecified: Secondary | ICD-10-CM

## 2019-09-10 ENCOUNTER — Other Ambulatory Visit: Payer: Self-pay | Admitting: Family Medicine

## 2019-09-11 ENCOUNTER — Telehealth: Payer: Self-pay | Admitting: Adult Health

## 2019-09-11 NOTE — Telephone Encounter (Signed)
Rescheduled appointment per 8/11 sch msg. Patient is aware of updated appointment time

## 2019-09-12 ENCOUNTER — Telehealth: Payer: Self-pay | Admitting: Family Medicine

## 2019-09-12 ENCOUNTER — Ambulatory Visit
Admission: RE | Admit: 2019-09-12 | Discharge: 2019-09-12 | Disposition: A | Discharge status: Home / Self Care | Payer: Medicare Other | Source: Ambulatory Visit | Attending: Family Medicine | Admitting: Family Medicine

## 2019-09-12 ENCOUNTER — Other Ambulatory Visit: Payer: Self-pay

## 2019-09-12 DIAGNOSIS — Z1231 Encounter for screening mammogram for malignant neoplasm of breast: Secondary | ICD-10-CM

## 2019-09-12 NOTE — Telephone Encounter (Signed)
Caller: Teofila Call back# [phone]  Patient states she received fecal testing in the mail, however, testing kit is not in condition to be used. Patient states a new kit has to be re-sent.

## 2019-09-15 NOTE — Telephone Encounter (Signed)
Called the patient to let her know will send another fecal test in the mail today

## 2019-09-18 ENCOUNTER — Telehealth: Payer: Self-pay | Admitting: *Deleted

## 2019-09-18 NOTE — Telephone Encounter (Signed)
A message was left re: her follow up visit.

## 2019-09-24 NOTE — Telephone Encounter (Signed)
Patient advised to return to office

## 2019-09-24 NOTE — Telephone Encounter (Signed)
New Message:    Pt is calling and states she does not have a return envelope to return her fecal sample. Please advise.

## 2019-09-26 ENCOUNTER — Other Ambulatory Visit: Payer: Self-pay

## 2019-09-26 ENCOUNTER — Encounter: Payer: Medicare Other | Admitting: Adult Health

## 2019-09-26 ENCOUNTER — Inpatient Hospital Stay: Payer: Medicare Other | Attending: Adult Health | Admitting: Adult Health

## 2019-09-26 VITALS — BP 146/75 | HR 73 | Temp 97.7°F | Resp 17 | Ht 62.0 in | Wt 185.2 lb

## 2019-09-26 DIAGNOSIS — Z171 Estrogen receptor negative status [ER-]: Secondary | ICD-10-CM | POA: Diagnosis not present

## 2019-09-26 DIAGNOSIS — C50311 Malignant neoplasm of lower-inner quadrant of right female breast: Secondary | ICD-10-CM | POA: Diagnosis not present

## 2019-09-26 DIAGNOSIS — Z853 Personal history of malignant neoplasm of breast: Secondary | ICD-10-CM | POA: Diagnosis not present

## 2019-09-26 NOTE — Progress Notes (Signed)
CLINIC:  Survivorship   REASON FOR VISIT:  Routine follow-up for history of breast cancer.   BRIEF ONCOLOGIC HISTORY:  Oncology History  Breast cancer of lower-inner quadrant of right female breast ([place])  04/2009 Initial Biopsy   Right breast core needle bx: IDC, ER/PR-, HER2/neu positive, Ki67 58%   04/2009 Clinical Stage   Stage IA: T1c No    - 08/2009 Neo-Adjuvant Chemotherapy   Taxol/ carbo/trastuzumab x 2, then Taxol/trastuzumab weekly x 3 (with last cycle with weekly carbo), A/C x 2 cycles with doxorubicin by continuous infusion (chemo poorly tolerated)   09/2009 Definitive Surgery   Right lumpectomy/SLNB: complete pathologic response    - 11/2009 Radiation Therapy   Adjuvant RT: right breast   09/2009 - 01/2010 Chemotherapy   Maintenance trastuzumab; discontinued due to drop in EF, which has since respolved      INTERVAL HISTORY:  Shannon [name] presents to the [facility] today for routine follow-up for her history of breast cancer.    Shannon [name] is feeling moderately well today.  She notes that she lost her special needs son in December, 2020.  This has been difficult for her.  She is dealing with her grief, and she and her husband attend a grief class.  She is also reading books about grief.  She misses her son dearly, and notes she is working through the pain.    Shannon [name] is not exercising as regularly as she could be, she also notes she is due to schedule her pcp f/u and skin exam.  She underwent mammogram on 09/25/2019 that was negative, and breast density was category A.    She denies any new health changes or health concerns this year.    REVIEW OF SYSTEMS:  Review of Systems  Constitutional: Negative for appetite change, chills, fatigue, fever and unexpected weight change.  HENT:   Negative for hearing loss, lump/mass and trouble swallowing.   Eyes: Negative for eye problems and icterus.  Respiratory: Negative for chest tightness, cough and shortness of breath.     Cardiovascular: Negative for chest pain, leg swelling and palpitations.  Gastrointestinal: Negative for abdominal distention, abdominal pain, constipation, diarrhea, nausea and vomiting.  Endocrine: Negative for hot flashes.  Skin: Negative for itching and rash.  Neurological: Negative for dizziness, extremity weakness and headaches. Numbness:   Hematological: Negative for adenopathy. Does not bruise/bleed easily.  Psychiatric/Behavioral: Negative for depression. The patient is not nervous/anxious.   Breast: Denies any new nodularity, masses, tenderness, nipple changes, or nipple discharge.       PAST MEDICAL/SURGICAL HISTORY:  Past Medical History:  Diagnosis Date  . 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 Anemia 08/28/2011  . Asthma    as a child  . Brain tumor ([place])   . Breast cancer ([place])    right breast  . Carpal tunnel syndrome of left wrist   . Cataracts, bilateral 12/28/2015  . Constipation 03/03/2012  . DEPRESSION 08/11/2008  . Dermatitis   . Facial fracture ([place])   . GERD 08/11/2008  . H/O measles   . H/O mumps   . History of blood transfusion   . History of chicken pox   . History of hiatal hernia   . 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 HYPERLIPIDEMIA 08/11/2008  . HYPERTENSION 08/11/2008  . Hyponatremia 08/28/2011  . 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 Interstitial cystitis   .  LIPOMA 02/10/2009  . Medicare annual wellness visit, subsequent 12/27/2014   Follows with Dr Amalia Hailey of urology Follows with Dr Posey Pronto at [facility] for opthamology Follows with Dr [name] of dermatology Follows with [facility] gastroenterology, last scope done in 2011 repeat in 10 years Last [facility] July 2015 should repeat every 1-2 years Last Pap December 2015, repeat in 3 years.   . Melanoma ([place]) 2008   DR. Ronnald Ramp  . Neuropathy   . NICM (nonischemic cardiomyopathy) ([place]) 03/08/2010   likely 2/2 chemotx - a. Echo 2012:  EF 45-50%;  b. Lex MV 2/12:  low risk, apical defect (small area of ischemia vs shifting breast atten);  c.  Echo 7/12: Normal wall thickness, EF 60-65%, normal wall motion, grade 1 diastolic dysfunction, mild LAE, PASP 32;   d. Lex MV 11/13:  EF 76%, no ischemia  . OA (osteoarthritis) 09/07/2011  . Obesity 09/28/2014  . Osteoporosis   . Personal history of chemotherapy 2012  . Personal history of radiation therapy 2012  . PREDIABETES 06/15/2009  . Recurrent falls 12/28/2015  . Shortness of breath   . UTI (lower urinary tract infection) 03/03/2012  . Vertebral fracture, osteoporotic, sequela 04/05/2016   Past Surgical History:  Procedure Laterality Date  . BREAST LUMPECTOMY  04/2009   RIGHT FOR BREAST CANCER-CHEMO/RADIATION X 1 YEAR  . BREAST REDUCTION SURGERY Bilateral 05/04/2014   Procedure: MAMMARY REDUCTION  (BREAST);  Surgeon: Cristine Polio, MD;  Location: [facility];  Service: Plastics;  Laterality: Bilateral;  . CARPAL TUNNEL RELEASE     L wrist, ulnar nerve moved  . CHOLECYSTECTOMY    . CRANIOTOMY FOR TUMOR  2000  . ELBOW SURGERY     left  . KNEE ARTHROSCOPY Left 10/12/2014   Procedure: LEFT KNEE ARTHROSCOPY ;  Surgeon: Gaynelle Arabian, MD;  Location: [facility];  Service: Orthopedics;  Laterality: Left;  . KYPHOPLASTY N/A 03/30/2016   Procedure: THORACIC 12 KYPHOPLASTY;  Surgeon: Phylliss Bob, MD;  Location: [facility];  Service: Orthopedics;  Laterality: N/A;  THORACIC 12 KYPHOPLASTY  . LEFT HEART CATHETERIZATION WITH CORONARY ANGIOGRAM N/A 12/16/2013   Procedure: LEFT HEART CATHETERIZATION WITH CORONARY ANGIOGRAM;  Surgeon: Peter M [name], MD;  Location: [facility] C[facility];  Service: Cardiovascular;  Laterality: N/A;  . LIPOMA EXCISION  03/28/2009   right leg  . MELANOMA EXCISION    . PORT-A-CATH REMOVAL  11/30/2010   [place]  . porta cath    . PORTACATH PLACEMENT  may 2011  . REDUCTION MAMMAPLASTY Bilateral   . SYNOVECTOMY Left 10/12/2014   Procedure: WITH SYNOVECTOMY;  Surgeon:  Gaynelle Arabian, MD;  Location: [facility];  Service: Orthopedics;  Laterality: Left;  . TONSILLECTOMY  1958  . TOTAL KNEE ARTHROPLASTY  02/05/2012   Procedure: TOTAL KNEE ARTHROPLASTY;  Surgeon: Gearlean Alf, MD;  Location: [facility];  Service: Orthopedics;  Laterality: Right;  . TOTAL KNEE ARTHROPLASTY Left 02/10/2013   Procedure: LEFT TOTAL KNEE ARTHROPLASTY;  Surgeon: Gearlean Alf, MD;  Location: [facility];  Service: Orthopedics;  Laterality: Left;  . total knee raplacement  01-2012   Right Knee  . TUBAL LIGATION  1997     ALLERGIES:  Allergies  Allergen Reactions  . Dilaudid [Hydromorphone Hcl] Itching     CURRENT MEDICATIONS:  Outpatient Encounter Medications as of 09/26/2019  Medication Sig  . acetaminophen (TYLENOL) 500 MG tablet Take 500-1,000 mg by mouth every 6 (six) hours as needed for moderate pain or headache.   . albuterol (VENTOLIN HFA)  108 (90 Base) MCG/ACT inhaler Inhale 1-2 puffs into the lungs every 6 (six) hours as needed for wheezing or shortness of breath.  . Ascorbic Acid (VITAMIN C PO) Take 1 tablet by mouth daily.  Marland Kitchen aspirin EC 81 MG tablet Take 81 mg by mouth daily.  . B Complex Vitamins (B COMPLEX PO) Take 1 tablet by mouth daily.  . carvedilol (COREG) 25 MG tablet Take 1 tablet (25 mg total) by mouth 2 (two) times daily with a meal.  . clonazePAM (KLONOPIN) 0.5 MG tablet Take 0.5-1 tablets (0.25-0.5 mg total) by mouth 2 (two) times daily as needed for anxiety.  . dicyclomine (BENTYL) 20 MG tablet Take 1 tablet (20 mg total) by mouth 4 (four) times daily as needed for spasms.  . ferrous sulfate 325 (65 FE) MG tablet Take 325 mg by mouth daily with breakfast.  . folic acid (FOLVITE) 1 MG tablet TAKE 1 TABLET EVERY DAY  . gabapentin (NEURONTIN) 300 MG capsule Take 1 capsule (300 mg total) by mouth 2 (two) times daily as needed.  . hydrOXYzine (ATARAX/VISTARIL) 25 MG tablet Take 25 mg by mouth at bedtime.  . Multiple Vitamin (MULTIVITAMIN WITH MINERALS) TABS tablet  Take 1 tablet by mouth daily.  . pentosan polysulfate (ELMIRON) 100 MG capsule Take 100 mg by mouth 2 (two) times daily.   . rosuvastatin (CRESTOR) 20 MG tablet TAKE 1 TABLET AT BEDTIME  . telmisartan (MICARDIS) 80 MG tablet Take 1 tablet (80 mg total) by mouth daily.  Marland Kitchen venlafaxine XR (EFFEXOR-XR) 75 MG 24 hr capsule TAKE 2 CAPSULES EVERY DAY WITH BREAKFAST (NEED MD APPOINTMENT)   No facility-administered encounter medications on file as of 09/26/2019.     ONCOLOGIC FAMILY HISTORY:  Family History  Problem Relation Age of Onset  . Breast cancer Mother        sarcoma  . Lung cancer Mother   . Hypertension Mother   . Prostate cancer Father   . Congestive Heart Failure Father   . Heart attack Father   . Prostate cancer Brother   . Down syndrome Son   . CVA Son   . Heart disease Maternal Grandfather        MI  . Stomach cancer Maternal Aunt   . Uterine cancer Maternal Aunt   . Colon cancer Neg Hx      SOCIAL HISTORY:  Social History   Socioeconomic History  . Marital status: Married    Spouse name: Deidre Ala  . Number of children: 3  . Years of education: Not on file  . Highest education level: Not on file  Occupational History  . Occupation: boutique owner-retired    Employer: [employer]  Tobacco Use  . Smoking status: Never Smoker  . Smokeless tobacco: Never Used  Vaping Use  . Vaping Use: Never used  Substance and Sexual Activity  . Alcohol use: No  . Drug use: No  . Sexual activity: Not Currently  Other Topics Concern  . Not on file  Social History Narrative   Patient is right-handed. She lives with her husband in a 4 story home. They take care of their grown son with down syndrome who has had a stroke. She drinks 1-2 glasses of tea in the evenings. She does not formally exercise.   Social Determinants of Health   Financial Resource Strain:   . Difficulty of Paying Living Expenses: Not on file  Food Insecurity:   . Worried About Charity fundraiser in  the Last  Year: Not on file  . Ran Out of Food in the Last Year: Not on file  Transportation Needs:   . Lack of Transportation (Medical): Not on file  . Lack of Transportation (Non-Medical): Not on file  Physical Activity:   . Days of Exercise per Week: Not on file  . Minutes of Exercise per Session: Not on file  Stress:   . Feeling of Stress : Not on file  Social Connections:   . Frequency of Communication with Friends and Family: Not on file  . Frequency of Social Gatherings with Friends and Family: Not on file  . Attends Religious Services: Not on file  . Active Member of Clubs or Organizations: Not on file  . Attends Archivist Meetings: Not on file  . Marital Status: Not on file  Intimate Partner Violence:   . Fear of Current or Ex-Partner: Not on file  . Emotionally Abused: Not on file  . Physically Abused: Not on file  . Sexually Abused: Not on file      PHYSICAL EXAMINATION:  Vital Signs: Vitals:   09/26/19 1120  BP: (!) 146/75  Pulse: 73  Resp: 17  Temp: 97.7 F (36.5 C)  SpO2: 98%   Filed Weights   09/26/19 1120  Weight: 185 lb 3.2 oz (84 kg)   General: Well-nourished, well-appearing female in no acute distress.  Unaccompanied today.   HEENT: Head is normocephalic.  Pupils equal and reactive to light. Conjunctivae clear without exudate.  Sclerae anicteric. Oral mucosa is pink, moist.  Oropharynx is pink without lesions or erythema.  Lymph: No cervical, supraclavicular, or infraclavicular lymphadenopathy noted on palpation.  Cardiovascular: Regular rate and rhythm.Marland Kitchen Respiratory: Clear to auscultation bilaterally. Chest expansion symmetric; breathing non-labored.  Breast Exam:  -Left breast: No appreciable masses on palpation. No skin redness, thickening, or peau d'orange appearance; no nipple retraction or nipple discharge;  -Right breast: No appreciable masses on palpation. No skin redness, thickening, or peau d'orange appearance; no nipple  retraction or nipple discharge; mild distortion in symmetry at previous lumpectomy site well healed scar without erythema or nodularity. -Axilla: No axillary adenopathy bilaterally.  GI: Abdomen soft and round; non-tender, non-distended. Bowel sounds normoactive. No hepatosplenomegaly.   GU: Deferred.  Neuro: No focal deficits. Steady gait.  Psych: Mood and affect normal and appropriate for situation.  MSK: No focal spinal tenderness to palpation, full range of motion in bilateral upper extremities Extremities: No edema. Skin: Warm and dry.  LABORATORY DATA:  None for this visit   DIAGNOSTIC IMAGING:  Most recent mammogram: CLINICAL DATA:  Screening.  EXAM: DIGITAL SCREENING BILATERAL MAMMOGRAM WITH TOMO AND CAD  COMPARISON:  Previous exam(s).  ACR Breast Density Category a: The breast tissue is almost entirely fatty.  FINDINGS: There are no findings suspicious for malignancy. Images were processed with CAD.  IMPRESSION: No mammographic evidence of malignancy. A result letter of this screening mammogram will be mailed directly to the patient.  RECOMMENDATION: Screening mammogram in one year. (Code:SM-B-01Y)  BI-RADS CATEGORY  1: Negative.   Electronically Signed   By: Ammie Ferrier M.D.   On: 09/25/2019 13:09    ASSESSMENT AND PLAN:  Ms.. Legore is a pleasant 74 y.o. female with history of Stage IA right breast invasive ductal carcinoma, ER-/PR-/HER2+, diagnosed in 04/2009, treated with neo-adjuvant chemotherapy, maintenance trastuzumab, lumpectomy, and adjuvant radiation therapy.  She [facility] for surveillance and routine follow-up.   1. History of breast cancer:  Ms. Shannon [name] is  currently clinically and radiographically without evidence of disease or recurrence of breast cancer. She will be due for mammogram in 08/2020.   I gave Kerrie the choice to graduate from f/u with [place], and she notes she would like to return to see [place] every  year.  We are happy to accommodate this.  We will see her back in one year for LTS follow up I encouraged her to call me with any questions or concerns before her next visit at the cancer center, and I would be happy to see her sooner, if needed.    2. Bone health:  Given Ms. Shannon [name]'s age, history of breast cancer, she is at risk for bone demineralization.  She was given education on specific food and activities to promote bone health.  3. Cancer screening:  Due to Ms. Shannon [name]'s history and her age, she should receive screening for skin cancers, colon cancer, and gynecologic cancers. She was encouraged to follow-up with her PCP for appropriate cancer screenings.   4. Health maintenance and wellness promotion: Ms. Shannon [name] was encouraged to consume 5-7 servings of fruits and vegetables per day. She was also encouraged to engage in moderate to vigorous exercise for 30 minutes per day most days of the week. She was instructed to limit her alcohol consumption and continue to abstain from tobacco use.    Dispo:  -Return to cancer center in one year for LTS follow up -Mammogram in 08/2020   Total encounter time: 20 minutes*  Wilber Bihari, NP 09/28/19 5:00 PM Medical Oncology and Hematology [facility] [place], [place] [address] Tel. [phone]    Fax. [phone]  *Total Encounter Time as defined by the Centers for Medicare and Medicaid Services includes, in addition to the face-to-face time of a patient visit (documented in the note above) non-face-to-face time: obtaining and reviewing outside history, ordering and reviewing medications, tests or procedures, care coordination (communications with other health care professionals or caregivers) and documentation in the medical record.    Note: PRIMARY CARE PROVIDER Mosie Lukes, [place] [phone]

## 2019-09-30 DIAGNOSIS — H2513 Age-related nuclear cataract, bilateral: Secondary | ICD-10-CM | POA: Diagnosis not present

## 2019-09-30 DIAGNOSIS — L908 Other atrophic disorders of skin: Secondary | ICD-10-CM | POA: Diagnosis not present

## 2019-10-06 ENCOUNTER — Other Ambulatory Visit: Payer: Self-pay | Admitting: Family Medicine

## 2019-10-13 ENCOUNTER — Encounter: Payer: Self-pay | Admitting: General Practice

## 2019-10-13 NOTE — Progress Notes (Signed)
[name]  Continuing to attempt to reach Shannon [name] by phone for emotional and grief support per referral from Mickel Baas Gordon/LPN. Left voicemail of care and support, encouraging callback.   [place], [place], [facility] Pager [phone] Voicemail [phone]

## 2019-10-16 ENCOUNTER — Encounter: Payer: Self-pay | Admitting: General Practice

## 2019-10-16 NOTE — Progress Notes (Signed)
[facility] Spiritual Care Note  Received and returned call from [place] at an inconvenient time, so we plan to keep trying for a better time to talk in detail.   [place], [place], [facility] Pager [phone] Voicemail [phone]

## 2019-10-17 ENCOUNTER — Encounter: Payer: Self-pay | Admitting: General Practice

## 2019-10-17 NOTE — Progress Notes (Signed)
[facility] Spiritual Care Note  Left follow-up voicemail, encouraging return call.   Chaplain Marcelyn Ruppe, MDiv, [facility] Pager [phone] Voicemail [phone]  

## 2019-10-20 ENCOUNTER — Other Ambulatory Visit: Payer: Self-pay | Admitting: Family Medicine

## 2019-10-24 ENCOUNTER — Other Ambulatory Visit: Payer: Self-pay | Admitting: *Deleted

## 2019-10-24 NOTE — Telephone Encounter (Signed)
Last written: 08/21/19 Last ov: 08/26/19 Next ov: none Contract: 08/26/19 UDS: 08/26/19

## 2019-10-25 MED ORDER — CLONAZEPAM 0.5 MG PO TABS: 0.2500 mg | ORAL_TABLET | Freq: Two times a day (BID) | ORAL | 0 refills | Status: DC | PRN

## 2019-10-30 NOTE — Addendum Note (Signed)
Addended by: Zalan Shidler A on: 10/30/2019 09:54 AM   Modules accepted: Orders  

## 2019-11-03 ENCOUNTER — Other Ambulatory Visit (INDEPENDENT_AMBULATORY_CARE_PROVIDER_SITE_OTHER): Payer: Medicare Other

## 2019-11-03 ENCOUNTER — Other Ambulatory Visit: Payer: Self-pay

## 2019-11-03 DIAGNOSIS — D649 Anemia, unspecified: Secondary | ICD-10-CM

## 2019-11-03 LAB — CBC
HCT: 36.4 % (ref 35.0–45.0)
Hemoglobin: 12 g/dL (ref 11.7–15.5)
MCH: 29.3 pg (ref 27.0–33.0)
MCHC: 33 g/dL (ref 32.0–36.0)
MCV: 89 fL (ref 80.0–100.0)
MPV: 10.3 fL (ref 7.5–12.5)
Platelets: 236 10*3/uL (ref 140–400)
RBC: 4.09 10*6/uL (ref 3.80–5.10)
RDW: 12.5 % (ref 11.0–15.0)
WBC: 6.9 10*3/uL (ref 3.8–10.8)

## 2019-11-11 DIAGNOSIS — H25043 Posterior subcapsular polar age-related cataract, bilateral: Secondary | ICD-10-CM | POA: Diagnosis not present

## 2019-11-11 DIAGNOSIS — I10 Essential (primary) hypertension: Secondary | ICD-10-CM | POA: Diagnosis not present

## 2019-11-11 DIAGNOSIS — H25013 Cortical age-related cataract, bilateral: Secondary | ICD-10-CM | POA: Diagnosis not present

## 2019-11-11 DIAGNOSIS — H2511 Age-related nuclear cataract, right eye: Secondary | ICD-10-CM | POA: Diagnosis not present

## 2019-11-11 DIAGNOSIS — H2513 Age-related nuclear cataract, bilateral: Secondary | ICD-10-CM | POA: Diagnosis not present

## 2019-11-18 DIAGNOSIS — Z09 Encounter for follow-up examination after completed treatment for conditions other than malignant neoplasm: Secondary | ICD-10-CM | POA: Diagnosis not present

## 2019-11-18 DIAGNOSIS — R82998 Other abnormal findings in urine: Secondary | ICD-10-CM | POA: Diagnosis not present

## 2019-11-18 DIAGNOSIS — R809 Proteinuria, unspecified: Secondary | ICD-10-CM | POA: Diagnosis not present

## 2019-11-18 DIAGNOSIS — R350 Frequency of micturition: Secondary | ICD-10-CM | POA: Diagnosis not present

## 2019-11-18 DIAGNOSIS — R102 Pelvic and perineal pain: Secondary | ICD-10-CM | POA: Diagnosis not present

## 2019-11-18 DIAGNOSIS — R3915 Urgency of urination: Secondary | ICD-10-CM | POA: Diagnosis not present

## 2019-11-18 DIAGNOSIS — N3010 Interstitial cystitis (chronic) without hematuria: Secondary | ICD-10-CM | POA: Diagnosis not present

## 2019-11-20 DIAGNOSIS — Z23 Encounter for immunization: Secondary | ICD-10-CM | POA: Diagnosis not present

## 2019-11-26 ENCOUNTER — Other Ambulatory Visit: Payer: Self-pay | Admitting: Cardiology

## 2019-12-03 ENCOUNTER — Other Ambulatory Visit: Payer: Self-pay | Admitting: Family Medicine

## 2019-12-04 NOTE — Progress Notes (Signed)
HPI: FU nonischemic cardiomyopathy. Patient had an echocardiogram in 2012 ejection fraction of 45-50%. Reduced LV function felt secondary to chemotherapy (herceptin). Nuclear study October 2015 showed an ejection fraction of 64%. There is a small area of moderate apical ischemia report. Cardiac catheterization November 2015 showed no obstructive coronary disease. Ejection fraction 50-55%.Last echocardiogram November 2017 showed normal LV systolic function, grade 2 diastolic dysfunction and mild left atrial enlargement. Carotid Dopplers January 2018 showed 1-39% bilateral stenosis. Since last seen,she has dyspnea with more vigorous activities.  No orthopnea, PND or pedal edema.  She does not have exertional chest pain but describes occasional reflux.  Current Outpatient Medications  Medication Sig Dispense Refill  . acetaminophen (TYLENOL) 500 MG tablet Take 500-1,000 mg by mouth every 6 (six) hours as needed for moderate pain or headache.     . albuterol (VENTOLIN HFA) 108 (90 Base) MCG/ACT inhaler Inhale 1-2 puffs into the lungs every 6 (six) hours as needed for wheezing or shortness of breath. 18 g 3  . Ascorbic Acid (VITAMIN C PO) Take 1 tablet by mouth daily.    Marland Kitchen aspirin EC 81 MG tablet Take 81 mg by mouth daily.    . B Complex Vitamins (B COMPLEX PO) Take 1 tablet by mouth daily.    . carvedilol (COREG) 25 MG tablet Take 1 tablet (25 mg total) by mouth 2 (two) times daily with a meal. 180 tablet 1  . clonazePAM (KLONOPIN) 0.5 MG tablet Take 0.5-1 tablets (0.25-0.5 mg total) by mouth 2 (two) times daily as needed for anxiety. 60 tablet 0  . ferrous sulfate 325 (65 FE) MG tablet Take 325 mg by mouth daily with breakfast.    . folic acid (FOLVITE) 1 MG tablet TAKE 1 TABLET EVERY DAY 90 tablet 0  . gabapentin (NEURONTIN) 300 MG capsule Take 1 capsule (300 mg total) by mouth 2 (two) times daily as needed. 180 capsule 0  . hydrOXYzine (ATARAX/VISTARIL) 25 MG tablet Take 25 mg by mouth at  bedtime.    . Multiple Vitamin (MULTIVITAMIN WITH MINERALS) TABS tablet Take 1 tablet by mouth daily.    . pentosan polysulfate (ELMIRON) 100 MG capsule Take 100 mg by mouth 2 (two) times daily.     . rosuvastatin (CRESTOR) 20 MG tablet TAKE 1 TABLET AT BEDTIME 90 tablet 3  . telmisartan (MICARDIS) 80 MG tablet TAKE 1 TABLET EVERY DAY 90 tablet 0  . venlafaxine XR (EFFEXOR-XR) 75 MG 24 hr capsule TAKE 2 CAPSULES EVERY DAY WITH BREAKFAST 180 capsule 0   No current facility-administered medications for this visit.     Past Medical History:  Diagnosis Date  . 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 Anemia 08/28/2011  . Asthma    as a child  . Brain tumor ([name])   . Breast cancer ([place])    right breast  . Carpal tunnel syndrome of left wrist   . Cataracts, bilateral 12/28/2015  . Constipation 03/03/2012  . DEPRESSION 08/11/2008  . Dermatitis   . Facial fracture ([place])   . GERD 08/11/2008  . H/O measles   . H/O mumps   . History of blood transfusion   . History of chicken pox   . History of hiatal hernia   . 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 HYPERLIPIDEMIA 08/11/2008  . HYPERTENSION 08/11/2008  . Hyponatremia 08/28/2011  . 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 Interstitial cystitis   . LIPOMA 02/10/2009  . Medicare annual wellness visit, subsequent 12/27/2014   Follows with Dr Amalia Hailey of urology Follows with Dr Posey Pronto at [facility] for opthamology Follows with Dr [name] of dermatology Follows with [facility] gastroenterology, last scope done in 2011 repeat in 10 years Last [facility] July 2015 should repeat every 1-2 years Last Pap December 2015, repeat in 3 years.   . Melanoma ([place]) 2008   DR. Ronnald Ramp  . Neuropathy   . NICM (nonischemic cardiomyopathy) ([place]) 03/08/2010   likely 2/2 chemotx - a. Echo 2012: EF 45-50%;  b. Lex MV 2/12:  low risk, apical defect (small area of ischemia vs shifting breast atten);  c.  Echo 7/12:  Normal wall thickness, EF 60-65%, normal wall motion, grade 1 diastolic dysfunction, mild LAE, PASP 32;   d. Lex MV 11/13:  EF 76%, no ischemia  . OA (osteoarthritis) 09/07/2011  . Obesity 09/28/2014  . Osteoporosis   . Personal history of chemotherapy 2012  . Personal history of radiation therapy 2012  . PREDIABETES 06/15/2009  . Recurrent falls 12/28/2015  . Shortness of breath   . UTI (lower urinary tract infection) 03/03/2012  . Vertebral fracture, osteoporotic, sequela 04/05/2016    Past Surgical History:  Procedure Laterality Date  . BREAST LUMPECTOMY  04/2009   RIGHT FOR BREAST CANCER-CHEMO/RADIATION X 1 YEAR  . BREAST REDUCTION SURGERY Bilateral 05/04/2014   Procedure: MAMMARY REDUCTION  (BREAST);  Surgeon: Cristine Polio, MD;  Location: [place];  Service: Plastics;  Laterality: Bilateral;  . CARPAL TUNNEL RELEASE     L wrist, ulnar nerve moved  . CHOLECYSTECTOMY    . CRANIOTOMY FOR TUMOR  2000  . ELBOW SURGERY     left  . KNEE ARTHROSCOPY Left 10/12/2014   Procedure: LEFT KNEE ARTHROSCOPY ;  Surgeon: Gaynelle Arabian, MD;  Location: [facility];  Service: Orthopedics;  Laterality: Left;  . KYPHOPLASTY N/A 03/30/2016   Procedure: THORACIC 12 KYPHOPLASTY;  Surgeon: Phylliss Bob, MD;  Location: [facility];  Service: Orthopedics;  Laterality: N/A;  THORACIC 12 KYPHOPLASTY  . LEFT HEART CATHETERIZATION WITH CORONARY ANGIOGRAM N/A 12/16/2013   Procedure: LEFT HEART CATHETERIZATION WITH CORONARY ANGIOGRAM;  Surgeon: Peter M [name], MD;  Location: [facility] CATH LAB;  Service: Cardiovascular;  Laterality: N/A;  . LIPOMA EXCISION  03/28/2009   right leg  . MELANOMA EXCISION    . PORT-A-CATH REMOVAL  11/30/2010   [place]  . porta cath    . PORTACATH PLACEMENT  may 2011  . REDUCTION MAMMAPLASTY Bilateral   . SYNOVECTOMY Left 10/12/2014   Procedure: WITH SYNOVECTOMY;  Surgeon: Gaynelle Arabian, MD;  Location: [facility];  Service: Orthopedics;  Laterality: Left;  . TONSILLECTOMY  1958  . TOTAL KNEE  ARTHROPLASTY  02/05/2012   Procedure: TOTAL KNEE ARTHROPLASTY;  Surgeon: Gearlean Alf, MD;  Location: [facility];  Service: Orthopedics;  Laterality: Right;  . TOTAL KNEE ARTHROPLASTY Left 02/10/2013   Procedure: LEFT TOTAL KNEE ARTHROPLASTY;  Surgeon: Gearlean Alf, MD;  Location: [facility];  Service: Orthopedics;  Laterality: Left;  . total knee raplacement  01-2012   Right Knee  . TUBAL LIGATION  1997    Social History   Socioeconomic History  . Marital status: Married    Spouse name: Deidre Ala  . Number of children: 3  . Years of education: Not on file  . Highest education level: Not on file  Occupational History  . Occupation: Conservation officer, nature  owner-retired    Employer: [employer]  Tobacco Use  . Smoking status: Never Smoker  . Smokeless tobacco: Never Used  Vaping Use  . Vaping Use: Never used  Substance and Sexual Activity  . Alcohol use: No  . Drug use: No  . Sexual activity: Not Currently  Other Topics Concern  . Not on file  Social History Narrative   Patient is right-handed. She lives with her husband in a 4 story home. They take care of their grown son with down syndrome who has had a stroke. She drinks 1-2 glasses of tea in the evenings. She does not formally exercise.   Social Determinants of Health   Financial Resource Strain:   . Difficulty of Paying Living Expenses: Not on file  Food Insecurity:   . Worried About Charity fundraiser in the Last Year: Not on file  . Ran Out of Food in the Last Year: Not on file  Transportation Needs:   . Lack of Transportation (Medical): Not on file  . Lack of Transportation (Non-Medical): Not on file  Physical Activity:   . Days of Exercise per Week: Not on file  . Minutes of Exercise per Session: Not on file  Stress:   . Feeling of Stress : Not on file  Social Connections:   . Frequency of Communication with Friends and Family: Not on file  . Frequency of Social Gatherings with Friends and Family: Not on file  . Attends  Religious Services: Not on file  . Active Member of Clubs or Organizations: Not on file  . Attends Archivist Meetings: Not on file  . Marital Status: Not on file  Intimate Partner Violence:   . Fear of Current or Ex-Partner: Not on file  . Emotionally Abused: Not on file  . Physically Abused: Not on file  . Sexually Abused: Not on file    Family History  Problem Relation Age of Onset  . Breast cancer Mother        sarcoma  . Lung cancer Mother   . Hypertension Mother   . Prostate cancer Father   . Congestive Heart Failure Father   . Heart attack Father   . Prostate cancer Brother   . Down syndrome Son   . CVA Son   . Heart disease Maternal Grandfather        MI  . Stomach cancer Maternal Aunt   . Uterine cancer Maternal Aunt   . Colon cancer Neg Hx     ROS: no fevers or chills, productive cough, hemoptysis, dysphasia, odynophagia, melena, hematochezia, dysuria, hematuria, rash, seizure activity, orthopnea, PND, pedal edema, claudication. Remaining systems are negative.  Physical Exam: Well-developed well-nourished in no acute distress.  Skin is warm and dry.  HEENT is normal.  Neck is supple.  Chest is clear to auscultation with normal expansion.  Cardiovascular exam is regular rate and rhythm.  Abdominal exam nontender or distended. No masses palpated. Extremities show no edema. neuro grossly intact  ECG-sinus rhythm at a rate of 77, no ST changes.  Personally reviewed  A/P  1 nonischemic cardiomyopathy-LV function improved on most recent echocardiogram.  Continue present medications including ARB and beta-blocker.  We will repeat echocardiogram.  2 hypertension-patient's blood pressure is controlled.  Continue present medications.  3 hyperlipidemia-continue statin.  Kirk Ruths, MD

## 2019-12-05 DIAGNOSIS — H25013 Cortical age-related cataract, bilateral: Secondary | ICD-10-CM | POA: Diagnosis not present

## 2019-12-05 DIAGNOSIS — H2511 Age-related nuclear cataract, right eye: Secondary | ICD-10-CM | POA: Diagnosis not present

## 2019-12-05 DIAGNOSIS — H2512 Age-related nuclear cataract, left eye: Secondary | ICD-10-CM | POA: Diagnosis not present

## 2019-12-05 DIAGNOSIS — H25042 Posterior subcapsular polar age-related cataract, left eye: Secondary | ICD-10-CM | POA: Diagnosis not present

## 2019-12-10 ENCOUNTER — Other Ambulatory Visit: Payer: Self-pay | Admitting: Cardiology

## 2019-12-11 ENCOUNTER — Ambulatory Visit (INDEPENDENT_AMBULATORY_CARE_PROVIDER_SITE_OTHER): Payer: Medicare Other | Admitting: Cardiology

## 2019-12-11 ENCOUNTER — Other Ambulatory Visit: Payer: Self-pay

## 2019-12-11 ENCOUNTER — Encounter: Payer: Self-pay | Admitting: Cardiology

## 2019-12-11 VITALS — BP 117/73 | HR 77 | Temp 97.2°F | Ht 62.0 in | Wt 184.4 lb

## 2019-12-11 DIAGNOSIS — I420 Dilated cardiomyopathy: Secondary | ICD-10-CM | POA: Diagnosis not present

## 2019-12-11 DIAGNOSIS — E7800 Pure hypercholesterolemia, unspecified: Secondary | ICD-10-CM

## 2019-12-11 DIAGNOSIS — I10 Essential (primary) hypertension: Secondary | ICD-10-CM

## 2019-12-11 NOTE — Patient Instructions (Signed)

## 2020-01-02 DIAGNOSIS — H2512 Age-related nuclear cataract, left eye: Secondary | ICD-10-CM | POA: Diagnosis not present

## 2020-01-12 ENCOUNTER — Ambulatory Visit (HOSPITAL_COMMUNITY): Payer: Medicare Other | Attending: Cardiology

## 2020-01-12 ENCOUNTER — Other Ambulatory Visit: Payer: Self-pay

## 2020-01-12 DIAGNOSIS — I420 Dilated cardiomyopathy: Secondary | ICD-10-CM | POA: Diagnosis not present

## 2020-01-12 LAB — ECHOCARDIOGRAM COMPLETE
Area-P 1/2: 3.65 cm2
S' Lateral: 3 cm

## 2020-01-18 ENCOUNTER — Other Ambulatory Visit: Payer: Self-pay | Admitting: Family Medicine

## 2020-01-20 ENCOUNTER — Other Ambulatory Visit: Payer: Self-pay | Admitting: Family Medicine

## 2020-01-20 ENCOUNTER — Telehealth: Payer: Self-pay | Admitting: *Deleted

## 2020-01-20 MED ORDER — CLONAZEPAM 0.5 MG PO TABS: 0.2500 mg | ORAL_TABLET | Freq: Two times a day (BID) | ORAL | 0 refills | Status: DC | PRN

## 2020-01-20 NOTE — Telephone Encounter (Signed)
I have sent in a refill but she will need a follow up appt for any further refills

## 2020-01-20 NOTE — Telephone Encounter (Signed)
Requesting: Clonazepam Contract: 08/26/19 UDS: 08/26/19 Last Visit: 08/26/19 Next Visit: n/a Last Refill: 10/25/19  Please Advise

## 2020-01-21 NOTE — Telephone Encounter (Signed)
Patient scheduled.

## 2020-02-10 ENCOUNTER — Ambulatory Visit (INDEPENDENT_AMBULATORY_CARE_PROVIDER_SITE_OTHER): Payer: Medicare Other | Admitting: Family Medicine

## 2020-02-10 ENCOUNTER — Other Ambulatory Visit: Payer: Self-pay

## 2020-02-10 ENCOUNTER — Ambulatory Visit (HOSPITAL_BASED_OUTPATIENT_CLINIC_OR_DEPARTMENT_OTHER)
Admission: RE | Admit: 2020-02-10 | Discharge: 2020-02-10 | Disposition: A | Discharge status: Home / Self Care | Payer: Medicare Other | Source: Ambulatory Visit | Attending: Family Medicine | Admitting: Family Medicine

## 2020-02-10 ENCOUNTER — Encounter: Payer: Self-pay | Admitting: Family Medicine

## 2020-02-10 VITALS — BP 110/64 | HR 91 | Temp 98.3°F | Resp 16 | Wt 181.6 lb

## 2020-02-10 DIAGNOSIS — E782 Mixed hyperlipidemia: Secondary | ICD-10-CM | POA: Diagnosis not present

## 2020-02-10 DIAGNOSIS — M810 Age-related osteoporosis without current pathological fracture: Secondary | ICD-10-CM

## 2020-02-10 DIAGNOSIS — R7303 Prediabetes: Secondary | ICD-10-CM | POA: Diagnosis not present

## 2020-02-10 DIAGNOSIS — R350 Frequency of micturition: Secondary | ICD-10-CM

## 2020-02-10 DIAGNOSIS — I10 Essential (primary) hypertension: Secondary | ICD-10-CM | POA: Diagnosis not present

## 2020-02-10 DIAGNOSIS — E871 Hypo-osmolality and hyponatremia: Secondary | ICD-10-CM | POA: Diagnosis not present

## 2020-02-10 DIAGNOSIS — M25562 Pain in left knee: Secondary | ICD-10-CM

## 2020-02-10 DIAGNOSIS — F418 Other specified anxiety disorders: Secondary | ICD-10-CM

## 2020-02-10 DIAGNOSIS — K921 Melena: Secondary | ICD-10-CM | POA: Diagnosis not present

## 2020-02-10 DIAGNOSIS — E559 Vitamin D deficiency, unspecified: Secondary | ICD-10-CM

## 2020-02-10 DIAGNOSIS — G8929 Other chronic pain: Secondary | ICD-10-CM

## 2020-02-10 DIAGNOSIS — M545 Low back pain, unspecified: Secondary | ICD-10-CM

## 2020-02-10 DIAGNOSIS — E669 Obesity, unspecified: Secondary | ICD-10-CM | POA: Diagnosis not present

## 2020-02-10 LAB — URINALYSIS, ROUTINE W REFLEX MICROSCOPIC
Bilirubin Urine: NEGATIVE
Hgb urine dipstick: NEGATIVE
Ketones, ur: NEGATIVE
Nitrite: NEGATIVE
RBC / HPF: NONE SEEN (ref 0–?)
Specific Gravity, Urine: <=1.005 — AB (ref 1.000–1.030)
Total Protein, Urine: NEGATIVE
Urine Glucose: NEGATIVE
Urobilinogen, UA: 0.2 (ref 0.0–1.0)
pH: 6.5 (ref 5.0–8.0)

## 2020-02-10 MED ORDER — AMOXICILLIN 500 MG PO CAPS: 500.0000 mg | ORAL_CAPSULE | Freq: Three times a day (TID) | ORAL | 0 refills | Status: DC

## 2020-02-10 NOTE — Patient Instructions (Signed)
60-80 ounces of clear fluids daily Lean protein every 4-6 hours 6-8 hours of sleep   Alternating moist heat and ice then applying topical rubs such as Lidocaine gel and/or Voltaren   Cervicogenic Headache  A cervicogenic headache is a headache caused by a condition that affects the bones and tissues in your neck (cervical spine). In a cervicogenic headache, the pain moves from your neck to your head. Most cervicogenic headaches start in the upper part of the neck with the first three cervical bones (cervical vertebrae). A cervicogenic headache is diagnosed when a cause can be found in the cervical spine and other causes of headaches can be ruled out. What are the causes? The most common cause of this condition is a traumatic injury to the cervical spine, such as whiplash. Other causes include:  Arthritis.  Broken bone (fracture).  Infection.  Tumor. What are the signs or symptoms? The most common symptoms are neck and head pain. The pain is often located on one side. In some cases, there may be head pain without neck pain. Pain may be felt in the neck, back or side of the head, face, or behind the eyes. Other symptoms include:  Limited movement in the neck.  Arm or shoulder pain. How is this diagnosed? This condition may be diagnosed based on:  Your symptoms.  A physical exam.  An injection that blocks nerve signals (nerve block).  Imaging tests, such as: ? X-rays. ? CT scan. ? MRI. How is this treated? Treatment for this condition may depend on the underlying condition. Treatment may include:  Medicines, such as: ? NSAIDs. ? Muscle relaxants.  Physical therapy.  Massage therapy.  Complementary therapies, such as: ? Biofeedback. ? Meditation. ? Acupuncture.  Nerve block injections.  Botulinum toxin injections. Your treatment plan may involve working with a pain management team that includes your primary health care provider, a pain management specialist,  a neurologist, and a physical therapist. Follow these instructions at home:  Take over-the-counter and prescription medicines only as told by your health care provider.  Do exercises at home as told by your physical therapist.  Return to your normal activities as told by your health care provider. Ask your health care provider what activities are safe for you. Avoid activities that trigger your headaches.  Maintain good neck support and posture at home and at work.  Keep all follow-up visits as told by your health care provider. This is important. Contact a health care provider if you have:  Headaches that are getting worse and happening more often.  Headaches with any of the following: ? Fever. ? Numbness. ? Weakness. ? Dizziness. ? Nausea or vomiting. Get help right away if:  You have a very sudden and severe headache. Summary  A cervicogenic headache is a headache caused by a condition that affects the bones and tissues in your cervical spine.  Your health care provider may diagnose this condition with a physical exam, a nerve block, and imaging tests.  Treatment may include medicine to reduce pain and inflammation, physical therapy, and nerve block injections.  Complementary therapies, such as acupuncture and meditation, may be added to other treatments.  Your treatment plan may involve working with a pain management team that includes your primary health care provider, a pain management specialist, a neurologist, and a physical therapist. This information is not intended to replace advice given to you by your health care provider. Make sure you discuss any questions you have with your health care  provider. Document Revised: 11/27/2019 Document Reviewed: 11/27/2019 Elsevier Patient Education  2021 [employer].

## 2020-02-11 ENCOUNTER — Other Ambulatory Visit: Payer: Self-pay | Admitting: Family Medicine

## 2020-02-11 ENCOUNTER — Other Ambulatory Visit: Payer: Self-pay | Admitting: Cardiology

## 2020-02-11 DIAGNOSIS — M545 Low back pain, unspecified: Secondary | ICD-10-CM

## 2020-02-11 DIAGNOSIS — K921 Melena: Secondary | ICD-10-CM | POA: Insufficient documentation

## 2020-02-11 DIAGNOSIS — R350 Frequency of micturition: Secondary | ICD-10-CM

## 2020-02-11 HISTORY — DX: Frequency of micturition: R35.0

## 2020-02-11 LAB — CBC
HCT: 38 % (ref 36.0–46.0)
Hemoglobin: 12.4 g/dL (ref 12.0–15.0)
MCHC: 32.5 g/dL (ref 30.0–36.0)
MCV: 89.6 fl (ref 78.0–100.0)
Platelets: 286 10*3/uL (ref 150.0–400.0)
RBC: 4.24 Mil/uL (ref 3.87–5.11)
RDW: 14.3 % (ref 11.5–15.5)
WBC: 9.5 10*3/uL (ref 4.0–10.5)

## 2020-02-11 LAB — HEMOGLOBIN A1C: Hgb A1c MFr Bld: 5.8 % (ref 4.6–6.5)

## 2020-02-11 LAB — COMPREHENSIVE METABOLIC PANEL
ALT: 18 U/L (ref 0–35)
AST: 19 U/L (ref 0–37)
Albumin: 4.2 g/dL (ref 3.5–5.2)
Alkaline Phosphatase: 104 U/L (ref 39–117)
BUN: 12 mg/dL (ref 6–23)
CO2: 32 mEq/L (ref 19–32)
Calcium: 9.2 mg/dL (ref 8.4–10.5)
Chloride: 107 mEq/L (ref 96–112)
Creatinine, Ser: 0.8 mg/dL (ref 0.40–1.20)
GFR: 71.94 mL/min (ref >60.00)
Glucose, Bld: 92 mg/dL (ref 70–99)
Potassium: 4.1 mEq/L (ref 3.5–5.1)
Sodium: 144 mEq/L (ref 135–145)
Total Bilirubin: 0.3 mg/dL (ref 0.2–1.2)
Total Protein: 6.5 g/dL (ref 6.0–8.3)

## 2020-02-11 LAB — LIPID PANEL
Cholesterol: 177 mg/dL (ref 0–200)
HDL: 49.1 mg/dL (ref >39.00)
NonHDL: 127.79
Total CHOL/HDL Ratio: 4
Triglycerides: 239 mg/dL — ABNORMAL HIGH (ref 0.0–149.0)
VLDL: 47.8 mg/dL — ABNORMAL HIGH (ref 0.0–40.0)

## 2020-02-11 LAB — VITAMIN D 25 HYDROXY (VIT D DEFICIENCY, FRACTURES): VITD: 31.24 ng/mL (ref 30.00–100.00)

## 2020-02-11 LAB — TSH: TSH: 2.26 u[IU]/mL (ref 0.35–4.50)

## 2020-02-11 LAB — LDL CHOLESTEROL, DIRECT: Direct LDL: 101 mg/dL

## 2020-02-11 NOTE — Assessment & Plan Note (Signed)
Check UA and culture 

## 2020-02-11 NOTE — Assessment & Plan Note (Signed)
Encouraged DASH or MIND diet, decrease po intake and increase exercise as tolerated. Needs 7-8 hours of sleep nightly. Avoid trans fats, eat small, frequent meals every 4-5 hours with lean proteins, complex carbs and healthy fats. Minimize simple carbs, GMO foods. 

## 2020-02-11 NOTE — Assessment & Plan Note (Signed)
[id number] acceptable, minimize simple carbs. Increase exercise as tolerated.  

## 2020-02-11 NOTE — Assessment & Plan Note (Signed)
Well controlled, no changes to meds. Encouraged heart healthy diet such as the DASH diet and exercise as tolerated.  °

## 2020-02-11 NOTE — Assessment & Plan Note (Signed)
Encouraged moist heat and gentle stretching as tolerated. May try NSAIDs and prescription meds as directed and report if symptoms worsen or seek immediate care check xray

## 2020-02-11 NOTE — Progress Notes (Signed)
Patient ID: Shannon [name], female   DOB: 1945-02-04, 75 y.o.   MRN: [id number]   Subjective:    Patient ID: Shannon [name], female    DOB: 1945/07/17, 75 y.o.   MRN: [id number]  Chief Complaint  Patient presents with  . Follow-up    HPI Patient is in today for follow up on chronic medical concerns. No recent febrile illness or hospitalizations. She is sad today as we are at the one year mark since her son died. She notes anhedonia but no suicidal ideation. She does also note some urinary frequency but no polydipsia. She is not exercising but she is trying to at well. Denies CP/palp/SOB/HA/congestion/fevers c/o. Taking meds as prescribed. She also notes blood in her stool at times. Small amounts and unpredictable.   Past Medical History:  Diagnosis Date  . 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 Anemia 08/28/2011  . Asthma    as a child  . Brain tumor ([place])   . Breast cancer ([place])    right breast  . Carpal tunnel syndrome of left wrist   . Cataracts, bilateral 12/28/2015  . Constipation 03/03/2012  . DEPRESSION 08/11/2008  . Dermatitis   . Facial fracture ([place])   . GERD 08/11/2008  . H/O measles   . H/O mumps   . History of blood transfusion   . History of chicken pox   . History of hiatal hernia   . 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 HYPERLIPIDEMIA 08/11/2008  . HYPERTENSION 08/11/2008  . Hyponatremia 08/28/2011  . 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 Interstitial cystitis   . LIPOMA 02/10/2009  . Medicare annual wellness visit, subsequent 12/27/2014   Follows with Dr Amalia Hailey of urology Follows with Dr Posey Pronto at [facility] for opthamology Follows with Dr [name] of dermatology Follows with [facility] gastroenterology, last scope done in 2011 repeat in 10 years Last [facility] July 2015 should repeat every 1-2 years Last Pap December 2015, repeat in 3 years.   . Melanoma ([place]) 2008   DR. Ronnald Ramp  . Neuropathy   . NICM  (nonischemic cardiomyopathy) ([place]) 03/08/2010   likely 2/2 chemotx - a. Echo 2012: EF 45-50%;  b. Lex MV 2/12:  low risk, apical defect (small area of ischemia vs shifting breast atten);  c.  Echo 7/12: Normal wall thickness, EF 60-65%, normal wall motion, grade 1 diastolic dysfunction, mild LAE, PASP 32;   d. Lex MV 11/13:  EF 76%, no ischemia  . OA (osteoarthritis) 09/07/2011  . Obesity 09/28/2014  . Osteoporosis   . Personal history of chemotherapy 2012  . Personal history of radiation therapy 2012  . PREDIABETES 06/15/2009  . Recurrent falls 12/28/2015  . Shortness of breath   . UTI (lower urinary tract infection) 03/03/2012  . Vertebral fracture, osteoporotic, sequela 04/05/2016    Past Surgical History:  Procedure Laterality Date  . BREAST LUMPECTOMY  04/2009   RIGHT FOR BREAST CANCER-CHEMO/RADIATION X 1 YEAR  . BREAST REDUCTION SURGERY Bilateral 05/04/2014   Procedure: MAMMARY REDUCTION  (BREAST);  Surgeon: Cristine Polio, MD;  Location: [facility];  Service: Plastics;  Laterality: Bilateral;  . CARPAL TUNNEL RELEASE     L wrist, ulnar nerve moved  . CHOLECYSTECTOMY    . CRANIOTOMY FOR TUMOR  2000  . ELBOW SURGERY     left  . KNEE ARTHROSCOPY Left 10/12/2014  Procedure: LEFT KNEE ARTHROSCOPY ;  Surgeon: Gaynelle Arabian, MD;  Location: [facility];  Service: Orthopedics;  Laterality: Left;  . KYPHOPLASTY N/A 03/30/2016   Procedure: THORACIC 12 KYPHOPLASTY;  Surgeon: Phylliss Bob, MD;  Location: [facility];  Service: Orthopedics;  Laterality: N/A;  THORACIC 12 KYPHOPLASTY  . LEFT HEART CATHETERIZATION WITH CORONARY ANGIOGRAM N/A 12/16/2013   Procedure: LEFT HEART CATHETERIZATION WITH CORONARY ANGIOGRAM;  Surgeon: Peter M [name], MD;  Location: [facility];  Service: Cardiovascular;  Laterality: N/A;  . LIPOMA EXCISION  03/28/2009   right leg  . MELANOMA EXCISION    . PORT-A-CATH REMOVAL  11/30/2010   [place]reck  . porta cath    . PORTACATH PLACEMENT  may 2011  . REDUCTION MAMMAPLASTY  Bilateral   . SYNOVECTOMY Left 10/12/2014   Procedure: WITH SYNOVECTOMY;  Surgeon: Gaynelle Arabian, MD;  Location: [facility];  Service: Orthopedics;  Laterality: Left;  . TONSILLECTOMY  1958  . TOTAL KNEE ARTHROPLASTY  02/05/2012   Procedure: TOTAL KNEE ARTHROPLASTY;  Surgeon: Gearlean Alf, MD;  Location: [facility];  Service: Orthopedics;  Laterality: Right;  . TOTAL KNEE ARTHROPLASTY Left 02/10/2013   Procedure: LEFT TOTAL KNEE ARTHROPLASTY;  Surgeon: Gearlean Alf, MD;  Location: [facility];  Service: Orthopedics;  Laterality: Left;  . total knee raplacement  01-2012   Right Knee  . TUBAL LIGATION  1997    Family History  Problem Relation Age of Onset  . Breast cancer Mother        sarcoma  . Lung cancer Mother   . Hypertension Mother   . Prostate cancer Father   . Congestive Heart Failure Father   . Heart attack Father   . Prostate cancer Brother   . Down syndrome Son   . CVA Son   . Heart disease Maternal Grandfather        MI  . Stomach cancer Maternal Aunt   . Uterine cancer Maternal Aunt   . Colon cancer Neg Hx     Social History   Socioeconomic History  . Marital status: Married    Spouse name: Deidre Ala  . Number of children: 3  . Years of education: Not on file  . Highest education level: Not on file  Occupational History  . Occupation: boutique owner-retired    Employer: [employer]  Tobacco Use  . Smoking status: Never Smoker  . Smokeless tobacco: Never Used  Vaping Use  . Vaping Use: Never used  Substance and Sexual Activity  . Alcohol use: No  . Drug use: No  . Sexual activity: Not Currently  Other Topics Concern  . Not on file  Social History Narrative   Patient is right-handed. She lives with her husband in a 4 story home. They take care of their grown son with down syndrome who has had a stroke. She drinks 1-2 glasses of tea in the evenings. She does not formally exercise.   Social Determinants of Health   Financial Resource Strain: Not on file  Food  Insecurity: Not on file  Transportation Needs: Not on file  Physical Activity: Not on file  Stress: Not on file  Social Connections: Not on file  Intimate Partner Violence: Not on file    Outpatient Medications Prior to Visit  Medication Sig Dispense Refill  . acetaminophen (TYLENOL) 500 MG tablet Take 500-1,000 mg by mouth every 6 (six) hours as needed for moderate pain or headache.     . albuterol (VENTOLIN HFA) 108 (90 Base) MCG/ACT  inhaler Inhale 1-2 puffs into the lungs every 6 (six) hours as needed for wheezing or shortness of breath. 18 g 3  . Ascorbic Acid (VITAMIN C PO) Take 1 tablet by mouth daily.    Marland Kitchen aspirin EC 81 MG tablet Take 81 mg by mouth daily.    . B Complex Vitamins (B COMPLEX PO) Take 1 tablet by mouth daily.    . carvedilol (COREG) 25 MG tablet Take 1 tablet (25 mg total) by mouth 2 (two) times daily with a meal. 180 tablet 1  . clonazePAM (KLONOPIN) 0.5 MG tablet Take 0.5-1 tablets (0.25-0.5 mg total) by mouth 2 (two) times daily as needed for anxiety. 60 tablet 0  . ferrous sulfate 325 (65 FE) MG tablet Take 325 mg by mouth daily with breakfast.    . folic acid (FOLVITE) 1 MG tablet Take 1 tablet (1 mg total) by mouth daily. 90 tablet 0  . gabapentin (NEURONTIN) 300 MG capsule Take 1 capsule (300 mg total) by mouth 2 (two) times daily as needed. 180 capsule 0  . hydrOXYzine (ATARAX/VISTARIL) 25 MG tablet Take 25 mg by mouth at bedtime.    . Multiple Vitamin (MULTIVITAMIN WITH MINERALS) TABS tablet Take 1 tablet by mouth daily.    . pentosan polysulfate (ELMIRON) 100 MG capsule Take 100 mg by mouth 2 (two) times daily.     . rosuvastatin (CRESTOR) 20 MG tablet TAKE 1 TABLET AT BEDTIME 90 tablet 3  . venlafaxine XR (EFFEXOR-XR) 75 MG 24 hr capsule TAKE 2 CAPSULES EVERY DAY WITH BREAKFAST 180 capsule 0  . telmisartan (MICARDIS) 80 MG tablet TAKE 1 TABLET EVERY DAY 90 tablet 0   No facility-administered medications prior to visit.    Allergies  Allergen Reactions   . Dilaudid [Hydromorphone Hcl] Itching    Review of Systems  Constitutional: Positive for malaise/fatigue. Negative for fever.  HENT: Negative for congestion.   Eyes: Negative for blurred vision.  Respiratory: Negative for shortness of breath.   Cardiovascular: Negative for chest pain, palpitations and leg swelling.  Gastrointestinal: Negative for abdominal pain, blood in stool and nausea.  Genitourinary: Positive for frequency and urgency. Negative for dysuria.  Musculoskeletal: Negative for falls.  Skin: Negative for rash.  Neurological: Negative for dizziness, loss of consciousness and headaches.  Endo/Heme/Allergies: Negative for environmental allergies.  Psychiatric/Behavioral: Positive for depression. The patient is nervous/anxious.        Objective:    Physical Exam Vitals and nursing note reviewed.  Constitutional:      General: She is not in acute distress.    Appearance: She is well-developed and well-nourished.  HENT:     Head: Normocephalic and atraumatic.     Nose: Nose normal.  Eyes:     General:        Right eye: No discharge.        Left eye: No discharge.  Cardiovascular:     Rate and Rhythm: Normal rate and regular rhythm.     Heart sounds: No murmur heard.   Pulmonary:     Effort: Pulmonary effort is normal.     Breath sounds: Normal breath sounds.  Abdominal:     General: Bowel sounds are normal.     Palpations: Abdomen is soft.     Tenderness: There is no abdominal tenderness.  Musculoskeletal:        General: No edema.     Cervical back: Normal range of motion and neck supple.  Skin:    General: Skin is  warm and dry.  Neurological:     Mental Status: She is alert and oriented to person, place, and time.  Psychiatric:        Mood and Affect: Mood and affect normal.     BP 110/64   Pulse 91   Temp 98.3 F (36.8 C) (Oral)   Resp 16   Wt 181 lb 9.6 oz (82.4 kg)   LMP 01/30/1994   SpO2 98%   BMI 33.22 kg/m  Wt Readings from Last 3  Encounters:  02/10/20 181 lb 9.6 oz (82.4 kg)  12/11/19 184 lb 6.4 oz (83.6 kg)  09/26/19 185 lb 3.2 oz (84 kg)    Diabetic Foot Exam - Simple   No data filed    Lab Results  Component Value Date   WBC 9.5 02/10/2020   HGB 12.4 02/10/2020   HCT 38.0 02/10/2020   PLT 286.0 02/10/2020   GLUCOSE 92 02/10/2020   CHOL 177 02/10/2020   TRIG 239.0 (H) 02/10/2020   HDL 49.10 02/10/2020   LDLDIRECT 101.0 02/10/2020   LDLCALC 80 06/05/2018   ALT 18 02/10/2020   AST 19 02/10/2020   NA 144 02/10/2020   K 4.1 02/10/2020   CL 107 02/10/2020   CREATININE 0.80 02/10/2020   BUN 12 02/10/2020   CO2 32 02/10/2020   TSH 2.26 02/10/2020   INR 1.06 03/30/2016   HGBA1C 5.8 02/10/2020    Lab Results  Component Value Date   TSH 2.26 02/10/2020   Lab Results  Component Value Date   WBC 9.5 02/10/2020   HGB 12.4 02/10/2020   HCT 38.0 02/10/2020   MCV 89.6 02/10/2020   PLT 286.0 02/10/2020   Lab Results  Component Value Date   NA 144 02/10/2020   K 4.1 02/10/2020   CHLORIDE 107 08/07/2016   CO2 32 02/10/2020   GLUCOSE 92 02/10/2020   BUN 12 02/10/2020   CREATININE 0.80 02/10/2020   BILITOT 0.3 02/10/2020   ALKPHOS 104 02/10/2020   AST 19 02/10/2020   ALT 18 02/10/2020   PROT 6.5 02/10/2020   ALBUMIN 4.2 02/10/2020   CALCIUM 9.2 02/10/2020   ANIONGAP 8 12/14/2017   EGFR 72 (L) 08/07/2016   GFR 71.94 02/10/2020   Lab Results  Component Value Date   CHOL 177 02/10/2020   Lab Results  Component Value Date   HDL 49.10 02/10/2020   Lab Results  Component Value Date   LDLCALC 80 06/05/2018   Lab Results  Component Value Date   TRIG 239.0 (H) 02/10/2020   Lab Results  Component Value Date   CHOLHDL 4 02/10/2020   Lab Results  Component Value Date   HGBA1C 5.8 02/10/2020       Assessment & Plan:   Problem List Items Addressed This Visit    Hyperlipidemia, mixed   Relevant Orders   Lipid panel (Completed)   Depression with anxiety    She is sad today as  it has been a year since her special needs son died. She does no feel she wants a change in therapy at this time. She will notify us if she changes her mind.      Essential hypertension    Well controlled, no changes to meds. Encouraged heart healthy diet such as the DASH diet and exercise as tolerated.       Relevant Orders   TSH (Completed)   Osteoporosis   Prediabetes    hgba1c acceptable, minimize simple carbs. Increase exercise as tolerated.  Relevant Orders   Hemoglobin A1c (Completed)   Hyponatremia   Relevant Orders   Comprehensive metabolic panel (Completed)   Obesity    Encouraged DASH or MIND diet, decrease po intake and increase exercise as tolerated. Needs 7-8 hours of sleep nightly. Avoid trans fats, eat small, frequent meals every 4-5 hours with lean proteins, complex carbs and healthy fats. Minimize simple carbs, GMO foods.      Left-sided back pain    Encouraged moist heat and gentle stretching as tolerated. May try NSAIDs and prescription meds as directed and report if symptoms worsen or seek immediate care check xray      Vitamin D deficiency    Supplement and monitor      Relevant Orders   VITAMIN D 25 Hydroxy (Vit-D Deficiency, Fractures) (Completed)   Left knee pain - Primary    Is struggling with increasing left knee pain. Is referred back to ortho for further evaluation      Relevant Orders   Ambulatory referral to Orthopedic Surgery   Urinary frequency    Check UA and culture       Relevant Orders   Urinalysis   Urine Culture   Blood in stool    Intermittent, is referred to gastroenterology      Relevant Orders   Ambulatory referral to Gastroenterology   Fecal occult blood, imunochemical    Other Visit Diagnoses    Low back pain, unspecified back pain laterality, unspecified chronicity, unspecified whether sciatica present       Relevant Orders   CBC (Completed)   DG Lumbar Spine 2-3 Views (Completed)      I am having Alycia Patten. [place] start on amoxicillin. I am also having her maintain her hydrOXYzine, pentosan polysulfate, acetaminophen, ferrous sulfate, multivitamin with minerals, aspirin EC, carvedilol, gabapentin, B Complex Vitamins (B COMPLEX PO), Ascorbic Acid (VITAMIN C PO), albuterol, venlafaxine XR, rosuvastatin, folic acid, and clonazePAM.  Meds ordered this encounter  Medications  . amoxicillin (AMOXIL) 500 MG capsule    Sig: Take 1 capsule (500 mg total) by mouth 3 (three) times daily.    Dispense:  30 capsule    Refill:  0     Penni Homans, MD

## 2020-02-11 NOTE — Assessment & Plan Note (Signed)
Is struggling with increasing left knee pain. Is referred back to ortho for further evaluation

## 2020-02-11 NOTE — Assessment & Plan Note (Signed)
Intermittent, is referred to gastroenterology

## 2020-02-11 NOTE — Assessment & Plan Note (Signed)
She is sad today as it has been a year since her special needs son died. She does no feel she wants a change in therapy at this time. She will notify us if she changes her mind.

## 2020-02-11 NOTE — Assessment & Plan Note (Signed)
Supplement and monitor 

## 2020-02-12 LAB — URINE CULTURE
MICRO NUMBER:: 11404738
SPECIMEN QUALITY:: ADEQUATE

## 2020-02-13 ENCOUNTER — Telehealth: Payer: Self-pay | Admitting: Family Medicine

## 2020-02-13 ENCOUNTER — Other Ambulatory Visit: Payer: Self-pay | Admitting: *Deleted

## 2020-02-13 MED ORDER — AMOXICILLIN 500 MG PO CAPS: 500.0000 mg | ORAL_CAPSULE | Freq: Three times a day (TID) | ORAL | 0 refills | Status: DC

## 2020-02-13 NOTE — Telephone Encounter (Signed)
Medication sent to incorrect pharmacy , patient would like to use [facility] in [place]   amoxicillin (AMOXIL) 500 MG capsule [[id number]]

## 2020-02-14 ENCOUNTER — Other Ambulatory Visit: Payer: Self-pay

## 2020-02-14 ENCOUNTER — Ambulatory Visit (HOSPITAL_BASED_OUTPATIENT_CLINIC_OR_DEPARTMENT_OTHER)
Admission: RE | Admit: 2020-02-14 | Discharge: 2020-02-14 | Disposition: A | Discharge status: Home / Self Care | Payer: Medicare Other | Source: Ambulatory Visit | Attending: Family Medicine | Admitting: Family Medicine

## 2020-02-14 DIAGNOSIS — M545 Low back pain, unspecified: Secondary | ICD-10-CM

## 2020-02-15 ENCOUNTER — Other Ambulatory Visit: Payer: Self-pay | Admitting: Family Medicine

## 2020-02-15 ENCOUNTER — Other Ambulatory Visit: Payer: Self-pay | Admitting: Cardiology

## 2020-02-15 DIAGNOSIS — M545 Low back pain, unspecified: Secondary | ICD-10-CM

## 2020-02-16 NOTE — Telephone Encounter (Signed)
That was where Rx has been sent?

## 2020-02-18 ENCOUNTER — Emergency Department (HOSPITAL_BASED_OUTPATIENT_CLINIC_OR_DEPARTMENT_OTHER): Payer: Medicare Other

## 2020-02-18 ENCOUNTER — Emergency Department (HOSPITAL_BASED_OUTPATIENT_CLINIC_OR_DEPARTMENT_OTHER)
Admission: EM | Admit: 2020-02-18 | Discharge: 2020-02-18 | Disposition: A | Discharge status: Home / Self Care | Payer: Medicare Other | Attending: Emergency Medicine | Admitting: Emergency Medicine

## 2020-02-18 ENCOUNTER — Other Ambulatory Visit: Payer: Self-pay

## 2020-02-18 ENCOUNTER — Encounter (HOSPITAL_BASED_OUTPATIENT_CLINIC_OR_DEPARTMENT_OTHER): Payer: Self-pay | Admitting: *Deleted

## 2020-02-18 DIAGNOSIS — R410 Disorientation, unspecified: Secondary | ICD-10-CM

## 2020-02-18 DIAGNOSIS — Z96651 Presence of right artificial knee joint: Secondary | ICD-10-CM | POA: Insufficient documentation

## 2020-02-18 DIAGNOSIS — Z96652 Presence of left artificial knee joint: Secondary | ICD-10-CM | POA: Diagnosis not present

## 2020-02-18 DIAGNOSIS — R4182 Altered mental status, unspecified: Secondary | ICD-10-CM | POA: Insufficient documentation

## 2020-02-18 DIAGNOSIS — Z7982 Long term (current) use of aspirin: Secondary | ICD-10-CM | POA: Diagnosis not present

## 2020-02-18 DIAGNOSIS — Z79899 Other long term (current) drug therapy: Secondary | ICD-10-CM | POA: Diagnosis not present

## 2020-02-18 DIAGNOSIS — N3000 Acute cystitis without hematuria: Secondary | ICD-10-CM

## 2020-02-18 DIAGNOSIS — I10 Essential (primary) hypertension: Secondary | ICD-10-CM | POA: Diagnosis not present

## 2020-02-18 DIAGNOSIS — Z853 Personal history of malignant neoplasm of breast: Secondary | ICD-10-CM | POA: Diagnosis not present

## 2020-02-18 DIAGNOSIS — J45909 Unspecified asthma, uncomplicated: Secondary | ICD-10-CM | POA: Insufficient documentation

## 2020-02-18 DIAGNOSIS — R443 Hallucinations, unspecified: Secondary | ICD-10-CM | POA: Insufficient documentation

## 2020-02-18 DIAGNOSIS — J9811 Atelectasis: Secondary | ICD-10-CM | POA: Diagnosis not present

## 2020-02-18 DIAGNOSIS — Z85841 Personal history of malignant neoplasm of brain: Secondary | ICD-10-CM | POA: Diagnosis not present

## 2020-02-18 LAB — CBC WITH DIFFERENTIAL/PLATELET
Abs Immature Granulocytes: 0.07 10*3/uL (ref 0.00–0.07)
Basophils Absolute: 0.1 10*3/uL (ref 0.0–0.1)
Basophils Relative: 1 %
Eosinophils Absolute: 0.1 10*3/uL (ref 0.0–0.5)
Eosinophils Relative: 1 %
HCT: 38.4 % (ref 36.0–46.0)
Hemoglobin: 12.6 g/dL (ref 12.0–15.0)
Immature Granulocytes: 1 %
Lymphocytes Relative: 17 %
Lymphs Abs: 1.6 10*3/uL (ref 0.7–4.0)
MCH: 29.3 pg (ref 26.0–34.0)
MCHC: 32.8 g/dL (ref 30.0–36.0)
MCV: 89.3 fL (ref 80.0–100.0)
Monocytes Absolute: 0.9 10*3/uL (ref 0.1–1.0)
Monocytes Relative: 9 %
Neutro Abs: 6.7 10*3/uL (ref 1.7–7.7)
Neutrophils Relative %: 71 %
Platelets: 284 10*3/uL (ref 150–400)
RBC: 4.3 MIL/uL (ref 3.87–5.11)
RDW: 13.2 % (ref 11.5–15.5)
WBC: 9.4 10*3/uL (ref 4.0–10.5)
nRBC: 0 % (ref 0.0–0.2)

## 2020-02-18 LAB — COMPREHENSIVE METABOLIC PANEL
ALT: 28 U/L (ref 0–44)
AST: 43 U/L — ABNORMAL HIGH (ref 15–41)
Albumin: 4.3 g/dL (ref 3.5–5.0)
Alkaline Phosphatase: 86 U/L (ref 38–126)
Anion gap: 11 (ref 5–15)
BUN: 16 mg/dL (ref 8–23)
CO2: 26 mmol/L (ref 22–32)
Calcium: 9.3 mg/dL (ref 8.9–10.3)
Chloride: 99 mmol/L (ref 98–111)
Creatinine, Ser: 0.86 mg/dL (ref 0.44–1.00)
GFR, Estimated: >60 mL/min (ref >60)
Glucose, Bld: 115 mg/dL — ABNORMAL HIGH (ref 70–99)
Potassium: 3.7 mmol/L (ref 3.5–5.1)
Sodium: 136 mmol/L (ref 135–145)
Total Bilirubin: 0.5 mg/dL (ref 0.3–1.2)
Total Protein: 7.4 g/dL (ref 6.5–8.1)

## 2020-02-18 LAB — URINALYSIS, MICROSCOPIC (REFLEX)

## 2020-02-18 LAB — URINALYSIS, ROUTINE W REFLEX MICROSCOPIC
Bilirubin Urine: NEGATIVE
Glucose, UA: NEGATIVE mg/dL
Hgb urine dipstick: NEGATIVE
Ketones, ur: NEGATIVE mg/dL
Nitrite: NEGATIVE
Protein, ur: NEGATIVE mg/dL
Specific Gravity, Urine: 1.01 (ref 1.005–1.030)
pH: 5 (ref 5.0–8.0)

## 2020-02-18 MED ORDER — ACETAMINOPHEN 325 MG PO TABS
650.0000 mg | ORAL_TABLET | Freq: Once | ORAL | Status: AC
  Administered 2020-02-18: 650 mg via ORAL
  Filled 2020-02-18: qty 2

## 2020-02-18 MED ORDER — CEPHALEXIN 500 MG PO CAPS: 500.0000 mg | ORAL_CAPSULE | Freq: Two times a day (BID) | ORAL | 0 refills | Status: AC

## 2020-02-18 MED ORDER — CEPHALEXIN 250 MG PO CAPS
250.0000 mg | ORAL_CAPSULE | Freq: Once | ORAL | Status: AC
  Administered 2020-02-18: 250 mg via ORAL
  Filled 2020-02-18: qty 1

## 2020-02-18 MED ORDER — SODIUM CHLORIDE 0.9 % IV BOLUS
1000.0000 mL | Freq: Once | INTRAVENOUS | Status: AC
  Administered 2020-02-18: 1000 mL via INTRAVENOUS

## 2020-02-18 NOTE — ED Triage Notes (Signed)
Pt alert oriented x 3 , daughter reports hallucinations x 2 days , HX brain tumor

## 2020-02-18 NOTE — ED Provider Notes (Signed)
[facility] EMERGENCY DEPARTMENT Provider Note   CSN: [id number] Arrival date & time: 02/18/20  1419     History Chief Complaint  Patient presents with  . Altered Mental Status    Shannon [name] is a 75 y.o. female.  The history is provided by the patient.  Altered Mental Status Presenting symptoms: confusion   Severity:  Mild Most recent episode:  Yesterday Episode number: two episodes of visual hallucinations. No hx of same. Accidentally took medications wrong yesterday and posisbly led to confusion. Poor sleep last night. Timing:  Intermittent Progression:  Improving Chronicity:  New Context: not dementia and taking medications as prescribed   Associated symptoms: hallucinations   Associated symptoms: no abdominal pain, no agitation, no fever, no palpitations, no rash, no seizures and no vomiting        Past Medical History:  Diagnosis Date  . 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 Anemia 08/28/2011  . Asthma    as a child  . Brain tumor ([place])   . Breast cancer ([place])    right breast  . Carpal tunnel syndrome of left wrist   . Cataracts, bilateral 12/28/2015  . Constipation 03/03/2012  . DEPRESSION 08/11/2008  . Dermatitis   . Facial fracture ([place])   . GERD 08/11/2008  . H/O measles   . H/O mumps   . History of blood transfusion   . History of chicken pox   . History of hiatal hernia   . 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 HYPERLIPIDEMIA 08/11/2008  . HYPERTENSION 08/11/2008  . Hyponatremia 08/28/2011  . 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 Interstitial cystitis   . LIPOMA 02/10/2009  . Medicare annual wellness visit, subsequent 12/27/2014   Follows with Dr Amalia Hailey of urology Follows with Dr Posey Pronto at [facility] for opthamology Follows with Dr [name] of dermatology Follows with [facility] gastroenterology, last scope done in 2011 repeat in 10 years Last [facility] July 2015 should repeat  every 1-2 years Last Pap December 2015, repeat in 3 years.   . Melanoma ([place]) 2008   DR. Ronnald Ramp  . Neuropathy   . NICM (nonischemic cardiomyopathy) ([place]) 03/08/2010   likely 2/2 chemotx - a. Echo 2012: EF 45-50%;  b. Lex MV 2/12:  low risk, apical defect (small area of ischemia vs shifting breast atten);  c.  Echo 7/12: Normal wall thickness, EF 60-65%, normal wall motion, grade 1 diastolic dysfunction, mild LAE, PASP 32;   d. Lex MV 11/13:  EF 76%, no ischemia  . OA (osteoarthritis) 09/07/2011  . Obesity 09/28/2014  . Osteoporosis   . Personal history of chemotherapy 2012  . Personal history of radiation therapy 2012  . PREDIABETES 06/15/2009  . Recurrent falls 12/28/2015  . Shortness of breath   . UTI (lower urinary tract infection) 03/03/2012  . Vertebral fracture, osteoporotic, sequela 04/05/2016    Patient Active Problem List   Diagnosis Date Noted  . Urinary frequency 02/11/2020  . Blood in stool 02/11/2020  . Other headache syndrome 12/23/2017  . Debility 12/23/2017  . Left knee pain 12/23/2017  . Closed fracture of maxilla, initial encounter ([place]) 10/17/2017  . Vitamin D deficiency 06/14/2017  . Vertebral fracture, osteoporotic, sequela 04/05/2016  . Left-sided back pain 03/06/2016  . Chest pain 02/27/2016  . Syncope 02/27/2016  . Cataracts, bilateral 12/28/2015  . Recurrent falls 12/28/2015  . Genetic testing 11/29/2015  .  Family history- stomach cancer 09/08/2015  . Family history of cancer 09/08/2015  . [name] lesion 06/21/2015  . Left leg swelling 05/24/2015  . Medicare annual wellness visit, subsequent 12/27/2014  . 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 Obesity 09/28/2014  . Interstitial cystitis 09/28/2014  . Pain in joint, ankle and foot 09/28/2014  . Abnormal nuclear stress test 12/16/2013  . Sternal pain 11/06/2013  . Postoperative anemia due to acute blood loss 02/13/2013  . Preop cardiovascular exam 12/30/2012  .  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 Breast cancer of lower-inner quadrant of right female breast ([place]) 10/21/2012  . Insomnia 10/18/2012  . UTI (urinary tract infection) 03/03/2012  . Constipation 03/03/2012  . OA (osteoarthritis) 09/07/2011  . Dermatitis   . Hyponatremia 08/28/2011  . Osteoporosis 03/07/2011  . IC (interstitial cystitis)   . Melanoma ([place])   . Craniopharyngioma ([place]) 03/25/2010  . Prediabetes 06/15/2009  . LIPOMA 02/10/2009  . Dyspnea on exertion 02/10/2009  . Hyperlipidemia, mixed 08/11/2008  . Depression with anxiety 08/11/2008  . Essential hypertension 08/11/2008    Past Surgical History:  Procedure Laterality Date  . BREAST LUMPECTOMY  04/2009   RIGHT FOR BREAST CANCER-CHEMO/RADIATION X 1 YEAR  . BREAST REDUCTION SURGERY Bilateral 05/04/2014   Procedure: MAMMARY REDUCTION  (BREAST);  Surgeon: Cristine Polio, MD;  Location: [facility];  Service: Plastics;  Laterality: Bilateral;  . CARPAL TUNNEL RELEASE     L wrist, ulnar nerve moved  . CHOLECYSTECTOMY    . CRANIOTOMY FOR TUMOR  2000  . ELBOW SURGERY     left  . KNEE ARTHROSCOPY Left 10/12/2014   Procedure: LEFT KNEE ARTHROSCOPY ;  Surgeon: Gaynelle Arabian, MD;  Location: [facility];  Service: Orthopedics;  Laterality: Left;  . KYPHOPLASTY N/A 03/30/2016   Procedure: THORACIC 12 KYPHOPLASTY;  Surgeon: Phylliss Bob, MD;  Location: [facility];  Service: Orthopedics;  Laterality: N/A;  THORACIC 12 KYPHOPLASTY  . LEFT HEART CATHETERIZATION WITH CORONARY ANGIOGRAM N/A 12/16/2013   Procedure: LEFT HEART CATHETERIZATION WITH CORONARY ANGIOGRAM;  Surgeon: Peter M [name], MD;  Location: [facility] CATH LAB;  Service: Cardiovascular;  Laterality: N/A;  . LIPOMA EXCISION  03/28/2009   right leg  . MELANOMA EXCISION    . PORT-A-CATH REMOVAL  11/30/2010   Streck  . porta cath    . PORTACATH PLACEMENT  may 2011  . REDUCTION MAMMAPLASTY Bilateral   . SYNOVECTOMY Left 10/12/2014   Procedure: WITH  SYNOVECTOMY;  Surgeon: Gaynelle Arabian, MD;  Location: [facility];  Service: Orthopedics;  Laterality: Left;  . TONSILLECTOMY  1958  . TOTAL KNEE ARTHROPLASTY  02/05/2012   Procedure: TOTAL KNEE ARTHROPLASTY;  Surgeon: Gearlean Alf, MD;  Location: [facility];  Service: Orthopedics;  Laterality: Right;  . TOTAL KNEE ARTHROPLASTY Left 02/10/2013   Procedure: LEFT TOTAL KNEE ARTHROPLASTY;  Surgeon: Gearlean Alf, MD;  Location: [facility];  Service: Orthopedics;  Laterality: Left;  . total knee raplacement  01-2012   Right Knee  . TUBAL LIGATION  1997     OB History    Gravida  3   Para  3   Term      Preterm      AB      Living  3     SAB      IAB      Ectopic      Multiple      Live Births  Family History  Problem Relation Age of Onset  . Breast cancer Mother        sarcoma  . Lung cancer Mother   . Hypertension Mother   . Prostate cancer Father   . Congestive Heart Failure Father   . Heart attack Father   . Prostate cancer Brother   . Down syndrome Son   . CVA Son   . Heart disease Maternal Grandfather        MI  . Stomach cancer Maternal Aunt   . Uterine cancer Maternal Aunt   . Colon cancer Neg Hx     Social History   Tobacco Use  . Smoking status: Never Smoker  . Smokeless tobacco: Never Used  Vaping Use  . Vaping Use: Never used  Substance Use Topics  . Alcohol use: No  . Drug use: No    Home Medications Prior to Admission medications   Medication Sig Start Date End Date Taking? Authorizing Provider  acetaminophen (TYLENOL) 500 MG tablet Take 500-1,000 mg by mouth every 6 (six) hours as needed for moderate pain or headache.     [provider]  albuterol (VENTOLIN HFA) 108 (90 Base) MCG/ACT inhaler Inhale 1-2 puffs into the lungs every 6 (six) hours as needed for wheezing or shortness of breath. 08/26/19   Mosie Lukes, MD  amoxicillin (AMOXIL) 500 MG capsule Take 1 capsule (500 mg total) by mouth 3 (three) times daily.  02/13/20   Mosie Lukes, MD  Ascorbic Acid (VITAMIN C PO) Take 1 tablet by mouth daily.    [provider]  aspirin EC 81 MG tablet Take 81 mg by mouth daily.    [provider]  B Complex Vitamins (B COMPLEX PO) Take 1 tablet by mouth daily.    [provider]  carvedilol (COREG) 25 MG tablet TAKE 1 TABLET TWICE DAILY WITH A MEAL 02/16/20   Lelon Perla, MD  clonazePAM (KLONOPIN) 0.5 MG tablet Take 0.5-1 tablets (0.25-0.5 mg total) by mouth 2 (two) times daily as needed for anxiety. 01/20/20   Mosie Lukes, MD  ferrous sulfate 325 (65 FE) MG tablet Take 325 mg by mouth daily with breakfast.    [provider]  folic acid (FOLVITE) 1 MG tablet Take 1 tablet (1 mg total) by mouth daily. 01/19/20   Mosie Lukes, MD  gabapentin (NEURONTIN) 300 MG capsule Take 1 capsule (300 mg total) by mouth 2 (two) times daily as needed. 07/28/19   Mosie Lukes, MD  hydrOXYzine (ATARAX/VISTARIL) 25 MG tablet Take 25 mg by mouth at bedtime.    [provider]  Multiple Vitamin (MULTIVITAMIN WITH MINERALS) TABS tablet Take 1 tablet by mouth daily.    [provider]  pentosan polysulfate (ELMIRON) 100 MG capsule Take 100 mg by mouth 2 (two) times daily.  02/07/12   Perkins, Alexzandrew L, PA-C  rosuvastatin (CRESTOR) 20 MG tablet TAKE 1 TABLET AT BEDTIME 12/10/19   Lelon Perla, MD  telmisartan (MICARDIS) 80 MG tablet TAKE 1 TABLET EVERY DAY 02/11/20   Lelon Perla, MD  venlafaxine XR (EFFEXOR-XR) 75 MG 24 hr capsule TAKE 2 CAPSULES EVERY DAY WITH BREAKFAST 12/03/19   Mosie Lukes, MD    Allergies    Dilaudid [hydromorphone hcl]  Review of Systems   Review of Systems  Constitutional: Negative for chills and fever.  HENT: Negative for ear pain and sore throat.   Eyes: Negative for pain and visual disturbance.  Respiratory: Negative for cough and shortness of breath.   Cardiovascular: Negative for chest pain and palpitations.   Gastrointestinal: Negative for abdominal pain and vomiting.  Genitourinary: Negative for dysuria and hematuria.  Musculoskeletal: Negative for arthralgias and back pain.  Skin: Negative for color change and rash.  Neurological: Negative for seizures and syncope.  Psychiatric/Behavioral: Positive for confusion and hallucinations. Negative for agitation and behavioral problems. The patient is not nervous/anxious.   All other systems reviewed and are negative.   Physical Exam Updated Vital Signs  ED Triage Vitals  Enc Vitals Group     BP 02/18/20 1430 138/78     Pulse Rate 02/18/20 1430 81     Resp 02/18/20 1430 16     Temp 02/18/20 1430 98.1 F (36.7 C)     Temp Source 02/18/20 1430 Oral     SpO2 02/18/20 1430 97 %     Weight --      Height 02/18/20 1428 '5\' 2"'  (1.575 m)     Head Circumference --      Peak Flow --      Pain Score 02/18/20 1428 9     Pain Loc --      Pain Edu? --      Excl. in [place]? --     Physical Exam Vitals and nursing note reviewed.  Constitutional:      General: She is not in acute distress.    Appearance: She is well-developed and well-nourished. She is not ill-appearing.  HENT:     Head: Normocephalic and atraumatic.     Nose: Nose normal.     Mouth/Throat:     Mouth: Mucous membranes are moist.  Eyes:     Extraocular Movements: Extraocular movements intact.     Conjunctiva/sclera: Conjunctivae normal.     Pupils: Pupils are equal, round, and reactive to light.  Cardiovascular:     Rate and Rhythm: Normal rate and regular rhythm.     Pulses: Normal pulses.     Heart sounds: Normal heart sounds. No murmur heard.   Pulmonary:     Effort: Pulmonary effort is normal. No respiratory distress.     Breath sounds: Normal breath sounds.  Abdominal:     Palpations: Abdomen is soft.     Tenderness: There is no abdominal tenderness.  Musculoskeletal:        General: No edema.     Cervical back: Normal range of motion and neck supple.  Skin:     General: Skin is warm and dry.  Neurological:     General: No focal deficit present.     Mental Status: She is alert.     Cranial Nerves: No cranial nerve deficit.     Sensory: No sensory deficit.     Motor: No weakness.     Coordination: Coordination normal.     Gait: Gait normal.     Comments: 5+ out of 5 strength throughout, normal sensation, no drift, normal speech, a little bit slow to answer questions but overall does not appear to be overly confused, does not appear to be having any hallucinations  Psychiatric:        Mood and Affect: Mood and affect normal.     ED Results / Procedures / Treatments   Labs (all labs ordered are listed, but only abnormal results are displayed) Labs Reviewed  COMPREHENSIVE METABOLIC PANEL - Abnormal; Notable for the following components:      Result Value   Glucose, Bld 115 (*)  AST 43 (*)    All other components within normal limits  URINALYSIS, ROUTINE W REFLEX MICROSCOPIC - Abnormal; Notable for the following components:   Leukocytes,Ua TRACE (*)    All other components within normal limits  URINALYSIS, MICROSCOPIC (REFLEX) - Abnormal; Notable for the following components:   Bacteria, UA RARE (*)    All other components within normal limits  URINE CULTURE  CBC WITH DIFFERENTIAL/PLATELET    EKG None  Radiology DG Chest 2 View  Result Date: 02/18/2020 CLINICAL DATA:  Hallucinations. EXAM: CHEST - 2 VIEW COMPARISON:  Chest x-ray 09/26/2018. FINDINGS: Mediastinum and hilar structures normal. Heart size normal. Low lung volumes with mild bibasilar atelectasis and or scarring again noted. No pleural effusion or pneumothorax. Thoracolumbar spine kyphoscoliosis and degenerative change. Prior lower thoracic vertebroplasty. Surgical clips right upper quadrant. IMPRESSION: Low lung volumes with mild bibasilar atelectasis and or scarring again noted. Electronically Signed   By: Marcello Moores  Register   On: 02/18/2020 15:59   CT Head Wo  Contrast  Result Date: 02/18/2020 CLINICAL DATA:  Mental status change. Hallucinations for 2 days. No reported injury. Reported history of craniopharyngioma, melanoma and breast cancer. EXAM: CT HEAD WITHOUT CONTRAST TECHNIQUE: Contiguous axial images were obtained from the base of the skull through the vertex without intravenous contrast. COMPARISON:  03/28/2019 head CT. FINDINGS: Brain: No evidence of parenchymal hemorrhage or extra-axial fluid collection. No mass lesion, mass effect, or midline shift. No CT evidence of acute infarction. Nonspecific mild subcortical and periventricular white matter hypodensity, most in keeping with chronic small vessel ischemic change. Mild generalized cerebral volume loss. No ventriculomegaly. Vascular: No acute abnormality. Skull: No evidence of calvarial fracture. Stable postsurgical changes from right frontal craniotomy. Sinuses/Orbits: The visualized paranasal sinuses are essentially clear. Other:  The mastoid air cells are unopacified. IMPRESSION: 1. No evidence of acute intracranial abnormality. 2. Mild generalized cerebral volume loss and mild chronic small vessel ischemic changes in the cerebral white matter. 3. Stable postsurgical changes from right frontal craniotomy. Electronically Signed   By: Ilona Sorrel M.D.   On: 02/18/2020 15:58    Procedures Procedures (including critical care time)  Medications Ordered in ED Medications  sodium chloride 0.9 % bolus 1,000 mL (has no administration in time range)  acetaminophen (TYLENOL) tablet 650 mg (has no administration in time range)    ED Course  I have reviewed the triage vital signs and the nursing notes.  Pertinent labs & imaging results that were available during my care of the patient were reviewed by me and considered in my medical decision making (see chart for details).    MDM Rules/Calculators/A&P                          Shannon [name] is a 75 year old female with history of high cholesterol,  hypertension, craniopharyngioma status postresection, insomnia who presents to the ED with altered mental status/hallucinations.  Normal vitals.  No fever.  Patient has had 2 episodes of visual hallucinations with seeing former family members that are not there.  Daughter states that one episode started last night at around 6 PM.  When she got there patient had her pills all mixed up and spilled.  Possibly took too many doses of some of her medicines with include psychiatric medications and benzodiazepines.  She had a poor night sleep and may be had some hallucinations in the morning as well today.  Her mentation overall seems to be improving per family.  She is neurologically intact on exam.  She does have some slowed mentation but overall appears well.  Urinalysis possibly with infection and will treat with Keflex.  CT scan of the head was unremarkable.  Chest x-ray showed no signs of infection.  No significant anemia, electrolyte abnormality, kidney injury.  Overall have very low suspicion for stroke.  Could be secondary to a small brain mass but this is also unlikely.  Could more likely be secondary to taking an appropriate amount of medication/poor sleep.  Less likely dementia but could be on the differential.  Shared decision was made to hold off on any MRI imaging as we believe there is a low suspicion for stroke.  If there was a small brain mass also could be worked up outpatient with an MRI.  Overall she appears to be improving since the daughter is taking over her medication.  Symptoms could be secondary to UTI as well.  Patient was given IV fluids.  She is given Tylenol for headache.  Overall patient appears well.  Shared decision was made to hold off on any more advanced imaging or admission.  They will continue to monitor at home and return if symptoms worsen and also discussed with primary care doctor about pursuing outpatient MRI to look for brain mass.  Discharged in good condition.  Understands  return precautions.  This chart was dictated using voice recognition software.  Despite best efforts to proofread,  errors can occur which can change the documentation meaning.    Final Clinical Impression(s) / ED Diagnoses Final diagnoses:  AMS (altered mental status)    Rx / DC Orders ED Discharge Orders    None       Lennice Sites, DO 02/18/20 1756

## 2020-02-20 LAB — URINE CULTURE: Culture: <10000 — AB

## 2020-02-21 ENCOUNTER — Other Ambulatory Visit: Payer: Self-pay | Admitting: Family Medicine

## 2020-02-26 DIAGNOSIS — M4310 Spondylolisthesis, site unspecified: Secondary | ICD-10-CM | POA: Diagnosis not present

## 2020-02-26 DIAGNOSIS — Z6832 Body mass index (BMI) 32.0-32.9, adult: Secondary | ICD-10-CM | POA: Diagnosis not present

## 2020-03-01 DIAGNOSIS — M4310 Spondylolisthesis, site unspecified: Secondary | ICD-10-CM | POA: Diagnosis not present

## 2020-03-02 ENCOUNTER — Other Ambulatory Visit: Payer: Self-pay | Admitting: Medical

## 2020-03-02 ENCOUNTER — Telehealth: Payer: Self-pay | Admitting: Medical

## 2020-03-02 ENCOUNTER — Other Ambulatory Visit: Payer: Self-pay

## 2020-03-02 ENCOUNTER — Encounter: Payer: Self-pay | Admitting: Medical

## 2020-03-02 ENCOUNTER — Ambulatory Visit (HOSPITAL_COMMUNITY)
Admission: RE | Admit: 2020-03-02 | Discharge: 2020-03-02 | Disposition: A | Discharge status: Home / Self Care | Payer: Medicare Other | Source: Ambulatory Visit | Attending: Medical | Admitting: Medical

## 2020-03-02 ENCOUNTER — Ambulatory Visit (INDEPENDENT_AMBULATORY_CARE_PROVIDER_SITE_OTHER): Payer: Medicare Other | Admitting: Medical

## 2020-03-02 VITALS — BP 145/85 | HR 68 | Resp 20 | Ht 62.0 in | Wt 177.0 lb

## 2020-03-02 DIAGNOSIS — I10 Essential (primary) hypertension: Secondary | ICD-10-CM | POA: Insufficient documentation

## 2020-03-02 DIAGNOSIS — Z87898 Personal history of other specified conditions: Secondary | ICD-10-CM

## 2020-03-02 DIAGNOSIS — Z9889 Other specified postprocedural states: Secondary | ICD-10-CM | POA: Diagnosis not present

## 2020-03-02 DIAGNOSIS — R4182 Altered mental status, unspecified: Secondary | ICD-10-CM

## 2020-03-02 DIAGNOSIS — E782 Mixed hyperlipidemia: Secondary | ICD-10-CM | POA: Diagnosis not present

## 2020-03-02 DIAGNOSIS — Z853 Personal history of malignant neoplasm of breast: Secondary | ICD-10-CM

## 2020-03-02 DIAGNOSIS — N63 Unspecified lump in unspecified breast: Secondary | ICD-10-CM

## 2020-03-02 DIAGNOSIS — R519 Headache, unspecified: Secondary | ICD-10-CM | POA: Diagnosis not present

## 2020-03-02 DIAGNOSIS — G319 Degenerative disease of nervous system, unspecified: Secondary | ICD-10-CM | POA: Diagnosis not present

## 2020-03-02 MED ORDER — NYSTATIN 100000 UNIT/GM EX CREA: 1.0000 "application " | TOPICAL_CREAM | Freq: Two times a day (BID) | CUTANEOUS | 0 refills | Status: DC

## 2020-03-02 NOTE — Telephone Encounter (Signed)
Referral to neurologist placed. 

## 2020-03-02 NOTE — Patient Instructions (Addendum)
For recent on and off ha for one month with AMS, hallucination and hx of brain tumor placed stat order to get mri without contrast. Asking referral staff to get prior authorization.  Will get cmp today.  Htn history. Relatively controlled today. Continue carvediolol and  Telmisartan.(no change to bp meds.)  For high cholesterol continue crestor.  Rash and break down under breast. Stop steroid cream and rx nystatin for possible fungal infection.  Possible breast mass lump rt breast under nipple above break down area. Get diagnostic mammogram. Order placed.  Follow mri of head. Will likely go ahead and refer to neurologist.   Follow up 7-10 days or as needed

## 2020-03-02 NOTE — Progress Notes (Signed)
Subjective:    Patient ID: Shannon [name], female    DOB: [birth year], 75 y.o.   MRN: [id number]  HPI  Pt in reporting some headaches.   Pt states these ha occurring for about one month.   Pt had CT of head done about 2 weeks ago.   This was done due to ED visit.     Altered Mental Status    Shannon [name] is a 76 y.o. female.  The history is provided by the patient.  Altered Mental Status Presenting symptoms: confusion   Severity:  Mild Most recent episode:  Yesterday Episode number: two episodes of visual hallucinations. No hx of same. Accidentally took medications wrong yesterday and posisbly led to confusion. Poor sleep last night. Timing:  Intermittent Progression:  Improving Chronicity:  New Context: not dementia and taking medications as prescribed   Associated symptoms: hallucinations   Associated symptoms: no abdominal pain, no agitation, no fever, no palpitations, no rash, no seizures and no vomiting    A/P from ED.  MDM Rules/Calculators/A&P                          Shannon [name] is a 75 year old female with history of high cholesterol, hypertension, craniopharyngioma status postresection, insomnia who presents to the ED with altered mental status/hallucinations.  Normal vitals.  No fever.  Patient has had 2 episodes of visual hallucinations with seeing former family members that are not there.  Daughter states that one episode started last night at around 6 PM.  When she got there patient had her pills all mixed up and spilled.  Possibly took too many doses of some of her medicines with include psychiatric medications and benzodiazepines.  She had a poor night sleep and may be had some hallucinations in the morning as well today.  Her mentation overall seems to be improving per family.  She is neurologically intact on exam.  She does have some slowed mentation but overall appears well.  Urinalysis possibly with infection and will treat with Keflex.  CT scan of the  head was unremarkable.  Chest x-ray showed no signs of infection.  No significant anemia, electrolyte abnormality, kidney injury.  Overall have very low suspicion for stroke.  Could be secondary to a small brain mass but this is also unlikely.  Could more likely be secondary to taking an appropriate amount of medication/poor sleep.  Less likely dementia but could be on the differential.  Shared decision was made to hold off on any MRI imaging as we believe there is a low suspicion for stroke.  If there was a small brain mass also could be worked up outpatient with an MRI.  Overall she appears to be improving since the daughter is taking over her medication.  Symptoms could be secondary to UTI as well.  Patient was given IV fluids.  She is given Tylenol for headache.  Overall patient appears well.  Shared decision was made to hold off on any more advanced imaging or admission.  They will continue to monitor at home and return if symptoms worsen and also discussed with primary care doctor about pursuing outpatient MRI to look for brain mass.  Discharged in good condition.  Understands return precautions   Pt states her headaches occur about every day. Can occur for an hour and sometimes all day. She feels like she zones out during ha. No hx of seizures. No vomiting. When gets ha she starts to say "crazy  things briefly". No vision changes with ha. No nausea or vomiting with ha. Not dizziness. Hx of brain tumor in past. Pt states craniopharyngioma in past. Removed 10-15 years ago.   Last time had headache was yesterday.    ED offered to send to [facility] for mri but that was declined by family.  Also rt area under breast rash with some broken skin. Pt derm sent in steroid cream. Area did not get better.     Review of Systems  Constitutional: Negative for chills, fatigue and fever.  Respiratory: Negative for choking, shortness of breath and wheezing.   Cardiovascular: Negative for chest pain and  palpitations.  Gastrointestinal: Negative for abdominal pain.  Musculoskeletal: Negative for back pain.  Skin:       Breakdown under rt breast. See hpi and exam.  Neurological: Positive for headaches. Negative for dizziness, tremors, seizures, syncope, speech difficulty and weakness.       See hpi.  Hematological: Negative for adenopathy. Does not bruise/bleed easily.  Psychiatric/Behavioral: Positive for confusion and hallucinations. Negative for behavioral problems and dysphoric mood.       When gets headaches.    Past Medical History:  Diagnosis Date  . 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 Anemia 08/28/2011  . Asthma    as a child  . Brain tumor ([place])   . Breast cancer ([place])    right breast  . Carpal tunnel syndrome of left wrist   . Cataracts, bilateral 12/28/2015  . Constipation 03/03/2012  . DEPRESSION 08/11/2008  . Dermatitis   . Facial fracture ([place])   . GERD 08/11/2008  . H/O measles   . H/O mumps   . History of blood transfusion   . History of chicken pox   . History of hiatal hernia   . 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 HYPERLIPIDEMIA 08/11/2008  . HYPERTENSION 08/11/2008  . Hyponatremia 08/28/2011  . 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 Interstitial cystitis   . LIPOMA 02/10/2009  . Medicare annual wellness visit, subsequent 12/27/2014   Follows with Dr Amalia Hailey of urology Follows with Dr Posey Pronto at [facility] for opthamology Follows with Dr [name] of dermatology Follows with [facility] gastroenterology, last scope done in 2011 repeat in 10 years Last [facility] July 2015 should repeat every 1-2 years Last Pap December 2015, repeat in 3 years.   . Melanoma ([place]) 2008   DR. Ronnald Ramp  . Neuropathy   . NICM (nonischemic cardiomyopathy) ([place]) 03/08/2010   likely 2/2 chemotx - a. Echo 2012: EF 45-50%;  b. Lex MV 2/12:  low risk, apical defect (small area of ischemia vs shifting breast atten);  c.  Echo 7/12:  Normal wall thickness, EF 60-65%, normal wall motion, grade 1 diastolic dysfunction, mild LAE, PASP 32;   d. Lex MV 11/13:  EF 76%, no ischemia  . OA (osteoarthritis) 09/07/2011  . Obesity 09/28/2014  . Osteoporosis   . Personal history of chemotherapy 2012  . Personal history of radiation therapy 2012  . PREDIABETES 06/15/2009  . Recurrent falls 12/28/2015  . Shortness of breath   . UTI (lower urinary tract infection) 03/03/2012  . Vertebral fracture, osteoporotic, sequela 04/05/2016     Social History   Socioeconomic History  . Marital status: Married    Spouse name: Shannon [name]  . Number of children: 3  . Years of education: Not on file  .  Highest education level: Not on file  Occupational History  . Occupation: boutique owner-retired    Employer: [employer]  Tobacco Use  . Smoking status: Never Smoker  . Smokeless tobacco: Never Used  Vaping Use  . Vaping Use: Never used  Substance and Sexual Activity  . Alcohol use: No  . Drug use: No  . Sexual activity: Not Currently  Other Topics Concern  . Not on file  Social History Narrative   Patient is right-handed. She lives with her husband in a 4 story home. They take care of their grown son with down syndrome who has had a stroke. She drinks 1-2 glasses of tea in the evenings. She does not formally exercise.   Social Determinants of Health   Financial Resource Strain: Not on file  Food Insecurity: Not on file  Transportation Needs: Not on file  Physical Activity: Not on file  Stress: Not on file  Social Connections: Not on file  Intimate Partner Violence: Not on file    Past Surgical History:  Procedure Laterality Date  . BREAST LUMPECTOMY  04/2009   RIGHT FOR BREAST CANCER-CHEMO/RADIATION X 1 YEAR  . BREAST REDUCTION SURGERY Bilateral 05/04/2014   Procedure: MAMMARY REDUCTION  (BREAST);  Surgeon: Cristine Polio, MD;  Location: [facility];  Service: Plastics;  Laterality: Bilateral;  . CARPAL TUNNEL RELEASE      L wrist, ulnar nerve moved  . CHOLECYSTECTOMY    . CRANIOTOMY FOR TUMOR  2000  . ELBOW SURGERY     left  . KNEE ARTHROSCOPY Left 10/12/2014   Procedure: LEFT KNEE ARTHROSCOPY ;  Surgeon: Gaynelle Arabian, MD;  Location: [facility];  Service: Orthopedics;  Laterality: Left;  . KYPHOPLASTY N/A 03/30/2016   Procedure: THORACIC 12 KYPHOPLASTY;  Surgeon: Phylliss Bob, MD;  Location: [facility];  Service: Orthopedics;  Laterality: N/A;  THORACIC 12 KYPHOPLASTY  . LEFT HEART CATHETERIZATION WITH CORONARY ANGIOGRAM N/A 12/16/2013   Procedure: LEFT HEART CATHETERIZATION WITH CORONARY ANGIOGRAM;  Surgeon: Peter M [name], MD;  Location: [facility] C[facility];  Service: Cardiovascular;  Laterality: N/A;  . LIPOMA EXCISION  03/28/2009   right leg  . MELANOMA EXCISION    . PORT-A-CATH REMOVAL  11/30/2010   [place]reck  . porta cath    . PORTACATH PLACEMENT  may 2011  . REDUCTION MAMMAPLASTY Bilateral   . SYNOVECTOMY Left 10/12/2014   Procedure: WITH SYNOVECTOMY;  Surgeon: Gaynelle Arabian, MD;  Location: [facility];  Service: Orthopedics;  Laterality: Left;  . TONSILLECTOMY  1958  . TOTAL KNEE ARTHROPLASTY  02/05/2012   Procedure: TOTAL KNEE ARTHROPLASTY;  Surgeon: Gearlean Alf, MD;  Location: [facility];  Service: Orthopedics;  Laterality: Right;  . TOTAL KNEE ARTHROPLASTY Left 02/10/2013   Procedure: LEFT TOTAL KNEE ARTHROPLASTY;  Surgeon: Gearlean Alf, MD;  Location: [facility];  Service: Orthopedics;  Laterality: Left;  . total knee raplacement  01-2012   Right Knee  . TUBAL LIGATION  1997    Family History  Problem Relation Age of Onset  . Breast cancer Mother        sarcoma  . Lung cancer Mother   . Hypertension Mother   . Prostate cancer Father   . Congestive Heart Failure Father   . Heart attack Father   . Prostate cancer Brother   . Down syndrome Son   . CVA Son   . Heart disease Maternal Grandfather        MI  . Stomach cancer Maternal Aunt   .  Uterine cancer Maternal Aunt   . Colon cancer Neg Hx      Allergies  Allergen Reactions  . Dilaudid [Hydromorphone Hcl] Itching    Current Outpatient Medications on File Prior to Visit  Medication Sig Dispense Refill  . acetaminophen (TYLENOL) 500 MG tablet Take 500-1,000 mg by mouth every 6 (six) hours as needed for moderate pain or headache.     . albuterol (VENTOLIN HFA) 108 (90 Base) MCG/ACT inhaler Inhale 1-2 puffs into the lungs every 6 (six) hours as needed for wheezing or shortness of breath. 18 g 3  . amoxicillin (AMOXIL) 500 MG capsule Take 1 capsule (500 mg total) by mouth 3 (three) times daily. 30 capsule 0  . Ascorbic Acid (VITAMIN C PO) Take 1 tablet by mouth daily.    Marland Kitchen aspirin EC 81 MG tablet Take 81 mg by mouth daily.    . B Complex Vitamins (B COMPLEX PO) Take 1 tablet by mouth daily.    . carvedilol (COREG) 25 MG tablet TAKE 1 TABLET TWICE DAILY WITH A MEAL 180 tablet 1  . clonazePAM (KLONOPIN) 0.5 MG tablet Take 0.5-1 tablets (0.25-0.5 mg total) by mouth 2 (two) times daily as needed for anxiety. 60 tablet 0  . ferrous sulfate 325 (65 FE) MG tablet Take 325 mg by mouth daily with breakfast.    . folic acid (FOLVITE) 1 MG tablet Take 1 tablet (1 mg total) by mouth daily. 90 tablet 0  . gabapentin (NEURONTIN) 300 MG capsule Take 1 capsule (300 mg total) by mouth 2 (two) times daily as needed. 180 capsule 0  . hydrOXYzine (ATARAX/VISTARIL) 25 MG tablet Take 25 mg by mouth at bedtime.    . Multiple Vitamin (MULTIVITAMIN WITH MINERALS) TABS tablet Take 1 tablet by mouth daily.    . pentosan polysulfate (ELMIRON) 100 MG capsule Take 100 mg by mouth 2 (two) times daily.     . rosuvastatin (CRESTOR) 20 MG tablet TAKE 1 TABLET AT BEDTIME 90 tablet 3  . telmisartan (MICARDIS) 80 MG tablet TAKE 1 TABLET EVERY DAY 90 tablet 0  . venlafaxine XR (EFFEXOR-XR) 75 MG 24 hr capsule Take 2 capsules (150 mg total) by mouth daily with breakfast. 180 capsule 1   No current facility-administered medications on file prior to visit.    BP (!)  145/85   Pulse 68   Resp 20   Ht _0  (1.575 m)   Wt 177 lb (80.3 kg)   LMP 01/30/1994   SpO2 95%   BMI 32.37 kg/m       Objective:   Physical Exam  General Mental Status- Alert. General Appearance- Not in acute distress.   Skin General: Color- Normal Color. Moisture- Normal Moisture.  Neck Carotid Arteries- Normal color. Moisture- Normal Moisture. No carotid bruits. No JVD.  Chest and Lung Exam Auscultation: Breath Sounds:-Normal.  Cardiovascular Auscultation:Rythm- Regular. Murmurs & Other Heart Sounds:Auscultation of the heart reveals- No Murmurs.  Abdomen Inspection:-Inspeection Normal. Palpation/Percussion:Note:No mass. Palpation and Percussion of the abdomen reveal- Non Tender, Non Distended + BS, no rebound or guarding.    Neurologic Cranial Nerve exam:- CN III-XII intact(No nystagmus), symmetric smile. Drift Test:- No drift. Romberg Exam:- Negative.  Heal to Toe Gait exam:-Normal. Finger to Nose:- Normal/Intact Strength:- 5/5 equal and symmetric strength both upper and lower extremities.      Assessment & Plan:  [phone]. Shannon [name].  For recent on and off ha for one month with AMS, hallucination and hx of brain tumor placed stat order to  get mri without contrast. Asking referral staff to get prior authorization.  Will get cmp today.  Htn history. Relatively controlled today. Continue carvediolol and  Telmisartan.(no change to bp meds.)  For high cholesterol continue crestor.  Rash and break down under breast. Stop steroid cream and rx nystatin for possible fungal infection.  Possible breast mass lump rt breast under nipple above break down area. Get diagnostic mammogram. Order placed.  Follow mri of head. Will likely go ahead and refer to neurologist.   Follow up 7-10 days or as needed  Time spent with patient today was 47  minutes which consisted of chart revdiew, discussing diagnosis, work up treatment and documentation.

## 2020-03-05 ENCOUNTER — Other Ambulatory Visit: Payer: Self-pay | Admitting: Family Medicine

## 2020-03-05 ENCOUNTER — Ambulatory Visit: Payer: Medicare Other

## 2020-03-05 ENCOUNTER — Other Ambulatory Visit: Payer: Self-pay

## 2020-03-05 ENCOUNTER — Ambulatory Visit
Admission: RE | Admit: 2020-03-05 | Discharge: 2020-03-05 | Disposition: A | Discharge status: Home / Self Care | Payer: Medicare Other | Source: Ambulatory Visit | Attending: Medical | Admitting: Medical

## 2020-03-05 ENCOUNTER — Other Ambulatory Visit (INDEPENDENT_AMBULATORY_CARE_PROVIDER_SITE_OTHER): Payer: Medicare Other

## 2020-03-05 DIAGNOSIS — N63 Unspecified lump in unspecified breast: Secondary | ICD-10-CM

## 2020-03-05 DIAGNOSIS — R928 Other abnormal and inconclusive findings on diagnostic imaging of breast: Secondary | ICD-10-CM | POA: Diagnosis not present

## 2020-03-05 DIAGNOSIS — K921 Melena: Secondary | ICD-10-CM | POA: Diagnosis not present

## 2020-03-05 DIAGNOSIS — K625 Hemorrhage of anus and rectum: Secondary | ICD-10-CM

## 2020-03-05 DIAGNOSIS — M4310 Spondylolisthesis, site unspecified: Secondary | ICD-10-CM | POA: Diagnosis not present

## 2020-03-05 DIAGNOSIS — Z853 Personal history of malignant neoplasm of breast: Secondary | ICD-10-CM

## 2020-03-05 LAB — FECAL OCCULT BLOOD, IMMUNOCHEMICAL: Fecal Occult Bld: POSITIVE — AB

## 2020-03-10 DIAGNOSIS — M4310 Spondylolisthesis, site unspecified: Secondary | ICD-10-CM | POA: Diagnosis not present

## 2020-03-11 ENCOUNTER — Ambulatory Visit (HOSPITAL_BASED_OUTPATIENT_CLINIC_OR_DEPARTMENT_OTHER)
Admission: RE | Admit: 2020-03-11 | Discharge: 2020-03-11 | Disposition: A | Discharge status: Home / Self Care | Payer: Medicare Other | Source: Ambulatory Visit | Attending: Medical | Admitting: Medical

## 2020-03-11 ENCOUNTER — Encounter: Payer: Self-pay | Admitting: Medical

## 2020-03-11 ENCOUNTER — Ambulatory Visit: Payer: Medicare Other | Admitting: Medical

## 2020-03-11 ENCOUNTER — Other Ambulatory Visit: Payer: Self-pay

## 2020-03-11 ENCOUNTER — Ambulatory Visit (INDEPENDENT_AMBULATORY_CARE_PROVIDER_SITE_OTHER): Payer: Medicare Other | Admitting: Medical

## 2020-03-11 VITALS — BP 125/70 | HR 77 | Resp 18 | Ht 62.0 in | Wt 165.0 lb

## 2020-03-11 DIAGNOSIS — I10 Essential (primary) hypertension: Secondary | ICD-10-CM | POA: Diagnosis not present

## 2020-03-11 DIAGNOSIS — K921 Melena: Secondary | ICD-10-CM

## 2020-03-11 DIAGNOSIS — G4489 Other headache syndrome: Secondary | ICD-10-CM | POA: Diagnosis not present

## 2020-03-11 DIAGNOSIS — T148XXD Other injury of unspecified body region, subsequent encounter: Secondary | ICD-10-CM | POA: Diagnosis not present

## 2020-03-11 DIAGNOSIS — R4182 Altered mental status, unspecified: Secondary | ICD-10-CM | POA: Diagnosis not present

## 2020-03-11 DIAGNOSIS — R059 Cough, unspecified: Secondary | ICD-10-CM | POA: Insufficient documentation

## 2020-03-11 NOTE — Patient Instructions (Addendum)
History of hypertension.  Blood pressure well controlled today.  Continue telmisartan 80 mg daily and Coreg 25 mg twice daily.  History of bright red blood in stool.  Upcoming GI appointment.  During the interim if bright red blood worsens or changes let us know.  Delayed healing in the area below right breast.  But on evaluation today it does look like it is closing slowly.  Decided to go ahead and refer her to wound care.  If the area completely closes prior to wound care appointment date then recommend canceling.  Also have dermatologist check the area early next month.  Glad to hear that your headaches are less intense and no further confusion/altered mental status episodes.  Keep appointment with neurologist.  Intermittent mild productive cough over the last 2 weeks.  Lungs sound clear.  We will go ahead and get chest x-ray today to see if any pneumonia seen.  If no pneumonia is seen but your signs symptoms worsen or change let us know.  In that event could give antibiotic but presently will hold off and follow x-ray.  Follow-up as regular scheduled with PCP or sooner if needed.

## 2020-03-11 NOTE — Progress Notes (Signed)
Subjective:    Patient ID: Shannon [name], female    DOB: [birth year], 75 y.o.   MRN: [id number]  HPI  Pt in for follow up.  She has appointment with neurologist on April 01, 2020. This is for some episodes of ams and headaches. Pt mri recently was negative for mass or acute abnormality.. Occasional mild ha, no recurrent altered mental status/confusion and not gross/motor function deficits.  Pt has some bright red blood in stools. She has appointment with GI on Mar 25, 2020. Still has some intermittent bright red blood after bm when wiping. 3 weeks ago labs showed no anemia.  Pt has area under rt breast that was broken down. Mammogram done recently showed no breast cancer. The breakdown area persists. Pt is going to see dermatologist on 3rd of March. The area persists.   Hx of htn. Bp well controlled.  Over past 2 weeks occasional random cough bringing up mucus.     Review of Systems  Constitutional: Negative for chills, fatigue and fever.  HENT: Negative for congestion and drooling.   Respiratory: Negative for cough, chest tightness, shortness of breath and wheezing.   Cardiovascular: Negative for chest pain and palpitations.  Gastrointestinal: Negative for abdominal pain and blood in stool.  Genitourinary: Negative for dysuria, flank pain and frequency.  Musculoskeletal: Negative for back pain, gait problem, joint swelling and neck stiffness.  Skin: Negative for rash.  Neurological: Negative for dizziness, syncope, speech difficulty, weakness, numbness and headaches.  Hematological: Negative for adenopathy. Does not bruise/bleed easily.  Psychiatric/Behavioral: Negative for behavioral problems, confusion and dysphoric mood. The patient is not nervous/anxious.    Past Medical History:  Diagnosis Date  . 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 Anemia 08/28/2011  . Asthma    as a child  . Brain tumor ([place])   . Breast cancer ([place])    right breast  . Carpal tunnel  syndrome of left wrist   . Cataracts, bilateral 12/28/2015  . Constipation 03/03/2012  . DEPRESSION 08/11/2008  . Dermatitis   . Facial fracture ([place])   . GERD 08/11/2008  . H/O measles   . H/O mumps   . History of blood transfusion   . History of chicken pox   . History of hiatal hernia   . 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 HYPERLIPIDEMIA 08/11/2008  . HYPERTENSION 08/11/2008  . Hyponatremia 08/28/2011  . 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 Interstitial cystitis   . LIPOMA 02/10/2009  . Medicare annual wellness visit, subsequent 12/27/2014   Follows with Dr Amalia Hailey of urology Follows with Dr Posey Pronto at [facility] for opthamology Follows with Dr [name] of dermatology Follows with [facility] gastroenterology, last scope done in 2011 repeat in 10 years Last [facility] July 2015 should repeat every 1-2 years Last Pap December 2015, repeat in 3 years.   . Melanoma ([place]) 2008   DR. Ronnald Ramp  . Neuropathy   . NICM (nonischemic cardiomyopathy) ([place]) 03/08/2010   likely 2/2 chemotx - a. Echo 2012: EF 45-50%;  b. Lex MV 2/12:  low risk, apical defect (small area of ischemia vs shifting breast atten);  c.  Echo 7/12: Normal wall thickness, EF 60-65%, normal wall motion, grade 1 diastolic dysfunction, mild LAE, PASP 32;   d. Lex MV 11/13:  EF 76%, no ischemia  . OA (osteoarthritis) 09/07/2011  . Obesity 09/28/2014  .  Osteoporosis   . Personal history of chemotherapy 2012  . Personal history of radiation therapy 2012  . PREDIABETES 06/15/2009  . Recurrent falls 12/28/2015  . Shortness of breath   . UTI (lower urinary tract infection) 03/03/2012  . Vertebral fracture, osteoporotic, sequela 04/05/2016     Social History   Socioeconomic History  . Marital status: Married    Spouse name: Deidre Ala  . Number of children: 3  . Years of education: Not on file  . Highest education level: Not on file  Occupational History  . Occupation: boutique  owner-retired    Employer: [employer]  Tobacco Use  . Smoking status: Never Smoker  . Smokeless tobacco: Never Used  Vaping Use  . Vaping Use: Never used  Substance and Sexual Activity  . Alcohol use: No  . Drug use: No  . Sexual activity: Not Currently  Other Topics Concern  . Not on file  Social History Narrative   Patient is right-handed. She lives with her husband in a 4 story home. They take care of their grown son with down syndrome who has had a stroke. She drinks 1-2 glasses of tea in the evenings. She does not formally exercise.   Social Determinants of Health   Financial Resource Strain: Not on file  Food Insecurity: Not on file  Transportation Needs: Not on file  Physical Activity: Not on file  Stress: Not on file  Social Connections: Not on file  Intimate Partner Violence: Not on file    Past Surgical History:  Procedure Laterality Date  . BREAST LUMPECTOMY  04/2009   RIGHT FOR BREAST CANCER-CHEMO/RADIATION X 1 YEAR  . BREAST REDUCTION SURGERY Bilateral 05/04/2014   Procedure: MAMMARY REDUCTION  (BREAST);  Surgeon: Cristine Polio, MD;  Location: [facility];  Service: Plastics;  Laterality: Bilateral;  . CARPAL TUNNEL RELEASE     L wrist, ulnar nerve moved  . CHOLECYSTECTOMY    . CRANIOTOMY FOR TUMOR  2000  . ELBOW SURGERY     left  . KNEE ARTHROSCOPY Left 10/12/2014   Procedure: LEFT KNEE ARTHROSCOPY ;  Surgeon: Gaynelle Arabian, MD;  Location: [facility];  Service: Orthopedics;  Laterality: Left;  . KYPHOPLASTY N/A 03/30/2016   Procedure: THORACIC 12 KYPHOPLASTY;  Surgeon: Phylliss Bob, MD;  Location: [facility];  Service: Orthopedics;  Laterality: N/A;  THORACIC 12 KYPHOPLASTY  . LEFT HEART CATHETERIZATION WITH CORONARY ANGIOGRAM N/A 12/16/2013   Procedure: LEFT HEART CATHETERIZATION WITH CORONARY ANGIOGRAM;  Surgeon: Peter M [name], MD;  Location: [facility];  Service: Cardiovascular;  Laterality: N/A;  . LIPOMA EXCISION  03/28/2009   right leg  .  MELANOMA EXCISION    . PORT-A-CATH REMOVAL  11/30/2010   [place]  . porta cath    . PORTACATH PLACEMENT  may 2011  . REDUCTION MAMMAPLASTY Bilateral   . SYNOVECTOMY Left 10/12/2014   Procedure: WITH SYNOVECTOMY;  Surgeon: Gaynelle Arabian, MD;  Location: [facility];  Service: Orthopedics;  Laterality: Left;  . TONSILLECTOMY  1958  . TOTAL KNEE ARTHROPLASTY  02/05/2012   Procedure: TOTAL KNEE ARTHROPLASTY;  Surgeon: Gearlean Alf, MD;  Location: [facility];  Service: Orthopedics;  Laterality: Right;  . TOTAL KNEE ARTHROPLASTY Left 02/10/2013   Procedure: LEFT TOTAL KNEE ARTHROPLASTY;  Surgeon: Gearlean Alf, MD;  Location: [facility];  Service: Orthopedics;  Laterality: Left;  . total knee raplacement  01-2012   Right Knee  . TUBAL LIGATION  1997    Family History  Problem  Relation Age of Onset  . Breast cancer Mother        sarcoma  . Lung cancer Mother   . Hypertension Mother   . Prostate cancer Father   . Congestive Heart Failure Father   . Heart attack Father   . Prostate cancer Brother   . Down syndrome Son   . CVA Son   . Heart disease Maternal Grandfather        MI  . Stomach cancer Maternal Aunt   . Uterine cancer Maternal Aunt   . Colon cancer Neg Hx     Allergies  Allergen Reactions  . Dilaudid [Hydromorphone Hcl] Itching    Current Outpatient Medications on File Prior to Visit  Medication Sig Dispense Refill  . acetaminophen (TYLENOL) 500 MG tablet Take 500-1,000 mg by mouth every 6 (six) hours as needed for moderate pain or headache.     . albuterol (VENTOLIN HFA) 108 (90 Base) MCG/ACT inhaler Inhale 1-2 puffs into the lungs every 6 (six) hours as needed for wheezing or shortness of breath. 18 g 3  . amoxicillin (AMOXIL) 500 MG capsule Take 1 capsule (500 mg total) by mouth 3 (three) times daily. 30 capsule 0  . Ascorbic Acid (VITAMIN C PO) Take 1 tablet by mouth daily.    Marland Kitchen aspirin EC 81 MG tablet Take 81 mg by mouth daily.    . B Complex Vitamins (B COMPLEX PO) Take 1  tablet by mouth daily.    . carvedilol (COREG) 25 MG tablet TAKE 1 TABLET TWICE DAILY WITH A MEAL 180 tablet 1  . clonazePAM (KLONOPIN) 0.5 MG tablet Take 0.5-1 tablets (0.25-0.5 mg total) by mouth 2 (two) times daily as needed for anxiety. 60 tablet 0  . ferrous sulfate 325 (65 FE) MG tablet Take 325 mg by mouth daily with breakfast.    . folic acid (FOLVITE) 1 MG tablet Take 1 tablet (1 mg total) by mouth daily. 90 tablet 0  . gabapentin (NEURONTIN) 300 MG capsule Take 1 capsule (300 mg total) by mouth 2 (two) times daily as needed. 180 capsule 0  . hydrOXYzine (ATARAX/VISTARIL) 25 MG tablet Take 25 mg by mouth at bedtime.    . Multiple Vitamin (MULTIVITAMIN WITH MINERALS) TABS tablet Take 1 tablet by mouth daily.    Marland Kitchen nystatin cream (MYCOSTATIN) Apply 1 application topically 2 (two) times daily. 30 g 0  . pentosan polysulfate (ELMIRON) 100 MG capsule Take 100 mg by mouth 2 (two) times daily.     . rosuvastatin (CRESTOR) 20 MG tablet TAKE 1 TABLET AT BEDTIME 90 tablet 3  . telmisartan (MICARDIS) 80 MG tablet TAKE 1 TABLET EVERY DAY 90 tablet 0  . venlafaxine XR (EFFEXOR-XR) 75 MG 24 hr capsule Take 2 capsules (150 mg total) by mouth daily with breakfast. 180 capsule 1   No current facility-administered medications on file prior to visit.    BP (!) 110/55   Pulse 77   Resp 18   Ht '5\' 2"'  (1.575 m)   Wt 165 lb (74.8 kg)   LMP 01/30/1994   SpO2 99%   BMI 30.18 kg/m       Objective:   Physical Exam  General Mental Status- Alert. General Appearance- Not in acute distress.   Skin General: Color- Normal Color. Moisture- Normal Moisture.  Neck Carotid Arteries- Normal color. Moisture- Normal Moisture. No carotid bruits. No JVD.  Chest and Lung Exam Auscultation: Breath Sounds:-Normal.  Cardiovascular Auscultation:Rythm- Regular. Murmurs & Other Heart Sounds:Auscultation of  the heart reveals- No Murmurs.  Abdomen Inspection:-Inspeection Normal. Palpation/Percussion:Note:No  mass. Palpation and Percussion of the abdomen reveal- Non Tender, Non Distended + BS, no rebound or guarding.   Neurologic Cranial Nerve exam:- CN III-XII intact(No nystagmus), symmetric smile. Strength:- 5/5 equal and symmetric strength both upper and lower extremities.  Derm-area underneath right breast still has faint pinkish appearance in that region but the wounds actually look like they have fill them and closed though not completely.  No discharge.     Assessment & Plan:  History of hypertension.  Blood pressure well controlled today.  Continue telmisartan 80 mg daily and Coreg 25 mg twice daily.  History of bright red blood in stool.  Upcoming GI appointment.  During the interim if bright red blood worsens or changes let us know.  Delayed healing in the area below right breast.  But on evaluation today it does look like it is closing slowly.  Decided to go ahead and refer her to wound care.  If the area completely closes prior to wound care appointment date then recommend canceling.  Also have dermatologist check the area early next month.  Glad to hear that your headaches are less intense and no further confusion/altered mental status episodes.  Keep appointment with neurologist.  Intermittent mild productive cough over the last 2 weeks.  Lungs sound clear.  We will go ahead and get chest x-ray today to see if any pneumonia seen.  If no pneumonia is seen but your signs symptoms worsen or change let us know.  In that event could give antibiotic but presently will hold off and follow x-ray.  Follow-up as regular scheduled with PCP or sooner if needed.

## 2020-03-12 DIAGNOSIS — M4310 Spondylolisthesis, site unspecified: Secondary | ICD-10-CM | POA: Diagnosis not present

## 2020-03-15 ENCOUNTER — Telehealth: Payer: Self-pay | Admitting: *Deleted

## 2020-03-15 ENCOUNTER — Other Ambulatory Visit: Payer: Self-pay | Admitting: Family Medicine

## 2020-03-15 MED ORDER — CLONAZEPAM 0.5 MG PO TABS: 0.2500 mg | ORAL_TABLET | Freq: Two times a day (BID) | ORAL | 1 refills | Status: DC | PRN

## 2020-03-15 NOTE — Telephone Encounter (Signed)
done

## 2020-03-15 NOTE — Telephone Encounter (Signed)
[facility] requesting refill for:  Requesting: clonazepam Contract: 08/26/19 UDS: 08/26/19 Last Visit: 03/11/20 Next Visit: none Last Refill: 01/20/20  Please Advise

## 2020-03-16 ENCOUNTER — Other Ambulatory Visit: Payer: Self-pay

## 2020-03-16 ENCOUNTER — Telehealth: Payer: Self-pay

## 2020-03-16 ENCOUNTER — Other Ambulatory Visit: Payer: Medicare Other

## 2020-03-16 ENCOUNTER — Other Ambulatory Visit: Payer: Self-pay | Admitting: Family Medicine

## 2020-03-16 DIAGNOSIS — R300 Dysuria: Secondary | ICD-10-CM

## 2020-03-16 NOTE — Telephone Encounter (Signed)
Shannon [name] called and would like to know if she can have her urine checked for lower abdominal pain and frequent urination. She left a specimen when she dropped off her ifob at the lab.

## 2020-03-16 NOTE — Telephone Encounter (Signed)
I placed the order.

## 2020-03-17 ENCOUNTER — Other Ambulatory Visit (INDEPENDENT_AMBULATORY_CARE_PROVIDER_SITE_OTHER): Payer: Medicare Other

## 2020-03-17 DIAGNOSIS — R300 Dysuria: Secondary | ICD-10-CM

## 2020-03-17 DIAGNOSIS — M4310 Spondylolisthesis, site unspecified: Secondary | ICD-10-CM | POA: Diagnosis not present

## 2020-03-18 ENCOUNTER — Other Ambulatory Visit (INDEPENDENT_AMBULATORY_CARE_PROVIDER_SITE_OTHER): Payer: Medicare Other

## 2020-03-18 ENCOUNTER — Telehealth: Payer: Self-pay

## 2020-03-18 DIAGNOSIS — K625 Hemorrhage of anus and rectum: Secondary | ICD-10-CM

## 2020-03-18 DIAGNOSIS — M4310 Spondylolisthesis, site unspecified: Secondary | ICD-10-CM | POA: Diagnosis not present

## 2020-03-18 LAB — URINALYSIS, ROUTINE W REFLEX MICROSCOPIC
Bilirubin Urine: NEGATIVE
Hgb urine dipstick: NEGATIVE
Ketones, ur: NEGATIVE
Nitrite: NEGATIVE
RBC / HPF: NONE SEEN
Specific Gravity, Urine: 1.01 (ref 1.000–1.030)
Total Protein, Urine: NEGATIVE
Urine Glucose: NEGATIVE
Urobilinogen, UA: 0.2 (ref 0.0–1.0)
pH: 8 (ref 5.0–8.0)

## 2020-03-18 LAB — URINE CULTURE
MICRO NUMBER:: 11541852
SPECIMEN QUALITY:: ADEQUATE

## 2020-03-18 LAB — FECAL OCCULT BLOOD, IMMUNOCHEMICAL: Fecal Occult Bld: POSITIVE — AB

## 2020-03-18 NOTE — Telephone Encounter (Signed)
Lori from [facility] lab called to report a positive Ifob on Shannon [name] at 8:03 am on 03/18/2020.

## 2020-03-18 NOTE — Telephone Encounter (Signed)
See labs 

## 2020-03-19 ENCOUNTER — Telehealth: Payer: Self-pay | Admitting: Family Medicine

## 2020-03-19 MED ORDER — FAMOTIDINE 20 MG PO TABS: 20.0000 mg | ORAL_TABLET | Freq: Two times a day (BID) | ORAL | 2 refills | Status: DC

## 2020-03-19 NOTE — Telephone Encounter (Signed)
Patient called back in regards to fecal occult test. Patient stated she has stopped the vit c and aspirin she would like Famotidine to be sent to  [id number] - [place], [address] [place] [address] Phone:  [phone]  Fax:  [phone]     Patient states she has an appointment with GI on 03/25/20

## 2020-03-19 NOTE — Telephone Encounter (Signed)
Spoke with patient about labs and medication.  She stated that she is seeing GI on 2/24 and she will discuss with her about lab work and will call us if needed.

## 2020-03-24 DIAGNOSIS — M4310 Spondylolisthesis, site unspecified: Secondary | ICD-10-CM | POA: Diagnosis not present

## 2020-03-25 ENCOUNTER — Ambulatory Visit (INDEPENDENT_AMBULATORY_CARE_PROVIDER_SITE_OTHER): Payer: Medicare Other | Admitting: Gastroenterology

## 2020-03-25 ENCOUNTER — Encounter: Payer: Self-pay | Admitting: Gastroenterology

## 2020-03-25 VITALS — BP 122/74 | HR 77 | Ht 64.0 in | Wt 164.0 lb

## 2020-03-25 DIAGNOSIS — M6289 Other specified disorders of muscle: Secondary | ICD-10-CM

## 2020-03-25 DIAGNOSIS — K625 Hemorrhage of anus and rectum: Secondary | ICD-10-CM | POA: Diagnosis not present

## 2020-03-25 DIAGNOSIS — K602 Anal fissure, unspecified: Secondary | ICD-10-CM | POA: Diagnosis not present

## 2020-03-25 MED ORDER — AMBULATORY NON FORMULARY MEDICATION
1 refills | Status: DC
Start: 2020-03-25 — End: 2021-01-13

## 2020-03-25 NOTE — Progress Notes (Signed)
[facility]  Pharmacy: [facility]  Medication: Nitro 0.125% Gel Confirmation received: Yes Document and confirmation held for future reference: Yes

## 2020-03-25 NOTE — Patient Instructions (Addendum)
It was a pleasure to see you today. Based on our discussion, I am providing you with my recommendations below:  RECOMMENDATION(S):   I am recommending a high fiber diet. By increasing both dietary fiber (35 grams daily) and water intake, adding Citrucel or Metamucil daily, as well as Miralax daily will help to keep stools soft  I would also like for you to use Nitroglycerin 0.125% gel applied to the rectum 3 times daily for the next 6-8 weeks. This will provide additional relief for the anal fissure.  I understand your concerns and why you would like to proceed with repeat colonoscopy. After our discussion, we will plan a more conservative approach and will consider a repeat colonoscopy in the future.  [facility]:  . The following medication has been prescribed:   Nitroglycerin 0.125% gel applied topically to the rectum 3 times daily for the next 6-8 weeks  o This medication MUST be compounded by a compounding pharmacy. Your prescription has been sent to:  [facility] Address: [address], [place], [place] [address]  [phone]  . Please DO NOT go directly from our office to pick up this medication! Give the pharmacy 1 day to process the prescription. Extra time is required for them to compound your medication.  OVER THE COUNTER MEDICATION(S):   Please purchase the following medications over the counter and take as directed:  Marland Kitchen Miralax - please take 17g (1 capful) daily . Citrucel or Metamucil - please take 1 dose daily according to package instructions.  PT REFERRAL (PELVIC FLOOR):   I would like to refer you to Earlie Counts to help with your pelvice floor. You will receive a call from their office about your appointment date, time and location  BMI:  . If you are age 10 or older, your body mass index should be between 23-30. Your Body mass index is 28.15 kg/m. If this is out of the aforementioned range listed, please consider follow up with your  Primary Care Provider.  Thank you for trusting me with your gastrointestinal care!    [place]ton [place], MD, MPH

## 2020-03-25 NOTE — Progress Notes (Signed)
Referring Provider: Mosie Lukes, MD Primary Care Physician:  Mosie Lukes, MD  Chief complaint:  Blood in the stool   IMPRESSION:  Recurrent bright red blood in the stool - possible anal fissure Suspected obstructive defecation Hypertrophied anal papillae Post-prandial cramping, bloating and defection - improved  Recurrent bright red blood in the stool: Last colonoscopy 10/2018 and subsequent evaluation by Dr. Dema Severin 12/2018. Symptoms today sound most consistent with anal fissure. Will start treatment. Repeat colonoscopy if symptoms do not improve.  Suspected obstructive defecation: Referral to [facility] for pelvic floor PT and biofeedback. She is overall resistant, but, willing to give it a try.    PLAN:  - High fiber diet - increasing both dietary fiber (35 grams daily) and water intake  - Citrucel or Metamucil daily, add Miralax 17 g daily if needed to keep stools soft - Nitroglycerin 0.125% gel applied to the rectum TID x 6-8 weeks (not to be used with concurrent phosphodiesterase inhibitors) - Colonoscopy if not resolving (she will call to schedule) - Referral to [facility] for pelvic floor PT and biofeedback  Please see the "Patient Instructions" section for addition details about the plan.  HPI: Shannon [name] is a 75 y.o. female initially seen by me 10/14/18  for an abnormal rectal exam. The interval history is obtained through the patient, her husband who accompanies her to this appointment, review of records provided by Dr. Dema Severin after his consultation, and review of her electronic health record.  Worked for an [employer]. 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 Previously cared for her handicapped son who died after a stroke last year. She has a history of stage 1A breast cancer diagnosed in 2011 treated with neoadjuvant chemotherapy, definitive surgery, and radiation. She has nonischemic cardiomyopathy. Last echo in 2017 showed normal LVEF.    History of post-prandial bloating and diarrhea, intermittent bright red blood with straining, and frequent black tarry stools on iron supplements.  Referred in 2020 for a rectal polyp on physical exam when complained about black watery stools in June.   Normal screening colonoscopy with Dr. Deatra Ina 05/18/09. Colonoscopy 11/06/18 for rectal bleeding: diminutive hyperplastic polyp in the ascending colon, hypertrophied anal papillae.  Consultation with Dr. Dema Severin 12/02/2018: Hypertrophied anal papillae and suspected healed anal fissure.  He believes she has developed some degree of obstructive defecation given her symptom of pushing against an obstruction.  He recommended pelvic floor PT and biofeedback with [facility]. She did not follow through with the recommendations because her adult son had a stroke and had high needs before he died last year.   During routine annual follow-up with PA Saguier she reported bright red blood in the stool with bright red blood on the toilet paper. Happens most with straining or passing a large stool. The stools will be hard if she hasn't had a bowel movement for 3 or 4 days. Sense of complete evacuation.  No rectal pain. He felt she should be reevaluated by GI. She is concerned that she may have cancer.    Recent labs show no anemia.   FOBT negative 07/02/18.  Hgb 12.5, MCV 89.9, RDW 13, platelets 246 07/02/18 Hgb 12.6, MCV 89.3, RDW 13.2, platelets 284 02/18/20 FOBT positive 03/05/20    Past Medical History:  Diagnosis Date  . 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 Anemia 08/28/2011  . Asthma    as a child  . Brain tumor ([place])   .  Breast cancer ([place])    right breast  . Carpal tunnel syndrome of left wrist   . Cataracts, bilateral 12/28/2015  . Constipation 03/03/2012  . DEPRESSION 08/11/2008  . Dermatitis   . Facial fracture ([place])   . GERD 08/11/2008  . H/O measles   . H/O mumps   . History of blood transfusion   . History of chicken pox   .  History of hiatal hernia   . 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 HYPERLIPIDEMIA 08/11/2008  . HYPERTENSION 08/11/2008  . Hyponatremia 08/28/2011  . 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 Interstitial cystitis   . LIPOMA 02/10/2009  . Medicare annual wellness visit, subsequent 12/27/2014   Follows with Dr Amalia Hailey of urology Follows with Dr Posey Pronto at [facility] for opthamology Follows with Dr [name] of dermatology Follows with [facility] gastroenterology, last scope done in 2011 repeat in 10 years Last [facility] July 2015 should repeat every 1-2 years Last Pap December 2015, repeat in 3 years.   . Melanoma ([place]) 2008   DR. Ronnald Ramp  . Neuropathy   . NICM (nonischemic cardiomyopathy) ([place]) 03/08/2010   likely 2/2 chemotx - a. Echo 2012: EF 45-50%;  b. Lex MV 2/12:  low risk, apical defect (small area of ischemia vs shifting breast atten);  c.  Echo 7/12: Normal wall thickness, EF 60-65%, normal wall motion, grade 1 diastolic dysfunction, mild LAE, PASP 32;   d. Lex MV 11/13:  EF 76%, no ischemia  . OA (osteoarthritis) 09/07/2011  . Obesity 09/28/2014  . Osteoporosis   . Personal history of chemotherapy 2012  . Personal history of radiation therapy 2012  . PREDIABETES 06/15/2009  . Recurrent falls 12/28/2015  . Shortness of breath   . UTI (lower urinary tract infection) 03/03/2012  . Vertebral fracture, osteoporotic, sequela 04/05/2016    Past Surgical History:  Procedure Laterality Date  . BREAST LUMPECTOMY  04/2009   RIGHT FOR BREAST CANCER-CHEMO/RADIATION X 1 YEAR  . BREAST REDUCTION SURGERY Bilateral 05/04/2014   Procedure: MAMMARY REDUCTION  (BREAST);  Surgeon: Cristine Polio, MD;  Location: [place];  Service: Plastics;  Laterality: Bilateral;  . CARPAL TUNNEL RELEASE     L wrist, ulnar nerve moved  . CHOLECYSTECTOMY    . CRANIOTOMY FOR TUMOR  2000  . ELBOW SURGERY     left  . KNEE ARTHROSCOPY Left 10/12/2014    Procedure: LEFT KNEE ARTHROSCOPY ;  Surgeon: Gaynelle Arabian, MD;  [facility];  Service: Orthopedics;  Laterality: Left;  . KYPHOPLASTY N/A 03/30/2016   Procedure: THORACIC 12 KYPHOPLASTY;  Surgeon: Phylliss Bob, MD;  Location: [place];  Service: Orthopedics;  Laterality: N/A;  THORACIC 12 KYPHOPLASTY  . LEFT HEART CATHETERIZATION WITH CORONARY ANGIOGRAM N/A 12/16/2013   Procedure: LEFT HEART CATHETERIZATION WITH CORONARY ANGIOGRAM;  Surgeon: Peter M [name], MD;  Location: [facility] CATH LAB;  Service: Cardiovascular;  Laterality: N/A;  . LIPOMA EXCISION  03/28/2009   right leg  . MELANOMA EXCISION    . PORT-A-CATH REMOVAL  11/30/2010   Streck  . porta cath    . PORTACATH PLACEMENT  may 2011  . REDUCTION MAMMAPLASTY Bilateral   . SYNOVECTOMY Left 10/12/2014   Procedure: WITH SYNOVECTOMY;  Surgeon: Gaynelle Arabian, MD;  [facility];  Service: Orthopedics;  Laterality: Left;  . TONSILLECTOMY  1958  . TOTAL KNEE ARTHROPLASTY  02/05/2012   Procedure: TOTAL  KNEE ARTHROPLASTY;  Surgeon: Gearlean Alf, MD;  [facility];  Service: Orthopedics;  Laterality: Right;  . TOTAL KNEE ARTHROPLASTY Left 02/10/2013   Procedure: LEFT TOTAL KNEE ARTHROPLASTY;  Surgeon: Gearlean Alf, MD;  [facility];  Service: Orthopedics;  Laterality: Left;  . total knee raplacement  01-2012   Right Knee  . TUBAL LIGATION  1997    Current Outpatient Medications  Medication Sig Dispense Refill  . acetaminophen (TYLENOL) 500 MG tablet Take 500-1,000 mg by mouth every 6 (six) hours as needed for moderate pain or headache.     . albuterol (VENTOLIN HFA) 108 (90 Base) MCG/ACT inhaler Inhale 1-2 puffs into the lungs every 6 (six) hours as needed for wheezing or shortness of breath. 18 g 3  . Ascorbic Acid (VITAMIN C PO) Take 1 tablet by mouth daily.    Marland Kitchen aspirin EC 81 MG tablet Take 81 mg by mouth daily.    . B Complex Vitamins (B COMPLEX PO) Take 1 tablet by mouth daily.    . carvedilol (COREG) 25 MG tablet TAKE 1  TABLET TWICE DAILY WITH A MEAL 180 tablet 1  . clonazePAM (KLONOPIN) 0.5 MG tablet Take 0.5-1 tablets (0.25-0.5 mg total) by mouth 2 (two) times daily as needed for anxiety. 60 tablet 1  . famotidine (PEPCID) 20 MG tablet Take 1 tablet (20 mg total) by mouth 2 (two) times daily. 60 tablet 2  . ferrous sulfate 325 (65 FE) MG tablet Take 325 mg by mouth daily with breakfast.    . folic acid (FOLVITE) 1 MG tablet Take 1 tablet (1 mg total) by mouth daily. 90 tablet 0  . gabapentin (NEURONTIN) 300 MG capsule Take 1 capsule (300 mg total) by mouth 2 (two) times daily as needed. 180 capsule 0  . hydrOXYzine (ATARAX/VISTARIL) 25 MG tablet Take 25 mg by mouth at bedtime.    . Multiple Vitamin (MULTIVITAMIN WITH MINERALS) TABS tablet Take 1 tablet by mouth daily.    . pentosan polysulfate (ELMIRON) 100 MG capsule Take 100 mg by mouth 2 (two) times daily.     . rosuvastatin (CRESTOR) 20 MG tablet TAKE 1 TABLET AT BEDTIME 90 tablet 3  . telmisartan (MICARDIS) 80 MG tablet TAKE 1 TABLET EVERY DAY (Patient taking differently: Takes 1 /2 tablet daily.) 90 tablet 0  . venlafaxine XR (EFFEXOR-XR) 75 MG 24 hr capsule Take 2 capsules (150 mg total) by mouth daily with breakfast. 180 capsule 1   No current facility-administered medications for this visit.    Allergies as of 03/25/2020 - Review Complete 03/25/2020  Allergen Reaction Noted  . Dilaudid [hydromorphone hcl] Itching 02/07/2013    Family History  Problem Relation Age of Onset  . Breast cancer Mother        sarcoma  . Lung cancer Mother   . Hypertension Mother   . Prostate cancer Father   . Congestive Heart Failure Father   . Heart attack Father   . Prostate cancer Brother   . Down syndrome Son   . CVA Son   . Heart disease Maternal Grandfather        MI  . Stomach cancer Maternal Aunt   . Uterine cancer Maternal Aunt   . Colon cancer Neg Hx      Physical Exam: General:   Alert,  well-nourished, pleasant and cooperative in NAD Head:   Normocephalic and atraumatic. Eyes:  Sclera clear, no icterus.   Conjunctiva pink. Ears:  Normal auditory acuity. Nose:  No deformity, discharge,  or lesions. Mouth:  No deformity or lesions.   Neck:  Supple; no masses or thyromegaly. Lungs:  Clear throughout to auscultation.   No wheezes. Heart:  Regular rate and rhythm; no murmurs. Abdomen:  Soft, nontender, nondistended, normal bowel sounds, no rebound or guarding. No hepatosplenomegaly.   Rectal:  Deferred  Msk:  Symmetrical. No boney deformities LAD: No inguinal or umbilical LAD Extremities:  No clubbing or edema. Neurologic:  Alert and  oriented x4;  grossly nonfocal Skin:  Intact without significant lesions or rashes. Psych:  Alert and cooperative. Normal mood and affect.    Quantez Schnyder L. Tarri Glenn, MD, MPH 03/25/2020, 3:10 PM

## 2020-04-01 ENCOUNTER — Ambulatory Visit: Payer: Medicare Other | Admitting: Neurology

## 2020-04-01 DIAGNOSIS — L304 Erythema intertrigo: Secondary | ICD-10-CM | POA: Diagnosis not present

## 2020-04-01 DIAGNOSIS — L98499 Non-pressure chronic ulcer of skin of other sites with unspecified severity: Secondary | ICD-10-CM | POA: Diagnosis not present

## 2020-04-01 DIAGNOSIS — L814 Other melanin hyperpigmentation: Secondary | ICD-10-CM | POA: Diagnosis not present

## 2020-04-01 DIAGNOSIS — L821 Other seborrheic keratosis: Secondary | ICD-10-CM | POA: Diagnosis not present

## 2020-04-01 DIAGNOSIS — B0089 Other herpesviral infection: Secondary | ICD-10-CM | POA: Diagnosis not present

## 2020-04-01 DIAGNOSIS — L900 Lichen sclerosus et atrophicus: Secondary | ICD-10-CM | POA: Diagnosis not present

## 2020-04-01 DIAGNOSIS — L565 Disseminated superficial actinic porokeratosis (DSAP): Secondary | ICD-10-CM | POA: Diagnosis not present

## 2020-04-01 DIAGNOSIS — D1801 Hemangioma of skin and subcutaneous tissue: Secondary | ICD-10-CM | POA: Diagnosis not present

## 2020-04-01 DIAGNOSIS — D225 Melanocytic nevi of trunk: Secondary | ICD-10-CM | POA: Diagnosis not present

## 2020-04-02 ENCOUNTER — Other Ambulatory Visit: Payer: Self-pay

## 2020-04-02 ENCOUNTER — Other Ambulatory Visit (INDEPENDENT_AMBULATORY_CARE_PROVIDER_SITE_OTHER): Payer: Medicare Other

## 2020-04-02 DIAGNOSIS — K625 Hemorrhage of anus and rectum: Secondary | ICD-10-CM | POA: Diagnosis not present

## 2020-04-02 LAB — CBC
HCT: 37.4 % (ref 36.0–46.0)
Hemoglobin: 12.5 g/dL (ref 12.0–15.0)
MCHC: 33.5 g/dL (ref 30.0–36.0)
MCV: 88 fl (ref 78.0–100.0)
Platelets: 254 10*3/uL (ref 150.0–400.0)
RBC: 4.25 Mil/uL (ref 3.87–5.11)
RDW: 14 % (ref 11.5–15.5)
WBC: 6.1 10*3/uL (ref 4.0–10.5)

## 2020-04-09 ENCOUNTER — Other Ambulatory Visit (HOSPITAL_COMMUNITY): Payer: Self-pay | Admitting: Student

## 2020-04-09 DIAGNOSIS — Z96652 Presence of left artificial knee joint: Secondary | ICD-10-CM | POA: Diagnosis not present

## 2020-04-09 DIAGNOSIS — Z96659 Presence of unspecified artificial knee joint: Secondary | ICD-10-CM

## 2020-04-09 NOTE — Progress Notes (Signed)
NEUROLOGY NOTE  Shannon [name] MRN: [id number] DOB: 09/28/1945  Referring provider: Mackie Pai, PA-C Primary care provider: Penni Homans, MD  Reason for consult:  Altered mental status  Assessment/Plan:   1.  Neurocognitive disorder - concern for Alzheimer's 2.  Adjustment disorder with depression  1.  Start donepezil 71m at bedtime.  Increase to 146mat bedtime in 4 weeks. 2.  Given history of breast cancer and melanoma, will check MRI of brain WITH contrast to evaluate for leptomeningeal enhancement 3.  Check B12 4.  Neuropsychological evaluation 5.  Advised daughter to look over mother's finances 6.  Follow up after testing.   Subjective:  Shannon [name] a [age]ear old right-handed female with HTN, OA, neuropathy and history of craniopharyngioma s/p resection, breast cancer and melanoma who presents for altered mental status.  History supplemented by ED and referring provider's notes.  She is accompanied by her daughter who also provides history.  2021 had been a difficult year emotionally for her.  She is caring for her husband with dementia.  Her son passed away.  She started having headaches in December 2021.  It followed cataract surgery, so she thought it was due to the eye drops but it persisted after stopping the drops.  They were severe daily headaches that moved around different locations on her head.  She has had some memory problems for a couple of years which has been noticeable to herself and family over the past year.  When talking about recent events, she will forget, such as where she had lunch or what she did that day.  She will repeat herself 5 to 10 times during the same conversation.  Her daughter started managing her medication because she would sometimes forget to take her medications despite using a pill box.  So far, she has been paying her bills without difficulty.  She drives but denies difficulty or disorientation.  One time, she thought that she was  playing hide and seek with her granddaughter who was not there.  On 02/18/2020, she exhibited altered mental status.  She thought her mother who is long passed, was in the house cooking dinner. She went to the ED for further evaluation.  CT head personally reviewed unremarkable.  UA showed possible UTI and was prescribed Keflex.  CXR negative and labs negative for leukocytosis or electrolyte imbalance.  She was also dehydrated.  Followed up with primary care.  MRI of brain without contrast on 03/02/2020 personally reviewed showed mild cerebral atrophy with chronic small vessel ischemic changes and sequelae of prior right pterional craniotomy with no visible tumor recurrence but no acute abnormality.  Headaches now are infrequent, mild and lasting a couple of hours about 2 days a week.  TSH from January was 2.26.   Current NSAIDs/analgesics:  Acetaminophen Current antidepressant:  Venlafaxine XR 15079maily Current antiepileptic medications:  Gabapentin 300m79mD PRN Current anti-anxiolytic:  Clonazepam PRN, hydroxyzine  PAST MEDICAL HISTORY: Past Medical History:  Diagnosis Date  . Adult craniopharyngioma (H[facility])[place]00   pituitary  . Anemia 08/28/2011  . Asthma    as a child  . Brain tumor ([facility])[place]. Breast cancer (HCC)[place] right breast  . Carpal tunnel syndrome of left wrist   . Cataracts, bilateral 12/28/2015  . Constipation 03/03/2012  . DEPRESSION 08/11/2008  . Dermatitis   . Facial fracture (HCC)[place]. GERD 08/11/2008  . H/O measles   . H/O mumps   .  History of blood transfusion   . History of chicken pox   . History of hiatal hernia   . 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 HYPERLIPIDEMIA 08/11/2008  . HYPERTENSION 08/11/2008  . Hyponatremia 08/28/2011  . 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 Interstitial cystitis   . LIPOMA 02/10/2009  . Medicare annual wellness visit, subsequent 12/27/2014   Follows with Dr Amalia Hailey of  urology Follows with Dr Posey Pronto at [facility] for opthamology Follows with Dr [name] of dermatology Follows with [facility] gastroenterology, last scope done in 2011 repeat in 10 years Last [facility] July 2015 should repeat every 1-2 years Last Pap December 2015, repeat in 3 years.   . Melanoma ([place]) 2008   DR. Ronnald Ramp  . Neuropathy   . NICM (nonischemic cardiomyopathy) ([place]) 03/08/2010   likely 2/2 chemotx - a. Echo 2012: EF 45-50%;  b. Lex MV 2/12:  low risk, apical defect (small area of ischemia vs shifting breast atten);  c.  Echo 7/12: Normal wall thickness, EF 60-65%, normal wall motion, grade 1 diastolic dysfunction, mild LAE, PASP 32;   d. Lex MV 11/13:  EF 76%, no ischemia  . OA (osteoarthritis) 09/07/2011  . Obesity 09/28/2014  . Osteoporosis   . Personal history of chemotherapy 2012  . Personal history of radiation therapy 2012  . PREDIABETES 06/15/2009  . Recurrent falls 12/28/2015  . Shortness of breath   . UTI (lower urinary tract infection) 03/03/2012  . Vertebral fracture, osteoporotic, sequela 04/05/2016    PAST SURGICAL HISTORY: Past Surgical History:  Procedure Laterality Date  . BREAST LUMPECTOMY  04/2009   RIGHT FOR BREAST CANCER-CHEMO/RADIATION X 1 YEAR  . BREAST REDUCTION SURGERY Bilateral 05/04/2014   Procedure: MAMMARY REDUCTION  (BREAST);  Surgeon: Cristine Polio, MD;  Location: [place];  Service: Plastics;  Laterality: Bilateral;  . CARPAL TUNNEL RELEASE     L wrist, ulnar nerve moved  . CHOLECYSTECTOMY    . CRANIOTOMY FOR TUMOR  2000  . ELBOW SURGERY     left  . KNEE ARTHROSCOPY Left 10/12/2014   Procedure: LEFT KNEE ARTHROSCOPY ;  Surgeon: Gaynelle Arabian, MD;  [facility];  Service: Orthopedics;  Laterality: Left;  . KYPHOPLASTY N/A 03/30/2016   Procedure: THORACIC 12 KYPHOPLASTY;  Surgeon: Phylliss Bob, MD;  Location: [place];  Service: Orthopedics;  Laterality: N/A;  THORACIC 12 KYPHOPLASTY  . LEFT HEART CATHETERIZATION WITH CORONARY ANGIOGRAM N/A 12/16/2013    Procedure: LEFT HEART CATHETERIZATION WITH CORONARY ANGIOGRAM;  Surgeon: Peter M [name], MD;  Location: [facility] CATH LAB;  Service: Cardiovascular;  Laterality: N/A;  . LIPOMA EXCISION  03/28/2009   right leg  . MELANOMA EXCISION    . PORT-A-CATH REMOVAL  11/30/2010   Streck  . porta cath    . PORTACATH PLACEMENT  may 2011  . REDUCTION MAMMAPLASTY Bilateral   . SYNOVECTOMY Left 10/12/2014   Procedure: WITH SYNOVECTOMY;  Surgeon: Gaynelle Arabian, MD;  [facility];  Service: Orthopedics;  Laterality: Left;  . TONSILLECTOMY  1958  . TOTAL KNEE ARTHROPLASTY  02/05/2012   Procedure: TOTAL KNEE ARTHROPLASTY;  Surgeon: Gearlean Alf, MD;  [facility];  Service: Orthopedics;  Laterality: Right;  . TOTAL KNEE ARTHROPLASTY Left 02/10/2013   Procedure: LEFT TOTAL KNEE ARTHROPLASTY;  Surgeon: Gearlean Alf, MD;  [facility];  Service: Orthopedics;  Laterality: Left;  . total knee raplacement  01-2012   Right Knee  . TUBAL LIGATION  1997    MEDICATIONS: Current Outpatient Medications on File Prior to Visit  Medication Sig Dispense Refill  . acetaminophen (TYLENOL) 500 MG tablet Take 500-1,000 mg by mouth every 6 (six) hours as needed for moderate pain or headache.     . albuterol (VENTOLIN HFA) 108 (90 Base) MCG/ACT inhaler Inhale 1-2 puffs into the lungs every 6 (six) hours as needed for wheezing or shortness of breath. 18 g 3  . AMBULATORY NON FORMULARY MEDICATION Nitroglycerin 0.125% gel applied topically to the rectum TID x 6-8 weeks 1 each 1  . Ascorbic Acid (VITAMIN C PO) Take 1 tablet by mouth daily.    Marland Kitchen aspirin EC 81 MG tablet Take 81 mg by mouth daily.    . B Complex Vitamins (B COMPLEX PO) Take 1 tablet by mouth daily.    . carvedilol (COREG) 25 MG tablet TAKE 1 TABLET TWICE DAILY WITH A MEAL 180 tablet 1  . clonazePAM (KLONOPIN) 0.5 MG tablet Take 0.5-1 tablets (0.25-0.5 mg total) by mouth 2 (two) times daily as needed for anxiety. 60 tablet 1  . famotidine (PEPCID) 20 MG tablet  Take 1 tablet (20 mg total) by mouth 2 (two) times daily. 60 tablet 2  . ferrous sulfate 325 (65 FE) MG tablet Take 325 mg by mouth daily with breakfast.    . folic acid (FOLVITE) 1 MG tablet Take 1 tablet (1 mg total) by mouth daily. 90 tablet 0  . gabapentin (NEURONTIN) 300 MG capsule Take 1 capsule (300 mg total) by mouth 2 (two) times daily as needed. 180 capsule 0  . hydrOXYzine (ATARAX/VISTARIL) 25 MG tablet Take 25 mg by mouth at bedtime.    . Multiple Vitamin (MULTIVITAMIN WITH MINERALS) TABS tablet Take 1 tablet by mouth daily.    . pentosan polysulfate (ELMIRON) 100 MG capsule Take 100 mg by mouth 2 (two) times daily.     . rosuvastatin (CRESTOR) 20 MG tablet TAKE 1 TABLET AT BEDTIME 90 tablet 3  . telmisartan (MICARDIS) 80 MG tablet TAKE 1 TABLET EVERY DAY (Patient taking differently: Takes 1 /2 tablet daily.) 90 tablet 0  . venlafaxine XR (EFFEXOR-XR) 75 MG 24 hr capsule Take 2 capsules (150 mg total) by mouth daily with breakfast. 180 capsule 1   No current facility-administered medications on file prior to visit.    ALLERGIES: Allergies  Allergen Reactions  . Dilaudid [Hydromorphone Hcl] Itching    FAMILY HISTORY: Family History  Problem Relation Age of Onset  . Breast cancer Mother        sarcoma  . Lung cancer Mother   . Hypertension Mother   . Prostate cancer Father   . Congestive Heart Failure Father   . Heart attack Father   . Prostate cancer Brother   . Down syndrome Son   . CVA Son   . Heart disease Maternal Grandfather        MI  . Stomach cancer Maternal Aunt   . Uterine cancer Maternal Aunt   . Colon cancer Neg Hx     Objective:  Blood pressure 117/73, pulse 79, resp. rate 20, height 5' (1.524 m), weight 175 lb (79.4 kg), last menstrual period 01/30/1994, SpO2 95 %. General: No acute distress.  Patient appears well-groomed.   Head:  Normocephalic/atraumatic Eyes:  fundi examined but not visualized Neck: supple, no paraspinal tenderness, full range  of motion Back: No paraspinal tenderness Heart: regular rate and rhythm Lungs: Clear  to auscultation bilaterally. Vascular: No carotid bruits. Neurological Exam: Mental status:  [employer] Mental Exam 04/12/2020  Weekday Correct 1  Current year 1  What state are we in? 1  Amount spent 0  Amount left 0  # of Animals 2  5 objects recall 2  Number series 1  Hour markers 0  Time correct 0  Placed X in triangle correctly 1  Largest Figure 1  Name of female 2  Date back to work 2  Type of work [employer] she lived in 2  Total score 16   Cranial nerves: CN I: not tested CN II: pupils equal, round and reactive to light, visual fields intact CN III, IV, VI:  full range of motion, no nystagmus, no ptosis CN V: facial sensation intact. CN VII: upper and lower face symmetric CN VIII: hearing intact CN IX, X: gag intact, uvula midline CN XI: sternocleidomastoid and trapezius muscles intact CN XII: tongue midline Bulk & Tone: normal, no fasciculations. Motor:  muscle strength 5/5 throughout Sensation:  light touch sensation intact. Deep Tendon Reflexes:  2+ throughout,  toes downgoing.   Finger to nose testing:  Without dysmetria.   Heel to shin:  Without dysmetria.   Gait:  Normal station and stride.  Romberg negative.    Thank you for allowing me to take part in the care of this patient.  Metta Clines, DO  CC:  Penni Homans, MD  Mackie Pai, PA-C

## 2020-04-12 ENCOUNTER — Ambulatory Visit: Payer: Medicare Other | Admitting: Physical Therapy

## 2020-04-12 ENCOUNTER — Other Ambulatory Visit (INDEPENDENT_AMBULATORY_CARE_PROVIDER_SITE_OTHER): Payer: Medicare Other

## 2020-04-12 ENCOUNTER — Ambulatory Visit (INDEPENDENT_AMBULATORY_CARE_PROVIDER_SITE_OTHER): Payer: Medicare Other | Admitting: Neurology

## 2020-04-12 ENCOUNTER — Encounter: Payer: Self-pay | Admitting: Neurology

## 2020-04-12 ENCOUNTER — Other Ambulatory Visit (HOSPITAL_COMMUNITY): Payer: Self-pay | Admitting: Student

## 2020-04-12 ENCOUNTER — Other Ambulatory Visit: Payer: Self-pay

## 2020-04-12 VITALS — BP 117/73 | HR 79 | Resp 20 | Ht 60.0 in | Wt 175.0 lb

## 2020-04-12 DIAGNOSIS — Z96659 Presence of unspecified artificial knee joint: Secondary | ICD-10-CM

## 2020-04-12 DIAGNOSIS — R419 Unspecified symptoms and signs involving cognitive functions and awareness: Secondary | ICD-10-CM | POA: Diagnosis not present

## 2020-04-12 DIAGNOSIS — Z853 Personal history of malignant neoplasm of breast: Secondary | ICD-10-CM

## 2020-04-12 DIAGNOSIS — Z96652 Presence of left artificial knee joint: Secondary | ICD-10-CM

## 2020-04-12 DIAGNOSIS — Z85820 Personal history of malignant melanoma of skin: Secondary | ICD-10-CM

## 2020-04-12 LAB — VITAMIN B12: Vitamin B-12: >1506 pg/mL — ABNORMAL HIGH (ref 211–911)

## 2020-04-12 MED ORDER — DONEPEZIL HCL 5 MG PO TABS: 5.0000 mg | ORAL_TABLET | Freq: Every day | ORAL | 0 refills | Status: DC

## 2020-04-12 NOTE — Patient Instructions (Addendum)
1. We will start donepezil (Aricept) 5mg  daily for four weeks.  If you are tolerating the medication, then after four weeks, we will increase the dose to 10mg  daily.  Side effects include nausea, vomiting, diarrhea, vivid dreams, and muscle cramps.  Please call the clinic if you experience any of these symptoms. 2.  Will check MRI of brain WITH contrast. We have sent a referral to [facility] for your MRI and they will call you directly to schedule your appointment. They are located at [place]. If you need to contact them directly please call [phone].  3.  Check B12 level. Your provider has requested that you have labwork completed today. Please go to [facility] ([id number]) on the second floor of this building before leaving the office today. You do not need to check in. If you are not called within 15 minutes please check with the front desk.  4.  Neurocognitive testing 5.  Follow up after testing

## 2020-04-14 ENCOUNTER — Encounter (HOSPITAL_BASED_OUTPATIENT_CLINIC_OR_DEPARTMENT_OTHER): Payer: Medicare Other | Attending: Physician Assistant | Admitting: Physician Assistant

## 2020-04-14 ENCOUNTER — Other Ambulatory Visit: Payer: Self-pay

## 2020-04-14 ENCOUNTER — Other Ambulatory Visit: Payer: Self-pay | Admitting: Cardiology

## 2020-04-14 DIAGNOSIS — L98492 Non-pressure chronic ulcer of skin of other sites with fat layer exposed: Secondary | ICD-10-CM | POA: Diagnosis not present

## 2020-04-14 DIAGNOSIS — Z859 Personal history of malignant neoplasm, unspecified: Secondary | ICD-10-CM | POA: Insufficient documentation

## 2020-04-14 DIAGNOSIS — L988 Other specified disorders of the skin and subcutaneous tissue: Secondary | ICD-10-CM | POA: Diagnosis not present

## 2020-04-15 NOTE — Progress Notes (Signed)
BIANKA, LIBERATI ([id number]) Visit Report for 04/14/2020 Allergy List Details Patient Name: Date of Service: EVANGELENE, VORA 04/14/2020 1:15 PM Medical Record Number: [id number] Patient Account Number: [id number] Date of Birth/Sex: Treating RN: 1945-02-17 (75 y.o. Female) Levan Hurst Primary Care Lorian Yaun: Penni Homans Other Clinician: Referring Dorthy Hustead: Treating Special Ranes/Extender: Clemmie Krill Weeks in Treatment: 0 Allergies Active Allergies Dilaudid Reaction: itching Allergy Notes Electronic Signature(s) Signed: 04/15/2020 5:54:24 PM By: Levan Hurst RN, BSN Entered By: Levan Hurst on 04/14/2020 13:20:58 -------------------------------------------------------------------------------- Arrival Information Details Patient Name: Date of Service: Clair Gulling D. 04/14/2020 1:15 PM Medical Record Number: [id number] Patient Account Number: [id number] Date of Birth/Sex: Treating RN: Sep 02, 1945 (75 y.o. Female) Levan Hurst Primary Care Arti Trang: Penni Homans Other Clinician: Referring Elmor Kost: Treating Dustyn Dansereau/Extender: Dyanne Carrel in Treatment: 0 Visit Information Patient Arrived: Ambulatory Arrival Time: 13:12 Accompanied By: alone Transfer Assistance: None Patient Identification Verified: Yes Secondary Verification Process Completed: Yes Patient Requires Transmission-Based Precautions: No Patient Has Alerts: No Electronic Signature(s) Signed: 04/15/2020 5:54:24 PM By: Levan Hurst RN, BSN Entered By: Levan Hurst on 04/14/2020 13:14:31 -------------------------------------------------------------------------------- Clinic Level of Care Assessment Details Patient Name: Date of Service: AMILEE, JANVIER D. 04/14/2020 1:15 PM Medical Record Number: [id number] Patient Account Number: [id number] Date of Birth/Sex: Treating RN: [birth year] (75 y.o. Female) Baruch Gouty Primary Care Sheba Whaling: Penni Homans Other  Clinician: Referring Yuette Putnam: Treating Adilyn Humes/Extender: Clemmie Krill Weeks in Treatment: 0 Clinic Level of Care Assessment Items TOOL 2 Quantity Score []  - 0 Use when only an EandM is performed on the INITIAL visit ASSESSMENTS - Nursing Assessment / Reassessment X- 1 20 General Physical Exam (combine w/ comprehensive assessment (listed just below) when performed on new pt. evals) X- 1 25 Comprehensive Assessment (HX, ROS, Risk Assessments, Wounds Hx, etc.) ASSESSMENTS - Wound and Skin A ssessment / Reassessment []  - 0 Simple Wound Assessment / Reassessment - one wound []  - 0 Complex Wound Assessment / Reassessment - multiple wounds X- 1 10 Dermatologic / Skin Assessment (not related to wound area) ASSESSMENTS - Ostomy and/or Continence Assessment and Care []  - 0 Incontinence Assessment and Management []  - 0 Ostomy Care Assessment and Management (repouching, etc.) PROCESS - Coordination of Care X - Simple Patient / Family Education for ongoing care 1 15 []  - 0 Complex (extensive) Patient / Family Education for ongoing care X- 1 10 Staff obtains Programmer, systems, Records, T Results / Process Orders est []  - 0 Staff telephones HHA, Nursing Homes / Clarify orders / etc []  - 0 Routine Transfer to another Facility (non-emergent condition) []  - 0 Routine Hospital Admission (non-emergent condition) X- 1 15 New Admissions / Biomedical engineer / Ordering NPWT Apligraf, etc. , []  - 0 Emergency Hospital Admission (emergent condition) X- 1 10 Simple Discharge Coordination []  - 0 Complex (extensive) Discharge Coordination PROCESS - Special Needs []  - 0 Pediatric / Minor Patient Management []  - 0 Isolation Patient Management []  - 0 Hearing / Language / Visual special needs []  - 0 Assessment of Community assistance (transportation, D/C planning, etc.) []  - 0 Additional assistance / Altered mentation []  - 0 Support Surface(s) Assessment (bed, cushion,  seat, etc.) INTERVENTIONS - Wound Cleansing / Measurement X- 1 5 Wound Imaging (photographs - any number of wounds) []  - 0 Wound Tracing (instead of photographs) X- 1 5 Simple Wound Measurement - one wound []  - 0 Complex Wound Measurement - multiple wounds X- 1 5 Simple Wound Cleansing - one wound []  -  0 Complex Wound Cleansing - multiple wounds INTERVENTIONS - Wound Dressings X - Small Wound Dressing one or multiple wounds 1 10 []  - 0 Medium Wound Dressing one or multiple wounds []  - 0 Large Wound Dressing one or multiple wounds []  - 0 Application of Medications - injection INTERVENTIONS - Miscellaneous []  - 0 External ear exam []  - 0 Specimen Collection (cultures, biopsies, blood, body fluids, etc.) []  - 0 Specimen(s) / Culture(s) sent or taken to Lab for analysis []  - 0 Patient Transfer (multiple staff / Harrel Lemon Lift / Similar devices) []  - 0 Simple Staple / Suture removal (25 or less) []  - 0 Complex Staple / Suture removal (26 or more) []  - 0 Hypo / Hyperglycemic Management (close monitor of Blood Glucose) []  - 0 Ankle / Brachial Index (ABI) - do not check if billed separately Has the patient been seen at the hospital within the last three years: Yes Total Score: 130 Level Of Care: New/Established - Level 4 Electronic Signature(s) Signed: 04/15/2020 5:44:19 PM By: Baruch Gouty RN, BSN Entered By: Baruch Gouty on 04/14/2020 13:42:20 -------------------------------------------------------------------------------- Encounter Discharge Information Details Patient Name: Date of Service: Clair Gulling D. 04/14/2020 1:15 PM Medical Record Number: [id number] Patient Account Number: [id number] Date of Birth/Sex: Treating RN: [birth year] (75 y.o. Female) Rhae Hammock Primary Care Samuel Rittenhouse: Penni Homans Other Clinician: Referring Dashan Chizmar: Treating Aladdin Kollmann/Extender: Dyanne Carrel in Treatment: 0 Encounter Discharge Information Items Post  Procedure Vitals Discharge Condition: Stable Temperature (F): 98.7 Ambulatory Status: Ambulatory Pulse (bpm): 74 Discharge Destination: Home Respiratory Rate (breaths/min): 17 Transportation: Private Auto Blood Pressure (mmHg): 165/83 Accompanied By: self Schedule Follow-up Appointment: Yes Clinical Summary of Care: Patient Declined Electronic Signature(s) Signed: 04/14/2020 5:17:38 PM By: Rhae Hammock RN Entered By: Rhae Hammock on 04/14/2020 14:56:53 -------------------------------------------------------------------------------- [place] Details Patient Name: Date of Service: Clair Gulling D. 04/14/2020 1:15 PM Medical Record Number: [id number] Patient Account Number: [id number] Date of Birth/Sex: Treating RN: 1945-09-09 (75 y.o. Female) Baruch Gouty Primary Care Arnita Koons: Penni Homans Other Clinician: Referring Aris Moman: Treating Arrianna Catala/Extender: Dyanne Carrel in Treatment: [place] reviewed with physician Active Inactive Wound/Skin Impairment Nursing Diagnoses: Impaired tissue integrity Knowledge deficit related to ulceration/compromised skin integrity Goals: Patient/caregiver will verbalize understanding of skin care regimen Date Initiated: 04/14/2020 Target Resolution Date: 05/12/2020 Goal Status: Active Ulcer/skin breakdown will have a volume reduction of 30% by week 4 Date Initiated: 04/14/2020 Target Resolution Date: 05/12/2020 Goal Status: Active Interventions: Assess patient/caregiver ability to obtain necessary supplies Assess patient/caregiver ability to perform ulcer/skin care regimen upon admission and as needed Assess ulceration(s) every visit Provide education on ulcer and skin care Treatment Activities: Skin care regimen initiated : 04/14/2020 Topical wound management initiated : 04/14/2020 Notes: Electronic Signature(s) Signed: 04/15/2020 5:44:19 PM By: Baruch Gouty RN,  BSN Entered By: Baruch Gouty on 04/14/2020 13:40:01 -------------------------------------------------------------------------------- Pain Assessment Details Patient Name: Date of Service: Clair Gulling D. 04/14/2020 1:15 PM Medical Record Number: [id number] Patient Account Number: [id number] Date of Birth/Sex: Treating RN: 1945-04-05 (75 y.o. Female) Levan Hurst Primary Care Gleen Ripberger: Penni Homans Other Clinician: Referring Hayze Gazda: Treating Taiten Brawn/Extender: Clemmie Krill Weeks in Treatment: 0 Active Problems Location of Pain Severity and Description of Pain Patient Has Paino No Site Locations Pain Management and Medication Current Pain Management: Electronic Signature(s) Signed: 04/15/2020 5:54:24 PM By: Levan Hurst RN, BSN Entered By: Levan Hurst on 04/14/2020 13:28:43 -------------------------------------------------------------------------------- Patient/Caregiver Education Details Patient Name: Date of Service: Clair Gulling  D. 3/16/2022andnbsp1:15 PM Medical Record Number: [id number] Patient Account Number: [id number] Date of Birth/Gender: Treating RN: [birth year] (75 y.o. Female) Baruch Gouty Primary Care Physician: Penni Homans Other Clinician: Referring Physician: Treating Physician/Extender: Dyanne Carrel in Treatment: 0 Education Assessment Education Provided To: Patient Education Topics Provided [place]: o Handouts: Welcome T [facility] o Methods: Explain/Verbal, Printed Responses: Reinforcements needed, State content correctly Wound/Skin Impairment: Handouts: Caring for Your Ulcer, Skin Care Do's and Dont's Methods: Explain/Verbal, Printed Responses: Reinforcements needed, State content correctly Electronic Signature(s) Signed: 04/15/2020 5:44:19 PM By: Baruch Gouty RN, BSN Entered By: Baruch Gouty on 04/14/2020  13:41:03 -------------------------------------------------------------------------------- Wound Assessment Details Patient Name: Date of Service: Clair Gulling D. 04/14/2020 1:15 PM Medical Record Number: [id number] Patient Account Number: [id number] Date of Birth/Sex: Treating RN: 1945-11-27 (75 y.o. Female) Levan Hurst Primary Care Courtnie Brenes: Penni Homans Other Clinician: Referring Kortni Hasten: Treating Jeffery Bachmeier/Extender: Clemmie Krill Weeks in Treatment: 0 Wound Status Wound Number: 1 Primary Etiology: MASD Wound Location: Right Breast Wound Status: Open Wounding Event: Gradually Appeared Comorbid History: Cataracts, Asthma, Hypertension Date Acquired: 01/31/2020 Weeks Of Treatment: 0 Clustered Wound: No Photos Wound Measurements Length: (cm) 0.5 Width: (cm) 2.3 Depth: (cm) 0.2 Area: (cm) 0.903 Volume: (cm) 0.181 % Reduction in Area: 0% % Reduction in Volume: 0% Epithelialization: None Tunneling: No Undermining: No Wound Description Classification: Full Thickness Without Exposed Support Structu Wound Margin: Flat and Intact Exudate Amount: Medium Exudate Type: Serosanguineous Exudate Color: red, brown res Foul Odor After Cleansing: No Slough/Fibrino No Wound Bed Granulation Amount: Large (67-100%) Exposed Structure Granulation Quality: Red, Pink Fascia Exposed: No Necrotic Amount: None Present (0%) Fat Layer (Subcutaneous Tissue) Exposed: Yes Tendon Exposed: No Muscle Exposed: No Joint Exposed: No Bone Exposed: No Treatment Notes Wound #1 (Breast) Wound Laterality: Right Cleanser Peri-Wound Care Topical Primary Dressing KerraCel Ag Gelling Fiber Dressing, 2x2 in (silver alginate) Discharge Instruction: Apply silver alginate to wound bed as instructed Secondary Dressing Bordered Gauze, 2x3.75 in Discharge Instruction: Apply over primary dressing as directed. Secured With Compression Wrap Compression Stockings Sport and exercise psychologist) Signed: 04/15/2020 12:58:14 PM By: Sandre Kitty Signed: 04/15/2020 5:54:24 PM By: Levan Hurst RN, BSN Entered By: Sandre Kitty on 04/14/2020 16:52:49 -------------------------------------------------------------------------------- Portal Details Patient Name: Date of Service: Clair Gulling D. 04/14/2020 1:15 PM Medical Record Number: [id number] Patient Account Number: [id number] Date of Birth/Sex: Treating RN: 1946-01-17 (75 y.o. Female) Levan Hurst Primary Care Gale Klar: Penni Homans Other Clinician: Referring Sherria Riemann: Treating Thane Age/Extender: Clemmie Krill Weeks in Treatment: 0 Vital Signs Time Taken: 13:14 Temperature (F): 98.3 Height (in): 62 Pulse (bpm): 72 Source: Stated Respiratory Rate (breaths/min): 16 Weight (lbs): 162 Blood Pressure (mmHg): 163/85 Source: Stated Reference Range: 80 - 120 mg / dl Body Mass Index (BMI): 29.6 Electronic Signature(s) Signed: 04/15/2020 5:54:24 PM By: Levan Hurst RN, BSN Entered By: Levan Hurst on 04/14/2020 13:15:18

## 2020-04-15 NOTE — Progress Notes (Signed)
Shannon [name] ([id number]) Visit Report for 04/14/2020 Chief Complaint Document Details Patient Name: Date of Service: Shannon [name] 04/14/2020 1:15 PM Medical Record Number: [id number] Patient Account Number: [id number] Date of Birth/Sex: Treating RN: [birth year] (75 y.o. Female) Baruch Gouty Primary Care Provider: Penni Homans Other Clinician: Referring Provider: Treating Provider/Extender: Dyanne Carrel in Treatment: 0 Information Obtained from: Patient Chief Complaint Right Breast Ulcer Electronic Signature(s) Signed: 04/14/2020 1:37:08 PM By: Worthy Keeler PA-C Entered By: Worthy Keeler on 04/14/2020 13:37:08 -------------------------------------------------------------------------------- Debridement Details Patient Name: Date of Service: Shannon [name]. 04/14/2020 1:15 PM Medical Record Number: [id number] Patient Account Number: [id number] Date of Birth/Sex: Treating RN: 01-06-1946 (75 y.o. Female) Baruch Gouty Primary Care Provider: Penni Homans Other Clinician: Referring Provider: Treating Provider/Extender: Clemmie Krill Weeks in Treatment: 0 Debridement Performed for Assessment: Wound #1 Right Breast Performed By: Clinician Baruch Gouty, RN Debridement Type: Chemical/Enzymatic/Mechanical Agent Used: saline and gauze Level of Consciousness (Pre-procedure): Awake and Alert Pre-procedure Verification/Time Out No Taken: Bleeding: None Response to Treatment: Procedure was tolerated well Level of Consciousness (Post- Awake and Alert procedure): Post Debridement Measurements of Total Wound Length: (cm) 0.5 Width: (cm) 2.3 Depth: (cm) 0.1 Volume: (cm) 0.09 Character of Wound/Ulcer Post Debridement: Requires Further Debridement Post Procedure Diagnosis Same as Pre-procedure Electronic Signature(s) Signed: 04/14/2020 6:22:43 PM By: Worthy Keeler PA-C Signed: 04/15/2020 5:44:19 PM By: Baruch Gouty RN, BSN Entered  By: Baruch Gouty on 04/14/2020 13:46:11 -------------------------------------------------------------------------------- HPI Details Patient Name: Date of Service: Shannon [name]. 04/14/2020 1:15 PM Medical Record Number: [id number] Patient Account Number: [id number] Date of Birth/Sex: Treating RN: August 29, 1945 (75 y.o. Female) Baruch Gouty Primary Care Provider: Penni Homans Other Clinician: Referring Provider: Treating Provider/Extender: Dyanne Carrel in Treatment: 0 History of Present Illness HPI Description: 04/14/2020 on evaluation today patient appears to be doing somewhat poorly in regard to the wound that is noted just inferior to her breast on the right side. She has previously had cancer where she did have an area removed with some mild treatment as far as radiation is concerned at the site. With that being said she does have a little bit of scar tissue here as well which fortunately is not too severe but nonetheless I think has led to this wound opening. Has been coming and going and she is concerned obviously that it will just go away. Fortunately there is not appear to be any signs of infection which is great news. She has been using DuoDERM on this but states it really is not staying well based on the location it tends to roll up and come off. Nonetheless she does not seem to have any major medical problems otherwise. Although again she does have a fairly strong past medical history for cancer. The good news is the wound does not appear to be too significant based on the overall appearance today but again I think we may be able to find a better dressing and the DuoDERM to help this out. Electronic Signature(s) Signed: 04/14/2020 1:54:37 PM By: Worthy Keeler PA-C Entered By: Worthy Keeler on 04/14/2020 13:54:37 -------------------------------------------------------------------------------- Physical Exam Details Patient Name: Date of Service: Shannon [name] 04/14/2020 1:15 PM Medical Record Number: [id number] Patient Account Number: [id number] Date of Birth/Sex: Treating RN: 1945-08-05 (75 y.o. Female) Baruch Gouty Primary Care Provider: Penni Homans Other Clinician: Referring Provider: Treating Provider/Extender: Clemmie Krill Weeks in Treatment: 0 Constitutional patient  is hypertensive.. pulse regular and within target range for patient.Marland Kitchen respirations regular, non-labored and within target range for patient.Marland Kitchen temperature within target range for patient.. Well-nourished and well-hydrated in no acute distress. Eyes conjunctiva clear no eyelid edema noted. pupils equal round and reactive to light and accommodation. Ears, Nose, Mouth, and Throat no gross abnormality of ear auricles or external auditory canals. normal hearing noted during conversation. mucus membranes moist. Respiratory normal breathing without difficulty. Musculoskeletal normal gait and posture. no significant deformity or arthritic changes, no loss or range of motion, no clubbing. Psychiatric this patient is able to make decisions and demonstrates good insight into disease process. Alert and Oriented x 3. pleasant and cooperative. Notes Patient's wound bed actually showed signs of fairly good epithelization. There was minimal granulation noted as well no slough or biofilm buildup currently that could be removed just with saline and gauze which I did clean this off with today. Fortunately I see again evidence of some good skin growth but again I also the patient really cannot get this to stay closed I feel like that her bra may be rubbing on this area causing some of the irritation as well which may be part of the reason why she has this kind of coming and going as she describes. Right now she has not been wearing her bra in order to help in this regard. Electronic Signature(s) Signed: 04/14/2020 1:55:20 PM By: Worthy Keeler PA-C Entered By: Worthy Keeler on 04/14/2020 13:55:20 -------------------------------------------------------------------------------- Physician Orders Details Patient Name: Date of Service: Shannon [name]. 04/14/2020 1:15 PM Medical Record Number: [id number] Patient Account Number: [id number] Date of Birth/Sex: Treating RN: [birth year] (75 y.o. Female) Baruch Gouty Primary Care Provider: Penni Homans Other Clinician: Referring Provider: Treating Provider/Extender: Dyanne Carrel in Treatment: 0 Verbal / Phone Orders: No Diagnosis Coding ICD-10 Coding Code Description [phone] Non-pressure chronic ulcer of skin of other sites with fat layer exposed Z85.9 Personal history of malignant neoplasm, unspecified Follow-up Appointments Return Appointment in 1 week. Bathing/ Shower/ Hygiene May shower and wash wound with soap and water. - with dressing changes Wound Treatment Wound #1 - Breast Wound Laterality: Right Prim Dressing: KerraCel Ag Gelling Fiber Dressing, 2x2 in (silver alginate) (DME) (Dispense As Written) Every Other Day/7 Days ary Discharge Instructions: Apply silver alginate to wound bed as instructed Secondary Dressing: Bordered Gauze, 2x3.75 in (DME) (Generic) Every Other Day/7 Days Discharge Instructions: Apply over primary dressing as directed. Electronic Signature(s) Signed: 04/14/2020 6:22:43 PM By: Worthy Keeler PA-C Signed: 04/15/2020 5:44:19 PM By: Baruch Gouty RN, BSN Entered By: Baruch Gouty on 04/14/2020 13:49:37 -------------------------------------------------------------------------------- Problem List Details Patient Name: Date of Service: Shannon [name]. 04/14/2020 1:15 PM Medical Record Number: [id number] Patient Account Number: [id number] Date of Birth/Sex: Treating RN: 02/10/1945 (75 y.o. Female) Baruch Gouty Primary Care Provider: Penni Homans Other Clinician: Referring Provider: Treating Provider/Extender: Clemmie Krill Weeks in Treatment: 0 Active Problems ICD-10 Encounter Code Description Active Date MDM Diagnosis [phone] Non-pressure chronic ulcer of skin of other sites with fat layer exposed 04/14/2020 No Yes Z85.9 Personal history of malignant neoplasm, unspecified 04/14/2020 No Yes Inactive Problems Resolved Problems Electronic Signature(s) Signed: 04/14/2020 1:36:55 PM By: Worthy Keeler PA-C Entered By: Worthy Keeler on 04/14/2020 13:36:55 -------------------------------------------------------------------------------- Progress Note Details Patient Name: Date of Service: Shannon [name]. 04/14/2020 1:15 PM Medical Record Number: [id number] Patient Account Number: [id number] Date of Birth/Sex: Treating RN: 28-Feb-1945 (75 y.o. Female) [place],  [place] Primary Care Provider: Penni Homans Other Clinician: Referring Provider: Treating Provider/Extender: Dyanne Carrel in Treatment: 0 Subjective Chief Complaint Information obtained from Patient Right Breast Ulcer History of Present Illness (HPI) 04/14/2020 on evaluation today patient appears to be doing somewhat poorly in regard to the wound that is noted just inferior to her breast on the right side. She has previously had cancer where she did have an area removed with some mild treatment as far as radiation is concerned at the site. With that being said she does have a little bit of scar tissue here as well which fortunately is not too severe but nonetheless I think has led to this wound opening. Has been coming and going and she is concerned obviously that it will just go away. Fortunately there is not appear to be any signs of infection which is great news. She has been using DuoDERM on this but states it really is not staying well based on the location it tends to roll up and come off. Nonetheless she does not seem to have any major medical problems otherwise. Although again she does have a fairly strong past medical  history for cancer. The good news is the wound does not appear to be too significant based on the overall appearance today but again I think we may be able to find a better dressing and the DuoDERM to help this out. Patient History Information obtained from Patient. Allergies Dilaudid (Reaction: itching) Family History Unknown History. Social History Never smoker, Marital Status - Married, Alcohol Use - Never, Drug Use - No History, Caffeine Use - Rarely. Medical History Eyes Patient has history of Cataracts - removed Respiratory Patient has history of Asthma Cardiovascular Patient has history of Hypertension Medical A Surgical History Notes nd Cardiovascular Cardiomyopathy Genitourinary Interstitial cystitis Oncologic Right breast cancer s/p lumpectomy 10 years ago, brain cancer 15 years ago, Melanoma 2008 Review of Systems (ROS) Constitutional Symptoms ([place]) Denies complaints or symptoms of Fatigue, Fever, Chills, Marked Weight Change. Eyes Complains or has symptoms of Glasses / Contacts - glasses. Gastrointestinal Denies complaints or symptoms of Frequent diarrhea, Nausea, Vomiting. Endocrine Denies complaints or symptoms of Heat/cold intolerance. Genitourinary Denies complaints or symptoms of Frequent urination. Integumentary (Skin) Complains or has symptoms of Wounds - wound under right breast. Musculoskeletal Denies complaints or symptoms of Muscle Pain, Muscle Weakness. Neurologic Denies complaints or symptoms of Numbness/parasthesias. Psychiatric Denies complaints or symptoms of Claustrophobia, Suicidal. Objective Constitutional patient is hypertensive.. pulse regular and within target range for patient.Marland Kitchen respirations regular, non-labored and within target range for patient.Marland Kitchen temperature within target range for patient.. Well-nourished and well-hydrated in no acute distress. Vitals Time Taken: 1:14 PM, Height: 62 in, Source: Stated, Weight: 162 lbs,  Source: Stated, BMI: 29.6, Temperature: 98.3 F, Pulse: 72 bpm, Respiratory Rate: 16 breaths/min, Blood Pressure: 163/85 mmHg. Eyes conjunctiva clear no eyelid edema noted. pupils equal round and reactive to light and accommodation. Ears, Nose, Mouth, and Throat no gross abnormality of ear auricles or external auditory canals. normal hearing noted during conversation. mucus membranes moist. Respiratory normal breathing without difficulty. Musculoskeletal normal gait and posture. no significant deformity or arthritic changes, no loss or range of motion, no clubbing. Psychiatric this patient is able to make decisions and demonstrates good insight into disease process. Alert and Oriented x 3. pleasant and cooperative. General Notes: Patient's wound bed actually showed signs of fairly good epithelization. There was minimal granulation noted as well no slough or biofilm buildup currently  that could be removed just with saline and gauze which I did clean this off with today. Fortunately I see again evidence of some good skin growth but again I also the patient really cannot get this to stay closed I feel like that her bra may be rubbing on this area causing some of the irritation as well which may be part of the reason why she has this kind of coming and going as she describes. Right now she has not been wearing her bra in order to help in this regard. Integumentary (Hair, Skin) Wound #1 status is Open. Original cause of wound was Gradually Appeared. The date acquired was: 01/31/2020. The wound is located on the Right Breast. The wound measures 0.5cm length x 2.3cm width x 0.2cm depth; 0.903cm^2 area and 0.181cm^3 volume. There is Fat Layer (Subcutaneous Tissue) exposed. There is no tunneling or undermining noted. There is a medium amount of serosanguineous drainage noted. The wound margin is flat and intact. There is large (67-100%) red, pink granulation within the wound bed. There is no necrotic  tissue within the wound bed. Assessment Active Problems ICD-10 Non-pressure chronic ulcer of skin of other sites with fat layer exposed Personal history of malignant neoplasm, unspecified Procedures Wound #1 Pre-procedure diagnosis of Wound #1 is a MASD located on the Right Breast . There was a Chemical/Enzymatic/Mechanical debridement performed by Baruch Gouty, RN.. Other agent used was saline and gauze. There was no bleeding. The procedure was tolerated well. Post Debridement Measurements: 0.5cm length x 2.3cm width x 0.1cm depth; 0.09cm^3 volume. Character of Wound/Ulcer Post Debridement requires further debridement. Post procedure Diagnosis Wound #1: Same as Pre-Procedure Plan Follow-up Appointments: Return Appointment in 1 week. Bathing/ Shower/ Hygiene: May shower and wash wound with soap and water. - with dressing changes WOUND #1: - Breast Wound Laterality: Right Prim Dressing: KerraCel Ag Gelling Fiber Dressing, 2x2 in (silver alginate) (DME) (Dispense As Written) Every Other Day/7 Days ary Discharge Instructions: Apply silver alginate to wound bed as instructed Secondary Dressing: Bordered Gauze, 2x3.75 in (DME) (Generic) Every Other Day/7 Days Discharge Instructions: Apply over primary dressing as directed. 1. Would recommend currently that we initiate treatment with a silver alginate dressing I think this is probably to be the best way to go to try to help dry things up and see if we get this to close over overall. She is in agreement with that plan. 2. We will also go ahead and utilize a border gauze dressing to cover. The biggest issue is just monitoring to make sure there is no significant irritation around the edges where the adhesive will attach obviously. Hopefully will get this closed before it causes too much irritation in that regard. 3. I really do not feel like this represents specifically any type of cancerous lesion. However if the patient does not seem to be  progressing appropriately then we may need to at least consider a biopsy however I would prefer to avoid that if at all possible. Especially if she is able to get this healed without any further intervention. We will see patient back for reevaluation in 1 week here in the clinic. If anything worsens or changes patient will contact our office for additional recommendations. Electronic Signature(s) Signed: 04/14/2020 1:56:17 PM By: Worthy Keeler PA-C Entered By: Worthy Keeler on 04/14/2020 13:56:17 -------------------------------------------------------------------------------- HxROS Details Patient Name: Date of Service: Shannon [name]. 04/14/2020 1:15 PM Medical Record Number: [id number] Patient Account Number: [id number] Date of Birth/Sex: Treating RN: 15-Nov-1945 (  75 y.o. Female) Levan Hurst Primary Care Provider: Penni Homans Other Clinician: Referring Provider: Treating Provider/Extender: Clemmie Krill Weeks in Treatment: 0 Information Obtained From Patient Constitutional Symptoms (General Health) Complaints and Symptoms: Negative for: Fatigue; Fever; Chills; Marked Weight Change Eyes Complaints and Symptoms: Positive for: Glasses / Contacts - glasses Medical History: Positive for: Cataracts - removed Gastrointestinal Complaints and Symptoms: Negative for: Frequent diarrhea; Nausea; Vomiting Endocrine Complaints and Symptoms: Negative for: Heat/cold intolerance Genitourinary Complaints and Symptoms: Negative for: Frequent urination Medical History: Past Medical History Notes: Interstitial cystitis Integumentary (Skin) Complaints and Symptoms: Positive for: Wounds - wound under right breast Musculoskeletal Complaints and Symptoms: Negative for: Muscle Pain; Muscle Weakness Neurologic Complaints and Symptoms: Negative for: Numbness/parasthesias Psychiatric Complaints and Symptoms: Negative for: Claustrophobia;  Suicidal Hematologic/Lymphatic Respiratory Medical History: Positive for: Asthma Cardiovascular Medical History: Positive for: Hypertension Past Medical History Notes: Cardiomyopathy Immunological Oncologic Medical History: Past Medical History Notes: Right breast cancer s/p lumpectomy 10 years ago, brain cancer 15 years ago, Melanoma 2008 HBO Extended History Items Eyes: Cataracts Immunizations Pneumococcal Vaccine: Received Pneumococcal Vaccination: Yes Implantable Devices None Family and Social History Unknown History: Yes; Never smoker; Marital Status - Married; Alcohol Use: Never; Drug Use: No History; Caffeine Use: Rarely; Financial Concerns: No; Food, Clothing or Shelter Needs: No; Support System Lacking: No; Transportation Concerns: No Electronic Signature(s) Signed: 04/14/2020 6:22:43 PM By: Worthy Keeler PA-C Signed: 04/15/2020 5:54:24 PM By: Levan Hurst RN, BSN Entered By: Levan Hurst on 04/14/2020 13:30:25 -------------------------------------------------------------------------------- [place] Details Patient Name: Date of Service: Shannon [name]. 04/14/2020 Medical Record Number: [id number] Patient Account Number: [id number] Date of Birth/Sex: Treating RN: 1945-07-28 (75 y.o. 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 Personal history of malignant neoplasm, unspecified Facility Procedures CPT4 Code: 10175102 Description: [place] VISIT-LEV 3 EST PT Modifier: 25 Quantity: 1 CPT4 Code: [id number] Description: 42353 - [place]BR[place] W/O ANES NON SELECT Modifier: Quantity: 1 Physician Procedures : CPT4 Code Description Modifier [id number] [place] PHYS LEVEL 3 NEW PT ICD-10 Diagnosis  Description L98.492 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

## 2020-04-15 NOTE — Progress Notes (Signed)
Shannon [name], Shannon [name] ([id number]) Visit Report for 04/14/2020 Abuse/Suicide Risk Screen Details Patient Name: Date of Service: Shannon [name], Shannon [name] 04/14/2020 1:15 PM Medical Record Number: [id number] Patient Account Number: [id number] Date of Birth/Sex: Treating RN: 1945-03-22 (75 y.o. Female) Levan Hurst Primary Care Malayzia Laforte: Penni Homans Other Clinician: Referring Tyliah Schlereth: Treating Freddie Dymek/Extender: Clemmie Krill Weeks in Treatment: 0 Abuse/Suicide Risk Screen Items Answer ABUSE RISK SCREEN: Has anyone close to you tried to hurt or harm you recentlyo No Do you feel uncomfortable with anyone in your familyo No Has anyone forced you do things that you didnt want to doo No Electronic Signature(s) Signed: 04/15/2020 5:54:24 PM By: Levan Hurst RN, BSN Entered By: Levan Hurst on 04/14/2020 13:25:14 -------------------------------------------------------------------------------- Activities of Daily Living Details Patient Name: Date of Service: Shannon [name], Shannon [name] 04/14/2020 1:15 PM Medical Record Number: [id number] Patient Account Number: [id number] Date of Birth/Sex: Treating RN: Mar 28, 1945 (74 y.o. Female) Levan Hurst Primary Care Gwynn Crossley: Penni Homans Other Clinician: Referring Melaya Hoselton: Treating Tyrisha Benninger/Extender: Clemmie Krill Weeks in Treatment: 0 Activities of Daily Living Items Answer Activities of Daily Living (Please select one for each item) Drive Automobile Completely Able T Medications ake Completely Able Use T elephone Completely Able Care for Appearance Completely Able Use T oilet Completely Able Bath / Shower Completely Able Dress Self Completely Able Feed Self Completely Able Walk Completely Able Get In / Out Bed Completely Able Housework Completely Able Prepare Meals Completely [place] for Self Completely Able Electronic Signature(s) Signed: 04/15/2020 5:54:24 PM By: Levan Hurst RN,  BSN Entered By: Levan Hurst on 04/14/2020 13:25:38 -------------------------------------------------------------------------------- Education Screening Details Patient Name: Date of Service: Shannon Gulling D. 04/14/2020 1:15 PM Medical Record Number: [id number] Patient Account Number: [id number] Date of Birth/Sex: Treating RN: June 30, 1945 (75 y.o. Female) Levan Hurst Primary Care Jansen Sciuto: Penni Homans Other Clinician: Referring Aadhira Heffernan: Treating Jermane Brayboy/Extender: Dyanne Carrel in Treatment: 0 Primary Learner Assessed: Patient Learning Preferences/Education Level/Primary Language Learning Preference: Explanation Highest Education Level: High School Preferred Language: English Cognitive Barrier Language Barrier: No Translator Needed: No Memory Deficit: No Emotional Barrier: No Cultural/Religious Beliefs Affecting Medical Care: No Physical Barrier Impaired Vision: No Impaired Hearing: No Decreased Hand dexterity: No Knowledge/Comprehension Knowledge Level: High Comprehension Level: High Ability to understand written instructions: High Ability to understand verbal instructions: High Motivation Anxiety Level: Calm Cooperation: Cooperative Education Importance: Acknowledges Need Interest in Health Problems: Asks Questions Perception: Coherent Willingness to Engage in Self-Management High Activities: Readiness to Engage in Self-Management High Activities: Electronic Signature(s) Signed: 04/15/2020 5:54:24 PM By: Levan Hurst RN, BSN Entered By: Levan Hurst on 04/14/2020 13:25:58 -------------------------------------------------------------------------------- Fall Risk Assessment Details Patient Name: Date of Service: Shannon Gulling D. 04/14/2020 1:15 PM Medical Record Number: [id number] Patient Account Number: [id number] Date of Birth/Sex: Treating RN: 10/30/1945 (75 y.o. Female) Levan Hurst Primary Care Kamariah Fruchter: Penni Homans Other  Clinician: Referring Vinny Taranto: Treating Talor Cheema/Extender: Clemmie Krill Weeks in Treatment: 0 Fall Risk Assessment Items Have you had 2 or more falls in the last 12 monthso 0 No Have you had any fall that resulted in injury in the last 12 monthso 0 No FALLS RISK SCREEN History of falling - immediate or within 3 months 0 No Secondary diagnosis (Do you have 2 or more medical diagnoseso) 15 Yes Ambulatory aid None/bed rest/wheelchair/nurse 0 Yes Crutches/cane/walker 0 No Furniture 0 No Intravenous therapy Access/Saline/Heparin Lock 0 No Gait/Transferring Normal/ bed rest/ wheelchair 0 Yes Weak (short steps  with or without shuffle, stooped but able to lift head while walking, may seek 0 No support from furniture) Impaired (short steps with shuffle, may have difficulty arising from chair, head down, impaired 0 No balance) Mental Status Oriented to own ability 0 Yes Electronic Signature(s) Signed: 04/15/2020 5:54:24 PM By: Levan Hurst RN, BSN Entered By: Levan Hurst on 04/14/2020 13:26:15 -------------------------------------------------------------------------------- Nutrition Risk Screening Details Patient Name: Date of Service: Shannon Gulling D. 04/14/2020 1:15 PM Medical Record Number: [id number] Patient Account Number: [id number] Date of Birth/Sex: Treating RN: January 13, 1946 (75 y.o. Female) Levan Hurst Primary Care Jermon Chalfant: Penni Homans Other Clinician: Referring Violetta Lavalle: Treating Calder Oblinger/Extender: Clemmie Krill Weeks in Treatment: 0 Height (in): 62 Weight (lbs): 162 Body Mass Index (BMI): 29.6 Nutrition Risk Screening Items Score Screening NUTRITION RISK SCREEN: I have an illness or condition that made me change the kind and/or amount of food I eat 0 No I eat fewer than two meals per day 0 No I eat few fruits and vegetables, or milk products 0 No I have three or more drinks of beer, liquor or wine almost every day 0 No I have  tooth or mouth problems that make it hard for me to eat 0 No I don't always have enough money to buy the food I need 0 No I eat alone most of the time 0 No I take three or more different prescribed or over-the-counter drugs a day 1 Yes Without wanting to, I have lost or gained 10 pounds in the last six months 0 No I am not always physically able to shop, cook and/or feed myself 0 No Nutrition Protocols Good Risk Protocol Moderate Risk Protocol 0 Provide education on nutrition High Risk Proctocol Risk Level: Good Risk Score: 1 Electronic Signature(s) Signed: 04/15/2020 5:54:24 PM By: Levan Hurst RN, BSN Entered By: Levan Hurst on 04/14/2020 13:26:22

## 2020-04-21 ENCOUNTER — Encounter (HOSPITAL_BASED_OUTPATIENT_CLINIC_OR_DEPARTMENT_OTHER): Payer: Medicare Other | Admitting: Physician Assistant

## 2020-04-21 ENCOUNTER — Ambulatory Visit (HOSPITAL_COMMUNITY)
Admission: RE | Admit: 2020-04-21 | Discharge: 2020-04-21 | Disposition: A | Discharge status: Home / Self Care | Payer: Medicare Other | Source: Ambulatory Visit | Attending: Student | Admitting: Student

## 2020-04-21 ENCOUNTER — Encounter (HOSPITAL_COMMUNITY)
Admission: RE | Admit: 2020-04-21 | Discharge: 2020-04-21 | Disposition: A | Discharge status: Home / Self Care | Payer: Medicare Other | Source: Ambulatory Visit | Attending: Student | Admitting: Student

## 2020-04-21 ENCOUNTER — Other Ambulatory Visit: Payer: Self-pay

## 2020-04-21 DIAGNOSIS — Z96659 Presence of unspecified artificial knee joint: Secondary | ICD-10-CM | POA: Diagnosis not present

## 2020-04-21 DIAGNOSIS — M25562 Pain in left knee: Secondary | ICD-10-CM | POA: Diagnosis not present

## 2020-04-21 DIAGNOSIS — Z96652 Presence of left artificial knee joint: Secondary | ICD-10-CM

## 2020-04-21 DIAGNOSIS — M7989 Other specified soft tissue disorders: Secondary | ICD-10-CM | POA: Diagnosis not present

## 2020-04-21 DIAGNOSIS — L98492 Non-pressure chronic ulcer of skin of other sites with fat layer exposed: Secondary | ICD-10-CM | POA: Diagnosis not present

## 2020-04-21 DIAGNOSIS — Z859 Personal history of malignant neoplasm, unspecified: Secondary | ICD-10-CM | POA: Diagnosis not present

## 2020-04-21 MED ORDER — TECHNETIUM TC 99M MEDRONATE IV KIT: 20.0000 | PACK | Freq: Once | INTRAVENOUS | Status: DC | PRN

## 2020-04-21 NOTE — Progress Notes (Addendum)
Shannon [name] ([id number]) Visit Report for 04/21/2020 Chief Complaint Document Details Patient Name: Date of Service: Shannon [name], Shannon [name] 04/21/2020 2:15 PM Medical Record Number: [id number] Patient Account Number: [id number] Date of Birth/Sex: Treating RN: 1945/07/12 (75 y.o. Shannon [name] Primary Care Provider: Penni Homans Other Clinician: Referring Provider: Treating Provider/Extender: Clemmie Krill Weeks in Treatment: 1 Information Obtained from: Patient Chief Complaint Right Breast Ulcer Electronic Signature(s) Signed: 04/21/2020 1:55:55 PM By: Worthy Keeler PA-C Entered By: Worthy Keeler on 04/21/2020 13:55:55 -------------------------------------------------------------------------------- HPI Details Patient Name: Date of Service: Shannon Gulling D. 04/21/2020 2:15 PM Medical Record Number: [id number] Patient Account Number: [id number] Date of Birth/Sex: Treating RN: 06-15-1945 (75 y.o. Shannon [name] Primary Care Provider: Penni Homans Other Clinician: Referring Provider: Treating Provider/Extender: Dyanne Carrel in Treatment: 1 History of Present Illness HPI Description: 04/14/2020 on evaluation today patient appears to be doing somewhat poorly in regard to the wound that is noted just inferior to her breast on the right side. She has previously had cancer where she did have an area removed with some mild treatment as far as radiation is concerned at the site. With that being said she does have a little bit of scar tissue here as well which fortunately is not too severe but nonetheless I think has led to this wound opening. Has been coming and going and she is concerned obviously that it will just go away. Fortunately there is not appear to be any signs of infection which is great news. She has been using DuoDERM on this but states it really is not staying well based on the location it tends to roll up and come off. Nonetheless she  does not seem to have any major medical problems otherwise. Although again she does have a fairly strong past medical history for cancer. The good news is the wound does not appear to be too significant based on the overall appearance today but again I think we may be able to find a better dressing and the DuoDERM to help this out. 04/21/2020 on evaluation today patient actually appears to be doing excellent in regard to her wound. In fact this is very tiny almost appear to be completely healed but I do believe she still has just a slight opening at this point. Electronic Signature(s) Signed: 04/21/2020 3:24:20 PM By: Worthy Keeler PA-C Entered By: Worthy Keeler on 04/21/2020 15:24:20 -------------------------------------------------------------------------------- Physical Exam Details Patient Name: Date of Service: Shannon [name] 04/21/2020 2:15 PM Medical Record Number: [id number] Patient Account Number: [id number] Date of Birth/Sex: Treating RN: 04-18-1945 (75 y.o. Shannon [name] Primary Care Provider: Penni Homans Other Clinician: Referring Provider: Treating Provider/Extender: Clemmie Krill Weeks in Treatment: 1 Constitutional Well-nourished and well-hydrated in no acute distress. Respiratory normal breathing without difficulty. Psychiatric this patient is able to make decisions and demonstrates good insight into disease process. Alert and Oriented x 3. pleasant and cooperative. Notes Upon inspection patient's wound again showed signs of good granulation epithelization at this point. There does not appear to be any evidence of infection which is great news and overall extremely pleased with where things stand today. No fevers, chills, nausea, vomiting, or diarrhea. Electronic Signature(s) Signed: 04/21/2020 3:24:35 PM By: Worthy Keeler PA-C Entered By: Worthy Keeler on 04/21/2020  15:24:35 -------------------------------------------------------------------------------- Physician Orders Details Patient Name: Date of Service: Shannon Gulling D. 04/21/2020 2:15 PM Medical Record Number: [id number] Patient Account Number: [id number]  Date of Birth/Sex: Treating RN: 15-Apr-1945 (75 y.o. 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 Bathing/ Shower/ Hygiene May shower and wash wound with soap and water. - with dressing changes Wound Treatment Wound #1 - Breast Wound Laterality: Right Prim Dressing: KerraCel Ag Gelling Fiber Dressing, 2x2 in (silver alginate) (Dispense As Written) Every Other Day/7 Days ary Discharge Instructions: Apply silver alginate to wound bed as instructed Secondary Dressing: Bordered Gauze, 2x3.75 in (Generic) Every Other Day/7 Days Discharge Instructions: Apply over primary dressing as directed. Electronic Signature(s) Signed: 04/21/2020 5:29:52 PM By: Worthy Keeler PA-C Signed: 04/21/2020 5:47:10 PM By: Baruch Gouty RN, BSN Entered By: Baruch Gouty on 04/21/2020 15:22:02 -------------------------------------------------------------------------------- Problem List Details Patient Name: Date of Service: Shannon Gulling D. 04/21/2020 2:15 PM Medical Record Number: [id number] Patient Account Number: [id number] Date of Birth/Sex: Treating RN: 16-Jan-1946 (76 y.o. Shannon [name] Primary Care Provider: Penni Homans Other Clinician: Referring Provider: Treating Provider/Extender: Clemmie Krill Weeks in Treatment: 1 Active  Problems ICD-10 Encounter Code Description Active Date MDM Diagnosis [phone] Non-pressure chronic ulcer of skin of other sites with fat layer exposed 04/14/2020 No Yes Z85.9 Personal history of malignant neoplasm, unspecified 04/14/2020 No Yes Inactive Problems Resolved Problems Electronic Signature(s) Signed: 04/21/2020 1:55:50 PM By: Worthy Keeler PA-C Entered By: Worthy Keeler on 04/21/2020 13:55:50 -------------------------------------------------------------------------------- Progress Note Details Patient Name: Date of Service: Shannon Gulling D. 04/21/2020 2:15 PM Medical Record Number: [id number] Patient Account Number: [id number] Date of Birth/Sex: Treating RN: 12-25-1945 (75 y.o. Shannon [name] Primary Care Provider: Penni Homans Other Clinician: Referring Provider: Treating Provider/Extender: Dyanne Carrel in Treatment: 1 Subjective Chief Complaint Information obtained from Patient Right Breast Ulcer History of Present Illness (HPI) 04/14/2020 on evaluation today patient appears to be doing somewhat poorly in regard to the wound that is noted just inferior to her breast on the right side. She has previously had cancer where she did have an area removed with some mild treatment as far as radiation is concerned at the site. With that being said she does have a little bit of scar tissue here as well which fortunately is not too severe but nonetheless I think has led to this wound opening. Has been coming and going and she is concerned obviously that it will just go away. Fortunately there is not appear to be any signs of infection which is great news. She has been using DuoDERM on this but states it really is not staying well based on the location it tends to roll up and come off. Nonetheless she does not seem to have any major medical problems otherwise. Although again she does have a fairly strong past medical history for cancer. The good news is the  wound does not appear to be too significant based on the overall appearance today but again I think we may be able to find a better dressing and the DuoDERM to help this out. 04/21/2020 on evaluation today patient actually appears to be doing excellent in regard to her wound. In fact this is very tiny almost appear to be completely healed but I do believe she still has just a slight  opening at this point. Objective Constitutional Well-nourished and well-hydrated in no acute distress. Vitals Time Taken: 2:36 PM, Height: 62 in, Weight: 162 lbs, BMI: 29.6, Temperature: 97.7 F, Pulse: 67 bpm, Respiratory Rate: 16 breaths/min, Blood Pressure: 168/63 mmHg. Respiratory normal breathing without difficulty. Psychiatric this patient is able to make decisions and demonstrates good insight into disease process. Alert and Oriented x 3. pleasant and cooperative. General Notes: Upon inspection patient's wound again showed signs of good granulation epithelization at this point. There does not appear to be any evidence of infection which is great news and overall extremely pleased with where things stand today. No fevers, chills, nausea, vomiting, or diarrhea. Integumentary (Hair, Skin) Wound #1 status is Open. Original cause of wound was Gradually Appeared. The date acquired was: 01/31/2020. The wound has been in treatment 1 weeks. The wound is located on the Right Breast. The wound measures 0.1cm length x 0.1cm width x 0.1cm depth; 0.008cm^2 area and 0.001cm^3 volume. There is Fat Layer (Subcutaneous Tissue) exposed. There is no tunneling or undermining noted. There is a small amount of serosanguineous drainage noted. The wound margin is flat and intact. There is large (67-100%) red granulation within the wound bed. There is no necrotic tissue within the wound bed. Assessment Active Problems ICD-10 Non-pressure chronic ulcer of skin of other sites with fat layer exposed Personal history of malignant  neoplasm, unspecified Plan Follow-up Appointments: Return Appointment in 1 week. Bathing/ Shower/ Hygiene: May shower and wash wound with soap and water. - with dressing changes WOUND #1: - Breast Wound Laterality: Right Prim Dressing: KerraCel Ag Gelling Fiber Dressing, 2x2 in (silver alginate) (Dispense As Written) Every Other Day/7 Days ary Discharge Instructions: Apply silver alginate to wound bed as instructed Secondary Dressing: Bordered Gauze, 2x3.75 in (Generic) Every Other Day/7 Days Discharge Instructions: Apply over primary dressing as directed. 1. Would recommend we continue with the silver alginate dressing I think that still the best way to go. 2. I am also can recommend that we have the patient continue to monitor for any signs of worsening or infection. Obviously I think she is actually very close to complete resolution I am hopeful she will be done by next week. We will see patient back for reevaluation in 1 week here in the clinic. If anything worsens or changes patient will contact our office for additional recommendations. Electronic Signature(s) Signed: 04/21/2020 3:25:24 PM By: Worthy Keeler PA-C Entered By: Worthy Keeler on 04/21/2020 15:25:24 -------------------------------------------------------------------------------- SuperBill Details Patient Name: Date of Service: Shannon Gulling D. 04/21/2020 Medical Record Number: [id number] Patient Account Number: [id number] Date of Birth/Sex: Treating RN: 11/14/1945 (75 y.o. 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

## 2020-04-22 NOTE — Progress Notes (Signed)
Shannon [name], Shannon [name] ([id number]) Visit Report for 04/21/2020 Arrival Information Details Patient Name: Date of Service: Shannon [name], Shannon [name] 04/21/2020 2:15 PM Medical Record Number: [id number] Patient Account Number: [id number] Date of Birth/Sex: Treating RN: 20-Jan-1946 (75 y.o. Martyn Malay, Linda Primary Care Rykar Lebleu: Penni Homans Other Clinician: Referring Antavia Tandy: Treating Kadiatou Oplinger/Extender: Dyanne Carrel in Treatment: 1 Visit Information History Since Last Visit Added or deleted any medications: No Patient Arrived: Ambulatory Any new allergies or adverse reactions: No Arrival Time: 14:35 Had a fall or experienced change in No Accompanied By: self activities of daily living that may affect Transfer Assistance: None risk of falls: Patient Requires Transmission-Based Precautions: No Signs or symptoms of abuse/neglect since last visito No Patient Has Alerts: No Hospitalized since last visit: No Implantable device outside of the clinic excluding No cellular tissue based products placed in the center since last visit: Has Dressing in Place as Prescribed: Yes Pain Present Now: No Electronic Signature(s) Signed: 04/22/2020 9:47:55 AM By: Sandre Kitty Entered By: Sandre Kitty on 04/21/2020 14:36:04 -------------------------------------------------------------------------------- Clinic Level of Care Assessment Details Patient Name: Date of Service: Shannon [name], Shannon [name] 04/21/2020 2:15 PM Medical Record Number: [id number] Patient Account Number: [id number] Date of Birth/Sex: Treating RN: [birth year] (75 y.o. Martyn Malay, Linda Primary Care Tsuruko Murtha: Penni Homans Other Clinician: Referring Nyeisha Goodall: Treating Shiane Wenberg/Extender: Dyanne Carrel in Treatment: 1 Clinic Level of Care Assessment Items TOOL 4 Quantity Score []  - 0 Use when only an EandM is performed on FOLLOW-UP visit ASSESSMENTS - Nursing Assessment / Reassessment X- 1  10 Reassessment of Co-morbidities (includes updates in patient status) X- 1 5 Reassessment of Adherence to Treatment Plan ASSESSMENTS - Wound and Skin A ssessment / Reassessment X - Simple Wound Assessment / Reassessment - one wound 1 5 []  - 0 Complex Wound Assessment / Reassessment - multiple wounds []  - 0 Dermatologic / Skin Assessment (not related to wound area) ASSESSMENTS - Focused Assessment []  - 0 Circumferential Edema Measurements - multi extremities []  - 0 Nutritional Assessment / Counseling / Intervention []  - 0 Lower Extremity Assessment (monofilament, tuning fork, pulses) []  - 0 Peripheral Arterial Disease Assessment (using hand held doppler) ASSESSMENTS - Ostomy and/or Continence Assessment and Care []  - 0 Incontinence Assessment and Management []  - 0 Ostomy Care Assessment and Management (repouching, etc.) PROCESS - Coordination of Care X - Simple Patient / Family Education for ongoing care 1 15 []  - 0 Complex (extensive) Patient / Family Education for ongoing care X- 1 10 Staff obtains Programmer, systems, Records, T Results / Process Orders est []  - 0 Staff telephones HHA, Nursing Homes / Clarify orders / etc []  - 0 Routine Transfer to another Facility (non-emergent condition) []  - 0 Routine Hospital Admission (non-emergent condition) []  - 0 New Admissions / Biomedical engineer / Ordering NPWT Apligraf, etc. , []  - 0 Emergency Hospital Admission (emergent condition) X- 1 10 Simple Discharge Coordination []  - 0 Complex (extensive) Discharge Coordination PROCESS - Special Needs []  - 0 Pediatric / Minor Patient Management []  - 0 Isolation Patient Management []  - 0 Hearing / Language / Visual special needs []  - 0 Assessment of Community assistance (transportation, D/C planning, etc.) []  - 0 Additional assistance / Altered mentation []  - 0 Support Surface(s) Assessment (bed, cushion, seat, etc.) INTERVENTIONS - Wound Cleansing / Measurement X - Simple  Wound Cleansing - one wound 1 5 []  - 0 Complex Wound Cleansing - multiple wounds X- 1 5 Wound Imaging (photographs - any number  of wounds) []  - 0 Wound Tracing (instead of photographs) X- 1 5 Simple Wound Measurement - one wound []  - 0 Complex Wound Measurement - multiple wounds INTERVENTIONS - Wound Dressings X - Small Wound Dressing one or multiple wounds 1 10 []  - 0 Medium Wound Dressing one or multiple wounds []  - 0 Large Wound Dressing one or multiple wounds X- 1 5 Application of Medications - topical []  - 0 Application of Medications - injection INTERVENTIONS - Miscellaneous []  - 0 External ear exam []  - 0 Specimen Collection (cultures, biopsies, blood, body fluids, etc.) []  - 0 Specimen(s) / Culture(s) sent or taken to Lab for analysis []  - 0 Patient Transfer (multiple staff / Civil Service fast streamer / Similar devices) []  - 0 Simple Staple / Suture removal (25 or less) []  - 0 Complex Staple / Suture removal (26 or more) []  - 0 Hypo / Hyperglycemic Management (close monitor of Blood Glucose) []  - 0 Ankle / Brachial Index (ABI) - do not check if billed separately X- 1 5 Vital Signs Has the patient been seen at the hospital within the last three years: Yes Total Score: 90 Level Of Care: New/Established - Level 3 Electronic Signature(s) Signed: 04/21/2020 5:47:10 PM By: Baruch Gouty RN, BSN Entered By: Baruch Gouty on 04/21/2020 15:22:37 -------------------------------------------------------------------------------- Encounter Discharge Information Details Patient Name: Date of Service: Shannon Gulling D. 04/21/2020 2:15 PM Medical Record Number: [id number] Patient Account Number: [id number] Date of Birth/Sex: Treating RN: May 21, 1945 (75 y.o. Tonita Phoenix, Lauren Primary Care Kinsler Soeder: Penni Homans Other Clinician: Referring Harveen Flesch: Treating Michial Disney/Extender: Dyanne Carrel in Treatment: 1 Encounter Discharge Information Items Discharge  Condition: Stable Ambulatory Status: Ambulatory Discharge Destination: Home Transportation: Private Auto Accompanied By: Jose Persia Schedule Follow-up Appointment: Yes Clinical Summary of Care: Patient Declined Electronic Signature(s) Signed: 04/21/2020 5:50:40 PM By: Rhae Hammock RN Entered By: Rhae Hammock on 04/21/2020 15:28:13 -------------------------------------------------------------------------------- Brun[place] Details Patient Name: Date of Service: Shannon Gulling D. 04/21/2020 2:15 PM Medical Record Number: [id number] Patient Account Number: [id number] Date of Birth/Sex: Treating RN: Jun 14, 1945 (75 y.o. Elam Dutch Primary Care Jackson Coffield: Penni Homans Other Clinician: Referring Kenyada Dosch: Treating Hebah Bogosian/Extender: Dyanne Carrel in Treatment: 1 [place] reviewed with physician Active Inactive Wound/Skin Impairment Nursing Diagnoses: Impaired tissue integrity Knowledge deficit related to ulceration/compromised skin integrity Goals: Patient/caregiver will verbalize understanding of skin care regimen Date Initiated: 04/14/2020 Target Resolution Date: 05/12/2020 Goal Status: Active Ulcer/skin breakdown will have a volume reduction of 30% by week 4 Date Initiated: 04/14/2020 Target Resolution Date: 05/12/2020 Goal Status: Active Interventions: Assess patient/caregiver ability to obtain necessary supplies Assess patient/caregiver ability to perform ulcer/skin care regimen upon admission and as needed Assess ulceration(s) every visit Provide education on ulcer and skin care Treatment Activities: Skin care regimen initiated : 04/14/2020 Topical wound management initiated : 04/14/2020 Notes: Electronic Signature(s) Signed: 04/21/2020 5:47:10 PM By: Baruch Gouty RN, BSN Entered By: Baruch Gouty on 04/21/2020 15:15:11 -------------------------------------------------------------------------------- Pain  Assessment Details Patient Name: Date of Service: Shannon Gulling D. 04/21/2020 2:15 PM Medical Record Number: [id number] Patient Account Number: [id number] Date of Birth/Sex: Treating RN: 07-22-1945 (75 y.o. Elam Dutch Primary Care Latoy Labriola: Penni Homans Other Clinician: Referring Alika Eppes: Treating Vergie Zahm/Extender: Clemmie Krill Weeks in Treatment: 1 Active Problems Location of Pain Severity and Description of Pain Patient Has Paino No Site Locations Pain Management and Medication Current Pain Management: Electronic Signature(s) Signed: 04/21/2020 5:47:10 PM By: Baruch Gouty RN, BSN Signed: 04/22/2020  9:47:55 AM By: Sandre Kitty Entered By: Sandre Kitty on 04/21/2020 14:36:17 -------------------------------------------------------------------------------- Patient/Caregiver Education Details Patient Name: Date of Service: Shannon [name] 3/23/2022andnbsp2:15 PM Medical Record Number: [id number] Patient Account Number: [id number] Date of Birth/Gender: Treating RN: [birth year] (75 y.o. Elam Dutch Primary Care Physician: Penni Homans Other Clinician: Referring Physician: Treating Physician/Extender: Dyanne Carrel in Treatment: 1 Education Assessment Education Provided To: Patient Education Topics Provided Wound/Skin Impairment: Methods: Explain/Verbal Responses: Reinforcements needed, State content correctly Electronic Signature(s) Signed: 04/21/2020 5:47:10 PM By: Baruch Gouty RN, BSN Entered By: Baruch Gouty on 04/21/2020 15:16:53 -------------------------------------------------------------------------------- Wound Assessment Details Patient Name: Date of Service: Shannon Gulling D. 04/21/2020 2:15 PM Medical Record Number: [id number] Patient Account Number: [id number] Date of Birth/Sex: Treating RN: 06-01-1945 (75 y.o. Martyn Malay, Linda Primary Care Fate Caster: Penni Homans Other  Clinician: Referring Marwin Primmer: Treating Tatjana Turcott/Extender: Clemmie Krill Weeks in Treatment: 1 Wound Status Wound Number: 1 Primary Etiology: MASD Wound Location: Right Breast Wound Status: Open Wounding Event: Gradually Appeared Comorbid History: Cataracts, Asthma, Hypertension Date Acquired: 01/31/2020 Weeks Of Treatment: 1 Clustered Wound: No Photos Wound Measurements Length: (cm) 0.1 Width: (cm) 0.1 Depth: (cm) 0.1 Area: (cm) 0.008 Volume: (cm) 0.001 % Reduction in Area: 99.1% % Reduction in Volume: 99.4% Epithelialization: Large (67-100%) Tunneling: No Undermining: No Wound Description Classification: Full Thickness Without Exposed Support Structures Wound Margin: Flat and Intact Exudate Amount: Small Exudate Type: Serosanguineous Exudate Color: red, brown Foul Odor After Cleansing: No Slough/Fibrino No Wound Bed Granulation Amount: Large (67-100%) Exposed Structure Granulation Quality: Red Fascia Exposed: No Necrotic Amount: None Present (0%) Fat Layer (Subcutaneous Tissue) Exposed: Yes Tendon Exposed: No Muscle Exposed: No Joint Exposed: No Bone Exposed: No Treatment Notes Wound #1 (Breast) Wound Laterality: Right Cleanser Peri-Wound Care Topical Primary Dressing KerraCel Ag Gelling Fiber Dressing, 2x2 in (silver alginate) Discharge Instruction: Apply silver alginate to wound bed as instructed Secondary Dressing Bordered Gauze, 2x3.75 in Discharge Instruction: Apply over primary dressing as directed. Secured With Compression Wrap Compression Stockings Environmental education officer) Signed: 04/21/2020 5:47:10 PM By: Baruch Gouty RN, BSN Signed: 04/22/2020 9:47:55 AM By: Sandre Kitty Entered By: Sandre Kitty on 04/21/2020 16:31:09 -------------------------------------------------------------------------------- Vitals Details Patient Name: Date of Service: Shannon Gulling D. 04/21/2020 2:15 PM Medical Record Number:  [id number] Patient Account Number: [id number] Date of Birth/Sex: Treating RN: 04-22-1945 (75 y.o. Elam Dutch Primary Care Haani Bakula: Penni Homans Other Clinician: Referring Kateryn Marasigan: Treating Niklaus Mamaril/Extender: Clemmie Krill Weeks in Treatment: 1 Vital Signs Time Taken: 14:36 Temperature (F): 97.7 Height (in): 62 Pulse (bpm): 67 Weight (lbs): 162 Respiratory Rate (breaths/min): 16 Body Mass Index (BMI): 29.6 Blood Pressure (mmHg): 168/63 Reference Range: 80 - 120 mg / dl Electronic Signature(s) Signed: 04/22/2020 9:47:55 AM By: Sandre Kitty Entered By: Sandre Kitty on 04/21/2020 14:38:16

## 2020-04-25 DIAGNOSIS — R350 Frequency of micturition: Secondary | ICD-10-CM | POA: Diagnosis not present

## 2020-04-28 ENCOUNTER — Encounter (HOSPITAL_BASED_OUTPATIENT_CLINIC_OR_DEPARTMENT_OTHER): Payer: Medicare Other | Admitting: Physician Assistant

## 2020-04-28 ENCOUNTER — Other Ambulatory Visit: Payer: Self-pay

## 2020-04-28 DIAGNOSIS — M4310 Spondylolisthesis, site unspecified: Secondary | ICD-10-CM | POA: Diagnosis not present

## 2020-04-28 DIAGNOSIS — L98492 Non-pressure chronic ulcer of skin of other sites with fat layer exposed: Secondary | ICD-10-CM | POA: Diagnosis not present

## 2020-04-28 DIAGNOSIS — Z859 Personal history of malignant neoplasm, unspecified: Secondary | ICD-10-CM | POA: Diagnosis not present

## 2020-04-28 NOTE — Progress Notes (Signed)
Shannon [name], Shannon [name] ([id number]) Visit Report for 04/28/2020 Arrival Information Details Patient Name: Date of Service: Shannon [name], Shannon [name] 04/28/2020 3:30 PM Medical Record Number: [id number] Patient Account Number: [id number] Date of Birth/Sex: Treating RN: 1945/09/19 (75 y.o. Tonita Phoenix, Lauren Primary Care Virjean Boman: Penni Homans Other Clinician: Referring Fanny Agan: Treating Akshith Moncus/Extender: Dyanne Carrel in Treatment: 2 Visit Information History Since Last Visit Added or deleted any medications: No Patient Arrived: Ambulatory Any new allergies or adverse reactions: No Arrival Time: 16:08 Had a fall or experienced change in No Accompanied By: self activities of daily living that may affect Transfer Assistance: None risk of falls: Patient Identification Verified: Yes Signs or symptoms of abuse/neglect since last visito No Secondary Verification Process Completed: Yes Hospitalized since last visit: No Patient Requires Transmission-Based Precautions: No Implantable device outside of the clinic excluding No Patient Has Alerts: No cellular tissue based products placed in the center since last visit: Has Dressing in Place as Prescribed: Yes Pain Present Now: No Electronic Signature(s) Signed: 04/28/2020 5:43:14 PM By: Rhae Hammock RN Entered By: Rhae Hammock on 04/28/2020 16:09:00 -------------------------------------------------------------------------------- Clinic Level of Care Assessment Details Patient Name: Date of Service: Shannon [name], Shannon [name] 04/28/2020 3:30 PM Medical Record Number: [id number] Patient Account Number: [id number] Date of Birth/Sex: Treating RN: [birth year] (75 y.o. Martyn Malay, Linda Primary Care Dazia Lippold: Penni Homans Other Clinician: Referring Talissa Apple: Treating Bora Broner/Extender: Dyanne Carrel in Treatment: 2 Clinic Level of Care Assessment Items TOOL 4 Quantity Score []  - 0 Use when only an EandM is  performed on FOLLOW-UP visit ASSESSMENTS - Nursing Assessment / Reassessment X- 1 10 Reassessment of Co-morbidities (includes updates in patient status) X- 1 5 Reassessment of Adherence to Treatment Plan ASSESSMENTS - Wound and Skin A ssessment / Reassessment X - Simple Wound Assessment / Reassessment - one wound 1 5 []  - 0 Complex Wound Assessment / Reassessment - multiple wounds []  - 0 Dermatologic / Skin Assessment (not related to wound area) ASSESSMENTS - Focused Assessment []  - 0 Circumferential Edema Measurements - multi extremities []  - 0 Nutritional Assessment / Counseling / Intervention []  - 0 Lower Extremity Assessment (monofilament, tuning fork, pulses) []  - 0 Peripheral Arterial Disease Assessment (using hand held doppler) ASSESSMENTS - Ostomy and/or Continence Assessment and Care []  - 0 Incontinence Assessment and Management []  - 0 Ostomy Care Assessment and Management (repouching, etc.) PROCESS - Coordination of Care X - Simple Patient / Family Education for ongoing care 1 15 []  - 0 Complex (extensive) Patient / Family Education for ongoing care X- 1 10 Staff obtains Programmer, systems, Records, T Results / Process Orders est []  - 0 Staff telephones HHA, Nursing Homes / Clarify orders / etc []  - 0 Routine Transfer to another Facility (non-emergent condition) []  - 0 Routine Hospital Admission (non-emergent condition) []  - 0 New Admissions / Biomedical engineer / Ordering NPWT Apligraf, etc. , []  - 0 Emergency Hospital Admission (emergent condition) X- 1 10 Simple Discharge Coordination []  - 0 Complex (extensive) Discharge Coordination PROCESS - Special Needs []  - 0 Pediatric / Minor Patient Management []  - 0 Isolation Patient Management []  - 0 Hearing / Language / Visual special needs []  - 0 Assessment of Community assistance (transportation, D/C planning, etc.) []  - 0 Additional assistance / Altered mentation []  - 0 Support Surface(s) Assessment  (bed, cushion, seat, etc.) INTERVENTIONS - Wound Cleansing / Measurement X - Simple Wound Cleansing - one wound 1 5 []  - 0 Complex Wound Cleansing - multiple  wounds X- 1 5 Wound Imaging (photographs - any number of wounds) []  - 0 Wound Tracing (instead of photographs) []  - 0 Simple Wound Measurement - one wound []  - 0 Complex Wound Measurement - multiple wounds INTERVENTIONS - Wound Dressings []  - 0 Small Wound Dressing one or multiple wounds []  - 0 Medium Wound Dressing one or multiple wounds []  - 0 Large Wound Dressing one or multiple wounds []  - 0 Application of Medications - topical []  - 0 Application of Medications - injection INTERVENTIONS - Miscellaneous []  - 0 External ear exam []  - 0 Specimen Collection (cultures, biopsies, blood, body fluids, etc.) []  - 0 Specimen(s) / Culture(s) sent or taken to Lab for analysis []  - 0 Patient Transfer (multiple staff / Civil Service fast streamer / Similar devices) []  - 0 Simple Staple / Suture removal (25 or less) []  - 0 Complex Staple / Suture removal (26 or more) []  - 0 Hypo / Hyperglycemic Management (close monitor of Blood Glucose) []  - 0 Ankle / Brachial Index (ABI) - do not check if billed separately X- 1 5 Vital Signs Has the patient been seen at the hospital within the last three years: Yes Total Score: 70 Level Of Care: New/Established - Level 2 Electronic Signature(s) Signed: 04/28/2020 5:50:20 PM By: Baruch Gouty RN, BSN Entered By: Baruch Gouty on 04/28/2020 16:51:20 -------------------------------------------------------------------------------- Encounter Discharge Information Details Patient Name: Date of Service: Shannon Gulling D. 04/28/2020 3:30 PM Medical Record Number: [id number] Patient Account Number: [id number] Date of Birth/Sex: Treating RN: [birth year] (75 y.o. Elam Dutch Primary Care Sherwin Hollingshed: Penni Homans Other Clinician: Referring Mirren Gest: Treating Byan Poplaski/Extender: Dyanne Carrel in Treatment: 2 Encounter Discharge Information Items Discharge Condition: Stable Ambulatory Status: Ambulatory Discharge Destination: Home Transportation: Private Auto Accompanied By: self Schedule Follow-up Appointment: Yes Clinical Summary of Care: Patient Declined Electronic Signature(s) Signed: 04/28/2020 5:50:20 PM By: Baruch Gouty RN, BSN Entered By: Baruch Gouty on 04/28/2020 16:55:41 -------------------------------------------------------------------------------- Lower Extremity Assessment Details Patient Name: Date of Service: Shannon Gulling D. 04/28/2020 3:30 PM Medical Record Number: [id number] Patient Account Number: [id number] Date of Birth/Sex: Treating RN: [birth year] (75 y.o. Tonita Phoenix, Lauren Primary Care Jasmin Trumbull: Penni Homans Other Clinician: Referring Linn Goetze: Treating Leeasia Secrist/Extender: Clemmie Krill Weeks in Treatment: 2 Electronic Signature(s) Signed: 04/28/2020 5:43:14 PM By: Rhae Hammock RN Entered By: Rhae Hammock on 04/28/2020 16:08:42 -------------------------------------------------------------------------------- Multi-Disciplinary Care Plan Details Patient Name: Date of Service: Shannon Gulling D. 04/28/2020 3:30 PM Medical Record Number: [id number] Patient Account Number: [id number] Date of Birth/Sex: Treating RN: [birth year] (75 y.o. Elam Dutch Primary Care Caterina Racine: Penni Homans Other Clinician: Referring Raeleen Winstanley: Treating Nitza Schmid/Extender: Dyanne Carrel in Treatment: 2 [place] reviewed with physician Active Inactive Electronic Signature(s) Signed: 04/28/2020 5:50:20 PM By: Baruch Gouty RN, BSN Entered By: Baruch Gouty on 04/28/2020 16:50:34 -------------------------------------------------------------------------------- Pain Assessment Details Patient Name: Date of Service: Shannon Gulling D. 04/28/2020 3:30 PM Medical Record Number:  [id number] Patient Account Number: [id number] Date of Birth/Sex: Treating RN: 10-May-1945 (75 y.o. Tonita Phoenix, Lauren Primary Care Chaise Passarella: Penni Homans Other Clinician: Referring Michai Dieppa: Treating Dorr Perrot/Extender: Clemmie Krill Weeks in Treatment: 2 Active Problems Location of Pain Severity and Description of Pain Patient Has Paino No Site Locations Pain Management and Medication Current Pain Management: Electronic Signature(s) Signed: 04/28/2020 5:43:14 PM By: Rhae Hammock RN Entered By: Rhae Hammock on 04/28/2020 16:08:35 -------------------------------------------------------------------------------- Patient/Caregiver Education Details Patient Name: Date of Service: Shannon Gulling D. 3/30/2022andnbsp3:30 PM  Medical Record Number: [id number] Patient Account Number: [id number] Date of Birth/Gender: Treating RN: Apr 19, 1945 (75 y.o. Elam Dutch Primary Care Physician: Penni Homans Other Clinician: Referring Physician: Treating Physician/Extender: Dyanne Carrel in Treatment: 2 Education Assessment Education Provided To: Patient Education Topics Provided Wound/Skin Impairment: Methods: Explain/Verbal Responses: Reinforcements needed, State content correctly Electronic Signature(s) Signed: 04/28/2020 5:50:20 PM By: Baruch Gouty RN, BSN Entered By: Baruch Gouty on 04/28/2020 16:50:48 -------------------------------------------------------------------------------- Wound Assessment Details Patient Name: Date of Service: Shannon Gulling D. 04/28/2020 3:30 PM Medical Record Number: [id number] Patient Account Number: [id number] Date of Birth/Sex: Treating RN: February 18, 1945 (75 y.o. Tonita Phoenix, Lauren Primary Care Zaron Zwiefelhofer: Penni Homans Other Clinician: Referring Elianis Fischbach: Treating Prisha Hiley/Extender: Clemmie Krill Weeks in Treatment: 2 Wound Status Wound Number: 1 Primary Etiology: MASD Wound  Location: Right Breast Wound Status: Open Wounding Event: Gradually Appeared Date Acquired: 01/31/2020 Weeks Of Treatment: 2 Clustered Wound: No Wound Measurements Length: (cm) Width: (cm) Depth: (cm) Area: (cm) Volume: (cm) 0 % Reduction in Area: 100% 0 % Reduction in Volume: 100% 0 0 0 Wound Description Classification: Full Thickness Without Exposed Support Structur es Electronic Signature(s) Signed: 04/28/2020 5:43:14 PM By: Rhae Hammock RN Entered By: Rhae Hammock on 04/28/2020 16:13:23 -------------------------------------------------------------------------------- Vitals Details Patient Name: Date of Service: Shannon Gulling D. 04/28/2020 3:30 PM Medical Record Number: [id number] Patient Account Number: [id number] Date of Birth/Sex: Treating RN: [birth year] (75 y.o. Tonita Phoenix, Lauren Primary Care Deyonna Fitzsimmons: Penni Homans Other Clinician: Referring Treyvion Durkee: Treating Etan Vasudevan/Extender: Clemmie Krill Weeks in Treatment: 2 Vital Signs Time Taken: 16:08 Temperature (F): 97.9 Height (in): 62 Pulse (bpm): 84 Weight (lbs): 162 Respiratory Rate (breaths/min): 17 Body Mass Index (BMI): 29.6 Blood Pressure (mmHg): 135/78 Reference Range: 80 - 120 mg / dl Electronic Signature(s) Signed: 04/28/2020 5:43:14 PM By: Rhae Hammock RN Entered By: Rhae Hammock on 04/28/2020 16:08:29

## 2020-04-28 NOTE — Progress Notes (Addendum)
Shannon [name], Shannon [name] ([id number]) Visit Report for 04/28/2020 Chief Complaint Document Details Patient Name: Date of Service: Shannon [name], Shannon [name] 04/28/2020 3:30 PM Medical Record Number: [id number] Patient Account Number: [id number] Date of Birth/Sex: Treating RN: [birth year] (75 y.o. Elam Dutch Primary Care Provider: Penni Homans Other Clinician: Referring Provider: Treating Provider/Extender: Clemmie Krill Weeks in Treatment: 2 Information Obtained from: Patient Chief Complaint Right Breast Ulcer Electronic Signature(s) Signed: 04/28/2020 4:01:02 PM By: Worthy Keeler PA-C Entered By: Worthy Keeler on 04/28/2020 16:01:01 -------------------------------------------------------------------------------- HPI Details Patient Name: Date of Service: Shannon Gulling D. 04/28/2020 3:30 PM Medical Record Number: [id number] Patient Account Number: [id number] Date of Birth/Sex: Treating RN: 04-15-1945 (75 y.o. Elam Dutch Primary Care Provider: Penni Homans Other Clinician: Referring Provider: Treating Provider/Extender: Dyanne Carrel in Treatment: 2 History of Present Illness HPI Description: 04/14/2020 on evaluation today patient appears to be doing somewhat poorly in regard to the wound that is noted just inferior to her breast on the right side. She has previously had cancer where she did have an area removed with some mild treatment as far as radiation is concerned at the site. With that being said she does have a little bit of scar tissue here as well which fortunately is not too severe but nonetheless I think has led to this wound opening. Has been coming and going and she is concerned obviously that it will just go away. Fortunately there is not appear to be any signs of infection which is great news. She has been using DuoDERM on this but states it really is not staying well based on the location it tends to roll up and come off. Nonetheless she  does not seem to have any major medical problems otherwise. Although again she does have a fairly strong past medical history for cancer. The good news is the wound does not appear to be too significant based on the overall appearance today but again I think we may be able to find a better dressing and the DuoDERM to help this out. 04/21/2020 on evaluation today patient actually appears to be doing excellent in regard to her wound. In fact this is very tiny almost appear to be completely healed but I do believe she still has just a slight opening at this point. 04/28/2020 upon evaluation today patient appears to be doing well with regard to her wounds. She has been tolerating the dressing changes without complication. Fortunately there is no signs of active infection at this time which is great news. In fact I feel like this is completely healed based on what I see today. Electronic Signature(s) Signed: 04/28/2020 4:56:42 PM By: Worthy Keeler PA-C Entered By: Worthy Keeler on 04/28/2020 16:56:41 -------------------------------------------------------------------------------- Physical Exam Details Patient Name: Date of Service: Shannon [name], Shannon [name] 04/28/2020 3:30 PM Medical Record Number: [id number] Patient Account Number: [id number] Date of Birth/Sex: Treating RN: 1945/05/29 (75 y.o. Elam Dutch Primary Care Provider: Penni Homans Other Clinician: Referring Provider: Treating Provider/Extender: Clemmie Krill Weeks in Treatment: 2 Constitutional Well-nourished and well-hydrated in no acute distress. Respiratory normal breathing without difficulty. Psychiatric this patient is able to make decisions and demonstrates good insight into disease process. Alert and Oriented x 3. pleasant and cooperative. Notes Patient's wound bed showed signs of good epithelization at this point. There does not appear to be any evidence of infection which is great news and overall very  pleased with where things stand  today. No fevers, chills, nausea, vomiting, or diarrhea. Electronic Signature(s) Signed: 04/28/2020 4:57:23 PM By: Worthy Keeler PA-C Entered By: Worthy Keeler on 04/28/2020 16:57:22 -------------------------------------------------------------------------------- Physician Orders Details Patient Name: Date of Service: Shannon Gulling D. 04/28/2020 3:30 PM Medical Record Number: [id number] Patient Account Number: [id number] Date of Birth/Sex: Treating RN: July 08, 1945 (75 y.o. 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 04/28/2020 3:30 PM Medical Record Number: [id number] Patient Account Number: [id number] Date of Birth/Sex: Treating RN: December 25, 1945 (75 y.o. Elam Dutch Primary Care Provider: Penni Homans Other Clinician: Referring Provider: Treating Provider/Extender: Clemmie Krill Weeks in Treatment: 2 Active Problems ICD-10 Encounter Code Description Active Date MDM Diagnosis [phone] Non-pressure chronic ulcer of skin of other sites with fat layer exposed 04/14/2020 No Yes Z85.9  Personal history of malignant neoplasm, unspecified 04/14/2020 No Yes Inactive Problems Resolved Problems Electronic Signature(s) Signed: 04/28/2020 4:00:55 PM By: Worthy Keeler PA-C Entered By: Worthy Keeler on 04/28/2020 16:00:55 -------------------------------------------------------------------------------- Progress Note Details Patient Name: Date of Service: Shannon Gulling D. 04/28/2020 3:30 PM Medical Record Number: [id number] Patient Account Number: [id number] Date of Birth/Sex: Treating RN: February 09, 1945 (75 y.o. Elam Dutch Primary Care Provider: Penni Homans Other Clinician: Referring Provider: Treating Provider/Extender: Dyanne Carrel in Treatment: 2 Subjective Chief Complaint Information obtained from Patient Right Breast Ulcer History of Present Illness (HPI) 04/14/2020 on evaluation today patient appears to be doing somewhat poorly in regard to the wound that is noted just inferior to her breast on the right side. She has previously had cancer where she did have an area removed with some mild treatment as far as radiation is concerned at the site. With that being said she does have a little bit of scar tissue here as well which fortunately is not too severe but nonetheless I think has led to this wound opening. Has been coming and going and she is concerned obviously that it will just go away. Fortunately there is not appear to be any signs of infection which is great news. She has been using DuoDERM on this but states it really is not staying well based on the location it tends to roll up and come off. Nonetheless she does not seem to have any major medical problems otherwise. Although again she does have a fairly strong past medical history for cancer. The good news is the wound does not appear to be too significant based on the overall appearance today but again I think we may be able to find a better dressing and the DuoDERM to help  this out. 04/21/2020 on evaluation today patient actually appears to be doing excellent in regard to her wound. In fact this is very tiny almost appear to be completely healed but I do believe she still has just a  slight opening at this point. 04/28/2020 upon evaluation today patient appears to be doing well with regard to her wounds. She has been tolerating the dressing changes without complication. Fortunately there is no signs of active infection at this time which is great news. In fact I feel like this is completely healed based on what I see today. Objective Constitutional Well-nourished and well-hydrated in no acute distress. Vitals Time Taken: 4:08 PM, Height: 62 in, Weight: 162 lbs, BMI: 29.6, Temperature: 97.9 F, Pulse: 84 bpm, Respiratory Rate: 17 breaths/min, Blood Pressure: 135/78 mmHg. Respiratory normal breathing without difficulty. Psychiatric this patient is able to make decisions and demonstrates good insight into disease process. Alert and Oriented x 3. pleasant and cooperative. General Notes: Patient's wound bed showed signs of good epithelization at this point. There does not appear to be any evidence of infection which is great news and overall very pleased with where things stand today. No fevers, chills, nausea, vomiting, or diarrhea. Integumentary (Hair, Skin) Wound #1 status is Open. Original cause of wound was Gradually Appeared. The date acquired was: 01/31/2020. The wound has been in treatment 2 weeks. The wound is located on the Right Breast. The wound measures 0cm length x 0cm width x 0cm depth; 0cm^2 area and 0cm^3 volume. Assessment Active Problems ICD-10 Non-pressure chronic ulcer of skin of other sites with fat layer exposed Personal history of malignant neoplasm, unspecified Plan Discharge From [facility] Services: Discharge from [place] Non Wound Condition: Apply the following to affected area as directed: - keep interdry or cloth in fold under  breast 1. At this point I think we can discontinue wound care services as the patient appears to be completely healed. 2. I am also can recommend that we have the patient go ahead and use Interdry underneath the fold under the breast in order to keep this dry I think this will be superior to powder I did give her information for how to order that today. 3. If she has any concerns or questions she knows to always let me know and again hopefully would like to see her back but if we need to we can definitely get her back in for reevaluation. Otherwise we will see her back for follow-up visit as needed. Electronic Signature(s) Signed: 04/28/2020 4:58:12 PM By: Worthy Keeler PA-C Entered By: Worthy Keeler on 04/28/2020 16:58:12 -------------------------------------------------------------------------------- SuperBill Details Patient Name: Date of Service: Shannon [name] 04/28/2020 Medical Record Number: [id number] Patient Account Number: [id number] Date of Birth/Sex: Treating RN: 10/11/1945 (75 y.o. 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

## 2020-04-30 ENCOUNTER — Other Ambulatory Visit: Payer: Self-pay

## 2020-04-30 ENCOUNTER — Ambulatory Visit
Admission: RE | Admit: 2020-04-30 | Discharge: 2020-04-30 | Disposition: A | Discharge status: Home / Self Care | Payer: Medicare Other | Source: Ambulatory Visit | Attending: Neurology | Admitting: Neurology

## 2020-04-30 DIAGNOSIS — R519 Headache, unspecified: Secondary | ICD-10-CM | POA: Diagnosis not present

## 2020-04-30 DIAGNOSIS — R413 Other amnesia: Secondary | ICD-10-CM | POA: Diagnosis not present

## 2020-04-30 DIAGNOSIS — Z85820 Personal history of malignant melanoma of skin: Secondary | ICD-10-CM

## 2020-04-30 DIAGNOSIS — G319 Degenerative disease of nervous system, unspecified: Secondary | ICD-10-CM | POA: Diagnosis not present

## 2020-04-30 DIAGNOSIS — R419 Unspecified symptoms and signs involving cognitive functions and awareness: Secondary | ICD-10-CM

## 2020-04-30 DIAGNOSIS — Z96653 Presence of artificial knee joint, bilateral: Secondary | ICD-10-CM | POA: Diagnosis not present

## 2020-04-30 MED ORDER — GADOBENATE DIMEGLUMINE 529 MG/ML IV SOLN
15.0000 mL | Freq: Once | INTRAVENOUS | Status: AC | PRN
  Administered 2020-04-30: 15 mL via INTRAVENOUS

## 2020-05-03 NOTE — Progress Notes (Signed)
Pt advised of her mri results.  Pt wanted to know what dr.Jaffe wanted to give her for the headaches?

## 2020-05-04 ENCOUNTER — Telehealth: Payer: Self-pay | Admitting: *Deleted

## 2020-05-04 ENCOUNTER — Other Ambulatory Visit: Payer: Self-pay | Admitting: Neurology

## 2020-05-04 NOTE — Telephone Encounter (Signed)
   [facility] Pre-operative Risk Assessment    Patient Name: DONALD JACQUE  DOB: Jun 23, 1945  MRN: [id number]   HEARTCARE STAFF: - Please ensure there is not already an duplicate clearance open for this procedure. - Under Visit Info/Reason for Call, type in Other and utilize the format Clearance MM/DD/YY or Clearance TBD. Do not use dashes or single digits. - If request is for dental extraction, please clarify the # of teeth to be extracted.  Request for surgical clearance:  1. What type of surgery is being performed? LEFT TOTAL KNEE ARTHROPLASTY REVISION   2. When is this surgery scheduled? 07/21/20   3. What type of clearance is required (medical clearance vs. Pharmacy clearance to hold med vs. Both)? MEDICAL  4. Are there any medications that need to be held prior to surgery and how long? ASA    5. Practice name and name of physician performing surgery? [facility]; DR. FRANK ALUISIO   6. What is the office phone number? [phone]   7.   What is the office fax number? [phone]  8.   Anesthesia type (None, local, MAC, general) ? CHOICE   Julaine Hua 05/04/2020, 1:30 PM  _________________________________________________________________   (provider comments below)  LEFT TOTAL

## 2020-05-04 NOTE — Telephone Encounter (Signed)
   Patient Name: Shannon [name]  DOB: Oct 01, 1945  MRN: [id number]   Primary Cardiologist: Kirk Ruths, MD  Chart reviewed as part of pre-operative protocol coverage. Patient was last seen by Dr. Stanford Breed in 12/2019 at which time she was doing well from a cardiac standpoint. Repeat Echo was ordered and showed LVEF of 55-60% with normal wall motion and grade 1 diastolic dysfunction. Patient was contacted today for further pre-op evaluation and reported doing well since last visit. She denies any chest pain, shortness of breath, palpitations, near syncope/syncope, or acute CHF symptoms. Activity is limited knee pain but she is still able to complete >4.0 METS without any angina. Per Revised Cardiac Risk Index, considered low risk with 0.9% chance of adverse cardiac event given history of CHF. Given past medical history and time since last visit, based on ACC/AHA guidelines, [name] would be at acceptable risk for the planned procedure without further cardiovascular testing.   We were also asked to give our recommendation for holding Aspirin. However, patient states she does not take Aspirin daily.  I will route this recommendation to the requesting party via Epic fax function and remove from pre-op pool.  Please call with questions.  Darreld Mclean, PA-C 05/04/2020, 2:44 PM

## 2020-05-06 NOTE — Progress Notes (Signed)
Pt advised of note.

## 2020-05-10 ENCOUNTER — Telehealth: Payer: Self-pay | Admitting: Neurology

## 2020-05-10 MED ORDER — DONEPEZIL HCL 10 MG PO TABS
10.0000 mg | ORAL_TABLET | Freq: Every day | ORAL | 1 refills | Status: DC
Start: 2020-05-10 — End: 2020-10-21

## 2020-05-10 NOTE — Telephone Encounter (Signed)
Patient's daughter called and said the patient is doing well on his donepezil 5 mg and is ready to go up to 10mg  but will need a new prescription.  [facility] in 90 day increments

## 2020-05-10 NOTE — Telephone Encounter (Signed)
Medication sent in. 

## 2020-05-10 NOTE — Telephone Encounter (Signed)
Please send prescription for donepezil 10mg  at bedtime with 5 refills.  Thank you

## 2020-05-17 ENCOUNTER — Encounter: Payer: Self-pay | Admitting: *Deleted

## 2020-05-28 DIAGNOSIS — J019 Acute sinusitis, unspecified: Secondary | ICD-10-CM | POA: Diagnosis not present

## 2020-06-03 ENCOUNTER — Ambulatory Visit: Payer: Medicare Other | Admitting: Neurology

## 2020-06-07 DIAGNOSIS — N3010 Interstitial cystitis (chronic) without hematuria: Secondary | ICD-10-CM | POA: Diagnosis not present

## 2020-06-08 ENCOUNTER — Other Ambulatory Visit: Payer: Self-pay | Admitting: Family Medicine

## 2020-06-08 ENCOUNTER — Other Ambulatory Visit: Payer: Self-pay | Admitting: Neurology

## 2020-06-21 ENCOUNTER — Other Ambulatory Visit: Payer: Self-pay | Admitting: Cardiology

## 2020-06-23 ENCOUNTER — Encounter: Payer: Medicare Other | Admitting: Counselor

## 2020-06-28 ENCOUNTER — Other Ambulatory Visit: Payer: Self-pay | Admitting: Cardiology

## 2020-07-01 ENCOUNTER — Encounter: Payer: Medicare Other | Admitting: Counselor

## 2020-07-05 ENCOUNTER — Ambulatory Visit (INDEPENDENT_AMBULATORY_CARE_PROVIDER_SITE_OTHER): Payer: Medicare Other | Admitting: Family Medicine

## 2020-07-05 ENCOUNTER — Ambulatory Visit: Payer: Medicare Other | Attending: Internal Medicine

## 2020-07-05 ENCOUNTER — Other Ambulatory Visit: Payer: Self-pay

## 2020-07-05 ENCOUNTER — Encounter: Payer: Self-pay | Admitting: Family Medicine

## 2020-07-05 VITALS — BP 110/72 | HR 70 | Temp 97.7°F | Resp 16 | Wt 171.0 lb

## 2020-07-05 DIAGNOSIS — F418 Other specified anxiety disorders: Secondary | ICD-10-CM | POA: Diagnosis not present

## 2020-07-05 DIAGNOSIS — Z79899 Other long term (current) drug therapy: Secondary | ICD-10-CM

## 2020-07-05 DIAGNOSIS — E782 Mixed hyperlipidemia: Secondary | ICD-10-CM

## 2020-07-05 DIAGNOSIS — I10 Essential (primary) hypertension: Secondary | ICD-10-CM | POA: Diagnosis not present

## 2020-07-05 DIAGNOSIS — M25561 Pain in right knee: Secondary | ICD-10-CM

## 2020-07-05 DIAGNOSIS — Z471 Aftercare following joint replacement surgery: Secondary | ICD-10-CM | POA: Diagnosis not present

## 2020-07-05 DIAGNOSIS — R7303 Prediabetes: Secondary | ICD-10-CM

## 2020-07-05 DIAGNOSIS — M25562 Pain in left knee: Secondary | ICD-10-CM

## 2020-07-05 DIAGNOSIS — R4189 Other symptoms and signs involving cognitive functions and awareness: Secondary | ICD-10-CM | POA: Insufficient documentation

## 2020-07-05 DIAGNOSIS — Z780 Asymptomatic menopausal state: Secondary | ICD-10-CM

## 2020-07-05 DIAGNOSIS — E2839 Other primary ovarian failure: Secondary | ICD-10-CM | POA: Diagnosis not present

## 2020-07-05 DIAGNOSIS — M810 Age-related osteoporosis without current pathological fracture: Secondary | ICD-10-CM

## 2020-07-05 DIAGNOSIS — E559 Vitamin D deficiency, unspecified: Secondary | ICD-10-CM | POA: Diagnosis not present

## 2020-07-05 DIAGNOSIS — Z23 Encounter for immunization: Secondary | ICD-10-CM

## 2020-07-05 DIAGNOSIS — Z01810 Encounter for preprocedural cardiovascular examination: Secondary | ICD-10-CM

## 2020-07-05 DIAGNOSIS — I420 Dilated cardiomyopathy: Secondary | ICD-10-CM

## 2020-07-05 DIAGNOSIS — G8929 Other chronic pain: Secondary | ICD-10-CM

## 2020-07-05 LAB — CBC
HCT: 37.2 % (ref 36.0–46.0)
Hemoglobin: 12.3 g/dL (ref 12.0–15.0)
MCHC: 33 g/dL (ref 30.0–36.0)
MCV: 89.2 fl (ref 78.0–100.0)
Platelets: 226 10*3/uL (ref 150.0–400.0)
RBC: 4.17 Mil/uL (ref 3.87–5.11)
RDW: 14.4 % (ref 11.5–15.5)
WBC: 6.9 10*3/uL (ref 4.0–10.5)

## 2020-07-05 LAB — VITAMIN D 25 HYDROXY (VIT D DEFICIENCY, FRACTURES): VITD: 26.79 ng/mL — ABNORMAL LOW (ref 30.00–100.00)

## 2020-07-05 LAB — HEMOGLOBIN A1C: Hgb A1c MFr Bld: 5.9 % (ref 4.6–6.5)

## 2020-07-05 LAB — TSH: TSH: 2.61 u[IU]/mL (ref 0.35–4.50)

## 2020-07-05 NOTE — Assessment & Plan Note (Signed)
Well controlled, no changes to meds. Encouraged heart healthy diet such as the DASH diet and exercise as tolerated.  °

## 2020-07-05 NOTE — Assessment & Plan Note (Signed)
Is following

## 2020-07-05 NOTE — Assessment & Plan Note (Addendum)
Encouraged to get adequate exercise, calcium and vitamin d intake. Repeat Dexa scan ordered 

## 2020-07-05 NOTE — Assessment & Plan Note (Signed)
Encouraged heart healthy diet, increase exercise, avoid trans fats, consider a krill oil cap daily 

## 2020-07-05 NOTE — Assessment & Plan Note (Signed)
Supplement and monitor 

## 2020-07-05 NOTE — Patient Instructions (Addendum)
Shingrix is the new shingles shot, 2 shots over 2-6 months, confirm coverage with insurance and document, then can return here for shots with nurse appt or at pharmacy.  Call Dr Georgie Chard office to reschedule your neuropsychology testing   Recommend calcium intake of 1200 to 1500 mg daily, divided into roughly 3 doses. Best source is the diet and a single dairy serving is about 500 mg, a supplement of calcium citrate once or twice daily to balance diet is fine if not getting enough in diet. Also need Vitamin D 2000 IU caps, 1 cap daily if not already taking vitamin D. Also recommend weight baring exercise on hips and upper body to keep bones strong  Acute Knee Pain, Adult Acute knee pain is sudden and may be caused by damage, swelling, or irritation of the muscles and tissues that support the knee. Pain may result from:  A fall.  An injury to the knee from twisting motions.  A hit to the knee.  Infection. Acute knee pain may go away on its own with time and rest. If it does not, your health care provider may order tests to find the cause of the pain. These may include:  Imaging tests, such as an X-ray, MRI, CT scan, or ultrasound.  Joint aspiration. In this test, fluid is removed from the knee and evaluated.  Arthroscopy. In this test, a lighted tube is inserted into the knee and an image is projected onto a TV screen.  Biopsy. In this test, a sample of tissue is removed from the body and studied under a microscope. Follow these instructions at home: If you have a knee sleeve or brace:  Wear the knee sleeve or brace as told by your health care provider. Remove it only as told by your health care provider.  Loosen it if your toes tingle, become numb, or turn cold and blue.  Keep it clean.  If the knee sleeve or brace is not waterproof: ? Do not let it get wet. ? Cover it with a watertight covering when you take a bath or shower.   Activity  Rest your knee.  Do not do things that  cause pain or make pain worse.  Avoid high-impact activities or exercises, such as running, jumping rope, or doing jumping jacks.  Work with a physical therapist to make a safe exercise program, as recommended by your health care provider. Do exercises as told by your physical therapist. Managing pain, stiffness, and swelling  If directed, put ice on the affected knee. To do this: ? If you have a removable knee sleeve or brace, remove it as told by your health care provider. ? Put ice in a plastic bag. ? Place a towel between your skin and the bag. ? Leave the ice on for 20 minutes, 2-3 times a day. ? Remove the ice if your skin turns bright red. This is very important. If you cannot feel pain, heat, or cold, you have a greater risk of damage to the area.  If directed, use an elastic bandage to put pressure (compression) on your injured knee. This may control swelling, give support, and help with discomfort.  Raise (elevate) your knee above the level of your heart while you are sitting or lying down.  Sleep with a pillow under your knee.   General instructions  Take over-the-counter and prescription medicines only as told by your health care provider.  Do not use any products that contain nicotine or tobacco, such as cigarettes,  e-cigarettes, and chewing tobacco. If you need help quitting, ask your health care provider.  If you are overweight, work with your health care provider and a dietitian to set a weight-loss goal that is healthy and reasonable for you. Extra weight can put pressure on your knee.  Pay attention to any changes in your symptoms.  Keep all follow-up visits. This is important. Contact a health care provider if:  Your knee pain continues, changes, or gets worse.  You have a fever along with knee pain.  Your knee feels warm to the touch or is red.  Your knee buckles or locks up. Get help right away if:  Your knee swells, and the swelling becomes worse.  You  cannot move your knee.  You have severe pain in your knee that cannot be managed with pain medicine. Summary  Acute knee pain can be caused by a fall, an injury, an infection, or damage, swelling, or irritation of the tissues that support your knee.  Your health care provider may perform tests to find out the cause of the pain.  Pay attention to any changes in your symptoms. Relieve your pain with rest, medicines, light activity, and the use of ice.  Get help right away if your knee swells, you cannot move your knee, or you have severe pain that cannot be managed with medicine. This information is not intended to replace advice given to you by your health care provider. Make sure you discuss any questions you have with your health care provider. Document Revised: 07/02/2019 Document Reviewed: 07/02/2019 Elsevier Patient Education  2021 [employer].

## 2020-07-05 NOTE — Assessment & Plan Note (Signed)
[id number] acceptable, minimize simple carbs. Increase exercise as tolerated.  

## 2020-07-05 NOTE — Progress Notes (Signed)
   Covid-19 Vaccination Clinic  Name:  JENINA MOENING    MRN: [id number] DOB: 04-19-1945  07/05/2020  Ms. Hruska was observed post Covid-19 immunization for 15 minutes without incident. She was provided with Vaccine Information Sheet and instruction to access the V-Safe system.   Ms. Patricelli was instructed to call 911 with any severe reactions post vaccine: Marland Kitchen Difficulty breathing  . Swelling of face and throat  . A fast heartbeat  . A bad rash all over body  . Dizziness and weakness   Immunizations Administered    Name Date Dose VIS Date Route   PFIZER Comrnaty(Gray TOP) Covid-19 Vaccine 07/05/2020 11:55 AM 0.3 mL 01/08/2020 Intramuscular   Manufacturer: [employer]   Lot: [id number]   [place]: [phone]

## 2020-07-06 ENCOUNTER — Other Ambulatory Visit (HOSPITAL_BASED_OUTPATIENT_CLINIC_OR_DEPARTMENT_OTHER): Payer: Self-pay

## 2020-07-06 DIAGNOSIS — Z23 Encounter for immunization: Secondary | ICD-10-CM | POA: Diagnosis not present

## 2020-07-06 LAB — COMPREHENSIVE METABOLIC PANEL
ALT: 17 U/L (ref 0–35)
AST: 20 U/L (ref 0–37)
Albumin: 4 g/dL (ref 3.5–5.2)
Alkaline Phosphatase: 105 U/L (ref 39–117)
BUN: 14 mg/dL (ref 6–23)
CO2: 27 mEq/L (ref 19–32)
Calcium: 9.2 mg/dL (ref 8.4–10.5)
Chloride: 109 mEq/L (ref 96–112)
Creatinine, Ser: 1.06 mg/dL (ref 0.40–1.20)
GFR: 51.18 mL/min — ABNORMAL LOW (ref >60.00)
Glucose, Bld: 108 mg/dL — ABNORMAL HIGH (ref 70–99)
Potassium: 4.5 mEq/L (ref 3.5–5.1)
Sodium: 146 mEq/L — ABNORMAL HIGH (ref 135–145)
Total Bilirubin: 0.3 mg/dL (ref 0.2–1.2)
Total Protein: 6 g/dL (ref 6.0–8.3)

## 2020-07-06 LAB — LIPID PANEL
Cholesterol: 151 mg/dL (ref 0–200)
HDL: 45.1 mg/dL (ref >39.00)
NonHDL: 105.71
Total CHOL/HDL Ratio: 3
Triglycerides: 202 mg/dL — ABNORMAL HIGH (ref 0.0–149.0)
VLDL: 40.4 mg/dL — ABNORMAL HIGH (ref 0.0–40.0)

## 2020-07-06 LAB — DRUG MONITORING, PANEL 8 WITH CONFIRMATION, URINE
6 Acetylmorphine: NEGATIVE ng/mL (ref <10)
Alcohol Metabolites: NEGATIVE ng/mL
Amphetamines: NEGATIVE ng/mL (ref <500)
Benzodiazepines: NEGATIVE ng/mL (ref <100)
Buprenorphine, Urine: NEGATIVE ng/mL (ref <5)
Cocaine Metabolite: NEGATIVE ng/mL (ref <150)
Creatinine: 49.7 mg/dL
MDMA: NEGATIVE ng/mL (ref <500)
Marijuana Metabolite: NEGATIVE ng/mL (ref <20)
Opiates: NEGATIVE ng/mL (ref <100)
Oxidant: NEGATIVE ug/mL
Oxycodone: NEGATIVE ng/mL (ref <100)
pH: 6 (ref 4.5–9.0)

## 2020-07-06 LAB — DM TEMPLATE

## 2020-07-06 LAB — LDL CHOLESTEROL, DIRECT: Direct LDL: 75 mg/dL

## 2020-07-06 MED ORDER — PFIZER-BIONT COVID-19 VAC-TRIS 30 MCG/0.3ML IM SUSP
INTRAMUSCULAR | 0 refills | Status: DC
Start: 2020-07-05 — End: 2020-07-16
  Filled 2020-07-06: qty 0.3, 1d supply, fill #0

## 2020-07-07 ENCOUNTER — Other Ambulatory Visit: Payer: Self-pay

## 2020-07-07 ENCOUNTER — Other Ambulatory Visit (INDEPENDENT_AMBULATORY_CARE_PROVIDER_SITE_OTHER): Payer: Medicare Other

## 2020-07-07 ENCOUNTER — Other Ambulatory Visit: Payer: Self-pay | Admitting: Family Medicine

## 2020-07-07 DIAGNOSIS — R350 Frequency of micturition: Secondary | ICD-10-CM

## 2020-07-07 DIAGNOSIS — R300 Dysuria: Secondary | ICD-10-CM

## 2020-07-07 DIAGNOSIS — E559 Vitamin D deficiency, unspecified: Secondary | ICD-10-CM

## 2020-07-07 DIAGNOSIS — E870 Hyperosmolality and hypernatremia: Secondary | ICD-10-CM

## 2020-07-07 MED ORDER — VITAMIN D (ERGOCALCIFEROL) 1.25 MG (50000 UNIT) PO CAPS: 50000.0000 [IU] | ORAL_CAPSULE | ORAL | 4 refills | Status: DC

## 2020-07-08 LAB — URINALYSIS, ROUTINE W REFLEX MICROSCOPIC
Bilirubin Urine: NEGATIVE
Hgb urine dipstick: NEGATIVE
Ketones, ur: NEGATIVE
Nitrite: NEGATIVE
Specific Gravity, Urine: 1.01 (ref 1.000–1.030)
Total Protein, Urine: NEGATIVE
Urine Glucose: NEGATIVE
Urobilinogen, UA: 0.2 (ref 0.0–1.0)
pH: 6 (ref 5.0–8.0)

## 2020-07-08 LAB — URINE CULTURE
MICRO NUMBER:: 11983735
SPECIMEN QUALITY:: ADEQUATE

## 2020-07-09 NOTE — Patient Instructions (Addendum)
DUE TO COVID-19 ONLY ONE VISITOR IS ALLOWED TO COME WITH YOU AND STAY IN THE WAITING ROOM ONLY DURING PRE OP AND PROCEDURE DAY OF SURGERY. THE 1 VISITOR  MAY VISIT WITH YOU AFTER SURGERY IN YOUR PRIVATE ROOM DURING VISITING HOURS ONLY!  YOU NEED TO HAVE A COVID 19 TEST ON: 07/12/20 @  2:45 PM , THIS TEST MUST BE DONE BEFORE SURGERY,  COVID TESTING SITE [place] [id number], IT IS ON THE RIGHT GOING OUT [address] APPROXIMATELY  2 MINUTES PAST [employer] ON THE RIGHT. ONCE YOUR COVID TEST IS COMPLETED,  PLEASE BEGIN THE QUARANTINE INSTRUCTIONS AS OUTLINED IN YOUR HANDOUT.                KIRIANA WORTHINGTON   Your procedure is scheduled on: 07/21/20   Report to [facility] Main  Entrance   Report to admitting at : 12:30 PM     Call this number if you have problems the morning of surgery [phone]    Remember: NO SOLID FOOD AFTER MIDNIGHT THE NIGHT PRIOR TO SURGERY. NOTHING BY MOUTH EXCEPT CLEAR LIQUIDS UNTIL: 12:00 PM . PLEASE FINISH ENSURE DRINK PER SURGEON ORDER  WHICH NEEDS TO BE COMPLETED AT : 12:00 PM.   CLEAR LIQUID DIET  Foods Allowed                                                                     Foods Excluded  Coffee and tea, regular and decaf                             liquids that you cannot  Plain Jell-O any favor except red or purple                                           see through such as: Fruit ices (not with fruit pulp)                                     milk, soups, orange juice  Iced Popsicles                                    All solid food Carbonated beverages, regular and diet                                    Cranberry, grape and apple juices Sports drinks like Gatorade Lightly seasoned clear broth or consume(fat free) Sugar, honey syrup  Sample Menu Breakfast                                Lunch  Supper Cranberry juice                    Beef broth                             Chicken broth Jell-O                                     Grape juice                           Apple juice Coffee or tea                        Jell-O                                      Popsicle                                                Coffee or tea                        Coffee or tea  _____________________________________________________________________   BRUSH YOUR TEETH MORNING OF SURGERY AND RINSE YOUR MOUTH OUT, NO CHEWING GUM CANDY OR MINTS.    Take these medicines the morning of surgery with A SIP OF WATER: cetirizine,carvedilol,famotidine.Clonazepam and gabapentin as needed.                             You may not have any metal on your body including hair pins and              piercings  Do not wear jewelry, make-up, lotions, powders or perfumes, deodorant             Do not wear nail polish on your fingernails.  Do not shave  48 hours prior to surgery.    Do not bring valuables to the hospital. [place].  Contacts, dentures or bridgework may not be worn into surgery.  Leave suitcase in the car. After surgery it may be brought to your room.     Patients discharged the day of surgery will not be allowed to drive home. IF YOU ARE HAVING SURGERY AND GOING HOME THE SAME DAY, YOU MUST HAVE AN ADULT TO DRIVE YOU HOME AND BE WITH YOU FOR 24 HOURS. YOU MAY GO HOME BY TAXI OR UBER OR ORTHERWISE, BUT AN ADULT MUST ACCOMPANY YOU HOME AND STAY WITH YOU FOR 24 HOURS.  Name and phone number of your driver:  Special Instructions: N/A              Please read over the following fact sheets you were given: _____________________________________________________________________           [facility] - Preparing for Surgery Before surgery, you can play an important role.  Because skin is not sterile, your skin needs to be as free of germs as  possible.  You can reduce the number of germs on your skin by washing with CHG (chlorahexidine  gluconate) soap before surgery.  CHG is an antiseptic cleaner which kills germs and bonds with the skin to continue killing germs even after washing. Please DO NOT use if you have an allergy to CHG or antibacterial soaps.  If your skin becomes reddened/irritated stop using the CHG and inform your nurse when you arrive at Short Stay. Do not shave (including legs and underarms) for at least 48 hours prior to the first CHG shower.  You may shave your face/neck. Please follow these instructions carefully:  1.  Shower with CHG Soap the night before surgery and the  morning of Surgery.  2.  If you choose to wash your hair, wash your hair first as usual with your  normal  shampoo.  3.  After you shampoo, rinse your hair and body thoroughly to remove the  shampoo.                           4.  Use CHG as you would any other liquid soap.  You can apply chg directly  to the skin and wash                       Gently with a scrungie or clean washcloth.  5.  Apply the CHG Soap to your body ONLY FROM THE NECK DOWN.   Do not use on face/ open                           Wound or open sores. Avoid contact with eyes, ears mouth and genitals (private parts).                       Wash face,  Genitals (private parts) with your normal soap.             6.  Wash thoroughly, paying special attention to the area where your surgery  will be performed.  7.  Thoroughly rinse your body with warm water from the neck down.  8.  DO NOT shower/wash with your normal soap after using and rinsing off  the CHG Soap.                9.  Pat yourself dry with a clean towel.            10.  Wear clean pajamas.            11.  Place clean sheets on your bed the night of your first shower and do not  sleep with pets. Day of Surgery : Do not apply any lotions/deodorants the morning of surgery.  Please wear clean clothes to the hospital/surgery center.  FAILURE TO FOLLOW THESE INSTRUCTIONS MAY RESULT IN THE CANCELLATION OF YOUR  SURGERY PATIENT SIGNATURE_________________________________  NURSE SIGNATURE__________________________________  ________________________________________________________________________   Adam Phenix  An incentive spirometer is a tool that can help keep your lungs clear and active. This tool measures how well you are filling your lungs with each breath. Taking long deep breaths may help reverse or decrease the chance of developing breathing (pulmonary) problems (especially infection) following: A long period of time when you are unable to move or be active. BEFORE THE PROCEDURE  If the spirometer includes an indicator to show your best effort, your nurse or respiratory therapist will  set it to a desired goal. If possible, sit up straight or lean slightly forward. Try not to slouch. Hold the incentive spirometer in an upright position. INSTRUCTIONS FOR USE  Sit on the edge of your bed if possible, or sit up as far as you can in bed or on a chair. Hold the incentive spirometer in an upright position. Breathe out normally. Place the mouthpiece in your mouth and seal your lips tightly around it. Breathe in slowly and as deeply as possible, raising the piston or the ball toward the top of the column. Hold your breath for 3-5 seconds or for as long as possible. Allow the piston or ball to fall to the bottom of the column. Remove the mouthpiece from your mouth and breathe out normally. Rest for a few seconds and repeat Steps 1 through 7 at least 10 times every 1-2 hours when you are awake. Take your time and take a few normal breaths between deep breaths. The spirometer may include an indicator to show your best effort. Use the indicator as a goal to work toward during each repetition. After each set of 10 deep breaths, practice coughing to be sure your lungs are clear. If you have an incision (the cut made at the time of surgery), support your incision when coughing by placing a pillow or  rolled up towels firmly against it. Once you are able to get out of bed, walk around indoors and cough well. You may stop using the incentive spirometer when instructed by your caregiver.  RISKS AND COMPLICATIONS Take your time so you do not get dizzy or light-headed. If you are in pain, you may need to take or ask for pain medication before doing incentive spirometry. It is harder to take a deep breath if you are having pain. AFTER USE Rest and breathe slowly and easily. It can be helpful to keep track of a log of your progress. Your caregiver can provide you with a simple table to help with this. If you are using the spirometer at home, follow these instructions: [place] IF:  You are having difficultly using the spirometer. You have trouble using the spirometer as often as instructed. Your pain medication is not giving enough relief while using the spirometer. You develop fever of 100.5 F (38.1 C) or higher. SEEK IMMEDIATE MEDICAL CARE IF:  You cough up bloody sputum that had not been present before. You develop fever of 102 F (38.9 C) or greater. You develop worsening pain at or near the incision site. MAKE SURE YOU:  Understand these instructions. Will watch your condition. Will get help right away if you are not doing well or get worse. Document Released: 05/29/2006 Document Revised: 04/10/2011 Document Reviewed: 07/30/2006 [facility] Patient Information 2014 [place], [place].   ________________________________________________________________________

## 2020-07-10 DIAGNOSIS — M25561 Pain in right knee: Secondary | ICD-10-CM

## 2020-07-10 HISTORY — DX: Pain in right knee: M25.561

## 2020-07-10 NOTE — Assessment & Plan Note (Signed)
Referred back to cardiology for pre op evaluation

## 2020-07-10 NOTE — Assessment & Plan Note (Signed)
She is preparing to have her right knee replaced by Dr Maureen Ralphs later this month and is anxious to proceed given her level of pain and debility. She is medically cleared for surgery pending cardiac clearance by cardiology

## 2020-07-10 NOTE — Assessment & Plan Note (Signed)
S/p left TKR 6 years with near resolution of knee pain

## 2020-07-10 NOTE — Progress Notes (Signed)
Subjective:    Patient ID: Shannon [name], female    DOB: 12/04/1945, 75 y.o.   MRN: [id number]  Chief Complaint  Patient presents with   Follow-up    HPI Patient is in today for preop clearance and follow up on chronic medical concerns. She is accompanied by her husband and they confirm her right knee pain is severe enough that it is curtailing her activity and making her unable to do what she needs to do. As a result she is anxious to proceed with total knee replacement. She denies any acute physical concerns except for fatigue and knee pain. She is noting some recent nightmares and ongoing mild short term memory difficulties. Denies CP/palp/SOB/HA/congestion/fevers/GI or GU c/o. 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   Anemia 08/28/2011   Asthma    as a child   Brain tumor ([place])    Breast cancer ([place])    right breast   Carpal tunnel syndrome of left wrist    Cataracts, bilateral 12/28/2015   Constipation 03/03/2012   DEPRESSION 08/11/2008   Dermatitis    Facial fracture ([place])    GERD 08/11/2008   H/O measles    H/O mumps    History of blood transfusion    History of chicken pox    History of hiatal hernia    Hx breast cancer, IDC, Right, 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/2009   BRCA 2 NEGATIVE, CHEMO AND RADIATION X 1 YR   HYPERLIPIDEMIA 08/11/2008   HYPERTENSION 08/11/2008   Hyponatremia 08/28/2011   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    Melanoma ([place]) 2008   DR. Ronnald Ramp   Neuropathy    NICM (nonischemic cardiomyopathy)  ([place]) 03/08/2010   likely 2/2 chemotx - a. Echo 2012: EF 45-50%;  b. Lex MV 2/12:  low risk, apical defect (small area of ischemia vs shifting breast atten);  c.  Echo 7/12: Normal wall thickness, EF 60-65%, normal wall motion, grade 1 diastolic dysfunction, mild LAE, PASP 32;   d. Lex MV 11/13:  EF 76%, no ischemia   OA (osteoarthritis) 09/07/2011   Obesity 09/28/2014   Osteoporosis    Personal history of chemotherapy 2012   Personal history of radiation therapy 2012   PREDIABETES 06/15/2009   Recurrent falls 12/28/2015   Shortness of breath    UTI (lower urinary tract infection) 03/03/2012   Vertebral fracture, osteoporotic, sequela 04/05/2016    Past Surgical History:  Procedure Laterality Date   BREAST LUMPECTOMY  04/2009   RIGHT FOR BREAST CANCER-CHEMO/RADIATION X 1 YEAR   BREAST REDUCTION SURGERY Bilateral 05/04/2014   Procedure: MAMMARY REDUCTION  (BREAST);  Surgeon: Cristine Polio, MD;  Location: [place];  Service: Plastics;  Laterality: Bilateral;   CARPAL TUNNEL RELEASE     L wrist, ulnar nerve moved   CHOLECYSTECTOMY     CRANIOTOMY FOR TUMOR  2000   ELBOW SURGERY     left   KNEE ARTHROSCOPY Left 10/12/2014   Procedure: LEFT KNEE ARTHROSCOPY ;  Surgeon: Gaynelle Arabian, MD;  Location: [facility];  Service: Orthopedics;  Laterality: Left;   KYPHOPLASTY N/A 03/30/2016   Procedure: THORACIC 12 KYPHOPLASTY;  Surgeon: Phylliss Bob, MD;  Location: [place];  Service: Orthopedics;  Laterality: N/A;  THORACIC 12 KYPHOPLASTY   LEFT HEART CATHETERIZATION WITH CORONARY ANGIOGRAM N/A 12/16/2013   Procedure: LEFT HEART CATHETERIZATION WITH CORONARY ANGIOGRAM;  Surgeon: Peter M [name], MD;  Location: [facility] C[facility];  Service: Cardiovascular;  Laterality: N/A;   LIPOMA EXCISION  03/28/2009   right leg   MELANOMA EXCISION     PORT-A-CATH REMOVAL  11/30/2010   [place]   porta cath     PORTACATH PLACEMENT  may 2011   REDUCTION MAMMAPLASTY Bilateral    SYNOVECTOMY Left 10/12/2014   Procedure:  WITH SYNOVECTOMY;  Surgeon: Gaynelle Arabian, MD;  Location: [facility];  Service: Orthopedics;  Laterality: Left;   TONSILLECTOMY  1958   TOTAL KNEE ARTHROPLASTY  02/05/2012   Procedure: TOTAL KNEE ARTHROPLASTY;  Surgeon: Gearlean Alf, MD;  Location: [facility];  Service: Orthopedics;  Laterality: Right;   TOTAL KNEE ARTHROPLASTY Left 02/10/2013   Procedure: LEFT TOTAL KNEE ARTHROPLASTY;  Surgeon: Gearlean Alf, MD;  Location: [facility];  Service: Orthopedics;  Laterality: Left;   total knee raplacement  01-2012   Right Knee   TUBAL LIGATION  1997    Family History  Problem Relation Age of Onset   Breast cancer Mother        sarcoma   Lung cancer Mother    Hypertension Mother    Prostate cancer Father    Congestive Heart Failure Father    Heart attack Father    Prostate cancer Brother    Down syndrome Son    CVA Son    Heart disease Maternal Grandfather        MI   Stomach cancer Maternal Aunt    Uterine cancer Maternal Aunt    Colon cancer Neg Hx     Social History   Socioeconomic History   Marital status: Married    Spouse name: Deidre Ala   Number of children: 3   Years of education: Not on file   Highest education level: Not on file  Occupational History   Occupation: boutique owner-retired    Employer: [employer]  Tobacco Use   Smoking status: Never   Smokeless tobacco: Never  Vaping Use   Vaping Use: Never used  Substance and Sexual Activity   Alcohol use: No   Drug use: No   Sexual activity: Not Currently  Other Topics Concern   Not on file  Social History Narrative   Patient is right-handed. She lives with her husband in a 4 story home. They take care of their grown son with down syndrome who has had a stroke. She drinks 1-2 glasses of tea in the evenings. She does not formally exercise.   Social Determinants of Health   Financial Resource Strain: Not on file  Food Insecurity: Not on file  Transportation Needs: Not on file  Physical Activity: Not on file   Stress: Not on file  Social Connections: Not on file  Intimate Partner Violence: Not on file    Outpatient Medications Prior to Visit  Medication Sig Dispense Refill   albuterol (VENTOLIN HFA) 108 (90 Base) MCG/ACT inhaler Inhale 1-2 puffs into the lungs every 6 (six) hours as needed for wheezing or shortness of breath. 18 g 3   AMBULATORY NON FORMULARY MEDICATION Nitroglycerin 0.125% gel applied topically to  the rectum TID x 6-8 weeks 1 each 1   carvedilol (COREG) 25 MG tablet TAKE 1 TABLET TWICE DAILY WITH A MEAL (Patient taking differently: Take 25 mg by mouth 2 (two) times daily with a meal.) 180 tablet 1   cetirizine (ZYRTEC) 10 MG tablet Take 10 mg by mouth daily.     clonazePAM (KLONOPIN) 0.5 MG tablet Take 0.5-1 tablets (0.25-0.5 mg total) by mouth 2 (two) times daily as needed for anxiety. 60 tablet 1   diphenhydrAMINE (BENADRYL) 25 mg capsule Take 25 mg by mouth at bedtime.     donepezil (ARICEPT) 10 MG tablet Take 1 tablet (10 mg total) by mouth at bedtime. 90 tablet 1   famotidine (PEPCID) 20 MG tablet TAKE 1 TABLET BY MOUTH TWICE A DAY (Patient taking differently: Take 20 mg by mouth 2 (two) times daily.) 180 tablet 1   ferrous sulfate 325 (65 FE) MG tablet Take 325 mg by mouth daily with breakfast.     folic acid (FOLVITE) 1 MG tablet Take 1 tablet (1 mg total) by mouth daily. (Patient taking differently: Take 1 mg by mouth at bedtime.) 90 tablet 0   gabapentin (NEURONTIN) 300 MG capsule Take 1 capsule (300 mg total) by mouth 2 (two) times daily as needed. 180 capsule 0   hydrOXYzine (ATARAX/VISTARIL) 25 MG tablet Take 25 mg by mouth at bedtime.     Multiple Vitamin (MULTIVITAMIN WITH MINERALS) TABS tablet Take 1 tablet by mouth every evening.     pentosan polysulfate (ELMIRON) 100 MG capsule Take 100 mg by mouth 2 (two) times daily.      rosuvastatin (CRESTOR) 20 MG tablet TAKE 1 TABLET AT BEDTIME (Patient taking differently: Take 20 mg by mouth at bedtime.) 90 tablet 3    telmisartan (MICARDIS) 80 MG tablet TAKE 1 TABLET EVERY DAY (Patient taking differently: Take 80 mg by mouth in the morning.) 90 tablet 3   acetaminophen (TYLENOL) 500 MG tablet Take 500-1,000 mg by mouth every 6 (six) hours as needed for moderate pain or headache.  (Patient not taking: Reported on 07/08/2020)     Ascorbic Acid (VITAMIN C PO) Take 1 tablet by mouth daily. (Patient not taking: Reported on 07/08/2020)     B Complex Vitamins (B COMPLEX PO) Take 1 tablet by mouth daily. (Patient not taking: Reported on 07/08/2020)     venlafaxine XR (EFFEXOR-XR) 75 MG 24 hr capsule Take 2 capsules (150 mg total) by mouth daily with breakfast. 180 capsule 1   aspirin EC 81 MG tablet Take 81 mg by mouth daily. (Patient not taking: Reported on 04/12/2020)     No facility-administered medications prior to visit.    Allergies  Allergen Reactions   Dilaudid [Hydromorphone Hcl] Itching    Review of Systems  Constitutional:  Positive for malaise/fatigue. Negative for fever.  HENT:  Negative for congestion.   Eyes:  Negative for blurred vision.  Respiratory:  Negative for shortness of breath.   Cardiovascular:  Negative for chest pain, palpitations and leg swelling.  Gastrointestinal:  Negative for abdominal pain, blood in stool and nausea.  Genitourinary:  Negative for dysuria and frequency.  Musculoskeletal:  Positive for joint pain. Negative for falls.  Skin:  Negative for rash.  Neurological:  Negative for dizziness, loss of consciousness and headaches.  Endo/Heme/Allergies:  Negative for environmental allergies.  Psychiatric/Behavioral:  Positive for memory loss. Negative for depression. The patient is nervous/anxious.       Objective:    Physical Exam Constitutional:  General: She is not in acute distress.    Appearance: She is well-developed.  HENT:     Head: Normocephalic and atraumatic.  Eyes:     Conjunctiva/sclera: Conjunctivae normal.  Neck:     Thyroid: No thyromegaly.   Cardiovascular:     Rate and Rhythm: Normal rate and regular rhythm.     Heart sounds: Normal heart sounds. No murmur heard. Pulmonary:     Effort: Pulmonary effort is normal. No respiratory distress.     Breath sounds: Normal breath sounds.  Abdominal:     General: Bowel sounds are normal. There is no distension.     Palpations: Abdomen is soft. There is no mass.     Tenderness: There is no abdominal tenderness.  Musculoskeletal:     Cervical back: Neck supple.  Lymphadenopathy:     Cervical: No cervical adenopathy.  Skin:    General: Skin is warm and dry.  Neurological:     Mental Status: She is alert and oriented to person, place, and time.  Psychiatric:        Behavior: Behavior normal.    BP 110/72   Pulse 70   Temp 97.7 F (36.5 C)   Resp 16   Wt 171 lb (77.6 kg)   LMP 01/30/1994   SpO2 95%   BMI 33.40 kg/m  Wt Readings from Last 3 Encounters:  07/05/20 171 lb (77.6 kg)  04/12/20 175 lb (79.4 kg)  03/25/20 164 lb (74.4 kg)    Diabetic Foot Exam - Simple   No data filed    Lab Results  Component Value Date   WBC 6.9 07/05/2020   HGB 12.3 07/05/2020   HCT 37.2 07/05/2020   PLT 226.0 07/05/2020   GLUCOSE 108 (H) 07/05/2020   CHOL 151 07/05/2020   TRIG 202.0 (H) 07/05/2020   HDL 45.10 07/05/2020   LDLDIRECT 75.0 07/05/2020   LDLCALC 80 06/05/2018   ALT 17 07/05/2020   AST 20 07/05/2020   NA 146 (H) 07/05/2020   K 4.5 07/05/2020   CL 109 07/05/2020   CREATININE 1.06 07/05/2020   BUN 14 07/05/2020   CO2 27 07/05/2020   TSH 2.61 07/05/2020   INR 1.06 03/30/2016   HGBA1C 5.9 07/05/2020    Lab Results  Component Value Date   TSH 2.61 07/05/2020   Lab Results  Component Value Date   WBC 6.9 07/05/2020   HGB 12.3 07/05/2020   HCT 37.2 07/05/2020   MCV 89.2 07/05/2020   PLT 226.0 07/05/2020   Lab Results  Component Value Date   NA 146 (H) 07/05/2020   K 4.5 07/05/2020   CHLORIDE 107 08/07/2016   CO2 27 07/05/2020   GLUCOSE 108 (H)  07/05/2020   BUN 14 07/05/2020   CREATININE 1.06 07/05/2020   BILITOT 0.3 07/05/2020   ALKPHOS 105 07/05/2020   AST 20 07/05/2020   ALT 17 07/05/2020   PROT 6.0 07/05/2020   ALBUMIN 4.0 07/05/2020   CALCIUM 9.2 07/05/2020   ANIONGAP 11 02/18/2020   EGFR 72 (L) 08/07/2016   GFR 51.18 (L) 07/05/2020   Lab Results  Component Value Date   CHOL 151 07/05/2020   Lab Results  Component Value Date   HDL 45.10 07/05/2020   Lab Results  Component Value Date   LDLCALC 80 06/05/2018   Lab Results  Component Value Date   TRIG 202.0 (H) 07/05/2020   Lab Results  Component Value Date   CHOLHDL 3 07/05/2020   Lab Results  Component Value Date   HGBA1C 5.9 07/05/2020       Assessment & Plan:   Problem List Items Addressed This Visit     Hyperlipidemia, mixed    Encouraged heart healthy diet, increase exercise, avoid trans fats, consider a krill oil cap daily       Relevant Orders   Lipid panel (Completed)   Depression with anxiety   Relevant Orders   DRUG MONITORING, PANEL 8 WITH CONFIRMATION, URINE (Completed)   Essential hypertension    Well controlled, no changes to meds. Encouraged heart healthy diet such as the DASH diet and exercise as tolerated.        Relevant Orders   CBC (Completed)   Comprehensive metabolic panel (Completed)   TSH (Completed)   Osteoporosis    Encouraged to get adequate exercise, calcium and vitamin d intake. Repeat Dexa scan ordered       Prediabetes    hgba1c acceptable, minimize simple carbs. Increase exercise as tolerated.        Relevant Orders   Hemoglobin A1c (Completed)   Preop cardiovascular exam    Referred back to cardiology for pre op evaluation       Congestive dilated cardiomyopathy ([place])   Relevant Orders   Ambulatory referral to Cardiology   Vitamin D deficiency    Supplement and monitor       Relevant Orders   VITAMIN D 25 Hydroxy (Vit-D Deficiency, Fractures) (Completed)   Left knee pain    S/p  left TKR 6 years with near resolution of knee pain       Cognitive decline    Is following       Right knee pain    She is preparing to have her right knee replaced by Dr Maureen Ralphs later this month and is anxious to proceed given her level of pain and debility. She is medically cleared for surgery pending cardiac clearance by cardiology       Other Visit Diagnoses     Post-menopausal    -  Primary   Relevant Orders   DG Bone Density   Estrogen deficiency       Relevant Orders   DG Bone Density   High risk medication use       Relevant Orders   DRUG MONITORING, PANEL 8 WITH CONFIRMATION, URINE (Completed)       I have discontinued Alycia Patten. Hey's aspirin EC. I am also having her maintain her hydrOXYzine, pentosan polysulfate, ferrous sulfate, multivitamin with minerals, gabapentin, albuterol, rosuvastatin, folic acid, clonazePAM, AMBULATORY NON FORMULARY MEDICATION, donepezil, famotidine, telmisartan, carvedilol, cetirizine, and diphenhydrAMINE.  No orders of the defined types were placed in this encounter.    Penni Homans, MD

## 2020-07-12 ENCOUNTER — Other Ambulatory Visit: Payer: Self-pay

## 2020-07-12 ENCOUNTER — Encounter (HOSPITAL_COMMUNITY)
Admission: RE | Admit: 2020-07-12 | Discharge: 2020-07-12 | Disposition: A | Discharge status: Home / Self Care | Payer: Medicare Other | Source: Ambulatory Visit | Attending: Orthopedic Surgery | Admitting: Orthopedic Surgery

## 2020-07-12 ENCOUNTER — Encounter (HOSPITAL_COMMUNITY): Payer: Self-pay

## 2020-07-12 ENCOUNTER — Other Ambulatory Visit (HOSPITAL_COMMUNITY)
Admission: RE | Admit: 2020-07-12 | Discharge: 2020-07-12 | Disposition: A | Discharge status: Home / Self Care | Payer: Medicare Other | Source: Ambulatory Visit | Attending: Orthopedic Surgery | Admitting: Orthopedic Surgery

## 2020-07-12 DIAGNOSIS — Z01812 Encounter for preprocedural laboratory examination: Secondary | ICD-10-CM | POA: Insufficient documentation

## 2020-07-12 DIAGNOSIS — Z20822 Contact with and (suspected) exposure to covid-19: Secondary | ICD-10-CM | POA: Insufficient documentation

## 2020-07-12 LAB — SURGICAL PCR SCREEN
MRSA, PCR: NEGATIVE
Staphylococcus aureus: NEGATIVE

## 2020-07-12 LAB — SARS CORONAVIRUS 2 (TAT 6-24 HRS): SARS Coronavirus 2: NEGATIVE

## 2020-07-12 LAB — PROTIME-INR
INR: 1 (ref 0.8–1.2)
Prothrombin Time: 13.7 seconds (ref 11.4–15.2)

## 2020-07-12 NOTE — Progress Notes (Signed)
COVID Vaccine Completed: Yes Date COVID Vaccine completed: 07/05/20 [place] COVID vaccine manufacturer: Pfizer    PCP - Dr. Penni Homans. Cardiologist - Dr. Sherryll Burger. LOV: 12/2019 Clearance: Darreld Mclean: PAC: 05/04/20: Epic.  Chest x-ray - 03/11/20 EKG - 10/18/20 EPIC Stress Test -  ECHO - 01/12/20 Cardiac Cath -  Pacemaker/ICD device last checked: A!C: 5.9 @ 07/05/20 Sleep Study -  CPAP -   Fasting Blood Sugar -  Checks Blood Sugar _____ times a day  Blood Thinner Instructions: Aspirin Instructions: Last Dose:  Anesthesia review: Hx: DIA,Pre-DIA.  Patient denies shortness of breath, fever, cough and chest pain at PAT appointment   Patient verbalized understanding of instructions that were given to them at the PAT appointment. Patient was also instructed that they will need to review over the PAT instructions again at home before surgery.

## 2020-07-13 MED ORDER — BUPIVACAINE LIPOSOME 1.3 % IJ SUSP
20.0000 mL | Freq: Once | INTRAMUSCULAR | Status: DC
  Filled 2020-07-13: qty 20

## 2020-07-14 ENCOUNTER — Other Ambulatory Visit: Payer: Medicare Other

## 2020-07-14 ENCOUNTER — Inpatient Hospital Stay (HOSPITAL_COMMUNITY): Payer: Medicare Other | Admitting: Certified Registered Nurse Anesthetist

## 2020-07-14 ENCOUNTER — Encounter: Payer: Self-pay | Admitting: *Deleted

## 2020-07-14 ENCOUNTER — Encounter (HOSPITAL_COMMUNITY)
Admission: RE | Disposition: A | Discharge status: Home / Self Care | Payer: Self-pay | Source: Home / Self Care | Attending: Orthopedic Surgery

## 2020-07-14 ENCOUNTER — Inpatient Hospital Stay (HOSPITAL_COMMUNITY)
Admission: RE | Admit: 2020-07-14 | Discharge: 2020-07-16 | DRG: 467 | Disposition: A | Discharge status: Home Health | Payer: Medicare Other | Attending: Orthopedic Surgery | Admitting: Orthopedic Surgery

## 2020-07-14 ENCOUNTER — Encounter (HOSPITAL_COMMUNITY): Payer: Self-pay | Admitting: Orthopedic Surgery

## 2020-07-14 DIAGNOSIS — Y792 Prosthetic and other implants, materials and accessory orthopedic devices associated with adverse incidents: Secondary | ICD-10-CM | POA: Diagnosis present

## 2020-07-14 DIAGNOSIS — Z85820 Personal history of malignant melanoma of skin: Secondary | ICD-10-CM | POA: Diagnosis not present

## 2020-07-14 DIAGNOSIS — I420 Dilated cardiomyopathy: Secondary | ICD-10-CM | POA: Diagnosis present

## 2020-07-14 DIAGNOSIS — G629 Polyneuropathy, unspecified: Secondary | ICD-10-CM | POA: Diagnosis present

## 2020-07-14 DIAGNOSIS — Z8249 Family history of ischemic heart disease and other diseases of the circulatory system: Secondary | ICD-10-CM | POA: Diagnosis not present

## 2020-07-14 DIAGNOSIS — Z96651 Presence of right artificial knee joint: Secondary | ICD-10-CM | POA: Diagnosis present

## 2020-07-14 DIAGNOSIS — Z801 Family history of malignant neoplasm of trachea, bronchus and lung: Secondary | ICD-10-CM | POA: Diagnosis not present

## 2020-07-14 DIAGNOSIS — F418 Other specified anxiety disorders: Secondary | ICD-10-CM | POA: Diagnosis not present

## 2020-07-14 DIAGNOSIS — T84093A Other mechanical complication of internal left knee prosthesis, initial encounter: Secondary | ICD-10-CM | POA: Diagnosis not present

## 2020-07-14 DIAGNOSIS — Z8049 Family history of malignant neoplasm of other genital organs: Secondary | ICD-10-CM | POA: Diagnosis not present

## 2020-07-14 DIAGNOSIS — Z96652 Presence of left artificial knee joint: Secondary | ICD-10-CM | POA: Diagnosis not present

## 2020-07-14 DIAGNOSIS — Z800 Family history of malignant neoplasm of digestive organs: Secondary | ICD-10-CM | POA: Diagnosis not present

## 2020-07-14 DIAGNOSIS — Z853 Personal history of malignant neoplasm of breast: Secondary | ICD-10-CM | POA: Diagnosis not present

## 2020-07-14 DIAGNOSIS — R451 Restlessness and agitation: Secondary | ICD-10-CM | POA: Diagnosis not present

## 2020-07-14 DIAGNOSIS — M810 Age-related osteoporosis without current pathological fracture: Secondary | ICD-10-CM | POA: Diagnosis not present

## 2020-07-14 DIAGNOSIS — Z20822 Contact with and (suspected) exposure to covid-19: Secondary | ICD-10-CM | POA: Diagnosis not present

## 2020-07-14 DIAGNOSIS — Z823 Family history of stroke: Secondary | ICD-10-CM | POA: Diagnosis not present

## 2020-07-14 DIAGNOSIS — E782 Mixed hyperlipidemia: Secondary | ICD-10-CM | POA: Diagnosis present

## 2020-07-14 DIAGNOSIS — Z8042 Family history of malignant neoplasm of prostate: Secondary | ICD-10-CM

## 2020-07-14 DIAGNOSIS — Z803 Family history of malignant neoplasm of breast: Secondary | ICD-10-CM

## 2020-07-14 DIAGNOSIS — I10 Essential (primary) hypertension: Secondary | ICD-10-CM | POA: Diagnosis not present

## 2020-07-14 DIAGNOSIS — T84033A Mechanical loosening of internal left knee prosthetic joint, initial encounter: Secondary | ICD-10-CM | POA: Diagnosis not present

## 2020-07-14 DIAGNOSIS — F32A Depression, unspecified: Secondary | ICD-10-CM | POA: Diagnosis not present

## 2020-07-14 DIAGNOSIS — Z923 Personal history of irradiation: Secondary | ICD-10-CM

## 2020-07-14 DIAGNOSIS — M1990 Unspecified osteoarthritis, unspecified site: Secondary | ICD-10-CM | POA: Diagnosis not present

## 2020-07-14 DIAGNOSIS — E559 Vitamin D deficiency, unspecified: Secondary | ICD-10-CM | POA: Diagnosis present

## 2020-07-14 DIAGNOSIS — T84013A Broken internal left knee prosthesis, initial encounter: Secondary | ICD-10-CM | POA: Diagnosis not present

## 2020-07-14 DIAGNOSIS — Z9221 Personal history of antineoplastic chemotherapy: Secondary | ICD-10-CM

## 2020-07-14 DIAGNOSIS — K219 Gastro-esophageal reflux disease without esophagitis: Secondary | ICD-10-CM | POA: Diagnosis present

## 2020-07-14 DIAGNOSIS — Z8279 Family history of other congenital malformations, deformations and chromosomal abnormalities: Secondary | ICD-10-CM

## 2020-07-14 DIAGNOSIS — D649 Anemia, unspecified: Secondary | ICD-10-CM | POA: Diagnosis present

## 2020-07-14 DIAGNOSIS — F05 Delirium due to known physiological condition: Secondary | ICD-10-CM | POA: Diagnosis not present

## 2020-07-14 DIAGNOSIS — G8918 Other acute postprocedural pain: Secondary | ICD-10-CM | POA: Diagnosis not present

## 2020-07-14 HISTORY — PX: TOTAL KNEE REVISION: SHX996

## 2020-07-14 HISTORY — DX: Other mechanical complication of internal left knee prosthesis, initial encounter: T84.093A

## 2020-07-14 LAB — TYPE AND SCREEN
ABO/RH(D): O POS
Antibody Screen: NEGATIVE

## 2020-07-14 SURGERY — TOTAL KNEE REVISION
Anesthesia: Spinal | Site: Knee | Laterality: Left

## 2020-07-14 MED ORDER — METOCLOPRAMIDE HCL 5 MG/ML IJ SOLN: 5.0000 mg | Freq: Three times a day (TID) | INTRAMUSCULAR | Status: DC | PRN

## 2020-07-14 MED ORDER — MORPHINE SULFATE (PF) 4 MG/ML IV SOLN
0.5000 mg | INTRAVENOUS | Status: DC | PRN
  Administered 2020-07-15: 1 mg via INTRAVENOUS
  Filled 2020-07-14: qty 1

## 2020-07-14 MED ORDER — IRBESARTAN 300 MG PO TABS
300.0000 mg | ORAL_TABLET | Freq: Every day | ORAL | Status: DC
  Administered 2020-07-15 – 2020-07-16 (×2): 300 mg via ORAL
  Filled 2020-07-14 (×2): qty 1
  Filled 2020-07-14 (×2): qty 2

## 2020-07-14 MED ORDER — OXYCODONE HCL 5 MG PO TABS
5.0000 mg | ORAL_TABLET | ORAL | Status: DC | PRN
  Administered 2020-07-16: 5 mg via ORAL
  Filled 2020-07-14: qty 1

## 2020-07-14 MED ORDER — PENTOSAN POLYSULFATE SODIUM 100 MG PO CAPS: 100.0000 mg | ORAL_CAPSULE | Freq: Three times a day (TID) | ORAL | Status: DC

## 2020-07-14 MED ORDER — SODIUM CHLORIDE (PF) 0.9 % IJ SOLN
INTRAMUSCULAR | Status: DC | PRN
  Administered 2020-07-14: 10 mL via INTRAVENOUS

## 2020-07-14 MED ORDER — FENTANYL CITRATE (PF) 100 MCG/2ML IJ SOLN
50.0000 ug | INTRAMUSCULAR | Status: DC
  Administered 2020-07-14: 50 ug via INTRAVENOUS
  Filled 2020-07-14: qty 2

## 2020-07-14 MED ORDER — FERROUS SULFATE 325 (65 FE) MG PO TABS
325.0000 mg | ORAL_TABLET | Freq: Every day | ORAL | Status: DC
  Administered 2020-07-15 – 2020-07-16 (×2): 325 mg via ORAL
  Filled 2020-07-14 (×2): qty 1

## 2020-07-14 MED ORDER — OXYCODONE HCL 5 MG PO TABS: 5.0000 mg | ORAL_TABLET | Freq: Once | ORAL | Status: AC | PRN

## 2020-07-14 MED ORDER — FLEET ENEMA 7-19 GM/118ML RE ENEM: 1.0000 | ENEMA | Freq: Once | RECTAL | Status: DC | PRN

## 2020-07-14 MED ORDER — VENLAFAXINE HCL ER 150 MG PO CP24
150.0000 mg | ORAL_CAPSULE | Freq: Every day | ORAL | Status: DC
  Administered 2020-07-15 – 2020-07-16 (×2): 150 mg via ORAL
  Filled 2020-07-14 (×2): qty 1

## 2020-07-14 MED ORDER — ALBUTEROL SULFATE (2.5 MG/3ML) 0.083% IN NEBU: 2.5000 mg | INHALATION_SOLUTION | Freq: Four times a day (QID) | RESPIRATORY_TRACT | Status: DC | PRN

## 2020-07-14 MED ORDER — LIDOCAINE HCL (CARDIAC) PF 100 MG/5ML IV SOSY
PREFILLED_SYRINGE | INTRAVENOUS | Status: DC | PRN
  Administered 2020-07-14: 60 mg via INTRAVENOUS
  Administered 2020-07-14: 20 mg via INTRAVENOUS

## 2020-07-14 MED ORDER — DIPHENHYDRAMINE HCL 12.5 MG/5ML PO ELIX
12.5000 mg | ORAL_SOLUTION | ORAL | Status: DC | PRN
  Administered 2020-07-16: 25 mg via ORAL
  Filled 2020-07-14: qty 10

## 2020-07-14 MED ORDER — OXYCODONE HCL 5 MG/5ML PO SOLN: 5.0000 mg | Freq: Once | ORAL | Status: AC | PRN

## 2020-07-14 MED ORDER — TRANEXAMIC ACID-NACL 1000-0.7 MG/100ML-% IV SOLN
1000.0000 mg | INTRAVENOUS | Status: AC
  Administered 2020-07-14: 1000 mg via INTRAVENOUS
  Filled 2020-07-14: qty 100

## 2020-07-14 MED ORDER — CHLORHEXIDINE GLUCONATE 0.12 % MT SOLN
15.0000 mL | Freq: Once | OROMUCOSAL | Status: AC
  Administered 2020-07-14: 15 mL via OROMUCOSAL

## 2020-07-14 MED ORDER — PROPOFOL 500 MG/50ML IV EMUL
INTRAVENOUS | Status: DC | PRN
  Administered 2020-07-14: 100 ug/kg/min via INTRAVENOUS

## 2020-07-14 MED ORDER — METHOCARBAMOL 500 MG PO TABS
500.0000 mg | ORAL_TABLET | Freq: Four times a day (QID) | ORAL | Status: DC | PRN
  Administered 2020-07-14 – 2020-07-16 (×4): 500 mg via ORAL
  Filled 2020-07-14 (×4): qty 1

## 2020-07-14 MED ORDER — BUPIVACAINE LIPOSOME 1.3 % IJ SUSP
INTRAMUSCULAR | Status: DC | PRN
  Administered 2020-07-14: 20 mL

## 2020-07-14 MED ORDER — CLONAZEPAM 0.5 MG PO TABS
0.2500 mg | ORAL_TABLET | Freq: Two times a day (BID) | ORAL | Status: DC | PRN
  Administered 2020-07-15 – 2020-07-16 (×3): 0.5 mg via ORAL
  Filled 2020-07-14 (×3): qty 1

## 2020-07-14 MED ORDER — DEXAMETHASONE SODIUM PHOSPHATE 10 MG/ML IJ SOLN
INTRAMUSCULAR | Status: AC
  Filled 2020-07-14: qty 1

## 2020-07-14 MED ORDER — POLYETHYLENE GLYCOL 3350 17 G PO PACK: 17.0000 g | PACK | Freq: Every day | ORAL | Status: DC | PRN

## 2020-07-14 MED ORDER — GLYCOPYRROLATE 0.2 MG/ML IJ SOLN
INTRAMUSCULAR | Status: DC | PRN
  Administered 2020-07-14: .1 mg via INTRAVENOUS

## 2020-07-14 MED ORDER — ORAL CARE MOUTH RINSE: 15.0000 mL | Freq: Once | OROMUCOSAL | Status: AC

## 2020-07-14 MED ORDER — CEFAZOLIN SODIUM-DEXTROSE 2-4 GM/100ML-% IV SOLN
2.0000 g | INTRAVENOUS | Status: AC
  Administered 2020-07-14: 2 g via INTRAVENOUS
  Filled 2020-07-14: qty 100

## 2020-07-14 MED ORDER — MIDAZOLAM HCL 2 MG/2ML IJ SOLN
1.0000 mg | INTRAMUSCULAR | Status: DC
  Filled 2020-07-14: qty 2

## 2020-07-14 MED ORDER — FAMOTIDINE 20 MG PO TABS
20.0000 mg | ORAL_TABLET | Freq: Two times a day (BID) | ORAL | Status: DC
  Administered 2020-07-14 – 2020-07-16 (×4): 20 mg via ORAL
  Filled 2020-07-14 (×4): qty 1

## 2020-07-14 MED ORDER — ACETAMINOPHEN 10 MG/ML IV SOLN
1000.0000 mg | Freq: Four times a day (QID) | INTRAVENOUS | Status: DC
  Administered 2020-07-14: 1000 mg via INTRAVENOUS
  Filled 2020-07-14: qty 100

## 2020-07-14 MED ORDER — PHENOL 1.4 % MT LIQD: 1.0000 | OROMUCOSAL | Status: DC | PRN

## 2020-07-14 MED ORDER — POVIDONE-IODINE 10 % EX SWAB
2.0000 "application " | Freq: Once | CUTANEOUS | Status: AC
  Administered 2020-07-14: 2 via TOPICAL

## 2020-07-14 MED ORDER — RIVAROXABAN 10 MG PO TABS
10.0000 mg | ORAL_TABLET | Freq: Every day | ORAL | Status: DC
  Administered 2020-07-15 – 2020-07-16 (×2): 10 mg via ORAL
  Filled 2020-07-14 (×2): qty 1

## 2020-07-14 MED ORDER — OXYCODONE HCL 5 MG PO TABS
ORAL_TABLET | ORAL | Status: AC
  Administered 2020-07-14: 5 mg via ORAL
  Filled 2020-07-14: qty 1

## 2020-07-14 MED ORDER — HYDROXYZINE HCL 25 MG PO TABS
25.0000 mg | ORAL_TABLET | Freq: Every day | ORAL | Status: DC
  Administered 2020-07-14 – 2020-07-15 (×2): 25 mg via ORAL
  Filled 2020-07-14 (×2): qty 1

## 2020-07-14 MED ORDER — EPHEDRINE SULFATE-NACL 50-0.9 MG/10ML-% IV SOSY
PREFILLED_SYRINGE | INTRAVENOUS | Status: DC | PRN
  Administered 2020-07-14 (×2): 5 mg via INTRAVENOUS
  Administered 2020-07-14: 10 mg via INTRAVENOUS
  Administered 2020-07-14 (×2): 5 mg via INTRAVENOUS

## 2020-07-14 MED ORDER — FENTANYL CITRATE (PF) 100 MCG/2ML IJ SOLN
25.0000 ug | INTRAMUSCULAR | Status: DC | PRN
  Administered 2020-07-14: 25 ug via INTRAVENOUS

## 2020-07-14 MED ORDER — ROSUVASTATIN CALCIUM 20 MG PO TABS
20.0000 mg | ORAL_TABLET | Freq: Every day | ORAL | Status: DC
  Administered 2020-07-14 – 2020-07-15 (×2): 20 mg via ORAL
  Filled 2020-07-14 (×2): qty 1

## 2020-07-14 MED ORDER — MEPIVACAINE HCL (PF) 2 % IJ SOLN
INTRAMUSCULAR | Status: DC | PRN
  Administered 2020-07-14: 70 mg via INTRATHECAL

## 2020-07-14 MED ORDER — MENTHOL 3 MG MT LOZG: 1.0000 | LOZENGE | OROMUCOSAL | Status: DC | PRN

## 2020-07-14 MED ORDER — LIDOCAINE 2% (20 MG/ML) 5 ML SYRINGE
INTRAMUSCULAR | Status: AC
  Filled 2020-07-14: qty 5

## 2020-07-14 MED ORDER — PROPOFOL 10 MG/ML IV BOLUS
INTRAVENOUS | Status: DC | PRN
  Administered 2020-07-14: 10 mg via INTRAVENOUS
  Administered 2020-07-14 (×2): 20 mg via INTRAVENOUS
  Administered 2020-07-14: 10 mg via INTRAVENOUS

## 2020-07-14 MED ORDER — CARVEDILOL 25 MG PO TABS
25.0000 mg | ORAL_TABLET | Freq: Two times a day (BID) | ORAL | Status: DC
  Administered 2020-07-15 – 2020-07-16 (×3): 25 mg via ORAL
  Filled 2020-07-14 (×3): qty 1

## 2020-07-14 MED ORDER — DEXAMETHASONE SODIUM PHOSPHATE 10 MG/ML IJ SOLN: 8.0000 mg | Freq: Once | INTRAMUSCULAR | Status: DC

## 2020-07-14 MED ORDER — METHOCARBAMOL 500 MG IVPB - SIMPLE MED
500.0000 mg | Freq: Four times a day (QID) | INTRAVENOUS | Status: DC | PRN
  Filled 2020-07-14: qty 50

## 2020-07-14 MED ORDER — PROMETHAZINE HCL 25 MG/ML IJ SOLN: 6.2500 mg | INTRAMUSCULAR | Status: DC | PRN

## 2020-07-14 MED ORDER — DEXAMETHASONE SODIUM PHOSPHATE 10 MG/ML IJ SOLN
10.0000 mg | Freq: Once | INTRAMUSCULAR | Status: AC
  Administered 2020-07-15: 10 mg via INTRAVENOUS
  Filled 2020-07-14: qty 1

## 2020-07-14 MED ORDER — ALBUTEROL SULFATE HFA 108 (90 BASE) MCG/ACT IN AERS: 1.0000 | INHALATION_SPRAY | Freq: Four times a day (QID) | RESPIRATORY_TRACT | Status: DC | PRN

## 2020-07-14 MED ORDER — SODIUM CHLORIDE 0.9 % IR SOLN
Status: DC | PRN
  Administered 2020-07-14: 3000 mL

## 2020-07-14 MED ORDER — SODIUM CHLORIDE 0.9 % IR SOLN
Status: DC | PRN
  Administered 2020-07-14: 1000 mL

## 2020-07-14 MED ORDER — FENTANYL CITRATE (PF) 100 MCG/2ML IJ SOLN
INTRAMUSCULAR | Status: AC
  Administered 2020-07-14: 25 ug via INTRAVENOUS
  Filled 2020-07-14: qty 2

## 2020-07-14 MED ORDER — PENTOSAN POLYSULFATE SODIUM 100 MG PO CAPS
100.0000 mg | ORAL_CAPSULE | Freq: Two times a day (BID) | ORAL | Status: DC
  Administered 2020-07-14: 100 mg via ORAL
  Filled 2020-07-14: qty 1

## 2020-07-14 MED ORDER — CLONIDINE HCL (ANALGESIA) 100 MCG/ML EP SOLN
EPIDURAL | Status: DC | PRN
  Administered 2020-07-14: 100 ug

## 2020-07-14 MED ORDER — BUPIVACAINE-EPINEPHRINE (PF) 0.5% -1:200000 IJ SOLN
INTRAMUSCULAR | Status: DC | PRN
  Administered 2020-07-14: 15 mL via PERINEURAL

## 2020-07-14 MED ORDER — DEXAMETHASONE SODIUM PHOSPHATE 10 MG/ML IJ SOLN
INTRAMUSCULAR | Status: DC | PRN
  Administered 2020-07-14: 4 mg via INTRAVENOUS

## 2020-07-14 MED ORDER — GABAPENTIN 300 MG PO CAPS: 300.0000 mg | ORAL_CAPSULE | Freq: Two times a day (BID) | ORAL | Status: DC | PRN

## 2020-07-14 MED ORDER — ONDANSETRON HCL 4 MG/2ML IJ SOLN
INTRAMUSCULAR | Status: DC | PRN
  Administered 2020-07-14: 4 mg via INTRAVENOUS

## 2020-07-14 MED ORDER — METOCLOPRAMIDE HCL 5 MG PO TABS: 5.0000 mg | ORAL_TABLET | Freq: Three times a day (TID) | ORAL | Status: DC | PRN

## 2020-07-14 MED ORDER — LACTATED RINGERS IV SOLN: INTRAVENOUS | Status: DC

## 2020-07-14 MED ORDER — SODIUM CHLORIDE 0.9 % IV SOLN: INTRAVENOUS | Status: DC

## 2020-07-14 MED ORDER — PHENYLEPHRINE HCL-NACL 10-0.9 MG/250ML-% IV SOLN
INTRAVENOUS | Status: DC | PRN
  Administered 2020-07-14: 25 ug/min via INTRAVENOUS

## 2020-07-14 MED ORDER — ACETAMINOPHEN 500 MG PO TABS
1000.0000 mg | ORAL_TABLET | Freq: Four times a day (QID) | ORAL | Status: AC
  Administered 2020-07-14 – 2020-07-15 (×4): 1000 mg via ORAL
  Filled 2020-07-14 (×4): qty 2

## 2020-07-14 MED ORDER — BISACODYL 10 MG RE SUPP: 10.0000 mg | Freq: Every day | RECTAL | Status: DC | PRN

## 2020-07-14 MED ORDER — DOCUSATE SODIUM 100 MG PO CAPS
100.0000 mg | ORAL_CAPSULE | Freq: Two times a day (BID) | ORAL | Status: DC
  Administered 2020-07-14 – 2020-07-16 (×4): 100 mg via ORAL
  Filled 2020-07-14 (×4): qty 1

## 2020-07-14 MED ORDER — ONDANSETRON HCL 4 MG/2ML IJ SOLN: 4.0000 mg | Freq: Four times a day (QID) | INTRAMUSCULAR | Status: DC | PRN

## 2020-07-14 MED ORDER — GLYCOPYRROLATE PF 0.2 MG/ML IJ SOSY
PREFILLED_SYRINGE | INTRAMUSCULAR | Status: AC
  Filled 2020-07-14: qty 1

## 2020-07-14 MED ORDER — ONDANSETRON HCL 4 MG/2ML IJ SOLN
INTRAMUSCULAR | Status: AC
  Filled 2020-07-14: qty 2

## 2020-07-14 MED ORDER — ONDANSETRON HCL 4 MG PO TABS: 4.0000 mg | ORAL_TABLET | Freq: Four times a day (QID) | ORAL | Status: DC | PRN

## 2020-07-14 MED ORDER — DONEPEZIL HCL 10 MG PO TABS
10.0000 mg | ORAL_TABLET | Freq: Every day | ORAL | Status: DC
  Administered 2020-07-14 – 2020-07-15 (×2): 10 mg via ORAL
  Filled 2020-07-14 (×3): qty 1

## 2020-07-14 MED ORDER — OXYCODONE HCL 5 MG PO TABS
10.0000 mg | ORAL_TABLET | ORAL | Status: DC | PRN
  Administered 2020-07-14 – 2020-07-15 (×5): 10 mg via ORAL
  Filled 2020-07-14 (×7): qty 2

## 2020-07-14 MED ORDER — CEFAZOLIN SODIUM-DEXTROSE 2-4 GM/100ML-% IV SOLN
2.0000 g | Freq: Four times a day (QID) | INTRAVENOUS | Status: AC
  Administered 2020-07-14 – 2020-07-15 (×2): 2 g via INTRAVENOUS
  Filled 2020-07-14 (×2): qty 100

## 2020-07-14 SURGICAL SUPPLY — 78 items
ADAPTER BOLT FEMORAL +2/-2 (Knees) ×1 IMPLANT
ADPR FEM +2/-2 OFST BOLT (Knees) ×1 IMPLANT
ADPR FEM 5D STRL KN PFC SGM (Orthopedic Implant) ×1 IMPLANT
AUG FEM SZ3 4 CMB POST STRL LF (Knees) ×2 IMPLANT
AUG FEM SZ3 4 STRL LF KN LT TI (Knees) ×2 IMPLANT
AUG TIB SZ2.5 10 REV STP WDG (Knees) ×2 IMPLANT
BAG DECANTER FOR FLEXI CONT (MISCELLANEOUS) ×2 IMPLANT
BAG SPEC THK2 15X12 ZIP CLS (MISCELLANEOUS)
BAG ZIPLOCK 12X15 (MISCELLANEOUS) IMPLANT
BLADE SAG 18X100X1.27 (BLADE) ×2 IMPLANT
BLADE SAW SGTL 11.0X1.19X90.0M (BLADE) ×2 IMPLANT
BLADE SURG SZ10 CARB STEEL (BLADE) ×4 IMPLANT
BNDG ELASTIC 6X5.8 VLCR STR LF (GAUZE/BANDAGES/DRESSINGS) ×2 IMPLANT
BONE CEMENT GENTAMICIN (Cement) ×6 IMPLANT
CEMENT BONE GENTAMICIN 40 (Cement) ×3 IMPLANT
CEMENT RESTRICTOR DEPUY SZ 4 (Cement) ×1 IMPLANT
CLOTH BEACON ORANGE TIMEOUT ST (SAFETY) ×2 IMPLANT
COVER SURGICAL LIGHT HANDLE (MISCELLANEOUS) ×2 IMPLANT
COVER WAND RF STERILE (DRAPES) IMPLANT
CUFF TOURN SGL QUICK 34 (TOURNIQUET CUFF) ×2
CUFF TRNQT CYL 34X4.125X (TOURNIQUET CUFF) ×1 IMPLANT
DECANTER SPIKE VIAL GLASS SM (MISCELLANEOUS) IMPLANT
DISTAL WEDGE PFC 4MM LEFT (Knees) ×4 IMPLANT
DRAPE U-SHAPE 47X51 STRL (DRAPES) ×2 IMPLANT
DRSG ADAPTIC 3X8 NADH LF (GAUZE/BANDAGES/DRESSINGS) ×2 IMPLANT
DRSG AQUACEL AG ADV 3.5X14 (GAUZE/BANDAGES/DRESSINGS) ×1 IMPLANT
DRSG PAD ABDOMINAL 8X10 ST (GAUZE/BANDAGES/DRESSINGS) ×2 IMPLANT
DURAPREP 26ML APPLICATOR (WOUND CARE) ×2 IMPLANT
ELECT REM PT RETURN 15FT ADLT (MISCELLANEOUS) ×2 IMPLANT
EVACUATOR 1/8 PVC DRAIN (DRAIN) ×2 IMPLANT
FEM TC3 PFC SZ3 LEFT (Orthopedic Implant) ×2 IMPLANT
FEMORAL ADAPTER (Orthopedic Implant) ×1 IMPLANT
FEMORAL TC3 PFC SZ3 LEFT (Orthopedic Implant) IMPLANT
GAUZE SPONGE 4X4 12PLY STRL (GAUZE/BANDAGES/DRESSINGS) ×2 IMPLANT
GLOVE SRG 8 PF TXTR STRL LF DI (GLOVE) ×1 IMPLANT
GLOVE SURG ENC MOIS LTX SZ6 (GLOVE) IMPLANT
GLOVE SURG ENC MOIS LTX SZ6.5 (GLOVE) ×4 IMPLANT
GLOVE SURG ENC MOIS LTX SZ8 (GLOVE) ×4 IMPLANT
GLOVE SURG UNDER POLY LF SZ7 (GLOVE) ×2 IMPLANT
GLOVE SURG UNDER POLY LF SZ8 (GLOVE) ×2
GLOVE SURG UNDER POLY LF SZ8.5 (GLOVE) IMPLANT
GOWN STRL REUS W/TWL LRG LVL3 (GOWN DISPOSABLE) ×4 IMPLANT
HANDPIECE INTERPULSE COAX TIP (DISPOSABLE) ×2
HOLDER FOLEY CATH W/STRAP (MISCELLANEOUS) ×1 IMPLANT
IMMOBILIZER KNEE 20 (SOFTGOODS) ×2
IMMOBILIZER KNEE 20 THIGH 36 (SOFTGOODS) ×1 IMPLANT
INSERT TC3 RP TIBIAL SZ 3.0 (Knees) ×1 IMPLANT
KIT TURNOVER KIT A (KITS) ×2 IMPLANT
MANIFOLD NEPTUNE II (INSTRUMENTS) ×2 IMPLANT
NS IRRIG 1000ML POUR BTL (IV SOLUTION) ×2 IMPLANT
PACK TOTAL KNEE CUSTOM (KITS) ×2 IMPLANT
PADDING CAST COTTON 6X4 STRL (CAST SUPPLIES) ×3 IMPLANT
PENCIL SMOKE EVACUATOR (MISCELLANEOUS) ×1 IMPLANT
POST AVE PFC 4MM (Knees) ×2 IMPLANT
PROTECTOR NERVE ULNAR (MISCELLANEOUS) ×2 IMPLANT
SET HNDPC FAN SPRY TIP SCT (DISPOSABLE) ×1 IMPLANT
STEM TIBIA PFC 13X30MM (Stem) ×1 IMPLANT
STEM UNIVERSAL REVISION 75X14 (Stem) ×1 IMPLANT
STRIP CLOSURE SKIN 1/2X4 (GAUZE/BANDAGES/DRESSINGS) ×2 IMPLANT
SUT MNCRL AB 4-0 PS2 18 (SUTURE) ×2 IMPLANT
SUT STRATAFIX 0 PDS 27 VIOLET (SUTURE) ×2
SUT VIC AB 2-0 CT1 27 (SUTURE) ×6
SUT VIC AB 2-0 CT1 TAPERPNT 27 (SUTURE) ×3 IMPLANT
SUTURE STRATFX 0 PDS 27 VIOLET (SUTURE) ×1 IMPLANT
SWAB COLLECTION DEVICE MRSA (MISCELLANEOUS) IMPLANT
SWAB CULTURE ESWAB REG 1ML (MISCELLANEOUS) IMPLANT
SYR 50ML LL SCALE MARK (SYRINGE) ×3 IMPLANT
TOWER CARTRIDGE SMART MIX (DISPOSABLE) ×2 IMPLANT
TRAY FOLEY MTR SLVR 14FR STAT (SET/KITS/TRAYS/PACK) ×1 IMPLANT
TRAY FOLEY MTR SLVR 16FR STAT (SET/KITS/TRAYS/PACK) ×1 IMPLANT
TRAY REVISION SZ 3 (Knees) ×1 IMPLANT
TRAY SLEEVE CEM ML (Knees) ×1 IMPLANT
TUBE KAMVAC SUCTION (TUBING) IMPLANT
TUBE SUCTION HIGH CAP CLEAR NV (SUCTIONS) ×2 IMPLANT
WATER STERILE IRR 1000ML POUR (IV SOLUTION) ×3 IMPLANT
WEDGE DISTAL PFC LEFT 4MM (Knees) IMPLANT
WEDGE STEP 2.5 10MM (Knees) ×2 IMPLANT
WRAP KNEE MAXI GEL POST OP (GAUZE/BANDAGES/DRESSINGS) ×1 IMPLANT

## 2020-07-14 NOTE — Progress Notes (Signed)
Orthopedic Tech Progress Note Patient Details:  [name] [birth year] [id number]  CPM Left Knee CPM Left Knee: On Left Knee Flexion (Degrees): 40 Left Knee Extension (Degrees): 10  Post Interventions Patient Tolerated: Well Instructions Provided: Care of device  [name] 07/14/2020, 4:34 PM

## 2020-07-14 NOTE — Anesthesia Procedure Notes (Signed)
Spinal  Patient location during procedure: OR Start time: 07/14/2020 1:37 PM End time: 07/14/2020 1:42 PM Reason for block: surgical anesthesia Staffing Performed: anesthesiologist  Anesthesiologist: Brennan Bailey, MD Preanesthetic Checklist Completed: patient identified, IV checked, risks and benefits discussed, surgical consent, monitors and equipment checked, pre-op evaluation and timeout performed Spinal Block Patient position: sitting Prep: DuraPrep and site prepped and draped Patient monitoring: continuous pulse ox, blood pressure and heart rate Approach: midline Location: L2-3 Injection technique: single-shot Needle Needle type: Pencan  Needle gauge: 24 G Needle length: 9 cm Assessment Events: CSF return Additional Notes Risks, benefits, and alternative discussed. Patient gave consent to procedure. Prepped and draped in sitting position. Patient sedated but responsive to voice. Significant thoracic kyphosis and lumbar lordosis noted. 2 attempts required, first at L3-4, second at L2-3. Positive terminal aspiration. No pain or paraesthesias with injection. Patient tolerated procedure well. Vital signs stable. Tawny Asal, MD

## 2020-07-14 NOTE — H&P (Signed)
TOTAL KNEE ADMISSION H&P  Patient is being admitted for left total knee arthroplasty.  Subjective:  Chief Complaint: Left knee pain.  HPI: Shannon [name], 75 y.o. female presents for preoperative history and physical exam for her upcoming surgical procedure - left total knee arthroplasty revision, which is scheduled on 07/21/2020 with Dr. Wynelle Link at [facility]. The patient has had symptoms in the left knee including pain which has impacted their quality of life and ability to do activities of daily living. The patient currently has a diagnosis of left knee primary osteoarthritis and has failed conservative treatments including activity modification, NSAIDs. The patient has had TKA on the left knee. The patient denies an active infection.  Patient Active Problem List   Diagnosis Date Noted   Right knee pain 07/10/2020   Cognitive decline 07/05/2020   Urinary frequency 02/11/2020   Blood in stool 02/11/2020   Other headache syndrome 12/23/2017   Debility 12/23/2017   Left knee pain 12/23/2017   Closed fracture of maxilla, initial encounter ([place]) 10/17/2017   Vitamin D deficiency 06/14/2017   Vertebral fracture, osteoporotic, sequela 04/05/2016   Left-sided back pain 03/06/2016   Chest pain 02/27/2016   Syncope 02/27/2016   Cataracts, bilateral 12/28/2015   Recurrent falls 12/28/2015   Genetic testing 11/29/2015   Family history- stomach cancer 09/08/2015   Family history of cancer 09/08/2015   [name] lesion 06/21/2015   Medicare annual wellness visit, subsequent 12/27/2014   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 10/11/2014   Obesity 09/28/2014   Interstitial cystitis 09/28/2014   Pain in joint, ankle and foot 09/28/2014   Abnormal nuclear stress test 12/16/2013   Sternal pain 11/06/2013   Postoperative anemia due to acute blood loss 02/13/2013   Preop cardiovascular exam 12/30/2012   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   Breast  cancer of lower-inner quadrant of right female breast ([place]) 10/21/2012   Insomnia 10/18/2012   UTI (urinary tract infection) 03/03/2012   Constipation 03/03/2012   OA (osteoarthritis) 09/07/2011   Dermatitis    Hyponatremia 08/28/2011   Osteoporosis 03/07/2011   IC (interstitial cystitis)    Melanoma ([place])    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   Prediabetes 06/15/2009   LIPOMA 02/10/2009   Dyspnea on exertion 02/10/2009   Hyperlipidemia, mixed 08/11/2008   Depression with anxiety 08/11/2008   Essential hypertension 08/11/2008    Past Medical History:  Diagnosis Date   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   Anemia 08/28/2011   Asthma    as a child   Brain tumor ([place])    Breast cancer ([place])    right breast   Carpal tunnel syndrome of left wrist    Cataracts, bilateral 12/28/2015   Constipation 03/03/2012   DEPRESSION 08/11/2008   Dermatitis    Facial fracture ([place])    GERD 08/11/2008   H/O measles    H/O mumps    History of blood transfusion    History of chicken pox    History of hiatal hernia    Hx breast cancer, IDC, Right, 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/2009   BRCA 2 NEGATIVE, CHEMO AND RADIATION X 1 YR   HYPERLIPIDEMIA 08/11/2008   HYPERTENSION 08/11/2008   Hyponatremia 08/28/2011   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    Melanoma ([place]) 2008   DR. Ronnald Ramp   Neuropathy    NICM (nonischemic cardiomyopathy) ([place]) 03/08/2010   likely 2/2 chemotx - a. Echo 2012: EF 45-50%;  b. Lex MV 2/12:  low risk, apical defect (small area of ischemia vs shifting breast atten);  c.  Echo 7/12: Normal  wall thickness, EF 60-65%, normal wall motion, grade 1 diastolic dysfunction, mild LAE, PASP 32;   d. Lex MV 11/13:  EF 76%, no ischemia   OA (osteoarthritis) 09/07/2011   Obesity 09/28/2014   Osteoporosis    Personal history of chemotherapy 2012   Personal history of radiation therapy 2012   PREDIABETES 06/15/2009   Recurrent falls 12/28/2015   Shortness of breath    UTI (lower urinary tract infection) 03/03/2012   Vertebral fracture, osteoporotic, sequela 04/05/2016    Past Surgical History:  Procedure Laterality Date   BREAST LUMPECTOMY  04/2009   RIGHT FOR BREAST CANCER-CHEMO/RADIATION X 1 YEAR   BREAST REDUCTION SURGERY Bilateral 05/04/2014   Procedure: MAMMARY REDUCTION  (BREAST);  Surgeon: Cristine Polio, MD;  Location: [facility];  Service: Plastics;  Laterality: Bilateral;   CARPAL TUNNEL RELEASE     L wrist, ulnar nerve moved   CHOLECYSTECTOMY     CRANIOTOMY FOR TUMOR  2000   ELBOW SURGERY     left   KNEE ARTHROSCOPY Left 10/12/2014   Procedure: LEFT KNEE ARTHROSCOPY ;  Surgeon: Gaynelle Arabian, MD;  Location: [facility];  Service: Orthopedics;  Laterality: Left;   KYPHOPLASTY N/A 03/30/2016   Procedure: THORACIC 12 KYPHOPLASTY;  Surgeon: Phylliss Bob, MD;  Location: [facility];  Service: Orthopedics;  Laterality: N/A;  THORACIC 12 KYPHOPLASTY   LEFT HEART CATHETERIZATION WITH CORONARY ANGIOGRAM N/A 12/16/2013   Procedure: LEFT HEART CATHETERIZATION WITH CORONARY ANGIOGRAM;  Surgeon: Peter M [name], MD;  Location: [facility] C[facility];  Service: Cardiovascular;  Laterality: N/A;   LIPOMA EXCISION  03/28/2009   right leg   MELANOMA EXCISION     PORT-A-CATH REMOVAL  11/30/2010   [place]   porta cath     PORTACATH PLACEMENT  may 2011   REDUCTION MAMMAPLASTY Bilateral    SYNOVECTOMY Left 10/12/2014   Procedure: WITH SYNOVECTOMY;  Surgeon: Gaynelle Arabian, MD;  Location: [facility];  Service: Orthopedics;  Laterality: Left;   TONSILLECTOMY  1958   TOTAL KNEE ARTHROPLASTY  02/05/2012    Procedure: TOTAL KNEE ARTHROPLASTY;  Surgeon: Gearlean Alf, MD;  Location: [facility];  Service: Orthopedics;  Laterality: Right;   TOTAL KNEE ARTHROPLASTY Left 02/10/2013   Procedure: LEFT TOTAL KNEE ARTHROPLASTY;  Surgeon: Gearlean Alf, MD;  Location: [facility];  Service: Orthopedics;  Laterality: Left;   total knee raplacement  01-2012   Right Knee   TUBAL LIGATION  1997    Prior to Admission medications   Medication Sig Start Date End Date Taking? Authorizing Provider  albuterol (VENTOLIN HFA) 108 (90 Base) MCG/ACT inhaler Inhale 1-2 puffs into the lungs every 6 (six) hours as needed for wheezing or shortness of breath. 08/26/19  Yes Mosie Lukes, MD  carvedilol (COREG) 25 MG tablet TAKE 1 TABLET TWICE DAILY WITH A MEAL Patient taking differently: Take 25 mg by mouth 2 (two) times daily with a meal. 06/29/20  Yes Crenshaw, Denice Bors, MD  cetirizine (ZYRTEC)  10 MG tablet Take 10 mg by mouth daily.   Yes [provider]  cholecalciferol (VITAMIN D3) 25 MCG (1000 UNIT) tablet Take 1,000 Units by mouth in the morning and at bedtime.   Yes [provider]  clonazePAM (KLONOPIN) 0.5 MG tablet Take 0.5-1 tablets (0.25-0.5 mg total) by mouth 2 (two) times daily as needed for anxiety. 03/15/20  Yes Mosie Lukes, MD  diphenhydrAMINE (BENADRYL) 25 mg capsule Take 25 mg by mouth at bedtime.   Yes [provider]  donepezil (ARICEPT) 10 MG tablet Take 1 tablet (10 mg total) by mouth at bedtime. 05/10/20  Yes Jaffe, Adam R, DO  famotidine (PEPCID) 20 MG tablet TAKE 1 TABLET BY MOUTH TWICE A DAY Patient taking differently: Take 20 mg by mouth 2 (two) times daily. 06/08/20  Yes Mosie Lukes, MD  ferrous sulfate 325 (65 FE) MG tablet Take 325 mg by mouth daily with breakfast.   Yes [provider]  folic acid (FOLVITE) 1 MG tablet Take 1 tablet (1 mg total) by mouth daily. Patient taking differently: Take 1 mg by mouth at bedtime. 01/19/20  Yes Mosie Lukes, MD   gabapentin (NEURONTIN) 300 MG capsule Take 1 capsule (300 mg total) by mouth 2 (two) times daily as needed. 07/28/19  Yes Mosie Lukes, MD  hydrOXYzine (ATARAX/VISTARIL) 25 MG tablet Take 25 mg by mouth at bedtime.   Yes [provider]  Multiple Vitamin (MULTIVITAMIN WITH MINERALS) TABS tablet Take 1 tablet by mouth every evening.   Yes [provider]  naproxen sodium (ALEVE) 220 MG tablet Take 220 mg by mouth in the morning and at bedtime.   Yes [provider]  pentosan polysulfate (ELMIRON) 100 MG capsule Take 100 mg by mouth 2 (two) times daily.  02/07/12  Yes Perkins, Alexzandrew L, PA-C  rosuvastatin (CRESTOR) 20 MG tablet TAKE 1 TABLET AT BEDTIME Patient taking differently: Take 20 mg by mouth at bedtime. 12/10/19  Yes Lelon Perla, MD  telmisartan (MICARDIS) 80 MG tablet TAKE 1 TABLET EVERY DAY Patient taking differently: Take 80 mg by mouth in the morning. 06/22/20  Yes Stanford Breed, Denice Bors, MD  venlafaxine XR (EFFEXOR-XR) 75 MG 24 hr capsule TAKE 2 CAPSULES EVERY DAY WITH BREAKFAST Patient taking differently: Take 150 mg by mouth daily with breakfast. 07/07/20  Yes Mosie Lukes, MD  Vitamin D, Ergocalciferol, (DRISDOL) 1.25 MG (50000 UNIT) CAPS capsule Take 1 capsule (50,000 Units total) by mouth every 7 (seven) days. 07/07/20  Yes Mosie Lukes, MD  AMBULATORY NON FORMULARY MEDICATION Nitroglycerin 0.125% gel applied topically to the rectum TID x 6-8 weeks 03/25/20   Thornton Park, MD  COVID-19 mRNA Vac-TriS, Pfizer, (PFIZER-BIONT COVID-19 VAC-TRIS) SUSP injection Inject into the muscle. Patient not taking: No sig reported 07/05/20   Carlyle Basques, MD    Allergies  Allergen Reactions   Dilaudid [Hydromorphone Hcl] Itching    Social History   Socioeconomic History   Marital status: Married    Spouse name: Deidre Ala   Number of children: 3   Years of education: Not on file   Highest education level: Not on file  Occupational History    Occupation: boutique owner-retired    Employer: [employer]  Tobacco Use   Smoking status: Never   Smokeless tobacco: Never  Vaping Use   Vaping Use: Never used  Substance and Sexual Activity   Alcohol use: No   Drug use: No   Sexual activity: Not Currently  Other Topics Concern   Not on file  Social History Narrative   Patient is right-handed. She lives with her husband in a 4 story home. They take care of their grown son with down syndrome who has had a stroke. She drinks 1-2 glasses of tea in the evenings. She does not formally exercise.   Social Determinants of Health   Financial Resource Strain: Not on file  Food Insecurity: Not on file  Transportation Needs: Not on file  Physical Activity: Not on file  Stress: Not on file  Social Connections: Not on file  Intimate Partner Violence: Not on file    Tobacco Use: Low Risk    Smoking Tobacco Use: Never   Smokeless Tobacco Use: Never   Social History   Substance and Sexual Activity  Alcohol Use No    Family History  Problem Relation Age of Onset   Breast cancer Mother        sarcoma   Lung cancer Mother    Hypertension Mother    Prostate cancer Father    Congestive Heart Failure Father    Heart attack Father    Prostate cancer Brother    Down syndrome Son    CVA Son    Heart disease Maternal Grandfather        MI   Stomach cancer Maternal Aunt    Uterine cancer Maternal Aunt    Colon cancer Neg Hx     ROS: Constitutional: no fever, no chills, no night sweats, no significant weight loss Cardiovascular: no chest pain, no palpitations Respiratory: no cough, no shortness of breath, No COPD Gastrointestinal: no vomiting, no nausea Musculoskeletal: no swelling in Joints, Joint Pain Neurologic: no numbness, no tingling, no difficulty with balance   Objective:  Physical Exam: Well nourished and well developed.  General: Alert and oriented x3, cooperative and pleasant, no acute distress.  Head:  normocephalic, atraumatic, neck supple.  Eyes: EOMI.  Respiratory: breath sounds clear in all fields, no wheezing, rales, or rhonchi. Cardiovascular: Regular rate and rhythm, no murmurs, gallops or rubs.  Abdomen: non-tender to palpation and soft, normoactive bowel sounds. Musculoskeletal:    Left Knee Exam:  No warmth or effusion.  Range of motion is 0 to 120 degrees.  She is somewhat tender proximally around the proximal tibia.  No tenderness around the femur.  No crepitus on range of motion of the knee.  Stable knee.    Sensation and motor function intact in LE. Distal pulses 2+. Calves soft and nontender.    Radiographs- AP and lateral of the left knee were reviewed from her last visit and demonstrate some tilt to the tibial component that was not there before.    These x-rays were ordered, reviewed, and interpreted independently and were discussed with the patient.    Bone scan confirms that there is increased uptake around the tibial component.   Vital signs in last 24 hours:    Imaging Review Radiographs- AP and lateral of the left knee were reviewed from her last visit and demonstrate some tilt to the tibial component that was not there before.    These x-rays were ordered, reviewed, and interpreted independently and were discussed with the patient.    Bone scan confirms that there is increased uptake around the tibial component.   Assessment/Plan:  End stage arthritis, left knee   The patient history, physical examination, clinical judgment of the provider and imaging studies are consistent with end stage degenerative joint disease of the left  knee and total knee arthroplasty is deemed medically necessary. The treatment options including medical management, injection therapy arthroscopy and arthroplasty were discussed at length. The risks and benefits of total knee arthroplasty were presented and reviewed. The risks due to aseptic loosening, infection, stiffness,  patella tracking problems, thromboembolic complications and other imponderables were discussed. The patient acknowledged the explanation, agreed to proceed with the plan and consent was signed. Patient is being admitted for inpatient treatment for surgery, pain control, PT, OT, prophylactic antibiotics, VTE prophylaxis, progressive ambulation and ADLs and discharge planning. The patient is planning to be discharged  home .   Patient's anticipated LOS is less than 2 midnights, meeting these requirements: - Lives within 1 hour of care - Has a competent adult at home to recover with post-op recovery - NO history of  - Chronic pain requiring opioids  - Diabetes  - Coronary Artery Disease  - Heart failure  - Heart attack  - Stroke  - DVT/VTE  - Cardiac arrhythmia  - Respiratory Failure/COPD  - Renal failure  - Anemia  - Advanced Liver disease   Therapy Plans: Home Health PT, [facility] Disposition: Home with Husband Planned DVT Prophylaxis: Xarelto, 25m  DME Needed: None PCP: SVivien Rossetti MD TXA: IV Allergies: Dilaudid Anesthesia Concerns: None BMI: 32.5 Last HgbA1c: N/A  [facility]  - Patient was instructed on what medications to stop prior to surgery. - Follow-up visit in 2 weeks with Dr. AWynelle Link- Begin physical therapy following surgery - Pre-operative lab work as pre-surgical testing - Prescriptions will be provided in hospital at time of discharge  SFenton Foy [facility] PA-C Orthopedic Surgery [facility] [place]

## 2020-07-14 NOTE — Anesthesia Preprocedure Evaluation (Addendum)
Anesthesia Evaluation  Patient identified by MRN, date of birth, ID band Patient awake    Reviewed: Allergy & Precautions, NPO status , Patient's Chart, lab work & pertinent test results, reviewed documented beta blocker date and time   History of Anesthesia Complications Negative for: history of anesthetic complications  Airway Mallampati: III  TM Distance: >3 FB Neck ROM: Full    Dental no notable dental hx.    Pulmonary neg pulmonary ROS,    Pulmonary exam normal        Cardiovascular hypertension, Pt. on medications and Pt. on home beta blockers Normal cardiovascular exam  TTE 12/2019: EF 55-60%, grade I DD, valves ok   Neuro/Psych Anxiety Depression negative neurological ROS     GI/Hepatic Neg liver ROS, hiatal hernia, GERD  Medicated and Controlled,  Endo/Other  negative endocrine ROS  Renal/GU negative Renal ROS  negative genitourinary   Musculoskeletal  (+) Arthritis ,   Abdominal   Peds  Hematology negative hematology ROS (+)   Anesthesia Other Findings Day of surgery medications reviewed with patient.  Reproductive/Obstetrics negative OB ROS                            Anesthesia Physical Anesthesia Plan  ASA: 2  Anesthesia Plan: Spinal   Post-op Pain Management:  Regional for Post-op pain   Induction:   PONV Risk Score and Plan: 3 and Treatment may vary due to age or medical condition, Ondansetron, Propofol infusion and Dexamethasone  Airway Management Planned: Natural Airway and Simple Face Mask  Additional Equipment: None  Intra-op Plan:   Post-operative Plan:   Informed Consent: I have reviewed the patients History and Physical, chart, labs and discussed the procedure including the risks, benefits and alternatives for the proposed anesthesia with the patient or authorized representative who has indicated his/her understanding and acceptance.       Plan  Discussed with: CRNA  Anesthesia Plan Comments:        Anesthesia Quick Evaluation

## 2020-07-14 NOTE — Interval H&P Note (Signed)
History and Physical Interval Note:  07/14/2020 12:02 PM  Shannon [name]  has presented today for surgery, with the diagnosis of Failed left total knee arthroplasty.  The various methods of treatment have been discussed with the patient and family. After consideration of risks, benefits and other options for treatment, the patient has consented to  Procedure(s): TOTAL KNEE REVISION (Left) as a surgical intervention.  The patient's history has been reviewed, patient examined, no change in status, stable for surgery.  I have reviewed the patient's chart and labs.  Questions were answered to the patient's satisfaction.     Pilar Plate Shannon [name]

## 2020-07-14 NOTE — Discharge Instructions (Addendum)
Shannon Arabian, MD Total Joint Specialist [facility] [place] [address], [address] [place], [place] [address] [phone]  TOTAL KNEE REVISION POSTOPERATIVE DIRECTIONS  Knee Rehabilitation, Guidelines Following Surgery  Results after knee surgery are often greatly improved when you follow the exercise, range of motion and muscle strengthening exercises prescribed by your doctor. Safety measures are also important to protect the knee from further injury. If any of these exercises cause you to have increased pain or swelling in your knee joint, decrease the amount until you are comfortable again and slowly increase them. If you have problems or questions, call your caregiver or physical therapist for advice.   BLOOD CLOT PREVENTION Take a 10 mg Xarelto once a day for three weeks following surgery. Then take an 81 mg Aspirin once a day for three weeks. Then discontinue Aspirin. You may resume your vitamins/supplements once you have discontinued the Xarelto. Do not take any NSAIDs (Advil, Aleve, Ibuprofen, Meloxicam, etc.) until you have discontinued the Xarelto.   HOME CARE INSTRUCTIONS  Remove items at home which could result in a fall. This includes throw rugs or furniture in walking pathways.  ICE to the affected knee as much as tolerated. Icing helps control swelling. If the swelling is well controlled you will be more comfortable and rehab easier. Continue to use ice on the knee for pain and swelling from surgery. You may notice swelling that will progress down to the foot and ankle. This is normal after surgery. Elevate the leg when you are not up walking on it.    Continue to use the breathing machine which will help keep your temperature down. It is common for your temperature to cycle up and down following surgery, especially at night when you are not up moving around and exerting yourself. The breathing machine keeps your lungs expanded and your temperature down. Do not  place pillow under the operative knee, focus on keeping the knee straight while resting  DIET You may resume your previous home diet once you are discharged from the hospital.  DRESSING / WOUND CARE / SHOWERING Keep your bulky bandage on for 2 days. On the third post-operative day you may remove the Ace bandage and gauze. There is a waterproof adhesive bandage on your skin which will stay in place until your first follow-up appointment. Once you remove this you will not need to place another bandage You may begin showering 3 days following surgery, but do not submerge the incision under water.  ACTIVITY For the first 5 days, the key is rest and control of pain and swelling Do your home exercises twice a day starting on post-operative day 3. On the days you go to physical therapy, just do the home exercises once that day. You should rest, ice and elevate the leg for 50 minutes out of every hour. Get up and walk/stretch for 10 minutes per hour. After 5 days you can increase your activity slowly as tolerated. Walk with your walker as instructed. Use the walker until you are comfortable transitioning to a cane. Walk with the cane in the opposite hand of the operative leg. You may discontinue the cane once you are comfortable and walking steadily. Avoid periods of inactivity such as sitting longer than an hour when not asleep. This helps prevent blood clots.  You may discontinue the knee immobilizer once you are able to perform a straight leg raise while lying down. You may resume a sexual relationship in one month or when given  the OK by your doctor.  You may return to work once you are cleared by your doctor.  Do not drive a car for 6 weeks or until released by your surgeon.  Do not drive while taking narcotics.  TED HOSE STOCKINGS Wear the elastic stockings on both legs for three weeks following surgery during the day. You may remove them at night for sleeping.  WEIGHT BEARING Weight bearing  as tolerated with assist device (walker, cane, etc) as directed, use it as long as suggested by your surgeon or therapist, typically at least 4-6 weeks.  POSTOPERATIVE CONSTIPATION PROTOCOL Constipation - defined medically as fewer than three stools per week and severe constipation as less than one stool per week.  One of the most common issues patients have following surgery is constipation.  Even if you have a regular bowel pattern at home, your normal regimen is likely to be disrupted due to multiple reasons following surgery.  Combination of anesthesia, postoperative narcotics, change in appetite and fluid intake all can affect your bowels.  In order to avoid complications following surgery, here are some recommendations in order to help you during your recovery period.  Colace (docusate) - Pick up an over-the-counter form of Colace or another stool softener and take twice a day as long as you are requiring postoperative pain medications.  Take with a full glass of water daily.  If you experience loose stools or diarrhea, hold the colace until you stool forms back up. If your symptoms do not get better within 1 week or if they get worse, check with your doctor. Dulcolax (bisacodyl) - Pick up over-the-counter and take as directed by the product packaging as needed to assist with the movement of your bowels.  Take with a full glass of water.  Use this product as needed if not relieved by Colace only.  MiraLax (polyethylene glycol) - Pick up over-the-counter to have on hand. MiraLax is a solution that will increase the amount of water in your bowels to assist with bowel movements.  Take as directed and can mix with a glass of water, juice, soda, coffee, or tea. Take if you go more than two days without a movement. Do not use MiraLax more than once per day. Call your doctor if you are still constipated or irregular after using this medication for 7 days in a row.  If you continue to have problems with  postoperative constipation, please contact the office for further assistance and recommendations.  If you experience "the worst abdominal pain ever" or develop nausea or vomiting, please contact the office immediatly for further recommendations for treatment.  ITCHING If you experience itching with your medications, try taking only a single pain pill, or even half a pain pill at a time.  You can also use Benadryl over the counter for itching or also to help with sleep.   MEDICATIONS See your medication summary on the "After Visit Summary" that the nursing staff will review with you prior to discharge.  You may have some home medications which will be placed on hold until you complete the course of blood thinner medication.  It is important for you to complete the blood thinner medication as prescribed by your surgeon.  Continue your approved medications as instructed at time of discharge.  PRECAUTIONS If you experience chest pain or shortness of breath - call 911 immediately for transfer to the hospital emergency department.  If you develop a fever greater that 101 F, purulent drainage from  wound, increased redness or drainage from wound, foul odor from the wound/dressing, or calf pain - CONTACT YOUR SURGEON.                                                   FOLLOW-UP APPOINTMENTS Make sure you keep all of your appointments after your operation with your surgeon and caregivers. You should call the office at the above phone number and make an appointment for approximately two weeks after the date of your surgery or on the date instructed by your surgeon outlined in the "After Visit Summary".  RANGE OF MOTION AND STRENGTHENING EXERCISES  Rehabilitation of the knee is important following a knee injury or an operation. After just a few days of immobilization, the muscles of the thigh which control the knee become weakened and shrink (atrophy). Knee exercises are designed to build up the tone and strength  of the thigh muscles and to improve knee motion. Often times heat used for twenty to thirty minutes before working out will loosen up your tissues and help with improving the range of motion but do not use heat for the first two weeks following surgery. These exercises can be done on a training (exercise) mat, on the floor, on a table or on a bed. Use what ever works the best and is most comfortable for you Knee exercises include:  Leg Lifts - While your knee is still immobilized in a splint or cast, you can do straight leg raises. Lift the leg to 60 degrees, hold for 3 sec, and slowly lower the leg. Repeat 10-20 times 2-3 times daily. Perform this exercise against resistance later as your knee gets better.  Quad and Hamstring Sets - Tighten up the muscle on the front of the thigh (Quad) and hold for 5-10 sec. Repeat this 10-20 times hourly. Hamstring sets are done by pushing the foot backward against an object and holding for 5-10 sec. Repeat as with quad sets.  Leg Slides: Lying on your back, slowly slide your foot toward your buttocks, bending your knee up off the floor (only go as far as is comfortable). Then slowly slide your foot back down until your leg is flat on the floor again. Angel Wings: Lying on your back spread your legs to the side as far apart as you can without causing discomfort.  A rehabilitation program following serious knee injuries can speed recovery and prevent re-injury in the future due to weakened muscles. Contact your doctor or a physical therapist for more information on knee rehabilitation.   POST-OPERATIVE OPIOID TAPER INSTRUCTIONS: It is important to wean off of your opioid medication as soon as possible. If you do not need pain medication after your surgery it is ok to stop day one. Opioids include: Codeine, Hydrocodone(Norco, Vicodin), Oxycodone(Percocet, oxycontin) and hydromorphone amongst others.  Long term and even short term use of opiods can cause: Increased pain  response Dependence Constipation Depression Respiratory depression And more.  Withdrawal symptoms can include Flu like symptoms Nausea, vomiting And more Techniques to manage these symptoms Hydrate well Eat regular healthy meals Stay active Use relaxation techniques(deep breathing, meditating, yoga) Do Not substitute Alcohol to help with tapering If you have been on opioids for less than two weeks and do not have pain than it is ok to stop all together.  Plan to wean off  of opioids This plan should start within one week post op of your joint replacement. Maintain the same interval or time between taking each dose and first decrease the dose.  Cut the total daily intake of opioids by one tablet each day Next start to increase the time between doses. The last dose that should be eliminated is the evening dose.   IF YOU ARE TRANSFERRED TO A SKILLED REHAB FACILITY If the patient is transferred to a skilled rehab facility following release from the hospital, a list of the current medications will be sent to the facility for the patient to continue.  When discharged from the skilled rehab facility, please have the facility set up the patient's [place] prior to being released. Also, the skilled facility will be responsible for providing the patient with their medications at time of release from the facility to include their pain medication, the muscle relaxants, and their blood thinner medication. If the patient is still at the rehab facility at time of the two week follow up appointment, the skilled rehab facility will also need to assist the patient in arranging follow up appointment in our office and any transportation needs.  MAKE SURE YOU:  Understand these instructions.  Get help right away if you are not doing well or get worse.   DENTAL ANTIBIOTICS:  In most cases prophylactic antibiotics for Dental procdeures after total joint surgery are not  necessary.  Exceptions are as follows:  1. History of prior total joint infection  2. Severely immunocompromised (Organ Transplant, cancer chemotherapy, Rheumatoid biologic meds such as [place])  3. Poorly controlled diabetes (A1C &gt; 8.0, blood glucose over 200)  If you have one of these conditions, contact your surgeon for an antibiotic prescription, prior to your dental procedure.    Pick up stool softner and laxative for home use following surgery while on pain medications. Do not submerge incision under water. Please use good hand washing techniques while changing dressing each day. May shower starting three days after surgery. Please use a clean towel to pat the incision dry following showers. Continue to use ice for pain and swelling after surgery. Do not use any lotions or creams on the incision until instructed by your surgeon.  __________________________________________________________________________________________________________________________  Information on my medicine - XARELTO (Rivaroxaban)   Why was Xarelto prescribed for you? Xarelto was prescribed for you to reduce the risk of blood clots forming after orthopedic surgery. The medical term for these abnormal blood clots is venous thromboembolism (VTE).  What do you need to know about xarelto ? Take your Xarelto ONCE DAILY at the same time every day. You may take it either with or without food.  If you have difficulty swallowing the tablet whole, you may crush it and mix in applesauce just prior to taking your dose.  Take Xarelto exactly as prescribed by your doctor and DO NOT stop taking Xarelto without talking to the doctor who prescribed the medication.  Stopping without other VTE prevention medication to take the place of Xarelto may increase your risk of developing a clot.  After discharge, you should have regular check-up appointments with your healthcare provider that is prescribing your  Xarelto.    What do you do if you miss a dose? If you miss a dose, take it as soon as you remember on the same day then continue your regularly scheduled once daily regimen the next day. Do not take two doses of Xarelto on the same day.  Safety Information A possible side effect of Xarelto is bleeding. You should call your healthcare provider right away if you experience any of the following: Bleeding from an injury or your nose that does not stop. Unusual colored urine (red or dark brown) or unusual colored stools (red or black). Unusual bruising for unknown reasons. A serious fall or if you hit your head (even if there is no bleeding).  Some medicines may interact with Xarelto and might increase your risk of bleeding while on Xarelto. To help avoid this, consult your healthcare provider or pharmacist prior to using any new prescription or non-prescription medications, including herbals, vitamins, non-steroidal anti-inflammatory drugs (NSAIDs) and supplements.  This website has more information on Xarelto: www.xarelto.com.    

## 2020-07-14 NOTE — Brief Op Note (Signed)
07/14/2020  3:38 PM  PATIENT:  Shannon [name]  75 y.o. female  PRE-OPERATIVE DIAGNOSIS:  Failed left total knee arthroplasty  POST-OPERATIVE DIAGNOSIS:  Failed left total knee arthroplasty  PROCEDURE:  Procedure(s): TOTAL KNEE REVISION (Left)  SURGEON:  Surgeon(s) and Role:    Gaynelle Arabian, MD - Primary  PHYSICIAN ASSISTANT:   ASSISTANTS: Fenton Foy, PA-C   ANESTHESIA:   spinal  EBL:  300 mL   BLOOD ADMINISTERED:none  DRAINS: (Medium) Hemovact drain(s) in the left knee with  Suction Open   LOCAL MEDICATIONS USED:  OTHER Exparel  COUNTS:  YES  TOURNIQUET:   Total Tourniquet Time Documented: Thigh (Left) - 48 minutes Thigh (Left) - 25 minutes Total: Thigh (Left) - 73 minutes   DICTATION: .Other Dictation: Dictation Number [id number]  PLAN OF CARE: Admit to inpatient   PATIENT DISPOSITION:  PACU - hemodynamically stable.

## 2020-07-14 NOTE — Anesthesia Postprocedure Evaluation (Signed)
Anesthesia Post Note  Patient: Shannon [name]  Procedure(s) Performed: TOTAL KNEE REVISION (Left: Knee)     Patient location during evaluation: PACU Anesthesia Type: Spinal Level of consciousness: awake Pain management: pain level controlled Vital Signs Assessment: post-procedure vital signs reviewed and stable Respiratory status: spontaneous breathing Cardiovascular status: stable Postop Assessment: no apparent nausea or vomiting Anesthetic complications: no   No notable events documented.  Last Vitals:  Vitals:   07/14/20 1735 07/14/20 1736  BP: 115/73   Pulse: 63 74  Resp:  (!) 24  Temp:    SpO2: 99% 100%    Last Pain:  Vitals:   07/14/20 1736  TempSrc:   PainSc: 0-No pain                 Sung [place]

## 2020-07-14 NOTE — Progress Notes (Signed)
Patient asked to take home medication Elmiron 100 mg BID. Paged [facility].

## 2020-07-14 NOTE — Progress Notes (Signed)
Assisted Dr. Howse with left, ultrasound guided, adductor canal block. Side rails up, monitors on throughout procedure. See vital signs in flow sheet. Tolerated Procedure well.  

## 2020-07-14 NOTE — Op Note (Signed)
Shannon [name], Shannon [name] MEDICAL RECORD NO: [id number] ACCOUNT NO: [id number] DATE OF BIRTH: 07-18-1945 FACILITY: [facility] LOCATION: [id number] PHYSICIAN: Dione Plover. Dodger Sinning, MD  Operative Report   DATE OF PROCEDURE: 07/14/2020  PREOPERATIVE DIAGNOSIS:  Failed left total knee arthroplasty.  POSTOPERATIVE DIAGNOSIS:  Failed left total knee arthroplasty.  PROCEDURE:  Left total knee arthroplasty revision.  SURGEON:  Gaynelle Arabian, MD  ASSISTANT:  Fenton Foy, PA-C  TYPE OF ANESTHESIA:  Spinal.  ESTIMATED BLOOD LOSS:  300.  DRAIN:  Hemovac x1.  TOURNIQUET TIME:  Up 46 minutes at 300 mmHg, down 8 minutes, up additional 25 minutes at 300 mmHg.  COMPLICATIONS:  None.  CONDITION:  Stable to recovery.  BRIEF CLINICAL NOTE:  The patient is a 75 year old female with significant left knee pain.  Exam and history demonstrate a loose left total knee arthroplasty.  Infection workup has been negative.  She presents now for left total knee arthroplasty  revision.  DESCRIPTION OF PROCEDURE:  After successful administration of spinal anesthetic, a tourniquet was placed high on the left thigh and left lower extremity prepped and draped in the usual sterile fashion.  The extremity was wrapped in Esmarch, knee flexed,  tourniquet inflated to 300 mmHg.  Midline incision was made with a 10 blade through subcutaneous tissue to the level of the extensor mechanism.  A fresh blade was used to make a medial parapatellar arthrotomy.  Soft tissue over the proximal medial tibia  subperiosteally elevated to the joint line with a knife and into the semimembranosus bursa with a Cobb elevator.  Soft tissue laterally was elevated with attention being paid to avoiding the patellar tendon on the tibial tubercle.  I did a thorough  synovectomy.  There was a tremendous amount of wear debris in the joint and metal stained synovium.  After the synovectomy was completed, then the patella was everted and the knee flexed 90  degrees.  Tibia subluxed forward and the tibial polyethylene removed.  The tibial tray had subsided into varus.  I easily removed the tibial tray.  Circumferential tibial retraction was achieved and the tibia subluxed forward.  The extramedullary tibial alignment  guide was placed, referencing proximally at the medial aspect of the tibial tubercle and distally along the second metatarsal axis and tibial crest.  A block was pinned to remove about 2 mm off the deficient medial side.  Tibial resection was made with  an oscillating saw.  Size 3 was the most appropriate tibial component.  The proximal tibia was prepared with modular drill, then modular drill plus stem extension for the size 3.  We then broached for a 29 mm sleeve.  I then sized for a cement restrictor  and size 4 was the most appropriate.  Size 4 cement restrictor was placed at the appropriate depth in the tibial canal.  The femoral component was then isolated and utilizing osteotomes, the junction between the component and bone is disrupted and the component removed with minimal to no bone loss.  We then removed the cement from the intercondylar area.  The access is  gained to the femoral canal and the canal thoroughly irrigated.  I reamed up to 14 mm, which had an excellent press fit.  We left a 14 mm reamer in place to serve as an intramedullary alignment guide.  I then placed the distal femoral cutting block and  had to cut in the +4 position in order to get any bone.  We thus placed 4 mm augments medial and  lateral.  The size 3 was most appropriate for the femur.  Size 3 cutting block was placed in the +2 position effectively, lowering the component to the anterior  cortex of the femur.  The rotation was marked off the epicondylar axis and confirmed with a rectangular flexion gap with spacer blocks in place with the knee in flexion.  The block was pinned in this rotation.  We had 4 mm distal augments medial and  lateral.  Anterior,  posterior and chamfer cuts were made.  We had to go in a +4 position to get any bone posteriorly, medial and lateral, thus 4 mm augments had to be placed posteriorly, medial and lateral.  Intercondylar block was placed and that made  for the TC3 for the size 3.  The trial was then placed, size 3 TC3 femur with 4 mm posterior augments medial and lateral and 4 mm distal augments medial and lateral.  The stem is a 14 x 75 in the +2 position in 5 degrees of valgus.  On the tibial side,  we have a size 3 MBT revision tibia with 10 mm augments medial and lateral.  A 29 sleeve and a 13 x 30 stem extension.  These trials were both placed in the tibia and femur and then a 15 mm insert was placed.  With the 15, full extension was achieved  with excellent varus/valgus and anterior/posterior balance throughout full range of motion.  Patella tracks normally.  Since the patella was well fixed, we left the patellar component in place and just did a patelloplasty, removing the soft tissue around  the patella.  I then released the tourniquet.  Initial tourniquet time 43 minutes and left it down for 8 minutes while we assembled the components on the back table.  After the 8 minutes, we copiously irrigated the joint with saline solution and removed  the trial implants.  The cement is then mixed which is three batches of gentamicin cement and once ready for implantation, the tibial side was cemented first, which is a size 3 MBT revision tray with 10 mm augments medial and lateral, 29 sleeve and 13 x  30 stem extension, it is impacted and all extruded cement removed.  On the femoral side, we cemented distally, we had a press-fit stem.  Again, it is a size 3 TC3 femur with 4 mm augments medial and lateral distal and 4 mm augments medial and lateral  posterior. A 14 x 75 stem extension in a +2 position and 5 degrees of valgus.  It is impacted and all extruded cement removed.  Trial 15 mm insert was placed and the knee held in full  extension. There was a small amount of varus, valgus play with that,  so we switched to 17.5 trial and had great stability throughout full range of motion.  20 mL of Exparel mixed with 60 mL of saline was injected into the medial retinaculum and extensor mechanism as well as the periosteum of the femur.  The cement was  hardened, then a trial insert was placed and the permanent 17.5 mm TC3 rotating platform insert was placed into the tibial tray.  The knee was reduced with outstanding stability throughout full range of motion.  Wound was copiously irrigated with saline  solution and the arthrotomy closed over a Hemovac drain with running 0 Stratafix suture.  Tourniquet was released second time at 23 minutes.  Minor bleeding was stopped with cautery.  The subcutaneous was closed over second limb  of the Hemovac drain with  interrupted 2-0 Vicryl.  Subcuticular was closed with running 4-0 Monocryl.  Drains were hooked to suction.  Incision cleaned and dried and Steri-Strips and a bulky sterile dressing applied.  She was placed into a knee immobilizer, awakened, and  transported to recovery in stable condition.  Please note that a surgical assistance was of medical necessity for this procedure to do it in a safe and expeditious manner.  Surgical assistance was necessary for retraction of vital ligaments and  neurovascular structures for safe removal of the old implant and for safe and accurate placement of the new implant.   SHW D: 07/14/2020 3:47:57 pm T: 07/14/2020 11:22:00 pm  JOB: [id number]/ [id number]

## 2020-07-14 NOTE — Anesthesia Procedure Notes (Signed)
Anesthesia Regional Block: Adductor canal block   Pre-Anesthetic Checklist: , timeout performed,  Correct Patient, Correct Site, Correct Laterality,  Correct Procedure, Correct Position, site marked,  Risks and benefits discussed,  Pre-op evaluation,  At surgeon's request and post-op pain management  Laterality: Left  Prep: Maximum Sterile Barrier Precautions used, chloraprep       Needles:  Injection technique: Single-shot  Needle Type: Echogenic Stimulator Needle     Needle Length: 9cm  Needle Gauge: 22     Additional Needles:   Procedures:,,,, ultrasound used (permanent image in chart),,    Narrative:  Start time: 07/14/2020 12:57 PM End time: 07/14/2020 1:00 PM Injection made incrementally with aspirations every 5 mL.  Performed by: Personally  Anesthesiologist: Brennan Bailey, MD  Additional Notes: Risks, benefits, and alternative discussed. Patient gave consent for procedure. Patient prepped and draped in sterile fashion. Sedation administered, patient remains easily responsive to voice. Relevant anatomy identified with ultrasound guidance. Local anesthetic given in 5cc increments with no signs or symptoms of intravascular injection. No pain or paraesthesias with injection. Patient monitored throughout procedure with signs of LAST or immediate complications. Tolerated well. Ultrasound image placed in chart.  Tawny Asal, MD

## 2020-07-14 NOTE — Transfer of Care (Signed)
Immediate Anesthesia Transfer of Care Note  Patient: Shannon [name]  Procedure(s) Performed: TOTAL KNEE REVISION (Left: Knee)  Patient Location: PACU  Anesthesia Type:Spinal  Level of Consciousness: awake, alert , oriented and patient cooperative  Airway & Oxygen Therapy: Patient Spontanous Breathing and Patient connected to face mask oxygen  Post-op Assessment: Report given to RN and Post -op Vital signs reviewed and stable  Post vital signs: Reviewed and stable  Last Vitals:  Vitals Value Taken Time  BP 114/85 07/14/20 1620  Temp    Pulse 58 07/14/20 1623  Resp 14 07/14/20 1623  SpO2 100 % 07/14/20 1623  Vitals shown include unvalidated device data.  Last Pain:  Vitals:   07/14/20 1202  TempSrc:   PainSc: 0-No pain      Patients Stated Pain Goal: 4 (94/94/47 3958)  Complications: No notable events documented.

## 2020-07-15 ENCOUNTER — Other Ambulatory Visit: Payer: Self-pay

## 2020-07-15 LAB — CBC
HCT: 30.5 % — ABNORMAL LOW (ref 36.0–46.0)
Hemoglobin: 9.6 g/dL — ABNORMAL LOW (ref 12.0–15.0)
MCH: 29 pg (ref 26.0–34.0)
MCHC: 31.5 g/dL (ref 30.0–36.0)
MCV: 92.1 fL (ref 80.0–100.0)
Platelets: 209 10*3/uL (ref 150–400)
RBC: 3.31 MIL/uL — ABNORMAL LOW (ref 3.87–5.11)
RDW: 13.5 % (ref 11.5–15.5)
WBC: 14.1 10*3/uL — ABNORMAL HIGH (ref 4.0–10.5)
nRBC: 0 % (ref 0.0–0.2)

## 2020-07-15 LAB — BASIC METABOLIC PANEL
Anion gap: 7 (ref 5–15)
BUN: 11 mg/dL (ref 8–23)
CO2: 27 mmol/L (ref 22–32)
Calcium: 8.8 mg/dL — ABNORMAL LOW (ref 8.9–10.3)
Chloride: 106 mmol/L (ref 98–111)
Creatinine, Ser: 0.93 mg/dL (ref 0.44–1.00)
GFR, Estimated: >60 mL/min (ref >60)
Glucose, Bld: 174 mg/dL — ABNORMAL HIGH (ref 70–99)
Potassium: 4 mmol/L (ref 3.5–5.1)
Sodium: 140 mmol/L (ref 135–145)

## 2020-07-15 MED ORDER — PENTOSAN POLYSULFATE SODIUM 100 MG PO CAPS
100.0000 mg | ORAL_CAPSULE | Freq: Two times a day (BID) | ORAL | Status: DC
  Administered 2020-07-15 – 2020-07-16 (×3): 100 mg via ORAL
  Filled 2020-07-15 (×3): qty 1

## 2020-07-15 MED ORDER — LORATADINE 10 MG PO TABS
10.0000 mg | ORAL_TABLET | Freq: Every day | ORAL | Status: DC
  Administered 2020-07-15 – 2020-07-16 (×2): 10 mg via ORAL
  Filled 2020-07-15 (×2): qty 1

## 2020-07-15 NOTE — TOC Initial Note (Addendum)
Transition of Care [facility]) - Initial/Assessment Note   Patient Details  Name: Shannon [name] MRN: [id number] Date of Birth: 10-10-1945  [facility]) CM/SW Contact:    Sherie Don, LCSW Phone Number: 07/15/2020, 2:57 PM  Clinical Narrative: Patient is a 75 year old female who was admitted for failed total left knee replacement. CSW met with patient to review discharge plan. Patient will discharge home with HHPT through [facility] and then transition to [place] through [facility] PT. Patient has a 3N1 and raised toilets at home, but after working with PT a youth rolling walker is recommended. [employer] to deliver the rolling walker to the patient's room. TOC to follow.  Expected Discharge Plan: [place] Barriers to Discharge: Continued Medical Work up  Patient Goals and CMS Choice Patient states their goals for this hospitalization and ongoing recovery are:: Discharge home with HHPT and then transition to [place] through [place] Medicare.gov Compare Post Acute Care list provided to:: Patient Choice offered to / list presented to : Patient  Expected Discharge Plan and Services Expected Discharge Plan: [place] In-house Referral: Clinical Social Work Post Acute Care Choice: [place] arrangements for the past 2 months: [place]              DME Arranged: Environmental consultant youth DME Agency: [employer] Date DME Agency Contacted: 07/15/20 Representative spoke with at DME Agency: [place]: PT [place]: [place] Representative spoke with at [place]: Pre-arranged in orthopedist's office  Prior Living Arrangements/Services Living arrangements for the past 2 months: Single Family Home Lives with:: Spouse Patient language and need for interpreter reviewed:: Yes Do you feel safe going back to the place where you live?: Yes      Need for Family Participation in Patient Care: No (Comment) Care giver support system  in place?: Yes (comment) Criminal Activity/Legal Involvement Pertinent to Current Situation/Hospitalization: No - Comment as needed  Activities of Daily Living Home Assistive Devices/Equipment: None ADL Screening (condition at time of admission) Patient's cognitive ability adequate to safely complete daily activities?: No Is the patient deaf or have difficulty hearing?: No Does the patient have difficulty seeing, even when wearing glasses/contacts?: No Does the patient have difficulty concentrating, remembering, or making decisions?: Yes Patient able to express need for assistance with ADLs?: Yes Does the patient have difficulty dressing or bathing?: Yes Independently performs ADLs?: No Communication: Independent Dressing (OT): Needs assistance Is this a change from baseline?: Pre-admission baseline Grooming: Independent Feeding: Independent Bathing: Needs assistance Is this a change from baseline?: Pre-admission baseline Toileting: Needs assistance Is this a change from baseline?: Pre-admission baseline In/Out Bed: Needs assistance Is this a change from baseline?: Pre-admission baseline Walks in Home: Independent Does the patient have difficulty walking or climbing stairs?: Yes Weakness of Legs: Left Weakness of Arms/Hands: None  Emotional Assessment Appearance:: Appears stated age Attitude/Demeanor/Rapport: Engaged Affect (typically observed): Accepting Orientation: : Oriented to Self, Oriented to Place, Oriented to  Time, Oriented to Situation Alcohol / Substance Use: Not Applicable Psych Involvement: No (comment)  Admission diagnosis:  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] Patient Active Problem List   Diagnosis Date Noted   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   Right knee pain 07/10/2020   Cognitive decline 07/05/2020   Urinary frequency 02/11/2020   Blood in stool 02/11/2020   Other headache syndrome 12/23/2017   Debility 12/23/2017    Left knee pain  12/23/2017   Closed fracture of maxilla, initial encounter ([place]) 10/17/2017   Vitamin D deficiency 06/14/2017   Vertebral fracture, osteoporotic, sequela 04/05/2016   Left-sided back pain 03/06/2016   Chest pain 02/27/2016   Syncope 02/27/2016   Cataracts, bilateral 12/28/2015   Recurrent falls 12/28/2015   Genetic testing 11/29/2015   Family history- stomach cancer 09/08/2015   Family history of cancer 09/08/2015   [name] lesion 06/21/2015   Medicare annual wellness visit, subsequent 12/27/2014   Obesity 09/28/2014   Interstitial cystitis 09/28/2014   Pain in joint, ankle and foot 09/28/2014   Abnormal nuclear stress test 12/16/2013   Sternal pain 11/06/2013   Postoperative anemia due to acute blood loss 02/13/2013   Preop cardiovascular exam 12/30/2012   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   Breast cancer of lower-inner quadrant of right female breast ([place]) 10/21/2012   Insomnia 10/18/2012   UTI (urinary tract infection) 03/03/2012   Constipation 03/03/2012   OA (osteoarthritis) 09/07/2011   Dermatitis    Hyponatremia 08/28/2011   Osteoporosis 03/07/2011   IC (interstitial cystitis)    Melanoma ([place])    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 [place]Phone: [phone]: [phone] [place] (Now [facility]) - [place]Phone: [phone]:  [phone] Readmission Risk Interventions No flowsheet data found.

## 2020-07-15 NOTE — Evaluation (Signed)
Physical Therapy Evaluation Patient Details Name: Shannon [name] MRN: [id number] DOB: Jun 01, 1945 Today's Date: 07/15/2020   History of Present Illness  Pt s/p L TKR revision and wiht hx of breast CA, brain tumor, neuropathy, nonsichemi cardiomyopathy, and vertebral fx  Clinical Impression  Pt admitted as above and presenting with functional mobility limitations 2* decreased L LE strength/ROM and post op pain.  Pt should progress to dc home with family assist.    Follow Up Recommendations Follow surgeon's recommendation for DC plan and follow-up therapies    Equipment Recommendations  Rolling walker with 5" wheels (youth level RW please)    Recommendations for Other Services       Precautions / Restrictions Precautions Precautions: Fall Restrictions Weight Bearing Restrictions: No LLE Weight Bearing: Weight bearing as tolerated      Mobility  Bed Mobility Overal bed mobility: Needs Assistance Bed Mobility: Supine to Sit     Supine to sit: Min assist     General bed mobility comments: cues for sequence and use of R LE to self assist    Transfers Overall transfer level: Needs assistance Equipment used: Rolling walker (2 wheeled) Transfers: Sit to/from Stand Sit to Stand: Min assist         General transfer comment: cues for LE management and use of UEs to self assist  Ambulation/Gait Ambulation/Gait assistance: Min assist;Mod assist Gait Distance (Feet): 45 Feet Assistive device: Rolling walker (2 wheeled) Gait Pattern/deviations: Step-to pattern;Decreased step length - right;Decreased step length - left;Shuffle;Trunk flexed Gait velocity: decr   General Gait Details: cues for sequence, posture and position from [employer]            Wheelchair Mobility    Modified Rankin (Stroke Patients Only)       Balance Overall balance assessment: Needs assistance Sitting-balance support: No upper extremity supported;Feet supported Sitting balance-Leahy  Scale: Good     Standing balance support: Bilateral upper extremity supported Standing balance-Leahy Scale: Poor                               Pertinent Vitals/Pain Pain Assessment: 0-10 Pain Score: 6  Pain Location: L knee Pain Descriptors / Indicators: Aching;Sore Pain Intervention(s): Limited activity within patient's tolerance;Monitored during session;Premedicated before session;Ice applied    Home Living Family/patient expects to be discharged to:: Private residence Living Arrangements: Spouse/significant other Available Help at Discharge: Family;Available 24 hours/day Type of Home: House Home Access: Stairs to enter Entrance Stairs-Rails: Right Entrance Stairs-Number of Steps: 3 Home Layout: Multi-level;Able to live on main level with bedroom/bathroom Home Equipment: Kasandra Knudsen - single point;Bedside commode      Prior Function Level of Independence: Independent               Hand Dominance   Dominant Hand: Right    Extremity/Trunk Assessment   Upper Extremity Assessment Upper Extremity Assessment: Overall WFL for tasks assessed    Lower Extremity Assessment Lower Extremity Assessment: LLE deficits/detail LLE Deficits / Details: 3-/5 quads with AAROM at knee -5 - 45       Communication   Communication: No difficulties  Cognition Arousal/Alertness: Awake/alert Behavior During Therapy: WFL for tasks assessed/performed Overall Cognitive Status: Within Functional Limits for tasks assessed  General Comments      Exercises Total Joint Exercises Ankle Circles/Pumps: AROM;Both;15 reps;Supine Quad Sets: AROM;Both;10 reps;Supine Heel Slides: AAROM;Left;15 reps;Supine Straight Leg Raises: AAROM;Left;10 reps;Supine   Assessment/Plan    PT Assessment Patient needs continued PT services  PT Problem List Decreased strength;Decreased range of motion;Decreased activity tolerance;Decreased  balance;Decreased mobility;Decreased knowledge of use of DME;Pain       PT Treatment Interventions DME instruction;Gait training;Stair training;Functional mobility training;Therapeutic activities;Therapeutic exercise;Patient/family education;Balance training    PT Goals (Current goals can be found in the Care Plan section)  Acute Rehab PT Goals Patient Stated Goal: Regain IND PT Goal Formulation: With patient Time For Goal Achievement: 07/22/20 Potential to Achieve Goals: Good    Frequency 7X/week   Barriers to discharge        Co-evaluation               AM-PAC PT "6 Clicks" Mobility  Outcome Measure Help needed turning from your back to your side while in a flat bed without using bedrails?: A Little Help needed moving from lying on your back to sitting on the side of a flat bed without using bedrails?: A Little Help needed moving to and from a bed to a chair (including a wheelchair)?: A Little Help needed standing up from a chair using your arms (e.g., wheelchair or bedside chair)?: A Little Help needed to walk in hospital room?: A Little Help needed climbing 3-5 steps with a railing? : A Lot 6 Click Score: 17    End of Session Equipment Utilized During Treatment: Gait belt;Left knee immobilizer Activity Tolerance: Patient tolerated treatment well Patient left: in chair;with call bell/phone within reach;with chair alarm set Nurse Communication: Mobility status PT Visit Diagnosis: Difficulty in walking, not elsewhere classified (R26.2)    Time: 2820-8138 PT Time Calculation (min) (ACUTE ONLY): 43 min   Charges:   PT Evaluation $PT Eval Low Complexity: 1 Low PT Treatments $Gait Training: 8-22 mins $Therapeutic Exercise: 8-22 mins        [place] Pager [phone] Office [phone]   Fatina Sprankle 07/15/2020, 12:54 PM

## 2020-07-15 NOTE — Progress Notes (Signed)
Subjective: 1 Day Post-Op Procedure(s) (LRB): TOTAL KNEE REVISION (Left) Patient reports pain as moderate.   Patient seen in rounds for Dr. Wynelle Link. Patient is having issues with allergies this AM, normally takes Zyrtec for this at home. Pain is controlled with medications currently. No issues overnight, foley catheter to be removed this AM. Denies chest pain, SOB, or calf pain.  Plan is to go Home after hospital stay.  Objective: Vital signs in last 24 hours: Temp:  [97.7 F (36.5 C)-98.2 F (36.8 C)] 98.2 F (36.8 C) (06/16 0434) Pulse Rate:  [52-74] 62 (06/16 0434) Resp:  [9-24] 16 (06/16 0434) BP: (111-160)/(52-86) 146/63 (06/16 0434) SpO2:  [93 %-100 %] 93 % (06/16 0434) Weight:  [77.6 kg] 77.6 kg (06/15 1202)  Intake/Output from previous day:  Intake/Output Summary (Last 24 hours) at 07/15/2020 0752 Last data filed at 07/15/2020 0700 Gross per 24 hour  Intake 2588.64 ml  Output 2715 ml  Net -126.36 ml    Intake/Output this shift: No intake/output data recorded.  Labs: Recent Labs    07/15/20 0323  HGB 9.6*   Recent Labs    07/15/20 0323  WBC 14.1*  RBC 3.31*  HCT 30.5*  PLT 209   Recent Labs    07/15/20 0323  NA 140  K 4.0  CL 106  CO2 27  BUN 11  CREATININE 0.93  GLUCOSE 174*  CALCIUM 8.8*   Recent Labs    07/12/20 1357  INR 1.0    Exam: General - Patient is Alert and Oriented Extremity - Neurologically intact Neurovascular intact Sensation intact distally Dorsiflexion/Plantar flexion intact Dressing/Incision - clean, dry, no drainage. Ace wrap and gauze dressing in place. Hemovac in place. Motor Function - intact, moving foot and toes well on exam.   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/2009   BRCA 2 NEGATIVE, CHEMO AND RADIATION X 1 YR   HYPERLIPIDEMIA 08/11/2008   HYPERTENSION 08/11/2008   Hyponatremia 08/28/2011   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    Melanoma ([place]) 2008   DR. Ronnald Ramp   Neuropathy    NICM (nonischemic cardiomyopathy) ([place]) 03/08/2010   likely 2/2 chemotx - a. Echo 2012: EF 45-50%;  b. Lex MV 2/12:  low risk, apical defect (small area of ischemia vs shifting breast atten);  c.  Echo 7/12: Normal wall thickness, EF 60-65%, normal wall motion, grade 1 diastolic dysfunction, mild LAE,  PASP 32;   d. Lex MV 11/13:  EF 76%, no ischemia   OA (osteoarthritis) 09/07/2011   Obesity 09/28/2014   Osteoporosis    Personal history of chemotherapy 2012   Personal history of radiation therapy 2012   PREDIABETES 06/15/2009   Recurrent falls 12/28/2015   Shortness of breath    UTI (lower urinary tract infection) 03/03/2012   Vertebral fracture, osteoporotic, sequela 04/05/2016    Assessment/Plan: 1 Day Post-Op Procedure(s) (LRB): TOTAL KNEE REVISION (Left) Principal Problem:   Failed total left knee replacement (HCC)  Estimated body mass index is 31.28 kg/m as calculated from the following:   Height as of this encounter: '5\' 2"'  (1.575 m).   Weight as of this encounter: 77.6 kg. Advance diet Up with therapy D/C IV  fluids  DVT Prophylaxis - Xarelto Weight-bearing as tolerated Begin therapy.  Claritin ordered for allergy relief.  Hemovac drain to stay in until tomorrow AM.  Plan for discharge tomorrow around lunchtime if meeting goals with therapy.  Theresa Duty, PA-C Orthopedic Surgery [phone] 07/15/2020, 7:52 AM

## 2020-07-15 NOTE — Progress Notes (Signed)
Physical Therapy Treatment Patient Details Name: Shannon [name] MRN: [id number] DOB: 06-29-1945 Today's Date: 07/15/2020    History of Present Illness Pt s/p L TKR revision and wiht hx of breast CA, brain tumor, neuropathy, nonsichemi cardiomyopathy, and vertebral fx    PT Comments    Pt very motivated and progressing well with mobility.  Pt hopeful for dc home tomorrow.   Follow Up Recommendations  Follow surgeon's recommendation for DC plan and follow-up therapies     Equipment Recommendations  Rolling walker with 5" wheels    Recommendations for Other Services       Precautions / Restrictions Precautions Precautions: Fall Restrictions Weight Bearing Restrictions: No LLE Weight Bearing: Weight bearing as tolerated    Mobility  Bed Mobility Overal bed mobility: Needs Assistance Bed Mobility: Sit to Supine       Sit to supine: Min guard   General bed mobility comments: cues for sequence and use of R LE to self assist    Transfers Overall transfer level: Needs assistance Equipment used: Rolling walker (2 wheeled) Transfers: Sit to/from Stand Sit to Stand: Min guard         General transfer comment: cues for LE management and use of UEs to self assist  Ambulation/Gait Ambulation/Gait assistance: Min assist Gait Distance (Feet): 138 Feet Assistive device: Rolling walker (2 wheeled) Gait Pattern/deviations: Step-to pattern;Decreased step length - right;Decreased step length - left;Shuffle;Trunk flexed Gait velocity: decr   General Gait Details: cues for sequence, posture and position from RW   Stairs Stairs: Yes Stairs assistance: Min assist Stair Management: No rails;Step to pattern;Backwards;With walker Number of Stairs: 1 General stair comments: single step bkwd with cues for sequence   Wheelchair Mobility    Modified Rankin (Stroke Patients Only)       Balance Overall balance assessment: Needs assistance Sitting-balance support: No upper  extremity supported;Feet supported Sitting balance-Leahy Scale: Good     Standing balance support: Bilateral upper extremity supported Standing balance-Leahy Scale: Fair                              Cognition Arousal/Alertness: Awake/alert Behavior During Therapy: WFL for tasks assessed/performed Overall Cognitive Status: Within Functional Limits for tasks assessed                                        Exercises      General Comments        Pertinent Vitals/Pain Pain Assessment: 0-10 Pain Score: 5  Pain Location: L knee Pain Descriptors / Indicators: Aching;Sore Pain Intervention(s): Limited activity within patient's tolerance;Monitored during session;Premedicated before session;Ice applied    Home Living                      Prior Function            PT Goals (current goals can now be found in the care plan section) Acute Rehab PT Goals Patient Stated Goal: Regain IND PT Goal Formulation: With patient Time For Goal Achievement: 07/22/20 Potential to Achieve Goals: Good Progress towards PT goals: Progressing toward goals    Frequency    7X/week      PT Plan Current plan remains appropriate    Co-evaluation              AM-PAC PT "6 Clicks" Mobility   Outcome  Measure  Help needed turning from your back to your side while in a flat bed without using bedrails?: A Little Help needed moving from lying on your back to sitting on the side of a flat bed without using bedrails?: A Little Help needed moving to and from a bed to a chair (including a wheelchair)?: A Little Help needed standing up from a chair using your arms (e.g., wheelchair or bedside chair)?: A Little Help needed to walk in hospital room?: A Little Help needed climbing 3-5 steps with a railing? : A Little 6 Click Score: 18    End of Session Equipment Utilized During Treatment: Gait belt;Left knee immobilizer Activity Tolerance: Patient tolerated  treatment well Patient left: in bed;with call bell/phone within reach;with bed alarm set Nurse Communication: Mobility status PT Visit Diagnosis: Difficulty in walking, not elsewhere classified (R26.2)     Time: 5102-5852 PT Time Calculation (min) (ACUTE ONLY): 25 min  Charges:  $Gait Training: 8-22 mins $Therapeutic Activity: 8-22 mins                     [place] Pager [phone] Office [phone]    Trudi Morgenthaler 07/15/2020, 3:52 PM

## 2020-07-16 ENCOUNTER — Encounter (HOSPITAL_COMMUNITY): Payer: Self-pay | Admitting: Orthopedic Surgery

## 2020-07-16 LAB — BASIC METABOLIC PANEL
Anion gap: 5 (ref 5–15)
BUN: 11 mg/dL (ref 8–23)
CO2: 28 mmol/L (ref 22–32)
Calcium: 8.9 mg/dL (ref 8.9–10.3)
Chloride: 110 mmol/L (ref 98–111)
Creatinine, Ser: 0.9 mg/dL (ref 0.44–1.00)
GFR, Estimated: >60 mL/min (ref >60)
Glucose, Bld: 169 mg/dL — ABNORMAL HIGH (ref 70–99)
Potassium: 3.7 mmol/L (ref 3.5–5.1)
Sodium: 143 mmol/L (ref 135–145)

## 2020-07-16 LAB — CBC
HCT: 26.8 % — ABNORMAL LOW (ref 36.0–46.0)
Hemoglobin: 8.3 g/dL — ABNORMAL LOW (ref 12.0–15.0)
MCH: 29.3 pg (ref 26.0–34.0)
MCHC: 31 g/dL (ref 30.0–36.0)
MCV: 94.7 fL (ref 80.0–100.0)
Platelets: 185 10*3/uL (ref 150–400)
RBC: 2.83 MIL/uL — ABNORMAL LOW (ref 3.87–5.11)
RDW: 14.2 % (ref 11.5–15.5)
WBC: 13.8 10*3/uL — ABNORMAL HIGH (ref 4.0–10.5)
nRBC: 0 % (ref 0.0–0.2)

## 2020-07-16 MED ORDER — TRAMADOL HCL 50 MG PO TABS: 50.0000 mg | ORAL_TABLET | Freq: Four times a day (QID) | ORAL | Status: DC

## 2020-07-16 MED ORDER — RIVAROXABAN 10 MG PO TABS: 10.0000 mg | ORAL_TABLET | Freq: Every day | ORAL | 0 refills | Status: DC

## 2020-07-16 MED ORDER — TRAMADOL HCL 50 MG PO TABS: 50.0000 mg | ORAL_TABLET | Freq: Four times a day (QID) | ORAL | 0 refills | Status: DC | PRN

## 2020-07-16 MED ORDER — ACETAMINOPHEN 500 MG PO TABS
500.0000 mg | ORAL_TABLET | Freq: Three times a day (TID) | ORAL | Status: DC | PRN
  Administered 2020-07-16: 1000 mg via ORAL
  Filled 2020-07-16: qty 2

## 2020-07-16 MED ORDER — TRAMADOL HCL 50 MG PO TABS
50.0000 mg | ORAL_TABLET | Freq: Four times a day (QID) | ORAL | Status: DC | PRN
  Administered 2020-07-16: 50 mg via ORAL
  Filled 2020-07-16: qty 1

## 2020-07-16 MED ORDER — ACETAMINOPHEN 500 MG PO TABS: 500.0000 mg | ORAL_TABLET | Freq: Three times a day (TID) | ORAL | 2 refills | Status: AC | PRN

## 2020-07-16 MED ORDER — METHOCARBAMOL 500 MG PO TABS: 500.0000 mg | ORAL_TABLET | Freq: Four times a day (QID) | ORAL | 0 refills | Status: DC | PRN

## 2020-07-16 MED ORDER — ACETAMINOPHEN 500 MG PO TABS: 1000.0000 mg | ORAL_TABLET | Freq: Three times a day (TID) | ORAL | Status: DC | PRN

## 2020-07-16 NOTE — Progress Notes (Signed)
Physical Therapy Treatment Patient Details Name: Shannon [name] MRN: [id number] DOB: [birth year] Today's Date: 07/16/2020    History of Present Illness Pt s/p L TKR revision and wiht hx of breast CA, brain tumor, neuropathy, nonsichemi cardiomyopathy, and vertebral fx    PT Comments    Pt up to ambulate limited distance in hall and negotiate stairs with dtr present.  Pt with improved stability from this am but continues impulsive and requiring frequent cueing for safety, technique and focus on task.  Pt min assist for all mobility tasks and dtr states "we can handle this" and assures pt will have 24/7 by family or paid in home assist.   Follow Up Recommendations  Follow surgeon's recommendation for DC plan and follow-up therapies     Equipment Recommendations  Rolling walker with 5" wheels    Recommendations for Other Services       Precautions / Restrictions Precautions Precautions: Fall Restrictions Weight Bearing Restrictions: No LLE Weight Bearing: Weight bearing as tolerated    Mobility  Bed Mobility Overal bed mobility: Needs Assistance Bed Mobility: Sit to Supine       Sit to supine: Min assist   General bed mobility comments: cues for sequence and min assist for L LE    Transfers Overall transfer level: Needs assistance Equipment used: Rolling walker (2 wheeled) Transfers: Sit to/from Stand Sit to Stand: Min guard         General transfer comment: Steady assist with cues for LE management and use of UEs to self assist;  Ambulation/Gait Ambulation/Gait assistance: Min assist;Min guard Gait Distance (Feet): 75 Feet Assistive device: Rolling walker (2 wheeled) Gait Pattern/deviations: Step-to pattern;Decreased step length - right;Decreased step length - left;Shuffle;Trunk flexed Gait velocity: decr   General Gait Details: cues for sequence, posture, stride length, foot placement and position from RW   Stairs Stairs: Yes Stairs assistance: Min  assist Stair Management: One rail Right;Step to pattern;Forwards;With crutches Number of Stairs: 2 General stair comments: cues for sequence, foot/crutch placement and to slow down for safety   Wheelchair Mobility    Modified Rankin (Stroke Patients Only)       Balance Overall balance assessment: Needs assistance Sitting-balance support: No upper extremity supported;Feet supported Sitting balance-Leahy Scale: Good     Standing balance support: Bilateral upper extremity supported Standing balance-Leahy Scale: Poor                              Cognition Arousal/Alertness: Awake/alert Behavior During Therapy: WFL for tasks assessed/performed;Impulsive Overall Cognitive Status: Within Functional Limits for tasks assessed                                 General Comments: Pt very fatigued after night of confusion and little sleep      Exercises Total Joint Exercises Ankle Circles/Pumps: AROM;Both;15 reps;Supine Quad Sets: AROM;Both;10 reps;Supine Heel Slides: AAROM;Left;Supine;10 reps Straight Leg Raises: AAROM;Left;10 reps;Supine    General Comments        Pertinent Vitals/Pain Pain Assessment: Faces Faces Pain Scale: Hurts whole lot Pain Location: L knee Pain Descriptors / Indicators: Aching;Grimacing;Guarding;Sore Pain Intervention(s): Limited activity within patient's tolerance;Monitored during session;Premedicated before session;Ice applied    Home Living                      Prior Function  PT Goals (current goals can now be found in the care plan section) Acute Rehab PT Goals Patient Stated Goal: Regain IND PT Goal Formulation: With patient Time For Goal Achievement: 07/22/20 Potential to Achieve Goals: Good Progress towards PT goals: Progressing toward goals    Frequency    7X/week      PT Plan Current plan remains appropriate    Co-evaluation              AM-PAC PT "6 Clicks" Mobility    Outcome Measure  Help needed turning from your back to your side while in a flat bed without using bedrails?: A Little Help needed moving from lying on your back to sitting on the side of a flat bed without using bedrails?: A Little Help needed moving to and from a bed to a chair (including a wheelchair)?: A Little Help needed standing up from a chair using your arms (e.g., wheelchair or bedside chair)?: A Little Help needed to walk in hospital room?: A Little Help needed climbing 3-5 steps with a railing? : A Little 6 Click Score: 18    End of Session Equipment Utilized During Treatment: Gait belt;Left knee immobilizer Activity Tolerance: Patient limited by pain Patient left: in bed;with call bell/phone within reach;with family/visitor present Nurse Communication: Mobility status PT Visit Diagnosis: Difficulty in walking, not elsewhere classified (R26.2)     Time: 2876-8115 PT Time Calculation (min) (ACUTE ONLY): 21 min  Charges:  $Gait Training: 8-22 mins $Therapeutic Exercise: 8-22 mins                     Debe Coder PT Acute Rehabilitation Services Pager [phone] Office [phone]    Azazel Franze 07/16/2020, 1:04 PM

## 2020-07-16 NOTE — Progress Notes (Signed)
Gave pt Klonopin 0.5 mg. Pt was constantly getting out of bed still, pt removed hemovac. Applied abd dressing and reapplied new compression wrap. Called the ongoing provider for Dr. Wynelle Link at [phone]. PA Estill Bamberg gave order to give another dose of Klonopin 0.5 mg, no other orders.

## 2020-07-16 NOTE — Progress Notes (Addendum)
Subjective: 2 Days Post-Op Procedure(s) (LRB): TOTAL KNEE REVISION (Left) Patient reports pain as mild.   Patient seen in rounds for Dr. Wynelle Link. Patient had issues with confusion last night, per provider on call patient was agitated and combative. This AM, patient is confused and agitated, asking for her daughter (no longer combative). Reports minimal pain in the knee.   Objective: Vital signs in last 24 hours: Temp:  [97.7 F (36.5 C)-98.2 F (36.8 C)] 98.1 F (36.7 C) (06/17 0459) Pulse Rate:  [73-86] 73 (06/17 0459) Resp:  [16-18] 16 (06/17 0459) BP: (121-157)/(63-83) 157/75 (06/17 0459) SpO2:  [96 %-100 %] 100 % (06/17 0459)  Intake/Output from previous day:  Intake/Output Summary (Last 24 hours) at 07/16/2020 0731 Last data filed at 07/15/2020 2155 Gross per 24 hour  Intake 212.45 ml  Output 1050 ml  Net -837.55 ml    Intake/Output this shift: No intake/output data recorded.  Labs: Recent Labs    07/15/20 0323 07/16/20 0322  HGB 9.6* 8.3*   Recent Labs    07/15/20 0323 07/16/20 0322  WBC 14.1* 13.8*  RBC 3.31* 2.83*  HCT 30.5* 26.8*  PLT 209 185   Recent Labs    07/15/20 0323 07/16/20 0322  NA 140 143  K 4.0 3.7  CL 106 110  CO2 27 28  BUN 11 11  CREATININE 0.93 0.90  GLUCOSE 174* 169*  CALCIUM 8.8* 8.9   No results for input(s): LABPT, INR in the last 72 hours.  Exam: General - Patient is Alert and Oriented Extremity - Neurologically intact Neurovascular intact Sensation intact distally Dorsiflexion/Plantar flexion intact Dressing/Incision - clean, dry, no drainage. Hemavac drain site with no active drainage. Ace wrap and gauze removed and new Aquacel applied.  Motor Function - intact, moving foot and toes well on exam.   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   Anemia 08/28/2011   Asthma    as a child   Brain tumor ([place])    Breast cancer ([place])    right breast   Carpal tunnel  syndrome of left wrist    Cataracts, bilateral 12/28/2015   Constipation 03/03/2012   DEPRESSION 08/11/2008   Dermatitis    Facial fracture ([place])    GERD 08/11/2008   H/O measles    H/O mumps    History of blood transfusion    History of chicken pox    History of hiatal hernia    Hx breast cancer, IDC, Right, 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/2009   BRCA 2 NEGATIVE, CHEMO AND RADIATION X 1 YR   HYPERLIPIDEMIA 08/11/2008   HYPERTENSION 08/11/2008   Hyponatremia 08/28/2011   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    Melanoma ([place]) 2008   DR. Ronnald Ramp   Neuropathy    NICM (nonischemic cardiomyopathy) ([place]) 03/08/2010   likely 2/2 chemotx - a. Echo 2012: EF 45-50%;  b. Lex MV 2/12:  low risk, apical defect (small area of ischemia vs shifting breast atten);  c.  Echo 7/12: Normal wall thickness, EF 60-65%, normal wall  motion, grade 1 diastolic dysfunction, mild LAE, PASP 32;   d. Lex MV 11/13:  EF 76%, no ischemia   OA (osteoarthritis) 09/07/2011   Obesity 09/28/2014   Osteoporosis    Personal history of chemotherapy 2012   Personal history of radiation therapy 2012   PREDIABETES 06/15/2009   Recurrent falls 12/28/2015   Shortness of breath    UTI (lower urinary tract infection) 03/03/2012   Vertebral fracture, osteoporotic, sequela 04/05/2016    Assessment/Plan: 2 Days Post-Op Procedure(s) (LRB): TOTAL KNEE REVISION (Left) Principal Problem:   Failed total left knee replacement (HCC)  Estimated body mass index is 31.28 kg/m as calculated from the following:   Height as of this encounter: '5\' 2"'  (1.575 m).   Weight as of  this encounter: 77.6 kg. Up with therapy  DVT Prophylaxis - Xarelto Weight-bearing as tolerated  Discontinuing oxycodone due to confusion, was given 10 mg at 9pm last night. Per nursing, patients family has been contacted and are currently on the way to see patient. She was meeting goals with PT yesterday and knee looks great today.   Hemovac drain came out overnight  Once she is back to cognitive baseline, can discharge home.  HHPT order placed yesterday  Theresa Duty, PA-C Orthopedic Surgery [phone] 07/16/2020, 7:31 AM

## 2020-07-16 NOTE — Progress Notes (Signed)
Pt was alert, awake and oriented to person and disoriented to time, place and situation. Pt stated she was in church and needed to get in touch with the priest. Pt was reoriented that she was at [facility] and it was 0500. Pt getting out of bed mulitple times, pt was educated to stay in bed and call out if she needed to get up. Pt was noncompliant and pt started yelling "I want to go home" she was combative and had to call security at 0545. Called pts husband and son to notify. Paged the on call provider at [phone]. No call back.---V. Doran Stabler, RN

## 2020-07-16 NOTE — Progress Notes (Signed)
Physical Therapy Treatment Patient Details Name: Shannon [name] MRN: [id number] DOB: November 18, 1945 Today's Date: 07/16/2020    History of Present Illness Pt s/p L TKR revision and wiht hx of breast CA, brain tumor, neuropathy, nonsichemi cardiomyopathy, and vertebral fx    PT Comments    Pt continues cooperative but limited this am by fatigue and pain after long night of increased confusion.  Pt with noted increased impulsivity and deterioration in ambulatory balance and safety awareness.   Follow Up Recommendations  Follow surgeon's recommendation for DC plan and follow-up therapies     Equipment Recommendations  Rolling walker with 5" wheels    Recommendations for Other Services       Precautions / Restrictions Precautions Precautions: Fall Restrictions Weight Bearing Restrictions: No LLE Weight Bearing: Weight bearing as tolerated    Mobility  Bed Mobility               General bed mobility comments: Pt up in chair and requests back to same    Transfers Overall transfer level: Needs assistance Equipment used: Rolling walker (2 wheeled) Transfers: Sit to/from Stand Sit to Stand: Min assist         General transfer comment: cues for LE management and use of UEs to self assist; physical assist to bring wt up and fwd and to balance in initial standing  Ambulation/Gait Ambulation/Gait assistance: Min assist Gait Distance (Feet): 10 Feet Assistive device: Rolling walker (2 wheeled) Gait Pattern/deviations: Step-to pattern;Decreased step length - right;Decreased step length - left;Shuffle;Trunk flexed Gait velocity: decr   General Gait Details: cues for sequence, posture, stride length, foot placement and position from [employer]             Wheelchair Mobility    Modified Rankin (Stroke Patients Only)       Balance Overall balance assessment: Needs assistance Sitting-balance support: No upper extremity supported;Feet supported Sitting  balance-Leahy Scale: Good     Standing balance support: Bilateral upper extremity supported Standing balance-Leahy Scale: Poor                              Cognition Arousal/Alertness: Awake/alert Behavior During Therapy: WFL for tasks assessed/performed;Impulsive Overall Cognitive Status: Within Functional Limits for tasks assessed                                 General Comments: Pt very fatigued after night of confusion and little sleep      Exercises Total Joint Exercises Ankle Circles/Pumps: AROM;Both;15 reps;Supine Quad Sets: AROM;Both;10 reps;Supine Heel Slides: AAROM;Left;Supine;10 reps Straight Leg Raises: AAROM;Left;10 reps;Supine    General Comments        Pertinent Vitals/Pain Pain Assessment: Faces Faces Pain Scale: Hurts whole lot Pain Location: L knee Pain Descriptors / Indicators: Aching;Grimacing;Guarding;Sore Pain Intervention(s): Limited activity within patient's tolerance;Monitored during session;Premedicated before session;Patient requesting pain meds-RN notified;Ice applied (Tyelenol and Muscle relaxor only)    Home Living                      Prior Function            PT Goals (current goals can now be found in the care plan section) Acute Rehab PT Goals Patient Stated Goal: Regain IND PT Goal Formulation: With patient Time For Goal Achievement: 07/22/20 Potential to Achieve Goals: Good Progress towards PT goals: Not progressing  toward goals - comment (ltd by pain and fatigue)    Frequency    7X/week      PT Plan Current plan remains appropriate    Co-evaluation              AM-PAC PT "6 Clicks" Mobility   Outcome Measure  Help needed turning from your back to your side while in a flat bed without using bedrails?: A Little Help needed moving from lying on your back to sitting on the side of a flat bed without using bedrails?: A Little Help needed moving to and from a bed to a chair  (including a wheelchair)?: A Little Help needed standing up from a chair using your arms (e.g., wheelchair or bedside chair)?: A Little Help needed to walk in hospital room?: A Little Help needed climbing 3-5 steps with a railing? : A Little 6 Click Score: 18    End of Session Equipment Utilized During Treatment: Gait belt;Left knee immobilizer Activity Tolerance: Patient limited by pain Patient left: in chair;with call bell/phone within reach;with family/visitor present Nurse Communication: Mobility status PT Visit Diagnosis: Difficulty in walking, not elsewhere classified (R26.2)     Time: 9381-0175 PT Time Calculation (min) (ACUTE ONLY): 28 min  Charges:  $Gait Training: 8-22 mins $Therapeutic Exercise: 8-22 mins                     [place] Pager [phone] Office [phone]    Enzio Buchler 07/16/2020, 11:43 AM

## 2020-07-17 DIAGNOSIS — E669 Obesity, unspecified: Secondary | ICD-10-CM | POA: Diagnosis not present

## 2020-07-17 DIAGNOSIS — M810 Age-related osteoporosis without current pathological fracture: Secondary | ICD-10-CM | POA: Diagnosis not present

## 2020-07-17 DIAGNOSIS — Z7901 Long term (current) use of anticoagulants: Secondary | ICD-10-CM | POA: Diagnosis not present

## 2020-07-17 DIAGNOSIS — G4700 Insomnia, unspecified: Secondary | ICD-10-CM | POA: Diagnosis not present

## 2020-07-17 DIAGNOSIS — D649 Anemia, unspecified: Secondary | ICD-10-CM | POA: Diagnosis not present

## 2020-07-17 DIAGNOSIS — I428 Other cardiomyopathies: Secondary | ICD-10-CM | POA: Diagnosis not present

## 2020-07-17 DIAGNOSIS — M7062 Trochanteric bursitis, left hip: Secondary | ICD-10-CM | POA: Diagnosis not present

## 2020-07-17 DIAGNOSIS — Z8603 Personal history of neoplasm of uncertain behavior: Secondary | ICD-10-CM | POA: Diagnosis not present

## 2020-07-17 DIAGNOSIS — J45909 Unspecified asthma, uncomplicated: Secondary | ICD-10-CM | POA: Diagnosis not present

## 2020-07-17 DIAGNOSIS — I420 Dilated cardiomyopathy: Secondary | ICD-10-CM | POA: Diagnosis not present

## 2020-07-17 DIAGNOSIS — K5900 Constipation, unspecified: Secondary | ICD-10-CM | POA: Diagnosis not present

## 2020-07-17 DIAGNOSIS — R419 Unspecified symptoms and signs involving cognitive functions and awareness: Secondary | ICD-10-CM | POA: Diagnosis not present

## 2020-07-17 DIAGNOSIS — G629 Polyneuropathy, unspecified: Secondary | ICD-10-CM | POA: Diagnosis not present

## 2020-07-17 DIAGNOSIS — E782 Mixed hyperlipidemia: Secondary | ICD-10-CM | POA: Diagnosis not present

## 2020-07-17 DIAGNOSIS — G4489 Other headache syndrome: Secondary | ICD-10-CM | POA: Diagnosis not present

## 2020-07-17 DIAGNOSIS — R32 Unspecified urinary incontinence: Secondary | ICD-10-CM | POA: Diagnosis not present

## 2020-07-17 DIAGNOSIS — K449 Diaphragmatic hernia without obstruction or gangrene: Secondary | ICD-10-CM | POA: Diagnosis not present

## 2020-07-17 DIAGNOSIS — I10 Essential (primary) hypertension: Secondary | ICD-10-CM | POA: Diagnosis not present

## 2020-07-17 DIAGNOSIS — K219 Gastro-esophageal reflux disease without esophagitis: Secondary | ICD-10-CM | POA: Diagnosis not present

## 2020-07-17 DIAGNOSIS — R7303 Prediabetes: Secondary | ICD-10-CM | POA: Diagnosis not present

## 2020-07-17 DIAGNOSIS — H269 Unspecified cataract: Secondary | ICD-10-CM | POA: Diagnosis not present

## 2020-07-17 DIAGNOSIS — T84093D Other mechanical complication of internal left knee prosthesis, subsequent encounter: Secondary | ICD-10-CM | POA: Diagnosis not present

## 2020-07-17 DIAGNOSIS — Z853 Personal history of malignant neoplasm of breast: Secondary | ICD-10-CM | POA: Diagnosis not present

## 2020-07-17 DIAGNOSIS — F418 Other specified anxiety disorders: Secondary | ICD-10-CM | POA: Diagnosis not present

## 2020-07-17 DIAGNOSIS — Z85820 Personal history of malignant melanoma of skin: Secondary | ICD-10-CM | POA: Diagnosis not present

## 2020-07-19 ENCOUNTER — Other Ambulatory Visit (HOSPITAL_BASED_OUTPATIENT_CLINIC_OR_DEPARTMENT_OTHER): Payer: Medicare Other

## 2020-07-19 DIAGNOSIS — G629 Polyneuropathy, unspecified: Secondary | ICD-10-CM | POA: Diagnosis not present

## 2020-07-19 DIAGNOSIS — E669 Obesity, unspecified: Secondary | ICD-10-CM | POA: Diagnosis not present

## 2020-07-19 DIAGNOSIS — I10 Essential (primary) hypertension: Secondary | ICD-10-CM | POA: Diagnosis not present

## 2020-07-19 DIAGNOSIS — J45909 Unspecified asthma, uncomplicated: Secondary | ICD-10-CM | POA: Diagnosis not present

## 2020-07-19 DIAGNOSIS — M810 Age-related osteoporosis without current pathological fracture: Secondary | ICD-10-CM | POA: Diagnosis not present

## 2020-07-19 DIAGNOSIS — T84093D Other mechanical complication of internal left knee prosthesis, subsequent encounter: Secondary | ICD-10-CM | POA: Diagnosis not present

## 2020-07-19 NOTE — Discharge Summary (Signed)
Physician Discharge Summary   Patient ID: Shannon [name] MRN: [id number] DOB/AGE: 04-Jun-1945 75 y.o.  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   Anemia 08/28/2011   Asthma    as a child   Brain tumor ([place])    Breast cancer ([place])    right breast   Carpal tunnel syndrome of left wrist    Cataracts, bilateral 12/28/2015   Constipation 03/03/2012   DEPRESSION 08/11/2008   Dermatitis    Facial fracture ([place])    GERD 08/11/2008   H/O measles    H/O mumps    History of blood transfusion    History of chicken pox    History of hiatal hernia    Hx breast cancer, IDC, Right, 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/2009   BRCA 2 NEGATIVE, CHEMO AND RADIATION X 1 YR   HYPERLIPIDEMIA 08/11/2008   HYPERTENSION 08/11/2008   Hyponatremia 08/28/2011   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    Melanoma ([place]) 2008   DR. Ronnald Ramp   Neuropathy    NICM (nonischemic cardiomyopathy) ([place]) 03/08/2010   likely 2/2 chemotx - a. Echo 2012: EF 45-50%;  b. Lex MV 2/12:  low risk, apical defect (small area of ischemia vs shifting breast atten);  c.  Echo 7/12: Normal wall thickness, EF 60-65%, normal wall motion, grade 1 diastolic dysfunction, mild LAE, PASP 32;   d. Lex MV 11/13:  EF 76%, no ischemia   OA (osteoarthritis) 09/07/2011   Obesity 09/28/2014   Osteoporosis    Personal history of chemotherapy 2012   Personal history of radiation therapy 2012   PREDIABETES  06/15/2009   Recurrent falls 12/28/2015   Shortness of breath    UTI (lower urinary tract infection) 03/03/2012   Vertebral fracture, osteoporotic, sequela 04/05/2016   Discharge Diagnoses:   Principal Problem:   Failed total left knee replacement ([place])  Estimated body mass index is 31.28 kg/m as calculated from the following:   Height as of this encounter: _0  (1.575 m).   Weight as of this encounter: 77.6 kg.  Procedure:  Procedure(s) (LRB): TOTAL KNEE REVISION (Left)   Consults: None  HPI: The patient is a 75 year old female with significant left knee pain.  Exam and history demonstrate a loose left total knee arthroplasty.  Infection workup has been negative.  She presents now for left total knee arthroplasty revision.  Laboratory Data: Admission on 07/14/2020, Discharged on 07/16/2020  Component Date Value Ref Range Status   WBC 07/15/2020 14.1 (A) 4.0 - 10.5 K/uL Final   RBC 07/15/2020 3.31 (A) 3.87 - 5.11 MIL/uL Final   Hemoglobin 07/15/2020 9.6 (A) 12.0 - 15.0 g/dL Final   HCT 07/15/2020 30.5 (A) 36.0 - 46.0 % Final   MCV 07/15/2020 92.1  80.0 - 100.0 fL Final   MCH 07/15/2020 29.0  26.0 - 34.0 pg Final   MCHC 07/15/2020 31.5  30.0 - 36.0 g/dL Final   RDW 07/15/2020  13.5  11.5 - 15.5 % Final   Platelets 07/15/2020 209  150 - 400 K/uL Final   nRBC 07/15/2020 0.0  0.0 - 0.2 % Final   Performed at [facility], [place] [address], [place], [place] [address]   Sodium 07/15/2020 140  135 - 145 mmol/L Final   Potassium 07/15/2020 4.0  3.5 - 5.1 mmol/L Final   Chloride 07/15/2020 106  98 - 111 mmol/L Final   CO2 07/15/2020 27  22 - 32 mmol/L Final   Glucose, Bld 07/15/2020 174 (A) 70 - 99 mg/dL Final   Glucose reference range applies only to samples taken after fasting for at least 8 hours.   BUN 07/15/2020 11  8 - 23 mg/dL Final   Creatinine, Ser 07/15/2020 0.93  0.44 - 1.00 mg/dL Final   Calcium 07/15/2020 8.8 (A) 8.9 - 10.3 mg/dL Final   GFR, Estimated  07/15/2020 >60  >60 mL/min Final   Comment: (NOTE) Calculated using the CKD-EPI Creatinine Equation (2021)    Anion gap 07/15/2020 7  5 - 15 Final   Performed at [facility], [place] [address] [place], [place] [address]   WBC 07/16/2020 13.8 (A) 4.0 - 10.5 K/uL Final   RBC 07/16/2020 2.83 (A) 3.87 - 5.11 MIL/uL Final   Hemoglobin 07/16/2020 8.3 (A) 12.0 - 15.0 g/dL Final   HCT 07/16/2020 26.8 (A) 36.0 - 46.0 % Final   MCV 07/16/2020 94.7  80.0 - 100.0 fL Final   MCH 07/16/2020 29.3  26.0 - 34.0 pg Final   MCHC 07/16/2020 31.0  30.0 - 36.0 g/dL Final   RDW 07/16/2020 14.2  11.5 - 15.5 % Final   Platelets 07/16/2020 185  150 - 400 K/uL Final   nRBC 07/16/2020 0.0  0.0 - 0.2 % Final   Performed at [facility], [place] [address] [place], [place] [address]   Sodium 07/16/2020 143  135 - 145 mmol/L Final   Potassium 07/16/2020 3.7  3.5 - 5.1 mmol/L Final   Chloride 07/16/2020 110  98 - 111 mmol/L Final   CO2 07/16/2020 28  22 - 32 mmol/L Final   Glucose, Bld 07/16/2020 169 (A) 70 - 99 mg/dL Final   Glucose reference range applies only to samples taken after fasting for at least 8 hours.   BUN 07/16/2020 11  8 - 23 mg/dL Final   Creatinine, Ser 07/16/2020 0.90  0.44 - 1.00 mg/dL Final   Calcium 07/16/2020 8.9  8.9 - 10.3 mg/dL Final   GFR, Estimated 07/16/2020 >60  >60 mL/min Final   Comment: (NOTE) Calculated using the CKD-EPI Creatinine Equation (2021)    Anion gap 07/16/2020 5  5 - 15 Final   Performed at [facility], [place] [address], [place], [place] [address]  Hospital Outpatient Visit on 07/12/2020  Component Date Value Ref Range Status   SARS Coronavirus 2 07/12/2020 NEGATIVE  NEGATIVE Final   Comment: (NOTE) SARS-CoV-2 target nucleic acids are NOT DETECTED.  The SARS-CoV-2 RNA is generally detectable in upper and lower respiratory specimens during the acute phase of infection. Negative results do not preclude SARS-CoV-2  infection, do not rule out co-infections with other pathogens, and should not be used as the sole basis for treatment or other patient management decisions. Negative results must be combined with clinical observations, patient history, and epidemiological information. The expected result is Negative.  Fact Sheet for Patients: SugarRoll.be  Fact Sheet for Healthcare Providers: https://www.woods-mathews.com/  This test is  not yet approved or cleared by the [place] and  has been authorized for detection and/or diagnosis of SARS-CoV-2 by FDA under an Emergency Use Authorization (EUA). This EUA will remain  in effect (meaning this test can be used) for the duration of the COVID-19 declaration under Se                          ction 564(b)(1) of the Act, 21 U.S.C. section 360bbb-3(b)(1), unless the authorization is terminated or revoked sooner.  Performed at [facility], [place] [address] [place], [place] [address]   Hospital Outpatient Visit on 07/12/2020  Component Date Value Ref Range Status   MRSA, PCR 07/12/2020 NEGATIVE  NEGATIVE Final   Staphylococcus aureus 07/12/2020 NEGATIVE  NEGATIVE Final   Comment: (NOTE) The Xpert SA Assay (FDA approved for NASAL specimens in patients 30 years of age and older), is one component of a comprehensive surveillance program. It is not intended to diagnose infection nor to guide or monitor treatment. Performed at [facility], [place] [address] [place], [place] [address]    ABO/RH(D) 07/12/2020 O POS   Final   Antibody Screen 07/12/2020 NEG   Final   Sample Expiration 07/12/2020 07/17/2020,2359   Final   Extend sample reason 07/12/2020    Final                   Value:NO TRANSFUSIONS OR PREGNANCY IN THE PAST 3 MONTHS Performed at [place] [address] [place], [place] [address]    Prothrombin Time 07/12/2020 13.7  11.4 - 15.2 seconds Final    INR 07/12/2020 1.0  0.8 - 1.2 Final   Comment: (NOTE) INR goal varies based on device and disease states. Performed at [facility], [place] [address], [place], [place] [address]   Lab on 07/07/2020  Component Date Value Ref Range Status   MICRO NUMBER: 07/07/2020 [id number]   Final   SPECIMEN QUALITY: 07/07/2020 Adequate   Final   Sample Source 07/07/2020 NOT GIVEN   Final   STATUS: 07/07/2020 FINAL   Final   ISOLATE 1: 07/07/2020    Final                   Value:Mixed genital flora isolated. These superficial bacteria are not indicative of a urinary tract infection. No further organism identification is warranted on this specimen. If clinically indicated, recollect clean-catch, mid-stream urine and transfer  immediately to Urine Culture Transport Tube.    Color, Urine 07/07/2020 YELLOW  Yellow;Lt. Yellow;Straw;Dark Yellow;Amber;Green;Red;Brown Final   APPearance 07/07/2020 Sl Cloudy (A) Clear;Turbid;Slightly Cloudy;Cloudy Final   Specific Gravity, Urine 07/07/2020 1.010  1.000 - 1.030 Final   pH 07/07/2020 6.0  5.0 - 8.0 Final   Total Protein, Urine 07/07/2020 NEGATIVE  Negative Final   Urine Glucose 07/07/2020 NEGATIVE  Negative Final   Ketones, ur 07/07/2020 NEGATIVE  Negative Final   Bilirubin Urine 07/07/2020 NEGATIVE  Negative Final   Hgb urine dipstick 07/07/2020 NEGATIVE  Negative Final   Urobilinogen, UA 07/07/2020 0.2  0.0 - 1.0 Final   Leukocytes,Ua 07/07/2020 MODERATE (A) Negative Final   Nitrite 07/07/2020 NEGATIVE  Negative Final   WBC, UA 07/07/2020 11-20/hpf (A) 0-2/hpf Final   RBC / HPF 07/07/2020 0-2/hpf  0-2/hpf Final   Squamous Epithelial / LPF 07/07/2020 Few(5-10/hpf) (A) Rare(0-4/hpf) Final   Bacteria, UA 07/07/2020 Rare(<10/hpf) (A) None Final  Office Visit on 07/05/2020  Component Date Value Ref Range Status   WBC 07/05/2020 6.9  4.0 - 10.5 K/uL Final   RBC 07/05/2020 4.17  3.87 - 5.11 Mil/uL Final   Platelets 07/05/2020 226.0  150.0 -  400.0 K/uL Final   Hemoglobin 07/05/2020 12.3  12.0 - 15.0 g/dL Final   HCT 07/05/2020 37.2  36.0 - 46.0 % Final   MCV 07/05/2020 89.2  78.0 - 100.0 fl Final   MCHC 07/05/2020 33.0  30.0 - 36.0 g/dL Final   RDW 07/05/2020 14.4  11.5 - 15.5 % Final   Sodium 07/05/2020 146 (A) 135 - 145 mEq/L Final   Potassium 07/05/2020 4.5  3.5 - 5.1 mEq/L Final   Chloride 07/05/2020 109  96 - 112 mEq/L Final   CO2 07/05/2020 27  19 - 32 mEq/L Final   Glucose, Bld 07/05/2020 108 (A) 70 - 99 mg/dL Final   BUN 07/05/2020 14  6 - 23 mg/dL Final   Creatinine, Ser 07/05/2020 1.06  0.40 - 1.20 mg/dL Final   Total Bilirubin 07/05/2020 0.3  0.2 - 1.2 mg/dL Final   Alkaline Phosphatase 07/05/2020 105  39 - 117 U/L Final   AST 07/05/2020 20  0 - 37 U/L Final   ALT 07/05/2020 17  0 - 35 U/L Final   Total Protein 07/05/2020 6.0  6.0 - 8.3 g/dL Final   Albumin 07/05/2020 4.0  3.5 - 5.2 g/dL Final   GFR 07/05/2020 51.18 (A) >60.00 mL/min Final   Calculated using the CKD-EPI Creatinine Equation (2021)   Calcium 07/05/2020 9.2  8.4 - 10.5 mg/dL Final   Cholesterol 07/05/2020 151  0 - 200 mg/dL Final   ATP III Classification       Desirable:  < 200 mg/dL               Borderline High:  200 - 239 mg/dL          High:  > = 240 mg/dL   Triglycerides 07/05/2020 202.0 (A) 0.0 - 149.0 mg/dL Final   Normal:  <150 mg/dLBorderline High:  150 - 199 mg/dL   HDL 07/05/2020 45.10  >39.00 mg/dL Final   VLDL 07/05/2020 40.4 (A) 0.0 - 40.0 mg/dL Final   Total CHOL/HDL Ratio 07/05/2020 3   Final                  Men          Women1/2 Average Risk     3.4          3.3Average Risk          5.0          4.42X Average Risk          9.6          7.13X Average Risk          15.0          11.0                       NonHDL 07/05/2020 105.71   Final   NOTE:  Non-HDL goal should be 30 mg/dL higher than patient's LDL goal (i.e. LDL goal of < 70 mg/dL, would have non-HDL goal of < 100 mg/dL)   TSH 07/05/2020 2.61  0.35 - 4.50 uIU/mL Final   Hgb  A1c MFr Bld 07/05/2020 5.9  4.6 - 6.5 % Final   Glycemic Control Guidelines for People with Diabetes:Non Diabetic:  <6%Goal of Therapy: <7%Additional Action Suggested:  >  8%    VITD 07/05/2020 26.79 (A) 30.00 - 100.00 ng/mL Final   Alcohol Metabolites 07/05/2020 NEGATIVE  ng/mL Final   Amphetamines 07/05/2020 NEGATIVE  <500 ng/mL Final   Benzodiazepines 07/05/2020 NEGATIVE  <100 ng/mL Final   Buprenorphine, Urine 07/05/2020 NEGATIVE  <5 ng/mL Final   Cocaine Metabolite 07/05/2020 NEGATIVE  <150 ng/mL Final   6 Acetylmorphine 07/05/2020 NEGATIVE  <10 ng/mL Final   Marijuana Metabolite 07/05/2020 NEGATIVE  <20 ng/mL Final   MDMA 07/05/2020 NEGATIVE  <500 ng/mL Final   Opiates 07/05/2020 NEGATIVE  <100 ng/mL Final   Oxycodone 07/05/2020 NEGATIVE  <100 ng/mL Final   Creatinine 07/05/2020 49.7  mg/dL Final   pH 07/05/2020 6.0  4.5 - 9.0 Final   Oxidant 07/05/2020 NEGATIVE  mcg/mL Final   Direct LDL 07/05/2020 75.0  mg/dL Final   Optimal:  <100 mg/dLNear or Above Optimal:  100-129 mg/dLBorderline High:  130-159 mg/dLHigh:  160-189 mg/dLVery High:  >190 mg/dL   Notes and Comments 07/05/2020    Final   Comment: This drug testing is for medical treatment only. Analysis was performed as non-forensic testing and these results should be used only by healthcare providers to render diagnosis or treatment, or to monitor progress of medical conditions. Marland Kitchen LDT Notes: Confirmation tests were developed and their analytical  performance characteristics have been determined by  [employer]. It has not been cleared or approved  by the FDA. This assay has been validated pursuant to  the CLIA regulations and is used for clinical purposes. . . Healthcare Providers needing Interpretation assistance,  please contact us at [phone] ([phone])  M-F, 8am to 10pm EST      X-Rays:No results found.  EKG: Orders placed or performed during the hospital encounter of 02/18/20   ED EKG   ED EKG      Hospital Course: MYKELA MEWBORN is a 75 y.o. who was admitted to [facility]. They were brought to the operating room on 07/14/2020 and underwent Procedure(s): [place].  Patient tolerated the procedure well and was later transferred to the recovery room and then to the orthopaedic floor for postoperative care. They were given PO and IV analgesics for pain control following their surgery. They were given 24 hours of postoperative antibiotics of  Anti-infectives (From admission, onward)    Start     Dose/Rate Route Frequency Ordered Stop   07/14/20 2000  ceFAZolin (ANCEF) IVPB 2g/100 mL premix        2 g 200 mL/hr over 30 Minutes Intravenous Every 6 hours 07/14/20 1826 07/15/20 0306   07/14/20 1145  ceFAZolin (ANCEF) IVPB 2g/100 mL premix        2 g 200 mL/hr over 30 Minutes Intravenous On call to O.R. 07/14/20 1144 07/14/20 1336      and started on DVT prophylaxis in the form of Xarelto.   PT and OT were ordered for total joint protocol. Discharge planning consulted to help with postop disposition and equipment needs. Patient had a good night on the evening of surgery. They started to get up OOB with therapy on POD #1. Continued to work with therapy into POD #2. Patient had issues with confusion and sundowning on the night/morning of day two. Hemovac drain came out of place. Patient was noncombative, but agitated. Dressing was changed and the incision was clean, dry, and intact. A new aquacel was applied and narcotic pain medication was discontinued. Family members came to visit and patient's cognition improved to baseline. She  was meeting her goals with physical therapy and was ready to go home. Family felt confident that patient would be better at home, which I agreed with. She was discharged to home later that day in stable condition.  Diet: Regular diet Activity: WBAT Follow-up: in 2 weeks Disposition: Home with HHPT Discharged Condition: stable   Discharge  Instructions     Call MD / Call 911   Complete by: As directed    If you experience chest pain or shortness of breath, CALL 911 and be transported to the hospital emergency room.  If you develope a fever above 101 F, pus (white drainage) or increased drainage or redness at the wound, or calf pain, call your surgeon's office.   Change dressing   Complete by: As directed    You may remove the bulky bandage (ACE wrap and gauze) two days after surgery. You will have an adhesive waterproof bandage underneath. Leave this in place until your first follow-up appointment.   Constipation Prevention   Complete by: As directed    Drink plenty of fluids.  Prune juice may be helpful.  You may use a stool softener, such as Colace (over the counter) 100 mg twice a day.  Use MiraLax (over the counter) for constipation as needed.   Diet - low sodium heart healthy   Complete by: As directed    Do not put a pillow under the knee. Place it under the heel.   Complete by: As directed    Driving restrictions   Complete by: As directed    No driving for two weeks   Post-operative opioid taper instructions:   Complete by: As directed    POST-OPERATIVE OPIOID TAPER INSTRUCTIONS: It is important to wean off of your opioid medication as soon as possible. If you do not need pain medication after your surgery it is ok to stop day one. Opioids include: Codeine, Hydrocodone(Norco, Vicodin), Oxycodone(Percocet, oxycontin) and hydromorphone amongst others.  Long term and even short term use of opiods can cause: Increased pain response Dependence Constipation Depression Respiratory depression And more.  Withdrawal symptoms can include Flu like symptoms Nausea, vomiting And more Techniques to manage these symptoms Hydrate well Eat regular healthy meals Stay active Use relaxation techniques(deep breathing, meditating, yoga) Do Not substitute Alcohol to help with tapering If you have been on opioids for less than  two weeks and do not have pain than it is ok to stop all together.  Plan to wean off of opioids This plan should start within one week post op of your joint replacement. Maintain the same interval or time between taking each dose and first decrease the dose.  Cut the total daily intake of opioids by one tablet each day Next start to increase the time between doses. The last dose that should be eliminated is the evening dose.      TED hose   Complete by: As directed    Use stockings (TED hose) for three weeks on both leg(s).  You may remove them at night for sleeping.   Weight bearing as tolerated   Complete by: As directed       Allergies as of 07/16/2020       Reactions   Dilaudid [hydromorphone Hcl] Itching        Medication List     STOP taking these medications    cholecalciferol 25 MCG (1000 UNIT) tablet Commonly known as: VITAMIN D3   multivitamin with minerals Tabs tablet   naproxen sodium  220 MG tablet Commonly known as: ALEVE   Pf[employer]-BioNT COVID-19 Vac-TriS Susp injection Generic drug: COVID-19 mRNA Vac-TriS Therapist, music)   Vitamin D (Ergocalciferol) 1.25 MG (50000 UNIT) Caps capsule Commonly known as: DRISDOL       TAKE these medications    acetaminophen 500 MG tablet Commonly known as: TYLENOL Take 1-2 tablets (500-1,000 mg total) by mouth every 8 (eight) hours as needed for moderate pain.   albuterol 108 (90 Base) MCG/ACT inhaler Commonly known as: VENTOLIN HFA Inhale 1-2 puffs into the lungs every 6 (six) hours as needed for wheezing or shortness of breath.   AMBULATORY NON FORMULARY MEDICATION Nitroglycerin 0.125% gel applied topically to the rectum TID x 6-8 weeks   cetirizine 10 MG tablet Commonly known as: ZYRTEC Take 10 mg by mouth daily.   clonazePAM 0.5 MG tablet Commonly known as: KLONOPIN Take 0.5-1 tablets (0.25-0.5 mg total) by mouth 2 (two) times daily as needed for anxiety.   diphenhydrAMINE 25 mg capsule Commonly known as:  BENADRYL Take 25 mg by mouth at bedtime.   donepezil 10 MG tablet Commonly known as: ARICEPT Take 1 tablet (10 mg total) by mouth at bedtime.   ferrous sulfate 325 (65 FE) MG tablet Take 325 mg by mouth daily with breakfast.   gabapentin 300 MG capsule Commonly known as: NEURONTIN Take 1 capsule (300 mg total) by mouth 2 (two) times daily as needed.   hydrOXYzine 25 MG tablet Commonly known as: ATARAX/VISTARIL Take 25 mg by mouth at bedtime.   methocarbamol 500 MG tablet Commonly known as: ROBAXIN Take 1 tablet (500 mg total) by mouth every 6 (six) hours as needed for muscle spasms.   pentosan polysulfate 100 MG capsule Commonly known as: ELMIRON Take 100 mg by mouth 2 (two) times daily.   rivaroxaban 10 MG Tabs tablet Commonly known as: XARELTO Take 1 tablet (10 mg total) by mouth daily with breakfast for 19 days. Then take one 81 mg aspirin once a day for three weeks. Then discontinue aspirin.   traMADol 50 MG tablet Commonly known as: ULTRAM Take 1-2 tablets (50-100 mg total) by mouth every 6 (six) hours as needed for severe pain.       ASK your doctor about these medications    carvedilol 25 MG tablet Commonly known as: COREG TAKE 1 TABLET TWICE DAILY WITH A MEAL   famotidine 20 MG tablet Commonly known as: PEPCID TAKE 1 TABLET BY MOUTH TWICE A DAY   folic acid 1 MG tablet Commonly known as: FOLVITE Take 1 tablet (1 mg total) by mouth daily.   rosuvastatin 20 MG tablet Commonly known as: CRESTOR TAKE 1 TABLET AT BEDTIME   telmisartan 80 MG tablet Commonly known as: MICARDIS TAKE 1 TABLET EVERY DAY   venlafaxine XR 75 MG 24 hr capsule Commonly known as: EFFEXOR-XR TAKE 2 CAPSULES EVERY DAY WITH BREAKFAST               Discharge Care Instructions  (From admission, onward)           Start     Ordered   07/16/20 0000  Weight bearing as tolerated        07/16/20 0741   07/16/20 0000  Change dressing       Comments: You may remove the  bulky bandage (ACE wrap and gauze) two days after surgery. You will have an adhesive waterproof bandage underneath. Leave this in place until your first follow-up appointment.   07/16/20 4696  Follow-up Information     Gaynelle Arabian, MD. Schedule an appointment as soon as possible for a visit on 07/27/2020.   Specialty: Orthopedic Surgery Contact information: [address] [place] [address] [phone]         [facility], [place] Follow up.   Specialty: [facility] Why: PT Contact information: [address] [place] [address] [phone]                 Signed: Theresa Duty, PA-C Orthopedic Surgery 07/19/2020, 1:40 PM

## 2020-07-21 DIAGNOSIS — G629 Polyneuropathy, unspecified: Secondary | ICD-10-CM | POA: Diagnosis not present

## 2020-07-21 DIAGNOSIS — T84093D Other mechanical complication of internal left knee prosthesis, subsequent encounter: Secondary | ICD-10-CM | POA: Diagnosis not present

## 2020-07-21 DIAGNOSIS — I10 Essential (primary) hypertension: Secondary | ICD-10-CM | POA: Diagnosis not present

## 2020-07-21 DIAGNOSIS — J45909 Unspecified asthma, uncomplicated: Secondary | ICD-10-CM | POA: Diagnosis not present

## 2020-07-21 DIAGNOSIS — M810 Age-related osteoporosis without current pathological fracture: Secondary | ICD-10-CM | POA: Diagnosis not present

## 2020-07-21 DIAGNOSIS — E669 Obesity, unspecified: Secondary | ICD-10-CM | POA: Diagnosis not present

## 2020-07-23 DIAGNOSIS — T84093D Other mechanical complication of internal left knee prosthesis, subsequent encounter: Secondary | ICD-10-CM | POA: Diagnosis not present

## 2020-07-23 DIAGNOSIS — E669 Obesity, unspecified: Secondary | ICD-10-CM | POA: Diagnosis not present

## 2020-07-23 DIAGNOSIS — I10 Essential (primary) hypertension: Secondary | ICD-10-CM | POA: Diagnosis not present

## 2020-07-23 DIAGNOSIS — G629 Polyneuropathy, unspecified: Secondary | ICD-10-CM | POA: Diagnosis not present

## 2020-07-23 DIAGNOSIS — J45909 Unspecified asthma, uncomplicated: Secondary | ICD-10-CM | POA: Diagnosis not present

## 2020-07-23 DIAGNOSIS — M810 Age-related osteoporosis without current pathological fracture: Secondary | ICD-10-CM | POA: Diagnosis not present

## 2020-07-26 DIAGNOSIS — J45909 Unspecified asthma, uncomplicated: Secondary | ICD-10-CM | POA: Diagnosis not present

## 2020-07-26 DIAGNOSIS — T84093D Other mechanical complication of internal left knee prosthesis, subsequent encounter: Secondary | ICD-10-CM | POA: Diagnosis not present

## 2020-07-26 DIAGNOSIS — I10 Essential (primary) hypertension: Secondary | ICD-10-CM | POA: Diagnosis not present

## 2020-07-26 DIAGNOSIS — M810 Age-related osteoporosis without current pathological fracture: Secondary | ICD-10-CM | POA: Diagnosis not present

## 2020-07-26 DIAGNOSIS — G629 Polyneuropathy, unspecified: Secondary | ICD-10-CM | POA: Diagnosis not present

## 2020-07-26 DIAGNOSIS — E669 Obesity, unspecified: Secondary | ICD-10-CM | POA: Diagnosis not present

## 2020-07-28 ENCOUNTER — Other Ambulatory Visit: Payer: Self-pay | Admitting: Neurology

## 2020-07-28 DIAGNOSIS — J45909 Unspecified asthma, uncomplicated: Secondary | ICD-10-CM | POA: Diagnosis not present

## 2020-07-28 DIAGNOSIS — M810 Age-related osteoporosis without current pathological fracture: Secondary | ICD-10-CM | POA: Diagnosis not present

## 2020-07-28 DIAGNOSIS — E669 Obesity, unspecified: Secondary | ICD-10-CM | POA: Diagnosis not present

## 2020-07-28 DIAGNOSIS — T84093D Other mechanical complication of internal left knee prosthesis, subsequent encounter: Secondary | ICD-10-CM | POA: Diagnosis not present

## 2020-07-28 DIAGNOSIS — I10 Essential (primary) hypertension: Secondary | ICD-10-CM | POA: Diagnosis not present

## 2020-07-28 DIAGNOSIS — G629 Polyneuropathy, unspecified: Secondary | ICD-10-CM | POA: Diagnosis not present

## 2020-07-29 DIAGNOSIS — Z471 Aftercare following joint replacement surgery: Secondary | ICD-10-CM | POA: Diagnosis not present

## 2020-07-29 DIAGNOSIS — M1712 Unilateral primary osteoarthritis, left knee: Secondary | ICD-10-CM | POA: Diagnosis not present

## 2020-08-04 DIAGNOSIS — M1712 Unilateral primary osteoarthritis, left knee: Secondary | ICD-10-CM | POA: Diagnosis not present

## 2020-08-04 DIAGNOSIS — Z471 Aftercare following joint replacement surgery: Secondary | ICD-10-CM | POA: Diagnosis not present

## 2020-08-09 DIAGNOSIS — M1712 Unilateral primary osteoarthritis, left knee: Secondary | ICD-10-CM | POA: Diagnosis not present

## 2020-08-09 DIAGNOSIS — Z471 Aftercare following joint replacement surgery: Secondary | ICD-10-CM | POA: Diagnosis not present

## 2020-08-11 DIAGNOSIS — Z471 Aftercare following joint replacement surgery: Secondary | ICD-10-CM | POA: Diagnosis not present

## 2020-08-11 DIAGNOSIS — M1712 Unilateral primary osteoarthritis, left knee: Secondary | ICD-10-CM | POA: Diagnosis not present

## 2020-08-13 DIAGNOSIS — M1712 Unilateral primary osteoarthritis, left knee: Secondary | ICD-10-CM | POA: Diagnosis not present

## 2020-08-13 DIAGNOSIS — Z471 Aftercare following joint replacement surgery: Secondary | ICD-10-CM | POA: Diagnosis not present

## 2020-08-16 DIAGNOSIS — Z471 Aftercare following joint replacement surgery: Secondary | ICD-10-CM | POA: Diagnosis not present

## 2020-08-16 DIAGNOSIS — M1712 Unilateral primary osteoarthritis, left knee: Secondary | ICD-10-CM | POA: Diagnosis not present

## 2020-08-17 DIAGNOSIS — Z4789 Encounter for other orthopedic aftercare: Secondary | ICD-10-CM | POA: Diagnosis not present

## 2020-08-18 DIAGNOSIS — Z471 Aftercare following joint replacement surgery: Secondary | ICD-10-CM | POA: Diagnosis not present

## 2020-08-18 DIAGNOSIS — M1712 Unilateral primary osteoarthritis, left knee: Secondary | ICD-10-CM | POA: Diagnosis not present

## 2020-08-19 ENCOUNTER — Other Ambulatory Visit: Payer: Self-pay | Admitting: Neurology

## 2020-08-20 DIAGNOSIS — M1712 Unilateral primary osteoarthritis, left knee: Secondary | ICD-10-CM | POA: Diagnosis not present

## 2020-08-20 DIAGNOSIS — Z471 Aftercare following joint replacement surgery: Secondary | ICD-10-CM | POA: Diagnosis not present

## 2020-08-23 ENCOUNTER — Other Ambulatory Visit (HOSPITAL_BASED_OUTPATIENT_CLINIC_OR_DEPARTMENT_OTHER): Payer: Medicare Other

## 2020-08-23 DIAGNOSIS — M1712 Unilateral primary osteoarthritis, left knee: Secondary | ICD-10-CM | POA: Diagnosis not present

## 2020-08-23 DIAGNOSIS — Z471 Aftercare following joint replacement surgery: Secondary | ICD-10-CM | POA: Diagnosis not present

## 2020-08-25 DIAGNOSIS — M1712 Unilateral primary osteoarthritis, left knee: Secondary | ICD-10-CM | POA: Diagnosis not present

## 2020-08-25 DIAGNOSIS — Z471 Aftercare following joint replacement surgery: Secondary | ICD-10-CM | POA: Diagnosis not present

## 2020-08-27 DIAGNOSIS — Z471 Aftercare following joint replacement surgery: Secondary | ICD-10-CM | POA: Diagnosis not present

## 2020-08-27 DIAGNOSIS — M1712 Unilateral primary osteoarthritis, left knee: Secondary | ICD-10-CM | POA: Diagnosis not present

## 2020-08-30 ENCOUNTER — Telehealth: Payer: Self-pay | Admitting: Adult Health

## 2020-08-30 NOTE — Telephone Encounter (Signed)
Scheduled appointment per provider. Left message. 

## 2020-08-31 DIAGNOSIS — Z471 Aftercare following joint replacement surgery: Secondary | ICD-10-CM | POA: Diagnosis not present

## 2020-08-31 DIAGNOSIS — M1712 Unilateral primary osteoarthritis, left knee: Secondary | ICD-10-CM | POA: Diagnosis not present

## 2020-09-02 DIAGNOSIS — M1712 Unilateral primary osteoarthritis, left knee: Secondary | ICD-10-CM | POA: Diagnosis not present

## 2020-09-02 DIAGNOSIS — Z471 Aftercare following joint replacement surgery: Secondary | ICD-10-CM | POA: Diagnosis not present

## 2020-09-06 DIAGNOSIS — Z471 Aftercare following joint replacement surgery: Secondary | ICD-10-CM | POA: Diagnosis not present

## 2020-09-06 DIAGNOSIS — M1712 Unilateral primary osteoarthritis, left knee: Secondary | ICD-10-CM | POA: Diagnosis not present

## 2020-09-21 ENCOUNTER — Encounter: Payer: Self-pay | Admitting: Internal Medicine

## 2020-09-21 ENCOUNTER — Ambulatory Visit (INDEPENDENT_AMBULATORY_CARE_PROVIDER_SITE_OTHER): Payer: Medicare Other | Admitting: Internal Medicine

## 2020-09-21 ENCOUNTER — Ambulatory Visit (HOSPITAL_BASED_OUTPATIENT_CLINIC_OR_DEPARTMENT_OTHER)
Admission: RE | Admit: 2020-09-21 | Discharge: 2020-09-21 | Disposition: A | Discharge status: Home / Self Care | Payer: Medicare Other | Source: Ambulatory Visit | Attending: Internal Medicine | Admitting: Internal Medicine

## 2020-09-21 ENCOUNTER — Other Ambulatory Visit: Payer: Self-pay

## 2020-09-21 ENCOUNTER — Other Ambulatory Visit: Payer: Self-pay | Admitting: Internal Medicine

## 2020-09-21 ENCOUNTER — Ambulatory Visit (INDEPENDENT_AMBULATORY_CARE_PROVIDER_SITE_OTHER): Payer: Medicare Other

## 2020-09-21 VITALS — BP 126/80 | HR 68 | Temp 98.1°F | Resp 18 | Ht 60.0 in | Wt 166.0 lb

## 2020-09-21 DIAGNOSIS — M7989 Other specified soft tissue disorders: Secondary | ICD-10-CM | POA: Insufficient documentation

## 2020-09-21 DIAGNOSIS — M79662 Pain in left lower leg: Secondary | ICD-10-CM | POA: Insufficient documentation

## 2020-09-21 DIAGNOSIS — I10 Essential (primary) hypertension: Secondary | ICD-10-CM

## 2020-09-21 DIAGNOSIS — R002 Palpitations: Secondary | ICD-10-CM

## 2020-09-21 LAB — CBC WITH DIFFERENTIAL/PLATELET
Basophils Absolute: 0 10*3/uL (ref 0.0–0.1)
Basophils Relative: 0.6 % (ref 0.0–3.0)
Eosinophils Absolute: 0.1 10*3/uL (ref 0.0–0.7)
Eosinophils Relative: 1.9 % (ref 0.0–5.0)
HCT: 36.2 % (ref 36.0–46.0)
Hemoglobin: 11.5 g/dL — ABNORMAL LOW (ref 12.0–15.0)
Lymphocytes Relative: 21.3 % (ref 12.0–46.0)
Lymphs Abs: 1.4 10*3/uL (ref 0.7–4.0)
MCHC: 31.8 g/dL (ref 30.0–36.0)
MCV: 85 fl (ref 78.0–100.0)
Monocytes Absolute: 0.6 10*3/uL (ref 0.1–1.0)
Monocytes Relative: 8.7 % (ref 3.0–12.0)
Neutro Abs: 4.6 10*3/uL (ref 1.4–7.7)
Neutrophils Relative %: 67.5 % (ref 43.0–77.0)
Platelets: 266 10*3/uL (ref 150.0–400.0)
RBC: 4.26 Mil/uL (ref 3.87–5.11)
RDW: 15.7 % — ABNORMAL HIGH (ref 11.5–15.5)
WBC: 6.7 10*3/uL (ref 4.0–10.5)

## 2020-09-21 LAB — BASIC METABOLIC PANEL
BUN: 18 mg/dL (ref 6–23)
CO2: 31 mEq/L (ref 19–32)
Calcium: 10 mg/dL (ref 8.4–10.5)
Chloride: 102 mEq/L (ref 96–112)
Creatinine, Ser: 0.9 mg/dL (ref 0.40–1.20)
GFR: 62.19 mL/min (ref >60.00)
Glucose, Bld: 95 mg/dL (ref 70–99)
Potassium: 5 mEq/L (ref 3.5–5.1)
Sodium: 140 mEq/L (ref 135–145)

## 2020-09-21 NOTE — Patient Instructions (Addendum)
We are referring you to have a Holter monitor for 3 days  Check the  blood pressure 2 or 3 times a   week BP GOAL is between 110/65 and  135/85. If it is consistently higher or lower, let me know  If he has severe symptoms, they are more frequent or intense: Seek medical attention.  GO TO THE LAB : Get the blood work     [place], [place] Come back for   a checkup in 3 months   STOP BY THE FIRST FLOOR: Schedule ultrasound of your leg

## 2020-09-21 NOTE — Progress Notes (Unsigned)
Patient enrolled for [employer] to mail a 3 day ZIO XT monitor to her address on file.

## 2020-09-21 NOTE — Progress Notes (Signed)
Subjective:    Patient ID: Shannon [name], female    DOB: May 04, 1945, 75 y.o.   MRN: [id number]  DOS:  09/21/2020 Type of visit - description: Acute  Symptoms a started 2 weeks ago: Having episodes where she feels shaky, "hot", her heart flutters. Episodes last typically 15 minutes, happening twice daily. They are random, not associated to exertion, food or any other clear cut trigger.  There is no associated chest pain, difficulty breathing or presyncope feeling No diaphoresis, no nausea. She is not taking any new medications.   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/2009   BRCA 2 NEGATIVE, CHEMO AND RADIATION X 1 YR   HYPERLIPIDEMIA 08/11/2008   HYPERTENSION 08/11/2008   Hyponatremia 08/28/2011   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    Melanoma ([place]) 2008   DR. Ronnald Ramp   Neuropathy    NICM (nonischemic cardiomyopathy) ([place]) 03/08/2010   likely 2/2 chemotx - a. Echo 2012: EF 45-50%;  b. Lex MV 2/12:  low risk, apical defect (small area of ischemia vs shifting breast  atten);  c.  Echo 7/12: Normal wall thickness, EF 60-65%, normal wall motion, grade 1 diastolic dysfunction, mild LAE, PASP 32;   d. Lex MV 11/13:  EF 76%, no ischemia   OA (osteoarthritis) 09/07/2011   Obesity 09/28/2014   Osteoporosis    Personal history of chemotherapy 2012   Personal history of radiation therapy 2012   PREDIABETES 06/15/2009   Recurrent falls 12/28/2015   Shortness of breath    UTI (lower urinary tract infection) 03/03/2012   Vertebral fracture, osteoporotic, sequela 04/05/2016    Past Surgical History:  Procedure Laterality Date   BREAST LUMPECTOMY  04/2009   RIGHT FOR BREAST CANCER-CHEMO/RADIATION X 1 YEAR   BREAST REDUCTION SURGERY Bilateral 05/04/2014   Procedure: MAMMARY REDUCTION  (BREAST);  Surgeon: Cristine Polio, MD;  Location: [place];  Service: Plastics;  Laterality: Bilateral;   CARPAL TUNNEL RELEASE     L wrist, ulnar nerve moved   CHOLECYSTECTOMY     CRANIOTOMY FOR TUMOR  2000   ELBOW SURGERY     left   KNEE ARTHROSCOPY Left 10/12/2014   Procedure: LEFT KNEE ARTHROSCOPY ;  Surgeon: Gaynelle Arabian, MD;  Location: [facility];  Service: Orthopedics;  Laterality: Left;   KYPHOPLASTY N/A 03/30/2016   Procedure: THORACIC 12 KYPHOPLASTY;  Surgeon:  Phylliss Bob, MD;  Location: [place];  Service: Orthopedics;  Laterality: N/A;  THORACIC 12 KYPHOPLASTY   LEFT HEART CATHETERIZATION WITH CORONARY ANGIOGRAM N/A 12/16/2013   Procedure: LEFT HEART CATHETERIZATION WITH CORONARY ANGIOGRAM;  Surgeon: Peter M [name], MD;  Location: [facility] CATH [facility];  Service: Cardiovascular;  Laterality: N/A;   LIPOMA EXCISION  03/28/2009   right leg   MELANOMA EXCISION     PORT-A-CATH REMOVAL  11/30/2010   [place]   porta cath     PORTACATH PLACEMENT  may 2011   REDUCTION MAMMAPLASTY Bilateral    SYNOVECTOMY Left 10/12/2014   Procedure: WITH SYNOVECTOMY;  Surgeon: Gaynelle Arabian, MD;  Location: [facility];  Service: Orthopedics;  Laterality: Left;   TONSILLECTOMY  1958   TOTAL KNEE  ARTHROPLASTY  02/05/2012   Procedure: TOTAL KNEE ARTHROPLASTY;  Surgeon: Gearlean Alf, MD;  Location: [facility];  Service: Orthopedics;  Laterality: Right;   TOTAL KNEE ARTHROPLASTY Left 02/10/2013   Procedure: LEFT TOTAL KNEE ARTHROPLASTY;  Surgeon: Gearlean Alf, MD;  Location: [facility];  Service: Orthopedics;  Laterality: Left;   total knee raplacement  01-2012   Right Knee   TOTAL KNEE REVISION Left 07/14/2020   Procedure: TOTAL KNEE REVISION;  Surgeon: Gaynelle Arabian, MD;  Location: [facility];  Service: Orthopedics;  Laterality: Left;   TUBAL LIGATION  1997    Allergies as of 09/21/2020       Reactions   Dilaudid [hydromorphone Hcl] Itching        Medication List        Accurate as of September 21, 2020  2:45 PM. If you have any questions, ask your nurse or doctor.          STOP taking these medications    rivaroxaban 10 MG Tabs tablet Commonly known as: XARELTO Stopped by: Kathlene November, MD       TAKE these medications    acetaminophen 500 MG tablet Commonly known as: TYLENOL Take 1-2 tablets (500-1,000 mg total) by mouth every 8 (eight) hours as needed for moderate pain.   albuterol 108 (90 Base) MCG/ACT inhaler Commonly known as: VENTOLIN HFA Inhale 1-2 puffs into the lungs every 6 (six) hours as needed for wheezing or shortness of breath.   AMBULATORY NON FORMULARY MEDICATION Nitroglycerin 0.125% gel applied topically to the rectum TID x 6-8 weeks   carvedilol 25 MG tablet Commonly known as: COREG TAKE 1 TABLET TWICE DAILY WITH A MEAL What changed: See the new instructions.   cetirizine 10 MG tablet Commonly known as: ZYRTEC Take 10 mg by mouth daily.   clonazePAM 0.5 MG tablet Commonly known as: KLONOPIN Take 0.5-1 tablets (0.25-0.5 mg total) by mouth 2 (two) times daily as needed for anxiety.   diphenhydrAMINE 25 mg capsule Commonly known as: BENADRYL Take 25 mg by mouth at bedtime.   donepezil 10 MG tablet Commonly known as: ARICEPT Take 1 tablet (10  mg total) by mouth at bedtime. What changed: Another medication with the same name was removed. Continue taking this medication, and follow the directions you see here. Changed by: Kathlene November, MD   ergocalciferol 1.25 MG (50000 UT) capsule Commonly known as: VITAMIN D2 Take 50,000 Units by mouth once a week.   famotidine 20 MG tablet Commonly known as: PEPCID TAKE 1 TABLET BY MOUTH TWICE A DAY   ferrous sulfate 325 (65 FE) MG tablet Take 325 mg by mouth daily with breakfast.   folic acid 1 MG tablet Commonly known as: [employer]  Take 1 tablet (1 mg total) by mouth daily. What changed: when to take this   gabapentin 300 MG capsule Commonly known as: NEURONTIN Take 1 capsule (300 mg total) by mouth 2 (two) times daily as needed.   hydrOXYzine 25 MG tablet Commonly known as: ATARAX/VISTARIL Take 25 mg by mouth at bedtime.   methocarbamol 500 MG tablet Commonly known as: ROBAXIN Take 1 tablet (500 mg total) by mouth every 6 (six) hours as needed for muscle spasms.   multivitamin with minerals Tabs tablet Take 1 tablet by mouth daily.   pentosan polysulfate 100 MG capsule Commonly known as: ELMIRON Take 100 mg by mouth 2 (two) times daily.   rosuvastatin 20 MG tablet Commonly known as: CRESTOR TAKE 1 TABLET AT BEDTIME   telmisartan 80 MG tablet Commonly known as: MICARDIS TAKE 1 TABLET EVERY DAY What changed:  how much to take when to take this   traMADol 50 MG tablet Commonly known as: ULTRAM Take 1-2 tablets (50-100 mg total) by mouth every 6 (six) hours as needed for severe pain.   venlafaxine XR 75 MG 24 hr capsule Commonly known as: EFFEXOR-XR TAKE 2 CAPSULES EVERY DAY WITH BREAKFAST What changed: See the new instructions.           Objective:   Physical Exam BP 126/80 (BP Location: Left Arm, Patient Position: Sitting, Cuff Size: Small)   Pulse 68   Temp 98.1 F (36.7 C) (Oral)   Resp 18   Ht 5' (1.524 m)   Wt 166 lb (75.3 kg)   LMP 01/30/1994    SpO2 98%   BMI 32.42 kg/m  General:   Well developed, NAD, BMI noted.  HEENT:  Normocephalic . Face symmetric, atraumatic Lungs:  CTA B Normal respiratory effort, no intercostal retractions, no accessory muscle use. Heart: RRR,  no murmur.  Abdomen:  Not distended, soft, non-tender. No rebound or rigidity.   Skin: Not pale. Not jaundice Lower extremities: no pretibial edema bilaterally, L calf is slightly larger in circumference by about three quarters of an inch.  Slightly TTP. Neurologic:  alert & oriented X3.  Speech normal, gait appropriate for age and unassisted Psych--  Cognition and judgment appear intact.  Cooperative with normal attention span and concentration.  Behavior appropriate. No anxious or depressed appearing.     Assessment     75 year old female, PMH includes breast cancer, melanoma, craniopharyngioma, obesity, high cholesterol, depression, anxiety, HTN, cognitive decline, on Aricept, presents with:  Palpitations: As described above, ; one time, sxs were  associated with a slightly elevated BP at 167/85. She had L total knee revision 07/14/2020, reports the swelling has decreased gradually.  On today exam the left calf is still larger and slightly tender. EKG today: NSR, no acute changes compared to previous EKG.   Recent TSH normal.  Some anemia noted. Plan: Ultrasound left leg rule out DVT (leg remains a slightly swollen after surgery few weeks ago) Holter or Zio patch 72-hour CBC, BMP HTN: On the same medications for a while, BP has been elevated a couple of times, recommend to continue monitoring.     This visit occurred during the SARS-CoV-2 public health emergency.  Safety protocols were in place, including screening questions prior to the visit, additional usage of staff PPE, and extensive cleaning of exam room while observing appropriate contact time as indicated for disinfecting solutions.

## 2020-09-23 ENCOUNTER — Ambulatory Visit (HOSPITAL_BASED_OUTPATIENT_CLINIC_OR_DEPARTMENT_OTHER)
Admission: RE | Admit: 2020-09-23 | Discharge: 2020-09-23 | Disposition: A | Discharge status: Home / Self Care | Payer: Medicare Other | Source: Ambulatory Visit | Attending: Family Medicine | Admitting: Family Medicine

## 2020-09-23 ENCOUNTER — Other Ambulatory Visit: Payer: Self-pay

## 2020-09-23 DIAGNOSIS — Z780 Asymptomatic menopausal state: Secondary | ICD-10-CM | POA: Diagnosis not present

## 2020-09-23 DIAGNOSIS — E2839 Other primary ovarian failure: Secondary | ICD-10-CM | POA: Diagnosis not present

## 2020-09-23 DIAGNOSIS — M810 Age-related osteoporosis without current pathological fracture: Secondary | ICD-10-CM | POA: Diagnosis not present

## 2020-09-23 DIAGNOSIS — M85832 Other specified disorders of bone density and structure, left forearm: Secondary | ICD-10-CM | POA: Diagnosis not present

## 2020-09-24 ENCOUNTER — Ambulatory Visit (INDEPENDENT_AMBULATORY_CARE_PROVIDER_SITE_OTHER): Payer: Medicare Other | Admitting: Medical

## 2020-09-24 ENCOUNTER — Encounter: Payer: Self-pay | Admitting: Medical

## 2020-09-24 ENCOUNTER — Encounter: Payer: Medicare Other | Admitting: Adult Health

## 2020-09-24 VITALS — BP 123/72 | HR 72 | Ht 60.0 in | Wt 169.2 lb

## 2020-09-24 DIAGNOSIS — E782 Mixed hyperlipidemia: Secondary | ICD-10-CM

## 2020-09-24 DIAGNOSIS — I10 Essential (primary) hypertension: Secondary | ICD-10-CM | POA: Diagnosis not present

## 2020-09-24 DIAGNOSIS — I5042 Chronic combined systolic (congestive) and diastolic (congestive) heart failure: Secondary | ICD-10-CM | POA: Diagnosis not present

## 2020-09-24 DIAGNOSIS — R002 Palpitations: Secondary | ICD-10-CM

## 2020-09-24 NOTE — Progress Notes (Signed)
Cardiology Office Note   Date:  09/24/2020   ID:  Shareka, Casale Sep 26, 1945, MRN [id number]  PCP:  Mosie Lukes, MD  Cardiologist:  Kirk Ruths, MD EP: None  Chief Complaint  Patient presents with   Palpitations       History of Present Illness: Shannon [name] is a 75 y.o. 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  She was last evaluated by cardiology at an outpatient visit with Dr. Stanford Breed 12/2019 at which time she was doing fairly well from a cardiac standpoint. A repeat echocardiogram was recommended at that time and showed EF 55-60%, G1DD, no RWMA, normal RV size/function, and no significant valvular abnormalities. She has a history of NICM with EF 45-50% in 2012. She had a NST in 2015 which showed a small area of moderate apical ischemia and underwent a LHC in 2015 which showed no significant CAD. Her cardiomyopathy was attributed to chemotherapy (herceptin) for management of her breast cancer. Since her last visit she underwent revision of left knee replacement 06/2020 which occurred without complications.   More recently she was seen by her PCP 09/21/20 and reported intermittent episodes of heart flutters lasting for 15 minutes at a time and occurring a couple times a day. A 3 day zio patch monitor was ordered which she is wearing today. She was also noted to have ongoing left leg swelling following her knee revision and a doppler was ordered to r/o DVT.   She presents today for routine follow-up. Recently she has been experiencing palpitations which have been worrisome. She is frequently woken from sleep with a racing heart beat sensation. She feels hear heart is beating fast and erratically. She some lightheadedness with these episodes and reports her hands and feet feel numb. No slurred speech, facial drooping, or weakness.  She has  no associated chest pain or SOB. She has checked her blood pressure during these episodes and BP is often low with SBP in the 90s. Symptoms have lasted up to an hour then resolve and occur 1-2 times per day. She otherwise have no complaints of CP, SOB, DOE, changes in chronic LLE swelling, orthopnea, PND, or syncope.     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   Anemia 08/28/2011   Asthma    as a child   Brain tumor ([place])    Breast cancer ([place])    right breast   Carpal tunnel syndrome of left wrist    Cataracts, bilateral 12/28/2015   Constipation 03/03/2012   DEPRESSION 08/11/2008   Dermatitis    Facial fracture ([place])    GERD 08/11/2008   H/O measles    H/O mumps    History of blood transfusion    History of chicken pox    History of hiatal hernia    Hx breast cancer, IDC, Right, 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/2009   BRCA 2 NEGATIVE, CHEMO AND RADIATION X 1 YR   HYPERLIPIDEMIA 08/11/2008   HYPERTENSION 08/11/2008   Hyponatremia 08/28/2011   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    Melanoma ([place]) 2008   DR. Ronnald Ramp   Neuropathy    NICM (nonischemic cardiomyopathy) ([place]) 03/08/2010   likely 2/2 chemotx - a. Echo 2012: EF 45-50%;  b. Lex MV 2/12:  low risk, apical defect (small area of ischemia vs shifting breast atten);  c.  Echo 7/12: Normal wall thickness, EF 60-65%, normal wall motion, grade 1 diastolic dysfunction, mild LAE, PASP 32;   d. Lex MV 11/13:  EF 76%, no ischemia   OA (osteoarthritis) 09/07/2011   Obesity 09/28/2014   Osteoporosis    Personal history  of chemotherapy 2012   Personal history of radiation therapy 2012   PREDIABETES 06/15/2009   Recurrent falls 12/28/2015   Shortness of breath    UTI (lower urinary tract infection) 03/03/2012   Vertebral fracture, osteoporotic, sequela 04/05/2016    Past Surgical History:  Procedure Laterality Date   BREAST LUMPECTOMY  04/2009   RIGHT FOR BREAST CANCER-CHEMO/RADIATION X 1 YEAR   BREAST REDUCTION SURGERY Bilateral 05/04/2014   Procedure: MAMMARY REDUCTION  (BREAST);  Surgeon: Cristine Polio, MD;  Location: [facility];  Service: Plastics;  Laterality: Bilateral;   CARPAL TUNNEL RELEASE     L wrist, ulnar nerve moved   CHOLECYSTECTOMY     CRANIOTOMY FOR TUMOR  2000   ELBOW SURGERY     left   KNEE ARTHROSCOPY Left 10/12/2014   Procedure: LEFT KNEE ARTHROSCOPY ;  Surgeon: Gaynelle Arabian, MD;  Location: [facility];  Service: Orthopedics;  Laterality: Left;   KYPHOPLASTY N/A 03/30/2016   Procedure: THORACIC 12 KYPHOPLASTY;  Surgeon: Phylliss Bob, MD;  Location: [facility];  Service: Orthopedics;  Laterality: N/A;  THORACIC 12 KYPHOPLASTY   LEFT HEART CATHETERIZATION WITH CORONARY ANGIOGRAM N/A 12/16/2013   Procedure: LEFT HEART CATHETERIZATION WITH CORONARY ANGIOGRAM;  Surgeon: Peter M [name], MD;  Location: [facility] CATH [facility];  Service: Cardiovascular;  Laterality: N/A;   LIPOMA EXCISION  03/28/2009   right leg   MELANOMA EXCISION     PORT-A-CATH REMOVAL  11/30/2010   [place]reck   porta cath     PORTACATH PLACEMENT  may 2011   REDUCTION MAMMAPLASTY Bilateral    SYNOVECTOMY Left 10/12/2014   Procedure: WITH SYNOVECTOMY;  Surgeon: Gaynelle Arabian, MD;  Location: [facility];  Service: Orthopedics;  Laterality: Left;   TONSILLECTOMY  1958   TOTAL KNEE ARTHROPLASTY  02/05/2012   Procedure: TOTAL KNEE ARTHROPLASTY;  Surgeon: Gearlean Alf, MD;  Location: [facility];  Service: Orthopedics;  Laterality: Right;   TOTAL KNEE ARTHROPLASTY Left 02/10/2013   Procedure: LEFT TOTAL KNEE ARTHROPLASTY;  Surgeon: Gearlean Alf, MD;  Location: [facility];  Service: Orthopedics;  Laterality: Left;   total knee raplacement  01-2012   Right Knee   TOTAL KNEE REVISION Left 07/14/2020   Procedure: TOTAL KNEE REVISION;  Surgeon: Gaynelle Arabian, MD;  Location: [facility];  Service: Orthopedics;  Laterality: Left;   TUBAL LIGATION  1997     Current Outpatient Medications  Medication Sig Dispense Refill   acetaminophen (TYLENOL) 500 MG tablet Take 1-2 tablets (500-1,000 mg total) by mouth every 8 (eight) hours as needed for moderate pain. 100 tablet 2   albuterol (VENTOLIN HFA) 108 (90 Base) MCG/ACT inhaler Inhale 1-2 puffs into the lungs every 6 (six) hours as needed for wheezing or shortness of breath. 18 g 3  carvedilol (COREG) 25 MG tablet TAKE 1 TABLET TWICE DAILY WITH A MEAL (Patient taking differently: Take 25 mg by mouth 2 (two) times daily with a meal.) 180 tablet 1   cetirizine (ZYRTEC) 10 MG tablet Take 10 mg by mouth daily.     clonazePAM (KLONOPIN) 0.5 MG tablet Take 0.5-1 tablets (0.25-0.5 mg total) by mouth 2 (two) times daily as needed for anxiety. 60 tablet 1   diphenhydrAMINE (BENADRYL) 25 mg capsule Take 25 mg by mouth at bedtime.     donepezil (ARICEPT) 10 MG tablet Take 1 tablet (10 mg total) by mouth at bedtime. 90 tablet 1   ergocalciferol (VITAMIN D2) 1.25 MG (50000 UT) capsule Take 50,000 Units by mouth once a week.     famotidine (PEPCID) 20 MG tablet TAKE 1 TABLET BY MOUTH TWICE A DAY (Patient taking differently: Take 20 mg by mouth 2 (two) times daily.) 180 tablet 1   ferrous sulfate 325 (65 FE) MG tablet Take 325 mg by mouth daily with breakfast.     folic acid (FOLVITE) 1 MG tablet Take 1 tablet (1 mg total) by mouth daily. (Patient taking differently: Take 1 mg by mouth at bedtime.) 90 tablet 0   gabapentin (NEURONTIN) 300 MG capsule Take 1 capsule (300 mg total) by mouth 2 (two) times daily as needed. 180 capsule 0   hydrOXYzine (ATARAX/VISTARIL) 25 MG tablet Take 25 mg by mouth at bedtime.      methocarbamol (ROBAXIN) 500 MG tablet Take 1 tablet (500 mg total) by mouth every 6 (six) hours as needed for muscle spasms. 40 tablet 0   Multiple Vitamin (MULTIVITAMIN WITH MINERALS) TABS tablet Take 1 tablet by mouth daily.     pentosan polysulfate (ELMIRON) 100 MG capsule Take 100 mg by mouth 2 (two) times daily.      rosuvastatin (CRESTOR) 20 MG tablet TAKE 1 TABLET AT BEDTIME (Patient taking differently: Take 20 mg by mouth at bedtime.) 90 tablet 3   telmisartan (MICARDIS) 80 MG tablet TAKE 1 TABLET EVERY DAY (Patient taking differently: Take 40 mg by mouth in the morning.) 90 tablet 3   traMADol (ULTRAM) 50 MG tablet Take 1-2 tablets (50-100 mg total) by mouth every 6 (six) hours as needed for severe pain. 40 tablet 0   venlafaxine XR (EFFEXOR-XR) 75 MG 24 hr capsule TAKE 2 CAPSULES EVERY DAY WITH BREAKFAST (Patient taking differently: Take 150 mg by mouth daily with breakfast.) 180 capsule 1   AMBULATORY NON FORMULARY MEDICATION Nitroglycerin 0.125% gel applied topically to the rectum TID x 6-8 weeks (Patient not taking: No sig reported) 1 each 1   No current facility-administered medications for this visit.    Allergies:   Dilaudid [hydromorphone hcl]    Social History:  The patient  reports that she has never smoked. She has never used smokeless tobacco. She reports that she does not drink alcohol and does not use drugs.   Family History:  The patient's family history includes Breast cancer in her mother; CVA in her son; Congestive Heart Failure in her father; Down syndrome in her son; Heart attack in her father; Heart disease in her maternal grandfather; Hypertension in her mother; Lung cancer in her mother; Prostate cancer in her brother and father; Stomach cancer in her maternal aunt; Uterine cancer in her maternal aunt.    ROS:  Please see the history of present illness.   Otherwise, review of systems are positive for none.   All other systems are reviewed and negative.  PHYSICAL EXAM: VS:  BP 123/72 (BP Location: Left Arm, Patient Position: Sitting, Cuff Size: Normal)   Pulse 72   Ht 5' (1.524 m)   Wt 169 lb 3.2 oz (76.7 kg)   LMP 01/30/1994   SpO2 99%   BMI 33.04 kg/m  , BMI Body mass index is 33.04 kg/m. GEN: Well nourished, well developed, in no acute distress HEENT: sclera anicteric Neck: no JVD, carotid bruits, or masses Cardiac: RRR; no murmurs, rubs, or gallops,no edema  Respiratory:  clear to auscultation bilaterally, normal work of breathing GI: soft, nontender, nondistended, + BS MS: no deformity or atrophy Skin: warm and dry, no rash Neuro:  Strength and sensation are intact Psych: euthymic mood, full affect   EKG:  EKG is not ordered today.    Recent Labs: 07/05/2020: ALT 17; TSH 2.61 09/21/2020: BUN 18; Creatinine, Ser 0.90; Hemoglobin 11.5; Platelets 266.0; Potassium 5.0; Sodium 140    Lipid Panel    Component Value Date/Time   CHOL 151 07/05/2020 1129   TRIG 202.0 (H) 07/05/2020 1129   HDL 45.10 07/05/2020 1129   CHOLHDL 3 07/05/2020 1129   VLDL 40.4 (H) 07/05/2020 1129   LDLCALC 80 06/05/2018 1215   LDLDIRECT 75.0 07/05/2020 1129      Wt Readings from Last 3 Encounters:  09/24/20 169 lb 3.2 oz (76.7 kg)  09/21/20 166 lb (75.3 kg)  07/14/20 171 lb (77.6 kg)      Other studies Reviewed: Additional studies/ records that were reviewed today include:   Echocardiogram 12/2019: 1. Left ventricular ejection fraction, by estimation, is 55 to 60%. The  left ventricle has normal function. The left ventricle has no regional  wall motion abnormalities. Left ventricular diastolic parameters are  consistent with Grade I diastolic  dysfunction (impaired relaxation).   2. Right ventricular systolic function is normal. The right ventricular  size is normal.   3. The mitral valve is normal in structure. No evidence of mitral valve  regurgitation. No evidence of mitral stenosis.   4. The aortic valve is normal in  structure. Aortic valve regurgitation is  not visualized. No aortic stenosis is present.   5. The inferior vena cava is normal in size with greater than 50%  respiratory variability, suggesting right atrial pressure of 3 mmHg.   NST 2015: Impression Exercise Capacity:  Lexiscan with no exercise. BP Response:  Normal blood pressure response. Clinical Symptoms:  No significant symptoms noted. ECG Impression:  No significant ECG changes with Lexiscan. Comparison with Prior Nuclear Study: Previous study reported as normal perfusion   LHC 2015: Coronary angiography: Coronary dominance: right   Left mainstem: Normal.   Left anterior descending (LAD): The LAD distally is small in caliber but otherwise appears normal.    [place] intermediate: large branch, normal.    Left circumflex (LCx): Normal.   Right coronary artery (RCA): Mild diffuse irregularities less than 20%.    Left ventriculography: Left ventricular systolic function is low normal, LVEF is estimated at 50-55%, there is no significant mitral regurgitation    Final Conclusions:   1. Minor nonobstructive CAD 2. Low normal LV function.   Overall Impression:  Intermediate risk stress nuclear study with a small area of moderate apical ischemia.   ASSESSMENT AND PLAN:  1. Palpitations: presents with intermittent episodes of palpitations, frequently waking her from sleep. Reports fast and erratic heart beats. Has not check HR at home during these episodes but reports BP is often low with SBP in the 90s. No associated  CP or SOB but has some lightheadedness and numbness in hands/feet. No stroke-like symptoms. Often last for 1 hour before resolving spontaneously. EKG at PCP visit 09/21/20 reviewed showing sinus rhythm. HR is regular on exam today. Is has a 3 day zio patch monitor ordered.  - Agree with 3 day monitor given symptoms occurring 1-2x per day. Will await results for cardiac monitor. Patient to have PCP send my way after  completion.  - Continue carvedilol 27m BID - We reviewed stroke like symptoms in the event these are Afib episodes, as well as vagal maneuvers in case these are SVT episodes. ED precautions reviewed.   2. Chronic combined CHF with recovery of EF: no recent volume overload complaints - Continue carvedilol and telmesartan - Continue to encourage low salt diet and daily weights  3. HTN: BP 123/72 today - Continue carvedilol and telmesartan  4. HLD: LDL 75, triglycerides 202 06/2020  - Continue crestor   Current medicines are reviewed at length with the patient today.  The patient does not have concerns regarding medicines.  The following changes have been made:  As above  Labs/ tests ordered today include:  No orders of the defined types were placed in this encounter.    Disposition:   FU with myself or Dr. CStanford Breedin 1 month  Signed, KAbigail Butts PA-C  09/24/2020 2:38 PM

## 2020-09-24 NOTE — Patient Instructions (Addendum)
Medication Instructions:  Your physician recommends that you continue on your current medications as directed. Please refer to the Current Medication list given to you today.  *If you need a refill on your cardiac medications before your next appointment, please call your pharmacy*   Lab Work: None If you have labs (blood work) drawn today and your tests are completely normal, you will receive your results only by: [place] (if you have MyChart) OR A paper copy in the mail If you have any lab test that is abnormal or we need to change your treatment, we will call you to review the results.   Testing/Procedures: None   Follow-Up: At [facility], you and your health needs are our priority.  As part of our continuing mission to provide you with exceptional heart care, we have created designated Provider Care Teams.  These Care Teams include your primary Cardiologist (physician) and Advanced Practice Providers (APPs -  Physician Assistants and Nurse Practitioners) who all work together to provide you with the care you need, when you need it.   Your next appointment:   1 month(s)  The format for your next appointment:   In Person  Provider:   You may see Kirk Ruths, MD or one of the following Advanced Practice Providers on your designated Care Team:   Roby Lofts, PA-C   Other Instructions 1.Try some  vasovagal maneuvers as we discussed 2.If you are having stroke like symptoms call EMS

## 2020-09-25 DIAGNOSIS — R002 Palpitations: Secondary | ICD-10-CM

## 2020-09-25 DIAGNOSIS — I10 Essential (primary) hypertension: Secondary | ICD-10-CM | POA: Diagnosis not present

## 2020-10-01 ENCOUNTER — Encounter: Payer: Self-pay | Admitting: Adult Health

## 2020-10-01 ENCOUNTER — Other Ambulatory Visit: Payer: Self-pay

## 2020-10-01 ENCOUNTER — Inpatient Hospital Stay: Payer: Medicare Other | Attending: Adult Health | Admitting: Adult Health

## 2020-10-01 VITALS — BP 163/77 | HR 69 | Temp 98.1°F | Resp 18 | Ht 60.0 in | Wt 164.5 lb

## 2020-10-01 DIAGNOSIS — C50311 Malignant neoplasm of lower-inner quadrant of right female breast: Secondary | ICD-10-CM

## 2020-10-01 DIAGNOSIS — Z1231 Encounter for screening mammogram for malignant neoplasm of breast: Secondary | ICD-10-CM | POA: Diagnosis not present

## 2020-10-01 DIAGNOSIS — Z171 Estrogen receptor negative status [ER-]: Secondary | ICD-10-CM | POA: Diagnosis not present

## 2020-10-01 DIAGNOSIS — Z853 Personal history of malignant neoplasm of breast: Secondary | ICD-10-CM | POA: Diagnosis not present

## 2020-10-01 NOTE — Progress Notes (Signed)
CLINIC:  Survivorship   REASON FOR VISIT:  Routine follow-up for history of breast cancer.   BRIEF ONCOLOGIC HISTORY:  Oncology History  Breast cancer of lower-inner quadrant of right female breast ([place])  04/2009 Initial Biopsy   Right breast core needle bx: IDC, ER/PR-, HER2/neu positive, Ki67 58%   04/2009 Clinical Stage   Stage IA: T1c No    - 08/2009 Neo-Adjuvant Chemotherapy   Taxol/ carbo/trastuzumab x 2, then Taxol/trastuzumab weekly x 3 (with last cycle with weekly carbo), A/C x 2 cycles with doxorubicin by continuous infusion (chemo poorly tolerated)   09/2009 Definitive Surgery   Right lumpectomy/SLNB: complete pathologic response    - 11/2009 Radiation Therapy   Adjuvant RT: right breast   09/2009 - 01/2010 Chemotherapy   Maintenance trastuzumab; discontinued due to drop in EF, which has since respolved      INTERVAL HISTORY:  Shannon [name] presents to the [facility] today for routine follow-up for her history of breast cancer.    Shannon is feeling moderately well today.  She has had some difficulty with exercise due to her having recently undergone a knee replacement.  She and her husband note that [place].    [name] is seeing her PCP regularly.  She had a skin issue under her breast in February that has since resolved.  She  was due for her bilateral breast mammogram in August and that has not been completed.  She denies any breast concerns today.     REVIEW OF SYSTEMS:  Review of Systems  Constitutional:  Negative for appetite change, chills, fatigue, fever and unexpected weight change.  HENT:   Negative for hearing loss, lump/mass and trouble swallowing.   Eyes:  Negative for eye problems and icterus.  Respiratory:  Negative for chest tightness, cough and shortness of breath.   Cardiovascular:  Negative for chest pain, leg swelling and palpitations.  Gastrointestinal:  Negative for abdominal distention, abdominal pain,  constipation, diarrhea, nausea and vomiting.  Endocrine: Negative for hot flashes.  Skin:  Negative for itching and rash.  Neurological:  Negative for dizziness, extremity weakness and headaches. Numbness: . Hematological:  Negative for adenopathy. Does not bruise/bleed easily.  Psychiatric/Behavioral:  Negative for depression. The patient is not nervous/anxious.  Breast: Denies any new nodularity, masses, tenderness, nipple changes, or nipple discharge.       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   Anemia 08/28/2011   Asthma    as a child   Brain tumor ([place])    Breast cancer ([place])    right breast   Carpal tunnel syndrome of left wrist    Cataracts, bilateral 12/28/2015   Constipation 03/03/2012   DEPRESSION 08/11/2008   Dermatitis    Facial fracture ([place])    GERD 08/11/2008   H/O measles    H/O mumps    History of blood transfusion    History of chicken pox    History of hiatal hernia    Hx breast cancer, IDC, Right, 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/2009   BRCA 2 NEGATIVE, CHEMO AND RADIATION X 1 YR   HYPERLIPIDEMIA 08/11/2008   HYPERTENSION 08/11/2008   Hyponatremia 08/28/2011   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    Melanoma ([place]) 2008   DR. Ronnald Ramp   Neuropathy    NICM (nonischemic cardiomyopathy) ([place]) 03/08/2010   likely 2/2 chemotx - a. Echo 2012: EF 45-50%;  b. Lex MV 2/12:  low risk, apical defect (small area of ischemia vs shifting breast atten);  c.  Echo 7/12: Normal wall thickness, EF 60-65%, normal wall  motion, grade 1 diastolic dysfunction, mild LAE, PASP 32;   d. Lex MV 11/13:  EF 76%, no ischemia   OA (osteoarthritis) 09/07/2011   Obesity 09/28/2014   Osteoporosis    Personal history of chemotherapy 2012   Personal history of radiation therapy 2012   PREDIABETES 06/15/2009   Recurrent falls 12/28/2015   Shortness of breath    UTI (lower urinary tract infection) 03/03/2012   Vertebral fracture, osteoporotic, sequela 04/05/2016   Past Surgical History:  Procedure Laterality Date   BREAST LUMPECTOMY  04/2009   RIGHT FOR BREAST CANCER-CHEMO/RADIATION X 1 YEAR   BREAST REDUCTION SURGERY Bilateral 05/04/2014   Procedure: MAMMARY REDUCTION  (BREAST);  Surgeon: Cristine Polio, MD;  Location: [facility];  Service: Plastics;  Laterality: Bilateral;   CARPAL TUNNEL RELEASE     L wrist, ulnar nerve moved   CHOLECYSTECTOMY     CRANIOTOMY FOR TUMOR  2000   ELBOW SURGERY     left   KNEE ARTHROSCOPY Left 10/12/2014   Procedure: LEFT KNEE ARTHROSCOPY ;  Surgeon: Gaynelle Arabian, MD;  Location: [facility];  Service: Orthopedics;  Laterality: Left;   KYPHOPLASTY N/A 03/30/2016   Procedure: THORACIC 12 KYPHOPLASTY;  Surgeon: Phylliss Bob, MD;  Location: [facility];  Service: Orthopedics;  Laterality: N/A;  THORACIC 12 KYPHOPLASTY   LEFT HEART CATHETERIZATION WITH CORONARY ANGIOGRAM N/A 12/16/2013   Procedure: LEFT HEART CATHETERIZATION WITH CORONARY ANGIOGRAM;  Surgeon: Peter M [name], MD;  [facility] C[facility];  Service: Cardiovascular;  Laterality: N/A;   LIPOMA EXCISION  03/28/2009   right leg   MELANOMA EXCISION     PORT-A-CATH REMOVAL  11/30/2010   Streck   porta cath     PORTACATH PLACEMENT  may 2011   REDUCTION MAMMAPLASTY Bilateral    SYNOVECTOMY Left 10/12/2014   Procedure: WITH SYNOVECTOMY;  Surgeon: Gaynelle Arabian, MD;  Location: [facility];  Service: Orthopedics;  Laterality: Left;   TONSILLECTOMY  1958   TOTAL KNEE ARTHROPLASTY  02/05/2012   Procedure: TOTAL KNEE ARTHROPLASTY;  Surgeon:  Gearlean Alf, MD;  Location: [facility];  Service: Orthopedics;  Laterality: Right;   TOTAL KNEE ARTHROPLASTY Left 02/10/2013   Procedure: LEFT TOTAL KNEE ARTHROPLASTY;  Surgeon: Gearlean Alf, MD;  Location: [facility];  Service: Orthopedics;  Laterality: Left;   total knee raplacement  01-2012   Right Knee   TOTAL KNEE REVISION Left 07/14/2020   Procedure: TOTAL KNEE REVISION;  Surgeon: Gaynelle Arabian, MD;  Location: [facility];  Service: Orthopedics;  Laterality: Left;   TUBAL LIGATION  1997     ALLERGIES:  Allergies  Allergen Reactions   Dilaudid [Hydromorphone Hcl] Itching     CURRENT MEDICATIONS:  Outpatient Encounter Medications as of 10/01/2020  Medication Sig Note   acetaminophen (TYLENOL) 500 MG tablet Take 1-2 tablets (500-1,000 mg total) by mouth every 8 (eight) hours as needed for moderate pain. 09/21/2020:  PRN   albuterol (VENTOLIN HFA) 108 (90 Base) MCG/ACT inhaler Inhale 1-2 puffs into the lungs every 6 (six) hours as needed for wheezing or shortness of breath. 09/21/2020: PRN   AMBULATORY NON FORMULARY MEDICATION Nitroglycerin 0.125% gel applied topically to the rectum TID x 6-8 weeks    carvedilol (COREG) 25 MG tablet TAKE 1 TABLET TWICE DAILY WITH A MEAL (Patient taking differently: Take 25 mg by mouth 2 (two) times daily with a meal.)    cetirizine (ZYRTEC) 10 MG tablet Take 10 mg by mouth daily.    clonazePAM (KLONOPIN) 0.5 MG tablet Take 0.5-1 tablets (0.25-0.5 mg total) by mouth 2 (two) times daily as needed for anxiety.    diphenhydrAMINE (BENADRYL) 25 mg capsule Take 25 mg by mouth at bedtime. As needed    donepezil (ARICEPT) 10 MG tablet Take 1 tablet (10 mg total) by mouth at bedtime.    ergocalciferol (VITAMIN D2) 1.25 MG (50000 UT) capsule Take 50,000 Units by mouth once a week.    famotidine (PEPCID) 20 MG tablet TAKE 1 TABLET BY MOUTH TWICE A DAY (Patient taking differently: Take 20 mg by mouth 2 (two) times daily.)    ferrous sulfate 325 (65 FE) MG tablet Take 325 mg  by mouth daily with breakfast.    folic acid (FOLVITE) 1 MG tablet Take 1 tablet (1 mg total) by mouth daily. (Patient taking differently: Take 1 mg by mouth at bedtime.)    gabapentin (NEURONTIN) 300 MG capsule Take 1 capsule (300 mg total) by mouth 2 (two) times daily as needed. (Patient taking differently: Take 300 mg by mouth 2 (two) times daily.)    hydrOXYzine (ATARAX/VISTARIL) 25 MG tablet Take 25 mg by mouth at bedtime.    methocarbamol (ROBAXIN) 500 MG tablet Take 1 tablet (500 mg total) by mouth every 6 (six) hours as needed for muscle spasms.    Multiple Vitamin (MULTIVITAMIN WITH MINERALS) TABS tablet Take 1 tablet by mouth daily.    pentosan polysulfate (ELMIRON) 100 MG capsule Take 100 mg by mouth 2 (two) times daily.     rosuvastatin (CRESTOR) 20 MG tablet TAKE 1 TABLET AT BEDTIME (Patient taking differently: Take 20 mg by mouth at bedtime.)    telmisartan (MICARDIS) 80 MG tablet TAKE 1 TABLET EVERY DAY (Patient taking differently: Take 40 mg by mouth in the morning.)    traMADol (ULTRAM) 50 MG tablet Take 1-2 tablets (50-100 mg total) by mouth every 6 (six) hours as needed for severe pain.    venlafaxine XR (EFFEXOR-XR) 75 MG 24 hr capsule TAKE 2 CAPSULES EVERY DAY WITH BREAKFAST (Patient taking differently: Take 150 mg by mouth daily with breakfast.) 09/21/2020: PRN   No facility-administered encounter medications on file as of 10/01/2020.     ONCOLOGIC FAMILY HISTORY:  Family History  Problem Relation Age of Onset   Breast cancer Mother        sarcoma   Lung cancer Mother    Hypertension Mother    Prostate cancer Father    Congestive Heart Failure Father    Heart attack Father    Prostate cancer Brother    Down syndrome Son    CVA Son    Heart disease Maternal Grandfather        MI   Stomach cancer Maternal Aunt    Uterine cancer Maternal Aunt    Colon cancer Neg Hx      SOCIAL HISTORY:  Social History   Socioeconomic History   Marital status: Married  Spouse name: Deidre Ala   Number of children: 3   Years of education: Not on file   Highest education level: Not on file  Occupational History   Occupation: boutique owner-retired    Employer: [employer]  Tobacco Use   Smoking status: Never   Smokeless tobacco: Never  Vaping Use   Vaping Use: Never used  Substance and Sexual Activity   Alcohol use: No   Drug use: No   Sexual activity: Not Currently  Other Topics Concern   Not on file  Social History Narrative   Patient is right-handed. She lives with her husband in a 4 story home. They take care of their grown son with down syndrome who has had a stroke. She drinks 1-2 glasses of tea in the evenings. She does not formally exercise.   Social Determinants of Health   Financial Resource Strain: Not on file  Food Insecurity: Not on file  Transportation Needs: Not on file  Physical Activity: Not on file  Stress: Not on file  Social Connections: Not on file  Intimate Partner Violence: Not on file      PHYSICAL EXAMINATION:  Vital Signs: Vitals:   10/01/20 1257  BP: (!) 163/77  Pulse: 69  Resp: 18  Temp: 98.1 F (36.7 C)  SpO2: 98%   Filed Weights   10/01/20 1257  Weight: 164 lb 8 oz (74.6 kg)   General: Well-nourished, well-appearing female in no acute distress.  Unaccompanied today.   HEENT: Head is normocephalic.  Pupils equal and reactive to light. Conjunctivae clear without exudate.  Sclerae anicteric. Oral mucosa is pink, moist.  Oropharynx is pink without lesions or erythema.  Lymph: No cervical, supraclavicular, or infraclavicular lymphadenopathy noted on palpation.  Cardiovascular: Regular rate and rhythm.Marland Kitchen Respiratory: Clear to auscultation bilaterally. Chest expansion symmetric; breathing non-labored.  Breast Exam:  -Left breast: No appreciable masses on palpation. No skin redness, thickening, or peau d'orange appearance; no nipple retraction or nipple discharge;  -Right breast: No appreciable masses on  palpation. No skin redness, thickening, or peau d'orange appearance; no nipple retraction or nipple discharge; mild distortion in symmetry at previous lumpectomy site well healed scar without erythema or nodularity. -Axilla: No axillary adenopathy bilaterally.  GI: Abdomen soft and round; non-tender, non-distended. Bowel sounds normoactive. No hepatosplenomegaly.   GU: Deferred.  Neuro: No focal deficits. Steady gait.  Psych: Mood and affect normal and appropriate for situation.  MSK: No focal spinal tenderness to palpation, full range of motion in bilateral upper extremities Extremities: No edema. Skin: Warm and dry.  LABORATORY DATA:  None for this visit   DIAGNOSTIC IMAGING:  Most recent mammogram: CLINICAL DATA:  Screening.   EXAM: DIGITAL SCREENING BILATERAL MAMMOGRAM WITH TOMO AND CAD   COMPARISON:  Previous exam(s).   ACR Breast Density Category a: The breast tissue is almost entirely fatty.   FINDINGS: There are no findings suspicious for malignancy. Images were processed with CAD.   IMPRESSION: No mammographic evidence of malignancy. A result letter of this screening mammogram will be mailed directly to the patient.   RECOMMENDATION: Screening mammogram in one year. (Code:SM-B-01Y)   BI-RADS CATEGORY  1: Negative.     Electronically Signed   By: Ammie Ferrier M.D.   On: 09/25/2019 13:09    ASSESSMENT AND PLAN:  Shannon [name] is a pleasant 75 y.o. female with history of Stage IA right breast invasive ductal carcinoma, ER-/PR-/HER2+, diagnosed in 04/2009, treated with neo-adjuvant chemotherapy, maintenance trastuzumab, lumpectomy, and adjuvant radiation therapy.  [facility] for surveillance and routine follow-up.   1. History of breast cancer:  Shannon [name] is currently clinically and radiographically without evidence of disease or recurrence of breast cancer. Shannon [name] is overdue for her mammogram.  She didn't realize that it was already  time to have it completed.  I placed orders for this today.    We will see her back in one year for LTS follow up I encouraged her to call me with any questions or concerns before her next visit at the cancer center, and I would be happy to see her sooner, if needed.    2. Bone health:  Given Shannon [name]'s age, history of breast cancer, she is at risk for bone demineralization.  She was given education on specific food and activities to promote bone health.  3. Cancer screening:  Due to Shannon [name]'s history and her age, she should receive screening for skin cancers, colon cancer, and gynecologic cancers. She was encouraged to follow-up with her PCP for appropriate cancer screenings.   4. Health maintenance and wellness promotion: Shannon [name] was encouraged to consume 5-7 servings of fruits and vegetables per day. She was also encouraged to engage in moderate to vigorous exercise for 30 minutes per day most days of the week. She was instructed to limit her alcohol consumption and continue to abstain from tobacco use.    Dispo:  -Return to cancer center in one year for LTS follow up -Mammogram overdue; orders placed   Total encounter time: 20 minutes*  Wilber Bihari, NP 10/01/20 1:10 PM Medical Oncology and Hematology [facility] [place], [place] [address] Tel. [phone]    Fax. [phone]  *Total Encounter Time as defined by the Centers for Medicare and Medicaid Services includes, in addition to the face-to-face time of a patient visit (documented in the note above) non-face-to-face time: obtaining and reviewing outside history, ordering and reviewing medications, tests or procedures, care coordination (communications with other health care professionals or caregivers) and documentation in the medical record.    Note: PRIMARY CARE PROVIDER Mosie Lukes, [place] [phone]

## 2020-10-05 ENCOUNTER — Telehealth: Payer: Self-pay | Admitting: Adult Health

## 2020-10-05 ENCOUNTER — Telehealth: Payer: Self-pay

## 2020-10-05 DIAGNOSIS — R002 Palpitations: Secondary | ICD-10-CM | POA: Diagnosis not present

## 2020-10-05 DIAGNOSIS — I10 Essential (primary) hypertension: Secondary | ICD-10-CM | POA: Diagnosis not present

## 2020-10-05 NOTE — Telephone Encounter (Signed)
Patient called requesting results of holter monitor. I did advise her that the results have been sent to ordering provider Larose Kells) however they have not been commented on.   She is not able to access's mychart right now, she would like call back with results.

## 2020-10-05 NOTE — Telephone Encounter (Signed)
Scheduled per 9/2 los. Called and spoke with pt confirmed 9/5 appt

## 2020-10-06 NOTE — Telephone Encounter (Signed)
Spoke w/ Pt- informed of results, she has an appt w/ cardiology in several days already.

## 2020-10-06 NOTE — Telephone Encounter (Signed)
Patient has documented episodes of fast heart rate.  Sending results to cardiology

## 2020-10-11 ENCOUNTER — Other Ambulatory Visit (HOSPITAL_BASED_OUTPATIENT_CLINIC_OR_DEPARTMENT_OTHER): Payer: Self-pay

## 2020-10-12 ENCOUNTER — Telehealth: Payer: Self-pay | Admitting: Family Medicine

## 2020-10-12 NOTE — Telephone Encounter (Signed)
Left message for patient to call back and schedule Medicare Annual Wellness Visit (AWV) in office.   If not able to come in office, please offer to do virtually or by telephone.  Left office number and my jabber [phone].  Last AWV:02/06/2019  Please schedule at anytime with Nurse Health Advisor.

## 2020-10-15 ENCOUNTER — Encounter: Payer: Self-pay | Admitting: Internal Medicine

## 2020-10-15 ENCOUNTER — Ambulatory Visit (INDEPENDENT_AMBULATORY_CARE_PROVIDER_SITE_OTHER): Payer: Medicare Other | Admitting: Internal Medicine

## 2020-10-15 ENCOUNTER — Telehealth: Payer: Self-pay | Admitting: Cardiology

## 2020-10-15 ENCOUNTER — Other Ambulatory Visit: Payer: Self-pay

## 2020-10-15 ENCOUNTER — Other Ambulatory Visit: Payer: Self-pay | Admitting: Cardiology

## 2020-10-15 VITALS — BP 118/70 | HR 61 | Resp 20 | Ht 60.0 in | Wt 167.2 lb

## 2020-10-15 DIAGNOSIS — Z79899 Other long term (current) drug therapy: Secondary | ICD-10-CM

## 2020-10-15 DIAGNOSIS — R002 Palpitations: Secondary | ICD-10-CM

## 2020-10-15 DIAGNOSIS — E782 Mixed hyperlipidemia: Secondary | ICD-10-CM | POA: Diagnosis not present

## 2020-10-15 DIAGNOSIS — I5042 Chronic combined systolic (congestive) and diastolic (congestive) heart failure: Secondary | ICD-10-CM

## 2020-10-15 DIAGNOSIS — I10 Essential (primary) hypertension: Secondary | ICD-10-CM

## 2020-10-15 MED ORDER — DILTIAZEM HCL 30 MG PO TABS: 30.0000 mg | ORAL_TABLET | Freq: Four times a day (QID) | ORAL | 3 refills | Status: DC | PRN

## 2020-10-15 NOTE — Telephone Encounter (Signed)
Patient c/o Palpitations:  High priority if patient c/o lightheadedness, shortness of breath, or chest pain  How long have you had palpitations/irregular HR/ Afib? Are you having the symptoms now? Yes one month  Are you currently experiencing lightheadedness, SOB or CP? CP with Palpitations/lightheadness right now  Do you have a history of afib (atrial fibrillation) or irregular heart rhythm?  No  Have you checked your BP or HR? (document readings if available): 167/93 HR 74  Are you experiencing any other symptoms? Numbness Hand/Feet righ now

## 2020-10-15 NOTE — Patient Instructions (Signed)
Medication Instructions:  PLEASE START DILTIAZEM '30mg'$  AS NEEDED FOR PALPITATIONS. DO NOT TAKE MORE THAN 4 DOSES IN 24 HOURS  *If you need a refill on your cardiac medications before your next appointment, please call your pharmacy*  Testing/Procedures: Your physician has recommended that you have a sleep study. This test records several body functions during sleep, including: brain activity, eye movement, oxygen and carbon dioxide blood levels, heart rate and rhythm, breathing rate and rhythm, the flow of air through your mouth and nose, snoring, body muscle movements, and chest and belly movement. SOMEONE WILL REACH OUT TO YOU REGARDING THIS   Follow-Up: At [facility], you and your health needs are our priority.  As part of our continuing mission to provide you with exceptional heart care, we have created designated Provider Care Teams.  These Care Teams include your primary Cardiologist (physician) and Advanced Practice Providers (APPs -  Physician Assistants and Nurse Practitioners) who all work together to provide you with the care you need, when you need it.  Your next appointment:   2-3 WEEKS  The format for your next appointment:   In Person  Provider:   You may see Kirk Ruths, MD or one of the following Advanced Practice Providers on your designated Care Team:   [place], PA-C Coletta Memos, FNP

## 2020-10-15 NOTE — Telephone Encounter (Signed)
Spoke with patient, made patient aware that appointment time has been made for her this afternoon at 2:40 with Dr. Margaretann Loveless (DOD). Patient states she will be able to make it to this appointment. Advised patient to call back with any issues in the mean time. Patient verbalized understanding.

## 2020-10-15 NOTE — Telephone Encounter (Signed)
Spoke with pt and notes these symptoms and states they are happening more often and lasting longer Appt was made with the DOD and was brought to attention there was no opening  Lm for pt to call back to let her know cannot be seen today and needs to go to ED for eval ./cy

## 2020-10-15 NOTE — Progress Notes (Signed)
Cardiology Office Note:    Date:  10/15/2020   ID:  Shannon [name], DOB 12/12/1945, MRN [id number]  PCP:  Mosie Lukes, MD  Cardiologist:  Kirk Ruths, MD  Electrophysiologist:  None   Referring MD: Mosie Lukes, MD   Chief Complaint/Reason for Referral: Acute care visit: Palpitations  History of Present Illness:    Shannon [name] is a 75 y.o. female with a history of CHF/NICM with recovery of EF, HTN, HLD, and breast cancer s/p lumpectomy and chemotherapy,  who presents for follow up of palpitations.  Recently seen by K. Kroeger PA-C, 3 day zio ordered for daily symptoms. We reviewed those results today. She called into the office line with one moth of palpitations, now with chest pain, and and feet numbness, and vitals of 167/93 and HR 74. Same day visit made for today. Monitor results demonstrate SVT and infrequent ectopy. We discussed use of PRN diltiazem in additional to daily carvedilol she is already on. EF previously low with recover of EF on last echocardiogram 01/12/20.   She snores, and would benefit from a sleep study for better control of palpitations and SVT.  The patient denies dyspnea at rest or with exertion, PND, orthopnea, or leg swelling. Denies cough, fever, chills. Denies nausea, vomiting. Denies syncope or presyncope. Denies dizziness or lightheadedness.      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   Anemia 08/28/2011   Asthma    as a child   Brain tumor ([place])    Breast cancer ([place])    right breast   Carpal tunnel syndrome of left wrist    Cataracts, bilateral 12/28/2015   Constipation 03/03/2012   DEPRESSION 08/11/2008   Dermatitis    Facial fracture ([place])    GERD 08/11/2008   H/O measles    H/O mumps    History of blood transfusion    History of chicken pox    History of hiatal hernia    Hx breast cancer, IDC, Right, 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/2009   BRCA 2 NEGATIVE, CHEMO AND RADIATION X 1 YR    HYPERLIPIDEMIA 08/11/2008   HYPERTENSION 08/11/2008   Hyponatremia 08/28/2011   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    Melanoma ([place]) 2008   DR. Ronnald Ramp   Neuropathy    NICM (nonischemic cardiomyopathy) ([place]) 03/08/2010   likely 2/2 chemotx - a. Echo 2012: EF 45-50%;  b. Lex MV 2/12:  low risk, apical defect (small area of ischemia vs shifting breast atten);  c.  Echo 7/12: Normal wall thickness, EF 60-65%, normal wall motion, grade 1 diastolic dysfunction, mild LAE, PASP 32;   d. Lex MV 11/13:  EF 76%, no ischemia   OA (osteoarthritis) 09/07/2011   Obesity 09/28/2014   Osteoporosis    Personal history of chemotherapy 2012   Personal history of radiation therapy 2012   PREDIABETES 06/15/2009   Recurrent falls 12/28/2015   Shortness of breath    UTI (lower urinary tract infection) 03/03/2012   Vertebral fracture, osteoporotic, sequela 04/05/2016  Past Surgical History:  Procedure Laterality Date   BREAST LUMPECTOMY  04/2009   RIGHT FOR BREAST CANCER-CHEMO/RADIATION X 1 YEAR   BREAST REDUCTION SURGERY Bilateral 05/04/2014   Procedure: MAMMARY REDUCTION  (BREAST);  Surgeon: Cristine Polio, MD;  Location: [facility];  Service: Plastics;  Laterality: Bilateral;   CARPAL TUNNEL RELEASE     L wrist, ulnar nerve moved   CHOLECYSTECTOMY     CRANIOTOMY FOR TUMOR  2000   ELBOW SURGERY     left   KNEE ARTHROSCOPY Left 10/12/2014   Procedure: LEFT KNEE ARTHROSCOPY ;  Surgeon: Gaynelle Arabian, MD;  Location: [facility];  Service: Orthopedics;  Laterality: Left;   KYPHOPLASTY N/A 03/30/2016   Procedure: THORACIC  12 KYPHOPLASTY;  Surgeon: Phylliss Bob, MD;  Location: [facility];  Service: Orthopedics;  Laterality: N/A;  THORACIC 12 KYPHOPLASTY   LEFT HEART CATHETERIZATION WITH CORONARY ANGIOGRAM N/A 12/16/2013   Procedure: LEFT HEART CATHETERIZATION WITH CORONARY ANGIOGRAM;  Surgeon: Peter M [name], MD;  Location: [facility] CATH [facility];  Service: Cardiovascular;  Laterality: N/A;   LIPOMA EXCISION  03/28/2009   right leg   MELANOMA EXCISION     PORT-A-CATH REMOVAL  11/30/2010   [place]   porta cath     PORTACATH PLACEMENT  may 2011   REDUCTION MAMMAPLASTY Bilateral    SYNOVECTOMY Left 10/12/2014   Procedure: WITH SYNOVECTOMY;  Surgeon: Gaynelle Arabian, MD;  Location: [facility];  Service: Orthopedics;  Laterality: Left;   TONSILLECTOMY  1958   TOTAL KNEE ARTHROPLASTY  02/05/2012   Procedure: TOTAL KNEE ARTHROPLASTY;  Surgeon: Gearlean Alf, MD;  Location: [facility];  Service: Orthopedics;  Laterality: Right;   TOTAL KNEE ARTHROPLASTY Left 02/10/2013   Procedure: LEFT TOTAL KNEE ARTHROPLASTY;  Surgeon: Gearlean Alf, MD;  Location: [facility];  Service: Orthopedics;  Laterality: Left;   total knee raplacement  01-2012   Right Knee   TOTAL KNEE REVISION Left 07/14/2020   Procedure: TOTAL KNEE REVISION;  Surgeon: Gaynelle Arabian, MD;  Location: [facility];  Service: Orthopedics;  Laterality: Left;   TUBAL LIGATION  1997    Current Medications: Current Meds  Medication Sig   acetaminophen (TYLENOL) 500 MG tablet Take 1-2 tablets (500-1,000 mg total) by mouth every 8 (eight) hours as needed for moderate pain.   albuterol (VENTOLIN HFA) 108 (90 Base) MCG/ACT inhaler Inhale 1-2 puffs into the lungs every 6 (six) hours as needed for wheezing or shortness of breath.   AMBULATORY NON FORMULARY MEDICATION Nitroglycerin 0.125% gel applied topically to the rectum TID x 6-8 weeks   carvedilol (COREG) 25 MG tablet TAKE 1 TABLET TWICE DAILY WITH A MEAL (Patient taking differently: Take 25 mg by mouth 2 (two) times daily with a meal.)    cetirizine (ZYRTEC) 10 MG tablet Take 10 mg by mouth daily.   clonazePAM (KLONOPIN) 0.5 MG tablet Take 0.5-1 tablets (0.25-0.5 mg total) by mouth 2 (two) times daily as needed for anxiety.   diltiazem (CARDIZEM) 30 MG tablet Take 1 tablet (30 mg total) by mouth 4 (four) times daily as needed (FOR PALPITATIONS- DO NOT TAKE MORE THAN 4 DOSES IN 24 HOURS). MAY TAKE 34m (1) TABLET AS NEEDED FOR PALPITATIONS, DO NOT TAKE MORE THAN 4 DOSES IN 24 HOURS   diphenhydrAMINE (BENADRYL) 25 mg capsule Take 25 mg by mouth at bedtime. As needed   donepezil (ARICEPT) 10 MG tablet Take 1 tablet (10 mg total) by mouth at bedtime.   ergocalciferol (VITAMIN D2) 1.25 MG (  50000 UT) capsule Take 50,000 Units by mouth once a week.   famotidine (PEPCID) 20 MG tablet TAKE 1 TABLET BY MOUTH TWICE A DAY (Patient taking differently: Take 20 mg by mouth 2 (two) times daily.)   ferrous sulfate 325 (65 FE) MG tablet Take 325 mg by mouth daily with breakfast.   folic acid (FOLVITE) 1 MG tablet Take 1 tablet (1 mg total) by mouth daily. (Patient taking differently: Take 1 mg by mouth at bedtime.)   gabapentin (NEURONTIN) 300 MG capsule Take 1 capsule (300 mg total) by mouth 2 (two) times daily as needed. (Patient taking differently: Take 300 mg by mouth 2 (two) times daily.)   hydrOXYzine (ATARAX/VISTARIL) 25 MG tablet Take 25 mg by mouth at bedtime.   methocarbamol (ROBAXIN) 500 MG tablet Take 1 tablet (500 mg total) by mouth every 6 (six) hours as needed for muscle spasms.   Multiple Vitamin (MULTIVITAMIN WITH MINERALS) TABS tablet Take 1 tablet by mouth daily.   pentosan polysulfate (ELMIRON) 100 MG capsule Take 100 mg by mouth 2 (two) times daily.    rosuvastatin (CRESTOR) 20 MG tablet TAKE 1 TABLET AT BEDTIME   telmisartan (MICARDIS) 80 MG tablet TAKE 1 TABLET EVERY DAY (Patient taking differently: Take 40 mg by mouth in the morning.)   venlafaxine XR (EFFEXOR-XR) 75 MG 24 hr capsule TAKE 2 CAPSULES EVERY DAY WITH BREAKFAST  (Patient taking differently: Take 150 mg by mouth daily with breakfast.)     Allergies:   Dilaudid [hydromorphone hcl]   Social History   Tobacco Use   Smoking status: Never   Smokeless tobacco: Never  Vaping Use   Vaping Use: Never used  Substance Use Topics   Alcohol use: No   Drug use: No     Family History: The patient's family history includes Breast cancer in her mother; CVA in her son; Congestive Heart Failure in her father; Down syndrome in her son; Heart attack in her father; Heart disease in her maternal grandfather; Hypertension in her mother; Lung cancer in her mother; Prostate cancer in her brother and father; Stomach cancer in her maternal aunt; Uterine cancer in her maternal aunt. There is no history of Colon cancer.  ROS:   Please see the history of present illness.    All other systems reviewed and are negative.  EKGs/Labs/Other Studies Reviewed:    The following studies were reviewed today:  EKG:  NSR  Imaging studies that I have independently reviewed today: n/a  Recent Labs: 07/05/2020: ALT 17; TSH 2.61 09/21/2020: BUN 18; Creatinine, Ser 0.90; Hemoglobin 11.5; Platelets 266.0; Potassium 5.0; Sodium 140  Recent Lipid Panel    Component Value Date/Time   CHOL 151 07/05/2020 1129   TRIG 202.0 (H) 07/05/2020 1129   HDL 45.10 07/05/2020 1129   CHOLHDL 3 07/05/2020 1129   VLDL 40.4 (H) 07/05/2020 1129   LDLCALC 80 06/05/2018 1215   LDLDIRECT 75.0 07/05/2020 1129    Physical Exam:    VS:  BP 118/70 (BP Location: Left Arm, Patient Position: Sitting, Cuff Size: Normal)   Pulse 61   Resp 20   Ht 5' (1.524 m)   Wt 167 lb 3.2 oz (75.8 kg)   LMP 01/30/1994   SpO2 98%   BMI 32.65 kg/m     Wt Readings from Last 5 Encounters:  10/15/20 167 lb 3.2 oz (75.8 kg)  10/01/20 164 lb 8 oz (74.6 kg)  09/24/20 169 lb 3.2 oz (76.7 kg)  09/21/20 166 lb (75.3 kg)  07/14/20 171 lb (77.6 kg)    Constitutional: No acute distress Eyes: sclera non-icteric, normal  conjunctiva and lids ENMT: normal dentition, moist mucous membranes Cardiovascular: regular rhythm, normal rate, no murmurs. S1 and S2 normal. Radial pulses normal bilaterally. No jugular venous distention.  Respiratory: clear to auscultation bilaterally GI : normal bowel sounds, soft and nontender. No distention.   MSK: extremities warm, well perfused. No edema.  NEURO: grossly nonfocal exam, moves all extremities. PSYCH: alert and oriented x 3, normal mood and affect.   ASSESSMENT:    1. Palpitations   2. Chronic combined systolic and diastolic CHF (congestive heart failure) ([place])   3. Essential hypertension   4. Hyperlipidemia, mixed   5. Medication management    PLAN:    Palpitations - Plan: EKG 12-Lead, Split night study - plan for sleep study to mitigate sx of palpitations. - diltiazem 30 mg po QID PRN palpitations.  Chronic combined systolic and diastolic CHF (congestive heart failure) (HCC) with recovery of EF - continue telmisartan 40 mg daily and carvedilol 25 mg BID.   Essential hypertension - BP well controlled, reasonable to add prn diltiazem for palpitations.   Hyperlipidemia, mixed - continue rosuvastatin 20 mg daily.  Total time of encounter: 30 minutes total time of encounter, including 20 minutes spent in face-to-face patient care on the date of this encounter. This time includes coordination of care and counseling regarding above mentioned problem list. Remainder of non-face-to-face time involved reviewing chart documents/testing relevant to the patient encounter and documentation in the medical record. I have independently reviewed documentation from referring provider.   Cherlynn Kaiser, MD, [place]   Shared Decision Making/Informed Consent:       Medication Adjustments/Labs and Tests Ordered: Current medicines are reviewed at length with the patient today.  Concerns regarding medicines are outlined above.   Orders Placed This  Encounter  Procedures   EKG 12-Lead   Split night study    Meds ordered this encounter  Medications   diltiazem (CARDIZEM) 30 MG tablet    Sig: Take 1 tablet (30 mg total) by mouth 4 (four) times daily as needed (FOR PALPITATIONS- DO NOT TAKE MORE THAN 4 DOSES IN 24 HOURS). MAY TAKE 69m (1) TABLET AS NEEDED FOR PALPITATIONS, DO NOT TAKE MORE THAN 4 DOSES IN 24 HOURS    Dispense:  120 tablet    Refill:  3    Patient Instructions  Medication Instructions:  PLEASE START DILTIAZEM 357mAS NEEDED FOR PALPITATIONS. DO NOT TAKE MORE THAN 4 DOSES IN 24 HOURS  *If you need a refill on your cardiac medications before your next appointment, please call your pharmacy*  Testing/Procedures: Your physician has recommended that you have a sleep study. This test records several body functions during sleep, including: brain activity, eye movement, oxygen and carbon dioxide blood levels, heart rate and rhythm, breathing rate and rhythm, the flow of air through your mouth and nose, snoring, body muscle movements, and chest and belly movement. SOMEONE WILL REACH OUT TO YOU REGARDING THIS   Follow-Up: [facility].  As part of our continuing mission to provide you with exceptional heart care, we have created designated Provider Care Teams.  These Care Teams include your primary Cardiologist (physician) and Advanced Practice Providers (APPs -  Physician Assistants and Nurse Practitioners) who all work together to provide you with the care you need, when you need it.  Your next appointment:  2-3 WEEKS  The format for your next appointment:   In Person  Provider:   You may see Kirk Ruths, MD or one of the following Advanced Practice Providers on your designated Care Team:   [place], PA-C Coletta Memos, FNP

## 2020-10-18 ENCOUNTER — Ambulatory Visit: Payer: Medicare Other | Admitting: Medical

## 2020-10-20 ENCOUNTER — Telehealth: Payer: Self-pay | Admitting: *Deleted

## 2020-10-20 ENCOUNTER — Other Ambulatory Visit: Payer: Self-pay | Admitting: Neurology

## 2020-10-20 NOTE — Telephone Encounter (Signed)
Patient notified of sleep study appointment scheduled for 12/15/20.

## 2020-10-23 NOTE — Progress Notes (Signed)
Cardiology Clinic Note   Patient Name: Shannon [name] Date of Encounter: 10/25/2020  Primary Care Provider:  Mosie Lukes, MD Primary Cardiologist:  Kirk Ruths, MD  Patient Profile     Shannon [name] 75 year old female presents the clinic today for follow-up evaluation of her chronic combined CHF and palpitations.  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   Anemia 08/28/2011   Asthma    as a child   Brain tumor ([place])    Breast cancer ([place])    right breast   Carpal tunnel syndrome of left wrist    Cataracts, bilateral 12/28/2015   Constipation 03/03/2012   DEPRESSION 08/11/2008   Dermatitis    Facial fracture ([place])    GERD 08/11/2008   H/O measles    H/O mumps    History of blood transfusion    History of chicken pox    History of hiatal hernia    Hx breast cancer, IDC, Right, 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/2009   BRCA 2 NEGATIVE, CHEMO AND RADIATION X 1 YR   HYPERLIPIDEMIA 08/11/2008   HYPERTENSION 08/11/2008   Hyponatremia 08/28/2011   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    Melanoma ([place]) 2008   DR. Ronnald Ramp   Neuropathy    NICM (nonischemic cardiomyopathy) ([place]) 03/08/2010   likely 2/2 chemotx - a. Echo 2012: EF 45-50%;  b. Lex MV 2/12:  low risk, apical defect (small area of ischemia vs shifting breast atten);  c.  Echo 7/12: Normal wall thickness, EF 60-65%, normal wall motion, grade 1 diastolic dysfunction, mild LAE, PASP 32;   d. Lex MV 11/13:  EF 76%, no ischemia   OA (osteoarthritis) 09/07/2011   Obesity 09/28/2014    Osteoporosis    Personal history of chemotherapy 2012   Personal history of radiation therapy 2012   PREDIABETES 06/15/2009   Recurrent falls 12/28/2015   Shortness of breath    UTI (lower urinary tract infection) 03/03/2012   Vertebral fracture, osteoporotic, sequela 04/05/2016   Past Surgical History:  Procedure Laterality Date   BREAST LUMPECTOMY  04/2009   RIGHT FOR BREAST CANCER-CHEMO/RADIATION X 1 YEAR   BREAST REDUCTION SURGERY Bilateral 05/04/2014   Procedure: MAMMARY REDUCTION  (BREAST);  Surgeon: Cristine Polio, MD;  Location: [place] [facility];  Service: Plastics;  Laterality: Bilateral;   CARPAL TUNNEL RELEASE     L wrist, ulnar nerve moved   CHOLECYSTECTOMY     CRANIOTOMY FOR TUMOR  2000   ELBOW SURGERY     left   KNEE ARTHROSCOPY Left 10/12/2014   Procedure: LEFT KNEE ARTHROSCOPY ;  Surgeon: Gaynelle Arabian, MD;  Location: [facility];  Service: Orthopedics;  Laterality: Left;   KYPHOPLASTY N/A 03/30/2016   Procedure: THORACIC 12 KYPHOPLASTY;  Surgeon: Phylliss Bob, MD;  Location: [facility];  Service: Orthopedics;  Laterality: N/A;  THORACIC 12 KYPHOPLASTY   LEFT HEART CATHETERIZATION WITH CORONARY ANGIOGRAM N/A 12/16/2013   Procedure: LEFT HEART CATHETERIZATION WITH CORONARY ANGIOGRAM;  Surgeon: Peter M [name],  MD;  Location: [facility];  Service: Cardiovascular;  Laterality: N/A;   LIPOMA EXCISION  03/28/2009   right leg   MELANOMA EXCISION     PORT-A-CATH REMOVAL  11/30/2010   [place]reck   porta cath     PORTACATH PLACEMENT  may 2011   REDUCTION MAMMAPLASTY Bilateral    SYNOVECTOMY Left 10/12/2014   Procedure: WITH SYNOVECTOMY;  Surgeon: Gaynelle Arabian, MD;  Location: [facility];  Service: Orthopedics;  Laterality: Left;   TONSILLECTOMY  1958   TOTAL KNEE ARTHROPLASTY  02/05/2012   Procedure: TOTAL KNEE ARTHROPLASTY;  Surgeon: Gearlean Alf, MD;  Location: [facility];  Service: Orthopedics;  Laterality: Right;   TOTAL KNEE ARTHROPLASTY Left 02/10/2013   Procedure: LEFT TOTAL KNEE  ARTHROPLASTY;  Surgeon: Gearlean Alf, MD;  Location: [facility];  Service: Orthopedics;  Laterality: Left;   total knee raplacement  01-2012   Right Knee   TOTAL KNEE REVISION Left 07/14/2020   Procedure: TOTAL KNEE REVISION;  Surgeon: Gaynelle Arabian, MD;  [facility];  Service: Orthopedics;  Laterality: Left;   TUBAL LIGATION  1997    Allergies  Allergies  Allergen Reactions   Dilaudid [Hydromorphone Hcl] Itching    History of Present Illness     Shannon [name] has a PMH of chronic combined CHF, nonischemic cardiomyopathy with recovered EF, hypertension, hyperlipidemia, breast cancer status postlumpectomy and chemotherapy.  Her PMH also includes palpitations.  She was seen by Dr. Stanford Breed on 11/21.  At that time she was doing well from a cardiac standpoint.  Her echocardiogram at that time showed an LVEF of 55-60%, G1 DD, normal LV, no significant valvular abnormalities.  Previously her EF was 45-50% with her and ICM in 2012.  She underwent stress testing in 2015 which showed small area of moderate apical ischemia.  She underwent left heart cath in 2015 which showed no significant coronary artery disease.  Her cardiomyopathy was attributed to her chemotherapy which she had received for her previous breast CA.  She underwent left total knee replacement 8/67 without complications.  She was seen by her PCP 09/21/2020 and reported intermittent episodes of heart flutters that lasted for about 15 minutes and occurred a couple times a day.  A cardiac event monitor was ordered.  She also noted ongoing left leg swelling status post knee revision.  A lower extremity Doppler was ordered to rule out DVT.  She was seen by Roby Lofts, PA-C on 09/24/2020.  She reported continued intermittent episodes of palpitations.  Her palpitations would wake her up from her sleep.  She reported heart racing.  She felt that the fast rate was also erratic.  She did report some lightheadedness and numbness in her feet  and hands with the episodes.  She denied slurred speech, facial droop, and weakness.  She denied shortness of breath and chest discomfort with the episodes.  She had been monitoring her blood pressure alongside the episodes and noticed that her blood pressure was low with a systolic reading in the 61P.  Her symptoms had lasted up to an hour and then would resolve.  Her cardiac event monitor 10/05/2020 showed predominantly normal sinus rhythm, SVT an episode of 29.5 seconds and a rate of 150 bpm.  Rare PVCs, PACs, and ventricular couplets were also noted.  She presents the clinic today for follow-up evaluation states she continues to have 2-3 episodes of increased heart rate at night.  She is sometimes woken from her sleep with fast heartbeats.  She does not feel that the 30 mg dosing is effective for managing her palpitations.  She has a split-night sleep study scheduled for November 1622.  We reviewed the pathophysiology around sleep apnea and this appears to be what is causing her palpitations.  She continues to be somewhat physically active through the day.  She denies palpitations during daytime hours.  We reviewed her cardiac event monitor.  She is also not using sleep aids or EtOH.  I will have her continue to monitor blood pressure, daily weights, increase her diltiazem to 60 mg twice daily as needed for palpitations and have her follow-up after her sleep study.  Today she denies chest pain, shortness of breath, lower extremity edema, fatigue, palpitations, melena, hematuria, hemoptysis, diaphoresis, weakness, presyncope, syncope, orthopnea, and PND.   Home Medications    Prior to Admission medications   Medication Sig Start Date End Date Taking? Authorizing Provider  acetaminophen (TYLENOL) 500 MG tablet Take 1-2 tablets (500-1,000 mg total) by mouth every 8 (eight) hours as needed for moderate pain. 07/16/20 07/16/21  Edmisten, Ok Anis, PA  albuterol (VENTOLIN HFA) 108 (90 Base) MCG/ACT inhaler  Inhale 1-2 puffs into the lungs every 6 (six) hours as needed for wheezing or shortness of breath. 08/26/19   Mosie Lukes, MD  AMBULATORY NON FORMULARY MEDICATION Nitroglycerin 0.125% gel applied topically to the rectum TID x 6-8 weeks 03/25/20   Thornton Park, MD  carvedilol (COREG) 25 MG tablet TAKE 1 TABLET TWICE DAILY WITH A MEAL Patient taking differently: Take 25 mg by mouth 2 (two) times daily with a meal. 06/29/20   Crenshaw, Denice Bors, MD  cetirizine (ZYRTEC) 10 MG tablet Take 10 mg by mouth daily.    [provider]  clonazePAM (KLONOPIN) 0.5 MG tablet Take 0.5-1 tablets (0.25-0.5 mg total) by mouth 2 (two) times daily as needed for anxiety. 03/15/20   Mosie Lukes, MD  diltiazem (CARDIZEM) 30 MG tablet Take 1 tablet (30 mg total) by mouth 4 (four) times daily as needed (FOR PALPITATIONS- DO NOT TAKE MORE THAN 4 DOSES IN 24 HOURS). MAY TAKE 41m (1) TABLET AS NEEDED FOR PALPITATIONS, DO NOT TAKE MORE THAN 4 DOSES IN 24 HOURS 10/15/20   AElouise Munroe MD  diphenhydrAMINE (BENADRYL) 25 mg capsule Take 25 mg by mouth at bedtime. As needed    [provider]  donepezil (ARICEPT) 10 MG tablet TAKE 1 TABLET (10 MG TOTAL) BY MOUTH AT BEDTIME. 10/21/20   JTomi Likens Adam R, DO  ergocalciferol (VITAMIN D2) 1.25 MG (50000 UT) capsule Take 50,000 Units by mouth once a week.    [provider]  famotidine (PEPCID) 20 MG tablet TAKE 1 TABLET BY MOUTH TWICE A DAY Patient taking differently: Take 20 mg by mouth 2 (two) times daily. 06/08/20   BMosie Lukes MD  ferrous sulfate 325 (65 FE) MG tablet Take 325 mg by mouth daily with breakfast.    [provider]  folic acid (FOLVITE) 1 MG tablet Take 1 tablet (1 mg total) by mouth daily. Patient taking differently: Take 1 mg by mouth at bedtime. 01/19/20   BMosie Lukes MD  gabapentin (NEURONTIN) 300 MG capsule Take 1 capsule (300 mg total) by mouth 2 (two) times daily as needed. Patient taking differently: Take 300  mg by mouth 2 (two) times daily. 07/28/19   BMosie Lukes MD  hydrOXYzine (ATARAX/VISTARIL) 25 MG tablet Take 25 mg by mouth at bedtime.    [provider]  methocarbamol (ROBAXIN) 500 MG tablet Take 1 tablet (500 mg total) by mouth every 6 (six) hours as needed for muscle spasms. 07/16/20   Edmisten, Ok Anis, PA  Multiple Vitamin (MULTIVITAMIN WITH MINERALS) TABS tablet Take 1 tablet by mouth daily.    [provider]  pentosan polysulfate (ELMIRON) 100 MG capsule Take 100 mg by mouth 2 (two) times daily.  02/07/12   Perkins, Alexzandrew L, PA-C  rosuvastatin (CRESTOR) 20 MG tablet TAKE 1 TABLET AT BEDTIME 10/15/20   Lelon Perla, MD  telmisartan (MICARDIS) 80 MG tablet TAKE 1 TABLET EVERY DAY Patient taking differently: Take 40 mg by mouth in the morning. 06/22/20   Lelon Perla, MD  venlafaxine XR (EFFEXOR-XR) 75 MG 24 hr capsule TAKE 2 CAPSULES EVERY DAY WITH BREAKFAST Patient taking differently: Take 150 mg by mouth daily with breakfast. 07/07/20   Mosie Lukes, MD    Family History    Family History  Problem Relation Age of Onset   Breast cancer Mother        sarcoma   Lung cancer Mother    Hypertension Mother    Prostate cancer Father    Congestive Heart Failure Father    Heart attack Father    Prostate cancer Brother    Down syndrome Son    CVA Son    Heart disease Maternal Grandfather        MI   Stomach cancer Maternal Aunt    Uterine cancer Maternal Aunt    Colon cancer Neg Hx    She indicated that her mother is deceased. She indicated that her father is deceased. She indicated that her sister is alive. She indicated that her brother is alive. She indicated that her maternal grandmother is deceased. She indicated that her maternal grandfather is deceased. She indicated that her paternal grandmother is deceased. She indicated that her paternal grandfather is deceased. She indicated that her daughter is alive. She indicated that both of her sons  are alive. She indicated that the status of her neg hx is unknown.  Social History    Social History   Socioeconomic History   Marital status: Married    Spouse name: Deidre Ala   Number of children: 3   Years of education: Not on file   Highest education level: Not on file  Occupational History   Occupation: boutique owner-retired    Employer: [employer]  Tobacco Use   Smoking status: Never   Smokeless tobacco: Never  Vaping Use   Vaping Use: Never used  Substance and Sexual Activity   Alcohol use: No   Drug use: No   Sexual activity: Not Currently  Other Topics Concern   Not on file  Social History Narrative   Patient is right-handed. She lives with her husband in a 4 story home. They take care of their grown son with down syndrome who has had a stroke. She drinks 1-2 glasses of tea in the evenings. She does not formally exercise.   Social Determinants of Health   Financial Resource Strain: Not on file  Food Insecurity: Not on file  Transportation Needs: Not on file  Physical Activity: Not on file  Stress: Not on file  Social Connections: Not on file  Intimate Partner Violence: Not on file     Review of Systems    General:  No chills, fever, night sweats or weight changes.  Cardiovascular:  No chest pain, dyspnea on exertion, edema, orthopnea, palpitations, paroxysmal nocturnal dyspnea. Dermatological:  No rash, lesions/masses Respiratory: No cough, dyspnea Urologic: No hematuria, dysuria Abdominal:   No nausea, vomiting, diarrhea, bright red blood per rectum, melena, or hematemesis Neurologic:  No visual changes, wkns, changes in mental status. All other systems reviewed and are otherwise negative except as noted above.  Physical Exam    VS:  BP 132/64   Pulse 61   Ht 5' (1.524 m)   Wt 168 lb (76.2 kg)   LMP 01/30/1994   SpO2 98%   BMI 32.81 kg/m  , BMI Body mass index is 32.81 kg/m. GEN: Well nourished, well developed, in no acute distress. HEENT:  normal. Neck: Supple, no JVD, carotid bruits, or masses. Cardiac: RRR, no murmurs, rubs, or gallops. No clubbing, cyanosis, edema.  Radials/DP/PT 2+ and equal bilaterally.  Respiratory:  Respirations regular and unlabored, clear to auscultation bilaterally. GI: Soft, nontender, nondistended, BS + x 4. MS: no deformity or atrophy. Skin: warm and dry, no rash. Neuro:  Strength and sensation are intact. Psych: Normal affect.  Accessory Clinical Findings    Recent Labs: 07/05/2020: ALT 17; TSH 2.61 09/21/2020: BUN 18; Creatinine, Ser 0.90; Hemoglobin 11.5; Platelets 266.0; Potassium 5.0; Sodium 140   Recent Lipid Panel    Component Value Date/Time   CHOL 151 07/05/2020 1129   TRIG 202.0 (H) 07/05/2020 1129   HDL 45.10 07/05/2020 1129   CHOLHDL 3 07/05/2020 1129   VLDL 40.4 (H) 07/05/2020 1129   LDLCALC 80 06/05/2018 1215   LDLDIRECT 75.0 07/05/2020 1129    ECG personally reviewed by me today-none today.  Echocardiogram 12/21 1. Left ventricular ejection fraction, by estimation, is 55 to 60%. The  left ventricle has normal function. The left ventricle has no regional  wall motion abnormalities. Left ventricular diastolic parameters are  consistent with Grade I diastolic  dysfunction (impaired relaxation).   2. Right ventricular systolic function is normal. The right ventricular  size is normal.   3. The mitral valve is normal in structure. No evidence of mitral valve  regurgitation. No evidence of mitral stenosis.   4. The aortic valve is normal in structure. Aortic valve regurgitation is  not visualized. No aortic stenosis is present.   5. The inferior vena cava is normal in size with greater than 50%  respiratory variability, suggesting right atrial pressure of 3 mmHg.  Cardiac event monitor 10/05/2020 Predominant rhythm was normal sinus rhythm with average heart rate 72bpm and ranged from 47 to 152bpm. SVT up to 29.5 seconds at 150bpm. Rare PVC, PAC and ventricular couplet.      Patch Wear Time:  3 days and 0 hours (2022-08-27T17:47:48-0400 to 2022-08-30T17:49:19-0400)   Patient had a min HR of 47 bpm, max HR of 152 bpm, and avg HR of 72 bpm. Predominant underlying rhythm was Sinus Rhythm. 28 Supraventricular Tachycardia runs occurred, the run with the fastest interval lasting 29.5 secs with a max rate of 150 bpm (avg  132 bpm); the run with the fastest interval was also the longest. True duration of Supraventricular Tachycardia difficult to ascertain due to artifact. Isolated SVEs were rare (<1.0%), SVE Couplets were rare (<1.0%), and no SVE Triplets were present.  Isolated VEs were rare (<1.0%), VE Couplets were rare (<1.0%), and no VE Triplets were present.  Assessment & Plan   1.  Palpitations-continues to have periodic episodes of irregular or fast heartbeat during the night that sometimes will wake her up.  She has been scheduled for a split-night sleep study 12/15/2020.  It appears that her episodes  may be related to sleep apnea.  She feels that the 30 mg dosing is not effective for her increased heart rate. Continue carvedilol,  Increase diltiazem to 60 mg twice daily as needed Avoid triggers caffeine, chocolate, EtOH, dehydration etc. Heart healthy low-sodium diet Increase physical activity as tolerated  Chronic combined systolic and diastolic CHF-euvolemic today.  Weight today168 lbs.  No increased DOE or activity intolerance. Continue carvedilol, telmisartan Heart healthy low-sodium diet-salty 6 given Increase physical activity as tolerated Daily weights-contact office with a weight increase of 3 pounds overnight or 5 pounds in 1 week  Essential hypertension-BP today 132/64.  Well-controlled at home. Continue carvedilol, telmisartan Heart healthy low-sodium diet-salty 6 given Increase physical activity as tolerated  Hyperlipidemia-07/05/2020: Cholesterol 151; HDL 45.10; Triglycerides 202.0; VLDL 40.4 Continue rosuvastatin Heart healthy low-sodium  high-fiber diet Increase physical activity as tolerated  Disposition: Follow-up with Dr. Stanford Breed and 3 months.  Jossie Ng. Mekiah Cambridge NP-C    10/25/2020, 4:00 PM [facility] [address] Office [phone] Fax [phone]  Notice: This dictation was prepared with Dragon dictation along with smaller phrase technology. Any transcriptional errors that result from this process are unintentional and may not be corrected upon review.  I spent 13 minutes examining this patient, reviewing medications, and using patient centered shared decision making involving her cardiac care.  Prior to her visit I spent greater than 20 minutes reviewing her past medical history,  medications, and prior cardiac tests.

## 2020-10-25 ENCOUNTER — Encounter: Payer: Self-pay | Admitting: General Practice

## 2020-10-25 ENCOUNTER — Other Ambulatory Visit: Payer: Self-pay

## 2020-10-25 ENCOUNTER — Ambulatory Visit (INDEPENDENT_AMBULATORY_CARE_PROVIDER_SITE_OTHER): Payer: Medicare Other | Admitting: General Practice

## 2020-10-25 VITALS — BP 132/64 | HR 61 | Ht 60.0 in | Wt 168.0 lb

## 2020-10-25 DIAGNOSIS — I10 Essential (primary) hypertension: Secondary | ICD-10-CM | POA: Diagnosis not present

## 2020-10-25 DIAGNOSIS — E782 Mixed hyperlipidemia: Secondary | ICD-10-CM | POA: Diagnosis not present

## 2020-10-25 DIAGNOSIS — I5042 Chronic combined systolic (congestive) and diastolic (congestive) heart failure: Secondary | ICD-10-CM | POA: Diagnosis not present

## 2020-10-25 DIAGNOSIS — R002 Palpitations: Secondary | ICD-10-CM

## 2020-10-25 NOTE — Patient Instructions (Signed)
Medication Instructions:  DIOLTIAZEM UP TO 2 TIMES DAILY AS NEEDED FOR PALPITATIONS  *If you need a refill on your cardiac medications before your next appointment, please call your pharmacy*  Lab Work:   Testing/Procedures:  NONE    NONE  Special Instructions PLEASE READ AND FOLLOW SLEEP TIPS-ATTACHED  TAKE AND LOG YOUR WEIGHT AND BLOOD PRESSURE  Follow-Up: Your next appointment:  AFTER SLEEP STUDY  In Person with Kirk Ruths, MD   At [facility] you and your health needs are our priority.  As part of our continuing mission to provide you with exceptional heart care, we have created designated Provider Care Teams.  These Care Teams include your primary Cardiologist (physician) and Advanced Practice Providers (APPs -  Physician Assistants and Nurse Practitioners) who all work together to provide you with the care you need, when you need it.  We recommend signing up for the patient portal called "MyChart".  Sign up information is provided on this After Visit Summary.  MyChart is used to connect with patients for Virtual Visits (Telemedicine).  Patients are able to view lab/test results, encounter notes, upcoming appointments, etc.  Non-urgent messages can be sent to your provider as well.   To learn more about what you can do with MyChart, go to [email or web address].            Quality Sleep Information, Adult Quality sleep is important for your mental and physical health. It also improves your quality of life. Quality sleep means you: Are asleep for most of the time you are in bed. Fall asleep within 30 minutes. Wake up no more than once a night.  Are awake for no longer than 20 minutes if you do wake up during the night. Most adults need 7-8 hours of quality sleep each night. How can poor sleep affect me? If you do not get enough quality sleep, you may have: Mood swings. Daytime sleepiness. Confusion. Decreased reaction time. Sleep disorders, such as insomnia and sleep  apnea. Difficulty with: Solving problems. Coping with stress. Paying attention. These issues may affect your performance and productivity at work, school, and at home. Lack of sleep may also put you at higher risk for accidents, suicide, and risky behaviors. If you do not get quality sleep you may also be at higher risk for several health problems, including: Infections. Type 2 diabetes. Heart disease. High blood pressure. Obesity. Worsening of long-term conditions, like arthritis, kidney disease, depression, Parkinson's disease, and epilepsy. What actions can I take to get more quality sleep?   Stick to a sleep schedule. Go to sleep and wake up at about the same time each day. Do not try to sleep less on weekdays and make up for lost sleep on weekends. This does not work. Try to get about 30 minutes of exercise on most days. Do not exercise 2-3 hours before going to bed. Limit naps during the day to 30 minutes or less. Do not use any products that contain nicotine or tobacco, such as cigarettes or e-cigarettes. If you need help quitting, ask your health care provider. Do not drink caffeinated beverages for at least 8 hours before going to bed. Coffee, tea, and some sodas contain caffeine. Do not drink alcohol close to bedtime. Do not eat large meals close to bedtime. Do not take naps in the late afternoon. Try to get at least 30 minutes of sunlight every day. Morning sunlight is best. Make time to relax before bed. Reading, listening to music, or taking a  hot bath promotes quality sleep. Make your bedroom a place that promotes quality sleep. Keep your bedroom dark, quiet, and at a comfortable room temperature. Make sure your bed is comfortable. Take out sleep distractions like TV, a computer, smartphone, and bright lights. If you are lying awake in bed for longer than 20 minutes, get up and do a relaxing activity until you feel sleepy. Work with your health care provider to treat medical  conditions that may affect sleeping, such as: Nasal obstruction. Snoring. Sleep apnea and other sleep disorders. Talk to your health care provider if you think any of your prescription medicines may cause you to have difficulty falling or staying asleep. If you have sleep problems, talk with a sleep consultant. If you think you have a sleep disorder, talk with your health care provider about getting evaluated by a specialist. Where to find more information [place] website: https://sleepfoundation.org National Heart, Lung, and [place] ([place]): http://www.saunders.info/.pdf Centers for Disease Control and Prevention (CDC): LearningDermatology.pl Contact a health care provider if you: Have trouble getting to sleep or staying asleep. Often wake up very early in the morning and cannot get back to sleep. Have daytime sleepiness. Have daytime sleep attacks of suddenly falling asleep and sudden muscle weakness (narcolepsy). Have a tingling sensation in your legs with a strong urge to move your legs (restless legs syndrome). Stop breathing briefly during sleep (sleep apnea). Think you have a sleep disorder or are taking a medicine that is affecting your quality of sleep. Summary Most adults need 7-8 hours of quality sleep each night. Getting enough quality sleep is an important part of health and well-being. Make your bedroom a place that promotes quality sleep and avoid things that may cause you to have poor sleep, such as alcohol, caffeine, smoking, and large meals. Talk to your health care provider if you have trouble falling asleep or staying asleep. This information is not intended to replace advice given to you by your health care provider. Make sure you discuss any questions you have with your health care provider. Document Revised: 04/25/2017 Document Reviewed: 04/25/2017 Elsevier Patient Education  [place].

## 2020-10-28 ENCOUNTER — Ambulatory Visit (INDEPENDENT_AMBULATORY_CARE_PROVIDER_SITE_OTHER): Payer: Medicare Other | Admitting: Family Medicine

## 2020-10-28 ENCOUNTER — Encounter: Payer: Self-pay | Admitting: Family Medicine

## 2020-10-28 ENCOUNTER — Other Ambulatory Visit: Payer: Self-pay

## 2020-10-28 ENCOUNTER — Ambulatory Visit: Payer: Medicare Other | Attending: Internal Medicine

## 2020-10-28 VITALS — BP 120/70 | HR 59 | Temp 97.7°F | Resp 16 | Wt 168.6 lb

## 2020-10-28 DIAGNOSIS — M810 Age-related osteoporosis without current pathological fracture: Secondary | ICD-10-CM | POA: Diagnosis not present

## 2020-10-28 DIAGNOSIS — R002 Palpitations: Secondary | ICD-10-CM

## 2020-10-28 DIAGNOSIS — I10 Essential (primary) hypertension: Secondary | ICD-10-CM | POA: Diagnosis not present

## 2020-10-28 DIAGNOSIS — R4189 Other symptoms and signs involving cognitive functions and awareness: Secondary | ICD-10-CM

## 2020-10-28 DIAGNOSIS — E782 Mixed hyperlipidemia: Secondary | ICD-10-CM

## 2020-10-28 DIAGNOSIS — R7303 Prediabetes: Secondary | ICD-10-CM

## 2020-10-28 DIAGNOSIS — F418 Other specified anxiety disorders: Secondary | ICD-10-CM | POA: Diagnosis not present

## 2020-10-28 DIAGNOSIS — Z23 Encounter for immunization: Secondary | ICD-10-CM

## 2020-10-28 DIAGNOSIS — E559 Vitamin D deficiency, unspecified: Secondary | ICD-10-CM | POA: Diagnosis not present

## 2020-10-28 HISTORY — DX: Palpitations: R00.2

## 2020-10-28 LAB — LDL CHOLESTEROL, DIRECT: Direct LDL: 78 mg/dL

## 2020-10-28 LAB — CBC
HCT: 35.8 % — ABNORMAL LOW (ref 36.0–46.0)
Hemoglobin: 11.4 g/dL — ABNORMAL LOW (ref 12.0–15.0)
MCHC: 31.9 g/dL (ref 30.0–36.0)
MCV: 84.9 fl (ref 78.0–100.0)
Platelets: 230 10*3/uL (ref 150.0–400.0)
RBC: 4.22 Mil/uL (ref 3.87–5.11)
RDW: 16.7 % — ABNORMAL HIGH (ref 11.5–15.5)
WBC: 7.4 10*3/uL (ref 4.0–10.5)

## 2020-10-28 LAB — LIPID PANEL
Cholesterol: 152 mg/dL (ref 0–200)
HDL: 52.2 mg/dL (ref >39.00)
NonHDL: 99.85
Total CHOL/HDL Ratio: 3
Triglycerides: 207 mg/dL — ABNORMAL HIGH (ref 0.0–149.0)
VLDL: 41.4 mg/dL — ABNORMAL HIGH (ref 0.0–40.0)

## 2020-10-28 LAB — COMPREHENSIVE METABOLIC PANEL
ALT: 19 U/L (ref 0–35)
AST: 21 U/L (ref 0–37)
Albumin: 4 g/dL (ref 3.5–5.2)
Alkaline Phosphatase: 107 U/L (ref 39–117)
BUN: 15 mg/dL (ref 6–23)
CO2: 31 mEq/L (ref 19–32)
Calcium: 9.4 mg/dL (ref 8.4–10.5)
Chloride: 106 mEq/L (ref 96–112)
Creatinine, Ser: 0.82 mg/dL (ref 0.40–1.20)
GFR: 69.49 mL/min (ref >60.00)
Glucose, Bld: 94 mg/dL (ref 70–99)
Potassium: 4.1 mEq/L (ref 3.5–5.1)
Sodium: 144 mEq/L (ref 135–145)
Total Bilirubin: 0.3 mg/dL (ref 0.2–1.2)
Total Protein: 6.3 g/dL (ref 6.0–8.3)

## 2020-10-28 LAB — TSH: TSH: 3.11 u[IU]/mL (ref 0.35–5.50)

## 2020-10-28 LAB — HEMOGLOBIN A1C: Hgb A1c MFr Bld: 6.1 % (ref 4.6–6.5)

## 2020-10-28 LAB — VITAMIN D 25 HYDROXY (VIT D DEFICIENCY, FRACTURES): VITD: 45.39 ng/mL (ref 30.00–100.00)

## 2020-10-28 MED ORDER — VENLAFAXINE HCL ER 75 MG PO CP24: 225.0000 mg | ORAL_CAPSULE | Freq: Every day | ORAL | 1 refills | Status: DC

## 2020-10-28 NOTE — Assessment & Plan Note (Addendum)
Has been seeing cardiology, having daily palpitations they placed her on Diltiazem 30 mg tabs to take prn up to 4 a day. She has been taking it every day and often 4 of them, took some last night but continues to have daily symptoms. She is encouraged to take 1 tab po tid scheduled and only use the 4th tab prn and to call cardiology if symptoms persist.

## 2020-10-28 NOTE — Progress Notes (Signed)
Patient ID: Shannon [name], female    DOB: [birth year]  Age: 75 y.o. MRN: [id number]    Subjective:   Chief Complaint  Patient presents with   Follow-up    Pt would like to discuss concerns.    Subjective   HPI Shannon [name] presents for office visit today for follow up on HTN and palpitations. She is following up with cardiologist regarding heart monitor that was instructed by Dr. Larose Kells. She is still experiencing palpitations under heart monitor and with diltiazem 30 MG prn. She took 2 pills yesterday. She has a sleep study planned. She states that frequency is everyday and episodes lasting for 30 minutes. She denies any diaphoresis, falls, syncope or SOB, but endorses numbness in extremities bilaterally and chest discomfort. Denies CP/SOB/HA/congestion/fevers/GI or GU c/o. Taking meds as prescribed.  She is concerned that she is now experiencing memory issues that her daughter also noticed. She scheduled testing and evaluation with Dr. Tomi Likens Neurology. She takes venlafaxine XR 75 mg (150 mg) BID PO. She is interested in seeing a counselor as well.   Review of Systems  Constitutional:  Negative for chills, fatigue and fever.  HENT:  Negative for congestion, rhinorrhea, sinus pressure, sinus pain and sore throat.   Eyes:  Negative for pain.  Respiratory:  Negative for cough and shortness of breath.   Cardiovascular:  Positive for palpitations. Negative for chest pain and leg swelling.  Gastrointestinal:  Negative for abdominal pain, blood in stool, diarrhea, nausea and vomiting.  Genitourinary:  Negative for decreased urine volume, flank pain, frequency, vaginal bleeding and vaginal discharge.  Musculoskeletal:  Negative for back pain.  Neurological:  Negative for headaches.   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/2009   BRCA 2 NEGATIVE, CHEMO AND RADIATION X 1 YR   HYPERLIPIDEMIA 08/11/2008   HYPERTENSION 08/11/2008   Hyponatremia 08/28/2011   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    Melanoma ([place]) 2008   DR. Ronnald Ramp   Neuropathy    NICM (nonischemic cardiomyopathy) ([place]) 03/08/2010   likely 2/2 chemotx - a. Echo 2012: EF 45-50%;  b. Lex MV 2/12:  low risk, apical defect (small area of ischemia vs shifting breast atten);  c.  Echo 7/12: Normal wall thickness, EF 60-65%, normal wall motion, grade 1 diastolic dysfunction, mild LAE, PASP 32;   d. Lex MV 11/13:  EF 76%, no ischemia   OA (  osteoarthritis) 09/07/2011   Obesity 09/28/2014   Osteoporosis    Personal history of chemotherapy 2012   Personal history of radiation therapy 2012   PREDIABETES 06/15/2009   Recurrent falls 12/28/2015   Shortness of breath    UTI (lower urinary tract infection) 03/03/2012   Vertebral fracture, osteoporotic, sequela 04/05/2016    She has a past surgical history that includes Cholecystectomy; Carpal tunnel release; Elbow surgery; Tubal ligation (1997); Lipoma excision (03/28/2009); Portacath placement (may 2011); Craniotomy for tumor (2000); Tonsillectomy (1958); Melanoma excision; porta cath; Port-a-cath  removal (11/30/2010); total knee raplacement (01-2012); Total knee arthroplasty (02/05/2012); Total knee arthroplasty (Left, 02/10/2013); left heart catheterization with coronary angiogram (N/A, 12/16/2013); Breast reduction surgery (Bilateral, 05/04/2014); Knee arthroscopy (Left, 10/12/2014); Synovectomy (Left, 10/12/2014); Kyphoplasty (N/A, 03/30/2016); Reduction mammaplasty (Bilateral); Breast lumpectomy (04/2009); and Total knee revision (Left, 07/14/2020).   Her family history includes Breast cancer in her mother; CVA in her son; Congestive Heart Failure in her father; Down syndrome in her son; Heart attack in her father; Heart disease in her maternal grandfather; Hypertension in her mother; Lung cancer in her mother; Prostate cancer in her brother and father; Stomach cancer in her maternal aunt; Uterine cancer in her maternal aunt.She reports that she has never smoked. She has never used smokeless tobacco. She reports that she does not drink alcohol and does not use drugs.  Current Outpatient Medications on File Prior to Visit  Medication Sig Dispense Refill   acetaminophen (TYLENOL) 500 MG tablet Take 1-2 tablets (500-1,000 mg total) by mouth every 8 (eight) hours as needed for moderate pain. 100 tablet 2   albuterol (VENTOLIN HFA) 108 (90 Base) MCG/ACT inhaler Inhale 1-2 puffs into the lungs every 6 (six) hours as needed for wheezing or shortness of breath. 18 g 3   AMBULATORY NON FORMULARY MEDICATION Nitroglycerin 0.125% gel applied topically to the rectum TID x 6-8 weeks 1 each 1   carvedilol (COREG) 25 MG tablet TAKE 1 TABLET TWICE DAILY WITH A MEAL (Patient taking differently: Take 25 mg by mouth 2 (two) times daily with a meal.) 180 tablet 1   cetirizine (ZYRTEC) 10 MG tablet Take 10 mg by mouth daily.     diltiazem (CARDIZEM) 30 MG tablet Take 1 tablet (30 mg total) by mouth 4 (four) times daily as needed (FOR PALPITATIONS- DO NOT TAKE MORE THAN 4 DOSES IN 24 HOURS). MAY TAKE 46m (1) TABLET AS NEEDED  FOR PALPITATIONS, DO NOT TAKE MORE THAN 4 DOSES IN 24 HOURS 120 tablet 3   diphenhydrAMINE (BENADRYL) 25 mg capsule Take 25 mg by mouth at bedtime. As needed     donepezil (ARICEPT) 10 MG tablet TAKE 1 TABLET (10 MG TOTAL) BY MOUTH AT BEDTIME. 90 tablet 0   ergocalciferol (VITAMIN D2) 1.25 MG (50000 UT) capsule Take 50,000 Units by mouth once a week.     famotidine (PEPCID) 20 MG tablet TAKE 1 TABLET BY MOUTH TWICE A DAY (Patient taking differently: Take 20 mg by mouth 2 (two) times daily.) 180 tablet 1   ferrous sulfate 325 (65 FE) MG tablet Take 325 mg by mouth daily with breakfast.     folic acid (FOLVITE) 1 MG tablet Take 1 tablet (1 mg total) by mouth daily. (Patient taking differently: Take 1 mg by mouth at bedtime.) 90 tablet 0   gabapentin (NEURONTIN) 300 MG capsule Take 1 capsule (300 mg total) by mouth 2 (two) times daily as needed. (Patient taking differently: Take 300 mg by mouth 2 (two) times  daily.) 180 capsule 0   hydrOXYzine (ATARAX/VISTARIL) 25 MG tablet Take 25 mg by mouth at bedtime.     methocarbamol (ROBAXIN) 500 MG tablet Take 1 tablet (500 mg total) by mouth every 6 (six) hours as needed for muscle spasms. 40 tablet 0   Multiple Vitamin (MULTIVITAMIN WITH MINERALS) TABS tablet Take 1 tablet by mouth daily.     pentosan polysulfate (ELMIRON) 100 MG capsule Take 100 mg by mouth 2 (two) times daily.      rosuvastatin (CRESTOR) 20 MG tablet TAKE 1 TABLET AT BEDTIME 90 tablet 3   telmisartan (MICARDIS) 80 MG tablet TAKE 1 TABLET EVERY DAY (Patient taking differently: Take 40 mg by mouth in the morning.) 90 tablet 3   No current facility-administered medications on file prior to visit.     Objective:  Objective  Physical Exam Constitutional:      General: She is not in acute distress.    Appearance: Normal appearance. She is not ill-appearing or toxic-appearing.  HENT:     Head: Normocephalic and atraumatic.     Right Ear: Tympanic membrane, ear canal and external ear  normal.     Left Ear: Tympanic membrane, ear canal and external ear normal.     Nose: No congestion or rhinorrhea.  Eyes:     Extraocular Movements: Extraocular movements intact.     Pupils: Pupils are equal, round, and reactive to light.  Cardiovascular:     Rate and Rhythm: Normal rate and regular rhythm.     Pulses: Normal pulses.     Heart sounds: Normal heart sounds. No murmur heard. Pulmonary:     Effort: Pulmonary effort is normal. No respiratory distress.     Breath sounds: Normal breath sounds. No wheezing, rhonchi or rales.  Abdominal:     General: Bowel sounds are normal.     Palpations: Abdomen is soft. There is no mass.     Tenderness: There is no abdominal tenderness. There is no guarding.     Hernia: No hernia is present.  Musculoskeletal:        General: Normal range of motion.     Cervical back: Normal range of motion and neck supple.  Skin:    General: Skin is warm and dry.  Neurological:     Mental Status: She is alert and oriented to person, place, and time.  Psychiatric:        Behavior: Behavior normal.   BP 120/70   Pulse (!) 59   Temp 97.7 F (36.5 C)   Resp 16   Wt 168 lb 9.6 oz (76.5 kg)   LMP 01/30/1994   SpO2 98%   BMI 32.93 kg/m  Wt Readings from Last 3 Encounters:  10/28/20 168 lb 9.6 oz (76.5 kg)  10/25/20 168 lb (76.2 kg)  10/15/20 167 lb 3.2 oz (75.8 kg)     Lab Results  Component Value Date   WBC 7.4 10/28/2020   HGB 11.4 (L) 10/28/2020   HCT 35.8 (L) 10/28/2020   PLT 230.0 10/28/2020   GLUCOSE 94 10/28/2020   CHOL 152 10/28/2020   TRIG 207.0 (H) 10/28/2020   HDL 52.20 10/28/2020   LDLDIRECT 78.0 10/28/2020   LDLCALC 80 06/05/2018   ALT 19 10/28/2020   AST 21 10/28/2020   NA 144 10/28/2020   K 4.1 10/28/2020   CL 106 10/28/2020   CREATININE 0.82 10/28/2020   BUN 15 10/28/2020   CO2 31 10/28/2020   TSH 3.11 10/28/2020   INR 1.0  07/12/2020   HGBA1C 6.1 10/28/2020    DG Bone Density  Result Date: 09/23/2020 EXAM:  DUAL X-RAY ABSORPTIOMETRY (DXA) FOR BONE MINERAL DENSITY IMPRESSION: Angee Gupton A Xzavian Semmel Your patient Marthann Abshier completed a BMD test on 09/23/2020 using the [place] (analysis version: 16.SP2) manufactured by [employer]. The following summarizes the results of our evaluation. [facility] PATIENT: Name: Delphina, Schum Patient ID: [id number] Birth Date: August 17, 1945 Height: 60.0 in. Gender: Female Measured: 09/23/2020 Weight: 166.0 lbs. Indications: Advanced Age, Caucasian, Chemotherapy for Cancer, Estrogen Deficiency, Height Loss, History of Osteopenia, Post Menopausal Fractures: Cheek, Thoracic Spine Treatments: Calcium, Chemo, HRT, Levothyroxine, Multivitamin, Vitamin D ASSESSMENT: The BMD measured at Femur Neck Right is 0.619 g/cm2 with a T-score of -3.0. This patient is considered osteoporotic according to [facility]) criteria. L-1 & 2 was excluded due to degenerative changes. Compared with the prior study on, 01/10/2018 the BMD of the total mean shows a statistically significant decrease. The scan quality is good. Site Region Measured Date Measured Age WHO YA BMD Classification T-score AP Spine L3-L4 09/23/2020 76.2 Normal -0.8 1.109 g/cm2 AP Spine L3-L4 01/10/2018 73.5 Normal -0.6 1.126 g/cm2 DualFemur Neck Right 09/23/2020 76.2 Osteoporosis -3.0 0.619 g/cm2 DualFemur Neck Right 01/10/2018 73.5 Osteopenia -2.4 0.704 g/cm2 DualFemur Total Mean 09/23/2020 76.2 Osteopenia -2.0 0.755 g/cm2 DualFemur Total Mean 01/10/2018 73.5 Osteopenia -1.5 0.813 g/cm2 Left Forearm Radius 33% 09/23/2020 76.2 Osteopenia -1.8 0.720 g/cm2 Left Forearm Radius 33% 01/10/2018 73.5 Osteopenia -1.4 0.753 g/cm2 World Health Organization [facility]) criteria for post-menopausal, Caucasian Women: Normal       T-score at or above -1 SD Osteopenia   T-score between -1 and -2.5 SD Osteoporosis T-score at or below -2.5 SD RECOMMENDATION: 1. All patients should optimize calcium and vitamin D intake. 2. Consider FDA-approved  medical therapies in postmenopausal women and men aged 26 years and older, based on the following: a. A hip or vertebral(clinical or morphometric) fracture. b. T-Score < -2.5 at the femoral neck or spine after appropriate evaluation to exclude secondary causes c. Low bone mass (T-score between -1.0 and -2.5 at the femoral neck or spine) and a 10 year probability of a hip fracture >3% or a 10 year probability of major osteoporosis-related fracture > 20% based on the [place]-adapted WHO algorithm d. Clinical judgement and/or patient preferences may indicate treatment for people with 10-year fracture probabilities above or below these levels FOLLOW-UP: Patients with diagnosis of osteoporosis or at high risk for fracture should have regular bone mineral density tests. For patients eligible for Medicare, routine testing is allowed once every 2 years. The testing frequency can be increased to one year for patients who have rapidly progressing disease, those who are receiving or discontinuing medical therapy to restore bone mass, or have additional risk factors. I have reviewed this report, and agree with the above findings. [facility] Electronically Signed   By: Rolm Baptise M.D.   On: 09/23/2020 19:30     Assessment & Plan:  Plan    Meds ordered this encounter  Medications   venlafaxine XR (EFFEXOR-XR) 75 MG 24 hr capsule    Sig: Take 3 capsules (225 mg total) by mouth daily with breakfast.    Dispense:  270 capsule    Refill:  1     Problem List Items Addressed This Visit     Hyperlipidemia, mixed    Tolerating statin, encouraged heart healthy diet, avoid trans fats, minimize simple carbs and saturated fats. Increase exercise as tolerated  Relevant Orders   Lipid panel (Completed)   Depression with anxiety - Primary    Patient acknowledges worsening anhedonia, anxiety and notes even feeling angry at her recent decline in health and troubles with her memory. We will increase Venlafaxine  XR to 225 mg daily and reevaluate.      Relevant Medications   venlafaxine XR (EFFEXOR-XR) 75 MG 24 hr capsule   Other Relevant Orders   Ambulatory referral to [place] hypertension    Well controlled, no changes to meds. Encouraged heart healthy diet such as the DASH diet and exercise as tolerated.       Relevant Orders   CBC (Completed)   Comprehensive metabolic panel (Completed)   TSH (Completed)   Osteoporosis    Encouraged to get adequate exercise, calcium and vitamin d intake      Prediabetes    hgba1c acceptable, minimize simple carbs. Increase exercise as tolerated.       Relevant Orders   Hemoglobin A1c (Completed)   Vitamin D deficiency    Continue to Supplement and monitor      Relevant Orders   VITAMIN D 25 Hydroxy (Vit-D Deficiency, Fractures) (Completed)   Cognitive decline    She is seeing Neurology and is on Aricept but acknowledges her memory is worsening when she was offered neuropsych testing previously she declined but is willing to proceed now so new referral for this is placed.       Relevant Orders   Ambulatory referral to Neurology   Palpitation    Has been seeing cardiology, having daily palpitations they placed her on Diltiazem 30 mg tabs to take prn up to 4 a day. She has been taking it every day and often 4 of them, took some last night but continues to have daily symptoms. She is encouraged to take 1 tab po tid scheduled and only use the 4th tab prn and to call cardiology if symptoms persist.       Follow-up: Return in about 4 months (around 02/27/2021).  I, Suezanne Jacquet, acting as a scribe for Penni Homans, MD, have documented all relevent documentation on behalf of Penni Homans, MD, as directed by Penni Homans, MD while in the presence of Penni Homans, MD. DO:10/29/20.  I, Mosie Lukes, MD personally performed the services described in this documentation. All medical record entries made by the scribe were at my  direction and in my presence. I have reviewed the chart and agree that the record reflects my personal performance and is accurate and complete

## 2020-10-28 NOTE — Patient Instructions (Addendum)
Take the new covid shot when you can, [place] [facility] is giving it M-F 9-3  Learn something new   Paxlovid and Molnupiravir is the new COVID medication we can give you if you get COVID so make sure you test if you have symptoms because we have to treat by day 5 of symptoms for it to be effective. If you are positive let us know so we can treat. If a home test is negative and your symptoms are persistent get a PCR test. Can check testing locations at [email or web address] If you are positive we will make an appointment with [place] and we will send in Paxlovid or Molnupiravir if you would like it. Check with your pharmacy before we meet to confirm they have it in stock, if they do not then we can get the prescription at the [facility]

## 2020-10-29 NOTE — Progress Notes (Signed)
   Covid-19 Vaccination Clinic  Name:  Shannon [name]    MRN: [id number] DOB: 16-Nov-1945  10/29/2020  Shannon [name] was observed post Covid-19 immunization for 15 minutes without incident. She was provided with Vaccine Information Sheet and instruction to access the V-Safe system.   Shannon [name] was instructed to call 911 with any severe reactions post vaccine: Difficulty breathing  Swelling of face and throat  A fast heartbeat  A bad rash all over body  Dizziness and weakness

## 2020-10-29 NOTE — Assessment & Plan Note (Signed)
Well controlled, no changes to meds. Encouraged heart healthy diet such as the DASH diet and exercise as tolerated.  °

## 2020-10-29 NOTE — Assessment & Plan Note (Signed)
Continue to Supplement and monitor

## 2020-10-29 NOTE — Assessment & Plan Note (Signed)
She is seeing Neurology and is on Aricept but acknowledges her memory is worsening when she was offered neuropsych testing previously she declined but is willing to proceed now so new referral for this is placed.

## 2020-10-29 NOTE — Assessment & Plan Note (Signed)
Encouraged to get adequate exercise, calcium and vitamin d intake 

## 2020-10-29 NOTE — Assessment & Plan Note (Signed)
Patient acknowledges worsening anhedonia, anxiety and notes even feeling angry at her recent decline in health and troubles with her memory. We will increase Venlafaxine XR to 225 mg daily and reevaluate.

## 2020-10-29 NOTE — Assessment & Plan Note (Signed)
Tolerating statin, encouraged heart healthy diet, avoid trans fats, minimize simple carbs and saturated fats. Increase exercise as tolerated 

## 2020-10-29 NOTE — Assessment & Plan Note (Signed)
[id number] acceptable, minimize simple carbs. Increase exercise as tolerated.  

## 2020-11-04 ENCOUNTER — Other Ambulatory Visit: Payer: Self-pay

## 2020-11-04 DIAGNOSIS — D649 Anemia, unspecified: Secondary | ICD-10-CM

## 2020-11-05 ENCOUNTER — Other Ambulatory Visit: Payer: Self-pay | Admitting: *Deleted

## 2020-11-05 NOTE — Addendum Note (Signed)
Addended by: Kem Boroughs D on: 11/05/2020 02:37 PM   Modules accepted: Orders

## 2020-11-09 ENCOUNTER — Other Ambulatory Visit (HOSPITAL_BASED_OUTPATIENT_CLINIC_OR_DEPARTMENT_OTHER): Payer: Self-pay

## 2020-11-09 DIAGNOSIS — Z23 Encounter for immunization: Secondary | ICD-10-CM | POA: Diagnosis not present

## 2020-11-09 MED ORDER — COVID-19MRNA BIVAL VACC PFIZER 30 MCG/0.3ML IM SUSP
INTRAMUSCULAR | 0 refills | Status: DC
  Filled 2020-11-09: qty 0.3, 1d supply, fill #0

## 2020-11-10 ENCOUNTER — Telehealth: Payer: Self-pay | Admitting: General Practice

## 2020-11-10 MED ORDER — DILTIAZEM HCL 60 MG PO TABS: 60.0000 mg | ORAL_TABLET | Freq: Two times a day (BID) | ORAL | 6 refills | Status: DC

## 2020-11-10 NOTE — Telephone Encounter (Signed)
Pt c/o medication issue:  1. Name of Medication: diltiazem (CARDIZEM) 30 MG tablet  2. How are you currently taking this medication (dosage and times per day)? Take 1 tablet (30 mg total) by mouth 4 (four) times daily as needed (FOR PALPITATIONS- DO NOT TAKE MORE THAN 4 DOSES IN 24 HOURS). MAY TAKE 30mg  (1) TABLET AS NEEDED FOR PALPITATIONS, DO NOT TAKE MORE THAN 4 DOSES IN 24 HOURS  3. Are you having a reaction (difficulty breathing--STAT)? no  4. What is your medication issue? Calling to get clarification on how the medication is suppose to be taking. Please advise

## 2020-11-10 NOTE — Telephone Encounter (Signed)
Called pt she states that she does not know when to take her Diltiazem. Informed pt to take 60mg  BID. Verbalized understanding. Asked pt if she has been taking this way since her last appt a month ago. She states that she has been taking them as directed, she is just confused because the directions changed at her last appt from what the bottle says. She states that she has been taking BID(I am not confident of this speaking with pt-she is very confused). Pt assures me that she is taking BID. She will make sure to take and log BP, she will call if palpitations continue.

## 2020-11-15 ENCOUNTER — Telehealth: Payer: Self-pay

## 2020-11-15 NOTE — Telephone Encounter (Signed)
Pt and daughter made aware and verbalized understanding. Daughter questioning if pt can decreased back to 30 mg as needed. She feels the 60 mg is too strong and scared it will bottom pt's BP too much.    Deberah Pelton, NP  You 59 minutes ago (4:06 PM)   Please contact Ms. Castillo and reviewed the instructions for the prescription.  She should only be taking Cardizem 60 mg as needed twice daily for palpitations.  The medication should not be taken in a scheduled manner.  Please ask her to continue to monitor blood pressure.  She should not take the medication if her blood pressure is less than or equal to 715 systolic.  Thank you. [place]

## 2020-11-15 NOTE — Telephone Encounter (Signed)
Daughter called for clarification regarding diltiazem dose. She state she was under the assumption that pt was supposed to take it as needed but pt has been taking 60 mg BID. She also state pt prescription is wrote for BID. Daughter was concerned as pt's BP this morning was 80/40. Daughter state she is a Marine scientist and hold pt hold medication, increase fluid, and salt intake.   Per chart review, on 9/26 Shannon Stack, NP noted the following;   I will have her continue to monitor blood pressure, daily weights, increase her diltiazem to 60 mg twice daily as needed for palpitations and have her follow-up after her sleep study.    Nurse called pt to see if she rechecked BP. Pt state she has not and currently at a restaurant. Pt state she feels better so she assumes it has increased.   Will forward to NP for medication clarification along with parameters.

## 2020-11-15 NOTE — Telephone Encounter (Signed)
Daughter of the patient called. She would like someone from the office to call and discuss how the patient is going to take the medications with her directly. The patient has memory issues and it is easier to just talk to the patient's daughter directly

## 2020-11-15 NOTE — Telephone Encounter (Signed)
Patient is following up, requesting an additional call to clarify medication dose. Please return call when able.

## 2020-11-16 ENCOUNTER — Ambulatory Visit
Admission: RE | Admit: 2020-11-16 | Discharge: 2020-11-16 | Disposition: A | Discharge status: Home / Self Care | Payer: Medicare Other | Source: Ambulatory Visit | Attending: Adult Health | Admitting: Adult Health

## 2020-11-16 ENCOUNTER — Other Ambulatory Visit: Payer: Self-pay

## 2020-11-16 DIAGNOSIS — Z1231 Encounter for screening mammogram for malignant neoplasm of breast: Secondary | ICD-10-CM | POA: Diagnosis not present

## 2020-11-16 DIAGNOSIS — Z171 Estrogen receptor negative status [ER-]: Secondary | ICD-10-CM

## 2020-11-16 DIAGNOSIS — C50311 Malignant neoplasm of lower-inner quadrant of right female breast: Secondary | ICD-10-CM

## 2020-11-17 DIAGNOSIS — R3915 Urgency of urination: Secondary | ICD-10-CM | POA: Diagnosis not present

## 2020-11-17 DIAGNOSIS — C439 Malignant melanoma of skin, unspecified: Secondary | ICD-10-CM | POA: Insufficient documentation

## 2020-11-17 DIAGNOSIS — R350 Frequency of micturition: Secondary | ICD-10-CM | POA: Diagnosis not present

## 2020-11-17 DIAGNOSIS — R102 Pelvic and perineal pain: Secondary | ICD-10-CM | POA: Diagnosis not present

## 2020-11-17 DIAGNOSIS — R351 Nocturia: Secondary | ICD-10-CM | POA: Diagnosis not present

## 2020-11-17 DIAGNOSIS — N3010 Interstitial cystitis (chronic) without hematuria: Secondary | ICD-10-CM | POA: Diagnosis not present

## 2020-11-17 NOTE — Progress Notes (Signed)
    HPI: FU nonischemic cardiomyopathy. Patient had an echocardiogram in 2012 ejection fraction of 45-50%. Reduced LV function felt secondary to chemotherapy (herceptin). Cardiac catheterization November 2015 showed no obstructive coronary disease. Ejection fraction 50-55%. Carotid Dopplers January 2018 showed 1-39% bilateral stenosis.  Echo repeated December 2021 showed normal LV function, grade 1 diastolic dysfunction.  Monitor September 2022 showed normal sinus rhythm with short runs of SVT up to 29.5 seconds, rare PVC, PAC and ventricular couplet.  Seen recently with palpitations and Cardizem was increased.  Since last seen, she continues to have occasional palpitations.  She takes diltiazem as needed for this.  She denies dyspnea, chest pain or syncope.  No pedal edema.  Current Outpatient Medications  Medication Sig Dispense Refill   acetaminophen (TYLENOL) 500 MG tablet Take 1-2 tablets (500-1,000 mg total) by mouth every 8 (eight) hours as needed for moderate pain. 100 tablet 2   albuterol (VENTOLIN HFA) 108 (90 Base) MCG/ACT inhaler Inhale 1-2 puffs into the lungs every 6 (six) hours as needed for wheezing or shortness of breath. 18 g 3   AMBULATORY NON FORMULARY MEDICATION Nitroglycerin 0.125% gel applied topically to the rectum TID x 6-8 weeks 1 each 1   carvedilol (COREG) 25 MG tablet TAKE 1 TABLET TWICE DAILY WITH A MEAL (Patient taking differently: Take 25 mg by mouth 2 (two) times daily with a meal.) 180 tablet 1   cetirizine (ZYRTEC) 10 MG tablet Take 10 mg by mouth daily.     COVID-19 mRNA bivalent vaccine, Pfizer, injection Inject into the muscle. 0.3 mL 0   diltiazem (CARDIZEM) 60 MG tablet Take 1 tablet (60 mg total) by mouth 2 (two) times daily. 60 tablet 6   diphenhydrAMINE (BENADRYL) 25 mg capsule Take 25 mg by mouth at bedtime. As needed     donepezil (ARICEPT) 10 MG tablet TAKE 1 TABLET (10 MG TOTAL) BY MOUTH AT BEDTIME. 90 tablet 0   ergocalciferol (VITAMIN D2) 1.25 MG  (50000 UT) capsule Take 50,000 Units by mouth once a week.     famotidine (PEPCID) 20 MG tablet TAKE 1 TABLET BY MOUTH TWICE A DAY (Patient taking differently: Take 20 mg by mouth 2 (two) times daily.) 180 tablet 1   ferrous sulfate 325 (65 FE) MG tablet Take 325 mg by mouth daily with breakfast.     folic acid (FOLVITE) 1 MG tablet Take 1 tablet (1 mg total) by mouth daily. (Patient taking differently: Take 1 mg by mouth at bedtime.) 90 tablet 0   gabapentin (NEURONTIN) 300 MG capsule Take 1 capsule (300 mg total) by mouth 2 (two) times daily as needed. (Patient taking differently: Take 300 mg by mouth 2 (two) times daily.) 180 capsule 0   hydrOXYzine (ATARAX/VISTARIL) 25 MG tablet Take 25 mg by mouth at bedtime.     methocarbamol (ROBAXIN) 500 MG tablet Take 1 tablet (500 mg total) by mouth every 6 (six) hours as needed for muscle spasms. 40 tablet 0   Multiple Vitamin (MULTIVITAMIN WITH MINERALS) TABS tablet Take 1 tablet by mouth daily.     pentosan polysulfate (ELMIRON) 100 MG capsule Take 100 mg by mouth 2 (two) times daily.      rosuvastatin (CRESTOR) 20 MG tablet TAKE 1 TABLET AT BEDTIME 90 tablet 3   telmisartan (MICARDIS) 80 MG tablet TAKE 1 TABLET EVERY DAY (Patient taking differently: Take 40 mg by mouth in the morning.) 90 tablet 3   venlafaxine XR (EFFEXOR-XR) 75 MG 24 hr capsule Take   3 capsules (225 mg total) by mouth daily with breakfast. 270 capsule 1   No current facility-administered medications for this visit.     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/2009   BRCA 2 NEGATIVE, CHEMO AND RADIATION X 1 YR   HYPERLIPIDEMIA 08/11/2008   HYPERTENSION 08/11/2008   Hyponatremia 08/28/2011   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    Melanoma ([facility]) 2008   DR. Ronnald Ramp   Neuropathy    NICM (nonischemic cardiomyopathy) ([place]) 03/08/2010   likely 2/2 chemotx - a. Echo 2012: EF 45-50%;  b. Lex MV 2/12:  low risk, apical defect (small area of ischemia vs shifting breast atten);  c.  Echo 7/12: Normal wall thickness, EF 60-65%, normal wall motion, grade 1 diastolic dysfunction, mild LAE, PASP 32;   d. Lex MV 11/13:  EF 76%, no ischemia   OA (osteoarthritis) 09/07/2011   Obesity 09/28/2014   Osteoporosis    Personal history of chemotherapy 2012   Personal history of radiation therapy 2012   PREDIABETES 06/15/2009   Recurrent falls 12/28/2015   Shortness of breath    UTI (lower urinary tract infection) 03/03/2012   Vertebral fracture, osteoporotic, sequela 04/05/2016    Past Surgical History:  Procedure Laterality Date   BREAST LUMPECTOMY  04/2009   RIGHT FOR BREAST CANCER-CHEMO/RADIATION X 1 YEAR   BREAST REDUCTION SURGERY Bilateral 05/04/2014   Procedure: MAMMARY REDUCTION  (BREAST);  Surgeon: Cristine Polio, MD;  Location: [place];  Service: Plastics;  Laterality: Bilateral;   CARPAL TUNNEL RELEASE     L wrist, ulnar nerve moved   CHOLECYSTECTOMY     CRANIOTOMY FOR TUMOR  2000   ELBOW SURGERY     left   KNEE ARTHROSCOPY Left 10/12/2014   Procedure: LEFT KNEE ARTHROSCOPY ;   Surgeon: Gaynelle Arabian, MD;  Location: [facility];  Service: Orthopedics;  Laterality: Left;   KYPHOPLASTY N/A 03/30/2016   Procedure: THORACIC 12 KYPHOPLASTY;  Surgeon: Phylliss Bob, MD;  Location: [place];  Service: Orthopedics;  Laterality: N/A;  THORACIC 12 KYPHOPLASTY   LEFT HEART CATHETERIZATION WITH CORONARY ANGIOGRAM N/A 12/16/2013   Procedure: LEFT HEART CATHETERIZATION WITH CORONARY ANGIOGRAM;  Surgeon: Peter M [name], MD;  Location: [facility];  Service: Cardiovascular;  Laterality: N/A;   LIPOMA EXCISION  03/28/2009   right leg   MELANOMA EXCISION     PORT-A-CATH REMOVAL  11/30/2010  [place]   porta cath     [facility] PLACEMENT  may 2011   REDUCTION MAMMAPLASTY Bilateral    SYNOVECTOMY Left 10/12/2014   Procedure: WITH SYNOVECTOMY;  Surgeon: Gaynelle Arabian, MD;  Location: [facility];  Service: Orthopedics;  Laterality: Left;   TONSILLECTOMY  1958   TOTAL KNEE ARTHROPLASTY  02/05/2012   Procedure: TOTAL KNEE ARTHROPLASTY;  Surgeon: Gearlean Alf, MD;  Location: [facility];  Service: Orthopedics;  Laterality: Right;   TOTAL KNEE ARTHROPLASTY Left 02/10/2013   Procedure: LEFT TOTAL KNEE ARTHROPLASTY;  Surgeon: Gearlean Alf, MD;  Location: [facility];  Service: Orthopedics;  Laterality: Left;   total knee raplacement  01-2012   Right Knee   TOTAL KNEE REVISION Left 07/14/2020   Procedure: TOTAL KNEE REVISION;  Surgeon: Gaynelle Arabian, MD;  Location: [facility];  Service: Orthopedics;  Laterality: Left;   TUBAL LIGATION  1997    Social History   Socioeconomic History   Marital status: Married    Spouse name: Deidre Ala   Number of children: 3   Years of education: Not on file   Highest education level: Not on file  Occupational History   Occupation: boutique owner-retired    Employer: [employer]  Tobacco Use   Smoking status: Never   Smokeless tobacco: Never  Vaping Use   Vaping Use: Never used  Substance and Sexual Activity   Alcohol use: No   Drug use: No   Sexual activity: Not  Currently  Other Topics Concern   Not on file  Social History Narrative   Patient is right-handed. She lives with her husband in a 4 story home. They take care of their grown son with down syndrome who has had a stroke. She drinks 1-2 glasses of tea in the evenings. She does not formally exercise.   Social Determinants of Health   Financial Resource Strain: Not on file  Food Insecurity: Not on file  Transportation Needs: Not on file  Physical Activity: Not on file  Stress: Not on file  Social Connections: Not on file  Intimate Partner Violence: Not on file    Family History  Problem Relation Age of Onset   Breast cancer Mother        sarcoma   Lung cancer Mother    Hypertension Mother    Prostate cancer Father    Congestive Heart Failure Father    Heart attack Father    Prostate cancer Brother    Down syndrome Son    CVA Son    Heart disease Maternal Grandfather        MI   Stomach cancer Maternal Aunt    Uterine cancer Maternal Aunt    Colon cancer Neg Hx     ROS: Some problems with memory/cognition but no fevers or chills, productive cough, hemoptysis, dysphasia, odynophagia, melena, hematochezia, dysuria, hematuria, rash, seizure activity, orthopnea, PND, pedal edema, claudication. Remaining systems are negative.  Physical Exam: Well-developed well-nourished in no acute distress.  Skin is warm and dry.  HEENT is normal.  Neck is supple.  Chest is clear to auscultation with normal expansion.  Cardiovascular exam is regular rate and rhythm.  Abdominal exam nontender or distended. No masses palpated. Extremities show no edema. neuro grossly intact  ECG-normal sinus rhythm at a rate of 65, no ST changes.  Personally reviewed  A/P  1 nonischemic cardiomyopathy-LV function normal on most recent echocardiogram.  We will continue carvedilol but discontinue telmisartan (she is having increased palpitations and I am  initiating Cardizem to see if this helps).  2  palpitations-patient continues to have occasional palpitations.  Discontinue Cardizem as needed.  Continue carvedilol and add Cardizem CD 120 mg daily.  Follow blood pressure and pulse.  3 hypertension-patient's blood pressure is controlled.  Medication changes as outlined above.  4 hyperlipidemia-continue statin.  5 chronic diastolic congestive heart failure-patient is euvolemic.  Continue fluid restriction and low-sodium diet.  Kirk Ruths, MD

## 2020-11-22 NOTE — Telephone Encounter (Signed)
Left message to call back  

## 2020-11-22 NOTE — Telephone Encounter (Signed)
Patient's daughter is returning call.

## 2020-11-22 NOTE — Telephone Encounter (Signed)
Disregard previous note

## 2020-11-22 NOTE — Telephone Encounter (Signed)
Called patient back- gave results.  They asked to reset the mychart account-  I have tried to do this, they will let us know of any issues.   Will route to RN as Juluis Rainier. Thanks!

## 2020-11-23 ENCOUNTER — Other Ambulatory Visit: Payer: Self-pay

## 2020-11-23 ENCOUNTER — Telehealth: Payer: Self-pay | Admitting: Cardiology

## 2020-11-23 ENCOUNTER — Ambulatory Visit (INDEPENDENT_AMBULATORY_CARE_PROVIDER_SITE_OTHER): Payer: Medicare Other | Admitting: Cardiology

## 2020-11-23 ENCOUNTER — Encounter: Payer: Self-pay | Admitting: Cardiology

## 2020-11-23 VITALS — BP 130/70 | HR 65 | Ht 60.0 in | Wt 171.4 lb

## 2020-11-23 DIAGNOSIS — I10 Essential (primary) hypertension: Secondary | ICD-10-CM

## 2020-11-23 DIAGNOSIS — R002 Palpitations: Secondary | ICD-10-CM

## 2020-11-23 DIAGNOSIS — E782 Mixed hyperlipidemia: Secondary | ICD-10-CM

## 2020-11-23 MED ORDER — DILTIAZEM HCL ER COATED BEADS 120 MG PO CP24: 120.0000 mg | ORAL_CAPSULE | Freq: Every day | ORAL | 3 refills | Status: DC

## 2020-11-23 MED ORDER — DILTIAZEM HCL ER COATED BEADS 120 MG PO CP24
120.0000 mg | ORAL_CAPSULE | Freq: Every day | ORAL | 3 refills | Status: DC
Start: 2020-11-23 — End: 2021-05-04

## 2020-11-23 NOTE — Telephone Encounter (Signed)
Patient was seen 11/23/20

## 2020-11-23 NOTE — Patient Instructions (Signed)
Medication Instructions:   STOP AS NEEDED DILTIAZEM  STOP TELMISARTAN  START CARDIZEM CD 120 MG ONCE DAILY  *If you need a refill on your cardiac medications before your next appointment, please call your pharmacy*   Follow-Up: At [facility], you and your health needs are our priority.  As part of our continuing mission to provide you with exceptional heart care, we have created designated Provider Care Teams.  These Care Teams include your primary Cardiologist (physician) and Advanced Practice Providers (APPs -  Physician Assistants and Nurse Practitioners) who all work together to provide you with the care you need, when you need it.  We recommend signing up for the patient portal called "MyChart".  Sign up information is provided on this After Visit Summary.  MyChart is used to connect with patients for Virtual Visits (Telemedicine).  Patients are able to view lab/test results, encounter notes, upcoming appointments, etc.  Non-urgent messages can be sent to your provider as well.   To learn more about what you can do with MyChart, go to [email or web address].    Your next appointment:   6 month(s)  The format for your next appointment:   In Person  Provider:   Kirk Ruths, MD

## 2020-11-23 NOTE — Telephone Encounter (Signed)
Daughter is calling stating her mom is coming in today in regards to her diltiazem. She states Shannon [name] is having some memory issues and Shannon [name] is concerned due to her being on a PRN medicine. She is worried she will not take it correctly and doesn't want this to cause problems. She would like a callback on what Dr. Stanford Breed decided at [facility] appointment due to this. Shannon [name] is not able to make it to he moms appointment today due to a mandatory training at work.

## 2020-11-23 NOTE — Telephone Encounter (Signed)
Spoke to patient's daughter Steffanie Dunn she stated mother has appointment with Dr.Crenshaw this afternoon.Stated she will not be able to come with her.Stated she wanted to make Dr.Crenshaw aware her mother has a memory problem.She is starting to be forgetful and gets things mixed up.She will be having a neurology work up.Stated she prepares mother's pill box for the week,but she takes Diltiazem as needed.She is concerned and does not think she needs to take as needed.She took a Diltiazem last week and B/P too low.She would like a call after her appointment.Advised I will send message to [facility] and his RN.

## 2020-11-23 NOTE — Telephone Encounter (Signed)
Spoke with pt daughter

## 2020-11-23 NOTE — Addendum Note (Signed)
Addended by: Cristopher Estimable on: 11/23/2020 03:23 PM   Modules accepted: Orders

## 2020-11-24 ENCOUNTER — Other Ambulatory Visit: Payer: Self-pay

## 2020-11-25 ENCOUNTER — Other Ambulatory Visit: Payer: Self-pay

## 2020-11-25 DIAGNOSIS — I10 Essential (primary) hypertension: Secondary | ICD-10-CM

## 2020-12-01 ENCOUNTER — Other Ambulatory Visit: Payer: Self-pay

## 2020-12-01 ENCOUNTER — Ambulatory Visit: Payer: Medicare Other

## 2020-12-01 ENCOUNTER — Ambulatory Visit (INDEPENDENT_AMBULATORY_CARE_PROVIDER_SITE_OTHER): Payer: Medicare Other | Admitting: Psychology

## 2020-12-01 ENCOUNTER — Encounter: Payer: Self-pay | Admitting: Psychology

## 2020-12-01 DIAGNOSIS — F339 Major depressive disorder, recurrent, unspecified: Secondary | ICD-10-CM | POA: Diagnosis not present

## 2020-12-01 DIAGNOSIS — R3914 Feeling of incomplete bladder emptying: Secondary | ICD-10-CM | POA: Diagnosis not present

## 2020-12-01 DIAGNOSIS — F0670 Mild neurocognitive disorder due to known physiological condition without behavioral disturbance: Secondary | ICD-10-CM | POA: Diagnosis not present

## 2020-12-01 DIAGNOSIS — G309 Alzheimer's disease, unspecified: Secondary | ICD-10-CM | POA: Diagnosis not present

## 2020-12-01 DIAGNOSIS — R4189 Other symptoms and signs involving cognitive functions and awareness: Secondary | ICD-10-CM

## 2020-12-01 HISTORY — DX: Alzheimer's disease, unspecified: G30.9

## 2020-12-01 HISTORY — DX: Mild neurocognitive disorder due to known physiological condition without behavioral disturbance: F06.70

## 2020-12-01 NOTE — Progress Notes (Signed)
NEUROPSYCHOLOGICAL EVALUATION [place] Department of Neurology  Date of Evaluation: December 01, 2020  Reason for Referral:   Shannon [name] is a 75 y.o. right-handed Caucasian female referred by Metta Clines, D.O., to characterize her current cognitive functioning and assist with diagnostic clarity and treatment planning in the context of subjective cognitive decline and concerns for a neurodegenerative process.   Assessment and Plan:   Clinical Impression(s): Shannon [name]' pattern of performance is suggestive of significant impairment surrounding confrontation naming, as well as all aspects of verbal and visual memory. Mild performance variability was further exhibited across cognitive flexibility. Performance was appropriate across processing speed, attention/concentration, other aspects of executive functioning, safety/judgment, receptive language, verbal fluency, and visuospatial abilities. While medical records suggest that her daughter has taken over medication management due to frequent mistakes, Shannon [name] denied difficulties completing other instrumental activities of daily living (ADLs) independently. There is no collateral information to confirm or refute this at the present time. 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 However, I believe she is towards the severe end of this spectrum and is at risk for transitioning to a dementia designation in the next several years.   The most likely culprit for ongoing cognitive decline is unfortunately Alzheimer's disease. Across memory testing, Shannon [name] did not benefit from repeated exposure across learning trials and was essentially amnestic at a delay. Across yes/no recognition trials, she commented that she did not ever recall being presented a previous list of words, stories, or figure and stated that she was  simply guessing. As such, below average scores are likely inflated by happenstance guessing. Overall, memory patterns suggest a severe memory storage deficit, which is the hallmark characteristic of this disease process. Severe impairment in confrontation naming is also very consistent with this disease process. She does not display behavioral characteristics of Lewy body dementia or frontotemporal dementia at the present time. While prior neuroimaging suggested small vessel ischemic changes, these were said to be mild. While there could be a slight vascular contribution, this would not account for the extent of memory impairment. Mild to moderate symptoms of anxiety and depression may worsen her presentation but would also not account for this level of dysfunction. Continued medical monitoring will be important moving forward.   Recommendations: A repeat neuropsychological evaluation in 18-24 months (or sooner if functional decline is noted) is recommended to assess the trajectory of future cognitive decline should it occur. This will also aid in future efforts towards improved diagnostic clarity.  [name] has already been prescribed medication (i.e., donepezil/Aricept) which is commonly given to individuals with memory loss and concerns for Alzheimer's disease. She is encouraged to continue taking this medication as prescribed. It is important to highlight that this medication has been shown to slow functional decline in some individuals. There is no current treatment which is able to stop or reverse cognitive decline in the face of a neurodegenerative illness.   Should there be a progression of her current deficits over time, Shannon [name] is unlikely to regain any independent living skills lost. Therefore, it is recommended that she remain as involved as possible in all aspects of household chores, finances, and medication management, with supervision to ensure adequate performance. She will likely benefit  from the establishment and maintenance of a routine in order to maximize her functional abilities over time.  It will be important for Shannon [name] to have  another person with her when in situations where she may need to process information, weigh the pros and cons of different options, and make decisions, in order to ensure that she fully understands and recalls all information to be considered.  If not already done, Shannon [name] and her family may want to discuss her wishes regarding durable power of attorney and medical decision making, so that she can have input into these choices. Additionally, they may wish to discuss future plans for caretaking and seek out community options for in home/residential care should they become necessary.  Performance across neurocognitive testing is not a strong predictor of an individual's safety operating a motor vehicle. Should her family wish to pursue a formalized driving evaluation, they could reach out to the following agencies:   [employer] in [place]: [phone]  [facility]: [facility]: [place]: [phone] or [phone]  Shannon [name] is encouraged to attend to lifestyle factors for brain health (e.g., regular physical exercise, good nutrition habits, regular participation in cognitively-stimulating activities, and general stress management techniques), which are likely to have benefits for both emotional adjustment and cognition. Optimal control of vascular risk factors (including safe cardiovascular exercise and adherence to dietary recommendations) is encouraged. Continued participation in activities which provide mental stimulation and social interaction is also recommended.   Important information to remember should be provided in written format in all instances. This should be placed in a highly visible and commonly frequented location of her residence to try to help  promote recall.   Review of Records:   Shannon [name] was seen by [facility] Metta Clines, D.O.) on 12/25/2017 due to an episode of confusion with headache symptoms. About three weeks prior to that appointment, she developed body aches with diffuse weakness and daytime drowsiness. She felt unsteady on her feet. While driving, she noted she was veering off the road. B1, vitamin D, TSH, and B12 were unremarkable. She presented to the ED on 12/14/2017 for further evaluation. CBC, CMP, ammonia, and UA were unremarkable. Short-term memory dysfunction was first reported at this time. She reported difficulty keeping track of names and her medications.  Ms. Seith was most recently seen by Dr. Tomi Likens on 04/12/2020 for altered mental status. 2021 reportedly was a difficult year for her. She has been caring for her husband who has dementia and her adult son passed away. Headaches emerged in December 2021. These followed cataract surgery and it was originally thought that symptoms were caused by eye drops. However, these have seemingly persisted since stopping these drops. Symptoms were said to be severe and move to different locations. She also reported memory concerns which have been ongoing for the past several years, noticeable by both herself and her family. She may have trouble recalling recent events and repeat herself frequently during conversations. There was also an odd experience where Ms. Authier was reportedly found playing hide and seek with her granddaughter who was not actually there. Her daughter started managing medications as Ms. Labuda would reportedly forget to take her medications despite using a pill box. Trouble with driving or financial management was denied.   On 02/18/2020, she exhibited altered mental status. She thought her mother who is long passed was in the house cooking dinner. She went to the ED for further evaluation. Head CT was unremarkable. UA showed possible UTI and she was prescribed  Keflex. CXR was negative and labs were negative for leukocytosis or electrolyte imbalance. She was dehydrated.  Headaches were said to have become infrequent and mild, lasting a couple of hours about two days a week. Performance on a brief cognitive screening instrument (SLUMS) was 16/30. Ultimately, Ms. Win was referred for a comprehensive neuropsychological evaluation to characterize her cognitive abilities and to assist with diagnostic clarity and treatment planning.   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 Head CT on 06/24/1998 revealed postoperative changes surrounding her right frontotemporal craniotomy. More recently, brain MRI on 12/29/2017 revealed mild chronic small vessel ischemia. Head CT on 03/28/2019 was negative. Head CT on 02/18/2020 was unremarkable outside of mild generalized volume loss and mild small vessel ischemic changes. Brain MRI on 03/02/2020 also revealed mild cerebral atrophy and small vessel ischemic changes. Brain MRI on 05/01/2020 was stable.   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 Echo 2012: EF 45-50%;  b. Lex MV 2/12:  low risk, apical defect (small area of ischemia vs shifting breast atten);  c.  Echo 7/12: Normal wall thickness, EF 60-65%, normal wall motion, grade 1 diastolic dysfunction, mild LAE, PASP 32;   d. Lex MV 11/13:  EF 76%, no ischemia   OA (osteoarthritis) 09/07/2011   Obesity 09/28/2014   Osteoporosis 03/07/2011   DEXA T score -2.6 AP spine 03/07/11    Formatting of this note might be different from the original. Formatting of this note might be different from the original. DEXA T score -2.6 AP spine 03/07/11   Last Assessment & Plan:  Formatting of this note might be different from the original. Encouraged to get adequate exercise, calcium and vitamin d intake   Pain in joint, ankle and foot 09/28/2014   Palpitation 10/28/2020   Personal history of chemotherapy 2012   Personal history of radiation therapy 2012   Prediabetes 06/15/2009   Recurrent falls 12/28/2015   Right knee pain 07/10/2020   Shortness of breath    UTI (urinary tract infection) 03/03/2012   Vertebral fracture, osteoporotic, sequela 04/05/2016   Vitamin D deficiency 06/14/2017   Supplement  and monitor    Past Surgical History:  Procedure Laterality Date   BREAST LUMPECTOMY  04/2009   RIGHT FOR BREAST CANCER-CHEMO/RADIATION X 1 YEAR   BREAST REDUCTION SURGERY Bilateral 05/04/2014   Procedure: MAMMARY REDUCTION  (BREAST);  Surgeon: Cristine Polio, MD;  Location: [facility];   Service: Plastics;  Laterality: Bilateral;   CARPAL TUNNEL RELEASE     L wrist, ulnar nerve moved   CHOLECYSTECTOMY     CRANIOTOMY FOR TUMOR  2000   ELBOW SURGERY     left   KNEE ARTHROSCOPY Left 10/12/2014   Procedure: LEFT KNEE ARTHROSCOPY ;  Surgeon: Gaynelle Arabian, MD;  Location: [facility];  Service: Orthopedics;  Laterality: Left;   KYPHOPLASTY N/A 03/30/2016   Procedure: THORACIC 12 KYPHOPLASTY;  Surgeon: Phylliss Bob, MD;  Location: [facility];  Service: Orthopedics;  Laterality: N/A;  THORACIC 12 KYPHOPLASTY   LEFT HEART CATHETERIZATION WITH CORONARY ANGIOGRAM N/A 12/16/2013   Procedure: LEFT HEART CATHETERIZATION WITH CORONARY ANGIOGRAM;  Surgeon: Peter M [name], MD;  Location: [facility] CATH LAB;  Service: Cardiovascular;  Laterality: N/A;   LIPOMA EXCISION  03/28/2009   right leg   MELANOMA EXCISION     PORT-A-CATH REMOVAL  11/30/2010   [place]   porta cath     PORTACATH PLACEMENT  may 2011   REDUCTION MAMMAPLASTY Bilateral    SYNOVECTOMY Left 10/12/2014   Procedure: WITH SYNOVECTOMY;  Surgeon: Gaynelle Arabian, MD;  Location: [facility];  Service: Orthopedics;  Laterality: Left;   TONSILLECTOMY  1958   TOTAL KNEE ARTHROPLASTY  02/05/2012   Procedure: TOTAL KNEE ARTHROPLASTY;  Surgeon: Gearlean Alf, MD;  Location: [facility];  Service: Orthopedics;  Laterality: Right;   TOTAL KNEE ARTHROPLASTY Left 02/10/2013   Procedure: LEFT TOTAL KNEE ARTHROPLASTY;  Surgeon: Gearlean Alf, MD;  Location: [facility];  Service: Orthopedics;  Laterality: Left;   total knee raplacement  01-2012   Right Knee   TOTAL KNEE REVISION Left 07/14/2020   Procedure: TOTAL KNEE REVISION;  Surgeon: Gaynelle Arabian, MD;  Location: [facility];  Service: Orthopedics;  Laterality: Left;   TUBAL LIGATION  1997    Current Outpatient Medications:    acetaminophen (TYLENOL) 500 MG tablet, Take 1-2 tablets (500-1,000 mg total) by mouth every 8 (eight) hours as needed for moderate pain., Disp: 100 tablet, Rfl: 2   albuterol (VENTOLIN HFA) 108 (90  Base) MCG/ACT inhaler, Inhale 1-2 puffs into the lungs every 6 (six) hours as needed for wheezing or shortness of breath., Disp: 18 g, Rfl: 3   AMBULATORY NON FORMULARY MEDICATION, Nitroglycerin 0.125% gel applied topically to the rectum TID x 6-8 weeks, Disp: 1 each, Rfl: 1   carvedilol (COREG) 25 MG tablet, TAKE 1 TABLET TWICE DAILY WITH A MEAL (Patient taking differently: Take 25 mg by mouth 2 (two) times daily with a meal.), Disp: 180 tablet, Rfl: 1   cetirizine (ZYRTEC) 10 MG tablet, Take 10 mg by mouth daily., Disp: , Rfl:    COVID-19 mRNA bivalent vaccine, Pfizer, injection, Inject into the muscle., Disp: 0.3 mL, Rfl: 0   diltiazem (CARDIZEM CD) 120 MG 24 hr capsule, Take 1 capsule (120 mg total) by mouth daily., Disp: 30 capsule, Rfl: 3   diphenhydrAMINE (BENADRYL) 25 mg capsule, Take 25 mg by mouth at bedtime. As needed, Disp: , Rfl:    donepezil (ARICEPT) 10 MG tablet, TAKE 1 TABLET (10 MG TOTAL) BY MOUTH AT BEDTIME., Disp: 90 tablet, Rfl: 0   ergocalciferol (VITAMIN D2) 1.25  MG (50000 UT) capsule, Take 50,000 Units by mouth once a week., Disp: , Rfl:    famotidine (PEPCID) 20 MG tablet, TAKE 1 TABLET BY MOUTH TWICE A DAY (Patient taking differently: Take 20 mg by mouth 2 (two) times daily.), Disp: 180 tablet, Rfl: 1   ferrous sulfate 325 (65 FE) MG tablet, Take 325 mg by mouth daily with breakfast., Disp: , Rfl:    folic acid (FOLVITE) 1 MG tablet, Take 1 tablet (1 mg total) by mouth daily. (Patient taking differently: Take 1 mg by mouth at bedtime.), Disp: 90 tablet, Rfl: 0   gabapentin (NEURONTIN) 300 MG capsule, Take 1 capsule (300 mg total) by mouth 2 (two) times daily as needed. (Patient taking differently: Take 300 mg by mouth 2 (two) times daily.), Disp: 180 capsule, Rfl: 0   hydrOXYzine (ATARAX/VISTARIL) 25 MG tablet, Take 25 mg by mouth at bedtime., Disp: , Rfl:    methocarbamol (ROBAXIN) 500 MG tablet, Take 1 tablet (500 mg total) by mouth every 6 (six) hours as needed for muscle  spasms., Disp: 40 tablet, Rfl: 0   Multiple Vitamin (MULTIVITAMIN WITH MINERALS) TABS tablet, Take 1 tablet by mouth daily., Disp: , Rfl:    pentosan polysulfate (ELMIRON) 100 MG capsule, Take 100 mg by mouth 2 (two) times daily. , Disp: , Rfl:    rosuvastatin (CRESTOR) 20 MG tablet, TAKE 1 TABLET AT BEDTIME, Disp: 90 tablet, Rfl: 3   venlafaxine XR (EFFEXOR-XR) 75 MG 24 hr capsule, Take 3 capsules (225 mg total) by mouth daily with breakfast., Disp: 270 capsule, Rfl: 1  Clinical Interview:   The following information was obtained during a clinical interview with Ms. Krizan prior to cognitive testing.  Cognitive Symptoms: Decreased short-term memory: Endorsed. Ms. Feeley generally described trouble retrieving information from her memory on demand, especially names. She noted that information will generally come to her with time. She largely denied any other notable concerns. Memory dysfunction was said to be present for the past 6-12 months. However, this timeline contradicts medical records, which state that memory concerns have been present for several years, at least dating back to 2019. These records also note that she frequently repeats herself in conversation and has trouble recalling her medications.  Decreased long-term memory: Denied. Decreased attention/concentration: Denied. Reduced processing speed: Endorsed. Difficulties with executive functions: Endorsed. She acknowledged mild difficulties with organization and multi-tasking. She generally denied trouble with indecision or impulsivity. No significant personality changes were reported.  Difficulties with emotion regulation: Denied. Difficulties with receptive language: Denied. Difficulties with word finding: Endorsed. Decreased visuoperceptual ability: Denied.  Difficulties completing ADLs: Somewhat. She acknowledged that her daughter provides some assistance with medication management as she is a Marine scientist. Records suggest that her  daughter fully took over medication management as Ms. Rallo was commonly forgetting to take her medications. This was first observed in her medical records this past March. She denied trouble with financial responsibilities, bill paying, or driving.   Additional Medical History: History of traumatic brain injury/concussion: Denied. History of stroke: Denied. History of seizure activity: Denied. History of known exposure to toxins: Denied. Symptoms of chronic pain: Denied. Experience of frequent headaches/migraines: Denied. She did acknowledge a history of headache symptoms in the past, but generally denied their presence currently. Records from earlier this year suggest a history of severe headaches which had started diminishing in severity and frequent this past March.  Frequent instances of dizziness/vertigo: Denied.  Sensory changes: Denied.  Balance/coordination difficulties: Denied. She acknowledged mild balance instability  from time to time. She denied any recent falls. Medical records do suggest a history of frequent falling in the past. The cause for this was unknown.  Other motor difficulties: Denied.  Other medical conditions: Ms. Manzella was diagnosed with a craniopharyngioma and underwent a right frontotemporal craniotomy in May 2000. She was also diagnosed with right-sided breast cancer in 2011 and underwent lumpectomy, chemotherapy, and radiation treatment. She did not report persisting cognitive dysfunction following the completion of this treatment.   Sleep History: Estimated hours obtained each night: 8 hours.  Difficulties falling asleep: Denied. Difficulties staying asleep: Denied. Feels rested and refreshed upon awakening: Endorsed.  History of snoring: Denied. History of waking up gasping for air: Denied. Witnessed breath cessation while asleep: Denied.  History of vivid dreaming: Endorsed. Excessive movement while asleep: Denied. Instances of acting out her dreams:  Denied.  Psychiatric/Behavioral Health History: Depression: Currently, she described herself as a "little depressed." This was attributed to her son passing away two years prior and her "not knowing what to do" with her life at the current time. She denied a prior history of depression or other mental health concerns. Medical records do suggest prior concerns surrounding depression prior to the passing of her son. Current or remote suicidal ideation, intent, or plan was denied.  Anxiety: Denied. Mania: Denied. Trauma History: Denied. Visual/auditory hallucinations: Denied. However, medical records do mention two possible instances where she was actively hallucinating. There is mention of a prior instance where she was found playing hide and seek with her granddaughter who was not actually present. There was also report of her believing that her mother who had long passed was in the kitchen cooking food. The latter may have been present around the time of a possible UTI.  Delusional thoughts: Denied.  Tobacco: Denied. Alcohol: She denied current alcohol consumption as well as a history of problematic alcohol abuse or dependence.  Recreational drugs: Denied.  Family History: Problem Relation Age of Onset   Breast cancer Mother        sarcoma   Lung cancer Mother    Hypertension Mother    Prostate cancer Father    Congestive Heart Failure Father    Heart attack Father    Prostate cancer Brother    Heart disease Maternal Grandfather        MI   Down syndrome Son    CVA Son    Stomach cancer Maternal Aunt    Dementia Maternal Aunt    Uterine cancer Maternal Aunt    Colon cancer Neg Hx    This information was confirmed by Ms. Gwendlyn Deutscher.  Academic/Vocational History: Highest level of educational attainment: 12 years. 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 No relative weaknesses were identified.  History of developmental  delay: Denied. History of grade repetition: Denied. Enrollment in special education courses: Denied. History of LD/ADHD: Denied.  Employment: Retired. 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   Evaluation Results:   Behavioral Observations: Ms. Mccamish was unaccompanied, arrived to her appointment on time, and was appropriately dressed and groomed. She appeared alert and oriented. Observed gait and station were within normal limits. Gross motor functioning appeared intact upon informal observation and no abnormal movements (e.g., tremors) were noted. Her affect was anxious during the initial parts of the interview. Her speech was rapid, pressured, and somewhat tangential  at first. However, she did appear to relax as the interview progressed and these observations subsided. Spontaneous speech was fluent and word finding difficulties were not observed during the clinical interview. Thought processes were coherent, organized, and normal in content. Insight into her cognitive difficulties appeared limited in that cognitive dysfunction was often more severe than what was reported during interview. It was unclear if Ms. Ferrick was actively diminishing concerns or if she was unaware of making several contradictory statements as to what is stated in her medical records.   During testing, Ms. Plaia often forget task instructions midway through her attempt. She was noted to have several set loss errors across multiple tasks, as well as made repetitive statements. Sustained attention was appropriate. Task engagement was adequate and she persisted when challenged. Overall, Ms. Lanum was cooperative with the clinical interview and subsequent testing procedures.   Adequacy of Effort: The validity of neuropsychological testing is limited by the extent to which the individual being tested may be assumed to have exerted adequate  effort during testing. Ms. Jaquez expressed her intention to perform to the best of her abilities and exhibited adequate task engagement and persistence. Scores across stand-alone and embedded performance validity measures were within expectation. As such, the results of the current evaluation are believed to be a valid representation of Ms. Samons' current cognitive functioning.  Test Results: Ms. Boening was largely oriented at the time of the current evaluation. She was one day off what stating the current date.   Intellectual abilities based upon educational and vocational attainment were estimated to be in the average range. Premorbid abilities were estimated to be within the average range based upon a single-word reading test.   Processing speed was below average to average. Basic attention was average. More complex attention (e.g., working memory) was also average. Executive functioning was generally below average to average. She did perform in the exceptionally low range on a fluency-based task assessing cognitive flexibility. This performance was mildly affected by several set loss errors. She performed in the average range across a task assessing safety and judgment.  Assessed receptive language abilities were average. Ms. Lardner did not exhibit any difficulties comprehending task instructions (however, she did seem to forget them quickly) and answered all questions asked of her appropriately. Assessed expressive language was variable. Phonemic fluency was average to above average and semantic fluency was below average to average. Confrontation naming was exceptionally low.   Assessed visuospatial/visuoconstructional abilities were average. Points were lost on her drawing of a clock mild spatial errors in numerical arrangement, as well as incorrect hand placement.    Learning (i.e., encoding) of novel verbal information was well below average. Spontaneous delayed recall (i.e., retrieval) of  previously learned information was exceptionally low to well below average. Retention rates were 0% across a story learning task, 20% (raw score of 1) across a list learning task, and 0% across a figure drawing task. Performance across recognition tasks was exceptionally low to below average. However, she commented that she was simply guessing across all recognition tasks, suggesting limited evidence for information consolidation.   Results of emotional screening instruments suggested that recent symptoms of generalized anxiety were in the mild to moderate range, while symptoms of depression were within the mild range. A screening instrument assessing recent sleep quality suggested the presence of minimal sleep dysfunction.  Tables of Scores:   Note: This summary of test scores accompanies the interpretive report and should not be considered in isolation without reference to  the appropriate sections in the text. Descriptors are based on appropriate normative data and may be adjusted based on clinical judgment. Terms such as "Within Normal Limits" and "Outside Normal Limits" are used when a more specific description of the test score cannot be determined.       Percentile - Normative Descriptor > 98 - Exceptionally High 91-97 - Well Above Average 75-90 - Above Average 25-74 - Average 9-24 - Below Average 2-8 - Well Below Average < 2 - Exceptionally Low       Validity:   DESCRIPTOR       Dot Counting Test: --- --- Within Normal Limits  RBANS Effort Index: --- --- Within Normal Limits  WAIS-IV Reliable Digit Span: --- --- Within Normal Limits  D-KEFS Color Word Effort Index: --- --- Within Normal Limits       Orientation:      Raw Score Percentile   NAB Orientation, Form 1 28/29 --- ---       Cognitive Screening:      Raw Score Percentile   SLUMS: 21/30 --- ---       RBANS, Form A: Standard Score/ Scaled Score Percentile   Total Score 69 2 Well Below Average  Immediate Memory 65 1  Exceptionally Low    List Learning 4 2 Well Below Average    Story Memory 4 2 Well Below Average  Visuospatial/Constructional 105 63 Average    Figure Copy 10 50 Average    Line Orientation 18/20 51-75 Average  Language 68 2 Well Below Average    Picture Naming 6/10 <2 Exceptionally Low    Semantic Fluency 7 16 Below Average  Attention 94 34 Average    Digit Span 11 63 Average    Coding 7 16 Below Average  Delayed Memory 48 <1 Exceptionally Low    List Recall 1/10 3-9 Well Below Average    List Recognition 15/20 <2 Exceptionally Low    Story Recall 1 <1 Exceptionally Low    Story Recognition 8/12 8-15 Below Average    Figure Recall 1 <1 Exceptionally Low    Figure Recognition 4/8 9-20 Below Average       Intellectual Functioning:      Standard Score Percentile   Test of Premorbid Functioning: 99 47 Average       Attention/Executive Function:     Trail Making Test (TMT): Raw Score (T Score) Percentile     Part A 31 secs.,  0 errors (56) 73 Average    Part B 208 secs.,  2 errors (39) 14 Below Average         Scaled Score Percentile   WAIS-IV Digit Span: 10 50 Average    Forward 10 50 Average    Backward 10 50 Average    Sequencing 9 37 Average        Scaled Score Percentile   WAIS-IV Similarities: 8 25 Average       D-KEFS Color-Word Interference Test: Raw Score (Scaled Score) Percentile     Color Naming 36 secs. (9) 37 Average    Word Reading 22 secs. (12) 75 Above Average    Inhibition 73 secs. (10) 50 Average      Total Errors 3 errors (10) 50 Average    Inhibition/Switching 83 secs. (9) 37 Average      Total Errors 3 errors (10) 50 Average       D-KEFS Verbal Fluency Test: Raw Score (Scaled Score) Percentile     Letter Total Correct 44 (13)  84 Above Average    Category Total Correct 25 (7) 16 Below Average    Category Switching Total Correct 5 (2) <1 Exceptionally Low    Category Switching Accuracy 2 (1) <1 Exceptionally Low      Total Set Loss Errors 8 (3) 1  Exceptionally Low      Total Repetition Errors 14 (1) <1 Exceptionally Low       NAB Executive Functions Module, Form 1: T Score Percentile     Judgment 55 69 Average       Language:     Verbal Fluency Test: Raw Score (T Score) Percentile     Phonemic Fluency (FAS) 44 (55) 69 Average    Animal Fluency 15 (47) 38 Average        NAB Language Module, Form 1: T Score Percentile     Auditory Comprehension 47 38 Average    Naming 17/31 (19) <1 Exceptionally Low       Visuospatial/Visuoconstruction:      Raw Score Percentile   Clock Drawing: 7/10 --- Within Normal Limits        Scaled Score Percentile   WAIS-IV Block Design: 9 37 Average       Mood and Personality:      Raw Score Percentile   Geriatric Depression Scale: 16 --- Mild  Geriatric Anxiety Scale: 22 --- Moderate    Somatic 8 --- Mild    Cognitive 8 --- Moderate    Affective 6 --- Mild       Additional Questionnaires:      Raw Score Percentile   PROMIS Sleep Disturbance Questionnaire: 23 --- None to Slight   Informed Consent and Coding/Compliance:   The current evaluation represents a clinical evaluation for the purposes previously outlined by the referral source and is in no way reflective of a forensic evaluation.   Ms. Meenan was provided with a verbal description of the nature and purpose of the present neuropsychological evaluation. Also reviewed were the foreseeable risks and/or discomforts and benefits of the procedure, limits of confidentiality, and mandatory reporting requirements of this provider. The patient was given the opportunity to ask questions and receive answers about the evaluation. Oral consent to participate was provided by the patient.   This evaluation was conducted by Christia Reading, Ph.D., licensed clinical neuropsychologist. Ms. Holdsworth completed a clinical interview with Dr. Melvyn Novas, billed as one unit [phone], and 140 minutes of cognitive testing and scoring, billed as one unit [phone] and four  additional units [id number]. Psychometrist Cruzita Lederer, B.S., assisted Dr. Melvyn Novas with test administration and scoring procedures. As a separate and discrete service, Dr. Melvyn Novas spent a total of 160 minutes in interpretation and report writing billed as one unit [phone] and two units 96133.

## 2020-12-01 NOTE — Progress Notes (Signed)
   Psychometrician Note   Cognitive testing was administered to Shannon [name] by Cruzita Lederer, B.S. (psychometrist) under the supervision of Dr. Christia Reading, Ph.D., licensed psychologist on 12/01/2020. Shannon [name] did not appear overtly distressed by the testing session per behavioral observation or responses across self-report questionnaires. Rest breaks were offered.    The battery of tests administered was selected by Dr. Christia Reading, Ph.D. with consideration to Shannon [name]'s current level of functioning, the nature of her symptoms, emotional and behavioral responses during interview, level of literacy, observed level of motivation/effort, and the nature of the referral question. This battery was communicated to the psychometrist. Communication between Dr. Christia Reading, Ph.D. and the psychometrist was ongoing throughout the evaluation and Dr. Christia Reading, Ph.D. was immediately accessible at all times. Dr. Christia Reading, Ph.D. provided supervision to the psychometrist on the date of this service to the extent necessary to assure the quality of all services provided.    Shannon [name] will return within approximately 1-2 weeks for an interactive feedback session with Dr. Melvyn Novas at which time her test performances, clinical impressions, and treatment recommendations will be reviewed in detail. Shannon [name] understands she can contact our office should she require our assistance before this time.  A total of 140 minutes of billable time were spent face-to-face with Shannon [name] by the psychometrist. This includes both test administration and scoring time. Billing for these services is reflected in the clinical report generated by Dr. Christia Reading, Ph.D.  This note reflects time spent with the psychometrician and does not include test scores or any clinical interpretations made by Dr. Melvyn Novas. The full report will follow in a separate note.

## 2020-12-02 ENCOUNTER — Encounter: Payer: Self-pay | Admitting: Family Medicine

## 2020-12-02 ENCOUNTER — Ambulatory Visit (INDEPENDENT_AMBULATORY_CARE_PROVIDER_SITE_OTHER): Payer: Medicare Other | Admitting: Family Medicine

## 2020-12-02 VITALS — BP 122/76 | HR 68 | Temp 98.1°F | Resp 16 | Wt 171.6 lb

## 2020-12-02 DIAGNOSIS — N3010 Interstitial cystitis (chronic) without hematuria: Secondary | ICD-10-CM

## 2020-12-02 DIAGNOSIS — G309 Alzheimer's disease, unspecified: Secondary | ICD-10-CM | POA: Diagnosis not present

## 2020-12-02 DIAGNOSIS — I10 Essential (primary) hypertension: Secondary | ICD-10-CM | POA: Diagnosis not present

## 2020-12-02 DIAGNOSIS — F339 Major depressive disorder, recurrent, unspecified: Secondary | ICD-10-CM

## 2020-12-02 DIAGNOSIS — Z23 Encounter for immunization: Secondary | ICD-10-CM | POA: Diagnosis not present

## 2020-12-02 DIAGNOSIS — F0670 Mild neurocognitive disorder due to known physiological condition without behavioral disturbance: Secondary | ICD-10-CM | POA: Diagnosis not present

## 2020-12-02 MED ORDER — HYDROXYZINE HCL 25 MG PO TABS: 12.5000 mg | ORAL_TABLET | Freq: Two times a day (BID) | ORAL | 1 refills | Status: DC | PRN

## 2020-12-02 NOTE — Addendum Note (Signed)
Addended by: Kelle Darting A on: 12/02/2020 04:28 PM   Modules accepted: Orders

## 2020-12-02 NOTE — Patient Instructions (Addendum)
Call [facility] to schedule appointment with Karna Christmas  She has had her first Shingrix 05/09/2016, but still needs her second dose  D-Mannose

## 2020-12-02 NOTE — Progress Notes (Signed)
Patient ID: Shannon [name], female    DOB: 09/29/1945  Age: 75 y.o. MRN: [id number]    Subjective:   Chief Complaint  Patient presents with   Follow-up   Asthma   Subjective   HPI Shannon [name] presents for office visit today for follow up on Asthma and memory concerns. She expresses concern in regard to her worsening memory and her recent cognitive evaluation at [facility]. She is meeting with Dr. Tomi Likens next week to discuss the results of the evaluation. She confirms that her memory is still in decline. Denies CP/palp/SOB/HA/congestion/fevers/GI or GU c/o. Taking meds as prescribed.   Review of Systems  Constitutional:  Negative for chills, fatigue and fever.  HENT:  Negative for congestion, rhinorrhea, sinus pressure, sinus pain and sore throat.   Eyes:  Negative for pain.  Respiratory:  Negative for cough and shortness of breath.   Cardiovascular:  Negative for chest pain, palpitations and leg swelling.  Gastrointestinal:  Negative for abdominal pain, blood in stool, diarrhea, nausea and vomiting.  Genitourinary:  Negative for decreased urine volume, flank pain, frequency, vaginal bleeding and vaginal discharge.  Musculoskeletal:  Negative for back pain.  Neurological:  Negative for headaches.  Psychiatric/Behavioral:  The patient is nervous/anxious.    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 Echo 2012: EF 45-50%;  b. Lex MV 2/12:  low risk, apical defect (small area of ischemia vs shifting breast atten);  c.  Echo 7/12: Normal wall thickness, EF 60-65%, normal wall motion, grade 1 diastolic dysfunction, mild LAE, PASP 32;   d. Lex MV 11/13:  EF 76%, no ischemia   OA (osteoarthritis) 09/07/2011   Obesity 09/28/2014   Osteoporosis 03/07/2011   DEXA T score -2.6 AP spine 03/07/11    Formatting of this note might be different from the original. Formatting of this note might be different from the original. DEXA T score -2.6 AP spine 03/07/11   Last Assessment & Plan:  Formatting of this note might be different from the original. Encouraged to get adequate exercise, calcium and  vitamin d intake   Pain in joint, ankle and foot 09/28/2014   Palpitation 10/28/2020   Personal history of chemotherapy 2012   Personal history of radiation therapy 2012   Prediabetes 06/15/2009   Recurrent falls 12/28/2015   Right knee pain 07/10/2020   Shortness of breath    UTI (urinary tract infection) 03/03/2012   Vertebral fracture, osteoporotic, sequela 04/05/2016   Vitamin D deficiency 06/14/2017   Supplement and monitor    She has a past surgical history that includes  Cholecystectomy; Carpal tunnel release; Elbow surgery; Tubal ligation (1997); Lipoma excision (03/28/2009); Portacath placement (may 2011); Craniotomy for tumor (2000); Tonsillectomy (1958); Melanoma excision; porta cath; Port-a-cath removal (11/30/2010); total knee raplacement (01-2012); Total knee arthroplasty (02/05/2012); Total knee arthroplasty (Left, 02/10/2013); left heart catheterization with coronary angiogram (N/A, 12/16/2013); Breast reduction surgery (Bilateral, 05/04/2014); Knee arthroscopy (Left, 10/12/2014); Synovectomy (Left, 10/12/2014); Kyphoplasty (N/A, 03/30/2016); Reduction mammaplasty (Bilateral); Breast lumpectomy (04/2009); and Total knee revision (Left, 07/14/2020).   Her family history includes Breast cancer in her mother; CVA in her son; Congestive Heart Failure in her father; Dementia in her maternal aunt; Down syndrome in her son; Heart attack in her father; Heart disease in her maternal grandfather; Hypertension in her mother; Lung cancer in her mother; Prostate cancer in her brother and father; Stomach cancer in her maternal aunt; Uterine cancer in her maternal aunt.She reports that she has never smoked. She has never used smokeless tobacco. She reports that she does not drink alcohol and does not use drugs.  Current Outpatient Medications on File Prior to Visit  Medication Sig Dispense Refill   acetaminophen (TYLENOL) 500 MG tablet Take 1-2 tablets (500-1,000 mg total) by mouth every 8 (eight) hours as needed for moderate pain. 100 tablet 2   albuterol (VENTOLIN HFA) 108 (90 Base) MCG/ACT inhaler Inhale 1-2 puffs into the lungs every 6 (six) hours as needed for wheezing or shortness of breath. 18 g 3   AMBULATORY NON FORMULARY MEDICATION Nitroglycerin 0.125% gel applied topically to the rectum TID x 6-8 weeks 1 each 1   carvedilol (COREG) 25 MG tablet TAKE 1 TABLET TWICE DAILY WITH A MEAL (Patient taking differently: Take 25 mg by mouth 2 (two) times daily with a meal.) 180 tablet 1    cetirizine (ZYRTEC) 10 MG tablet Take 10 mg by mouth daily.     COVID-19 mRNA bivalent vaccine, Pfizer, injection Inject into the muscle. 0.3 mL 0   diltiazem (CARDIZEM CD) 120 MG 24 hr capsule Take 1 capsule (120 mg total) by mouth daily. 30 capsule 3   donepezil (ARICEPT) 10 MG tablet TAKE 1 TABLET (10 MG TOTAL) BY MOUTH AT BEDTIME. 90 tablet 0   ergocalciferol (VITAMIN D2) 1.25 MG (50000 UT) capsule Take 50,000 Units by mouth once a week.     famotidine (PEPCID) 20 MG tablet TAKE 1 TABLET BY MOUTH TWICE A DAY (Patient taking differently: Take 20 mg by mouth 2 (two) times daily.) 180 tablet 1   ferrous sulfate 325 (65 FE) MG tablet Take 325 mg by mouth daily with breakfast.     folic acid (FOLVITE) 1 MG tablet Take 1 tablet (1 mg total) by mouth daily. (Patient taking differently: Take 1 mg by mouth at bedtime.) 90 tablet 0   gabapentin (NEURONTIN) 300 MG capsule Take 1 capsule (300 mg total) by mouth 2 (two) times daily as needed. (Patient taking differently: Take 300 mg by mouth 2 (two) times daily.) 180 capsule 0   methocarbamol (ROBAXIN) 500  MG tablet Take 1 tablet (500 mg total) by mouth every 6 (six) hours as needed for muscle spasms. 40 tablet 0   Multiple Vitamin (MULTIVITAMIN WITH MINERALS) TABS tablet Take 1 tablet by mouth daily.     pentosan polysulfate (ELMIRON) 100 MG capsule Take 100 mg by mouth 2 (two) times daily.      rosuvastatin (CRESTOR) 20 MG tablet TAKE 1 TABLET AT BEDTIME 90 tablet 3   venlafaxine XR (EFFEXOR-XR) 75 MG 24 hr capsule Take 3 capsules (225 mg total) by mouth daily with breakfast. 270 capsule 1   No current facility-administered medications on file prior to visit.     Objective:  Objective  Physical Exam Constitutional:      General: She is not in acute distress.    Appearance: Normal appearance. She is not ill-appearing or toxic-appearing.  HENT:     Head: Normocephalic and atraumatic.     Right Ear: Tympanic membrane, ear canal and external ear  normal.     Left Ear: Tympanic membrane, ear canal and external ear normal.     Nose: No congestion or rhinorrhea.  Eyes:     Extraocular Movements: Extraocular movements intact.     Pupils: Pupils are equal, round, and reactive to light.  Cardiovascular:     Rate and Rhythm: Normal rate and regular rhythm.     Pulses: Normal pulses.     Heart sounds: Normal heart sounds. No murmur heard. Pulmonary:     Effort: Pulmonary effort is normal. No respiratory distress.     Breath sounds: Normal breath sounds. No wheezing, rhonchi or rales.  Abdominal:     General: Bowel sounds are normal.     Palpations: Abdomen is soft. There is no mass.     Tenderness: There is no abdominal tenderness. There is no guarding.     Hernia: No hernia is present.  Musculoskeletal:        General: Normal range of motion.     Cervical back: Normal range of motion and neck supple.  Skin:    General: Skin is warm and dry.  Neurological:     Mental Status: She is alert and oriented to person, place, and time.  Psychiatric:        Behavior: Behavior normal.   BP 122/76   Pulse 68   Temp 98.1 F (36.7 C)   Resp 16   Wt 171 lb 9.6 oz (77.8 kg)   LMP 01/30/1994   SpO2 99%   BMI 33.51 kg/m  Wt Readings from Last 3 Encounters:  12/02/20 171 lb 9.6 oz (77.8 kg)  11/23/20 171 lb 6.4 oz (77.7 kg)  10/28/20 168 lb 9.6 oz (76.5 kg)     Lab Results  Component Value Date   WBC 7.4 10/28/2020   HGB 11.4 (L) 10/28/2020   HCT 35.8 (L) 10/28/2020   PLT 230.0 10/28/2020   GLUCOSE 94 10/28/2020   CHOL 152 10/28/2020   TRIG 207.0 (H) 10/28/2020   HDL 52.20 10/28/2020   LDLDIRECT 78.0 10/28/2020   LDLCALC 80 06/05/2018   ALT 19 10/28/2020   AST 21 10/28/2020   NA 144 10/28/2020   K 4.1 10/28/2020   CL 106 10/28/2020   CREATININE 0.82 10/28/2020   BUN 15 10/28/2020   CO2 31 10/28/2020   TSH 3.11 10/28/2020   INR 1.0 07/12/2020   HGBA1C 6.1 10/28/2020    MM 3D SCREEN BREAST BILATERAL  Result Date:  11/20/2020 CLINICAL DATA:  Screening. EXAM: DIGITAL SCREENING  BILATERAL MAMMOGRAM WITH TOMOSYNTHESIS AND CAD TECHNIQUE: Bilateral screening digital craniocaudal and mediolateral oblique mammograms were obtained. Bilateral screening digital breast tomosynthesis was performed. The images were evaluated with computer-aided detection. COMPARISON:  Previous exam(s). ACR Breast Density Category a: The breast tissue is almost entirely fatty. FINDINGS: There are no findings suspicious for malignancy. IMPRESSION: No mammographic evidence of malignancy. A result letter of this screening mammogram will be mailed directly to the patient. RECOMMENDATION: Screening mammogram in one year. (Code:SM-B-01Y) BI-RADS CATEGORY  1: Negative. Electronically Signed   By: Marin Olp M.D.   On: 11/20/2020 09:06     Assessment & Plan:  Plan    Meds ordered this encounter  Medications   hydrOXYzine (ATARAX/VISTARIL) 25 MG tablet    Sig: Take 0.5-1 tablets (12.5-25 mg total) by mouth 2 (two) times daily as needed.    Dispense:  60 tablet    Refill:  1    Problem List Items Addressed This Visit     Major depressive disorder with anxious distress    She is tearful and anxious about her recent memory concerns. She has just undergone her neuropsych testing and will return to see them next week continue current meds for now      Relevant Medications   hydrOXYzine (ATARAX/VISTARIL) 25 MG tablet   Essential hypertension    Well controlled, no changes to meds. Encouraged heart healthy diet such as the DASH diet and exercise as tolerated.       Interstitial cystitis    She is experiencing a flare. Is following with urology and she is counseled to try adding D Mannose to see if that helps      Mild neurocognitive disorder due to Alzheimer's disease    Her testing seems to confirm early memory loss and she does have a family history so she has been very worried about this. She will follow up with neurology for further  consideration.      Relevant Medications   hydrOXYzine (ATARAX/VISTARIL) 25 MG tablet   Other Visit Diagnoses     Need for influenza vaccination    -  Primary   Relevant Orders   Flu Vaccine QUAD High Dose(Fluad) (Completed)       Follow-up: Return in about 6 weeks (around 01/13/2021) for f/u vv on Wednesday.  I, Suezanne Jacquet, acting as a scribe for Penni Homans, MD, have documented all relevent documentation on behalf of Penni Homans, MD, as directed by Penni Homans, MD while in the presence of Penni Homans, MD. DO:12/03/20.  I, Mosie Lukes, MD personally performed the services described in this documentation. All medical record entries made by the scribe were at my direction and in my presence. I have reviewed the chart and agree that the record reflects my personal performance and is accurate and complete

## 2020-12-03 ENCOUNTER — Telehealth: Payer: Self-pay | Admitting: *Deleted

## 2020-12-03 ENCOUNTER — Other Ambulatory Visit: Payer: Self-pay

## 2020-12-03 ENCOUNTER — Other Ambulatory Visit (INDEPENDENT_AMBULATORY_CARE_PROVIDER_SITE_OTHER): Payer: Medicare Other

## 2020-12-03 DIAGNOSIS — D649 Anemia, unspecified: Secondary | ICD-10-CM | POA: Diagnosis not present

## 2020-12-03 DIAGNOSIS — K921 Melena: Secondary | ICD-10-CM

## 2020-12-03 LAB — FECAL OCCULT BLOOD, IMMUNOCHEMICAL: Fecal Occult Bld: POSITIVE — AB

## 2020-12-03 MED ORDER — OMEPRAZOLE 20 MG PO CPDR: 20.0000 mg | DELAYED_RELEASE_CAPSULE | Freq: Every day | ORAL | 3 refills | Status: DC

## 2020-12-03 NOTE — Chronic Care Management (AMB) (Signed)
  Chronic Care Management   Outreach Note  12/03/2020 Name: Shannon [name] MRN: [id number] DOB: August 13, 1945  Shannon [name] is a 75 y.o. year old female who is a primary care patient of Mosie Lukes, MD. I reached out to Shannon [name] by phone today in response to a referral sent by Shannon [name]'s primary care provider.  An unsuccessful telephone outreach was attempted today. The patient was referred to the case management team for assistance with care management and care coordination.   Follow Up Plan: A HIPAA compliant phone message was left for the patient providing contact information and requesting a return call.  If patient returns call to provider office, please advise to call Embedded Care Management Care Guide Dashauna Heymann at [place] [place]  Direct Dial: [phone]

## 2020-12-03 NOTE — Assessment & Plan Note (Signed)
Well controlled, no changes to meds. Encouraged heart healthy diet such as the DASH diet and exercise as tolerated.  °

## 2020-12-03 NOTE — Telephone Encounter (Signed)
Lvm to call back to go over IFOB results

## 2020-12-03 NOTE — Progress Notes (Signed)
Opened in error

## 2020-12-03 NOTE — Assessment & Plan Note (Signed)
She is tearful and anxious about her recent memory concerns. She has just undergone her neuropsych testing and will return to see them next week continue current meds for now

## 2020-12-03 NOTE — Telephone Encounter (Signed)
Please place referral to gastroenterology. Ty.

## 2020-12-03 NOTE — Assessment & Plan Note (Signed)
She is experiencing a flare. Is following with urology and she is counseled to try adding D Mannose to see if that helps

## 2020-12-03 NOTE — Telephone Encounter (Signed)
Routed to PCP and DOD in PCP's absence.  Received call from [place] at [facility] reporting positive IFOB.

## 2020-12-03 NOTE — Assessment & Plan Note (Signed)
Her testing seems to confirm early memory loss and she does have a family history so she has been very worried about this. She will follow up with neurology for further consideration.

## 2020-12-03 NOTE — Chronic Care Management (AMB) (Signed)
  Chronic Care Management   Note  12/03/2020 Name: Shannon [name] MRN: [id number] DOB: 1945/04/13  Shannon [name] is a 75 y.o. year old female who is a primary care patient of Mosie Lukes, MD. I reached out to Jeral Pinch by phone today in response to a referral sent by Shannon [name]'s PCP.  Shannon [name] was given information about Chronic Care Management services today including:  CCM service includes personalized support from designated clinical staff supervised by her physician, including individualized plan of care and coordination with other care providers 24/7 contact phone numbers for assistance for urgent and routine care needs. Service will only be billed when office clinical staff spend 20 minutes or more in a month to coordinate care. Only one practitioner may furnish and bill the service in a calendar month. The patient may stop CCM services at any time (effective at the end of the month) by phone call to the office staff. The patient is responsible for co-pay (up to 20% after annual deductible is met) if co-pay is required by the individual health plan.   Patient agreed to services and verbal consent obtained.   Follow up plan: Telephone appointment with care management team member scheduled for: 12/13/2020  Julian Hy, [facility]  Direct Dial: [phone]

## 2020-12-03 NOTE — Telephone Encounter (Signed)
placed

## 2020-12-06 DIAGNOSIS — R399 Unspecified symptoms and signs involving the genitourinary system: Secondary | ICD-10-CM | POA: Diagnosis not present

## 2020-12-06 NOTE — Telephone Encounter (Signed)
done

## 2020-12-07 ENCOUNTER — Other Ambulatory Visit: Payer: Self-pay

## 2020-12-07 DIAGNOSIS — D649 Anemia, unspecified: Secondary | ICD-10-CM

## 2020-12-07 DIAGNOSIS — K921 Melena: Secondary | ICD-10-CM

## 2020-12-08 DIAGNOSIS — N3010 Interstitial cystitis (chronic) without hematuria: Secondary | ICD-10-CM | POA: Diagnosis not present

## 2020-12-09 ENCOUNTER — Other Ambulatory Visit: Payer: Self-pay

## 2020-12-09 ENCOUNTER — Ambulatory Visit (INDEPENDENT_AMBULATORY_CARE_PROVIDER_SITE_OTHER): Payer: Medicare Other | Admitting: Psychology

## 2020-12-09 DIAGNOSIS — G309 Alzheimer's disease, unspecified: Secondary | ICD-10-CM | POA: Diagnosis not present

## 2020-12-09 DIAGNOSIS — F0670 Mild neurocognitive disorder due to known physiological condition without behavioral disturbance: Secondary | ICD-10-CM | POA: Diagnosis not present

## 2020-12-09 NOTE — Progress Notes (Signed)
   Neuropsychology Feedback Session Shannon [name]. [facility] Department of Neurology  Reason for Referral:   Shannon [name] is a 75 y.o. right-handed Caucasian female referred by Metta Clines, D.O., to characterize her current cognitive functioning and assist with diagnostic clarity and treatment planning in the context of subjective cognitive decline and concerns for a neurodegenerative process.   Feedback:   [name] completed a comprehensive neuropsychological evaluation on 12/01/2020. Please refer to that encounter for the full report and recommendations. Briefly, results suggested significant impairment surrounding confrontation naming, as well as all aspects of verbal and visual memory. Mild performance variability was further exhibited across cognitive flexibility. The most likely culprit for ongoing cognitive decline is unfortunately Alzheimer's disease. Across memory testing, [name] did not benefit from repeated exposure across learning trials and was essentially amnestic at a delay. Across yes/no recognition trials, she commented that she did not ever recall being presented a previous list of words, stories, or figure and stated that she was simply guessing. As such, below average scores are likely inflated by happenstance guessing. Overall, memory patterns suggest a severe memory storage deficit, which is the hallmark characteristic of this disease process. Severe impairment in confrontation naming is also very consistent with this disease process.   [name] was accompanied by her daughter in-person and her husband via speakerphone during the current feedback session. Content of the current session focused on the results of her neuropsychological evaluation. [name] was given the opportunity to ask questions and her questions were answered. She was encouraged to reach out should additional questions arise. A copy of her report was provided at the conclusion of the visit.       30 minutes were spent conducting the current feedback session with [name], billed as one unit [phone].

## 2020-12-11 ENCOUNTER — Other Ambulatory Visit: Payer: Self-pay | Admitting: Family Medicine

## 2020-12-13 ENCOUNTER — Ambulatory Visit (INDEPENDENT_AMBULATORY_CARE_PROVIDER_SITE_OTHER): Payer: Medicare Other

## 2020-12-13 ENCOUNTER — Telehealth: Payer: Self-pay | Admitting: Cardiology

## 2020-12-13 DIAGNOSIS — F339 Major depressive disorder, recurrent, unspecified: Secondary | ICD-10-CM

## 2020-12-13 DIAGNOSIS — F0670 Mild neurocognitive disorder due to known physiological condition without behavioral disturbance: Secondary | ICD-10-CM

## 2020-12-13 MED ORDER — TELMISARTAN 40 MG PO TABS: 40.0000 mg | ORAL_TABLET | Freq: Every day | ORAL | Status: DC

## 2020-12-13 NOTE — Chronic Care Management (AMB) (Signed)
Chronic Care Management   CCM RN Visit Note  12/13/2020 Name: Shannon [name] MRN: [id number] DOB: 12/09/1945  Subjective: Shannon [name] is a 75 y.o. year old female who is a primary care patient of Mosie Lukes, MD. The care management team was consulted for assistance with disease management and care coordination needs.    Engaged with patient by telephone for initial visit in response to provider referral for case management and/or care coordination services.   Consent to Services:  The patient was given the following information about Chronic Care Management services today, agreed to services, and gave verbal consent: 1. CCM service includes personalized support from designated clinical staff supervised by the primary care provider, including individualized plan of care and coordination with other care providers 2. 24/7 contact phone numbers for assistance for urgent and routine care needs. 3. Service will only be billed when office clinical staff spend 20 minutes or more in a month to coordinate care. 4. Only one practitioner may furnish and bill the service in a calendar month. 5.The patient may stop CCM services at any time (effective at the end of the month) by phone call to the office staff. 6. The patient will be responsible for cost sharing (co-pay) of up to 20% of the service fee (after annual deductible is met). Patient agreed to services and consent obtained.  Patient agreed to services and verbal consent obtained.   Assessment: Review of patient past medical history, allergies, medications, health status, including review of consultants reports, laboratory and other test data, was performed as part of comprehensive evaluation and provision of chronic care management services.   SDOH (Social Determinants of Health) assessments and interventions performed:  SDOH Interventions    Flowsheet Row Most Recent Value  SDOH Interventions   Food Insecurity Interventions Intervention Not  Indicated  Transportation Interventions Intervention Not Indicated  Depression Interventions/Treatment  Currently on Treatment        CCM Care Plan  Allergies  Allergen Reactions   Dilaudid [Hydromorphone Hcl] Itching    Outpatient Encounter Medications as of 12/13/2020  Medication Sig Note   acetaminophen (TYLENOL) 500 MG tablet Take 1-2 tablets (500-1,000 mg total) by mouth every 8 (eight) hours as needed for moderate pain. 09/21/2020: PRN   albuterol (VENTOLIN HFA) 108 (90 Base) MCG/ACT inhaler Inhale 1-2 puffs into the lungs every 6 (six) hours as needed for wheezing or shortness of breath. 09/21/2020: PRN   Calcium Carbonate (CALCIUM 600 PO) Take 1 tablet by mouth daily.    carvedilol (COREG) 25 MG tablet TAKE 1 TABLET TWICE DAILY WITH A MEAL (Patient taking differently: Take 25 mg by mouth 2 (two) times daily with a meal.)    cetirizine (ZYRTEC) 10 MG tablet Take 10 mg by mouth daily.    diltiazem (CARDIZEM CD) 120 MG 24 hr capsule Take 1 capsule (120 mg total) by mouth daily.    docusate sodium (COLACE) 250 MG capsule Take 250 mg by mouth daily as needed for constipation.    donepezil (ARICEPT) 10 MG tablet TAKE 1 TABLET (10 MG TOTAL) BY MOUTH AT BEDTIME.    ergocalciferol (VITAMIN D2) 1.25 MG (50000 UT) capsule Take 50,000 Units by mouth once a week.    ferrous sulfate 325 (65 FE) MG tablet Take 325 mg by mouth daily with breakfast.    folic acid (FOLVITE) 1 MG tablet Take 1 tablet (1 mg total) by mouth daily. (Patient taking differently: Take 1 mg by mouth at bedtime.)  gabapentin (NEURONTIN) 300 MG capsule Take 1 capsule (300 mg total) by mouth 2 (two) times daily as needed. (Patient taking differently: Take 300 mg by mouth 2 (two) times daily.)    hydrOXYzine (ATARAX/VISTARIL) 25 MG tablet Take 0.5-1 tablets (12.5-25 mg total) by mouth 2 (two) times daily as needed. 12/13/2020: Reports takes 1/2 -1 tablet twice a day every day   Melatonin 10 MG CAPS Take 1 capsule by mouth at  bedtime.    methocarbamol (ROBAXIN) 500 MG tablet Take 1 tablet (500 mg total) by mouth every 6 (six) hours as needed for muscle spasms.    Multiple Vitamin (MULTIVITAMIN WITH MINERALS) TABS tablet Take 1 tablet by mouth daily.    omeprazole (PRILOSEC) 20 MG capsule Take 1 capsule (20 mg total) by mouth daily.    pentosan polysulfate (ELMIRON) 100 MG capsule Take 100 mg by mouth 2 (two) times daily.     rosuvastatin (CRESTOR) 20 MG tablet TAKE 1 TABLET AT BEDTIME    venlafaxine XR (EFFEXOR-XR) 75 MG 24 hr capsule Take 3 capsules (225 mg total) by mouth daily with breakfast.    [DISCONTINUED] famotidine (PEPCID) 20 MG tablet TAKE 1 TABLET BY MOUTH TWICE A DAY (Patient taking differently: Take 20 mg by mouth 2 (two) times daily.)    AMBULATORY NON FORMULARY MEDICATION Nitroglycerin 0.125% gel applied topically to the rectum TID x 6-8 weeks    COVID-19 mRNA bivalent vaccine, Pfizer, injection Inject into the muscle.    No facility-administered encounter medications on file as of 12/13/2020.    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   Right knee pain 07/10/2020   Urinary frequency 02/11/2020   Debility 12/23/2017   Left knee pain 12/23/2017   Vitamin D deficiency 06/14/2017   Vertebral fracture, osteoporotic 04/05/2016   Left-sided back pain 03/06/2016   Chest pain 02/27/2016   Syncope 02/27/2016   Cataracts, bilateral 12/28/2015   Recurrent falls 12/28/2015   Family history- stomach cancer 09/08/2015   Family history of cancer 09/08/2015   [name] lesion 06/21/2015   Obesity 09/28/2014   Interstitial cystitis 09/28/2014   Pain in joint, ankle and foot 09/28/2014   Abnormal nuclear stress test 12/16/2013   Sternal pain 11/06/2013   Postoperative anemia due to acute blood loss 02/13/2013    Congestive dilated cardiomyopathy ([place]) 12/30/2012   Breast cancer of lower-inner quadrant of right female breast 10/21/2012   Constipation 03/03/2012   OA (osteoarthritis) 09/07/2011   Dermatitis    Hyponatremia 08/28/2011   Osteoporosis 03/07/2011   Prediabetes 06/15/2009   Lipoma 02/10/2009   Dyspnea on exertion 02/10/2009   Hyperlipidemia, mixed 08/11/2008   Major depressive disorder with anxious distress 08/11/2008   Essential hypertension 08/11/2008   GERD (gastroesophageal reflux disease) 08/11/2008   Craniopharyngioma 2000    Conditions to be addressed/monitored:Anxiety, Depression, and Dementia  Care Plan : RN Case Manager Plan of Care  Updates made by Luretha Rued, RN since 12/13/2020 12:00 AM     Problem: No Plan of Care Established for Managment of Chronic Disease (Neurocognitive Disorder, Anxiety/Depression)   Priority: High     Long-Range Goal: Development of Plan of Care for Chronic Disease Managment (Neurocognitive Disorder, Anxiety/Depression)   Start Date: 12/13/2020  Expected End Date: 03/15/2021  Priority: High  Note:   Current Barriers: Patient lives with  husband. She reports new diagnosis of dementia which is causing her anxiety and depression. She reports the loss of her son a couple years ago that is still affecting her. She expresses this is her main concern. Mrs. Devora is receptive to LCSW referral to assist with mental health needs.  Knowledge Deficits related to plan of care for management of neurocognitive disorder, anxiety/depression  Cognitive Deficits/Memory loss Medication management-Per patient, she has forgotten to take medications in the past, but reports it is better now. She reports her Daughter is assisting her in filling pill box and medication management.  RNCM Clinical Goal(s):  Patient will verbalize understanding of plan for management of Anxiety, Depression, and Dementia as evidenced by taking medications as prescribed, following  up with provider appointments as schedule, engaging with LCSW and/or referred specialist. take all medications exactly as prescribed and will call provider for medication related questions as evidenced by medication adherance    work with pharmacist to address medications assistance program if available for [place] and follow up on medication managment related to chronic disease as evidenced by review of EMR and patient or pharmacist report    work with Education officer, museum to address Mental Health Concerns , Cognitive Deficits, and Memory Deficits related to the management of Anxiety and Depression as evidenced by review of EMR and patient or social worker report     through collaboration with Consulting civil engineer, provider, and care team.   Interventions: 1:1 collaboration with primary care provider regarding development and update of comprehensive plan of care as evidenced by provider attestation and co-signature Inter-disciplinary care team collaboration (see longitudinal plan of care) Evaluation of current treatment plan related to  self management and patient's adherence to plan as established by provider  Dementia InterventionsNew goal. Evaluation of current treatment plan related to Dementia, Memory Deficits self-management and patient's adherence to plan as established by provider. Discussed plans with patient for ongoing care management follow up and provided patient with direct contact information for care management team Social Work referral for anxiety/depression related to new diagnosis of dementia; Provided education regarding memory loss  Anxiety/Depression InterventionsNew goal. Evaluation of current treatment plan related to Anxiety and Depression, Memory Deficits self-management and patient's adherence to plan as established by provider. Discussed plans with patient for ongoing care management follow up and provided patient with direct contact information for care management team Social  Work referral for anxiety/depression related to new diagnosis and working on grief/loss of her son.;   Patient Goals/Self-Care Activities: Patient will self administer medications as prescribed as evidenced by self report/primary caregiver report  Patient will attend all scheduled provider appointments as evidenced by clinician review of documented attendance to scheduled appointments and patient/caregiver report Patient will continue to perform ADL's independently as evidenced by patient/caregiver report Patient will call provider office for new concerns or questions as evidenced by review of documented incoming telephone call notes and patient report Eat Healthy: fruits, vegetables, lean meat, healthy fats. Avoid: saturated fats found in red meat and full-fat dairy products; fried foods, processed meats.  Increase exercise/activity per provider recommendations. Plan to receive phone call from licensed clinical social worker. Plan to received call from clinic pharmacist Review education provided on strategies for memory and healthy diet   Plan:Telephone follow up appointment with care management team member scheduled for:  next month The patient has been provided with contact information for the care management team and has been advised to call with any health related questions or concerns.  Thea Silversmith, RN, MSN, BSN, CCM Care Management Coordinator [facility] [phone]

## 2020-12-13 NOTE — Patient Instructions (Addendum)
Visit Information: Thank you for taking the time to talk with me today. See goal discussed below:  Patient Goals/Self-Care Activities: Patient will self administer medications as prescribed as evidenced by self report/primary caregiver report  Patient will attend all scheduled provider appointments as evidenced by clinician review of documented attendance to scheduled appointments and patient/caregiver report Patient will continue to perform ADL's independently as evidenced by patient/caregiver report Patient will call provider office for new concerns or questions as evidenced by review of documented incoming telephone call notes and patient report Eat Healthy: fruits, vegetables, lean meat, healthy fats. Avoid: saturated fats found in red meat and full-fat dairy products; fried foods, processed meats.  Increase exercise/activity per provider recommendations. Plan to receive phone call from licensed clinical social worker. Plan to received call from clinic pharmacist Review education provided on strategies for memory and healthy diet   Memory Compensation Strategies  Use "WARM" strategy.  W= write it down  A= associate it  R= repeat it  M= make a mental note  2.   You can keep a Social worker.  Use a 3-ring notebook with sections for the following: calendar, important names and phone numbers,  medications, doctors' names/phone numbers, lists/reminders, and a section to journal what you did  each day.   3.    Use a calendar to write appointments down.  4.    Write yourself a schedule for the day.  This can be placed on the calendar or in a separate section of the Memory Notebook.  Keeping a  regular schedule can help memory.  5.    Use medication organizer with sections for each day or morning/evening pills.  You may need help loading it  6.    Keep a basket, or pegboard by the door.  Place items that you need to take out with you in the basket or on the pegboard.  You may also  want to  include a message board for reminders.  7.    Use sticky notes.  Place sticky notes with reminders in a place where the task is performed.  For example: " turn off the  stove" placed by the stove, "lock the door" placed on the door at eye level, " take your medications" on  the bathroom mirror or by the place where you normally take your medications.  8.    Use alarms/timers.  Use while cooking to remind yourself to check on food or as a reminder to take your medicine, or as a  reminder to make a call, or as a reminder to perform another task, etc.   Heart-Healthy Eating Plan Heart-healthy meal planning includes: Eating less unhealthy fats. Eating more healthy fats. Making other changes in your diet. Talk with your doctor or a diet specialist (dietitian) to create an eating plan that is right for you.  What are tips for following this plan? Cooking Avoid frying your food. Try to bake, boil, grill, or broil it instead. You can also reduce fat by: Removing the skin from poultry. Removing all visible fats from meats. Steaming vegetables in water or broth. Meal planning  At meals, divide your plate into four equal parts: Fill one-half of your plate with vegetables and green salads. Fill one-fourth of your plate with whole grains. Fill one-fourth of your plate with lean protein foods. Eat 4-5 servings of vegetables per day. A serving of vegetables is: 1 cup of raw or cooked vegetables. 2 cups of raw leafy greens. Eat 4-5 servings  of fruit per day. A serving of fruit is: 1 medium whole fruit.  cup of dried fruit.  cup of fresh, frozen, or canned fruit.  cup of 100% fruit juice. Eat more foods that have soluble fiber. These are apples, broccoli, carrots, beans, peas, and barley. Try to get 20-30 g of fiber per day. Eat 4-5 servings of nuts, legumes, and seeds per week: 1 serving of dried beans or legumes equals  cup after being cooked. 1 serving of nuts is  cup. 1  serving of seeds equals 1 tablespoon. General information Eat more home-cooked food. Eat less restaurant, buffet, and fast food. Limit or avoid alcohol. Limit foods that are high in starch and sugar. Avoid fried foods. Lose weight if you are overweight. Keep track of how much salt (sodium) you eat. This is important if you have high blood pressure. Ask your doctor to tell you more about this. Try to add vegetarian meals each week. Fats Choose healthy fats. These include olive oil and canola oil, flaxseeds, walnuts, almonds, and seeds. Eat more omega-3 fats. These include salmon, mackerel, sardines, tuna, flaxseed oil, and ground flaxseeds. Try to eat fish at least 2 times each week. Check food labels. Avoid foods with trans fats or high amounts of saturated fat. Limit saturated fats. These are often found in animal products, such as meats, butter, and cream. These are also found in plant foods, such as palm oil, palm kernel oil, and coconut oil. Avoid foods with partially hydrogenated oils in them. These have trans fats. Examples are stick margarine, some tub margarines, cookies, crackers, and other baked goods. What foods can I eat? Fruits All fresh, canned (in natural juice), or frozen fruits. Vegetables Fresh or frozen vegetables (raw, steamed, roasted, or grilled). Green salads. Grains Most grains. Choose whole wheat and whole grains most of the time. Rice and pasta, including brown rice and pastas made with whole wheat. Meats and other proteins Lean, well-trimmed beef, veal, pork, and lamb. Chicken and [place] without skin. All fish and shellfish. Wild duck, rabbit, pheasant, and venison. Egg whites or low-cholesterol egg substitutes. Dried beans, peas, lentils, and tofu. Seeds and most nuts. Dairy Low-fat or nonfat cheeses, including ricotta and mozzarella. Skim or 1% milk that is liquid, powdered, or evaporated. Buttermilk that is made with low-fat milk. Nonfat or low-fat  yogurt. Fats and oils Non-hydrogenated (trans-free) margarines. Vegetable oils, including soybean, sesame, sunflower, olive, peanut, safflower, corn, canola, and cottonseed. Salad dressings or mayonnaise made with a vegetable oil. Beverages Mineral water. Coffee and tea. Diet carbonated beverages. Sweets and desserts Sherbet, gelatin, and fruit ice. Small amounts of dark chocolate. Limit all sweets and desserts. Seasonings and condiments All seasonings and condiments. The items listed above may not be a complete list of foods and drinks you can eat. Contact a dietitian for more options. What foods should I avoid? Fruits Canned fruit in heavy syrup. Fruit in cream or butter sauce. Fried fruit. Limit coconut. Vegetables Vegetables cooked in cheese, cream, or butter sauce. Fried vegetables. Grains Breads that are made with saturated or trans fats, oils, or whole milk. Croissants. Sweet rolls. Donuts. High-fat crackers, such as cheese crackers. Meats and other proteins Fatty meats, such as hot dogs, ribs, sausage, bacon, rib-eye roast or steak. High-fat deli meats, such as salami and bologna. Caviar. Domestic duck and goose. Organ meats, such as liver. Dairy Cream, sour cream, cream cheese, and creamed cottage cheese. Whole-milk cheeses. Whole or 2% milk that is liquid, evaporated, or condensed.  Whole buttermilk. Cream sauce or high-fat cheese sauce. Yogurt that is made from whole milk. Fats and oils Meat fat, or shortening. Cocoa butter, hydrogenated oils, palm oil, coconut oil, palm kernel oil. Solid fats and shortenings, including bacon fat, salt pork, lard, and butter. Nondairy cream substitutes. Salad dressings with cheese or sour cream. Beverages Regular sodas and juice drinks with added sugar. Sweets and desserts Frosting. Pudding. Cookies. Cakes. Pies. Milk chocolate or white chocolate. Buttered syrups. Full-fat ice cream or ice cream drinks. The items listed above may not be a  complete list of foods and drinks to avoid. Contact a dietitian for more information. Summary Heart-healthy meal planning includes eating less unhealthy fats, eating more healthy fats, and making other changes in your diet. Eat a balanced diet. This includes fruits and vegetables, low-fat or nonfat dairy, lean protein, nuts and legumes, whole grains, and heart-healthy oils and fats. This information is not intended to replace advice given to you by your health care provider. Make sure you discuss any questions you have with your health care provider. Document Revised: 05/27/2020 Document Reviewed: 05/27/2020 Elsevier Patient Education  2022 [place].    PATIENT GOALS/PLAN OF CARE:  Care Plan : RN Case Manager Plan of Care  Updates made by Luretha Rued, RN since 12/13/2020 12:00 AM     Problem: No Plan of Care Established for Managment of Chronic Disease (Neurocognitive Disorder, Anxiety/Depression)   Priority: High     Long-Range Goal: Development of Plan of Care for Chronic Disease Managment (Neurocognitive Disorder, Anxiety/Depression)   Start Date: 12/13/2020  Expected End Date: 03/15/2021  Priority: High  Note:   Current Barriers: Patient lives with husband. She reports new diagnosis of dementia which is causing her anxiety and depression. She reports the loss of her son a couple years ago that is still affecting her. She expresses this is her main concern. Mrs. Barnhardt is receptive to LCSW referral to assist with mental health needs.  Knowledge Deficits related to plan of care for management of neurocognitive disorder, anxiety/depression  Cognitive Deficits/Memory loss Medication management-Per patient, she has forgotten to take medications in the past, but reports it is better now. She reports her Daughter is assisting her in filling pill box and medication management.  RNCM Clinical Goal(s):  Patient will verbalize understanding of plan for management of Anxiety,  Depression, and Dementia as evidenced by taking medications as prescribed, following up with provider appointments as schedule, engaging with LCSW and/or referred specialist. take all medications exactly as prescribed and will call provider for medication related questions as evidenced by medication adherance    work with pharmacist to address medications assistance program if available for Elmiron and follow up on medication managment related to chronic disease as evidenced by review of EMR and patient or pharmacist report    work with Education officer, museum to address Mental Health Concerns , Cognitive Deficits, and Memory Deficits related to the management of Anxiety and Depression as evidenced by review of EMR and patient or social worker report     through collaboration with Consulting civil engineer, provider, and care team.   Interventions: 1:1 collaboration with primary care provider regarding development and update of comprehensive plan of care as evidenced by provider attestation and co-signature Inter-disciplinary care team collaboration (see longitudinal plan of care) Evaluation of current treatment plan related to  self management and patient's adherence to plan as established by provider  Dementia InterventionsNew goal. Evaluation of current treatment plan related to Dementia, Memory Deficits  self-management and patient's adherence to plan as established by provider. Discussed plans with patient for ongoing care management follow up and provided patient with direct contact information for care management team Social Work referral for anxiety/depression related to new diagnosis of dementia; Provided education regarding memory loss  Anxiety/Depression InterventionsNew goal. Evaluation of current treatment plan related to Anxiety and Depression, Memory Deficits self-management and patient's adherence to plan as established by provider. Discussed plans with patient for ongoing care management follow up and  provided patient with direct contact information for care management team Social Work referral for anxiety/depression related to new diagnosis and working on grief/loss of her son.;   Patient Goals/Self-Care Activities: Patient will self administer medications as prescribed as evidenced by self report/primary caregiver report  Patient will attend all scheduled provider appointments as evidenced by clinician review of documented attendance to scheduled appointments and patient/caregiver report Patient will continue to perform ADL's independently as evidenced by patient/caregiver report Patient will call provider office for new concerns or questions as evidenced by review of documented incoming telephone call notes and patient report Eat Healthy: fruits, vegetables, lean meat, healthy fats. Avoid: saturated fats found in red meat and full-fat dairy products; fried foods, processed meats.  Increase exercise/activity per provider recommendations. Plan to receive phone call from licensed clinical social worker. Plan to received call from clinic pharmacist Review education provided on strategies for memory and healthy diet     Consent to CCM Services: Ms. Nichter was given information about Chronic Care Management services including:  CCM service includes personalized support from designated clinical staff supervised by her physician, including individualized plan of care and coordination with other care providers 24/7 contact phone numbers for assistance for urgent and routine care needs. Service will only be billed when office clinical staff spend 20 minutes or more in a month to coordinate care. Only one practitioner may furnish and bill the service in a calendar month. The patient may stop CCM services at any time (effective at the end of the month) by phone call to the office staff. The patient will be responsible for cost sharing (co-pay) of up to 20% of the service fee (after annual deductible  is met).  Patient agreed to services and verbal consent obtained.   Patient verbalizes understanding of instructions provided today and agrees to view in [place].   Telephone follow up appointment with care management team member scheduled for: 01/13/21 The patient has been provided with contact information for the care management team and has been advised to call with any health related questions or concerns.   Thea Silversmith, RN, MSN, BSN, CCM Care Management Coordinator [facility] [phone]

## 2020-12-13 NOTE — Telephone Encounter (Signed)
Pt c/o BP issue: STAT if pt c/o blurred vision, one-sided weakness or slurred speech  1. What are your last 5 BP readings? 179/82 HR 75, 161/87 HR 76, 179/87 HR 72, 169/115 HR 69, 146/69 HR 65, 165/74 HR 73  2. Are you having any other symptoms (ex. Dizziness, headache, blurred vision, passed out)? no  3. What is your BP issue? Patient's daughter states the patient's BP medication was changed and they were told to call if her BP started to creep up. She states her BP has been getting high, but she is asymptomatic.

## 2020-12-13 NOTE — Telephone Encounter (Signed)
Returned call to daughter (ok per DPR)-states patients BP has been consistently elevated for the last week.   Denies change in diet/sodium intake.  Denies symptoms.  Reports medication was recently changed at [place] 10/25 with Dr. Stanford Breed.  OV 10/25-telmisartan stopped Started diltiazem 120 daily Continued coreg 25 BID   Advised will route to MD to review.  Daughter aware.

## 2020-12-13 NOTE — Telephone Encounter (Signed)
Left detailed message for daughter of dr Jacalyn Lefevre recommendations. She will call back if prescription needed.

## 2020-12-14 ENCOUNTER — Ambulatory Visit: Payer: Medicare Other | Admitting: Pharmacist

## 2020-12-14 DIAGNOSIS — G309 Alzheimer's disease, unspecified: Secondary | ICD-10-CM

## 2020-12-14 DIAGNOSIS — E782 Mixed hyperlipidemia: Secondary | ICD-10-CM

## 2020-12-14 DIAGNOSIS — N3010 Interstitial cystitis (chronic) without hematuria: Secondary | ICD-10-CM

## 2020-12-14 DIAGNOSIS — F0670 Mild neurocognitive disorder due to known physiological condition without behavioral disturbance: Secondary | ICD-10-CM

## 2020-12-14 DIAGNOSIS — I10 Essential (primary) hypertension: Secondary | ICD-10-CM

## 2020-12-15 ENCOUNTER — Ambulatory Visit (HOSPITAL_BASED_OUTPATIENT_CLINIC_OR_DEPARTMENT_OTHER): Payer: Medicare Other | Attending: Internal Medicine | Admitting: Cardiovascular Disease

## 2020-12-15 ENCOUNTER — Other Ambulatory Visit: Payer: Self-pay

## 2020-12-15 DIAGNOSIS — R002 Palpitations: Secondary | ICD-10-CM | POA: Diagnosis not present

## 2020-12-15 DIAGNOSIS — I10 Essential (primary) hypertension: Secondary | ICD-10-CM | POA: Insufficient documentation

## 2020-12-15 DIAGNOSIS — R0683 Snoring: Secondary | ICD-10-CM | POA: Diagnosis not present

## 2020-12-15 DIAGNOSIS — E669 Obesity, unspecified: Secondary | ICD-10-CM | POA: Insufficient documentation

## 2020-12-15 DIAGNOSIS — R0681 Apnea, not elsewhere classified: Secondary | ICD-10-CM | POA: Diagnosis not present

## 2020-12-15 DIAGNOSIS — G478 Other sleep disorders: Secondary | ICD-10-CM | POA: Insufficient documentation

## 2020-12-15 DIAGNOSIS — G4733 Obstructive sleep apnea (adult) (pediatric): Secondary | ICD-10-CM

## 2020-12-15 DIAGNOSIS — R5383 Other fatigue: Secondary | ICD-10-CM | POA: Insufficient documentation

## 2020-12-15 NOTE — Patient Instructions (Addendum)
Shannon [name],  It was a pleasure speaking with you today.  I have attached a summary of our visit today and information about your health goals. I have also included an application for patient assistance for Elmiron. Once you have completed the patient portion you can either take to Shannon Shannon [name] office or bring to me at Shannon Shannon [name] office and I can fax to Shannon Shannon [name].   If you have any questions or concerns, please feel free to contact me either at the phone number below or with a MyChart message.  I will plan to check back in with you in Tuesday, November 29th at 1:45pm by phone. Please let me know if this time is not convenient and we can reschedule.   Keep up the good work!  Shannon [name], PharmD Clinical Pharmacist [place] [phone] (direct line)  [phone] (main office number)  Pharmacist Clinical Goal(s):  Over the next 90 days, patient will verbalize ability to afford treatment regimen achieve control of hyperlipidemia and hypertension  as evidenced by LDL <70 and blood pressure <140/90 adhere to prescribed medication regimen as evidenced by refill history through collaboration with PharmD and provider.   Interventions: 1:1 collaboration with Shannon Lukes, MD regarding development and update of comprehensive plan of care as evidenced by provider attestation and co-signature Inter-disciplinary care team collaboration (see longitudinal plan of care) Comprehensive medication review performed; medication list updated in electronic medical record  Hypertension / Tachycardia Uncontrolled; blood pressure goal <140/90 BP Readings from Last 3 Encounters:  12/02/20 122/76  11/23/20 130/70  10/28/20 120/70    Current treatment: Carvedilol 25mg  twice a day (also helps with heart rate) Diltiazem 120mg  daily (also helps with heart rate)  Telmisartan 40mg  daily (restarted 12/13/2020) Interventions:  Discussed blood pressure goal and importance of  blood pressure control in heart and kidney health.  Discussed when was best time to take medications for blood pressure  Recommend checking blood pressure 2 to 3 times per week. Record for future visits.  Hyperlipidemia: Uncontrolled but close to goal LDL goal <70 Lipid Panel     Component Value Date/Time   Total Cholesterol  152 10/28/2020 1214   Triglycerides 207.0 (H) 10/28/2020 1214   HDLC (good cholesterol) 52.20 10/28/2020 1214   LDLC (bad cholesterol) 78.0 10/28/2020 1214   Current treatment:  Rosuvastatin 20mg  daily at bedtime Current dietary patterns: not following any particular diet.  Interventions:  Discussed limiting intake of saturated and trans fat - follow heart health diet.  Continue rosuvastatin.  Interstitial Cystitis: Managed by [facility] - Shannon Shannon [name] and Shannon [name]. [name]  Currently stable, but notes that cost of Elmiron has recently increased due to being in Medicare Coverage gap Current therapy:  Elmiron 100mg  twice a day Hydroxyzine 25mg  - take 12.5 to 25mg  up to twice a day if needed.  Interventions:  Sending patient application for Elmiron patient assistance. She will review and have her daughter help fill out. Will need to take to Shannon Shannon [name] office to sign and fax. Continue current medication and follow up with Shannon Shannon [name].   Health Maintenance:  Reviewed vaccination history and discussed benefits of Shingrix vaccination Patient considering Shingrix in 2023 when expect cost will decrease due to improved Medicare coverage.   Medication management Pharmacist Clinical Goal(s): Over the next 90 days, patient will work with PharmD and providers to maintain optimal medication adherence [facility] Inter[facility] Comprehensive medication review performed.  Reviewed refill history and assessed adherence Continue current medication management strategy Patient self care activities - Over the next 90 days,  patient will: Focus on medication adherence by filling medications appropriately  Take medications as prescribed Report any questions or concerns to PharmD and/or provider(s)  Patient Goals/Self-Care Activities Over the next 90 days, patient will:  take medications as prescribed,  focus on medication adherence by using weekly medication boxes / reminders,  check blood pressure 2 to 3 times per week, document, and provide at future appointments, and  collaborate with provider on medication access solutions  Follow Up Plan: Telephone follow up appointment with care management team member scheduled for:  2 to 3 weeks      The patient verbalized understanding of instructions, educational materials, and care plan provided today and agreed to receive a mailed copy of patient instructions, educational materials, and care plan.

## 2020-12-15 NOTE — Chronic Care Management (AMB) (Signed)
Chronic Care Management Pharmacy Note  12/15/2020 Name:  Shannon [name] MRN:  [id number] DOB:  02-10-1945  Summary: Cost of Elmiron has increased Sent application for patient assistnce  Subjective: Shannon [name] is an 75 y.o. year old female who is a primary patient of Mosie Lukes, MD.  The CCM team was consulted for assistance with disease management and care coordination needs.    Engaged with patient by telephone for initial visit in response to provider referral for pharmacy case management and/or care coordination services.   Consent to Services:  The patient was given the following information about Chronic Care Management services today, agreed to services, and gave verbal consent: 1. CCM service includes personalized support from designated clinical staff supervised by the primary care provider, including individualized plan of care and coordination with other care providers 2. 24/7 contact phone numbers for assistance for urgent and routine care needs. 3. Service will only be billed when office clinical staff spend 20 minutes or more in a month to coordinate care. 4. Only one practitioner may furnish and bill the service in a calendar month. 5.The patient may stop CCM services at any time (effective at the end of the month) by phone call to the office staff. 6. The patient will be responsible for cost sharing (co-pay) of up to 20% of the service fee (after annual deductible is met). Patient agreed to services and consent obtained.  Patient Care Team: Mosie Lukes, MD as PCP - General (Family Medicine) Stanford Breed Denice Bors, MD as PCP - Cardiology (Cardiology) Domingo Pulse, MD as Consulting Physician (Urology) Calvert Cantor, MD as Consulting Physician (Ophthalmology) Stanford Breed Denice Bors, MD as Consulting Physician (Cardiology) Luretha Rued, RN as Case Manager [name], RPH-CPP (Pharmacist)   Objective:  Lab Results  Component Value Date   CREATININE 0.82  10/28/2020   CREATININE 0.90 09/21/2020   CREATININE 0.90 07/16/2020    Lab Results  Component Value Date   HGBA1C 6.1 10/28/2020   Last diabetic Eye exam: No results found for: HMDIABEYEEXA  Last diabetic Foot exam: No results found for: HMDIABFOOTEX      Component Value Date/Time   CHOL 152 10/28/2020 1214   TRIG 207.0 (H) 10/28/2020 1214   HDL 52.20 10/28/2020 1214   CHOLHDL 3 10/28/2020 1214   VLDL 41.4 (H) 10/28/2020 1214   LDLCALC 80 06/05/2018 1215   LDLDIRECT 78.0 10/28/2020 1214    Hepatic Function Latest Ref Rng & Units 10/28/2020 07/05/2020 02/18/2020  Total Protein 6.0 - 8.3 g/dL 6.3 6.0 7.4  Albumin 3.5 - 5.2 g/dL 4.0 4.0 4.3  AST 0 - 37 U/L 21 20 43(H)  ALT 0 - 35 U/L _0 Alk Phosphatase 39 - 117 U/L 107 105 86  Total Bilirubin 0.2 - 1.2 mg/dL 0.3 0.3 0.5  Bilirubin, Direct 0.1 - 0.5 mg/dL - - -    Lab Results  Component Value Date/Time   TSH 3.11 10/28/2020 12:14 PM   TSH 2.61 07/05/2020 11:29 AM    CBC Latest Ref Rng & Units 10/28/2020 09/21/2020 07/16/2020  WBC 4.0 - 10.5 K/uL 7.4 6.7 13.8(H)  Hemoglobin 12.0 - 15.0 g/dL 11.4(L) 11.5(L) 8.3(L)  Hematocrit 36.0 - 46.0 % 35.8(L) 36.2 26.8(L)  Platelets 150.0 - 400.0 K/uL 230.0 266.0 185    Lab Results  Component Value Date/Time   VD25OH 45.39 10/28/2020 12:14 PM   VD25OH 26.79 (L) 07/05/2020 11:29 AM    Clinical ASCVD: Yes  The  10-year ASCVD risk score (Arnett DK, et al., 2019) is: 21%   Values used to calculate the score:     Age: 73 years     Sex: Female     Is Non-Hispanic African American: No     Diabetic: No     Tobacco smoker: No     Systolic Blood Pressure: 545 mmHg     Is BP treated: Yes     HDL Cholesterol: 52.2 mg/dL     Total Cholesterol: 152 mg/dL    Other:  DEXA 09/23/2020 AP Spine L3-L4 09/23/2020  Normal -0.8  AP Spine L3-L4 01/10/2018 Normal -0.6    DualFemur Neck Right 09/23/2020 Osteoporosis -3.0  DualFemur Neck Right 01/10/2018 Osteopenia -2.4    DualFemur Total  Mean 09/23/2020 Osteopenia -2.0  DualFemur Total Mean 01/10/2018 Osteopenia -1.5    Left Forearm Radius 33% 09/23/2020 76.2 Osteopenia -1.8  Left Forearm Radius 33% 01/10/2018 73.5 Osteopenia -1.4   Social History   Tobacco Use  Smoking Status Never  Smokeless Tobacco Never   BP Readings from Last 3 Encounters:  12/02/20 122/76  11/23/20 130/70  10/28/20 120/70   Pulse Readings from Last 3 Encounters:  12/02/20 68  11/23/20 65  10/28/20 (!) 59   Wt Readings from Last 3 Encounters:  12/02/20 171 lb 9.6 oz (77.8 kg)  11/23/20 171 lb 6.4 oz (77.7 kg)  10/28/20 168 lb 9.6 oz (76.5 kg)    Assessment: Review of patient past medical history, allergies, medications, health status, including review of consultants reports, laboratory and other test data, was performed as part of comprehensive evaluation and provision of chronic care management services.   SDOH:  (Social Determinants of Health) assessments and interventions performed:  SDOH Interventions    Flowsheet Row Most Recent Value  SDOH Interventions   Financial Strain Interventions Other (Comment)  [assisting patient in applying for Elimiron patient assistance.]       CCM Care Plan  Allergies  Allergen Reactions   Dilaudid [Hydromorphone Hcl] Itching    Medications Reviewed Today     Reviewed by [name], RPH-CPP (Pharmacist) on 12/14/20 at 1200  Med List Status: <None>   Medication Order Taking? Sig Documenting Provider Last Dose Status Informant  acetaminophen (TYLENOL) 500 MG tablet [id number] Yes Take 1-2 tablets (500-1,000 mg total) by mouth every 8 (eight) hours as needed for moderate pain. Derl Barrow, PA Taking Active            Med Note Antony Contras, Alicia Dec 14, 2020 11:44 AM)    albuterol (VENTOLIN HFA) 108 (90 Base) MCG/ACT inhaler [id number] Yes Inhale 1-2 puffs into the lungs every 6 (six) hours as needed for wheezing or shortness of breath. Mosie Lukes, MD Taking Active Self            Med Note Unk Lightning   Tue Dec 14, 2020 11:45 AM)    Camillo Flaming MEDICATION [id number] Yes Nitroglycerin 0.125% gel applied topically to the rectum TID x 6-8 weeks [place], MD Taking Active   Calcium Carbonate (CALCIUM 600 PO) [id number] Yes Take 1 tablet by mouth daily. [provider] Taking Active Self  carvedilol (COREG) 25 MG tablet [id number] Yes TAKE 1 TABLET TWICE DAILY WITH A MEAL Crenshaw, Denice Bors, MD Taking Active   cetirizine (ZYRTEC) 10 MG tablet [id number] Yes Take 10 mg by mouth daily. [provider] Taking Active Self  COVID-19 mRNA bivalent vaccine, Pfizer, injection [id number] No Inject into the  muscle.  Patient not taking: Reported on 12/14/2020   Carlyle Basques, MD Not Taking Active   diltiazem (CARDIZEM CD) 120 MG 24 hr capsule [id number] Yes Take 1 capsule (120 mg total) by mouth daily. Lelon Perla, MD Taking Active   docusate sodium (COLACE) 250 MG capsule [id number] Yes Take 250 mg by mouth daily as needed for constipation. [provider] Taking Active Self  donepezil (ARICEPT) 10 MG tablet [id number] Yes TAKE 1 TABLET (10 MG TOTAL) BY MOUTH AT BEDTIME. Pieter Partridge, DO Taking Active   ergocalciferol (VITAMIN D2) 1.25 MG (50000 UT) capsule [id number] Yes Take 50,000 Units by mouth once a week. [provider] Taking Active   famotidine (PEPCID) 20 MG tablet [id number] Yes TAKE 1 TABLET BY MOUTH TWICE A DAY Mosie Lukes, MD Taking Active   ferrous sulfate 325 (65 FE) MG tablet [id number] Yes Take 325 mg by mouth daily with breakfast. [provider] Taking Active Self  folic acid (FOLVITE) 1 MG tablet [id number] Yes Take 1 tablet (1 mg total) by mouth daily.  Patient taking differently: Take 1 mg by mouth at bedtime.   Mosie Lukes, MD Taking Active   gabapentin (NEURONTIN) 300 MG capsule [id number] Yes Take 1 capsule (300 mg total) by mouth 2 (two) times daily as needed.  Patient taking  differently: Take 300 mg by mouth 2 (two) times daily.   Mosie Lukes, MD Taking Active   hydrOXYzine (ATARAX/VISTARIL) 25 MG tablet [id number] Yes Take 0.5-1 tablets (12.5-25 mg total) by mouth 2 (two) times daily as needed. Mosie Lukes, MD Taking Active            Med Note Anselm Lis Dec 13, 2020  1:31 PM) Reports takes 1/2 -1 tablet twice a day every day  Melatonin 10 MG CAPS [id number] Yes Take 1 capsule by mouth at bedtime. [provider] Taking Active Self  methocarbamol (ROBAXIN) 500 MG tablet [id number] Yes Take 1 tablet (500 mg total) by mouth every 6 (six) hours as needed for muscle spasms. Edmisten, [place] An[place], [place] Taking Active   Multiple Vitamin (MULTIVITAMIN WITH MINERALS) TABS tablet [id number] Yes Take 1 tablet by mouth daily. [provider] Taking Active   omeprazole (PRILOSEC) 20 MG capsule [id number] Yes Take 1 capsule (20 mg total) by mouth daily. Mosie Lukes, MD Taking Active   pentosan polysulfate (ELMIRON) 100 MG capsule [id number] Yes Take 100 mg by mouth 2 (two) times daily.  Perkins, Alexzandrew L, PA-C Taking Active Self  rosuvastatin (CRESTOR) 20 MG tablet [id number] Yes TAKE 1 TABLET AT BEDTIME Lelon Perla, MD Taking Active   telmisartan (MICARDIS) 40 MG tablet [id number] Yes Take 1 tablet (40 mg total) by mouth daily. Lelon Perla, MD Taking Active   venlafaxine XR (EFFEXOR-XR) 75 MG 24 hr capsule [id number] Yes Take 3 capsules (225 mg total) by mouth daily with breakfast. Mosie Lukes, MD Taking Active             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   Right knee pain 07/10/2020   Urinary frequency 02/11/2020   Debility 12/23/2017   Left knee pain 12/23/2017   Vitamin D deficiency 06/14/2017   Vertebral fracture,  osteoporotic 04/05/2016   Left-sided back pain 03/06/2016   Chest  pain 02/27/2016   Syncope 02/27/2016   Cataracts, bilateral 12/28/2015   Recurrent falls 12/28/2015   Family history- stomach cancer 09/08/2015   Family history of cancer 09/08/2015   [name] lesion 06/21/2015   Obesity 09/28/2014   Interstitial cystitis 09/28/2014   Pain in joint, ankle and foot 09/28/2014   Abnormal nuclear stress test 12/16/2013   Sternal pain 11/06/2013   Postoperative anemia due to acute blood loss 02/13/2013   Congestive dilated cardiomyopathy ([place]) 12/30/2012   Breast cancer of lower-inner quadrant of right female breast 10/21/2012   Constipation 03/03/2012   OA (osteoarthritis) 09/07/2011   Dermatitis    Hyponatremia 08/28/2011   Osteoporosis 03/07/2011   Prediabetes 06/15/2009   Lipoma 02/10/2009   Dyspnea on exertion 02/10/2009   Hyperlipidemia, mixed 08/11/2008   Major depressive disorder with anxious distress 08/11/2008   Essential hypertension 08/11/2008   GERD (gastroesophageal reflux disease) 08/11/2008   Craniopharyngioma 2000    Immunization History  Administered Date(s) Administered   Fluad Quad(high Dose 65+) 10/14/2018, 12/02/2020   Influenza Split 11/11/2010, 11/14/2011   Influenza Whole 02/10/2009   Influenza, High Dose Seasonal PF 11/04/2015, 10/16/2017, 11/20/2019   Influenza,inj,Quad PF,6+ Mos 10/18/2012, 10/10/2013, 12/21/2014   Influenza-Unspecified 08/30/2016   PFIZER Comirnaty(Gray Top)Covid-19 Tri-Sucrose Vaccine 07/05/2020   PFIZER(Purple Top)SARS-COV-2 Vaccination 03/30/2019, 04/29/2019, 11/20/2019   Pfizer Covid-19 Vaccine Bivalent Booster 61yr & up 10/28/2020   Pneumococcal Conjugate-13 12/21/2014   Pneumococcal Polysaccharide-23 09/04/2011   Td 01/30/2001, 12/28/2015   Zoster Recombinat (Shingrix) 05/09/2016    Conditions to be addressed/monitored: HTN, HLD, Anxiety, Depression, Dementia, GERD, Osteoporosis, and interstitial cystitis  Care  Plan : General Pharmacy (Adult)  Updates made by ECherre Robins RPH-CPP since 12/15/2020 12:00 AM     Problem: Hyperlipidemia; hypertenstion; interstitial cystitis; osteopenia; tachycardia; GERD; alzheimers disease; depression / anxiety; pre diabetes; cardiomyopathy; history of breast cancer; osteoarthritis      Long-Range Goal: Provide education, support and care coordination for medication therapy and chronic conditions   Start Date: 12/14/2020  Priority: High  Note:   Current Barriers:  Unable to independently afford treatment regimen Unable to achieve control of hyperlipidemia or hypertension   Pharmacist Clinical Goal(s):  Over the next 90 days, patient will verbalize ability to afford treatment regimen achieve control of hyperlipidemia and hypertension  as evidenced by LDL <70 and blood pressure <140/90 adhere to prescribed medication regimen as evidenced by refill history  through collaboration with PharmD and provider.   Interventions: 1:1 collaboration with BMosie Lukes MD regarding development and update of comprehensive plan of care as evidenced by provider attestation and co-signature Inter-disciplinary care team collaboration (see longitudinal plan of care) Comprehensive medication review performed; medication list updated in electronic medical record  Hypertension / Tachycardia Uncontrolled; blood pressure goal <140/90 Current treatment: Carvedilol 252mtwice a day (also helps with heart rate) Diltiazem 12097maily (also helps with heart rate)  Telmisartan 90m1mily (restarted 12/13/2020) Current home readings: Patient did not have exact numbers but knew that recently has been elevated Denies hypotensive/hypertensive symptoms Interventions:  Discussed blood pressure goal and importance of blood pressure control in heart and kidney health.  Discussed when was best time to take medications for blood pressure   Hyperlipidemia: Uncontrolled but close to goalLDL  goal <70 Current treatment:  Rosuvastatin 20mg49mly at bedtime Current dietary patterns: not following any particular diet.  Interventions:  Discussed limiting intake of saturated and trans fat - follow heart health diet.  Continue rosuvastatin. Could consider increasing to 90mg 13m  future if LDL remains over 70.  Interstitial Cystitis: Managed by [facility] - Dr Amalia Hailey and Dr Chauncey Cruel. [name]  Currently stable, but notes that cost of Elmiron has recently increased due to being in Medicare Coverage gap Current therapy:  Elmiron 138m twice a day Hydroxyzine 254m- take 12.5 to 2529mp to twice a day if needed.  Interventions:  Sending patient application for Elmiron patient assistance. She will review and have her daughter help fill out. Will need to take to Dr EvaAmalia Haileyfice to sign and fax. Continue current medication and follow up with Dr EvaAmalia Hailey Health Maintenance:  Reviewed vaccination history and discussed benefits of Shingrix vaccination Patient considering Shingrix in 2023 when expect cost will decrease due to improved Medicare coverage.   Medication management Pharmacist Clinical Goal(s): Over the next 90 days, patient will work with PharmD and providers to maintain optimal medication adherence [facility] Interventions Comprehensive medication review performed. Reviewed refill history and assessed adherence Continue current medication management strategy Patient self care activities - Over the next 90 days, patient will: Focus on medication adherence by filling medications appropriately  Take medications as prescribed Report any questions or concerns to PharmD and/or provider(s)  Patient Goals/Self-Care Activities Over the next 90 days, patient will:  take medications as prescribed,  focus on medication adherence by using weekly medication boxes / reminders,  check blood pressure 2 to 3 times per week, document, and provide at  future appointments, and  collaborate with provider on medication access solutions  Follow Up Plan: Telephone follow up appointment with care management team member scheduled for:  2 to 3 weeks         Medication Assistance:  Mailed patient application for Elmiron patient assistance.   [facility]  [id number] [place] [id number]: [phone]: [phone] - [place]: [phone]: [phone] pill box? Yes - daughter is a nursMarine scientist she helps with patient's weekly medication boxes.  Pt endorses 95% compliance  Follow Up:  Patient agrees to Care Plan and Follow-up.  Plan: Telephone follow up appointment with care management team member scheduled for:  2 to 3 weeks.   TammCherre RobinsarmD Clinical Pharmacist [facility]

## 2020-12-16 ENCOUNTER — Telehealth: Payer: Self-pay | Admitting: *Deleted

## 2020-12-16 NOTE — Chronic Care Management (AMB) (Signed)
  Care Management   Note  12/16/2020 Name: [name] MRN: [id number] DOB: 13-Mar-1945  Shannon [name] is a 75 y.o. year old female who is a primary care patient of Mosie Lukes, MD and is actively engaged with the care management team. I reached out to Jeral Pinch by phone today to assist with scheduling an initial visit with the Licensed Clinical Social Worker  Follow up plan: Telephone appointment with care management team member scheduled for:12/30/20  [place] Management  Direct Dial: [phone]

## 2020-12-16 NOTE — Chronic Care Management (AMB) (Signed)
  Care Management   Note  12/16/2020 Name: Shannon [name] MRN: [id number] DOB: 12-01-1945  Shannon [name] is a 75 y.o. year old female who is a primary care patient of Mosie Lukes, MD and is actively engaged with the care management team. I reached out to Jeral Pinch by phone today to assist with scheduling an initial visit with the Licensed Clinical Social Worker  Follow up plan: Unsuccessful telephone outreach attempt made. A HIPAA compliant phone message was left for the patient providing contact information and requesting a return call.  The care management team will reach out to the patient again over the next 7 days.  If patient returns call to provider office, please advise to call [place] at [phone].  [place] Management  Direct Dial: [phone]

## 2020-12-16 NOTE — Progress Notes (Signed)
Scheduled 12/30/20  [place] Management  Direct Dial: [phone]

## 2020-12-21 ENCOUNTER — Telehealth: Payer: Self-pay | Admitting: Cardiology

## 2020-12-21 NOTE — Telephone Encounter (Signed)
Spoke with pt daughter, Follow up scheduled and aware sleep study has not been read as of today so not sure what she is seeing.

## 2020-12-21 NOTE — Telephone Encounter (Signed)
Spoke with pt daughter, she reports the patient is still having palpitations in the evening. They have tried the diltiazem CD both at night and in the morning and it has not made a difference in her palpitations. Aware will forward to dr Stanford Breed to review and advise. She is also asking about the sleep study and the possible arrhythmias on the sleep study. Aware will send a message to the sleep coordinator to see if any results are available.

## 2020-12-21 NOTE — Telephone Encounter (Signed)
Pt c/o medication issue:  1. Name of Medication:  diltiazem (CARDIZEM CD) 120 MG 24 hr capsule  2. How are you currently taking this medication (dosage and times per day)?  As prescribed  3. Are you having a reaction (difficulty breathing--STAT)?  No   4. What is your medication issue?   Patient's daughter is following up regarding Diltiazem. She states the patient has still been having palpitations at night and she assumes the medication needs to be adjusted. Please return call to discuss when able.

## 2020-12-22 ENCOUNTER — Ambulatory Visit: Payer: Medicare Other | Admitting: Podiatry

## 2020-12-28 ENCOUNTER — Ambulatory Visit: Payer: Medicare Other | Admitting: Pharmacist

## 2020-12-28 DIAGNOSIS — E782 Mixed hyperlipidemia: Secondary | ICD-10-CM

## 2020-12-28 DIAGNOSIS — N3010 Interstitial cystitis (chronic) without hematuria: Secondary | ICD-10-CM

## 2020-12-28 DIAGNOSIS — I10 Essential (primary) hypertension: Secondary | ICD-10-CM

## 2020-12-28 NOTE — Patient Instructions (Signed)
Shannon [name],  It was a pleasure speaking with you today.  I have attached a summary of our visit today and information about your health goals.   If you have any questions or concerns, please feel free to contact me either at the phone number below or with a MyChart message.   Keep up the good work!  [name], PharmD Clinical Pharmacist [facility] [phone] (direct line)  [phone] (main office number)  Current Barriers:  Unable to independently afford treatment regimen - Elimiron Unable to achieve control of hyperlipidemia or hypertension   Pharmacist Clinical Goal(s):  Over the next 90 days, patient will verbalize ability to afford treatment regimen achieve control of hyperlipidemia and hypertension  as evidenced by LDL <70 and blood pressure <140/90 adhere to prescribed medication regimen as evidenced by refill history through collaboration with PharmD and provider.   Interventions: 1:1 collaboration with Mosie Lukes, MD regarding development and update of comprehensive plan of care as evidenced by provider attestation and co-signature Inter-disciplinary care team collaboration (see longitudinal plan of care) Comprehensive medication review performed; medication list updated in electronic medical record  Hypertension / Tachycardia Uncontrolled - home blood pressure improved since telmisartan added back; blood pressure goal <140/90 BP Readings from Last 3 Encounters:  12/02/20 122/76  11/23/20 130/70  10/28/20 120/70    Current treatment: Carvedilol 25mg  twice a day (also helps with heart rate) Diltiazem 120mg  daily (also helps with heart rate)  Telmisartan 40mg  daily (restarted 12/13/2020) Interventions:  Discussed blood pressure goal and importance of blood pressure control in heart and kidney health.  Discussed when was best time to take medications for blood pressure  Recommend checking blood pressure 2 to 3 times per  week. Record for future visits.  Hyperlipidemia: Uncontrolled but close to goal LDL goal <70 Lipid Panel     Component Value Date/Time   Total Cholesterol  152 10/28/2020 1214   Triglycerides 207.0 (H) 10/28/2020 1214   HDLC (good cholesterol) 52.20 10/28/2020 1214   LDLC (bad cholesterol) 78.0 10/28/2020 1214   Current treatment:  Rosuvastatin 20mg  daily at bedtime Current dietary patterns: not following any particular diet.  Interventions:  Discussed limiting intake of saturated and trans fat - follow heart health diet.  Continue rosuvastatin.  Interstitial Cystitis: Managed by [facility] - Dr Amalia Hailey and Dr Chauncey Cruel. [name]  Currently stable, but notes that cost of Elmiron has recently increased due to being in Medicare Coverage gap Current therapy:  Elmiron 100mg  twice a day Hydroxyzine 25mg  - take 12.5 to 25mg  up to twice a day if needed.  Interventions:  Sent link to Elmiron application since patient has not received in mail yet. Recommended taking to appointment with urologist tomorrow.  Continue current medication and follow up with Dr Amalia Hailey.   Health Maintenance:  Reviewed vaccination history and discussed benefits of Shingrix vaccination Patient considering Shingrix in 2023 when expect cost will decrease due to improved Medicare coverage.   Medication management Pharmacist Clinical Goal(s): Over the next 90 days, patient will work with PharmD and providers to maintain optimal medication adherence [facility] Interventions Comprehensive medication review performed. Reviewed refill history and assessed adherence Continue current medication management strategy Patient self care activities - Over the next 90 days, patient will: Focus on medication adherence by filling medications appropriately  Take medications as prescribed Report any questions or concerns to PharmD and/or provider(s)  Patient Goals/Self-Care  Activities Over  the next 90 days, patient will:  take medications as prescribed,  focus on medication adherence by using weekly medication boxes / reminders,  check blood pressure 2 to 3 times per week, document, and provide at future appointments, and  collaborate with provider on medication access solutions  Follow Up Plan: Telephone follow up appointment with care management team member scheduled for:  2 to 3 weeks    Patient verbalizes understanding of instructions provided today and agrees to view in [place].

## 2020-12-28 NOTE — Chronic Care Management (AMB) (Signed)
Chronic Care Management Pharmacy Note  12/28/2020 Name:  Shannon [name] MRN:  [id number] DOB:  1945-09-29  Summary: Cost of Elmiron has increased. Resent application links in [place] message. Patient to take application to appointment with urologist tomorrow 12/29/2020.  Telmisartan restarted 12/13/2020 - patient report blood pressure improved by still variable. She will see cardiologist tomorrow 12/29/2020.  Subjective: Shannon [name] is an 75 y.o. year old female who is a primary patient of Mosie Lukes, MD.  The CCM team was consulted for assistance with disease management and care coordination needs.    Engaged with patient by telephone for follow up visit in response to provider referral for pharmacy case management and/or care coordination services.   Consent to Services:  The patient was given information about Chronic Care Management services, agreed to services, and gave verbal consent prior to initiation of services.  Please see initial visit note for detailed documentation.   Patient Care Team: Mosie Lukes, MD as PCP - General (Family Medicine) Stanford Breed Denice Bors, MD as PCP - Cardiology (Cardiology) Domingo Pulse, MD as Consulting Physician (Urology) Calvert Cantor, MD as Consulting Physician (Ophthalmology) Stanford Breed Denice Bors, MD as Consulting Physician (Cardiology) Luretha Rued, RN as Case Manager [name], RPH-CPP (Pharmacist) Francis Gaines, LCSW as Social Worker   Objective:  Lab Results  Component Value Date   CREATININE 0.82 10/28/2020   CREATININE 0.90 09/21/2020   CREATININE 0.90 07/16/2020    Lab Results  Component Value Date   HGBA1C 6.1 10/28/2020   Last diabetic Eye exam: No results found for: HMDIABEYEEXA  Last diabetic Foot exam: No results found for: HMDIABFOOTEX      Component Value Date/Time   CHOL 152 10/28/2020 1214   TRIG 207.0 (H) 10/28/2020 1214   HDL 52.20 10/28/2020 1214   CHOLHDL 3 10/28/2020 1214   VLDL 41.4  (H) 10/28/2020 1214   LDLCALC 80 06/05/2018 1215   LDLDIRECT 78.0 10/28/2020 1214    Hepatic Function Latest Ref Rng & Units 10/28/2020 07/05/2020 02/18/2020  Total Protein 6.0 - 8.3 g/dL 6.3 6.0 7.4  Albumin 3.5 - 5.2 g/dL 4.0 4.0 4.3  AST 0 - 37 U/L 21 20 43(H)  ALT 0 - 35 U/L _0 Alk Phosphatase 39 - 117 U/L 107 105 86  Total Bilirubin 0.2 - 1.2 mg/dL 0.3 0.3 0.5  Bilirubin, Direct 0.1 - 0.5 mg/dL - - -    Lab Results  Component Value Date/Time   TSH 3.11 10/28/2020 12:14 PM   TSH 2.61 07/05/2020 11:29 AM    CBC Latest Ref Rng & Units 10/28/2020 09/21/2020 07/16/2020  WBC 4.0 - 10.5 K/uL 7.4 6.7 13.8(H)  Hemoglobin 12.0 - 15.0 g/dL 11.4(L) 11.5(L) 8.3(L)  Hematocrit 36.0 - 46.0 % 35.8(L) 36.2 26.8(L)  Platelets 150.0 - 400.0 K/uL 230.0 266.0 185    Lab Results  Component Value Date/Time   VD25OH 45.39 10/28/2020 12:14 PM   VD25OH 26.79 (L) 07/05/2020 11:29 AM    Clinical ASCVD: Yes  The 10-year ASCVD risk score (Arnett DK, et al., 2019) is: 21%   Values used to calculate the score:     Age: 11 years     Sex: Female     Is Non-Hispanic African American: No     Diabetic: No     Tobacco smoker: No     Systolic Blood Pressure: 770 mmHg     Is BP treated: Yes     HDL Cholesterol: 52.2  mg/dL     Total Cholesterol: 152 mg/dL    Other:  DEXA 09/23/2020 AP Spine L3-L4 09/23/2020  Normal -0.8  AP Spine L3-L4 01/10/2018 Normal -0.6    DualFemur Neck Right 09/23/2020 Osteoporosis -3.0  DualFemur Neck Right 01/10/2018 Osteopenia -2.4    DualFemur Total Mean 09/23/2020 Osteopenia -2.0  DualFemur Total Mean 01/10/2018 Osteopenia -1.5    Left Forearm Radius 33% 09/23/2020 76.2 Osteopenia -1.8  Left Forearm Radius 33% 01/10/2018 73.5 Osteopenia -1.4   Social History   Tobacco Use  Smoking Status Never  Smokeless Tobacco Never   BP Readings from Last 3 Encounters:  12/02/20 122/76  11/23/20 130/70  10/28/20 120/70   Pulse Readings from Last 3 Encounters:   12/02/20 68  11/23/20 65  10/28/20 (!) 59   Wt Readings from Last 3 Encounters:  12/15/20 165 lb (74.8 kg)  12/02/20 171 lb 9.6 oz (77.8 kg)  11/23/20 171 lb 6.4 oz (77.7 kg)    Assessment: Review of patient past medical history, allergies, medications, health status, including review of consultants reports, laboratory and other test data, was performed as part of comprehensive evaluation and provision of chronic care management services.   SDOH:  (Social Determinants of Health) assessments and interventions performed:     CCM Care Plan  Allergies  Allergen Reactions   Dilaudid [Hydromorphone Hcl] Itching    Medications Reviewed Today     Reviewed by [name], RPH-CPP (Pharmacist) on 12/28/20 at [place] List Status: <None>   Medication Order Taking? Sig Documenting Provider Last Dose Status Informant  acetaminophen (TYLENOL) 500 MG tablet [id number] Yes Take 1-2 tablets (500-1,000 mg total) by mouth every 8 (eight) hours as needed for moderate pain. Derl Barrow, PA Taking Active            Med Note Antony Contras, [place] Dec 14, 2020 11:44 AM)    albuterol (VENTOLIN HFA) 108 (90 Base) MCG/ACT inhaler [id number] Yes Inhale 1-2 puffs into the lungs every 6 (six) hours as needed for wheezing or shortness of breath. Mosie Lukes, MD Taking Active Self           Med Note Antony Contras, Sandre Kitty   Tue Dec 14, 2020 11:45 AM)    Camillo Flaming MEDICATION [id number] No Nitroglycerin 0.125% gel applied topically to the rectum TID x 6-8 weeks  Patient not taking: Reported on 12/28/2020   Thornton Park, MD Not Taking Active   Calcium Carbonate (CALCIUM 600 PO) [id number] Yes Take 1 tablet by mouth daily. [provider] Taking Active Self  carvedilol (COREG) 25 MG tablet [id number] Yes TAKE 1 TABLET TWICE DAILY WITH A MEAL Crenshaw, Denice Bors, MD Taking Active   cetirizine (ZYRTEC) 10 MG tablet [id number] Yes Take 10 mg by mouth daily. [provider]  Taking Active Self  diltiazem (CARDIZEM CD) 120 MG 24 hr capsule [id number] Yes Take 1 capsule (120 mg total) by mouth daily. Lelon Perla, MD Taking Active   docusate sodium (COLACE) 250 MG capsule [id number] Yes Take 250 mg by mouth daily as needed for constipation. [provider] Taking Active Self  donepezil (ARICEPT) 10 MG tablet [id number] Yes TAKE 1 TABLET (10 MG TOTAL) BY MOUTH AT BEDTIME. Pieter Partridge, DO Taking Active   ergocalciferol (VITAMIN D2) 1.25 MG (50000 UT) capsule [id number] Yes Take 50,000 Units by mouth once a week. [provider] Taking Active   famotidine (PEPCID) 20 MG tablet [id number] Yes TAKE 1 TABLET BY  MOUTH TWICE A DAY Mosie Lukes, MD Taking Active   ferrous sulfate 325 (65 FE) MG tablet [id number] Yes Take 325 mg by mouth daily with breakfast. [provider] Taking Active Self  folic acid (FOLVITE) 1 MG tablet [id number] Yes Take 1 tablet (1 mg total) by mouth daily.  Patient taking differently: Take 1 mg by mouth at bedtime.   Mosie Lukes, MD Taking Active   gabapentin (NEURONTIN) 300 MG capsule [id number] Yes Take 1 capsule (300 mg total) by mouth 2 (two) times daily as needed.  Patient taking differently: Take 300 mg by mouth 2 (two) times daily.   Mosie Lukes, MD Taking Active   hydrOXYzine (ATARAX/VISTARIL) 25 MG tablet [id number] Yes Take 0.5-1 tablets (12.5-25 mg total) by mouth 2 (two) times daily as needed. Mosie Lukes, MD Taking Active            Med Note Anselm Lis Dec 13, 2020  1:31 PM) Reports takes 1/2 -1 tablet twice a day every day  Melatonin 10 MG CAPS [id number] Yes Take 1 capsule by mouth at bedtime. [provider] Taking Active Self  methocarbamol (ROBAXIN) 500 MG tablet [id number] Yes Take 1 tablet (500 mg total) by mouth every 6 (six) hours as needed for muscle spasms. Edmisten, [place] An[place], [place] Taking Active   Multiple Vitamin (MULTIVITAMIN WITH MINERALS) TABS tablet [id number] Yes  Take 1 tablet by mouth daily. [provider] Taking Active   omeprazole (PRILOSEC) 20 MG capsule [id number] Yes Take 1 capsule (20 mg total) by mouth daily. Mosie Lukes, MD Taking Active   pentosan polysulfate (ELMIRON) 100 MG capsule [id number] Yes Take 100 mg by mouth 2 (two) times daily.  Perkins, Alexzandrew L, PA-C Taking Active Self  rosuvastatin (CRESTOR) 20 MG tablet [id number] Yes TAKE 1 TABLET AT BEDTIME Lelon Perla, MD Taking Active   telmisartan (MICARDIS) 40 MG tablet [id number] Yes Take 1 tablet (40 mg total) by mouth daily. Lelon Perla, MD Taking Active   venlafaxine XR (EFFEXOR-XR) 75 MG 24 hr capsule [id number] Yes Take 3 capsules (225 mg total) by mouth daily with breakfast. Mosie Lukes, MD Taking Active             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   Right knee pain 07/10/2020   Urinary frequency 02/11/2020   Debility 12/23/2017   Left knee pain 12/23/2017   Vitamin D deficiency 06/14/2017   Vertebral fracture, osteoporotic 04/05/2016   Left-sided back pain 03/06/2016   Chest pain 02/27/2016   Syncope 02/27/2016   Cataracts, bilateral 12/28/2015   Recurrent falls 12/28/2015   Family history- stomach cancer 09/08/2015   Family history of cancer 09/08/2015   [name] lesion 06/21/2015   Obesity 09/28/2014   Interstitial cystitis 09/28/2014   Pain in joint, ankle and foot 09/28/2014   Abnormal nuclear stress test 12/16/2013   Sternal pain 11/06/2013   Postoperative anemia due to acute blood loss 02/13/2013   Congestive dilated cardiomyopathy ([place]) 12/30/2012   Breast cancer of lower-inner quadrant of right female breast 10/21/2012   Constipation 03/03/2012   OA (osteoarthritis) 09/07/2011   Dermatitis    Hyponatremia 08/28/2011    Osteoporosis 03/07/2011   Prediabetes 06/15/2009   Lipoma 02/10/2009   Dyspnea on exertion 02/10/2009  Hyperlipidemia, mixed 08/11/2008   Major depressive disorder with anxious distress 08/11/2008   Essential hypertension 08/11/2008   GERD (gastroesophageal reflux disease) 08/11/2008   Craniopharyngioma 2000    Immunization History  Administered Date(s) Administered   Fluad Quad(high Dose 65+) 10/14/2018, 12/02/2020   Influenza Split 11/11/2010, 11/14/2011   Influenza Whole 02/10/2009   Influenza, High Dose Seasonal PF 11/04/2015, 10/16/2017, 11/20/2019   Influenza,inj,Quad PF,6+ Mos 10/18/2012, 10/10/2013, 12/21/2014   Influenza-Unspecified 08/30/2016   PFIZER Comirnaty(Gray Top)Covid-19 Tri-Sucrose Vaccine 07/05/2020   PFIZER(Purple Top)SARS-COV-2 Vaccination 03/30/2019, 04/29/2019, 11/20/2019   Pfizer Covid-19 Vaccine Bivalent Booster 71yr & up 10/28/2020   Pneumococcal Conjugate-13 12/21/2014   Pneumococcal Polysaccharide-23 09/04/2011   Td 01/30/2001, 12/28/2015   Zoster Recombinat (Shingrix) 05/09/2016    Conditions to be addressed/monitored: HTN, HLD, Anxiety, Depression, Dementia, GERD, Osteoporosis, and interstitial cystitis  Care Plan : General Pharmacy (Adult)  Updates made by ECherre Robins RPH-CPP since 12/28/2020 12:00 AM     Problem: Hyperlipidemia; hypertenstion; interstitial cystitis; osteopenia; tachycardia; GERD; alzheimers disease; depression / anxiety; pre diabetes; cardiomyopathy; history of breast cancer; osteoarthritis      Long-Range Goal: Provide education, support and care coordination for medication therapy and chronic conditions   Start Date: 12/14/2020  Priority: High  Note:   Current Barriers:  Unable to independently afford treatment regimen Unable to achieve control of hyperlipidemia or hypertension   Pharmacist Clinical Goal(s):  Over the next 90 days, patient will verbalize ability to afford treatment regimen achieve control of  hyperlipidemia and hypertension  as evidenced by LDL <70 and blood pressure <140/90 adhere to prescribed medication regimen as evidenced by refill history  through collaboration with PharmD and provider.   Interventions: 1:1 collaboration with BMosie Lukes MD regarding development and update of comprehensive plan of care as evidenced by provider attestation and co-signature Inter-disciplinary care team collaboration (see longitudinal plan of care) Comprehensive medication review performed; medication list updated in electronic medical record  Hypertension / Tachycardia Uncontrolled but improving; blood pressure goal <140/90 Current treatment: Carvedilol 263mtwice a day (also helps with heart rate) Diltiazem 12091maily (also helps with heart rate)  Telmisartan 31m93mily (restarted 12/13/2020) Current home readings: Patient states blood pressure improving over the last 2 weeks; has been 135 to 150 / 80 She has appointment to see cardiologist tomorrow 12/29/2020 Denies hypotensive/hypertensive symptoms Interventions:  Discussed blood pressure goal and importance of blood pressure control in heart and kidney health.  Discussed when was best time to take medications for blood pressure  Continue current blood pressure regimen   Hyperlipidemia: Uncontrolled but close to goalLDL goal <70 Current treatment:  Rosuvastatin 20mg39mly at bedtime Current dietary patterns: not following any particular diet.  Interventions:  Discussed limiting intake of saturated and trans fat - follow heart health diet.  [facility] last rosuvastatin refill - patient filled 90 days suppoy on 08/14/2020 and 10/22/2020. Continue rosuvastatin. Could consider increasing to 31mg 68muture if LDL remains over 70.  Interstitial Cystitis: [facility] Amalia Haileyr S. TanChauncey Cruelenbaum  Currently stable, but notes that cost of Elmiron has recently increased due to being  in Medicare Coverage gap. Mailed application for Elmiron patient assistance program last week but patient has not received yet.  Current therapy:  Elmiron 100mg t46m a day Hydroxyzine 25mg - 60m 12.5 to 25mg up 59mwice a day if needed.  Interventions:  Sent link to Elmiron application thru my chart message. Patient has appointment  to see urology [facility] tomorrow 12/29/2020. Recommended she take application to appointment.  Continue current medication and follow up with Dr Amalia Hailey.   Health Maintenance:  Reviewed vaccination history and discussed benefits of Shingrix vaccination Patient considering Shingrix in 2023 when expect cost will decrease due to improved Medicare coverage.   Medication management Pharmacist Clinical Goal(s): Over the next 90 days, patient will work with PharmD and providers to maintain optimal medication adherence [facility] Interventions Comprehensive medication review performed. Reviewed refill history and assessed adherence Continue current medication management strategy Patient self care activities - Over the next 90 days, patient will: Focus on medication adherence by filling medications appropriately  Take medications as prescribed Report any questions or concerns to PharmD and/or provider(s)  Patient Goals/Self-Care Activities Over the next 90 days, patient will:  take medications as prescribed,  focus on medication adherence by using weekly medication boxes / reminders,  check blood pressure 2 to 3 times per week, document, and provide at future appointments, and  collaborate with provider on medication access solutions  Follow Up Plan: Telephone follow up appointment with care management team member scheduled for:  2 to 3 weeks         Medication Assistance:  Resent patient application for Elmiron patient assistance.  Orginally mailed 12/14/2020. Today sent link to patient's mychart.  Patient's preferred pharmacy  is:  [id number] [place] [address] [address]Phone: [phone]: [phone] [facility] [place]Phone: [phone]: [phone] Uses pill box? Yes - daughter is a nMarine scientistand she helps with patient's weekly medication boxes.  Pt endorses 95% compliance  Follow Up:  Patient agrees to Care Plan and Follow-up.  Plan: Telephone follow up appointment with care management team member scheduled for:  2 to 3 weeks.   TCherre Robins PharmD Clinical Pharmacist [facility]

## 2020-12-29 ENCOUNTER — Ambulatory Visit (INDEPENDENT_AMBULATORY_CARE_PROVIDER_SITE_OTHER): Payer: Medicare Other | Admitting: Cardiology

## 2020-12-29 ENCOUNTER — Other Ambulatory Visit: Payer: Self-pay

## 2020-12-29 ENCOUNTER — Encounter: Payer: Self-pay | Admitting: Cardiology

## 2020-12-29 VITALS — BP 114/64 | HR 73 | Ht 61.0 in | Wt 167.8 lb

## 2020-12-29 DIAGNOSIS — I420 Dilated cardiomyopathy: Secondary | ICD-10-CM

## 2020-12-29 DIAGNOSIS — R000 Tachycardia, unspecified: Secondary | ICD-10-CM | POA: Diagnosis not present

## 2020-12-29 DIAGNOSIS — F0390 Unspecified dementia without behavioral disturbance: Secondary | ICD-10-CM | POA: Diagnosis not present

## 2020-12-29 DIAGNOSIS — E785 Hyperlipidemia, unspecified: Secondary | ICD-10-CM

## 2020-12-29 DIAGNOSIS — R351 Nocturia: Secondary | ICD-10-CM | POA: Diagnosis not present

## 2020-12-29 DIAGNOSIS — I10 Essential (primary) hypertension: Secondary | ICD-10-CM

## 2020-12-29 DIAGNOSIS — R002 Palpitations: Secondary | ICD-10-CM | POA: Diagnosis not present

## 2020-12-29 DIAGNOSIS — F32A Depression, unspecified: Secondary | ICD-10-CM | POA: Diagnosis not present

## 2020-12-29 DIAGNOSIS — R3581 Nocturnal polyuria: Secondary | ICD-10-CM | POA: Diagnosis not present

## 2020-12-29 DIAGNOSIS — N3010 Interstitial cystitis (chronic) without hematuria: Secondary | ICD-10-CM | POA: Diagnosis not present

## 2020-12-29 DIAGNOSIS — E782 Mixed hyperlipidemia: Secondary | ICD-10-CM | POA: Diagnosis not present

## 2020-12-29 DIAGNOSIS — R350 Frequency of micturition: Secondary | ICD-10-CM | POA: Diagnosis not present

## 2020-12-29 DIAGNOSIS — R3915 Urgency of urination: Secondary | ICD-10-CM | POA: Diagnosis not present

## 2020-12-29 NOTE — Patient Instructions (Signed)
  Follow-Up: At [facility], you and your health needs are our priority.  As part of our continuing mission to provide you with exceptional heart care, we have created designated Provider Care Teams.  These Care Teams include your primary Cardiologist (physician) and Advanced Practice Providers (APPs -  Physician Assistants and Nurse Practitioners) who all work together to provide you with the care you need, when you need it.  We recommend signing up for the patient portal called "MyChart".  Sign up information is provided on this After Visit Summary.  MyChart is used to connect with patients for Virtual Visits (Telemedicine).  Patients are able to view lab/test results, encounter notes, upcoming appointments, etc.  Non-urgent messages can be sent to your provider as well.   To learn more about what you can do with MyChart, go to [email or web address].    Your next appointment:   3 month(s)  The format for your next appointment:   In Person  Provider:   Brian Crenshaw, MD    

## 2020-12-29 NOTE — Progress Notes (Signed)
HPI: FU nonischemic cardiomyopathy. Patient had an echocardiogram in 2012 ejection fraction of 45-50%. Reduced LV function felt secondary to chemotherapy (herceptin). Cardiac catheterization November 2015 showed no obstructive coronary disease. Ejection fraction 50-55%. Carotid Dopplers January 2018 showed 1-39% bilateral stenosis.  Echo repeated December 2021 showed normal LV function, grade 1 diastolic dysfunction.  Monitor September 2022 showed normal sinus rhythm with short runs of SVT up to 29.5 seconds, rare PVC, PAC and ventricular couplet.  Seen recently with palpitations and Cardizem was increased.  Since last seen, she continues to have palpitations particularly at night; note she has recently been diagnosed with dementia with significant anxiety regarding this. No dyspnea or CP.   Current Outpatient Medications  Medication Sig Dispense Refill   acetaminophen (TYLENOL) 500 MG tablet Take 1-2 tablets (500-1,000 mg total) by mouth every 8 (eight) hours as needed for moderate pain. 100 tablet 2   albuterol (VENTOLIN HFA) 108 (90 Base) MCG/ACT inhaler Inhale 1-2 puffs into the lungs every 6 (six) hours as needed for wheezing or shortness of breath. 18 g 3   AMBULATORY NON FORMULARY MEDICATION Nitroglycerin 0.125% gel applied topically to the rectum TID x 6-8 weeks 1 each 1   Calcium Carbonate (CALCIUM 600 PO) Take 1 tablet by mouth in the morning and at bedtime.     carvedilol (COREG) 25 MG tablet TAKE 1 TABLET TWICE DAILY WITH A MEAL 180 tablet 1   cetirizine (ZYRTEC) 10 MG tablet Take 10 mg by mouth daily.     diltiazem (CARDIZEM CD) 120 MG 24 hr capsule Take 1 capsule (120 mg total) by mouth daily. 30 capsule 3   docusate sodium (COLACE) 250 MG capsule Take 250 mg by mouth daily as needed for constipation.     donepezil (ARICEPT) 10 MG tablet TAKE 1 TABLET (10 MG TOTAL) BY MOUTH AT BEDTIME. 90 tablet 0   ergocalciferol (VITAMIN D2) 1.25 MG (50000 UT) capsule Take 2,000 Units by mouth  daily.     famotidine (PEPCID) 20 MG tablet TAKE 1 TABLET BY MOUTH TWICE A DAY 180 tablet 1   ferrous sulfate 325 (65 FE) MG tablet Take 325 mg by mouth daily with breakfast.     folic acid (FOLVITE) 1 MG tablet Take 1 tablet (1 mg total) by mouth daily. 90 tablet 0   gabapentin (NEURONTIN) 300 MG capsule Take 1 capsule (300 mg total) by mouth 2 (two) times daily as needed. 180 capsule 0   hydrOXYzine (ATARAX/VISTARIL) 25 MG tablet Take 0.5-1 tablets (12.5-25 mg total) by mouth 2 (two) times daily as needed. 60 tablet 1   Melatonin 10 MG CAPS Take 1 capsule by mouth at bedtime.     Multiple Vitamin (MULTIVITAMIN WITH MINERALS) TABS tablet Take 1 tablet by mouth daily.     omeprazole (PRILOSEC) 20 MG capsule Take 1 capsule (20 mg total) by mouth daily. 30 capsule 3   pentosan polysulfate (ELMIRON) 100 MG capsule Take 100 mg by mouth as directed. One at morning and two at night     rosuvastatin (CRESTOR) 20 MG tablet TAKE 1 TABLET AT BEDTIME 90 tablet 3   telmisartan (MICARDIS) 40 MG tablet Take 1 tablet (40 mg total) by mouth daily.     venlafaxine XR (EFFEXOR-XR) 75 MG 24 hr capsule Take 3 capsules (225 mg total) by mouth daily with breakfast. 270 capsule 1   methocarbamol (ROBAXIN) 500 MG tablet Take 1 tablet (500 mg total) by mouth every 6 (six) hours as  needed for muscle spasms. (Patient not taking: Reported on 12/29/2020) 40 tablet 0   No current facility-administered medications for this visit.     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 Echo 2012: EF 45-50%;  b. Lex MV 2/12:  low risk, apical defect (small area of ischemia vs shifting breast atten);  c.  Echo 7/12: Normal wall thickness, EF 60-65%, normal wall motion, grade 1 diastolic dysfunction, mild LAE, PASP 32;   d. Lex MV 11/13:  EF 76%, no ischemia   OA (osteoarthritis) 09/07/2011   Obesity 09/28/2014   Osteoporosis 03/07/2011   DEXA T score -2.6 AP spine 03/07/11    Formatting of this note might be different from the original. Formatting of this note might be different from the original. DEXA T score -2.6 AP spine 03/07/11   Last Assessment & Plan:  Formatting of this note might be different from the original. Encouraged to get adequate exercise, calcium and vitamin d intake   Pain in joint, ankle and foot 09/28/2014   Palpitation 10/28/2020   Personal history of chemotherapy 2012   Personal history of radiation therapy 2012   Prediabetes 06/15/2009   Recurrent falls 12/28/2015   Right knee pain 07/10/2020   Shortness of breath    UTI (urinary tract infection) 03/03/2012   Vertebral fracture, osteoporotic, sequela  04/05/2016   Vitamin D deficiency 06/14/2017   Supplement and monitor    Past Surgical History:  Procedure Laterality Date   BREAST LUMPECTOMY  04/2009   RIGHT FOR BREAST CANCER-CHEMO/RADIATION X 1 YEAR   BREAST REDUCTION SURGERY Bilateral 05/04/2014   Procedure: MAMMARY REDUCTION  (BREAST);  Surgeon: Cristine Polio, MD;  Location: [facility];  Service: Plastics;  Laterality: Bilateral;   CARPAL TUNNEL RELEASE     L wrist, ulnar nerve moved   CHOLECYSTECTOMY     CRANIOTOMY FOR TUMOR  2000   ELBOW SURGERY     left   KNEE ARTHROSCOPY Left 10/12/2014   Procedure: LEFT KNEE ARTHROSCOPY ;  Surgeon: Gaynelle Arabian, MD;  Location: [facility];  Service: Orthopedics;  Laterality: Left;   KYPHOPLASTY N/A 03/30/2016   Procedure: THORACIC 12 KYPHOPLASTY;  Surgeon: Phylliss Bob, MD;  Location: [facility];  Service: Orthopedics;  Laterality: N/A;  THORACIC 12 KYPHOPLASTY   LEFT HEART CATHETERIZATION WITH CORONARY ANGIOGRAM N/A 12/16/2013   Procedure: LEFT HEART CATHETERIZATION WITH CORONARY ANGIOGRAM;  Surgeon: Peter M [name], MD;  Location: [facility];  Service: Cardiovascular;  Laterality: N/A;   LIPOMA EXCISION  03/28/2009   right leg   MELANOMA EXCISION     PORT-A-CATH REMOVAL  11/30/2010   Streck   porta cath     PORTACATH PLACEMENT  may 2011   REDUCTION MAMMAPLASTY Bilateral    SYNOVECTOMY Left 10/12/2014   Procedure: WITH SYNOVECTOMY;  Surgeon: Gaynelle Arabian, MD;  Location: [facility];  Service: Orthopedics;  Laterality: Left;   TONSILLECTOMY  1958   TOTAL KNEE ARTHROPLASTY  02/05/2012   Procedure: TOTAL KNEE ARTHROPLASTY;  Surgeon: Gearlean Alf, MD;  Location: [facility];  Service: Orthopedics;  Laterality: Right;   TOTAL KNEE ARTHROPLASTY Left 02/10/2013   Procedure: LEFT TOTAL KNEE ARTHROPLASTY;  Surgeon: Gearlean Alf, MD;  Location: [facility];  Service: Orthopedics;  Laterality: Left;   total knee raplacement  01-2012   Right Knee   TOTAL KNEE REVISION Left 07/14/2020   Procedure:  TOTAL KNEE REVISION;  Surgeon: Gaynelle Arabian, MD;  Location: [facility];  Service: Orthopedics;  Laterality: Left;   TUBAL LIGATION  1997    Social History   Socioeconomic History   Marital status: Married    Spouse name: Deidre Ala   Number of children: 3   Years of education: 12   Highest education level: High school graduate  Occupational History   Occupation: boutique owner-retired    Employer: [employer]  Tobacco Use   Smoking status: Never   Smokeless tobacco: Never  Vaping Use   Vaping Use: Never used  Substance and Sexual Activity   Alcohol use: No   Drug use: No   Sexual activity: Not Currently  Other Topics Concern   Not on file  Social History Narrative   Patient is right-handed. She lives with her husband in a 4 story home. They take care of their grown son with down syndrome who has had a stroke. She drinks 1-2 glasses of tea in the evenings. She does not formally exercise.      Social Determinants of Health   Financial Resource Strain: Medium Risk   Difficulty of Paying Living Expenses: Somewhat hard  Food Insecurity: No Food Insecurity   Worried About Charity fundraiser in the Last Year: Never true   Ran Out of Food in the Last Year: Never true  Transportation Needs: No Transportation Needs   Lack of Transportation (Medical): No   Lack of Transportation (Non-Medical): No  Physical Activity: Not on file  Stress: Not on file  Social Connections: Not on file  Intimate Partner Violence: Not on file    Family History  Problem Relation Age of Onset   Breast cancer Mother        sarcoma   Lung cancer Mother    Hypertension Mother    Prostate cancer Father    Congestive Heart Failure Father    Heart attack Father    Prostate cancer Brother    Heart disease Maternal Grandfather        MI   Down syndrome Son    CVA Son    Stomach cancer Maternal Aunt    Dementia  Maternal Aunt    Uterine cancer Maternal Aunt    Colon cancer Neg Hx     ROS: no fevers  or chills, productive cough, hemoptysis, dysphasia, odynophagia, melena, hematochezia, dysuria, hematuria, rash, seizure activity, orthopnea, PND, pedal edema, claudication. Remaining systems are negative.  Physical Exam: Well-developed well-nourished in no acute distress.  Skin is warm and dry.  HEENT is normal.  Neck is supple.  Chest is clear to auscultation with normal expansion.  Cardiovascular exam is regular rate and rhythm.  Abdominal exam nontender or distended. No masses palpated. Extremities show no edema. neuro grossly intact  A/P  1 nonischemic cardiomyopathy-LV function has improved on most recent echocardiogram.  Continue carvedilol.  2 palpitations-she continues to have palpitations; these occur more frequently at night and last 30 min at a time; she has significant anxiety about recent diagnosis of early dementia and I wonder whether this may be contributing. She will purchase alivecor and forward strips at time of symptoms. If SVT demonstrated, may need to consider ablation. Continue coreg and cardizem.  3 hypertension-patient's blood pressure is controlled.  Continue present medical regimen.  4 chronic diastolic congestive heart failure-she is euvolemic.  We will continue fluid restriction and low-sodium diet.  5 hyperlipidemia-continue statin.  Kirk Ruths, MD

## 2020-12-30 ENCOUNTER — Ambulatory Visit (INDEPENDENT_AMBULATORY_CARE_PROVIDER_SITE_OTHER): Payer: Medicare Other | Admitting: *Deleted

## 2020-12-30 DIAGNOSIS — F0670 Mild neurocognitive disorder due to known physiological condition without behavioral disturbance: Secondary | ICD-10-CM

## 2020-12-30 DIAGNOSIS — I10 Essential (primary) hypertension: Secondary | ICD-10-CM

## 2020-12-30 DIAGNOSIS — M810 Age-related osteoporosis without current pathological fracture: Secondary | ICD-10-CM

## 2020-12-30 DIAGNOSIS — D444 Neoplasm of uncertain behavior of craniopharyngeal duct: Secondary | ICD-10-CM

## 2020-12-30 DIAGNOSIS — E782 Mixed hyperlipidemia: Secondary | ICD-10-CM

## 2020-12-30 DIAGNOSIS — F418 Other specified anxiety disorders: Secondary | ICD-10-CM

## 2020-12-30 DIAGNOSIS — E669 Obesity, unspecified: Secondary | ICD-10-CM

## 2020-12-30 DIAGNOSIS — N3010 Interstitial cystitis (chronic) without hematuria: Secondary | ICD-10-CM

## 2020-12-30 DIAGNOSIS — F339 Major depressive disorder, recurrent, unspecified: Secondary | ICD-10-CM

## 2020-12-30 DIAGNOSIS — R296 Repeated falls: Secondary | ICD-10-CM

## 2020-12-30 NOTE — Chronic Care Management (AMB) (Signed)
Chronic Care Management    Clinical Social Work Note  12/30/2020 Name: Shannon [name] MRN: [id number] DOB: [birth year]  Shannon [name] is a 75 y.o. year old female who is a primary care patient of Mosie Lukes, MD. The CCM team was consulted to assist the patient with chronic disease management and/or care coordination needs related to: Appointment Scheduling Needs, Community Resources, Mental Health Counseling and Resources, and Grief Counseling.   Engaged with patient by telephone for initial visit in response to provider referral for social work chronic care management and care coordination services.   Consent to Services:  The patient was given information about Chronic Care Management services, agreed to services, and gave verbal consent prior to initiation of services.  Please see initial visit note for detailed documentation.   Patient agreed to services and consent obtained.   Assessment: Review of patient past medical history, allergies, medications, and health status, including review of relevant consultants reports was performed today as part of a comprehensive evaluation and provision of chronic care management and care coordination services.     SDOH (Social Determinants of Health) assessments and interventions performed:  SDOH Interventions    Flowsheet Row Most Recent Value  SDOH Interventions   Food Insecurity Interventions Intervention Not Indicated  Financial Strain Interventions Intervention Not Indicated  Housing Interventions Intervention Not Indicated  Intimate Partner Violence Interventions Intervention Not Indicated  Physical Activity Interventions Intervention Not Indicated  Stress Interventions Provide Counseling  Social Connections Interventions Intervention Not Indicated  Transportation Interventions Intervention Not Indicated  Depression Interventions/Treatment  Referral to Psychiatry, Counseling, Currently on Treatment, Medication        Advanced  Directives Status: See Care Plan for related entries.  CCM Care Plan  Allergies  Allergen Reactions   Dilaudid [Hydromorphone Hcl] Itching    Outpatient Encounter Medications as of 12/30/2020  Medication Sig   acetaminophen (TYLENOL) 500 MG tablet Take 1-2 tablets (500-1,000 mg total) by mouth every 8 (eight) hours as needed for moderate pain.   albuterol (VENTOLIN HFA) 108 (90 Base) MCG/ACT inhaler Inhale 1-2 puffs into the lungs every 6 (six) hours as needed for wheezing or shortness of breath.   AMBULATORY NON FORMULARY MEDICATION Nitroglycerin 0.125% gel applied topically to the rectum TID x 6-8 weeks   Calcium Carbonate (CALCIUM 600 PO) Take 1 tablet by mouth in the morning and at bedtime.   carvedilol (COREG) 25 MG tablet TAKE 1 TABLET TWICE DAILY WITH A MEAL   cetirizine (ZYRTEC) 10 MG tablet Take 10 mg by mouth daily.   diltiazem (CARDIZEM CD) 120 MG 24 hr capsule Take 1 capsule (120 mg total) by mouth daily.   docusate sodium (COLACE) 250 MG capsule Take 250 mg by mouth daily as needed for constipation.   donepezil (ARICEPT) 10 MG tablet TAKE 1 TABLET (10 MG TOTAL) BY MOUTH AT BEDTIME.   ergocalciferol (VITAMIN D2) 1.25 MG (50000 UT) capsule Take 2,000 Units by mouth daily.   famotidine (PEPCID) 20 MG tablet TAKE 1 TABLET BY MOUTH TWICE A DAY   ferrous sulfate 325 (65 FE) MG tablet Take 325 mg by mouth daily with breakfast.   folic acid (FOLVITE) 1 MG tablet Take 1 tablet (1 mg total) by mouth daily.   gabapentin (NEURONTIN) 300 MG capsule Take 1 capsule (300 mg total) by mouth 2 (two) times daily as needed.   hydrOXYzine (ATARAX/VISTARIL) 25 MG tablet Take 0.5-1 tablets (12.5-25 mg total) by mouth 2 (two) times daily as needed.  Melatonin 10 MG CAPS Take 1 capsule by mouth at bedtime.   methocarbamol (ROBAXIN) 500 MG tablet Take 1 tablet (500 mg total) by mouth every 6 (six) hours as needed for muscle spasms. (Patient not taking: Reported on 12/29/2020)   Multiple Vitamin  (MULTIVITAMIN WITH MINERALS) TABS tablet Take 1 tablet by mouth daily.   omeprazole (PRILOSEC) 20 MG capsule Take 1 capsule (20 mg total) by mouth daily.   pentosan polysulfate (ELMIRON) 100 MG capsule Take 100 mg by mouth as directed. One at morning and two at night   rosuvastatin (CRESTOR) 20 MG tablet TAKE 1 TABLET AT BEDTIME   telmisartan (MICARDIS) 40 MG tablet Take 1 tablet (40 mg total) by mouth daily.   venlafaxine XR (EFFEXOR-XR) 75 MG 24 hr capsule Take 3 capsules (225 mg total) by mouth daily with breakfast.   No facility-administered encounter medications on file as of 12/30/2020.    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   Right knee pain 07/10/2020   Urinary frequency 02/11/2020   Debility 12/23/2017   Left knee pain 12/23/2017   Vitamin D deficiency 06/14/2017   Vertebral fracture, osteoporotic 04/05/2016   Left-sided back pain 03/06/2016   Chest pain 02/27/2016   Syncope 02/27/2016   Cataracts, bilateral 12/28/2015   Recurrent falls 12/28/2015   Family history- stomach cancer 09/08/2015   Family history of cancer 09/08/2015   [name] lesion 06/21/2015   Obesity 09/28/2014   Interstitial cystitis 09/28/2014   Pain in joint, ankle and foot 09/28/2014   Abnormal nuclear stress test 12/16/2013   Sternal pain 11/06/2013   Postoperative anemia due to acute blood loss 02/13/2013   Congestive dilated cardiomyopathy ([place]) 12/30/2012   Breast cancer of lower-inner quadrant of right female breast 10/21/2012   Constipation 03/03/2012   OA (osteoarthritis) 09/07/2011   Dermatitis    Hyponatremia 08/28/2011   Osteoporosis 03/07/2011   Prediabetes 06/15/2009   Lipoma 02/10/2009   Dyspnea on exertion 02/10/2009   Hyperlipidemia, mixed 08/11/2008   Major depressive disorder with  anxious distress 08/11/2008   Essential hypertension 08/11/2008   GERD (gastroesophageal reflux disease) 08/11/2008   Craniopharyngioma 2000    Conditions to be addressed/monitored: Anxiety, Depression, and Dementia.  Mental Health Concerns, Social Isolation, Limited Access to Caregiver, Cognitive Deficits, Memory Deficits, and Lacks Knowledge of [employer].  Care Plan : LCSW Plan of Care  Updates made by Francis Gaines, LCSW since 12/30/2020 12:00 AM     Problem: Reduce and Manage My Symptoms of Anxiety and Depression.   Priority: High     Goal: Reduce and Manage My Symptoms of Anxiety and Depression.   Start Date: 12/30/2020  Expected End Date: 03/30/2021  This Visit's Progress: On track  Priority: High  Note:   Current Barriers:   Acute Mental Health needs related to Debility, Obesity, Major Depressive Disorder with Anxious Distress, and Mild Neurocognitive Disorder due to Alzheimer's Disease, requires Support, Education, Resources, Referrals, Advocacy, and Care Coordination, in order to meet unmet mental health needs. Clinical Goal(s):  Patient will work with LCSW to reduce and manage symptoms of Anxiety and Depression, until re-established with a community mental health provider.     Patient will increase knowledge and/or ability of:        Coping Skills, Healthy Habits, Self-Management Skills, Stress Reduction, Home Safety  and Utilizing [employer] and Resources.   Clinical Interventions:  Assessed patient's previous treatment, needs, coping skills, current treatment, support system and barriers to care. PHQ-2 and PHQ-9 Depression Screening Tool performed and results reviewed with patient. Mindfulness Meditation Strategies, Relaxation Techniques and Deep Breathing Exercises taught and encouraged, daily. Solution-Focused Therapy performed, Verbalization of Feelings encouraged, Emotional Support provided, Problem Solving Solutions developed, Brief Cognitive  Behavioral Therapy initiated, Quality of Sleep assessed and Sleep Hygiene Techniques promoted, Psychotropic Medication Regimen reviewed and Compliance indicated, Support Group Participation encouraged and Increase Level of Activity/Exercise emphasized.    Discussed plans with patient for ongoing care management follow-up and provided patient with direct contact information for care management team. Discussed several options for long-term counseling based on need and insurance through [employer] and [employer].     Collaboration with Primary Care Physician, Dr. Penni Homans regarding development and update of comprehensive plan of care as evidenced by provider attestation and co-signature. List of Grief and Loss Support Groups provided, both on-line and in-person. Inter-disciplinary care team collaboration (see longitudinal plan of care). Patient Goals/Self-Care Activities: Begin personal counseling with LCSW, on a bi-weekly basis, to reduce and manage symptoms of Anxiety and Depression, until re-established with Clint Bolder, LCSW with [facility] [phone]).   LCSW collaboration with Clint Bolder, LCSW with [facility], to schedule your follow-up appointment, in an effort to re-establish services. Incorporate into daily practice - relaxation techniques, deep breathing exercises and mindfulness meditation strategies. Consider self-enrollment in a Grief and Loss Support Group, from the list provided. Consider developing a list of questions that you would like addressed by Dr. Metta Clines, Neurologist with [facility] [phone]# [phone]), during your next follow-up visit, and LCSW will submit to Dr. Tomi Likens for review, just prior to your appointment, scheduled for 01/10/2021 at 1:10 pm.  Contact LCSW directly (# [phone]) if you have questions, need assistance, or if additional social work needs are identified between now and our next scheduled telephone  outreach call.  Follow-Up:  01/11/2021 at 1:15 pm      [place] Clinical Social Worker [place] [phone]

## 2020-12-30 NOTE — Patient Instructions (Signed)
Visit Information   Thank you for taking time to visit with me today. Please don't hesitate to contact me if I can be of assistance to you before our next scheduled telephone appointment.  Following are the goals we discussed today:   Patient Goals/Self-Care Activities: Begin personal counseling with LCSW, on a bi-weekly basis, to reduce and manage symptoms of Anxiety and Depression, until re-established with Clint Bolder, LCSW with [place] [phone]).   LCSW collaboration with Clint Bolder, LCSW with [place], to schedule your follow-up appointment, in an effort to re-establish services. Incorporate into daily practice - relaxation techniques, deep breathing exercises and mindfulness meditation strategies. Consider self-enrollment in a Grief and Loss Support Group, from the list provided. Consider developing a list of questions that you would like addressed by Dr. Metta Clines, Neurologist with [facility] [phone]# [phone]), during your next follow-up visit, and LCSW will submit to Dr. Tomi Likens for review, just prior to your appointment, scheduled for 01/10/2021 at 1:10 pm.  Contact LCSW directly (# [phone]) if you have questions, need assistance, or if additional social work needs are identified between now and our next scheduled telephone outreach call.   Our next appointment is by telephone on 01/11/2021 at 1:15 pm.  Please call the care guide team at [phone] if you need to cancel or reschedule your appointment.   If you are experiencing a Mental Health or [place] or need someone to talk to, please call the Suicide and Crisis Lifeline: 988 call the [place] National Suicide Prevention Lifeline: 438-107-2587 or TTY: (667)131-0433 TTY 210-404-8982) to talk to a trained counselor call 1-800-273-TALK (toll free, 24 hour hotline) go to [facility] [address], [place]  [phone]) call the [facility]: [phone] call 911   Following is a copy of your full care plan:  Care Plan : LCSW Plan of Care  Updates made by Francis Gaines, LCSW since 12/30/2020 12:00 AM     Problem: Reduce and Manage My Symptoms of Anxiety and Depression.   Priority: High     Goal: Reduce and Manage My Symptoms of Anxiety and Depression.   Start Date: 12/30/2020  Expected End Date: 03/30/2021  This Visit's Progress: On track  Priority: High  Note:   Current Barriers:   Acute Mental Health needs related to Debility, Obesity, Major Depressive Disorder with Anxious Distress, and Mild Neurocognitive Disorder due to Alzheimer's Disease, requires Support, Education, Resources, Referrals, Advocacy, and Care Coordination, in order to meet unmet mental health needs. Clinical Goal(s):  Patient will work with LCSW to reduce and manage symptoms of Anxiety and Depression, until re-established with a community mental health provider.     Patient will increase knowledge and/or ability of:        Coping Skills, Healthy Habits, Self-Management Skills, Stress Reduction, Home Safety and Utilizing [employer] and Resources.   Clinical Interventions:  Assessed patient's previous treatment, needs, coping skills, current treatment, support system and barriers to care. PHQ-2 and PHQ-9 Depression Screening Tool performed and results reviewed with patient. Mindfulness Meditation Strategies, Relaxation Techniques and Deep Breathing Exercises taught and encouraged, daily. Solution-Focused Therapy performed, Verbalization of Feelings encouraged, Emotional Support provided, Problem Solving Solutions developed, Brief Cognitive Behavioral Therapy initiated, Quality of Sleep assessed and Sleep Hygiene Techniques promoted, Psychotropic Medication Regimen reviewed and Compliance indicated, Support Group Participation encouraged and Increase Level of Activity/Exercise emphasized.     Discussed plans with patient for ongoing care management follow-up  and provided patient with direct contact information for care management team. Discussed several options for long-term counseling based on need and insurance through [employer] and [employer].     Collaboration with Primary Care Physician, Dr. Penni Homans regarding development and update of comprehensive plan of care as evidenced by provider attestation and co-signature. List of Grief and Loss Support Groups provided, both on-line and in-person. Inter-disciplinary care team collaboration (see longitudinal plan of care). Patient Goals/Self-Care Activities: Begin personal counseling with LCSW, on a bi-weekly basis, to reduce and manage symptoms of Anxiety and Depression, until re-established with Clint Bolder, LCSW with [place] [phone]).   LCSW collaboration with Clint Bolder, LCSW with [place], to schedule your follow-up appointment, in an effort to re-establish services. Incorporate into daily practice - relaxation techniques, deep breathing exercises and mindfulness meditation strategies. Consider self-enrollment in a Grief and Loss Support Group, from the list provided. Consider developing a list of questions that you would like addressed by Dr. Metta Clines, Neurologist with [facility] [phone]# [phone]), during your next follow-up visit, and LCSW will submit to Dr. Tomi Likens for review, just prior to your appointment, scheduled for 01/10/2021 at 1:10 pm.  Contact LCSW directly (# [phone]) if you have questions, need assistance, or if additional social work needs are identified between now and our next scheduled telephone outreach call.  Follow-Up:  01/11/2021 at 1:15 pm        Consent to CCM Services: Ms. Kirshner was given information about Chronic Care Management services including:  CCM service includes personalized support from designated clinical staff  supervised by her physician, including individualized plan of care and coordination with other care providers 24/7 contact phone numbers for assistance for urgent and routine care needs. Service will only be billed when office clinical staff spend 20 minutes or more in a month to coordinate care. Only one practitioner may furnish and bill the service in a calendar month. The patient may stop CCM services at any time (effective at the end of the month) by phone call to the office staff. The patient will be responsible for cost sharing (co-pay) of up to 20% of the service fee (after annual deductible is met).  Patient agreed to services and verbal consent obtained.   Patient verbalizes understanding of instructions provided today and agrees to view in [place].   Telephone follow up appointment with care management team member scheduled for:  01/11/2021 at 1:15 pm.  Nat Christen Jarrell Licensed Clinical Social Worker [place] [phone]

## 2021-01-04 ENCOUNTER — Other Ambulatory Visit: Payer: Medicare Other

## 2021-01-09 ENCOUNTER — Encounter (HOSPITAL_BASED_OUTPATIENT_CLINIC_OR_DEPARTMENT_OTHER): Payer: Self-pay | Admitting: Cardiovascular Disease

## 2021-01-09 NOTE — Procedures (Signed)
Patient Name: Shannon [name], Shannon [name] Date: 12/15/2020 Gender: Female D.O.B: 15-Nov-1945 Age (years): 83 Referring Provider: Cherlynn Kaiser MD Height (inches): 61 Interpreting Physician: Shelva Majestic MD, ABSM Weight (lbs): 165 RPSGT: Carolin Coy BMI: 31 MRN: [id number] Neck Size: 15.00  CLINICAL INFORMATION Sleep Study Type: NPSG  Indication for sleep study: Fatigue, Hypertension, Obesity, Snoring  Epworth Sleepiness Score: 9  SLEEP STUDY TECHNIQUE As per the AASM Manual for the Scoring of Sleep and Associated Events v2.3 (April 2016) with a hypopnea requiring 4% desaturations.  The channels recorded and monitored were frontal, central and occipital EEG, electrooculogram (EOG), submentalis EMG (chin), nasal and oral airflow, thoracic and abdominal wall motion, anterior tibialis EMG, snore microphone, electrocardiogram, and pulse oximetry.  MEDICATIONS acetaminophen (TYLENOL) 500 MG tablet albuterol (VENTOLIN HFA) 108 (90 Base) MCG/ACT inhaler AMBULATORY NON FORMULARY MEDICATION Calcium Carbonate (CALCIUM 600 PO) carvedilol (COREG) 25 MG tablet cetirizine (ZYRTEC) 10 MG tablet diltiazem (CARDIZEM CD) 120 MG 24 hr capsule docusate sodium (COLACE) 250 MG capsule donepezil (ARICEPT) 10 MG tablet ergocalciferol (VITAMIN D2) 1.25 MG (50000 UT) capsule famotidine (PEPCID) 20 MG tablet ferrous sulfate 325 (65 FE) MG tablet folic acid (FOLVITE) 1 MG tablet gabapentin (NEURONTIN) 300 MG capsule hydrOXYzine (ATARAX/VISTARIL) 25 MG tablet Melatonin 10 MG CAPS methocarbamol (ROBAXIN) 500 MG tablet Multiple Vitamin (MULTIVITAMIN WITH MINERALS) TABS tablet omeprazole (PRILOSEC) 20 MG capsule pentosan polysulfate (ELMIRON) 100 MG capsule rosuvastatin (CRESTOR) 20 MG tablet telmisartan (MICARDIS) 40 MG tablet venlafaxine XR (EFFEXOR-XR) 75 MG 24 hr capsule  Medications self-administered by patient taken the night of the study : [place] The study  was initiated at 10:09:26 PM and ended at 4:37:29 AM.  Sleep onset time was 95.4 minutes and the sleep efficiency was 33.1%%. The total sleep time was 128.5 minutes.  Stage REM latency was N/A minutes.  The patient spent 48.2%% of the night in stage N1 sleep, 51.8%% in stage N2 sleep, 0.0%% in stage N3 and 0% in REM.  Alpha intrusion was absent.  Supine sleep was 100.00%.  RESPIRATORY PARAMETERS The overall apnea/hypopnea index (AHI) was 5.6 per hour. The respiratory disturbance index (RDI) was 45.8/h. There were 0 total apneas, including 0 obstructive, 0 central and 0 mixed apneas. There were 12 hypopneas and 86 RERAs.  The AHI during Stage REM sleep was N/A per hour.  AHI while supine was 5.6 per hour.  The mean oxygen saturation was 94.4%. The minimum SpO2 during sleep was 91.0%.  Snoring was noted during this study.  CARDIAC DATA The 2 lead EKG demonstrated sinus rhythm. The mean heart rate was 52.6 beats per minute. Other EKG findings include: None.  LEG MOVEMENT DATA The total PLMS were 0 with a resulting PLMS index of 0.0. Associated arousal with leg movement index was 3.7 .  IMPRESSIONS - Mild overall obstructive sleep apnea (AHI 5.6/h; with severe RDI at 45.8/h). REM sleep was absent. - The patient  no oxygen desaturation during the study (Min O2 91.0%) - No snoring was audible during this study. - Reduced sleep efficiency at only 33.1%. - Abnormal sleep architecture with absent slow wave and REM sleep. - No cardiac abnormalities were noted during this study. - Clinically significant periodic limb movements did not occur during sleep. No significant associated arousals.  DIAGNOSIS - Obstructive Sleep Apnea (G47.33) - Nocturnal Hypoxemia (G47.36)  RECOMMENDATIONS - Recommend an initial trial of CPAP Auto with EPR of 3 at 6 - 16 cm of water. Alternatives to CPAP therapy can  be considered such as a customized oral appliance.   - Effort should be made to optimize  nasal and oropharyngeal patency. - Positional therapy avoiding supine position during sleep. - Avoid alcohol, sedatives and other CNS depressants that may worsen sleep apnea and disrupt normal sleep architecture. - Sleep hygiene should be reviewed to assess factors that may improve sleep quality and duration with optimal sleep duration at 7 -9 hours.  - Weight management (BMI 31) and regular exercise should be initiated or continued if appropriate.  [Electronically signed] 01/09/2021 08:47 PM  Shelva Majestic MD, [facility], [place], American Board of Sleep Medicine   NPI: [id number] [place] PH: [phone]   FX: [phone] [place]

## 2021-01-10 ENCOUNTER — Ambulatory Visit: Payer: Medicare Other | Admitting: Neurology

## 2021-01-10 ENCOUNTER — Telehealth: Payer: Self-pay | Admitting: *Deleted

## 2021-01-10 NOTE — Telephone Encounter (Signed)
-----   Message from Troy Sine, MD sent at 01/09/2021  8:55 PM EST ----- Mariann Laster, please notify pt of results and set up for CPAP Auto; if pt is against CPAP then can consider customized oral appliance

## 2021-01-10 NOTE — Telephone Encounter (Signed)
Patient notified of sleep study results and recommendations. She requests to proceed with CPAP device versus using a oral appliance. Orders sent to [facility] for processing. Patient is aware of the CPAP back order.

## 2021-01-11 ENCOUNTER — Ambulatory Visit: Payer: Medicare Other | Admitting: *Deleted

## 2021-01-11 ENCOUNTER — Ambulatory Visit: Payer: Medicare Other | Admitting: General Practice

## 2021-01-11 DIAGNOSIS — F0670 Mild neurocognitive disorder due to known physiological condition without behavioral disturbance: Secondary | ICD-10-CM

## 2021-01-11 DIAGNOSIS — M810 Age-related osteoporosis without current pathological fracture: Secondary | ICD-10-CM

## 2021-01-11 DIAGNOSIS — N3010 Interstitial cystitis (chronic) without hematuria: Secondary | ICD-10-CM

## 2021-01-11 DIAGNOSIS — G309 Alzheimer's disease, unspecified: Secondary | ICD-10-CM

## 2021-01-11 DIAGNOSIS — E669 Obesity, unspecified: Secondary | ICD-10-CM

## 2021-01-11 DIAGNOSIS — E782 Mixed hyperlipidemia: Secondary | ICD-10-CM

## 2021-01-11 DIAGNOSIS — E871 Hypo-osmolality and hyponatremia: Secondary | ICD-10-CM

## 2021-01-11 DIAGNOSIS — I10 Essential (primary) hypertension: Secondary | ICD-10-CM

## 2021-01-11 DIAGNOSIS — R296 Repeated falls: Secondary | ICD-10-CM

## 2021-01-11 DIAGNOSIS — D444 Neoplasm of uncertain behavior of craniopharyngeal duct: Secondary | ICD-10-CM

## 2021-01-11 DIAGNOSIS — F339 Major depressive disorder, recurrent, unspecified: Secondary | ICD-10-CM

## 2021-01-11 DIAGNOSIS — R0609 Other forms of dyspnea: Secondary | ICD-10-CM

## 2021-01-11 NOTE — Chronic Care Management (AMB) (Signed)
Chronic Care Management    Clinical Social Work Note  01/11/2021 Name: Shannon [name] MRN: [id number] DOB: 07/12/1945  Shannon [name] is a 75 y.o. year old female who is a primary care patient of Mosie Lukes, MD. The CCM team was consulted to assist the patient with chronic disease management and/or care coordination needs related to: [employer], Mental Health Counseling and Resources, and Grief Counseling.   Engaged with patient by telephone for follow up visit in response to provider referral for social work chronic care management and care coordination services.   Consent to Services:  The patient was given information about Chronic Care Management services, agreed to services, and gave verbal consent prior to initiation of services.  Please see initial visit note for detailed documentation.   Patient agreed to services and consent obtained.   Assessment: Review of patient past medical history, allergies, medications, and health status, including review of relevant consultants reports was performed today as part of a comprehensive evaluation and provision of chronic care management and care coordination services.     SDOH (Social Determinants of Health) assessments and interventions performed:    Advanced Directives Status: Not addressed in this encounter.  CCM Care Plan  Allergies  Allergen Reactions   Dilaudid [Hydromorphone Hcl] Itching    Outpatient Encounter Medications as of 01/11/2021  Medication Sig   acetaminophen (TYLENOL) 500 MG tablet Take 1-2 tablets (500-1,000 mg total) by mouth every 8 (eight) hours as needed for moderate pain.   albuterol (VENTOLIN HFA) 108 (90 Base) MCG/ACT inhaler Inhale 1-2 puffs into the lungs every 6 (six) hours as needed for wheezing or shortness of breath.   AMBULATORY NON FORMULARY MEDICATION Nitroglycerin 0.125% gel applied topically to the rectum TID x 6-8 weeks   Calcium Carbonate (CALCIUM 600 PO) Take 1 tablet by mouth in the  morning and at bedtime.   carvedilol (COREG) 25 MG tablet TAKE 1 TABLET TWICE DAILY WITH A MEAL   cetirizine (ZYRTEC) 10 MG tablet Take 10 mg by mouth daily.   diltiazem (CARDIZEM CD) 120 MG 24 hr capsule Take 1 capsule (120 mg total) by mouth daily.   docusate sodium (COLACE) 250 MG capsule Take 250 mg by mouth daily as needed for constipation.   donepezil (ARICEPT) 10 MG tablet TAKE 1 TABLET (10 MG TOTAL) BY MOUTH AT BEDTIME.   ergocalciferol (VITAMIN D2) 1.25 MG (50000 UT) capsule Take 2,000 Units by mouth daily.   famotidine (PEPCID) 20 MG tablet TAKE 1 TABLET BY MOUTH TWICE A DAY   ferrous sulfate 325 (65 FE) MG tablet Take 325 mg by mouth daily with breakfast.   folic acid (FOLVITE) 1 MG tablet Take 1 tablet (1 mg total) by mouth daily.   gabapentin (NEURONTIN) 300 MG capsule Take 1 capsule (300 mg total) by mouth 2 (two) times daily as needed.   hydrOXYzine (ATARAX/VISTARIL) 25 MG tablet Take 0.5-1 tablets (12.5-25 mg total) by mouth 2 (two) times daily as needed.   Melatonin 10 MG CAPS Take 1 capsule by mouth at bedtime.   methocarbamol (ROBAXIN) 500 MG tablet Take 1 tablet (500 mg total) by mouth every 6 (six) hours as needed for muscle spasms. (Patient not taking: Reported on 12/29/2020)   Multiple Vitamin (MULTIVITAMIN WITH MINERALS) TABS tablet Take 1 tablet by mouth daily.   omeprazole (PRILOSEC) 20 MG capsule Take 1 capsule (20 mg total) by mouth daily.   pentosan polysulfate (ELMIRON) 100 MG capsule Take 100 mg by mouth as directed.  One at morning and two at night   rosuvastatin (CRESTOR) 20 MG tablet TAKE 1 TABLET AT BEDTIME   telmisartan (MICARDIS) 40 MG tablet Take 1 tablet (40 mg total) by mouth daily.   venlafaxine XR (EFFEXOR-XR) 75 MG 24 hr capsule Take 3 capsules (225 mg total) by mouth daily with breakfast.   No facility-administered encounter medications on file as of 01/11/2021.    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   Right knee pain 07/10/2020   Urinary frequency 02/11/2020   Debility 12/23/2017   Left knee pain 12/23/2017   Vitamin D deficiency 06/14/2017   Vertebral fracture, osteoporotic 04/05/2016   Left-sided back pain 03/06/2016   Chest pain 02/27/2016   Syncope 02/27/2016   Cataracts, bilateral 12/28/2015   Recurrent falls 12/28/2015   Family history- stomach cancer 09/08/2015   Family history of cancer 09/08/2015   [name] lesion 06/21/2015   Obesity 09/28/2014   Interstitial cystitis 09/28/2014   Pain in joint, ankle and foot 09/28/2014   Abnormal nuclear stress test 12/16/2013   Sternal pain 11/06/2013   Postoperative anemia due to acute blood loss 02/13/2013   Congestive dilated cardiomyopathy ([place]) 12/30/2012   Breast cancer of lower-inner quadrant of right female breast 10/21/2012   Constipation 03/03/2012   OA (osteoarthritis) 09/07/2011   Dermatitis    Hyponatremia 08/28/2011   Osteoporosis 03/07/2011   Prediabetes 06/15/2009   Lipoma 02/10/2009   Dyspnea on exertion 02/10/2009   Hyperlipidemia, mixed 08/11/2008   Major depressive disorder with anxious distress 08/11/2008   Essential hypertension 08/11/2008   GERD (gastroesophageal reflux disease) 08/11/2008   Craniopharyngioma 2000    Conditions to be addressed/monitored: Anxiety and Depression.  Mental Health Concerns, Social Isolation, Cognitive Deficits, Memory Deficits, and Lacks Knowledge of [employer].  Care Plan : LCSW Plan of Care  Updates made by Francis Gaines, LCSW since 01/11/2021 12:00 AM     Problem: Reduce and Manage My Symptoms of Anxiety and Depression.   Priority: High     Goal: Reduce and Manage My Symptoms of Anxiety and Depression.   Start Date: 12/30/2020  Expected End Date: 03/30/2021  This Visit's Progress: On track  Recent  Progress: On track  Priority: High  Note:   Current Barriers:   Acute Mental Health needs related to Debility, Obesity, Major Depressive Disorder with Anxious Distress, and Mild Neurocognitive Disorder due to Alzheimer's Disease, requires Support, Education, Resources, Referrals, Advocacy, and Care Coordination, in order to meet unmet mental health needs. Clinical Goal(s):  Patient will work with LCSW to reduce and manage symptoms of Anxiety and Depression, until re-established with a community mental health provider.     Patient will increase knowledge and/or ability of:        Coping Skills, Healthy Habits, Self-Management Skills, Stress Reduction, Home Safety and Utilizing [employer] and Resources.   Clinical Interventions:  Solution-Focused Therapy performed, Verbalization of Feelings encouraged, Emotional Support provided, Problem Solving Solutions developed, Brief Cognitive Behavioral Therapy initiated, Quality of Sleep assessed and Sleep Hygiene Techniques promoted, Psychotropic Medication Regimen reviewed and Compliance indicated, Support Group Participation encouraged and Increase Level of Activity/Exercise emphasized.    Collaboration with Primary Care Physician, Dr. Penni Homans regarding development and update of comprehensive plan of care as evidenced by provider attestation and co-signature. List of Dementia Support  Groups provided, both on-line and in-person. Inter-disciplinary care team collaboration (see longitudinal plan of care). Patient Goals/Self-Care Activities: Continue to receive personal counseling with LCSW, on a bi-weekly basis, to reduce and manage symptoms of Anxiety and Depression, until re-established with Clint Bolder, LCSW with [place] [phone]).   LCSW collaboration with Clint Bolder, LCSW with [place], to schedule your follow-up appointment, in an effort to re-establish services.  ~ HIPAA compliant messages  left on voicemail.  Awaiting a return call.  You are also encouraged to outreach to Clint Bolder to re-establish services. Try to incorporate into daily practice - relaxation techniques, deep breathing exercises and mindfulness meditation strategies. Consider self-enrollment in a Dementia Support Group, both on-line and in-person, from the list provided.   Empathy expressed after recent diagnosis of Dementia, confirmed by Dr. Metta Clines, Neurologist with [facility] (# [phone]), during your appointment on 01/10/2021 at 1:10 pm.  Contact LCSW directly (# [phone]) if you have questions, need assistance, or if additional social work needs are identified between now and our next scheduled telephone outreach call.  Follow-Up:  02/10/2021 at 10:00 am      [place] Clinical Social Worker [place] [phone]

## 2021-01-11 NOTE — Patient Instructions (Signed)
Visit Information  Thank you for taking time to visit with me today. Please don't hesitate to contact me if I can be of assistance to you before our next scheduled telephone appointment.  Following are the goals we discussed today:   Patient Goals/Self-Care Activities: Continue to receive personal counseling with LCSW, on a bi-weekly basis, to reduce and manage symptoms of Anxiety and Depression, until re-established with Clint Bolder, LCSW with [facility] [phone]).   LCSW collaboration with Clint Bolder, LCSW with [place], to schedule your follow-up appointment, in an effort to re-establish services.  ~ HIPAA compliant messages left on voicemail.  Awaiting a return call.  You are also encouraged to outreach to Clint Bolder to re-establish services. Try to incorporate into daily practice - relaxation techniques, deep breathing exercises and mindfulness meditation strategies. Consider self-enrollment in a Dementia Support Group, both on-line and in-person, from the list provided.   Empathy expressed after recent diagnosis of Dementia, confirmed by Dr. Metta Clines, Neurologist with [facility] (# [phone]), during your appointment on 01/10/2021 at 1:10 pm.  Contact LCSW directly (# [phone]) if you have questions, need assistance, or if additional social work needs are identified between now and our next scheduled telephone outreach call.   Follow-Up:  02/10/2021 at 10:00 am  Please call the care guide team at [phone] if you need to cancel or reschedule your appointment.   If you are experiencing a Mental Health or [place] or need someone to talk to, please call the Suicide and Crisis Lifeline: 988 call the [place] National Suicide Prevention Lifeline: 248-598-7344 or TTY: 934-032-9582 TTY (281) 174-0493) to talk to a trained counselor call 1-800-273-TALK (toll free, 24 hour hotline) go to [facility] [address] [place] [phone]) call the [place]: [phone] call 911   Patient verbalizes understanding of instructions provided today and agrees to view in [place].   [place] Licensed Clinical Social Worker [place] [phone]

## 2021-01-12 ENCOUNTER — Telehealth (INDEPENDENT_AMBULATORY_CARE_PROVIDER_SITE_OTHER): Payer: Medicare Other | Admitting: Family Medicine

## 2021-01-12 DIAGNOSIS — F0670 Mild neurocognitive disorder due to known physiological condition without behavioral disturbance: Secondary | ICD-10-CM | POA: Diagnosis not present

## 2021-01-12 DIAGNOSIS — R7303 Prediabetes: Secondary | ICD-10-CM

## 2021-01-12 DIAGNOSIS — E782 Mixed hyperlipidemia: Secondary | ICD-10-CM

## 2021-01-12 DIAGNOSIS — E559 Vitamin D deficiency, unspecified: Secondary | ICD-10-CM

## 2021-01-12 DIAGNOSIS — N3010 Interstitial cystitis (chronic) without hematuria: Secondary | ICD-10-CM

## 2021-01-12 DIAGNOSIS — G309 Alzheimer's disease, unspecified: Secondary | ICD-10-CM | POA: Diagnosis not present

## 2021-01-12 DIAGNOSIS — G4733 Obstructive sleep apnea (adult) (pediatric): Secondary | ICD-10-CM

## 2021-01-12 DIAGNOSIS — G4730 Sleep apnea, unspecified: Secondary | ICD-10-CM

## 2021-01-12 DIAGNOSIS — R002 Palpitations: Secondary | ICD-10-CM | POA: Diagnosis not present

## 2021-01-12 DIAGNOSIS — F418 Other specified anxiety disorders: Secondary | ICD-10-CM | POA: Insufficient documentation

## 2021-01-12 DIAGNOSIS — I10 Essential (primary) hypertension: Secondary | ICD-10-CM | POA: Diagnosis not present

## 2021-01-12 HISTORY — DX: Obstructive sleep apnea (adult) (pediatric): G47.33

## 2021-01-12 NOTE — Assessment & Plan Note (Signed)
Encourage heart healthy diet such as MIND or DASH diet, increase exercise, avoid trans fats, simple carbohydrates and processed foods, consider a krill or fish or flaxseed oil cap daily.  °

## 2021-01-12 NOTE — Assessment & Plan Note (Signed)
Is following with Dr Amalia Hailey urology at [facility] and is still notably symptomatic. Is on elmiron and Myrebetriq but the Myrebetriq is the new addition and not helping per patient. She added D Mannose also but she does not note an improvement. Continue all til cystoscopy in mid January

## 2021-01-12 NOTE — Assessment & Plan Note (Signed)
[id number] acceptable, minimize simple carbs. Increase exercise as tolerated.  

## 2021-01-12 NOTE — Assessment & Plan Note (Signed)
Well controlled, no changes to meds. Encouraged heart healthy diet such as the DASH diet and exercise as tolerated.  °

## 2021-01-12 NOTE — Assessment & Plan Note (Signed)
Is following with cardiology. Dr Stanford Breed, no new meds is doing better. No changes

## 2021-01-12 NOTE — Assessment & Plan Note (Signed)
Had a sleep study with cardiology that showed apnea and she is waiting for CPAP machine.

## 2021-01-12 NOTE — Assessment & Plan Note (Signed)
Has her neurology follow up after her neuropsychiatric testing tomorrow. She is very anxious and she is warned there were some findings and her family is going to accompany her so she can plan for the future with her family. Encouraged to learn something new and to eat a low inflammation diet and increase activity levels.

## 2021-01-12 NOTE — Assessment & Plan Note (Signed)
Supplement and monitor 

## 2021-01-12 NOTE — Progress Notes (Signed)
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 Use of CPAP Check ANA, sed rate, B12, SPEP/IFE, ACE NCV-EMG lower extremities Further recommendations pending results Follow up with Sharene Butters, PA-C in 6 months.  Subjective:  Shannon [name]. Shannon [name] is a 75 year old right-handed female with HTN, OA, neuropathy and history of craniopharyngioma s/p resection, breast cancer and melanoma who follows up for neurocognitive disorder  She is accompanied by her daughter and husband who also provides history.  UPDATE: Started donepezil in March (on 10mg  at bedtime) B12 level in March was >1506.    MRI of brain with and without contrast on 04/30/2020 personally reviewed showed chronic small vessel ischemic changes with right pterional craniotomy without residual or recurrent sellar mass  She underwent neuropsychological evaluation on 12/01/2020 which was consistent with mild neurocognitive disorder likely due to Alzheimer's disease.  About 3 months ago, she started experiencing burning and numbness in both upper and lower extremities from hands/feet to mid-forearm/mid-shin.  She notices mostly when sitting or laying bed, less so when she is up and on her feet.  No radicular pain or weakness.  It correlated with change in diltiazem but otherwise no medication changes.  She takes gabapentin for longstanding neuropathy related to chemotherapy, but this is different.  Labs from 3 months ago include Hgb A1c 6.1, TSH 3.11, vit D 45.39.    HISTORY: 2021 had been a difficult year emotionally for her.  She is caring for her husband with dementia.  Her son passed away.  She started having headaches in December 2021.   It followed cataract surgery, so she thought it was due to the eye drops but it persisted after stopping the drops.  They were severe daily headaches that moved around different locations on her head.  She has had some memory problems for a couple of years which has been noticeable to herself and family over the past year.  When talking about recent events, she will forget, such as where she had lunch or what she did that day.  She will repeat herself 5 to 10 times during the same conversation.  Her daughter started managing her medication because she would sometimes forget to take her medications despite using a pill box.  So far, she has been paying her bills without difficulty.  She drives but denies difficulty or disorientation.  One time, she thought that she was playing hide and seek with her granddaughter who was not there.  On 02/18/2020, she exhibited altered mental status.  She thought her mother who is long passed, was in the house cooking dinner. She went to the ED for further evaluation.  CT head personally reviewed unremarkable.  UA showed possible UTI and was prescribed Keflex.  CXR negative and labs negative for leukocytosis or electrolyte imbalance.  She was also dehydrated.  Followed up with primary care.  MRI of brain without contrast on 03/02/2020 personally reviewed showed mild cerebral atrophy with chronic small vessel ischemic changes and sequelae of prior right pterional craniotomy with no visible tumor recurrence but no acute abnormality.  Headaches now are infrequent, mild and lasting a couple of hours about 2 days a week.  TSH from January was 2.26.   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 Echo 2012: EF 45-50%;  b. Lex MV 2/12:  low risk, apical defect (small area of ischemia vs shifting breast atten);  c.  Echo 7/12: Normal wall thickness, EF 60-65%, normal wall motion, grade 1 diastolic dysfunction, mild LAE, PASP 32;   d. Lex MV 11/13:  EF 76%, no ischemia   OA (osteoarthritis) 09/07/2011   Obesity 09/28/2014   Osteoporosis 03/07/2011   DEXA T score -2.6 AP spine 03/07/11    Formatting of this note might be different from the original. Formatting of this note might be different from the original. DEXA T score -2.6 AP spine 03/07/11   Last Assessment & Plan:  Formatting of this note might be different from the original. Encouraged to get  adequate exercise, calcium and vitamin d intake   Pain in joint, ankle and foot 09/28/2014   Palpitation 10/28/2020   Personal history of chemotherapy 2012   Personal history of radiation therapy 2012   Prediabetes 06/15/2009   Recurrent falls 12/28/2015   Right knee pain 07/10/2020   Shortness of breath    UTI (urinary tract infection) 03/03/2012   Vertebral fracture, osteoporotic, sequela 04/05/2016   Vitamin D deficiency 06/14/2017   Supplement and monitor    MEDICATIONS: Current Outpatient Medications on File Prior to Visit  Medication Sig Dispense Refill   acetaminophen (TYLENOL) 500 MG tablet Take 1-2 tablets (500-1,000 mg total) by mouth every 8 (eight) hours as needed for moderate pain. 100 tablet 2   albuterol (VENTOLIN HFA) 108 (90 Base) MCG/ACT inhaler Inhale 1-2 puffs into the lungs every 6 (six) hours as needed for wheezing or shortness of breath. 18 g 3   AMBULATORY NON FORMULARY MEDICATION Nitroglycerin 0.125% gel applied topically to the rectum TID x 6-8 weeks 1 each 1   Calcium Carbonate (CALCIUM 600 PO) Take 1 tablet by mouth in the morning and at bedtime.     carvedilol (COREG)  25 MG tablet TAKE 1 TABLET TWICE DAILY WITH A MEAL 180 tablet 1   cetirizine (ZYRTEC) 10 MG tablet Take 10 mg by mouth daily.     diltiazem (CARDIZEM CD) 120 MG 24 hr capsule Take 1 capsule (120 mg total) by mouth daily. 30 capsule 3   docusate sodium (COLACE) 250 MG capsule Take 250 mg by mouth daily as needed for constipation.     donepezil (ARICEPT) 10 MG tablet TAKE 1 TABLET (10 MG TOTAL) BY MOUTH AT BEDTIME. 90 tablet 0   ergocalciferol (VITAMIN D2) 1.25 MG (50000 UT) capsule Take 2,000 Units by mouth daily. (Patient not taking: Reported on 01/12/2021)     famotidine (PEPCID) 20 MG tablet TAKE 1 TABLET BY MOUTH TWICE A DAY 180 tablet 1   ferrous sulfate 325 (65 FE) MG tablet Take 325 mg by mouth daily with breakfast.     folic acid (FOLVITE) 1 MG tablet Take 1 tablet (1 mg total) by mouth  daily. 90 tablet 0   gabapentin (NEURONTIN) 300 MG capsule Take 1 capsule (300 mg total) by mouth 2 (two) times daily as needed. 180 capsule 0   hydrOXYzine (ATARAX/VISTARIL) 25 MG tablet Take 0.5-1 tablets (12.5-25 mg total) by mouth 2 (two) times daily as needed. 60 tablet 1   Melatonin 10 MG CAPS Take 1 capsule by mouth at bedtime.     Multiple Vitamin (MULTIVITAMIN WITH MINERALS) TABS tablet Take 1 tablet by mouth daily.     omeprazole (PRILOSEC) 20 MG capsule Take 1 capsule (20 mg total) by mouth daily. 30 capsule 3   pentosan polysulfate (ELMIRON) 100 MG capsule Take 100 mg by mouth as directed. One at morning and two at night     rosuvastatin (CRESTOR) 20 MG tablet TAKE 1 TABLET AT BEDTIME 90 tablet 3   telmisartan (MICARDIS) 40 MG tablet Take 1 tablet (40 mg total) by mouth daily.     venlafaxine XR (EFFEXOR-XR) 75 MG 24 hr capsule Take 3 capsules (225 mg total) by mouth daily with breakfast. 270 capsule 1   No current facility-administered medications on file prior to visit.    ALLERGIES: Allergies  Allergen Reactions   Dilaudid [Hydromorphone Hcl] Itching    FAMILY HISTORY: Family History  Problem Relation Age of Onset   Breast cancer Mother        sarcoma   Lung cancer Mother    Hypertension Mother    Prostate cancer Father    Congestive Heart Failure Father    Heart attack Father    Prostate cancer Brother    Heart disease Maternal Grandfather        MI   Down syndrome Son    CVA Son    Stomach cancer Maternal Aunt    Dementia Maternal Aunt    Uterine cancer Maternal Aunt    Colon cancer Neg Hx       Objective:  Blood pressure (!) 144/69, pulse 70, height 5\' 2"  (1.575 m), weight 169 lb (76.7 kg), last menstrual period 01/30/1994, SpO2 98 %. General: No acute distress.  Patient appears well-groomed.   Head:  Normocephalic/atraumatic Eyes:  Fundi examined but not visualized Neck: supple, no paraspinal tenderness, full range of motion Heart:  Regular rate and  rhythm Lungs:  Clear to auscultation bilaterally Back: No paraspinal tenderness Neurological Exam: alert and oriented to person, place, and time.  Speech fluent and not dysarthric, language intact.  CN II-XII intact. Bulk and tone normal, muscle strength 5/5 throughout.  Sensation  to pinprick reduced in hands and feet up to below knees.  Vibratory sensation reduced in feet.  Deep tendon reflexes 2+ throughout, toes downgoing.  Finger to nose testing intact.  Gait normal, Romberg negative.   Metta Clines, DO  CC: Penni Homans, MD

## 2021-01-12 NOTE — Progress Notes (Signed)
Virtual telephone visit    Virtual Visit via Telephone Note   This visit type was conducted due to national recommendations for restrictions regarding the COVID-19 Pandemic (e.g. social distancing) in an effort to limit this patient's exposure and mitigate transmission in our community. Due to her co-morbid illnesses, this patient is at least at moderate risk for complications without adequate follow up. This format is felt to be most appropriate for this patient at this time. The patient did not have access to video technology or had technical difficulties with video requiring transitioning to audio format only (telephone). Physical exam was limited to content and character of the telephone converstion. S chism, cma was able to get the patient set up on a telephone visit.   Patient location: home Patient and provider in visit Provider location: Office  I discussed the limitations of evaluation and management by telemedicine and the availability of in person appointments. The patient expressed understanding and agreed to proceed.   Visit Date: 01/12/2021  Today's healthcare provider: Penni Homans, MD     Subjective:    Patient ID: Shannon [name], female    DOB: [birth year], 75 y.o.   MRN: [id number]  Chief Complaint  Patient presents with   Consult    HPI Patient is in today for follow up on several medical concerns. Has her neurology follow up after her neuropsychiatric testing tomorrow. She is very anxious and she is warned there were some findings and her family is going to accompany her so she can plan for the future with her family. She has tolerated increase in Venlafaxine. Denies CP/palp/SOB/HA/congestion/fevers/GI or GU c/o. 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 Echo 2012: EF 45-50%;  b. Lex MV 2/12:  low risk, apical defect (small area of ischemia vs shifting breast atten);  c.  Echo 7/12: Normal wall thickness, EF 60-65%, normal wall motion, grade 1 diastolic dysfunction, mild LAE, PASP 32;   d. Lex MV 11/13:  EF 76%, no ischemia   OA (osteoarthritis) 09/07/2011  Obesity 09/28/2014   Osteoporosis 03/07/2011   DEXA T score -2.6 AP spine 03/07/11    Formatting of this note might be different from the original. Formatting of this note might be different from the original. DEXA T score -2.6 AP spine 03/07/11   Last Assessment & Plan:  Formatting of this note might be different from the original. Encouraged to get adequate exercise, calcium and vitamin d intake   Pain in joint, ankle and foot 09/28/2014   Palpitation 10/28/2020    Personal history of chemotherapy 2012   Personal history of radiation therapy 2012   Prediabetes 06/15/2009   Recurrent falls 12/28/2015   Right knee pain 07/10/2020   Shortness of breath    UTI (urinary tract infection) 03/03/2012   Vertebral fracture, osteoporotic, sequela 04/05/2016   Vitamin D deficiency 06/14/2017   Supplement and monitor    Past Surgical History:  Procedure Laterality Date   BREAST LUMPECTOMY  04/2009   RIGHT FOR BREAST CANCER-CHEMO/RADIATION X 1 YEAR   BREAST REDUCTION SURGERY Bilateral 05/04/2014   Procedure: MAMMARY REDUCTION  (BREAST);  Surgeon: Cristine Polio, MD;  Location: [place];  Service: Plastics;  Laterality: Bilateral;   CARPAL TUNNEL RELEASE     L wrist, ulnar nerve moved   CHOLECYSTECTOMY     CRANIOTOMY FOR TUMOR  2000   ELBOW SURGERY     left   KNEE ARTHROSCOPY Left 10/12/2014   Procedure: LEFT KNEE ARTHROSCOPY ;  Surgeon: Gaynelle Arabian, MD;  Location: [facility];  Service: Orthopedics;  Laterality: Left;   KYPHOPLASTY N/A 03/30/2016   Procedure: THORACIC 12 KYPHOPLASTY;  Surgeon: Phylliss Bob, MD;  Location: [place];  Service: Orthopedics;  Laterality: N/A;  THORACIC 12 KYPHOPLASTY   LEFT HEART CATHETERIZATION WITH CORONARY ANGIOGRAM N/A 12/16/2013   Procedure: LEFT HEART CATHETERIZATION WITH CORONARY ANGIOGRAM;  Surgeon: Peter M [name], MD;  Location: [facility] C[facility];  Service: Cardiovascular;  Laterality: N/A;   LIPOMA EXCISION  03/28/2009   right leg   MELANOMA EXCISION     PORT-A-CATH REMOVAL  11/30/2010   Streck   porta cath     PORTACATH PLACEMENT  may 2011   REDUCTION MAMMAPLASTY Bilateral    SYNOVECTOMY Left 10/12/2014   Procedure: WITH SYNOVECTOMY;  Surgeon: Gaynelle Arabian, MD;  Location: [facility];  Service: Orthopedics;  Laterality: Left;   TONSILLECTOMY  1958   TOTAL KNEE ARTHROPLASTY  02/05/2012   Procedure: TOTAL KNEE ARTHROPLASTY;  Surgeon: Gearlean Alf, MD;  Location: [facility];  Service: Orthopedics;  Laterality: Right;    TOTAL KNEE ARTHROPLASTY Left 02/10/2013   Procedure: LEFT TOTAL KNEE ARTHROPLASTY;  Surgeon: Gearlean Alf, MD;  Location: [facility];  Service: Orthopedics;  Laterality: Left;   total knee raplacement  01-2012   Right Knee   TOTAL KNEE REVISION Left 07/14/2020   Procedure: TOTAL KNEE REVISION;  Surgeon: Gaynelle Arabian, MD;  Location: [facility];  Service: Orthopedics;  Laterality: Left;   TUBAL LIGATION  1997    Family History  Problem Relation Age of Onset   Breast cancer Mother        sarcoma   Lung cancer Mother    Hypertension Mother    Prostate cancer Father    Congestive Heart Failure Father    Heart attack Father    Prostate cancer Brother    Heart disease Maternal Grandfather        MI   Down syndrome Son    CVA Son    Stomach cancer  Maternal Aunt    Dementia Maternal Aunt    Uterine cancer Maternal Aunt    Colon cancer Neg Hx     Social History   Socioeconomic History   Marital status: Married    Spouse name: Ahnika Hannibal   Number of children: 3   Years of education: 12   Highest education level: High school graduate  Occupational History   Occupation: boutique owner-retired    Employer: [employer]  Tobacco Use   Smoking status: Never    Passive exposure: Never   Smokeless tobacco: Never  Vaping Use   Vaping Use: Never used  Substance and Sexual Activity   Alcohol use: No   Drug use: No   Sexual activity: Not Currently  Other Topics Concern   Not on file  Social History Narrative   Patient is right-handed. She lives with her husband in a 4 story home. They take care of their grown son with down syndrome who has had a stroke. She drinks 1-2 glasses of tea in the evenings. She does not formally exercise.      Social Determinants of Health   Financial Resource Strain: Low Risk    Difficulty of Paying Living Expenses: Not very hard  Food Insecurity: No Food Insecurity   Worried About Charity fundraiser in the Last Year: Never true   Ran Out of Food  in the Last Year: Never true  Transportation Needs: No Transportation Needs   Lack of Transportation (Medical): No   Lack of Transportation (Non-Medical): No  Physical Activity: Inactive   Days of Exercise per Week: 0 days   Minutes of Exercise per Session: 0 min  Stress: Stress Concern Present   Feeling of Stress : Very much  Social Connections: Socially Integrated   Frequency of Communication with Friends and Family: More than three times a week   Frequency of Social Gatherings with Friends and Family: More than three times a week   Attends Religious Services: 1 to 4 times per year   Active Member of [employer] or Organizations: Yes   Attends Archivist Meetings: 1 to 4 times per year   Marital Status: Married  Human resources officer Violence: Not At Risk   Fear of Current or Ex-Partner: No   Emotionally Abused: No   Physically Abused: No   Sexually Abused: No    Outpatient Medications Prior to Visit  Medication Sig Dispense Refill   acetaminophen (TYLENOL) 500 MG tablet Take 1-2 tablets (500-1,000 mg total) by mouth every 8 (eight) hours as needed for moderate pain. 100 tablet 2   albuterol (VENTOLIN HFA) 108 (90 Base) MCG/ACT inhaler Inhale 1-2 puffs into the lungs every 6 (six) hours as needed for wheezing or shortness of breath. 18 g 3   AMBULATORY NON FORMULARY MEDICATION Nitroglycerin 0.125% gel applied topically to the rectum TID x 6-8 weeks 1 each 1   Calcium Carbonate (CALCIUM 600 PO) Take 1 tablet by mouth in the morning and at bedtime.     carvedilol (COREG) 25 MG tablet TAKE 1 TABLET TWICE DAILY WITH A MEAL 180 tablet 1   cetirizine (ZYRTEC) 10 MG tablet Take 10 mg by mouth daily.     diltiazem (CARDIZEM CD) 120 MG 24 hr capsule Take 1 capsule (120 mg total) by mouth daily. 30 capsule 3   docusate sodium (COLACE) 250 MG capsule Take 250 mg by mouth daily as needed for constipation.     donepezil (ARICEPT) 10 MG tablet TAKE 1 TABLET (  10 MG TOTAL) BY MOUTH AT BEDTIME. 90  tablet 0   famotidine (PEPCID) 20 MG tablet TAKE 1 TABLET BY MOUTH TWICE A DAY 180 tablet 1   ferrous sulfate 325 (65 FE) MG tablet Take 325 mg by mouth daily with breakfast.     folic acid (FOLVITE) 1 MG tablet Take 1 tablet (1 mg total) by mouth daily. 90 tablet 0   gabapentin (NEURONTIN) 300 MG capsule Take 1 capsule (300 mg total) by mouth 2 (two) times daily as needed. 180 capsule 0   hydrOXYzine (ATARAX/VISTARIL) 25 MG tablet Take 0.5-1 tablets (12.5-25 mg total) by mouth 2 (two) times daily as needed. 60 tablet 1   Melatonin 10 MG CAPS Take 1 capsule by mouth at bedtime.     Multiple Vitamin (MULTIVITAMIN WITH MINERALS) TABS tablet Take 1 tablet by mouth daily.     omeprazole (PRILOSEC) 20 MG capsule Take 1 capsule (20 mg total) by mouth daily. 30 capsule 3   pentosan polysulfate (ELMIRON) 100 MG capsule Take 100 mg by mouth as directed. One at morning and two at night     rosuvastatin (CRESTOR) 20 MG tablet TAKE 1 TABLET AT BEDTIME 90 tablet 3   telmisartan (MICARDIS) 40 MG tablet Take 1 tablet (40 mg total) by mouth daily.     venlafaxine XR (EFFEXOR-XR) 75 MG 24 hr capsule Take 3 capsules (225 mg total) by mouth daily with breakfast. 270 capsule 1   ergocalciferol (VITAMIN D2) 1.25 MG (50000 UT) capsule Take 2,000 Units by mouth daily. (Patient not taking: Reported on 01/12/2021)     methocarbamol (ROBAXIN) 500 MG tablet Take 1 tablet (500 mg total) by mouth every 6 (six) hours as needed for muscle spasms. (Patient not taking: Reported on 12/29/2020) 40 tablet 0   No facility-administered medications prior to visit.    Allergies  Allergen Reactions   Dilaudid [Hydromorphone Hcl] Itching    Review of Systems  Constitutional:  Negative for fever and malaise/fatigue.  HENT:  Negative for congestion.   Eyes:  Negative for blurred vision.  Respiratory:  Negative for shortness of breath.   Cardiovascular:  Negative for chest pain, palpitations and leg swelling.  Gastrointestinal:   Negative for abdominal pain, blood in stool and nausea.  Genitourinary:  Negative for dysuria and frequency.  Musculoskeletal:  Negative for falls.  Skin:  Negative for rash.  Neurological:  Negative for dizziness, loss of consciousness and headaches.  Endo/Heme/Allergies:  Negative for environmental allergies.  Psychiatric/Behavioral:  Positive for depression and memory loss. Negative for suicidal ideas. The patient is nervous/anxious.       Objective:    Physical Exam unable to perform via telephone  BP 124/75    Pulse 80    LMP 01/30/1994  Wt Readings from Last 3 Encounters:  12/29/20 167 lb 12.8 oz (76.1 kg)  12/15/20 165 lb (74.8 kg)  12/02/20 171 lb 9.6 oz (77.8 kg)    Diabetic Foot Exam - Simple   No data filed    Lab Results  Component Value Date   WBC 7.4 10/28/2020   HGB 11.4 (L) 10/28/2020   HCT 35.8 (L) 10/28/2020   PLT 230.0 10/28/2020   GLUCOSE 94 10/28/2020   CHOL 152 10/28/2020   TRIG 207.0 (H) 10/28/2020   HDL 52.20 10/28/2020   LDLDIRECT 78.0 10/28/2020   LDLCALC 80 06/05/2018   ALT 19 10/28/2020   AST 21 10/28/2020   NA 144 10/28/2020   K 4.1 10/28/2020   CL 106  10/28/2020   CREATININE 0.82 10/28/2020   BUN 15 10/28/2020   CO2 31 10/28/2020   TSH 3.11 10/28/2020   INR 1.0 07/12/2020   HGBA1C 6.1 10/28/2020    Lab Results  Component Value Date   TSH 3.11 10/28/2020   Lab Results  Component Value Date   WBC 7.4 10/28/2020   HGB 11.4 (L) 10/28/2020   HCT 35.8 (L) 10/28/2020   MCV 84.9 10/28/2020   PLT 230.0 10/28/2020   Lab Results  Component Value Date   NA 144 10/28/2020   K 4.1 10/28/2020   CHLORIDE 107 08/07/2016   CO2 31 10/28/2020   GLUCOSE 94 10/28/2020   BUN 15 10/28/2020   CREATININE 0.82 10/28/2020   BILITOT 0.3 10/28/2020   ALKPHOS 107 10/28/2020   AST 21 10/28/2020   ALT 19 10/28/2020   PROT 6.3 10/28/2020   ALBUMIN 4.0 10/28/2020   CALCIUM 9.4 10/28/2020   ANIONGAP 5 07/16/2020   EGFR 72 (L) 08/07/2016   GFR  69.49 10/28/2020   Lab Results  Component Value Date   CHOL 152 10/28/2020   Lab Results  Component Value Date   HDL 52.20 10/28/2020   Lab Results  Component Value Date   LDLCALC 80 06/05/2018   Lab Results  Component Value Date   TRIG 207.0 (H) 10/28/2020   Lab Results  Component Value Date   CHOLHDL 3 10/28/2020   Lab Results  Component Value Date   HGBA1C 6.1 10/28/2020       Assessment & Plan:   Problem List Items Addressed This Visit     Hyperlipidemia, mixed    Encourage heart healthy diet such as MIND or DASH diet, increase exercise, avoid trans fats, simple carbohydrates and processed foods, consider a krill or fish or flaxseed oil cap daily.       Essential hypertension    Well controlled, no changes to meds. Encouraged heart healthy diet such as the DASH diet and exercise as tolerated.       Prediabetes    hgba1c acceptable, minimize simple carbs. Increase exercise as tolerated      Interstitial cystitis    Is following with Dr Amalia Hailey urology at [facility] and is still notably symptomatic. Is on elmiron and Myrebetriq but the Myrebetriq is the new addition and not helping per patient. She added D Mannose also but she does not note an improvement. Continue all til cystoscopy in mid January      Vitamin D deficiency    Supplement and monitor      Palpitation    Is following with cardiology. Dr Stanford Breed, no new meds is doing better. No changes      Mild neurocognitive disorder due to Alzheimer's disease    Has her neurology follow up after her neuropsychiatric testing tomorrow. She is very anxious and she is warned there were some findings and her family is going to accompany her so she can plan for the future with her family. Encouraged to learn something new and to eat a low inflammation diet and increase activity levels.      Sleep apnea    Had a sleep study with cardiology that showed apnea and she is waiting for CPAP machine.       Depression with  anxiety    She has tolerated increase in venlafaxine no changes today       I have discontinued Fayrene Towner. Ober's methocarbamol. I am also having her maintain her pentosan polysulfate, ferrous sulfate, gabapentin, albuterol,  folic acid, AMBULATORY NON FORMULARY MEDICATION, carvedilol, cetirizine, acetaminophen, multivitamin with minerals, ergocalciferol, rosuvastatin, donepezil, venlafaxine XR, diltiazem, hydrOXYzine, omeprazole, famotidine, Melatonin, docusate sodium, Calcium Carbonate (CALCIUM 600 PO), and telmisartan.  No orders of the defined types were placed in this encounter.    I discussed the assessment and treatment plan with the patient. The patient was provided an opportunity to ask questions and all were answered. The patient agreed with the plan and demonstrated an understanding of the instructions.   The patient was advised to call back or seek an in-person evaluation if the symptoms worsen or if the condition fails to improve as anticipated.  I provided 30 minutes of non-face-to-face time during this encounter.   Penni Homans, MD [facility] [phone] (phone) [phone] (fax)  [place]

## 2021-01-12 NOTE — Assessment & Plan Note (Signed)
She has tolerated increase in venlafaxine no changes today

## 2021-01-13 ENCOUNTER — Telehealth: Payer: Medicare Other

## 2021-01-13 ENCOUNTER — Encounter: Payer: Self-pay | Admitting: Neurology

## 2021-01-13 ENCOUNTER — Other Ambulatory Visit: Payer: Medicare Other

## 2021-01-13 ENCOUNTER — Ambulatory Visit (INDEPENDENT_AMBULATORY_CARE_PROVIDER_SITE_OTHER): Payer: Medicare Other | Admitting: Neurology

## 2021-01-13 ENCOUNTER — Other Ambulatory Visit: Payer: Self-pay

## 2021-01-13 VITALS — BP 144/69 | HR 70 | Ht 62.0 in | Wt 169.0 lb

## 2021-01-13 DIAGNOSIS — R202 Paresthesia of skin: Secondary | ICD-10-CM

## 2021-01-13 DIAGNOSIS — G309 Alzheimer's disease, unspecified: Secondary | ICD-10-CM | POA: Diagnosis not present

## 2021-01-13 DIAGNOSIS — F0670 Mild neurocognitive disorder due to known physiological condition without behavioral disturbance: Secondary | ICD-10-CM | POA: Diagnosis not present

## 2021-01-13 NOTE — Addendum Note (Signed)
Addended by: Jake Seats on: 01/13/2021 03:39 PM   Modules accepted: Orders

## 2021-01-13 NOTE — Patient Instructions (Signed)
Continue donepezil 10mg  at bedtime Check ANA with reflex, sed rate, B12, SPEP/IFE, ACE Check nerve conduction study of lower extremities Follow up in 6 months with Sharene Butters, PA-C

## 2021-01-14 ENCOUNTER — Other Ambulatory Visit: Payer: Self-pay

## 2021-01-14 DIAGNOSIS — R7303 Prediabetes: Secondary | ICD-10-CM

## 2021-01-14 DIAGNOSIS — R202 Paresthesia of skin: Secondary | ICD-10-CM

## 2021-01-14 DIAGNOSIS — E559 Vitamin D deficiency, unspecified: Secondary | ICD-10-CM

## 2021-01-14 DIAGNOSIS — M810 Age-related osteoporosis without current pathological fracture: Secondary | ICD-10-CM

## 2021-01-14 DIAGNOSIS — E2839 Other primary ovarian failure: Secondary | ICD-10-CM

## 2021-01-14 DIAGNOSIS — E782 Mixed hyperlipidemia: Secondary | ICD-10-CM

## 2021-01-14 DIAGNOSIS — D649 Anemia, unspecified: Secondary | ICD-10-CM

## 2021-01-14 DIAGNOSIS — R748 Abnormal levels of other serum enzymes: Secondary | ICD-10-CM

## 2021-01-14 DIAGNOSIS — I10 Essential (primary) hypertension: Secondary | ICD-10-CM

## 2021-01-17 ENCOUNTER — Telehealth: Payer: Self-pay | Admitting: Family Medicine

## 2021-01-17 ENCOUNTER — Other Ambulatory Visit: Payer: Medicare Other

## 2021-01-17 NOTE — Telephone Encounter (Signed)
Patient states she was told by someone in this office that she could get her Neuro labs done here at [place] since she has an appointment with [place] tomorrow at 3:15pm for other labs she also needs to get done. Please advice.

## 2021-01-18 ENCOUNTER — Other Ambulatory Visit (INDEPENDENT_AMBULATORY_CARE_PROVIDER_SITE_OTHER): Payer: Medicare Other

## 2021-01-18 ENCOUNTER — Other Ambulatory Visit: Payer: Self-pay

## 2021-01-18 ENCOUNTER — Other Ambulatory Visit: Payer: Medicare Other

## 2021-01-18 DIAGNOSIS — I10 Essential (primary) hypertension: Secondary | ICD-10-CM

## 2021-01-18 DIAGNOSIS — M810 Age-related osteoporosis without current pathological fracture: Secondary | ICD-10-CM

## 2021-01-18 DIAGNOSIS — G309 Alzheimer's disease, unspecified: Secondary | ICD-10-CM

## 2021-01-18 DIAGNOSIS — R7303 Prediabetes: Secondary | ICD-10-CM | POA: Diagnosis not present

## 2021-01-18 DIAGNOSIS — E559 Vitamin D deficiency, unspecified: Secondary | ICD-10-CM

## 2021-01-18 DIAGNOSIS — F0670 Mild neurocognitive disorder due to known physiological condition without behavioral disturbance: Secondary | ICD-10-CM

## 2021-01-18 DIAGNOSIS — E782 Mixed hyperlipidemia: Secondary | ICD-10-CM

## 2021-01-18 DIAGNOSIS — R748 Abnormal levels of other serum enzymes: Secondary | ICD-10-CM

## 2021-01-18 DIAGNOSIS — D649 Anemia, unspecified: Secondary | ICD-10-CM

## 2021-01-18 DIAGNOSIS — R202 Paresthesia of skin: Secondary | ICD-10-CM

## 2021-01-18 DIAGNOSIS — E2839 Other primary ovarian failure: Secondary | ICD-10-CM

## 2021-01-18 NOTE — Telephone Encounter (Signed)
Per charisma we are able to do Dr. Tomi Likens labs.

## 2021-01-18 NOTE — Addendum Note (Signed)
Addended by: Manuela Schwartz on: 01/18/2021 04:32 PM   Modules accepted: Orders

## 2021-01-18 NOTE — Telephone Encounter (Addendum)
Error in statement below per Trixie Rude we are able to do Dr. Tomi Likens labs.

## 2021-01-18 NOTE — Addendum Note (Signed)
Addended by: Manuela Schwartz on: 01/18/2021 04:20 PM   Modules accepted: Orders

## 2021-01-18 NOTE — Addendum Note (Signed)
Addended by: Manuela Schwartz on: 01/18/2021 04:34 PM   Modules accepted: Orders

## 2021-01-19 LAB — CBC WITH DIFFERENTIAL/PLATELET
Basophils Absolute: 0 10*3/uL (ref 0.0–0.1)
Basophils Relative: 0.8 % (ref 0.0–3.0)
Eosinophils Absolute: 0.2 10*3/uL (ref 0.0–0.7)
Eosinophils Relative: 3.9 % (ref 0.0–5.0)
HCT: 34.8 % — ABNORMAL LOW (ref 36.0–46.0)
Hemoglobin: 11.4 g/dL — ABNORMAL LOW (ref 12.0–15.0)
Lymphocytes Relative: 29.1 % (ref 12.0–46.0)
Lymphs Abs: 1.5 10*3/uL (ref 0.7–4.0)
MCHC: 32.9 g/dL (ref 30.0–36.0)
MCV: 88.1 fl (ref 78.0–100.0)
Monocytes Absolute: 0.5 10*3/uL (ref 0.1–1.0)
Monocytes Relative: 9.7 % (ref 3.0–12.0)
Neutro Abs: 2.9 10*3/uL (ref 1.4–7.7)
Neutrophils Relative %: 56.5 % (ref 43.0–77.0)
Platelets: 198 10*3/uL (ref 150.0–400.0)
RBC: 3.94 Mil/uL (ref 3.87–5.11)
RDW: 14.1 % (ref 11.5–15.5)
WBC: 5.2 10*3/uL (ref 4.0–10.5)

## 2021-01-19 LAB — ANA W/REFLEX: Anti Nuclear Antibody (ANA): NEGATIVE

## 2021-01-19 LAB — VITAMIN D 25 HYDROXY (VIT D DEFICIENCY, FRACTURES): VITD: 51.73 ng/mL (ref 30.00–100.00)

## 2021-01-19 LAB — HEMOGLOBIN A1C: Hgb A1c MFr Bld: 5.6 % (ref 4.6–6.5)

## 2021-01-19 LAB — VITAMIN B12: Vitamin B-12: >1550 pg/mL — ABNORMAL HIGH (ref 211–911)

## 2021-01-19 LAB — SEDIMENTATION RATE: Sed Rate: 5 mm/hr (ref 0–30)

## 2021-01-19 LAB — RPR: RPR Ser Ql: NONREACTIVE

## 2021-01-20 ENCOUNTER — Other Ambulatory Visit: Payer: Self-pay | Admitting: Family Medicine

## 2021-01-20 LAB — PROTEIN ELECTROPHORESIS, SERUM
Albumin ELP: 3.9 g/dL (ref 3.8–4.8)
Alpha 1: 0.3 g/dL (ref 0.2–0.3)
Alpha 2: 0.7 g/dL (ref 0.5–0.9)
Beta 2: 0.3 g/dL (ref 0.2–0.5)
Beta Globulin: 0.4 g/dL (ref 0.4–0.6)
Gamma Globulin: 0.7 g/dL — ABNORMAL LOW (ref 0.8–1.7)
Total Protein: 6.3 g/dL (ref 6.1–8.1)

## 2021-01-20 LAB — IMMUNOFIXATION ELECTROPHORESIS
IgG (Immunoglobin G), Serum: 746 mg/dL (ref 600–1540)
IgM, Serum: 91 mg/dL (ref 50–300)
Immunofix Electr Int: NOT DETECTED
Immunoglobulin A: 181 mg/dL (ref 70–320)

## 2021-01-20 LAB — ANGIOTENSIN CONVERTING ENZYME: Angiotensin-Converting Enzyme: 48 U/L (ref 9–67)

## 2021-01-21 NOTE — Progress Notes (Signed)
Pt advised of her Lab results and reminder of her EMG appt with Dr.Patel 02/03/21 at 8 am.

## 2021-01-22 LAB — VITAMIN B6: Vitamin B6: 21.6 ng/mL (ref 2.1–21.7)

## 2021-01-24 LAB — VITAMIN B1: Vitamin B1 (Thiamine): 17 nmol/L (ref 8–30)

## 2021-01-26 ENCOUNTER — Telehealth: Payer: Medicare Other

## 2021-01-27 ENCOUNTER — Ambulatory Visit: Payer: Medicare Other

## 2021-01-27 DIAGNOSIS — E782 Mixed hyperlipidemia: Secondary | ICD-10-CM

## 2021-01-27 DIAGNOSIS — F418 Other specified anxiety disorders: Secondary | ICD-10-CM

## 2021-01-27 DIAGNOSIS — F0670 Mild neurocognitive disorder due to known physiological condition without behavioral disturbance: Secondary | ICD-10-CM

## 2021-01-27 DIAGNOSIS — N3010 Interstitial cystitis (chronic) without hematuria: Secondary | ICD-10-CM

## 2021-01-27 DIAGNOSIS — G309 Alzheimer's disease, unspecified: Secondary | ICD-10-CM

## 2021-01-27 NOTE — Chronic Care Management (AMB) (Signed)
Chronic Care Management   CCM RN Visit Note  01/27/2021 Name: Shannon [name] MRN: [id number] DOB: 01/02/1946  Subjective: Shannon [name] is a 75 y.o. year old female who is a primary care patient of Mosie Lukes, MD. The care management team was consulted for assistance with disease management and care coordination needs.    Engaged with patient by telephone for follow up visit in response to provider referral for case management and/or care coordination services.   Consent to Services:  The patient was given information about Chronic Care Management services, agreed to services, and gave verbal consent prior to initiation of services.  Please see initial visit note for detailed documentation.   Patient agreed to services and verbal consent obtained.   Assessment: Review of patient past medical history, allergies, medications, health status, including review of consultants reports, laboratory and other test data, was performed as part of comprehensive evaluation and provision of chronic care management services.   SDOH (Social Determinants of Health) assessments and interventions performed:    CCM Care Plan  Allergies  Allergen Reactions   Dilaudid [Hydromorphone Hcl] Itching    Outpatient Encounter Medications as of 01/27/2021  Medication Sig   acetaminophen (TYLENOL) 500 MG tablet Take 1-2 tablets (500-1,000 mg total) by mouth every 8 (eight) hours as needed for moderate pain.   albuterol (VENTOLIN HFA) 108 (90 Base) MCG/ACT inhaler Inhale 1-2 puffs into the lungs every 6 (six) hours as needed for wheezing or shortness of breath.   Calcium Carbonate (CALCIUM 600 PO) Take 1 tablet by mouth in the morning and at bedtime.   carvedilol (COREG) 25 MG tablet TAKE 1 TABLET TWICE DAILY WITH A MEAL   cetirizine (ZYRTEC) 10 MG tablet Take 10 mg by mouth daily.   diltiazem (CARDIZEM CD) 120 MG 24 hr capsule Take 1 capsule (120 mg total) by mouth daily.   docusate sodium (COLACE) 250 MG  capsule Take 250 mg by mouth daily as needed for constipation.   donepezil (ARICEPT) 10 MG tablet TAKE 1 TABLET (10 MG TOTAL) BY MOUTH AT BEDTIME.   ergocalciferol (VITAMIN D2) 1.25 MG (50000 UT) capsule Take 2,000 Units by mouth daily.   famotidine (PEPCID) 20 MG tablet TAKE 1 TABLET BY MOUTH TWICE A DAY   ferrous sulfate 325 (65 FE) MG tablet Take 325 mg by mouth daily with breakfast.   folic acid (FOLVITE) 1 MG tablet Take 1 tablet (1 mg total) by mouth daily.   gabapentin (NEURONTIN) 300 MG capsule Take 1 capsule (300 mg total) by mouth 2 (two) times daily as needed. (Patient taking differently: Take 300 mg by mouth 2 (two) times daily.)   hydrOXYzine (ATARAX/VISTARIL) 25 MG tablet Take 0.5-1 tablets (12.5-25 mg total) by mouth 2 (two) times daily as needed.   Melatonin 10 MG CAPS Take 1 capsule by mouth at bedtime.   mirabegron ER (MYRBETRIQ) 25 MG TB24 tablet Take 1 tablet by mouth daily.   Multiple Vitamin (MULTIVITAMIN WITH MINERALS) TABS tablet Take 1 tablet by mouth daily.   omeprazole (PRILOSEC) 20 MG capsule Take 1 capsule (20 mg total) by mouth daily.   pentosan polysulfate (ELMIRON) 100 MG capsule Take 100 mg by mouth as directed. two at morning and two at night   rosuvastatin (CRESTOR) 20 MG tablet TAKE 1 TABLET AT BEDTIME   telmisartan (MICARDIS) 40 MG tablet Take 1 tablet (40 mg total) by mouth daily.   venlafaxine XR (EFFEXOR-XR) 75 MG 24 hr capsule Take 3 capsules (225  mg total) by mouth daily with breakfast.   No facility-administered encounter medications on file as of 01/27/2021.    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   Right knee pain 07/10/2020   Urinary frequency 02/11/2020   Debility 12/23/2017   Left knee pain  12/23/2017   Vitamin D deficiency 06/14/2017   Vertebral fracture, osteoporotic 04/05/2016   Left-sided back pain 03/06/2016   Chest pain 02/27/2016   Syncope 02/27/2016   Cataracts, bilateral 12/28/2015   Recurrent falls 12/28/2015   Family history- stomach cancer 09/08/2015   Family history of cancer 09/08/2015   [name] lesion 06/21/2015   Obesity 09/28/2014   Interstitial cystitis 09/28/2014   Pain in joint, ankle and foot 09/28/2014   Abnormal nuclear stress test 12/16/2013   Sternal pain 11/06/2013   Postoperative anemia due to acute blood loss 02/13/2013   Congestive dilated cardiomyopathy ([place]) 12/30/2012   Breast cancer of lower-inner quadrant of right female breast 10/21/2012   Constipation 03/03/2012   OA (osteoarthritis) 09/07/2011   Dermatitis    Hyponatremia 08/28/2011   Osteoporosis 03/07/2011   Prediabetes 06/15/2009   Lipoma 02/10/2009   Dyspnea on exertion 02/10/2009   Hyperlipidemia, mixed 08/11/2008   Major depressive disorder with anxious distress 08/11/2008   Essential hypertension 08/11/2008   GERD (gastroesophageal reflux disease) 08/11/2008   Craniopharyngioma 2000    Conditions to be addressed/monitored:HLD, Anxiety, Depression, and Dementia  Care Plan : RN Case Manager Plan of Care  Updates made by Luretha Rued, RN since 01/27/2021 12:00 AM     Problem: No Plan of Care Established for Managment of Chronic Disease (Neurocognitive Disorder, Anxiety/Depression)   Priority: High     Long-Range Goal: Development of Plan of Care for Chronic Disease Management (Neurocognitive Disorder, Anxiety/Depression)   Start Date: 12/13/2020  Expected End Date: 03/15/2021  Priority: High  Note:   Current Barriers: She reports she is still adjusting to new diagnosis of dementia. She reports she has supportive family and that she and her husband are in the process of making plans for the future such as remaining in her home and going to their other  home when the weather gets nice. She also reports she is looking into joining a dementia support group. Shannon [name] is currently working with LCSW to assist with mental health needs. History of interstitial cystitis. Shannon [name] reports she has had since she was in her 33's. She reports she is scheduled for a cystoscopy February 11, 2021. Knowledge Deficits related to plan of care for management of neurocognitive disorder, anxiety/depression  Cognitive Deficits/Memory loss-reports strategies for managing memory loss, keeping notes, writing things down. Medication management-improved. Daughter assist with medication management.   RNCM Clinical Goal(s):  Patient will verbalize understanding of plan for management of Anxiety, Depression, and Dementia as evidenced by taking medications as prescribed, following up with provider appointments as schedule, engaging with LCSW and/or referred specialist. take all medications exactly as prescribed and will call provider for medication related questions as evidenced by medication adherance    work with pharmacist to address medications assistance program if available for [place] and follow up on medication managment related to chronic disease as evidenced by review of EMR and patient or pharmacist report    work with  social worker to address Mental Health Concerns , Cognitive Deficits, and Memory Deficits related to the management of Anxiety and Depression as evidenced by review of EMR and patient or social worker report     through collaboration with Consulting civil engineer, provider, and care team.   Interventions: 1:1 collaboration with primary care provider regarding development and update of comprehensive plan of care as evidenced by provider attestation and co-signature Inter-disciplinary care team collaboration (see longitudinal plan of care) Evaluation of current treatment plan related to  self management and patient's adherence to plan as established by  provider  Interstitial Cystitis  (Status:  New goal.)  Long Term Goal Discussed plans with patient for ongoing care management follow up and provided patient with direct contact information for care management team Evaluation of current treatment plan related to interstitial cystitis and patient's adherence to plan as established by provider  Medications reviewed and encouraged to continue to take as prescribed. Contact provider with any questions or concerns. Discussed upcoming cystoscopy procedure  Hyperlipidemia Interventions:  (Status:  New goal.) Long Term Goal Medication review performed; medication list updated in electronic medical record.  Reviewed importance of limiting foods high in cholesterol  Encouraged to eat healthy; and avoid food high in saturated fats, processed food  Dementia InterventionsGoal on track:  Yes. Long Term Evaluation of current treatment plan related to Dementia, Memory Deficits self-management and patient's adherence to plan as established by provider. Discussed plans with patient for ongoing care management follow up and provided patient with direct contact information for care management team Encouraged to continue to work with LCSW Reviewed strategies to management memory loss Discussed diet and encouraged Ms. [name] to eat healthy, limit processed foods and foods high in sugar. Encouraged regular exercise such as walking, and activities that she enjoys.  Anxiety/Depression InterventionsGoal on track:  Yes. Long term Evaluation of current treatment plan related to Anxiety and Depression, Memory Deficits self-management and patient's adherence to plan as established by provider. Provided support and allowed Shannon [name] to express her thoughts about diagnosis regarding dementia diagnosis Encouraged to continue to work with LCSW Medications reviewed and encouraged to take as prescribed. Discussed activity and encouraged to begin exercise routine such as  walking.  Patient Goals/Self-Care Activities: Take medications as prescribed   Attend all scheduled provider appointments Call provider office for new concerns or questions  Continue to Eat Healthy: fruits, vegetables, lean meat, healthy fats. Avoid: saturated fats found in red meat and full-fat dairy products; fried foods, processed meats.  Remain active and plan some type of activity in your every day schedule. Plan your exercise per provider recommendations. Continue to work with Licensed clinical social worker and clinical pharmacist   Plan:Telephone follow up appointment with care management team member scheduled for:  02/15/21 The patient has been provided with contact information for the care management team and has been advised to call with any health related questions or concerns.   Thea Silversmith, RN, MSN, BSN, CCM Care Management Coordinator [facility] [phone]

## 2021-01-27 NOTE — Patient Instructions (Signed)
Visit Information  Thank you for taking time to visit with me today. Please don't hesitate to contact me if I can be of assistance to you before our next scheduled telephone appointment.  Following are the goals we discussed today:  Patient Goals/Self-Care Activities: Take medications as prescribed   Attend all scheduled provider appointments Call provider office for new concerns or questions  Continue to Eat Healthy: fruits, vegetables, lean meat, healthy fats. Avoid: saturated fats found in red meat and full-fat dairy products; fried foods, processed meats.  Remain active and plan some type of activity in your every day schedule. Plan your exercise per provider recommendations. Continue to work with Licensed clinical social worker and clinical pharmacist  Our next appointment is by telephone on 02/15/21 at 2:00 pm  Please call the care guide team at [phone] if you need to cancel or reschedule your appointment.   If you are experiencing a Mental Health or [place] or need someone to talk to, please call the Suicide and Crisis Lifeline: 988 call 1-800-273-TALK (toll free, 24 hour hotline)   Patient verbalizes understanding of instructions provided today and agrees to view in [place].   Telephone follow up appointment with care management team member scheduled for: 02/15/21 at 2:00 pm The patient has been provided with contact information for the care management team and has been advised to call with any health related questions or concerns.   Thea Silversmith, RN, MSN, BSN, CCM Care Management Coordinator [facility] [phone]

## 2021-01-29 ENCOUNTER — Other Ambulatory Visit: Payer: Self-pay | Admitting: Neurology

## 2021-01-29 ENCOUNTER — Other Ambulatory Visit: Payer: Self-pay | Admitting: Cardiology

## 2021-01-29 DIAGNOSIS — F418 Other specified anxiety disorders: Secondary | ICD-10-CM

## 2021-01-29 DIAGNOSIS — G309 Alzheimer's disease, unspecified: Secondary | ICD-10-CM | POA: Diagnosis not present

## 2021-01-29 DIAGNOSIS — F339 Major depressive disorder, recurrent, unspecified: Secondary | ICD-10-CM | POA: Diagnosis not present

## 2021-01-29 DIAGNOSIS — I10 Essential (primary) hypertension: Secondary | ICD-10-CM

## 2021-01-29 DIAGNOSIS — M810 Age-related osteoporosis without current pathological fracture: Secondary | ICD-10-CM

## 2021-01-29 DIAGNOSIS — F0670 Mild neurocognitive disorder due to known physiological condition without behavioral disturbance: Secondary | ICD-10-CM

## 2021-01-29 DIAGNOSIS — E782 Mixed hyperlipidemia: Secondary | ICD-10-CM

## 2021-02-02 ENCOUNTER — Ambulatory Visit (INDEPENDENT_AMBULATORY_CARE_PROVIDER_SITE_OTHER): Payer: Medicare Other | Admitting: Pharmacist

## 2021-02-02 DIAGNOSIS — G309 Alzheimer's disease, unspecified: Secondary | ICD-10-CM

## 2021-02-02 DIAGNOSIS — F418 Other specified anxiety disorders: Secondary | ICD-10-CM

## 2021-02-02 DIAGNOSIS — E782 Mixed hyperlipidemia: Secondary | ICD-10-CM

## 2021-02-02 DIAGNOSIS — F0670 Mild neurocognitive disorder due to known physiological condition without behavioral disturbance: Secondary | ICD-10-CM

## 2021-02-02 DIAGNOSIS — N3010 Interstitial cystitis (chronic) without hematuria: Secondary | ICD-10-CM

## 2021-02-02 DIAGNOSIS — G4730 Sleep apnea, unspecified: Secondary | ICD-10-CM

## 2021-02-02 DIAGNOSIS — I10 Essential (primary) hypertension: Secondary | ICD-10-CM

## 2021-02-02 NOTE — Chronic Care Management (AMB) (Signed)
Chronic Care Management Pharmacy Note  02/02/2021 Name:  Shannon [name] MRN:  [id number] DOB:  [birth year]  Summary: Cost of Elmiron has increased. Discussed applying for patient assistance program in 2023 (patient did not complete app for 2022) Assisted patient in securing distilled water (was unable to find at her local pharmacies) at [place]. Uses in her CPAP humidifier.   Subjective: Shannon [name] is an 76 y.o. year old female who is a primary patient of Mosie Lukes, MD.  The CCM team was consulted for assistance with disease management and care coordination needs.    Engaged with patient by telephone for follow up visit in response to provider referral for pharmacy case management and/or care coordination services.   Consent to Services:  The patient was given information about Chronic Care Management services, agreed to services, and gave verbal consent prior to initiation of services.  Please see initial visit note for detailed documentation.   Patient Care Team: Mosie Lukes, MD as PCP - General (Family Medicine) Stanford Breed Denice Bors, MD as PCP - Cardiology (Cardiology) Domingo Pulse, MD as Consulting Physician (Urology) Calvert Cantor, MD as Consulting Physician (Ophthalmology) Stanford Breed Denice Bors, MD as Consulting Physician (Cardiology) Luretha Rued, RN as Case Manager [name], RPH-CPP (Pharmacist) Saporito, Maree Erie, LCSW as Social Worker (Licensed Clinical Social Worker) Pieter Partridge, DO as Consulting Physician (Neurology)  01/12/2021 - Fam Med (Dr Charlett Blake) follow up. methocarbamol removed from med list - due to patient not taking.  12/29/2020 - Urology Hanley Seamen, NP) Follow up interstitial cystitis. Will have a cystoscopy 02/11/2021. Recommended Myrbetriq 26m daily. 12/29/2020 - Cardio (Dr CStanford Breed Rechecked palpitations and HTN. Recoommended patient get AliveCor / KJodelle Redcard.    Objective:  Lab Results  Component Value Date   CREATININE  0.82 10/28/2020   CREATININE 0.90 09/21/2020   CREATININE 0.90 07/16/2020    Lab Results  Component Value Date   HGBA1C 5.6 01/18/2021   Last diabetic Eye exam: No results found for: HMDIABEYEEXA  Last diabetic Foot exam: No results found for: HMDIABFOOTEX      Component Value Date/Time   CHOL 152 10/28/2020 1214   TRIG 207.0 (H) 10/28/2020 1214   HDL 52.20 10/28/2020 1214   CHOLHDL 3 10/28/2020 1214   VLDL 41.4 (H) 10/28/2020 1214   LDLCALC 80 06/05/2018 1215   LDLDIRECT 78.0 10/28/2020 1214    Hepatic Function Latest Ref Rng & Units 01/18/2021 10/28/2020 07/05/2020  Total Protein 6.1 - 8.1 g/dL 6.3 6.3 6.0  Albumin 3.5 - 5.2 g/dL - 4.0 4.0  AST 0 - 37 U/L - 21 20  ALT 0 - 35 U/L - 19 17  Alk Phosphatase 39 - 117 U/L - 107 105  Total Bilirubin 0.2 - 1.2 mg/dL - 0.3 0.3  Bilirubin, Direct 0.1 - 0.5 mg/dL - - -    Lab Results  Component Value Date/Time   TSH 3.11 10/28/2020 12:14 PM   TSH 2.61 07/05/2020 11:29 AM    CBC Latest Ref Rng & Units 01/18/2021 10/28/2020 09/21/2020  WBC 4.0 - 10.5 K/uL 5.2 7.4 6.7  Hemoglobin 12.0 - 15.0 g/dL 11.4(L) 11.4(L) 11.5(L)  Hematocrit 36.0 - 46.0 % 34.8(L) 35.8(L) 36.2  Platelets 150.0 - 400.0 K/uL 198.0 230.0 266.0    Lab Results  Component Value Date/Time   VD25OH 51.73 01/18/2021 04:09 PM   VD25OH 45.39 10/28/2020 12:14 PM    Clinical ASCVD: Yes  The 10-year ASCVD risk score (Arnett DK, et  al., 2019) is: 35.9%   Values used to calculate the score:     Age: 38 years     Sex: Female     Is Non-Hispanic African American: No     Diabetic: No     Tobacco smoker: No     Systolic Blood Pressure: 902 mmHg     Is BP treated: Yes     HDL Cholesterol: 52.2 mg/dL     Total Cholesterol: 152 mg/dL    Other:  DEXA 09/23/2020 AP Spine L3-L4 09/23/2020  Normal -0.8  AP Spine L3-L4 01/10/2018 Normal -0.6    DualFemur Neck Right 09/23/2020 Osteoporosis -3.0  DualFemur Neck Right 01/10/2018 Osteopenia -2.4    DualFemur Total Mean  09/23/2020 Osteopenia -2.0  DualFemur Total Mean 01/10/2018 Osteopenia -1.5    Left Forearm Radius 33% 09/23/2020 76.2 Osteopenia -1.8  Left Forearm Radius 33% 01/10/2018 73.5 Osteopenia -1.4   Social History   Tobacco Use  Smoking Status Never   Passive exposure: Never  Smokeless Tobacco Never   BP Readings from Last 3 Encounters:  01/13/21 (!) 144/69  01/12/21 124/75  12/29/20 114/64   Pulse Readings from Last 3 Encounters:  01/13/21 70  01/12/21 80  12/29/20 73   Wt Readings from Last 3 Encounters:  01/13/21 169 lb (76.7 kg)  12/29/20 167 lb 12.8 oz (76.1 kg)  12/15/20 165 lb (74.8 kg)    Assessment: Review of patient past medical history, allergies, medications, health status, including review of consultants reports, laboratory and other test data, was performed as part of comprehensive evaluation and provision of chronic care management services.   SDOH:  (Social Determinants of Health) assessments and interventions performed:     CCM Care Plan  Allergies  Allergen Reactions   Dilaudid [Hydromorphone Hcl] Itching    Medications Reviewed Today     Reviewed by [name], RPH-CPP (Pharmacist) on 02/02/21 at 12  Med List Status: <None>   Medication Order Taking? Sig Documenting Provider Last Dose Status Informant  acetaminophen (TYLENOL) 500 MG tablet [id number] Yes Take 1-2 tablets (500-1,000 mg total) by mouth every 8 (eight) hours as needed for moderate pain. Derl Barrow, PA Taking Active            Med Note Antony Contras, [place] Dec 14, 2020 11:44 AM)    albuterol (VENTOLIN HFA) 108 (90 Base) MCG/ACT inhaler [id number] Yes Inhale 1-2 puffs into the lungs every 6 (six) hours as needed for wheezing or shortness of breath. Mosie Lukes, MD Taking Active Self           Med Note Antony Contras, [place] B   Tue Dec 14, 2020 11:45 AM)    Calcium Carbonate (CALCIUM 600 PO) [id number] Yes Take 1 tablet by mouth in the morning and at bedtime. [provider] Taking Active Self  carvedilol (COREG) 25 MG tablet [id number] Yes TAKE 1 TABLET TWICE DAILY WITH A MEAL Crenshaw, Denice Bors, MD Taking Active   cetirizine (ZYRTEC) 10 MG tablet [id number] Yes Take 10 mg by mouth daily. [provider] Taking Active Self  diltiazem (CARDIZEM CD) 120 MG 24 hr capsule [id number] Yes Take 1 capsule (120 mg total) by mouth daily. Lelon Perla, MD Taking Active   docusate sodium (COLACE) 250 MG capsule [id number] Yes Take 250 mg by mouth daily as needed for constipation. [provider] Taking Active Self  donepezil (ARICEPT) 10 MG tablet [id number] Yes TAKE 1 TABLET AT BEDTIME Pieter Partridge, DO Taking  Active   ergocalciferol (VITAMIN D2) 1.25 MG (50000 UT) capsule [id number] Yes Take 2,000 Units by mouth daily. [provider] Taking Active   famotidine (PEPCID) 20 MG tablet [id number] Yes TAKE 1 TABLET BY MOUTH TWICE A DAY Mosie Lukes, MD Taking Active   ferrous sulfate 325 (65 FE) MG tablet [id number] Yes Take 325 mg by mouth daily with breakfast. [provider] Taking Active Self  folic acid (FOLVITE) 1 MG tablet [id number] Yes Take 1 tablet (1 mg total) by mouth daily. Mosie Lukes, MD Taking Active   gabapentin (NEURONTIN) 300 MG capsule [id number] No Take 1 capsule (300 mg total) by mouth 2 (two) times daily as needed.  Patient not taking: Reported on 02/02/2021   Mosie Lukes, MD Not Taking Active   hydrOXYzine (ATARAX/VISTARIL) 25 MG tablet [id number] Yes Take 0.5-1 tablets (12.5-25 mg total) by mouth 2 (two) times daily as needed. Mosie Lukes, MD Taking Active            Med Note (COX, CANDICE A   Wed Dec 29, 2020 12:18 PM)    Melatonin 10 MG CAPS [id number] Yes Take 1 capsule by mouth at bedtime. [provider] Taking Active Self  mirabegron ER (MYRBETRIQ) 25 MG TB24 tablet [id number] Yes Take 1 tablet by mouth daily. [provider] Taking Active   Multiple Vitamin (MULTIVITAMIN WITH  MINERALS) TABS tablet [id number] Yes Take 1 tablet by mouth daily. [provider] Taking Active   omeprazole (PRILOSEC) 20 MG capsule [id number] Yes Take 1 capsule (20 mg total) by mouth daily. Mosie Lukes, MD Taking Active   pentosan polysulfate (ELMIRON) 100 MG capsule [id number] Yes Take 100 mg by mouth as directed. two at morning and two at night Dara Lords, Alexzandrew L, PA-C Taking Active Self  rosuvastatin (CRESTOR) 20 MG tablet [id number] Yes TAKE 1 TABLET AT BEDTIME Lelon Perla, MD Taking Active   telmisartan (MICARDIS) 40 MG tablet [id number] Yes Take 1 tablet (40 mg total) by mouth daily. Lelon Perla, MD Taking Active   venlafaxine XR (EFFEXOR-XR) 75 MG 24 hr capsule [id number] Yes Take 3 capsules (225 mg total) by mouth daily with breakfast. 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   Right knee pain 07/10/2020   Urinary frequency 02/11/2020   Debility 12/23/2017   Left knee pain 12/23/2017   Vitamin D deficiency 06/14/2017   Vertebral fracture, osteoporotic 04/05/2016   Left-sided back pain 03/06/2016   Chest pain 02/27/2016   Syncope 02/27/2016   Cataracts, bilateral 12/28/2015   Recurrent falls 12/28/2015   Family history- stomach cancer 09/08/2015   Family history of cancer 09/08/2015   [name] lesion 06/21/2015   Obesity 09/28/2014   Interstitial cystitis 09/28/2014   Pain in joint, ankle and foot 09/28/2014   Abnormal nuclear stress test 12/16/2013   Sternal pain 11/06/2013   Postoperative anemia due to acute blood loss 02/13/2013   Congestive dilated cardiomyopathy ([place]) 12/30/2012   Breast cancer of lower-inner quadrant of right female breast  10/21/2012   Constipation 03/03/2012   OA (osteoarthritis) 09/07/2011   Dermatitis    Hyponatremia 08/28/2011   Osteoporosis 03/07/2011  Prediabetes 06/15/2009   Lipoma 02/10/2009   Dyspnea on exertion 02/10/2009   Hyperlipidemia, mixed 08/11/2008   Major depressive disorder with anxious distress 08/11/2008   Essential hypertension 08/11/2008   GERD (gastroesophageal reflux disease) 08/11/2008   Craniopharyngioma 2000    Immunization History  Administered Date(s) Administered   Fluad Quad(high Dose 65+) 10/14/2018, 12/02/2020   Influenza Split 11/11/2010, 11/14/2011   Influenza Whole 02/10/2009   Influenza, High Dose Seasonal PF 11/04/2015, 10/16/2017, 11/20/2019   Influenza,inj,Quad PF,6+ Mos 10/18/2012, 10/10/2013, 12/21/2014   Influenza-Unspecified 08/30/2016   PFIZER Comirnaty(Gray Top)Covid-19 Tri-Sucrose Vaccine 07/05/2020   PFIZER(Purple Top)SARS-COV-2 Vaccination 03/30/2019, 04/29/2019, 11/20/2019   Pfizer Covid-19 Vaccine Bivalent Booster 49yr & up 10/28/2020   Pneumococcal Conjugate-13 12/21/2014   Pneumococcal Polysaccharide-23 09/04/2011   Td 01/30/2001, 12/28/2015   Zoster Recombinat (Shingrix) 05/09/2016    Conditions to be addressed/monitored: HTN, HLD, Anxiety, Depression, Dementia, GERD, Osteoporosis, and interstitial cystitis  Care Plan : General Pharmacy (Adult)  Updates made by ECherre Robins RPH-CPP since 02/02/2021 12:00 AM     Problem: Hyperlipidemia; hypertenstion; interstitial cystitis; osteopenia; tachycardia; GERD; alzheimers disease; depression / anxiety; pre diabetes; cardiomyopathy; history of breast cancer; osteoarthritis      Long-Range Goal: Provide education, support and care coordination for medication therapy and chronic conditions   Start Date: 12/14/2020  Priority: High  Note:   Current Barriers:  Unable to independently afford treatment regimen Unable to achieve control of hyperlipidemia or hypertension   Pharmacist Clinical  Goal(s):  Over the next 90 days, patient will verbalize ability to afford treatment regimen achieve control of hyperlipidemia and hypertension  as evidenced by LDL <70 and blood pressure <140/90 adhere to prescribed medication regimen as evidenced by refill history  through collaboration with PharmD and provider.   Interventions: 1:1 collaboration with BMosie Lukes MD regarding development and update of comprehensive plan of care as evidenced by provider attestation and co-signature Inter-disciplinary care team collaboration (see longitudinal plan of care) Comprehensive medication review performed; medication list updated in electronic medical record  Hypertension / Tachycardia Improving; blood pressure goal <140/90 Current treatment: Carvedilol 279mtwice a day (also helps with heart rate) Diltiazem 12031maily (also helps with heart rate)  Telmisartan 58m16mily (restarted 12/13/2020) Current home readings: mostly 130 - 140 / 70's but has had 1 or 2 readings 150 to 160 / 90 Denies hypotensive/hypertensive symptoms Patient has started using Kardia (Alivecor) card to monitor HR as recommended by Dr CrenStanford Breednterventions:  Discussed blood pressure goal and importance of blood pressure control in heart and kidney health.  Discussed when was best time to take medications for blood pressure  Continue current blood pressure regimen   Hyperlipidemia: Uncontrolled but close to goal; LDL goal <70 Current treatment:  Rosuvastatin 20mg18mly at bedtime Current dietary patterns: not following any particular diet.  Interventions:  Discussed limiting intake of saturated and trans fat - follow heart health diet.  ContaJohnsononfirm last rosuvastatin refill - patient filled 90 days suppoy on 08/14/2020 and 10/22/2020. Continue rosuvastatin. Could consider increasing to 58mg 21muture if LDL remains over 70.  Interstitial [facility]stit[facility]: [facility] Dr  Evans Amalia Haileyr S. TanChauncey Cruelenbaum  Currently stable, but notes that cost of Elmiron has recently increased due to being in Medicare Coverage gap. Mailed application for Elmiron patient assistance program in November 2022 but she was concerned that her 2021 financials would disqualify her for assistance since she sold some property in 2021. She  is planning to apply in 2023 after she files 2022 taxes. Has appt for cystoscopy 02/11/2021 Current therapy:  Elmiron 154m twice a day Hydroxyzine 278m- take 12.5 to 2574mp to twice a day if needed.  Interventions:  Patient will completed tax return for 2022 and then complete patient assistance program application for Elmiron. Continue current medication and follow up with Dr EvaAmalia Hailey Health Maintenance:  Reviewed vaccination history and discussed benefits of Shingrix vaccination Patient considering Shingrix in 2023  Medication management Pharmacist Clinical Goal(s): Over the next 90 days, patient will work with PharmD and providers to maintain optimal medication adherence [facility] Patient reports she has been unable to find distilled water needed for her CPAP machine.  Patient is not taking gabapentin for neuropathy - she has appointment with neurology for testing 02/03/2021 Interventions Comprehensive medication review performed. Reviewed refill history and assessed adherence ([facility] was not up to date with refill history - [facility] recent [facility] and adherence)  Continue current medication management strategy [facility] for CPAP humidifier.  Patient self care activities - Over the next 90 days, patient will: Focus on medication adherence by filling medications appropriately  Take medications as prescribed Report any questions or concerns to PharmD and/or provider(s)  Patient Goals/Self-Care Activities Over  the next 90 days, patient will:  take medications as prescribed,  focus on medication adherence by using weekly medication boxes / reminders,  check blood pressure 2 to 3 times per week, document, and provide at future appointments, and  collaborate with provider on medication access solutions  Follow Up Plan: Telephone follow up appointment with care management team member scheduled for:  2 months         Medication Assistance:  Resent patient application for Elmiron patient assistance.  Orginally mailed 12/14/2020. Today sent link to patient's mychart.  [facility]  [id number] [id number]: [phone]: [phone] [id number]: [phone]: [phone] pill box? Yes - daughter is a nursMarine scientist she helps with patient's weekly medication boxes.  Pt endorses 98% compliance  Follow Up:  Patient agrees to Care Plan and Follow-up.  Plan: Telephone follow up appointment with care management team member scheduled for:  2 months   TammCherre RobinsarmD Clinical Pharmacist [facility]

## 2021-02-02 NOTE — Patient Instructions (Signed)
Shannon [name] It was a pleasure speaking with you today.  I have attached a summary of our visit today and information about your health goals.   Our next appointment is by telephone on Mar 24, 2021 at 1:45pm  Please call the care guide team at [phone] if you need to cancel or reschedule your appointment.   If you have any questions or concerns, please feel free to contact me either at the phone number below or with a MyChart message.   Keep up the good work!  Shannon [name], PharmD Clinical Pharmacist [facility] [phone] (direct line)  [phone] (main office number)   Care Plan:   Hypertension / Tachycardia Uncontrolled - home blood pressure improved since telmisartan added back; blood pressure goal <140/90 BP Readings from Last 3 Encounters:  01/13/21 (!) 144/69  01/12/21 124/75  12/29/20 114/64    Current treatment: Carvedilol 25mg  twice a day (also helps with heart rate) Diltiazem 120mg  daily (also helps with heart rate)  Telmisartan 40mg  daily (restarted 12/13/2020) Interventions:  Discussed blood pressure goal and importance of blood pressure control in heart and kidney health.  Discussed when was best time to take medications for blood pressure  Recommend checking blood pressure 2 to 3 times per week. Record for future visits.  Hyperlipidemia: Uncontrolled but close to goal LDL goal <70 Lipid Panel     Component Value Date/Time   Total Cholesterol  152 10/28/2020 1214   Triglycerides 207.0 (H) 10/28/2020 1214   HDLC (good cholesterol) 52.20 10/28/2020 1214   LDLC (bad cholesterol) 78.0 10/28/2020 1214   Current treatment:  Rosuvastatin 20mg  daily at bedtime Current dietary patterns: not following any particular diet.  Interventions:  Discussed limiting intake of saturated and trans fat - follow heart health diet.  Continue rosuvastatin.  Interstitial Cystitis: Managed by [facility] - Dr  Amalia Hailey and Dr Chauncey Cruel. [name]  Currently stable, but notes that cost of Elmiron has recently increased due to being in Medicare Coverage gap Current therapy:  Elmiron 100mg  twice a day Hydroxyzine 25mg  - take 12.5 to 25mg  up to twice a day if needed.  Interventions:  Patient will completed tax return for 2022 and then complete patient assistance program application for Elmiron. Continue current medication and follow up with Dr Amalia Hailey.     Health Maintenance:  Reviewed vaccination history and discussed benefits of Shingrix vaccination Patient considering Shingrix in 2023   Medication management Pharmacist Clinical Goal(s): Over the next 90 days, patient will work with PharmD and providers to maintain optimal medication adherence [facility] Patient reports she has been unable to find distilled water needed for her CPAP machine.  Patient is not taking gabapentin for neuropathy - she has appointment with neurology for testing 02/03/2021 Interventions Comprehensive medication review performed. Reviewed refill history and assessed adherence (EMR was not up to date with refill history - coordinated with [place] to verify recent refills and adherence)  Continue current medication management strategy Coordinated with pharmacy at [facility] [place] to get patient distilled water for CPAP humidifier.  Patient self care activities - Over the next 90 days, patient will: Focus on medication adherence by filling medications appropriately  Take medications as prescribed Report any questions or concerns to PharmD and/or provider(s)  Patient Goals/Self-Care Activities Over the next 90 days, patient will:  take medications as prescribed,  focus on medication adherence by using weekly medication boxes / reminders,  check  blood pressure 2 to 3 times per week, document, and provide at future appointments, and  collaborate with provider on medication access  solutions  Follow Up Plan: Telephone follow up appointment with care management team member scheduled for:  2 months  Patient verbalizes understanding of instructions provided today and agrees to view in [place].

## 2021-02-03 ENCOUNTER — Other Ambulatory Visit: Payer: Self-pay

## 2021-02-03 ENCOUNTER — Ambulatory Visit (INDEPENDENT_AMBULATORY_CARE_PROVIDER_SITE_OTHER): Payer: Medicare Other | Admitting: Neurology

## 2021-02-03 DIAGNOSIS — R202 Paresthesia of skin: Secondary | ICD-10-CM

## 2021-02-03 DIAGNOSIS — G309 Alzheimer's disease, unspecified: Secondary | ICD-10-CM | POA: Diagnosis not present

## 2021-02-03 DIAGNOSIS — F0670 Mild neurocognitive disorder due to known physiological condition without behavioral disturbance: Secondary | ICD-10-CM

## 2021-02-03 DIAGNOSIS — G629 Polyneuropathy, unspecified: Secondary | ICD-10-CM

## 2021-02-03 NOTE — Procedures (Signed)
[facility]  [place] [place]  [place], [place] [address] Tel: [phone] Fax:  [phone] Test Date:  02/03/2021  Patient: Shannon [name] DOB: May 24, 1945 Physician: Narda Amber, DO  Sex: Female Height: 5\' 2"  Ref Phys: Metta Clines, D.O.  ID#: [id number]   Technician:    Patient Complaints: This is a 76 year old female referred for evaluation of bilateral feet paresthesias  NCV & EMG Findings: Extensive electrodiagnostic testing of the right lower extremity and additional studies of the left shows:  Right superficial peroneal and left sural sensory responses are absent.  Right sural and left superficial peroneal sensory responses are within normal limits. Bilateral peroneal motor responses show reduced amplitude at the extensor digitorum brevis, and is normal at the tibialis anterior.  Bilateral tibial motor responses are within normal limits. Bilateral tibial H reflex studies are within normal limits. There is no evidence of active or chronic motor axonal loss changes affecting any of the tested muscles.  Motor unit configuration and recruitment pattern is within normal limits.  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  Overall, these findings are moderate in degree electrically.   ___________________________ Narda Amber, DO    Nerve Conduction Studies Anti Sensory Summary Table   Stim Site NR Peak (ms) Norm Peak (ms) P-T Amp (V) Norm P-T Amp  Left Sup Peroneal Anti Sensory (Ant Lat Mall)  33C  12 cm    2.9 <4.6 3.6 >3  Right Sup Peroneal Anti Sensory (Ant Lat Mall)  33C  12 cm NR  <4.6  >3  Left Sural Anti Sensory (Lat Mall)  33C  Calf NR  <4.6  >3  Right Sural Anti Sensory (Lat Mall)  33C  Calf    3.0 <4.6 5.9 >3   Motor Summary Table   Stim Site NR Onset (ms) Norm Onset (ms) O-P Amp (mV) Norm O-P Amp Site1 Site2 Delta-0 (ms) Dist (cm) Vel (m/s) Norm  Vel (m/s)  Left Peroneal Motor (Ext Dig Brev)  33C  Ankle    4.4 <6.0 1.4 >2.5 B Fib Ankle 7.3 38.0 52 >40  B Fib    11.7  1.1  Poplt B Fib 1.6 8.0 50 >40  Poplt    13.3  1.2         Right Peroneal Motor (Ext Dig Brev)  33C  Ankle    3.8 <6.0 2.0 >2.5 B Fib Ankle 7.8 37.0 47 >40  B Fib    11.6  1.8  Poplt B Fib 1.8 8.0 44 >40  Poplt    13.4  1.6         Left Peroneal TA Motor (Tib Ant)  33C  Fib Head    2.5 <4.5 3.2 >3 Poplit Fib Head 1.5 7.0 47 >40  Poplit    4.0  2.9         Right Peroneal TA Motor (Tib Ant)  33C  Fib Head    2.6 <4.5 3.2 >3 Poplit Fib Head 1.4 8.0 57 >40  Poplit    4.0  3.2         Left Tibial Motor (Abd Hall Brev)  33C  Ankle    3.9 <6.0 7.9 >4 Knee Ankle 8.7 41.0 47 >40  Knee    12.6  6.2         Right Tibial Motor (Abd Hall Brev)  33C  Ankle    5.2 <6.0 10.8 >4 Knee Ankle 6.9 41.0 59 >  40  Knee    12.1  7.3          H Reflex Studies   NR H-Lat (ms) Lat Norm (ms) L-R H-Lat (ms)  Left Tibial (Gastroc)  33C     31.16 <35 0.95  Right Tibial (Gastroc)  33C     32.11 <35 0.95   EMG   Side Muscle Ins Act Fibs Psw Fasc Number Recrt Dur Dur. Amp Amp. Poly Poly. Comment  Right AntTibialis Nml Nml Nml Nml Nml Nml Nml Nml Nml Nml Nml Nml N/A  Right Gastroc Nml Nml Nml Nml Nml Nml Nml Nml Nml Nml Nml Nml N/A  Right Flex Dig Long Nml Nml Nml Nml Nml Nml Nml Nml Nml Nml Nml Nml N/A  Right RectFemoris Nml Nml Nml Nml Nml Nml Nml Nml Nml Nml Nml Nml N/A  Right GluteusMed Nml Nml Nml Nml Nml Nml Nml Nml Nml Nml Nml Nml N/A  Left AntTibialis Nml Nml Nml Nml Nml Nml Nml Nml Nml Nml Nml Nml N/A  Left Gastroc Nml Nml Nml Nml Nml Nml Nml Nml Nml Nml Nml Nml N/A  Left Flex Dig Long Nml Nml Nml Nml Nml Nml Nml Nml Nml Nml Nml Nml N/A  Left RectFemoris Nml Nml Nml Nml Nml Nml Nml Nml Nml Nml Nml Nml N/A  Left GluteusMed Nml Nml Nml Nml Nml Nml Nml Nml Nml Nml Nml Nml N/A      Waveforms:

## 2021-02-04 NOTE — Progress Notes (Signed)
Pt advised of her EMG results and labs results. Pt wanted to know what the next steps will be? She also c/o numbness and tingling in her arms.

## 2021-02-08 ENCOUNTER — Ambulatory Visit: Payer: Medicare Other | Admitting: Cardiology

## 2021-02-09 DIAGNOSIS — M25551 Pain in right hip: Secondary | ICD-10-CM | POA: Diagnosis not present

## 2021-02-09 DIAGNOSIS — M545 Low back pain, unspecified: Secondary | ICD-10-CM | POA: Diagnosis not present

## 2021-02-09 DIAGNOSIS — M7061 Trochanteric bursitis, right hip: Secondary | ICD-10-CM | POA: Diagnosis not present

## 2021-02-09 HISTORY — DX: Pain in right hip: M25.551

## 2021-02-09 NOTE — Progress Notes (Signed)
LMOVM for the patient to call the office back.

## 2021-02-10 ENCOUNTER — Ambulatory Visit: Payer: Medicare Other | Admitting: *Deleted

## 2021-02-10 ENCOUNTER — Encounter: Payer: Self-pay | Admitting: *Deleted

## 2021-02-10 DIAGNOSIS — E669 Obesity, unspecified: Secondary | ICD-10-CM

## 2021-02-10 DIAGNOSIS — E782 Mixed hyperlipidemia: Secondary | ICD-10-CM

## 2021-02-10 DIAGNOSIS — M810 Age-related osteoporosis without current pathological fracture: Secondary | ICD-10-CM

## 2021-02-10 DIAGNOSIS — I10 Essential (primary) hypertension: Secondary | ICD-10-CM

## 2021-02-10 DIAGNOSIS — F418 Other specified anxiety disorders: Secondary | ICD-10-CM

## 2021-02-10 DIAGNOSIS — D444 Neoplasm of uncertain behavior of craniopharyngeal duct: Secondary | ICD-10-CM

## 2021-02-10 DIAGNOSIS — G309 Alzheimer's disease, unspecified: Secondary | ICD-10-CM

## 2021-02-10 DIAGNOSIS — F0670 Mild neurocognitive disorder due to known physiological condition without behavioral disturbance: Secondary | ICD-10-CM

## 2021-02-10 DIAGNOSIS — M7061 Trochanteric bursitis, right hip: Secondary | ICD-10-CM | POA: Diagnosis not present

## 2021-02-10 DIAGNOSIS — R202 Paresthesia of skin: Secondary | ICD-10-CM

## 2021-02-10 DIAGNOSIS — F339 Major depressive disorder, recurrent, unspecified: Secondary | ICD-10-CM

## 2021-02-10 NOTE — Progress Notes (Signed)
LMOVM For pt to give [place] call back in regards to her questions.

## 2021-02-10 NOTE — Patient Instructions (Signed)
Visit Information  Thank you for taking time to visit with me today. Please don't hesitate to contact me if I can be of assistance to you before our next scheduled telephone appointment.  Following are the goals we discussed today:  Patient Goals/Self-Care Activities: Continue to receive personal counseling with LCSW, on a bi-weekly basis, to reduce and manage symptoms of Anxiety and Depression, until re-established with Clint Bolder, LCSW with [facility] [phone]).   LCSW collaboration with Clint Bolder, LCSW with [facility] (Fax # [phone]) to schedule your follow-up appointment, in an effort to re-establish services. ~ To re-establish services with Clint Bolder, LCSW, a new order for counseling services needs to be faxed to [facility] (fax # [phone]) by Primary Care Physician, Dr. Penni Homans. LCSW collaboration with Primary Care Physician, Dr. Penni Homans to request order for counseling services be faxed to [facility], providing fax number.   Continue to incorporate into daily practice - relaxation techniques, deep breathing exercises and mindfulness meditation strategies, referring back to [facility] Educational Material, if necessary. ~ Relaxation Techniques for Depression. ~ Stress Management - Breathing Exercises for Relaxation. ~ 12 Common Symptoms of Depression That Should Not Be Ignored Consider self-enrollment in a Dementia Support Group, both on-line and in-person, from the list provided.  ~ [place] in [place]. ~ [place]. ~ Alzheimer's Association Support Groups in [facility]. ~ [place]. Contact LCSW directly (# [id number]) if you have questions, need assistance, or if additional social work needs are identified between now and our next scheduled telephone  outreach call.  Follow-Up:  02/24/2021 at 2:45 pm  Please call the care guide team at [phone] if you need to cancel or reschedule your appointment.   If you are experiencing a Mental Health or [place] or need someone to talk to, please call the Suicide and Crisis Lifeline: 988 call the [place] National Suicide Prevention Lifeline: 865-487-4653 or TTY: 9712203251 TTY 413-522-7293) to talk to a trained counselor call 1-800-273-TALK (toll free, 24 hour hotline) go to [facility] [address], [place] [phone]) call the [place]: [phone] call 911   Patient verbalizes understanding of instructions and care plan provided today and agrees to view in [place]. Active MyChart status confirmed with patient.    [place] Licensed Clinical Social Worker [place] [phone]

## 2021-02-10 NOTE — Chronic Care Management (AMB) (Signed)
Chronic Care Management    Clinical Social Work Note  02/10/2021 Name: Shannon [name] MRN: [id number] DOB: [birth year]  Shannon [name] is a 76 y.o. year old female who is a primary care patient of Mosie Lukes, MD. The CCM team was consulted to assist the patient with chronic disease management and/or care coordination needs related to: Appointment Scheduling Needs, [employer], and Mental Health Counseling and Resources.   Engaged with patient by telephone for follow up visit in response to provider referral for social work chronic care management and care coordination services.   Consent to Services:  The patient was given information about Chronic Care Management services, agreed to services, and gave verbal consent prior to initiation of services.  Please see initial visit note for detailed documentation.   Patient agreed to services and consent obtained.   Assessment: Review of patient past medical history, allergies, medications, and health status, including review of relevant consultants reports was performed today as part of a comprehensive evaluation and provision of chronic care management and care coordination services.     SDOH (Social Determinants of Health) assessments and interventions performed:    Advanced Directives Status: Not addressed in this encounter.  CCM Care Plan  Allergies  Allergen Reactions   Dilaudid [Hydromorphone Hcl] Itching    Outpatient Encounter Medications as of 02/10/2021  Medication Sig   acetaminophen (TYLENOL) 500 MG tablet Take 1-2 tablets (500-1,000 mg total) by mouth every 8 (eight) hours as needed for moderate pain.   albuterol (VENTOLIN HFA) 108 (90 Base) MCG/ACT inhaler Inhale 1-2 puffs into the lungs every 6 (six) hours as needed for wheezing or shortness of breath.   Calcium Carbonate (CALCIUM 600 PO) Take 1 tablet by mouth in the morning and at bedtime.   carvedilol (COREG) 25 MG tablet TAKE 1 TABLET TWICE DAILY WITH A MEAL    cetirizine (ZYRTEC) 10 MG tablet Take 10 mg by mouth daily.   diltiazem (CARDIZEM CD) 120 MG 24 hr capsule Take 1 capsule (120 mg total) by mouth daily.   docusate sodium (COLACE) 250 MG capsule Take 250 mg by mouth daily as needed for constipation.   donepezil (ARICEPT) 10 MG tablet TAKE 1 TABLET AT BEDTIME   ergocalciferol (VITAMIN D2) 1.25 MG (50000 UT) capsule Take 2,000 Units by mouth daily.   famotidine (PEPCID) 20 MG tablet TAKE 1 TABLET BY MOUTH TWICE A DAY   ferrous sulfate 325 (65 FE) MG tablet Take 325 mg by mouth daily with breakfast.   folic acid (FOLVITE) 1 MG tablet Take 1 tablet (1 mg total) by mouth daily.   gabapentin (NEURONTIN) 300 MG capsule Take 1 capsule (300 mg total) by mouth 2 (two) times daily as needed. (Patient not taking: Reported on 02/02/2021)   hydrOXYzine (ATARAX/VISTARIL) 25 MG tablet Take 0.5-1 tablets (12.5-25 mg total) by mouth 2 (two) times daily as needed.   Melatonin 10 MG CAPS Take 1 capsule by mouth at bedtime.   mirabegron ER (MYRBETRIQ) 25 MG TB24 tablet Take 1 tablet by mouth daily.   Multiple Vitamin (MULTIVITAMIN WITH MINERALS) TABS tablet Take 1 tablet by mouth daily.   omeprazole (PRILOSEC) 20 MG capsule Take 1 capsule (20 mg total) by mouth daily.   pentosan polysulfate (ELMIRON) 100 MG capsule Take 100 mg by mouth as directed. two at morning and two at night   rosuvastatin (CRESTOR) 20 MG tablet TAKE 1 TABLET AT BEDTIME   telmisartan (MICARDIS) 40 MG tablet Take 1 tablet (40 mg  total) by mouth daily.   venlafaxine XR (EFFEXOR-XR) 75 MG 24 hr capsule Take 3 capsules (225 mg total) by mouth daily with breakfast.   No facility-administered encounter medications on file as of 02/10/2021.    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   Right knee pain 07/10/2020   Urinary frequency 02/11/2020   Debility 12/23/2017   Left knee pain 12/23/2017   Vitamin D deficiency 06/14/2017   Vertebral fracture, osteoporotic 04/05/2016   Left-sided back pain 03/06/2016   Chest pain 02/27/2016   Syncope 02/27/2016   Cataracts, bilateral 12/28/2015   Recurrent falls 12/28/2015   Family history- stomach cancer 09/08/2015   Family history of cancer 09/08/2015   [name] lesion 06/21/2015   Obesity 09/28/2014   Interstitial cystitis 09/28/2014   Pain in joint, ankle and foot 09/28/2014   Abnormal nuclear stress test 12/16/2013   Sternal pain 11/06/2013   Postoperative anemia due to acute blood loss 02/13/2013   Congestive dilated cardiomyopathy ([place]) 12/30/2012   Breast cancer of lower-inner quadrant of right female breast 10/21/2012   Constipation 03/03/2012   OA (osteoarthritis) 09/07/2011   Dermatitis    Hyponatremia 08/28/2011   Osteoporosis 03/07/2011   Prediabetes 06/15/2009   Lipoma 02/10/2009   Dyspnea on exertion 02/10/2009   Hyperlipidemia, mixed 08/11/2008   Major depressive disorder with anxious distress 08/11/2008   Essential hypertension 08/11/2008   GERD (gastroesophageal reflux disease) 08/11/2008   Craniopharyngioma 2000    Conditions to be addressed/monitored: Depression, Anxiety, and Dementia.  Mental Health Concerns, Social Isolation, Cognitive Deficits, Memory Deficits, and Lacks Knowledge of [employer].  Care Plan : LCSW Plan of Care  Updates made by Francis Gaines, LCSW since 02/10/2021 12:00 AM     Problem: Reduce and Manage My Symptoms of Anxiety and Depression.   Priority: High     Goal: Reduce and Manage My Symptoms of Anxiety and Depression.   Start Date: 12/30/2020  Expected End Date: 03/30/2021  This Visit's Progress: On track  Recent Progress: On track  Priority: High  Note:   Current Barriers:   Acute Mental Health needs related to  Debility, Obesity, Major Depressive Disorder with Anxious Distress, and Mild Neurocognitive Disorder due to Alzheimer's Disease, requires Support, Education, Resources, Referrals, Advocacy, and Care Coordination, in order to meet unmet mental health needs. Clinical Goal(s):  Patient will work with LCSW to reduce and manage symptoms of Anxiety and Depression, until re-established with a community mental health provider.     Patient will increase knowledge and/or ability of:        Coping Skills, Healthy Habits, Self-Management Skills, Stress Reduction, Home Safety and Utilizing [employer] and Resources.   Clinical Interventions:  Solution-Focused Therapy performed, Verbalization of Feelings encouraged, Emotional Support provided, Problem Solving Solutions developed, Brief Cognitive Behavioral Therapy initiated, Quality of Sleep assessed and Sleep Hygiene Techniques promoted, Psychotropic Medication Regimen reviewed and Compliance indicated, Support Group Participation encouraged and Increase Level of Activity/Exercise emphasized.    Collaboration with Primary Care Physician, Dr. Penni Homans regarding development and update of comprehensive plan of care as evidenced by provider attestation and co-signature. List of Dementia Support Groups provided, both on-line and in-person, via e-mail ([email or web address]) and mail. Inter-disciplinary care team collaboration (see longitudinal plan of  care). Patient Goals/Self-Care Activities: Continue to receive personal counseling with LCSW, on a bi-weekly basis, to reduce and manage symptoms of Anxiety and Depression, until re-established with Clint Bolder, LCSW with [facility] [phone]).   LCSW collaboration with Clint Bolder, LCSW with [facility] (Fax # [phone]) to schedule your follow-up appointment, in an effort to re-establish services. ~ To re-establish services with Clint Bolder, LCSW, a new order for  counseling services needs to be faxed to [facility] (fax # [phone]) by Primary Care Physician, Dr. Penni Homans. LCSW collaboration with Primary Care Physician, Dr. Penni Homans to request order for counseling services be faxed to [facility], providing fax number.   Continue to incorporate into daily practice - relaxation techniques, deep breathing exercises and mindfulness meditation strategies, referring back to [facility] Educational Material, if necessary. ~ Relaxation Techniques for Depression. ~ Stress Management - Breathing Exercises for Relaxation. ~ 12 Common Symptoms of Depression That Should Not Be Ignored Consider self-enrollment in a Dementia Support Group, both on-line and in-person, from the list provided.  ~ [place] in [place]. ~ [place]. ~ Alzheimer's Association Support Groups in [facility]. ~ [place]. Contact LCSW directly (# [id number]) if you have questions, need assistance, or if additional social work needs are identified between now and our next scheduled telephone outreach call.  Follow-Up:  02/24/2021 at 2:45 pm      Nat Christen LCSW Licensed Clinical Social Worker [place] [phone]

## 2021-02-11 DIAGNOSIS — M7918 Myalgia, other site: Secondary | ICD-10-CM | POA: Diagnosis not present

## 2021-02-11 DIAGNOSIS — Z452 Encounter for adjustment and management of vascular access device: Secondary | ICD-10-CM | POA: Diagnosis not present

## 2021-02-11 DIAGNOSIS — R350 Frequency of micturition: Secondary | ICD-10-CM | POA: Diagnosis not present

## 2021-02-11 DIAGNOSIS — Z711 Person with feared health complaint in whom no diagnosis is made: Secondary | ICD-10-CM | POA: Diagnosis not present

## 2021-02-11 DIAGNOSIS — N3010 Interstitial cystitis (chronic) without hematuria: Secondary | ICD-10-CM | POA: Diagnosis not present

## 2021-02-11 DIAGNOSIS — R3915 Urgency of urination: Secondary | ICD-10-CM | POA: Diagnosis not present

## 2021-02-14 ENCOUNTER — Other Ambulatory Visit: Payer: Self-pay | Admitting: Neurology

## 2021-02-14 ENCOUNTER — Telehealth: Payer: Self-pay | Admitting: Neurology

## 2021-02-14 MED ORDER — GABAPENTIN 300 MG PO CAPS: 300.0000 mg | ORAL_CAPSULE | Freq: Two times a day (BID) | ORAL | 5 refills | Status: DC

## 2021-02-14 NOTE — Telephone Encounter (Signed)
Patient advised Gabapentin 300 BID sent to her [facility].

## 2021-02-14 NOTE — Telephone Encounter (Signed)
Pt calling to get results for EMG.

## 2021-02-14 NOTE — Telephone Encounter (Signed)
Per pt she will like to restart Gabapentin per Dr.Jaffe result note.   Please send script to pharmacy on file.

## 2021-02-15 ENCOUNTER — Ambulatory Visit: Payer: Medicare Other

## 2021-02-15 DIAGNOSIS — E782 Mixed hyperlipidemia: Secondary | ICD-10-CM

## 2021-02-15 DIAGNOSIS — N3010 Interstitial cystitis (chronic) without hematuria: Secondary | ICD-10-CM

## 2021-02-15 NOTE — Chronic Care Management (AMB) (Signed)
Chronic Care Management   CCM RN Visit Note  02/15/2021 Name: Shannon [name] MRN: [id number] DOB: 06/12/1945  Subjective: Shannon [name] is a 76 y.o. year old female who is a primary care patient of Mosie Lukes, MD. The care management team was consulted for assistance with disease management and care coordination needs.    Engaged with patient by telephone for follow up visit in response to provider referral for case management and/or care coordination services.   Consent to Services:  The patient was given information about Chronic Care Management services, agreed to services, and gave verbal consent prior to initiation of services.  Please see initial visit note for detailed documentation.   Patient agreed to services and verbal consent obtained.   Assessment: Review of patient past medical history, allergies, medications, health status, including review of consultants reports, laboratory and other test data, was performed as part of comprehensive evaluation and provision of chronic care management services.   SDOH (Social Determinants of Health) assessments and interventions performed:    CCM Care Plan  Allergies  Allergen Reactions   Dilaudid [Hydromorphone Hcl] Itching    Outpatient Encounter Medications as of 02/15/2021  Medication Sig   acetaminophen (TYLENOL) 500 MG tablet Take 1-2 tablets (500-1,000 mg total) by mouth every 8 (eight) hours as needed for moderate pain.   albuterol (VENTOLIN HFA) 108 (90 Base) MCG/ACT inhaler Inhale 1-2 puffs into the lungs every 6 (six) hours as needed for wheezing or shortness of breath.   Calcium Carbonate (CALCIUM 600 PO) Take 1 tablet by mouth in the morning and at bedtime.   carvedilol (COREG) 25 MG tablet TAKE 1 TABLET TWICE DAILY WITH A MEAL   cetirizine (ZYRTEC) 10 MG tablet Take 10 mg by mouth daily.   diltiazem (CARDIZEM CD) 120 MG 24 hr capsule Take 1 capsule (120 mg total) by mouth daily.   docusate sodium (COLACE) 250 MG  capsule Take 250 mg by mouth daily as needed for constipation.   donepezil (ARICEPT) 10 MG tablet TAKE 1 TABLET AT BEDTIME   ergocalciferol (VITAMIN D2) 1.25 MG (50000 UT) capsule Take 2,000 Units by mouth daily.   famotidine (PEPCID) 20 MG tablet TAKE 1 TABLET BY MOUTH TWICE A DAY   ferrous sulfate 325 (65 FE) MG tablet Take 325 mg by mouth daily with breakfast.   folic acid (FOLVITE) 1 MG tablet Take 1 tablet (1 mg total) by mouth daily.   gabapentin (NEURONTIN) 300 MG capsule Take 1 capsule (300 mg total) by mouth 2 (two) times daily.   hydrOXYzine (ATARAX/VISTARIL) 25 MG tablet Take 0.5-1 tablets (12.5-25 mg total) by mouth 2 (two) times daily as needed.   Melatonin 10 MG CAPS Take 1 capsule by mouth at bedtime.   mirabegron ER (MYRBETRIQ) 25 MG TB24 tablet Take 1 tablet by mouth daily.   Multiple Vitamin (MULTIVITAMIN WITH MINERALS) TABS tablet Take 1 tablet by mouth daily.   omeprazole (PRILOSEC) 20 MG capsule Take 1 capsule (20 mg total) by mouth daily.   pentosan polysulfate (ELMIRON) 100 MG capsule Take 100 mg by mouth as directed. two at morning and two at night   rosuvastatin (CRESTOR) 20 MG tablet TAKE 1 TABLET AT BEDTIME   telmisartan (MICARDIS) 40 MG tablet Take 1 tablet (40 mg total) by mouth daily.   venlafaxine XR (EFFEXOR-XR) 75 MG 24 hr capsule Take 3 capsules (225 mg total) by mouth daily with breakfast.   No facility-administered encounter medications on file as of 02/15/2021.  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   Right knee pain 07/10/2020   Urinary frequency 02/11/2020   Debility 12/23/2017   Left knee pain 12/23/2017   Vitamin D deficiency 06/14/2017   Vertebral fracture, osteoporotic 04/05/2016   Left-sided  back pain 03/06/2016   Chest pain 02/27/2016   Syncope 02/27/2016   Cataracts, bilateral 12/28/2015   Recurrent falls 12/28/2015   Family history- stomach cancer 09/08/2015   Family history of cancer 09/08/2015   [name] lesion 06/21/2015   Obesity 09/28/2014   Interstitial cystitis 09/28/2014   Pain in joint, ankle and foot 09/28/2014   Abnormal nuclear stress test 12/16/2013   Sternal pain 11/06/2013   Postoperative anemia due to acute blood loss 02/13/2013   Congestive dilated cardiomyopathy ([place]) 12/30/2012   Breast cancer of lower-inner quadrant of right female breast 10/21/2012   Constipation 03/03/2012   OA (osteoarthritis) 09/07/2011   Dermatitis    Hyponatremia 08/28/2011   Osteoporosis 03/07/2011   Prediabetes 06/15/2009   Lipoma 02/10/2009   Dyspnea on exertion 02/10/2009   Hyperlipidemia, mixed 08/11/2008   Major depressive disorder with anxious distress 08/11/2008   Essential hypertension 08/11/2008   GERD (gastroesophageal reflux disease) 08/11/2008   Craniopharyngioma 2000    Conditions to be addressed/monitored:HLD, Anxiety, Depression, and Mild Neurocognitive Disorder, Interstitial Cystitis  Care Plan : RN Case Manager Plan of Care  Updates made by Luretha Rued, RN since 02/15/2021 12:00 AM     Problem: No Plan of Care Established for Managment of Chronic Disease (Neurocognitive Disorder, Anxiety/Depression)   Priority: High     Long-Range Goal: Development of Plan of Care for Chronic Disease Management (Neurocognitive Disorder, Anxiety/Depression)   Start Date: 12/13/2020  Expected End Date: 03/15/2021  Priority: High  Note:   Current Barriers: Recent diagnosis of mild neurocognitive disorder due to Alzheimer's disease. Mrs Mcbain reports she has supportive family. Lives with husband. She also reports her daughter is supportive. Cystocopy with hydrodistention completed on 02/11/21 for interstitial cystitis. She reports not problems; denies any  bleeding or pain, but reports she "just feels tired". Currently working with LCSW to assist with mental health needs.   Knowledge Deficits related to plan of care for management of neurocognitive disorder, anxiety/depression  Cognitive Deficits/Memory loss-reports strategies for managing memory loss, keeping notes, writing things down. Medication management-improved. Daughter assist with medication management.   RNCM Clinical Goal(s):  Patient will verbalize understanding of plan for management of Anxiety, Depression, and Dementia as evidenced by taking medications as prescribed, following up with provider appointments as schedule, engaging with LCSW and/or referred specialist. take all medications exactly as prescribed and will call provider for medication related questions as evidenced by medication adherance    work with pharmacist to address medications assistance program if available for [place] and follow up on medication managment related to chronic disease as evidenced by review of EMR and patient or pharmacist report    work with Education officer, museum to address Mental Health Concerns , Cognitive Deficits, and Memory Deficits related to the management of Anxiety and Depression as evidenced by review of EMR and patient or social worker report     through collaboration with Consulting civil engineer, provider, and care team.   Interventions: 1:1 collaboration with primary care provider  regarding development and update of comprehensive plan of care as evidenced by provider attestation and co-signature Inter-disciplinary care team collaboration (see longitudinal plan of care) Evaluation of current treatment plan related to  self management and patient's adherence to plan as established by provider  Interstitial Cystitis  (Status:  Goal on track:  Yes.)  Long Term Goal Evaluation of current treatment plan related to interstitial cystitis and patient's adherence to plan as established by provider  Medications  reviewed and encouraged to continue to take as prescribed. Contact provider with any questions or concerns. Reviewed post cystoscopy discharge instructions, reiterated signs/symptoms to call the doctor  Hyperlipidemia Interventions:  (Status:  Goal on track:  Yes.) Long Term Goal Medication review performed; medication list updated in electronic medical record.  Discussed diet reiterated importance of eating healthy    Dementia InterventionsGoal on track:  Yes. Long Term Evaluation of current treatment plan related to Dementia, Memory Deficits self-management and patient's adherence to plan as established by provider. Reviewed support group resources previously provided and encouraged patient to contact. Discussed activities patient enjoys doing and encouraged her to participate in those activities strategies.  Encouraged to engage in exercise program such as walking, or senior exercise programs at parks and recreation, once cleared by providers.  Reiterated importance of eating a healthy diet Discussed plans with patient for ongoing care management follow up and provided patient with direct contact information for care management team Discussed Elder [employer] firm regarding estate planning   Anxiety/Depression InterventionsGoal on track:  Yes. Long term Evaluation of current treatment plan related to Anxiety and Depression, Memory Deficits self-management and patient's adherence to plan as established by provider. Provided support and allowed Ms. Nevers to express her thoughts about diagnosis regarding dementia diagnosis Encouraged to continue to work with LCSW Medications reviewed and encouraged to take as prescribed. Discussed importance of socialization and participation in activities.    Patient Goals/Self-Care Activities: Review your list and contact a local support group of your choice Take medications as prescribed   Attend all scheduled provider appointments Call provider office for new  concerns or questions  Continue to Eat Healthy: fruits, vegetables, lean meat, healthy fats. Avoid: saturated fats found in red meat and full-fat dairy products; fried foods, processed meats.  Write down a few goals (short term and long term) that you would like to accomplish.  Remain active. Do some type of activity you enjoy every day. Plan your exercise per provider recommendations. Continue to work with Licensed clinical social worker and clinical pharmacist   Plan:Telephone follow up appointment with care management team member scheduled for:  03/18/21 The patient has been provided with contact information for the care management team and has been advised to call with any health related questions or concerns.   Thea Silversmith, RN, MSN, BSN, CCM Care Management Coordinator [facility] [phone]

## 2021-02-15 NOTE — Patient Instructions (Signed)
Visit Information  Thank you for taking time to visit with me today. Please don't hesitate to contact me if I can be of assistance to you before our next scheduled telephone appointment.  Following are the goals we discussed today:  Patient Goals/Self-Care Activities: Review your list and contact a local support group of your choice Take medications as prescribed   Attend all scheduled provider appointments Call provider office for new concerns or questions  Continue to Eat Healthy: fruits, vegetables, lean meat, healthy fats. Avoid: saturated fats found in red meat and full-fat dairy products; fried foods, processed meats.  Write down a few goals (short term and long term) that you would like to accomplish.  Remain active. Do some type of activity you enjoy every day. Plan your exercise per provider recommendations. Continue to work with Licensed clinical social worker and clinical pharmacist  Our next appointment is by telephone on 03/18/21 at 2:00 pm  Please call the care guide team at [phone] if you need to cancel or reschedule your appointment.   If you are experiencing a Mental Health or [place] or need someone to talk to, please call the Suicide and Crisis Lifeline: 988 call 1-800-273-TALK (toll free, 24 hour hotline)   Patient verbalizes understanding of instructions and care plan provided today and agrees to view in [place]. Active MyChart status confirmed with patient.    Thea Silversmith, RN, MSN, BSN, CCM Care Management Coordinator [place] [phone]

## 2021-02-16 ENCOUNTER — Other Ambulatory Visit: Payer: Self-pay | Admitting: Family Medicine

## 2021-02-24 ENCOUNTER — Telehealth: Payer: Medicare Other

## 2021-02-24 ENCOUNTER — Ambulatory Visit: Payer: Medicare Other | Admitting: *Deleted

## 2021-02-24 ENCOUNTER — Other Ambulatory Visit: Payer: Self-pay | Admitting: Family Medicine

## 2021-02-24 DIAGNOSIS — F418 Other specified anxiety disorders: Secondary | ICD-10-CM

## 2021-02-24 DIAGNOSIS — E782 Mixed hyperlipidemia: Secondary | ICD-10-CM

## 2021-02-24 DIAGNOSIS — D444 Neoplasm of uncertain behavior of craniopharyngeal duct: Secondary | ICD-10-CM

## 2021-02-24 DIAGNOSIS — G309 Alzheimer's disease, unspecified: Secondary | ICD-10-CM

## 2021-02-24 DIAGNOSIS — E669 Obesity, unspecified: Secondary | ICD-10-CM

## 2021-02-24 DIAGNOSIS — R202 Paresthesia of skin: Secondary | ICD-10-CM

## 2021-02-24 DIAGNOSIS — R296 Repeated falls: Secondary | ICD-10-CM

## 2021-02-24 DIAGNOSIS — F339 Major depressive disorder, recurrent, unspecified: Secondary | ICD-10-CM

## 2021-02-24 DIAGNOSIS — M810 Age-related osteoporosis without current pathological fracture: Secondary | ICD-10-CM

## 2021-02-24 NOTE — Patient Instructions (Signed)
Visit Information  Thank you for taking time to visit with me today. Please don't hesitate to contact me if I can be of assistance to you before our next scheduled telephone appointment.  Following are the goals we discussed today:  Patient Goals/Self-Care Activities: Continue to receive personal counseling with LCSW, on a bi-weekly basis, to reduce and manage symptoms of Anxiety and Depression, until re-established with Clint Bolder, LCSW with [place] [phone]).   LCSW collaboration with Clint Bolder, LCSW with [place] [phone]), to schedule your follow-up appointment, in an effort to re-establish services.  ~ HIPAA compliant messages left with receptionist for Clint Bolder, LCSW with [place], as LCSW continues to await a return call. ~ To re-establish services with Clint Bolder, LCSW, a new order for counseling services needs to be faxed to [place] (fax # [phone]) by Primary Care Physician, Dr. Penni Homans. LCSW collaboration with Primary Care Physician, Dr. Penni Homans to request order for counseling services be faxed to [place], providing fax number.   Continue to incorporate into daily practice - relaxation techniques, deep breathing exercises and mindfulness meditation strategies, referring back to [facility] Educational Material, if necessary. ~ Relaxation Techniques for Depression. ~ Stress Management - Breathing Exercises for Relaxation. ~ 12 Common Symptoms of Depression That Should Not Be Ignored Consider self-enrollment in a Dementia Support Group, both on-line and in-person, from the list provided.  ~ [place] in [place]. ~ [place]. ~ Alzheimer's Association Support Groups in [facility]. ~ [place]. Contact LCSW directly (#  [phone]) if you have questions, need assistance, or if additional social work needs are identified between now and our next scheduled telephone outreach call.  Follow-Up:  03/02/2021 at 10:30 am  Please call the care guide team at [phone] if you need to cancel or reschedule your appointment.   If you are experiencing a Mental Health or [place] or need someone to talk to, please call the Suicide and Crisis Lifeline: 988 call the [place] National Suicide Prevention Lifeline: 727-775-5025 or TTY: (240) 567-2236 TTY 318-510-2644) to talk to a trained counselor call 1-800-273-TALK (toll free, 24 hour hotline) go to [facility] [address] [place] [phone]) call the [place]: [phone] call 911   Patient verbalizes understanding of instructions and care plan provided today and agrees to view in [place]. Active MyChart status confirmed with patient.    [place] Licensed Clinical Social Worker [facility] [employer] [facility] [phone]

## 2021-02-24 NOTE — Chronic Care Management (AMB) (Signed)
Chronic Care Management    Clinical Social Work Note  02/24/2021 Name: Shannon [name] MRN: [id number] DOB: 04-25-1945  Shannon [name] is a 76 y.o. year old female who is a primary care patient of Shannon Lukes, MD. The CCM team was consulted to assist the patient with chronic disease management and/or care coordination needs related to: Shannon [name] and Shannon [name].   Engaged with patient by telephone for follow up visit in response to provider referral for social work chronic care management and care coordination services.   Consent to Services:  The patient was given information about Chronic Care Management services, agreed to services, and gave verbal consent prior to initiation of services.  Please see initial visit note for detailed documentation.   Patient agreed to services and consent obtained.   Assessment: Review of patient past medical history, allergies, medications, and health status, including review of relevant consultants reports was performed today as part of a comprehensive evaluation and provision of chronic care management and care coordination services.     SDOH (Social Determinants of Health) assessments and interventions performed:    Advanced Directives Status: Not addressed in this encounter.  CCM Care Plan  Allergies  Allergen Reactions   Dilaudid [Hydromorphone Hcl] Itching    Outpatient Encounter Medications as of 02/24/2021  Medication Sig   acetaminophen (TYLENOL) 500 MG tablet Take 1-2 tablets (500-1,000 mg total) by mouth every 8 (eight) hours as needed for moderate pain.   albuterol (VENTOLIN HFA) 108 (90 Base) MCG/ACT inhaler Inhale 1-2 puffs into the lungs every 6 (six) hours as needed for wheezing or shortness of breath.   Calcium Carbonate (CALCIUM 600 PO) Take 1 tablet by mouth in the morning and at bedtime.   carvedilol (COREG) 25 MG tablet TAKE 1 TABLET TWICE DAILY WITH A MEAL   cetirizine (ZYRTEC) 10 MG tablet  Take 10 mg by mouth daily.   diltiazem (CARDIZEM CD) 120 MG 24 hr capsule Take 1 capsule (120 mg total) by mouth daily.   docusate sodium (COLACE) 250 MG capsule Take 250 mg by mouth daily as needed for constipation.   donepezil (ARICEPT) 10 MG tablet TAKE 1 TABLET AT BEDTIME   ergocalciferol (VITAMIN D2) 1.25 MG (50000 UT) capsule Take 2,000 Units by mouth daily.   famotidine (PEPCID) 20 MG tablet TAKE 1 TABLET BY MOUTH TWICE A DAY   ferrous sulfate 325 (65 FE) MG tablet Take 325 mg by mouth daily with breakfast.   folic acid (FOLVITE) 1 MG tablet Take 1 tablet (1 mg total) by mouth daily.   gabapentin (NEURONTIN) 300 MG capsule Take 1 capsule (300 mg total) by mouth 2 (two) times daily.   hydrOXYzine (ATARAX/VISTARIL) 25 MG tablet Take 0.5-1 tablets (12.5-25 mg total) by mouth 2 (two) times daily as needed.   Melatonin 10 MG CAPS Take 1 capsule by mouth at bedtime.   mirabegron ER (MYRBETRIQ) 25 MG TB24 tablet Take 1 tablet by mouth daily.   Multiple Vitamin (MULTIVITAMIN WITH MINERALS) TABS tablet Take 1 tablet by mouth daily.   omeprazole (PRILOSEC) 20 MG capsule Take 1 capsule (20 mg total) by mouth daily.   pentosan polysulfate (ELMIRON) 100 MG capsule Take 100 mg by mouth as directed. two at morning and two at night   rosuvastatin (CRESTOR) 20 MG tablet TAKE 1 TABLET AT BEDTIME   telmisartan (MICARDIS) 40 MG tablet Take 1 tablet (40 mg total) by mouth daily.   venlafaxine XR (EFFEXOR-XR) 75 MG  24 hr capsule TAKE 2 CAPSULES EVERY DAY WITH BREAKFAST   No facility-administered encounter medications on file as of 02/24/2021.    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   Right knee pain 07/10/2020   Urinary frequency 02/11/2020   Debility  12/23/2017   Left knee pain 12/23/2017   Vitamin D deficiency 06/14/2017   Vertebral fracture, osteoporotic 04/05/2016   Left-sided back pain 03/06/2016   Chest pain 02/27/2016   Syncope 02/27/2016   Cataracts, bilateral 12/28/2015   Recurrent falls 12/28/2015   Family history- stomach cancer 09/08/2015   Family history of cancer 09/08/2015   Shannon [name] lesion 06/21/2015   Obesity 09/28/2014   Interstitial cystitis 09/28/2014   Pain in joint, ankle and foot 09/28/2014   Abnormal nuclear stress test 12/16/2013   Sternal pain 11/06/2013   Postoperative anemia due to acute blood loss 02/13/2013   Congestive dilated cardiomyopathy (Shannon [name]) 12/30/2012   Breast cancer of lower-inner quadrant of right female breast 10/21/2012   Constipation 03/03/2012   OA (osteoarthritis) 09/07/2011   Dermatitis    Hyponatremia 08/28/2011   Osteoporosis 03/07/2011   Prediabetes 06/15/2009   Lipoma 02/10/2009   Dyspnea on exertion 02/10/2009   Hyperlipidemia, mixed 08/11/2008   Major depressive disorder with anxious distress 08/11/2008   Essential hypertension 08/11/2008   GERD (gastroesophageal reflux disease) 08/11/2008   Craniopharyngioma 2000    Conditions to be addressed/monitored: Depression and Dementia.  Mental Health Concerns, Cognitive Deficits, Memory Deficits, and Lacks Knowledge of Shannon [name].  Care Plan : LCSW Plan of Care  Updates made by Shannon Gaines, LCSW since 02/24/2021 12:00 AM     Problem: Reduce and Manage My Symptoms of Anxiety and Depression.   Priority: High     Goal: Reduce and Manage My Symptoms of Anxiety and Depression.   Start Date: 12/30/2020  Expected End Date: 03/30/2021  This Visit's Progress: On track  Recent Progress: On track  Priority: High  Note:   Current Barriers:   Acute Mental Health needs related to Debility, Obesity, Major Depressive Disorder with Anxious Distress, and Mild Neurocognitive Disorder due to Alzheimer's Disease,  requires Support, Education, [name], Referrals, Advocacy, and Care Coordination, in order to meet unmet mental health needs. Clinical Goal(s):  Patient will work with LCSW to reduce and manage symptoms of Anxiety and Depression, until re-established with a community mental health provider.     Patient will increase knowledge and/or ability of:        Coping Skills, Healthy Habits, Self-Management Skills, Stress Reduction, Home Safety and Utilizing [employer] and [name].   Clinical Interventions:  Solution-Focused Therapy performed, Verbalization of Feelings encouraged, Emotional Support provided, Problem Solving Solutions developed, Brief Cognitive Behavioral Therapy initiated, Quality of Sleep assessed and Sleep Hygiene Techniques promoted, Psychotropic Medication Regimen reviewed and Compliance indicated, Support Group Participation encouraged and Increase Level of Activity/Exercise emphasized.    Collaboration with Primary Care Physician, Dr. Penni Homans regarding development and update of comprehensive plan of care as evidenced by provider attestation and co-signature. Collaboration with Primary Care Physician, Dr. Penni Homans to request order for counseling services be faxed to [place], providing fax number.   List of Dementia Support Groups provided, both on-line and in-person, via e-mail ([email or web address]) and mail. Inter-disciplinary  care team collaboration (see longitudinal plan of care). Patient Goals/Self-Care Activities: Continue to receive personal counseling with LCSW, on a bi-weekly basis, to reduce and manage symptoms of Anxiety and Depression, until re-established with Clint Bolder, LCSW with [place] [phone]).   LCSW collaboration with Clint Bolder, LCSW with [place] [phone]), to schedule your follow-up appointment, in an effort to re-establish services.  ~ HIPAA compliant messages left  with receptionist for Clint Bolder, LCSW with [place], as LCSW continues to await a return call. ~ To re-establish services with Clint Bolder, LCSW, a new order for counseling services needs to be faxed to [place] (fax # [phone]) by Primary Care Physician, Dr. Penni Homans. LCSW collaboration with Primary Care Physician, Dr. Penni Homans to request order for counseling services be faxed to [name], providing fax number.   Continue to incorporate into daily practice - relaxation techniques, deep breathing exercises and mindfulness meditation strategies, referring back to [facility] Educational Material, if necessary. ~ Relaxation Techniques for Depression. ~ Stress Management - Breathing Exercises for Relaxation. ~ 12 Common Symptoms of Depression That Should Not Be Ignored Consider self-enrollment in a Dementia Support Group, both on-line and in-person, from the list provided.  ~ [place] in [place]. ~ [place]. ~ Alzheimer's Association Support Groups in [facility]. ~ [place]. Contact LCSW directly (# [phone]) if you have questions, need assistance, or if additional social work needs are identified between now and our next scheduled telephone outreach call.  Follow-Up:  03/02/2021 at 10:30 am      [place] Social Worker [place] [phone]

## 2021-03-01 ENCOUNTER — Encounter: Payer: Self-pay | Admitting: Family Medicine

## 2021-03-01 ENCOUNTER — Ambulatory Visit (INDEPENDENT_AMBULATORY_CARE_PROVIDER_SITE_OTHER): Payer: Medicare Other | Admitting: Family Medicine

## 2021-03-01 DIAGNOSIS — R350 Frequency of micturition: Secondary | ICD-10-CM

## 2021-03-01 DIAGNOSIS — F32A Depression, unspecified: Secondary | ICD-10-CM | POA: Diagnosis not present

## 2021-03-01 DIAGNOSIS — E559 Vitamin D deficiency, unspecified: Secondary | ICD-10-CM

## 2021-03-01 DIAGNOSIS — F0390 Unspecified dementia without behavioral disturbance: Secondary | ICD-10-CM

## 2021-03-01 DIAGNOSIS — R000 Tachycardia, unspecified: Secondary | ICD-10-CM

## 2021-03-01 DIAGNOSIS — E782 Mixed hyperlipidemia: Secondary | ICD-10-CM

## 2021-03-01 DIAGNOSIS — F418 Other specified anxiety disorders: Secondary | ICD-10-CM | POA: Diagnosis not present

## 2021-03-01 DIAGNOSIS — I10 Essential (primary) hypertension: Secondary | ICD-10-CM

## 2021-03-01 DIAGNOSIS — E785 Hyperlipidemia, unspecified: Secondary | ICD-10-CM

## 2021-03-01 MED ORDER — VENLAFAXINE HCL ER 75 MG PO CP24
225.0000 mg | ORAL_CAPSULE | Freq: Every day | ORAL | 1 refills | Status: DC
Start: 2021-03-01 — End: 2021-08-31

## 2021-03-01 NOTE — Patient Instructions (Addendum)
Shingrix is the new shingles shot, 2 shots over 2-6 months, confirm coverage with insurance and document, then can return here for shots with nurse appt or at pharmacy  [facility] to see if you [facility] had [facility]. Increase fluids to 60-80 ounces daily and try plain Mucinex at bedtime Dementia Dementia is a condition that affects the way the brain functions. It often affects memory and thinking. Usually, dementia gets worse with time and cannot be reversed (progressive dementia). There are many types of dementia, including: Alzheimer's disease. This type is the most common. Vascular dementia. This type may happen as the result of a stroke. Lewy body dementia. This type may happen to people who have Parkinson's disease. Frontotemporal dementia. This type is caused by damage to nerve cells (neurons) in certain parts of the brain. Some people may be affected by more than one type of dementia. This is called mixed dementia. What are the causes? Dementia is caused by damage to cells in the brain. The area of the brain and the types of cells damaged determine the type of dementia. Usually, this damage is irreversible or cannot be undone. Some examples of irreversible causes include: Conditions that affect the blood vessels of the brain, such as diabetes, heart disease, or blood vessel disease. Genetic mutations. In some cases, changes in the brain may be caused by another condition and can be reversed or slowed. Some examples of reversible causes include: Injury to the brain. Certain medicines. Infection, such as meningitis. Metabolic problems, such as vitamin B12 deficiency or thyroid disease. Pressure on the brain, such as from a tumor, blood clot, or too much fluid in the brain (hydrocephalus). Autoimmune diseases that affect the brain or arteries, such as limbic encephalitis or vasculitis. What are the signs or symptoms? Symptoms of dementia depend on the type of dementia. Common signs of dementia  include problems with remembering, thinking, problem solving, decision making, and communicating. These signs develop slowly or get worse with time. This may include: Problems remembering events or people. Having trouble taking a bath or putting clothes on. Forgetting appointments or forgetting to pay bills. Difficulty planning and preparing meals. Having trouble speaking. Getting lost easily. Changes in behavior or mood. How is this diagnosed? This condition is diagnosed by a specialist (neurologist). It is diagnosed based on the history of your symptoms, your medical history, a physical exam, and tests. Tests may include: Tests to evaluate brain function, such as memory tests, cognitive tests, and other tests. Lab tests, such as blood or urine tests. Imaging tests, such as a CT scan, a PET scan, or an MRI. Genetic testing. This may be done if other family members have a diagnosis of certain types of dementia. Your health care provider will talk with you and your family, friends, or caregivers about your history and symptoms. How is this treated? Treatment for this condition depends on the cause of the dementia. Progressive dementias, such as Alzheimer's disease, cannot be cured, but there may be treatments that help to manage symptoms. Treatment might involve taking medicines that may help to: Control the dementia. Slow down the progression of the dementia. Manage symptoms. In some cases, treating the cause of your dementia can improve symptoms, reverse symptoms, or slow down how quickly your dementia becomes worse. Your health care provider can direct you to support groups, organizations, and other health care providers who can help with decisions about your care. Follow these instructions at home: Medicines Take over-the-counter and prescription medicines only as  told by your health care provider. Use a pill organizer or pill reminder to help you manage your medicines. Avoid taking  medicines that can affect thinking, such as pain medicines or sleeping medicines. Lifestyle Make healthy lifestyle choices. Be physically active as told by your health care provider. Do not use any products that contain nicotine or tobacco, such as cigarettes, e-cigarettes, and chewing tobacco. If you need help quitting, ask your health care provider. Do not drink alcohol. Practice stress-management techniques when you get stressed. Spend time with other people. Make sure to get quality sleep. These tips can help you get a good night's rest: Avoid napping during the day. Keep your sleeping area dark and cool. Avoid exercising during the few hours before you go to bed. Avoid caffeine products in the evening. Eating and drinking Drink enough fluid to keep your urine pale yellow. Eat a healthy diet. General instructions  Work with your health care provider to determine what you need help with and what your safety needs are. Talk with your health care provider about whether it is safe for you to drive. If you were given a bracelet that identifies you as a person with memory loss or tracks your location, make sure to wear it at all times. Work with your family to make important decisions, such as advance directives, medical power of attorney, or a living will. Keep all follow-up visits. This is important. Where to find more information Alzheimer's Association: [employer].co.nz National Institute on Aging: DVDEnthusiasts.nl World Health Organization: RoleLink.com.br Contact a health care provider if: You have any new or worsening symptoms. You have problems with choking or swallowing. Get help right away if: You feel depressed or sad, or feel that you want to harm yourself. Your family members become concerned for your safety. If you ever feel like you may hurt yourself or others, or have thoughts about taking your own life, get help right away. Go to your nearest emergency department  or: Call your local emergency services (911 in the [place] Call a suicide crisis helpline, such as the [facility] at 805-064-9249 or 988 in the [place]. This is open 24 hours a day in the [place] Text the Crisis Text Line at 725-482-3563 (in the [place].). Summary Dementia is a condition that affects the way the brain functions. Dementia often affects memory and thinking. Usually, dementia gets worse with time and cannot be reversed (progressive dementia). Treatment for this condition depends on the cause of the dementia. Work with your health care provider to determine what you need help with and what your safety needs are. Your health care provider can direct you to support groups, organizations, and other health care providers who can help with decisions about your care. This information is not intended to replace advice given to you by your health care provider. Make sure you discuss any questions you have with your health care provider. Document Revised: 08/11/2020 Document Reviewed: 06/02/2019 Elsevier Patient Education  [place].   ' '   '

## 2021-03-02 ENCOUNTER — Telehealth: Payer: Self-pay | Admitting: Family Medicine

## 2021-03-02 ENCOUNTER — Ambulatory Visit (INDEPENDENT_AMBULATORY_CARE_PROVIDER_SITE_OTHER): Payer: Medicare Other | Admitting: *Deleted

## 2021-03-02 DIAGNOSIS — G309 Alzheimer's disease, unspecified: Secondary | ICD-10-CM

## 2021-03-02 DIAGNOSIS — I10 Essential (primary) hypertension: Secondary | ICD-10-CM

## 2021-03-02 DIAGNOSIS — F0670 Mild neurocognitive disorder due to known physiological condition without behavioral disturbance: Secondary | ICD-10-CM

## 2021-03-02 DIAGNOSIS — E782 Mixed hyperlipidemia: Secondary | ICD-10-CM

## 2021-03-02 DIAGNOSIS — M810 Age-related osteoporosis without current pathological fracture: Secondary | ICD-10-CM

## 2021-03-02 DIAGNOSIS — F0390 Unspecified dementia without behavioral disturbance: Secondary | ICD-10-CM | POA: Insufficient documentation

## 2021-03-02 DIAGNOSIS — N3010 Interstitial cystitis (chronic) without hematuria: Secondary | ICD-10-CM

## 2021-03-02 DIAGNOSIS — F339 Major depressive disorder, recurrent, unspecified: Secondary | ICD-10-CM

## 2021-03-02 DIAGNOSIS — R0609 Other forms of dyspnea: Secondary | ICD-10-CM

## 2021-03-02 DIAGNOSIS — R296 Repeated falls: Secondary | ICD-10-CM

## 2021-03-02 DIAGNOSIS — F0280 Dementia in other diseases classified elsewhere without behavioral disturbance: Secondary | ICD-10-CM | POA: Insufficient documentation

## 2021-03-02 DIAGNOSIS — R202 Paresthesia of skin: Secondary | ICD-10-CM

## 2021-03-02 DIAGNOSIS — D444 Neoplasm of uncertain behavior of craniopharyngeal duct: Secondary | ICD-10-CM

## 2021-03-02 DIAGNOSIS — I420 Dilated cardiomyopathy: Secondary | ICD-10-CM

## 2021-03-02 DIAGNOSIS — F418 Other specified anxiety disorders: Secondary | ICD-10-CM

## 2021-03-02 DIAGNOSIS — E669 Obesity, unspecified: Secondary | ICD-10-CM

## 2021-03-02 NOTE — Assessment & Plan Note (Addendum)
Encourage heart healthy diet such as MIND or DASH diet, increase exercise, avoid trans fats, simple carbohydrates and processed foods, consider a krill or fish or flaxseed oil cap daily.  Tolerating Rosuvastatin 

## 2021-03-02 NOTE — Telephone Encounter (Signed)
Social worker called for pt as she is trying to reestablish care with Clint Bolder. She stated request can be made via fax # [phone]. Please advise.

## 2021-03-02 NOTE — Assessment & Plan Note (Signed)
Is set up with urogyn and she is on Myrebetriq

## 2021-03-02 NOTE — Assessment & Plan Note (Signed)
Supplement and monitor 

## 2021-03-02 NOTE — Patient Instructions (Signed)
Visit Information  Thank you for taking time to visit with me today. Please don't hesitate to contact me if I can be of assistance to you before our next scheduled telephone appointment.  Following are the goals we discussed today:  Patient Goals/Self-Care Activities: Continue to receive personal counseling with LCSW, on a bi-weekly basis, to reduce and manage symptoms of Anxiety and Depression, until re-established with Clint Bolder, LCSW with [place].   LCSW collaboration with Clint Bolder, LCSW with [facility] [phone]), to schedule your follow-up appointment, in an effort to re-establish services. ~ Continued HIPAA compliant messages left with receptionist for Clint Bolder, LCSW with [place], as all clinicians are currently working remotely from home.  LCSW continues to await a return call. ~ To re-establish services with Clint Bolder, LCSW, a new order for counseling services needs to be faxed to [facility] (fax # [phone]) by Primary Care Physician, Dr. Penni Homans. LCSW collaboration with Primary Care Physician, Dr. Penni Homans to request order for counseling services be faxed to [facility], providing fax number.   Continue to incorporate into daily practice - relaxation techniques, deep breathing exercises and mindfulness meditation strategies, referring back to [facility] Educational Material provided, if necessary. ~ Relaxation Techniques for Depression. ~ Stress Management - Breathing Exercises for Relaxation. ~ 12 Common Symptoms of Depression That Should Not Be Ignored. Continue to consider self-enrollment in a Dementia Support Group, both on-line and in-person, from the list provided.  ~ [place] in [place]. ~ [place]. ~ Alzheimer's Association Support Groups in [facility]. ~ [place]. Continue to review Techniques for Coping with a New Dementia Diagnosis: ~ Be Kind to Yourself -  Dementia is not your fault and there is no reason to blame yourself.  With time, you can work through the shock of diagnosis and prepare yourself for the challenges that lie ahead. ~ Reaffirm Your Identity - Dementia does not have to define who you are as a person.  Pursue roles that define your sense of self: spouse, parent, grandparent, Therapist, sports, gardener, cyclist, musician, Psychologist, occupational, friend, etc. ~ Allow Yourself to Feel - Unpleasant emotions exist whether you choose to acknowledge them or not. Trying to ignore your feelings will only fuel your stress and delay acceptance of your new situation. By allowing yourself to feel your emotions, you will find that even the most intense, upsetting feelings will pass, the shock and distress will start to fade, and you will be able to see a path forward. ~ Learn All You Can About the Type of Dementia You Have Been Diagnosed With - By learning all you can, you can better cope with symptoms and even help slow the progression of the disease. ~ Seek Early Intervention - Since Dementia symptoms can be caused by any number of conditions, obtaining an early, accurate diagnosis is critical, especially if symptoms appear suddenly.  Timely intervention may also control or eliminate symptoms from other physical and psychological factors. ~ [place] can help preserve your health and autonomy for longer by taking simple precautions, such as removing tripping hazards, increasing lighting, developing daily routines, and leaving reminder notes where you need them most.  Keep items you use daily in easy to remember places, such keys on a hook by the door.  Making use of phone apps, calendars, and alarms can also help you stay  organized.  As your needs change over time, planning and flexibility can help keep you one  step ahead. Contact LCSW directly (# [phone]) if you have questions, need assistance, or if additional social work needs are identified between now and our next scheduled telephone outreach call.  Follow-Up:  03/16/2021 at 1:00 pm  Please call the care guide team at [phone] if you need to cancel or reschedule your appointment.   If you are experiencing a Mental Health or [place] or need someone to talk to, please call the Suicide and Crisis Lifeline: 988 call the [place] National Suicide Prevention Lifeline: (208) 569-2063 or TTY: 618-419-5850 TTY (650)280-7547) to talk to a trained counselor call 1-800-273-TALK (toll free, 24 hour hotline) go to [facility] [address] [place] [phone]) call the [place]: [phone] call 911   Patient verbalizes understanding of instructions and care plan provided today and agrees to view in [place]. Active MyChart status confirmed with patient.    [place] Licensed Clinical Social Worker [facility] [phone]

## 2021-03-02 NOTE — Assessment & Plan Note (Signed)
She is following with neurology now and is on Aricept. She is not experiencing any side effects but is still anxious and upset about the diagnosis. Will continue to work with her to help her

## 2021-03-02 NOTE — Chronic Care Management (AMB) (Signed)
Chronic Care Management    Clinical Social Work Note  03/02/2021 Name: Shannon [name] MRN: [id number] DOB: 11-23-1945  Shannon [name] is a 76 y.o. year old female who is a primary care patient of Mosie Lukes, MD. The CCM team was consulted to assist the patient with chronic disease management and/or care coordination needs related to: [employer], Mental Health Counseling and Resources, and Grief Counseling.   Engaged with patient by telephone for follow up visit in response to provider referral for social work chronic care management and care coordination services.   Consent to Services:  The patient was given information about Chronic Care Management services, agreed to services, and gave verbal consent prior to initiation of services.  Please see initial visit note for detailed documentation.   Patient agreed to services and consent obtained.   Assessment: Review of patient past medical history, allergies, medications, and health status, including review of relevant consultants reports was performed today as part of a comprehensive evaluation and provision of chronic care management and care coordination services.     SDOH (Social Determinants of Health) assessments and interventions performed:    Advanced Directives Status: Not addressed in this encounter.  CCM Care Plan  Allergies  Allergen Reactions   Dilaudid [Hydromorphone Hcl] Itching    Outpatient Encounter Medications as of 03/02/2021  Medication Sig   acetaminophen (TYLENOL) 500 MG tablet Take 1-2 tablets (500-1,000 mg total) by mouth every 8 (eight) hours as needed for moderate pain.   albuterol (VENTOLIN HFA) 108 (90 Base) MCG/ACT inhaler INHALE 1-2 PUFFS INTO THE LUNGS EVERY 6 (SIX) HOURS AS NEEDED FOR WHEEZING OR SHORTNESS OF BREATH.   Calcium Carbonate (CALCIUM 600 PO) Take 1 tablet by mouth in the morning and at bedtime.   carvedilol (COREG) 25 MG tablet TAKE 1 TABLET TWICE DAILY WITH A MEAL   cetirizine  (ZYRTEC) 10 MG tablet Take 10 mg by mouth daily.   diltiazem (CARDIZEM CD) 120 MG 24 hr capsule Take 1 capsule (120 mg total) by mouth daily.   docusate sodium (COLACE) 250 MG capsule Take 250 mg by mouth daily as needed for constipation.   donepezil (ARICEPT) 10 MG tablet TAKE 1 TABLET AT BEDTIME   ergocalciferol (VITAMIN D2) 1.25 MG (50000 UT) capsule Take 2,000 Units by mouth daily.   famotidine (PEPCID) 20 MG tablet TAKE 1 TABLET BY MOUTH TWICE A DAY   ferrous sulfate 325 (65 FE) MG tablet Take 325 mg by mouth daily with breakfast.   folic acid (FOLVITE) 1 MG tablet Take 1 tablet (1 mg total) by mouth daily.   gabapentin (NEURONTIN) 300 MG capsule Take 1 capsule (300 mg total) by mouth 2 (two) times daily.   hydrOXYzine (ATARAX/VISTARIL) 25 MG tablet Take 0.5-1 tablets (12.5-25 mg total) by mouth 2 (two) times daily as needed.   Melatonin 10 MG CAPS Take 1 capsule by mouth at bedtime.   mirabegron ER (MYRBETRIQ) 25 MG TB24 tablet Take 1 tablet by mouth daily.   Multiple Vitamin (MULTIVITAMIN WITH MINERALS) TABS tablet Take 1 tablet by mouth daily.   omeprazole (PRILOSEC) 20 MG capsule Take 1 capsule (20 mg total) by mouth daily.   pentosan polysulfate (ELMIRON) 100 MG capsule Take 100 mg by mouth as directed. two at morning and two at night   rosuvastatin (CRESTOR) 20 MG tablet TAKE 1 TABLET AT BEDTIME   telmisartan (MICARDIS) 40 MG tablet Take 1 tablet (40 mg total) by mouth daily.   venlafaxine XR (EFFEXOR-XR)  75 MG 24 hr capsule Take 3 capsules (225 mg total) by mouth daily with breakfast.   No facility-administered encounter medications on file as of 03/02/2021.    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   Right knee pain 07/10/2020    Urinary frequency 02/11/2020   Debility 12/23/2017   Left knee pain 12/23/2017   Vitamin D deficiency 06/14/2017   Vertebral fracture, osteoporotic 04/05/2016   Left-sided back pain 03/06/2016   Chest pain 02/27/2016   Syncope 02/27/2016   Cataracts, bilateral 12/28/2015   Recurrent falls 12/28/2015   Family history- stomach cancer 09/08/2015   Family history of cancer 09/08/2015   [name] lesion 06/21/2015   Obesity 09/28/2014   Interstitial cystitis 09/28/2014   Pain in joint, ankle and foot 09/28/2014   Abnormal nuclear stress test 12/16/2013   Sternal pain 11/06/2013   Postoperative anemia due to acute blood loss 02/13/2013   Congestive dilated cardiomyopathy ([place]) 12/30/2012   Breast cancer of lower-inner quadrant of right female breast 10/21/2012   Constipation 03/03/2012   OA (osteoarthritis) 09/07/2011   Dermatitis    Hyponatremia 08/28/2011   Osteoporosis 03/07/2011   Prediabetes 06/15/2009   Lipoma 02/10/2009   Dyspnea on exertion 02/10/2009   Hyperlipidemia, mixed 08/11/2008   Major depressive disorder with anxious distress 08/11/2008   Essential hypertension 08/11/2008   GERD (gastroesophageal reflux disease) 08/11/2008   Craniopharyngioma 2000    Conditions to be addressed/monitored: Anxiety and Depression.  Mental Health Concerns, Social Isolation, Limited Access to Caregiver, Cognitive Deficits, Memory Deficits, and Lacks Knowledge of [employer].  Care Plan : LCSW Plan of Care  Updates made by Francis Gaines, LCSW since 03/02/2021 12:00 AM     Problem: Reduce and Manage My Symptoms of Anxiety and Depression.   Priority: High     Goal: Reduce and Manage My Symptoms of Anxiety and Depression.   Start Date: 12/30/2020  Expected End Date: 03/30/2021  This Visit's Progress: On track  Recent Progress: On track  Priority: High  Note:   Current Barriers:   Acute Mental Health needs related to Debility, Obesity, Major Depressive Disorder with  Anxious Distress, and Mild Neurocognitive Disorder due to Alzheimer's Disease, requires Support, Education, Resources, Referrals, Advocacy, and Care Coordination, in order to meet unmet mental health needs. Clinical Goal(s):  Patient will work with LCSW to reduce and manage symptoms of Anxiety and Depression, until re-established with a community mental health provider.     Patient will increase knowledge and/or ability of:        Coping Skills, Healthy Habits, Self-Management Skills, Stress Reduction, Home Safety and Utilizing [employer] and Resources.   Clinical Interventions:  Solution-Focused Therapy performed, Verbalization of Feelings encouraged, Emotional Support provided, Problem Solving Solutions developed, Brief Cognitive Behavioral Therapy initiated, Quality of Sleep assessed and Sleep Hygiene Techniques promoted, Psychotropic Medication Regimen reviewed and Compliance indicated, Support Group: Participation encouraged and Increase Level of Activity/Exercise emphasized.    Collaboration with Primary Care Physician, Dr. Penni Homans regarding development and update of comprehensive plan of care as evidenced by provider attestation and co-signature. Collaboration with Primary Care Physician, Dr. Penni Homans to request order for counseling services be faxed to [facility], providing fax number.   List of Dementia Support  Groups provided, both on-line and in-person, via e-mail ([email or web address]) and mail. Inter-disciplinary care team collaboration (see longitudinal plan of care). Patient Goals/Self-Care Activities: Continue to receive personal counseling with LCSW, on a bi-weekly basis, to reduce and manage symptoms of Anxiety and Depression, until re-established with Clint Bolder, LCSW with [facility].   LCSW collaboration with Clint Bolder, LCSW with [place] [facility] [phone]), to schedule your follow-up appointment, in an effort  to re-establish services. ~ Continued HIPAA compliant messages left with receptionist for Clint Bolder, LCSW with [place], as all clinicians are currently working remotely from home.  LCSW continues to await a return call. ~ To re-establish services with Clint Bolder, LCSW, a new order for counseling services needs to be faxed to [facility] (fax # [phone]) by Primary Care Physician, Dr. Penni Homans. LCSW collaboration with Primary Care Physician, Dr. Penni Homans to request order for counseling services be faxed to [facility], providing fax number.   Continue to incorporate into daily practice - relaxation techniques, deep breathing exercises and mindfulness meditation strategies, referring back to [facility] Educational Material provided, if necessary. ~ Relaxation Techniques for Depression. ~ Stress Management - Breathing Exercises for Relaxation. ~ 12 Common Symptoms of Depression That Should Not Be Ignored. Continue to consider self-enrollment in a Dementia Support Group, both on-line and in-person, from the list provided.  ~ [place] in [place]. ~ [place]. ~ Alzheimer's Association Support Groups in [facility] ~ [place]. Continue to review Techniques for Coping with a New Dementia Diagnosis: ~ Be Kind to Yourself -  Dementia is not your fault and there is no reason to blame yourself.  With time, you can work through the shock of diagnosis and prepare yourself for the challenges that lie ahead. ~ Reaffirm Your Identity - Dementia does not have to define who you are as a person.  Pursue roles that define your sense of self: spouse, parent, grandparent, Therapist, sports, gardener, cyclist, musician, Psychologist, occupational, friend, etc. ~ Allow Yourself to Feel - Unpleasant emotions exist whether you choose to  acknowledge them or not. Trying to ignore your feelings will only fuel your stress and delay acceptance of your new situation. By allowing yourself to feel your emotions, you will find that even the most intense, upsetting feelings will pass, the shock and distress will start to fade, and you will be able to see a path forward. ~ Learn All You Can About the Type of Dementia You Have Been Diagnosed With - By learning all you can, you can better cope with symptoms and even help slow the progression of the disease. ~ Seek Early Intervention - Since Dementia symptoms can be caused by any number of conditions, obtaining an early, accurate diagnosis is critical, especially if symptoms appear suddenly.  Timely intervention may also control or eliminate symptoms from other physical and psychological factors. ~ [place] can help preserve your health and autonomy for longer by taking simple precautions, such as removing tripping hazards, increasing lighting, developing daily routines, and leaving reminder notes where you need them most.  Keep items you use daily in easy to remember places, such keys on a hook by the door.  Making use of phone apps, calendars, and alarms can also help you stay organized.  As your needs change over time, planning and flexibility can help keep you one step ahead. Contact LCSW directly (#  [phone]) if you have questions, need assistance, or if additional social work needs are identified between now and our next scheduled telephone outreach call.  Follow-Up:  03/16/2021 at 1:00 pm      Nat Christen [place] Clinical Social Worker [place] [phone]

## 2021-03-02 NOTE — Assessment & Plan Note (Signed)
Anxiety and anhedonia are still present after several months on Venlafaxine xr 150 mg daily will increase to 225 mg daily and reevaluate she will let us know if any concerns

## 2021-03-02 NOTE — Progress Notes (Signed)
Subjective:    Patient ID: Shannon [name], female    DOB: 12/27/1945, 76 y.o.   MRN: [id number]  Chief Complaint  Patient presents with   4 months follow up    HPI Patient is in today for follow up on chronic medical concerns. No recent febrile illness or hospitalizations. She is accompanied by her husband. she is still frustrated with her dementia diagnosis her is tolerating her Aricept.  She denies any obvious side effects.  She does still endorse anhedonia and increased anxiety despite the increase in venlafaxine.  No other acute concerns. Denies CP/palp/SOB/HA/congestion/fevers/GI or GU c/o. 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 Echo 2012: EF 45-50%;  b. Lex MV 2/12:  low risk, apical defect (small area of ischemia vs shifting breast atten);  c.  Echo 7/12: Normal wall thickness, EF 60-65%, normal wall motion, grade 1 diastolic dysfunction, mild LAE, PASP 32;   d. Lex MV 11/13:  EF 76%, no ischemia   OA (osteoarthritis) 09/07/2011   Obesity 09/28/2014   Osteoporosis 03/07/2011   DEXA T score -2.6 AP spine 03/07/11    Formatting of this note might be different from the original. Formatting of this note might be different from the original. DEXA T score -2.6 AP spine 03/07/11   Last Assessment & Plan:  Formatting of this note might be different from the original. Encouraged to get adequate exercise, calcium and vitamin d intake   Pain in joint, ankle and foot 09/28/2014   Palpitation 10/28/2020   Personal history of chemotherapy 2012   Personal history of radiation therapy 2012   Prediabetes 06/15/2009   Recurrent falls 12/28/2015   Right knee pain 07/10/2020   Shortness of breath    UTI (urinary tract infection) 03/03/2012   Vertebral fracture, osteoporotic, sequela 04/05/2016   Vitamin D deficiency 06/14/2017   Supplement and monitor    Past Surgical History:  Procedure Laterality Date   BREAST LUMPECTOMY  04/2009   RIGHT FOR BREAST CANCER-CHEMO/RADIATION X 1 YEAR   BREAST  REDUCTION SURGERY Bilateral 05/04/2014   Procedure: MAMMARY REDUCTION  (BREAST);  Surgeon: Cristine Polio, MD;  Location: [facility];  Service: Plastics;  Laterality: Bilateral;   CARPAL TUNNEL RELEASE     L wrist, ulnar nerve moved   CHOLECYSTECTOMY     CRANIOTOMY FOR TUMOR  2000   ELBOW SURGERY     left   KNEE ARTHROSCOPY Left 10/12/2014   Procedure: LEFT KNEE ARTHROSCOPY ;  Surgeon: Gaynelle Arabian, MD;  Location: [facility];  Service: Orthopedics;  Laterality: Left;   KYPHOPLASTY N/A 03/30/2016   Procedure: THORACIC 12 KYPHOPLASTY;  Surgeon: Phylliss Bob, MD;  Location: [facility];  Service: Orthopedics;  Laterality: N/A;  THORACIC 12 KYPHOPLASTY   LEFT HEART CATHETERIZATION WITH CORONARY ANGIOGRAM N/A 12/16/2013   Procedure: LEFT HEART CATHETERIZATION WITH CORONARY ANGIOGRAM;  Surgeon: Peter M [name], MD;  Location: [facility];  Service: Cardiovascular;  Laterality: N/A;   LIPOMA EXCISION  03/28/2009   right leg   MELANOMA EXCISION     PORT-A-CATH REMOVAL  11/30/2010   St[facility]   porta cath     PORTACATH PLACEMENT  may 2011   REDUCTION MAMMAPLASTY Bilateral    SYNOVECTOMY Left 10/12/2014   Procedure: WITH SYNOVECTOMY;  Surgeon: Gaynelle Arabian, MD;  Location: [facility];  Service: Orthopedics;  Laterality: Left;   TONSILLECTOMY  1958   TOTAL KNEE ARTHROPLASTY  02/05/2012   Procedure: TOTAL KNEE ARTHROPLASTY;  Surgeon: Gearlean Alf, MD;  Location: [facility];  Service: Orthopedics;  Laterality: Right;   TOTAL KNEE ARTHROPLASTY Left 02/10/2013   Procedure: LEFT TOTAL KNEE ARTHROPLASTY;  Surgeon: Gearlean Alf, MD;  Location: [facility];  Service: Orthopedics;  Laterality: Left;   total knee raplacement  01-2012   Right Knee   TOTAL KNEE REVISION Left 07/14/2020   Procedure: TOTAL KNEE REVISION;  Surgeon: Gaynelle Arabian, MD;  Location: [facility];  Service: Orthopedics;  Laterality: Left;   TUBAL LIGATION  1997    Family History  Problem Relation Age of Onset   Breast cancer Mother        sarcoma   Lung cancer Mother    Hypertension Mother    Prostate cancer Father    Congestive Heart Failure Father    Heart attack Father    Prostate cancer Brother    Heart disease Maternal Grandfather        MI   Down syndrome Son    CVA Son    Stomach cancer Maternal Aunt    Dementia Maternal Aunt    Uterine cancer Maternal Aunt    Colon cancer Neg Hx     Social History   Socioeconomic History   Marital status: Married    Spouse name: Aliannah Holstrom   Number of children: 3   Years of education: 12   Highest education level: High school graduate   Occupational History   Occupation: boutique owner-retired    Employer: [employer]  Tobacco Use   Smoking status: Never    Passive exposure: Never   Smokeless tobacco: Never  Vaping Use   Vaping Use: Never used  Substance and Sexual Activity   Alcohol use: No   Drug use: No   Sexual activity: Not Currently  Other Topics Concern   Not on file  Social History Narrative   Patient is right-handed. She lives with her husband in a 4 story home. They take care of their grown son with down syndrome who has had a stroke. She drinks 1-2 glasses  of tea in the evenings. She does not formally exercise.      Social Determinants of Health   Financial Resource Strain: Low Risk    Difficulty of Paying Living Expenses: Not very hard  Food Insecurity: No Food Insecurity   Worried About Charity fundraiser in the Last Year: Never true   Ran Out of Food in the Last Year: Never true  Transportation Needs: No Transportation Needs   Lack of Transportation (Medical): No   Lack of Transportation (Non-Medical): No  Physical Activity: Inactive   Days of Exercise per Week: 0 days   Minutes of Exercise per Session: 0 min  Stress: Stress Concern Present   Feeling of Stress : Very much  Social Connections: Socially Integrated   Frequency of Communication with Friends and Family: More than three times a week   Frequency of Social Gatherings with Friends and Family: More than three times a week   Attends Religious Services: 1 to 4 times per year   Active Member of [employer] or Organizations: Yes   Attends Archivist Meetings: 1 to 4 times per year   Marital Status: Married  Human resources officer Violence: Not At Risk   Fear of Current or Ex-Partner: No   Emotionally Abused: No   Physically Abused: No   Sexually Abused: No    Outpatient Medications Prior to Visit  Medication Sig Dispense Refill   acetaminophen (TYLENOL) 500 MG tablet Take 1-2 tablets (500-1,000 mg total) by mouth every 8  (eight) hours as needed for moderate pain. 100 tablet 2   albuterol (VENTOLIN HFA) 108 (90 Base) MCG/ACT inhaler INHALE 1-2 PUFFS INTO THE LUNGS EVERY 6 (SIX) HOURS AS NEEDED FOR WHEEZING OR SHORTNESS OF BREATH. 18 each 3   Calcium Carbonate (CALCIUM 600 PO) Take 1 tablet by mouth in the morning and at bedtime.     carvedilol (COREG) 25 MG tablet TAKE 1 TABLET TWICE DAILY WITH A MEAL 180 tablet 3   ergocalciferol (VITAMIN D2) 1.25 MG (50000 UT) capsule Take 2,000 Units by mouth daily.     famotidine (PEPCID) 20 MG tablet TAKE 1 TABLET BY MOUTH TWICE A DAY 180 tablet 1   ferrous sulfate 325 (65 FE) MG tablet Take 325 mg by mouth daily with breakfast.     folic acid (FOLVITE) 1 MG tablet Take 1 tablet (1 mg total) by mouth daily. 90 tablet 0   gabapentin (NEURONTIN) 300 MG capsule Take 1 capsule (300 mg total) by mouth 2 (two) times daily. 60 capsule 5   hydrOXYzine (ATARAX/VISTARIL) 25 MG tablet Take 0.5-1 tablets (12.5-25 mg total) by mouth 2 (two) times daily as needed. 60 tablet 1   Melatonin 10 MG CAPS Take 1 capsule by mouth at bedtime.     mirabegron ER (MYRBETRIQ) 25 MG TB24 tablet Take 1 tablet by mouth daily.     Multiple Vitamin (MULTIVITAMIN WITH MINERALS) TABS tablet Take 1 tablet by mouth daily.     omeprazole (PRILOSEC) 20 MG capsule Take 1 capsule (20 mg total) by mouth daily. 30 capsule 3   pentosan polysulfate (ELMIRON) 100 MG capsule Take 100 mg by mouth as directed. two at morning and two at night     rosuvastatin (CRESTOR) 20 MG tablet TAKE 1 TABLET AT BEDTIME 90 tablet 3   telmisartan (MICARDIS) 40 MG tablet Take 1 tablet (40 mg total) by mouth daily.     venlafaxine XR (EFFEXOR-XR) 75 MG 24 hr capsule TAKE 2 CAPSULES EVERY DAY WITH  BREAKFAST 180 capsule 0   cetirizine (ZYRTEC) 10 MG tablet Take 10 mg by mouth daily.     diltiazem (CARDIZEM CD) 120 MG 24 hr capsule Take 1 capsule (120 mg total) by mouth daily. 30 capsule 3   docusate sodium (COLACE) 250 MG capsule Take 250 mg  by mouth daily as needed for constipation.     donepezil (ARICEPT) 10 MG tablet TAKE 1 TABLET AT BEDTIME 90 tablet 0   No facility-administered medications prior to visit.    Allergies  Allergen Reactions   Dilaudid [Hydromorphone Hcl] Itching    Review of Systems  Constitutional:  Positive for malaise/fatigue. Negative for fever.  HENT:  Negative for congestion.   Eyes:  Negative for blurred vision.  Respiratory:  Negative for shortness of breath.   Cardiovascular:  Negative for chest pain, palpitations and leg swelling.  Gastrointestinal:  Negative for abdominal pain, blood in stool and nausea.  Genitourinary:  Negative for dysuria and frequency.  Musculoskeletal:  Negative for falls.  Skin:  Negative for rash.  Neurological:  Negative for dizziness, loss of consciousness and headaches.  Endo/Heme/Allergies:  Negative for environmental allergies.  Psychiatric/Behavioral:  Positive for depression. The patient is nervous/anxious.       Objective:    Physical Exam Constitutional:      General: She is not in acute distress.    Appearance: She is well-developed.  HENT:     Head: Normocephalic and atraumatic.  Eyes:     Conjunctiva/sclera: Conjunctivae normal.  Neck:     Thyroid: No thyromegaly.  Cardiovascular:     Rate and Rhythm: Normal rate and regular rhythm.     Heart sounds: Normal heart sounds. No murmur heard. Pulmonary:     Effort: Pulmonary effort is normal. No respiratory distress.     Breath sounds: Normal breath sounds.  Abdominal:     General: Bowel sounds are normal. There is no distension.     Palpations: Abdomen is soft. There is no mass.     Tenderness: There is no abdominal tenderness.  Musculoskeletal:     Cervical back: Neck supple.  Lymphadenopathy:     Cervical: No cervical adenopathy.  Skin:    General: Skin is warm and dry.  Neurological:     Mental Status: She is alert and oriented to person, place, and time.  Psychiatric:         Behavior: Behavior normal.    BP 102/60    Pulse 68    Temp 97.9 F (36.6 C)    Resp 16    Ht 5' 2" (1.575 m)    Wt 166 lb 6.4 oz (75.5 kg)    LMP 01/30/1994    SpO2 91%    BMI 30.43 kg/m  Wt Readings from Last 3 Encounters:  03/01/21 166 lb 6.4 oz (75.5 kg)  01/13/21 169 lb (76.7 kg)  12/29/20 167 lb 12.8 oz (76.1 kg)    Diabetic Foot Exam - Simple   No data filed    Lab Results  Component Value Date   WBC 5.2 01/18/2021   HGB 11.4 (L) 01/18/2021   HCT 34.8 (L) 01/18/2021   PLT 198.0 01/18/2021   GLUCOSE 94 10/28/2020   CHOL 152 10/28/2020   TRIG 207.0 (H) 10/28/2020   HDL 52.20 10/28/2020   LDLDIRECT 78.0 10/28/2020   LDLCALC 80 06/05/2018   ALT 19 10/28/2020   AST 21 10/28/2020   NA 144 10/28/2020   K 4.1 10/28/2020   CL 106 10/28/2020  CREATININE 0.82 10/28/2020   BUN 15 10/28/2020   CO2 31 10/28/2020   TSH 3.11 10/28/2020   INR 1.0 07/12/2020   HGBA1C 5.6 01/18/2021    Lab Results  Component Value Date   TSH 3.11 10/28/2020   Lab Results  Component Value Date   WBC 5.2 01/18/2021   HGB 11.4 (L) 01/18/2021   HCT 34.8 (L) 01/18/2021   MCV 88.1 01/18/2021   PLT 198.0 01/18/2021   Lab Results  Component Value Date   NA 144 10/28/2020   K 4.1 10/28/2020   CHLORIDE 107 08/07/2016   CO2 31 10/28/2020   GLUCOSE 94 10/28/2020   BUN 15 10/28/2020   CREATININE 0.82 10/28/2020   BILITOT 0.3 10/28/2020   ALKPHOS 107 10/28/2020   AST 21 10/28/2020   ALT 19 10/28/2020   PROT 6.3 01/18/2021   ALBUMIN 4.0 10/28/2020   CALCIUM 9.4 10/28/2020   ANIONGAP 5 07/16/2020   EGFR 72 (L) 08/07/2016   GFR 69.49 10/28/2020   Lab Results  Component Value Date   CHOL 152 10/28/2020   Lab Results  Component Value Date   HDL 52.20 10/28/2020   Lab Results  Component Value Date   LDLCALC 80 06/05/2018   Lab Results  Component Value Date   TRIG 207.0 (H) 10/28/2020   Lab Results  Component Value Date   CHOLHDL 3 10/28/2020   Lab Results  Component  Value Date   HGBA1C 5.6 01/18/2021       Assessment & Plan:   Problem List Items Addressed This Visit     Hyperlipidemia, mixed    Encourage heart healthy diet such as MIND or DASH diet, increase exercise, avoid trans fats, simple carbohydrates and processed foods, consider a krill or fish or flaxseed oil cap daily. Tolerating Rosuvastatin      Vitamin D deficiency    Supplement and monitor      Urinary frequency    Is set up with urogyn and she is on Myrebetriq      Depression with anxiety    Anxiety and anhedonia are still present after several months on Venlafaxine xr 150 mg daily will increase to 225 mg daily and reevaluate she will let us know if any concerns      Relevant Medications   venlafaxine XR (EFFEXOR-XR) 75 MG 24 hr capsule   Dementia ([place])    She is following with neurology now and is on Aricept. She is not experiencing any side effects but is still anxious and upset about the diagnosis. Will continue to work with her to help her      Relevant Medications   venlafaxine XR (EFFEXOR-XR) 75 MG 24 hr capsule    I have changed Alycia Patten. Norland's venlafaxine XR. I am also having her maintain her pentosan polysulfate, ferrous sulfate, folic acid, cetirizine, acetaminophen, multivitamin with minerals, ergocalciferol, rosuvastatin, diltiazem, hydrOXYzine, omeprazole, famotidine, Melatonin, docusate sodium, Calcium Carbonate (CALCIUM 600 PO), telmisartan, mirabegron ER, donepezil, carvedilol, gabapentin, and albuterol.  Meds ordered this encounter  Medications   venlafaxine XR (EFFEXOR-XR) 75 MG 24 hr capsule    Sig: Take 3 capsules (225 mg total) by mouth daily with breakfast.    Dispense:  270 capsule    Refill:  1     Penni Homans, MD

## 2021-03-03 ENCOUNTER — Other Ambulatory Visit: Payer: Self-pay | Admitting: Family Medicine

## 2021-03-03 DIAGNOSIS — F339 Major depressive disorder, recurrent, unspecified: Secondary | ICD-10-CM

## 2021-03-03 DIAGNOSIS — F0670 Mild neurocognitive disorder due to known physiological condition without behavioral disturbance: Secondary | ICD-10-CM

## 2021-03-03 DIAGNOSIS — G309 Alzheimer's disease, unspecified: Secondary | ICD-10-CM

## 2021-03-04 NOTE — Telephone Encounter (Signed)
Lvm with [facility] number [phone]

## 2021-03-04 NOTE — Progress Notes (Signed)
HPI:FU nonischemic cardiomyopathy. Patient had an echocardiogram in 2012 ejection fraction of 45-50%. Reduced LV function felt secondary to chemotherapy (herceptin). Cardiac catheterization November 2015 showed no obstructive coronary disease. Ejection fraction 50-55%. Carotid Dopplers January 2018 showed 1-39% bilateral stenosis.  Echo repeated December 2021 showed normal LV function, grade 1 diastolic dysfunction.  Monitor September 2022 showed normal sinus rhythm with short runs of SVT up to 29.5 seconds, rare PVC, PAC and ventricular couplet.  Seen recently with palpitations and Cardizem was increased.  Since last seen, her palpitations are much improved.  She denies dyspnea, chest pain, or syncope.  Current Outpatient Medications  Medication Sig Dispense Refill   acetaminophen (TYLENOL) 500 MG tablet Take 1-2 tablets (500-1,000 mg total) by mouth every 8 (eight) hours as needed for moderate pain. 100 tablet 2   albuterol (VENTOLIN HFA) 108 (90 Base) MCG/ACT inhaler INHALE 1-2 PUFFS INTO THE LUNGS EVERY 6 (SIX) HOURS AS NEEDED FOR WHEEZING OR SHORTNESS OF BREATH. 18 each 3   Calcium Carbonate (CALCIUM 600 PO) Take 1 tablet by mouth in the morning and at bedtime.     carvedilol (COREG) 25 MG tablet TAKE 1 TABLET TWICE DAILY WITH A MEAL 180 tablet 3   cetirizine (ZYRTEC) 10 MG tablet Take 10 mg by mouth daily.     diltiazem (CARDIZEM CD) 120 MG 24 hr capsule Take 1 capsule (120 mg total) by mouth daily. 30 capsule 3   docusate sodium (COLACE) 250 MG capsule Take 250 mg by mouth daily as needed for constipation.     donepezil (ARICEPT) 10 MG tablet TAKE 1 TABLET AT BEDTIME 90 tablet 0   ergocalciferol (VITAMIN D2) 1.25 MG (50000 UT) capsule Take 2,000 Units by mouth daily.     famotidine (PEPCID) 20 MG tablet TAKE 1 TABLET BY MOUTH TWICE A DAY 180 tablet 1   ferrous sulfate 325 (65 FE) MG tablet Take 325 mg by mouth daily with breakfast.     folic acid (FOLVITE) 1 MG tablet Take 1 tablet (1  mg total) by mouth daily. 90 tablet 0   gabapentin (NEURONTIN) 300 MG capsule Take 1 capsule (300 mg total) by mouth 2 (two) times daily. 60 capsule 5   hydrOXYzine (ATARAX/VISTARIL) 25 MG tablet Take 0.5-1 tablets (12.5-25 mg total) by mouth 2 (two) times daily as needed. 60 tablet 1   Melatonin 10 MG CAPS Take 1 capsule by mouth at bedtime.     mirabegron ER (MYRBETRIQ) 25 MG TB24 tablet Take 1 tablet by mouth daily.     Multiple Vitamin (MULTIVITAMIN WITH MINERALS) TABS tablet Take 1 tablet by mouth daily.     omeprazole (PRILOSEC) 20 MG capsule Take 1 capsule (20 mg total) by mouth daily. 30 capsule 3   pentosan polysulfate (ELMIRON) 100 MG capsule Take 100 mg by mouth as directed. two at morning and two at night     rosuvastatin (CRESTOR) 20 MG tablet TAKE 1 TABLET AT BEDTIME 90 tablet 3   telmisartan (MICARDIS) 40 MG tablet Take 1 tablet (40 mg total) by mouth daily.     venlafaxine XR (EFFEXOR-XR) 75 MG 24 hr capsule Take 3 capsules (225 mg total) by mouth daily with breakfast. 270 capsule 1   No current facility-administered medications for this visit.     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 Echo 2012: EF 45-50%;  b. Lex MV 2/12:  low risk, apical defect (small area of ischemia vs shifting breast atten);  c.  Echo 7/12: Normal wall thickness, EF 60-65%, normal wall motion, grade 1 diastolic dysfunction, mild LAE, PASP 32;   d. Lex MV 11/13:  EF 76%, no ischemia   OA (osteoarthritis) 09/07/2011   Obesity 09/28/2014   Osteoporosis 03/07/2011   DEXA T score -2.6 AP spine 03/07/11    Formatting of this note might be different from the original. Formatting of this note might be different from the original. DEXA T score -2.6 AP spine 03/07/11   Last Assessment & Plan:  Formatting of this note might be different from the original. Encouraged to get adequate exercise, calcium and vitamin d intake   Pain in joint, ankle and foot 09/28/2014   Palpitation 10/28/2020   Personal history of chemotherapy 2012   Personal history of radiation therapy 2012   Prediabetes 06/15/2009   Recurrent falls 12/28/2015   Right knee pain 07/10/2020   Shortness of breath    UTI (urinary tract infection) 03/03/2012   Vertebral fracture, osteoporotic, sequela 04/05/2016   Vitamin D deficiency 06/14/2017   Supplement and monitor    Past Surgical History:  Procedure Laterality Date   BREAST LUMPECTOMY  04/2009   RIGHT FOR BREAST CANCER-CHEMO/RADIATION X 1 YEAR   BREAST REDUCTION SURGERY Bilateral 05/04/2014   Procedure: MAMMARY REDUCTION  (BREAST);  Surgeon: Cristine Polio, MD;  Location: [facility];  Service: Plastics;  Laterality: Bilateral;   CARPAL TUNNEL RELEASE     L wrist, ulnar nerve moved   CHOLECYSTECTOMY     CRANIOTOMY FOR TUMOR  2000   ELBOW SURGERY     left   KNEE ARTHROSCOPY Left 10/12/2014   Procedure: LEFT KNEE ARTHROSCOPY ;  Surgeon: Gaynelle Arabian, MD;  Location: [facility];  Service: Orthopedics;  Laterality: Left;   KYPHOPLASTY N/A 03/30/2016   Procedure: THORACIC 12 KYPHOPLASTY;  Surgeon: Phylliss Bob, MD;  Location: [facility];  Service: Orthopedics;  Laterality: N/A;  THORACIC 12 KYPHOPLASTY   LEFT HEART CATHETERIZATION WITH CORONARY ANGIOGRAM N/A 12/16/2013   Procedure: LEFT HEART CATHETERIZATION WITH CORONARY ANGIOGRAM;  Surgeon: Ander Slade  [name], MD;  Location: [facility] C[facility];  Service: Cardiovascular;  Laterality: N/A;   LIPOMA EXCISION  03/28/2009   right leg   MELANOMA EXCISION     PORT-A-CATH REMOVAL  11/30/2010   [place]   porta cath     PORTACATH PLACEMENT  may 2011   REDUCTION MAMMAPLASTY Bilateral    SYNOVECTOMY Left 10/12/2014   Procedure: WITH SYNOVECTOMY;  Surgeon: Gaynelle Arabian, MD;  Location: [facility];  Service: Orthopedics;  Laterality: Left;   TONSILLECTOMY  1958   TOTAL KNEE ARTHROPLASTY  02/05/2012   Procedure: TOTAL KNEE ARTHROPLASTY;  Surgeon: Gearlean Alf, MD;  Location: [facility];  Service: Orthopedics;  Laterality: Right;   TOTAL KNEE ARTHROPLASTY Left 02/10/2013   Procedure: LEFT TOTAL KNEE ARTHROPLASTY;  Surgeon: Gearlean Alf, MD;  Location: [facility];  Service: Orthopedics;  Laterality: Left;   total knee raplacement  01-2012   Right Knee   TOTAL KNEE REVISION Left 07/14/2020   Procedure: TOTAL KNEE REVISION;  Surgeon: Gaynelle Arabian, MD;  Location: [facility];  Service: Orthopedics;  Laterality: Left;   TUBAL LIGATION  1997    Social History   Socioeconomic History   Marital status: Married    Spouse name: Jimena Wieczorek   Number of children: 3   Years of education: 12   Highest education level: High school graduate  Occupational  History   Occupation: boutique owner-retired    Employer: [employer]  Tobacco Use   Smoking status: Never    Passive exposure: Never   Smokeless tobacco: Never  Vaping Use   Vaping Use: Never used  Substance and Sexual Activity   Alcohol use: No   Drug use: No   Sexual activity: Not Currently  Other Topics Concern   Not on file  Social History Narrative   Patient is right-handed. She lives with her husband in a 4 story home. They take care of their grown son with down syndrome who has had a stroke. She drinks 1-2 glasses of tea in the evenings. She does not formally exercise.      Social Determinants of Health   Financial Resource Strain: Low Risk    Difficulty of Paying Living Expenses: Not very hard  Food Insecurity: No Food Insecurity   Worried About Charity fundraiser in the Last Year: Never true   Ran Out of Food in the Last Year: Never true  Transportation Needs: No Transportation Needs   Lack of Transportation (Medical): No   Lack of Transportation (Non-Medical): No  Physical Activity: Inactive   Days of Exercise per Week: 0 days   Minutes of Exercise per Session: 0 min  Stress: Stress Concern Present   Feeling of Stress : Very much  Social Connections: Socially Integrated   Frequency of Communication with Friends and Family: More than three times a week   Frequency of Social Gatherings with Friends and Family: More than three times a week   Attends Religious Services: 1 to 4 times per year   Active Member of [employer] or Organizations: Yes   Attends Archivist Meetings: 1 to 4 times per year   Marital Status: Married  Human resources officer Violence: Not At Risk   Fear of Current or Ex-Partner: No   Emotionally Abused: No   Physically Abused: No   Sexually Abused: No    Family History  Problem Relation Age of Onset   Breast cancer Mother        sarcoma   Lung cancer Mother  Hypertension Mother    Prostate cancer Father    Congestive Heart Failure  Father    Heart attack Father    Prostate cancer Brother    Heart disease Maternal Grandfather        MI   Down syndrome Son    CVA Son    Stomach cancer Maternal Aunt    Dementia Maternal Aunt    Uterine cancer Maternal Aunt    Colon cancer Neg Hx     ROS: no fevers or chills, productive cough, hemoptysis, dysphasia, odynophagia, melena, hematochezia, dysuria, hematuria, rash, seizure activity, orthopnea, PND, pedal edema, claudication. Remaining systems are negative.  Physical Exam: Well-developed well-nourished in no acute distress.  Skin is warm and dry.  HEENT is normal.  Neck is supple.  Chest is clear to auscultation with normal expansion.  Cardiovascular exam is regular rate and rhythm.  Abdominal exam nontender or distended. No masses palpated. Extremities show no edema. neuro grossly intact   A/P  1 nonischemic cardiomyopathy-LV function has improved on most recent echocardiogram.  Plan to continue carvedilol and micardis at present dose.  2 palpitations-symptoms much improved compared to previous office visit.  We will continue Coreg and Cardizem at present dose.  3 hypertension-blood pressure controlled.  Continue present medications and follow.  4 chronic diastolic congestive heart failure-euvolemic on examination.  Continue fluid restriction and low-sodium diet.  5 hyperlipidemia-continue statin.  Kirk Ruths, MD

## 2021-03-14 DIAGNOSIS — N3010 Interstitial cystitis (chronic) without hematuria: Secondary | ICD-10-CM | POA: Diagnosis not present

## 2021-03-14 DIAGNOSIS — R82998 Other abnormal findings in urine: Secondary | ICD-10-CM | POA: Diagnosis not present

## 2021-03-16 ENCOUNTER — Other Ambulatory Visit: Payer: Self-pay

## 2021-03-16 ENCOUNTER — Ambulatory Visit: Payer: Medicare Other | Admitting: *Deleted

## 2021-03-16 ENCOUNTER — Encounter: Payer: Self-pay | Admitting: Cardiology

## 2021-03-16 ENCOUNTER — Ambulatory Visit (INDEPENDENT_AMBULATORY_CARE_PROVIDER_SITE_OTHER): Payer: Medicare Other | Admitting: Cardiology

## 2021-03-16 VITALS — BP 124/72 | HR 77 | Ht 62.0 in | Wt 170.0 lb

## 2021-03-16 DIAGNOSIS — E669 Obesity, unspecified: Secondary | ICD-10-CM

## 2021-03-16 DIAGNOSIS — F339 Major depressive disorder, recurrent, unspecified: Secondary | ICD-10-CM

## 2021-03-16 DIAGNOSIS — I420 Dilated cardiomyopathy: Secondary | ICD-10-CM

## 2021-03-16 DIAGNOSIS — R002 Palpitations: Secondary | ICD-10-CM | POA: Diagnosis not present

## 2021-03-16 DIAGNOSIS — R296 Repeated falls: Secondary | ICD-10-CM

## 2021-03-16 DIAGNOSIS — E782 Mixed hyperlipidemia: Secondary | ICD-10-CM | POA: Diagnosis not present

## 2021-03-16 DIAGNOSIS — R202 Paresthesia of skin: Secondary | ICD-10-CM

## 2021-03-16 DIAGNOSIS — F418 Other specified anxiety disorders: Secondary | ICD-10-CM

## 2021-03-16 DIAGNOSIS — I10 Essential (primary) hypertension: Secondary | ICD-10-CM

## 2021-03-16 DIAGNOSIS — M810 Age-related osteoporosis without current pathological fracture: Secondary | ICD-10-CM

## 2021-03-16 DIAGNOSIS — D444 Neoplasm of uncertain behavior of craniopharyngeal duct: Secondary | ICD-10-CM

## 2021-03-16 DIAGNOSIS — F0670 Mild neurocognitive disorder due to known physiological condition without behavioral disturbance: Secondary | ICD-10-CM

## 2021-03-16 NOTE — Patient Instructions (Signed)

## 2021-03-17 NOTE — Patient Instructions (Signed)
Visit Information  Thank you for taking time to visit with me today. Please don't hesitate to contact me if I can be of assistance to you before our next scheduled telephone appointment.  Following are the goals we discussed today:  Patient Goals/Self-Care Activities: Continue to receive personal counseling with LCSW, on a bi-weekly basis, to reduce and manage symptoms of Anxiety and Depression, until re-established with Clint Bolder, LCSW with [facility].   Keep scheduled appointments with Clint Bolder, LCSW with [place] [phone]), in an effort to receive ongoing mental health counseling and supportive services, as well as reduce and manage symptoms of Anxiety and Depression.   Continue to review "Techniques for Coping with a New Dementia Diagnosis", and implement into daily practice. Review "Sharing Your Diagnosis", and use the following key points when disclosing your diagnosis of Alzheimer's/Dementia to family members, friends, loved ones, neighbors, and congregation members: ~ Why Tell? ~ Deciding Who to Tell? ~ How to Share Your Diagnosis? ~ Tips from Early-Stage Individuals. ~ Responses to Diagnosis.     Contact LCSW directly (# [phone]), if you have questions, need assistance, or if additional social work needs are identified between now and our next scheduled telephone outreach call.  Follow-Up:  03/25/2021 at 1:30 pm  Please call the care guide team at [phone] if you need to cancel or reschedule your appointment.   If you are experiencing a Mental Health or [place] or need someone to talk to, please call the Suicide and Crisis Lifeline: 988 call the [place] National Suicide Prevention Lifeline: 830-585-2544 or TTY: 709-332-9661 TTY 812-673-7500) to talk to a trained counselor call 1-800-273-TALK (toll free, 24 hour hotline) go to [facility] [address] [place]  [phone]) call the [place]: [phone] call 911   Patient verbalizes understanding of instructions and care plan provided today and agrees to view in [place]. Active MyChart status confirmed with patient.    [place] Licensed Clinical Social Worker [facility] [phone]

## 2021-03-17 NOTE — Chronic Care Management (AMB) (Signed)
Chronic Care Management    Clinical Social Work Note  03/17/2021 Name: Shannon [name] MRN: [id number] DOB: Aug 28, 1945  Shannon [name] is a 76 y.o. year old female who is a primary care patient of Mosie Lukes, MD. The CCM team was consulted to assist the patient with chronic disease management and/or care coordination needs related to: [employer] and [place] and Resources.   Engaged with patient by telephone for follow up visit in response to provider referral for social work chronic care management and care coordination services.   Consent to Services:  The patient was given information about Chronic Care Management services, agreed to services, and gave verbal consent prior to initiation of services.  Please see initial visit note for detailed documentation.   Patient agreed to services and consent obtained.   Assessment: Review of patient past medical history, allergies, medications, and health status, including review of relevant consultants reports was performed today as part of a comprehensive evaluation and provision of chronic care management and care coordination services.     SDOH (Social Determinants of Health) assessments and interventions performed:    Advanced Directives Status: Not addressed in this encounter.  CCM Care Plan  Allergies  Allergen Reactions   Dilaudid [Hydromorphone Hcl] Itching    Outpatient Encounter Medications as of 03/16/2021  Medication Sig   acetaminophen (TYLENOL) 500 MG tablet Take 1-2 tablets (500-1,000 mg total) by mouth every 8 (eight) hours as needed for moderate pain.   albuterol (VENTOLIN HFA) 108 (90 Base) MCG/ACT inhaler INHALE 1-2 PUFFS INTO THE LUNGS EVERY 6 (SIX) HOURS AS NEEDED FOR WHEEZING OR SHORTNESS OF BREATH.   Calcium Carbonate (CALCIUM 600 PO) Take 1 tablet by mouth in the morning and at bedtime.   carvedilol (COREG) 25 MG tablet TAKE 1 TABLET TWICE DAILY WITH A MEAL   cetirizine (ZYRTEC) 10 MG tablet  Take 10 mg by mouth daily.   diltiazem (CARDIZEM CD) 120 MG 24 hr capsule Take 1 capsule (120 mg total) by mouth daily.   docusate sodium (COLACE) 250 MG capsule Take 250 mg by mouth daily as needed for constipation.   donepezil (ARICEPT) 10 MG tablet TAKE 1 TABLET AT BEDTIME   ergocalciferol (VITAMIN D2) 1.25 MG (50000 UT) capsule Take 2,000 Units by mouth daily.   famotidine (PEPCID) 20 MG tablet TAKE 1 TABLET BY MOUTH TWICE A DAY   ferrous sulfate 325 (65 FE) MG tablet Take 325 mg by mouth daily with breakfast.   folic acid (FOLVITE) 1 MG tablet Take 1 tablet (1 mg total) by mouth daily.   gabapentin (NEURONTIN) 300 MG capsule Take 1 capsule (300 mg total) by mouth 2 (two) times daily.   hydrOXYzine (ATARAX/VISTARIL) 25 MG tablet Take 0.5-1 tablets (12.5-25 mg total) by mouth 2 (two) times daily as needed.   Melatonin 10 MG CAPS Take 1 capsule by mouth at bedtime.   mirabegron ER (MYRBETRIQ) 25 MG TB24 tablet Take 1 tablet by mouth daily.   Multiple Vitamin (MULTIVITAMIN WITH MINERALS) TABS tablet Take 1 tablet by mouth daily.   omeprazole (PRILOSEC) 20 MG capsule Take 1 capsule (20 mg total) by mouth daily.   pentosan polysulfate (ELMIRON) 100 MG capsule Take 100 mg by mouth as directed. two at morning and two at night   rosuvastatin (CRESTOR) 20 MG tablet TAKE 1 TABLET AT BEDTIME   telmisartan (MICARDIS) 40 MG tablet Take 1 tablet (40 mg total) by mouth daily.   venlafaxine XR (EFFEXOR-XR) 75 MG  24 hr capsule Take 3 capsules (225 mg total) by mouth daily with breakfast.   No facility-administered encounter medications on file as of 03/16/2021.    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   Right knee pain  07/10/2020   Urinary frequency 02/11/2020   Debility 12/23/2017   Left knee pain 12/23/2017   Vitamin D deficiency 06/14/2017   Vertebral fracture, osteoporotic 04/05/2016   Left-sided back pain 03/06/2016   Chest pain 02/27/2016   Syncope 02/27/2016   Cataracts, bilateral 12/28/2015   Recurrent falls 12/28/2015   Family history- stomach cancer 09/08/2015   Family history of cancer 09/08/2015   [name] lesion 06/21/2015   Obesity 09/28/2014   Interstitial cystitis 09/28/2014   Pain in joint, ankle and foot 09/28/2014   Abnormal nuclear stress test 12/16/2013   Sternal pain 11/06/2013   Postoperative anemia due to acute blood loss 02/13/2013   Congestive dilated cardiomyopathy ([place]) 12/30/2012   Breast cancer of lower-inner quadrant of right female breast 10/21/2012   Constipation 03/03/2012   OA (osteoarthritis) 09/07/2011   Dermatitis    Hyponatremia 08/28/2011   Osteoporosis 03/07/2011   Prediabetes 06/15/2009   Lipoma 02/10/2009   Dyspnea on exertion 02/10/2009   Hyperlipidemia, mixed 08/11/2008   Major depressive disorder with anxious distress 08/11/2008   Essential hypertension 08/11/2008   GERD (gastroesophageal reflux disease) 08/11/2008   Craniopharyngioma 2000    Conditions to be addressed/monitored: Anxiety, Depression, and Dementia.  Mental Health Concerns, Cognitive Deficits, Memory Deficits, and Lacks Knowledge of [employer].  Care Plan : LCSW Plan of Care  Updates made by Francis Gaines, LCSW since 03/17/2021 12:00 AM     Problem: Reduce and Manage My Symptoms of Anxiety and Depression.   Priority: High     Goal: Reduce and Manage My Symptoms of Anxiety and Depression.   Start Date: 12/30/2020  Expected End Date: 03/30/2021  This Visit's Progress: On track  Recent Progress: On track  Priority: High  Note:   Current Barriers:   Acute Mental Health needs related to Debility, Obesity, Major Depressive Disorder with Anxious Distress,  and Mild Neurocognitive Disorder due to Alzheimer's Disease, requires Support, Education, Resources, Referrals, Advocacy, and Care Coordination, in order to meet unmet mental health needs. Clinical Goal(s):  Patient will work with LCSW to reduce and manage symptoms of Anxiety and Depression, until re-established with a community mental health provider.     Patient will increase knowledge and/or ability of:        Coping Skills, Healthy Habits, Self-Management Skills, Stress Reduction, Home Safety and Utilizing [employer] and Resources.   Interventions: Collaboration with Primary Care Physician, Dr. Penni Homans regarding development and update of comprehensive plan of care as evidenced by provider attestation and co-signature. Collaboration with Primary Care Physician, Dr. Penni Homans to discuss prescribing a medication to slow the progression of Alzheimer's/Dementia symptoms, per patient's request. Inter-disciplinary care team collaboration (see longitudinal plan of care). Clinical Interventions:  Solution-Focused Therapy Performed. Emotional Support Provided and Verbalization of Feelings Encouraged. Problem Solving Solutions Developed. Cognitive Behavioral Therapy Initiated. Psychotropic Medication Regimen Reviewed and Compliance Discussed.      Patient Goals/Self-Care Activities: Continue to receive personal counseling with LCSW, on a bi-weekly basis, to  reduce and manage symptoms of Anxiety and Depression, until re-established with Clint Bolder, LCSW with [place].   Keep scheduled appointments with Clint Bolder, LCSW with [facility] [phone]), in an effort to receive ongoing mental health counseling and supportive services, as well as reduce and manage symptoms of Anxiety and Depression.   Continue to review "Techniques for Coping with a New Dementia Diagnosis", and implement into daily practice. Review "Sharing Your Diagnosis", and use the  following key points when disclosing your diagnosis of Alzheimer's/Dementia to family members, friends, loved ones, neighbors, and congregation members: ~ Why Tell? ~ Deciding Who to Tell? ~ How to Share Your Diagnosis? ~ Tips from Early-Stage Individuals. ~ Responses to Diagnosis.     Contact LCSW directly (# [phone]), if you have questions, need assistance, or if additional social work needs are identified between now and our next scheduled telephone outreach call.  Follow-Up:  03/25/2021 at 1:30 pm      Nat Christen [place] Clinical Social Worker [facility] [phone]

## 2021-03-18 ENCOUNTER — Ambulatory Visit: Payer: Medicare Other

## 2021-03-18 DIAGNOSIS — E782 Mixed hyperlipidemia: Secondary | ICD-10-CM

## 2021-03-18 DIAGNOSIS — I10 Essential (primary) hypertension: Secondary | ICD-10-CM

## 2021-03-18 NOTE — Chronic Care Management (AMB) (Signed)
Chronic Care Management   CCM RN Visit Note  03/18/2021 Name: Shannon [name] MRN: [id number] DOB: [birth year]  Subjective: Shannon [name] is a 76 y.o. year old female who is a primary care patient of Mosie Lukes, MD. The care management team was consulted for assistance with disease management and care coordination needs.    Engaged with patient by telephone for follow up visit in response to provider referral for case management and/or care coordination services.   Consent to Services:  The patient was given information about Chronic Care Management services, agreed to services, and gave verbal consent prior to initiation of services.  Please see initial visit note for detailed documentation.   Patient agreed to services and verbal consent obtained.   Assessment: Review of patient past medical history, allergies, medications, health status, including review of consultants reports, laboratory and other test data, was performed as part of comprehensive evaluation and provision of chronic care management services.   SDOH (Social Determinants of Health) assessments and interventions performed:    CCM Care Plan  Allergies  Allergen Reactions   Dilaudid [Hydromorphone Hcl] Itching    Outpatient Encounter Medications as of 03/18/2021  Medication Sig   acetaminophen (TYLENOL) 500 MG tablet Take 1-2 tablets (500-1,000 mg total) by mouth every 8 (eight) hours as needed for moderate pain.   albuterol (VENTOLIN HFA) 108 (90 Base) MCG/ACT inhaler INHALE 1-2 PUFFS INTO THE LUNGS EVERY 6 (SIX) HOURS AS NEEDED FOR WHEEZING OR SHORTNESS OF BREATH.   Calcium Carbonate (CALCIUM 600 PO) Take 1 tablet by mouth in the morning and at bedtime.   carvedilol (COREG) 25 MG tablet TAKE 1 TABLET TWICE DAILY WITH A MEAL   cetirizine (ZYRTEC) 10 MG tablet Take 10 mg by mouth daily.   diltiazem (CARDIZEM CD) 120 MG 24 hr capsule Take 1 capsule (120 mg total) by mouth daily.   docusate sodium (COLACE) 250 MG  capsule Take 250 mg by mouth daily as needed for constipation.   donepezil (ARICEPT) 10 MG tablet TAKE 1 TABLET AT BEDTIME   ergocalciferol (VITAMIN D2) 1.25 MG (50000 UT) capsule Take 2,000 Units by mouth daily.   famotidine (PEPCID) 20 MG tablet TAKE 1 TABLET BY MOUTH TWICE A DAY   ferrous sulfate 325 (65 FE) MG tablet Take 325 mg by mouth daily with breakfast.   folic acid (FOLVITE) 1 MG tablet Take 1 tablet (1 mg total) by mouth daily.   gabapentin (NEURONTIN) 300 MG capsule Take 1 capsule (300 mg total) by mouth 2 (two) times daily.   hydrOXYzine (ATARAX/VISTARIL) 25 MG tablet Take 0.5-1 tablets (12.5-25 mg total) by mouth 2 (two) times daily as needed.   Melatonin 10 MG CAPS Take 1 capsule by mouth at bedtime.   mirabegron ER (MYRBETRIQ) 25 MG TB24 tablet Take 1 tablet by mouth daily.   Multiple Vitamin (MULTIVITAMIN WITH MINERALS) TABS tablet Take 1 tablet by mouth daily.   omeprazole (PRILOSEC) 20 MG capsule Take 1 capsule (20 mg total) by mouth daily.   pentosan polysulfate (ELMIRON) 100 MG capsule Take 100 mg by mouth as directed. two at morning and two at night   rosuvastatin (CRESTOR) 20 MG tablet TAKE 1 TABLET AT BEDTIME   telmisartan (MICARDIS) 40 MG tablet Take 1 tablet (40 mg total) by mouth daily.   venlafaxine XR (EFFEXOR-XR) 75 MG 24 hr capsule Take 3 capsules (225 mg total) by mouth daily with breakfast.   No facility-administered encounter medications on file as of 03/18/2021.  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   Right knee pain 07/10/2020   Urinary frequency 02/11/2020   Debility 12/23/2017   Left knee pain 12/23/2017   Vitamin D deficiency 06/14/2017   Vertebral fracture,  osteoporotic 04/05/2016   Left-sided back pain 03/06/2016   Chest pain 02/27/2016   Syncope 02/27/2016   Cataracts, bilateral 12/28/2015   Recurrent falls 12/28/2015   Family history- stomach cancer 09/08/2015   Family history of cancer 09/08/2015   [name] lesion 06/21/2015   Obesity 09/28/2014   Interstitial cystitis 09/28/2014   Pain in joint, ankle and foot 09/28/2014   Abnormal nuclear stress test 12/16/2013   Sternal pain 11/06/2013   Postoperative anemia due to acute blood loss 02/13/2013   Congestive dilated cardiomyopathy ([place]) 12/30/2012   Breast cancer of lower-inner quadrant of right female breast 10/21/2012   Constipation 03/03/2012   OA (osteoarthritis) 09/07/2011   Dermatitis    Hyponatremia 08/28/2011   Osteoporosis 03/07/2011   Prediabetes 06/15/2009   Lipoma 02/10/2009   Dyspnea on exertion 02/10/2009   Hyperlipidemia, mixed 08/11/2008   Major depressive disorder with anxious distress 08/11/2008   Essential hypertension 08/11/2008   GERD (gastroesophageal reflux disease) 08/11/2008   Craniopharyngioma 2000    Conditions to be addressed/monitored:HTN, HLD, Anxiety, Depression, and Mild Neurocognitive Disorder  Care Plan : RN Case Manager Plan of Care  Updates made by Luretha Rued, RN since 03/18/2021 12:00 AM     Problem: No Plan of Care Established for Managment of Chronic Disease (Neurocognitive Disorder, Anxiety/Depression)   Priority: High     Long-Range Goal: Development of Plan of Care for Chronic Disease Management (Neurocognitive Disorder, Anxiety/Depression)   Start Date: 12/13/2020  Expected End Date: 09/15/2021  Priority: High  Note:   Current Barriers: Denies any questions or concerns.  Knowledge Deficits related to plan of care for management of neurocognitive disorder, anxiety/depression  Cognitive Deficits/Memory loss-reports strategies for managing memory loss, keeping notes, writing things down. Medication  management-improved. Daughter assist with medication management.   RNCM Clinical Goal(s):  Patient will verbalize understanding of plan for management of Anxiety, Depression, and Dementia as evidenced by taking medications as prescribed, following up with provider appointments as schedule, engaging with LCSW and/or referred specialist. take all medications exactly as prescribed and will call provider for medication related questions as evidenced by medication adherance    work with pharmacist to address medications assistance program if available for [place] and follow up on medication managment related to chronic disease as evidenced by review of EMR and patient or pharmacist report    work with Education officer, museum to address Mental Health Concerns , Cognitive Deficits, and Memory Deficits related to the management of Anxiety and Depression as evidenced by review of EMR and patient or social worker report     through collaboration with Consulting civil engineer, provider, and care team.   Interventions: 1:1 collaboration with primary care provider regarding development and update of comprehensive plan of care as evidenced by provider attestation and co-signature Inter-disciplinary care team collaboration (see longitudinal plan of care) Evaluation of current treatment plan related to  self management and patient's adherence to plan as established by provider  Interstitial Cystitis  (Status:  Goal on track:  Yes.)  Long Term Goal  Evaluation of current treatment plan related to interstitial cystitis and patient's adherence to plan as established by provider  Medications reviewed and encouraged to continue to take as prescribed. Contact provider with any questions or concerns. Patient reports improved.   Hyperlipidemia Interventions:  (Status:  Goal on track:  Yes.) Long Term Goal Medication review performed; medication list updated in electronic medical record.  Reiterated PCP recommendations: a heart healthy  diet such as MIND or DASH, increase exercise, avoid trans fats and simple carbohydrate and processed foods, consider a krill or fish oil capsule daily. Discussed ground flax seed can added to foods such as greek yogurt and oat meal    Reviewed upcoming/scheduled appointments with patient Encouraged to begin exercise activity such as walking  Hypertension Interventions:  (Status:  New goal.) Long Term Goal Home reading today 125/60 HR 63 Last practice recorded BP readings:  BP Readings from Last 3 Encounters:  03/16/21 124/72  03/01/21 102/60  01/13/21 (!) 144/69  Most recent eGFR/CrCl:  Lab Results  Component Value Date   EGFR 72 (L) 08/07/2016    No components found for: CRCL  Reviewed medications with patient and discussed importance of compliance Encouraged to continue to take medications as scheduled Encouraged to continue to check and record blood pressure routinely  Discussed healthy eating  Dementia InterventionsGoal on track:  Yes. Long Term Evaluation of current treatment plan related to Dementia, Memory Deficits self-management and patient's adherence to plan as established by provider. Encouraged to increase activities such as walking.  Reiterated importance of eating a healthy diet Discussed plans with patient for ongoing care management follow up and provided patient with direct contact information for care management team Reviewed next follow up appointment with Neurologist scheduled for 06/2021.  Anxiety/Depression InterventionsGoal on track:  Yes. Long term Evaluation of current treatment plan related to Anxiety and Depression, Memory Deficits self-management and patient's adherence to plan as established by provider. Continues to work with embedded LCSW Medications reviewed and encouraged to take as prescribed. Patient reports Venlafaxine increased to 225 mg daily. Mrs. Spearman reports it is too soon to tell if increase has helped. Encouraged to begin some type of  exercise program.     Patient Goals/Self-Care Activities: Take medications as prescribed   Attend all scheduled provider appointments Call provider office for new concerns or questions  Continue to Eat Healthy: fruits, vegetables, lean meat, healthy fats; avoid trans fats and simple carbohydrate and processed foods, consider a krill, fish oil or flaxseed capsule daily. Discussed ground flax seed can added to foods such as greek yogurt and oat meal.  Write down a few goals (short term and long term) that you would like to accomplish.  Do some type of activity you enjoy every day. Begin exercise program per provider recommendations. Continue to work with Licensed clinical social worker and clinical pharmacist   Plan:Telephone follow up appointment with care management team member scheduled for:  04/15/21 The patient has been provided with contact information for the care management team and has been advised to call with any health related questions or concerns.   Thea Silversmith, RN, MSN, BSN, CCM Care Management Coordinator [facility] [phone]

## 2021-03-18 NOTE — Patient Instructions (Addendum)
Visit Information  Thank you for taking time to visit with me today. Please don't hesitate to contact me if I can be of assistance to you before our next scheduled telephone appointment.  Following are the goals we discussed today:  Patient Goals/Self-Care Activities: Take medications as prescribed   Attend all scheduled provider appointments Call provider office for new concerns or questions  Continue to Eat Healthy: fruits, vegetables, lean meat, healthy fats; avoid trans fats and simple carbohydrate and processed foods, consider a krill, fish oil or flaxseed capsule daily. Discussed ground flax seed can added to foods such as greek yogurt and oat meal.  Write down a few goals (short term and long term) that you would like to accomplish.  Do some type of activity you enjoy every day. Begin exercise program per provider recommendations. Continue to work with Licensed clinical social worker and clinical pharmacist  Our next appointment is by telephone on 04/15/21 at 2:00 pm  Please call the care guide team at [phone] if you need to cancel or reschedule your appointment.   If you are experiencing a Mental Health or [place] or need someone to talk to, please call the Suicide and Crisis Lifeline: 988 call 1-800-273-TALK (toll free, 24 hour hotline)   Patient verbalizes understanding of instructions and care plan provided today and agrees to view in [place]. Active MyChart status confirmed with patient.    Thea Silversmith, RN, MSN, BSN, CCM Care Management Coordinator [facility] [phone]   DASH Eating Plan [place] stands for Dietary Approaches to Stop Hypertension. The DASH eating plan is a healthy eating plan that has been shown to: Reduce high blood pressure (hypertension). Reduce your risk for type 2 diabetes, heart disease, and stroke. Help with weight loss. What are tips for following this plan? Reading food labels Check food labels for the  amount of salt (sodium) per serving. Choose foods with less than 5 percent of the Daily Value of sodium. Generally, foods with less than 300 milligrams (mg) of sodium per serving fit into this eating plan. To find whole grains, look for the word "whole" as the first word in the ingredient list. Shopping Buy products labeled as "low-sodium" or "no salt added." Buy fresh foods. Avoid canned foods and pre-made or frozen meals. Cooking Avoid adding salt when cooking. Use salt-free seasonings or herbs instead of table salt or sea salt. Check with your health care provider or pharmacist before using salt substitutes. Do not fry foods. Cook foods using healthy methods such as baking, boiling, grilling, roasting, and broiling instead. Cook with heart-healthy oils, such as olive, canola, avocado, soybean, or sunflower oil. Meal planning  Eat a balanced diet that includes: 4 or more servings of fruits and 4 or more servings of vegetables each day. Try to fill one-half of your plate with fruits and vegetables. 6-8 servings of whole grains each day. Less than 6 oz (170 g) of lean meat, poultry, or fish each day. A 3-oz (85-g) serving of meat is about the same size as a deck of cards. One egg equals 1 oz (28 g). 2-3 servings of low-fat dairy each day. One serving is 1 cup (237 mL). 1 serving of nuts, seeds, or beans 5 times each week. 2-3 servings of heart-healthy fats. Healthy fats called omega-3 fatty acids are found in foods such as walnuts, flaxseeds, fortified milks, and eggs. These fats are also found in cold-water fish, such as sardines, salmon, and mackerel. Limit how much you  eat of: Canned or prepackaged foods. Food that is high in trans fat, such as some fried foods. Food that is high in saturated fat, such as fatty meat. Desserts and other sweets, sugary drinks, and other foods with added sugar. Full-fat dairy products. Do not salt foods before eating. Do not eat more than 4 egg yolks a  week. Try to eat at least 2 vegetarian meals a week. Eat more home-cooked food and less restaurant, buffet, and fast food. Lifestyle When eating at a restaurant, ask that your food be prepared with less salt or no salt, if possible. If you drink alcohol: Limit how much you use to: 0-1 drink a day for women who are not pregnant. 0-2 drinks a day for men. Be aware of how much alcohol is in your drink. In the [place] one drink equals one 12 oz bottle of beer (355 mL), one 5 oz glass of wine (148 mL), or one 1 oz glass of hard liquor (44 mL). General information Avoid eating more than 2,300 mg of salt a day. If you have hypertension, you may need to reduce your sodium intake to 1,500 mg a day. Work with your health care provider to maintain a healthy body weight or to lose weight. Ask what an ideal weight is for you. Get at least 30 minutes of exercise that causes your heart to beat faster (aerobic exercise) most days of the week. Activities may include walking, swimming, or biking. Work with your health care provider or dietitian to adjust your eating plan to your individual calorie needs. What foods should I eat? Fruits All fresh, dried, or frozen fruit. Canned fruit in natural juice (without added sugar). Vegetables Fresh or frozen vegetables (raw, steamed, roasted, or grilled). Low-sodium or reduced-sodium tomato and vegetable juice. Low-sodium or reduced-sodium tomato sauce and tomato paste. Low-sodium or reduced-sodium canned vegetables. Grains Whole-grain or whole-wheat bread. Whole-grain or whole-wheat pasta. Brown rice. [place]. Bulgur. Whole-grain and low-sodium cereals. Pita bread. Low-fat, low-sodium crackers. Whole-wheat flour tortillas. Meats and other proteins Skinless chicken or [place]. Ground chicken or [place]. Pork with fat trimmed off. Fish and seafood. Egg whites. Dried beans, peas, or lentils. Unsalted nuts, nut butters, and seeds. Unsalted canned beans. Lean cuts of  beef with fat trimmed off. Low-sodium, lean precooked or cured meat, such as sausages or meat loaves. Dairy Low-fat (1%) or fat-free (skim) milk. Reduced-fat, low-fat, or fat-free cheeses. Nonfat, low-sodium ricotta or cottage cheese. Low-fat or nonfat yogurt. Low-fat, low-sodium cheese. Fats and oils Soft margarine without trans fats. Vegetable oil. Reduced-fat, low-fat, or light mayonnaise and salad dressings (reduced-sodium). Canola, safflower, olive, avocado, soybean, and sunflower oils. Avocado. Seasonings and condiments Herbs. Spices. Seasoning mixes without salt. Other foods Unsalted popcorn and pretzels. Fat-free sweets. The items listed above may not be a complete list of foods and beverages you can eat. Contact a dietitian for more information. What foods should I avoid? Fruits Canned fruit in a light or heavy syrup. Fried fruit. Fruit in cream or butter sauce. Vegetables Creamed or fried vegetables. Vegetables in a cheese sauce. Regular canned vegetables (not low-sodium or reduced-sodium). Regular canned tomato sauce and paste (not low-sodium or reduced-sodium). Regular tomato and vegetable juice (not low-sodium or reduced-sodium). Angie Fava. Olives. Grains Baked goods made with fat, such as croissants, muffins, or some breads. Dry pasta or rice meal packs. Meats and other proteins Fatty cuts of meat. Ribs. Fried meat. Berniece Salines. Bologna, salami, and other precooked or cured meats, such as sausages or meat loaves.  Fat from the back of a pig (fatback). Bratwurst. Salted nuts and seeds. Canned beans with added salt. Canned or smoked fish. Whole eggs or egg yolks. Chicken or [place] with skin. Dairy Whole or 2% milk, cream, and half-and-half. Whole or full-fat cream cheese. Whole-fat or sweetened yogurt. Full-fat cheese. Nondairy creamers. Whipped toppings. Processed cheese and cheese spreads. Fats and oils Butter. Stick margarine. Lard. Shortening. Ghee. Bacon fat. Tropical oils, such as  coconut, palm kernel, or palm oil. Seasonings and condiments Onion salt, garlic salt, seasoned salt, table salt, and sea salt. Worcestershire sauce. Tartar sauce. Barbecue sauce. Teriyaki sauce. Soy sauce, including reduced-sodium. Steak sauce. Canned and packaged gravies. Fish sauce. Oyster sauce. Cocktail sauce. Store-bought horseradish. Ketchup. Mustard. Meat flavorings and tenderizers. Bouillon cubes. Hot sauces. Pre-made or packaged marinades. Pre-made or packaged taco seasonings. Relishes. Regular salad dressings. Other foods Salted popcorn and pretzels. The items listed above may not be a complete list of foods and beverages you should avoid. Contact a dietitian for more information. Where to find more information National Heart, Lung, and Blood Institute: https://wilson-eaton.com/ American Heart Association: www.heart.org Academy of Nutrition and Dietetics: www.eatright.[place]: www.kidney.org Summary The DASH eating plan is a healthy eating plan that has been shown to reduce high blood pressure (hypertension). It may also reduce your risk for type 2 diabetes, heart disease, and stroke. When on the DASH eating plan, aim to eat more fresh fruits and vegetables, whole grains, lean proteins, low-fat dairy, and heart-healthy fats. With the DASH eating plan, you should limit salt (sodium) intake to 2,300 mg a day. If you have hypertension, you may need to reduce your sodium intake to 1,500 mg a day. Work with your health care provider or dietitian to adjust your eating plan to your individual calorie needs. This information is not intended to replace advice given to you by your health care provider. Make sure you discuss any questions you have with your health care provider. Document Revised: 12/20/2018 Document Reviewed: 12/20/2018 Elsevier Patient Education  2022 [employer].

## 2021-03-23 ENCOUNTER — Telehealth: Payer: Self-pay | Admitting: Cardiovascular Disease

## 2021-03-23 ENCOUNTER — Other Ambulatory Visit: Payer: Self-pay

## 2021-03-23 DIAGNOSIS — K2100 Gastro-esophageal reflux disease with esophagitis, without bleeding: Secondary | ICD-10-CM

## 2021-03-23 MED ORDER — FAMOTIDINE 20 MG PO TABS: 20.0000 mg | ORAL_TABLET | Freq: Two times a day (BID) | ORAL | 1 refills | Status: DC

## 2021-03-23 NOTE — Telephone Encounter (Signed)
Please call [facility] at  [phone] and ask for [facility]. CPAP is helping patient sleep better. Patient want to keep CPAP

## 2021-03-24 ENCOUNTER — Telehealth: Payer: Medicare Other

## 2021-03-24 ENCOUNTER — Ambulatory Visit: Payer: Medicare Other | Admitting: Neurology

## 2021-03-25 ENCOUNTER — Ambulatory Visit: Payer: Medicare Other | Admitting: *Deleted

## 2021-03-25 DIAGNOSIS — K2100 Gastro-esophageal reflux disease with esophagitis, without bleeding: Secondary | ICD-10-CM

## 2021-03-25 DIAGNOSIS — R7303 Prediabetes: Secondary | ICD-10-CM

## 2021-03-25 DIAGNOSIS — F0670 Mild neurocognitive disorder due to known physiological condition without behavioral disturbance: Secondary | ICD-10-CM

## 2021-03-25 DIAGNOSIS — F418 Other specified anxiety disorders: Secondary | ICD-10-CM

## 2021-03-25 DIAGNOSIS — I10 Essential (primary) hypertension: Secondary | ICD-10-CM

## 2021-03-25 DIAGNOSIS — M810 Age-related osteoporosis without current pathological fracture: Secondary | ICD-10-CM

## 2021-03-25 DIAGNOSIS — F339 Major depressive disorder, recurrent, unspecified: Secondary | ICD-10-CM

## 2021-03-25 DIAGNOSIS — D444 Neoplasm of uncertain behavior of craniopharyngeal duct: Secondary | ICD-10-CM

## 2021-03-25 DIAGNOSIS — R296 Repeated falls: Secondary | ICD-10-CM

## 2021-03-25 DIAGNOSIS — E669 Obesity, unspecified: Secondary | ICD-10-CM

## 2021-03-25 NOTE — Patient Instructions (Signed)
Visit Information  Thank you for taking time to visit with me today. Please don't hesitate to contact me if I can be of assistance to you before our next scheduled telephone appointment.  Following are the goals we discussed today:  Patient Goals/Self-Care Activities: Continue to receive personal counseling with LCSW, on a bi-weekly basis, to reduce and manage symptoms of Anxiety and Depression, until re-established with Clint Bolder, LCSW with [place].   Keep scheduled appointments with Clint Bolder, LCSW with [place] [phone]), in an effort to receive ongoing mental health counseling and supportive services, as well as reduce and manage symptoms of Anxiety and Depression.   Thorough review and extensive discussion regarding "Sharing Your Diagnosis", educational material obtained from Alzheimer's Association.   Remember to use the following key points when disclosing your diagnosis of Alzheimer's/Dementia to family members, friends, loved ones, neighbors, and congregation members: ~ Why Tell? ~ Deciding Who to Tell? ~ How to Share Your Diagnosis? ~ Tips from Early-Stage Individuals. ~ Responses to Diagnosis.   Practice what you might say when disclosing your diagnosis of Alzheimer's/Dementia, as sharing your diagnosis with others is an important step toward integrating Alzheimer's disease into your life Contact LCSW directly (# [phone]), if you have questions, need assistance, or if additional social work needs are identified between now and our next scheduled telephone outreach call.  Follow-Up:  04/11/2021 at 10:30 am  Please call the care guide team at [phone] if you need to cancel or reschedule your appointment.   If you are experiencing a Mental Health or [place] or need someone to talk to, please call the Suicide and Crisis Lifeline: 988 call the [place] National Suicide Prevention Lifeline: 251 333 0980 or TTY:  856-598-1719 TTY 862-741-8856) to talk to a trained counselor call 1-800-273-TALK (toll free, 24 hour hotline) go to [facility] [address], [place] [phone]) call the [place]: [phone] call 911   Patient verbalizes understanding of instructions and care plan provided today and agrees to view in [place]. Active MyChart status confirmed with patient.    [place] Licensed Clinical Social Worker [facility] [phone]

## 2021-03-25 NOTE — Chronic Care Management (AMB) (Signed)
Chronic Care Management    Clinical Social Work Note  03/25/2021 Name: Shannon [name] MRN: [id number] DOB: 08/16/1945  Shannon [name] is a 76 y.o. year old female who is a primary care patient of Mosie Lukes, MD. The CCM team was consulted to assist the patient with chronic disease management and/or care coordination needs related to: [employer], Mental Health Counseling and Resources, and Grief Counseling.   Engaged with patient by telephone for follow up visit in response to provider referral for social work chronic care management and care coordination services.   Consent to Services:  The patient was given information about Chronic Care Management services, agreed to services, and gave verbal consent prior to initiation of services.  Please see initial visit note for detailed documentation.   Patient agreed to services and consent obtained.   Assessment: Review of patient past medical history, allergies, medications, and health status, including review of relevant consultants reports was performed today as part of a comprehensive evaluation and provision of chronic care management and care coordination services.     SDOH (Social Determinants of Health) assessments and interventions performed:    Advanced Directives Status: Not addressed in this encounter.  CCM Care Plan  Allergies  Allergen Reactions   Dilaudid [Hydromorphone Hcl] Itching    Outpatient Encounter Medications as of 03/25/2021  Medication Sig   acetaminophen (TYLENOL) 500 MG tablet Take 1-2 tablets (500-1,000 mg total) by mouth every 8 (eight) hours as needed for moderate pain.   albuterol (VENTOLIN HFA) 108 (90 Base) MCG/ACT inhaler INHALE 1-2 PUFFS INTO THE LUNGS EVERY 6 (SIX) HOURS AS NEEDED FOR WHEEZING OR SHORTNESS OF BREATH.   Calcium Carbonate (CALCIUM 600 PO) Take 1 tablet by mouth in the morning and at bedtime.   carvedilol (COREG) 25 MG tablet TAKE 1 TABLET TWICE DAILY WITH A MEAL   cetirizine  (ZYRTEC) 10 MG tablet Take 10 mg by mouth daily.   diltiazem (CARDIZEM CD) 120 MG 24 hr capsule Take 1 capsule (120 mg total) by mouth daily.   docusate sodium (COLACE) 250 MG capsule Take 250 mg by mouth daily as needed for constipation.   donepezil (ARICEPT) 10 MG tablet TAKE 1 TABLET AT BEDTIME   ergocalciferol (VITAMIN D2) 1.25 MG (50000 UT) capsule Take 2,000 Units by mouth daily.   famotidine (PEPCID) 20 MG tablet Take 1 tablet (20 mg total) by mouth 2 (two) times daily.   ferrous sulfate 325 (65 FE) MG tablet Take 325 mg by mouth daily with breakfast.   folic acid (FOLVITE) 1 MG tablet Take 1 tablet (1 mg total) by mouth daily.   gabapentin (NEURONTIN) 300 MG capsule Take 1 capsule (300 mg total) by mouth 2 (two) times daily.   hydrOXYzine (ATARAX/VISTARIL) 25 MG tablet Take 0.5-1 tablets (12.5-25 mg total) by mouth 2 (two) times daily as needed.   Melatonin 10 MG CAPS Take 1 capsule by mouth at bedtime.   mirabegron ER (MYRBETRIQ) 25 MG TB24 tablet Take 1 tablet by mouth daily.   Multiple Vitamin (MULTIVITAMIN WITH MINERALS) TABS tablet Take 1 tablet by mouth daily.   omeprazole (PRILOSEC) 20 MG capsule Take 1 capsule (20 mg total) by mouth daily.   pentosan polysulfate (ELMIRON) 100 MG capsule Take 100 mg by mouth as directed. two at morning and two at night   rosuvastatin (CRESTOR) 20 MG tablet TAKE 1 TABLET AT BEDTIME   telmisartan (MICARDIS) 40 MG tablet Take 1 tablet (40 mg total) by mouth daily.  venlafaxine XR (EFFEXOR-XR) 75 MG 24 hr capsule Take 3 capsules (225 mg total) by mouth daily with breakfast.   No facility-administered encounter medications on file as of 03/25/2021.    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   Right knee pain 07/10/2020   Urinary frequency 02/11/2020   Debility 12/23/2017   Left knee pain 12/23/2017   Vitamin D deficiency 06/14/2017   Vertebral fracture, osteoporotic 04/05/2016   Left-sided back pain 03/06/2016   Chest pain 02/27/2016   Syncope 02/27/2016   Cataracts, bilateral 12/28/2015   Recurrent falls 12/28/2015   Family history- stomach cancer 09/08/2015   Family history of cancer 09/08/2015   [name] lesion 06/21/2015   Obesity 09/28/2014   Interstitial cystitis 09/28/2014   Pain in joint, ankle and foot 09/28/2014   Abnormal nuclear stress test 12/16/2013   Sternal pain 11/06/2013   Postoperative anemia due to acute blood loss 02/13/2013   Congestive dilated cardiomyopathy ([place]) 12/30/2012   Breast cancer of lower-inner quadrant of right female breast 10/21/2012   Constipation 03/03/2012   OA (osteoarthritis) 09/07/2011   Dermatitis    Hyponatremia 08/28/2011   Osteoporosis 03/07/2011   Prediabetes 06/15/2009   Lipoma 02/10/2009   Dyspnea on exertion 02/10/2009   Hyperlipidemia, mixed 08/11/2008   Major depressive disorder with anxious distress 08/11/2008   Essential hypertension 08/11/2008   GERD (gastroesophageal reflux disease) 08/11/2008   Craniopharyngioma 2000    Conditions to be addressed/monitored: Anxiety, Depression, and Dementia.  Mental Health Concerns, Social Isolation, Cognitive Deficits, Memory Deficits, and Lacks Knowledge of [employer].  Care Plan : LCSW Plan of Care  Updates made by Francis Gaines, LCSW since 03/25/2021 12:00 AM     Problem: Reduce and Manage My Symptoms of Anxiety and Depression.   Priority: High     Goal: Reduce and Manage My Symptoms of Anxiety and Depression.   Start Date: 12/30/2020  Expected End Date: 05/30/2021  This Visit's Progress: On track  Recent Progress: On track  Priority: High  Note:   Current Barriers:   Acute Mental Health needs related to Debility,  Obesity, Major Depressive Disorder with Anxious Distress, and Mild Neurocognitive Disorder due to Alzheimer's Disease, requires Support, Education, Resources, Referrals, Advocacy, and Care Coordination, in order to meet unmet mental health needs. Clinical Goal(s):  Patient will work with LCSW to reduce and manage symptoms of Anxiety and Depression, until re-established with a community mental health provider.     Patient will increase knowledge and/or ability of:        Coping Skills, Healthy Habits, Self-Management Skills, Stress Reduction, Home Safety and Utilizing [employer] and Resources.   Interventions: Collaboration with Primary Care Physician, Dr. Penni Homans regarding development and update of comprehensive plan of care as evidenced by provider attestation and co-signature. Inter-disciplinary care team collaboration (see longitudinal plan of care). Clinical Interventions:  Solution-Focused Therapy Performed. Emotional Support Provided and Verbalization of Feelings Encouraged. Problem Solving Solutions Developed. Client-Centered Therapy Initiated. Psychotropic Medication Regimen Reviewed and Compliance Discussed.      Patient Goals/Self-Care Activities: Continue to receive personal counseling with LCSW, on a bi-weekly basis, to reduce and manage symptoms of Anxiety and Depression, until re-established with Clint Bolder, LCSW with [facility].   Keep scheduled appointments with Clint Bolder, LCSW with [facility] [phone]), in an effort to receive ongoing mental health counseling and supportive services, as well as reduce and manage symptoms of Anxiety and Depression.   Thorough review and extensive discussion regarding "Sharing Your Diagnosis", educational material obtained from Alzheimer's Association.   Remember to use the following key points when disclosing your diagnosis of Alzheimer's/Dementia to family members, friends, loved ones,  neighbors, and congregation members: ~ Why Tell? ~ Deciding Who to Tell? ~ How to Share Your Diagnosis? ~ Tips from Early-Stage Individuals. ~ Responses to Diagnosis.   Practice what you might say when disclosing your diagnosis of Alzheimer's/Dementia, as sharing your diagnosis with others is an important step toward integrating Alzheimer's disease into your life Contact LCSW directly (# [phone]), if you have questions, need assistance, or if additional social work needs are identified between now and our next scheduled telephone outreach call.  Follow-Up:  04/11/2021 at 10:30 am      [place] Social Worker [place] [phone]

## 2021-03-26 ENCOUNTER — Other Ambulatory Visit: Payer: Self-pay | Admitting: Family Medicine

## 2021-03-29 DIAGNOSIS — F32A Depression, unspecified: Secondary | ICD-10-CM | POA: Diagnosis not present

## 2021-03-29 DIAGNOSIS — E785 Hyperlipidemia, unspecified: Secondary | ICD-10-CM | POA: Diagnosis not present

## 2021-03-29 DIAGNOSIS — F0390 Unspecified dementia without behavioral disturbance: Secondary | ICD-10-CM

## 2021-03-29 DIAGNOSIS — I10 Essential (primary) hypertension: Secondary | ICD-10-CM

## 2021-04-11 ENCOUNTER — Ambulatory Visit (INDEPENDENT_AMBULATORY_CARE_PROVIDER_SITE_OTHER): Payer: Medicare Other | Admitting: *Deleted

## 2021-04-11 DIAGNOSIS — T148XXA Other injury of unspecified body region, initial encounter: Secondary | ICD-10-CM

## 2021-04-11 DIAGNOSIS — E782 Mixed hyperlipidemia: Secondary | ICD-10-CM

## 2021-04-11 DIAGNOSIS — F418 Other specified anxiety disorders: Secondary | ICD-10-CM

## 2021-04-11 DIAGNOSIS — I10 Essential (primary) hypertension: Secondary | ICD-10-CM

## 2021-04-11 DIAGNOSIS — F0670 Mild neurocognitive disorder due to known physiological condition without behavioral disturbance: Secondary | ICD-10-CM

## 2021-04-11 DIAGNOSIS — D444 Neoplasm of uncertain behavior of craniopharyngeal duct: Secondary | ICD-10-CM

## 2021-04-11 DIAGNOSIS — K2100 Gastro-esophageal reflux disease with esophagitis, without bleeding: Secondary | ICD-10-CM

## 2021-04-11 DIAGNOSIS — H02413 Mechanical ptosis of bilateral eyelids: Secondary | ICD-10-CM | POA: Diagnosis not present

## 2021-04-11 DIAGNOSIS — M810 Age-related osteoporosis without current pathological fracture: Secondary | ICD-10-CM

## 2021-04-11 DIAGNOSIS — F339 Major depressive disorder, recurrent, unspecified: Secondary | ICD-10-CM

## 2021-04-11 DIAGNOSIS — G309 Alzheimer's disease, unspecified: Secondary | ICD-10-CM

## 2021-04-11 NOTE — Chronic Care Management (AMB) (Signed)
Chronic Care Management    Clinical Social Work Note  04/11/2021 Name: Shannon [name] MRN: [id number] DOB: May 01, 1945  Shannon [name] is a 76 y.o. year old female who is a primary care patient of Mosie Lukes, MD. The CCM team was consulted to assist the patient with chronic disease management and/or care coordination needs related to: Appointment Scheduling Needs, [employer], and Mental Health Counseling and Resources.   Engaged with patient by telephone for follow up visit in response to provider referral for social work chronic care management and care coordination services.   Consent to Services:  The patient was given information about Chronic Care Management services, agreed to services, and gave verbal consent prior to initiation of services.  Please see initial visit note for detailed documentation.   Patient agreed to services and consent obtained.   Assessment: Review of patient past medical history, allergies, medications, and health status, including review of relevant consultants reports was performed today as part of a comprehensive evaluation and provision of chronic care management and care coordination services.     SDOH (Social Determinants of Health) assessments and interventions performed:    Advanced Directives Status: Not addressed in this encounter.  CCM Care Plan  Allergies  Allergen Reactions   Dilaudid [Hydromorphone Hcl] Itching    Outpatient Encounter Medications as of 04/11/2021  Medication Sig   acetaminophen (TYLENOL) 500 MG tablet Take 1-2 tablets (500-1,000 mg total) by mouth every 8 (eight) hours as needed for moderate pain.   albuterol (VENTOLIN HFA) 108 (90 Base) MCG/ACT inhaler INHALE 1-2 PUFFS INTO THE LUNGS EVERY 6 (SIX) HOURS AS NEEDED FOR WHEEZING OR SHORTNESS OF BREATH.   Calcium Carbonate (CALCIUM 600 PO) Take 1 tablet by mouth in the morning and at bedtime.   carvedilol (COREG) 25 MG tablet TAKE 1 TABLET TWICE DAILY WITH A MEAL    cetirizine (ZYRTEC) 10 MG tablet Take 10 mg by mouth daily.   diltiazem (CARDIZEM CD) 120 MG 24 hr capsule Take 1 capsule (120 mg total) by mouth daily.   docusate sodium (COLACE) 250 MG capsule Take 250 mg by mouth daily as needed for constipation.   donepezil (ARICEPT) 10 MG tablet TAKE 1 TABLET AT BEDTIME   ergocalciferol (VITAMIN D2) 1.25 MG (50000 UT) capsule Take 2,000 Units by mouth daily.   famotidine (PEPCID) 20 MG tablet Take 1 tablet (20 mg total) by mouth 2 (two) times daily.   ferrous sulfate 325 (65 FE) MG tablet Take 325 mg by mouth daily with breakfast.   folic acid (FOLVITE) 1 MG tablet Take 1 tablet (1 mg total) by mouth daily.   gabapentin (NEURONTIN) 300 MG capsule Take 1 capsule (300 mg total) by mouth 2 (two) times daily.   hydrOXYzine (ATARAX/VISTARIL) 25 MG tablet Take 0.5-1 tablets (12.5-25 mg total) by mouth 2 (two) times daily as needed.   Melatonin 10 MG CAPS Take 1 capsule by mouth at bedtime.   mirabegron ER (MYRBETRIQ) 25 MG TB24 tablet Take 1 tablet by mouth daily.   Multiple Vitamin (MULTIVITAMIN WITH MINERALS) TABS tablet Take 1 tablet by mouth daily.   omeprazole (PRILOSEC) 20 MG capsule TAKE 1 CAPSULE BY MOUTH EVERY DAY   pentosan polysulfate (ELMIRON) 100 MG capsule Take 100 mg by mouth as directed. two at morning and two at night   rosuvastatin (CRESTOR) 20 MG tablet TAKE 1 TABLET AT BEDTIME   telmisartan (MICARDIS) 40 MG tablet Take 1 tablet (40 mg total) by mouth daily.  venlafaxine XR (EFFEXOR-XR) 75 MG 24 hr capsule Take 3 capsules (225 mg total) by mouth daily with breakfast.   No facility-administered encounter medications on file as of 04/11/2021.    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   Right knee pain 07/10/2020   Urinary frequency 02/11/2020   Debility 12/23/2017   Left knee pain 12/23/2017   Vitamin D deficiency 06/14/2017   Vertebral fracture, osteoporotic 04/05/2016   Left-sided back pain 03/06/2016   Chest pain 02/27/2016   Syncope 02/27/2016   Cataracts, bilateral 12/28/2015   Recurrent falls 12/28/2015   Family history- stomach cancer 09/08/2015   Family history of cancer 09/08/2015   [name] lesion 06/21/2015   Obesity 09/28/2014   Interstitial cystitis 09/28/2014   Pain in joint, ankle and foot 09/28/2014   Abnormal nuclear stress test 12/16/2013   Sternal pain 11/06/2013   Postoperative anemia due to acute blood loss 02/13/2013   Congestive dilated cardiomyopathy ([place]) 12/30/2012   Breast cancer of lower-inner quadrant of right female breast 10/21/2012   Constipation 03/03/2012   OA (osteoarthritis) 09/07/2011   Dermatitis    Hyponatremia 08/28/2011   Osteoporosis 03/07/2011   Prediabetes 06/15/2009   Lipoma 02/10/2009   Dyspnea on exertion 02/10/2009   Hyperlipidemia, mixed 08/11/2008   Major depressive disorder with anxious distress 08/11/2008   Essential hypertension 08/11/2008   GERD (gastroesophageal reflux disease) 08/11/2008   Craniopharyngioma 2000    Conditions to be addressed/monitored:  Obesity, Major Depressive Disorder with Anxious Distress, and Mild Neurocognitive Disorder due to Alzheimer's Disease.  Limited Social Support, Mental Health Concerns, Limited Access to Caregiver, Cognitive Deficits, Memory Deficits, and Lacks Knowledge of [employer].  Care Plan : LCSW Plan of Care  Updates made by Francis Gaines, LCSW since 04/11/2021 12:00 AM     Problem: Reduce and Manage My Symptoms of Anxiety and Depression.   Priority: High     Goal: Reduce and Manage My Symptoms of Anxiety and Depression.   Start Date: 12/30/2020  Expected End Date: 05/30/2021  This Visit's Progress: On track   Recent Progress: On track  Priority: High  Note:   Current Barriers:   Acute Mental Health needs related to Debility, Obesity, Major Depressive Disorder with Anxious Distress, and Mild Neurocognitive Disorder due to Alzheimer's Disease, requires Support, Education, Resources, Referrals, Advocacy, and Care Coordination, in order to meet unmet mental health needs. Clinical Goal(s):  Patient will work with LCSW to reduce and manage symptoms of Anxiety and Depression, until re-established with a community mental health provider.     Patient will increase knowledge and/or ability of:        Coping Skills, Healthy Habits, Self-Management Skills, Stress Reduction, Home Safety and Utilizing [employer] and Resources.   Interventions: Collaboration with Primary Care Physician, Dr. Penni Homans regarding development and update of comprehensive plan of care, as evidenced by provider attestation and co-signature. Inter-disciplinary care team collaboration (see longitudinal plan of care). Clinical Interventions:  Solution-Focused Therapy Performed. Emotional Support Provided and Verbalization of Feelings Encouraged. Problem Solving Solutions Developed. Client-Centered Therapy Initiated. Cognitive Behavioral Therapy Revisited.     Patient Goals/Self-Care Activities: Continue to receive personal counseling with LCSW, on a bi-weekly basis, to reduce and manage symptoms  of Anxiety and Depression, until well re-established with Clint Bolder, LCSW with [facility].   Keep scheduled appointments with Clint Bolder, LCSW with [facility] [place] [phone]), in an effort to receive ongoing mental health counseling and supportive services, as well as reduce and manage symptoms of Anxiety and Depression.   ~ Next appointment with Clint Bolder, LCSW with [facility], scheduled on 04/21/2021 at 10:00 am. Begin reading, "Youre Not Alone: Living With Dementia",  written by Brynda Rim, Elder Law Attorney and [place] of [facility] in [place], [place]. Plan to attend the following lecture series, offered by Brynda Rim, Elder Law [place]: ~ 04/13/2021 at 10:30am: When the Diagnosis is Dementia: 3 Action Steps You Should Take Right Now. ~ 05/17/2021 at 10:30am: Elder Care and Asset Protection Workshop: What Happens If One of [place] Gets Sick?  Begin to attend the following seminars, offered by [place]:  ~ 04/28/2021 at 1:00pm:  10 Warning Signs of Alzheimers; [place] [address], [place], [place]. ~ 05/19/2021 at 6:00pm:  Powerful Tools for Caregivers; [facility], [address], [place], [place]. Collaboration with Primary Care Physician, Dr. Penni Homans to confirm ability to continue to operate a motor vehicle, with no restrictions. Contact LCSW directly (# [phone]), if you have questions, need assistance, or if additional social work needs are identified between now and our next scheduled telephone outreach call.  Follow-Up:  04/25/2021 at 12:15 pm  Nat Christen [place] Clinical Social Worker [place] [phone]

## 2021-04-11 NOTE — Patient Instructions (Addendum)
Visit Information ? ?Thank you for taking time to visit with me today. Please don't hesitate to contact me if I can be of assistance to you before our next scheduled telephone appointment. ? ?Following are the goals we discussed today:  ?Patient Goals/Self-Care Activities: ?Continue to receive personal counseling with LCSW, on a bi-weekly basis, to reduce and manage symptoms of Anxiety and Depression, until well re-established with Clint Bolder, LCSW with [facility].   ?Keep scheduled appointments with Clint Bolder, LCSW with [facility] [phone]), in an effort to receive ongoing mental health counseling and supportive services, as well as reduce and manage symptoms of Anxiety and Depression.   ?~ Next appointment with Clint Bolder, LCSW with [facility], scheduled on 04/21/2021 at 10:00 am. ?Begin reading, "You?re Not Alone: Living With Dementia", written by Brynda Rim, Elder Law [address] and [place] of [employer] [facility] in [place], [place]. ?Plan to attend the following lecture series, offered by Brynda Rim, Elder Law Attorney and [place] of [employer]: ?~ 04/13/2021 at 10:30am: When the Diagnosis is Dementia: 3 Action Steps You Should Take Right Now. ?~ 05/17/2021 at 10:30am: Elder Care and Asset Protection Workshop: What Happens If One of [place] Gets Sick?  ?Begin to attend the following seminars, offered by [place]: ? ~ 04/28/2021 at 1:00pm:  10 Warning Signs of Alzheimer?s; [place], [place] [address], [place], [place]. ?~ 05/19/2021 at 6:00pm:  Powerful Tools for Caregivers; [facility], [address], [place], [place]. ?Collaboration with Primary Care Physician, Dr. Penni Homans to confirm ability to continue to operate a motor vehicle, with no restrictions. ?Contact LCSW directly (# [phone]), if you have questions, need assistance, or if additional social work needs  are identified between now and our next scheduled telephone outreach call.  ?Follow-Up:  04/25/2021 at 12:15 pm ? ?Please call the care guide team at [phone] if you need to cancel or reschedule your appointment.  ? ?If you are experiencing a Mental Health or [place] or need someone to talk to, please call the Suicide and Crisis Lifeline: 988 ?call the [place] National Suicide Prevention Lifeline: 929-513-3903 or TTY: (562)365-6110 TTY 410-858-4796) to talk to a trained counselor ?call 1-800-273-TALK (toll free, 24 hour hotline) ?go to [facility] [address] [place] [phone]) ?call the [facility]: [phone] ?call 911  ? ?Patient verbalizes understanding of instructions and care plan provided today and agrees to view in [place]. Active MyChart status confirmed with patient.   ? ?Nat Christen LCSW ?Licensed Clinical Social Worker ?[place] ?[phone]  ?

## 2021-04-12 DIAGNOSIS — D2261 Melanocytic nevi of right upper limb, including shoulder: Secondary | ICD-10-CM | POA: Diagnosis not present

## 2021-04-12 DIAGNOSIS — L565 Disseminated superficial actinic porokeratosis (DSAP): Secondary | ICD-10-CM | POA: Diagnosis not present

## 2021-04-12 DIAGNOSIS — L570 Actinic keratosis: Secondary | ICD-10-CM | POA: Diagnosis not present

## 2021-04-12 DIAGNOSIS — L723 Sebaceous cyst: Secondary | ICD-10-CM | POA: Diagnosis not present

## 2021-04-12 DIAGNOSIS — Z85820 Personal history of malignant melanoma of skin: Secondary | ICD-10-CM | POA: Diagnosis not present

## 2021-04-12 DIAGNOSIS — L821 Other seborrheic keratosis: Secondary | ICD-10-CM | POA: Diagnosis not present

## 2021-04-15 ENCOUNTER — Ambulatory Visit: Payer: Medicare Other

## 2021-04-15 ENCOUNTER — Other Ambulatory Visit: Payer: Self-pay | Admitting: Family Medicine

## 2021-04-15 DIAGNOSIS — E782 Mixed hyperlipidemia: Secondary | ICD-10-CM

## 2021-04-15 DIAGNOSIS — N3010 Interstitial cystitis (chronic) without hematuria: Secondary | ICD-10-CM

## 2021-04-15 DIAGNOSIS — I10 Essential (primary) hypertension: Secondary | ICD-10-CM

## 2021-04-15 NOTE — Patient Instructions (Signed)
Visit Information ? ?Thank you for taking time to visit with me today. Please don't hesitate to contact me if I can be of assistance to you before our next scheduled telephone appointment. ? ?Following are the goals we discussed today:  ?Patient Goals/Self-Care Activities: ?Contact your provider if asthma/allergy symptoms worsen or does not improve ?Take medications as prescribed   ?Attend all scheduled provider appointments ?Call provider office for new concerns or questions  ?Continue to Eat Healthy: fruits, vegetables, lean meat, healthy fats; avoid trans fats and simple carbohydrate and processed foods, consider a krill, fish oil or flaxseed capsule daily. Discussed ground flax seed can added to foods such as greek yogurt and oat meal.   ?Do some type of activity you enjoy every day ?Continue to work with Care Management Team, RN Case manager and/or Licensed clinical social worker ; Nurse, adult for care management and care coordination needs ?Review education on Asthma and plan to discuss at next telephone call ? ?Our next appointment is by telephone on 05/19/21 at 2:00 pm ? ?Please call the care guide team at [phone] if you need to cancel or reschedule your appointment.  ? ?If you are experiencing a Mental Health or [place] or need someone to talk to, please call the Suicide and Crisis Lifeline: 988 ?call 1-800-273-TALK (toll free, 24 hour hotline)  ? ?Patient verbalizes understanding of instructions and care plan provided today and agrees to view in [place]. Active MyChart status confirmed with patient.   ? ?Thea Silversmith, RN, MSN, BSN, CCM ?Care Management Coordinator ?[facility] ?[phone] ? ? ? ?Asthma Action Plan, Adult ?An asthma action plan helps you understand how to manage your asthma and what to do when you have an asthma attack. The action plan is a color-coded plan that lists the symptoms that indicate whether or not your condition is under control and  what actions to take. ?If you have symptoms in the green zone, you are doing well. ?If you have symptoms in the yellow zone, you are having problems. ?If you have symptoms in the red zone, you need medical care right away. ?Follow the plan that you and your health care provider develop. Review your plan with your health care provider at each visit. ?What triggers your asthma? ?Knowing the things that can trigger an asthma attack or make your asthma symptoms worse is very important. Talk to your health care provider about your asthma triggers and how to avoid them. Record your known asthma triggers here: _______________ ?What is your personal best peak flow reading? ?If you use a peak flow meter, determine your personal best reading. Record it here: _______________ ?Green zone ?This zone means that your asthma is under control. You may not have any symptoms while you are in the green zone. This means that you: ?Have no coughing or wheezing, even while you are working or playing. ?Sleep through the night. ?Are breathing well. ?Have a peak flow reading that is above __________ (80% of your personal best or greater). ?If you are in the green zone, continue to manage your asthma as directed. ?Take these medicines every day: ?Controller medicine and dosage: _______________ ?Controller medicine and dosage: _______________ ?Controller medicine and dosage: _______________ ?Controller medicine and dosage: _______________ ?Before exercise, use this reliever or rescue medicine: _______________ ?Call your health care provider if you are using a reliever or rescue medicine more than 2-3 times a week. ?Yellow zone ?Symptoms in this zone mean that your condition may be getting  worse. You may have symptoms that interfere with exercise, are noticeably worse after exposure to triggers, or are worse at the first sign of a cold (upper respiratory infection). These may include: ?Waking from sleep. ?Coughing, especially at night or first  thing in the morning. ?Mild wheezing. ?Chest tightness. ?A peak flow reading that is __________ to __________ (50-79% of your personal best). ?If you have any of these symptoms: ?Add the following medicine to the ones that you use daily: ?Reliever or rescue medicine and dosage: _______________ ?Additional medicine and dosage: _______________ ?Call your health care provider if: ?You remain in the yellow zone for __________ hours. ?You are using a reliever or rescue medicine more than 2-3 times a week. ?Red zone ?Symptoms in this zone mean that you should get medical help right away. You will likely feel distressed and have symptoms at rest that restrict your activity. You are in the red zone if: ?You are breathing hard and quickly. ?Your nose opens wide, your ribs show, and your neck muscles become visible when you breathe in. ?Your lips, fingers, or toes are a bluish color. ?You have trouble speaking in full sentences. ?Your peak flow reading is less than __________ (less than 50% of your personal best). ?Your symptoms do not improve within 15-20 minutes after you use your reliever or rescue medicine (bronchodilator). ?If you have any of these symptoms: ?These symptoms represent a serious problem that is an emergency. Do not wait to see if the symptoms will go away. Get medical help right away. Call your local emergency services (911 in the [place] Do not drive yourself to the hospital. ?Use your reliever or rescue medicine. ?Start a nebulizer treatment or take 2-4 puffs from a metered-dose inhaler with a spacer. ?Repeat this action every 15-20 minutes until help arrives. ?Where to find more information ?You can find more information about asthma from: ?Centers for Disease Control and Prevention: http://www.wolf.info/ ?American Lung Association: www.lung.org ?This information is not intended to replace advice given to you by your health care provider. Make sure you discuss any questions you have with your health care  provider. ?Document Revised: 03/11/2020 Document Reviewed: 03/11/2020 ?Elsevier Patient Education ? [place]. ? ?

## 2021-04-15 NOTE — Chronic Care Management (AMB) (Signed)
?Chronic Care Management  ? ?CCM RN Visit Note ? ?04/15/2021 ?Name: [name] MRN: [id number] DOB: 06-12-1945 ? ?Subjective: ?Shannon [name] is a 76 y.o. year old female who is a primary care patient of Mosie Lukes, MD. The care management team was consulted for assistance with disease management and care coordination needs.   ? ?Engaged with patient by telephone for follow up visit in response to provider referral for case management and/or care coordination services.  ? ?Consent to Services:  ?The patient was given information about Chronic Care Management services, agreed to services, and gave verbal consent prior to initiation of services.  Please see initial visit note for detailed documentation.  ? ?Patient agreed to services and verbal consent obtained.  ? ?Assessment: Review of patient past medical history, allergies, medications, health status, including review of consultants reports, laboratory and other test data, was performed as part of comprehensive evaluation and provision of chronic care management services.  ? ?SDOH (Social Determinants of Health) assessments and interventions performed:   ? ?CCM Care Plan ? ?Allergies  ?Allergen Reactions  ? Dilaudid [Hydromorphone Hcl] Itching  ? ? ?Outpatient Encounter Medications as of 04/15/2021  ?Medication Sig  ? acetaminophen (TYLENOL) 500 MG tablet Take 1-2 tablets (500-1,000 mg total) by mouth every 8 (eight) hours as needed for moderate pain.  ? albuterol (VENTOLIN HFA) 108 (90 Base) MCG/ACT inhaler INHALE 1-2 PUFFS INTO THE LUNGS EVERY 6 (SIX) HOURS AS NEEDED FOR WHEEZING OR SHORTNESS OF BREATH.  ? Calcium Carbonate (CALCIUM 600 PO) Take 1 tablet by mouth in the morning and at bedtime.  ? carvedilol (COREG) 25 MG tablet TAKE 1 TABLET TWICE DAILY WITH A MEAL  ? cetirizine (ZYRTEC) 10 MG tablet Take 10 mg by mouth daily.  ? diltiazem (CARDIZEM CD) 120 MG 24 hr capsule Take 1 capsule (120 mg total) by mouth daily.  ? docusate sodium (COLACE) 250 MG  capsule Take 250 mg by mouth daily as needed for constipation.  ? donepezil (ARICEPT) 10 MG tablet TAKE 1 TABLET AT BEDTIME  ? ergocalciferol (VITAMIN D2) 1.25 MG (50000 UT) capsule Take 2,000 Units by mouth daily.  ? famotidine (PEPCID) 20 MG tablet Take 1 tablet (20 mg total) by mouth 2 (two) times daily.  ? ferrous sulfate 325 (65 FE) MG tablet Take 325 mg by mouth daily with breakfast.  ? folic acid (FOLVITE) 1 MG tablet Take 1 tablet (1 mg total) by mouth daily.  ? gabapentin (NEURONTIN) 300 MG capsule Take 1 capsule (300 mg total) by mouth 2 (two) times daily.  ? hydrOXYzine (ATARAX) 25 MG tablet TAKE 0.5-1 TABLETS (12.5-25 MG TOTAL) BY MOUTH 2 (TWO) TIMES DAILY AS NEEDED.  ? Melatonin 10 MG CAPS Take 1 capsule by mouth at bedtime.  ? mirabegron ER (MYRBETRIQ) 25 MG TB24 tablet Take 1 tablet by mouth daily.  ? Multiple Vitamin (MULTIVITAMIN WITH MINERALS) TABS tablet Take 1 tablet by mouth daily.  ? omeprazole (PRILOSEC) 20 MG capsule TAKE 1 CAPSULE BY MOUTH EVERY DAY  ? pentosan polysulfate (ELMIRON) 100 MG capsule Take 100 mg by mouth as directed. two at morning and two at night  ? rosuvastatin (CRESTOR) 20 MG tablet TAKE 1 TABLET AT BEDTIME  ? telmisartan (MICARDIS) 40 MG tablet Take 1 tablet (40 mg total) by mouth daily.  ? venlafaxine XR (EFFEXOR-XR) 75 MG 24 hr capsule Take 3 capsules (225 mg total) by mouth daily with breakfast.  ? ?No facility-administered encounter medications on file as of  04/15/2021.  ? ? ?Patient Active Problem List  ? Diagnosis Date Noted  ? Dementia ([place]) 03/02/2021  ? Sleep apnea 01/12/2021  ? Depression with anxiety 01/12/2021  ? 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 History of melanoma 11/17/2020  ? Palpitation 10/28/2020  ? 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 Right knee pain 07/10/2020  ? Urinary frequency 02/11/2020  ? Debility 12/23/2017  ? Left knee pain 12/23/2017  ? Vitamin D deficiency 06/14/2017  ? Vertebral fracture,  osteoporotic 04/05/2016  ? Left-sided back pain 03/06/2016  ? Chest pain 02/27/2016  ? Syncope 02/27/2016  ? Cataracts, bilateral 12/28/2015  ? Recurrent falls 12/28/2015  ? Family history- stomach cancer 09/08/2015  ? Family history of cancer 09/08/2015  ? [name] lesion 06/21/2015  ? Obesity 09/28/2014  ? Interstitial cystitis 09/28/2014  ? Pain in joint, ankle and foot 09/28/2014  ? Abnormal nuclear stress test 12/16/2013  ? Sternal pain 11/06/2013  ? Postoperative anemia due to acute blood loss 02/13/2013  ? 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 Breast cancer of lower-inner quadrant of right female breast 10/21/2012  ? Constipation 03/03/2012  ? OA (osteoarthritis) 09/07/2011  ? Dermatitis   ? Hyponatremia 08/28/2011  ? Osteoporosis 03/07/2011  ? Prediabetes 06/15/2009  ? Lipoma 02/10/2009  ? Dyspnea on exertion 02/10/2009  ? Hyperlipidemia, mixed 08/11/2008  ? Major depressive disorder with anxious distress 08/11/2008  ? Essential hypertension 08/11/2008  ? GERD (gastroesophageal reflux disease) 08/11/2008  ? Craniopharyngioma 2000  ? ? ?Conditions to be addressed/monitored:HTN, HLD, Anxiety, Depression, Asthma, and Interstitial Cystitis, neurocognitive disorder ? ?Care Plan : RN Case Manager Plan of Care  ?Updates made by Luretha Rued, RN since 04/15/2021 12:00 AM  ?  ? ?Problem: No Plan of Care Established for Managment of Chronic Disease (Neurocognitive Disorder, Anxiety/Depression)   ?Priority: High  ?  ? ?Long-Range Goal: Development of Plan of Care for Chronic Disease Management (Neurocognitive Disorder, Anxiety/Depression)   ?Start Date: 12/13/2020  ?Expected End Date: 09/15/2021  ?Priority: High  ?Note:   ?Current Barriers: Mrs. Blatchford reports she has had to use rescue inhaler recently, adding this is normal for her this time of year due to history of asthma/allergies. She reports she is taking allergy medication. She expresses her main concern today is cost of Elmiron-cost >  1600 for a 3 month refill. She has medication assistance form provided by embedded clinical pharmacist and states she is going to fill out her part and request urologist complete the remaining portion.  Reports she has attended seminar with Brynda Rim for estate planning and has read the Book, "you are not alone"-recommended by Nat Christen, LCSW. ?Knowledge Deficits related to plan of care for management of neurocognitive disorder, anxiety/depression  ?Cognitive Deficits/Memory loss-reports strategies for managing memory loss, keeping notes, writing things down. ?Medication management-improved. Daughter assist with medication management.  ? ?RNCM Clinical Goal(s):  ?Patient will verbalize understanding of plan for management of Anxiety, Depression, and Dementia as evidenced by taking medications as prescribed, following up with provider appointments as schedule, engaging with LCSW and/or referred specialist. ?take all medications exactly as prescribed and will call provider for medication related questions as evidenced by medication adherance    ?work with pharmacist to address medications assistance program if available for Elmiron and follow up on medication managment related to chronic disease as evidenced by review of EMR and patient or pharmacist report    ?work with Education officer, museum to address Mental Health Concerns ,  Cognitive Deficits, and Memory Deficits related to the management of Anxiety and Depression as evidenced by review of EMR and patient or social worker report     through collaboration with Consulting civil engineer, provider, and care team.  ? ?Interventions: ?1:1 collaboration with primary care provider regarding development and update of comprehensive plan of care as evidenced by provider attestation and co-signature ?Inter-disciplinary care team collaboration (see longitudinal plan of care) ?Evaluation of current treatment plan related to  self management and patient's adherence to plan as  established by provider ? ?Interstitial Cystitis  (Status:  Goal on track:  Yes.)  Long Term Goal  ?Patient reports improved since procedure on 02/11/21 ?Evaluation of current treatment plan related to interstitial cystitis an

## 2021-04-21 ENCOUNTER — Ambulatory Visit (INDEPENDENT_AMBULATORY_CARE_PROVIDER_SITE_OTHER): Payer: Medicare Other | Admitting: Psychology

## 2021-04-21 DIAGNOSIS — F4323 Adjustment disorder with mixed anxiety and depressed mood: Secondary | ICD-10-CM | POA: Diagnosis not present

## 2021-04-21 NOTE — Progress Notes (Signed)
[facility] Counselor Initial Adult Exam ? ?Name: Shannon [name] ?Date: 04/21/2021 ?MRN: [id number] ?DOB: November 08, 1945 ?PCP: Mosie Lukes, MD ? ?Time spent: 10:00am-10:55am    55 minutes ? ?Guardian/Payee:  n/a   ? ?Paperwork requested: No  ? ?Reason for Visit /Presenting Problem: Pt and husband Deidre Ala present for initial assessment in person.  Two months ago pt was diagnosed with "altered mental status that will turn into alzheimers".  Pt was evaluated by neurology and diagnosed.   Pt has a lot of feelings including anger and sadness and fear.   Pt has had 26 surgeries.  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  She feels she has already dealt with so much and it is unfair that she has cognitive impairment as well.   Pt has family history of alzheimers and she knows what is ahead for her.   ?Pt's son with downs syndrome passed away two years ago.  He was the joy of pt's life but the caregiving was very stressful.   ?Pt was hoping that now was her time to enjoy life but now she has her diagnosis to deal with.  Pt and husband are trying to [place] their [place] house while they [place].  Pt lives in a 4 level house in [place] and has been there for 30 years.  Pt wants to stay home as long as possible.   ?Pt worries about the progression of the alzheimers.   ? ? ?Mental Status Exam: ?Appearance:   Casual     ?Behavior:  Appropriate  ?Motor:  Normal  ?Speech/Language:   Normal Rate  ?Affect:  Appropriate  ?Mood:  normal  ?Thought process:  normal  ?Thought content:    WNL  ?Sensory/Perceptual disturbances:    WNL  ?Orientation:  oriented to person, place, time/date, and situation  ?Attention:  Good  ?Concentration:  Good  ?Memory:  WNL  ?Fund of knowledge:   Good  ?Insight:    Good  ?Judgment:   Good  ?Impulse Control:  Good  ? ? ? ?Reported Symptoms:  sadness, fear, anger, anxiety ? ?Risk Assessment: ?Danger to Self:  No ?Self-injurious Behavior: No ?Danger to Others: No ?Duty to Warn:no ?Physical  Aggression / Violence:No  ?Access to Firearms a concern: No  ?Gang Involvement:No  ?Patient / guardian was educated about steps to take if suicide or homicide risk level increases between visits: n/a ?While future psychiatric events cannot be accurately predicted, the patient does not currently require acute inpatient psychiatric care and does not currently meet [facility] involuntary commitment criteria. ? ?Substance Abuse History: ?Current substance abuse: No    ? ?Past Psychiatric History:   ?No previous psychological problems have been observed ?Outpatient Providers:n/a ?History of Psych Hospitalization: No  ?Psychological Testing:  n/a   ? ?Abuse History:  ?Victim of: No.,  n/a    ?Report needed: No. ?Victim of Neglect:No. ?Perpetrator of  n/a   ?Witness / Exposure to Domestic Violence: No   ?Protective Services Involvement: No  ?Witness to [employer] Violence:  No  ? ?Family History:  ?Family History  ?Problem Relation Age of Onset  ? Breast cancer Mother   ?     sarcoma  ? Lung cancer Mother   ? Hypertension Mother   ? Prostate cancer Father   ? Congestive Heart Failure Father   ? Heart attack Father   ? Prostate cancer Brother   ? Heart disease Maternal Grandfather   ?     MI  ?  Down syndrome Son   ? CVA Son   ? Stomach cancer Maternal Aunt   ? Dementia Maternal Aunt   ? Uterine cancer Maternal Aunt   ? Colon cancer Neg Hx   ? ? ?Living situation: the patient lives with their spouse ?Pt and husband have been married for 39 years.   ?Pt's husband and 2 adult children are very supportive of her. ? ?Pt grew up with mother and father.  Pt has a younger sister and an older brother.   ?No family history of mental illness, substance abuse or abuse.  ?Pt's father was strict.   Pt's mother was very loving.  ? ?Sexual Orientation: Straight ? ?Relationship Status: married  ?Name of spouse / other:Ralph ?If a parent, number of children / ages:adult son and daughter ? ?Support Systems:  spouse ?friends ?church ? ?Financial Stress:  No  ? ?Income/Employment/Disability: Social Security Retirement ? ?Military Service: No  ? ?Educational History: ?Education: high school diploma/GED ? ?Religion/Sprituality/World View: ?Protestant ? ?Any cultural differences that may affect / interfere with treatment:  not applicable  ? ?Recreation/Hobbies: going [place],  spending time with family. ? ?Stressors: Health problems   ? ?Strengths: Supportive Relationships, Family, Friends, [place], Conservator, museum/gallery, and Able to Communicate Effectively ? ?Barriers:  none  ? ?Legal History: ?Pending legal issue / charges: The patient has no significant history of legal issues. ?History of legal issue / charges:  n/a ? ?Medical History/Surgical History: reviewed ?Past Medical History:  ?Diagnosis Date  ? Abnormal nuclear stress test 12/16/2013  ? Anemia 08/28/2011  ? Asthma   ? as a child  ? Breast cancer of lower-inner quadrant of right female breast 2011  ? Carpal tunnel syndrome of left wrist   ? Cataracts, bilateral 12/28/2015  ? Chest pain 02/27/2016  ? Closed fracture of maxilla 10/17/2017  ? 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 Constipation 03/03/2012  ? 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 Dermatitis   ? Dyspnea on exertion 02/10/2009  ? Essential hypertension 08/11/2008  ? Well controlled, no changes to meds. Encouraged heart healthy diet such as the DASH diet  ? Facial fracture   ? 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 GERD (gastroesophageal reflux disease) 08/11/2008  ? History of blood transfusion   ? History of chicken pox   ? History of hiatal hernia   ? History of measles   ? History of melanoma 2008  ? History of mumps   ? Hyperlipidemia, mixed 08/11/2008  ? Hyponatremia 08/28/2011  ? 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 Interstitial cystitis   ? Left knee pain 12/23/2017  ? Left-sided back pain 03/06/2016  ? Lipoma 02/10/2009  ? Major depressive disorder with anxious  distress 08/11/2008  ? 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 [name] lesion 06/21/2015  ? Neuropathy   ? NICM (nonischemic cardiomyopathy) 03/08/2010  ? likely 2/2 chemotx - a. Echo 2012: EF 45-50%;  b. Lex MV 2/12:  low risk, apical defect (small area of ischemia vs shifting breast atten);  c.  Echo 7/12: Normal wall thickness, EF 60-65%, normal wall motion, grade 1 diastolic dysfunction, mild LAE, PASP 32;   d. Lex MV 11/13:  EF 76%, no ischemia  ? OA (osteoarthritis) 09/07/2011  ? Obesity 09/28/2014  ? Osteoporosis 03/07/2011  ? DEXA T score -2.6 AP spine 03/07/11    Formatting of this note might be different from the original. Formatting of this note might be  different from the original. DEXA T score -2.6 AP spine 03/07/11   Last Assessment & Plan:  Formatting of this note might be different from the original. Encouraged to get adequate exercise, calcium and vitamin d intake  ? Pain in joint, ankle and foot 09/28/2014  ? Palpitation 10/28/2020  ? Personal history of chemotherapy 2012  ? Personal history of radiation therapy 2012  ? Prediabetes 06/15/2009  ? Recurrent falls 12/28/2015  ? Right knee pain 07/10/2020  ? Shortness of breath   ? UTI (urinary tract infection) 03/03/2012  ? Vertebral fracture, osteoporotic, sequela 04/05/2016  ? Vitamin D deficiency 06/14/2017  ? Supplement and monitor  ? ? ?Past Surgical History:  ?Procedure Laterality Date  ? BREAST LUMPECTOMY  04/2009  ? RIGHT FOR BREAST CANCER-CHEMO/RADIATION X 1 YEAR  ? BREAST REDUCTION SURGERY Bilateral 05/04/2014  ? Procedure: MAMMARY REDUCTION  (BREAST);  Surgeon: Cristine Polio, MD;  Location: [facility];  Service: Plastics;  Laterality: Bilateral;  ? CARPAL TUNNEL RELEASE    ? L wrist, ulnar nerve moved  ? CHOLECYSTECTOMY    ? CRANIOTOMY FOR TUMOR  2000  ? ELBOW SURGERY    ? left  ? KNEE ARTHROSCOPY Left 10/12/2014  ? Procedure: LEFT KNEE ARTHROSCOPY ;  Surgeon: Gaynelle Arabian, MD;  Location: [facility];  Service: Orthopedics;  Laterality: Left;  ? KYPHOPLASTY N/A 03/30/2016  ? Procedure: THORACIC 12 KYPHOPLASTY;  Surgeon: Phylliss Bob, MD;  Location: [facility];  Service: Orthopedics;  Laterality: N/A;  THORACIC 12 KYPHOPLASTY  ? LEFT HEART CA

## 2021-04-25 ENCOUNTER — Ambulatory Visit: Payer: Medicare Other | Admitting: *Deleted

## 2021-04-25 DIAGNOSIS — K2100 Gastro-esophageal reflux disease with esophagitis, without bleeding: Secondary | ICD-10-CM

## 2021-04-25 DIAGNOSIS — I10 Essential (primary) hypertension: Secondary | ICD-10-CM

## 2021-04-25 DIAGNOSIS — E782 Mixed hyperlipidemia: Secondary | ICD-10-CM

## 2021-04-25 DIAGNOSIS — M810 Age-related osteoporosis without current pathological fracture: Secondary | ICD-10-CM

## 2021-04-25 DIAGNOSIS — N3010 Interstitial cystitis (chronic) without hematuria: Secondary | ICD-10-CM

## 2021-04-25 DIAGNOSIS — F418 Other specified anxiety disorders: Secondary | ICD-10-CM

## 2021-04-25 DIAGNOSIS — H269 Unspecified cataract: Secondary | ICD-10-CM

## 2021-04-25 DIAGNOSIS — D444 Neoplasm of uncertain behavior of craniopharyngeal duct: Secondary | ICD-10-CM

## 2021-04-25 DIAGNOSIS — F0670 Mild neurocognitive disorder due to known physiological condition without behavioral disturbance: Secondary | ICD-10-CM

## 2021-04-25 DIAGNOSIS — I420 Dilated cardiomyopathy: Secondary | ICD-10-CM

## 2021-04-25 DIAGNOSIS — F339 Major depressive disorder, recurrent, unspecified: Secondary | ICD-10-CM

## 2021-04-25 NOTE — Chronic Care Management (AMB) (Signed)
?Chronic Care Management  ? ? Clinical Social Work Note ? ?04/25/2021 ?Name: Shannon [name] MRN: [id number] DOB: [birth year] ? ?Shannon [name] is a 76 y.o. year old female who is a primary care patient of Mosie Lukes, MD. The CCM team was consulted to assist the patient with chronic disease management and/or care coordination needs related to: [employer] and [place] and Resources. ? ?Engaged with patient by telephone for follow up visit in response to provider referral for social work chronic care management and care coordination services.  ? ?Consent to Services:  ?The patient was given information about Chronic Care Management services, agreed to services, and gave verbal consent prior to initiation of services.  Please see initial visit note for detailed documentation.  ? ?Patient agreed to services and consent obtained.  ? ?Assessment: Review of patient past medical history, allergies, medications, and health status, including review of relevant consultants reports was performed today as part of a comprehensive evaluation and provision of chronic care management and care coordination services.    ? ?SDOH (Social Determinants of Health) assessments and interventions performed:   ? ?Advanced Directives Status: Not addressed in this encounter. ? ?CCM Care Plan ? ?Allergies  ?Allergen Reactions  ? Dilaudid [Hydromorphone Hcl] Itching  ? ? ?Outpatient Encounter Medications as of 04/25/2021  ?Medication Sig  ? acetaminophen (TYLENOL) 500 MG tablet Take 1-2 tablets (500-1,000 mg total) by mouth every 8 (eight) hours as needed for moderate pain.  ? albuterol (VENTOLIN HFA) 108 (90 Base) MCG/ACT inhaler INHALE 1-2 PUFFS INTO THE LUNGS EVERY 6 (SIX) HOURS AS NEEDED FOR WHEEZING OR SHORTNESS OF BREATH.  ? Calcium Carbonate (CALCIUM 600 PO) Take 1 tablet by mouth in the morning and at bedtime.  ? carvedilol (COREG) 25 MG tablet TAKE 1 TABLET TWICE DAILY WITH A MEAL  ? cetirizine (ZYRTEC) 10 MG tablet  Take 10 mg by mouth daily.  ? diltiazem (CARDIZEM CD) 120 MG 24 hr capsule Take 1 capsule (120 mg total) by mouth daily.  ? docusate sodium (COLACE) 250 MG capsule Take 250 mg by mouth daily as needed for constipation.  ? donepezil (ARICEPT) 10 MG tablet TAKE 1 TABLET AT BEDTIME  ? ergocalciferol (VITAMIN D2) 1.25 MG (50000 UT) capsule Take 2,000 Units by mouth daily.  ? famotidine (PEPCID) 20 MG tablet Take 1 tablet (20 mg total) by mouth 2 (two) times daily.  ? ferrous sulfate 325 (65 FE) MG tablet Take 325 mg by mouth daily with breakfast.  ? folic acid (FOLVITE) 1 MG tablet Take 1 tablet (1 mg total) by mouth daily.  ? gabapentin (NEURONTIN) 300 MG capsule Take 1 capsule (300 mg total) by mouth 2 (two) times daily.  ? hydrOXYzine (ATARAX) 25 MG tablet TAKE 0.5-1 TABLETS (12.5-25 MG TOTAL) BY MOUTH 2 (TWO) TIMES DAILY AS NEEDED.  ? Melatonin 10 MG CAPS Take 1 capsule by mouth at bedtime.  ? mirabegron ER (MYRBETRIQ) 25 MG TB24 tablet Take 1 tablet by mouth daily.  ? Multiple Vitamin (MULTIVITAMIN WITH MINERALS) TABS tablet Take 1 tablet by mouth daily.  ? omeprazole (PRILOSEC) 20 MG capsule TAKE 1 CAPSULE BY MOUTH EVERY DAY  ? pentosan polysulfate (ELMIRON) 100 MG capsule Take 100 mg by mouth as directed. two at morning and two at night  ? rosuvastatin (CRESTOR) 20 MG tablet TAKE 1 TABLET AT BEDTIME  ? telmisartan (MICARDIS) 40 MG tablet Take 1 tablet (40 mg total) by mouth daily.  ? venlafaxine XR (EFFEXOR-XR) 75  MG 24 hr capsule Take 3 capsules (225 mg total) by mouth daily with breakfast.  ? ?No facility-administered encounter medications on file as of 04/25/2021.  ? ? ?Patient Active Problem List  ? Diagnosis Date Noted  ? Dementia ([place]) 03/02/2021  ? Sleep apnea 01/12/2021  ? Depression with anxiety 01/12/2021  ? 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 History of melanoma 11/17/2020  ? Palpitation 10/28/2020  ? 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 Right knee pain  07/10/2020  ? Urinary frequency 02/11/2020  ? Debility 12/23/2017  ? Left knee pain 12/23/2017  ? Vitamin D deficiency 06/14/2017  ? Vertebral fracture, osteoporotic 04/05/2016  ? Left-sided back pain 03/06/2016  ? Chest pain 02/27/2016  ? Syncope 02/27/2016  ? Cataracts, bilateral 12/28/2015  ? Recurrent falls 12/28/2015  ? Family history- stomach cancer 09/08/2015  ? Family history of cancer 09/08/2015  ? [name] lesion 06/21/2015  ? Obesity 09/28/2014  ? Interstitial cystitis 09/28/2014  ? Pain in joint, ankle and foot 09/28/2014  ? Abnormal nuclear stress test 12/16/2013  ? Sternal pain 11/06/2013  ? Postoperative anemia due to acute blood loss 02/13/2013  ? Congestive dilated cardiomyopathy ([place]) 12/30/2012  ? Breast cancer of lower-inner quadrant of right female breast 10/21/2012  ? Constipation 03/03/2012  ? OA (osteoarthritis) 09/07/2011  ? Dermatitis   ? Hyponatremia 08/28/2011  ? Osteoporosis 03/07/2011  ? Prediabetes 06/15/2009  ? Lipoma 02/10/2009  ? Dyspnea on exertion 02/10/2009  ? Hyperlipidemia, mixed 08/11/2008  ? Major depressive disorder with anxious distress 08/11/2008  ? Essential hypertension 08/11/2008  ? GERD (gastroesophageal reflux disease) 08/11/2008  ? Craniopharyngioma 2000  ? ? ?Conditions to be addressed/monitored: Debility, Obesity, Major Depressive Disorder with Anxious Distress, and Mild Neurocognitive Disorder due to Alzheimer's Disease.  Mental Health Concerns, Limited Access to Caregiver, Cognitive Deficits, Memory Deficits, and Lacks Knowledge of [employer]. ? ?Care Plan : LCSW Plan of Care  ?Updates made by Francis Gaines, LCSW since 04/25/2021 12:00 AM  ?  ? ?Problem: Reduce and Manage My Symptoms of Anxiety and Depression.   ?Priority: High  ?  ? ?Goal: Reduce and Manage My Symptoms of Anxiety and Depression.   ?Start Date: 12/30/2020  ?Expected End Date: 05/30/2021  ?This Visit's Progress: On track  ?Recent Progress: On track  ?Priority: High  ?Note:    ?Current Barriers:   ?Acute Mental Health needs related to Debility, Obesity, Major Depressive Disorder with Anxious Distress, and Mild Neurocognitive Disorder due to Alzheimer's Disease, requires Support, Education, Resources, Referrals, Advocacy, and Care Coordination, in order to meet unmet mental health needs. ?Clinical Goal(s):  ?Patient will work with LCSW to reduce and manage symptoms of Anxiety and Depression, until re-established with a community mental health provider.     ?Patient will increase knowledge and/or ability of:  ?      Coping Skills, Healthy Habits, Self-Management Skills, Stress Reduction, Home Safety and Utilizing [employer] and Resources.   ?Interventions: ?Collaboration with Primary Care Physician, Dr. Penni Homans regarding development and update of comprehensive plan of care, as evidenced by provider attestation and co-signature. ?Inter-disciplinary care team collaboration (see longitudinal plan of care). ?Clinical Interventions:  ?Solution-Focused Therapy Performed. ?Emotional Support Provided and Verbalization of Feelings Encouraged. ?Problem Solving Solutions Developed. ?Client-Centered Therapy Initiated. ?Cognitive Behavioral Therapy Revisited.     ?Patient Goals/Self-Care Activities: ?Continue to receive personal counseling with LCSW, on a bi-weekly basis, to reduce and manage symptoms of Anxiety and Depression.   ?Keep  scheduled appointments with Clint Bolder, LCSW with [place] [phone]), in an effort to receive ongoing mental health counseling and supportive services, as well as reduce and manage symptoms of Anxiety and Depression.   ?~ Next appointment with Clint Bolder, LCSW with [place], scheduled on 06/23/2021 at 2:00 pm. ?LCSW collaboration with [name], Pharmacist with [facility] at [facility], to request assistance with medication management services. ?Continue self-guided reading of, "You?re Not Alone:  Living With Dementia", written by Brynda Rim, Elder Law [address] and [place] of [employer] in [place], [place]. ?Continue to attend the following lecture series, offered by Brynda Rim, Elder [place]

## 2021-04-25 NOTE — Patient Instructions (Signed)
Visit Information ? ?Thank you for taking time to visit with me today. Please don't hesitate to contact me if I can be of assistance to you before our next scheduled telephone appointment. ? ?Following are the goals we discussed today:  ?Patient Goals/Self-Care Activities: ?Continue to receive personal counseling with LCSW, on a bi-weekly basis, to reduce and manage symptoms of Anxiety and Depression.   ?Keep scheduled appointments with Clint Bolder, LCSW with [place] [phone]), in an effort to receive ongoing mental health counseling and supportive services, as well as reduce and manage symptoms of Anxiety and Depression.   ?~ Next appointment with Clint Bolder, LCSW with [place], scheduled on 06/23/2021 at 2:00 pm. ?LCSW collaboration with [name], Pharmacist with [facility] at [facility], to request assistance with medication management services. ?Continue self-guided reading of, "You?re Not Alone: Living With Dementia", written by Brynda Rim, Elder Law [address] and [place] of [employer] in [place], [place]. ?Continue to attend the following lecture series, offered by Brynda Rim, Elder Law Attorney and [place] of [employer]: ?~ 05/17/2021 at 10:30am: Elder Care and Asset Protection Workshop: What Happens If One of [place] Gets Sick?  ?Continue to attend the following seminars, offered by [place]: ? ~ 04/28/2021 at 1:00pm:  10 Warning Signs of Alzheimer?s; [place], [place] [address] [place], [place]. ?~ 05/19/2021 at 6:00pm:  Powerful Tools for Caregivers; The [facility], [address] [place], [place]. ?LCSW collaboration with Primary Care Physician, Dr. Penni Homans with [facility] at [facility], to confirm ability to continue to operate a motor vehicle, with no restrictions. ?Contact LCSW directly (# [phone]), if you have questions, need assistance,  or if additional social work needs are identified between now and our next scheduled telephone outreach call.  ?Follow-Up:  05/10/2021 at 2:00 pm ? ?Please call the care guide team at [phone] if you need to cancel or reschedule your appointment.  ? ?If you are experiencing a Mental Health or [place] or need someone to talk to, please call the Suicide and Crisis Lifeline: 988 ?call the [place] National Suicide Prevention Lifeline: (916) 458-8391 or TTY: 408 476 4625 TTY 724-584-4788) to talk to a trained counselor ?call 1-800-273-TALK (toll free, 24 hour hotline) ?go to [facility] [address], [place] [phone]) ?call the [employer]: [phone] ?call 911  ? ?Patient verbalizes understanding of instructions and care plan provided today and agrees to view in [place]. Active MyChart status confirmed with patient.   ? ?Nat Christen LCSW ?Licensed Clinical Social Worker ?[place] ?[phone]  ?

## 2021-04-26 ENCOUNTER — Telehealth: Payer: Self-pay | Admitting: *Deleted

## 2021-04-26 NOTE — Chronic Care Management (AMB) (Signed)
?  Care Management  ? ?Note ? ?04/26/2021 ?Name: Shannon [name] MRN: [id number] DOB: October 26, 1945 ? ?Shannon [name] is a 76 y.o. year old female who is a primary care patient of Mosie Lukes, MD and is actively engaged with the care management team. I reached out to Jeral Pinch by phone today to assist with re-scheduling a follow up visit with the Pharmacist ? ?Follow up plan: ?Unsuccessful telephone outreach attempt made. A HIPAA compliant phone message was left for the patient providing contact information and requesting a return call.  ? ?Leyani Gargus, CCMA ?Care Guide, Embedded Care Coordination ?[place]  Care Management  ?Direct Dial: [phone] ? ? ?

## 2021-04-29 DIAGNOSIS — F0280 Dementia in other diseases classified elsewhere without behavioral disturbance: Secondary | ICD-10-CM | POA: Diagnosis not present

## 2021-04-29 DIAGNOSIS — I10 Essential (primary) hypertension: Secondary | ICD-10-CM | POA: Diagnosis not present

## 2021-04-29 DIAGNOSIS — F32A Depression, unspecified: Secondary | ICD-10-CM

## 2021-04-29 DIAGNOSIS — E785 Hyperlipidemia, unspecified: Secondary | ICD-10-CM | POA: Diagnosis not present

## 2021-05-03 ENCOUNTER — Encounter: Payer: Medicare Other | Admitting: Psychology

## 2021-05-04 ENCOUNTER — Other Ambulatory Visit: Payer: Self-pay | Admitting: Cardiology

## 2021-05-04 DIAGNOSIS — R002 Palpitations: Secondary | ICD-10-CM

## 2021-05-10 ENCOUNTER — Encounter: Payer: Medicare Other | Admitting: Psychology

## 2021-05-10 ENCOUNTER — Ambulatory Visit (INDEPENDENT_AMBULATORY_CARE_PROVIDER_SITE_OTHER): Payer: Medicare Other | Admitting: *Deleted

## 2021-05-10 DIAGNOSIS — E782 Mixed hyperlipidemia: Secondary | ICD-10-CM

## 2021-05-10 DIAGNOSIS — F418 Other specified anxiety disorders: Secondary | ICD-10-CM

## 2021-05-10 DIAGNOSIS — I10 Essential (primary) hypertension: Secondary | ICD-10-CM

## 2021-05-10 DIAGNOSIS — D444 Neoplasm of uncertain behavior of craniopharyngeal duct: Secondary | ICD-10-CM

## 2021-05-10 DIAGNOSIS — M810 Age-related osteoporosis without current pathological fracture: Secondary | ICD-10-CM

## 2021-05-10 DIAGNOSIS — F339 Major depressive disorder, recurrent, unspecified: Secondary | ICD-10-CM

## 2021-05-10 DIAGNOSIS — F0670 Mild neurocognitive disorder due to known physiological condition without behavioral disturbance: Secondary | ICD-10-CM

## 2021-05-10 NOTE — Patient Instructions (Signed)
Visit Information ? ?Thank you for taking time to visit with me today. Please don't hesitate to contact me if I can be of assistance to you before our next scheduled telephone appointment. ? ?Following are the goals we discussed today:  ?Patient Goals/Self-Care Activities: ?Continue to receive personal counseling with LCSW, on a bi-weekly basis, to reduce and manage symptoms of Anxiety and Depression.   ?Keep scheduled appointments with Clint Bolder, LCSW with [place] [phone]), in an effort to receive ongoing mental health counseling and supportive services, as well as reduce and manage symptoms of Anxiety and Depression.   ?~ Next appointment with Clint Bolder, LCSW with [place], scheduled on 06/23/2021 at 2:00 pm. ?Continue to attend the following lecture series, offered by Brynda Rim, Elder Law Attorney and [place] of [employer]: ?~ 05/17/2021 at 10:30am: Elder Care and Asset Protection Workshop: What Happens If One of [place] Gets Sick?  ?Continue to attend the following seminar, offered by [place]: ?~ 05/19/2021 at 6:00pm:  Powerful Tools for Caregivers; The [facility], [address] [place], [place]. ?Contact LCSW directly (# [phone]), if you have questions, need assistance, or if additional social work needs are identified between now and our next scheduled telephone outreach call.  ?Follow-Up:  05/24/2021 at 11:15 am ? ?Please call the care guide team at [phone] if you need to cancel or reschedule your appointment.  ? ?If you are experiencing a Mental Health or [place] or need someone to talk to, please call the Suicide and Crisis Lifeline: 988 ?call the [place] National Suicide Prevention Lifeline: 432-207-6445 or TTY: 734-249-5128 TTY (628)575-9091) to talk to a trained counselor ?call 1-800-273-TALK (toll free, 24 hour hotline) ?go to [facility] [address] [place] [phone]) ?call the [facility]: [phone] ?call 911  ? ?Patient verbalizes understanding of instructions and care plan provided today and agrees to view in [place]. Active MyChart status confirmed with patient.   ? ?Nat Christen LCSW ?Licensed Clinical Social Worker ?[place] ?[phone]  ?

## 2021-05-10 NOTE — Chronic Care Management (AMB) (Signed)
?Chronic Care Management  ? ? Clinical Social Work Note ? ?05/10/2021 ?Name: Shannon [name] MRN: [id number] DOB: 03-Aug-1945 ? ?Shannon [name] is a 76 y.o. year old female who is a primary care patient of Mosie Lukes, MD. The CCM team was consulted to assist the patient with chronic disease management and/or care coordination needs related to: [employer] and [place] and Resources.  ? ?Engaged with patient by telephone for follow up visit in response to provider referral for social work chronic care management and care coordination services.  ? ?Consent to Services:  ?The patient was given information about Chronic Care Management services, agreed to services, and gave verbal consent prior to initiation of services.  Please see initial visit note for detailed documentation.  ? ?Patient agreed to services and consent obtained.  ? ?Assessment: Review of patient past medical history, allergies, medications, and health status, including review of relevant consultants reports was performed today as part of a comprehensive evaluation and provision of chronic care management and care coordination services.    ? ?SDOH (Social Determinants of Health) assessments and interventions performed:   ? ?Advanced Directives Status: Not addressed in this encounter. ? ?CCM Care Plan ? ?Allergies  ?Allergen Reactions  ? Dilaudid [Hydromorphone Hcl] Itching  ? ? ?Outpatient Encounter Medications as of 05/10/2021  ?Medication Sig  ? acetaminophen (TYLENOL) 500 MG tablet Take 1-2 tablets (500-1,000 mg total) by mouth every 8 (eight) hours as needed for moderate pain.  ? albuterol (VENTOLIN HFA) 108 (90 Base) MCG/ACT inhaler INHALE 1-2 PUFFS INTO THE LUNGS EVERY 6 (SIX) HOURS AS NEEDED FOR WHEEZING OR SHORTNESS OF BREATH.  ? Calcium Carbonate (CALCIUM 600 PO) Take 1 tablet by mouth in the morning and at bedtime.  ? carvedilol (COREG) 25 MG tablet TAKE 1 TABLET TWICE DAILY WITH A MEAL  ? cetirizine (ZYRTEC) 10 MG tablet  Take 10 mg by mouth daily.  ? diltiazem (CARDIZEM CD) 120 MG 24 hr capsule TAKE 1 CAPSULE BY MOUTH EVERY DAY  ? docusate sodium (COLACE) 250 MG capsule Take 250 mg by mouth daily as needed for constipation.  ? donepezil (ARICEPT) 10 MG tablet TAKE 1 TABLET AT BEDTIME  ? ergocalciferol (VITAMIN D2) 1.25 MG (50000 UT) capsule Take 2,000 Units by mouth daily.  ? famotidine (PEPCID) 20 MG tablet Take 1 tablet (20 mg total) by mouth 2 (two) times daily.  ? ferrous sulfate 325 (65 FE) MG tablet Take 325 mg by mouth daily with breakfast.  ? folic acid (FOLVITE) 1 MG tablet Take 1 tablet (1 mg total) by mouth daily.  ? gabapentin (NEURONTIN) 300 MG capsule Take 1 capsule (300 mg total) by mouth 2 (two) times daily.  ? hydrOXYzine (ATARAX) 25 MG tablet TAKE 0.5-1 TABLETS (12.5-25 MG TOTAL) BY MOUTH 2 (TWO) TIMES DAILY AS NEEDED.  ? Melatonin 10 MG CAPS Take 1 capsule by mouth at bedtime.  ? mirabegron ER (MYRBETRIQ) 25 MG TB24 tablet Take 1 tablet by mouth daily.  ? Multiple Vitamin (MULTIVITAMIN WITH MINERALS) TABS tablet Take 1 tablet by mouth daily.  ? omeprazole (PRILOSEC) 20 MG capsule TAKE 1 CAPSULE BY MOUTH EVERY DAY  ? pentosan polysulfate (ELMIRON) 100 MG capsule Take 100 mg by mouth as directed. two at morning and two at night  ? rosuvastatin (CRESTOR) 20 MG tablet TAKE 1 TABLET AT BEDTIME  ? telmisartan (MICARDIS) 40 MG tablet Take 1 tablet (40 mg total) by mouth daily.  ? venlafaxine XR (EFFEXOR-XR) 75 MG  24 hr capsule Take 3 capsules (225 mg total) by mouth daily with breakfast.  ? ?No facility-administered encounter medications on file as of 05/10/2021.  ? ? ?Patient Active Problem List  ? Diagnosis Date Noted  ? Dementia ([place]) 03/02/2021  ? Sleep apnea 01/12/2021  ? Depression with anxiety 01/12/2021  ? 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 History of melanoma 11/17/2020  ? Palpitation 10/28/2020  ? 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 Right knee pain 07/10/2020  ?  Urinary frequency 02/11/2020  ? Debility 12/23/2017  ? Left knee pain 12/23/2017  ? Vitamin D deficiency 06/14/2017  ? Vertebral fracture, osteoporotic 04/05/2016  ? Left-sided back pain 03/06/2016  ? Chest pain 02/27/2016  ? Syncope 02/27/2016  ? Cataracts, bilateral 12/28/2015  ? Recurrent falls 12/28/2015  ? Family history- stomach cancer 09/08/2015  ? Family history of cancer 09/08/2015  ? [name] lesion 06/21/2015  ? Obesity 09/28/2014  ? Interstitial cystitis 09/28/2014  ? Pain in joint, ankle and foot 09/28/2014  ? Abnormal nuclear stress test 12/16/2013  ? Sternal pain 11/06/2013  ? Postoperative anemia due to acute blood loss 02/13/2013  ? Congestive dilated cardiomyopathy ([place]) 12/30/2012  ? Breast cancer of lower-inner quadrant of right female breast 10/21/2012  ? Constipation 03/03/2012  ? OA (osteoarthritis) 09/07/2011  ? Dermatitis   ? Hyponatremia 08/28/2011  ? Osteoporosis 03/07/2011  ? Prediabetes 06/15/2009  ? Lipoma 02/10/2009  ? Dyspnea on exertion 02/10/2009  ? Hyperlipidemia, mixed 08/11/2008  ? Major depressive disorder with anxious distress 08/11/2008  ? Essential hypertension 08/11/2008  ? GERD (gastroesophageal reflux disease) 08/11/2008  ? Craniopharyngioma 2000  ? ? ?Conditions to be addressed/monitored: Anxiety and Depression.  Mental Health Concerns, Cognitive Deficits, Memory Deficits, and Lacks Knowledge of [employer]. ? ?Care Plan : LCSW Plan of Care  ?Updates made by Francis Gaines, LCSW since 05/10/2021 12:00 AM  ?  ? ?Problem: Reduce and Manage My Symptoms of Anxiety and Depression.   ?Priority: High  ?  ? ?Goal: Reduce and Manage My Symptoms of Anxiety and Depression.   ?Start Date: 12/30/2020  ?Expected End Date: 05/30/2021  ?This Visit's Progress: On track  ?Recent Progress: On track  ?Priority: High  ?Note:   ?Current Barriers:   ?Acute Mental Health needs related to Debility, Obesity, Major Depressive Disorder with Anxious Distress, and Mild Neurocognitive  Disorder due to Alzheimer's Disease, requires Support, Education, Resources, Referrals, Advocacy, and Care Coordination, in order to meet unmet mental health needs. ?Clinical Goal(s):  ?Patient will work with LCSW to reduce and manage symptoms of Anxiety and Depression, until re-established with a community mental health provider.     ?Patient will increase knowledge and/or ability of:  ?      Coping Skills, Healthy Habits, Self-Management Skills, Stress Reduction, Home Safety and Utilizing [employer] and Resources.   ?Interventions: ?Collaboration with Primary Care Physician, Dr. Penni Homans regarding development and update of comprehensive plan of care, as evidenced by provider attestation and co-signature. ?Inter-disciplinary care team collaboration (see longitudinal plan of care). ?Clinical Interventions:  ?Solution-Focused Therapy Performed. ?Emotional Support Provided and Verbalization of Feelings Encouraged. ?Problem Solving Solutions Developed. ?Client-Centered Therapy Initiated. ?Cognitive Behavioral Therapy Revisited.     ?Patient Goals/Self-Care Activities: ?Continue to receive personal counseling with LCSW, on a bi-weekly basis, to reduce and manage symptoms of Anxiety and Depression.   ?Keep scheduled appointments with Clint Bolder, LCSW with [place] [phone]), in an effort to receive ongoing  mental health counseling and supportive services, as well as reduce and manage symptoms of Anxiety and Depression.   ?~ Next appointment with Clint Bolder, LCSW with [place], scheduled on 06/23/2021 at 2:00 pm. ?Continue to attend the following lecture series, offered by Brynda Rim, Elder Law Attorney and [place] of [employer] [place] [employer]: ?~ 05/17/2021 at 10:30am: Elder Care and Asset Protection Workshop: What Happens If One of [place] Gets Sick?  ?Continue to attend the following seminar, offered by [place]: ?~ 05/19/2021 at 6:00pm:   Powerful Tools for Caregivers; [facility], [address] [place], [place]. ?Contact LCSW directly (# [phone]), if you have questions, need assistance, or if additional social work ne

## 2021-05-11 NOTE — Chronic Care Management (AMB) (Signed)
?  Care Management  ? ?Note ? ?05/11/2021 ?Name: Shannon [name] MRN: [id number] DOB: September 25, 1945 ? ?Shannon [name] is a 76 y.o. year old female who is a primary care patient of Mosie Lukes, MD and is actively engaged with the care management team. I reached out to Jeral Pinch by phone today to assist with re-scheduling a follow up visit with the Pharmacist ? ?Follow up plan: ?Telephone appointment with care management team member scheduled for: 05/25/2021 ? ?Robet Crutchfield, CCMA ?Care Guide, Embedded Care Coordination ?[place]  Care Management  ?Direct Dial: [phone] ? ? ?

## 2021-05-18 ENCOUNTER — Ambulatory Visit (INDEPENDENT_AMBULATORY_CARE_PROVIDER_SITE_OTHER): Payer: Medicare Other | Admitting: Cardiovascular Disease

## 2021-05-18 ENCOUNTER — Encounter: Payer: Self-pay | Admitting: Cardiovascular Disease

## 2021-05-18 DIAGNOSIS — E669 Obesity, unspecified: Secondary | ICD-10-CM

## 2021-05-18 DIAGNOSIS — G4733 Obstructive sleep apnea (adult) (pediatric): Secondary | ICD-10-CM

## 2021-05-18 DIAGNOSIS — J4520 Mild intermittent asthma, uncomplicated: Secondary | ICD-10-CM

## 2021-05-18 DIAGNOSIS — I471 Supraventricular tachycardia: Secondary | ICD-10-CM | POA: Diagnosis not present

## 2021-05-18 DIAGNOSIS — I428 Other cardiomyopathies: Secondary | ICD-10-CM

## 2021-05-18 DIAGNOSIS — E782 Mixed hyperlipidemia: Secondary | ICD-10-CM

## 2021-05-18 DIAGNOSIS — I10 Essential (primary) hypertension: Secondary | ICD-10-CM | POA: Diagnosis not present

## 2021-05-18 DIAGNOSIS — G4730 Sleep apnea, unspecified: Secondary | ICD-10-CM

## 2021-05-18 NOTE — Progress Notes (Signed)
? ?Cardiology Office Note   ? ?Date:  05/22/2021  ? ?ID:  Shannon [name], DOB 07-20-1945, MRN [id number] ? ?PCP:  Mosie Lukes, MD  ?Cardiologist:  Shelva Majestic, MD (sleep); Dr. Stanford Breed ? ?New sleep evaluaiton ? ? ?History of Present Illness:  ?Shannon [name] is a 76 y.o. female who is followed by Dr. Kirk Ruths for cardiology care.  She has a history of a nonischemic cardiomyopathy and in 2012 EF was 45 to 50% with reduction of LV function felt secondary to Herceptin chemotherapy.  EF is subsequently normalized.  She has a history of SVT which has been treated with Cardizem.  Due to concerns for obstructive sleep apnea, she was referred for an in lab diagnostic polysomnogram on December 15, 2020.  This was notable for mild overall sleep apnea with an AHI of 5.6/h; however her respiratory disturbance index was severely increased at 45.8/h.  She was unable to achieve any REM sleep.  There was no significant oxygen desaturation.  At the time, with her cardiovascular history, a trial of CPAP THERAPY was recommended ?The alternative to CPAP therapy such as a customized oral appliance could also be considered.  Apparently, she opted to undergo CPAP therapy and her CPAP set up date with [facility] is her DME company was January 27, 2021.  She received a new ResMed AirSense 11 auto set CPAP unit.  A download from December 29 through February 25, 2021 was done it which she barely met compliance standards with usage days at 73% and uses greater than 4 hours at 70%.  Average sleep duration was 6 hours and 4 minutes.  Her AutoSet unit was set at a range of 6 to 16 cm of water.  AHI was excellent at 0.8.  A subsequent download was obtained from March 23 May 17, 2021 where she was not compliant with usage at only 37% and average usage but only 3 hours and 55 minutes.  AHI remained excellent at 0.5/h with 95th percentile pressure at 14.6 and maximum average pressure 15.5 cm of water.  She states that she has asthma and for  this reason was concerned that this could be a problem and therefore her usage significantly declined.  Typically she goes to bed at 1115 and wakes up between 8 and 10 AM.  Currently she is on carvedilol 25 mg twice a day, telmisartan 40 mg daily diltiazem 120 mg daily both for blood pressure and her SVT.  She is on famotidine for GERD, she has a history of neuropathy on gabapentin, GERD on omeprazole, and Effexor for anxiety.  She presents for her initial evaluation. ? ? ?Past Medical History:  ?Diagnosis Date  ? Abnormal nuclear stress test 12/16/2013  ? Anemia 08/28/2011  ? Asthma   ? as a child  ? Breast cancer of lower-inner quadrant of right female breast 2011  ? Carpal tunnel syndrome of left wrist   ? Cataracts, bilateral 12/28/2015  ? Chest pain 02/27/2016  ? Closed fracture of maxilla 10/17/2017  ? 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 Constipation 03/03/2012  ? 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 Dermatitis   ? Dyspnea on exertion 02/10/2009  ? Essential hypertension 08/11/2008  ? Well controlled, no changes to meds. Encouraged heart healthy diet such as the DASH diet  ? Facial fracture   ? 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 GERD (gastroesophageal reflux disease) 08/11/2008  ? History of blood transfusion   ? History of chicken pox   ?  History of hiatal hernia   ? History of measles   ? History of melanoma 2008  ? History of mumps   ? Hyperlipidemia, mixed 08/11/2008  ? Hyponatremia 08/28/2011  ? 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 Interstitial cystitis   ? Left knee pain 12/23/2017  ? Left-sided back pain 03/06/2016  ? Lipoma 02/10/2009  ? Major depressive disorder with anxious distress 08/11/2008  ? 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 [name] lesion 06/21/2015  ? Neuropathy   ? NICM (nonischemic cardiomyopathy) 03/08/2010  ? likely 2/2 chemotx - a. Echo 2012: EF 45-50%;  b. Lex MV 2/12:  low risk, apical defect (small area of  ischemia vs shifting breast atten);  c.  Echo 7/12: Normal wall thickness, EF 60-65%, normal wall motion, grade 1 diastolic dysfunction, mild LAE, PASP 32;   d. Lex MV 11/13:  EF 76%, no ischemia  ? OA (osteoarthritis) 09/07/2011  ? Obesity 09/28/2014  ? Osteoporosis 03/07/2011  ? DEXA T score -2.6 AP spine 03/07/11    Formatting of this note might be different from the original. Formatting of this note might be different from the original. DEXA T score -2.6 AP spine 03/07/11   Last Assessment & Plan:  Formatting of this note might be different from the original. Encouraged to get adequate exercise, calcium and vitamin d intake  ? Pain in joint, ankle and foot 09/28/2014  ? Palpitation 10/28/2020  ? Personal history of chemotherapy 2012  ? Personal history of radiation therapy 2012  ? Prediabetes 06/15/2009  ? Recurrent falls 12/28/2015  ? Right knee pain 07/10/2020  ? Shortness of breath   ? UTI (urinary tract infection) 03/03/2012  ? Vertebral fracture, osteoporotic, sequela 04/05/2016  ? Vitamin D deficiency 06/14/2017  ? Supplement and monitor  ? ? ?Past Surgical History:  ?Procedure Laterality Date  ? BREAST LUMPECTOMY  04/2009  ? RIGHT FOR BREAST CANCER-CHEMO/RADIATION X 1 YEAR  ? BREAST REDUCTION SURGERY Bilateral 05/04/2014  ? Procedure: MAMMARY REDUCTION  (BREAST);  Surgeon: Cristine Polio, MD;  Location: [facility];  Service: Plastics;  Laterality: Bilateral;  ? CARPAL TUNNEL RELEASE    ? L wrist, ulnar nerve moved  ? CHOLECYSTECTOMY    ? CRANIOTOMY FOR TUMOR  2000  ? ELBOW SURGERY    ? left  ? KNEE ARTHROSCOPY Left 10/12/2014  ? Procedure: LEFT KNEE ARTHROSCOPY ;  Surgeon: Gaynelle Arabian, MD;  Location: [facility];  Service: Orthopedics;  Laterality: Left;  ? KYPHOPLASTY N/A 03/30/2016  ? Procedure: THORACIC 12 KYPHOPLASTY;  Surgeon: Phylliss Bob, MD;  Location: [facility];  Service: Orthopedics;  Laterality: N/A;  THORACIC 12 KYPHOPLASTY  ? LEFT HEART CATHETERIZATION WITH CORONARY ANGIOGRAM N/A 12/16/2013   ? Procedure: LEFT HEART CATHETERIZATION WITH CORONARY ANGIOGRAM;  Surgeon: Peter M [name], MD;  [facility] C[facility];  Service: Cardiovascular;  Laterality: N/A;  ? LIPOMA EXCISION  03/28/2009  ? right leg  ? MELANOMA EXCISION    ? PORT-A-CATH REMOVAL  11/30/2010  ? [place]  ? porta cath    ? PORTACATH PLACEMENT  may 2011  ? REDUCTION MAMMAPLASTY Bilateral   ? SYNOVECTOMY Left 10/12/2014  ? Procedure: WITH SYNOVECTOMY;  Surgeon: Gaynelle Arabian, MD;  Location: [facility];  Service: Orthopedics;  Laterality: Left;  ? TONSILLECTOMY  1958  ? TOTAL KNEE ARTHROPLASTY  02/05/2012  ? Procedure: TOTAL KNEE ARTHROPLASTY;  Surgeon: Gearlean Alf, MD;  Location: [facility];  Service: Orthopedics;  Laterality: Right;  ?  TOTAL KNEE ARTHROPLASTY Left 02/10/2013  ? Procedure: LEFT TOTAL KNEE ARTHROPLASTY;  Surgeon: Gearlean Alf, MD;  Location: [facility];  Service: Orthopedics;  Laterality: Left;  ? total knee raplacement  01-2012  ? Right Knee  ? TOTAL KNEE REVISION Left 07/14/2020  ? Procedure: TOTAL KNEE REVISION;  Surgeon: Gaynelle Arabian, MD;  Location: [facility];  Service: Orthopedics;  Laterality: Left;  ? TUBAL LIGATION  1997  ? ? ?Current Medications: ?Outpatient Medications Prior to Visit  ?Medication Sig Dispense Refill  ? acetaminophen (TYLENOL) 500 MG tablet Take 1-2 tablets (500-1,000 mg total) by mouth every 8 (eight) hours as needed for moderate pain. 100 tablet 2  ? albuterol (VENTOLIN HFA) 108 (90 Base) MCG/ACT inhaler INHALE 1-2 PUFFS INTO THE LUNGS EVERY 6 (SIX) HOURS AS NEEDED FOR WHEEZING OR SHORTNESS OF BREATH. 18 each 3  ? Calcium Carbonate (CALCIUM 600 PO) Take 1 tablet by mouth in the morning and at bedtime.    ? carvedilol (COREG) 25 MG tablet TAKE 1 TABLET TWICE DAILY WITH A MEAL 180 tablet 3  ? cetirizine (ZYRTEC) 10 MG tablet Take 10 mg by mouth daily.    ? diltiazem (CARDIZEM CD) 120 MG 24 hr capsule TAKE 1 CAPSULE BY MOUTH EVERY DAY 90 capsule 2  ? docusate sodium (COLACE) 250 MG capsule Take 250 mg by mouth daily as  needed for constipation.    ? donepezil (ARICEPT) 10 MG tablet TAKE 1 TABLET AT BEDTIME 90 tablet 0  ? ergocalciferol (VITAMIN D2) 1.25 MG (50000 UT) capsule Take 2,000 Units by mouth daily.    ? famotidi

## 2021-05-18 NOTE — Patient Instructions (Signed)
Medication Instructions:  ?The current medical regimen is effective;  continue present plan and medications as directed. Please refer to the Current Medication list given to you today.  ? ?*If you need a refill on your cardiac medications before your next appointment, please call your pharmacy* ? ?Lab Work:   Testing/Procedures:  ?NONE    NONE ?If you have labs (blood work) drawn today and your tests are completely normal, you will receive your results only by: ?MyChart Message (if you have MyChart) OR  A paper copy in the mail ?If you have any lab test that is abnormal or we need to change your treatment, we will call you to review the results. ? ?Follow-Up: ?Your next appointment:  12 month(s) In Person with DR [place]   ? ?Please call our office 2 months in advance to schedule this appointment  ? ?At [facility], you and your health needs are our priority.  As part of our continuing mission to provide you with exceptional heart care, we have created designated Provider Care Teams.  These Care Teams include your primary Cardiologist (physician) and Advanced Practice Providers (APPs -  Physician Assistants and Nurse Practitioners) who all work together to provide you with the care you need, when you need it. ? ?We recommend signing up for the patient portal called "MyChart".  Sign up information is provided on this After Visit Summary.  MyChart is used to connect with patients for Virtual Visits (Telemedicine).  Patients are able to view lab/test results, encounter notes, upcoming appointments, etc.  Non-urgent messages can be sent to your provider as well.   ?To learn more about what you can do with MyChart, go to [email or web address].   ? ? ? ?Important Information About Sugar ? ? ? ? ? ? ? ?  ? ?

## 2021-05-19 ENCOUNTER — Ambulatory Visit: Payer: Medicare Other

## 2021-05-19 DIAGNOSIS — F418 Other specified anxiety disorders: Secondary | ICD-10-CM

## 2021-05-19 DIAGNOSIS — I10 Essential (primary) hypertension: Secondary | ICD-10-CM

## 2021-05-19 NOTE — Patient Instructions (Addendum)
Visit Information ? ?Thank you for taking time to visit with me today. Please don't hesitate to contact me if I can be of assistance to you before our next scheduled telephone appointment. ? ?Following are the goals we discussed today:  ?Patient Goals/Self-Care Activities: ?Take medications as prescribed   ?Attend all scheduled provider appointments ?Call provider office for new concerns or questions  ?Continue to Eat Healthy: fruits, vegetables, lean meat, healthy fats; avoid trans fats and simple carbohydrate and processed foods, consider a krill, fish oil or flaxseed capsule daily. Discussed ground flax seed can added to foods such as greek yogurt and oat meal.   ?Participate in some type of activity you enjoy every day ?Continue to work with Care Management Team, RN Case manager and/or Licensed clinical social worker ; Nurse, adult for care management and care coordination needs ?Camera operator of [place] as needed ? ?Our next appointment is by telephone on 06/30/21 at 2:00 pm ? ?Please call the care guide team at [phone] if you need to cancel or reschedule your appointment.  ? ?If you are experiencing a Mental Health or [place] or need someone to talk to, please call the Suicide and Crisis Lifeline: 988 ?call 1-800-273-TALK (toll free, 24 hour hotline)  ? ?Patient verbalizes understanding of instructions and care plan provided today and agrees to view in [place]. Active MyChart status confirmed with patient.   ? ?Thea Silversmith, RN, MSN, BSN, CCM ?Care Management Coordinator ?[facility] [place] [facility] ?[phone]  ?

## 2021-05-19 NOTE — Chronic Care Management (AMB) (Signed)
?Chronic Care Management  ? ?CCM RN Visit Note ? ?05/19/2021 ?Name: Shannon [name] MRN: [id number] DOB: [birth year] ? ?Subjective: ?Shannon [name] is a 76 y.o. year old female who is a primary care patient of Shannon Lukes, MD. The care management team was consulted for assistance with disease management and care coordination needs.   ? ?Engaged with patient by telephone for follow up visit in response to provider referral for case management and/or care coordination services.  ? ?Consent to Services:  ?The patient was given information about Chronic Care Management services, agreed to services, and gave verbal consent prior to initiation of services.  Please see initial visit note for detailed documentation.  ? ?Patient agreed to services and verbal consent obtained.  ? ?Assessment: Review of patient past medical history, allergies, medications, health status, including review of consultants reports, laboratory and other test data, was performed as part of comprehensive evaluation and provision of chronic care management services.  ? ?SDOH (Social Determinants of Health) assessments and interventions performed:   ? ?CCM Care Plan ? ?Allergies  ?Allergen Reactions  ? Dilaudid [Hydromorphone Hcl] Itching  ? ? ?Outpatient Encounter Medications as of 05/19/2021  ?Medication Sig  ? acetaminophen (TYLENOL) 500 MG tablet Take 1-2 tablets (500-1,000 mg total) by mouth every 8 (eight) hours as needed for moderate pain.  ? albuterol (VENTOLIN HFA) 108 (90 Base) MCG/ACT inhaler INHALE 1-2 PUFFS INTO THE LUNGS EVERY 6 (SIX) HOURS AS NEEDED FOR WHEEZING OR SHORTNESS OF BREATH.  ? Calcium Carbonate (CALCIUM 600 PO) Take 1 tablet by mouth in the morning and at bedtime.  ? carvedilol (COREG) 25 MG tablet TAKE 1 TABLET TWICE DAILY WITH A MEAL  ? cetirizine (ZYRTEC) 10 MG tablet Take 10 mg by mouth daily.  ? diltiazem (CARDIZEM CD) 120 MG 24 hr capsule TAKE 1 CAPSULE BY MOUTH EVERY DAY  ? docusate sodium (COLACE) 250 MG capsule Take 250  mg by mouth daily as needed for constipation.  ? donepezil (ARICEPT) 10 MG tablet TAKE 1 TABLET AT BEDTIME  ? ergocalciferol (VITAMIN D2) 1.25 MG (50000 UT) capsule Take 2,000 Units by mouth daily.  ? famotidine (PEPCID) 20 MG tablet Take 1 tablet (20 mg total) by mouth 2 (two) times daily.  ? ferrous sulfate 325 (65 FE) MG tablet Take 325 mg by mouth daily with breakfast.  ? folic acid (FOLVITE) 1 MG tablet Take 1 tablet (1 mg total) by mouth daily.  ? gabapentin (NEURONTIN) 300 MG capsule Take 1 capsule (300 mg total) by mouth 2 (two) times daily.  ? hydrOXYzine (ATARAX) 25 MG tablet TAKE 0.5-1 TABLETS (12.5-25 MG TOTAL) BY MOUTH 2 (TWO) TIMES DAILY AS NEEDED.  ? Melatonin 10 MG CAPS Take 1 capsule by mouth at bedtime.  ? mirabegron ER (MYRBETRIQ) 25 MG TB24 tablet Take 1 tablet by mouth daily.  ? Multiple Vitamin (MULTIVITAMIN WITH MINERALS) TABS tablet Take 1 tablet by mouth daily.  ? omeprazole (PRILOSEC) 20 MG capsule TAKE 1 CAPSULE BY MOUTH EVERY DAY  ? pentosan polysulfate (ELMIRON) 100 MG capsule Take 100 mg by mouth as directed. two at morning and two at night  ? rosuvastatin (CRESTOR) 20 MG tablet TAKE 1 TABLET AT BEDTIME  ? telmisartan (MICARDIS) 40 MG tablet Take 1 tablet (40 mg total) by mouth daily.  ? venlafaxine XR (EFFEXOR-XR) 75 MG 24 hr capsule Take 3 capsules (225 mg total) by mouth daily with breakfast.  ? ?No facility-administered encounter medications on file as of 05/19/2021.  ? ? ?  Patient Active Problem List  ? Diagnosis Date Noted  ? Dementia (Shannon [name]) 03/02/2021  ? Sleep apnea 01/12/2021  ? Depression with anxiety 01/12/2021  ? 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 History of melanoma 11/17/2020  ? Palpitation 10/28/2020  ? 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 Right knee pain 07/10/2020  ? Urinary frequency 02/11/2020  ? Debility 12/23/2017  ? Left knee pain 12/23/2017  ? Vitamin D deficiency 06/14/2017  ? Vertebral fracture, osteoporotic 04/05/2016   ? Left-sided back pain 03/06/2016  ? Chest pain 02/27/2016  ? Syncope 02/27/2016  ? Cataracts, bilateral 12/28/2015  ? Recurrent falls 12/28/2015  ? Family history- stomach cancer 09/08/2015  ? Family history of cancer 09/08/2015  ? [name] lesion 06/21/2015  ? Obesity 09/28/2014  ? Interstitial cystitis 09/28/2014  ? Pain in joint, ankle and foot 09/28/2014  ? Abnormal nuclear stress test 12/16/2013  ? Sternal pain 11/06/2013  ? Postoperative anemia due to acute blood loss 02/13/2013  ? Congestive dilated cardiomyopathy ([place]) 12/30/2012  ? Breast cancer of lower-inner quadrant of right female breast 10/21/2012  ? Constipation 03/03/2012  ? OA (osteoarthritis) 09/07/2011  ? Dermatitis   ? Hyponatremia 08/28/2011  ? Osteoporosis 03/07/2011  ? Prediabetes 06/15/2009  ? Lipoma 02/10/2009  ? Dyspnea on exertion 02/10/2009  ? Hyperlipidemia, mixed 08/11/2008  ? Major depressive disorder with anxious distress 08/11/2008  ? Essential hypertension 08/11/2008  ? GERD (gastroesophageal reflux disease) 08/11/2008  ? Craniopharyngioma 2000  ? ? ?Conditions to be addressed/monitored:HTN, Anxiety, Depression, and Dementia ? ?Care Plan : RN Case Manager Plan of Care  ?Updates made by Shannon Rued, RN since 05/19/2021 12:00 AM  ?  ? ?Problem: No Plan of Care Established for Managment of Chronic Disease (Neurocognitive Disorder, Anxiety/Depression)   ?Priority: High  ?  ? ?Long-Range Goal: Development of Plan of Care for Chronic Disease Management (Neurocognitive Disorder, Anxiety/Depression)   ?Start Date: 12/13/2020  ?Expected End Date: 09/15/2021  ?Priority: High  ?Note:   ?Current Barriers:  ?04/15/21 Mrs. Shannon [name] reports she has had to use rescue inhaler recently, adding this is normal for her this time of year due to history of asthma/allergies. She reports she is taking allergy medication. She expresses her main concern today is cost of Elmiron-cost > 1600 for a 3 month refill. She has medication assistance form  provided by embedded clinical pharmacist and states she is going to fill out her part and request urologist complete the remaining portion.  Reports she has attended seminar with Brynda Rim for estate planning and has read the Book, "you are not alone"-recommended by Nat Christen, LCSW. ?05/19/21 Mrs. Mccauley reports she has all her medications. She states interstitial cystitis is stable. She reports feeling a little down today because she noticed some issues with memory/recall over the last couple days. She acknowledges that she has been doing a lot of multitasking. Adding they have been doing some work to her home. And she has to declutter the house and clean out her son's (deceased) room. She acknowledges that it is hard for her to clean out his room. She denies any problems with her blood pressure. Next appointment with PCP  06/23/21. ?Knowledge Deficits related to plan of care for management of neurocognitive disorder, anxiety/depression  ?Cognitive Deficits/Memory loss-reports strategies for managing memory loss, keeping notes, writing things down. ?Medication management-improved. Daughter assist with medication management.  ? ?RNCM Clinical Goal(s):  ?Patient will verbalize understanding of plan for management of Anxiety, Depression,  and Dementia as evidenced by taking medications as prescribed, following up with provider appointments as schedule, engaging with LCSW and/or referred specialist. ?take all medications exactly as prescribed and will call provider for medication related questions as evidenced by medication adherance    ?work with pharmacist to address medications assistance program if available for Elmiron and follow up on medication managment related to chronic disease as evidenced by review of EMR and patient or pharmacist report    ?work with Education officer, museum to address Mental Health Concerns , Cognitive Deficits, and Memory Deficits related to the management of Anxiety and Depression as  evidenced by review of EMR and patient or social worker report     through collaboration with Consulting civil engineer, provider, and care team.  ? ?Interventions: ?1:1 collaboration with primary care provider regarding dev

## 2021-05-22 ENCOUNTER — Encounter: Payer: Self-pay | Admitting: Cardiovascular Disease

## 2021-05-24 ENCOUNTER — Ambulatory Visit: Payer: Medicare Other | Admitting: *Deleted

## 2021-05-24 ENCOUNTER — Encounter: Payer: Self-pay | Admitting: *Deleted

## 2021-05-24 DIAGNOSIS — R9439 Abnormal result of other cardiovascular function study: Secondary | ICD-10-CM

## 2021-05-24 DIAGNOSIS — F418 Other specified anxiety disorders: Secondary | ICD-10-CM

## 2021-05-24 DIAGNOSIS — I10 Essential (primary) hypertension: Secondary | ICD-10-CM

## 2021-05-24 DIAGNOSIS — E782 Mixed hyperlipidemia: Secondary | ICD-10-CM

## 2021-05-24 DIAGNOSIS — E669 Obesity, unspecified: Secondary | ICD-10-CM

## 2021-05-24 DIAGNOSIS — F339 Major depressive disorder, recurrent, unspecified: Secondary | ICD-10-CM

## 2021-05-24 DIAGNOSIS — F0670 Mild neurocognitive disorder due to known physiological condition without behavioral disturbance: Secondary | ICD-10-CM

## 2021-05-24 DIAGNOSIS — M810 Age-related osteoporosis without current pathological fracture: Secondary | ICD-10-CM

## 2021-05-24 DIAGNOSIS — D444 Neoplasm of uncertain behavior of craniopharyngeal duct: Secondary | ICD-10-CM

## 2021-05-24 NOTE — Patient Instructions (Addendum)
Visit Information ? ?Thank you for taking time to visit with me today. Please don't hesitate to contact me if I can be of assistance to you before our next scheduled telephone appointment. ? ?Following are the goals we discussed today:  ?Patient Goals/Self-Care Activities: ?Continue to receive personal counseling with LCSW, on a bi-weekly basis, to reduce and manage symptoms of Anxiety and Depression.   ?Keep scheduled appointments with Clint Bolder, LCSW with [facility] [phone]), in an effort to receive ongoing mental health counseling and supportive services, as well as reduce and manage symptoms of Anxiety and Depression.   ?~ Next appointment with Clint Bolder, LCSW with [facility], scheduled on 06/23/2021 at 2:00 pm. ?Continue to attend monthly seminars, offered by [facility]. ?~ Caregiver Connections Expo, on 06/17/2021 from 10:00 am - 3:00 pm; [facility], [address], [place], [place]. ?Review article on "Friendship and Dementia: Hints and Tips on Supporting Friends with Dementia", mailed to your home on 05/24/2021. ?Remember, you are not alone. ?~ For people living with dementia, maintaining meaningful friendships can be a difficult task. A dementia diagnosis can turn someone?s world upside down and it?s at this time that friendship is valued the most, not only to offer comfort and support but to help to maintain an essence of normality. Sadly though, many friendships break down after someone has been diagnosed.   ?~ Two out of three people living with dementia have lost friendships following their diagnosis. ?~ 60% of people living with dementia felt reluctant to attend social situations after their diagnosis. ?~ 91% of participants felt that there was not enough public knowledge of dementia and what it?s like to live with the illness. ?~ These findings show that more must be done to improve public understanding of  attitudes towards dementia, so we are able to help friendships adapt following a diagnosis and throughout the illness.  ?Contact LCSW directly (# [phone]), if you have questions, need assistance, or if additional social work needs are identified between now and our next scheduled telephone outreach call.  ?Follow-Up:  06/07/2021 at 10:45 am ? ?Please call the care guide team at [phone] if you need to cancel or reschedule your appointment.  ? ?If you are experiencing a Mental Health or [place] or need someone to talk to, please call the Suicide and Crisis Lifeline: 988 ?call the [place] National Suicide Prevention Lifeline: 971 009 5681 or TTY: (580) 846-7610 TTY 3645215302) to talk to a trained counselor ?call 1-800-273-TALK (toll free, 24 hour hotline) ?go to [facility] [address], [place] [phone]) ?call the [facility]: [phone] ?call 911  ? ?Patient verbalizes understanding of instructions and care plan provided today and agrees to view in [place]. Active MyChart status confirmed with patient.   ? ?Nat Christen LCSW ?Licensed Clinical Social Worker ?[place] ?[phone]  ?

## 2021-05-24 NOTE — Chronic Care Management (AMB) (Signed)
?Chronic Care Management  ? ? Clinical Social Work Note ? ?05/24/2021 ?Name: Shannon [name] MRN: [id number] DOB: 03/17/1945 ? ?Shannon [name] is a 77 y.o. year old female who is a primary care patient of Mosie Lukes, MD. The CCM team was consulted to assist the patient with chronic disease management and/or care coordination needs related to: [employer] and [employer].  ? ?Engaged with patient by telephone for follow up visit in response to provider referral for social work chronic care management and care coordination services.  ? ?Consent to Services:  ?The patient was given information about Chronic Care Management services, agreed to services, and gave verbal consent prior to initiation of services.  Please see initial visit note for detailed documentation.  ? ?Patient agreed to services and consent obtained.  ? ?Assessment: Review of patient past medical history, allergies, medications, and health status, including review of relevant consultants reports was performed today as part of a comprehensive evaluation and provision of chronic care management and care coordination services.    ? ?SDOH (Social Determinants of Health) assessments and interventions performed:   ? ?Advanced Directives Status: Not addressed in this encounter. ? ?CCM Care Plan ? ?Allergies  ?Allergen Reactions  ? Dilaudid [Hydromorphone Hcl] Itching  ? ? ?Outpatient Encounter Medications as of 05/24/2021  ?Medication Sig  ? acetaminophen (TYLENOL) 500 MG tablet Take 1-2 tablets (500-1,000 mg total) by mouth every 8 (eight) hours as needed for moderate pain.  ? albuterol (VENTOLIN HFA) 108 (90 Base) MCG/ACT inhaler INHALE 1-2 PUFFS INTO THE LUNGS EVERY 6 (SIX) HOURS AS NEEDED FOR WHEEZING OR SHORTNESS OF BREATH.  ? Calcium Carbonate (CALCIUM 600 PO) Take 1 tablet by mouth in the morning and at bedtime.  ? carvedilol (COREG) 25 MG tablet TAKE 1 TABLET TWICE DAILY WITH A MEAL  ? cetirizine (ZYRTEC) 10 MG tablet  Take 10 mg by mouth daily.  ? diltiazem (CARDIZEM CD) 120 MG 24 hr capsule TAKE 1 CAPSULE BY MOUTH EVERY DAY  ? docusate sodium (COLACE) 250 MG capsule Take 250 mg by mouth daily as needed for constipation.  ? donepezil (ARICEPT) 10 MG tablet TAKE 1 TABLET AT BEDTIME  ? ergocalciferol (VITAMIN D2) 1.25 MG (50000 UT) capsule Take 2,000 Units by mouth daily.  ? famotidine (PEPCID) 20 MG tablet Take 1 tablet (20 mg total) by mouth 2 (two) times daily.  ? ferrous sulfate 325 (65 FE) MG tablet Take 325 mg by mouth daily with breakfast.  ? folic acid (FOLVITE) 1 MG tablet Take 1 tablet (1 mg total) by mouth daily.  ? gabapentin (NEURONTIN) 300 MG capsule Take 1 capsule (300 mg total) by mouth 2 (two) times daily.  ? hydrOXYzine (ATARAX) 25 MG tablet TAKE 0.5-1 TABLETS (12.5-25 MG TOTAL) BY MOUTH 2 (TWO) TIMES DAILY AS NEEDED.  ? Melatonin 10 MG CAPS Take 1 capsule by mouth at bedtime.  ? mirabegron ER (MYRBETRIQ) 25 MG TB24 tablet Take 1 tablet by mouth daily.  ? Multiple Vitamin (MULTIVITAMIN WITH MINERALS) TABS tablet Take 1 tablet by mouth daily.  ? omeprazole (PRILOSEC) 20 MG capsule TAKE 1 CAPSULE BY MOUTH EVERY DAY  ? pentosan polysulfate (ELMIRON) 100 MG capsule Take 100 mg by mouth as directed. two at morning and two at night  ? rosuvastatin (CRESTOR) 20 MG tablet TAKE 1 TABLET AT BEDTIME  ? telmisartan (MICARDIS) 40 MG tablet Take 1 tablet (40 mg total) by mouth daily.  ? venlafaxine XR (EFFEXOR-XR) 75 MG  24 hr capsule Take 3 capsules (225 mg total) by mouth daily with breakfast.  ? ?No facility-administered encounter medications on file as of 05/24/2021.  ? ? ?Patient Active Problem List  ? Diagnosis Date Noted  ? Dementia ([place]) 03/02/2021  ? Sleep apnea 01/12/2021  ? Depression with anxiety 01/12/2021  ? 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 History of melanoma 11/17/2020  ? Palpitation 10/28/2020  ? 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 Right knee pain 07/10/2020  ?  Urinary frequency 02/11/2020  ? Debility 12/23/2017  ? Left knee pain 12/23/2017  ? Vitamin D deficiency 06/14/2017  ? Vertebral fracture, osteoporotic 04/05/2016  ? Left-sided back pain 03/06/2016  ? Chest pain 02/27/2016  ? Syncope 02/27/2016  ? Cataracts, bilateral 12/28/2015  ? Recurrent falls 12/28/2015  ? Family history- stomach cancer 09/08/2015  ? Family history of cancer 09/08/2015  ? [name] lesion 06/21/2015  ? Obesity 09/28/2014  ? Interstitial cystitis 09/28/2014  ? Pain in joint, ankle and foot 09/28/2014  ? Abnormal nuclear stress test 12/16/2013  ? Sternal pain 11/06/2013  ? Postoperative anemia due to acute blood loss 02/13/2013  ? 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 Breast cancer of lower-inner quadrant of right female breast 10/21/2012  ? Constipation 03/03/2012  ? OA (osteoarthritis) 09/07/2011  ? Dermatitis   ? Hyponatremia 08/28/2011  ? Osteoporosis 03/07/2011  ? Prediabetes 06/15/2009  ? Lipoma 02/10/2009  ? Dyspnea on exertion 02/10/2009  ? Hyperlipidemia, mixed 08/11/2008  ? Major depressive disorder with anxious distress 08/11/2008  ? Essential hypertension 08/11/2008  ? GERD (gastroesophageal reflux disease) 08/11/2008  ? Craniopharyngioma 2000  ? ? ?Conditions to be addressed/monitored: Anxiety and Depression.  Limited Social Support, Mental Health Concerns, Social Isolation, Cognitive Deficits, Memory Deficits, and Lacks Knowledge of [employer]. ? ?Care Plan : LCSW Plan of Care  ?Updates made by Francis Gaines, LCSW since 05/24/2021 12:00 AM  ?  ? ?Problem: Reduce and Manage My Symptoms of Anxiety and Depression.   ?Priority: High  ?  ? ?Goal: Reduce and Manage My Symptoms of Anxiety and Depression.   ?Start Date: 12/30/2020  ?Expected End Date: 06/30/2021  ?This Visit's Progress: On track  ?Recent Progress: On track  ?Priority: High  ?Note:   ?Current Barriers:   ?Acute Mental Health needs related to Debility, Obesity, Major Depressive Disorder with  Anxious Distress, and Mild Neurocognitive Disorder due to Alzheimer's Disease, requires Support, Education, Resources, Referrals, Advocacy, and Care Coordination, in order to meet unmet mental health needs. ?Clinical Goal(s):  ?Patient will work with LCSW to reduce and manage symptoms of Anxiety and Depression, until re-established with a community mental health provider.     ?Patient will increase knowledge and/or ability of:  ?      Coping Skills, Healthy Habits, Self-Management Skills, Stress Reduction, Home Safety and Utilizing [employer] and Resources.   ?Interventions: ?Collaboration with Primary Care Physician, Dr. Penni Homans regarding development and update of comprehensive plan of care, as evidenced by provider attestation and co-signature. ?Inter-disciplinary care team collaboration (see longitudinal plan of care). ?Clinical Interventions:  ?Solution-Focused Therapy Performed. ?Emotional Support Provided and Verbalization of Feelings Encouraged. ?Problem Solving Solutions Developed. ?Client-Centered Therapy Initiated. ?Cognitive Behavioral Therapy Revisited.     ?Patient Goals/Self-Care Activities: ?Continue to receive personal counseling with LCSW, on a bi-weekly basis, to reduce and manage symptoms of Anxiety and Depression.   ?Keep scheduled appointments with Clint Bolder, LCSW with [facility] [phone]), in  an effort to receive ongoing mental health counseling and supportive services, as well as reduce and manage symptoms of Anxiety and Depression.   ?~ Next appointment with Clint Bolder, LCSW with [facility], scheduled on 06/23/2021 at 2:00 pm. ?Continue to attend monthly seminars, offered by [facility]. ?~ Caregiver Connections Expo, on 06/17/2021 from 10:00 am - 3:00 pm; [facility], [address] [place], [place]. ?Review article on "Friendship and Dementia: Hints and Tips on Supporting Friends with  Dementia", mailed to your home on 05/24/2021. ?Remember, you are not alone. ?~ For people living with dementia, maintaining meaningful friendships can be a difficult task. A dementia diagnosis can turn someone

## 2021-05-25 ENCOUNTER — Ambulatory Visit: Payer: Medicare Other | Admitting: Pharmacist

## 2021-05-25 DIAGNOSIS — E782 Mixed hyperlipidemia: Secondary | ICD-10-CM

## 2021-05-25 DIAGNOSIS — K2100 Gastro-esophageal reflux disease with esophagitis, without bleeding: Secondary | ICD-10-CM

## 2021-05-25 DIAGNOSIS — N3010 Interstitial cystitis (chronic) without hematuria: Secondary | ICD-10-CM

## 2021-05-25 DIAGNOSIS — I10 Essential (primary) hypertension: Secondary | ICD-10-CM

## 2021-05-25 DIAGNOSIS — F0670 Mild neurocognitive disorder due to known physiological condition without behavioral disturbance: Secondary | ICD-10-CM

## 2021-05-25 MED ORDER — FAMOTIDINE 20 MG PO TABS: 20.0000 mg | ORAL_TABLET | Freq: Every day | ORAL | 1 refills | Status: DC

## 2021-05-25 NOTE — Chronic Care Management (AMB) (Signed)
? ? ?Chronic Care Management ?Pharmacy Note ? ?05/25/2021 ?Name:  Shannon [name] MRN:  [id number] DOB:  [birth year] ? ?Summary: ?Cost of Elmiron has increased. Discussed applying for patient assistance program in 2023 (patient did not qualify in 2022 due to selling property and financial situation has changed in 2023). Mailed application for Elmiron.  ?Check medication assistance program for Myrbetriq however patient does not qualify for Myrbetriq patient assistance program (cannot have coverage for Myrbetriq)  ?Reviewed medications and indications. Reviewed medication list / screened for drug-drug interactions. No significant interactions identified.  ?Patient states she would like to take fewer medications. Will try to decrease famotidine dose from twice a day to 74m at bedtime. If no change in reflux symptoms may try to stop famotidine. Recommended she continue to take omeprazole 278mdaily each morning.  ?Discussed switching cetirizine to take as needed for allergies instead of every day. Since there is a lot of pollen currently, recommended she wait 2 to 4 weeks before trying to change to as needed cetirizine.  ?Also sent message to counselor (TArville Limeat patient request to schedule a follow up appointment.  ? ?Subjective: ?Shannon [name] an [age].o. year old female who is a primary patient of BlMosie LukesMD.  The CCM team was consulted for assistance with disease management and care coordination needs.   ? ?Engaged with patient by telephone for follow up visit in response to provider referral for pharmacy case management and/or care coordination services.  ? ?Consent to Services:  ?The patient was given information about Chronic Care Management services, agreed to services, and gave verbal consent prior to initiation of services.  Please see initial visit note for detailed documentation.  ? ?Patient Care Team: ?BlMosie LukesMD as PCP - General (Family Medicine) ?CrLelon PerlaMD as PCP -  Cardiology (Cardiology) ?EvDomingo PulseMD as Consulting Physician (Urology) ?DiCalvert CantorMD as Consulting Physician (Ophthalmology) ?CrLelon PerlaMD as Consulting Physician (Cardiology) ?WaLuretha RuedRN as Case Manager ?EcCherre RobinsRPH-CPP (Pharmacist) ?Saporito, JoMaree ErieLCSW as SoEducation officer, museumLicensed ClHoliday representative?JaPieter PartridgeDO as Consulting Physician (Neurology) ?Bauert, TeNicolasa DuckingLCSW as SoEducation officer, museumPsychology) ? ?Recent Office Visit:  ?03/01/2021 - Fam Med (Dr BlCharlett BlakeFollow up chronic medical conditions. Still endorses anhedonia and increased anxiety despite the increase in venlafaxine. Increased venlafaxine to 22573maily (3 tablets of 64m70m12/14/2022 - Fam Med (Dr BlytCharlett Blakellow up. methocarbamol removed from med list - due to patient not taking.  ? ?Recent Consults: ?05/18/2021 - Cardio (Dr KellClaiborne Billingsen for new sleep evaluation. Patient has CPAP but low adherence noted. Discussed importance of using CPAP and benefits. F/U 1 year.  ?04/21/2021 - TeriMathis Dadounselor ?04/12/2021 - Dermatology (Dr JoneWilhemina Bonito3/13/2023 - Optometry (Dr HuttLaban Emperoren for mechanical ptosis of eyelids, bilaterally ?03/16/2021 - Cardio (Dr CrenStanford BreedU cardiomyopathy. No medication changes noted.  ?02/14/2021 - Neurology (Dr JaffTomi Likensone message - patient requested restart gabapentin - [facility] 300[facility]36m [facility] ?02/03/2021 - Neurology (Dr PatelPosey Prontod cognitive disorder / Alzheimer's disease. Electromyography  ? ?12/29/2020 - Urology (LaroHanley Seamen Follow up interstitial cystitis. Will have a cystoscopy 02/11/2021. Recommended Myrbetriq 25mg 64my. ?12/29/2020 - Cardio (Dr CrenshStanford Breedecked palpitations and HTN. Recoommended patient get Alive[employer] / KardiaJodelle Red  ? ? ?Objective: ? ?Lab Results  ?Component Value Date  ? CREATININE 0.82 10/28/2020  ? CREATININE 0.90 09/21/2020  ? CREATININE 0.90 07/16/2020  ? ? ?  Lab Results  ?Component Value Date  ? HGBA1C 5.6 01/18/2021  ? ?Last  diabetic Eye exam: No results found for: HMDIABEYEEXA  ?Last diabetic Foot exam: No results found for: HMDIABFOOTEX  ? ?   ?Component Value Date/Time  ? CHOL 152 10/28/2020 1214  ? TRIG 207.0 (H) 10/28/2020 1214  ? HDL 52.20 10/28/2020 1214  ? CHOLHDL 3 10/28/2020 1214  ? VLDL 41.4 (H) 10/28/2020 1214  ? [place] 80 06/05/2018 1215  ? LDLDIRECT 78.0 10/28/2020 1214  ? ? ? ?  Latest Ref Rng & Units 01/18/2021  ?  4:39 PM 10/28/2020  ? 12:14 PM 07/05/2020  ? 11:29 AM  ?Hepatic Function  ?Total Protein 6.1 - 8.1 g/dL 6.3   6.3   6.0    ?Albumin 3.5 - 5.2 g/dL  4.0   4.0    ?AST 0 - 37 U/L  21   20    ?ALT 0 - 35 U/L  19   17    ?Alk Phosphatase 39 - 117 U/L  107   105    ?Total Bilirubin 0.2 - 1.2 mg/dL  0.3   0.3    ? ? ?Lab Results  ?Component Value Date/Time  ? TSH 3.11 10/28/2020 12:14 PM  ? TSH 2.61 07/05/2020 11:29 AM  ? ? ? ?  Latest Ref Rng & Units 01/18/2021  ?  4:09 PM 10/28/2020  ? 12:14 PM 09/21/2020  ? 11:16 AM  ?CBC  ?WBC 4.0 - 10.5 K/uL 5.2   7.4   6.7    ?Hemoglobin 12.0 - 15.0 g/dL 11.4   11.4   11.5    ?Hematocrit 36.0 - 46.0 % 34.8   35.8   36.2    ?Platelets 150.0 - 400.0 K/uL 198.0   230.0   266.0    ? ? ?Lab Results  ?Component Value Date/Time  ? VD25OH 51.73 01/18/2021 04:09 PM  ? VD25OH 45.39 10/28/2020 12:14 PM  ? ? ?Clinical ASCVD: Yes  ?The 10-year ASCVD risk score (Arnett DK, et al., 2019) is: 19.8% ?  Values used to calculate the score: ?    Age: 10 years ?    Sex: Female ?    Is Non-Hispanic African American: No ?    Diabetic: No ?    Tobacco smoker: No ?    Systolic Blood Pressure: 676 mmHg ?    Is BP treated: Yes ?    HDL Cholesterol: 52.2 mg/dL ?    Total Cholesterol: 152 mg/dL   ? ?Other:  ?DEXA 09/23/2020 ?AP Spine L3-L4 09/23/2020  Normal -0.8  ?AP Spine L3-L4 01/10/2018 Normal -0.6  ?  ?DualFemur Neck Right 09/23/2020 Osteoporosis -3.0  ?DualFemur Neck Right 01/10/2018 Osteopenia -2.4  ?  ?DualFemur Total Mean 09/23/2020 Osteopenia -2.0  ?DualFemur Total Mean 01/10/2018 Osteopenia -1.5  ?   ?Left Forearm Radius 33% 09/23/2020 76.2 Osteopenia -1.8  ?Left Forearm Radius 33% 01/10/2018 73.5 Osteopenia -1.4  ? ?Social History  ? ?Tobacco Use  ?Smoking Status Never  ? Passive exposure: Never  ?Smokeless Tobacco Never  ? ?BP Readings from Last 3 Encounters:  ?05/18/21 118/62  ?03/16/21 124/72  ?03/01/21 102/60  ? ?Pulse Readings from Last 3 Encounters:  ?05/18/21 69  ?03/16/21 77  ?03/01/21 68  ? ?Wt Readings from Last 3 Encounters:  ?05/18/21 168 lb 12.8 oz (76.6 kg)  ?03/16/21 170 lb (77.1 kg)  ?03/01/21 166 lb 6.4 oz (75.5 kg)  ? ? ?Assessment: Review of patient past medical history, allergies, medications, health  status, including review of consultants reports, laboratory and other test data, was performed as part of comprehensive evaluation and provision of chronic care management services.  ? ?SDOH:  (Social Determinants of Health) assessments and interventions performed:  ? ? ? ?CCM Care Plan ? ?Allergies  ?Allergen Reactions  ? Dilaudid [Hydromorphone Hcl] Itching  ? ? ?Medications Reviewed Today   ? ? Reviewed by Francis Gaines, LCSW (Social Worker) on 05/24/21 at 1132  Med List Status: <None>  ? ?Medication Order Taking? Sig Documenting Provider Last Dose Status Informant  ?acetaminophen (TYLENOL) 500 MG tablet [id number] No Take 1-2 tablets (500-1,000 mg total) by mouth every 8 (eight) hours as needed for moderate pain. Edmisten, Ok Anis, PA Taking Active   ?         ?Med Note Unk Lightning   Tue Dec 14, 2020 11:44 AM)    ?albuterol (VENTOLIN HFA) 108 (90 Base) MCG/ACT inhaler [id number] No INHALE 1-2 PUFFS INTO THE LUNGS EVERY 6 (SIX) HOURS AS NEEDED FOR WHEEZING OR SHORTNESS OF BREATH. Mosie Lukes, MD Taking Active   ?Calcium Carbonate (CALCIUM 600 PO) [id number] No Take 1 tablet by mouth in the morning and at bedtime. [provider] Taking Active Self  ?carvedilol (COREG) 25 MG tablet [id number] No TAKE 1 TABLET TWICE DAILY WITH A MEAL Crenshaw, Denice Bors, MD Taking Active    ?cetirizine (ZYRTEC) 10 MG tablet [id number] No Take 10 mg by mouth daily. [provider] Taking Active Self  ?diltiazem (CARDIZEM CD) 120 MG 24 hr capsule [id number] No TAKE 1 CAPSULE BY MOUTH EVERY

## 2021-05-25 NOTE — Patient Instructions (Addendum)
Shannon [name] ?It was a pleasure speaking with you  ?Below is a summary of your health goals and care plan ? ?Patient Goals/Self-Care Activities ?take medications as prescribed,  ?focus on medication adherence by using weekly medication boxes / reminders,  ?check blood pressure 2 to 3 times per week, document, and provide at future appointments, and  ?Complete application for Elmiron for 2023.  ?Lower dose of famotidine to take '20mg'$  at bedtime for the next 2 to 4 weeks. If no symptoms of reflux (Burning sensation in your chest (heartburn), usually after eating, which might be worse at night; Chest pain; Difficulty swallowing; Regurgitation of food or sour liquid; Sensation of a lump in your throat or cough) then after 2 to 4 weeks can try to stop famotidine.  ?Continue to take omeprazole '20mg'$  daily  ?Can try to take cetirizine once daily when needed for allergy symptoms (would wait 2 to 4 week until pollen count if lower) ?Start regular exercise - walking or use son's home gym. Goal is to start with 10 to 30 minutes per day and work up to 150 minutes per week.  ?I sent a message to Clint Bolder for a counseling appointment. If you have not received a call to schedule and appointment by 06/08/2021, please contact our office at [phone].  ? ? ?If you have any questions or concerns, please feel free to contact me either at the phone number below or with a MyChart message.  ? ?Keep up the good work! ? ?[name], PharmD ?Clinical Pharmacist ?[facility] ?[facility] [place] ?[phone] (direct line)  ?[phone] (main office number) ? ? ?Chronic Care Management Care Plan ? ?Hypertension / Tachycardia ?Uncontrolled - home blood pressure improved since telmisartan added back; blood pressure goal <140/90 ?BP Readings from Last 3 Encounters:  ?05/18/21 118/62  ?03/16/21 124/72  ?03/01/21 102/60  ? ?Current treatment: ?Carvedilol '25mg'$  twice a day (also helps with heart rate) ?Diltiazem '120mg'$  daily (also  helps with heart rate)  ?Telmisartan '40mg'$  daily  ?Interventions:  ?Discussed blood pressure goal and importance of blood pressure control in heart and kidney health.  ?Discussed when was best time to take medications for blood pressure  ?Recommend checking blood pressure 2 to 3 times per week. Record for future visits. ? ?Hyperlipidemia: ?Uncontrolled but close to goal LDL goal <70 and triglycerides < 150 ?Lipid Panel  ?   ?Component Value Date/Time  ? Total Cholesterol  152 10/28/2020 1214  ? Triglycerides 207.0 (H) 10/28/2020 1214  ? HDLC (good cholesterol) 52.20 10/28/2020 1214  ? LDLC (bad cholesterol) 78.0 10/28/2020 1214  ? ?Current treatment: ? Rosuvastatin '20mg'$  daily at bedtime ?Current dietary patterns: not following any particular diet.  ?Interventions:  ?Discussed limiting intake of saturated and trans fat - follow heart health diet.  ?Continue rosuvastatin. ?Start regular exercise - walking or use son's home gym. Goal is to start with 10 to 30 minutes per day and work up to 150 minutes per week.  ? ?Interstitial Cystitis: ?Managed by [facility] - Dr Amalia Hailey and Dr Chauncey Cruel. [name]  ?Currently stable, but notes that cost of Elmiron has recently increased due to being in Medicare Coverage gap ?Current therapy:  ?Elmiron '100mg'$  twice a day ?Hydroxyzine '25mg'$  - take 12.5 to '25mg'$  up to twice a day if needed.  ?Myrbetriq '25mg'$  daily ?Interventions:  ?Patient will completed tax return for 2022 and then complete patient assistance program application for Elmiron. ?Continue current medication and follow up with Dr  Evans.  ?  ?Health Maintenance:  ?Reviewed vaccination history and discussed benefits of Shingrix vaccination ?Patient considering Shingrix in 2023  ? ?Medication management ?Pharmacist Clinical Goal(s): ?Over the next 90 days, patient will work with PharmD and providers to maintain optimal medication adherence ?[facility] ?Interventions ?Comprehensive medication review performed. ?Reviewed refill history and assessed adherence   ?Continue current medication management strategy ? ? ?Follow Up Plan: Telephone follow up appointment with care management team member scheduled for:  1 to 2 months ? ?The patient verbalized understanding of instructions, educational materials, and care plan provided today and agreed to receive a mailed copy of patient instructions, educational materials, and care plan.   ?

## 2021-05-25 NOTE — Addendum Note (Signed)
Addended by: [name] B on: 05/25/2021 02:55 PM ? ? Modules accepted: Orders ? ?

## 2021-05-29 DIAGNOSIS — E782 Mixed hyperlipidemia: Secondary | ICD-10-CM

## 2021-05-29 DIAGNOSIS — M810 Age-related osteoporosis without current pathological fracture: Secondary | ICD-10-CM

## 2021-05-29 DIAGNOSIS — F339 Major depressive disorder, recurrent, unspecified: Secondary | ICD-10-CM

## 2021-05-29 DIAGNOSIS — F418 Other specified anxiety disorders: Secondary | ICD-10-CM

## 2021-05-29 DIAGNOSIS — F0670 Mild neurocognitive disorder due to known physiological condition without behavioral disturbance: Secondary | ICD-10-CM

## 2021-05-29 DIAGNOSIS — I10 Essential (primary) hypertension: Secondary | ICD-10-CM | POA: Diagnosis not present

## 2021-05-29 DIAGNOSIS — G309 Alzheimer's disease, unspecified: Secondary | ICD-10-CM | POA: Diagnosis not present

## 2021-06-07 ENCOUNTER — Ambulatory Visit (INDEPENDENT_AMBULATORY_CARE_PROVIDER_SITE_OTHER): Payer: Medicare Other | Admitting: *Deleted

## 2021-06-07 ENCOUNTER — Encounter: Payer: Self-pay | Admitting: *Deleted

## 2021-06-07 DIAGNOSIS — M810 Age-related osteoporosis without current pathological fracture: Secondary | ICD-10-CM

## 2021-06-07 DIAGNOSIS — D444 Neoplasm of uncertain behavior of craniopharyngeal duct: Secondary | ICD-10-CM

## 2021-06-07 DIAGNOSIS — I10 Essential (primary) hypertension: Secondary | ICD-10-CM

## 2021-06-07 DIAGNOSIS — E669 Obesity, unspecified: Secondary | ICD-10-CM

## 2021-06-07 DIAGNOSIS — F339 Major depressive disorder, recurrent, unspecified: Secondary | ICD-10-CM

## 2021-06-07 DIAGNOSIS — F418 Other specified anxiety disorders: Secondary | ICD-10-CM

## 2021-06-07 DIAGNOSIS — F0670 Mild neurocognitive disorder due to known physiological condition without behavioral disturbance: Secondary | ICD-10-CM

## 2021-06-07 DIAGNOSIS — E782 Mixed hyperlipidemia: Secondary | ICD-10-CM

## 2021-06-07 NOTE — Patient Instructions (Addendum)
Visit Information ? ?Thank you for taking time to visit with me today. Please don't hesitate to contact me if I can be of assistance to you before our next scheduled telephone appointment. ? ?Following are the goals we discussed today:  ?Patient Goals/Self-Care Activities: ?Continue to receive personal counseling with LCSW, on a bi-weekly basis, to reduce and manage symptoms of Anxiety and Depression.   ?Keep scheduled appointments with Clint Bolder, LCSW with [facility] [phone]), in an effort to receive ongoing mental health counseling and supportive services, as well as reduce and manage symptoms of Anxiety and Depression.   ?~ Next appointment with Clint Bolder, LCSW with [facility], scheduled on 06/23/2021 at 2:00 pm. ?Continue to attend monthly seminars, offered by [facility]. ?~ Caregiver Connections Expo, on 06/17/2021 from 10:00 am - 3:00 pm; [facility], [address] [place], [place]. ?Thorough review of article on "Friendship and Dementia: Hints and Tips on Supporting Friends with Dementia", to ensure understanding and answer questions.   ?~ Remember, you are not alone. ?~ For people living with dementia, maintaining meaningful friendships can be a difficult task. A dementia diagnosis can turn someone?s world upside down and it?s at this time that friendship is valued the most, not only to offer comfort and support but to help to maintain an essence of normality. Sadly though, many friendships break down after someone has been diagnosed.   ?~ Two out of three people living with dementia have lost friendships following their diagnosis. ?~ 60% of people living with dementia felt reluctant to attend social situations after their diagnosis. ?~ 91% of participants felt that there was not enough public knowledge of dementia and what it?s like to live with the illness. ?~ These findings show that more must be done to improve  public understanding of attitudes towards dementia, so we are able to help friendships adapt following a diagnosis and throughout the illness. ?LCSW collaboration with Primary Care Physician, Dr. Penni Homans to inquire about whether or not you would be an appropriate candidate for the drug, Lecanemab (Leqembi), a monoclonal antibody infusion given every two weeks. ? ~ The FDA Pharmacologist and Drug Administration) approved Leqembi for use in people with mild cognitive impairment or early Alzheimer?s Disease.  ? ~ Shown in clinical trials to slow cognitive decline in patient's in the early stages of Alzheimer's Disease.   ?LCSW self-enrolled in a 3-part interactive webinar series, "[place]", and will report findings to you after each session: ?~ Part I:  Overview of Burnout, 06/09/2021 at 11:00 am. ?~ Part II:  Managing Burnout Through Boundaries, 06/16/2021 at 11:00 am. ?~ Part III:  Review Strategies to Improve Self-Care, 06/23/2021 at 11:00 am. ?Review article on "Alzheimer?s and Dementia: What?s the Difference" and "FDA Approves New Alzheimer?s Drug that Appears to Slow Progression of the Disease", mailed to you by LCSW on 06/07/2021. ?Contact LCSW directly (# [phone]), if you have questions, need assistance, or if additional social work needs are identified between now and our next scheduled telephone outreach call.  ?Follow-Up:  06/20/2021 at 2:45 pm ? ?Please call the care guide team at [phone] if you need to cancel or reschedule your appointment.  ? ?If you are experiencing a Mental Health or [place] or need someone to talk to, please call the Suicide and Crisis Lifeline: 988 ?call the [place] National Suicide Prevention Lifeline: 872-525-8997 or TTY: 412-453-9637 TTY 3373610838) to talk to a trained  counselor ?call 1-800-273-TALK (toll free, 24 hour hotline) ?go to [facility] [address], [place] [phone]) ?call the  [facility]: [phone] ?call 911  ? ?Patient verbalizes understanding of instructions and care plan provided today and agrees to view in [place]. Active MyChart status confirmed with patient.   ? ?Nat Christen LCSW ?Licensed Clinical Social Worker ?[place] ?[phone]  ?

## 2021-06-07 NOTE — Chronic Care Management (AMB) (Addendum)
Chronic Care Management    Clinical Social Work Note  06/07/2021 Name: Shannon [name] MRN: [id number] DOB: 1945-12-13  Shannon [name] is a 76 y.o. year old female who is a primary care patient of Bradd Canary, MD. The CCM team was consulted to assist the patient with chronic disease management and/or care coordination needs related to: [employer], Mental Health Counseling and Resources, and Grief Counseling.   Engaged with patient by telephone for follow up visit in response to provider referral for social work chronic care management and care coordination services.   Consent to Services:  The patient was given information about Chronic Care Management services, agreed to services, and gave verbal consent prior to initiation of services.  Please see initial visit note for detailed documentation.   Patient agreed to services and consent obtained.   Assessment: Review of patient past medical history, allergies, medications, and health status, including review of relevant consultants reports was performed today as part of a comprehensive evaluation and provision of chronic care management and care coordination services.     SDOH (Social Determinants of Health) assessments and interventions performed:    Advanced Directives Status: Not addressed in this encounter.  CCM Care Plan  Allergies  Allergen Reactions   Dilaudid [Hydromorphone Hcl] Itching    Outpatient Encounter Medications as of 06/07/2021  Medication Sig   acetaminophen (TYLENOL) 500 MG tablet Take 1-2 tablets (500-1,000 mg total) by mouth every 8 (eight) hours as needed for moderate pain.   albuterol (VENTOLIN HFA) 108 (90 Base) MCG/ACT inhaler INHALE 1-2 PUFFS INTO THE LUNGS EVERY 6 (SIX) HOURS AS NEEDED FOR WHEEZING OR SHORTNESS OF BREATH.   Calcium Carbonate (CALCIUM 600 PO) Take 1 tablet by mouth in the morning and at bedtime.   carvedilol (COREG) 25 MG tablet TAKE 1 TABLET TWICE DAILY WITH A MEAL   cetirizine  (ZYRTEC) 10 MG tablet Take 10 mg by mouth daily.   diltiazem (CARDIZEM CD) 120 MG 24 hr capsule TAKE 1 CAPSULE BY MOUTH EVERY DAY   docusate sodium (COLACE) 250 MG capsule Take 250 mg by mouth daily as needed for constipation.   donepezil (ARICEPT) 10 MG tablet TAKE 1 TABLET AT BEDTIME   Ergocalciferol 50 MCG (2000 UT) TABS Take 2,000 Units by mouth daily.   famotidine (PEPCID) 20 MG tablet Take 1 tablet (20 mg total) by mouth at bedtime.   ferrous sulfate 325 (65 FE) MG tablet Take 325 mg by mouth daily with breakfast.   folic acid (FOLVITE) 1 MG tablet Take 1 tablet (1 mg total) by mouth daily.   gabapentin (NEURONTIN) 300 MG capsule Take 1 capsule (300 mg total) by mouth 2 (two) times daily.   hydrOXYzine (ATARAX) 25 MG tablet TAKE 0.5-1 TABLETS (12.5-25 MG TOTAL) BY MOUTH 2 (TWO) TIMES DAILY AS NEEDED.   Melatonin 10 MG CAPS Take 1 capsule by mouth at bedtime.   mirabegron ER (MYRBETRIQ) 25 MG TB24 tablet Take 1 tablet by mouth daily.   Multiple Vitamin (MULTIVITAMIN WITH MINERALS) TABS tablet Take 1 tablet by mouth daily.   omeprazole (PRILOSEC) 20 MG capsule TAKE 1 CAPSULE BY MOUTH EVERY DAY   pentosan polysulfate (ELMIRON) 100 MG capsule Take 100 mg by mouth as directed. two at morning and two at night   rosuvastatin (CRESTOR) 20 MG tablet TAKE 1 TABLET AT BEDTIME   telmisartan (MICARDIS) 40 MG tablet Take 1 tablet (40 mg total) by mouth daily.   venlafaxine XR (EFFEXOR-XR) 75 MG 24 hr  capsule Take 3 capsules (225 mg total) by mouth daily with breakfast.   No facility-administered encounter medications on file as of 06/07/2021.    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   Right knee pain 07/10/2020   Urinary  frequency 02/11/2020   Debility 12/23/2017   Left knee pain 12/23/2017   Vitamin D deficiency 06/14/2017   Vertebral fracture, osteoporotic 04/05/2016   Left-sided back pain 03/06/2016   Chest pain 02/27/2016   Syncope 02/27/2016   Cataracts, bilateral 12/28/2015   Recurrent falls 12/28/2015   Family history- stomach cancer 09/08/2015   Family history of cancer 09/08/2015   Norma Fredrickson lesion 06/21/2015   Obesity 09/28/2014   Interstitial cystitis 09/28/2014   Pain in joint, ankle and foot 09/28/2014   Abnormal nuclear stress test 12/16/2013   Sternal pain 11/06/2013   Postoperative anemia due to acute blood loss 02/13/2013   Congestive dilated cardiomyopathy (HCC) 12/30/2012   Breast cancer of lower-inner quadrant of right female breast 10/21/2012   Constipation 03/03/2012   OA (osteoarthritis) 09/07/2011   Dermatitis    Hyponatremia 08/28/2011   Osteoporosis 03/07/2011   Prediabetes 06/15/2009   Lipoma 02/10/2009   Dyspnea on exertion 02/10/2009   Hyperlipidemia, mixed 08/11/2008   Major depressive disorder with anxious distress 08/11/2008   Essential hypertension 08/11/2008   GERD (gastroesophageal reflux disease) 08/11/2008   Craniopharyngioma 2000    Conditions to be addressed/monitored: Anxiety, Depression, and Dementia.  Limited Social Support, Mental Health Concerns, Social Isolation, Limited Access to Caregiver, Cognitive Deficits, Memory Deficits, and Lacks Knowledge of [facility].  Care Plan : LCSW Plan of Care  Updates made by Karolee Stamps, LCSW since 06/07/2021 12:00 AM     Problem: Reduce and Manage My Symptoms of Anxiety and Depression.   Priority: High     Goal: Reduce and Manage My Symptoms of Anxiety and Depression.   Start Date: 12/30/2020  Expected End Date: 06/30/2021  This Visit's Progress: On track  Recent Progress: On track  Priority: High  Note:   Current Barriers:   Acute Mental Health needs related to Debility, Obesity,  Major Depressive Disorder with Anxious Distress, and Mild Neurocognitive Disorder due to Alzheimer's Disease, requires Support, Education, Resources, Referrals, Advocacy, and Care Coordination, in order to meet unmet mental health needs. Clinical Goal(s):  Patient will work with LCSW to reduce and manage symptoms of Anxiety and Depression, until re-established with a community mental health provider.     Patient will increase knowledge and/or ability of:        Coping Skills, Healthy Habits, Self-Management Skills, Stress Reduction, Home Safety and Utilizing [employer] and Resources.   Interventions: Collaboration with Primary Care Physician, Dr. Danise Edge regarding development and update of comprehensive plan of care, as evidenced by provider attestation and co-signature. Inter-disciplinary care team collaboration (see longitudinal plan of care). Clinical Interventions:  Solution-Focused Therapy Performed. Emotional Support Provided. Verbalization of Feelings Encouraged. Problem Solving Solutions Developed. Client-Centered Therapy Initiated.   Patient Goals/Self-Care Activities: Continue to receive personal counseling with LCSW, on a bi-weekly basis, to reduce and manage symptoms of Anxiety and Depression.   Keep scheduled appointments with Salomon Fick, LCSW with [facility] [phone]), in an effort to receive  ongoing mental health counseling and supportive services, as well as reduce and manage symptoms of Anxiety and Depression.   ~ Next appointment with Salomon Fick, LCSW with [facility], scheduled on 06/23/2021 at 2:00 pm. Continue to attend monthly seminars, offered by [facility]. ~ Caregiver Connections Expo, on 06/17/2021 from 10:00 am - 3:00 pm; [facility], [address] [place], [place]. Thorough review of article on "Friendship and Dementia: Hints and Tips on Supporting Friends with  Dementia", to ensure understanding and answer questions.   ~ Remember, you are not alone. ~ For people living with dementia, maintaining meaningful friendships can be a difficult task. A dementia diagnosis can turn someone's world upside down and it's at this time that friendship is valued the most, not only to offer comfort and support but to help to maintain an essence of normality. Sadly though, many friendships break down after someone has been diagnosed.   ~ Two out of three people living with dementia have lost friendships following their diagnosis. ~ 60% of people living with dementia felt reluctant to attend social situations after their diagnosis. ~ 91% of participants felt that there was not enough public knowledge of dementia and what it's like to live with the illness. ~ These findings show that more must be done to improve public understanding of attitudes towards dementia, so we are able to help friendships adapt following a diagnosis and throughout the illness. LCSW collaboration with Primary Care Physician, Dr. Danise Edge to inquire about whether or not you would be an appropriate candidate for the drug, Lecanemab (Leqembi), a monoclonal antibody infusion given every two weeks.  ~ The FDA Museum/gallery exhibitions officer and Drug Administration) approved Leqembi for use in people with mild cognitive impairment or early Alzheimer's Disease.   ~ Shown in clinical trials to slow cognitive decline in patient's in the early stages of Alzheimer's Disease.   LCSW self-enrolled in a 3-part interactive webinar series, "Burnout & The Cost of Caring", and will report findings to you after each session: ~ Part I:  Overview of Burnout, 06/09/2021 at 11:00 am. ~ Part II:  Managing Burnout Through Boundaries, 06/16/2021 at 11:00 am. ~ Part III:  Review Strategies to Improve Self-Care, 06/23/2021 at 11:00 am. Review article on "Alzheimer's and Dementia: What's the Difference" and "FDA Approves New Alzheimer's Drug that Appears  to Slow Progression of the Disease", mailed to you by LCSW on 06/07/2021. Contact LCSW directly (# [phone]), if you have questions, need assistance, or if additional social work needs are identified between now and our next scheduled telephone outreach call.  Follow-Up:  06/20/2021 at 2:45 pm   Danford Bad LCSW Licensed Clinical Social Worker [facility] [employer] [phone]

## 2021-06-16 ENCOUNTER — Telehealth: Payer: Self-pay | Admitting: Cardiovascular Disease

## 2021-06-16 NOTE — Telephone Encounter (Signed)
She said her supplies were ordered two months ago, but she still hasn't received her supplies.

## 2021-06-17 NOTE — Telephone Encounter (Signed)
Spoke with Threasa Beards with [facility] and she informed me that the spoke with the patient and informed her that the mask has been ordered and they are waiting for the shipment. She uses a [place] [facility], which they do not keep in stock.Patient was offered a different type of mask but she has declined.

## 2021-06-18 ENCOUNTER — Other Ambulatory Visit: Payer: Self-pay | Admitting: Family Medicine

## 2021-06-18 DIAGNOSIS — K2100 Gastro-esophageal reflux disease with esophagitis, without bleeding: Secondary | ICD-10-CM

## 2021-06-20 ENCOUNTER — Telehealth: Payer: Self-pay | Admitting: Family Medicine

## 2021-06-20 ENCOUNTER — Ambulatory Visit: Payer: Medicare Other | Admitting: *Deleted

## 2021-06-20 DIAGNOSIS — F339 Major depressive disorder, recurrent, unspecified: Secondary | ICD-10-CM

## 2021-06-20 DIAGNOSIS — F418 Other specified anxiety disorders: Secondary | ICD-10-CM

## 2021-06-20 DIAGNOSIS — R296 Repeated falls: Secondary | ICD-10-CM

## 2021-06-20 DIAGNOSIS — I10 Essential (primary) hypertension: Secondary | ICD-10-CM

## 2021-06-20 DIAGNOSIS — E782 Mixed hyperlipidemia: Secondary | ICD-10-CM

## 2021-06-20 DIAGNOSIS — F0670 Mild neurocognitive disorder due to known physiological condition without behavioral disturbance: Secondary | ICD-10-CM

## 2021-06-20 DIAGNOSIS — E669 Obesity, unspecified: Secondary | ICD-10-CM

## 2021-06-20 NOTE — Telephone Encounter (Signed)
Pt's social worker called as she is very concerned for pt. She stated pt does have some cognitive issues, but has started hearing some cracking noises when turning her head. This is making the pt and social worker very concerned. She wanted to know if anything could be scheduled sooner than Thursday when she is supposed to see pcp. Scheduled her tomorrow with Lovena Le and kept appt on Thursday with Dr.Blyth as well. Pt will call back if either appt needs to be cancelled, social worker aware of 24 hour cancellation policy.

## 2021-06-20 NOTE — Telephone Encounter (Signed)
Noted. Pt has appt tomorrow with Caleen Jobs.

## 2021-06-21 ENCOUNTER — Telehealth: Payer: Medicare Other

## 2021-06-21 ENCOUNTER — Ambulatory Visit (INDEPENDENT_AMBULATORY_CARE_PROVIDER_SITE_OTHER): Payer: Medicare Other | Admitting: Family Medicine

## 2021-06-21 ENCOUNTER — Encounter: Payer: Self-pay | Admitting: Family Medicine

## 2021-06-21 ENCOUNTER — Ambulatory Visit: Payer: Medicare Other | Admitting: Family Medicine

## 2021-06-21 VITALS — BP 127/54 | HR 64 | Temp 97.8°F | Resp 16 | Wt 169.0 lb

## 2021-06-21 DIAGNOSIS — G309 Alzheimer's disease, unspecified: Secondary | ICD-10-CM | POA: Diagnosis not present

## 2021-06-21 DIAGNOSIS — F0670 Mild neurocognitive disorder due to known physiological condition without behavioral disturbance: Secondary | ICD-10-CM

## 2021-06-21 LAB — POC URINALSYSI DIPSTICK (AUTOMATED)
Bilirubin, UA: NEGATIVE
Blood, UA: NEGATIVE
Glucose, UA: NEGATIVE
Ketones, UA: NEGATIVE
Leukocytes, UA: NEGATIVE
Nitrite, UA: NEGATIVE
Protein, UA: NEGATIVE
Spec Grav, UA: 1.01 (ref 1.010–1.025)
Urobilinogen, UA: 0.2 E.U./dL
pH, UA: 5 (ref 5.0–8.0)

## 2021-06-21 NOTE — Progress Notes (Addendum)
Acute Office Visit  Subjective:     Patient ID: Shannon [name], female    DOB: Aug 19, 1945, 76 y.o.   MRN: [id number]  CC: "creaking" noise/feeling in the back of her head   HPI Patient is in today for cognitive concerns.  Reports social worker called the other day and they seemed concerned about her mental status and reports of the noises in her head. They had her schedule an appointment with [place] to check in.   She has been having a creaking/cracking feeling in the back of her head and it feels like things are falling inside her skull. Reports this has been ongoing for 2-3 weeks, but seems to be slightly worse. She denies any pain, severe headaches, fevers, worsening confusion. She has noticed slightly worsening memory and reports that she does repeat phrases sometimes. States she has occasional headaches at baseline, but no new concerning symptoms or severe headaches. Reports she continues to struggle with her neuropathy.   She is aware of recent neuro work up and dx of mild neurocognitive disorder d/t Alzheimer's   She drove herself here today with no problems Reports she is feeling fine overall, but does feel like she is having a little trouble finding her words sometimes.   She is scheduled to see PCP in 2 days for routine f/u.   MMSE = 29   Review of Systems  Constitutional:  Negative for chills, diaphoresis, fever, malaise/fatigue and weight loss.  HENT: Negative.  Negative for ear discharge, ear pain and hearing loss.   Eyes:  Negative for blurred vision and double vision.  Respiratory:  Negative for shortness of breath.   Cardiovascular:  Negative for chest pain, palpitations and leg swelling.  Gastrointestinal: Negative.   Genitourinary: Negative.   Musculoskeletal: Negative.   Skin: Negative.   Neurological:  Positive for tingling and headaches. Negative for dizziness, seizures, loss of consciousness and weakness.  Psychiatric/Behavioral:  Positive for memory loss.         Objective:    BP (!) 127/54 (BP Location: Left Arm, Patient Position: Sitting, Cuff Size: Small)   Pulse 64   Temp 97.8 F (36.6 C) (Oral)   Resp 16   Wt 169 lb (76.7 kg)   LMP 01/30/1994   SpO2 97%   BMI 30.91 kg/m    Physical Exam Vitals reviewed.  Constitutional:      General: She is not in acute distress.    Appearance: Normal appearance. She is obese. She is not ill-appearing.  HENT:     Head: Normocephalic and atraumatic.  Cardiovascular:     Rate and Rhythm: Normal rate and regular rhythm.  Pulmonary:     Effort: Pulmonary effort is normal.     Breath sounds: Normal breath sounds.  Musculoskeletal:     Cervical back: Normal range of motion and neck supple.     Right lower leg: No edema.     Left lower leg: No edema.  Skin:    General: Skin is warm and dry.  Neurological:     General: No focal deficit present.     Mental Status: She is alert and oriented to person, place, and time. Mental status is at baseline.     Cranial Nerves: No cranial nerve deficit.     Sensory: No sensory deficit.     Motor: No weakness.     Coordination: Coordination normal.     Gait: Gait normal.  Psychiatric:        Mood and  Affect: Mood normal.        Behavior: Behavior normal.        Thought Content: Thought content normal.        Judgment: Judgment normal.    No results found for any visits on 06/21/21.      Assessment & Plan:   1. Mild neurocognitive disorder due to Alzheimer's disease ([place]) Urine was normal. Neuro exam is reassuring.  No changes today. Keep upcoming appointment with Dr. Charlett Blake  Patient aware of signs/symptoms requiring further/urgent evaluation.  - POCT Urinalysis Dipstick (Automated)   Return for - as scheduled with PCP .  Terrilyn Saver, NP

## 2021-06-21 NOTE — Chronic Care Management (AMB) (Signed)
Chronic Care Management    Clinical Social Work Note  06/21/2021 Name: Shannon [name] MRN: [id number] DOB: 05-14-1945  Shannon [name] is a 76 y.o. year old female who is a primary care patient of Mosie Lukes, MD. The CCM team was consulted to assist the patient with chronic disease management and/or care coordination needs related to: Appointment Scheduling Needs, [employer], and Mental Health Counseling and Resources.   Engaged with patient by telephone for follow up visit in response to provider referral for social work chronic care management and care coordination services.   Consent to Services:  The patient was given information about Chronic Care Management services, agreed to services, and gave verbal consent prior to initiation of services.  Please see initial visit note for detailed documentation.   Patient agreed to services and consent obtained.   Assessment: Review of patient past medical history, allergies, medications, and health status, including review of relevant consultants reports was performed today as part of a comprehensive evaluation and provision of chronic care management and care coordination services.     SDOH (Social Determinants of Health) assessments and interventions performed:    Advanced Directives Status: Not addressed in this encounter.  CCM Care Plan  Allergies  Allergen Reactions   Dilaudid [Hydromorphone Hcl] Itching    Outpatient Encounter Medications as of 06/20/2021  Medication Sig   acetaminophen (TYLENOL) 500 MG tablet Take 1-2 tablets (500-1,000 mg total) by mouth every 8 (eight) hours as needed for moderate pain.   albuterol (VENTOLIN HFA) 108 (90 Base) MCG/ACT inhaler INHALE 1-2 PUFFS INTO THE LUNGS EVERY 6 (SIX) HOURS AS NEEDED FOR WHEEZING OR SHORTNESS OF BREATH.   Calcium Carbonate (CALCIUM 600 PO) Take 1 tablet by mouth in the morning and at bedtime.   carvedilol (COREG) 25 MG tablet TAKE 1 TABLET TWICE DAILY WITH A MEAL    cetirizine (ZYRTEC) 10 MG tablet Take 10 mg by mouth daily.   diltiazem (CARDIZEM CD) 120 MG 24 hr capsule TAKE 1 CAPSULE BY MOUTH EVERY DAY   docusate sodium (COLACE) 250 MG capsule Take 250 mg by mouth daily as needed for constipation.   donepezil (ARICEPT) 10 MG tablet TAKE 1 TABLET AT BEDTIME   Ergocalciferol 50 MCG (2000 UT) TABS Take 2,000 Units by mouth daily.   famotidine (PEPCID) 20 MG tablet TAKE 1 TABLET BY MOUTH TWICE A DAY   ferrous sulfate 325 (65 FE) MG tablet Take 325 mg by mouth daily with breakfast.   folic acid (FOLVITE) 1 MG tablet Take 1 tablet (1 mg total) by mouth daily.   gabapentin (NEURONTIN) 300 MG capsule Take 1 capsule (300 mg total) by mouth 2 (two) times daily.   hydrOXYzine (ATARAX) 25 MG tablet TAKE 0.5-1 TABLETS (12.5-25 MG TOTAL) BY MOUTH 2 (TWO) TIMES DAILY AS NEEDED.   Melatonin 10 MG CAPS Take 1 capsule by mouth at bedtime.   mirabegron ER (MYRBETRIQ) 25 MG TB24 tablet Take 1 tablet by mouth daily.   Multiple Vitamin (MULTIVITAMIN WITH MINERALS) TABS tablet Take 1 tablet by mouth daily.   omeprazole (PRILOSEC) 20 MG capsule TAKE 1 CAPSULE BY MOUTH EVERY DAY   pentosan polysulfate (ELMIRON) 100 MG capsule Take 100 mg by mouth as directed. two at morning and two at night   rosuvastatin (CRESTOR) 20 MG tablet TAKE 1 TABLET AT BEDTIME   telmisartan (MICARDIS) 40 MG tablet Take 1 tablet (40 mg total) by mouth daily.   venlafaxine XR (EFFEXOR-XR) 75 MG 24 hr capsule  Take 3 capsules (225 mg total) by mouth daily with breakfast.   No facility-administered encounter medications on file as of 06/20/2021.    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   Right knee pain 07/10/2020   Urinary  frequency 02/11/2020   Debility 12/23/2017   Left knee pain 12/23/2017   Vitamin D deficiency 06/14/2017   Vertebral fracture, osteoporotic 04/05/2016   Left-sided back pain 03/06/2016   Chest pain 02/27/2016   Syncope 02/27/2016   Cataracts, bilateral 12/28/2015   Recurrent falls 12/28/2015   Family history- stomach cancer 09/08/2015   Family history of cancer 09/08/2015   [name] lesion 06/21/2015   Obesity 09/28/2014   Interstitial cystitis 09/28/2014   Pain in joint, ankle and foot 09/28/2014   Abnormal nuclear stress test 12/16/2013   Sternal pain 11/06/2013   Postoperative anemia due to acute blood loss 02/13/2013   Congestive dilated cardiomyopathy ([place]) 12/30/2012   Breast cancer of lower-inner quadrant of right female breast 10/21/2012   Constipation 03/03/2012   OA (osteoarthritis) 09/07/2011   Dermatitis    Hyponatremia 08/28/2011   Osteoporosis 03/07/2011   Prediabetes 06/15/2009   Lipoma 02/10/2009   Dyspnea on exertion 02/10/2009   Hyperlipidemia, mixed 08/11/2008   Major depressive disorder with anxious distress 08/11/2008   Essential hypertension 08/11/2008   GERD (gastroesophageal reflux disease) 08/11/2008   Craniopharyngioma 2000    Conditions to be addressed/monitored: Anxiety, Depression, and Dementia.  Limited Social Support, Mental Health Concerns, Cognitive Deficits, Memory Deficits, and Lacks Knowledge of [employer].  Care Plan : LCSW Plan of Care  Updates made by Francis Gaines, LCSW since 06/21/2021 12:00 AM     Problem: Reduce and Manage My Symptoms of Anxiety and Depression.   Priority: High     Goal: Reduce and Manage My Symptoms of Anxiety and Depression.   Start Date: 12/30/2020  Expected End Date: 07/05/2021  This Visit's Progress: On track  Recent Progress: On track  Priority: High  Note:   Current Barriers:   Acute Mental Health needs related to Debility, Obesity, Major Depressive Disorder with Anxious Distress,  and Mild Neurocognitive Disorder due to Alzheimer's Disease, requires Support, Education, Resources, Referrals, Advocacy, and Care Coordination, in order to meet unmet mental health needs. Clinical Goal(s):  Patient will work with LCSW to reduce and manage symptoms of Anxiety and Depression, until re-established with a community mental health provider.     Patient will increase knowledge and/or ability of:        Coping Skills, Healthy Habits, Self-Management Skills, Stress Reduction, Home Safety and Utilizing [employer] and Resources.   Interventions: Collaboration with Primary Care Physician, Dr. Penni Homans regarding development and update of comprehensive plan of care, as evidenced by provider attestation and co-signature. Inter-disciplinary care team collaboration (see longitudinal plan of care). Clinical Interventions:  Solution-Focused Therapy Performed. Emotional Support Provided. Verbalization of Feelings Encouraged. Problem Solving Solutions Developed. Client-Centered Therapy Initiated.   Patient Goals/Self-Care Activities: Continue to receive personal counseling with LCSW, on a bi-weekly basis, to reduce and manage symptoms of Anxiety and Depression.   Keep scheduled appointments with Clint Bolder, LCSW with [place] [phone]), in an effort to receive ongoing mental health counseling and supportive services,  as well as reduce and manage symptoms of Anxiety and Depression.   ~ Next appointment with Clint Bolder, LCSW with [place], scheduled on 06/23/2021 at 2:00 pm. LCSW collaboration with Primary Care Physician, Dr. Penni Homans to inquire about whether or not you would be an appropriate candidate for the drug, Lecanemab (Leqembi), a monoclonal antibody infusion given every two weeks.  ~ The FDA Pharmacologist and Drug Administration) approved Leqembi for use in people with mild cognitive impairment or early Alzheimer's Disease.   ~ Shown  in clinical trials to slow cognitive decline in patient's in the early stages of Alzheimer's Disease.   Keep scheduled appointments with Primary Care Physician, Dr. Penni Homans with [facility] at [facility]. ~ Next appointment with Dr. Penni Homans, Primary Care Physician, With [facility] at [facility], scheduled on 06/23/2021 at 8:40 am. Attend last session of 3-part interactive webinar series, "Burnout & The Cost of Caring": ~ Part III:  Review Strategies to Improve Self-Care, 06/23/2021 at 11:00 am. Thorough review of article on "Alzheimer's and Dementia: What's the Difference" and "FDA Approves New Alzheimer's Drug that Appears to Slow Progression of the Disease". Collaboration with receptionist at [facility], to schedule earliest available appointment, due to "popping sound in head".  ~ Next appointment with Caleen Jobs, Nurse Practitioner with [facility] at [facility], scheduled on 06/21/2021 at 3:20 pm. Contact LCSW directly (# [phone]), if you have questions, need assistance, or if additional social work needs are identified between now and our next scheduled telephone outreach call.  Follow-Up:  07/05/2021 at 1:30 pm   Nat Christen [place] Clinical Social Worker [place] [phone]

## 2021-06-21 NOTE — Patient Instructions (Signed)
Visit Information  Thank you for taking time to visit with me today. Please don't hesitate to contact me if I can be of assistance to you before our next scheduled telephone appointment.  Following are the goals we discussed today:  Patient Goals/Self-Care Activities: Continue to receive personal counseling with LCSW, on a bi-weekly basis, to reduce and manage symptoms of Anxiety and Depression.   Keep scheduled appointments with Clint Bolder, LCSW with [place] [phone]), in an effort to receive ongoing mental health counseling and supportive services, as well as reduce and manage symptoms of Anxiety and Depression.   ~ Next appointment with Clint Bolder, LCSW with [place], scheduled on 06/23/2021 at 2:00 pm. LCSW collaboration with Primary Care Physician, Dr. Penni Homans to inquire about whether or not you would be an appropriate candidate for the drug, Lecanemab (Leqembi), a monoclonal antibody infusion given every two weeks.             ~ The FDA Pharmacologist and Drug Administration) approved Leqembi for use in people with mild cognitive impairment or early Alzheimer's Disease.              ~ Shown in clinical trials to slow cognitive decline in patient's in the early stages of Alzheimer's Disease.   Keep scheduled appointments with Primary Care Physician, Dr. Penni Homans with [facility] at [facility]. ~ Next appointment with Dr. Penni Homans, Primary Care Physician, With [facility] at [facility], scheduled on 06/23/2021 at 8:40 am. Attend last session of 3-part interactive webinar series, "Burnout & The Cost of Caring": ~ Part III:  Review Strategies to Improve Self-Care, 06/23/2021 at 11:00 am. Thorough review of article on "Alzheimer's and Dementia: What's the Difference" and "FDA Approves New Alzheimer's Drug that Appears to Slow Progression of the Disease". Collaboration with receptionist at [facility], to  schedule earliest available appointment, due to "popping sound in head".             ~ Next appointment with Caleen Jobs, Nurse Practitioner with [facility] at [facility], scheduled on 06/21/2021 at 3:20 pm. Contact LCSW directly (# [phone]), if you have questions, need assistance, or if additional social work needs are identified between now and our next scheduled telephone outreach call.  Follow-Up:  07/05/2021 at 1:30 pm  Please call the care guide team at [phone] if you need to cancel or reschedule your appointment.   If you are experiencing a Mental Health or [place] or need someone to talk to, please call the Suicide and Crisis Lifeline: 988 call the [place] National Suicide Prevention Lifeline: (402)442-9381 or TTY: (860)653-2263 TTY (707) 366-4618) to talk to a trained counselor call 1-800-273-TALK (toll free, 24 hour hotline) go to [facility] [address] [place] [phone]) call the [place]: [phone] call 911   Patient verbalizes understanding of instructions and care plan provided today and agrees to view in [place]. Active MyChart status and patient understanding of how to access instructions and care plan via MyChart confirmed with patient.     [place] Licensed Clinical Social Worker [facility] [employer] [facility] [phone]

## 2021-06-21 NOTE — Patient Instructions (Signed)
Urine was normal. Neuro exam is reassuring.  No changes today. Keep upcoming appointment with Dr. Charlett Blake

## 2021-06-22 NOTE — Progress Notes (Unsigned)
Subjective:    Patient ID: Shannon [name], female    DOB: 02-10-1945, 76 y.o.   MRN: [id number]  No chief complaint on file.   HPI Patient is in today for a follow up.  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 Echo 2012: EF 45-50%;  b. Lex MV 2/12:  low risk, apical defect (small area of ischemia vs shifting breast atten);  c.  Echo 7/12: Normal wall thickness, EF 60-65%, normal wall motion, grade 1 diastolic dysfunction, mild LAE, PASP 32;   d. Lex MV 11/13:  EF 76%, no ischemia   OA (osteoarthritis) 09/07/2011   Obesity 09/28/2014   Osteoporosis 03/07/2011   DEXA T score -2.6 AP  spine 03/07/11    Formatting of this note might be different from the original. Formatting of this note might be different from the original. DEXA T score -2.6 AP spine 03/07/11   Last Assessment & Plan:  Formatting of this note might be different from the original. Encouraged to get adequate exercise, calcium and vitamin d intake   Pain in joint, ankle and foot 09/28/2014   Palpitation 10/28/2020   Personal history of chemotherapy 2012   Personal history of radiation therapy 2012   Prediabetes 06/15/2009   Recurrent falls 12/28/2015   Right knee pain 07/10/2020   Shortness of breath    UTI (urinary tract infection) 03/03/2012   Vertebral fracture, osteoporotic, sequela 04/05/2016   Vitamin D deficiency 06/14/2017   Supplement and monitor    Past Surgical History:  Procedure Laterality Date   BREAST LUMPECTOMY  04/2009   RIGHT FOR BREAST CANCER-CHEMO/RADIATION X 1 YEAR   BREAST REDUCTION SURGERY Bilateral 05/04/2014   Procedure: MAMMARY REDUCTION  (BREAST);  Surgeon: Cristine Polio, MD;  Location: [facility];  Service: Plastics;  Laterality: Bilateral;   CARPAL TUNNEL RELEASE     L wrist, ulnar nerve moved   CHOLECYSTECTOMY     CRANIOTOMY FOR TUMOR  2000   ELBOW SURGERY     left   KNEE ARTHROSCOPY Left 10/12/2014   Procedure: LEFT  KNEE ARTHROSCOPY ;  Surgeon: Gaynelle Arabian, MD;  Location: [facility];  Service: Orthopedics;  Laterality: Left;   KYPHOPLASTY N/A 03/30/2016   Procedure: THORACIC 12 KYPHOPLASTY;  Surgeon: Phylliss Bob, MD;  Location: [facility];  Service: Orthopedics;  Laterality: N/A;  THORACIC 12 KYPHOPLASTY   LEFT HEART CATHETERIZATION WITH CORONARY ANGIOGRAM N/A 12/16/2013   Procedure: LEFT HEART CATHETERIZATION WITH CORONARY ANGIOGRAM;  Surgeon: Peter M [name], MD;  Location: [facility];  Service: Cardiovascular;  Laterality: N/A;   LIPOMA EXCISION  03/28/2009   right leg   MELANOMA EXCISION     PORT-A-CATH REMOVAL  11/30/2010   [place]   porta cath      PORTACATH PLACEMENT  may 2011   REDUCTION MAMMAPLASTY Bilateral    SYNOVECTOMY Left 10/12/2014   Procedure: WITH SYNOVECTOMY;  Surgeon: Gaynelle Arabian, MD;  Location: [facility];  Service: Orthopedics;  Laterality: Left;   TONSILLECTOMY  1958   TOTAL KNEE ARTHROPLASTY  02/05/2012   Procedure: TOTAL KNEE ARTHROPLASTY;  Surgeon: Gearlean Alf, MD;  Location: [facility];  Service: Orthopedics;  Laterality: Right;   TOTAL KNEE ARTHROPLASTY Left 02/10/2013   Procedure: LEFT TOTAL KNEE ARTHROPLASTY;  Surgeon: Gearlean Alf, MD;  Location: [facility];  Service: Orthopedics;  Laterality: Left;   total knee raplacement  01-2012   Right Knee   TOTAL KNEE REVISION Left 07/14/2020   Procedure: TOTAL KNEE REVISION;  Surgeon: Gaynelle Arabian, MD;  Location: [facility];  Service: Orthopedics;  Laterality: Left;   TUBAL LIGATION  1997    Family History  Problem Relation Age of Onset   Breast cancer Mother        sarcoma   Lung cancer Mother    Hypertension Mother    Prostate cancer Father    Congestive Heart Failure Father    Heart attack Father    Prostate cancer Brother    Heart disease Maternal Grandfather        MI   Down syndrome Son    CVA Son    Stomach cancer Maternal Aunt    Dementia Maternal Aunt    Uterine cancer Maternal Aunt    Colon cancer Neg Hx     Social History   Socioeconomic History   Marital status: Married    Spouse name: Eustacia Urbanek   Number of children: 3   Years of education: 12   Highest education level: High school graduate  Occupational History   Occupation: boutique owner-retired    Employer: [employer]  Tobacco Use   Smoking status: Never    Passive exposure: Never   Smokeless tobacco: Never  Vaping Use   Vaping Use: Never used  Substance and Sexual Activity   Alcohol use: No   Drug use: No   Sexual activity: Not Currently  Other Topics Concern   Not on file  Social History Narrative   Patient is right-handed. She lives with her husband in a 4 story home.  They take care of their grown son with down syndrome who has had a stroke. She drinks 1-2 glasses of tea in the evenings. She does not formally exercise.      Social Determinants of Health   Financial Resource Strain: Low Risk    Difficulty of Paying Living Expenses: Not very hard  Food Insecurity: No Food Insecurity   Worried About Running Out of Food in the Last Year: Never true   Ran Out of Food in the Last Year: Never true  Transportation Needs: No  Transportation Needs   Lack of Transportation (Medical): No   Lack of Transportation (Non-Medical): No  Physical Activity: Inactive   Days of Exercise per Week: 0 days   Minutes of Exercise per Session: 0 min  Stress: Stress Concern Present   Feeling of Stress : Very much  Social Connections: Socially Integrated   Frequency of Communication with Friends and Family: More than three times a week   Frequency of Social Gatherings with Friends and Family: More than three times a week   Attends Religious Services: 1 to 4 times per year   Active Member of [employer] or Organizations: Yes   Attends Archivist Meetings: 1 to 4 times per year   Marital Status: Married  Human resources officer Violence: Not At Risk   Fear of Current or Ex-Partner: No   Emotionally Abused: No   Physically Abused: No   Sexually Abused: No    Outpatient Medications Prior to Visit  Medication Sig Dispense Refill   acetaminophen (TYLENOL) 500 MG tablet Take 1-2 tablets (500-1,000 mg total) by mouth every 8 (eight) hours as needed for moderate pain. 100 tablet 2   albuterol (VENTOLIN HFA) 108 (90 Base) MCG/ACT inhaler INHALE 1-2 PUFFS INTO THE LUNGS EVERY 6 (SIX) HOURS AS NEEDED FOR WHEEZING OR SHORTNESS OF BREATH. 18 each 3   Calcium Carbonate (CALCIUM 600 PO) Take 1 tablet by mouth in the morning and at bedtime.     carvedilol (COREG) 25 MG tablet TAKE 1 TABLET TWICE DAILY WITH A MEAL 180 tablet 3   cetirizine (ZYRTEC) 10 MG tablet Take 10 mg by mouth daily.      diltiazem (CARDIZEM CD) 120 MG 24 hr capsule TAKE 1 CAPSULE BY MOUTH EVERY DAY 90 capsule 2   docusate sodium (COLACE) 250 MG capsule Take 250 mg by mouth daily as needed for constipation.     donepezil (ARICEPT) 10 MG tablet TAKE 1 TABLET AT BEDTIME 90 tablet 0   Ergocalciferol 50 MCG (2000 UT) TABS Take 2,000 Units by mouth daily.     famotidine (PEPCID) 20 MG tablet TAKE 1 TABLET BY MOUTH TWICE A DAY 180 tablet 1   ferrous sulfate 325 (65 FE) MG tablet Take 325 mg by mouth daily with breakfast.     folic acid (FOLVITE) 1 MG tablet Take 1 tablet (1 mg total) by mouth daily. 90 tablet 0   gabapentin (NEURONTIN) 300 MG capsule Take 1 capsule (300 mg total) by mouth 2 (two) times daily. 60 capsule 5   hydrOXYzine (ATARAX) 25 MG tablet TAKE 0.5-1 TABLETS (12.5-25 MG TOTAL) BY MOUTH 2 (TWO) TIMES DAILY AS NEEDED. 180 tablet 0   Melatonin 10 MG CAPS Take 1 capsule by mouth at bedtime.     mirabegron ER (MYRBETRIQ) 25 MG TB24 tablet Take 1 tablet by mouth daily.     Multiple Vitamin (MULTIVITAMIN WITH MINERALS) TABS tablet Take 1 tablet by mouth daily.     omeprazole (PRILOSEC) 20 MG capsule TAKE 1 CAPSULE BY MOUTH EVERY DAY 90 capsule 1   pentosan polysulfate (ELMIRON) 100 MG capsule Take 100 mg by mouth as directed. two at morning and two at night     rosuvastatin (CRESTOR) 20 MG tablet TAKE 1 TABLET AT BEDTIME 90 tablet 3   telmisartan (MICARDIS) 40 MG tablet Take 1 tablet (40 mg total) by mouth daily.     venlafaxine XR (EFFEXOR-XR) 75 MG 24 hr capsule Take 3 capsules (225 mg total) by mouth daily with breakfast. 270 capsule  1   No facility-administered medications prior to visit.    Allergies  Allergen Reactions   Dilaudid [Hydromorphone Hcl] Itching    ROS     Objective:    Physical Exam  LMP 01/30/1994  Wt Readings from Last 3 Encounters:  06/21/21 169 lb (76.7 kg)  05/18/21 168 lb 12.8 oz (76.6 kg)  03/16/21 170 lb (77.1 kg)    Diabetic Foot Exam - Simple   No data filed     Lab Results  Component Value Date   WBC 5.2 01/18/2021   HGB 11.4 (L) 01/18/2021   HCT 34.8 (L) 01/18/2021   PLT 198.0 01/18/2021   GLUCOSE 94 10/28/2020   CHOL 152 10/28/2020   TRIG 207.0 (H) 10/28/2020   HDL 52.20 10/28/2020   LDLDIRECT 78.0 10/28/2020   LDLCALC 80 06/05/2018   ALT 19 10/28/2020   AST 21 10/28/2020   NA 144 10/28/2020   K 4.1 10/28/2020   CL 106 10/28/2020   CREATININE 0.82 10/28/2020   BUN 15 10/28/2020   CO2 31 10/28/2020   TSH 3.11 10/28/2020   INR 1.0 07/12/2020   HGBA1C 5.6 01/18/2021    Lab Results  Component Value Date   TSH 3.11 10/28/2020   Lab Results  Component Value Date   WBC 5.2 01/18/2021   HGB 11.4 (L) 01/18/2021   HCT 34.8 (L) 01/18/2021   MCV 88.1 01/18/2021   PLT 198.0 01/18/2021   Lab Results  Component Value Date   NA 144 10/28/2020   K 4.1 10/28/2020   CHLORIDE 107 08/07/2016   CO2 31 10/28/2020   GLUCOSE 94 10/28/2020   BUN 15 10/28/2020   CREATININE 0.82 10/28/2020   BILITOT 0.3 10/28/2020   ALKPHOS 107 10/28/2020   AST 21 10/28/2020   ALT 19 10/28/2020   PROT 6.3 01/18/2021   ALBUMIN 4.0 10/28/2020   CALCIUM 9.4 10/28/2020   ANIONGAP 5 07/16/2020   EGFR 72 (L) 08/07/2016   GFR 69.49 10/28/2020   Lab Results  Component Value Date   CHOL 152 10/28/2020   Lab Results  Component Value Date   HDL 52.20 10/28/2020   Lab Results  Component Value Date   LDLCALC 80 06/05/2018   Lab Results  Component Value Date   TRIG 207.0 (H) 10/28/2020   Lab Results  Component Value Date   CHOLHDL 3 10/28/2020   Lab Results  Component Value Date   HGBA1C 5.6 01/18/2021       Assessment & Plan:   Problem List Items Addressed This Visit   None   I am having Blanch Media D. Angelo maintain her pentosan polysulfate, ferrous sulfate, folic acid, cetirizine, acetaminophen, multivitamin with minerals, Ergocalciferol, rosuvastatin, Melatonin, docusate sodium, Calcium Carbonate (CALCIUM 600 PO), telmisartan, mirabegron  ER, donepezil, carvedilol, gabapentin, albuterol, venlafaxine XR, omeprazole, hydrOXYzine, diltiazem, and famotidine.  No orders of the defined types were placed in this encounter.

## 2021-06-23 ENCOUNTER — Ambulatory Visit (INDEPENDENT_AMBULATORY_CARE_PROVIDER_SITE_OTHER): Payer: Medicare Other | Admitting: Psychology

## 2021-06-23 ENCOUNTER — Encounter: Payer: Self-pay | Admitting: Family Medicine

## 2021-06-23 ENCOUNTER — Ambulatory Visit (INDEPENDENT_AMBULATORY_CARE_PROVIDER_SITE_OTHER): Payer: Medicare Other | Admitting: Family Medicine

## 2021-06-23 VITALS — BP 112/72 | HR 61 | Resp 20 | Ht 62.0 in | Wt 169.4 lb

## 2021-06-23 DIAGNOSIS — E538 Deficiency of other specified B group vitamins: Secondary | ICD-10-CM

## 2021-06-23 DIAGNOSIS — E559 Vitamin D deficiency, unspecified: Secondary | ICD-10-CM

## 2021-06-23 DIAGNOSIS — F0670 Mild neurocognitive disorder due to known physiological condition without behavioral disturbance: Secondary | ICD-10-CM

## 2021-06-23 DIAGNOSIS — F4323 Adjustment disorder with mixed anxiety and depressed mood: Secondary | ICD-10-CM | POA: Diagnosis not present

## 2021-06-23 DIAGNOSIS — F418 Other specified anxiety disorders: Secondary | ICD-10-CM

## 2021-06-23 DIAGNOSIS — I10 Essential (primary) hypertension: Secondary | ICD-10-CM

## 2021-06-23 DIAGNOSIS — E669 Obesity, unspecified: Secondary | ICD-10-CM | POA: Diagnosis not present

## 2021-06-23 DIAGNOSIS — G309 Alzheimer's disease, unspecified: Secondary | ICD-10-CM

## 2021-06-23 DIAGNOSIS — R7303 Prediabetes: Secondary | ICD-10-CM | POA: Diagnosis not present

## 2021-06-23 DIAGNOSIS — F0390 Unspecified dementia without behavioral disturbance: Secondary | ICD-10-CM | POA: Diagnosis not present

## 2021-06-23 DIAGNOSIS — E782 Mixed hyperlipidemia: Secondary | ICD-10-CM | POA: Diagnosis not present

## 2021-06-23 LAB — COMPREHENSIVE METABOLIC PANEL
ALT: 19 U/L (ref 0–35)
AST: 19 U/L (ref 0–37)
Albumin: 4 g/dL (ref 3.5–5.2)
Alkaline Phosphatase: 113 U/L (ref 39–117)
BUN: 17 mg/dL (ref 6–23)
CO2: 31 mEq/L (ref 19–32)
Calcium: 9.5 mg/dL (ref 8.4–10.5)
Chloride: 107 mEq/L (ref 96–112)
Creatinine, Ser: 0.93 mg/dL (ref 0.40–1.20)
GFR: 59.48 mL/min — ABNORMAL LOW (ref >60.00)
Glucose, Bld: 84 mg/dL (ref 70–99)
Potassium: 4 mEq/L (ref 3.5–5.1)
Sodium: 144 mEq/L (ref 135–145)
Total Bilirubin: 0.3 mg/dL (ref 0.2–1.2)
Total Protein: 6.1 g/dL (ref 6.0–8.3)

## 2021-06-23 LAB — HEMOGLOBIN A1C: Hgb A1c MFr Bld: 5.6 % (ref 4.6–6.5)

## 2021-06-23 LAB — CBC
HCT: 36.7 % (ref 36.0–46.0)
Hemoglobin: 12.1 g/dL (ref 12.0–15.0)
MCHC: 33.1 g/dL (ref 30.0–36.0)
MCV: 89.8 fl (ref 78.0–100.0)
Platelets: 231 10*3/uL (ref 150.0–400.0)
RBC: 4.09 Mil/uL (ref 3.87–5.11)
RDW: 13.4 % (ref 11.5–15.5)
WBC: 6.9 10*3/uL (ref 4.0–10.5)

## 2021-06-23 LAB — LIPID PANEL
Cholesterol: 167 mg/dL (ref 0–200)
HDL: 49.9 mg/dL (ref >39.00)
NonHDL: 116.72
Total CHOL/HDL Ratio: 3
Triglycerides: 236 mg/dL — ABNORMAL HIGH (ref 0.0–149.0)
VLDL: 47.2 mg/dL — ABNORMAL HIGH (ref 0.0–40.0)

## 2021-06-23 LAB — TSH: TSH: 3.97 u[IU]/mL (ref 0.35–5.50)

## 2021-06-23 LAB — VITAMIN D 25 HYDROXY (VIT D DEFICIENCY, FRACTURES): VITD: 45.18 ng/mL (ref 30.00–100.00)

## 2021-06-23 LAB — LDL CHOLESTEROL, DIRECT: Direct LDL: 89 mg/dL

## 2021-06-23 LAB — VITAMIN B12: Vitamin B-12: 918 pg/mL — ABNORMAL HIGH (ref 211–911)

## 2021-06-23 NOTE — Assessment & Plan Note (Signed)
Well controlled, no changes to meds. Encouraged heart healthy diet such as the DASH diet and exercise as tolerated.  °

## 2021-06-23 NOTE — Assessment & Plan Note (Signed)
She is working with counselor and neurology but is still frustrated and scared of her diagnosis. Encouraged to try lifestyle changes to help her manage progression ie sleep, hydrate, low inflammation diet like Mediterranean, MVI, fish oil, exercise and learn something new.

## 2021-06-23 NOTE — Progress Notes (Signed)
[facility] Counselor/Therapist Progress Note  Patient ID: Shannon [name], MRN: [id number],    Date: 06/23/2021  Time Spent: 2:00pm-2:50pm    50 minutes   Treatment Type: Individual Therapy  Reported Symptoms: anxiety, stress  Mental Status Exam: Appearance:  Casual     Behavior: Appropriate  Motor: Normal  Speech/Language:  Normal Rate  Affect: Appropriate  Mood: normal  Thought process: normal  Thought content:   WNL  Sensory/Perceptual disturbances:   WNL  Orientation: oriented to person, place, time/date, and situation  Attention: Good  Concentration: Good  Memory: WNL  Fund of knowledge:  Good  Insight:   Good  Judgment:  Good  Impulse Control: Good   Risk Assessment: Danger to Self:  No Self-injurious Behavior: No Danger to Others: No Duty to Warn:no Physical Aggression / Violence:No  Access to Firearms a concern: No  Gang Involvement:No   Subjective:  Pt present for face-to-face individual therapy via video Webex.  Pt consents to telehealth video session due to COVID 19 pandemic. Location of pt: home Location of therapist: home office.   Pt states she is noticing more signs of alzheimers.   She feels her memory has worsened.  This makes pt anxious.   Pt tries to write things down.   Pt is still able to drive and take care of home tasks and business.   Pt talked about worrying about what to do about the big house they live in.   Pt may start looking into senior care residents.   Pt talked about telling some friends that she is in the beginning stages of alzheimers and now they don't have anything to do with her.  Pt talked about how hurtful this is to her.  Pt feels very isolated from people at church now.   Pt feels very alienated.   Helped pt process her feelings.  Pt feels angry that she has alzheimers bc she was looking forward to being able to enjoy her senior years.   Provided supportive therapy.    Interventions: Cognitive Behavioral  Therapy and Insight-Oriented  Diagnosis:  F43.23  Plan: Plan of Care:  Recommend ongoing therapy.   Pt participated in setting treatment goals.   Plan to meet every two weeks.  Treatment Plan (Treatment Plan Target Date: 04/22/2022) Client Abilities/Strengths  Pt is engaging and motivated for therapy.  Client Treatment Preferences  Individual therapy.  Client Statement of Needs  Improve copings skills and have a place to talk about her feelings.   Symptoms  Depressed or irritable mood. Excessive and/or unrealistic worry that is difficult to control occurring more days than not for at least 6 months about a number of events or activities. Hypervigilance (e.g., feeling constantly on edge, experiencing concentration difficulties, having trouble falling or staying asleep, exhibiting a general state of irritability). Low self-esteem. Problems Addressed  Unipolar Depression, Anxiety Goals 1. Alleviate depressive symptoms and return to previous level of effective functioning. 2. Appropriately grieve the loss in order to normalize mood and to return to previously adaptive level of functioning. Objective Learn and implement behavioral strategies to overcome depression. Target Date: 2022-04-22 Frequency: Biweekly  Progress: 10 Modality: individual  Related Interventions Assist the client in developing skills that increase the likelihood of deriving pleasure from behavioral activation (e.g., assertiveness skills, developing an exercise plan, less internal/more external focus, increased social involvement); reinforce success. Engage the client in "behavioral activation," increasing his/her activity level and contact with sources of reward, while identifying processes that inhibit activation.  use behavioral techniques such as instruction, rehearsal, role-playing, role reversal, as needed, to facilitate activity in the client's daily life; reinforce success. 3. Develop healthy interpersonal relationships  that lead to the alleviation and help prevent the relapse of depression. 4. Develop healthy thinking patterns and beliefs about self, others, and the world that lead to the alleviation and help prevent the relapse of depression. 5. Enhance ability to effectively cope with the full variety of life's worries and anxieties. 6. Learn and implement coping skills that result in a reduction of anxiety and worry, and improved daily functioning. Objective Learn and implement problem-solving strategies for realistically addressing worries. Target Date: 2022-04-22 Frequency: Biweekly  Progress: 10 Modality: individual  Related Interventions Assign the client a homework exercise in which he/she problem-solves a current problem (see Mastery of Your Anxiety and Worry: Workbook by Adora Fridge and Eliot Ford or Generalized Anxiety Disorder by Eather Colas, and Eliot Ford); review, reinforce success, and provide corrective feedback toward improvement. Teach the client problem-solving strategies involving specifically defining a problem, generating options for addressing it, evaluating the pros and cons of each option, selecting and implementing an optional action, and reevaluating and refining the action. Objective Learn and implement calming skills to reduce overall anxiety and manage anxiety symptoms. Target Date: 2022-04-22 Frequency: Biweekly  Progress: 10 Modality: individual  Related Interventions Assign the client to read about progressive muscle relaxation and other calming strategies in relevant books or treatment manuals (e.g., Progressive Relaxation Training by Leroy Kennedy; Mastery of Your Anxiety and Worry: Workbook by Beckie Busing). Assign the client homework each session in which he/she practices relaxation exercises daily, gradually applying them progressively from non-anxiety-provoking to anxiety-provoking situations; review and reinforce success while providing corrective feedback toward  improvement. Teach the client calming/relaxation skills (e.g., applied relaxation, progressive muscle relaxation, cue controlled relaxation; mindful breathing; biofeedback) and how to discriminate better between relaxation and tension; teach the client how to apply these skills to his/her daily life. 7. Recognize, accept, and cope with feelings of depression. 8. Reduce overall frequency, intensity, and duration of the anxiety so that daily functioning is not impaired. 9. Resolve the core conflict that is the source of anxiety. 10. Stabilize anxiety level while increasing ability to function on a daily basis. Diagnosis F43.23   Clint Bolder, LCSW

## 2021-06-23 NOTE — Assessment & Plan Note (Signed)
Encouraged DASH or MIND diet, decrease po intake and increase exercise as tolerated. Needs 7-8 hours of sleep nightly. Avoid trans fats, eat small, frequent meals every 4-5 hours with lean proteins, complex carbs and healthy fats. Minimize simple carbs, high fat foods and processed foods 

## 2021-06-23 NOTE — Progress Notes (Signed)
Subjective:   By signing my name below, I, Zite Okoli, attest that this documentation has been prepared under the direction and in the presence of Mosie Lukes, MD. 06/23/2021     Patient ID: Shannon [name], female    DOB: 05/21/1945, 76 y.o.   MRN: [id number]  Chief Complaint  Patient presents with   Follow-up     HPI Patient is in today for an office visit and 3 months f/u. No recent illnesses or ED visits.   She reports she does not think her memory has worsened since her last visit. She has an upcoming neurology appointment.  She is seeing a counselor to help manage her mood.   She has 5 Covid-19 vaccines at this time and is interested in receiving the booster vaccine.    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 Echo 2012: EF 45-50%;  b. Lex MV 2/12:  low risk, apical defect (small area of ischemia vs shifting breast atten);  c.  Echo 7/12: Normal wall thickness, EF 60-65%, normal wall motion, grade 1 diastolic dysfunction, mild LAE, PASP 32;   d. Lex MV 11/13:  EF 76%, no ischemia   OA (osteoarthritis) 09/07/2011   Obesity 09/28/2014   Osteoporosis 03/07/2011   DEXA T score -2.6 AP spine 03/07/11    Formatting of this note might be different from the original. Formatting of this note might be different from the original. DEXA T score -2.6 AP spine 03/07/11   Last Assessment & Plan:  Formatting of this note might be different from the original. Encouraged to get adequate exercise, calcium and vitamin d intake   Pain in joint, ankle and foot 09/28/2014   Palpitation 10/28/2020   Personal history of chemotherapy 2012   Personal history of radiation therapy 2012   Prediabetes 06/15/2009   Recurrent falls 12/28/2015   Right knee pain 07/10/2020   Shortness of breath    UTI (urinary tract infection) 03/03/2012   Vertebral fracture, osteoporotic, sequela 04/05/2016   Vitamin D deficiency 06/14/2017   Supplement and monitor  Past Surgical History:  Procedure Laterality Date   BREAST LUMPECTOMY  04/2009   RIGHT FOR BREAST CANCER-CHEMO/RADIATION X 1 YEAR   BREAST REDUCTION SURGERY Bilateral 05/04/2014   Procedure: MAMMARY REDUCTION  (BREAST);  Surgeon: Cristine Polio, MD;  Location: [facility];  Service: Plastics;  Laterality: Bilateral;   CARPAL TUNNEL RELEASE     L wrist, ulnar nerve moved   CHOLECYSTECTOMY     CRANIOTOMY FOR TUMOR  2000   ELBOW SURGERY     left   KNEE ARTHROSCOPY Left 10/12/2014   Procedure: LEFT KNEE ARTHROSCOPY ;  Surgeon: Gaynelle Arabian, MD;  Location: [facility];  Service: Orthopedics;  Laterality: Left;    KYPHOPLASTY N/A 03/30/2016   Procedure: THORACIC 12 KYPHOPLASTY;  Surgeon: Phylliss Bob, MD;  Location: [facility];  Service: Orthopedics;  Laterality: N/A;  THORACIC 12 KYPHOPLASTY   LEFT HEART CATHETERIZATION WITH CORONARY ANGIOGRAM N/A 12/16/2013   Procedure: LEFT HEART CATHETERIZATION WITH CORONARY ANGIOGRAM;  Surgeon: Peter M [name], MD;  Location: [facility];  Service: Cardiovascular;  Laterality: N/A;   LIPOMA EXCISION  03/28/2009   right leg   MELANOMA EXCISION     PORT-A-CATH REMOVAL  11/30/2010   Streck   porta cath     PORTACATH PLACEMENT  may 2011   REDUCTION MAMMAPLASTY Bilateral    SYNOVECTOMY Left 10/12/2014   Procedure: WITH SYNOVECTOMY;  Surgeon: Gaynelle Arabian, MD;  Location: [facility];  Service: Orthopedics;  Laterality: Left;   TONSILLECTOMY  1958   TOTAL KNEE ARTHROPLASTY  02/05/2012   Procedure: TOTAL KNEE ARTHROPLASTY;  Surgeon: Gearlean Alf, MD;  Location: [facility];  Service: Orthopedics;  Laterality: Right;   TOTAL KNEE ARTHROPLASTY Left 02/10/2013   Procedure: LEFT TOTAL KNEE ARTHROPLASTY;  Surgeon: Gearlean Alf, MD;  Location: [facility];  Service: Orthopedics;  Laterality: Left;   total knee raplacement  01-2012   Right Knee   TOTAL KNEE REVISION Left 07/14/2020   Procedure: TOTAL KNEE REVISION;  Surgeon: Gaynelle Arabian, MD;  Location: [facility];  Service: Orthopedics;  Laterality: Left;   TUBAL LIGATION  1997    Family History  Problem Relation Age of Onset   Breast cancer Mother        sarcoma   Lung cancer Mother    Hypertension Mother    Prostate cancer Father    Congestive Heart Failure Father    Heart attack Father    Prostate cancer Brother    Heart disease Maternal Grandfather        MI   Down syndrome Son    CVA Son    Stomach cancer Maternal Aunt    Dementia Maternal Aunt    Uterine cancer Maternal Aunt    Colon cancer Neg Hx     Social History   Socioeconomic History   Marital status: Married    Spouse name: Toshika Parrow   Number of children: 3    Years of education: 12   Highest education level: High school graduate  Occupational History   Occupation: boutique owner-retired    Employer: [employer]  Tobacco Use   Smoking status: Never    Passive exposure: Never   Smokeless tobacco: Never  Vaping Use   Vaping Use: Never used  Substance and Sexual Activity   Alcohol use: No   Drug use: No   Sexual activity: Not Currently  Other Topics Concern   Not on file  Social History Narrative   Patient is right-handed. She lives with her  husband in a 4 story home. They take care of their grown son with down syndrome who has had a stroke. She drinks 1-2 glasses of tea in the evenings. She does not formally exercise.      Social Determinants of Health   Financial Resource Strain: Low Risk    Difficulty of Paying Living Expenses: Not very hard  Food Insecurity: No Food Insecurity   Worried About Charity fundraiser in the Last Year: Never true   Ran Out of Food in the Last Year: Never true  Transportation Needs: No Transportation Needs   Lack of Transportation (Medical): No   Lack of Transportation (Non-Medical): No  Physical Activity: Inactive   Days of Exercise per Week: 0 days   Minutes of Exercise per Session: 0 min  Stress: Stress Concern Present   Feeling of Stress : Very much  Social Connections: Socially Integrated   Frequency of Communication with Friends and Family: More than three times a week   Frequency of Social Gatherings with Friends and Family: More than three times a week   Attends Religious Services: 1 to 4 times per year   Active Member of [employer] or Organizations: Yes   Attends Archivist Meetings: 1 to 4 times per year   Marital Status: Married  Human resources officer Violence: Not At Risk   Fear of Current or Ex-Partner: No   Emotionally Abused: No   Physically Abused: No   Sexually Abused: No    Outpatient Medications Prior to Visit  Medication Sig Dispense Refill   acetaminophen (TYLENOL)  500 MG tablet Take 1-2 tablets (500-1,000 mg total) by mouth every 8 (eight) hours as needed for moderate pain. 100 tablet 2   albuterol (VENTOLIN HFA) 108 (90 Base) MCG/ACT inhaler INHALE 1-2 PUFFS INTO THE LUNGS EVERY 6 (SIX) HOURS AS NEEDED FOR WHEEZING OR SHORTNESS OF BREATH. 18 each 3   Calcium Carbonate (CALCIUM 600 PO) Take 1 tablet by mouth in the morning and at bedtime.     carvedilol (COREG) 25 MG tablet TAKE 1 TABLET TWICE DAILY WITH A MEAL 180 tablet 3   cetirizine (ZYRTEC) 10 MG tablet Take 10 mg by mouth daily.     diltiazem (CARDIZEM CD) 120 MG 24 hr capsule TAKE 1 CAPSULE BY MOUTH EVERY DAY 90 capsule 2   docusate sodium (COLACE) 250 MG capsule Take 250 mg by mouth daily as needed for constipation.     donepezil (ARICEPT) 10 MG tablet TAKE 1 TABLET AT BEDTIME 90 tablet 0   Ergocalciferol 50 MCG (2000 UT) TABS Take 2,000 Units by mouth daily.     famotidine (PEPCID) 20 MG tablet TAKE 1 TABLET BY MOUTH TWICE A DAY 180 tablet 1   ferrous sulfate 325 (65 FE) MG tablet Take 325 mg by mouth daily with breakfast.     folic acid (FOLVITE) 1 MG tablet Take 1 tablet (1 mg total) by mouth daily. 90 tablet 0   gabapentin (NEURONTIN) 300 MG capsule Take 1 capsule (300 mg total) by mouth 2 (two) times daily. 60 capsule 5   hydrOXYzine (ATARAX) 25 MG tablet TAKE 0.5-1 TABLETS (12.5-25 MG TOTAL) BY MOUTH 2 (TWO) TIMES DAILY AS NEEDED. 180 tablet 0   Melatonin 10 MG CAPS Take 1 capsule by mouth at bedtime.     mirabegron ER (MYRBETRIQ) 25 MG TB24 tablet Take 1 tablet by mouth daily.     Multiple Vitamin (MULTIVITAMIN WITH MINERALS) TABS tablet Take 1 tablet by mouth daily.  omeprazole (PRILOSEC) 20 MG capsule TAKE 1 CAPSULE BY MOUTH EVERY DAY 90 capsule 1   pentosan polysulfate (ELMIRON) 100 MG capsule Take 100 mg by mouth as directed. two at morning and two at night     rosuvastatin (CRESTOR) 20 MG tablet TAKE 1 TABLET AT BEDTIME 90 tablet 3   telmisartan (MICARDIS) 40 MG tablet Take 1 tablet  (40 mg total) by mouth daily.     venlafaxine XR (EFFEXOR-XR) 75 MG 24 hr capsule Take 3 capsules (225 mg total) by mouth daily with breakfast. 270 capsule 1   No facility-administered medications prior to visit.    Allergies  Allergen Reactions   Dilaudid [Hydromorphone Hcl] Itching    Review of Systems  Constitutional:  Negative for fever and malaise/fatigue.  HENT:  Negative for congestion.   Eyes:  Negative for pain and redness.  Respiratory:  Negative for shortness of breath.   Cardiovascular:  Negative for chest pain, palpitations and leg swelling.  Gastrointestinal:  Negative for abdominal pain, blood in stool, diarrhea and nausea.  Genitourinary:  Negative for dysuria and frequency.  Musculoskeletal:  Negative for back pain and falls.  Skin:  Negative for rash.  Neurological:  Negative for dizziness, loss of consciousness and headaches.  Endo/Heme/Allergies:  Negative for polydipsia.  Psychiatric/Behavioral:  Positive for memory loss. Negative for depression. The patient is not nervous/anxious.       Objective:    Physical Exam Constitutional:      General: She is not in acute distress.    Appearance: She is well-developed.  HENT:     Head: Normocephalic and atraumatic.  Eyes:     Conjunctiva/sclera: Conjunctivae normal.  Neck:     Thyroid: No thyromegaly.  Cardiovascular:     Rate and Rhythm: Normal rate and regular rhythm.     Heart sounds: Normal heart sounds. No murmur heard. Pulmonary:     Effort: Pulmonary effort is normal. No respiratory distress.     Breath sounds: Normal breath sounds.  Abdominal:     General: Bowel sounds are normal. There is no distension.     Palpations: Abdomen is soft. There is no mass.     Tenderness: There is no abdominal tenderness.  Musculoskeletal:     Cervical back: Neck supple.  Lymphadenopathy:     Cervical: No cervical adenopathy.  Skin:    General: Skin is warm and dry.  Neurological:     Mental Status: She is  alert and oriented to person, place, and time.  Psychiatric:        Behavior: Behavior normal.    BP 112/72 (BP Location: Left Arm, Patient Position: Sitting, Cuff Size: Normal)   Pulse 61   Resp 20   Ht '5\' 2"'  (1.575 m)   Wt 169 lb 6.4 oz (76.8 kg)   LMP 01/30/1994   SpO2 97%   BMI 30.98 kg/m  Wt Readings from Last 3 Encounters:  06/23/21 169 lb 6.4 oz (76.8 kg)  06/21/21 169 lb (76.7 kg)  05/18/21 168 lb 12.8 oz (76.6 kg)    Diabetic Foot Exam - Simple   No data filed    Lab Results  Component Value Date   WBC 5.2 01/18/2021   HGB 11.4 (L) 01/18/2021   HCT 34.8 (L) 01/18/2021   PLT 198.0 01/18/2021   GLUCOSE 94 10/28/2020   CHOL 152 10/28/2020   TRIG 207.0 (H) 10/28/2020   HDL 52.20 10/28/2020   LDLDIRECT 78.0 10/28/2020   LDLCALC 80 06/05/2018  ALT 19 10/28/2020   AST 21 10/28/2020   NA 144 10/28/2020   K 4.1 10/28/2020   CL 106 10/28/2020   CREATININE 0.82 10/28/2020   BUN 15 10/28/2020   CO2 31 10/28/2020   TSH 3.11 10/28/2020   INR 1.0 07/12/2020   HGBA1C 5.6 01/18/2021    Lab Results  Component Value Date   TSH 3.11 10/28/2020   Lab Results  Component Value Date   WBC 5.2 01/18/2021   HGB 11.4 (L) 01/18/2021   HCT 34.8 (L) 01/18/2021   MCV 88.1 01/18/2021   PLT 198.0 01/18/2021   Lab Results  Component Value Date   NA 144 10/28/2020   K 4.1 10/28/2020   CHLORIDE 107 08/07/2016   CO2 31 10/28/2020   GLUCOSE 94 10/28/2020   BUN 15 10/28/2020   CREATININE 0.82 10/28/2020   BILITOT 0.3 10/28/2020   ALKPHOS 107 10/28/2020   AST 21 10/28/2020   ALT 19 10/28/2020   PROT 6.3 01/18/2021   ALBUMIN 4.0 10/28/2020   CALCIUM 9.4 10/28/2020   ANIONGAP 5 07/16/2020   EGFR 72 (L) 08/07/2016   GFR 69.49 10/28/2020   Lab Results  Component Value Date   CHOL 152 10/28/2020   Lab Results  Component Value Date   HDL 52.20 10/28/2020   Lab Results  Component Value Date   LDLCALC 80 06/05/2018   Lab Results  Component Value Date   TRIG  207.0 (H) 10/28/2020   Lab Results  Component Value Date   CHOLHDL 3 10/28/2020   Lab Results  Component Value Date   HGBA1C 5.6 01/18/2021       Assessment & Plan:   Problem List Items Addressed This Visit     Hyperlipidemia, mixed   Relevant Orders   Lipid panel   Essential hypertension    Well controlled, no changes to meds. Encouraged heart healthy diet such as the DASH diet and exercise as tolerated.        Relevant Orders   TSH   Lipid panel   Comprehensive metabolic panel   CBC   Prediabetes   Relevant Orders   Hemoglobin A1c   Obesity    Encouraged DASH or MIND diet, decrease po intake and increase exercise as tolerated. Needs 7-8 hours of sleep nightly. Avoid trans fats, eat small, frequent meals every 4-5 hours with lean proteins, complex carbs and healthy fats. Minimize simple carbs, high fat foods and processed foods       Vitamin D deficiency   Relevant Orders   VITAMIN D 25 Hydroxy (Vit-D Deficiency, Fractures)   Mild neurocognitive disorder due to Alzheimer's disease    She is working with counselor and neurology but is still frustrated and scared of her diagnosis. Encouraged to try lifestyle changes to help her manage progression ie sleep, hydrate, low inflammation diet like Mediterranean, MVI, fish oil, exercise and learn something new.        Depression with anxiety    Venlafaxine is helping to some extent no changes at this time. Continue counseling.        Dementia ([place]) - Primary   Other Visit Diagnoses     Disorder of vitamin B12       Relevant Orders   Vitamin B12        No orders of the defined types were placed in this encounter.   I,Zite Okoli,acting as a Education administrator for Penni Homans, MD.,have documented all relevant documentation on the behalf of Penni Homans, MD,as directed by  Penni Homans, MD while in the presence of Penni Homans, MD.   I, Mosie Lukes, MD., personally preformed the services described in this documentation.   All medical record entries made by the scribe were at my direction and in my presence.  I have reviewed the chart and discharge instructions (if applicable) and agree that the record reflects my personal performance and is accurate and complete. 06/23/2021

## 2021-06-23 NOTE — Patient Instructions (Addendum)
Hydrate Fish or krill oil Multivitamin with minerals Walk Sleep Learn Mediterranean or DASH diet   Mild Neurocognitive Disorder Mild neurocognitive disorder, formerly known as mild cognitive impairment, is a disorder in which memory does not work as well as it should. This disorder may also cause problems with other mental functions, including thought, communication, behavior, and completion of tasks. These problems can be noticed and measured, but they usually do not interfere with daily activities or the ability to live independently. Mild neurocognitive disorder typically develops after 77 years of age, but it can also develop at younger ages. It is not as serious as major neurocognitive disorder, also known as dementia, but it may be the first sign of it. Generally, symptoms of this condition get worse over time. In rare cases, symptoms can get better. What are the causes? This condition may be caused by: Brain disorders like Alzheimer's disease, Parkinson's disease, and other conditions that gradually damage nerve cells (neurodegenerative conditions). Diseases that affect blood vessels in the brain and result in small strokes. Certain infections, such as HIV. Traumatic brain injury. Other medical conditions, such as brain tumors, underactive thyroid (hypothyroidism), and vitamin B12 deficiency. Use of certain drugs or prescription medicines. What increases the risk? The following factors may make you more likely to develop this condition: Being older than 65 years. Being female. Low education level. Diabetes, high blood pressure, high cholesterol, and other conditions that increase the risk for blood vessel diseases. Untreated or undertreated sleep apnea. Having a certain type of gene that can be passed from parent to child (inherited). Chronic health problems such as heart disease, lung disease, liver disease, kidney disease, or depression. What are the signs or symptoms? Symptoms  of this condition include: Difficulty remembering. You may: Forget names, phone numbers, or details of recent events. Forget social events and appointments. Repeatedly forget where you put your car keys or other items. Difficulty thinking and solving problems. You may have trouble with complex tasks, such as: Paying bills. Driving in unfamiliar places. Difficulty communicating. You may have trouble: Finding the right word or naming an object. Forming a sentence that makes sense, or understanding what you read or hear. Changes in your behavior or personality. When this happens, you may: Lose interest in the things that you used to enjoy. Withdraw from social situations. Get angry more easily than usual. Act before thinking. How is this diagnosed? This condition is diagnosed based on: Your symptoms. Your health care provider may ask you and the people you spend time with, such as family and friends, about your symptoms. Evaluation of mental functions (neuropsychological testing). Your health care provider may refer you to a neurologist or mental health specialist to evaluate your mental functions in detail. To identify the cause of your condition, your health care provider may: Get a detailed medical history. Ask about use of alcohol, drugs, and prescription medicines. Do a physical exam. Order blood tests and brain imaging exams. How is this treated? Mild neurocognitive disorder that is caused by medicine use, drug use, infection, or another medical condition may improve when the cause is treated, or when medicines or drugs are stopped. If this disorder has another cause, it generally does not improve and may get worse. In these cases, the goal of treatment is to help you manage the loss of mental function. Treatments in these cases include: Medicine. Medicine mainly helps memory and behavior symptoms. Talk therapy. Talk therapy provides education, emotional support, memory aids, and other  ways of  making up for problems with mental function. Lifestyle changes, including: Getting regular exercise. Eating a healthy diet that includes omega-3 fatty acids. Challenging your thinking and memory skills. Having more social interaction. Follow these instructions at home: Eating and drinking  Drink enough fluid to keep your urine pale yellow. Eat a healthy diet that includes omega-3 fatty acids. These can be found in: Fish. Nuts. Leafy vegetables. Vegetable oils. If you drink alcohol: Limit how much you use to: 0-1 drink a day for women. 0-2 drinks a day for men. Be aware of how much alcohol is in your drink. In the [place] one drink equals one 12 oz bottle of beer (355 mL), one 5 oz glass of wine (148 mL), or one 1 oz glass of hard liquor (44 mL). Lifestyle  Get regular exercise as told by your health care provider. Do not use any products that contain nicotine or tobacco, such as cigarettes, e-cigarettes, and chewing tobacco. If you need help quitting, ask your health care provider. Practice ways to manage stress. If you need help managing stress, ask your health care provider. Continue to have social interaction. Keep your mind active with stimulating activities you enjoy, such as reading or playing games. Make sure to get quality sleep. Follow these tips: Avoid napping during the day. Keep your sleeping area dark and cool. Avoid exercising during the few hours before you go to bed. Avoid caffeine products in the evening. General instructions Take over-the-counter and prescription medicines only as told by your health care provider. Your health care provider may recommend that you avoid taking medicines that can affect thinking, such as pain medicines or sleep medicines. Work with your health care provider to find out what you need help with and what your safety needs are. Keep all follow-up visits. This is important. Where to find more information [employer] on  Aging: http://kim-miller.com/ Contact a health care provider if: You have any new symptoms. Get help right away if: You develop new confusion or your confusion gets worse. You act in ways that place you or your family in danger. Summary Mild neurocognitive disorder is a disorder in which memory does not work as well as it should. Mild neurocognitive disorder can have many causes. It may be the first stage of dementia. To manage your condition, get regular exercise, keep your mind active, get quality sleep, and eat a healthy diet. This information is not intended to replace advice given to you by your health care provider. Make sure you discuss any questions you have with your health care provider. Document Revised: 06/02/2019 Document Reviewed: 06/02/2019 Elsevier Patient Education  [place].

## 2021-06-23 NOTE — Assessment & Plan Note (Signed)
Venlafaxine is helping to some extent no changes at this time. Continue counseling.

## 2021-06-29 DIAGNOSIS — F0390 Unspecified dementia without behavioral disturbance: Secondary | ICD-10-CM

## 2021-06-29 DIAGNOSIS — F418 Other specified anxiety disorders: Secondary | ICD-10-CM

## 2021-06-30 ENCOUNTER — Telehealth: Payer: Medicare Other

## 2021-06-30 ENCOUNTER — Ambulatory Visit (INDEPENDENT_AMBULATORY_CARE_PROVIDER_SITE_OTHER): Payer: Medicare Other

## 2021-06-30 DIAGNOSIS — F0390 Unspecified dementia without behavioral disturbance: Secondary | ICD-10-CM

## 2021-06-30 DIAGNOSIS — I10 Essential (primary) hypertension: Secondary | ICD-10-CM

## 2021-06-30 DIAGNOSIS — G309 Alzheimer's disease, unspecified: Secondary | ICD-10-CM

## 2021-06-30 NOTE — Chronic Care Management (AMB) (Signed)
Chronic Care Management   CCM RN Visit Note  06/30/2021 Name: Shannon [name] MRN: [id number] DOB: May 27, 1945  Subjective: Shannon [name] is a 76 y.o. year old female who is a primary care patient of Shannon Lukes, MD. The care management team was consulted for assistance with disease management and care coordination needs.    Engaged with patient by telephone for follow up visit in response to provider referral for case management and/or care coordination services.   Consent to Services:  The patient was given information about Chronic Care Management services, agreed to services, and gave verbal consent prior to initiation of services.  Please see initial visit note for detailed documentation.   Patient agreed to services and verbal consent obtained.   Assessment: Review of patient past medical history, allergies, medications, health status, including review of consultants reports, laboratory and other test data, was performed as part of comprehensive evaluation and provision of chronic care management services.   SDOH (Social Determinants of Health) assessments and interventions performed:    CCM Care Plan  Allergies  Allergen Reactions   Dilaudid [Hydromorphone Hcl] Itching    Outpatient Encounter Medications as of 06/30/2021  Medication Sig   acetaminophen (TYLENOL) 500 MG tablet Take 1-2 tablets (500-1,000 mg total) by mouth every 8 (eight) hours as needed for moderate pain.   albuterol (VENTOLIN HFA) 108 (90 Base) MCG/ACT inhaler INHALE 1-2 PUFFS INTO THE LUNGS EVERY 6 (SIX) HOURS AS NEEDED FOR WHEEZING OR SHORTNESS OF BREATH.   Calcium Carbonate (CALCIUM 600 PO) Take 1 tablet by mouth in the morning and at bedtime.   carvedilol (COREG) 25 MG tablet TAKE 1 TABLET TWICE DAILY WITH A MEAL   cetirizine (ZYRTEC) 10 MG tablet Take 10 mg by mouth daily.   diltiazem (CARDIZEM CD) 120 MG 24 hr capsule TAKE 1 CAPSULE BY MOUTH EVERY DAY   docusate sodium (COLACE) 250 MG capsule Take 250 mg  by mouth daily as needed for constipation.   donepezil (ARICEPT) 10 MG tablet TAKE 1 TABLET AT BEDTIME   Ergocalciferol 50 MCG (2000 UT) TABS Take 2,000 Units by mouth daily.   famotidine (PEPCID) 20 MG tablet TAKE 1 TABLET BY MOUTH TWICE A DAY   ferrous sulfate 325 (65 FE) MG tablet Take 325 mg by mouth daily with breakfast.   folic acid (FOLVITE) 1 MG tablet Take 1 tablet (1 mg total) by mouth daily.   gabapentin (NEURONTIN) 300 MG capsule Take 1 capsule (300 mg total) by mouth 2 (two) times daily.   hydrOXYzine (ATARAX) 25 MG tablet TAKE 0.5-1 TABLETS (12.5-25 MG TOTAL) BY MOUTH 2 (TWO) TIMES DAILY AS NEEDED.   Melatonin 10 MG CAPS Take 1 capsule by mouth at bedtime.   mirabegron ER (MYRBETRIQ) 25 MG TB24 tablet Take 1 tablet by mouth daily.   Multiple Vitamin (MULTIVITAMIN WITH MINERALS) TABS tablet Take 1 tablet by mouth daily.   omeprazole (PRILOSEC) 20 MG capsule TAKE 1 CAPSULE BY MOUTH EVERY DAY   pentosan polysulfate (ELMIRON) 100 MG capsule Take 100 mg by mouth as directed. two at morning and two at night   rosuvastatin (CRESTOR) 20 MG tablet TAKE 1 TABLET AT BEDTIME   telmisartan (MICARDIS) 40 MG tablet Take 1 tablet (40 mg total) by mouth daily.   venlafaxine XR (EFFEXOR-XR) 75 MG 24 hr capsule Take 3 capsules (225 mg total) by mouth daily with breakfast.   No facility-administered encounter medications on file as of 06/30/2021.    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   Right knee pain 07/10/2020   Urinary frequency 02/11/2020   Debility 12/23/2017   Left knee pain 12/23/2017   Vitamin D deficiency 06/14/2017   Vertebral fracture, osteoporotic 04/05/2016   Left-sided back pain 03/06/2016   Chest  pain 02/27/2016   Syncope 02/27/2016   Cataracts, bilateral 12/28/2015   Recurrent falls 12/28/2015   Family history- stomach cancer 09/08/2015   Family history of cancer 09/08/2015   [name] lesion 06/21/2015   Obesity 09/28/2014   Interstitial cystitis 09/28/2014   Pain in joint, ankle and foot 09/28/2014   Abnormal nuclear stress test 12/16/2013   Sternal pain 11/06/2013   Postoperative anemia due to acute blood loss 02/13/2013   Congestive dilated cardiomyopathy ([place]) 12/30/2012   Breast cancer of lower-inner quadrant of right female breast 10/21/2012   Constipation 03/03/2012   OA (osteoarthritis) 09/07/2011   Dermatitis    Hyponatremia 08/28/2011   Osteoporosis 03/07/2011   Prediabetes 06/15/2009   Lipoma 02/10/2009   Dyspnea on exertion 02/10/2009   Hyperlipidemia, mixed 08/11/2008   Major depressive disorder with anxious distress 08/11/2008   Essential hypertension 08/11/2008   GERD (gastroesophageal reflux disease) 08/11/2008   Craniopharyngioma 2000    Conditions to be addressed/monitored:HTN, Anxiety, Depression, and Dementia  Care Plan : RN Case Manager Plan of Care  Updates made by Shannon Rued, RN since 06/30/2021 12:00 AM     Problem: No Plan of Care Established for Managment of Chronic Disease (Neurocognitive Disorder, Anxiety/Depression)   Priority: High     Long-Range Goal: Development of Plan of Care for Chronic Disease Management (Neurocognitive Disorder, Anxiety/Depression)   Start Date: 12/13/2020  Expected End Date: 09/15/2021  Priority: High  Note:   Current Barriers:  06/30/21 Shannon [name] continues to work with [employer], Shannon [name] and Shannon [name] counseling and community resources. She continues to be engaged with clinical pharmacist, Shannon [name] regarding medications. She reports she is excited to hear about a new medication that can aid in management of dementia. She reports she is going to discuss with neurologist at next visit  (07/14/21). She continues long term planning in the case she is no longer able to stay in her home. She reports an upcoming visit with Shannon [name] for a tour. She continues to work crossword puzzles and is thinking about what new activity or thing she would like to learn. She also reports plans to participate in [facility]. She denies any questions or concerns at this time. Knowledge Deficits related to plan of care for management of neurocognitive disorder, anxiety/depression  Cognitive Deficits/Memory loss-reports strategies for managing memory loss, keeping notes, writing things down. Medication management-improved. Daughter assist with medication management.   RNCM Clinical Goal(s):  Patient will verbalize understanding of plan for management of Anxiety, Depression, and Dementia as evidenced by taking medications as prescribed, following up with provider appointments as schedule, engaging with LCSW and/or referred specialist. take all medications exactly as prescribed and will call provider for medication related questions as evidenced by medication adherance    work with pharmacist to address medications assistance program if available for [place] and follow up on medication managment related to chronic disease as evidenced by review of EMR and patient or pharmacist report  work with Education officer, museum to address [place] Concerns , Cognitive Deficits, and Memory Deficits related to the management of Anxiety and Depression as evidenced by review of EMR and patient or social worker report     through collaboration with Consulting civil engineer, provider, and care team.   Interventions: 1:1 collaboration with primary care provider regarding development and update of comprehensive plan of care as evidenced by provider attestation and co-signature Inter-disciplinary care team collaboration (see longitudinal plan of care) Evaluation of current treatment plan related to  self management and patient's  adherence to plan as established by provider  Interstitial Cystitis  (Status:  Goal on track:  Yes.)  Long Term Goal  Patient reports condition stable as long as she takes the medication.  Evaluation of current treatment plan related to interstitial cystitis and patient's adherence to plan as established by provider Advised patient to continue to take medications as prescribed   Discussed upcoming/scheduled appointments  Hyperlipidemia Interventions:  (Status:  Goal on track:  Yes.) Long Term Goal Medication review performed; medication list updated in electronic medical record.  Discussed healthy eating and encouraged heart healthy diet such as MIND or DASH, avoid trans fats and simple carbohydrate and processed foods, consider a krill or fish oil capsule daily   Reviewed upcoming/scheduled appointments Encouraged to continue to remain active  Hypertension Interventions:  (Status:  Goal on track:  Yes.) Long Term Goal Last practice recorded BP readings:  BP Readings from Last 3 Encounters:  06/23/21 112/72  06/21/21 (!) 127/54  05/18/21 118/62  Most recent eGFR/CrCl:  Lab Results  Component Value Date   EGFR 72 (L) 08/07/2016    No components found for: CRCL  Encouraged to continue to take medications as scheduled Discussed healthy eating, encouraged to continue to monitor salt intake Upcoming appointments reviewed  Dementia InterventionsGoal on track:  Yes. Long Term Evaluation of current treatment plan related to Dementia, Memory Deficits self-management and patient's adherence to plan as established by provider. Encouraged to continue to be active, encouraged to avoid multitasking and encouraged her to enlist family and friends for large projects such as decluttering her house Reiterated importance of eating a healthy diet Reviewed upcoming appointments  Anxiety/Depression Interventions Goal on track:  Yes. Long term Patient is working with embedded LCSW and Financial controller LCSW  regarding mental health.  Evaluation of current treatment plan related to Anxiety and Depression, Memory Deficits self-management and patient's adherence to plan as established by provider. Encouraged to continue to work with embedded LCSW [employer] and [employer], LCSW. Medications reviewed and encouraged to take as prescribed Encouraged to remain active    Patient Goals/Self-Care Activities: Take medications as prescribed   Attend all scheduled provider appointments Call provider office for new concerns or questions  Continue to Eat Healthy: fruits, vegetables, lean meat, healthy fats; avoid trans fats and simple carbohydrate and processed foods, consider a krill, fish oil or flaxseed capsule daily.  Participate in some type of activity you enjoy every day Continue to work with Care Management Team, RN Case manager, Licensed clinical social worker and Nurse, adult for care management and care coordination needs   Plan:The patient has been provided with contact information for the care management team and has been advised to call with any health related questions or concerns.  The care management team will reach out to the patient again over the next 2-3 months.  Thea Silversmith, RN, MSN, BSN, CCM Care Management Coordinator [phone]

## 2021-06-30 NOTE — Patient Instructions (Addendum)
Visit Information  Thank you for taking time to visit with me today. Please don't hesitate to contact me if I can be of assistance to you before our next scheduled telephone appointment.  Following are the goals we discussed today:  Patient Goals/Self-Care Activities: Take medications as prescribed   Attend all scheduled provider appointments Call provider office for new concerns or questions  Continue to Eat Healthy: fruits, vegetables, lean meat, healthy fats; avoid trans fats and simple carbohydrate and processed foods, consider a krill, fish oil or flaxseed capsule daily.  Participate in some type of activity you enjoy every day Continue to work with Care Management Team, RN Case manager, Licensed clinical social worker and Clinical Pharmacist for care management and care coordination needs  Our next appointment is by telephone on 08/30/21 at 10:45 am  Please call the care guide team at [phone] if you need to cancel or reschedule your appointment.   If you are experiencing a Mental Health or [place] or need someone to talk to, please call the Suicide and Crisis Lifeline: 988 call 1-800-273-TALK (toll free, 24 hour hotline)   Patient verbalizes understanding of instructions and care plan provided today and agrees to view in [place]. Active MyChart status and patient understanding of how to access instructions and care plan via MyChart confirmed with patient.     Thea Silversmith, RN, MSN, BSN, CCM Care Management Coordinator [facility] [phone]

## 2021-07-05 ENCOUNTER — Ambulatory Visit: Payer: Medicare Other | Admitting: *Deleted

## 2021-07-05 DIAGNOSIS — F0390 Unspecified dementia without behavioral disturbance: Secondary | ICD-10-CM

## 2021-07-05 DIAGNOSIS — E782 Mixed hyperlipidemia: Secondary | ICD-10-CM

## 2021-07-05 DIAGNOSIS — F0670 Mild neurocognitive disorder due to known physiological condition without behavioral disturbance: Secondary | ICD-10-CM

## 2021-07-05 DIAGNOSIS — R7303 Prediabetes: Secondary | ICD-10-CM

## 2021-07-05 DIAGNOSIS — F418 Other specified anxiety disorders: Secondary | ICD-10-CM

## 2021-07-05 DIAGNOSIS — I10 Essential (primary) hypertension: Secondary | ICD-10-CM

## 2021-07-05 DIAGNOSIS — F339 Major depressive disorder, recurrent, unspecified: Secondary | ICD-10-CM

## 2021-07-05 NOTE — Patient Instructions (Addendum)
Visit Information  Thank you for taking time to visit with me today. Please don't hesitate to contact me if I can be of assistance to you before our next scheduled telephone appointment.  Following are the goals we discussed today:  Patient Goals/Self-Care Activities: Continue to receive personal counseling with LCSW, on a bi-weekly basis, to reduce and manage symptoms of Anxiety and Depression.   Keep scheduled appointments with Clint Bolder, LCSW with [facility] [phone]), in an effort to receive ongoing mental health counseling and supportive services, as well as reduce and manage symptoms of Anxiety and Depression.   ~ Next appointment with Clint Bolder, LCSW with [facility], scheduled on 07/13/2021 at 2:00 pm. LCSW collaboration with Dr. Penni Homans, Primary Care Physician with [facility], as well as Sharene Butters, PA-C with [facility], to inquire about whether or not you would be an appropriate candidate for the drug, Lecanemab (Leqembi), a monoclonal antibody infusion given every two weeks. ~ The FDA Pharmacologist and Drug Administration) approved Leqembi for use in people with mild cognitive impairment or early Alzheimer's Disease.  ~ Shown in clinical trials to slow cognitive decline in patient's in the early stages of Alzheimer's Disease.   ~ Next appointment with Sharene Butters, PA-C with [facility], scheduled on 07/14/2021 at 3:00 pm. Contact LCSW directly (# [phone]), if you have questions, need assistance, or if additional social work needs are identified between now and our next scheduled telephone outreach call.  Follow-Up:  07/19/2021 at 12:45 pm  Please call the care guide team at [phone] if you need to cancel or reschedule your appointment.   If you are experiencing a Mental Health or [place] or need someone to talk to, please call  the Suicide and Crisis Lifeline: 988 call the [place] National Suicide Prevention Lifeline: 763-737-6390 or TTY: 2290068360 TTY (830)829-7383) to talk to a trained counselor call 1-800-273-TALK (toll free, 24 hour hotline) go to [facility] [address] [place] [phone]) call the [place]: [phone] call 911   Patient verbalizes understanding of instructions and care plan provided today and agrees to view in [place]. Active MyChart status and patient understanding of how to access instructions and care plan via MyChart confirmed with patient.     [place] Licensed Clinical Social Worker [facility] [phone]

## 2021-07-05 NOTE — Chronic Care Management (AMB) (Signed)
Chronic Care Management    Clinical Social Work Note  07/05/2021 Name: Shannon [name] MRN: [id number] DOB: 1945-09-12  Shannon [name] is a 76 y.o. year old female who is a primary care patient of Mosie Lukes, MD. The CCM team was consulted to assist the patient with chronic disease management and/or care coordination needs related to: [employer], Level of Care Concerns, and Mental Health Counseling and Resources.   Engaged with patient by telephone for follow up visit in response to provider referral for social work chronic care management and care coordination services.   Consent to Services:  The patient was given information about Chronic Care Management services, agreed to services, and gave verbal consent prior to initiation of services.  Please see initial visit note for detailed documentation.   Patient agreed to services and consent obtained.   Assessment: Review of patient past medical history, allergies, medications, and health status, including review of relevant consultants reports was performed today as part of a comprehensive evaluation and provision of chronic care management and care coordination services.     SDOH (Social Determinants of Health) assessments and interventions performed:    Advanced Directives Status: Not addressed in this encounter.  CCM Care Plan  Allergies  Allergen Reactions   Dilaudid [Hydromorphone Hcl] Itching    Outpatient Encounter Medications as of 07/05/2021  Medication Sig   acetaminophen (TYLENOL) 500 MG tablet Take 1-2 tablets (500-1,000 mg total) by mouth every 8 (eight) hours as needed for moderate pain.   albuterol (VENTOLIN HFA) 108 (90 Base) MCG/ACT inhaler INHALE 1-2 PUFFS INTO THE LUNGS EVERY 6 (SIX) HOURS AS NEEDED FOR WHEEZING OR SHORTNESS OF BREATH.   Calcium Carbonate (CALCIUM 600 PO) Take 1 tablet by mouth in the morning and at bedtime.   carvedilol (COREG) 25 MG tablet TAKE 1 TABLET TWICE DAILY WITH A MEAL    cetirizine (ZYRTEC) 10 MG tablet Take 10 mg by mouth daily.   diltiazem (CARDIZEM CD) 120 MG 24 hr capsule TAKE 1 CAPSULE BY MOUTH EVERY DAY   docusate sodium (COLACE) 250 MG capsule Take 250 mg by mouth daily as needed for constipation.   donepezil (ARICEPT) 10 MG tablet TAKE 1 TABLET AT BEDTIME   Ergocalciferol 50 MCG (2000 UT) TABS Take 2,000 Units by mouth daily.   famotidine (PEPCID) 20 MG tablet TAKE 1 TABLET BY MOUTH TWICE A DAY   ferrous sulfate 325 (65 FE) MG tablet Take 325 mg by mouth daily with breakfast.   folic acid (FOLVITE) 1 MG tablet Take 1 tablet (1 mg total) by mouth daily.   gabapentin (NEURONTIN) 300 MG capsule Take 1 capsule (300 mg total) by mouth 2 (two) times daily.   hydrOXYzine (ATARAX) 25 MG tablet TAKE 0.5-1 TABLETS (12.5-25 MG TOTAL) BY MOUTH 2 (TWO) TIMES DAILY AS NEEDED.   Melatonin 10 MG CAPS Take 1 capsule by mouth at bedtime.   mirabegron ER (MYRBETRIQ) 25 MG TB24 tablet Take 1 tablet by mouth daily.   Multiple Vitamin (MULTIVITAMIN WITH MINERALS) TABS tablet Take 1 tablet by mouth daily.   omeprazole (PRILOSEC) 20 MG capsule TAKE 1 CAPSULE BY MOUTH EVERY DAY   pentosan polysulfate (ELMIRON) 100 MG capsule Take 100 mg by mouth as directed. two at morning and two at night   rosuvastatin (CRESTOR) 20 MG tablet TAKE 1 TABLET AT BEDTIME   telmisartan (MICARDIS) 40 MG tablet Take 1 tablet (40 mg total) by mouth daily.   venlafaxine XR (EFFEXOR-XR) 75 MG 24 hr  capsule Take 3 capsules (225 mg total) by mouth daily with breakfast.   No facility-administered encounter medications on file as of 07/05/2021.    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   Right knee pain 07/10/2020   Urinary  frequency 02/11/2020   Debility 12/23/2017   Left knee pain 12/23/2017   Vitamin D deficiency 06/14/2017   Vertebral fracture, osteoporotic 04/05/2016   Left-sided back pain 03/06/2016   Chest pain 02/27/2016   Syncope 02/27/2016   Cataracts, bilateral 12/28/2015   Recurrent falls 12/28/2015   Family history- stomach cancer 09/08/2015   Family history of cancer 09/08/2015   [name] lesion 06/21/2015   Obesity 09/28/2014   Interstitial cystitis 09/28/2014   Pain in joint, ankle and foot 09/28/2014   Abnormal nuclear stress test 12/16/2013   Sternal pain 11/06/2013   Postoperative anemia due to acute blood loss 02/13/2013   Congestive dilated cardiomyopathy ([place]) 12/30/2012   Breast cancer of lower-inner quadrant of right female breast 10/21/2012   Constipation 03/03/2012   OA (osteoarthritis) 09/07/2011   Dermatitis    Hyponatremia 08/28/2011   Osteoporosis 03/07/2011   Prediabetes 06/15/2009   Lipoma 02/10/2009   Dyspnea on exertion 02/10/2009   Hyperlipidemia, mixed 08/11/2008   Major depressive disorder with anxious distress 08/11/2008   Essential hypertension 08/11/2008   GERD (gastroesophageal reflux disease) 08/11/2008   Craniopharyngioma 2000    Conditions to be addressed/monitored: Anxiety, Depression, and Dementia.  Limited Social Support, Mental Health Concerns, Limited Access to Caregiver, Cognitive Deficits, Memory Deficits, and Lacks Knowledge of [employer].  Care Plan : LCSW Plan of Care  Updates made by Francis Gaines, LCSW since 07/05/2021 12:00 AM     Problem: Reduce and Manage My Symptoms of Anxiety and Depression.   Priority: High     Long-Range Goal: Reduce and Manage My Symptoms of Anxiety and Depression.   Start Date: 12/30/2020  Expected End Date: 09/30/2021  This Visit's Progress: On track  Recent Progress: On track  Priority: High  Note:   Current Barriers:   Acute Mental Health needs related to Debility, Obesity, Major  Depressive Disorder with Anxious Distress, and Mild Neurocognitive Disorder due to Alzheimer's Disease, requires Support, Education, Resources, Referrals, Advocacy, and Care Coordination, in order to meet unmet mental health needs. Clinical Goal(s):  Patient will work with LCSW to reduce and manage symptoms of Anxiety and Depression, until re-established with a community mental health provider.     Patient will increase knowledge and/or ability of:        Coping Skills, Healthy Habits, Self-Management Skills, Stress Reduction, Home Safety and Utilizing [employer] and Resources.   Interventions: Collaboration with Primary Care Physician, Dr. Penni Homans regarding development and update of comprehensive plan of care, as evidenced by provider attestation and co-signature. Inter-disciplinary care team collaboration (see longitudinal plan of care). Clinical Interventions:  Solution-Focused Therapy Performed. Emotional Support Provided. Verbalization of Feelings Encouraged. Problem Solving Solutions Developed. Client-Centered Therapy Initiated.   Patient Goals/Self-Care Activities: Continue to receive personal counseling with LCSW, on a bi-weekly basis, to reduce and manage symptoms of Anxiety and Depression.   Keep scheduled appointments with Clint Bolder, LCSW with [facility] [phone]), in an effort  to receive ongoing mental health counseling and supportive services, as well as reduce and manage symptoms of Anxiety and Depression.   ~ Next appointment with Clint Bolder, LCSW with [facility], scheduled on 07/13/2021 at 2:00 pm. LCSW collaboration with Dr. Penni Homans, Primary Care Physician with [facility], as well as Sharene Butters, PA-C with [facility], to inquire about whether or not you would be an appropriate candidate for the drug, Lecanemab (Leqembi), a monoclonal  antibody infusion given every two weeks. ~ The FDA Pharmacologist and Drug Administration) approved Leqembi for use in people with mild cognitive impairment or early Alzheimer's Disease.  ~ Shown in clinical trials to slow cognitive decline in patient's in the early stages of Alzheimer's Disease.   ~ Next appointment with Sharene Butters, PA-C with [facility], scheduled on 07/14/2021 at 3:00 pm. Contact LCSW directly (# [phone]), if you have questions, need assistance, or if additional social work needs are identified between now and our next scheduled telephone outreach call.  Follow-Up:  07/19/2021 at 12:45 pm   Nat Christen LCSW Licensed Clinical Social Worker [facility] [phone]

## 2021-07-13 ENCOUNTER — Ambulatory Visit (INDEPENDENT_AMBULATORY_CARE_PROVIDER_SITE_OTHER): Payer: Medicare Other | Admitting: Psychology

## 2021-07-13 DIAGNOSIS — F4323 Adjustment disorder with mixed anxiety and depressed mood: Secondary | ICD-10-CM

## 2021-07-13 NOTE — Progress Notes (Signed)
[facility] Counselor/Therapist Progress Note  Patient ID: Shannon [name], MRN: [id number],    Date: 07/13/2021  Time Spent: 2:00pm-2:55pm    55 minutes   Treatment Type: Individual Therapy  Reported Symptoms: anxiety, stress  Mental Status Exam: Appearance:  Casual     Behavior: Appropriate  Motor: Normal  Speech/Language:  Normal Rate  Affect: Appropriate  Mood: normal  Thought process: normal  Thought content:   WNL  Sensory/Perceptual disturbances:   WNL  Orientation: oriented to person, place, time/date, and situation  Attention: Good  Concentration: Good  Memory: WNL  Fund of knowledge:  Good  Insight:   Good  Judgment:  Good  Impulse Control: Good   Risk Assessment: Danger to Self:  No Self-injurious Behavior: No Danger to Others: No Duty to Warn:no Physical Aggression / Violence:No  Access to Firearms a concern: No  Gang Involvement:No   Subjective:  Pt present for face-to-face individual therapy in person. Pt states she is noticing more signs of cognitive decline.   She feels her memory has worsened.  Pt states she is much more forgetful now.  It has also been hard for her to do her taxes.  Pt is also having neuropathy which concerns her.   This makes pt anxious.   Pt tries to write things down.   Pt is still able to drive and take care of home tasks and business.   Pt states she is excited about the new alzheimers drug that she hopes to be able to take.  She hopes it will slow down the progression of the disease.   Pt states her son and daughter are so busy that they do not offer much support to pt.  Addressed how frustrating this is for pt.   Pt talked about telling some friends that she is in the beginning stages of alzheimers and now they don't have anything to do with her.  Pt talked about how hurtful this is to her.  Pt feels very isolated from people at church now.   Pt feels very alienated and alone. Pt's friends do not call her much  anymore.  Helped pt process her feelings.  Pt feels angry that she has the beginning stages of alzheimers.  She is afraid of what the future holds for her.  Provided supportive therapy.    Interventions: Cognitive Behavioral Therapy and Insight-Oriented  Diagnosis:  F43.23  Plan: Plan of Care:  Recommend ongoing therapy.   Pt participated in setting treatment goals.   Plan to meet every two weeks.  Treatment Plan (Treatment Plan Target Date: 04/22/2022) Client Abilities/Strengths  Pt is engaging and motivated for therapy.  Client Treatment Preferences  Individual therapy.  Client Statement of Needs  Improve copings skills and have a place to talk about her feelings.   Symptoms  Depressed or irritable mood. Excessive and/or unrealistic worry that is difficult to control occurring more days than not for at least 6 months about a number of events or activities. Hypervigilance (e.g., feeling constantly on edge, experiencing concentration difficulties, having trouble falling or staying asleep, exhibiting a general state of irritability). Low self-esteem. Problems Addressed  Unipolar Depression, Anxiety Goals 1. Alleviate depressive symptoms and return to previous level of effective functioning. 2. Appropriately grieve the loss in order to normalize mood and to return to previously adaptive level of functioning. Objective Learn and implement behavioral strategies to overcome depression. Target Date: 2022-04-22 Frequency: Biweekly  Progress: 10 Modality: individual  Related Interventions Assist the client  in developing skills that increase the likelihood of deriving pleasure from behavioral activation (e.g., assertiveness skills, developing an exercise plan, less internal/more external focus, increased social involvement); reinforce success. Engage the client in "behavioral activation," increasing his/her activity level and contact with sources of reward, while identifying processes that inhibit  activation. use behavioral techniques such as instruction, rehearsal, role-playing, role reversal, as needed, to facilitate activity in the client's daily life; reinforce success. 3. Develop healthy interpersonal relationships that lead to the alleviation and help prevent the relapse of depression. 4. Develop healthy thinking patterns and beliefs about self, others, and the world that lead to the alleviation and help prevent the relapse of depression. 5. Enhance ability to effectively cope with the full variety of life's worries and anxieties. 6. Learn and implement coping skills that result in a reduction of anxiety and worry, and improved daily functioning. Objective Learn and implement problem-solving strategies for realistically addressing worries. Target Date: 2022-04-22 Frequency: Biweekly  Progress: 10 Modality: individual  Related Interventions Assign the client a homework exercise in which he/she problem-solves a current problem (see Mastery of Your Anxiety and Worry: Workbook by Adora Fridge and Eliot Ford or Generalized Anxiety Disorder by Eather Colas, and Eliot Ford); review, reinforce success, and provide corrective feedback toward improvement. Teach the client problem-solving strategies involving specifically defining a problem, generating options for addressing it, evaluating the pros and cons of each option, selecting and implementing an optional action, and reevaluating and refining the action. Objective Learn and implement calming skills to reduce overall anxiety and manage anxiety symptoms. Target Date: 2022-04-22 Frequency: Biweekly  Progress: 10 Modality: individual  Related Interventions Assign the client to read about progressive muscle relaxation and other calming strategies in relevant books or treatment manuals (e.g., Progressive Relaxation Training by Leroy Kennedy; Mastery of Your Anxiety and Worry: Workbook by Beckie Busing). Assign the client homework each session in  which he/she practices relaxation exercises daily, gradually applying them progressively from non-anxiety-provoking to anxiety-provoking situations; review and reinforce success while providing corrective feedback toward improvement. Teach the client calming/relaxation skills (e.g., applied relaxation, progressive muscle relaxation, cue controlled relaxation; mindful breathing; biofeedback) and how to discriminate better between relaxation and tension; teach the client how to apply these skills to his/her daily life. 7. Recognize, accept, and cope with feelings of depression. 8. Reduce overall frequency, intensity, and duration of the anxiety so that daily functioning is not impaired. 9. Resolve the core conflict that is the source of anxiety. 10. Stabilize anxiety level while increasing ability to function on a daily basis. Diagnosis F43.23   Clint Bolder, LCSW

## 2021-07-14 ENCOUNTER — Ambulatory Visit (INDEPENDENT_AMBULATORY_CARE_PROVIDER_SITE_OTHER): Payer: Medicare Other | Admitting: Physician Assistant

## 2021-07-14 ENCOUNTER — Encounter: Payer: Self-pay | Admitting: Physician Assistant

## 2021-07-14 VITALS — BP 133/78 | HR 75 | Resp 18 | Ht 62.0 in | Wt 172.0 lb

## 2021-07-14 DIAGNOSIS — F0670 Mild neurocognitive disorder due to known physiological condition without behavioral disturbance: Secondary | ICD-10-CM | POA: Diagnosis not present

## 2021-07-14 DIAGNOSIS — G309 Alzheimer's disease, unspecified: Secondary | ICD-10-CM

## 2021-07-14 NOTE — Patient Instructions (Addendum)
It was a pleasure to see you today at our office.   Recommendations:  Follow up in  6 months Continue donepezil 10 mg daily  Continue behavioral therapy for mood   Whom to call:  Memory  decline, memory medications: Call our office [phone]   For psychiatric meds, mood meds: Please have your primary care physician manage these medications.   Counseling regarding caregiver distress, including caregiver depression, anxiety and issues regarding community resources, adult day care programs, adult living facilities, or memory care questions:   Feel free to contact [place], Social Worker at [phone]   For assessment of decision of mental capacity and competency:  Call Dr. Anthoney Harada, geriatric psychiatrist at [phone]  For guidance in geriatric dementia issues please call [facility] [phone]  For guidance regarding [facility] and if placement were needed at the facility, contact Arnell Asal, Social Worker tel: [phone]  If you have any severe symptoms of a stroke, or other severe issues such as confusion,severe chills or fever, etc call 911 or go to the ER as you may need to be evaluated further   Feel free to visit Facebook page " [employer]" for tips of how to care for people with memory problems.    Feel free to go to the following database for funded clinical studies conducted around the world: http://saunders.com/   https://www.triadclinicaltrials.com/      RECOMMENDATIONS FOR ALL PATIENTS WITH MEMORY PROBLEMS: 1. Continue to exercise (Recommend 30 minutes of walking everyday, or 3 hours every week) 2. Increase social interactions - continue going to [place] and enjoy social gatherings with friends and family 3. Eat healthy, avoid fried foods and eat more fruits and vegetables 4. Maintain adequate blood pressure, blood sugar, and blood cholesterol level. Reducing the risk of stroke and cardiovascular  disease also helps promoting better memory. 5. Avoid stressful situations. Live a simple life and avoid aggravations. Organize your time and prepare for the next day in anticipation. 6. Sleep well, avoid any interruptions of sleep and avoid any distractions in the bedroom that may interfere with adequate sleep quality 7. Avoid sugar, avoid sweets as there is a strong link between excessive sugar intake, diabetes, and cognitive impairment We discussed the Mediterranean diet, which has been shown to help patients reduce the risk of progressive memory disorders and reduces cardiovascular risk. This includes eating fish, eat fruits and green leafy vegetables, nuts like almonds and hazelnuts, walnuts, and also use olive oil. Avoid fast foods and fried foods as much as possible. Avoid sweets and sugar as sugar use has been linked to worsening of memory function.  There is always a concern of gradual progression of memory problems. If this is the case, then we may need to adjust level of care according to patient needs. Support, both to the patient and caregiver, should then be put into place.    FALL PRECAUTIONS: Be cautious when walking. Scan the area for obstacles that may increase the risk of trips and falls. When getting up in the mornings, sit up at the edge of the bed for a few minutes before getting out of bed. Consider elevating the bed at the head end to avoid drop of blood pressure when getting up. Walk always in a well-lit room (use night lights in the walls). Avoid area rugs or power cords from appliances in the middle of the walkways. Use a walker or a cane if necessary and consider physical therapy for balance exercise. Get  your eyesight checked regularly.  FINANCIAL OVERSIGHT: Supervision, especially oversight when making financial decisions or transactions is also recommended.  HOME SAFETY: Consider the safety of the kitchen when operating appliances like stoves, microwave oven, and blender.  Consider having supervision and share cooking responsibilities until no longer able to participate in those. Accidents with firearms and other hazards in the house should be identified and addressed as well.   ABILITY TO BE LEFT ALONE: If patient is unable to contact 911 operator, consider using LifeLine, or when the need is there, arrange for someone to stay with patients. Smoking is a fire hazard, consider supervision or cessation. Risk of wandering should be assessed by caregiver and if detected at any point, supervision and safe proof recommendations should be instituted.  MEDICATION SUPERVISION: Inability to self-administer medication needs to be constantly addressed. Implement a mechanism to ensure safe administration of the medications.   DRIVING: Regarding driving, in patients with progressive memory problems, driving will be impaired. We advise to have someone else do the driving if trouble finding directions or if minor accidents are reported. Independent driving assessment is available to determine safety of driving.   If you are interested in the driving assessment, you can contact the following:  [employer] in [place] [phone]  [place]  [facility] [phone] or [phone]

## 2021-07-14 NOTE — Progress Notes (Signed)
Assessment/Plan:   Mild Cognitive Impairment due to Alzheimer's Disease    Shannon [name] is a very pleasant 76 y.o. RH female  seen today in follow up for memory loss she is on donepezil 10 mg daily, tolerating well.  Today's MMSE was 30/30, clinically stable.  Patient has a history of adjustment disorder which is well management by counseling.    Continue donepezil 10 mg daily  Side effects were discussed Increase activities outside the home, needs to increase the sterilization. Follow up in 6  months.   Case discussed with Dr. Delice Lesch who agrees with the plan     Subjective:  This patient is accompanied in the office by her husband who supplements the history.  Previous records as well as any outside records available were reviewed prior to todays visit.  Patient was last seen at our office on 01/13/2021.  Patient is currently on donepezil 10 mg daily, tolerating well.  Any changes in memory since last visit?  Subjectively, the patient feels that her memory is worse, she cannot remember the names of people, becoming very anxious, but after 20 seconds "it comes back ".  To try to improve her memory, she does crossword puzzles and word finding.  She does not do any activities outside home, because she is concerned about "how other people perceive me, my friends are no longer my friends, they do not want to interact with me because they feel that my dementia is contagious ".   Patient lives with: Spouse of 36 years, who does not feel that her memory is worse. repeats oneself? Occasionally, but her husband reports that this is not worse. Disoriented when walking into a room?  Patient denies   Leaving objects in unusual places?  Patient denies   Ambulates  with difficulty?   Patient denies   Recent falls?  Patient denies   Any head injuries?  Patient denies   History of seizures?   Patient denies   Wandering behavior?  Patient denies   Patient drives?   Short distances  Any mood  changes such irritability agitation?  "A little bit occasionally " Any history of depression?:  Only when she thinks about the friends that do not want to interact with her.  "The ladies will not want to talk to me if they learn I have dementia " Hallucinations?  Patient denies   Paranoia?  Patient denies   Patient reports that he sleeps well without vivid dreams, REM behavior or sleepwalking    History of sleep apnea?  Patient denies   Any hygiene concerns?  Patient denies   Independent of bathing and dressing?  Endorsed  Does the patient needs help with medications?  The patient is in charge.   Who is in charge of the finances?  Patient is in charge   Any changes in appetite?  Patient denies   Patient have trouble swallowing? Patient denies   Does the patient cook?  Patient denies   Any kitchen accidents such as leaving the stove on? Patient denies   Any headaches?  Patient denies   The double vision? Patient denies   Any focal numbness or tingling?  She has a history of some numbness and tingling in the lower extremities, but with recent EMG-NCS completely normal.  Takes gabapentin, with some relief. Chronic back pain Patient denies   Unilateral weakness?  Patient denies   Any tremors?  Patient denies   Any history of anosmia?  Patient denies  Any incontinence of urine?  Patient denies   Any bowel dysfunction?  Patient denies.         Last office visit 2021 had been a difficult year emotionally for her.  She is caring for her husband with dementia.  Her son passed away.  She started having headaches in December 2021.  It followed cataract surgery, so she thought it was due to the eye drops but it persisted after stopping the drops.  They were severe daily headaches that moved around different locations on her head.  She has had some memory problems for a couple of years which has been noticeable to herself and family over the past year.  When talking about recent events, she will  forget, such as where she had lunch or what she did that day.  She will repeat herself 5 to 10 times during the same conversation.  Her daughter started managing her medication because she would sometimes forget to take her medications despite using a pill box.  So far, she has been paying her bills without difficulty.  She drives but denies difficulty or disorientation.  One time, she thought that she was playing hide and seek with her granddaughter who was not there.  On 02/18/2020, she exhibited altered mental status.  She thought her mother who is long passed, was in the house cooking dinner. She went to the ED for further evaluation.  CT head personally reviewed unremarkable.  UA showed possible UTI and was prescribed Keflex.  CXR negative and labs negative for leukocytosis or electrolyte imbalance.  She was also dehydrated.  Followed up with primary care.  MRI of brain without contrast on 03/02/2020 personally reviewed showed mild cerebral atrophy with chronic small vessel ischemic changes and sequelae of prior right pterional craniotomy with no visible tumor recurrence but no acute abnormality.  Headaches now are infrequent, mild and lasting a couple of hours about 2 days a week.  TSH from January was 2.26.     MRI brain 04/2020 No acute intracranial abnormality or significant interval change. 2. Stable mild atrophy and white matter disease. This likely reflects the sequela of chronic microvascular ischemia. 3. No residual or recurrent sellar mass. Right pterional craniotomy noted.    Extensive electrodiagnostic testing of the right lower extremity and additional studies of the left shows:  Right superficial peroneal and left sural sensory responses are absent.  Right sural and left superficial peroneal sensory responses are within normal limits. Bilateral peroneal motor responses show reduced amplitude at the extensor digitorum brevis, and is normal at the tibialis anterior.  Bilateral tibial motor  responses are within normal limits. Bilateral tibial H reflex studies are within normal limits. There is no evidence of active or chronic motor axonal loss changes affecting any of the tested muscles.  Motor unit configuration and recruitment pattern is within normal limits.   PREVIOUS MEDICATIONS:   CURRENT MEDICATIONS:  Outpatient Encounter Medications as of 07/14/2021  Medication Sig   acetaminophen (TYLENOL) 500 MG tablet Take 1-2 tablets (500-1,000 mg total) by mouth every 8 (eight) hours as needed for moderate pain.   albuterol (VENTOLIN HFA) 108 (90 Base) MCG/ACT inhaler INHALE 1-2 PUFFS INTO THE LUNGS EVERY 6 (SIX) HOURS AS NEEDED FOR WHEEZING OR SHORTNESS OF BREATH.   Calcium Carbonate (CALCIUM 600 PO) Take 1 tablet by mouth in the morning and at bedtime.   carvedilol (COREG) 25 MG tablet TAKE 1 TABLET TWICE DAILY WITH A MEAL   cetirizine (ZYRTEC) 10 MG tablet  Take 10 mg by mouth daily.   diltiazem (CARDIZEM CD) 120 MG 24 hr capsule TAKE 1 CAPSULE BY MOUTH EVERY DAY   docusate sodium (COLACE) 250 MG capsule Take 250 mg by mouth daily as needed for constipation.   donepezil (ARICEPT) 10 MG tablet TAKE 1 TABLET AT BEDTIME   Ergocalciferol 50 MCG (2000 UT) TABS Take 2,000 Units by mouth daily.   famotidine (PEPCID) 20 MG tablet TAKE 1 TABLET BY MOUTH TWICE A DAY   ferrous sulfate 325 (65 FE) MG tablet Take 325 mg by mouth daily with breakfast.   folic acid (FOLVITE) 1 MG tablet Take 1 tablet (1 mg total) by mouth daily.   gabapentin (NEURONTIN) 300 MG capsule Take 1 capsule (300 mg total) by mouth 2 (two) times daily.   hydrOXYzine (ATARAX) 25 MG tablet TAKE 0.5-1 TABLETS (12.5-25 MG TOTAL) BY MOUTH 2 (TWO) TIMES DAILY AS NEEDED.   Melatonin 10 MG CAPS Take 1 capsule by mouth at bedtime.   mirabegron ER (MYRBETRIQ) 25 MG TB24 tablet Take 1 tablet by mouth daily.   Multiple Vitamin (MULTIVITAMIN WITH MINERALS) TABS tablet Take 1 tablet by mouth daily.   omeprazole (PRILOSEC) 20 MG  capsule TAKE 1 CAPSULE BY MOUTH EVERY DAY   pentosan polysulfate (ELMIRON) 100 MG capsule Take 100 mg by mouth as directed. two at morning and two at night   rosuvastatin (CRESTOR) 20 MG tablet TAKE 1 TABLET AT BEDTIME   telmisartan (MICARDIS) 40 MG tablet Take 1 tablet (40 mg total) by mouth daily.   venlafaxine XR (EFFEXOR-XR) 75 MG 24 hr capsule Take 3 capsules (225 mg total) by mouth daily with breakfast.   No facility-administered encounter medications on file as of 07/14/2021.        No data to display             No data to display          Objective:     PHYSICAL EXAMINATION:    VITALS:  There were no vitals filed for this visit.  GEN:  The patient appears stated age and is in NAD. HEENT:  Normocephalic, atraumatic.   Neurological examination:  General: NAD, well-groomed, appears stated age. Orientation: The patient is alert. Oriented to person, place and date Cranial nerves: There is good facial symmetry.The speech is fluent and clear. No aphasia or dysarthria. Fund of knowledge is appropriate. Recent and remote memory are impaired. Attention and concentration are reduced.  Able to name objects and repeat phrases.  Hearing is intact to conversational tone.    Sensation: Sensation is intact to light touch throughout Motor: Strength is at least antigravity x4. Tremors: none  DTR's 2/4 in UE/LE     Movement examination: Tone: There is normal tone in the UE/LE Abnormal movements:  no tremor.  No myoclonus.  No asterixis.   Coordination:  There is no decremation with RAM's. Normal finger to nose  Gait and Station: The patient has no difficulty arising out of a deep-seated chair without the use of the hands. The patient's stride length is good.  Gait is cautious and narrow.    Thank you for allowing [place] the opportunity to participate in the care of this nice patient. Please do not hesitate to contact us for any questions or concerns.   Total time spent on today's  visit was 43 minutes dedicated to this patient today, preparing to see patient, examining the patient, ordering tests and/or medications and counseling the patient, documenting clinical information  in the EHR or other health record, independently interpreting results and communicating results to the patient/family, discussing treatment and goals, answering patient's questions and coordinating care.  Cc:  Mosie Lukes, MD  Sharene Butters 07/14/2021 2:30 PM

## 2021-07-19 ENCOUNTER — Ambulatory Visit: Payer: Medicare Other | Admitting: *Deleted

## 2021-07-19 DIAGNOSIS — F339 Major depressive disorder, recurrent, unspecified: Secondary | ICD-10-CM

## 2021-07-19 DIAGNOSIS — F0670 Mild neurocognitive disorder due to known physiological condition without behavioral disturbance: Secondary | ICD-10-CM

## 2021-07-19 DIAGNOSIS — I10 Essential (primary) hypertension: Secondary | ICD-10-CM

## 2021-07-19 DIAGNOSIS — F418 Other specified anxiety disorders: Secondary | ICD-10-CM

## 2021-07-19 DIAGNOSIS — R296 Repeated falls: Secondary | ICD-10-CM

## 2021-07-19 DIAGNOSIS — E782 Mixed hyperlipidemia: Secondary | ICD-10-CM

## 2021-07-19 DIAGNOSIS — F0390 Unspecified dementia without behavioral disturbance: Secondary | ICD-10-CM

## 2021-07-19 NOTE — Chronic Care Management (AMB) (Signed)
Chronic Care Management    Clinical Social Work Note  07/19/2021 Name: Shannon [name] MRN: [id number] DOB: 1945-08-16  Shannon [name] is a 76 y.o. year old female who is a primary care patient of Mosie Lukes, MD. The CCM team was consulted to assist the patient with chronic disease management and/or care coordination needs related to: [employer], Mental Health Counseling and Resources, and Grief Counseling.   Engaged with patient by telephone for follow up visit in response to provider referral for social work chronic care management and care coordination services.   Consent to Services:  The patient was given information about Chronic Care Management services, agreed to services, and gave verbal consent prior to initiation of services.  Please see initial visit note for detailed documentation.   Patient agreed to services and consent obtained.   Assessment: Review of patient past medical history, allergies, medications, and health status, including review of relevant consultants reports was performed today as part of a comprehensive evaluation and provision of chronic care management and care coordination services.     SDOH (Social Determinants of Health) assessments and interventions performed:    Advanced Directives Status: Not addressed in this encounter.  CCM Care Plan  Allergies  Allergen Reactions   Dilaudid [Hydromorphone Hcl] Itching    Outpatient Encounter Medications as of 07/19/2021  Medication Sig   albuterol (VENTOLIN HFA) 108 (90 Base) MCG/ACT inhaler INHALE 1-2 PUFFS INTO THE LUNGS EVERY 6 (SIX) HOURS AS NEEDED FOR WHEEZING OR SHORTNESS OF BREATH.   Calcium Carbonate (CALCIUM 600 PO) Take 1 tablet by mouth in the morning and at bedtime.   carvedilol (COREG) 25 MG tablet TAKE 1 TABLET TWICE DAILY WITH A MEAL   cetirizine (ZYRTEC) 10 MG tablet Take 10 mg by mouth daily.   diltiazem (CARDIZEM CD) 120 MG 24 hr capsule TAKE 1 CAPSULE BY MOUTH EVERY DAY    docusate sodium (COLACE) 250 MG capsule Take 250 mg by mouth daily as needed for constipation.   donepezil (ARICEPT) 10 MG tablet TAKE 1 TABLET AT BEDTIME   Ergocalciferol 50 MCG (2000 UT) TABS Take 2,000 Units by mouth daily.   famotidine (PEPCID) 20 MG tablet TAKE 1 TABLET BY MOUTH TWICE A DAY   ferrous sulfate 325 (65 FE) MG tablet Take 325 mg by mouth daily with breakfast.   folic acid (FOLVITE) 1 MG tablet Take 1 tablet (1 mg total) by mouth daily.   gabapentin (NEURONTIN) 300 MG capsule Take 1 capsule (300 mg total) by mouth 2 (two) times daily.   hydrOXYzine (ATARAX) 25 MG tablet TAKE 0.5-1 TABLETS (12.5-25 MG TOTAL) BY MOUTH 2 (TWO) TIMES DAILY AS NEEDED.   Melatonin 10 MG CAPS Take 1 capsule by mouth at bedtime.   mirabegron ER (MYRBETRIQ) 25 MG TB24 tablet Take 1 tablet by mouth daily.   Multiple Vitamin (MULTIVITAMIN WITH MINERALS) TABS tablet Take 1 tablet by mouth daily.   omeprazole (PRILOSEC) 20 MG capsule TAKE 1 CAPSULE BY MOUTH EVERY DAY   pentosan polysulfate (ELMIRON) 100 MG capsule Take 100 mg by mouth as directed. two at morning and two at night   rosuvastatin (CRESTOR) 20 MG tablet TAKE 1 TABLET AT BEDTIME   telmisartan (MICARDIS) 40 MG tablet Take 1 tablet (40 mg total) by mouth daily.   venlafaxine XR (EFFEXOR-XR) 75 MG 24 hr capsule Take 3 capsules (225 mg total) by mouth daily with breakfast.   No facility-administered encounter medications on file as of 07/19/2021.  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   Right knee pain 07/10/2020   Urinary frequency 02/11/2020   Debility 12/23/2017   Left knee pain 12/23/2017   Vitamin D deficiency 06/14/2017   Vertebral fracture, osteoporotic  04/05/2016   Left-sided back pain 03/06/2016   Chest pain 02/27/2016   Syncope 02/27/2016   Cataracts, bilateral 12/28/2015   Recurrent falls 12/28/2015   Family history- stomach cancer 09/08/2015   Family history of cancer 09/08/2015   [name] lesion 06/21/2015   Obesity 09/28/2014   Interstitial cystitis 09/28/2014   Pain in joint, ankle and foot 09/28/2014   Abnormal nuclear stress test 12/16/2013   Sternal pain 11/06/2013   Postoperative anemia due to acute blood loss 02/13/2013   Congestive dilated cardiomyopathy ([place]) 12/30/2012   Breast cancer of lower-inner quadrant of right female breast 10/21/2012   Constipation 03/03/2012   OA (osteoarthritis) 09/07/2011   Dermatitis    Hyponatremia 08/28/2011   Osteoporosis 03/07/2011   Prediabetes 06/15/2009   Lipoma 02/10/2009   Dyspnea on exertion 02/10/2009   Hyperlipidemia, mixed 08/11/2008   Major depressive disorder with anxious distress 08/11/2008   Essential hypertension 08/11/2008   GERD (gastroesophageal reflux disease) 08/11/2008   Craniopharyngioma 2000    Conditions to be addressed/monitored: Anxiety, Depression, and Dementia.  Limited Social Support, Mental Health Concerns, Social Isolation, Limited Access to Caregiver, Cognitive Deficits, Memory Deficits, and Lacks Knowledge of [employer].  Care Plan : LCSW Plan of Care  Updates made by Francis Gaines, LCSW since 07/19/2021 12:00 AM     Problem: Reduce and Manage My Symptoms of Anxiety and Depression.   Priority: High     Long-Range Goal: Reduce and Manage My Symptoms of Anxiety and Depression.   Start Date: 12/30/2020  Expected End Date: 09/30/2021  This Visit's Progress: On track  Recent Progress: On track  Priority: High  Note:   Current Barriers:   Acute Mental Health needs related to Debility, Obesity, Major Depressive Disorder with Anxious Distress, and Mild Neurocognitive Disorder due to Alzheimer's Disease, requires Support,  Education, Resources, Referrals, Advocacy, and Care Coordination, in order to meet unmet mental health needs. Clinical Goal(s):  Patient will work with LCSW to reduce and manage symptoms of Anxiety and Depression, until re-established with a community mental health provider.     Patient will increase knowledge and/or ability of:        Coping Skills, Healthy Habits, Self-Management Skills, Stress Reduction, Home Safety and Utilizing [employer] and Resources.   Interventions: Collaboration with Primary Care Physician, Dr. Penni Homans regarding development and update of comprehensive plan of care, as evidenced by provider attestation and co-signature. Inter-disciplinary care team collaboration (see longitudinal plan of care). Clinical Interventions:  Solution-Focused Therapy Performed. Emotional Support Provided. Verbalization of Feelings Encouraged. Problem Solving Solutions Developed. Client-Centered Therapy Initiated.   Patient Goals/Self-Care Activities: Continue to receive personal counseling with LCSW, on a bi-weekly basis, to reduce and manage symptoms of Anxiety and Depression.   Keep scheduled appointments with Clint Bolder, LCSW with [facility] [phone]), in an effort to receive ongoing mental health counseling and supportive services, as well as reduce and manage symptoms of Anxiety and Depression.   LCSW collaboration  with Sharene Butters, PA-C with [facility], to inquire about whether or not you would be an appropriate candidate for the drug, Lecanemab (Leqembi), a monoclonal antibody infusion given every two weeks. ~ According to Sharene Butters, PA-C, [facility] does not perform research studies, nor are they able to deem candidacy for this drug, but provided a list of funded clinical studies, which include the following:   http://saunders.com/   https://www.triadclinicaltrials.com/     Please go to the website of NIH ([place], as they provide a list of public and privately funded clinical studies conducted around the world.  Continue to take Donepezil 10 mg, po, daily, for treatment of Mild Neurocognitive Disorder due to Alzheimer's Disease/Dementia. If you are concerned about your ability to drive now that you have been diagnosed with Mild Neurocognitive Disorder due to Alzheimer's Disease/Dementia, you can schedule a driving assessment at any of the following locations:  ~ [employer] (# [phone])  ~ Social research officer, government (# [phone])  ~ [facility] (# [phone])  ~ [place] [phone]) Contact LCSW directly [phone]), if you have questions, need assistance, or if additional social work needs are identified between now and our next scheduled telephone outreach call.  Follow-Up:  08/04/2021 at 10:45 am   [place] Social Worker [place] [phone]

## 2021-07-19 NOTE — Patient Instructions (Addendum)
Visit Information  Thank you for taking time to visit with me today. Please don't hesitate to contact me if I can be of assistance to you before our next scheduled telephone appointment.  Following are the goals we discussed today:  Patient Goals/Self-Care Activities: Continue to receive personal counseling with LCSW, on a bi-weekly basis, to reduce and manage symptoms of Anxiety and Depression.   Keep scheduled appointments with Clint Bolder, LCSW with [facility] [phone]), in an effort to receive ongoing mental health counseling and supportive services, as well as reduce and manage symptoms of Anxiety and Depression.   LCSW collaboration with Sharene Butters, PA-C with [facility], to inquire about whether or not you would be an appropriate candidate for the drug, Lecanemab (Leqembi), a monoclonal antibody infusion given every two weeks. ~ According to Sharene Butters, PA-C, [facility] does not perform research studies, nor are they able to deem candidacy for this drug, but provided a list of funded clinical studies, which include the following:   http://saunders.com/  https://www.triadclinicaltrials.com/     Please go to the website of NIH ([place], as they provide a list of public and privately funded clinical studies conducted around the world.  Continue to take Donepezil 10 mg, po, daily, for treatment of Mild Neurocognitive Disorder due to Alzheimer's Disease/Dementia. If you are concerned about your ability to drive now that you have been diagnosed with Mild Neurocognitive Disorder due to Alzheimer's Disease/Dementia, you can schedule a driving assessment at any of the following locations:  ~ [employer] (# [phone])  ~ Social research officer, government (# [phone])  ~ [facility] (# [phone])  ~  [place] [phone]) Contact LCSW directly [phone]), if you have questions, need assistance, or if additional social work needs are identified between now and our next scheduled telephone outreach call.  Follow-Up:  08/04/2021 at 10:45 am  Please call the care guide team at [phone] if you need to cancel or reschedule your appointment.   If you are experiencing a Mental Health or [place] or need someone to talk to, please call the Suicide and Crisis Lifeline: 988 call the [place] National Suicide Prevention Lifeline: (484) 007-8019 or TTY: (458)356-4624 TTY 8015843627) to talk to a trained counselor call 1-800-273-TALK (toll free, 24 hour hotline) go to [facility] [address] [place] [phone]) call the [place]: [phone] call 911   Patient verbalizes understanding of instructions and care plan provided today and agrees to view in [place]. Active MyChart status and patient understanding of how to access instructions and care plan via MyChart confirmed with patient.     [place] Licensed Clinical Social Worker [facility] [employer] [facility] [phone]

## 2021-07-25 ENCOUNTER — Ambulatory Visit: Payer: Medicare Other | Admitting: Pharmacist

## 2021-07-25 DIAGNOSIS — E782 Mixed hyperlipidemia: Secondary | ICD-10-CM

## 2021-07-25 DIAGNOSIS — F339 Major depressive disorder, recurrent, unspecified: Secondary | ICD-10-CM

## 2021-07-25 DIAGNOSIS — I10 Essential (primary) hypertension: Secondary | ICD-10-CM

## 2021-07-25 DIAGNOSIS — G309 Alzheimer's disease, unspecified: Secondary | ICD-10-CM

## 2021-07-27 NOTE — Chronic Care Management (AMB) (Signed)
Chronic Care Management Pharmacy Note  07/25/2021 Name:  Shannon [name] MRN:  [id number] DOB:  [birth year]  Summary: Application for Elimiron patient assistance program was mailed to patient at last Chronic Care Management visit. Patient states that she has just about completed application but will need to add her 2022 tax return and she is working her tax return currently.  Reviewed medication list / screened for drug-drug interactions. No significant interactions identified.  Patient asked about Leqembi - new medication to treat Alzheimer's disease / mild dementia.  Answered patient's questions about newly approved Leqembi. Dosing is 163m IV infusion over 1 hours every 2 weeks. (Patient thought was pill or subcutaneously injection that her local pharmacist could administer). Cost is also a barrier (About $26,000 per year and not currently covered by Medicare). 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 Provided patient with phone number for ESAI patient assistance program to see about cost. She would also need to find a neurologist that prescribes Leqemib.  We are trying to simplify her medication regimen and at last visit has lowered dose of famotidine to take 257mat bedtime. Patient reports no reflux symptoms. Discussed stopping famotidine but patient would like to continue to take at bedtime.  She will also continue omeprazole 2029maily   Subjective: Shannon [name] an 76 2[age]. year old female who is a primary patient of BlyMosie LukesD.  The CCM team was consulted for assistance with disease management and care coordination needs.    Engaged with patient by telephone for follow up visit in response to provider referral for pharmacy case management and/or care coordination services.   Consent to  Services:  The patient was given information about Chronic Care Management services, agreed to services, and gave verbal consent prior to initiation of services.  Please see initial visit note for detailed documentation.   Patient Care Team: BlyMosie LukesD as PCP - General (Family Medicine) CreStanford BreediDenice BorsD as PCP - Cardiology (Cardiology) EvaDomingo PulseD as Consulting Physician (Urology) DigCalvert CantorD as Consulting Physician (Ophthalmology) CreStanford BreediDenice BorsD as Consulting Physician (Cardiology) WalLuretha RuedN as Case Manager EckCherre RobinsPH-CPP (Pharmacist) Saporito, JoaMaree ErieCSW as Social Worker (Licensed Clinical Social Worker) JafPieter PartridgeO as Consulting Physician (Neurology) BauBarrie FolkCSW as Social Worker (Psychology)  Recent Office Visit:  06/23/2021 - Fam Med (Dr BlyCharlett Blake/U chronic conditions. Labs checked. No med changes.  06/21/2021 - Fam Med (BeOlevia BowensP) Seen for mild neurocognitive disorder. Checked urinalysis. No med changes.  03/01/2021 - Fam Med (Dr BlyCharlett Blakeollow up chronic medical conditions. Still endorses anhedonia and increased anxiety despite the increase in venlafaxine. Increased venlafaxine to 225m52mily (3 tablets of 75mg72m/14/2022 - Fam Med (Dr BlythCharlett Blakelow up. methocarbamol removed from med list - due to patient not taking.   Recent Consults: 07/14/2021 - Neuro (WertBurlene Arnt f/u Alzheimer's disease. No med changes.  05/18/2021 - Cardio (Dr KellyClaiborne Billingsn for new sleep evaluation. Patient has CPAP but low adherence noted. Discussed importance of using CPAP and benefits. F/U 1 year.  04/21/2021 - Teri Mathis Dadunselor 04/12/2021 - Dermatology (Dr JonesWilhemina Bonito13/2023 - Optometry (Dr HuttoLaban Emperorn for mechanical ptosis of eyelids, bilaterally 03/16/2021 - Cardio (Dr CrensStanford Breed cardiomyopathy. No  medication changes noted.  02/14/2021 - Neurology (Dr Tomi Likens) Phone message - patient requested restart gabapentin - Rx [facility]  [facility] 02/03/2021 - Neurology (Dr PPosey Pronto Mild cognitive disorder / Alzheimer's disease. Electromyography   12/29/2020 - Urology (Hanley Seamen NP) Follow up interstitial cystitis. Will have a cystoscopy 02/11/2021. Recommended Myrbetriq 285mdaily. 12/29/2020 - Cardio (Dr CrStanford BreedRechecked palpitations and HTN. Recoommended patient get AliveCor / KaJodelle Redard.    Objective:  Lab Results  Component Value Date   CREATININE 0.93 06/23/2021   CREATININE 0.82 10/28/2020   CREATININE 0.90 09/21/2020    Lab Results  Component Value Date   HGBA1C 5.6 06/23/2021   Last diabetic Eye exam: No results found for: "HMDIABEYEEXA"  Last diabetic Foot exam: No results found for: "HMDIABFOOTEX"      Component Value Date/Time   CHOL 167 06/23/2021 0924   TRIG 236.0 (H) 06/23/2021 0924   HDL 49.90 06/23/2021 0924   CHOLHDL 3 06/23/2021 0924   VLDL 47.2 (H) 06/23/2021 0924   LDLCALC 80 06/05/2018 1215   LDLDIRECT 89.0 06/23/2021 0924       Latest Ref Rng & Units 06/23/2021    9:24 AM 01/18/2021    4:39 PM 10/28/2020   12:14 PM  Hepatic Function  Total Protein 6.0 - 8.3 g/dL 6.1  6.3  6.3   Albumin 3.5 - 5.2 g/dL 4.0   4.0   AST 0 - 37 U/L 19   21   ALT 0 - 35 U/L 19   19   Alk Phosphatase 39 - 117 U/L 113   107   Total Bilirubin 0.2 - 1.2 mg/dL 0.3   0.3     Lab Results  Component Value Date/Time   TSH 3.97 06/23/2021 09:24 AM   TSH 3.11 10/28/2020 12:14 PM       Latest Ref Rng & Units 06/23/2021    9:24 AM 01/18/2021    4:09 PM 10/28/2020   12:14 PM  CBC  WBC 4.0 - 10.5 K/uL 6.9  5.2  7.4   Hemoglobin 12.0 - 15.0 g/dL 12.1  11.4  11.4   Hematocrit 36.0 - 46.0 % 36.7  34.8  35.8   Platelets 150.0 - 400.0 K/uL 231.0  198.0  230.0     Lab Results  Component Value Date/Time   VD25OH 45.18 06/23/2021 09:24 AM   VD25OH 51.73 01/18/2021 04:09 PM    Clinical ASCVD: Yes  The 10-year ASCVD risk score (Arnett DK, et al., 2019) is: 27.1%   Values used to  calculate the score:     Age: [age]     Sex: Female     Is Non-Hispanic African American: No     Diabetic: No     Tobacco smoker: No     Systolic Blood Pressure: 13438mHg     Is BP treated: Yes     HDL Cholesterol: 49.9 mg/dL     Total Cholesterol: 167 mg/dL    Other:  DEXA 09/23/2020 AP Spine L3-L4 09/23/2020  Normal -0.8  AP Spine L3-L4 01/10/2018 Normal -0.6    DualFemur Neck Right 09/23/2020 Osteoporosis -3.0  DualFemur Neck Right 01/10/2018 Osteopenia -2.4    DualFemur Total Mean 09/23/2020 Osteopenia -2.0  DualFemur Total Mean 01/10/2018 Osteopenia -1.5    Left Forearm Radius 33% 09/23/2020 76.2 Osteopenia -1.8  Left Forearm Radius 33% 01/10/2018 73.5 Osteopenia -1.4   Social History   Tobacco Use  Smoking Status Never   Passive exposure: Never  Smokeless  Tobacco Never   BP Readings from Last 3 Encounters:  07/14/21 133/78  06/23/21 112/72  06/21/21 (!) 127/54   Pulse Readings from Last 3 Encounters:  07/14/21 75  06/23/21 61  06/21/21 64   Wt Readings from Last 3 Encounters:  07/14/21 172 lb (78 kg)  06/23/21 169 lb 6.4 oz (76.8 kg)  06/21/21 169 lb (76.7 kg)    Assessment: Review of patient past medical history, allergies, medications, health status, including review of consultants reports, laboratory and other test data, was performed as part of comprehensive evaluation and provision of chronic care management services.   SDOH:  (Social Determinants of Health) assessments and interventions performed:  SDOH Interventions    Flowsheet Row Most Recent Value  SDOH Interventions   Financial Strain Interventions Other (Comment)  [MAP application for Elmiron as been sent to patient in early 2023. She just needs to complete tax return prior to completing application.]  Stress Interventions Provide Counseling  [patient is seeing Education officer, museum for stress management]  Transportation Interventions Intervention Not Indicated        CCM Care  Plan  Allergies  Allergen Reactions   Dilaudid [Hydromorphone Hcl] Itching    Medications Reviewed Today     Reviewed by [name], RPH-CPP (Pharmacist) on 07/25/21 at 88  Med List Status: <None>   Medication Order Taking? Sig Documenting Provider Last Dose Status Informant  albuterol (VENTOLIN HFA) 108 (90 Base) MCG/ACT inhaler [id number] Yes INHALE 1-2 PUFFS INTO THE LUNGS EVERY 6 (SIX) HOURS AS NEEDED FOR WHEEZING OR SHORTNESS OF BREATH. Mosie Lukes, MD Taking Active   Calcium Carbonate (CALCIUM 600 PO) [id number] Yes Take 1 tablet by mouth in the morning and at bedtime. [provider] Taking Active Self  carvedilol (COREG) 25 MG tablet [id number] Yes TAKE 1 TABLET TWICE DAILY WITH A MEAL Crenshaw, Denice Bors, MD Taking Active   cetirizine (ZYRTEC) 10 MG tablet [id number] Yes Take 10 mg by mouth daily. [provider] Taking Active Self  diltiazem (CARDIZEM CD) 120 MG 24 hr capsule [id number] Yes TAKE 1 CAPSULE BY MOUTH EVERY DAY Crenshaw, Denice Bors, MD Taking Active   docusate sodium (COLACE) 250 MG capsule [id number] Yes Take 250 mg by mouth daily as needed for constipation. [provider] Taking Active Self  donepezil (ARICEPT) 10 MG tablet [id number] Yes TAKE 1 TABLET AT BEDTIME Pieter Partridge, DO Taking Active   Ergocalciferol 50 MCG (2000 UT) TABS [id number] Yes Take 2,000 Units by mouth daily. [provider] Taking Active   famotidine (PEPCID) 20 MG tablet [id number] Yes TAKE 1 TABLET BY MOUTH TWICE A DAY  Patient taking differently: Take 20 mg by mouth at bedtime.   Mosie Lukes, MD Taking Active   ferrous sulfate 325 (65 FE) MG tablet [id number] Yes Take 325 mg by mouth daily with breakfast. [provider] Taking Active Self  folic acid (FOLVITE) 1 MG tablet [id number] Yes Take 1 tablet (1 mg total) by mouth daily. Mosie Lukes, MD Taking Active   gabapentin (NEURONTIN) 300 MG capsule [id number] Yes Take 1 capsule (300 mg total) by  mouth 2 (two) times daily. Pieter Partridge, DO Taking Active   hydrOXYzine (ATARAX) 25 MG tablet [id number] Yes TAKE 0.5-1 TABLETS (12.5-25 MG TOTAL) BY MOUTH 2 (TWO) TIMES DAILY AS NEEDED. Mosie Lukes, MD Taking Active   Melatonin 10 MG CAPS [id number] Yes Take 1 capsule by mouth at bedtime. [provider] Taking Active Self  mirabegron ER [facility])  25 MG TB24 tablet [id number] Yes Take 1 tablet by mouth daily. [provider] Taking Active   Multiple Vitamin (MULTIVITAMIN WITH MINERALS) TABS tablet [id number] Yes Take 1 tablet by mouth daily. [provider] Taking Active   omeprazole (PRILOSEC) 20 MG capsule [id number] Yes TAKE 1 CAPSULE BY MOUTH EVERY DAY Mosie Lukes, MD Taking Active   pentosan polysulfate (ELMIRON) 100 MG capsule [id number] Yes Take 100 mg by mouth as directed. two at morning and two at night Dara Lords, Alexzandrew L, PA-C Taking Active Self  rosuvastatin (CRESTOR) 20 MG tablet [id number] Yes TAKE 1 TABLET AT BEDTIME Lelon Perla, MD Taking Active   telmisartan (MICARDIS) 40 MG tablet [id number] Yes Take 1 tablet (40 mg total) by mouth daily. Lelon Perla, MD Taking Active   venlafaxine XR (EFFEXOR-XR) 75 MG 24 hr capsule [id number] Yes Take 3 capsules (225 mg total) by mouth daily with breakfast. 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   Right knee pain 07/10/2020   Urinary frequency 02/11/2020   Debility 12/23/2017   Left knee pain 12/23/2017   Vitamin D deficiency 06/14/2017   Vertebral fracture, osteoporotic 04/05/2016   Left-sided back pain 03/06/2016   Chest pain 02/27/2016   Syncope 02/27/2016    Cataracts, bilateral 12/28/2015   Recurrent falls 12/28/2015   Family history- stomach cancer 09/08/2015   Family history of cancer 09/08/2015   [name] lesion 06/21/2015   Obesity 09/28/2014   Interstitial cystitis 09/28/2014   Pain in joint, ankle and foot 09/28/2014   Abnormal nuclear stress test 12/16/2013   Sternal pain 11/06/2013   Postoperative anemia due to acute blood loss 02/13/2013   Congestive dilated cardiomyopathy ([place]) 12/30/2012   Breast cancer of lower-inner quadrant of right female breast 10/21/2012   Constipation 03/03/2012   OA (osteoarthritis) 09/07/2011   Dermatitis    Hyponatremia 08/28/2011   Osteoporosis 03/07/2011   Prediabetes 06/15/2009   Lipoma 02/10/2009   Dyspnea on exertion 02/10/2009   Hyperlipidemia, mixed 08/11/2008   Major depressive disorder with anxious distress 08/11/2008   Essential hypertension 08/11/2008   GERD (gastroesophageal reflux disease) 08/11/2008   Craniopharyngioma 2000    Immunization History  Administered Date(s) Administered   Fluad Quad(high Dose 65+) 10/14/2018, 12/02/2020   Influenza Split 11/11/2010, 11/14/2011   Influenza Whole 02/10/2009   Influenza, High Dose Seasonal PF 11/04/2015, 10/16/2017, 11/20/2019   Influenza,inj,Quad PF,6+ Mos 10/18/2012, 10/10/2013, 12/21/2014   Influenza-Unspecified 08/30/2016   Moderna Sars-Covid-2 Vaccination 03/30/2019, 04/29/2019   PFIZER Comirnaty(Gray Top)Covid-19 Tri-Sucrose Vaccine 07/05/2020   PFIZER(Purple Top)SARS-COV-2 Vaccination 03/30/2019, 04/29/2019, 11/20/2019   Pfizer Covid-19 Vaccine Bivalent Booster 73yr & up 10/28/2020   Pneumococcal Conjugate-13 12/21/2014   Pneumococcal Polysaccharide-23 09/04/2011   Td 01/30/2001, 12/28/2015   Zoster Recombinat (Shingrix) 05/09/2016    Conditions to be addressed/monitored: HTN, HLD, Anxiety, Depression, Dementia, GERD, Osteoporosis, and interstitial cystitis  Care Plan : General Pharmacy (Adult)  Updates made by  ECherre Robins RPH-CPP since 07/27/2021 12:00 AM     Problem: Hyperlipidemia; hypertenstion; interstitial cystitis; osteopenia; tachycardia; GERD; alzheimers disease; depression / anxiety; pre diabetes; cardiomyopathy; history of breast cancer; osteoarthritis  Long-Range Goal: Provide education, support and care coordination for medication therapy and chronic conditions   Start Date: 12/14/2020  This Visit's Progress: On track  Priority: High  Note:   Current Barriers:  Unable to independently afford treatment regimen Unable to achieve control of hyperlipidemia or hypertension  Cognitive changes  Pharmacist Clinical Goal(s):  Over the next 90 days, patient will verbalize ability to afford treatment regimen achieve control of hyperlipidemia and hypertension  as evidenced by LDL <70 and blood pressure <140/90 adhere to prescribed medication regimen as evidenced by refill history  through collaboration with PharmD and provider.   Interventions: 1:1 collaboration with Mosie Lukes, MD regarding development and update of comprehensive plan of care as evidenced by provider attestation and co-signature Inter-disciplinary care team collaboration (see longitudinal plan of care) Comprehensive medication review performed; medication list updated in electronic medical record  Hypertension / Tachycardia Improving; blood pressure goal <140/90 Current treatment: Carvedilol 70m twice a day (also helps with heart rate) Diltiazem 1269mdaily (also helps with heart rate)  Telmisartan 4092maily Current home readings: mostly 130's / 75 to 85 Denies hypotensive/hypertensive symptoms Patient has started using Kardia (Alivecor) card to monitor HR as recommended by Dr CreStanford BreedInterventions:  Discussed blood pressure goal and importance of blood pressure control in heart and kidney health.  Discussed when was best time to take medications for blood pressure  Continue current blood pressure  regimen   Hyperlipidemia, mixed: Uncontrolled but close to goal; LDL goal <70; Tg < 150 Current treatment:  Rosuvastatin 33m22mily at bedtime Current dietary patterns: not following any particular diet.  Interventions:  Discussed limiting intake of saturated and trans fat - follow heart health diet.  Discussed exercise - patient to start walking in park or going to her son's home gym Consider adding ezetimibe 10mg42mfuture if LDL continues to be > 70  Interstitial Cy[facility]is: [facility] Dr EvansAmalia HaileyDr S. TaChauncey Cruelnenbaum  Currently stable, but notes that cost of Elmiron is high. Patient was denied patient assistance program in 2022 due to selling property in 2021.   Current therapy:  Elmiron 100mg 87me a day Hydroxyzine 25mg -55me 12.5 to 25mg up67mtwice a day if needed.  Myrbetriq 25mg dai63mInterventions:  Patient will completed tax return for 2022 and then complete 2023 patient assistance program application for Elmiron. Screened for Myrbetriq patient assistance program however patient does not qualify.  Continue current medication and follow up with Dr Evans.   Amalia Haileyh Maintenance: (addressed at previous visit)  Reviewed vaccination history and discussed benefits of Shingrix vaccination Patient considering Shingrix in 2023  Medication management Pharmacist Clinical Goal(s): Over the next 90 days, patient will work with PharmD and providers to maintain optimal medication adherence Patient would like to take fewer pills/ medications [facility] Inter[facility] Comprehensive medication review performed. Reviewed refill history and assessed adherence  Continue current medication management strategy Continue lower dose of famotidine to take 33mg at b33mme. Patient reports not reflux symptoms. Discussed stopping famotidine but patient would like to continue to take at bedtime.   Continue to take omeprazole 33mg daily43mnswered patient's questions about newly approved Leqembi. Dosing is 100mg IV inf74mn over 1 hours every 2 weeks. (Patient thought was pill or subcutaneously injection pharmacist could administer). Cost is also a barrier (About $26,000 per year and not usually covered by Medicare). 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 Provided patient with phone number for ESAI patient assistance program to see about cost. She would also need to find a neurologist that prescribes Leqemib.   Patient Goals/Self-Care Activities Over the next 90 days, patient will:  take medications as prescribed,  focus on medication adherence by using weekly medication boxes / reminders,  check blood pressure 2 to 3 times per week, document, and provide at future appointments, and  Complete application for Elmiron for 2023.  Continue lower dose of famotidine 2m at bedtime.  Continue to take omeprazole 26mdaily   Follow Up Plan: Telephone follow up appointment with care management team member scheduled for:  1 to 2 months          Medication Assistance:  Resent patient application for Elmiron patient assistance May 2023 She was denied in 2022 but finances have changed so will retry for 2023. Patient will need to include 2022 tax return (she has not completed her taxes for 2022 yet but will complete application once she does)   [facility]  [id number] [place] [id number]: [phone]: [phone] [id number]: [phone]: [phone] pill box? Yes - daughter is a nurMarine scientistd she helps with  patient's weekly medication boxes.  Pt endorses 98% compliance  Follow Up:  Patient agrees to Care Plan and Follow-up.  Plan: Telephone follow up appointment with care management team member scheduled for:   1 to 2 months   TamCherre RobinsharmD Clinical Pharmacist [facility]

## 2021-07-27 NOTE — Patient Instructions (Signed)
Shannon [name] It was a pleasure speaking with you  Below is a summary of your health goals; You can view an updated copy of your Chronic Care Management care plan on MyChart.   Patient Goals/Self-Care Activities Over the next 90 days, patient will:  take medications as prescribed,  focus on medication adherence by using weekly medication boxes / reminders,  check blood pressure 2 to 3 times per week, document, and provide at future appointments, and  Complete application for Elmiron for 2023.  Continue lower dose of famotidine '20mg'$  at bedtime.  Continue to take omeprazole '20mg'$  daily  Start regular exercise - walking or use son's home gym. Goal is to start with 10 to 30 minutes per day and work up to 150 minutes per week.   If you have any questions or concerns, please feel free to contact me either at the phone number below or with a MyChart message.   Keep up the good work!  Shannon [name], PharmD Clinical Pharmacist [facility] [place] [phone] (direct line)  [phone] (main office number)   Patient verbalizes understanding of instructions and care plan provided today and agrees to view in [place]. Active MyChart status and patient understanding of how to access instructions and care plan via MyChart confirmed with patient.

## 2021-07-28 ENCOUNTER — Telehealth: Payer: Self-pay | Admitting: Physician Assistant

## 2021-07-28 NOTE — Telephone Encounter (Signed)
Called Pt and gave information Per Shannon [name], she understood

## 2021-07-28 NOTE — Telephone Encounter (Signed)
Pt called in and left a message with the access nurse. She is wanting to get some information about the drug Laqembi that Clarise Cruz talked about. She has not started the medication yet and wants to know where she can get it since it's an infusion. Full access nurse report is in [employer]

## 2021-07-29 DIAGNOSIS — I10 Essential (primary) hypertension: Secondary | ICD-10-CM | POA: Diagnosis not present

## 2021-07-29 DIAGNOSIS — E785 Hyperlipidemia, unspecified: Secondary | ICD-10-CM | POA: Diagnosis not present

## 2021-08-01 ENCOUNTER — Ambulatory Visit (INDEPENDENT_AMBULATORY_CARE_PROVIDER_SITE_OTHER): Payer: Medicare Other

## 2021-08-01 ENCOUNTER — Other Ambulatory Visit: Payer: Self-pay | Admitting: Neurology

## 2021-08-01 DIAGNOSIS — I10 Essential (primary) hypertension: Secondary | ICD-10-CM

## 2021-08-01 DIAGNOSIS — F0670 Mild neurocognitive disorder due to known physiological condition without behavioral disturbance: Secondary | ICD-10-CM

## 2021-08-01 DIAGNOSIS — E782 Mixed hyperlipidemia: Secondary | ICD-10-CM

## 2021-08-01 NOTE — Chronic Care Management (AMB) (Signed)
Chronic Care Management   CCM RN Visit Note  08/01/2021 Name: Shannon [name] MRN: [id number] DOB: [birth year]  Subjective: Shannon [name] is a 76 y.o. year old female who is a primary care patient of Shannon Lukes, MD. The care management team was consulted for assistance with disease management and care coordination needs.    Engaged with patient by telephone for follow up visit in response to provider referral for case management and/or care coordination services.   Consent to Services:  The patient was given information about Chronic Care Management services, agreed to services, and gave verbal consent prior to initiation of services.  Please see initial visit note for detailed documentation.   Patient agreed to services and verbal consent obtained.   Assessment: Review of patient past medical history, allergies, medications, health status, including review of consultants reports, laboratory and other test data, was performed as part of comprehensive evaluation and provision of chronic care management services.   SDOH (Social Determinants of Health) assessments and interventions performed:    CCM Care Plan  Allergies  Allergen Reactions   Dilaudid [Hydromorphone Hcl] Itching    Outpatient Encounter Medications as of 08/01/2021  Medication Sig   albuterol (VENTOLIN HFA) 108 (90 Base) MCG/ACT inhaler INHALE 1-2 PUFFS INTO THE LUNGS EVERY 6 (SIX) HOURS AS NEEDED FOR WHEEZING OR SHORTNESS OF BREATH.   Calcium Carbonate (CALCIUM 600 PO) Take 1 tablet by mouth in the morning and at bedtime.   carvedilol (COREG) 25 MG tablet TAKE 1 TABLET TWICE DAILY WITH A MEAL   cetirizine (ZYRTEC) 10 MG tablet Take 10 mg by mouth daily.   diltiazem (CARDIZEM CD) 120 MG 24 hr capsule TAKE 1 CAPSULE BY MOUTH EVERY DAY   docusate sodium (COLACE) 250 MG capsule Take 250 mg by mouth daily as needed for constipation.   donepezil (ARICEPT) 10 MG tablet TAKE 1 TABLET AT BEDTIME   Ergocalciferol 50 MCG (2000 UT)  TABS Take 2,000 Units by mouth daily.   famotidine (PEPCID) 20 MG tablet TAKE 1 TABLET BY MOUTH TWICE A DAY (Patient taking differently: Take 20 mg by mouth at bedtime.)   ferrous sulfate 325 (65 FE) MG tablet Take 325 mg by mouth daily with breakfast.   folic acid (FOLVITE) 1 MG tablet Take 1 tablet (1 mg total) by mouth daily.   gabapentin (NEURONTIN) 300 MG capsule Take 1 capsule (300 mg total) by mouth 2 (two) times daily.   hydrOXYzine (ATARAX) 25 MG tablet TAKE 0.5-1 TABLETS (12.5-25 MG TOTAL) BY MOUTH 2 (TWO) TIMES DAILY AS NEEDED.   Melatonin 10 MG CAPS Take 1 capsule by mouth at bedtime.   mirabegron ER (MYRBETRIQ) 25 MG TB24 tablet Take 1 tablet by mouth daily.   Multiple Vitamin (MULTIVITAMIN WITH MINERALS) TABS tablet Take 1 tablet by mouth daily.   omeprazole (PRILOSEC) 20 MG capsule TAKE 1 CAPSULE BY MOUTH EVERY DAY   pentosan polysulfate (ELMIRON) 100 MG capsule Take 100 mg by mouth as directed. two at morning and two at night   rosuvastatin (CRESTOR) 20 MG tablet TAKE 1 TABLET AT BEDTIME   telmisartan (MICARDIS) 40 MG tablet Take 1 tablet (40 mg total) by mouth daily.   venlafaxine XR (EFFEXOR-XR) 75 MG 24 hr capsule Take 3 capsules (225 mg total) by mouth daily with breakfast.   No facility-administered encounter medications on file as of 08/01/2021.    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   Right knee pain 07/10/2020   Urinary frequency 02/11/2020   Debility 12/23/2017   Left knee pain 12/23/2017   Vitamin D deficiency 06/14/2017   Vertebral fracture, osteoporotic 04/05/2016   Left-sided back pain 03/06/2016   Chest pain 02/27/2016   Syncope 02/27/2016   Cataracts, bilateral 12/28/2015    Recurrent falls 12/28/2015   Family history- stomach cancer 09/08/2015   Family history of cancer 09/08/2015   [name] lesion 06/21/2015   Obesity 09/28/2014   Interstitial cystitis 09/28/2014   Pain in joint, ankle and foot 09/28/2014   Abnormal nuclear stress test 12/16/2013   Sternal pain 11/06/2013   Postoperative anemia due to acute blood loss 02/13/2013   Congestive dilated cardiomyopathy ([place]) 12/30/2012   Breast cancer of lower-inner quadrant of right female breast 10/21/2012   Constipation 03/03/2012   OA (osteoarthritis) 09/07/2011   Dermatitis    Hyponatremia 08/28/2011   Osteoporosis 03/07/2011   Prediabetes 06/15/2009   Lipoma 02/10/2009   Dyspnea on exertion 02/10/2009   Hyperlipidemia, mixed 08/11/2008   Major depressive disorder with anxious distress 08/11/2008   Essential hypertension 08/11/2008   GERD (gastroesophageal reflux disease) 08/11/2008   Craniopharyngioma 2000    Conditions to be addressed/monitored:HTN, HLD, Anxiety, Depression, Dementia, and Interstitial Cystitis  Care Plan : RN Case Manager Plan of Care  Updates made by Luretha Rued, RN since 08/01/2021 12:00 AM  Completed 08/01/2021   Problem: No Plan of Care Established for Managment of Chronic Disease (Neurocognitive Disorder, Anxiety/Depression) Resolved 08/01/2021  Priority: High     Long-Range Goal: Development of Plan of Care for Chronic Disease Management (Neurocognitive Disorder, Anxiety/Depression) Completed 08/01/2021  Start Date: 12/13/2020  Expected End Date: 09/15/2021  Priority: High  Note:   Current Barriers:  08/01/21 Shannon [name] reports no concerns at this time. She expresses re: dementia, that she is working with provider to see if she can get access to a new medication, Leqembi, and states has discussed with providers. She reports a supportive family who assist as needed. Currently, she has all her medications, transportation is not a problem. She continues to manage her  healthcare. RNCM discussed RN case closure. Patient is in agreement. Knowledge Deficits related to plan of care for management of neurocognitive disorder, anxiety/depression  Cognitive Deficits/Memory loss-reports strategies for managing memory loss, keeping notes, writing things down. Medication management-improved. Daughter assist with medication management.   RNCM Clinical Goal(s):  Patient will verbalize understanding of plan for management of Anxiety, Depression, and Dementia as evidenced by taking medications as prescribed, following up with provider appointments as schedule, engaging with LCSW and/or referred specialist. take all medications exactly as prescribed and will call provider for medication related questions as evidenced by medication adherance    work with pharmacist to address medications assistance program if available for [place] and follow up on medication managment related to chronic disease as evidenced by review of EMR and patient or pharmacist report    work with Education officer, museum to address Mental Health Concerns , Cognitive Deficits, and Memory Deficits related to the management of Anxiety and Depression as evidenced by review of EMR and patient or social worker report     through collaboration with Consulting civil engineer, provider, and care team.   Interventions: 1:1 collaboration with primary care provider regarding  development and update of comprehensive plan of care as evidenced by provider attestation and co-signature Inter-disciplinary care team collaboration (see longitudinal plan of care) Evaluation of current treatment plan related to  self management and patient's adherence to plan as established by provider  Interstitial Cystitis  (Status:  Goal Met.)  Long Term Goal  Patient reports condition stable as long as she takes the medication. Managed by [facility] Evaluation of current treatment plan related to interstitial cystitis and patient's adherence to  plan as established by provider Advised patient to continue to take medications as prescribed   Discussed upcoming/scheduled appointments-next appointment 09/12/21  Hyperlipidemia Interventions:  (Status:  Goal Met.) Long Term Goal Verbalized recommendations for management Encouraged to continue to take medications as prescribed Encouraged to work with providers and clinical pharmacist as recommended  Discussed healthy eating and encouraged heart healthy diet such as MIND or DASH, avoid trans fats and simple carbohydrate and processed foods   Reviewed upcoming/scheduled appointments Encouraged to continue to remain active  Hypertension Interventions:  (Status:  Goal Met.) Long Term Goal Last practice recorded BP readings:  BP Readings from Last 3 Encounters:  07/14/21 133/78  06/23/21 112/72  06/21/21 (!) 127/54  Most recent eGFR/CrCl:  Lab Results  Component Value Date   EGFR 72 (L) 08/07/2016    No components found for: CRCL  Encouraged to continue to take medications as scheduled Discussed healthy eating, encouraged to continue to monitor salt intake Upcoming appointments reviewed  Dementia InterventionsGoal Met. Long Term Continues to manage her healthcare; she attends Neurology and other provider visits as scheduled and is taking medications as prescribed. Evaluation of current treatment plan related to Dementia, Memory Deficits self-management and patient's adherence to plan as established by provider. Reiterated importance of eating a healthy diet Reviewed upcoming appointments.  Anxiety/Depression Interventions Goal Met. Long term Patient is working with embedded LCSW and Financial controller LCSW regarding mental health. Upcoming appointment 08/04/21 and 09/10/21 respectively. Evaluation of current treatment plan related to Anxiety and Depression, Memory Deficits self-management and patient's adherence to plan as established by provider. Encouraged to continue to work with embedded LCSW  [employer] and [employer], LCSW.  Medications reviewed and encouraged to take as prescribed Encouraged to remain active    Patient Goals/Self-Care Activities: Take medications as prescribed   Attend all scheduled provider appointments Call provider office for new concerns or questions  Continue to Eat Healthy: fruits, vegetables, lean meat, healthy fats; avoid trans fats and simple carbohydrate and processed foods, consider a krill, fish oil or flaxseed capsule daily.  Participate in some type of activity you enjoy every day   Plan:No further follow up required: Patient encouraged to contact Primary Care Provider if any care management needs in the future.  Thea Silversmith, RN, MSN, BSN, CCM Care Management Coordinator [facility] [phone]

## 2021-08-01 NOTE — Patient Instructions (Signed)
Visit Information  Thank you for allowing me to share the care management and care coordination services that are available to you as part of your health plan and services through your primary care provider and medical home. Please reach out to your Primary Care Provider or me at [phone] if the care management/care coordination team may be of assistance to you in the future.   Thea Silversmith, RN, MSN, BSN, CCM Care Management Coordinator [facility] [phone]

## 2021-08-04 ENCOUNTER — Ambulatory Visit: Payer: Medicare Other | Admitting: *Deleted

## 2021-08-04 DIAGNOSIS — E782 Mixed hyperlipidemia: Secondary | ICD-10-CM

## 2021-08-04 DIAGNOSIS — F339 Major depressive disorder, recurrent, unspecified: Secondary | ICD-10-CM

## 2021-08-04 DIAGNOSIS — F0670 Mild neurocognitive disorder due to known physiological condition without behavioral disturbance: Secondary | ICD-10-CM

## 2021-08-04 DIAGNOSIS — F418 Other specified anxiety disorders: Secondary | ICD-10-CM

## 2021-08-04 DIAGNOSIS — I10 Essential (primary) hypertension: Secondary | ICD-10-CM

## 2021-08-04 DIAGNOSIS — R296 Repeated falls: Secondary | ICD-10-CM

## 2021-08-04 DIAGNOSIS — F0390 Unspecified dementia without behavioral disturbance: Secondary | ICD-10-CM

## 2021-08-04 NOTE — Patient Instructions (Signed)
Visit Information  Thank you for taking time to visit with me today. Please don't hesitate to contact me if I can be of assistance to you before our next scheduled telephone appointment.  Following are the goals we discussed today:  Patient Goals/Self-Care Activities: Keep scheduled appointments with Clint Bolder, LCSW with [facility] [phone]), in an effort to receive ongoing mental health counseling and supportive services, as well as reduce and manage symptoms of Anxiety and Depression.   Contact LCSW directly (# [phone]), if you have questions, need assistance, or if additional social work needs are identified in the near future.    No Follow-Up Required.    Please call the care guide team at [phone] if you need to cancel or reschedule your appointment.   If you are experiencing a Mental Health or [place] or need someone to talk to, please call the Suicide and Crisis Lifeline: 988 call the [place] National Suicide Prevention Lifeline: 334-420-9576 or TTY: (930)011-1112 TTY 5181793072) to talk to a trained counselor call 1-800-273-TALK (toll free, 24 hour hotline) go to [facility] [address] [place] [phone]) call the [place]: [phone] call 911   Patient verbalizes understanding of instructions and care plan provided today and agrees to view in [place]. Active MyChart status and patient understanding of how to access instructions and care plan via MyChart confirmed with patient.     [place] Licensed Clinical Social Worker [facility] [phone]

## 2021-08-04 NOTE — Chronic Care Management (AMB) (Signed)
Chronic Care Management    Clinical Social Work Note  08/04/2021 Name: Shannon [name] MRN: [id number] DOB: Apr 21, 1945  Shannon [name] is a 76 y.o. year old female who is a primary care patient of Mosie Lukes, MD. The CCM team was consulted to assist the patient with chronic disease management and/or care coordination needs related to: [employer] and [place] and Resources.   Engaged with patient by telephone for follow up visit in response to provider referral for social work chronic care management and care coordination services.   Consent to Services:  The patient was given information about Chronic Care Management services, agreed to services, and gave verbal consent prior to initiation of services.  Please see initial visit note for detailed documentation.   Patient agreed to services and consent obtained.   Assessment: Review of patient past medical history, allergies, medications, and health status, including review of relevant consultants reports was performed today as part of a comprehensive evaluation and provision of chronic care management and care coordination services.     SDOH (Social Determinants of Health) assessments and interventions performed:    Advanced Directives Status: Not addressed in this encounter.  CCM Care Plan  Allergies  Allergen Reactions   Dilaudid [Hydromorphone Hcl] Itching    Outpatient Encounter Medications as of 08/04/2021  Medication Sig   albuterol (VENTOLIN HFA) 108 (90 Base) MCG/ACT inhaler INHALE 1-2 PUFFS INTO THE LUNGS EVERY 6 (SIX) HOURS AS NEEDED FOR WHEEZING OR SHORTNESS OF BREATH.   Calcium Carbonate (CALCIUM 600 PO) Take 1 tablet by mouth in the morning and at bedtime.   carvedilol (COREG) 25 MG tablet TAKE 1 TABLET TWICE DAILY WITH A MEAL   cetirizine (ZYRTEC) 10 MG tablet Take 10 mg by mouth daily.   diltiazem (CARDIZEM CD) 120 MG 24 hr capsule TAKE 1 CAPSULE BY MOUTH EVERY DAY   docusate sodium (COLACE) 250  MG capsule Take 250 mg by mouth daily as needed for constipation.   donepezil (ARICEPT) 10 MG tablet TAKE 1 TABLET AT BEDTIME   Ergocalciferol 50 MCG (2000 UT) TABS Take 2,000 Units by mouth daily.   famotidine (PEPCID) 20 MG tablet TAKE 1 TABLET BY MOUTH TWICE A DAY (Patient taking differently: Take 20 mg by mouth at bedtime.)   ferrous sulfate 325 (65 FE) MG tablet Take 325 mg by mouth daily with breakfast.   folic acid (FOLVITE) 1 MG tablet Take 1 tablet (1 mg total) by mouth daily.   gabapentin (NEURONTIN) 300 MG capsule Take 1 capsule (300 mg total) by mouth 2 (two) times daily.   hydrOXYzine (ATARAX) 25 MG tablet TAKE 0.5-1 TABLETS (12.5-25 MG TOTAL) BY MOUTH 2 (TWO) TIMES DAILY AS NEEDED.   Melatonin 10 MG CAPS Take 1 capsule by mouth at bedtime.   mirabegron ER (MYRBETRIQ) 25 MG TB24 tablet Take 1 tablet by mouth daily.   Multiple Vitamin (MULTIVITAMIN WITH MINERALS) TABS tablet Take 1 tablet by mouth daily.   omeprazole (PRILOSEC) 20 MG capsule TAKE 1 CAPSULE BY MOUTH EVERY DAY   pentosan polysulfate (ELMIRON) 100 MG capsule Take 100 mg by mouth as directed. two at morning and two at night   rosuvastatin (CRESTOR) 20 MG tablet TAKE 1 TABLET AT BEDTIME   telmisartan (MICARDIS) 40 MG tablet Take 1 tablet (40 mg total) by mouth daily.   venlafaxine XR (EFFEXOR-XR) 75 MG 24 hr capsule Take 3 capsules (225 mg total) by mouth daily with breakfast.   No facility-administered encounter medications  on file as of 08/04/2021.    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   Right knee pain 07/10/2020   Urinary frequency 02/11/2020   Debility 12/23/2017   Left knee pain 12/23/2017   Vitamin D deficiency 06/14/2017    Vertebral fracture, osteoporotic 04/05/2016   Left-sided back pain 03/06/2016   Chest pain 02/27/2016   Syncope 02/27/2016   Cataracts, bilateral 12/28/2015   Recurrent falls 12/28/2015   Family history- stomach cancer 09/08/2015   Family history of cancer 09/08/2015   [name] lesion 06/21/2015   Obesity 09/28/2014   Interstitial cystitis 09/28/2014   Pain in joint, ankle and foot 09/28/2014   Abnormal nuclear stress test 12/16/2013   Sternal pain 11/06/2013   Postoperative anemia due to acute blood loss 02/13/2013   Congestive dilated cardiomyopathy ([place]) 12/30/2012   Breast cancer of lower-inner quadrant of right female breast 10/21/2012   Constipation 03/03/2012   OA (osteoarthritis) 09/07/2011   Dermatitis    Hyponatremia 08/28/2011   Osteoporosis 03/07/2011   Prediabetes 06/15/2009   Lipoma 02/10/2009   Dyspnea on exertion 02/10/2009   Hyperlipidemia, mixed 08/11/2008   Major depressive disorder with anxious distress 08/11/2008   Essential hypertension 08/11/2008   GERD (gastroesophageal reflux disease) 08/11/2008   Craniopharyngioma 2000    Conditions to be addressed/monitored: Depression, Anxiety and Alzheimer's/Dementia.  Mental Health Concerns, Social Isolation, Cognitive Deficits, Memory Deficits, and Lacks Knowledge of [employer].  Care Plan : LCSW Plan of Care  Updates made by Francis Gaines, LCSW since 08/04/2021 12:00 AM     Problem: Reduce and Manage My Symptoms of Anxiety and Depression. Resolved 08/04/2021  Priority: High     Long-Range Goal: Reduce and Manage My Symptoms of Anxiety and Depression. Completed 08/04/2021  Start Date: 12/30/2020  Expected End Date: 08/04/2021  This Visit's Progress: On track  Recent Progress: On track  Priority: High  Note:   Current Barriers:   Acute Mental Health needs related to Debility, Obesity, Major Depressive Disorder with Anxious Distress, and Mild Neurocognitive Disorder due to Alzheimer's Disease,  requires Support, Education, Resources, Referrals, Advocacy, and Care Coordination, in order to meet unmet mental health needs. Clinical Goal(s):  Patient will work with LCSW to reduce and manage symptoms of Anxiety and Depression, until re-established with a community mental health provider.     Patient will increase knowledge and/or ability of:        Coping Skills, Healthy Habits, Self-Management Skills, Stress Reduction, Home Safety and Utilizing [employer] and Resources.   Interventions: Collaboration with Primary Care Physician, Dr. Penni Homans regarding development and update of comprehensive plan of care, as evidenced by provider attestation and co-signature. Inter-disciplinary care team collaboration (see longitudinal plan of care). Clinical Interventions:  Solution-Focused Therapy Performed. Emotional Support Provided. Verbalization of Feelings Encouraged. Problem Solving Solutions Developed. Client-Centered Therapy Initiated.   Patient Goals/Self-Care Activities: Keep scheduled appointments with Clint Bolder, LCSW with [facility] [phone]), in an effort to receive ongoing mental health counseling and supportive services, as well as reduce and manage symptoms of Anxiety and Depression.   Contact LCSW directly (# [phone]), if you have questions, need assistance, or if additional social work needs are identified in the  near future.    No Follow-Up Required.     [place] Licensed Clinical Social Worker [facility] [phone]

## 2021-08-15 ENCOUNTER — Other Ambulatory Visit: Payer: Self-pay | Admitting: Cardiology

## 2021-08-29 DIAGNOSIS — E782 Mixed hyperlipidemia: Secondary | ICD-10-CM

## 2021-08-29 DIAGNOSIS — F0390 Unspecified dementia without behavioral disturbance: Secondary | ICD-10-CM | POA: Diagnosis not present

## 2021-08-29 DIAGNOSIS — G309 Alzheimer's disease, unspecified: Secondary | ICD-10-CM | POA: Diagnosis not present

## 2021-08-29 DIAGNOSIS — I10 Essential (primary) hypertension: Secondary | ICD-10-CM

## 2021-08-29 DIAGNOSIS — F418 Other specified anxiety disorders: Secondary | ICD-10-CM

## 2021-08-29 DIAGNOSIS — F339 Major depressive disorder, recurrent, unspecified: Secondary | ICD-10-CM | POA: Diagnosis not present

## 2021-08-29 DIAGNOSIS — F0670 Mild neurocognitive disorder due to known physiological condition without behavioral disturbance: Secondary | ICD-10-CM

## 2021-08-30 ENCOUNTER — Telehealth: Payer: Medicare Other

## 2021-08-31 ENCOUNTER — Ambulatory Visit (INDEPENDENT_AMBULATORY_CARE_PROVIDER_SITE_OTHER): Payer: Medicare Other | Admitting: Psychology

## 2021-08-31 ENCOUNTER — Other Ambulatory Visit: Payer: Self-pay | Admitting: Family Medicine

## 2021-08-31 DIAGNOSIS — F4323 Adjustment disorder with mixed anxiety and depressed mood: Secondary | ICD-10-CM

## 2021-08-31 NOTE — Progress Notes (Signed)
[place] Counselor/Therapist Progress Note  Patient ID: Shannon [name], MRN: [id number],    Date: 08/31/2021  Time Spent: 4:15pm-5:00pm     45 minutes   Treatment Type: Individual Therapy  Reported Symptoms: anxiety, stress  Mental Status Exam: Appearance:  Casual     Behavior: Appropriate  Motor: Normal  Speech/Language:  Normal Rate  Affect: Appropriate  Mood: normal  Thought process: normal  Thought content:   WNL  Sensory/Perceptual disturbances:   WNL  Orientation: oriented to person, place, time/date, and situation  Attention: Good  Concentration: Good  Memory: WNL  Fund of knowledge:  Good  Insight:   Good  Judgment:  Good  Impulse Control: Good   Risk Assessment: Danger to Self:  No Self-injurious Behavior: No Danger to Others: No Duty to Warn:no Physical Aggression / Violence:No  Access to Firearms a concern: No  Gang Involvement:No   Subjective:  Pt present for face-to-face individual therapy in person.   Pt's husband participated in the session.   Pt states she is noticing more signs of cognitive decline.  Pt's husband states he feels she has not gotten worse.   Idendtified that pt is just feeling increasingly frustrated with her memory issues.   Pt's husband states pt has always been self conscious about things.  Worked with pt on accommodations she can make to help with her memory. Pt is feeling mad that she has dementia.  Pt is sad that people have distanced from her in church.   Pt states she is feeling overwhelmed with all of the stuff in her house and she has a lot to get rid of. Addressed what pt can add to her life to bring her joy.  [place].   Provided supportive therapy.    Interventions: Cognitive Behavioral Therapy and Insight-Oriented  Diagnosis:  F43.23  Plan: Plan of Care:  Recommend ongoing therapy.   Pt participated in setting treatment goals.   Plan to meet every two weeks.   Treatment Plan (Treatment Plan Target Date: 04/22/2022) Client Abilities/Strengths  Pt is engaging and motivated for therapy.  Client Treatment Preferences  Individual therapy.  Client Statement of Needs  Improve copings skills and have a place to talk about her feelings.   Symptoms  Depressed or irritable mood. Excessive and/or unrealistic worry that is difficult to control occurring more days than not for at least 6 months about a number of events or activities. Hypervigilance (e.g., feeling constantly on edge, experiencing concentration difficulties, having trouble falling or staying asleep, exhibiting a general state of irritability). Low self-esteem. Problems Addressed  Unipolar Depression, Anxiety Goals 1. Alleviate depressive symptoms and return to previous level of effective functioning. 2. Appropriately grieve the loss in order to normalize mood and to return to previously adaptive level of functioning. Objective Learn and implement behavioral strategies to overcome depression. Target Date: 2022-04-22 Frequency: Biweekly  Progress: 10 Modality: individual  Related Interventions Assist the client in developing skills that increase the likelihood of deriving pleasure from behavioral activation (e.g., assertiveness skills, developing an exercise plan, less internal/more external focus, increased social involvement); reinforce success. Engage the client in "behavioral activation," increasing his/her activity level and contact with sources of reward, while identifying processes that inhibit activation. use behavioral techniques such as instruction, rehearsal, role-playing, role reversal, as needed, to facilitate activity in the client's daily life; reinforce success. 3. Develop healthy interpersonal relationships that lead to the alleviation and help prevent  the relapse of depression. 4. Develop healthy thinking patterns and beliefs about self, others, and the world that lead to the  alleviation and help prevent the relapse of depression. 5. Enhance ability to effectively cope with the full variety of life's worries and anxieties. 6. Learn and implement coping skills that result in a reduction of anxiety and worry, and improved daily functioning. Objective Learn and implement problem-solving strategies for realistically addressing worries. Target Date: 2022-04-22 Frequency: Biweekly  Progress: 10 Modality: individual  Related Interventions Assign the client a homework exercise in which he/she problem-solves a current problem (see Mastery of Your Anxiety and Worry: Workbook by Adora Fridge and Eliot Ford or Generalized Anxiety Disorder by Eather Colas, and Eliot Ford); review, reinforce success, and provide corrective feedback toward improvement. Teach the client problem-solving strategies involving specifically defining a problem, generating options for addressing it, evaluating the pros and cons of each option, selecting and implementing an optional action, and reevaluating and refining the action. Objective Learn and implement calming skills to reduce overall anxiety and manage anxiety symptoms. Target Date: 2022-04-22 Frequency: Biweekly  Progress: 10 Modality: individual  Related Interventions Assign the client to read about progressive muscle relaxation and other calming strategies in relevant books or treatment manuals (e.g., Progressive Relaxation Training by Leroy Kennedy; Mastery of Your Anxiety and Worry: Workbook by Beckie Busing). Assign the client homework each session in which he/she practices relaxation exercises daily, gradually applying them progressively from non-anxiety-provoking to anxiety-provoking situations; review and reinforce success while providing corrective feedback toward improvement. Teach the client calming/relaxation skills (e.g., applied relaxation, progressive muscle relaxation, cue controlled relaxation; mindful breathing; biofeedback) and  how to discriminate better between relaxation and tension; teach the client how to apply these skills to his/her daily life. 7. Recognize, accept, and cope with feelings of depression. 8. Reduce overall frequency, intensity, and duration of the anxiety so that daily functioning is not impaired. 9. Resolve the core conflict that is the source of anxiety. 10. Stabilize anxiety level while increasing ability to function on a daily basis. Diagnosis F43.23   Clint Bolder, LCSW

## 2021-09-12 DIAGNOSIS — R350 Frequency of micturition: Secondary | ICD-10-CM | POA: Diagnosis not present

## 2021-09-12 DIAGNOSIS — R3581 Nocturnal polyuria: Secondary | ICD-10-CM | POA: Diagnosis not present

## 2021-09-12 DIAGNOSIS — R3915 Urgency of urination: Secondary | ICD-10-CM | POA: Diagnosis not present

## 2021-09-12 DIAGNOSIS — N3010 Interstitial cystitis (chronic) without hematuria: Secondary | ICD-10-CM | POA: Diagnosis not present

## 2021-09-12 DIAGNOSIS — R351 Nocturia: Secondary | ICD-10-CM | POA: Diagnosis not present

## 2021-09-12 DIAGNOSIS — R102 Pelvic and perineal pain: Secondary | ICD-10-CM | POA: Diagnosis not present

## 2021-09-22 ENCOUNTER — Other Ambulatory Visit: Payer: Self-pay | Admitting: Family Medicine

## 2021-09-25 NOTE — Assessment & Plan Note (Signed)
Following with neurology, stable on current meds 

## 2021-09-25 NOTE — Assessment & Plan Note (Signed)
Supplement and monitor 

## 2021-09-25 NOTE — Assessment & Plan Note (Signed)
Continue to monitor, stable

## 2021-09-25 NOTE — Assessment & Plan Note (Signed)
Well controlled, no changes to meds. Encouraged heart healthy diet such as the DASH diet and exercise as tolerated.  °

## 2021-09-26 ENCOUNTER — Ambulatory Visit (INDEPENDENT_AMBULATORY_CARE_PROVIDER_SITE_OTHER): Payer: Medicare Other | Admitting: Family Medicine

## 2021-09-26 VITALS — BP 128/72 | HR 65 | Temp 98.7°F | Resp 16 | Ht 64.0 in | Wt 174.4 lb

## 2021-09-26 DIAGNOSIS — E559 Vitamin D deficiency, unspecified: Secondary | ICD-10-CM

## 2021-09-26 DIAGNOSIS — F0390 Unspecified dementia without behavioral disturbance: Secondary | ICD-10-CM

## 2021-09-26 DIAGNOSIS — I10 Essential (primary) hypertension: Secondary | ICD-10-CM | POA: Diagnosis not present

## 2021-09-26 DIAGNOSIS — E782 Mixed hyperlipidemia: Secondary | ICD-10-CM

## 2021-09-26 DIAGNOSIS — F0670 Mild neurocognitive disorder due to known physiological condition without behavioral disturbance: Secondary | ICD-10-CM

## 2021-09-26 DIAGNOSIS — R7303 Prediabetes: Secondary | ICD-10-CM

## 2021-09-26 DIAGNOSIS — I420 Dilated cardiomyopathy: Secondary | ICD-10-CM | POA: Diagnosis not present

## 2021-09-26 DIAGNOSIS — G309 Alzheimer's disease, unspecified: Secondary | ICD-10-CM | POA: Diagnosis not present

## 2021-09-26 DIAGNOSIS — F339 Major depressive disorder, recurrent, unspecified: Secondary | ICD-10-CM | POA: Diagnosis not present

## 2021-09-26 NOTE — Assessment & Plan Note (Signed)
Is following with neurology now and acknowledges her dementia is progressing slightly. More trouble remembering all of her work around the house.

## 2021-09-26 NOTE — Assessment & Plan Note (Signed)
Responding to Venlafaxine continue the same

## 2021-09-26 NOTE — Patient Instructions (Addendum)
RSV (Respiratory Syncitial Virus) vaccine at pharmacy  High Dose flu shot in September at pharmacy or at our office  New Covid booster in [place], Adult High blood pressure (hypertension) is when the force of blood pumping through the arteries is too strong. The arteries are the blood vessels that carry blood from the heart throughout the body. Hypertension forces the heart to work harder to pump blood and may cause arteries to become narrow or stiff. Untreated or uncontrolled hypertension can lead to a heart attack, heart failure, a stroke, kidney disease, and other problems. A blood pressure reading consists of a higher number over a lower number. Ideally, your blood pressure should be below 120/80. The first ("top") number is called the systolic pressure. It is a measure of the pressure in your arteries as your heart beats. The second ("bottom") number is called the diastolic pressure. It is a measure of the pressure in your arteries as the heart relaxes. What are the causes? The exact cause of this condition is not known. There are some conditions that result in high blood pressure. What increases the risk? Certain factors may make you more likely to develop high blood pressure. Some of these risk factors are under your control, including: Smoking. Not getting enough exercise or physical activity. Being overweight. Having too much fat, sugar, calories, or salt (sodium) in your diet. Drinking too much alcohol. Other risk factors include: Having a personal history of heart disease, diabetes, high cholesterol, or kidney disease. Stress. Having a family history of high blood pressure and high cholesterol. Having obstructive sleep apnea. Age. The risk increases with age. What are the signs or symptoms? High blood pressure may not cause symptoms. Very high blood pressure (hypertensive crisis) may cause: Headache. Fast or irregular heartbeats (palpitations). Shortness of  breath. Nosebleed. Nausea and vomiting. Vision changes. Severe chest pain, dizziness, and seizures. How is this diagnosed? This condition is diagnosed by measuring your blood pressure while you are seated, with your arm resting on a flat surface, your legs uncrossed, and your feet flat on the floor. The cuff of the blood pressure monitor will be placed directly against the skin of your upper arm at the level of your heart. Blood pressure should be measured at least twice using the same arm. Certain conditions can cause a difference in blood pressure between your right and left arms. If you have a high blood pressure reading during one visit or you have normal blood pressure with other risk factors, you may be asked to: Return on a different day to have your blood pressure checked again. Monitor your blood pressure at home for 1 week or longer. If you are diagnosed with hypertension, you may have other blood or imaging tests to help your health care provider understand your overall risk for other conditions. How is this treated? This condition is treated by making healthy lifestyle changes, such as eating healthy foods, exercising more, and reducing your alcohol intake. You may be referred for counseling on a healthy diet and physical activity. Your health care provider may prescribe medicine if lifestyle changes are not enough to get your blood pressure under control and if: Your systolic blood pressure is above 130. Your diastolic blood pressure is above 80. Your personal target blood pressure may vary depending on your medical conditions, your age, and other factors. Follow these instructions at home: Eating and drinking  Eat a diet that is high in fiber and potassium, and low in sodium, added sugar, and  fat. An example of this eating plan is called the DASH diet. DASH stands for Dietary Approaches to Stop Hypertension. To eat this way: Eat plenty of fresh fruits and vegetables. Try to fill  one half of your plate at each meal with fruits and vegetables. Eat whole grains, such as whole-wheat pasta, brown rice, or whole-grain bread. Fill about one fourth of your plate with whole grains. Eat or drink low-fat dairy products, such as skim milk or low-fat yogurt. Avoid fatty cuts of meat, processed or cured meats, and poultry with skin. Fill about one fourth of your plate with lean proteins, such as fish, chicken without skin, beans, eggs, or tofu. Avoid pre-made and processed foods. These tend to be higher in sodium, added sugar, and fat. Reduce your daily sodium intake. Many people with hypertension should eat less than 1,500 mg of sodium a day. Do not drink alcohol if: Your health care provider tells you not to drink. You are pregnant, may be pregnant, or are planning to become pregnant. If you drink alcohol: Limit how much you have to: 0-1 drink a day for women. 0-2 drinks a day for men. Know how much alcohol is in your drink. In the [place] one drink equals one 12 oz bottle of beer (355 mL), one 5 oz glass of wine (148 mL), or one 1 oz glass of hard liquor (44 mL). Lifestyle  Work with your health care provider to maintain a healthy body weight or to lose weight. Ask what an ideal weight is for you. Get at least 30 minutes of exercise that causes your heart to beat faster (aerobic exercise) most days of the week. Activities may include walking, swimming, or biking. Include exercise to strengthen your muscles (resistance exercise), such as Pilates or lifting weights, as part of your weekly exercise routine. Try to do these types of exercises for 30 minutes at least 3 days a week. Do not use any products that contain nicotine or tobacco. These products include cigarettes, chewing tobacco, and vaping devices, such as e-cigarettes. If you need help quitting, ask your health care provider. Monitor your blood pressure at home as told by your health care provider. Keep all follow-up visits.  This is important. Medicines Take over-the-counter and prescription medicines only as told by your health care provider. Follow directions carefully. Blood pressure medicines must be taken as prescribed. Do not skip doses of blood pressure medicine. Doing this puts you at risk for problems and can make the medicine less effective. Ask your health care provider about side effects or reactions to medicines that you should watch for. Contact a health care provider if you: Think you are having a reaction to a medicine you are taking. Have headaches that keep coming back (recurring). Feel dizzy. Have swelling in your ankles. Have trouble with your vision. Get help right away if you: Develop a severe headache or confusion. Have unusual weakness or numbness. Feel faint. Have severe pain in your chest or abdomen. Vomit repeatedly. Have trouble breathing. These symptoms may be an emergency. Get help right away. Call 911. Do not wait to see if the symptoms will go away. Do not drive yourself to the hospital. Summary Hypertension is when the force of blood pumping through your arteries is too strong. If this condition is not controlled, it may put you at risk for serious complications. Your personal target blood pressure may vary depending on your medical conditions, your age, and other factors. For most people, a normal blood  pressure is less than 120/80. Hypertension is treated with lifestyle changes, medicines, or a combination of both. Lifestyle changes include losing weight, eating a healthy, low-sodium diet, exercising more, and limiting alcohol. This information is not intended to replace advice given to you by your health care provider. Make sure you discuss any questions you have with your health care provider. Document Revised: 11/23/2020 Document Reviewed: 11/23/2020 Elsevier Patient Education  [employer].

## 2021-09-26 NOTE — Progress Notes (Signed)
Subjective:    Patient ID: DANTE COOTER, female    DOB: [birth year], 76 y.o.   MRN: [id number]  Chief Complaint  Patient presents with   Follow-up    Here for 3 month follow up    HPI Patient is in today for follow-up on chronic medical concerns.  She is accompanied by her husband.  No recent febrile illness or hospitalizations.  She is acknowledging that her dementia is worsening.  She is forgetting simple tasks and there currently trying to figure out how the future she will book.  Thinking about selling their home and moving.  No other acute concerns and she is managing the stress of her diagnosis somewhat better with the addition of venlafaxine. Denies CP/palp/SOB/HA/congestion/fevers/GI or GU c/o. 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 Echo 2012: EF 45-50%;  b. Lex MV 2/12:  low risk, apical defect (small area of ischemia vs shifting breast atten);  c.  Echo 7/12: Normal wall thickness, EF 60-65%, normal wall motion, grade 1 diastolic dysfunction, mild LAE, PASP 32;   d. Lex MV 11/13:  EF 76%, no ischemia   OA (osteoarthritis) 09/07/2011   Obesity 09/28/2014   Osteoporosis 03/07/2011   DEXA T score -2.6 AP spine 03/07/11    Formatting of this note might be different from the original. Formatting of this note might be different from the original. DEXA T score -2.6 AP spine 03/07/11   Last Assessment & Plan:  Formatting of this note might be different from the original. Encouraged to get adequate exercise, calcium and vitamin d intake   Pain in joint, ankle and foot 09/28/2014   Palpitation 10/28/2020   Personal history of chemotherapy 2012   Personal history of radiation therapy 2012   Prediabetes 06/15/2009   Recurrent falls 12/28/2015   Right knee pain 07/10/2020   Shortness of breath    UTI (urinary tract infection) 03/03/2012   Vertebral fracture, osteoporotic, sequela 04/05/2016   Vitamin D deficiency 06/14/2017   Supplement and monitor    Past  Surgical History:  Procedure Laterality Date   BREAST LUMPECTOMY  04/2009   RIGHT FOR BREAST CANCER-CHEMO/RADIATION X 1 YEAR   BREAST REDUCTION SURGERY Bilateral 05/04/2014   Procedure: MAMMARY REDUCTION  (BREAST);  Surgeon: Cristine Polio, MD;  Location: [facility];  Service: Plastics;  Laterality: Bilateral;   CARPAL TUNNEL RELEASE     L wrist, ulnar nerve moved   CHOLECYSTECTOMY     CRANIOTOMY FOR TUMOR  2000   ELBOW SURGERY     left   KNEE ARTHROSCOPY Left 10/12/2014   Procedure: LEFT KNEE ARTHROSCOPY ;  Surgeon: Gaynelle Arabian, MD;  Location: [facility];  Service:  Orthopedics;  Laterality: Left;   KYPHOPLASTY N/A 03/30/2016   Procedure: THORACIC 12 KYPHOPLASTY;  Surgeon: Phylliss Bob, MD;  Location: [facility];  Service: Orthopedics;  Laterality: N/A;  THORACIC 12 KYPHOPLASTY   LEFT HEART CATHETERIZATION WITH CORONARY ANGIOGRAM N/A 12/16/2013   Procedure: LEFT HEART CATHETERIZATION WITH CORONARY ANGIOGRAM;  Surgeon: Peter M [name], MD;  Location: [facility] CATH LAB;  Service: Cardiovascular;  Laterality: N/A;   LIPOMA EXCISION  03/28/2009   right leg   MELANOMA EXCISION     PORT-A-CATH REMOVAL  11/30/2010   Streck   porta cath     PORTACATH PLACEMENT  may 2011   REDUCTION MAMMAPLASTY Bilateral    SYNOVECTOMY Left 10/12/2014   Procedure: WITH SYNOVECTOMY;  Surgeon: Gaynelle Arabian, MD;  Location: [facility];  Service: Orthopedics;  Laterality: Left;   TONSILLECTOMY  1958   TOTAL KNEE ARTHROPLASTY  02/05/2012   Procedure: TOTAL KNEE ARTHROPLASTY;  Surgeon: Gearlean Alf, MD;  Location: [facility];  Service: Orthopedics;  Laterality: Right;   TOTAL KNEE ARTHROPLASTY Left 02/10/2013   Procedure: LEFT TOTAL KNEE ARTHROPLASTY;  Surgeon: Gearlean Alf, MD;  Location: [facility];  Service: Orthopedics;  Laterality: Left;   total knee raplacement  01-2012   Right Knee   TOTAL KNEE REVISION Left 07/14/2020   Procedure: TOTAL KNEE REVISION;  Surgeon: Gaynelle Arabian, MD;  Location: [facility];  Service: Orthopedics;  Laterality: Left;   TUBAL LIGATION  1997    Family History  Problem Relation Age of Onset   Breast cancer Mother        sarcoma   Lung cancer Mother    Hypertension Mother    Prostate cancer Father    Congestive Heart Failure Father    Heart attack Father    Prostate cancer Brother    Heart disease Maternal Grandfather        MI   Down syndrome Son    CVA Son    Stomach cancer Maternal Aunt    Dementia Maternal Aunt    Uterine cancer Maternal Aunt    Colon cancer Neg Hx     Social History   Socioeconomic History   Marital status: Married    Spouse name:  Nariyah Osias   Number of children: 3   Years of education: 12   Highest education level: High school graduate  Occupational History   Occupation: boutique owner-retired    Employer: [employer]  Tobacco Use   Smoking status: Never    Passive exposure: Never   Smokeless tobacco: Never  Vaping Use   Vaping Use: Never used  Substance and Sexual Activity   Alcohol use: No   Drug use: No   Sexual activity: Not Currently  Other Topics Concern   Not on file  Social History Narrative   Patient is right-handed. She lives with her husband  in a 4 story home. They take care of their grown son with down syndrome who has had a stroke. She drinks 1-2 glasses of tea in the evenings. She does not formally exercise.      Social Determinants of Health   Financial Resource Strain: Low Risk  (07/25/2021)   Overall Financial Resource Strain (CARDIA)    Difficulty of Paying Living Expenses: Not very hard  Food Insecurity: No Food Insecurity (12/30/2020)   Hunger Vital Sign    Worried About Running Out of Food in the Last Year: Never true    Ran Out of Food in the Last Year: Never true  Transportation Needs: No Transportation Needs (07/25/2021)   PRAPARE - Hydrologist (Medical): No    Lack of Transportation (Non-Medical): No  Physical Activity: Inactive (12/30/2020)   Exercise Vital Sign    Days of Exercise per Week: 0 days    Minutes of Exercise per Session: 0 min  Stress: Stress Concern Present (07/25/2021)   [place]    Feeling of Stress : Rather much  Social Connections: Socially Integrated (12/30/2020)   Social Connection and Isolation Panel [NHANES]    Frequency of Communication with Friends and Family: More than three times a week    Frequency of Social Gatherings with Friends and Family: More than three times a week    Attends Religious Services: 1 to 4 times per year    Active Member of  [employer] or Organizations: Yes    Attends Archivist Meetings: 1 to 4 times per year    Marital Status: Married  Human resources officer Violence: Not At Risk (12/30/2020)   Humiliation, Afraid, Rape, and Kick questionnaire    Fear of Current or Ex-Partner: No    Emotionally Abused: No    Physically Abused: No    Sexually Abused: No    Outpatient Medications Prior to Visit  Medication Sig Dispense Refill   albuterol (VENTOLIN HFA) 108 (90 Base) MCG/ACT inhaler INHALE 1-2 PUFFS INTO THE LUNGS EVERY 6 (SIX) HOURS AS NEEDED FOR WHEEZING OR SHORTNESS OF BREATH. 18 each 3   Calcium Carbonate (CALCIUM 600 PO) Take 1 tablet by mouth in the morning and at bedtime.     carvedilol (COREG) 25 MG tablet TAKE 1 TABLET TWICE DAILY WITH A MEAL 180 tablet 3   cetirizine (ZYRTEC) 10 MG tablet Take 10 mg by mouth daily.     diltiazem (CARDIZEM CD) 120 MG 24 hr capsule TAKE 1 CAPSULE BY MOUTH EVERY DAY 90 capsule 2   docusate sodium (COLACE) 250 MG capsule Take 250 mg by mouth daily as needed for constipation.     donepezil (ARICEPT) 10 MG tablet TAKE 1 TABLET AT BEDTIME 90 tablet 1   Ergocalciferol 50 MCG (2000 UT) TABS Take 2,000 Units by mouth daily.     famotidine (PEPCID) 20 MG tablet TAKE 1 TABLET BY MOUTH TWICE A DAY (Patient taking differently: Take 20 mg by mouth at bedtime.) 180 tablet 1   ferrous sulfate 325 (65 FE) MG tablet Take 325 mg by mouth daily with breakfast.     folic acid (FOLVITE) 1 MG tablet Take 1 tablet (1 mg total) by mouth daily. 90 tablet 0   gabapentin (NEURONTIN) 300 MG capsule Take 1 capsule (300 mg total) by mouth 2 (two) times daily. 60 capsule 5   hydrOXYzine (ATARAX) 25 MG tablet TAKE 0.5-1 TABLETS (12.5-25 MG TOTAL) BY MOUTH 2 (  TWO) TIMES DAILY AS NEEDED. 180 tablet 0   Melatonin 10 MG CAPS Take 1 capsule by mouth at bedtime.     mirabegron ER (MYRBETRIQ) 25 MG TB24 tablet Take 1 tablet by mouth daily.     Multiple Vitamin (MULTIVITAMIN WITH MINERALS) TABS tablet Take 1  tablet by mouth daily.     omeprazole (PRILOSEC) 20 MG capsule Take 1 capsule (20 mg total) by mouth daily. 90 capsule 1   pentosan polysulfate (ELMIRON) 100 MG capsule Take 100 mg by mouth as directed. two at morning and two at night     rosuvastatin (CRESTOR) 20 MG tablet TAKE 1 TABLET AT BEDTIME 90 tablet 3   telmisartan (MICARDIS) 40 MG tablet Take 1 tablet (40 mg total) by mouth daily.     venlafaxine XR (EFFEXOR-XR) 75 MG 24 hr capsule TAKE 3 CAPSULES EVERY DAY WITH BREAKFAST 270 capsule 1   No facility-administered medications prior to visit.    Allergies  Allergen Reactions   Dilaudid [Hydromorphone Hcl] Itching    Review of Systems  Constitutional:  Positive for malaise/fatigue. Negative for fever.  HENT:  Negative for congestion.   Eyes:  Negative for blurred vision.  Respiratory:  Negative for shortness of breath.   Cardiovascular:  Negative for chest pain, palpitations and leg swelling.  Gastrointestinal:  Negative for abdominal pain, blood in stool and nausea.  Genitourinary:  Negative for dysuria and frequency.  Musculoskeletal:  Negative for falls.  Skin:  Negative for rash.  Neurological:  Negative for dizziness, loss of consciousness and headaches.  Endo/Heme/Allergies:  Negative for environmental allergies.  Psychiatric/Behavioral:  Positive for memory loss. Negative for depression. The patient is nervous/anxious.        Objective:    Physical Exam Constitutional:      General: She is not in acute distress.    Appearance: She is well-developed.  HENT:     Head: Normocephalic and atraumatic.  Eyes:     Conjunctiva/sclera: Conjunctivae normal.  Neck:     Thyroid: No thyromegaly.  Cardiovascular:     Rate and Rhythm: Normal rate and regular rhythm.     Heart sounds: Normal heart sounds. No murmur heard. Pulmonary:     Effort: Pulmonary effort is normal. No respiratory distress.     Breath sounds: Normal breath sounds.  Abdominal:     General: Bowel  sounds are normal. There is no distension.     Palpations: Abdomen is soft. There is no mass.     Tenderness: There is no abdominal tenderness.  Musculoskeletal:     Cervical back: Neck supple.  Lymphadenopathy:     Cervical: No cervical adenopathy.  Skin:    General: Skin is warm and dry.  Neurological:     Mental Status: She is alert and oriented to person, place, and time.  Psychiatric:        Behavior: Behavior normal.     BP 128/72 (BP Location: Right Arm, Patient Position: Sitting, Cuff Size: Normal)   Pulse 65   Temp 98.7 F (37.1 C) (Oral)   Resp 16   Ht $R'5\' 4"'TQ$  (1.626 m)   Wt 174 lb 6.4 oz (79.1 kg)   LMP 01/30/1994   SpO2 95%   BMI 29.94 kg/m  Wt Readings from Last 3 Encounters:  09/26/21 174 lb 6.4 oz (79.1 kg)  07/14/21 172 lb (78 kg)  06/23/21 169 lb 6.4 oz (76.8 kg)    Diabetic Foot Exam - Simple   No data filed  Lab Results  Component Value Date   WBC 6.9 06/23/2021   HGB 12.1 06/23/2021   HCT 36.7 06/23/2021   PLT 231.0 06/23/2021   GLUCOSE 84 06/23/2021   CHOL 167 06/23/2021   TRIG 236.0 (H) 06/23/2021   HDL 49.90 06/23/2021   LDLDIRECT 89.0 06/23/2021   LDLCALC 80 06/05/2018   ALT 19 06/23/2021   AST 19 06/23/2021   NA 144 06/23/2021   K 4.0 06/23/2021   CL 107 06/23/2021   CREATININE 0.93 06/23/2021   BUN 17 06/23/2021   CO2 31 06/23/2021   TSH 3.97 06/23/2021   INR 1.0 07/12/2020   HGBA1C 5.6 06/23/2021    Lab Results  Component Value Date   TSH 3.97 06/23/2021   Lab Results  Component Value Date   WBC 6.9 06/23/2021   HGB 12.1 06/23/2021   HCT 36.7 06/23/2021   MCV 89.8 06/23/2021   PLT 231.0 06/23/2021   Lab Results  Component Value Date   NA 144 06/23/2021   K 4.0 06/23/2021   CHLORIDE 107 08/07/2016   CO2 31 06/23/2021   GLUCOSE 84 06/23/2021   BUN 17 06/23/2021   CREATININE 0.93 06/23/2021   BILITOT 0.3 06/23/2021   ALKPHOS 113 06/23/2021   AST 19 06/23/2021   ALT 19 06/23/2021   PROT 6.1 06/23/2021    ALBUMIN 4.0 06/23/2021   CALCIUM 9.5 06/23/2021   ANIONGAP 5 07/16/2020   EGFR 72 (L) 08/07/2016   GFR 59.48 (L) 06/23/2021   Lab Results  Component Value Date   CHOL 167 06/23/2021   Lab Results  Component Value Date   HDL 49.90 06/23/2021   Lab Results  Component Value Date   LDLCALC 80 06/05/2018   Lab Results  Component Value Date   TRIG 236.0 (H) 06/23/2021   Lab Results  Component Value Date   CHOLHDL 3 06/23/2021   Lab Results  Component Value Date   HGBA1C 5.6 06/23/2021       Assessment & Plan:   Problem List Items Addressed This Visit     Hyperlipidemia, mixed - Primary   Relevant Orders   Lipid panel   Major depressive disorder with anxious distress    Responding to Venlafaxine continue the same      Essential hypertension    Well controlled, no changes to meds. Encouraged heart healthy diet such as the DASH diet and exercise as tolerated.       Relevant Orders   CBC   Comprehensive metabolic panel   TSH   Prediabetes   Relevant Orders   Hemoglobin A1c   Congestive dilated cardiomyopathy (HCC)    Continue to monitor, stable      Vitamin D deficiency    Supplement and monitor      Relevant Orders   VITAMIN D 25 Hydroxy (Vit-D Deficiency, Fractures)   Mild neurocognitive disorder due to Alzheimer's disease    Following with neurology, stable on current meds      Dementia [facility])    Is following with neurology now and acknowledges her dementia is progressing slightly. More trouble remembering all of her work around the house.        I am having Darrian Goodwill. Eroh maintain her pentosan polysulfate, ferrous sulfate, folic acid, cetirizine, multivitamin with minerals, Ergocalciferol, rosuvastatin, Melatonin, docusate sodium, Calcium Carbonate (CALCIUM 600 PO), telmisartan, mirabegron ER, carvedilol, gabapentin, albuterol, hydrOXYzine, diltiazem, famotidine, donepezil, venlafaxine XR, and omeprazole.  No orders of the defined types were placed  in this encounter.  Penni Homans, MD

## 2021-10-04 ENCOUNTER — Other Ambulatory Visit: Payer: Self-pay

## 2021-10-04 ENCOUNTER — Inpatient Hospital Stay: Payer: Medicare Other | Attending: Adult Health | Admitting: Adult Health

## 2021-10-04 VITALS — BP 152/69 | HR 77 | Temp 97.3°F | Resp 18 | Ht 64.0 in | Wt 173.1 lb

## 2021-10-04 DIAGNOSIS — Z853 Personal history of malignant neoplasm of breast: Secondary | ICD-10-CM | POA: Diagnosis not present

## 2021-10-04 DIAGNOSIS — Z171 Estrogen receptor negative status [ER-]: Secondary | ICD-10-CM

## 2021-10-04 DIAGNOSIS — C50311 Malignant neoplasm of lower-inner quadrant of right female breast: Secondary | ICD-10-CM

## 2021-10-04 DIAGNOSIS — M810 Age-related osteoporosis without current pathological fracture: Secondary | ICD-10-CM | POA: Insufficient documentation

## 2021-10-04 DIAGNOSIS — Z08 Encounter for follow-up examination after completed treatment for malignant neoplasm: Secondary | ICD-10-CM | POA: Diagnosis not present

## 2021-10-04 NOTE — Assessment & Plan Note (Signed)
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    Darrelle has no clinical or radiographic sign of breast cancer recurrence.  She is due for repeat mammogram next month and is scheduled for this.  We discussed her bone density and osteoporosis.  I recommended calcium, vitamin d and weight bearing exercises.    I reached out to her PCP to understand if the pharmacist on her care team can help her with her concern for polypharmacy.    Bernadene would like to return next year for continued f/u and surveillance which we are happy to accommodate.   

## 2021-10-04 NOTE — Progress Notes (Signed)
[facility] Follow up:    Shannon Lukes, [place] [address]   DIAGNOSIS:  Cancer Staging  Breast cancer of lower-inner quadrant of right female breast Staging form: Breast, AJCC 7th Edition - Clinical: Stage IA (T1c, N0, cM0) - Signed by Chauncey Cruel, MD on 10/21/2012   SUMMARY OF ONCOLOGIC HISTORY: Oncology History  Breast cancer of lower-inner quadrant of right female breast  04/2009 Initial Biopsy   Right breast core needle bx: IDC, ER/PR-, HER2/neu positive, Ki67 58%   04/2009 Clinical Stage   Stage IA: T1c No    - 08/2009 Neo-Adjuvant Chemotherapy   Taxol/ carbo/trastuzumab x 2, then Taxol/trastuzumab weekly x 3 (with last cycle with weekly carbo), A/C x 2 cycles with doxorubicin by continuous infusion (chemo poorly tolerated)   09/2009 Definitive Surgery   Right lumpectomy/SLNB: complete pathologic response    - 11/2009 Radiation Therapy   Adjuvant RT: right breast   09/2009 - 01/2010 Chemotherapy   Maintenance trastuzumab; discontinued due to drop in EF, which has since respolved     CURRENT THERAPY: observation  INTERVAL HISTORY: Shannon [name] 76 y.o. female returns for follow up and continued observation of Shannon [name] history of breast cancer.  Shannon [name] is doing well today.  Shannon [name] tells me that Shannon [name] health has been good from a breast perspective and Shannon [name] most recent mammogram in 10/2020 showed no evidence of malignancy.    Shannon [name] notes that Shannon [name] is taking a high number of pills, and wants to stop taking some if possible and wants to know who Shannon [name] could talk to about that.    Shannon [name] is active as Shannon [name] is able.  Shannon [name] most recent bone density testing was completed on 09/23/2021 and demonstrated osteoporosis with a  t score of -3.0 in Shannon [name] right femur.  Shannon [name] is taking calcium and vitamin d.     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   Right knee pain 07/10/2020   Urinary frequency 02/11/2020   Debility 12/23/2017   Left knee pain 12/23/2017   Vitamin D deficiency 06/14/2017   Vertebral fracture, osteoporotic 04/05/2016   Left-sided back pain 03/06/2016   Chest pain 02/27/2016   Syncope 02/27/2016   Cataracts, bilateral 12/28/2015   Recurrent falls 12/28/2015   Family history- stomach cancer 09/08/2015   Family history of cancer 09/08/2015   [name] lesion 06/21/2015   Obesity 09/28/2014   Interstitial cystitis 09/28/2014   Pain in joint, ankle and foot 09/28/2014   Abnormal nuclear stress test 12/16/2013   Sternal pain 11/06/2013   Postoperative anemia due to acute blood loss 02/13/2013   Congestive dilated cardiomyopathy ([place]) 12/30/2012   Breast cancer of lower-inner quadrant of right female breast 10/21/2012   Constipation 03/03/2012   OA (osteoarthritis) 09/07/2011   Dermatitis    Hyponatremia 08/28/2011   Osteoporosis 03/07/2011   Prediabetes 06/15/2009   Lipoma 02/10/2009   Dyspnea on exertion 02/10/2009   Hyperlipidemia, mixed 08/11/2008   Major depressive disorder with anxious distress 08/11/2008   Essential hypertension 08/11/2008   GERD (gastroesophageal reflux disease) 08/11/2008   Craniopharyngioma 2000    is allergic to dilaudid [hydromorphone hcl].  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 Echo 2012: EF 45-50%;  b. Lex MV 2/12:  low risk, apical defect (small area of ischemia vs shifting breast atten);  c.  Echo 7/12: Normal wall thickness, EF 60-65%, normal wall motion, grade 1 diastolic dysfunction, mild LAE, PASP 32;   d. Lex MV 11/13:  EF 76%, no ischemia   OA (osteoarthritis) 09/07/2011   Obesity 09/28/2014   Osteoporosis 03/07/2011   DEXA T score -2.6 AP spine 03/07/11    Formatting of this note might be different from Shannon original. Formatting of this note might be different from Shannon original. DEXA T score -2.6 AP spine 03/07/11   Last Assessment & Plan:  Formatting of this note might be different from Shannon original. Encouraged to get adequate exercise, calcium and vitamin d intake   Pain in joint, ankle and foot 09/28/2014   Palpitation 10/28/2020   Personal history of chemotherapy 2012   Personal history of radiation therapy 2012   Prediabetes 06/15/2009   Recurrent falls 12/28/2015   Right knee pain 07/10/2020   Shortness of breath    UTI (urinary tract infection)  03/03/2012   Vertebral fracture, osteoporotic, sequela 04/05/2016   Vitamin D deficiency 06/14/2017   Supplement and monitor    SURGICAL HISTORY: Past Surgical History:  Procedure Laterality Date   BREAST LUMPECTOMY  04/2009   RIGHT FOR BREAST CANCER-CHEMO/RADIATION X 1 YEAR   BREAST REDUCTION SURGERY Bilateral 05/04/2014   Procedure: MAMMARY REDUCTION  (BREAST);  Surgeon: Cristine Polio, MD;  Location: [facility];  Service: Plastics;  Laterality: Bilateral;   CARPAL TUNNEL RELEASE     L wrist, ulnar nerve moved   CHOLECYSTECTOMY     CRANIOTOMY FOR TUMOR  2000   ELBOW SURGERY     left   KNEE ARTHROSCOPY Left 10/12/2014   Procedure: LEFT KNEE ARTHROSCOPY ;  Surgeon: Gaynelle Arabian, MD;  Location: [facility];  Service: Orthopedics;  Laterality: Left;   KYPHOPLASTY N/A 03/30/2016   Procedure: THORACIC 12 KYPHOPLASTY;  Surgeon: Phylliss Bob, MD;  Location: [facility];  Service: Orthopedics;  Laterality: N/A;  THORACIC 12 KYPHOPLASTY   LEFT HEART CATHETERIZATION WITH CORONARY ANGIOGRAM N/A 12/16/2013   Procedure: LEFT HEART CATHETERIZATION WITH CORONARY ANGIOGRAM;  Surgeon: Peter M [name], MD;  Location: [facility];  Service: Cardiovascular;  Laterality: N/A;   LIPOMA EXCISION  03/28/2009   right leg   MELANOMA EXCISION     PORT-A-CATH REMOVAL  11/30/2010   [facility]   porta cath     PORTACATH PLACEMENT  may 2011   REDUCTION MAMMAPLASTY Bilateral    SYNOVECTOMY Left 10/12/2014   Procedure: WITH SYNOVECTOMY;  Surgeon: Gaynelle Arabian, MD;  Location: [facility];  Service: Orthopedics;  Laterality: Left;   TONSILLECTOMY  1958   TOTAL KNEE ARTHROPLASTY  02/05/2012   Procedure: TOTAL KNEE ARTHROPLASTY;  Surgeon: Gearlean Alf, MD;  Location: [facility];  Service: Orthopedics;  Laterality: Right;   TOTAL KNEE ARTHROPLASTY Left 02/10/2013   Procedure: LEFT TOTAL KNEE ARTHROPLASTY;  Surgeon: Gearlean Alf, MD;  Location: [facility];  Service: Orthopedics;  Laterality: Left;   total knee raplacement   01-2012   Right Knee   TOTAL KNEE REVISION Left 07/14/2020   Procedure: TOTAL KNEE REVISION;  Surgeon: Gaynelle Arabian, MD;  Location: [facility];  Service: Orthopedics;  Laterality: Left;   TUBAL LIGATION  1997    SOCIAL HISTORY: Social History   Socioeconomic History   Marital status: Married    Spouse name: Lujean Ebright   Number of children: 3   Years of education: 12   Highest education level: High school graduate  Occupational History   Occupation: boutique owner-retired    Employer: [employer]  Tobacco Use   Smoking status: Never    Passive exposure: Never   Smokeless tobacco: Never  Vaping Use   Vaping Use: Never used  Substance and Sexual Activity   Alcohol use: No   Drug use: No   Sexual activity: Not Currently  Other Topics Concern   Not on file  Social History Narrative   [name] is right-handed. Shannon [name] lives with Shannon [name] husband in a 4 story home. They take care of their grown son with down syndrome who has had a stroke. Shannon [name] drinks 1-2 glasses of tea in Shannon evenings. Shannon [name] does not formally exercise.      Social Determinants of Health   Financial Resource Strain: Low Risk  (07/25/2021)   Overall Financial Resource Strain (CARDIA)    Difficulty of Paying Living Expenses: Not very hard  Food Insecurity: No Food Insecurity (12/30/2020)   Hunger Vital Sign    Worried About Running Out of Food in [place] Last Year: Never true    Ran Out of Food in Shannon Last Year: Never true  Transportation Needs: No Transportation Needs (07/25/2021)   PRAPARE - Hydrologist (Medical): No    Lack of Transportation (Non-Medical): No  Physical Activity: Inactive (12/30/2020)   Exercise Vital Sign    Days of Exercise per Week: 0 days    Minutes of Exercise per Session: 0 min  Stress: Stress Concern Present (07/25/2021)   [place]    Feeling of Stress : Rather much  Social Connections: Socially  Integrated (12/30/2020)   Social Connection and Isolation Panel [NHANES]    Frequency of Communication with Friends and Family: More than three times a week    Frequency of Social Gatherings with Friends and Family: More than three times a week    Attends Religious  Services: 1 to 4 times per year    Active Member of Clubs or Organizations: Yes    Attends Archivist Meetings: 1 to 4 times per year    Marital Status: Married  Human resources officer Violence: Not At Risk (12/30/2020)   Humiliation, Afraid, Rape, and Kick questionnaire    Fear of Current or Ex-Partner: No    Emotionally Abused: No    Physically Abused: No    Sexually Abused: No    FAMILY HISTORY: Family History  Problem Relation Age of Onset   Breast cancer Mother        sarcoma   Lung cancer Mother    Hypertension Mother    Prostate cancer Father    Congestive Heart Failure Father    Heart attack Father    Prostate cancer Brother    Heart disease Maternal Grandfather        MI   Down syndrome Son    CVA Son    Stomach cancer Maternal Aunt    Dementia Maternal Aunt    Uterine cancer Maternal Aunt    Colon cancer Neg Hx     Review of Systems  Constitutional:  Negative for appetite change, chills, fatigue, fever and unexpected weight change.  HENT:   Negative for hearing loss, lump/mass and trouble swallowing.   Eyes:  Negative for eye problems and icterus.  Respiratory:  Negative for chest tightness, cough and shortness of breath.   Cardiovascular:  Negative for chest pain, leg swelling and palpitations.  Gastrointestinal:  Negative for abdominal distention, abdominal pain, constipation, diarrhea, nausea and vomiting.  Endocrine: Negative for hot flashes.  Genitourinary:  Negative for difficulty urinating.   Musculoskeletal:  Negative for arthralgias.  Skin:  Negative for itching and rash.  Neurological:  Negative for dizziness, extremity weakness, headaches and numbness.  Hematological:  Negative for  adenopathy. Does not bruise/bleed easily.  Psychiatric/Behavioral:  Negative for depression. Shannon [name] is not nervous/anxious.       PHYSICAL EXAMINATION  ECOG PERFORMANCE STATUS: 1 - Symptomatic but completely ambulatory  Vitals:   10/04/21 1338  BP: (!) 152/69  Pulse: 77  Resp: 18  Temp: (!) 97.3 F (36.3 C)  SpO2: 97%    Physical Exam Constitutional:      General: Shannon [name] is not in acute distress.    Appearance: Normal appearance. Shannon [name] is not toxic-appearing.  HENT:     Head: Normocephalic and atraumatic.  Eyes:     General: No scleral icterus. Cardiovascular:     Rate and Rhythm: Normal rate and regular rhythm.     Pulses: Normal pulses.     Heart sounds: Normal heart sounds.  Pulmonary:     Effort: Pulmonary effort is normal.     Breath sounds: Normal breath sounds.  Chest:     Comments: Right breast s/p lumpectomy and radiation, no sign of local recurrence, left breast benign.  Abdominal:     General: Abdomen is flat. Bowel sounds are normal. There is no distension.     Palpations: Abdomen is soft.     Tenderness: There is no abdominal tenderness.  Musculoskeletal:        General: No swelling.     Cervical back: Neck supple.  Lymphadenopathy:     Cervical: No cervical adenopathy.  Skin:    General: Skin is warm and dry.     Findings: No rash.  Neurological:     General: No focal deficit present.     Mental Status: Shannon [name] is  alert.  Psychiatric:        Mood and Affect: Mood normal.        Behavior: Behavior normal.     LABORATORY DATA:  None for this visit.    ASSESSMENT and THERAPY PLAN:   Breast cancer of lower-inner quadrant of right female breast Hevin is a 76 year old woman with h/o right sided stage IA HER2 positive breast cancer diagnosed in 04/2009 s/p neoadjuvant chemotherapy, lumpectomy, adjuvant chemotherapy and maintenance trastuzumab that completed in 01/2010.    Ashanti has no clinical or radiographic sign of breast cancer recurrence.  Shannon [name]  is due for repeat mammogram next month and is scheduled for this.  We discussed Shannon [name] bone density and osteoporosis.  Shannon [name] recommended calcium, vitamin d and weight bearing exercises.    Shannon [name] reached out to Shannon [name] PCP to understand if Shannon pharmacist on Shannon [name] care team can help Shannon [name] with Shannon [name] concern for polypharmacy.    Dorian would like to return next year for continued f/u and surveillance which we are happy to accommodate.     All questions were answered. Shannon [name] knows to call Shannon clinic with any problems, questions or concerns. We can certainly see Shannon [name] much sooner if necessary.  Total encounter time:20 minutes*in face-to-face visit time, chart review, lab review, care coordination, order entry, and documentation of Shannon encounter time.    Wilber Bihari, NP 10/04/21 8:48 PM Medical Oncology and Hematology [facility] [place], [place] [address] Tel. [phone]    Fax. [phone]  *Total Encounter Time as defined by [place] [facility] includes, in addition to Shannon face-to-face time of a [name] visit (documented in Shannon note above) non-face-to-face time: obtaining and reviewing outside history, ordering and reviewing medications, tests or procedures, care coordination (communications with other health care professionals or caregivers) and documentation in Shannon medical record.

## 2021-10-10 ENCOUNTER — Ambulatory Visit (INDEPENDENT_AMBULATORY_CARE_PROVIDER_SITE_OTHER): Payer: Medicare Other | Admitting: Pharmacist

## 2021-10-10 DIAGNOSIS — E782 Mixed hyperlipidemia: Secondary | ICD-10-CM

## 2021-10-10 DIAGNOSIS — N3010 Interstitial cystitis (chronic) without hematuria: Secondary | ICD-10-CM

## 2021-10-10 DIAGNOSIS — F0670 Mild neurocognitive disorder due to known physiological condition without behavioral disturbance: Secondary | ICD-10-CM

## 2021-10-10 DIAGNOSIS — I10 Essential (primary) hypertension: Secondary | ICD-10-CM

## 2021-10-10 DIAGNOSIS — I420 Dilated cardiomyopathy: Secondary | ICD-10-CM

## 2021-10-10 DIAGNOSIS — M810 Age-related osteoporosis without current pathological fracture: Secondary | ICD-10-CM

## 2021-10-10 NOTE — Chronic Care Management (AMB) (Unsigned)
Chronic Care Management Pharmacy Note  07/25/2021 Name:  Shannon [name] MRN:  [id number] DOB:  [birth year]  Summary: Patient states her goal today is to take fewer medications. Reviewed each medication on her med list and its purpose. Recommended the following medications she could possibly taper off of / stop:  Omeprazole 57m daily. Patient will hold omeprazole but continue famotidine 269mat bedtime.  Instructed to monitor increase in acid reflux / heartburn.  Melatonin. Patient reports she sleeps 7 to 8 hours per night. She does not feel that she needs melatonin. She will hold and monitor for changes in sleep duration or quality. Can restart melatonin if any changes.  Folic Acid - discussed possibly stopping this but might have benefit for cognitive function so will continue for now  Hydroxyzine 2567m Patient doesn't feel like it is effective. Usually helps with histamine which is thought to be part of interstitial cystitis process. Recommended she could taper by taking 0.5 tablet once a day for 1 week, if no changes in bladder symptoms you can try stopping.  If you experience any changes in bladder symptoms - pain or discomfort, then restart hydroxyzine.   Reminded patient to complete application for Elimiron patient assistance program. Removed Myrbetriq from med list - patient stopped due to low efficacy and cost.   Patient again asked about Leqembi - new medication to treat Alzheimer's disease / mild dementia.  Answered patient's questions about newly approved Leqembi. Dosing is 100m71m infusion over 1 hours every 2 weeks. (Patient thought was pill or subcutaneously injection that her local pharmacist could administer). Cost is also a barrier (About $26,000 per year and now is likely covered by Medicare medical benefits). Patient would also need evaluation to determine that she has amyloid plaque type Alzheimer's / mild dementia and have MRI prior to starting Leqembi and at intervals during  therapy to monitor for swelling and other potential side effects.  She would need to find a neurologist that prescribes Leqemib.   Noted patient has taken Prolia in past but last dose was 2018. Last BMD from 09/23/2021 showed decreased in T-Score at right femur neck at -3.0 (previously was -2.4 in 2019). Will check with PCP about possibly restarting Prolia.   Subjective: Shannon DUKESan 76 y61. year old female who is a primary patient of BlytMosie Lukes.  The CCM team was consulted for assistance with disease management and care coordination needs.    Engaged with patient by telephone for follow up visit in response to provider referral for pharmacy case management and/or care coordination services.   Consent to Services:  The patient was given information about Chronic Care Management services, agreed to services, and gave verbal consent prior to initiation of services.  Please see initial visit note for detailed documentation.   Patient Care Team: BlytMosie Lukes as PCP - General (Family Medicine) CrenStanford BreedaDenice Bors as PCP - Cardiology (Cardiology) EvanDomingo Pulse as Consulting Physician (Urology) DigbCalvert Cantor as Consulting Physician (Ophthalmology) CrenStanford BreedaDenice Bors as Consulting Physician (Cardiology) EckaCherre RobinsH-Jumpertownarmacist) JaffPieter Partridge as Consulting Physician (Neurology) BaueBarrie FolkSW as Social Worker (Psychology)  Recent Office Visit:  09/26/2021 - Fam Med (Dr BlytCharlett BlakeU chronic conditions. No med changes noted. Labs ordered but not drawn yet.  06/23/2021 - Fam Med (Dr BlytCharlett BlakeU chronic conditions. Labs checked. No med changes.  06/21/2021 - Fam Med (BecOlevia Bowens) Seen for  mild neurocognitive disorder. Checked urinalysis. No med changes.  03/01/2021 - Fam Med (Dr Charlett Blake) Follow up chronic medical conditions. Still endorses anhedonia and increased anxiety despite the increase in venlafaxine. Increased venlafaxine to 235m daily (3  tablets of 782m 01/12/2021 - Fam Med (Dr BlCharlett Blakefollow up. methocarbamol removed from med list - due to patient not taking.   Recent Consults: 10/04/2021 - Oncology (Cause, NP) F/U breast cancer. Diagnosed in 2014. Currently in observation. Recommended calcium and vitamin D. F/U 1 year. 09/12/2021 - Urology (LHanley SeamenFNP) F/U Interstitial cystitis. Recommended continue  gabapentin (NEURONTIN) 300 MG capsule, pentosan polysulfate sodium (ELMIRON) 100 mg capsule. OK to discontinue Myrbetriq. Refills sent for Gabapentin and Elmiron. F/U in 6 months or sooner if needed.  07/14/2021 - Neuro (WBurlene ArntNP) f/u Alzheimer's disease. No med changes.  05/18/2021 - Cardio (Dr KeClaiborne BillingsSeen for new sleep evaluation. Patient has CPAP but low adherence noted. Discussed importance of using CPAP and benefits. F/U 1 year.  04/21/2021 - TeMathis Dad counselor 04/12/2021 - Dermatology (Dr JoWilhemina Bonito03/13/2023 - Optometry (Dr HuLaban Emperorseen for mechanical ptosis of eyelids, bilaterally   Objective:  Lab Results  Component Value Date   CREATININE 0.93 06/23/2021   CREATININE 0.82 10/28/2020   CREATININE 0.90 09/21/2020    Lab Results  Component Value Date   HGBA1C 5.6 06/23/2021   Last diabetic Eye exam: No results found for: "HMDIABEYEEXA"  Last diabetic Foot exam: No results found for: "HMDIABFOOTEX"      Component Value Date/Time   CHOL 167 06/23/2021 0924   TRIG 236.0 (H) 06/23/2021 0924   HDL 49.90 06/23/2021 0924   CHOLHDL 3 06/23/2021 0924   VLDL 47.2 (H) 06/23/2021 0924   LDLCALC 80 06/05/2018 1215   LDLDIRECT 89.0 06/23/2021 0924       Latest Ref Rng & Units 06/23/2021    9:24 AM 01/18/2021    4:39 PM 10/28/2020   12:14 PM  Hepatic Function  Total Protein 6.0 - 8.3 g/dL 6.1  6.3  6.3   Albumin 3.5 - 5.2 g/dL 4.0   4.0   AST 0 - 37 U/L 19   21   ALT 0 - 35 U/L 19   19   Alk Phosphatase 39 - 117 U/L 113   107   Total Bilirubin 0.2 - 1.2 mg/dL 0.3   0.3     Lab Results  Component  Value Date/Time   TSH 3.97 06/23/2021 09:24 AM   TSH 3.11 10/28/2020 12:14 PM       Latest Ref Rng & Units 06/23/2021    9:24 AM 01/18/2021    4:09 PM 10/28/2020   12:14 PM  CBC  WBC 4.0 - 10.5 K/uL 6.9  5.2  7.4   Hemoglobin 12.0 - 15.0 g/dL 12.1  11.4  11.4   Hematocrit 36.0 - 46.0 % 36.7  34.8  35.8   Platelets 150.0 - 400.0 K/uL 231.0  198.0  230.0     Lab Results  Component Value Date/Time   VD25OH 45.18 06/23/2021 09:24 AM   VD25OH 51.73 01/18/2021 04:09 PM    Clinical ASCVD: Yes  The 10-year ASCVD risk score (Arnett DK, et al., 2019) is: 33.9%   Values used to calculate the score:     Age: [age]ears     Sex: Female     Is Non-Hispanic African American: No     Diabetic: No     Tobacco smoker: No     Systolic Blood Pressure: 15161  mmHg     Is BP treated: Yes     HDL Cholesterol: 49.9 mg/dL     Total Cholesterol: 167 mg/dL    Other:  DEXA 09/23/2020 AP Spine L3-L4 09/23/2020  Normal -0.8  AP Spine L3-L4 01/10/2018 Normal -0.6    DualFemur Neck Right 09/23/2020 Osteoporosis -3.0  DualFemur Neck Right 01/10/2018 Osteopenia -2.4    DualFemur Total Mean 09/23/2020 Osteopenia -2.0  DualFemur Total Mean 01/10/2018 Osteopenia -1.5    Left Forearm Radius 33% 09/23/2020 76.2 Osteopenia -1.8  Left Forearm Radius 33% 01/10/2018 73.5 Osteopenia -1.4   Social History   Tobacco Use  Smoking Status Never   Passive exposure: Never  Smokeless Tobacco Never   BP Readings from Last 3 Encounters:  10/04/21 (!) 152/69  09/26/21 128/72  07/14/21 133/78   Pulse Readings from Last 3 Encounters:  10/04/21 77  09/26/21 65  07/14/21 75   Wt Readings from Last 3 Encounters:  10/04/21 173 lb 1.6 oz (78.5 kg)  09/26/21 174 lb 6.4 oz (79.1 kg)  07/14/21 172 lb (78 kg)    Assessment: Review of patient past medical history, allergies, medications, health status, including review of consultants reports, laboratory and other test data, was performed as part of comprehensive  evaluation and provision of chronic care management services.   SDOH:  (Social Determinants of Health) assessments and interventions performed:  SDOH Interventions    Flowsheet Row Chronic Care Management from 07/25/2021 in [facility] [place] Management from 12/30/2020 in [place] [facility] [place] Management from 12/14/2020 in [place] [facility] [place] Management from 12/13/2020 in [place] [facility] [place] from 02/06/2019 in [place] at [place] Interventions       Food Insecurity Interventions -- Intervention Not Indicated -- Intervention Not Indicated --  Housing Interventions -- Intervention Not Indicated -- -- --  Transportation Interventions Intervention Not Indicated Intervention Not Indicated -- Intervention Not Indicated --  Depression Interventions/Treatment  -- Referral to Psychiatry, Counseling, Currently on Treatment, Medication -- Currently on Treatment Counseling, Currently on Treatment  Financial Strain Interventions Other (Comment)  [MAP application for Elmiron as been sent to patient in early 2023. She just needs to complete tax return prior to completing application.] Intervention Not Indicated Other (Comment)  [assisting patient in applying for Elimiron patient assistance.] -- --  Physical Activity Interventions -- Intervention Not Indicated -- -- --  Stress Interventions Provide Counseling  [patient is seeing Education officer, museum for stress management] Provide Counseling -- -- --  Social Connections Interventions -- Intervention Not Indicated -- -- --        CCM Care Plan  Allergies  Allergen Reactions   Dilaudid [Hydromorphone Hcl] Itching    Medications Reviewed Today     Reviewed by Mosie Lukes, MD (Physician) on 09/26/21 at 1245  Med List Status: <None>    Medication Order Taking? Sig Documenting Provider Last Dose Status Informant  albuterol (VENTOLIN HFA) 108 (90 Base) MCG/ACT inhaler [id number] Yes INHALE 1-2 PUFFS INTO THE LUNGS EVERY 6 (SIX) HOURS AS NEEDED FOR WHEEZING OR SHORTNESS OF BREATH. Mosie Lukes, MD Taking Active   Calcium Carbonate (CALCIUM 600 PO) [id number] Yes Take 1 tablet by mouth in the morning and at bedtime. [provider] Taking Active Self  carvedilol (COREG) 25 MG tablet [id number] Yes TAKE 1 TABLET TWICE DAILY WITH  A MEAL Crenshaw, Denice Bors, MD Taking Active   cetirizine (ZYRTEC) 10 MG tablet [id number] Yes Take 10 mg by mouth daily. [provider] Taking Active Self  diltiazem (CARDIZEM CD) 120 MG 24 hr capsule [id number] Yes TAKE 1 CAPSULE BY MOUTH EVERY DAY Crenshaw, Denice Bors, MD Taking Active   docusate sodium (COLACE) 250 MG capsule [id number] Yes Take 250 mg by mouth daily as needed for constipation. [provider] Taking Active Self  donepezil (ARICEPT) 10 MG tablet [id number] Yes TAKE 1 TABLET AT BEDTIME Elease Hashimoto Taking Active   Ergocalciferol 50 MCG (2000 UT) TABS [id number] Yes Take 2,000 Units by mouth daily. [provider] Taking Active   famotidine (PEPCID) 20 MG tablet [id number] Yes TAKE 1 TABLET BY MOUTH TWICE A DAY  Patient taking differently: Take 20 mg by mouth at bedtime.   Mosie Lukes, MD Taking Active   ferrous sulfate 325 (65 FE) MG tablet [id number] Yes Take 325 mg by mouth daily with breakfast. [provider] Taking Active Self  folic acid (FOLVITE) 1 MG tablet [id number] Yes Take 1 tablet (1 mg total) by mouth daily. Mosie Lukes, MD Taking Active   gabapentin (NEURONTIN) 300 MG capsule [id number] Yes Take 1 capsule (300 mg total) by mouth 2 (two) times daily. Pieter Partridge, DO Taking Active   hydrOXYzine (ATARAX) 25 MG tablet [id number] Yes TAKE 0.5-1 TABLETS (12.5-25 MG TOTAL) BY MOUTH 2 (TWO) TIMES DAILY AS NEEDED. Mosie Lukes, MD  Taking Active   Melatonin 10 MG CAPS [id number] Yes Take 1 capsule by mouth at bedtime. [provider] Taking Active Self  mirabegron ER (MYRBETRIQ) 25 MG TB24 tablet [id number] Yes Take 1 tablet by mouth daily. [provider] Taking Active   Multiple Vitamin (MULTIVITAMIN WITH MINERALS) TABS tablet [id number] Yes Take 1 tablet by mouth daily. [provider] Taking Active   omeprazole (PRILOSEC) 20 MG capsule [id number] Yes Take 1 capsule (20 mg total) by mouth daily. Mosie Lukes, MD Taking Active   pentosan polysulfate (ELMIRON) 100 MG capsule [id number] Yes Take 100 mg by mouth as directed. two at morning and two at night Dara Lords, Alexzandrew L, PA-C Taking Active Self  rosuvastatin (CRESTOR) 20 MG tablet [id number] Yes TAKE 1 TABLET AT BEDTIME Lelon Perla, MD Taking Active   telmisartan (MICARDIS) 40 MG tablet [id number] Yes Take 1 tablet (40 mg total) by mouth daily. 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   Right knee pain 07/10/2020   Urinary frequency 02/11/2020   Debility 12/23/2017   Left knee pain 12/23/2017   Vitamin D deficiency 06/14/2017   Vertebral fracture, osteoporotic 04/05/2016   Left-sided back pain 03/06/2016   Chest pain 02/27/2016   Syncope 02/27/2016   Cataracts, bilateral 12/28/2015   Recurrent falls 12/28/2015   Family history- stomach cancer 09/08/2015   Family history of cancer 09/08/2015   [name] lesion 06/21/2015   Obesity 09/28/2014   Interstitial  cystitis  09/28/2014   Pain in joint, ankle and foot 09/28/2014   Abnormal nuclear stress test 12/16/2013   Sternal pain 11/06/2013   Postoperative anemia due to acute blood loss 02/13/2013   Congestive dilated cardiomyopathy ([place]) 12/30/2012   Breast cancer of lower-inner quadrant of right female breast 10/21/2012   Constipation 03/03/2012   OA (osteoarthritis) 09/07/2011   Dermatitis    Hyponatremia 08/28/2011   Osteoporosis 03/07/2011   Prediabetes 06/15/2009   Lipoma 02/10/2009   Dyspnea on exertion 02/10/2009   Hyperlipidemia, mixed 08/11/2008   Major depressive disorder with anxious distress 08/11/2008   Essential hypertension 08/11/2008   GERD (gastroesophageal reflux disease) 08/11/2008   Craniopharyngioma 2000    Immunization History  Administered Date(s) Administered   Fluad Quad(high Dose 65+) 10/14/2018, 12/02/2020   Influenza Split 11/11/2010, 11/14/2011   Influenza Whole 02/10/2009   Influenza, High Dose Seasonal PF 11/04/2015, 10/16/2017, 11/20/2019   Influenza,inj,Quad PF,6+ Mos 10/18/2012, 10/10/2013, 12/21/2014   Influenza-Unspecified 08/30/2016   Moderna Sars-Covid-2 Vaccination 03/30/2019, 04/29/2019   PFIZER Comirnaty(Gray Top)Covid-19 Tri-Sucrose Vaccine 07/05/2020   PFIZER(Purple Top)SARS-COV-2 Vaccination 03/30/2019, 04/29/2019, 11/20/2019   Pfizer Covid-19 Vaccine Bivalent Booster 45yr & up 10/28/2020   Pneumococcal Conjugate-13 12/21/2014   Pneumococcal Polysaccharide-23 09/04/2011   Td 01/30/2001, 12/28/2015   Zoster Recombinat (Shingrix) 05/09/2016    Conditions to be addressed/monitored: HTN, HLD, Anxiety, Depression, Dementia, GERD, Osteoporosis, and interstitial cystitis  Care Plan : General Pharmacy (Adult)  Updates made by ECherre Robins RPH-CPP since 10/12/2021 12:00 AM     Problem: Hyperlipidemia; hypertenstion; interstitial cystitis; osteopenia; tachycardia; GERD; alzheimers disease; depression / anxiety; pre diabetes; cardiomyopathy;  history of breast cancer; osteoarthritis      Long-Range Goal: Provide education, support and care coordination for medication therapy and chronic conditions   Start Date: 12/14/2020  Recent Progress: On track  Priority: High  Note:   Current Barriers:  Unable to independently afford treatment regimen Unable to achieve control of hyperlipidemia or hypertension  Cognitive changes  Pharmacist Clinical Goal(s):  Over the next 90 days, patient will verbalize ability to afford treatment regimen achieve control of hyperlipidemia and hypertension  as evidenced by LDL <70 and blood pressure <140/90 adhere to prescribed medication regimen as evidenced by refill history  through collaboration with PharmD and provider.  Patient would like to decrease number of medications she is taking.   Interventions: 1:1 collaboration with BMosie Lukes MD regarding development and update of comprehensive plan of care as evidenced by provider attestation and co-signature Inter-disciplinary care team collaboration (see longitudinal plan of care) Comprehensive medication review performed; medication list updated in electronic medical record  Hypertension / Tachycardia Improving; blood pressure goal <140/90 Current treatment: Carvedilol 235mtwice a day (also helps with heart rate) Diltiazem 12069maily (also helps with heart rate)  Telmisartan 66m30mily Current home readings: mostly 130's / 75 to 85 Denies hypotensive/hypertensive symptoms Patient has started using Kardia (Alivecor) card to monitor HR as recommended by Dr CrenStanford Breednterventions:  Discussed blood pressure goal and importance of blood pressure control in heart and kidney health.  Discussed when was best time to take medications for blood pressure  Continue current blood pressure regimen   Hyperlipidemia, mixed: Uncontrolled but close to goal; LDL goal <70; Tg < 150 Current treatment:  Rosuvastatin 20mg59mly at bedtime Current  dietary patterns: not following any particular diet.  Interventions:  Discussed limiting intake of saturated and trans fat - follow heart health diet.  Discussed exercise - patient to start walking in  park or going to her son's home gym Consider adding ezetimibe 19m in future if LDL continues to be > 70  Interstitial Cystitis[facility] EAmalia Haileyand Dr SChauncey Cruel [name]  Currently stable, but notes that cost of Elmiron is high. Patient was denied patient assistance program in 2022 due to selling property in 2021.   Current therapy:  Elmiron 1054mtwice a day Hydroxyzine 2523m take 12.5 to 110m54m to twice a day if needed.  Myrbetriq 110mg35mly  Screened for Myrbetriq patient assistance program however patient does not qualify. Patient stopped Myrbetriq with Ok from urology office.  Interventions:  Patient will completed tax return for 2022 and then complete 2023 patient assistance program application for Elmiron. (10/10/2021 - patient reports she is still working application) Removed Myrbetriq from medication list.  Continue current medication and follow up with Dr EvansAmalia Haileysteoporosis:  Goal: prevent further decrease in BMD, prevent falls and prevent fractures Current therapy:  None Previously took Prolia. Patient was unsure why stopped. Last injection was 2018.  Last DEXA 09/23/2020  Lowest T-Score was -3.0 at neck of right femur 01/10/2018 T-Score at neck of right femur was -2.4 Interventions:  Will check with PCP about possibly restarting Prolia Recommended 1200mg 34mium daily from food and supplementation Recommended vitamin D 1000 until daily Fall prevention discussed.   Medication management Pharmacist Clinical Goal(s): Over the next 90 days, patient will work with PharmD and providers to maintain optimal medication adherence Patient would like to take fewer pills/ medications [facility] Interventions Comprehensive medication review performed. Reviewed refill history and assessed adherence  Continue current medication management strategy Recommended patient hold omeprazole to see if needs to continue or if could control acid reflux with just famotidine at bedtime.  Stop melatonin - can restart if any change in sleep Answered patient's questions about newly approved Leqembi. Dosing is 100mg I76mfusion over 1 hours every 2 weeks. (Patient thought was pill or subcutaneously injection pharmacist could administer). Cost is also a barrier (About $26,000 per year. Now would likely be covered by Medicare). 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 Provided patient with phone number for ESAI patient assistance program to see about cost. She would also need to find a neurologist that prescribes Leqemib.   Patient Goals/Self-Care Activities Over the next 90 days, patient will:  take medications as prescribed,  focus on medication adherence by using weekly medication boxes / reminders,  check blood pressure 2 to 3 times per week, document, and provide at future appointments, and  Complete application for Elmiron for 2023.  Continue lower dose of famotidine 20mg at43mtime.  Hold omeprazole 20mg dai7mMonitor increase in acid reflux / heartburn. If you experience increase in acid reflux or heartburn, then restart omeprazole  Hold melatonin . Monitor for changes in sleep duration or quality. Can restart melatonin if you see any changes.  Trial tapering hydroxyzine 110mg - ta40m.5 tablet once a day for 1 week, if not changes in bladder symptoms you can try stopping.  If you experience any changes in bladder symptoms - pain or discomfort, then restart hydroxyzine.   Follow Up Plan: Telephone  follow up appointment with care management team member scheduled for:  1 to 2 months  Medication Assistance:  Resent patient application for Elmiron patient assistance May 2023 She was denied in 2022 but finances have changed so will retry for 2023. Patient will need to include 2022 tax return (she has not completed her taxes for 2022 yet but will complete application once she does)   Patient[facility] pharmacy [facility]  [id number] [place] [address] [address]Phone: [phone]: [phone] [id number]: [phone]: [phone]  Uses pill box? Yes - daughter is a nMarine scientistand she helps with patient's weekly medication boxes.  Pt endorses 98% compliance  Follow Up:  Patient agrees to Care Plan and Follow-up.  Plan: Telephone follow up appointment with care management team member scheduled for:   1 to 2 months   TCherre Robins PharmD Clinical Pharmacist LAkronMNew LothropHCarolin[facility]

## 2021-10-12 ENCOUNTER — Ambulatory Visit (INDEPENDENT_AMBULATORY_CARE_PROVIDER_SITE_OTHER): Payer: Medicare Other | Admitting: Psychology

## 2021-10-12 DIAGNOSIS — F4323 Adjustment disorder with mixed anxiety and depressed mood: Secondary | ICD-10-CM | POA: Diagnosis not present

## 2021-10-12 NOTE — Progress Notes (Signed)
[place] Counselor/Therapist Progress Note  Patient ID: Shannon [name], MRN: [id number],    Date: 10/12/2021  Time Spent: 3:00pm-3:50pm     50 minutes   Treatment Type: Individual Therapy  Reported Symptoms: stress  Mental Status Exam: Appearance:  Casual     Behavior: Appropriate  Motor: Normal  Speech/Language:  Normal Rate  Affect: Appropriate  Mood: normal  Thought process: normal  Thought content:   WNL  Sensory/Perceptual disturbances:   WNL  Orientation: oriented to person, place, time/date, and situation  Attention: Good  Concentration: Good  Memory: WNL  Fund of knowledge:  Good  Insight:   Good  Judgment:  Good  Impulse Control: Good   Risk Assessment: Danger to Self:  No Self-injurious Behavior: No Danger to Others: No Duty to Warn:no Physical Aggression / Violence:No  Access to Firearms a concern: No  Gang Involvement:No   Subjective:  Pt present for face-to-face individual therapy via video Webex.  Pt consents to telehealth video session due to COVID 19 pandemic. Location of pt: home Location of therapist: home office.   Pt states she is worried about her husband who recently fell.   Pt is worried about his health and his memory.  Pt plans to get on the waiting list at [facility] so they will have their care needs taken care of. Pt is feeling overwhelmed with her big house and managing all of their stuff.  Addressed how pt can elicit help to downsize her stuff.   Pt is going to ask family and church members for help.  Addressed how difficult it is to let go of her stuff and downsize.   Pt is still upset about how some of the church members do not talk to her since she told them about her cognitive issues.  Pt feels very hurt.  Helped pt process her feelings.    Worked on self care. Provided supportive therapy.    Interventions: Cognitive Behavioral Therapy and Insight-Oriented  Diagnosis:  F43.23  Plan: Plan of Care:  Recommend  ongoing therapy.   Pt participated in setting treatment goals.   Plan to meet every two weeks.  Treatment Plan (Treatment Plan Target Date: 04/22/2022) Client Abilities/Strengths  Pt is engaging and motivated for therapy.  Client Treatment Preferences  Individual therapy.  Client Statement of Needs  Improve copings skills and have a place to talk about her feelings.   Symptoms  Depressed or irritable mood. Excessive and/or unrealistic worry that is difficult to control occurring more days than not for at least 6 months about a number of events or activities. Hypervigilance (e.g., feeling constantly on edge, experiencing concentration difficulties, having trouble falling or staying asleep, exhibiting a general state of irritability). Low self-esteem. Problems Addressed  Unipolar Depression, Anxiety Goals 1. Alleviate depressive symptoms and return to previous level of effective functioning. 2. Appropriately grieve the loss in order to normalize mood and to return to previously adaptive level of functioning. Objective Learn and implement behavioral strategies to overcome depression. Target Date: 2022-04-22 Frequency: Biweekly  Progress: 10 Modality: individual  Related Interventions Assist the client in developing skills that increase the likelihood of deriving pleasure from behavioral activation (e.g., assertiveness skills, developing an exercise plan, less internal/more external focus, increased social involvement); reinforce success. Engage the client in "behavioral activation," increasing his/her activity level and contact with sources of reward, while identifying processes that inhibit activation. use behavioral techniques such as instruction, rehearsal, role-playing, role reversal, as needed, to facilitate activity in  the client's daily life; reinforce success. 3. Develop healthy interpersonal relationships that lead to the alleviation and help prevent the relapse of depression. 4. Develop  healthy thinking patterns and beliefs about self, others, and the world that lead to the alleviation and help prevent the relapse of depression. 5. Enhance ability to effectively cope with the full variety of life's worries and anxieties. 6. Learn and implement coping skills that result in a reduction of anxiety and worry, and improved daily functioning. Objective Learn and implement problem-solving strategies for realistically addressing worries. Target Date: 2022-04-22 Frequency: Biweekly  Progress: 10 Modality: individual  Related Interventions Assign the client a homework exercise in which he/she problem-solves a current problem (see Mastery of Your Anxiety and Worry: Workbook by Adora Fridge and Eliot Ford or Generalized Anxiety Disorder by Eather Colas, and Eliot Ford); review, reinforce success, and provide corrective feedback toward improvement. Teach the client problem-solving strategies involving specifically defining a problem, generating options for addressing it, evaluating the pros and cons of each option, selecting and implementing an optional action, and reevaluating and refining the action. Objective Learn and implement calming skills to reduce overall anxiety and manage anxiety symptoms. Target Date: 2022-04-22 Frequency: Biweekly  Progress: 10 Modality: individual  Related Interventions Assign the client to read about progressive muscle relaxation and other calming strategies in relevant books or treatment manuals (e.g., Progressive Relaxation Training by Leroy Kennedy; Mastery of Your Anxiety and Worry: Workbook by Beckie Busing). Assign the client homework each session in which he/she practices relaxation exercises daily, gradually applying them progressively from non-anxiety-provoking to anxiety-provoking situations; review and reinforce success while providing corrective feedback toward improvement. Teach the client calming/relaxation skills (e.g., applied relaxation,  progressive muscle relaxation, cue controlled relaxation; mindful breathing; biofeedback) and how to discriminate better between relaxation and tension; teach the client how to apply these skills to his/her daily life. 7. Recognize, accept, and cope with feelings of depression. 8. Reduce overall frequency, intensity, and duration of the anxiety so that daily functioning is not impaired. 9. Resolve the core conflict that is the source of anxiety. 10. Stabilize anxiety level while increasing ability to function on a daily basis. Diagnosis F43.23   Clint Bolder, LCSW

## 2021-10-12 NOTE — Patient Instructions (Signed)
Shannon [name] It was a pleasure speaking with you today.  Below is a summary of your health goals and summary of our recent visit. You can also view your updated Chronic Care Management Care plan through your MyChart account.   Patient Goals/Self-Care Activities Over the next 90 days, patient will:  take medications as prescribed,  focus on medication adherence by using weekly medication boxes / reminders,  check blood pressure 2 to 3 times per week, document, and provide at future appointments, and  Complete application for Elmiron for 2023.  Continue lower dose of famotidine '20mg'$  at bedtime.  Hold omeprazole '20mg'$  daily. Monitor increase in acid reflux / heartburn. If you experience increase in acid reflux or heartburn, then restart omeprazole Hold melatonin . Monitor for changes in sleep duration or quality. Can restart melatonin if you see any changes.  Trial tapering hydroxyzine '25mg'$  - take 0.5 tablet once a day for 1 week, if not changes in bladder symptoms you can try stopping.  If you experience any changes in bladder symptoms - pain or discomfort, then restart hydroxyzine.   As always if you have any questions or concerns especially regarding medications, please feel free to contact me either at the phone number below or with a MyChart message.   Keep up the good work!  Shannon [name], PharmD Clinical Pharmacist [facility] [phone] (direct line)  [phone] (main office number)   Chronic Care Management Care Plan / Goals of Therapy:  Hypertension / Tachycardia Uncontrolled - home blood pressure improved since telmisartan added back; blood pressure goal <140/90 BP Readings from Last 3 Encounters:  10/04/21 (!) 152/69  09/26/21 128/72  07/14/21 133/78   Current treatment: Carvedilol '25mg'$  twice a day (also helps with heart rate) Diltiazem '120mg'$  daily (also helps with heart rate)  Telmisartan '40mg'$  daily  Interventions:  Discussed blood  pressure goal and importance of blood pressure control in heart and kidney health.  Discussed when was best time to take medications for blood pressure  Recommend checking blood pressure 2 to 3 times per week. Record for future visits.  Hyperlipidemia: Uncontrolled but close to goal LDL goal <70 and triglycerides < 150 Lab Results  Component Value Date   CHOL 167 06/23/2021   HDL 49.90 06/23/2021   LDLCALC 80 06/05/2018   LDLDIRECT 89.0 06/23/2021   TRIG 236.0 (H) 06/23/2021   CHOLHDL 3 06/23/2021   Current treatment:  Rosuvastatin '20mg'$  daily at bedtime Current dietary patterns: not following any particular diet.  Interventions:  Discussed limiting intake of saturated and trans fat - follow heart health diet.  Continue rosuvastatin. Start regular exercise - walking or use son's home gym. Goal is to start with 10 to 30 minutes per day and work up to 150 minutes per week.   Interstitial Cystitis: Managed by [facility] - Shannon [name] and Shannon [name]. Shannon [name]  Currently stable, but notes that cost of Elmiron has recently increased due to being in Medicare Coverage gap Current therapy:  Elmiron '100mg'$  twice a day Hydroxyzine '25mg'$  - take 12.5 to '25mg'$  up to twice a day if needed.  Interventions:  Patient will completed tax return for 2022 and then complete patient assistance program application for Elmiron. Continue current medication and follow up with Shannon [name].  Trial tapering hydroxyzine '25mg'$  - take 0.5 tablet once a day for 1 week, if not changes in bladder symptoms you can try stopping.  If you experience any changes in bladder  symptoms - pain or discomfort, then restart hydroxyzine.    Osteoporosis:  Goal: prevent further decrease in BMD, prevent falls and prevent fractures Current therapy:  None Interventions:  Will check with PCP about possibly restarting Prolia Recommended '1200mg'$  calcium daily from food and supplementation Recommended vitamin D 1000 until  daily Fall prevention discussed  Medication management Pharmacist Clinical Goal(s): Over the next 90 days, patient will work with PharmD and providers to maintain optimal medication adherence [facility] Interventions Comprehensive medication review performed. Reviewed refill history and assessed adherence   Continue current medication management strategy   Follow Up Plan: Telephone follow up appointment with care management team member scheduled for:  1 to 2 months  The patient verbalized understanding of instructions, educational materials, and care plan provided today and agreed to receive a mailed copy of patient instructions, educational materials, and care plan.

## 2021-10-16 ENCOUNTER — Other Ambulatory Visit: Payer: Self-pay | Admitting: Family Medicine

## 2021-10-16 DIAGNOSIS — K2100 Gastro-esophageal reflux disease with esophagitis, without bleeding: Secondary | ICD-10-CM

## 2021-10-17 ENCOUNTER — Other Ambulatory Visit: Payer: Self-pay | Admitting: Adult Health

## 2021-10-17 DIAGNOSIS — Z1231 Encounter for screening mammogram for malignant neoplasm of breast: Secondary | ICD-10-CM

## 2021-10-29 DIAGNOSIS — M810 Age-related osteoporosis without current pathological fracture: Secondary | ICD-10-CM | POA: Diagnosis not present

## 2021-10-29 DIAGNOSIS — E782 Mixed hyperlipidemia: Secondary | ICD-10-CM | POA: Diagnosis not present

## 2021-10-29 DIAGNOSIS — R000 Tachycardia, unspecified: Secondary | ICD-10-CM

## 2021-10-29 DIAGNOSIS — F0670 Mild neurocognitive disorder due to known physiological condition without behavioral disturbance: Secondary | ICD-10-CM

## 2021-10-29 DIAGNOSIS — G309 Alzheimer's disease, unspecified: Secondary | ICD-10-CM

## 2021-10-29 DIAGNOSIS — I10 Essential (primary) hypertension: Secondary | ICD-10-CM | POA: Diagnosis not present

## 2021-11-02 ENCOUNTER — Other Ambulatory Visit: Payer: Self-pay | Admitting: Cardiology

## 2021-11-09 ENCOUNTER — Telehealth: Payer: Medicare Other

## 2021-11-09 ENCOUNTER — Ambulatory Visit: Payer: Medicare Other | Admitting: Psychology

## 2021-11-09 ENCOUNTER — Telehealth: Payer: Self-pay | Admitting: Pharmacist

## 2021-11-09 NOTE — Telephone Encounter (Signed)
  Care Management   Follow Up Note   11/09/2021 Name: NYLIAH NIERENBERG MRN: [id number] DOB: October 23, 1945   Referred by: Mosie Lukes, MD Reason for referral : No chief complaint on file.   An unsuccessful telephone outreach was attempted today. The patient was referred to the case management team for assistance with care management and care coordination.   Follow Up Plan: The care management team will reach out to the patient again over the next 30 days.   Marland Kitchentbes

## 2021-11-10 NOTE — Chronic Care Management (AMB) (Signed)
  Chronic Care Management Note  11/10/2021 Name: [name] MRN: [id number] DOB: [birth year]  Shannon [name] is a 76 y.o. year old female who is a primary care patient of Mosie Lukes, MD and is actively engaged with the care management team. I reached out to Jeral Pinch by phone today to assist with re-scheduling a follow up visit with the Pharmacist  Follow up plan: Unsuccessful telephone outreach attempt made. A HIPAA compliant phone message was left for the patient providing contact information and requesting a return call.   Julian Hy, [place] Direct Dial: [phone]

## 2021-11-11 NOTE — Chronic Care Management (AMB) (Signed)
  Chronic Care Management Note  11/11/2021 Name: Shannon [name] MRN: [id number] DOB: 17-Jun-1945  Shannon [name] is a 76 y.o. year old female who is a primary care patient of Mosie Lukes, MD and is actively engaged with the care management team. I reached out to Jeral Pinch by phone today to assist with re-scheduling a follow up visit with the Pharmacist  Follow up plan: Telephone appointment with care management team member scheduled for: 11/16/2021  Julian Hy, [place] Direct Dial: [phone]

## 2021-11-16 ENCOUNTER — Ambulatory Visit: Payer: Medicare Other | Admitting: Pharmacist

## 2021-11-16 DIAGNOSIS — F0390 Unspecified dementia without behavioral disturbance: Secondary | ICD-10-CM

## 2021-11-16 DIAGNOSIS — I10 Essential (primary) hypertension: Secondary | ICD-10-CM

## 2021-11-16 NOTE — Progress Notes (Addendum)
Pharmacy Note  11/16/2021 Name: Shannon [name] MRN: [id number] DOB: 23-Jun-1945  Subjective: Shannon [name] is a 76 y.o. year old female who is a primary care patient of Mosie Lukes, MD. Clinical Pharmacist Practitioner referral was placed to assist with medication management.    Engaged with patient by telephone for follow up visit today.  Patient had been referred by PCP to see if we could decrease then number of medications she was taking. Last visit was 10/10/2021. At that appointment we discussed some possible medication she would try to taper and maybe eliminate.  Omeprazole '20mg'$  daily. - Has recommended hold omeprazole but continue famotidine '20mg'$  at bedtime.  Patient decided not to lower dose of acid reducing medication. She reports she was concerned that reflux would come back. She is currently taking omeprazole '20mg'$  daily and famotidine '20mg'$  twice a day Melatonin. Patient did stop melatonin. She report sleep is still good. Had 1 or 2 nights of less sleep than 7 hours but over all she feels that she does not need melatonin. Patient reports she sleeps 7 to 8 hours per night most nights.  Hydroxyzine '25mg'$  - Patient doesn't feel like it is effective. Usually helps with histamine which is thought to be part of interstitial cystitis process. She has decided to continue to take 1 tablet daily.    Patient report she needs another application for Elmiron patient assistance program sent to her. She has tax information to include with application.  Patient again asked about Leqembi - new medication to treat Alzheimer's disease / mild dementia.  Answered patient's questions about Leqembi. Recommended she ask neurology office about procedure to see if she would qualify for Leqembi and to discuss with them if this treatment if right for her. She will need to see if her neurology office administers [place]. Patient would also need evaluation to determine that she has amyloid plaque type Alzheimer's /  mild dementia and have MRI prior to starting Leqembi and at intervals during therapy to monitor for swelling and other potential side effects.      Objective: Review of patient status, including review of consultants reports, laboratory and other test data, was performed as part of comprehensive evaluation and provision of chronic care management services.   Lab Results  Component Value Date   CREATININE 0.93 06/23/2021   CREATININE 0.82 10/28/2020   CREATININE 0.90 09/21/2020    Lab Results  Component Value Date   HGBA1C 5.6 06/23/2021       Component Value Date/Time   CHOL 167 06/23/2021 0924   TRIG 236.0 (H) 06/23/2021 0924   HDL 49.90 06/23/2021 0924   CHOLHDL 3 06/23/2021 0924   VLDL 47.2 (H) 06/23/2021 0924   LDLCALC 80 06/05/2018 1215   LDLDIRECT 89.0 06/23/2021 0924     Clinical ASCVD: Yes  The 10-year ASCVD risk score (Arnett DK, et al., 2019) is: 33.9%   Values used to calculate the score:     Age: 52 years     Sex: Female     Is Non-Hispanic African American: No     Diabetic: No     Tobacco smoker: No     Systolic Blood Pressure: 235 mmHg     Is BP treated: Yes     HDL Cholesterol: 49.9 mg/dL     Total Cholesterol: 167 mg/dL    BP Readings from Last 3 Encounters:  10/04/21 (!) 152/69  09/26/21 128/72  07/14/21 133/78     Allergies  Allergen  Reactions   Dilaudid [Hydromorphone Hcl] Itching    Medications Reviewed Today     Reviewed by [name], RPH-CPP (Pharmacist) on 11/16/21 at 1356  Med List Status: <None>   Medication Order Taking? Sig Documenting Provider Last Dose Status Informant  albuterol (VENTOLIN HFA) 108 (90 Base) MCG/ACT inhaler [id number] No INHALE 1-2 PUFFS INTO THE LUNGS EVERY 6 (SIX) HOURS AS NEEDED FOR WHEEZING OR SHORTNESS OF BREATH.  Patient not taking: Reported on 11/16/2021   Mosie Lukes, MD Not Taking Active   Calcium Carbonate (CALCIUM 600 PO) [id number] Yes Take 1 tablet by mouth in the morning and at bedtime.  [provider] Taking Active Self  carvedilol (COREG) 25 MG tablet [id number] Yes TAKE 1 TABLET TWICE DAILY WITH A MEAL Crenshaw, Denice Bors, MD Taking Active   cetirizine (ZYRTEC) 10 MG tablet [id number] Yes Take 10 mg by mouth daily. [provider] Taking Active Self  diltiazem (CARDIZEM CD) 120 MG 24 hr capsule [id number] Yes TAKE 1 CAPSULE BY MOUTH EVERY DAY Crenshaw, Denice Bors, MD Taking Active   docusate sodium (COLACE) 250 MG capsule [id number] Yes Take 250 mg by mouth daily as needed for constipation. [provider] Taking Active Self  donepezil (ARICEPT) 10 MG tablet [id number] Yes TAKE 1 TABLET AT BEDTIME Elease Hashimoto Taking Active   Ergocalciferol 50 MCG (2000 UT) TABS [id number] Yes Take 2,000 Units by mouth daily. [provider] Taking Active   famotidine (PEPCID) 20 MG tablet [id number] Yes TAKE 1 TABLET TWICE DAILY Mosie Lukes, MD Taking Active   ferrous sulfate 325 (65 FE) MG tablet [id number] Yes Take 325 mg by mouth daily with breakfast. [provider] Taking Active Self  folic acid (FOLVITE) 1 MG tablet [id number] Yes Take 1 tablet (1 mg total) by mouth daily. Mosie Lukes, MD Taking Active   gabapentin (NEURONTIN) 300 MG capsule [id number] Yes Take 1 capsule (300 mg total) by mouth 2 (two) times daily. Pieter Partridge, DO Taking Active   hydrOXYzine (ATARAX) 25 MG tablet [id number] Yes TAKE 0.5-1 TABLETS (12.5-25 MG TOTAL) BY MOUTH 2 (TWO) TIMES DAILY AS NEEDED. Mosie Lukes, MD Taking Active   Multiple Vitamin (MULTIVITAMIN WITH MINERALS) TABS tablet [id number] Yes Take 1 tablet by mouth daily. [provider] Taking Active   omeprazole (PRILOSEC) 10 MG capsule [id number] Yes Take 10 mg by mouth daily. [provider] Taking Active   pentosan polysulfate (ELMIRON) 100 MG capsule [id number] Yes Take 100 mg by mouth as directed. two at morning and two at night Dara Lords, Alexzandrew L, PA-C Taking Active Self   rosuvastatin (CRESTOR) 20 MG tablet [id number] Yes TAKE 1 TABLET AT BEDTIME Lelon Perla, MD Taking Active   telmisartan (MICARDIS) 40 MG tablet [id number] Yes Take 1 tablet (40 mg total) by mouth daily. 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   Right knee pain 07/10/2020   Urinary frequency 02/11/2020   Debility 12/23/2017   Left knee  pain 12/23/2017   Vitamin D deficiency 06/14/2017   Vertebral fracture, osteoporotic 04/05/2016   Left-sided back pain 03/06/2016   Chest pain 02/27/2016   Syncope 02/27/2016   Cataracts, bilateral 12/28/2015   Recurrent falls 12/28/2015   Family history- stomach cancer 09/08/2015   Family history of cancer 09/08/2015   [name] lesion 06/21/2015   Obesity 09/28/2014   Interstitial cystitis 09/28/2014   Pain in joint, ankle and foot 09/28/2014   Abnormal nuclear stress test 12/16/2013   Sternal pain 11/06/2013   Postoperative anemia due to acute blood loss 02/13/2013   Congestive dilated cardiomyopathy ([place]) 12/30/2012   Breast cancer of lower-inner quadrant of right female breast 10/21/2012   Constipation 03/03/2012   OA (osteoarthritis) 09/07/2011   Dermatitis    Hyponatremia 08/28/2011   Osteoporosis 03/07/2011   Prediabetes 06/15/2009   Lipoma 02/10/2009   Dyspnea on exertion 02/10/2009   Hyperlipidemia, mixed 08/11/2008   Major depressive disorder with anxious distress 08/11/2008   Essential hypertension 08/11/2008   GERD (gastroesophageal reflux disease)  08/11/2008   Craniopharyngioma 2000     Medication Assistance:   Mailed application for Elmiron medication assistance program. Reminded patient to include copy of her insurance card, report from pharmacy regarding 2023 out of pocket expense and 2022 income tax information.    Assessment / Plan: Reviewed medications with patient. She has decided to continue omeprazole '20mg'$  daily and famotidine '20mg'$  twice a day which I assured her was OK.  Med list has telmisartan '40mg'$  daily (last filled '80mg'$  and has been taking 0.5 tablet daily). Left message for patient's daughter Buddy Duty since she helps with medications that if they want to switch to telmisartan '40mg'$  in the future to let me know. I just don't have to change until she is aware to prevent confusion.  Encouraged patient to discuss Leqembi with her neurologist. She will need evaluation and testing by them to determine if this medication would be appropriate for her.   Follow Up:  Telephone follow up appointment with care management team member scheduled for:  3 months.   [name], PharmD Clinical Pharmacist [place] [phone]

## 2021-11-17 ENCOUNTER — Ambulatory Visit
Admission: RE | Admit: 2021-11-17 | Discharge: 2021-11-17 | Disposition: A | Discharge status: Home / Self Care | Payer: Medicare Other | Source: Ambulatory Visit | Attending: Adult Health | Admitting: Adult Health

## 2021-11-17 DIAGNOSIS — Z1231 Encounter for screening mammogram for malignant neoplasm of breast: Secondary | ICD-10-CM

## 2021-11-21 ENCOUNTER — Inpatient Hospital Stay (HOSPITAL_COMMUNITY): Payer: Medicare Other

## 2021-11-21 ENCOUNTER — Encounter (HOSPITAL_COMMUNITY)
Admission: EM | Disposition: A | Discharge status: Skilled Nursing Facility | Payer: Self-pay | Source: Skilled Nursing Facility | Attending: Internal Medicine

## 2021-11-21 ENCOUNTER — Inpatient Hospital Stay (HOSPITAL_COMMUNITY)
Admission: EM | Admit: 2021-11-21 | Discharge: 2021-11-26 | DRG: 522 | Disposition: A | Discharge status: Skilled Nursing Facility | Payer: Medicare Other | Source: Skilled Nursing Facility | Attending: Internal Medicine | Admitting: Internal Medicine

## 2021-11-21 ENCOUNTER — Ambulatory Visit: Payer: Self-pay | Admitting: Student

## 2021-11-21 ENCOUNTER — Other Ambulatory Visit: Payer: Self-pay

## 2021-11-21 ENCOUNTER — Emergency Department (HOSPITAL_COMMUNITY): Payer: Medicare Other

## 2021-11-21 ENCOUNTER — Encounter (HOSPITAL_COMMUNITY): Payer: Self-pay | Admitting: *Deleted

## 2021-11-21 ENCOUNTER — Inpatient Hospital Stay (HOSPITAL_COMMUNITY): Payer: Medicare Other | Admitting: Anesthesiology

## 2021-11-21 ENCOUNTER — Inpatient Hospital Stay: Admit: 2021-11-21 | Payer: Medicare Other | Admitting: Orthopedic Surgery

## 2021-11-21 DIAGNOSIS — Z01818 Encounter for other preprocedural examination: Secondary | ICD-10-CM | POA: Diagnosis not present

## 2021-11-21 DIAGNOSIS — R7303 Prediabetes: Secondary | ICD-10-CM | POA: Diagnosis present

## 2021-11-21 DIAGNOSIS — M810 Age-related osteoporosis without current pathological fracture: Secondary | ICD-10-CM | POA: Diagnosis present

## 2021-11-21 DIAGNOSIS — Z87440 Personal history of urinary (tract) infections: Secondary | ICD-10-CM | POA: Diagnosis not present

## 2021-11-21 DIAGNOSIS — F0390 Unspecified dementia without behavioral disturbance: Secondary | ICD-10-CM | POA: Diagnosis present

## 2021-11-21 DIAGNOSIS — S72012A Unspecified intracapsular fracture of left femur, initial encounter for closed fracture: Secondary | ICD-10-CM | POA: Diagnosis not present

## 2021-11-21 DIAGNOSIS — W1830XA Fall on same level, unspecified, initial encounter: Secondary | ICD-10-CM | POA: Diagnosis present

## 2021-11-21 DIAGNOSIS — S72002D Fracture of unspecified part of neck of left femur, subsequent encounter for closed fracture with routine healing: Secondary | ICD-10-CM | POA: Diagnosis not present

## 2021-11-21 DIAGNOSIS — Z79899 Other long term (current) drug therapy: Secondary | ICD-10-CM

## 2021-11-21 DIAGNOSIS — M7989 Other specified soft tissue disorders: Secondary | ICD-10-CM | POA: Diagnosis not present

## 2021-11-21 DIAGNOSIS — I420 Dilated cardiomyopathy: Secondary | ICD-10-CM | POA: Diagnosis present

## 2021-11-21 DIAGNOSIS — R001 Bradycardia, unspecified: Secondary | ICD-10-CM | POA: Diagnosis not present

## 2021-11-21 DIAGNOSIS — M6281 Muscle weakness (generalized): Secondary | ICD-10-CM | POA: Diagnosis not present

## 2021-11-21 DIAGNOSIS — N3010 Interstitial cystitis (chronic) without hematuria: Secondary | ICD-10-CM | POA: Diagnosis present

## 2021-11-21 DIAGNOSIS — I5032 Chronic diastolic (congestive) heart failure: Secondary | ICD-10-CM | POA: Diagnosis not present

## 2021-11-21 DIAGNOSIS — D62 Acute posthemorrhagic anemia: Secondary | ICD-10-CM | POA: Diagnosis not present

## 2021-11-21 DIAGNOSIS — G629 Polyneuropathy, unspecified: Secondary | ICD-10-CM | POA: Diagnosis not present

## 2021-11-21 DIAGNOSIS — M25561 Pain in right knee: Secondary | ICD-10-CM | POA: Diagnosis not present

## 2021-11-21 DIAGNOSIS — R269 Unspecified abnormalities of gait and mobility: Secondary | ICD-10-CM | POA: Diagnosis not present

## 2021-11-21 DIAGNOSIS — G4730 Sleep apnea, unspecified: Secondary | ICD-10-CM | POA: Diagnosis not present

## 2021-11-21 DIAGNOSIS — Z853 Personal history of malignant neoplasm of breast: Secondary | ICD-10-CM | POA: Diagnosis not present

## 2021-11-21 DIAGNOSIS — Z9989 Dependence on other enabling machines and devices: Secondary | ICD-10-CM

## 2021-11-21 DIAGNOSIS — Z9181 History of falling: Secondary | ICD-10-CM | POA: Diagnosis not present

## 2021-11-21 DIAGNOSIS — I10 Essential (primary) hypertension: Secondary | ICD-10-CM | POA: Diagnosis not present

## 2021-11-21 DIAGNOSIS — R262 Difficulty in walking, not elsewhere classified: Secondary | ICD-10-CM | POA: Diagnosis not present

## 2021-11-21 DIAGNOSIS — R41841 Cognitive communication deficit: Secondary | ICD-10-CM | POA: Diagnosis not present

## 2021-11-21 DIAGNOSIS — F0670 Mild neurocognitive disorder due to known physiological condition without behavioral disturbance: Secondary | ICD-10-CM | POA: Diagnosis present

## 2021-11-21 DIAGNOSIS — Z96642 Presence of left artificial hip joint: Secondary | ICD-10-CM | POA: Diagnosis not present

## 2021-11-21 DIAGNOSIS — Z885 Allergy status to narcotic agent status: Secondary | ICD-10-CM

## 2021-11-21 DIAGNOSIS — R531 Weakness: Secondary | ICD-10-CM | POA: Diagnosis not present

## 2021-11-21 DIAGNOSIS — K219 Gastro-esophageal reflux disease without esophagitis: Secondary | ICD-10-CM | POA: Diagnosis not present

## 2021-11-21 DIAGNOSIS — I110 Hypertensive heart disease with heart failure: Secondary | ICD-10-CM | POA: Diagnosis present

## 2021-11-21 DIAGNOSIS — S72002A Fracture of unspecified part of neck of left femur, initial encounter for closed fracture: Secondary | ICD-10-CM | POA: Diagnosis not present

## 2021-11-21 DIAGNOSIS — W19XXXA Unspecified fall, initial encounter: Secondary | ICD-10-CM | POA: Diagnosis not present

## 2021-11-21 DIAGNOSIS — G309 Alzheimer's disease, unspecified: Secondary | ICD-10-CM | POA: Diagnosis not present

## 2021-11-21 DIAGNOSIS — Z4789 Encounter for other orthopedic aftercare: Secondary | ICD-10-CM | POA: Diagnosis not present

## 2021-11-21 DIAGNOSIS — R002 Palpitations: Secondary | ICD-10-CM | POA: Diagnosis present

## 2021-11-21 DIAGNOSIS — Z85820 Personal history of malignant melanoma of skin: Secondary | ICD-10-CM

## 2021-11-21 DIAGNOSIS — Z9049 Acquired absence of other specified parts of digestive tract: Secondary | ICD-10-CM

## 2021-11-21 DIAGNOSIS — L309 Dermatitis, unspecified: Secondary | ICD-10-CM | POA: Diagnosis not present

## 2021-11-21 DIAGNOSIS — Y92009 Unspecified place in unspecified non-institutional (private) residence as the place of occurrence of the external cause: Secondary | ICD-10-CM | POA: Diagnosis not present

## 2021-11-21 DIAGNOSIS — F0280 Dementia in other diseases classified elsewhere without behavioral disturbance: Secondary | ICD-10-CM | POA: Diagnosis present

## 2021-11-21 DIAGNOSIS — F329 Major depressive disorder, single episode, unspecified: Secondary | ICD-10-CM | POA: Diagnosis not present

## 2021-11-21 DIAGNOSIS — Z6829 Body mass index (BMI) 29.0-29.9, adult: Secondary | ICD-10-CM

## 2021-11-21 DIAGNOSIS — G4733 Obstructive sleep apnea (adult) (pediatric): Secondary | ICD-10-CM

## 2021-11-21 DIAGNOSIS — R350 Frequency of micturition: Secondary | ICD-10-CM | POA: Diagnosis not present

## 2021-11-21 DIAGNOSIS — K5900 Constipation, unspecified: Secondary | ICD-10-CM | POA: Diagnosis not present

## 2021-11-21 DIAGNOSIS — M25512 Pain in left shoulder: Secondary | ICD-10-CM | POA: Diagnosis not present

## 2021-11-21 DIAGNOSIS — Z923 Personal history of irradiation: Secondary | ICD-10-CM

## 2021-11-21 DIAGNOSIS — Z96653 Presence of artificial knee joint, bilateral: Secondary | ICD-10-CM | POA: Diagnosis present

## 2021-11-21 DIAGNOSIS — Z7401 Bed confinement status: Secondary | ICD-10-CM | POA: Diagnosis not present

## 2021-11-21 DIAGNOSIS — E782 Mixed hyperlipidemia: Secondary | ICD-10-CM | POA: Diagnosis present

## 2021-11-21 DIAGNOSIS — E669 Obesity, unspecified: Secondary | ICD-10-CM | POA: Diagnosis present

## 2021-11-21 DIAGNOSIS — F418 Other specified anxiety disorders: Secondary | ICD-10-CM | POA: Diagnosis present

## 2021-11-21 DIAGNOSIS — K449 Diaphragmatic hernia without obstruction or gangrene: Secondary | ICD-10-CM | POA: Diagnosis not present

## 2021-11-21 DIAGNOSIS — F4323 Adjustment disorder with mixed anxiety and depressed mood: Secondary | ICD-10-CM | POA: Diagnosis not present

## 2021-11-21 DIAGNOSIS — Z471 Aftercare following joint replacement surgery: Secondary | ICD-10-CM | POA: Diagnosis not present

## 2021-11-21 DIAGNOSIS — R404 Transient alteration of awareness: Secondary | ICD-10-CM | POA: Diagnosis not present

## 2021-11-21 DIAGNOSIS — M79605 Pain in left leg: Secondary | ICD-10-CM | POA: Diagnosis not present

## 2021-11-21 DIAGNOSIS — Z9221 Personal history of antineoplastic chemotherapy: Secondary | ICD-10-CM

## 2021-11-21 DIAGNOSIS — D649 Anemia, unspecified: Secondary | ICD-10-CM | POA: Diagnosis not present

## 2021-11-21 DIAGNOSIS — E559 Vitamin D deficiency, unspecified: Secondary | ICD-10-CM | POA: Diagnosis not present

## 2021-11-21 DIAGNOSIS — I5189 Other ill-defined heart diseases: Secondary | ICD-10-CM

## 2021-11-21 DIAGNOSIS — Z803 Family history of malignant neoplasm of breast: Secondary | ICD-10-CM

## 2021-11-21 DIAGNOSIS — J309 Allergic rhinitis, unspecified: Secondary | ICD-10-CM | POA: Diagnosis not present

## 2021-11-21 DIAGNOSIS — Z8249 Family history of ischemic heart disease and other diseases of the circulatory system: Secondary | ICD-10-CM

## 2021-11-21 HISTORY — PX: TOTAL HIP ARTHROPLASTY: SHX124

## 2021-11-21 HISTORY — DX: Other ill-defined heart diseases: I51.89

## 2021-11-21 HISTORY — DX: Fracture of unspecified part of neck of left femur, initial encounter for closed fracture: S72.002A

## 2021-11-21 LAB — CBC WITH DIFFERENTIAL/PLATELET
Abs Immature Granulocytes: 0.06 10*3/uL (ref 0.00–0.07)
Basophils Absolute: 0.1 10*3/uL (ref 0.0–0.1)
Basophils Relative: 1 %
Eosinophils Absolute: 0.3 10*3/uL (ref 0.0–0.5)
Eosinophils Relative: 3 %
HCT: 36.7 % (ref 36.0–46.0)
Hemoglobin: 11.7 g/dL — ABNORMAL LOW (ref 12.0–15.0)
Immature Granulocytes: 1 %
Lymphocytes Relative: 10 %
Lymphs Abs: 1.1 10*3/uL (ref 0.7–4.0)
MCH: 29.5 pg (ref 26.0–34.0)
MCHC: 31.9 g/dL (ref 30.0–36.0)
MCV: 92.7 fL (ref 80.0–100.0)
Monocytes Absolute: 0.6 10*3/uL (ref 0.1–1.0)
Monocytes Relative: 6 %
Neutro Abs: 8.8 10*3/uL — ABNORMAL HIGH (ref 1.7–7.7)
Neutrophils Relative %: 79 %
Platelets: 194 10*3/uL (ref 150–400)
RBC: 3.96 MIL/uL (ref 3.87–5.11)
RDW: 13.5 % (ref 11.5–15.5)
WBC: 10.9 10*3/uL — ABNORMAL HIGH (ref 4.0–10.5)
nRBC: 0 % (ref 0.0–0.2)

## 2021-11-21 LAB — BASIC METABOLIC PANEL
Anion gap: 6 (ref 5–15)
BUN: 17 mg/dL (ref 8–23)
CO2: 28 mmol/L (ref 22–32)
Calcium: 8.4 mg/dL — ABNORMAL LOW (ref 8.9–10.3)
Chloride: 106 mmol/L (ref 98–111)
Creatinine, Ser: 0.79 mg/dL (ref 0.44–1.00)
GFR, Estimated: >60 mL/min (ref >60)
Glucose, Bld: 110 mg/dL — ABNORMAL HIGH (ref 70–99)
Potassium: 3.8 mmol/L (ref 3.5–5.1)
Sodium: 140 mmol/L (ref 135–145)

## 2021-11-21 LAB — PROTIME-INR
INR: 1 (ref 0.8–1.2)
Prothrombin Time: 13.3 seconds (ref 11.4–15.2)

## 2021-11-21 SURGERY — ARTHROPLASTY, HIP, TOTAL, ANTERIOR APPROACH
Anesthesia: General | Site: Hip | Laterality: Left

## 2021-11-21 MED ORDER — POLYETHYLENE GLYCOL 3350 17 G PO PACK: 17.0000 g | PACK | Freq: Every day | ORAL | Status: DC | PRN

## 2021-11-21 MED ORDER — METOCLOPRAMIDE HCL 5 MG/ML IJ SOLN: 5.0000 mg | Freq: Three times a day (TID) | INTRAMUSCULAR | Status: DC | PRN

## 2021-11-21 MED ORDER — LIDOCAINE 2% (20 MG/ML) 5 ML SYRINGE
INTRAMUSCULAR | Status: DC | PRN
  Administered 2021-11-21: 60 mg via INTRAVENOUS

## 2021-11-21 MED ORDER — OXYCODONE HCL 5 MG PO TABS: 5.0000 mg | ORAL_TABLET | Freq: Once | ORAL | Status: DC | PRN

## 2021-11-21 MED ORDER — ALUM & MAG HYDROXIDE-SIMETH 200-200-20 MG/5ML PO SUSP: 30.0000 mL | ORAL | Status: DC | PRN

## 2021-11-21 MED ORDER — ONDANSETRON HCL 4 MG/2ML IJ SOLN
INTRAMUSCULAR | Status: DC | PRN
  Administered 2021-11-21: 4 mg via INTRAVENOUS

## 2021-11-21 MED ORDER — FAMOTIDINE 20 MG PO TABS
20.0000 mg | ORAL_TABLET | Freq: Every day | ORAL | Status: DC
  Administered 2021-11-22 – 2021-11-26 (×5): 20 mg via ORAL
  Filled 2021-11-21 (×5): qty 1

## 2021-11-21 MED ORDER — ONDANSETRON HCL 4 MG/2ML IJ SOLN: 4.0000 mg | Freq: Four times a day (QID) | INTRAMUSCULAR | Status: DC | PRN

## 2021-11-21 MED ORDER — ONDANSETRON HCL 4 MG/2ML IJ SOLN
4.0000 mg | Freq: Once | INTRAMUSCULAR | Status: DC
  Filled 2021-11-21: qty 2

## 2021-11-21 MED ORDER — KETOROLAC TROMETHAMINE 30 MG/ML IJ SOLN
INTRAMUSCULAR | Status: DC | PRN
  Administered 2021-11-21: 30 mg

## 2021-11-21 MED ORDER — PANTOPRAZOLE SODIUM 40 MG PO TBEC
40.0000 mg | DELAYED_RELEASE_TABLET | Freq: Every day | ORAL | Status: DC
  Administered 2021-11-22 – 2021-11-26 (×5): 40 mg via ORAL
  Filled 2021-11-21 (×5): qty 1

## 2021-11-21 MED ORDER — ALBUMIN HUMAN 5 % IV SOLN: INTRAVENOUS | Status: DC | PRN

## 2021-11-21 MED ORDER — FENTANYL CITRATE PF 50 MCG/ML IJ SOSY: 25.0000 ug | PREFILLED_SYRINGE | INTRAMUSCULAR | Status: DC | PRN

## 2021-11-21 MED ORDER — PHENOL 1.4 % MT LIQD: 1.0000 | OROMUCOSAL | Status: DC | PRN

## 2021-11-21 MED ORDER — DOCUSATE SODIUM 100 MG PO CAPS
100.0000 mg | ORAL_CAPSULE | Freq: Two times a day (BID) | ORAL | Status: DC
  Administered 2021-11-21 – 2021-11-26 (×8): 100 mg via ORAL
  Filled 2021-11-21 (×9): qty 1

## 2021-11-21 MED ORDER — CEFAZOLIN SODIUM-DEXTROSE 2-4 GM/100ML-% IV SOLN
2.0000 g | INTRAVENOUS | Status: AC
  Administered 2021-11-21: 2 g via INTRAVENOUS

## 2021-11-21 MED ORDER — PROPOFOL 1000 MG/100ML IV EMUL
INTRAVENOUS | Status: AC
  Filled 2021-11-21: qty 100

## 2021-11-21 MED ORDER — PENTOSAN POLYSULFATE SODIUM 100 MG PO CAPS
200.0000 mg | ORAL_CAPSULE | Freq: Two times a day (BID) | ORAL | Status: DC
  Administered 2021-11-22 – 2021-11-26 (×7): 200 mg via ORAL
  Filled 2021-11-21 (×10): qty 2

## 2021-11-21 MED ORDER — LACTATED RINGERS IV SOLN: INTRAVENOUS | Status: DC

## 2021-11-21 MED ORDER — HYDROCODONE-ACETAMINOPHEN 5-325 MG PO TABS
1.0000 | ORAL_TABLET | ORAL | Status: DC | PRN
  Administered 2021-11-21 – 2021-11-26 (×5): 1 via ORAL
  Filled 2021-11-21 (×5): qty 1

## 2021-11-21 MED ORDER — FENTANYL CITRATE PF 50 MCG/ML IJ SOSY
50.0000 ug | PREFILLED_SYRINGE | Freq: Once | INTRAMUSCULAR | Status: AC
  Administered 2021-11-21: 50 ug via INTRAVENOUS
  Filled 2021-11-21: qty 1

## 2021-11-21 MED ORDER — ACETAMINOPHEN 325 MG PO TABS
650.0000 mg | ORAL_TABLET | Freq: Four times a day (QID) | ORAL | Status: DC | PRN
  Administered 2021-11-22 – 2021-11-25 (×5): 650 mg via ORAL
  Filled 2021-11-21 (×6): qty 2

## 2021-11-21 MED ORDER — METHOCARBAMOL 500 MG PO TABS
500.0000 mg | ORAL_TABLET | Freq: Four times a day (QID) | ORAL | Status: DC | PRN
  Administered 2021-11-23: 500 mg via ORAL
  Filled 2021-11-21: qty 1

## 2021-11-21 MED ORDER — ONDANSETRON HCL 4 MG PO TABS: 4.0000 mg | ORAL_TABLET | Freq: Four times a day (QID) | ORAL | Status: DC | PRN

## 2021-11-21 MED ORDER — ACETAMINOPHEN 650 MG RE SUPP: 650.0000 mg | Freq: Four times a day (QID) | RECTAL | Status: DC | PRN

## 2021-11-21 MED ORDER — SODIUM CHLORIDE (PF) 0.9 % IJ SOLN
INTRAMUSCULAR | Status: DC | PRN
  Administered 2021-11-21: 30 mL

## 2021-11-21 MED ORDER — PHENYLEPHRINE HCL-NACL 20-0.9 MG/250ML-% IV SOLN
INTRAVENOUS | Status: DC | PRN
  Administered 2021-11-21: 50 ug/min via INTRAVENOUS

## 2021-11-21 MED ORDER — MENTHOL 3 MG MT LOZG: 1.0000 | LOZENGE | OROMUCOSAL | Status: DC | PRN

## 2021-11-21 MED ORDER — BUPIVACAINE-EPINEPHRINE (PF) 0.25% -1:200000 IJ SOLN
INTRAMUSCULAR | Status: AC
  Filled 2021-11-21: qty 30

## 2021-11-21 MED ORDER — WATER FOR IRRIGATION, STERILE IR SOLN
Status: DC | PRN
  Administered 2021-11-21: 2000 mL

## 2021-11-21 MED ORDER — ACETAMINOPHEN 500 MG PO TABS
1000.0000 mg | ORAL_TABLET | Freq: Once | ORAL | Status: AC
  Administered 2021-11-21: 1000 mg via ORAL

## 2021-11-21 MED ORDER — MORPHINE SULFATE (PF) 2 MG/ML IV SOLN: 0.5000 mg | INTRAVENOUS | Status: DC | PRN

## 2021-11-21 MED ORDER — CEFAZOLIN SODIUM-DEXTROSE 2-4 GM/100ML-% IV SOLN
INTRAVENOUS | Status: AC
  Filled 2021-11-21: qty 100

## 2021-11-21 MED ORDER — PHENYLEPHRINE 80 MCG/ML (10ML) SYRINGE FOR IV PUSH (FOR BLOOD PRESSURE SUPPORT)
PREFILLED_SYRINGE | INTRAVENOUS | Status: DC | PRN
  Administered 2021-11-21 (×3): 160 ug via INTRAVENOUS

## 2021-11-21 MED ORDER — VENLAFAXINE HCL ER 75 MG PO CP24
225.0000 mg | ORAL_CAPSULE | Freq: Every day | ORAL | Status: DC
  Administered 2021-11-22 – 2021-11-26 (×5): 225 mg via ORAL
  Filled 2021-11-21 (×5): qty 1

## 2021-11-21 MED ORDER — CARVEDILOL 25 MG PO TABS
25.0000 mg | ORAL_TABLET | Freq: Two times a day (BID) | ORAL | Status: DC
  Administered 2021-11-22 – 2021-11-26 (×8): 25 mg via ORAL
  Filled 2021-11-21 (×9): qty 1

## 2021-11-21 MED ORDER — LORATADINE 10 MG PO TABS
10.0000 mg | ORAL_TABLET | Freq: Every day | ORAL | Status: DC
  Administered 2021-11-23 – 2021-11-26 (×4): 10 mg via ORAL
  Filled 2021-11-21 (×5): qty 1

## 2021-11-21 MED ORDER — OXYCODONE HCL 5 MG/5ML PO SOLN: 5.0000 mg | Freq: Once | ORAL | Status: DC | PRN

## 2021-11-21 MED ORDER — ROSUVASTATIN CALCIUM 20 MG PO TABS
20.0000 mg | ORAL_TABLET | Freq: Every day | ORAL | Status: DC
  Administered 2021-11-21 – 2021-11-25 (×5): 20 mg via ORAL
  Filled 2021-11-21 (×5): qty 1

## 2021-11-21 MED ORDER — DEXAMETHASONE SODIUM PHOSPHATE 10 MG/ML IJ SOLN
INTRAMUSCULAR | Status: DC | PRN
  Administered 2021-11-21: 8 mg via INTRAVENOUS

## 2021-11-21 MED ORDER — PROPOFOL 500 MG/50ML IV EMUL
INTRAVENOUS | Status: DC | PRN
  Administered 2021-11-21: 25 ug/kg/min via INTRAVENOUS

## 2021-11-21 MED ORDER — GABAPENTIN 300 MG PO CAPS
300.0000 mg | ORAL_CAPSULE | Freq: Two times a day (BID) | ORAL | Status: DC
  Administered 2021-11-21 – 2021-11-26 (×10): 300 mg via ORAL
  Filled 2021-11-21 (×10): qty 1

## 2021-11-21 MED ORDER — CHLORHEXIDINE GLUCONATE 4 % EX LIQD: 60.0000 mL | Freq: Once | CUTANEOUS | Status: DC

## 2021-11-21 MED ORDER — POVIDONE-IODINE 10 % EX SWAB: 2.0000 | Freq: Once | CUTANEOUS | Status: DC

## 2021-11-21 MED ORDER — METHOCARBAMOL 1000 MG/10ML IJ SOLN: 500.0000 mg | Freq: Four times a day (QID) | INTRAVENOUS | Status: DC | PRN

## 2021-11-21 MED ORDER — ISOPROPYL ALCOHOL 70 % SOLN
Status: DC | PRN
  Administered 2021-11-21: 1 via TOPICAL

## 2021-11-21 MED ORDER — ALBUTEROL SULFATE (2.5 MG/3ML) 0.083% IN NEBU: 2.5000 mg | INHALATION_SOLUTION | Freq: Four times a day (QID) | RESPIRATORY_TRACT | Status: DC | PRN

## 2021-11-21 MED ORDER — KETOROLAC TROMETHAMINE 30 MG/ML IJ SOLN
INTRAMUSCULAR | Status: AC
  Filled 2021-11-21: qty 1

## 2021-11-21 MED ORDER — PROPOFOL 10 MG/ML IV BOLUS
INTRAVENOUS | Status: DC | PRN
  Administered 2021-11-21: 120 mg via INTRAVENOUS

## 2021-11-21 MED ORDER — PHENYLEPHRINE HCL (PRESSORS) 10 MG/ML IV SOLN
INTRAVENOUS | Status: AC
  Filled 2021-11-21: qty 1

## 2021-11-21 MED ORDER — HYDROCODONE-ACETAMINOPHEN 7.5-325 MG PO TABS: 1.0000 | ORAL_TABLET | ORAL | Status: DC | PRN

## 2021-11-21 MED ORDER — POVIDONE-IODINE 10 % EX SWAB
2.0000 | Freq: Once | CUTANEOUS | Status: AC
  Administered 2021-11-21: 2 via TOPICAL

## 2021-11-21 MED ORDER — NYSTATIN 100000 UNIT/GM EX POWD
Freq: Two times a day (BID) | CUTANEOUS | Status: DC
  Filled 2021-11-21: qty 15

## 2021-11-21 MED ORDER — NYSTATIN 100000 UNIT/GM EX POWD
Freq: Three times a day (TID) | CUTANEOUS | Status: DC
  Administered 2021-11-21 – 2021-11-22 (×2): 1 via TOPICAL
  Filled 2021-11-21: qty 15

## 2021-11-21 MED ORDER — SODIUM CHLORIDE 0.9 % IV SOLN: INTRAVENOUS | Status: DC

## 2021-11-21 MED ORDER — SODIUM CHLORIDE 0.9 % IR SOLN
Status: DC | PRN
  Administered 2021-11-21: 3000 mL
  Administered 2021-11-21: 1000 mL

## 2021-11-21 MED ORDER — ONDANSETRON HCL 4 MG/2ML IJ SOLN: 4.0000 mg | Freq: Once | INTRAMUSCULAR | Status: DC | PRN

## 2021-11-21 MED ORDER — SUGAMMADEX SODIUM 200 MG/2ML IV SOLN
INTRAVENOUS | Status: DC | PRN
  Administered 2021-11-21: 140 mg via INTRAVENOUS

## 2021-11-21 MED ORDER — BUPIVACAINE-EPINEPHRINE 0.25% -1:200000 IJ SOLN
INTRAMUSCULAR | Status: DC | PRN
  Administered 2021-11-21: 30 mL

## 2021-11-21 MED ORDER — SODIUM CHLORIDE (PF) 0.9 % IJ SOLN
INTRAMUSCULAR | Status: AC
  Filled 2021-11-21: qty 50

## 2021-11-21 MED ORDER — ACETAMINOPHEN 500 MG PO TABS
ORAL_TABLET | ORAL | Status: AC
  Filled 2021-11-21: qty 2

## 2021-11-21 MED ORDER — DIPHENHYDRAMINE HCL 12.5 MG/5ML PO ELIX: 12.5000 mg | ORAL_SOLUTION | ORAL | Status: DC | PRN

## 2021-11-21 MED ORDER — HYDROXYZINE HCL 25 MG PO TABS: 12.5000 mg | ORAL_TABLET | Freq: Two times a day (BID) | ORAL | Status: DC | PRN

## 2021-11-21 MED ORDER — ROCURONIUM BROMIDE 10 MG/ML (PF) SYRINGE
PREFILLED_SYRINGE | INTRAVENOUS | Status: DC | PRN
  Administered 2021-11-21: 50 mg via INTRAVENOUS
  Administered 2021-11-21: 20 mg via INTRAVENOUS

## 2021-11-21 MED ORDER — ONDANSETRON HCL 4 MG/2ML IJ SOLN
4.0000 mg | Freq: Four times a day (QID) | INTRAMUSCULAR | Status: DC | PRN
  Administered 2021-11-21: 4 mg via INTRAVENOUS

## 2021-11-21 MED ORDER — CEFAZOLIN SODIUM-DEXTROSE 2-4 GM/100ML-% IV SOLN
2.0000 g | Freq: Four times a day (QID) | INTRAVENOUS | Status: AC
  Administered 2021-11-21 – 2021-11-22 (×2): 2 g via INTRAVENOUS
  Filled 2021-11-21 (×2): qty 100

## 2021-11-21 MED ORDER — FENTANYL CITRATE (PF) 100 MCG/2ML IJ SOLN
INTRAMUSCULAR | Status: DC | PRN
  Administered 2021-11-21 (×3): 50 ug via INTRAVENOUS

## 2021-11-21 MED ORDER — DONEPEZIL HCL 10 MG PO TABS
10.0000 mg | ORAL_TABLET | Freq: Every day | ORAL | Status: DC
  Administered 2021-11-21 – 2021-11-25 (×5): 10 mg via ORAL
  Filled 2021-11-21 (×5): qty 1

## 2021-11-21 MED ORDER — ACETAMINOPHEN 325 MG PO TABS
325.0000 mg | ORAL_TABLET | Freq: Four times a day (QID) | ORAL | Status: DC | PRN
  Administered 2021-11-23: 650 mg via ORAL
  Administered 2021-11-24: 325 mg via ORAL
  Filled 2021-11-21: qty 1

## 2021-11-21 MED ORDER — VITAMIN D 25 MCG (1000 UNIT) PO TABS
1000.0000 [IU] | ORAL_TABLET | Freq: Every day | ORAL | Status: DC
  Administered 2021-11-22 – 2021-11-26 (×5): 1000 [IU] via ORAL
  Filled 2021-11-21 (×6): qty 1

## 2021-11-21 MED ORDER — FOLIC ACID 1 MG PO TABS
1.0000 mg | ORAL_TABLET | Freq: Every day | ORAL | Status: DC
  Administered 2021-11-22 – 2021-11-26 (×5): 1 mg via ORAL
  Filled 2021-11-21 (×5): qty 1

## 2021-11-21 MED ORDER — TRANEXAMIC ACID-NACL 1000-0.7 MG/100ML-% IV SOLN
1000.0000 mg | INTRAVENOUS | Status: AC
  Administered 2021-11-21: 1000 mg via INTRAVENOUS
  Filled 2021-11-21: qty 100

## 2021-11-21 MED ORDER — METOCLOPRAMIDE HCL 5 MG PO TABS: 5.0000 mg | ORAL_TABLET | Freq: Three times a day (TID) | ORAL | Status: DC | PRN

## 2021-11-21 MED ORDER — KETOROLAC TROMETHAMINE 15 MG/ML IJ SOLN
15.0000 mg | Freq: Once | INTRAMUSCULAR | Status: AC
  Administered 2021-11-21: 15 mg via INTRAVENOUS
  Filled 2021-11-21: qty 1

## 2021-11-21 MED ORDER — DILTIAZEM HCL ER COATED BEADS 120 MG PO CP24
120.0000 mg | ORAL_CAPSULE | Freq: Every day | ORAL | Status: DC
  Administered 2021-11-21 – 2021-11-22 (×2): 120 mg via ORAL
  Filled 2021-11-21 (×2): qty 1

## 2021-11-21 MED ORDER — ASPIRIN 81 MG PO CHEW
81.0000 mg | CHEWABLE_TABLET | Freq: Two times a day (BID) | ORAL | Status: DC
  Administered 2021-11-21 – 2021-11-26 (×10): 81 mg via ORAL
  Filled 2021-11-21 (×10): qty 1

## 2021-11-21 SURGICAL SUPPLY — 56 items
ADH SKN CLS APL DERMABOND .7 (GAUZE/BANDAGES/DRESSINGS) ×2
ADH SKN CLS LQ APL DERMABOND (GAUZE/BANDAGES/DRESSINGS) ×1
APL PRP STRL LF DISP 70% ISPRP (MISCELLANEOUS) ×1
CABLE CERLAGE W/CRIMP 1.8 (Cable) IMPLANT
CHLORAPREP W/TINT 26 (MISCELLANEOUS) ×2 IMPLANT
COVER PERINEAL POST (MISCELLANEOUS) ×2 IMPLANT
COVER SURGICAL LIGHT HANDLE (MISCELLANEOUS) ×2 IMPLANT
DERMABOND ADVANCED .7 DNX12 (GAUZE/BANDAGES/DRESSINGS) ×4 IMPLANT
DERMABOND ADVANCED .7 DNX6 (GAUZE/BANDAGES/DRESSINGS) IMPLANT
DRAPE IMP U-DRAPE 54X76 (DRAPES) ×2 IMPLANT
DRAPE SHEET LG 3/4 BI-LAMINATE (DRAPES) ×6 IMPLANT
DRAPE STERI IOBAN 125X83 (DRAPES) ×2 IMPLANT
DRAPE U-SHAPE 47X51 STRL (DRAPES) ×4 IMPLANT
DRESSING AQUACEL AG SP 3.5X10 (GAUZE/BANDAGES/DRESSINGS) IMPLANT
DRSG AQUACEL AG ADV 3.5X10 (GAUZE/BANDAGES/DRESSINGS) ×2 IMPLANT
DRSG AQUACEL AG SP 3.5X10 (GAUZE/BANDAGES/DRESSINGS) ×1
ELECT REM PT RETURN 15FT ADLT (MISCELLANEOUS) ×2 IMPLANT
GAUZE SPONGE 4X4 12PLY STRL (GAUZE/BANDAGES/DRESSINGS) ×2 IMPLANT
GLOVE BIO SURGEON STRL SZ8.5 (GLOVE) ×4 IMPLANT
GLOVE BIOGEL M 7.0 STRL (GLOVE) ×2 IMPLANT
GLOVE BIOGEL PI IND STRL 7.5 (GLOVE) ×2 IMPLANT
GLOVE BIOGEL PI IND STRL 8.5 (GLOVE) ×2 IMPLANT
GOWN SPEC L3 XXLG W/TWL (GOWN DISPOSABLE) ×2 IMPLANT
GOWN STRL REUS W/ TWL XL LVL3 (GOWN DISPOSABLE) ×2 IMPLANT
GOWN STRL REUS W/TWL XL LVL3 (GOWN DISPOSABLE) ×1
HANDPIECE INTERPULSE COAX TIP (DISPOSABLE) ×1
HEAD FEM -3XOFST 36XMDLR (Head) IMPLANT
HEAD MODULAR 36MM (Head) ×1 IMPLANT
HOOD PEEL AWAY FLYTE STAYCOOL (MISCELLANEOUS) ×6 IMPLANT
KIT TURNOVER KIT A (KITS) IMPLANT
LINER ACETAB ~~LOC~~ D 36 (Liner) IMPLANT
MANIFOLD NEPTUNE II (INSTRUMENTS) ×2 IMPLANT
MARKER SKIN DUAL TIP RULER LAB (MISCELLANEOUS) ×2 IMPLANT
NDL SAFETY ECLIP 18X1.5 (MISCELLANEOUS) ×2 IMPLANT
NDL SPNL 18GX3.5 QUINCKE PK (NEEDLE) ×2 IMPLANT
NEEDLE SPNL 18GX3.5 QUINCKE PK (NEEDLE) ×1 IMPLANT
PACK ANTERIOR HIP CUSTOM (KITS) ×2 IMPLANT
PENCIL SMOKE EVACUATOR (MISCELLANEOUS) IMPLANT
SAW OSC TIP CART 19.5X105X1.3 (SAW) ×2 IMPLANT
SEALER BIPOLAR AQUA 6.0 (INSTRUMENTS) ×2 IMPLANT
SET HNDPC FAN SPRY TIP SCT (DISPOSABLE) ×2 IMPLANT
SHELL ACET G7 3H 50 SZD (Shell) IMPLANT
SOLUTION PRONTOSAN WOUND 350ML (IRRIGATION / IRRIGATOR) ×2 IMPLANT
SPIKE FLUID TRANSFER (MISCELLANEOUS) ×2 IMPLANT
STAPLER VISISTAT (STAPLE) IMPLANT
STEM FEM CMTLS 13 146 (Stem) IMPLANT
SUT MNCRL AB 3-0 PS2 18 (SUTURE) ×2 IMPLANT
SUT MON AB 2-0 CT1 36 (SUTURE) ×2 IMPLANT
SUT STRATAFIX PDO 1 14 VIOLET (SUTURE) ×1
SUT STRATFX PDO 1 14 VIOLET (SUTURE) ×1
SUT VIC AB 2-0 CT1 27 (SUTURE)
SUT VIC AB 2-0 CT1 TAPERPNT 27 (SUTURE) IMPLANT
SUTURE STRATFX PDO 1 14 VIOLET (SUTURE) ×2 IMPLANT
SYR 3ML LL SCALE MARK (SYRINGE) ×2 IMPLANT
TUBE SUCTION HIGH CAP CLEAR NV (SUCTIONS) ×2 IMPLANT
WATER STERILE IRR 1000ML POUR (IV SOLUTION) ×2 IMPLANT

## 2021-11-21 NOTE — Consult Note (Signed)
ORTHOPAEDIC CONSULTATION  REQUESTING PHYSICIAN: Reubin Milan, MD  PCP:  Mosie Lukes, MD  Chief Complaint: left hip injury   HPI: Shannon [name] is a 76 y.o. female  with a PMH  breast cancer, interstitial cystitis, left-sided back pain, bilateral TKA, mild Alzheimer's disease, vitamin D deficiency who presents with pain in her left hip. She says she had a mechanical fall 2 days ago. She missed the bottom step and fell over onto her left side.  She is it was hurting a little bit at yesterday but this morning it was much more painful.  She is not able to bear weight. She denies any other injuries from the fall. She says she did not hit her head. No neck or back pain. She is not anticoagulants. X-rays showed a left femoral neck fracture. Orthopedics was consulted.   She lives at home with her husband. She does not usually use any assistive devices. She does her own grocery shopping.   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 Echo 2012: EF 45-50%;  b. Lex MV 2/12:  low risk, apical defect (small area of ischemia vs shifting breast atten);  c.  Echo 7/12: Normal wall thickness, EF 60-65%, normal wall motion, grade 1 diastolic dysfunction, mild LAE, PASP 32;   d. Lex MV 11/13:  EF 76%, no ischemia   OA (osteoarthritis) 09/07/2011   Obesity 09/28/2014   Osteoporosis 03/07/2011   DEXA T score -2.6 AP spine 03/07/11    Formatting of this note might be different from the original. Formatting of this note might be different from the original. DEXA T score -2.6 AP spine 03/07/11   Last Assessment & Plan:  Formatting of this note might be different from the original. Encouraged to get adequate exercise, calcium and vitamin d intake   Pain in joint, ankle and foot 09/28/2014   Palpitation 10/28/2020   Personal history of chemotherapy 2012   Personal history of radiation therapy 2012   Prediabetes 06/15/2009   Recurrent falls  12/28/2015   Right knee pain 07/10/2020   Shortness of breath    UTI (urinary tract infection) 03/03/2012   Vertebral fracture, osteoporotic, sequela 04/05/2016   Vitamin D deficiency 06/14/2017   Supplement and monitor   Past Surgical History:  Procedure Laterality Date   BREAST LUMPECTOMY  04/2009   RIGHT FOR BREAST CANCER-CHEMO/RADIATION X 1 YEAR   BREAST REDUCTION SURGERY Bilateral 05/04/2014   Procedure: MAMMARY REDUCTION  (BREAST);  Surgeon: Cristine Polio, MD;  Location: [facility];  Service: Plastics;  Laterality: Bilateral;   CARPAL TUNNEL RELEASE     L wrist, ulnar nerve moved    CHOLECYSTECTOMY     CRANIOTOMY FOR TUMOR  2000   ELBOW SURGERY     left   KNEE ARTHROSCOPY Left 10/12/2014   Procedure: LEFT KNEE ARTHROSCOPY ;  Surgeon: Gaynelle Arabian, MD;  Location: [facility];  Service: Orthopedics;  Laterality: Left;   KYPHOPLASTY N/A 03/30/2016   Procedure: THORACIC 12 KYPHOPLASTY;  Surgeon: Phylliss Bob, MD;  Location: [facility];  Service: Orthopedics;  Laterality: N/A;  THORACIC 12 KYPHOPLASTY   LEFT HEART CATHETERIZATION WITH CORONARY ANGIOGRAM N/A 12/16/2013   Procedure: LEFT HEART CATHETERIZATION WITH CORONARY ANGIOGRAM;  Surgeon: Peter M [name], MD;  Location: [facility];  Service: Cardiovascular;  Laterality: N/A;   LIPOMA EXCISION  03/28/2009   right leg   MELANOMA EXCISION     PORT-A-CATH REMOVAL  11/30/2010   Streck   porta cath     PORTACATH PLACEMENT  may 2011   REDUCTION MAMMAPLASTY Bilateral    SYNOVECTOMY Left 10/12/2014   Procedure: WITH SYNOVECTOMY;  Surgeon: Gaynelle Arabian, MD;  Location: [facility];  Service: Orthopedics;  Laterality: Left;   TONSILLECTOMY  1958   TOTAL KNEE ARTHROPLASTY  02/05/2012   Procedure: TOTAL KNEE ARTHROPLASTY;  Surgeon: Gearlean Alf, MD;  Location: [facility];  Service: Orthopedics;  Laterality: Right;   TOTAL KNEE ARTHROPLASTY Left 02/10/2013   Procedure: LEFT TOTAL KNEE ARTHROPLASTY;  Surgeon: Gearlean Alf, MD;  Location: [facility];  Service: Orthopedics;  Laterality: Left;   total knee raplacement  01-2012   Right Knee   TOTAL KNEE REVISION Left 07/14/2020   Procedure: TOTAL KNEE REVISION;  Surgeon: Gaynelle Arabian, MD;  Location: [facility];  Service: Orthopedics;  Laterality: Left;   TUBAL LIGATION  1997   Social History   Socioeconomic History   Marital status: Married    Spouse name: Charnelle Bergeman   Number of children: 3   Years of education: 12   Highest education level: High school graduate  Occupational History   Occupation: boutique owner-retired    Employer: [employer]  Tobacco Use   Smoking status: Never    Passive  exposure: Never   Smokeless tobacco: Never  Vaping Use   Vaping Use: Never used  Substance and Sexual Activity   Alcohol use: No   Drug use: No   Sexual activity: Not Currently  Other Topics Concern   Not on file  Social History Narrative   Patient is right-handed. She lives with her husband in a 4 story home. They take care of their grown son with down syndrome who has had a stroke. She drinks 1-2 glasses of tea in the evenings. She does not formally exercise.      Social Determinants of Health   Financial Resource Strain: Low Risk  (07/25/2021)   Overall Financial Resource Strain (CARDIA)    Difficulty of Paying Living Expenses: Not very hard  Food  Insecurity: No Food Insecurity (12/30/2020)   Hunger Vital Sign    Worried About Running Out of Food in the Last Year: Never true    Ran Out of Food in the Last Year: Never true  Transportation Needs: No Transportation Needs (07/25/2021)   PRAPARE - Hydrologist (Medical): No    Lack of Transportation (Non-Medical): No  Physical Activity: Inactive (12/30/2020)   Exercise Vital Sign    Days of Exercise per Week: 0 days    Minutes of Exercise per Session: 0 min  Stress: Stress Concern Present (07/25/2021)   [place]    Feeling of Stress : Rather much  Social Connections: Socially Integrated (12/30/2020)   Social Connection and Isolation Panel [NHANES]    Frequency of Communication with Friends and Family: More than three times a week    Frequency of Social Gatherings with Friends and Family: More than three times a week    Attends Religious Services: 1 to 4 times per year    Active Member of [employer] or Organizations: Yes    Attends Archivist Meetings: 1 to 4 times per year    Marital Status: Married   Family History  Problem Relation Age of Onset   Breast cancer Mother        sarcoma   Lung cancer Mother    Hypertension Mother     Prostate cancer Father    Congestive Heart Failure Father    Heart attack Father    Prostate cancer Brother    Heart disease Maternal Grandfather        MI   Down syndrome Son    CVA Son    Stomach cancer Maternal Aunt    Dementia Maternal Aunt    Uterine cancer Maternal Aunt    Colon cancer Neg Hx    Allergies  Allergen Reactions   Dilaudid [Hydromorphone Hcl] Itching and Other (See Comments)    "Went Crazy"    Prior to Admission medications   Medication Sig Start Date End Date Taking? Authorizing Provider  albuterol (VENTOLIN HFA) 108 (90 Base) MCG/ACT inhaler INHALE 1-2 PUFFS INTO THE LUNGS EVERY 6 (SIX) HOURS AS NEEDED FOR WHEEZING OR SHORTNESS OF BREATH. Patient taking differently: Inhale 2 puffs into the lungs 4 (four) times daily as needed for wheezing or shortness of breath. 02/24/21  Yes Mosie Lukes, MD  Calcium Carbonate (CALCIUM 600 PO) Take 1 tablet by mouth in the morning and at bedtime.    [provider]  carvedilol (COREG) 25 MG tablet TAKE 1 TABLET TWICE DAILY WITH A MEAL 02/01/21   Lelon Perla, MD  cetirizine (ZYRTEC) 10 MG tablet Take 10 mg by mouth daily.    [provider]  diltiazem (CARDIZEM CD) 120 MG 24 hr capsule TAKE 1 CAPSULE BY MOUTH EVERY DAY 05/04/21   Lelon Perla, MD  docusate sodium (COLACE) 250 MG capsule Take 250 mg by mouth daily as needed for constipation.    [provider]  donepezil (ARICEPT) 10 MG tablet TAKE 1 TABLET AT BEDTIME 08/01/21   Rondel Jumbo, PA-C  Ergocalciferol 50 MCG (2000 UT) TABS Take 2,000 Units by mouth daily.    [provider]  famotidine (PEPCID) 20 MG tablet TAKE 1 TABLET TWICE DAILY 10/17/21   Mosie Lukes, MD  ferrous sulfate 325 (65 FE) MG tablet Take 325 mg by mouth daily with breakfast.    [provider]  folic acid (FOLVITE) 1 MG tablet Take 1 tablet (1 mg total) by mouth daily. 01/19/20   Mosie Lukes, MD  gabapentin (NEURONTIN) 300 MG capsule Take 1  capsule (300 mg total) by mouth 2 (two) times daily. 02/14/21   Pieter Partridge, DO  hydrOXYzine (ATARAX) 25 MG tablet TAKE 0.5-1 TABLETS (12.5-25 MG TOTAL) BY MOUTH 2 (TWO) TIMES DAILY AS NEEDED. 04/15/21   Mosie Lukes, MD  Multiple Vitamin (MULTIVITAMIN WITH MINERALS) TABS tablet Take 1 tablet by mouth daily.    [provider]  omeprazole (PRILOSEC) 20 MG capsule Take 20 mg by mouth daily.    [provider]  pentosan polysulfate (ELMIRON) 100 MG capsule Take 100 mg by mouth as directed. two at morning and two at night 02/07/12   Perkins, Alexzandrew L, PA-C  rosuvastatin (CRESTOR) 20 MG tablet TAKE 1 TABLET AT BEDTIME 11/02/21   Lelon Perla, MD  telmisartan (MICARDIS) 40 MG tablet Take 1 tablet (40 mg total) by mouth daily. 12/13/20   Lelon Perla, MD  venlafaxine XR (EFFEXOR-XR) 75 MG 24 hr capsule TAKE 3 CAPSULES EVERY DAY WITH BREAKFAST 08/31/21   Mosie Lukes, MD   DG Hip Unilat With Pelvis 2-3 Views Left  Result Date: 11/21/2021 CLINICAL DATA:  Fall 2 days ago. Left hip pain. Unable to ambulate. EXAM: DG HIP (WITH OR WITHOUT PELVIS) LV LEFT COMPARISON:  None Available. FINDINGS: A fracture through the neck of the left femur is displaced superiorly. Humeral head is located scratched at femoral head is located. No other acute osseous abnormality is present. Degenerative changes are present in the lower lumbar spine and SI joints. IMPRESSION: Displaced fracture through the neck of the left femur. Electronically Signed   By: San Morelle M.D.   On: 11/21/2021 10:47   DG Chest 2 View  Result Date: 11/21/2021 CLINICAL DATA:  Preoperative respiratory exam for hip fracture. EXAM: CHEST - 2 VIEW COMPARISON:  03/11/2020 FINDINGS: Lordotic positioning. Heart and mediastinal shadows are normal allowing technical features. Mild scarring/volume loss at the right lung base. The chest is otherwise clear. Old augmented fracture at the thoracolumbar junction region.  IMPRESSION: No active cardiopulmonary disease. Mild scarring/volume loss at the right lung base. Electronically Signed   By: Nelson Chimes M.D.   On: 11/21/2021 10:47   DG Knee Complete 4 Views Left  Result Date: 11/21/2021 CLINICAL DATA:  Fall with leg pain EXAM: LEFT KNEE - COMPLETE 3 VIEW COMPARISON:  None Available. FINDINGS: Prior total left knee arthroplasty. Hardware is intact. Evidence of fracture. Small joint effusion. Soft tissue swelling about the knee. IMPRESSION: Prior total left knee arthroplasty without evidence of fracture. Electronically Signed   By: Yetta Glassman M.D.   On: 11/21/2021 10:46    Positive ROS: All other systems have been reviewed and were otherwise negative with the exception of those mentioned in the HPI and as above.  Physical Exam: General: Alert, no acute distress. Oriented x3.  Cardiovascular: No pedal edema Respiratory: No cyanosis, no use of accessory musculature GI: No organomegaly, abdomen is soft and non-tender Skin: No lesions in the area of chief complaint Psychiatric: Patient is competent for consent with normal mood and affect Lymphatic: No axillary or cervical lymphadenopathy  MUSCULOSKELETAL: Examination of the left hip reveals no skin wounds or lesions. Shortening and external rotation noted. Pain with lateral hip palpation and hip movement.   Sensory and motor function intact in LE bilaterally including plantar flexion, dorsiflexion,  and EHL. Distal pedal pulses 2+ bilaterally. Calves soft and non-tender. Capillary refill <2 seconds.   Assessment: Displaced left femoral neck fracture.   Plan: I discussed the findings with the patient. She has an unstable left femoral neck fracture that will require surgical treatment for pain control and allow immediate immobilization out of bed. [facility] has already been consulted for admission and perioperative medical optimization. Appreciate their consult. Plan for left total hip arthroplasty today.  Discussed R/B/A. See risk statement. She has not had anything to eat or drink today. Patient to remain NPO. Hold chemical DVT prophylaxis. All questions solicited and answered.    The risks, benefits, and alternatives were discussed with the patient. There are risks associated with the surgery including, but not limited to, problems with anesthesia (death), infection, differences in leg length/angulation/rotation, fracture of bones, loosening or failure of implants, malunion, nonunion, hematoma (blood accumulation) which may require surgical drainage, blood clots, pulmonary embolism, nerve injury (foot drop), and blood vessel injury. The patient understands these risks and elects to proceed.    Charlott Rakes, PA-C    11/21/2021 1:08 PM

## 2021-11-21 NOTE — ED Triage Notes (Signed)
BIB EMS fell on Saturday, missed bottom step, pain in left hip, today can not get around, left rotation and shortening. Good pulse. 60-97%-[id number]/93 CBG 98

## 2021-11-21 NOTE — H&P (Signed)
History and Physical    Patient: Shannon [name] UXN:[id number] DOB: January 05, 1946 DOA: 11/21/2021 DOS: the patient was seen and examined on 11/21/2021 PCP: Mosie Lukes, MD  Patient coming from: Home  Chief Complaint:  Chief Complaint  Patient presents with   Fall   Hip Pain   HPI: Shannon [name] is a 76 y.o. female with medical history significant of abnormal nuclear stress test, history of chest pain, palpitations, asthma as a child, dyspnea on exertion, right breast cancer, left carpal tunnel syndrome, bilateral cataracts, closed fracture of maxilla, dilated cardiomyopathy with last echocardiogram showing an EF of 55 to 60% and grade 1 diastolic dysfunction, close to patient, craniopharyngioma, unspecified dermatitis, essential hypertension failed total left knee replacement, GERD, herpes zoster, measles, melanoma, mumps, hyperlipidemia, hyponatremia, interstitial cystitis, left-sided back pain, depression, neuropathy, GERD, hiatal hernia, mild Alzheimer's disease, prediabetes, recurrent falls, right knee pain, history of UTI, osteoporotic vertebral fractures, vitamin D deficiency who had a fall from the stairs at home Saturday evening developing some hip pain, but was able to sleep Saturday night, woke up on Sunday being able to walk and even stood at the kitchen for a while while cooking.  She went to bed Sunday evening and woke up this morning around 0100 with severe pain on the left hip area and inability to bear weight. He denied fever, chills, rhinorrhea, sore throat, wheezing or hemoptysis.  No chest pain, palpitations, diaphoresis, PND, orthopnea or pitting edema of the lower extremities.  No abdominal pain, nausea, emesis, diarrhea, constipation, melena or hematochezia.  No flank pain, dysuria or hematuria.  She gets occasional frequency.  No polyuria, polydipsia, polyphagia or blurred vision.  She has hassling erythema  ED course: Initial vital signs were temperature 97.7 F, pulse 55,  respiration 15, BP 151/70 mmHg O2 sat 95% on room air.  The patient received fentanyl 50 mcg IVP x1.  Lab work: CBC showed a white count 10.9, hemoglobin 11.7 g/dL platelets 194.  PT 13.3 and INR 1.0.  BMP with a glucose of 110 and calcium 8.4 mg/dL.  The rest of the BMP measurements were normal.  Imaging: No active cardiopulmonary disease on 2 view chest radiograph.  Left knee x-ray with no fractures and previous TKA.  Left hip x-ray showed displaced fracture through the neck of the left femur.   Review of Systems: As mentioned in the history of present illness. All other systems reviewed and are negative.  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 Echo 2012: EF 45-50%;  b. Lex MV 2/12:  low risk, apical defect (small area of ischemia vs shifting breast atten);  c.  Echo 7/12: Normal wall thickness, EF 60-65%, normal wall motion, grade 1 diastolic dysfunction, mild LAE, PASP 32;   d. Lex MV 11/13:  EF 76%, no ischemia   OA (osteoarthritis) 09/07/2011   Obesity 09/28/2014   Osteoporosis 03/07/2011   DEXA T score -2.6 AP spine 03/07/11    Formatting of this note might be different from the original. Formatting of this note might be different from the original. DEXA T score -2.6 AP spine 03/07/11   Last Assessment & Plan:  Formatting of this note might be different from the original. Encouraged to get adequate exercise, calcium and vitamin d intake   Pain in joint, ankle and foot 09/28/2014   Palpitation 10/28/2020   Personal history of chemotherapy 2012   Personal history of radiation therapy 2012   Prediabetes 06/15/2009   Recurrent falls 12/28/2015   Right knee pain 07/10/2020   Shortness of breath    UTI (urinary tract infection) 03/03/2012   Vertebral fracture, osteoporotic, sequela 04/05/2016   Vitamin D deficiency 06/14/2017   Supplement and monitor   Past Surgical History:  Procedure Laterality Date   BREAST LUMPECTOMY  04/2009   RIGHT FOR BREAST CANCER-CHEMO/RADIATION X 1 YEAR   BREAST REDUCTION SURGERY Bilateral 05/04/2014   Procedure: MAMMARY REDUCTION  (BREAST);  Surgeon: Cristine Polio, MD;  Location: [facility];  Service: Plastics;  Laterality: Bilateral;   CARPAL TUNNEL RELEASE     L wrist, ulnar nerve moved   CHOLECYSTECTOMY     CRANIOTOMY FOR TUMOR  2000   ELBOW SURGERY     left   KNEE ARTHROSCOPY Left 10/12/2014   Procedure: LEFT KNEE ARTHROSCOPY ;  Surgeon: Gaynelle Arabian, MD;  Location: [facility];  Service: Orthopedics;  Laterality: Left;   KYPHOPLASTY N/A 03/30/2016   Procedure: THORACIC 12 KYPHOPLASTY;  Surgeon: Phylliss Bob, MD;   Location: [facility];  Service: Orthopedics;  Laterality: N/A;  THORACIC 12 KYPHOPLASTY   LEFT HEART CATHETERIZATION WITH CORONARY ANGIOGRAM N/A 12/16/2013   Procedure: LEFT HEART CATHETERIZATION WITH CORONARY ANGIOGRAM;  Surgeon: Peter M [name], MD;  Location: [facility];  Service: Cardiovascular;  Laterality: N/A;   LIPOMA EXCISION  03/28/2009   right leg   MELANOMA EXCISION     PORT-A-CATH REMOVAL  11/30/2010   Streck   porta cath     PORTACATH PLACEMENT  may 2011   REDUCTION MAMMAPLASTY Bilateral    SYNOVECTOMY Left 10/12/2014   Procedure: WITH SYNOVECTOMY;  Surgeon: Gaynelle Arabian, MD;  Location: [facility];  Service: Orthopedics;  Laterality: Left;   TONSILLECTOMY  1958   TOTAL KNEE ARTHROPLASTY  02/05/2012   Procedure: TOTAL KNEE ARTHROPLASTY;  Surgeon: Gearlean Alf, MD;  Location: [facility];  Service: Orthopedics;  Laterality: Right;   TOTAL KNEE ARTHROPLASTY Left 02/10/2013   Procedure: LEFT TOTAL KNEE ARTHROPLASTY;  Surgeon: Gearlean Alf, MD;  Location: [facility];  Service: Orthopedics;  Laterality: Left;   total  knee raplacement  01-2012   Right Knee   TOTAL KNEE REVISION Left 07/14/2020   Procedure: TOTAL KNEE REVISION;  Surgeon: Gaynelle Arabian, MD;  Location: [facility];  Service: Orthopedics;  Laterality: Left;   TUBAL LIGATION  1997   Social History:  reports that she has never smoked. She has never been exposed to tobacco smoke. She has never used smokeless tobacco. She reports that she does not drink alcohol and does not use drugs.  Allergies  Allergen Reactions   Dilaudid [Hydromorphone Hcl] Itching and Other (See Comments)    "Went Crazy"     Family History  Problem Relation Age of Onset   Breast cancer Mother        sarcoma   Lung cancer Mother    Hypertension Mother    Prostate cancer Father    Congestive Heart Failure Father    Heart attack Father    Prostate cancer Brother    Heart disease Maternal Grandfather        MI   Down syndrome Son    CVA Son    Stomach  cancer Maternal Aunt    Dementia Maternal Aunt    Uterine cancer Maternal Aunt    Colon cancer Neg Hx     Prior to Admission medications   Medication Sig Start Date End Date Taking? Authorizing Provider  albuterol (VENTOLIN HFA) 108 (90 Base) MCG/ACT inhaler INHALE 1-2 PUFFS INTO THE LUNGS EVERY 6 (SIX) HOURS AS NEEDED FOR WHEEZING OR SHORTNESS OF BREATH. Patient taking differently: Inhale 2 puffs into the lungs 4 (four) times daily as needed for wheezing or shortness of breath. 02/24/21  Yes Mosie Lukes, MD  Calcium Carbonate (CALCIUM 600 PO) Take 1 tablet by mouth in the morning and at bedtime.    [provider]  carvedilol (COREG) 25 MG tablet TAKE 1 TABLET TWICE DAILY WITH A MEAL 02/01/21   Lelon Perla, MD  cetirizine (ZYRTEC) 10 MG tablet Take 10 mg by mouth daily.    [provider]  diltiazem (CARDIZEM CD) 120 MG 24 hr capsule TAKE 1 CAPSULE BY MOUTH EVERY DAY 05/04/21   Lelon Perla, MD  docusate sodium (COLACE) 250 MG capsule Take 250 mg by mouth daily as needed for constipation.    [provider]  donepezil (ARICEPT) 10 MG tablet TAKE 1 TABLET AT BEDTIME 08/01/21   Rondel Jumbo, PA-C  Ergocalciferol 50 MCG (2000 UT) TABS Take 2,000 Units by mouth daily.    [provider]  famotidine (PEPCID) 20 MG tablet TAKE 1 TABLET TWICE DAILY 10/17/21   Mosie Lukes, MD  ferrous sulfate 325 (65 FE) MG tablet Take 325 mg by mouth daily with breakfast.    [provider]  folic acid (FOLVITE) 1 MG tablet Take 1 tablet (1 mg total) by mouth daily. 01/19/20   Mosie Lukes, MD  gabapentin (NEURONTIN) 300 MG capsule Take 1 capsule (300 mg total) by mouth 2 (two) times daily. 02/14/21   Pieter Partridge, DO  hydrOXYzine (ATARAX) 25 MG tablet TAKE 0.5-1 TABLETS (12.5-25 MG TOTAL) BY MOUTH 2 (TWO) TIMES DAILY AS NEEDED. 04/15/21   Mosie Lukes, MD  Multiple Vitamin (MULTIVITAMIN WITH MINERALS) TABS tablet Take 1 tablet by mouth daily.     [provider]  omeprazole (PRILOSEC) 20 MG capsule Take 20 mg by mouth daily.    [provider]  pentosan polysulfate (ELMIRON) 100 MG capsule Take 100 mg by mouth as directed.  two at morning and two at night 02/07/12   Perkins, Alexzandrew L, PA-C  rosuvastatin (CRESTOR) 20 MG tablet TAKE 1 TABLET AT BEDTIME 11/02/21   Lelon Perla, MD  telmisartan (MICARDIS) 40 MG tablet Take 1 tablet (40 mg total) by mouth daily. 12/13/20   Lelon Perla, MD  venlafaxine XR (EFFEXOR-XR) 75 MG 24 hr capsule TAKE 3 CAPSULES EVERY DAY WITH BREAKFAST 08/31/21   Mosie Lukes, MD    Physical Exam: Vitals:   11/21/21 0858 11/21/21 0902 11/21/21 1045 11/21/21 1145  BP:  (!) 151/70 (!) 149/63 (!) 152/70  Pulse:  (!) 55 (!) 59 60  Resp:  '15 15 16  '$ Temp:  97.7 F (36.5 C)    TempSrc:  Oral    SpO2:  95% 100% 100%  Weight: 70.3 kg     Height: '5\' 1"'$  (1.549 m)      Physical Exam Vitals and nursing note reviewed.  Constitutional:      General: She is awake. She is not in acute distress.    Appearance: Normal appearance. She is overweight.  HENT:     Head: Normocephalic.     Nose: No rhinorrhea.     Mouth/Throat:     Mouth: Mucous membranes are moist.  Eyes:     General: No scleral icterus.    Pupils: Pupils are equal, round, and reactive to light.  Neck:     Vascular: No JVD.  Cardiovascular:     Rate and Rhythm: Normal rate and regular rhythm.     Heart sounds: S1 normal and S2 normal.  Pulmonary:     Effort: Pulmonary effort is normal.     Breath sounds: No wheezing, rhonchi or rales.  Abdominal:     General: Bowel sounds are normal.     Palpations: Abdomen is soft.     Tenderness: There is no abdominal tenderness.  Musculoskeletal:     Cervical back: Neck supple.     Left hip: Tenderness present. Decreased range of motion. Decreased strength.     Right lower leg: No edema.     Left lower leg: No edema.  Skin:    General: Skin is warm and dry.  Neurological:      General: No focal deficit present.     Mental Status: She is alert and oriented to person, place, and time.  Psychiatric:        Mood and Affect: Mood normal.        Behavior: Behavior normal. Behavior is cooperative.     Data Reviewed:  There are no new results to review at this time.  Echocardiogram 01/12/2020. IMPRESSIONS    1. Left ventricular ejection fraction, by estimation, is 55 to 60%. The  left ventricle has normal function. The left ventricle has no regional  wall motion abnormalities. Left ventricular diastolic parameters are  consistent with Grade I diastolic  dysfunction (impaired relaxation).   2. Right ventricular systolic function is normal. The right ventricular  size is normal.   3. The mitral valve is normal in structure. No evidence of mitral valve  regurgitation. No evidence of mitral stenosis.   4. The aortic valve is normal in structure. Aortic valve regurgitation is  not visualized. No aortic stenosis is present.   5. The inferior vena cava is normal in size with greater than 50%  respiratory variability, suggesting right atrial pressure of 3 mmHg.   EKG: Vent. rate 59 BPM PR interval 148 ms QRS duration 77 ms QT/QTcB  426/422 ms P-R-T axes -2 -25 27 Sinus rhythm Inferior infarct, old  Assessment and Plan: Principal Problem:   Fall at home, initial encounter Complicated by:   Closed left hip fracture, initial encounter ([place]) Admit to telemetry/inpatient. Ice area as needed. Buck's traction per protocol. Analgesics as needed. Antiemetics as needed. Consult TOC team. Consult nutritional services. PT evaluation after surgery. Orthopedic surgery evaluation appreciated.  Active Problems:   Hyperlipidemia, mixed Continue rosuvastatin 20 mg p.o. daily.    Essential hypertension Continue carvedilol 25 mg p.o. daily Continue telmisartan 40 mg p.o. daily. Continue diltiazem 120 mg p.o. at bedtime.    GERD (gastroesophageal reflux  disease) Continue PPI.    Prediabetes Last hemoglobin A1c 5.6%. Carbohydrate modified diet. CBG monitoring before meals and bedtime.    Dermatitis (under right breast) Likely fungal in nature. Trial of nystatin powder 2-3 times daily.    Sleep apnea CPAP nightly.    Depression with anxiety Continue Effexor 225 mg p.o. twice daily. Continue hydroxyzine as needed. Follow-up with PCP and/or behavioral health as an OP.    Dementia ([place]) Mild.   Very functional. Continue donezepil 10 mg p.o. daily. Supportive care.    Grade I diastolic dysfunction No signs of decompensation. Continue carvedilol 25 mg p.o. daily Continue telmisartan 40 mg p.o. daily.    Palpitation On carvedilol and diltiazem.    Osteoporosis Will need regular calcium and vitamin D supplementation. Follow-up with primary care provider as an outpatient.    Advance Care Planning:   Code Status: Full Code   Consults: Orthopedic surgery (Dr. Lyla Glassing).  Family Communication:   Severity of Illness: The appropriate patient status for this patient is INPATIENT. Inpatient status is judged to be reasonable and necessary in order to provide the required intensity of service to ensure the patient's safety. The patient's presenting symptoms, physical exam findings, and initial radiographic and laboratory data in the context of their chronic comorbidities is felt to place them at high risk for further clinical deterioration. Furthermore, it is not anticipated that the patient will be medically stable for discharge from the hospital within 2 midnights of admission.   * I certify that at the point of admission it is my clinical judgment that the patient will require inpatient hospital care spanning beyond 2 midnights from the point of admission due to high intensity of service, high risk for further deterioration and high frequency of surveillance required.*  Author: Reubin Milan, MD 11/21/2021 12:38 PM  For on  call review [email or web address].   This document was prepared using Dragon voice recognition software and may contain some unintended transcription errors.

## 2021-11-21 NOTE — Op Note (Signed)
OPERATIVE REPORT  SURGEON: Rod Can, MD   ASSISTANT: Larene Pickett, PA-C  PREOPERATIVE DIAGNOSIS: Displaced Left femoral neck fracture.   POSTOPERATIVE DIAGNOSIS: Displaced Left femoral neck fracture.   PROCEDURE: Left total hip arthroplasty, anterior approach.   IMPLANTS: Biomet Taperloc Reduced Distal stem, size 13x135m, high offset. Biomet G7 OsseoTi Cup, size 50 mm. Biomet Vivacit-E liner, size 36 mm, D, neutral. Biomet metal head ball, size 36 - 3 mm. Zimmer 1.8 mm adult reconstruction cable x1.  ANESTHESIA:  General  ANTIBIOTICS: 2g ancef.  ESTIMATED BLOOD LOSS:-200 mL    DRAINS: None.  COMPLICATIONS: None   CONDITION: PACU - hemodynamically stable.   BRIEF CLINICAL NOTE: Shannon MULLINSis a 76y.o. female with a displaced Left femoral neck fracture. The patient was admitted to the hospitalist service and underwent perioperative risk stratification and medical optimization. The risks, benefits, and alternatives to total hip arthroplasty were explained, and the patient elected to proceed.  PROCEDURE IN DETAIL: The patient was taken to the operating room and general anesthesia was induced on the hospital bed.  The patient was then positioned on the Hana table.  All bony prominences were well padded.  The hip was prepped and draped in the normal sterile surgical fashion.  A time-out was called verifying side and site of surgery. Antibiotics were given within 60 minutes of beginning the procedure.   Bikini incision was made, and the direct anterior approach to the hip was performed through the Hueter interval.  Lateral femoral circumflex vessels were treated with the Auqumantys. The anterior capsule was exposed and an inverted T capsulotomy was made.  Fracture hematoma was encountered and evacuated. The patient was found to have a comminuted, vertical Left subcapital femoral neck fracture.  I freshened the femoral neck cut with a saw.  I removed the femoral neck fragment.  A  corkscrew was placed into the head and the head was removed.  This was passed to the back table and was measured. The pubofemoral ligament was released subperiosteally to the lesser trochanter. There was a v-shaped defect in the calcar. I elected to place a single subperiosteal prophylactic adult reconstruction cable just superior to the lesser trochanter.  Acetabular exposure was achieved, and the pulvinar and labrum were excised. Sequential reaming of the acetabulum was then performed up to a size 49 mm reamer under direct visulization. A 50 mm cup was then opened and impacted into place at approximately 40 degrees of abduction and 20 degrees of anteversion. The final polyethylene liner was impacted into place and acetabular osteophytes were removed.    I then gained femoral exposure taking care to protect the abductors and greater trochanter.  This was performed using standard external rotation, extension, and adduction.  A cookie cutter was used to enter the femoral canal, and then the femoral canal finder was placed.  Sequential broaching was performed up to a size 13.  Calcar planer was used on the femoral neck remnant.  I placed a high offset neck and a trial head ball.  The hip was reduced.  Leg lengths and offset were checked fluoroscopically.  The hip was dislocated and trial components were removed.  The final implants were placed, and the hip was reduced.  Fluoroscopy was used to confirm component position and leg lengths.  At 90 degrees of external rotation and full extension, the hip was stable to an anterior directed force.   The wound was copiously irrigated with Irrisept solution and normal saline using pule lavage.  Marcaine solution was injected into the periarticular soft tissue.  The wound was closed in layers using #1 Stratafix for the fascia, 2-0 Vicryl for the subcutaneous fat, 2-0 Monocryl for the deep dermal layer, and staples + Dermabond for the skin.  Once the glue was fully dried,  an Aquacell Ag dressing was applied.  The patient was transported to the recovery room in stable condition.  Sponge, needle, and instrument counts were correct at the end of the case x2.  The patient tolerated the procedure well and there were no known complications.  Please note that a surgical assistant was a medical necessity for this procedure to perform it in a safe and expeditious manner. Assistant was necessary to provide appropriate retraction of vital neurovascular structures, to prevent femoral fracture, and to allow for anatomic placement of the prosthesis.

## 2021-11-21 NOTE — Discharge Instructions (Signed)
? ?Dr. Brian Swinteck ?Joint Replacement Specialist ?[facility] ?[address] ?[place], [place] [address] ?[phone] ? ? ?TOTAL HIP REPLACEMENT POSTOPERATIVE DIRECTIONS ? ? ? ?Hip Rehabilitation, Guidelines Following Surgery  ? ?WEIGHT BEARING ?Weight bearing as tolerated with assist device (walker, cane, etc) as directed, use it as long as suggested by your surgeon or therapist, typically at least 4-6 weeks. ? ?The results of a hip operation are greatly improved after range of motion and muscle strengthening exercises. Follow all safety measures which are given to protect your hip. If any of these exercises cause increased pain or swelling in your joint, decrease the amount until you are comfortable again. Then slowly increase the exercises. Call your caregiver if you have problems or questions.  ? ?HOME CARE INSTRUCTIONS  ?Most of the following instructions are designed to prevent the dislocation of your new hip.  ?Remove items at home which could result in a fall. This includes throw rugs or furniture in walking pathways.  ?Continue medications as instructed at time of discharge. ?You may have some home medications which will be placed on hold until you complete the course of blood thinner medication. ?You may start showering once you are discharged home. Do not remove your dressing. ?Do not put on socks or shoes without following the instructions of your caregivers.   ?Sit on chairs with arms. Use the chair arms to help push yourself up when arising.  ?Arrange for the use of a toilet seat elevator so you are not sitting low.  ?Walk with walker as instructed.  ?You may resume a sexual relationship in one month or when given the OK by your caregiver.  ?Use walker as long as suggested by your caregivers.  ?You may put full weight on your legs and walk as much as is comfortable. ?Avoid periods of inactivity such as sitting longer than an hour when not asleep. This helps prevent blood  clots.  ?You may return to work once you are cleared by your surgeon.  ?Do not drive a car for 6 weeks or until released by your surgeon.  ?Do not drive while taking narcotics.  ?Wear elastic stockings for two weeks following surgery during the day but you may remove then at night.  ?Make sure you keep all of your appointments after your operation with all of your doctors and caregivers. You should call the office at the above phone number and make an appointment for approximately two weeks after the date of your surgery. ?Please pick up a stool softener and laxative for home use as long as you are requiring pain medications. ?ICE to the affected hip every three hours for 30 minutes at a time and then as needed for pain and swelling. Continue to use ice on the hip for pain and swelling from surgery. You may notice swelling that will progress down to the foot and ankle.  This is normal after surgery.  Elevate the leg when you are not up walking on it.   ?It is important for you to complete the blood thinner medication as prescribed by your doctor. ?Continue to use the breathing machine which will help keep your temperature down.  It is common for your temperature to cycle up and down following surgery, especially at night when you are not up moving around and exerting yourself.  The breathing machine keeps your lungs expanded and your temperature down. ? ?RANGE OF MOTION AND STRENGTHENING EXERCISES  ?These exercises are designed to help you   keep full movement of your hip joint. Follow your caregiver's or physical therapist's instructions. Perform all exercises about fifteen times, three times per day or as directed. Exercise both hips, even if you have had only one joint replacement. These exercises can be done on a training (exercise) mat, on the floor, on a table or on a bed. Use whatever works the best and is most comfortable for you. Use music or television while you are exercising so that the exercises are a  pleasant break in your day. This will make your life better with the exercises acting as a break in routine you can look forward to.  ?Lying on your back, slowly slide your foot toward your buttocks, raising your knee up off the floor. Then slowly slide your foot back down until your leg is straight again.  ?Lying on your back spread your legs as far apart as you can without causing discomfort.  ?Lying on your side, raise your upper leg and foot straight up from the floor as far as is comfortable. Slowly lower the leg and repeat.  ?Lying on your back, tighten up the muscle in the front of your thigh (quadriceps muscles). You can do this by keeping your leg straight and trying to raise your heel off the floor. This helps strengthen the largest muscle supporting your knee.  ?Lying on your back, tighten up the muscles of your buttocks both with the legs straight and with the knee bent at a comfortable angle while keeping your heel on the floor.  ? ?SKILLED REHAB INSTRUCTIONS: ?If the patient is transferred to a skilled rehab facility following release from the hospital, a list of the current medications will be sent to the facility for the patient to continue.  When discharged from the skilled rehab facility, please have the facility set up the patient's Home Health Physical Therapy prior to being released. Also, the skilled facility will be responsible for providing the patient with their medications at time of release from the facility to include their pain medication and their blood thinner medication. If the patient is still at the rehab facility at time of the two week follow up appointment, the skilled rehab facility will also need to assist the patient in arranging follow up appointment in our office and any transportation needs. ? ?POST-OPERATIVE OPIOID TAPER INSTRUCTIONS: ?It is important to wean off of your opioid medication as soon as possible. If you do not need pain medication after your surgery it is ok  to stop day one. ?Opioids include: ?Codeine, Hydrocodone(Norco, Vicodin), Oxycodone(Percocet, oxycontin) and hydromorphone amongst others.  ?Long term and even short term use of opiods can cause: ?Increased pain response ?Dependence ?Constipation ?Depression ?Respiratory depression ?And more.  ?Withdrawal symptoms can include ?Flu like symptoms ?Nausea, vomiting ?And more ?Techniques to manage these symptoms ?Hydrate well ?Eat regular healthy meals ?Stay active ?Use relaxation techniques(deep breathing, meditating, yoga) ?Do Not substitute Alcohol to help with tapering ?If you have been on opioids for less than two weeks and do not have pain than it is ok to stop all together.  ?Plan to wean off of opioids ?This plan should start within one week post op of your joint replacement. ?Maintain the same interval or time between taking each dose and first decrease the dose.  ?Cut the total daily intake of opioids by one tablet each day ?Next start to increase the time between doses. ?The last dose that should be eliminated is the evening dose.  ? ? ?MAKE   SURE YOU:  ?Understand these instructions.  ?Will watch your condition.  ?Will get help right away if you are not doing well or get worse. ? ?Pick up stool softner and laxative for home use following surgery while on pain medications. ?Do not remove your dressing. ?The dressing is waterproof--it is OK to take showers. ?Continue to use ice for pain and swelling after surgery. ?Do not use any lotions or creams on the incision until instructed by your surgeon. ?Total Hip Protocol. ? ?

## 2021-11-21 NOTE — H&P (View-Only) (Signed)
ORTHOPAEDIC CONSULTATION  REQUESTING PHYSICIAN: Reubin Milan, MD  PCP:  Mosie Lukes, MD  Chief Complaint: left hip injury   HPI: Shannon [name] is a 75 y.o. female  with a PMH  breast cancer, interstitial cystitis, left-sided back pain, bilateral TKA, mild Alzheimer's disease, vitamin D deficiency who presents with pain in her left hip. She says she had a mechanical fall 2 days ago. She missed the bottom step and fell over onto her left side.  She is it was hurting a little bit at yesterday but this morning it was much more painful.  She is not able to bear weight. She denies any other injuries from the fall. She says she did not hit her head. No neck or back pain. She is not anticoagulants. X-rays showed a left femoral neck fracture. Orthopedics was consulted.   She lives at home with her husband. She does not usually use any assistive devices. She does her own grocery shopping.   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 Echo 2012: EF 45-50%;  b. Lex MV 2/12:  low risk, apical defect (small area of ischemia vs shifting breast atten);  c.  Echo 7/12: Normal wall thickness, EF 60-65%, normal wall motion, grade 1 diastolic dysfunction, mild LAE, PASP 32;   d. Lex MV 11/13:  EF 76%, no ischemia   OA (osteoarthritis) 09/07/2011   Obesity 09/28/2014   Osteoporosis 03/07/2011   DEXA T score -2.6 AP spine 03/07/11    Formatting of this note might be different from the original. Formatting of this note might be different from the original. DEXA T score -2.6 AP spine 03/07/11   Last Assessment & Plan:  Formatting of this note might be different from the original. Encouraged to get adequate exercise, calcium and vitamin d intake   Pain in joint, ankle and foot 09/28/2014   Palpitation 10/28/2020   Personal history of chemotherapy 2012   Personal history of radiation therapy 2012   Prediabetes 06/15/2009   Recurrent falls  12/28/2015   Right knee pain 07/10/2020   Shortness of breath    UTI (urinary tract infection) 03/03/2012   Vertebral fracture, osteoporotic, sequela 04/05/2016   Vitamin D deficiency 06/14/2017   Supplement and monitor   Past Surgical History:  Procedure Laterality Date   BREAST LUMPECTOMY  04/2009   RIGHT FOR BREAST CANCER-CHEMO/RADIATION X 1 YEAR   BREAST REDUCTION SURGERY Bilateral 05/04/2014   Procedure: MAMMARY REDUCTION  (BREAST);  Surgeon: Cristine Polio, MD;  Location: [facility];  Service: Plastics;  Laterality: Bilateral;   CARPAL TUNNEL RELEASE     L wrist, ulnar nerve moved    CHOLECYSTECTOMY     CRANIOTOMY FOR TUMOR  2000   ELBOW SURGERY     left   KNEE ARTHROSCOPY Left 10/12/2014   Procedure: LEFT KNEE ARTHROSCOPY ;  Surgeon: Gaynelle Arabian, MD;  Location: [facility];  Service: Orthopedics;  Laterality: Left;   KYPHOPLASTY N/A 03/30/2016   Procedure: THORACIC 12 KYPHOPLASTY;  Surgeon: Phylliss Bob, MD;  Location: [facility];  Service: Orthopedics;  Laterality: N/A;  THORACIC 12 KYPHOPLASTY   LEFT HEART CATHETERIZATION WITH CORONARY ANGIOGRAM N/A 12/16/2013   Procedure: LEFT HEART CATHETERIZATION WITH CORONARY ANGIOGRAM;  Surgeon: Peter M [name], MD;  Location: [facility];  Service: Cardiovascular;  Laterality: N/A;   LIPOMA EXCISION  03/28/2009   right leg   MELANOMA EXCISION     PORT-A-CATH REMOVAL  11/30/2010   Streck   porta cath     PORTACATH PLACEMENT  may 2011   REDUCTION MAMMAPLASTY Bilateral    SYNOVECTOMY Left 10/12/2014   Procedure: WITH SYNOVECTOMY;  Surgeon: Gaynelle Arabian, MD;  Location: [facility];  Service: Orthopedics;  Laterality: Left;   TONSILLECTOMY  1958   TOTAL KNEE ARTHROPLASTY  02/05/2012   Procedure: TOTAL KNEE ARTHROPLASTY;  Surgeon: Gearlean Alf, MD;  Location: [facility];  Service: Orthopedics;  Laterality: Right;   TOTAL KNEE ARTHROPLASTY Left 02/10/2013   Procedure: LEFT TOTAL KNEE ARTHROPLASTY;  Surgeon: Gearlean Alf, MD;  Location: [facility];  Service: Orthopedics;  Laterality: Left;   total knee raplacement  01-2012   Right Knee   TOTAL KNEE REVISION Left 07/14/2020   Procedure: TOTAL KNEE REVISION;  Surgeon: Gaynelle Arabian, MD;  Location: [facility];  Service: Orthopedics;  Laterality: Left;   TUBAL LIGATION  1997   Social History   Socioeconomic History   Marital status: Married    Spouse name: Relena Ivancic   Number of children: 3   Years of education: 12   Highest education level: High school graduate  Occupational History   Occupation: boutique owner-retired    Employer: [employer]  Tobacco Use   Smoking status: Never    Passive  exposure: Never   Smokeless tobacco: Never  Vaping Use   Vaping Use: Never used  Substance and Sexual Activity   Alcohol use: No   Drug use: No   Sexual activity: Not Currently  Other Topics Concern   Not on file  Social History Narrative   Patient is right-handed. She lives with her husband in a 4 story home. They take care of their grown son with down syndrome who has had a stroke. She drinks 1-2 glasses of tea in the evenings. She does not formally exercise.      Social Determinants of Health   Financial Resource Strain: Low Risk  (07/25/2021)   Overall Financial Resource Strain (CARDIA)    Difficulty of Paying Living Expenses: Not very hard  Food  Insecurity: No Food Insecurity (12/30/2020)   Hunger Vital Sign    Worried About Running Out of Food in the Last Year: Never true    Ran Out of Food in the Last Year: Never true  Transportation Needs: No Transportation Needs (07/25/2021)   PRAPARE - Hydrologist (Medical): No    Lack of Transportation (Non-Medical): No  Physical Activity: Inactive (12/30/2020)   Exercise Vital Sign    Days of Exercise per Week: 0 days    Minutes of Exercise per Session: 0 min  Stress: Stress Concern Present (07/25/2021)   [place]    Feeling of Stress : Rather much  Social Connections: Socially Integrated (12/30/2020)   Social Connection and Isolation Panel [NHANES]    Frequency of Communication with Friends and Family: More than three times a week    Frequency of Social Gatherings with Friends and Family: More than three times a week    Attends Religious Services: 1 to 4 times per year    Active Member of [employer] or Organizations: Yes    Attends Archivist Meetings: 1 to 4 times per year    Marital Status: Married   Family History  Problem Relation Age of Onset   Breast cancer Mother        sarcoma   Lung cancer Mother    Hypertension Mother     Prostate cancer Father    Congestive Heart Failure Father    Heart attack Father    Prostate cancer Brother    Heart disease Maternal Grandfather        MI   Down syndrome Son    CVA Son    Stomach cancer Maternal Aunt    Dementia Maternal Aunt    Uterine cancer Maternal Aunt    Colon cancer Neg Hx    Allergies  Allergen Reactions   Dilaudid [Hydromorphone Hcl] Itching and Other (See Comments)    "Went Crazy"    Prior to Admission medications   Medication Sig Start Date End Date Taking? Authorizing Provider  albuterol (VENTOLIN HFA) 108 (90 Base) MCG/ACT inhaler INHALE 1-2 PUFFS INTO THE LUNGS EVERY 6 (SIX) HOURS AS NEEDED FOR WHEEZING OR SHORTNESS OF BREATH. Patient taking differently: Inhale 2 puffs into the lungs 4 (four) times daily as needed for wheezing or shortness of breath. 02/24/21  Yes Mosie Lukes, MD  Calcium Carbonate (CALCIUM 600 PO) Take 1 tablet by mouth in the morning and at bedtime.    [provider]  carvedilol (COREG) 25 MG tablet TAKE 1 TABLET TWICE DAILY WITH A MEAL 02/01/21   Lelon Perla, MD  cetirizine (ZYRTEC) 10 MG tablet Take 10 mg by mouth daily.    [provider]  diltiazem (CARDIZEM CD) 120 MG 24 hr capsule TAKE 1 CAPSULE BY MOUTH EVERY DAY 05/04/21   Lelon Perla, MD  docusate sodium (COLACE) 250 MG capsule Take 250 mg by mouth daily as needed for constipation.    [provider]  donepezil (ARICEPT) 10 MG tablet TAKE 1 TABLET AT BEDTIME 08/01/21   Rondel Jumbo, PA-C  Ergocalciferol 50 MCG (2000 UT) TABS Take 2,000 Units by mouth daily.    [provider]  famotidine (PEPCID) 20 MG tablet TAKE 1 TABLET TWICE DAILY 10/17/21   Mosie Lukes, MD  ferrous sulfate 325 (65 FE) MG tablet Take 325 mg by mouth daily with breakfast.    [provider]  folic acid (FOLVITE) 1 MG tablet Take 1 tablet (1 mg total) by mouth daily. 01/19/20   Mosie Lukes, MD  gabapentin (NEURONTIN) 300 MG capsule Take 1  capsule (300 mg total) by mouth 2 (two) times daily. 02/14/21   Pieter Partridge, DO  hydrOXYzine (ATARAX) 25 MG tablet TAKE 0.5-1 TABLETS (12.5-25 MG TOTAL) BY MOUTH 2 (TWO) TIMES DAILY AS NEEDED. 04/15/21   Mosie Lukes, MD  Multiple Vitamin (MULTIVITAMIN WITH MINERALS) TABS tablet Take 1 tablet by mouth daily.    [provider]  omeprazole (PRILOSEC) 20 MG capsule Take 20 mg by mouth daily.    [provider]  pentosan polysulfate (ELMIRON) 100 MG capsule Take 100 mg by mouth as directed. two at morning and two at night 02/07/12   Perkins, Alexzandrew L, PA-C  rosuvastatin (CRESTOR) 20 MG tablet TAKE 1 TABLET AT BEDTIME 11/02/21   Lelon Perla, MD  telmisartan (MICARDIS) 40 MG tablet Take 1 tablet (40 mg total) by mouth daily. 12/13/20   Lelon Perla, MD  venlafaxine XR (EFFEXOR-XR) 75 MG 24 hr capsule TAKE 3 CAPSULES EVERY DAY WITH BREAKFAST 08/31/21   Mosie Lukes, MD   DG Hip Unilat With Pelvis 2-3 Views Left  Result Date: 11/21/2021 CLINICAL DATA:  Fall 2 days ago. Left hip pain. Unable to ambulate. EXAM: DG HIP (WITH OR WITHOUT PELVIS) LV LEFT COMPARISON:  None Available. FINDINGS: A fracture through the neck of the left femur is displaced superiorly. Humeral head is located scratched at femoral head is located. No other acute osseous abnormality is present. Degenerative changes are present in the lower lumbar spine and SI joints. IMPRESSION: Displaced fracture through the neck of the left femur. Electronically Signed   By: San Morelle M.D.   On: 11/21/2021 10:47   DG Chest 2 View  Result Date: 11/21/2021 CLINICAL DATA:  Preoperative respiratory exam for hip fracture. EXAM: CHEST - 2 VIEW COMPARISON:  03/11/2020 FINDINGS: Lordotic positioning. Heart and mediastinal shadows are normal allowing technical features. Mild scarring/volume loss at the right lung base. The chest is otherwise clear. Old augmented fracture at the thoracolumbar junction region.  IMPRESSION: No active cardiopulmonary disease. Mild scarring/volume loss at the right lung base. Electronically Signed   By: Nelson Chimes M.D.   On: 11/21/2021 10:47   DG Knee Complete 4 Views Left  Result Date: 11/21/2021 CLINICAL DATA:  Fall with leg pain EXAM: LEFT KNEE - COMPLETE 3 VIEW COMPARISON:  None Available. FINDINGS: Prior total left knee arthroplasty. Hardware is intact. Evidence of fracture. Small joint effusion. Soft tissue swelling about the knee. IMPRESSION: Prior total left knee arthroplasty without evidence of fracture. Electronically Signed   By: Yetta Glassman M.D.   On: 11/21/2021 10:46    Positive ROS: All other systems have been reviewed and were otherwise negative with the exception of those mentioned in the HPI and as above.  Physical Exam: General: Alert, no acute distress. Oriented x3.  Cardiovascular: No pedal edema Respiratory: No cyanosis, no use of accessory musculature GI: No organomegaly, abdomen is soft and non-tender Skin: No lesions in the area of chief complaint Psychiatric: Patient is competent for consent with normal mood and affect Lymphatic: No axillary or cervical lymphadenopathy  MUSCULOSKELETAL: Examination of the left hip reveals no skin wounds or lesions. Shortening and external rotation noted. Pain with lateral hip palpation and hip movement.   Sensory and motor function intact in LE bilaterally including plantar flexion, dorsiflexion,  and EHL. Distal pedal pulses 2+ bilaterally. Calves soft and non-tender. Capillary refill <2 seconds.   Assessment: Displaced left femoral neck fracture.   Plan: I discussed the findings with the patient. She has an unstable left femoral neck fracture that will require surgical treatment for pain control and allow immediate immobilization out of bed. [facility] has already been consulted for admission and perioperative medical optimization. Appreciate their consult. Plan for left total hip arthroplasty today.  Discussed R/B/A. See risk statement. She has not had anything to eat or drink today. Patient to remain NPO. Hold chemical DVT prophylaxis. All questions solicited and answered.    The risks, benefits, and alternatives were discussed with the patient. There are risks associated with the surgery including, but not limited to, problems with anesthesia (death), infection, differences in leg length/angulation/rotation, fracture of bones, loosening or failure of implants, malunion, nonunion, hematoma (blood accumulation) which may require surgical drainage, blood clots, pulmonary embolism, nerve injury (foot drop), and blood vessel injury. The patient understands these risks and elects to proceed.    Charlott Rakes, PA-C    11/21/2021 1:08 PM

## 2021-11-21 NOTE — ED Provider Notes (Signed)
[facility] DEPT Provider Note   CSN: [id number] Arrival date & time: 11/21/21  4098     History  Chief Complaint  Patient presents with   Fall   Hip Pain    Shannon [name] is a 76 y.o. female.  Patient is a 76 year old female who presents with pain in her left hip.  She says she had a fall 2 days ago.  She missed the bottom step and fell over onto her left side.  She is it was hurting a little bit at yesterday but this morning it was much more painful.  She is not able to put weight on it.  She denies any other injuries from the fall.  She says she did not hit her head.  No neck or back pain.  She is not anticoagulants.       Home Medications Prior to Admission medications   Medication Sig Start Date End Date Taking? Authorizing Provider  albuterol (VENTOLIN HFA) 108 (90 Base) MCG/ACT inhaler INHALE 1-2 PUFFS INTO THE LUNGS EVERY 6 (SIX) HOURS AS NEEDED FOR WHEEZING OR SHORTNESS OF BREATH. Patient taking differently: Inhale 2 puffs into the lungs 4 (four) times daily as needed for wheezing or shortness of breath. 02/24/21  Yes Mosie Lukes, MD  Calcium Carbonate (CALCIUM 600 PO) Take 1 tablet by mouth in the morning and at bedtime.   Yes [provider]  carvedilol (COREG) 25 MG tablet TAKE 1 TABLET TWICE DAILY WITH A MEAL 02/01/21   Lelon Perla, MD  cetirizine (ZYRTEC) 10 MG tablet Take 10 mg by mouth daily.    [provider]  diltiazem (CARDIZEM CD) 120 MG 24 hr capsule TAKE 1 CAPSULE BY MOUTH EVERY DAY 05/04/21   Lelon Perla, MD  docusate sodium (COLACE) 250 MG capsule Take 250 mg by mouth daily as needed for constipation.    [provider]  donepezil (ARICEPT) 10 MG tablet TAKE 1 TABLET AT BEDTIME 08/01/21   Rondel Jumbo, PA-C  Ergocalciferol 50 MCG (2000 UT) TABS Take 2,000 Units by mouth daily.    [provider]  famotidine (PEPCID) 20 MG tablet TAKE 1 TABLET TWICE DAILY 10/17/21   Mosie Lukes, MD  ferrous sulfate 325 (65 FE) MG tablet Take 325 mg by mouth daily with breakfast.    [provider]  folic acid (FOLVITE) 1 MG tablet Take 1 tablet (1 mg total) by mouth daily. 01/19/20   Mosie Lukes, MD  gabapentin (NEURONTIN) 300 MG capsule Take 1 capsule (300 mg total) by mouth 2 (two) times daily. 02/14/21   Pieter Partridge, DO  hydrOXYzine (ATARAX) 25 MG tablet TAKE 0.5-1 TABLETS (12.5-25 MG TOTAL) BY MOUTH 2 (TWO) TIMES DAILY AS NEEDED. 04/15/21   Mosie Lukes, MD  Multiple Vitamin (MULTIVITAMIN WITH MINERALS) TABS tablet Take 1 tablet by mouth daily.    [provider]  omeprazole (PRILOSEC) 20 MG capsule Take 20 mg by mouth daily.    [provider]  pentosan polysulfate (ELMIRON) 100 MG capsule Take 100 mg by mouth as directed. two at morning and two at night 02/07/12   Perkins, Alexzandrew L, PA-C  rosuvastatin (CRESTOR) 20 MG tablet TAKE 1 TABLET AT BEDTIME 11/02/21   Lelon Perla, MD  telmisartan (MICARDIS) 40 MG tablet Take 1 tablet (40 mg total) by mouth daily. 12/13/20   Lelon Perla, MD  venlafaxine XR (EFFEXOR-XR) 75 MG 24 hr capsule TAKE 3 CAPSULES  EVERY DAY WITH BREAKFAST 08/31/21   Mosie Lukes, MD      Allergies    Dilaudid [hydromorphone hcl]    Review of Systems   Review of Systems  Constitutional:  Negative for chills, diaphoresis, fatigue and fever.  HENT:  Negative for congestion, rhinorrhea and sneezing.   Eyes: Negative.   Respiratory:  Negative for cough, chest tightness and shortness of breath.   Cardiovascular:  Negative for chest pain and leg swelling.  Gastrointestinal:  Negative for abdominal pain, blood in stool, diarrhea, nausea and vomiting.  Genitourinary:  Negative for difficulty urinating, flank pain, frequency and hematuria.  Musculoskeletal:  Positive for arthralgias. Negative for back pain.  Skin:  Negative for rash.  Neurological:  Negative for dizziness, speech difficulty, weakness, numbness and  headaches.    Physical Exam Updated Vital Signs BP (!) 152/70   Pulse 60   Temp 97.7 F (36.5 C) (Oral)   Resp 16   Ht '5\' 1"'$  (1.549 m)   Wt 70.3 kg   LMP 01/30/1994   SpO2 100%   BMI 29.29 kg/m  Physical Exam Constitutional:      Appearance: She is well-developed.  HENT:     Head: Normocephalic and atraumatic.  Eyes:     Pupils: Pupils are equal, round, and reactive to light.  Cardiovascular:     Rate and Rhythm: Normal rate and regular rhythm.     Heart sounds: Normal heart sounds.  Pulmonary:     Effort: Pulmonary effort is normal. No respiratory distress.     Breath sounds: Normal breath sounds. No wheezing or rales.  Chest:     Chest wall: No tenderness.  Abdominal:     General: Bowel sounds are normal.     Palpations: Abdomen is soft.     Tenderness: There is no abdominal tenderness. There is no guarding or rebound.  Musculoskeletal:        General: Normal range of motion.     Cervical back: Normal range of motion and neck supple.     Comments: Positive tenderness to the left hip and left knee.  There is some external rotation of the left leg.  No swelling noted to the leg.  Pedal pulses are intact.  No obvious deformity.  No other pain on palpation or range of motion of the other extremities.  Lymphadenopathy:     Cervical: No cervical adenopathy.  Skin:    General: Skin is warm and dry.     Findings: No rash.  Neurological:     Mental Status: She is alert and oriented to person, place, and time.     ED Results / Procedures / Treatments   Labs (all labs ordered are listed, but only abnormal results are displayed) Labs Reviewed  BASIC METABOLIC PANEL - Abnormal; Notable for the following components:      Result Value   Glucose, Bld 110 (*)    Calcium 8.4 (*)    All other components within normal limits  CBC WITH DIFFERENTIAL/PLATELET - Abnormal; Notable for the following components:   WBC 10.9 (*)    Hemoglobin 11.7 (*)    Neutro Abs 8.8 (*)    All  other components within normal limits  PROTIME-INR  TYPE AND SCREEN    EKG EKG Interpretation  Date/Time:  Monday November 21 2021 11:04:00 EDT Ventricular Rate:  59 PR Interval:  148 QRS Duration: 77 QT Interval:  426 QTC Calculation: 422 R Axis:   -25 Text Interpretation: Sinus rhythm  Inferior infarct, old since last tracing no significant change Confirmed by Malvin Johns [phone]) on 11/21/2021 11:13:19 AM  Radiology DG Hip Unilat With Pelvis 2-3 Views Left  Result Date: 11/21/2021 CLINICAL DATA:  Fall 2 days ago. Left hip pain. Unable to ambulate. EXAM: DG HIP (WITH OR WITHOUT PELVIS) LV LEFT COMPARISON:  None Available. FINDINGS: A fracture through the neck of the left femur is displaced superiorly. Humeral head is located scratched at femoral head is located. No other acute osseous abnormality is present. Degenerative changes are present in the lower lumbar spine and SI joints. IMPRESSION: Displaced fracture through the neck of the left femur. Electronically Signed   By: San Morelle M.D.   On: 11/21/2021 10:47   DG Chest 2 View  Result Date: 11/21/2021 CLINICAL DATA:  Preoperative respiratory exam for hip fracture. EXAM: CHEST - 2 VIEW COMPARISON:  03/11/2020 FINDINGS: Lordotic positioning. Heart and mediastinal shadows are normal allowing technical features. Mild scarring/volume loss at the right lung base. The chest is otherwise clear. Old augmented fracture at the thoracolumbar junction region. IMPRESSION: No active cardiopulmonary disease. Mild scarring/volume loss at the right lung base. Electronically Signed   By: Nelson Chimes M.D.   On: 11/21/2021 10:47   DG Knee Complete 4 Views Left  Result Date: 11/21/2021 CLINICAL DATA:  Fall with leg pain EXAM: LEFT KNEE - COMPLETE 3 VIEW COMPARISON:  None Available. FINDINGS: Prior total left knee arthroplasty. Hardware is intact. Evidence of fracture. Small joint effusion. Soft tissue swelling about the knee. IMPRESSION:  Prior total left knee arthroplasty without evidence of fracture. Electronically Signed   By: Yetta Glassman M.D.   On: 11/21/2021 10:46    Procedures Procedures    Medications Ordered in ED Medications  fentaNYL (SUBLIMAZE) injection 50 mcg (50 mcg Intravenous Given 11/21/21 0954)  fentaNYL (SUBLIMAZE) injection 50 mcg (50 mcg Intravenous Given 11/21/21 1142)    ED Course/ Medical Decision Making/ A&P                           Medical Decision Making Amount and/or Complexity of Data Reviewed Labs: ordered. Radiology: ordered.  Risk Prescription drug management. Decision regarding hospitalization.   Patient is a 76 year old female who presents after mechanical fall 2 days ago.  She has pain to her left hip.  She denies any other injuries.  No head injury.  No neck or back pain.  She had x-rays of her left knee and left hip.  These were interpreted by me and confirmed by the radiologist that show evidence of a left femoral neck fracture.  Chest x-ray was performed for preop reasons and there is no acute abnormalities.  Labs were performed and reviewed.  These are nonconcerning.  I discussed the patient with Dr. Lyla Glassing with orthopedics.  He will see the patient and plan to do surgical repair.  I spoke with Dr. Olevia Bowens who will admit the patient for further treatment.  Final Clinical Impression(s) / ED Diagnoses Final diagnoses:  Fall, initial encounter  Closed fracture of left hip, initial encounter [facility])    Rx / DC Orders ED Discharge Orders     None         Malvin Johns, MD 11/21/21 1230

## 2021-11-21 NOTE — Transfer of Care (Signed)
Immediate Anesthesia Transfer of Care Note  Patient: Shannon [name]  Procedure(s) Performed: TOTAL HIP ARTHROPLASTY ANTERIOR APPROACH (Left: Hip)  Patient Location: PACU  Anesthesia Type:General  Level of Consciousness: awake and patient cooperative  Airway & Oxygen Therapy: Patient Spontanous Breathing and Patient connected to face mask oxygen  Post-op Assessment: Report given to RN and Post -op Vital signs reviewed and stable  Post vital signs: Reviewed and stable  Last Vitals:  Vitals Value Taken Time  BP 125/54 11/21/21 1917  Temp    Pulse 52 11/21/21 1923  Resp 19 11/21/21 1923  SpO2 100 % 11/21/21 1923  Vitals shown include unvalidated device data.  Last Pain:  Vitals:   11/21/21 1600  TempSrc:   PainSc: 1       Patients Stated Pain Goal: 4 (50/35/46 5681)  Complications: No notable events documented.

## 2021-11-21 NOTE — Anesthesia Preprocedure Evaluation (Addendum)
Anesthesia Evaluation  Patient identified by MRN, date of birth, ID band Patient awake    Reviewed: Allergy & Precautions, NPO status , Patient's Chart, lab work & pertinent test results, reviewed documented beta blocker date and time   History of Anesthesia Complications Negative for: history of anesthetic complications  Airway Mallampati: III  TM Distance: <3 FB Neck ROM: Full    Dental  (+) Dental Advisory Given   Pulmonary asthma , sleep apnea and Continuous Positive Airway Pressure Ventilation ,    Pulmonary exam normal        Cardiovascular hypertension, Pt. on home beta blockers and Pt. on medications Normal cardiovascular exam   '21 TTE - EF 55 to 60%. Left ventricular diastolic parameters are consistent with Grade I diastolic dysfunction (impaired relaxation). No significant valvulopathy     Neuro/Psych PSYCHIATRIC DISORDERS Anxiety Depression Dementia  Very mild Alzheimer's  Craniopharyngioma   Neuromuscular disease    GI/Hepatic Neg liver ROS, hiatal hernia, GERD  Medicated and Controlled,  Endo/Other   Pre-DM   Renal/GU negative Renal ROS     Musculoskeletal  (+) Arthritis , Osteoarthritis,    Abdominal   Peds  Hematology  (+) Blood dyscrasia, anemia ,   Anesthesia Other Findings   Reproductive/Obstetrics                            Anesthesia Physical Anesthesia Plan  ASA: 3  Anesthesia Plan: General   Post-op Pain Management: Tylenol PO (pre-op)*   Induction: Intravenous  PONV Risk Score and Plan: 3 and Treatment may vary due to age or medical condition, Ondansetron and TIVA  Airway Management Planned: Oral ETT  Additional Equipment: None  Intra-op Plan:   Post-operative Plan: Extubation in OR  Informed Consent: I have reviewed the patients History and Physical, chart, labs and discussed the procedure including the risks, benefits and alternatives for the  proposed anesthesia with the patient or authorized representative who has indicated his/her understanding and acceptance.     Dental advisory given  Plan Discussed with: CRNA and Anesthesiologist  Anesthesia Plan Comments:        Anesthesia Quick Evaluation

## 2021-11-21 NOTE — Interval H&P Note (Signed)
History and Physical Interval Note:  11/21/2021 4:50 PM  Shannon [name]  has presented today for surgery, with the diagnosis of LEFT FEMORAL NECK FRACTURE.  The various methods of treatment have been discussed with the patient and family. After consideration of risks, benefits and other options for treatment, the patient has consented to  Procedure(s): TOTAL HIP ARTHROPLASTY ANTERIOR APPROACH (Left) as a surgical intervention.  The patient's history has been reviewed, patient examined, no change in status, stable for surgery.  I have reviewed the patient's chart and labs.  Questions were answered to the patient's satisfaction.    The risks, benefits, and alternatives were discussed with the patient. There are risks associated with the surgery including, but not limited to, problems with anesthesia (death), infection, instability (giving out of the joint), dislocation, differences in leg length/angulation/rotation, fracture of bones, loosening or failure of implants, hematoma (blood accumulation) which may require surgical drainage, blood clots, pulmonary embolism, nerve injury (foot drop and lateral thigh numbness), and blood vessel injury. The patient understands these risks and elects to proceed.   Hilton Cork Keili Hasten

## 2021-11-22 ENCOUNTER — Encounter (HOSPITAL_COMMUNITY): Payer: Self-pay | Admitting: Orthopedic Surgery

## 2021-11-22 DIAGNOSIS — G309 Alzheimer's disease, unspecified: Secondary | ICD-10-CM | POA: Diagnosis not present

## 2021-11-22 DIAGNOSIS — W19XXXA Unspecified fall, initial encounter: Secondary | ICD-10-CM | POA: Diagnosis not present

## 2021-11-22 DIAGNOSIS — F0280 Dementia in other diseases classified elsewhere without behavioral disturbance: Secondary | ICD-10-CM

## 2021-11-22 DIAGNOSIS — I5032 Chronic diastolic (congestive) heart failure: Secondary | ICD-10-CM | POA: Diagnosis not present

## 2021-11-22 DIAGNOSIS — K219 Gastro-esophageal reflux disease without esophagitis: Secondary | ICD-10-CM

## 2021-11-22 DIAGNOSIS — I10 Essential (primary) hypertension: Secondary | ICD-10-CM

## 2021-11-22 DIAGNOSIS — F329 Major depressive disorder, single episode, unspecified: Secondary | ICD-10-CM

## 2021-11-22 DIAGNOSIS — Y92009 Unspecified place in unspecified non-institutional (private) residence as the place of occurrence of the external cause: Secondary | ICD-10-CM

## 2021-11-22 DIAGNOSIS — S72002A Fracture of unspecified part of neck of left femur, initial encounter for closed fracture: Secondary | ICD-10-CM | POA: Diagnosis not present

## 2021-11-22 DIAGNOSIS — E782 Mixed hyperlipidemia: Secondary | ICD-10-CM

## 2021-11-22 LAB — CBC
HCT: 28.4 % — ABNORMAL LOW (ref 36.0–46.0)
Hemoglobin: 9 g/dL — ABNORMAL LOW (ref 12.0–15.0)
MCH: 29.4 pg (ref 26.0–34.0)
MCHC: 31.7 g/dL (ref 30.0–36.0)
MCV: 92.8 fL (ref 80.0–100.0)
Platelets: 193 10*3/uL (ref 150–400)
RBC: 3.06 MIL/uL — ABNORMAL LOW (ref 3.87–5.11)
RDW: 13.5 % (ref 11.5–15.5)
WBC: 13.6 10*3/uL — ABNORMAL HIGH (ref 4.0–10.5)
nRBC: 0 % (ref 0.0–0.2)

## 2021-11-22 LAB — COMPREHENSIVE METABOLIC PANEL
ALT: 26 U/L (ref 0–44)
AST: 39 U/L (ref 15–41)
Albumin: 3.2 g/dL — ABNORMAL LOW (ref 3.5–5.0)
Alkaline Phosphatase: 65 U/L (ref 38–126)
Anion gap: 6 (ref 5–15)
BUN: 18 mg/dL (ref 8–23)
CO2: 26 mmol/L (ref 22–32)
Calcium: 8.3 mg/dL — ABNORMAL LOW (ref 8.9–10.3)
Chloride: 108 mmol/L (ref 98–111)
Creatinine, Ser: 0.78 mg/dL (ref 0.44–1.00)
GFR, Estimated: >60 mL/min (ref >60)
Glucose, Bld: 146 mg/dL — ABNORMAL HIGH (ref 70–99)
Potassium: 4.4 mmol/L (ref 3.5–5.1)
Sodium: 140 mmol/L (ref 135–145)
Total Bilirubin: 0.6 mg/dL (ref 0.3–1.2)
Total Protein: 5.5 g/dL — ABNORMAL LOW (ref 6.5–8.1)

## 2021-11-22 MED ORDER — ASPIRIN 81 MG PO CHEW: 81.0000 mg | CHEWABLE_TABLET | Freq: Two times a day (BID) | ORAL | 0 refills | Status: DC

## 2021-11-22 MED ORDER — HYDROCODONE-ACETAMINOPHEN 10-325 MG PO TABS: 0.5000 | ORAL_TABLET | ORAL | 0 refills | Status: DC | PRN

## 2021-11-22 MED ORDER — ENSURE ENLIVE PO LIQD
237.0000 mL | Freq: Two times a day (BID) | ORAL | Status: DC
  Administered 2021-11-23 – 2021-11-25 (×3): 237 mL via ORAL

## 2021-11-22 NOTE — Anesthesia Postprocedure Evaluation (Signed)
Anesthesia Post Note  Patient: Shannon [name]  Procedure(s) Performed: TOTAL HIP ARTHROPLASTY ANTERIOR APPROACH (Left: Hip)     Patient location during evaluation: PACU Anesthesia Type: General Level of consciousness: awake and alert Pain management: pain level controlled Vital Signs Assessment: post-procedure vital signs reviewed and stable Respiratory status: spontaneous breathing, nonlabored ventilation, respiratory function stable and patient connected to nasal cannula oxygen Cardiovascular status: blood pressure returned to baseline and stable Postop Assessment: no apparent nausea or vomiting Anesthetic complications: no   No notable events documented.  Last Vitals:  Vitals:   11/22/21 1312 11/22/21 1736  BP: (!) 103/49 (!) 116/49  Pulse: 66 62  Resp: 18 18  Temp: (!) 36.4 C 36.6 C  SpO2: 100% 100%    Last Pain:  Vitals:   11/22/21 1312  TempSrc: Oral  PainSc:                  [name]

## 2021-11-22 NOTE — Evaluation (Signed)
Physical Therapy Evaluation Patient Details Name: Shannon [name] MRN: [id number] DOB: Oct 11, 1945 Today's Date: 11/22/2021  History of Present Illness  76 y.o. female with medical history significant of abnormal nuclear stress test, history of chest pain, palpitations, asthma as a child, dyspnea on exertion, right breast cancer, left carpal tunnel syndrome, bilateral cataracts, closed fracture of maxilla, dilated cardiomyopathy with last echocardiogram showing an EF of 55 to 60% and grade 1 diastolic dysfunction, craniopharyngioma, unspecified dermatitis, essential hypertension failed total left knee replacement, GERD, herpes zoster, measles, melanoma, mumps, hyperlipidemia, hyponatremia, interstitial cystitis, left-sided back pain, depression, neuropathy, GERD, hiatal hernia, mild Alzheimer's disease, prediabetes, recurrent falls, right knee pain, history of UTI, osteoporotic vertebral fractures, vitamin D deficiency who had a fall from the stairs at home 10.21.23. Dx of L femoral neck fx, s/p L AA-THA 11/21/21.  Clinical Impression  Pt admitted with above diagnosis. Pt ambulated 65' with RW, distance limited by fatigue and pain. Initiated THA HEP. ST-SNF recommended.  Pt currently with functional limitations due to the deficits listed below (see PT Problem List). Pt will benefit from skilled PT to increase their independence and safety with mobility to allow discharge to the venue listed below.          Recommendations for follow up therapy are one component of a multi-disciplinary discharge planning process, led by the attending physician.  Recommendations may be updated based on patient status, additional functional criteria and insurance authorization.  Follow Up Recommendations Skilled nursing-short term rehab (<3 hours/day) Can patient physically be transported by private vehicle: Yes    Assistance Recommended at Discharge Intermittent Supervision/Assistance  Patient can return home with the  following  A little help with walking and/or transfers;A little help with bathing/dressing/bathroom;Assistance with cooking/housework;Assist for transportation;Help with stairs or ramp for entrance;Direct supervision/assist for medications management    Equipment Recommendations None recommended by PT  Recommendations for Other Services       Functional Status Assessment Patient has had a recent decline in their functional status and demonstrates the ability to make significant improvements in function in a reasonable and predictable amount of time.     Precautions / Restrictions Precautions Precautions: None;Fall Precaution Comments: fell PTA, family reports pt has had other falls but not able to provide details Restrictions Weight Bearing Restrictions: No      Mobility  Bed Mobility Overal bed mobility: Needs Assistance Bed Mobility: Supine to Sit     Supine to sit: Min assist     General bed mobility comments: assistance with lifting LLE to EOB.    Transfers Overall transfer level: Needs assistance Equipment used: Rolling walker (2 wheels) Transfers: Sit to/from Stand Sit to Stand: Min assist           General transfer comment: cueing for walker managment and hand placement, min A to power up    Ambulation/Gait Ambulation/Gait assistance: Min guard Gait Distance (Feet): 60 Feet Assistive device: Rolling walker (2 wheels) Gait Pattern/deviations: Step-to pattern, Decreased step length - right, Decreased step length - left Gait velocity: decr     General Gait Details: VCs sequencing and positioning in RW, distance limited by pain and fatigue  Stairs            Wheelchair Mobility    Modified Rankin (Stroke Patients Only)       Balance Overall balance assessment: Needs assistance Sitting-balance support: No upper extremity supported, Feet supported Sitting balance-Leahy Scale: Fair     Standing balance support: Reliant on assistive device for  balance, During functional activity, Bilateral upper extremity supported Standing balance-Leahy Scale: Poor Standing balance comment: reliant on RW for balance.                             Pertinent Vitals/Pain Pain Assessment Faces Pain Scale: Hurts even more Pain Location: incision site, L hip with activity Pain Descriptors / Indicators: Discomfort, Grimacing Pain Intervention(s): Limited activity within patient's tolerance, Monitored during session, Premedicated before session, Ice applied    Home Living Family/patient expects to be discharged to:: Private residence Living Arrangements: Spouse/significant other Available Help at Discharge: Available PRN/intermittently Type of Home: House Home Access: Stairs to enter Entrance Stairs-Rails: Right Entrance Stairs-Number of Steps: 4 steps from garage   Home Layout: Able to live on main level with bedroom/bathroom;Multi-level Home Equipment: Shower seat;Rolling Walker (2 wheels);Grab bars - tub/shower;Grab bars - toilet;Toilet riser;Cane - single point      Prior Function Prior Level of Function : Independent/Modified Independent             Mobility Comments: family reports pt has had some falls in past 6 months; walked without assistive device ADLs Comments: Mod I for ADLs, patient also drove. Patient Husband is unable to physically assist with ADLs.     Hand Dominance   Dominant Hand: Right    Extremity/Trunk Assessment   Upper Extremity Assessment Upper Extremity Assessment: Defer to OT evaluation    Lower Extremity Assessment Lower Extremity Assessment: LLE deficits/detail LLE Deficits / Details: hip AAROM ~30* flexion, ~10 ABDuction limited by pain LLE: Unable to fully assess due to pain    Cervical / Trunk Assessment Cervical / Trunk Assessment: Other exceptions;Kyphotic Cervical / Trunk Exceptions: scoliosis  Communication   Communication: No difficulties  Cognition Arousal/Alertness:  Awake/alert Behavior During Therapy: WFL for tasks assessed/performed Overall Cognitive Status: Within Functional Limits for tasks assessed                                          General Comments      Exercises Total Joint Exercises Ankle Circles/Pumps: AROM, 20 reps, Both, Supine Heel Slides: AAROM, Left, 10 reps, Supine Hip ABduction/ADduction: AAROM, Left, 10 reps, Supine   Assessment/Plan    PT Assessment Patient needs continued PT services  PT Problem List Decreased strength;Decreased range of motion;Decreased mobility;Decreased activity tolerance;Decreased balance;Pain       PT Treatment Interventions Gait training;Therapeutic exercise;Therapeutic activities;DME instruction;Functional mobility training;Patient/family education    PT Goals (Current goals can be found in the Care Plan section)  Acute Rehab PT Goals Patient Stated Goal: to go home PT Goal Formulation: With patient/family Time For Goal Achievement: 11/29/21 Potential to Achieve Goals: Good    Frequency Min 3X/week     Co-evaluation               AM-PAC PT "6 Clicks" Mobility  Outcome Measure Help needed turning from your back to your side while in a flat bed without using bedrails?: A Little Help needed moving from lying on your back to sitting on the side of a flat bed without using bedrails?: A Little Help needed moving to and from a bed to a chair (including a wheelchair)?: A Little Help needed standing up from a chair using your arms (e.g., wheelchair or bedside chair)?: A Little Help needed to walk in hospital room?: A Little Help  needed climbing 3-5 steps with a railing? : A Lot 6 Click Score: 17    End of Session Equipment Utilized During Treatment: Gait belt Activity Tolerance: Patient tolerated treatment well Patient left: in chair;with chair alarm set;with call bell/phone within reach;with family/visitor present Nurse Communication: Mobility status PT Visit  Diagnosis: Difficulty in walking, not elsewhere classified (R26.2);Pain Pain - Right/Left: Left Pain - part of body: Hip    Time: 3414-4360 PT Time Calculation (min) (ACUTE ONLY): 23 min   Charges:   PT Evaluation $PT Eval Moderate Complexity: 1 Mod PT Treatments $Gait Training: 8-22 mins        Philomena Doheny PT 11/22/2021  Acute Rehabilitation Services  Office [phone]

## 2021-11-22 NOTE — Hospital Course (Signed)
76 year old female with past medical history of hypertension, gastroesophageal reflux disease, nonischemic cardiomyopathy (clean cath 2178), diastolic congestive heart failure (2015 Cath EF 50-55%), major depressive disorder, right breast cancer (Dx 2011 S/P Chemo/Radiation/Surgery), mild Alzheimer's dementia (Neuro note 06/2021) and hyperlipidemia who presented to [facility] emergency department via EMS after experiencing a fall on Saturday and ongoing left hip pain.  Upon evaluation in the emergency department patient was identified to have a left femoral neck fracture.  EDP discussed case with Dr. Lyla Glassing with orthopedic surgery who took the patient for left hip arthroplasty the evening of 10/23.    On physical therapy evaluation they are recommending skilled nursing facility placement.  Patient is now awaiting bed availability.

## 2021-11-22 NOTE — Progress Notes (Signed)
PT Cancellation Note  Patient Details Name: Shannon [name] MRN: [id number] DOB: [birth year]   Cancelled Treatment:    Reason Eval/Treat Not Completed: Medical issues which prohibited therapy (RN requested PT hold right now due to pt having a headache and low BP. Will follow.)   Philomena Doheny PT 11/22/2021  Acute Rehabilitation Services  Office [phone]

## 2021-11-22 NOTE — Progress Notes (Signed)
    Subjective:  Patient reports pain as mild to moderate.  Denies N/V/CP/SOB/Abd pain. She states she is having some soreness in her thigh this morning. Denies tingling and numbness in LE bilaterally.   Objective:   VITALS:   Vitals:   11/21/21 2100 11/21/21 2154 11/22/21 0030 11/22/21 0447  BP: 138/75 114/66 99/62 (!) 101/54  Pulse: (!) 55 (!) 59 71 66  Resp: $Remo'19 14 14 18  'PuqcV$ Temp: 97.8 F (36.6 C) 97.9 F (36.6 C) 98.9 F (37.2 C) 98.5 F (36.9 C)  TempSrc:  Oral Oral Oral  SpO2: 100% 99% 90% 98%  Weight: 77.9 kg     Height: $Remove'5\' 1"'rWpxYMq$  (1.549 m)       Patient is lying comfortably in bed. NAD.  Neurologically intact ABD soft Neurovascular intact Sensation intact distally Intact pulses distally Dorsiflexion/Plantar flexion intact Incision: dressing C/D/I No cellulitis present Compartment soft Ice on her hip this morning.   Lab Results  Component Value Date   WBC 13.6 (H) 11/22/2021   HGB 9.0 (L) 11/22/2021   HCT 28.4 (L) 11/22/2021   MCV 92.8 11/22/2021   PLT 193 11/22/2021   BMET    Component Value Date/Time   NA 140 11/22/2021 0337   NA 144 08/07/2016 1029   K 4.4 11/22/2021 0337   K 3.9 08/07/2016 1029   CL 108 11/22/2021 0337   CL 107 04/08/2012 1018   CO2 26 11/22/2021 0337   CO2 30 (H) 08/07/2016 1029   GLUCOSE 146 (H) 11/22/2021 0337   GLUCOSE 105 08/07/2016 1029   GLUCOSE 100 (H) 04/08/2012 1018   BUN 18 11/22/2021 0337   BUN 10.3 08/07/2016 1029   CREATININE 0.78 11/22/2021 0337   CREATININE 0.84 07/02/2018 1534   CREATININE 0.8 08/07/2016 1029   CALCIUM 8.3 (L) 11/22/2021 0337   CALCIUM 9.4 08/07/2016 1029   EGFR 72 (L) 08/07/2016 1029   GFRNONAA >60 11/22/2021 8937     Assessment/Plan: 1 Day Post-Op   Principal Problem:   Closed left hip fracture, initial encounter ([place]) Active Problems:   Hyperlipidemia, mixed   Essential hypertension   GERD (gastroesophageal reflux disease)   Osteoporosis   Prediabetes   Dermatitis    Palpitation   Sleep apnea   Depression with anxiety   Dementia (HCC)   Grade I diastolic dysfunction   Fall at home, initial encounter  ABLA. Hemoglobin 9.0, asymptomatic. Continue to monitor.   WBAT with walker DVT ppx: Aspirin, SCDs, TEDS PO pain control PT/OT: PT has not seen yet. PT to come by today.  Dispo: Patient is under care of the medical team, disposition per their recommendation. Pain medication and DVT ppx printed in chart.     Charlott Rakes, PA-C 11/22/2021, 7:43 AM  [facility]  [place] [address], [address], [place], [place] [address] Phone: [phone] [email or web address] Facebook  [employer]

## 2021-11-22 NOTE — Progress Notes (Signed)
PROGRESS NOTE   Shannon [name]  VPX:[id number] DOB: Oct 14, 1945 DOA: 11/21/2021 PCP: Mosie Lukes, MD   Date of Service: the patient was seen and examined on 11/23/2021  Brief Narrative:  76 year old female with past medical history of hypertension, gastroesophageal reflux disease, nonischemic cardiomyopathy (clean cath 4627), diastolic congestive heart failure (2015 Cath EF 50-55%), major depressive disorder, right breast cancer (Dx 2011 S/P Chemo/Radiation/Surgery), mild Alzheimer's dementia (Neuro note 06/2021) and hyperlipidemia who presented to [facility] emergency department via EMS after experiencing a fall on Saturday and ongoing left hip pain.  Upon evaluation in the emergency department patient was identified to have a left femoral neck fracture.  EDP discussed case with Dr. Lyla Glassing with orthopedic surgery who took the patient for left hip arthroplasty the evening of 10/23.    On physical therapy evaluation they are recommending skilled nursing facility placement.  Patient is now awaiting bed availability.  Assessment and Plan: * Closed left hip fracture, initial encounter [facility]) Patient underwent operative mention on 03/50 without complication.  With Dr. Lyla Glassing PT evaluation now complete, skilled nursing facility placement recommended. Weightbearing as tolerated with walker As needed opiate-based analgesics for associated pain  Fall at home, initial encounter Mechanical fall at home, unlikely to be caused by a medical condition Resulting in left hip fracture, assessment and plan as above   Chronic diastolic CHF (congestive heart failure) (HCC) No clinical evidence of cardiogenic volume overload Strict input and output monitoring Daily weights Low-sodium diet   Essential hypertension Resume patients home regimen of oral antihypertensives with Coreg and diltiazem Titrate antihypertensive regimen as necessary to achieve adequate BP control PRN intravenous  antihypertensives for excessively elevated blood pressure    Alzheimer's dementia without behavioral disturbance (HCC) Longstanding known history of dementia and cognitive deficit Continue home regimen of Aricept Minimizing uncomfortable stimuli Minimizing mood altering and sedating agents Encouraging family to remain at bedside is much as possible Frequent redirection by staff Fall precautions   Mixed hyperlipidemia Continuing home regimen of lipid lowering therapy.   GERD without esophagitis Continuing home regimen of daily PPI therapy.   Major depressive disorder Continue psychotropic medication regimen       Subjective:  Patient complains of mild left hip pain, worse with movement of the affected extremity radiating distally.  Physical Exam:  Vitals:   11/22/21 1006 11/22/21 1312 11/22/21 1736 11/22/21 2037  BP: (!) 92/58 (!) 103/49 (!) 116/49 (!) 143/87  Pulse: 78 66 62 64  Resp: '18 18 18 16  '$ Temp: 98.7 F (37.1 C) (!) 97.5 F (36.4 C) 97.8 F (36.6 C) 97.9 F (36.6 C)  TempSrc:  Oral  Oral  SpO2: (!) 88% 100% 100% 100%  Weight:      Height:         Constitutional: Awake alert and oriented x3, no associated distress.   Skin: no rashes, no lesions, good skin turgor noted. Eyes: Pupils are equally reactive to light.  No evidence of scleral icterus or conjunctival pallor.  ENMT: Moist mucous membranes noted.  Posterior pharynx clear of any exudate or lesions.   Respiratory: clear to auscultation bilaterally, no wheezing, no crackles. Normal respiratory effort. No accessory muscle use.  Cardiovascular: Regular rate and rhythm, no murmurs / rubs / gallops. No extremity edema. 2+ pedal pulses. No carotid bruits.  Abdomen: Abdomen is soft and nontender.  No evidence of intra-abdominal masses.  Positive bowel sounds noted in all quadrants.   Musculoskeletal: Pain with passive and active range of motion  of the left hip.  Data Reviewed:  I have personally  reviewed and interpreted labs, imaging.  Significant findings are   CBC: Recent Labs  Lab 11/21/21 1135 11/22/21 0337  WBC 10.9* 13.6*  NEUTROABS 8.8*  --   HGB 11.7* 9.0*  HCT 36.7 28.4*  MCV 92.7 92.8  PLT 194 194   Basic Metabolic Panel: Recent Labs  Lab 11/21/21 1135 11/22/21 0337  NA 140 140  K 3.8 4.4  CL 106 108  CO2 28 26  GLUCOSE 110* 146*  BUN 17 18  CREATININE 0.79 0.78  CALCIUM 8.4* 8.3*   GFR: Estimated Creatinine Clearance: 55.6 mL/min (by C-G formula based on SCr of 0.78 mg/dL). Liver Function Tests: Recent Labs  Lab 11/22/21 0337  AST 39  ALT 26  ALKPHOS 65  BILITOT 0.6  PROT 5.5*  ALBUMIN 3.2*    Coagulation Profile: Recent Labs  Lab 11/21/21 1135  INR 1.0     EKG/Telemetry: Personally reviewed.  Rhythm is normal sinus rhythm with heart rate of 95 bpm.  No dynamic ST segment changes appreciated.   Code Status:  Full code.   Family Communication: Daughter is at the bedside and has been updated on plan of care   Severity of Illness:  The appropriate patient status for this patient is INPATIENT. Inpatient status is judged to be reasonable and necessary in order to provide the required intensity of service to ensure the patient's safety. The patient's presenting symptoms, physical exam findings, and initial radiographic and laboratory data in the context of their chronic comorbidities is felt to place them at high risk for further clinical deterioration. Furthermore, it is not anticipated that the patient will be medically stable for discharge from the hospital within 2 midnights of admission.   * I certify that at the point of admission it is my clinical judgment that the patient will require inpatient hospital care spanning beyond 2 midnights from the point of admission due to high intensity of service, high risk for further deterioration and high frequency of surveillance required.*  Time spent:  40 minutes  Author:  Vernelle Emerald MD  11/23/2021 12:21 AM

## 2021-11-22 NOTE — Progress Notes (Signed)
Initial Nutrition Assessment  DOCUMENTATION CODES:   Obesity unspecified  INTERVENTION:  -Continue heart healthy diet as tolerated.  -Provide Ensure Plus HP BID to provide 350kcal and 20g protein/bottle. -Encourage balanced nutrition for healing  NUTRITION DIAGNOSIS:  Increased nutrient needs related to post-op healing as evidenced by other (comment) (increased metabolic need).  GOAL:  Patient will meet greater than or equal to 90% of their needs  MONITOR:  PO intake, Supplement acceptance  REASON FOR ASSESSMENT:  Consult Hip fracture protocol  ASSESSMENT:  Pt is a 76yo F with PMH of NICM, GERD, interstitial cystitis, anemia, asthma, congestive dilated cardiomyopathy, HTN, HLD, prediabetes, failed total left knee replacement, vitamin D deficiency and osteoporosis who presents with hip fracture.   Pt is POD 1 s/p L THA. Diet advanced to heart healthy following surgery. Pt report poor po intake due to dizziness. Offered ONS and pt tried and enjoyed Ensure Plus HP. She is agreeable to ONS BID (350kcal, 20g protein). Pt reports she often skips meals at home. Notes having intentional weight loss PTA. Weight history over the last year reviewed and weight maintaining between 76-78kg. Mild fat loss on triceps and mild muscle wasting at temple. Pt does not meet ASPEN criteria for malnutrition at this time.  Discussed the importance of adequate nutrition for healing. Provided coupons of Ensure Plus HP for home use. Recommend instead of skipping a meal, drinking one of the ONS. Pt agreeable. Encouraged balanced nutrition to ensure adequate micronutrients. Pt takes Vitamin D at home.  Medications reviewed and include: aspirin, vitamin D3, colace, pepcid, folic acid, zofran, protonix, crestor  Labs reviewed: BG:146, vitamin D:45.18, B12:918, A1c:5.6   NUTRITION - FOCUSED PHYSICAL EXAM:  Flowsheet Row Most Recent Value  Orbital Region No depletion  Upper Arm Region Mild depletion  Thoracic  and Lumbar Region No depletion  Buccal Region No depletion  Temple Region Mild depletion  Clavicle Bone Region No depletion  Clavicle and Acromion Bone Region No depletion  Scapular Bone Region Unable to assess  Dorsal Hand Mild depletion  Patellar Region No depletion  Anterior Thigh Region No depletion  Posterior Calf Region No depletion  Hair Reviewed  Eyes Reviewed  Mouth Reviewed  Skin Reviewed  Nails Reviewed       Diet Order:   Diet Order             Diet Heart Room service appropriate? Yes; Fluid consistency: Thin  Diet effective now                   EDUCATION NEEDS:   Education needs have been addressed  Skin:  Skin Assessment: Skin Integrity Issues: Skin Integrity Issues:: Incisions Incisions: breast and hip  Last BM:  10/22  Height:  Ht Readings from Last 1 Encounters:  11/21/21 '5\' 1"'$  (1.549 m)    Weight:  Wt Readings from Last 1 Encounters:  11/21/21 77.9 kg    BMI:  Body mass index is 32.45 kg/m.  Estimated Nutritional Needs:  Kcal:  1950-2330kcal  Protein:  70-95g  Fluid:  1997m  KCandise Bowens MS, RD, LDN, CNSC See AMiON for contact information

## 2021-11-22 NOTE — Evaluation (Signed)
Occupational Therapy Evaluation Patient Details Name: Shannon [name] MRN: [id number] DOB: [birth year] Today's Date: 11/22/2021   History of Present Illness 76 y.o. female with medical history significant of abnormal nuclear stress test, history of chest pain, palpitations, asthma as a child, dyspnea on exertion, right breast cancer, left carpal tunnel syndrome, bilateral cataracts, closed fracture of maxilla, dilated cardiomyopathy with last echocardiogram showing an EF of 55 to 60% and grade 1 diastolic dysfunction, craniopharyngioma, unspecified dermatitis, essential hypertension failed total left knee replacement, GERD, herpes zoster, measles, melanoma, mumps, hyperlipidemia, hyponatremia, interstitial cystitis, left-sided back pain, depression, neuropathy, GERD, hiatal hernia, mild Alzheimer's disease, prediabetes, recurrent falls, right knee pain, history of UTI, osteoporotic vertebral fractures, vitamin D deficiency who had a fall from the stairs at home 10.21.23. Dx of L femoral neck fx, s/p L AA-THA 11/21/21.   Clinical Impression   Shannon [name] is a 76 y/o female with above medical history. Prior to hospitalization patient independent for ADLs and able to drive within the community. Patient now presents with pain in L hip, decreased activity tolerance, and decreased balance. Patient min A for supine to sit- requiring assistance for lifting left leg, min a for sit to stand with walker. While ambulating to bathroom with walker patient exhibited abduction of left leg from body, while shifting most body weight to right leg, after two losses of balance with mod assist patient was transitioned to the chair. While sitting patient stated "I feel dizzy" and BP was monitored. BP 89/45mhg in sitting, nurse called into room. Patient then needed to use BSC, requiring min A for toilet transfer and min guard for toileting.Patient then transferred to the recliner. BP was 93/518mg at end of session with feet  elevated in recliner. Paient max A for LB bathing/dressing tasks.  Patient requires 24/7 physical  assistance at home. Patient will benefit from skilled OT services while in hospital to improve deficits and learn compensatory strategies as needed in order to return to PLOF. Recommend SNF.     Recommendations for follow up therapy are one component of a multi-disciplinary discharge planning process, led by the attending physician.  Recommendations may be updated based on patient status, additional functional criteria and insurance authorization.   Follow Up Recommendations  Skilled nursing-short term rehab (<3 hours/day)    Assistance Recommended at Discharge Frequent or constant Supervision/Assistance  Patient can return home with the following A lot of help with walking and/or transfers;A lot of help with bathing/dressing/bathroom;Assist for transportation;Direct supervision/assist for financial management;Assistance with cooking/housework;Help with stairs or ramp for entrance;Direct supervision/assist for medications management    Functional Status Assessment  Patient has had a recent decline in their functional status and demonstrates the ability to make significant improvements in function in a reasonable and predictable amount of time.  Equipment Recommendations  None recommended by OT    Recommendations for Other Services       Precautions / Restrictions Precautions Precautions: None Restrictions Weight Bearing Restrictions: No      Mobility Bed Mobility Overal bed mobility: Needs Assistance Bed Mobility: Supine to Sit     Supine to sit: Min assist     General bed mobility comments: assistance with lifting LLE to EOB.    Transfers Overall transfer level: Needs assistance Equipment used: Rolling walker (2 wheels) Transfers: Sit to/from Stand, Bed to chair/wheelchair/BSC Sit to Stand: Min assist Stand pivot transfers: Min assist         General transfer comment:  cueing for walker managment  Balance Overall balance assessment: Needs assistance Sitting-balance support: No upper extremity supported, Feet supported Sitting balance-Leahy Scale: Fair     Standing balance support: Reliant on assistive device for balance, During functional activity, Bilateral upper extremity supported Standing balance-Leahy Scale: Poor Standing balance comment: reliant on RW for balance.                           ADL either performed or assessed with clinical judgement   ADL Overall ADL's : Needs assistance/impaired     Grooming: Set up;Sitting   Upper Body Bathing: Set up;Sitting   Lower Body Bathing: Maximal assistance;Sitting/lateral leans   Upper Body Dressing : Set up;Sitting   Lower Body Dressing: Maximal assistance;Sit to/from stand   Toilet Transfer: Minimal assistance;Rolling walker (2 wheels) Toilet Transfer Details (indicate cue type and reason): requires cueing for walker managment, and hand placement Toileting- Clothing Manipulation and Hygiene: Minimal assistance;+2 for safety/equipment       Functional mobility during ADLs: Moderate assistance General ADL Comments: ambulation to [facility]     Vision Baseline Vision/History: 1 Wears glasses Vision Assessment?: No apparent visual deficits     Perception     Praxis      Pertinent Vitals/Pain Pain Assessment Pain Assessment: Faces Faces Pain Scale: Hurts little more Pain Location: incision site, L hip Pain Descriptors / Indicators: Discomfort, Grimacing Pain Intervention(s): Ice applied, Limited activity within patient's tolerance, Monitored during session     Hand Dominance Right   Extremity/Trunk Assessment Upper Extremity Assessment Upper Extremity Assessment: Generalized weakness       Cervical / Trunk Assessment Cervical / Trunk Assessment: Normal   Communication Communication Communication: No difficulties   Cognition Arousal/Alertness:  Awake/alert Behavior During Therapy: WFL for tasks assessed/performed Overall Cognitive Status: Within Functional Limits for tasks assessed                                       General Comments       Exercises     Shoulder Instructions      Home Living Family/patient expects to be discharged to:: Private residence Living Arrangements: Spouse/significant other Available Help at Discharge: Available PRN/intermittently Type of Home: House Home Access: Stairs to enter [employer] of Steps: 4 steps from garage Entrance Stairs-Rails: Right Home Layout: Able to live on main level with bedroom/bathroom;Multi-level     Bathroom Shower/Tub: Occupational psychologist: Standard     Home Equipment: Advice worker (2 wheels);Grab bars - tub/shower;Grab bars - toilet;Toilet riser;Cane - single point          Prior Functioning/Environment Prior Level of Function : Independent/Modified Independent               ADLs Comments: Mod I for ADLs, patient also drove. Patient Husband is unable to physically assist with ADLs.        OT Problem List: Decreased strength;Decreased knowledge of use of DME or AE;Decreased range of motion;Decreased coordination;Decreased activity tolerance;Pain;Impaired balance (sitting and/or standing)      OT Treatment/Interventions: Self-care/ADL training;Therapeutic exercise;Modalities;Patient/family education;Balance training;Energy conservation;Therapeutic activities;Cognitive remediation/compensation;DME and/or AE instruction    OT Goals(Current goals can be found in the care plan section) Acute Rehab OT Goals Patient Stated Goal: To go home OT Goal Formulation: With patient Time For Goal Achievement: 12/06/21 Potential to Achieve Goals: Good ADL Goals Pt Will Perform Lower Body Dressing:  with supervision Pt Will Transfer to Toilet: with supervision  OT Frequency: Min 2X/week    Co-evaluation               AM-PAC OT "6 Clicks" Daily Activity     Outcome Measure Help from another person eating meals?: A Little Help from another person taking care of personal grooming?: A Little Help from another person toileting, which includes using toliet, bedpan, or urinal?: A Little Help from another person bathing (including washing, rinsing, drying)?: A Lot Help from another person to put on and taking off regular upper body clothing?: A Little Help from another person to put on and taking off regular lower body clothing?: A Lot 6 Click Score: 16   End of Session Equipment Utilized During Treatment: Gait belt;Rolling walker (2 wheels) Nurse Communication: Mobility status (nurse was notified during session of low BP. nurse and therapist collaborated on proper chair positioning after session.)  Activity Tolerance: Treatment limited secondary to medical complications (Comment) (BP was low, patient c/o feeling dizzy) Patient left: in chair;with call bell/phone within reach;with chair alarm set  OT Visit Diagnosis: Unsteadiness on feet (R26.81);Other abnormalities of gait and mobility (R26.89);Muscle weakness (generalized) (M62.81);History of falling (Z91.81);Pain Pain - Right/Left: Left Pain - part of body: Hip                Time: 7096-2836 OT Time Calculation (min): 35 min Charges:  OT General Charges $OT Visit: 1 Visit OT Evaluation $OT Eval Moderate Complexity: 1 Mod OT Treatments $Self Care/Home Management : 8-22 mins  Charlann Lange, OTS Acute rehab services   Charlann Lange 11/22/2021, 3:24 PM

## 2021-11-23 ENCOUNTER — Other Ambulatory Visit: Payer: Self-pay | Admitting: Family Medicine

## 2021-11-23 DIAGNOSIS — I5032 Chronic diastolic (congestive) heart failure: Secondary | ICD-10-CM | POA: Diagnosis not present

## 2021-11-23 DIAGNOSIS — D62 Acute posthemorrhagic anemia: Secondary | ICD-10-CM

## 2021-11-23 DIAGNOSIS — W19XXXA Unspecified fall, initial encounter: Secondary | ICD-10-CM | POA: Diagnosis not present

## 2021-11-23 DIAGNOSIS — G309 Alzheimer's disease, unspecified: Secondary | ICD-10-CM | POA: Diagnosis not present

## 2021-11-23 DIAGNOSIS — S72002A Fracture of unspecified part of neck of left femur, initial encounter for closed fracture: Secondary | ICD-10-CM | POA: Diagnosis not present

## 2021-11-23 HISTORY — DX: Chronic diastolic (congestive) heart failure: I50.32

## 2021-11-23 LAB — COMPREHENSIVE METABOLIC PANEL
ALT: 21 U/L (ref 0–44)
AST: 72 U/L — ABNORMAL HIGH (ref 15–41)
Albumin: 3.3 g/dL — ABNORMAL LOW (ref 3.5–5.0)
Alkaline Phosphatase: 66 U/L (ref 38–126)
Anion gap: 5 (ref 5–15)
BUN: 21 mg/dL (ref 8–23)
CO2: 26 mmol/L (ref 22–32)
Calcium: 8.4 mg/dL — ABNORMAL LOW (ref 8.9–10.3)
Chloride: 111 mmol/L (ref 98–111)
Creatinine, Ser: 0.88 mg/dL (ref 0.44–1.00)
GFR, Estimated: >60 mL/min (ref >60)
Glucose, Bld: 111 mg/dL — ABNORMAL HIGH (ref 70–99)
Potassium: 4.1 mmol/L (ref 3.5–5.1)
Sodium: 142 mmol/L (ref 135–145)
Total Bilirubin: 0.5 mg/dL (ref 0.3–1.2)
Total Protein: 5.7 g/dL — ABNORMAL LOW (ref 6.5–8.1)

## 2021-11-23 LAB — CBC WITH DIFFERENTIAL/PLATELET
Abs Immature Granulocytes: 0.08 10*3/uL — ABNORMAL HIGH (ref 0.00–0.07)
Basophils Absolute: 0.1 10*3/uL (ref 0.0–0.1)
Basophils Relative: 1 %
Eosinophils Absolute: 0.2 10*3/uL (ref 0.0–0.5)
Eosinophils Relative: 2 %
HCT: 25.2 % — ABNORMAL LOW (ref 36.0–46.0)
Hemoglobin: 7.8 g/dL — ABNORMAL LOW (ref 12.0–15.0)
Immature Granulocytes: 1 %
Lymphocytes Relative: 11 %
Lymphs Abs: 1.2 10*3/uL (ref 0.7–4.0)
MCH: 29.2 pg (ref 26.0–34.0)
MCHC: 31 g/dL (ref 30.0–36.0)
MCV: 94.4 fL (ref 80.0–100.0)
Monocytes Absolute: 0.9 10*3/uL (ref 0.1–1.0)
Monocytes Relative: 9 %
Neutro Abs: 8.2 10*3/uL — ABNORMAL HIGH (ref 1.7–7.7)
Neutrophils Relative %: 76 %
Platelets: 165 10*3/uL (ref 150–400)
RBC: 2.67 MIL/uL — ABNORMAL LOW (ref 3.87–5.11)
RDW: 13.9 % (ref 11.5–15.5)
WBC: 10.6 10*3/uL — ABNORMAL HIGH (ref 4.0–10.5)
nRBC: 0 % (ref 0.0–0.2)

## 2021-11-23 LAB — CBC
HCT: 24.5 % — ABNORMAL LOW (ref 36.0–46.0)
Hemoglobin: 7.7 g/dL — ABNORMAL LOW (ref 12.0–15.0)
MCH: 29.5 pg (ref 26.0–34.0)
MCHC: 31.4 g/dL (ref 30.0–36.0)
MCV: 93.9 fL (ref 80.0–100.0)
Platelets: 197 10*3/uL (ref 150–400)
RBC: 2.61 MIL/uL — ABNORMAL LOW (ref 3.87–5.11)
RDW: 14 % (ref 11.5–15.5)
WBC: 12.1 10*3/uL — ABNORMAL HIGH (ref 4.0–10.5)
nRBC: 0 % (ref 0.0–0.2)

## 2021-11-23 LAB — MAGNESIUM: Magnesium: 2.1 mg/dL (ref 1.7–2.4)

## 2021-11-23 MED ORDER — HYDRALAZINE HCL 20 MG/ML IJ SOLN: 10.0000 mg | Freq: Four times a day (QID) | INTRAMUSCULAR | Status: DC | PRN

## 2021-11-23 NOTE — Progress Notes (Signed)
PROGRESS NOTE    Shannon [name]  ZJQ:[id number] DOB: [birth year] DOA: 11/21/2021 PCP: Mosie Lukes, MD    Chief Complaint  Patient presents with   Fall   Hip Pain    Brief Narrative:  76 year old female with past medical history of hypertension, gastroesophageal reflux disease, nonischemic cardiomyopathy (clean cath 2409), diastolic congestive heart failure (2015 Cath EF 50-55%), major depressive disorder, right breast cancer (Dx 2011 S/P Chemo/Radiation/Surgery), mild Alzheimer's dementia (Neuro note 06/2021) and hyperlipidemia who presented to [facility] emergency department via EMS after experiencing a fall on Saturday and ongoing left hip pain.  Upon evaluation in the emergency department patient was identified to have a left femoral neck fracture.  EDP discussed case with Dr. Lyla Glassing with orthopedic surgery who took the patient for left hip arthroplasty the evening of 10/23.    On physical therapy evaluation they are recommending skilled nursing facility placement.  Patient is now awaiting bed availability.     Assessment & Plan:  Principal Problem:   Closed left hip fracture, initial encounter [facility]) Active Problems:   Fall   Chronic diastolic CHF (congestive heart failure) (HCC)   Essential hypertension   Alzheimer's dementia without behavioral disturbance (HCC)   Mixed hyperlipidemia   GERD without esophagitis   Major depressive disorder   Postoperative anemia due to acute blood loss    Assessment and Plan: * Closed left hip fracture, initial encounter [facility]) Patient presenting after mechanical fall noted to have a displaced left femoral neck fracture noted on plain films of the left hip and pelvis.  Patient seen in consultation by orthopedics and underwent left total hip arthroplasty per Dr. Lyla Glassing 11/21/2021 without any complications.   PT evaluation now complete, skilled nursing facility placement recommended. Weightbearing as tolerated with  walker Aspirin, SCDs, teds for DVT prophylaxis. Pain management per orthopedics.   Fall Mechanical fall at home, unlikely to be caused by a medical condition Resulting in left hip fracture, assessment and plan as above   Chronic diastolic CHF (congestive heart failure) (HCC) No clinical evidence of cardiogenic volume overload Strict input and output monitoring Daily weights Low-sodium diet   Essential hypertension Home regimen oral antihypertensive medications of Coreg and diltiazem have been resumed.  BP borderline later on this afternoon. Hold diltiazem. Titrate antihypertensive regimen as necessary to achieve adequate BP control PRN intravenous antihypertensives for excessively elevated blood pressure    Alzheimer's dementia without behavioral disturbance (HCC) Longstanding known history of dementia and cognitive deficit Continue home regimen of Aricept Minimizing uncomfortable stimuli Minimizing mood altering and sedating agents Encouraging family to remain at bedside is much as possible Frequent redirection by staff Fall precautions   Mixed hyperlipidemia Continue statin.     GERD without esophagitis PPI.    Major depressive disorder Continue psychotropic medication regimen  Postoperative anemia due to acute blood loss - Patient with hemoglobin noted at 7.8 this morning from 9.0 from 11.7 on admission. Patient denies any overt bleeding. Repeat CBC this afternoon. Follow H&H. Transfusion threshold hemoglobin < 7.          DVT prophylaxis: Aspirin Code Status: Full Family Communication: Updated patient.  No family at bedside. Disposition:-Hopefully tomorrow  Status is: Inpatient Remains inpatient appropriate because: Severity of illness.  Unsafe disposition.   Consultants:  Orthopedics: Dr. Lyla Glassing 11/21/2021  Procedures:  Plain films of the pelvis 11/21/2021 Chest x-ray 11/21/2021 Plan films of the left hip and pelvis 11/21/2021 Plain  films of the left knee 11/21/2021 Left total hip  arthroplasty per orthopedics: Dr. Lyla Glassing 11/21/2021  Antimicrobials:  Perioperative IV Ancef.   Subjective: Laying in bed.  Still with complaints of left hip pain.  No chest pain.  No shortness of breath.  Denies any overt bleeding.  Objective: Vitals:   11/22/21 1736 11/22/21 2037 11/23/21 0618 11/23/21 1305  BP: (!) 116/49 (!) 143/87 (!) 152/54 (!) 93/50  Pulse: 62 64 68 66  Resp: '18 16 12 18  '$ Temp: 97.8 F (36.6 C) 97.9 F (36.6 C) 98.6 F (37 C) 98.9 F (37.2 C)  TempSrc:  Oral    SpO2: 100% 100% 100% 95%  Weight:      Height:        Intake/Output Summary (Last 24 hours) at 11/23/2021 2044 Last data filed at 11/23/2021 0618 Gross per 24 hour  Intake 420 ml  Output --  Net 420 ml   Filed Weights   11/21/21 0858 11/21/21 2100  Weight: 70.3 kg 77.9 kg    Examination:  General exam: Appears calm and comfortable  Respiratory system: Clear to auscultation anterior lung fields.  No wheezes, no crackles, no rhonchi.  Fair air movement.  Speaking in full sentences. Respiratory effort normal. Cardiovascular system: S1 & S2 heard, RRR. No JVD, murmurs, rubs, gallops or clicks. No pedal edema. Gastrointestinal system: Abdomen is nondistended, soft and nontender. No organomegaly or masses felt. Normal bowel sounds heard. Central nervous system: Alert and oriented. No focal neurological deficits. Extremities: Left hip with dressing in place, some tenderness to palpation.  No lower extremity edema. Skin: No rashes, lesions or ulcers Psychiatry: Judgement and insight appear normal. Mood & affect appropriate.     Data Reviewed:   CBC: Recent Labs  Lab 11/21/21 1135 11/22/21 0337 11/23/21 0800 11/23/21 1355  WBC 10.9* 13.6* 10.6* 12.1*  NEUTROABS 8.8*  --  8.2*  --   HGB 11.7* 9.0* 7.8* 7.7*  HCT 36.7 28.4* 25.2* 24.5*  MCV 92.7 92.8 94.4 93.9  PLT 194 193 165 287    Basic Metabolic Panel: Recent Labs  Lab  11/21/21 1135 11/22/21 0337 11/23/21 0800  NA 140 140 142  K 3.8 4.4 4.1  CL 106 108 111  CO2 '28 26 26  '$ GLUCOSE 110* 146* 111*  BUN '17 18 21  '$ CREATININE 0.79 0.78 0.88  CALCIUM 8.4* 8.3* 8.4*  MG  --   --  2.1    GFR: Estimated Creatinine Clearance: 50.5 mL/min (by C-G formula based on SCr of 0.88 mg/dL).  Liver Function Tests: Recent Labs  Lab 11/22/21 0337 11/23/21 0800  AST 39 72*  ALT 26 21  ALKPHOS 65 66  BILITOT 0.6 0.5  PROT 5.5* 5.7*  ALBUMIN 3.2* 3.3*    CBG: No results for input(s): "GLUCAP" in the last 168 hours.   No results found for this or any previous visit (from the past 240 hour(s)).       Radiology Studies: No results found.      Scheduled Meds:  aspirin  81 mg Oral BID   carvedilol  25 mg Oral BID WC   cholecalciferol  1,000 Units Oral Daily   diltiazem  120 mg Oral QHS   docusate sodium  100 mg Oral BID   donepezil  10 mg Oral QHS   famotidine  20 mg Oral Daily   feeding supplement  237 mL Oral BID BM   folic acid  1 mg Oral Daily   gabapentin  300 mg Oral BID   loratadine  10 mg Oral  Daily   nystatin   Topical TID   ondansetron (ZOFRAN) IV  4 mg Intravenous Once   pantoprazole  40 mg Oral Daily   pentosan polysulfate  200 mg Oral BID PC   rosuvastatin  20 mg Oral QHS   venlafaxine XR  225 mg Oral Daily   Continuous Infusions:  methocarbamol (ROBAXIN) IV       LOS: 2 days    Time spent: 35 minutes    Irine Seal, MD [facility]   To contact the attending provider between 7A-7P or the covering provider during after hours 7P-7A, please log into the web site [email or web address] and access using universal [place] password for that web site. If you do not have the password, please call the hospital operator.  11/23/2021, 8:44 PM

## 2021-11-23 NOTE — Assessment & Plan Note (Addendum)
   Patient presented after mechanical fall noted to have a displaced left femoral neck fracture noted on plain films of the left hip and pelvis.   Patient seen in consultation by orthopedics and underwent left total hip arthroplasty per Dr. Lyla Glassing 11/21/2021 without any complications.    PT evaluation now complete, skilled nursing facility placement recommended.  Weightbearing as tolerated with walker  Aspirin, SCDs, teds for DVT prophylaxis.  Pain management per orthopedics.  Patient will be discharged to SNF and will need outpatient follow-up with orthopedics 2 weeks postdischarge.

## 2021-11-23 NOTE — NC FL2 (Addendum)
[place] LEVEL OF CARE SCREENING TOOL     IDENTIFICATION  Patient Name: Shannon [name] Birthdate: 1945-11-23 Sex: female Admission Date (Current Location): 11/21/2021  [facility] and [place] Number:  Herbalist and Address:  [facility],  [address] [place], [place]      Provider Number: [id number]  Attending Physician Name and Address:  Eugenie Filler, MD  Relative Name and Phone Number:  Buddy Duty, daughter @ [phone]    Current Level of Care: Hospital Recommended Level of Care: [place] Prior Approval Number:    Date Approved/Denied:   PASRR Number: [id number]  Discharge Plan: SNF    Current Diagnoses: Patient Active Problem List   Diagnosis Date Noted   Chronic diastolic CHF (congestive heart failure) ([place]) 11/23/2021   Closed left hip fracture, initial encounter ([place]) 11/21/2021   Grade I diastolic dysfunction 01/74/9449   Fall 11/21/2021   Alzheimer's dementia without behavioral disturbance ([place]) 03/02/2021   Sleep apnea 01/12/2021   Depression with anxiety 01/12/2021   Mild neurocognitive disorder due to Alzheimer's disease 12/01/2020   History of melanoma 11/17/2020   Palpitation 10/28/2020   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   Right knee pain 07/10/2020   Urinary frequency 02/11/2020   Debility 12/23/2017   Left knee pain 12/23/2017   Vitamin D deficiency 06/14/2017   Vertebral fracture, osteoporotic 04/05/2016   Left-sided back pain 03/06/2016   Chest pain 02/27/2016   Syncope 02/27/2016   Cataracts, bilateral 12/28/2015   Recurrent falls 12/28/2015   Family history- stomach cancer 09/08/2015   Family history of cancer 09/08/2015   [name] lesion 06/21/2015   Obesity 09/28/2014   Interstitial cystitis 09/28/2014   Pain in joint, ankle and foot 09/28/2014   Abnormal nuclear stress test 12/16/2013   Sternal pain 11/06/2013   Postoperative anemia due to  acute blood loss 02/13/2013   Congestive dilated cardiomyopathy ([place]) 12/30/2012   Breast cancer of lower-inner quadrant of right female breast 10/21/2012   Constipation 03/03/2012   OA (osteoarthritis) 09/07/2011   Dermatitis    Hyponatremia 08/28/2011   Osteoporosis 03/07/2011   Prediabetes 06/15/2009   Lipoma 02/10/2009   Dyspnea on exertion 02/10/2009   Mixed hyperlipidemia 08/11/2008   Major depressive disorder 08/11/2008   Essential hypertension 08/11/2008   GERD without esophagitis 08/11/2008   Craniopharyngioma 2000    Orientation RESPIRATION BLADDER Height & Weight     Self, Situation, Place  Normal Continent, External catheter (currently with purewick) Weight: 171 lb 11.8 oz (77.9 kg) Height:  '5\' 1"'$  (154.9 cm)  BEHAVIORAL SYMPTOMS/MOOD NEUROLOGICAL BOWEL NUTRITION STATUS      Continent    AMBULATORY STATUS COMMUNICATION OF NEEDS Skin   Limited Assist Verbally Other (Comment) (surgical incision only)                       Personal Care Assistance Level of Assistance  Bathing, Dressing Bathing Assistance: Limited assistance   Dressing Assistance: Limited assistance     Functional Limitations Info             SPECIAL CARE FACTORS FREQUENCY  PT (By licensed PT), OT (By licensed OT)     PT Frequency: 5x/wl OT Frequency: 5x/wk            Contractures Contractures Info: Not present    Additional Factors Info  Code Status, Allergies Code Status Info: Full Allergies Info: dilaudid  Current Medications (11/24/2021):  This is the current hospital active medication list Current Facility-Administered Medications  Medication Dose Route Frequency Provider Last Rate Last Admin   0.9 %  sodium chloride infusion (Manually program via Guardrails IV Fluids)   Intravenous Once Raenette Rover, NP       0.9 %  sodium chloride infusion (Manually program via Guardrails IV Fluids)   Intravenous Once Eugenie Filler, MD       acetaminophen  (TYLENOL) tablet 650 mg  650 mg Oral Q6H PRN Reubin Milan, MD   650 mg at 11/23/21 1529   Or   acetaminophen (TYLENOL) suppository 650 mg  650 mg Rectal Q6H PRN Reubin Milan, MD       acetaminophen (TYLENOL) tablet 325-650 mg  325-650 mg Oral Q6H PRN Rod Can, MD   325 mg at 11/24/21 1525   albuterol (PROVENTIL) (2.5 MG/3ML) 0.083% nebulizer solution 2.5 mg  2.5 mg Inhalation QID PRN Rod Can, MD       alum & mag hydroxide-simeth (MAALOX/MYLANTA) 200-200-20 MG/5ML suspension 30 mL  30 mL Oral Q4H PRN Swinteck, Aaron Edelman, MD       aspirin chewable tablet 81 mg  81 mg Oral BID Rod Can, MD   81 mg at 11/24/21 0959   carvedilol (COREG) tablet 25 mg  25 mg Oral BID WC Rod Can, MD   25 mg at 11/24/21 2202   cholecalciferol (VITAMIN D3) 25 MCG (1000 UNIT) tablet 1,000 Units  1,000 Units Oral Daily Swinteck, Aaron Edelman, MD   1,000 Units at 11/24/21 1000   diphenhydrAMINE (BENADRYL) 12.5 MG/5ML elixir 12.5-25 mg  12.5-25 mg Oral Q4H PRN Swinteck, Aaron Edelman, MD       docusate sodium (COLACE) capsule 100 mg  100 mg Oral BID Rod Can, MD   100 mg at 11/24/21 0959   donepezil (ARICEPT) tablet 10 mg  10 mg Oral QHS Swinteck, Aaron Edelman, MD   10 mg at 11/23/21 2125   famotidine (PEPCID) tablet 20 mg  20 mg Oral Daily Swinteck, Aaron Edelman, MD   20 mg at 11/24/21 1001   feeding supplement (ENSURE ENLIVE / ENSURE PLUS) liquid 237 mL  237 mL Oral BID BM Vernelle Emerald, MD   237 mL at 11/23/21 1334   fentaNYL (SUBLIMAZE) injection 25 mcg  25 mcg Intravenous Q2H PRN Reubin Milan, MD       folic acid (FOLVITE) tablet 1 mg  1 mg Oral Daily Swinteck, Aaron Edelman, MD   1 mg at 11/24/21 1001   gabapentin (NEURONTIN) capsule 300 mg  300 mg Oral BID Rod Can, MD   300 mg at 11/24/21 5427   hydrALAZINE (APRESOLINE) injection 10 mg  10 mg Intravenous Q6H PRN Shalhoub, Sherryll Burger, MD       HYDROcodone-acetaminophen (NORCO) 7.5-325 MG per tablet 1-2 tablet  1-2 tablet Oral Q4H PRN Swinteck,  Aaron Edelman, MD       HYDROcodone-acetaminophen (NORCO/VICODIN) 5-325 MG per tablet 1-2 tablet  1-2 tablet Oral Q4H PRN Rod Can, MD   1 tablet at 11/24/21 1525   hydrOXYzine (ATARAX) tablet 12.5-25 mg  12.5-25 mg Oral BID PRN Rod Can, MD       loratadine (CLARITIN) tablet 10 mg  10 mg Oral Daily Swinteck, Aaron Edelman, MD   10 mg at 11/24/21 1000   menthol-cetylpyridinium (CEPACOL) lozenge 3 mg  1 lozenge Oral PRN Swinteck, Aaron Edelman, MD       Or   phenol (CHLORASEPTIC) mouth spray 1 spray  1 spray Mouth/Throat  PRN Rod Can, MD       methocarbamol (ROBAXIN) tablet 500 mg  500 mg Oral Q6H PRN Rod Can, MD   500 mg at 11/23/21 2242   Or   methocarbamol (ROBAXIN) 500 mg in dextrose 5 % 50 mL IVPB  500 mg Intravenous Q6H PRN Swinteck, Aaron Edelman, MD       metoCLOPramide (REGLAN) tablet 5-10 mg  5-10 mg Oral Q8H PRN Swinteck, Aaron Edelman, MD       Or   metoCLOPramide (REGLAN) injection 5-10 mg  5-10 mg Intravenous Q8H PRN Swinteck, Aaron Edelman, MD       morphine (PF) 2 MG/ML injection 0.5-1 mg  0.5-1 mg Intravenous Q2H PRN Swinteck, Aaron Edelman, MD       nystatin (MYCOSTATIN/NYSTOP) topical powder   Topical TID Rod Can, MD   Given at 11/24/21 1125   ondansetron (ZOFRAN) tablet 4 mg  4 mg Oral Q6H PRN Reubin Milan, MD       Or   ondansetron [facility]) injection 4 mg  4 mg Intravenous Q6H PRN Reubin Milan, MD   4 mg at 11/21/21 1256   ondansetron [facility]) injection 4 mg  4 mg Intravenous Once Reubin Milan, MD       pantoprazole (PROTONIX) EC tablet 40 mg  40 mg Oral Daily Rod Can, MD   40 mg at 11/24/21 0959   pentosan polysulfate (ELMIRON) capsule 200 mg  200 mg Oral BID PC Swinteck, Aaron Edelman, MD   200 mg at 11/23/21 1759   polyethylene glycol (MIRALAX / GLYCOLAX) packet 17 g  17 g Oral Daily PRN Swinteck, Aaron Edelman, MD       rosuvastatin (CRESTOR) tablet 20 mg  20 mg Oral QHS Swinteck, Aaron Edelman, MD   20 mg at 11/23/21 2236   venlafaxine XR (EFFEXOR-XR) 24 hr capsule 225 mg  225 mg  Oral Daily Rod Can, MD   225 mg at 11/24/21 0109     Discharge Medications: Please see discharge summary for a list of discharge medications.  Relevant Imaging Results:  Relevant Lab Results:   Additional Information SS# [id number]  Lennart Pall, LCSW

## 2021-11-23 NOTE — Assessment & Plan Note (Addendum)
   Patient maintained on home regimen psychotropic medication.

## 2021-11-23 NOTE — Progress Notes (Signed)
Transition of Care (TOC) -30 day Note       Patient Details  Name:  Naviyah Schaffert MRN:  [id number] Date of Birth: 09/08/1945   Transition of Care [facility]) CM/SW Contact  Name:  Lennart Pall, [place] Phone Number:  [phone] Date:  11/23/21 Time:  1536   MUST ID: [id number]   To Whom it May Concern:   Please be advised that the above patient will require a short-term nursing home stay, anticipated 30 days or less rehabilitation and strengthening. The plan is for return home.

## 2021-11-23 NOTE — Progress Notes (Signed)
Pt placed on cpap but unable to tolerate mask.  Cpap mask removed per pt request, RN aware.  Pt encouraged to call should she want to try cpap again later tonight.

## 2021-11-23 NOTE — Assessment & Plan Note (Addendum)
.   Home regimen oral antihypertensive medications of Coreg and diltiazem were initially resumed.  . BP the evening of 11/23/2021 noted to be soft and as such diltiazem held.  . Continue Coreg. Marland Kitchen PRN intravenous antihypertensives for excessively elevated blood pressure

## 2021-11-23 NOTE — Assessment & Plan Note (Addendum)
   Mechanical fall at home, unlikely to be caused by a medical condition  Resulting in left hip fracture, assessment and plan as above   Patient to be discharged to SNF.

## 2021-11-23 NOTE — Assessment & Plan Note (Addendum)
-   Patient with hemoglobin noted at 6.7 from 7.8 from 9.0 from 11.7 on admission. Patient denies any overt bleeding. -Transfused 2 units packed red blood cells. -Hemoglobin posttransfusion currently at 9.6. Follow H&H. Transfusion threshold hemoglobin < 7.

## 2021-11-23 NOTE — Progress Notes (Signed)
    Subjective:  Patient reports pain as mild to moderate.  Denies N/V/CP/SOB. She is having expected soreness in her thigh this morning. Denies tingling and numbness in LE bilaterally.   Objective:   VITALS:   Vitals:   11/22/21 1312 11/22/21 1736 11/22/21 2037 11/23/21 0618  BP: (!) 103/49 (!) 116/49 (!) 143/87 (!) 152/54  Pulse: 66 62 64 68  Resp: $Remo'18 18 16 12  'fqNCQ$ Temp: (!) 97.5 F (36.4 C) 97.8 F (36.6 C) 97.9 F (36.6 C) 98.6 F (37 C)  TempSrc: Oral  Oral   SpO2: 100% 100% 100% 100%  Weight:      Height:        Patient lying in bed, sleeping, woken up for exam this morning. NAD.  ABD soft Neurovascular intact Sensation intact distally Intact pulses distally Dorsiflexion/Plantar flexion intact Incision: dressing C/D/I No cellulitis present Compartment soft   Lab Results  Component Value Date   WBC 13.6 (H) 11/22/2021   HGB 9.0 (L) 11/22/2021   HCT 28.4 (L) 11/22/2021   MCV 92.8 11/22/2021   PLT 193 11/22/2021   BMET    Component Value Date/Time   NA 140 11/22/2021 0337   NA 144 08/07/2016 1029   K 4.4 11/22/2021 0337   K 3.9 08/07/2016 1029   CL 108 11/22/2021 0337   CL 107 04/08/2012 1018   CO2 26 11/22/2021 0337   CO2 30 (H) 08/07/2016 1029   GLUCOSE 146 (H) 11/22/2021 0337   GLUCOSE 105 08/07/2016 1029   GLUCOSE 100 (H) 04/08/2012 1018   BUN 18 11/22/2021 0337   BUN 10.3 08/07/2016 1029   CREATININE 0.78 11/22/2021 0337   CREATININE 0.84 07/02/2018 1534   CREATININE 0.8 08/07/2016 1029   CALCIUM 8.3 (L) 11/22/2021 0337   CALCIUM 9.4 08/07/2016 1029   EGFR 72 (L) 08/07/2016 1029   GFRNONAA >60 11/22/2021 4287     Assessment/Plan: 2 Days Post-Op   Principal Problem:   Closed left hip fracture, initial encounter ([place]) Active Problems:   Mixed hyperlipidemia   Major depressive disorder   Essential hypertension   GERD without esophagitis   Alzheimer's dementia without behavioral disturbance ([place])   Fall at home, initial encounter    Chronic diastolic CHF (congestive heart failure) ([place])   WBAT with walker DVT ppx: Aspirin, SCDs, TEDS PO pain control PT/OT: Patient had some dizziness and low blood pressure with 1st PT session yesterday. She was then able to ambulate 60 feet with PT during her 2nd session.  Dispo: Patient is under care of the medical team, disposition per their recommendation. Discussion about SNF. Pain medication and DVT ppx printed in chart.    Charlott Rakes, PA-C 11/23/2021, 7:15 AM   [facility]  [facility] [address], [address], [place], [place] [address] Phone: [phone] [email or web address] Facebook  [employer]

## 2021-11-23 NOTE — Assessment & Plan Note (Addendum)
   Longstanding known history of dementia and cognitive deficit  Patient was maintained on home regimen of Aricept  Minimizing uncomfortable stimuli  Minimizing mood altering and sedating agents  Encouraging family to remain at bedside is much as possible  Frequent redirection by staff  Fall precautions

## 2021-11-23 NOTE — Assessment & Plan Note (Addendum)
.   No clinical evidence of cardiogenic volume overload . Patient with some complaints of worsening shortness of breath over the past 24 hours after transfusion. . We will give Lasix 20 mg IV x1. . Strict input and output monitoring . Daily weights . Low-sodium diet

## 2021-11-23 NOTE — TOC Initial Note (Signed)
Transition of Care [facility]) - Initial/Assessment Note    Patient Details  Name: Shannon [name] MRN: [id number] Date of Birth: 01-02-1946  Transition of Care [facility]) CM/SW Contact:    Lennart Pall, LCSW Phone Number: 11/23/2021, 2:38 PM  Clinical Narrative:                 Met with pt today to introduce TOC/ CSW role and review PT recommendation for SNF.  Pt is pleasant and aware of SNF recs, however, "I really want to go home.  How long would I have to stay?"  Pt has reluctantly agreed to this plan for short term SNF.  Will begin bed search process.  Expected Discharge Plan: Skilled Nursing Facility Barriers to Discharge: Continued Medical Work up, SNF Pending bed offer   Patient Goals and CMS Choice Patient states their goals for this hospitalization and ongoing recovery are:: prefers to dc home (vs. SNF)      Expected Discharge Plan and Services Expected Discharge Plan: [place] In-house Referral: Clinical Social Work     Living arrangements for the past 2 months: Single Family Home                                      Prior Living Arrangements/Services Living arrangements for the past 2 months: Single Family Home Lives with:: Spouse Patient language and need for interpreter reviewed:: Yes Do you feel safe going back to the place where you live?: Yes      Need for Family Participation in Patient Care: Yes (Comment) Care giver support system in place?: Yes (comment)   Criminal Activity/Legal Involvement Pertinent to Current Situation/Hospitalization: No - Comment as needed  Activities of Daily Living Home Assistive Devices/Equipment: Environmental consultant (specify type), Shower chair with back, Eyeglasses (reading glasses) ADL Screening (condition at time of admission) Patient's cognitive ability adequate to safely complete daily activities?: Yes Is the patient deaf or have difficulty hearing?: No Does the patient have difficulty seeing, even when wearing  glasses/contacts?: No Does the patient have difficulty concentrating, remembering, or making decisions?: Yes (some memory problems) Patient able to express need for assistance with ADLs?: Yes Does the patient have difficulty dressing or bathing?: No Independently performs ADLs?: No Communication: Independent Dressing (OT): Independent Grooming: Independent Feeding: Independent Bathing: Independent Toileting: Needs assistance Is this a change from baseline?: Change from baseline, expected to last >3days In/Out Bed: Needs assistance Is this a change from baseline?: Change from baseline, expected to last >3 days Walks in Home: Needs assistance Is this a change from baseline?: Change from baseline, expected to last >3 days Does the patient have difficulty walking or climbing stairs?: Yes Weakness of Legs: Left Weakness of Arms/Hands: None  Permission Sought/Granted Permission sought to share information with : Family Supports Permission granted to share information with : Yes, Verbal Permission Granted  Share Information with NAME: Buddy Duty     Permission granted to share info w Relationship: daughter  Permission granted to share info w Contact Information: [phone]  Emotional Assessment Appearance:: Appears stated age Attitude/Demeanor/Rapport: Engaged Affect (typically observed): Pleasant Orientation: : Oriented to Self, Oriented to Place, Oriented to Situation Alcohol / Substance Use: Not Applicable Psych Involvement: No (comment)  Admission diagnosis:  Closed fracture of left hip, initial encounter ([place]) [S72.002A] Fall, initial encounter [W19.XXXA] Closed left hip fracture, initial encounter [facility]) [S72.002A] Patient Active Problem List   Diagnosis Date Noted  Chronic diastolic CHF (congestive heart failure) ([place]) 11/23/2021   Closed left hip fracture, initial encounter ([place]) 11/21/2021   Grade I diastolic dysfunction 75/88/3254   Fall at home, initial  encounter 11/21/2021   Alzheimer's dementia without behavioral disturbance ([place]) 03/02/2021   Sleep apnea 01/12/2021   Depression with anxiety 01/12/2021   Mild neurocognitive disorder due to Alzheimer's disease 12/01/2020   History of melanoma 11/17/2020   Palpitation 10/28/2020   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   Right knee pain 07/10/2020   Urinary frequency 02/11/2020   Debility 12/23/2017   Left knee pain 12/23/2017   Vitamin D deficiency 06/14/2017   Vertebral fracture, osteoporotic 04/05/2016   Left-sided back pain 03/06/2016   Chest pain 02/27/2016   Syncope 02/27/2016   Cataracts, bilateral 12/28/2015   Recurrent falls 12/28/2015   Family history- stomach cancer 09/08/2015   Family history of cancer 09/08/2015   [name] lesion 06/21/2015   Obesity 09/28/2014   Interstitial cystitis 09/28/2014   Pain in joint, ankle and foot 09/28/2014   Abnormal nuclear stress test 12/16/2013   Sternal pain 11/06/2013   Postoperative anemia due to acute blood loss 02/13/2013   Congestive dilated cardiomyopathy ([place]) 12/30/2012   Breast cancer of lower-inner quadrant of right female breast 10/21/2012   Constipation 03/03/2012   OA (osteoarthritis) 09/07/2011   Dermatitis    Hyponatremia 08/28/2011   Osteoporosis 03/07/2011   Prediabetes 06/15/2009   Lipoma 02/10/2009   Dyspnea on exertion 02/10/2009   Mixed hyperlipidemia 08/11/2008   Major depressive disorder 08/11/2008   Essential hypertension 08/11/2008   GERD without esophagitis 08/11/2008   Craniopharyngioma 2000   PCP:  Mosie Lukes, MD Pharmacy:   [id number] [place], [address] [address]Phone: [phone]: [phone] [facility] [place] [address]Phone: [phone]: [phone]    Social Determinants of Health (SDOH)  Interventions    Readmission Risk Interventions    11/23/2021    2:35 PM  Readmission Risk Prevention Plan  Transportation Screening Complete  PCP or Specialist Appt within 5-7 Days Complete  Home Care Screening Complete  Medication Review (RN CM) Complete

## 2021-11-23 NOTE — Assessment & Plan Note (Addendum)
?   Statin.

## 2021-11-23 NOTE — Assessment & Plan Note (Addendum)
PPI ?

## 2021-11-23 NOTE — Progress Notes (Signed)
Physical Therapy Treatment Patient Details Name: Shannon [name] MRN: [id number] DOB: 08-Feb-1945 Today's Date: 11/23/2021   History of Present Illness 76 y.o. female with medical history significant of abnormal nuclear stress test, history of chest pain, palpitations, asthma as a child, dyspnea on exertion, right breast cancer, left carpal tunnel syndrome, bilateral cataracts, closed fracture of maxilla, dilated cardiomyopathy with last echocardiogram showing an EF of 55 to 60% and grade 1 diastolic dysfunction, craniopharyngioma, unspecified dermatitis, essential hypertension failed total left knee replacement, GERD, herpes zoster, measles, melanoma, mumps, hyperlipidemia, hyponatremia, interstitial cystitis, left-sided back pain, depression, neuropathy, GERD, hiatal hernia, mild Alzheimer's disease, prediabetes, recurrent falls, right knee pain, history of UTI, osteoporotic vertebral fractures, vitamin D deficiency who had a fall from the stairs at home 10.21.23. Dx of L femoral neck fx, s/p L AA-THA 11/21/21.    PT Comments    Pt ambulated 25' with RW from her bed to bathroom, then to recliner. Distance limited by pain, fatigue, and vtach on heart monitor (HR 107). ST-SNF recommended.    Recommendations for follow up therapy are one component of a multi-disciplinary discharge planning process, led by the attending physician.  Recommendations may be updated based on patient status, additional functional criteria and insurance authorization.  Follow Up Recommendations  Skilled nursing-short term rehab (<3 hours/day) Can patient physically be transported by private vehicle: Yes   Assistance Recommended at Discharge Intermittent Supervision/Assistance  Patient can return home with the following A little help with walking and/or transfers;A little help with bathing/dressing/bathroom;Assistance with cooking/housework;Assist for transportation;Help with stairs or ramp for entrance;Direct  supervision/assist for medications management   Equipment Recommendations  None recommended by PT    Recommendations for Other Services       Precautions / Restrictions Precautions Precautions: None;Fall Precaution Comments: fell PTA, family reports pt has had other falls but not able to provide details Restrictions Weight Bearing Restrictions: No     Mobility  Bed Mobility Overal bed mobility: Needs Assistance Bed Mobility: Supine to Sit     Supine to sit: Mod assist     General bed mobility comments: mod A to raise trunk    Transfers Overall transfer level: Needs assistance Equipment used: Rolling walker (2 wheels) Transfers: Sit to/from Stand Sit to Stand: Min assist           General transfer comment: cueing for walker managment and hand placement, min A to power up    Ambulation/Gait Ambulation/Gait assistance: Min guard Gait Distance (Feet): 25 Feet Assistive device: Rolling walker (2 wheels) Gait Pattern/deviations: Step-to pattern, Decreased step length - right, Decreased step length - left Gait velocity: decr     General Gait Details: VCs sequencing and positioning in RW, distance limited by pain and fatigue, distance limited due to vtach on monitor (HR 107) and pain   Stairs             Wheelchair Mobility    Modified Rankin (Stroke Patients Only)       Balance Overall balance assessment: Needs assistance Sitting-balance support: No upper extremity supported, Feet supported Sitting balance-Leahy Scale: Fair     Standing balance support: Reliant on assistive device for balance, During functional activity, Bilateral upper extremity supported Standing balance-Leahy Scale: Poor Standing balance comment: reliant on RW for balance.                            Cognition Arousal/Alertness: Awake/alert Behavior During Therapy: WFL for tasks assessed/performed Overall  Cognitive Status: Within Functional Limits for tasks  assessed                                          Exercises      General Comments        Pertinent Vitals/Pain Pain Assessment Pain Score: 8  Pain Location: incision site, L hip with activity Pain Descriptors / Indicators: Grimacing, Guarding Pain Intervention(s): Limited activity within patient's tolerance, Monitored during session, Patient requesting pain meds-RN notified, Repositioned, Ice applied    Home Living                          Prior Function            PT Goals (current goals can now be found in the care plan section) Acute Rehab PT Goals Patient Stated Goal: to go home, decrease pain PT Goal Formulation: With patient Time For Goal Achievement: 11/29/21 Potential to Achieve Goals: Good Progress towards PT goals: Progressing toward goals    Frequency    Min 3X/week      PT Plan Current plan remains appropriate    Co-evaluation              AM-PAC PT "6 Clicks" Mobility   Outcome Measure  Help needed turning from your back to your side while in a flat bed without using bedrails?: A Little Help needed moving from lying on your back to sitting on the side of a flat bed without using bedrails?: A Lot Help needed moving to and from a bed to a chair (including a wheelchair)?: A Little Help needed standing up from a chair using your arms (e.g., wheelchair or bedside chair)?: A Little Help needed to walk in hospital room?: A Little Help needed climbing 3-5 steps with a railing? : A Lot 6 Click Score: 16    End of Session Equipment Utilized During Treatment: Gait belt Activity Tolerance: Patient tolerated treatment well Patient left: in chair;with chair alarm set;with call bell/phone within reach Nurse Communication: Mobility status PT Visit Diagnosis: Difficulty in walking, not elsewhere classified (R26.2);Pain Pain - Right/Left: Left Pain - part of body: Hip     Time: 9675-9163 PT Time Calculation (min) (ACUTE  ONLY): 14 min  Charges:  $Gait Training: 8-22 mins                     Blondell Reveal Kistler PT 11/23/2021  Acute Rehabilitation Services  Office [phone]

## 2021-11-24 DIAGNOSIS — W19XXXA Unspecified fall, initial encounter: Secondary | ICD-10-CM | POA: Diagnosis not present

## 2021-11-24 DIAGNOSIS — I5032 Chronic diastolic (congestive) heart failure: Secondary | ICD-10-CM | POA: Diagnosis not present

## 2021-11-24 DIAGNOSIS — S72002A Fracture of unspecified part of neck of left femur, initial encounter for closed fracture: Secondary | ICD-10-CM | POA: Diagnosis not present

## 2021-11-24 DIAGNOSIS — G309 Alzheimer's disease, unspecified: Secondary | ICD-10-CM | POA: Diagnosis not present

## 2021-11-24 LAB — BASIC METABOLIC PANEL
Anion gap: 3 — ABNORMAL LOW (ref 5–15)
BUN: 20 mg/dL (ref 8–23)
CO2: 26 mmol/L (ref 22–32)
Calcium: 8.3 mg/dL — ABNORMAL LOW (ref 8.9–10.3)
Chloride: 111 mmol/L (ref 98–111)
Creatinine, Ser: 0.86 mg/dL (ref 0.44–1.00)
GFR, Estimated: >60 mL/min (ref >60)
Glucose, Bld: 95 mg/dL (ref 70–99)
Potassium: 4 mmol/L (ref 3.5–5.1)
Sodium: 140 mmol/L (ref 135–145)

## 2021-11-24 LAB — CBC WITH DIFFERENTIAL/PLATELET
Abs Immature Granulocytes: 0.1 10*3/uL — ABNORMAL HIGH (ref 0.00–0.07)
Basophils Absolute: 0 10*3/uL (ref 0.0–0.1)
Basophils Relative: 0 %
Eosinophils Absolute: 0.4 10*3/uL (ref 0.0–0.5)
Eosinophils Relative: 5 %
HCT: 21.3 % — ABNORMAL LOW (ref 36.0–46.0)
Hemoglobin: 6.7 g/dL — CL (ref 12.0–15.0)
Immature Granulocytes: 1 %
Lymphocytes Relative: 17 %
Lymphs Abs: 1.6 10*3/uL (ref 0.7–4.0)
MCH: 29.5 pg (ref 26.0–34.0)
MCHC: 31.5 g/dL (ref 30.0–36.0)
MCV: 93.8 fL (ref 80.0–100.0)
Monocytes Absolute: 0.7 10*3/uL (ref 0.1–1.0)
Monocytes Relative: 8 %
Neutro Abs: 6.3 10*3/uL (ref 1.7–7.7)
Neutrophils Relative %: 69 %
Platelets: 161 10*3/uL (ref 150–400)
RBC: 2.27 MIL/uL — ABNORMAL LOW (ref 3.87–5.11)
RDW: 13.9 % (ref 11.5–15.5)
WBC: 9.2 10*3/uL (ref 4.0–10.5)
nRBC: 0.2 % (ref 0.0–0.2)

## 2021-11-24 LAB — PREPARE RBC (CROSSMATCH)

## 2021-11-24 LAB — HEMOGLOBIN AND HEMATOCRIT, BLOOD
HCT: 33.4 % — ABNORMAL LOW (ref 36.0–46.0)
Hemoglobin: 10.7 g/dL — ABNORMAL LOW (ref 12.0–15.0)

## 2021-11-24 MED ORDER — SODIUM CHLORIDE 0.9% IV SOLUTION: Freq: Once | INTRAVENOUS | Status: DC

## 2021-11-24 MED ORDER — ACETAMINOPHEN 325 MG PO TABS
650.0000 mg | ORAL_TABLET | Freq: Once | ORAL | Status: AC
  Administered 2021-11-24: 650 mg via ORAL
  Filled 2021-11-24: qty 2

## 2021-11-24 MED ORDER — FUROSEMIDE 10 MG/ML IJ SOLN
20.0000 mg | Freq: Once | INTRAMUSCULAR | Status: AC
  Administered 2021-11-24: 20 mg via INTRAVENOUS
  Filled 2021-11-24: qty 2

## 2021-11-24 MED ORDER — DIPHENHYDRAMINE HCL 25 MG PO CAPS
25.0000 mg | ORAL_CAPSULE | Freq: Once | ORAL | Status: AC
  Administered 2021-11-24: 25 mg via ORAL
  Filled 2021-11-24: qty 1

## 2021-11-24 NOTE — Care Management Important Message (Signed)
Important Message  Patient Details IM Letter given Name: Shannon [name] MRN: [id number] Date of Birth: 04-Nov-1945   Medicare Important Message Given:  Yes     Kerin Salen 11/24/2021, 11:36 AM

## 2021-11-24 NOTE — Progress Notes (Signed)
Hemoglobin 6.7. Informed Raenette Rover.

## 2021-11-24 NOTE — Progress Notes (Signed)
Physical Therapy Treatment Patient Details Name: Shannon [name] MRN: [id number] DOB: 1945-03-12 Today's Date: 11/24/2021   History of Present Illness 76 y.o. female with medical history significant of abnormal nuclear stress test, history of chest pain, palpitations, asthma as a child, dyspnea on exertion, right breast cancer, left carpal tunnel syndrome, bilateral cataracts, closed fracture of maxilla, dilated cardiomyopathy with last echocardiogram showing an EF of 55 to 60% and grade 1 diastolic dysfunction, craniopharyngioma, unspecified dermatitis, essential hypertension failed total left knee replacement, GERD, herpes zoster, measles, melanoma, mumps, hyperlipidemia, hyponatremia, interstitial cystitis, left-sided back pain, depression, neuropathy, GERD, hiatal hernia, mild Alzheimer's disease, prediabetes, recurrent falls, right knee pain, history of UTI, osteoporotic vertebral fractures, vitamin D deficiency who had a fall from the stairs at home 10.21.23. Dx of L femoral neck fx, s/p L AA-THA 11/21/21.    PT Comments    Pt ambulated 39' with RW, distance limited by pain and fatigue. SaO2 98% on room air walking. ST-SNF recommended.    Recommendations for follow up therapy are one component of a multi-disciplinary discharge planning process, led by the attending physician.  Recommendations may be updated based on patient status, additional functional criteria and insurance authorization.  Follow Up Recommendations  Skilled nursing-short term rehab (<3 hours/day) Can patient physically be transported by private vehicle: Yes   Assistance Recommended at Discharge Intermittent Supervision/Assistance  Patient can return home with the following A little help with walking and/or transfers;A little help with bathing/dressing/bathroom;Assistance with cooking/housework;Assist for transportation;Help with stairs or ramp for entrance;Direct supervision/assist for medications management   Equipment  Recommendations  None recommended by PT    Recommendations for Other Services       Precautions / Restrictions Precautions Precautions: None;Fall Precaution Comments: fell PTA, family reports pt has had other falls but not able to provide details Restrictions Weight Bearing Restrictions: No     Mobility  Bed Mobility Overal bed mobility: Needs Assistance Bed Mobility: Supine to Sit     Supine to sit: Mod assist     General bed mobility comments: mod A to raise trunk    Transfers Overall transfer level: Needs assistance Equipment used: Rolling walker (2 wheels) Transfers: Sit to/from Stand Sit to Stand: Min assist           General transfer comment: cueing for walker managment and hand placement, min A to power up    Ambulation/Gait Ambulation/Gait assistance: Min guard Gait Distance (Feet): 40 Feet Assistive device: Rolling walker (2 wheels) Gait Pattern/deviations: Step-to pattern, Decreased step length - right, Decreased step length - left Gait velocity: decr     General Gait Details: VCs sequencing and positioning in RW, distance limited by pain and fatigue, SaO2 98% on room air walking   Stairs             Wheelchair Mobility    Modified Rankin (Stroke Patients Only)       Balance Overall balance assessment: Needs assistance Sitting-balance support: No upper extremity supported, Feet supported Sitting balance-Leahy Scale: Fair     Standing balance support: Reliant on assistive device for balance, During functional activity, Bilateral upper extremity supported Standing balance-Leahy Scale: Poor Standing balance comment: reliant on RW for balance.                            Cognition Arousal/Alertness: Awake/alert Behavior During Therapy: WFL for tasks assessed/performed Overall Cognitive Status: Within Functional Limits for tasks assessed  Exercises Total Joint  Exercises Ankle Circles/Pumps: AROM, 20 reps, Both, Supine Heel Slides: AAROM, Left, 10 reps, Supine Hip ABduction/ADduction: AAROM, Left, 10 reps, Supine    General Comments        Pertinent Vitals/Pain Pain Assessment Pain Score: 8  Pain Location: incision site, L hip with activity Pain Descriptors / Indicators: Grimacing, Guarding Pain Intervention(s): Limited activity within patient's tolerance, Monitored during session, Patient requesting pain meds-RN notified, Ice applied    Home Living                          Prior Function            PT Goals (current goals can now be found in the care plan section) Acute Rehab PT Goals Patient Stated Goal: to go home, decrease pain PT Goal Formulation: With patient Time For Goal Achievement: 11/29/21 Potential to Achieve Goals: Good Progress towards PT goals: Progressing toward goals    Frequency    Min 3X/week      PT Plan Current plan remains appropriate    Co-evaluation              AM-PAC PT "6 Clicks" Mobility   Outcome Measure  Help needed turning from your back to your side while in a flat bed without using bedrails?: A Little Help needed moving from lying on your back to sitting on the side of a flat bed without using bedrails?: A Lot Help needed moving to and from a bed to a chair (including a wheelchair)?: A Little Help needed standing up from a chair using your arms (e.g., wheelchair or bedside chair)?: A Little Help needed to walk in hospital room?: A Little Help needed climbing 3-5 steps with a railing? : A Lot 6 Click Score: 16    End of Session Equipment Utilized During Treatment: Gait belt Activity Tolerance: Patient limited by pain Patient left: in chair;with chair alarm set;with call bell/phone within reach Nurse Communication: Mobility status PT Visit Diagnosis: Difficulty in walking, not elsewhere classified (R26.2);Pain Pain - Right/Left: Left Pain - part of body: Hip      Time: 8756-4332 PT Time Calculation (min) (ACUTE ONLY): 27 min  Charges:  $Gait Training: 8-22 mins $Therapeutic Exercise: 8-22 mins                     Blondell Reveal Kistler PT 11/24/2021  Acute Rehabilitation Services  Office [phone]

## 2021-11-24 NOTE — Progress Notes (Signed)
PROGRESS NOTE    Shannon [name]  IHK:[id number] DOB: 09-Apr-1945 DOA: 11/21/2021 PCP: Mosie Lukes, MD    Chief Complaint  Patient presents with   Fall   Hip Pain    Brief Narrative:  76 year old female with past medical history of hypertension, gastroesophageal reflux disease, nonischemic cardiomyopathy (clean cath 7564), diastolic congestive heart failure (2015 Cath EF 50-55%), major depressive disorder, right breast cancer (Dx 2011 S/P Chemo/Radiation/Surgery), mild Alzheimer's dementia (Neuro note 06/2021) and hyperlipidemia who presented to [facility] emergency department via EMS after experiencing a fall on Saturday and ongoing left hip pain.  Upon evaluation in the emergency department patient was identified to have a left femoral neck fracture.  EDP discussed case with Dr. Lyla Glassing with orthopedic surgery who took the patient for left hip arthroplasty the evening of 10/23.    On physical therapy evaluation they are recommending skilled nursing facility placement.  Patient is now awaiting bed availability.     Assessment & Plan:  Principal Problem:   Closed left hip fracture, initial encounter [facility]) Active Problems:   Fall   Chronic diastolic CHF (congestive heart failure) (HCC)   Essential hypertension   Alzheimer's dementia without behavioral disturbance (HCC)   Mixed hyperlipidemia   GERD without esophagitis   Major depressive disorder   Postoperative anemia due to acute blood loss    Assessment and Plan: * Closed left hip fracture, initial encounter [facility]) Patient presented after mechanical fall noted to have a displaced left femoral neck fracture noted on plain films of the left hip and pelvis.  Patient seen in consultation by orthopedics and underwent left total hip arthroplasty per Dr. Lyla Glassing 11/21/2021 without any complications.   PT evaluation now complete, skilled nursing facility placement recommended. Weightbearing as tolerated with  walker Aspirin, SCDs, teds for DVT prophylaxis. Pain management per orthopedics.   Fall Mechanical fall at home, unlikely to be caused by a medical condition Resulting in left hip fracture, assessment and plan as above  Awaiting SNF placement.   Chronic diastolic CHF (congestive heart failure) (HCC) No clinical evidence of cardiogenic volume overload Strict input and output monitoring Daily weights Low-sodium diet   Essential hypertension Home regimen oral antihypertensive medications of Coreg and diltiazem have been resumed.  BP yesterday evening and as such diltiazem held.. Continue to hold diltiazem. Titrate antihypertensive regimen as necessary to achieve adequate BP control PRN intravenous antihypertensives for excessively elevated blood pressure    Alzheimer's dementia without behavioral disturbance (HCC) Longstanding known history of dementia and cognitive deficit Continue home regimen of Aricept Minimizing uncomfortable stimuli Minimizing mood altering and sedating agents Encouraging family to remain at bedside is much as possible Frequent redirection by staff Fall precautions   Mixed hyperlipidemia Continue statin.     GERD without esophagitis PPI.   Major depressive disorder Continue psychotropic medication regimen  Postoperative anemia due to acute blood loss - Patient with hemoglobin noted at 6.7 this morning from 7.8 from 9.0 from 11.7 on admission. Patient denies any overt bleeding. -Transfused 2 units packed red blood cells. -CBC posttransfusion. Follow H&H. Transfusion threshold hemoglobin < 7.          DVT prophylaxis: Aspirin Code Status: Full Family Communication: Updated patient.  No family at bedside. Disposition:-Hopefully tomorrow  Status is: Inpatient Remains inpatient appropriate because: Severity of illness.  Unsafe disposition.   Consultants:  Orthopedics: Dr. Lyla Glassing 11/21/2021  Procedures:  Plain films of the  pelvis 11/21/2021 Chest x-ray 11/21/2021 Plan films of the left  hip and pelvis 11/21/2021 Plain films of the left knee 11/21/2021 Left total hip arthroplasty per orthopedics: Dr. Lyla Glassing 11/21/2021 Transfuse 2 units packed red blood cells 11/24/2021  Antimicrobials:  Perioperative IV Ancef.   Subjective: Patient complaining of fatigue.  States she is very sleepy.  Denies any overt bleeding.  No chest pain.  No shortness of breath.    Objective: Vitals:   11/24/21 0933 11/24/21 1145 11/24/21 1216 11/24/21 1432  BP: 120/66 121/67 124/68 133/64  Pulse: 62 67 71 66  Resp: 16  16   Temp: 99.1 F (37.3 C) 98.8 F (37.1 C) 98.7 F (37.1 C) 98 F (36.7 C)  TempSrc: Oral Oral Oral Oral  SpO2: 98% 100% (!) 89% 99%  Weight:      Height:        Intake/Output Summary (Last 24 hours) at 11/24/2021 1739 Last data filed at 11/24/2021 1432 Gross per 24 hour  Intake 1153.34 ml  Output 700 ml  Net 453.34 ml   Filed Weights   11/21/21 0858 11/21/21 2100  Weight: 70.3 kg 77.9 kg    Examination:  General exam: .  Pallor.  NAD. Respiratory system: Lungs clear to auscultation bilaterally anterior lung fields.  No wheezes, no crackles, no rhonchi.  Fair air movement.  Speaking in full sentences.  Cardiovascular system: Regular rate rhythm no murmurs rubs or gallops.  No JVD.  No lower extremity edema.  Gastrointestinal system: Abdomen is soft, nontender, nondistended, positive bowel sounds.  No rebound.  No guarding.   Central nervous system: Alert and oriented. No focal neurological deficits. Extremities: Left hip with dressing in place, some tenderness to palpation.  No lower extremity edema. Skin: No rashes, lesions or ulcers Psychiatry: Judgement and insight appear normal. Mood & affect appropriate.     Data Reviewed:   CBC: Recent Labs  Lab 11/21/21 1135 11/22/21 0337 11/23/21 0800 11/23/21 1355 11/24/21 0321 11/24/21 1544  WBC 10.9* 13.6* 10.6* 12.1* 9.2  --    NEUTROABS 8.8*  --  8.2*  --  6.3  --   HGB 11.7* 9.0* 7.8* 7.7* 6.7* 10.7*  HCT 36.7 28.4* 25.2* 24.5* 21.3* 33.4*  MCV 92.7 92.8 94.4 93.9 93.8  --   PLT 194 193 165 197 161  --     Basic Metabolic Panel: Recent Labs  Lab 11/21/21 1135 11/22/21 0337 11/23/21 0800 11/24/21 0321  NA 140 140 142 140  K 3.8 4.4 4.1 4.0  CL 106 108 111 111  CO2 '28 26 26 26  '$ GLUCOSE 110* 146* 111* 95  BUN '17 18 21 20  '$ CREATININE 0.79 0.78 0.88 0.86  CALCIUM 8.4* 8.3* 8.4* 8.3*  MG  --   --  2.1  --     GFR: Estimated Creatinine Clearance: 51.7 mL/min (by C-G formula based on SCr of 0.86 mg/dL).  Liver Function Tests: Recent Labs  Lab 11/22/21 0337 11/23/21 0800  AST 39 72*  ALT 26 21  ALKPHOS 65 66  BILITOT 0.6 0.5  PROT 5.5* 5.7*  ALBUMIN 3.2* 3.3*    CBG: No results for input(s): "GLUCAP" in the last 168 hours.   No results found for this or any previous visit (from the past 240 hour(s)).       Radiology Studies: No results found.      Scheduled Meds:  sodium chloride   Intravenous Once   sodium chloride   Intravenous Once   aspirin  81 mg Oral BID   carvedilol  25 mg Oral  BID WC   cholecalciferol  1,000 Units Oral Daily   docusate sodium  100 mg Oral BID   donepezil  10 mg Oral QHS   famotidine  20 mg Oral Daily   feeding supplement  237 mL Oral BID BM   folic acid  1 mg Oral Daily   gabapentin  300 mg Oral BID   loratadine  10 mg Oral Daily   nystatin   Topical TID   ondansetron (ZOFRAN) IV  4 mg Intravenous Once   pantoprazole  40 mg Oral Daily   pentosan polysulfate  200 mg Oral BID PC   rosuvastatin  20 mg Oral QHS   venlafaxine XR  225 mg Oral Daily   Continuous Infusions:  methocarbamol (ROBAXIN) IV       LOS: 3 days    Time spent: 35 minutes    Irine Seal, MD [facility]   To contact the attending provider between 7A-7P or the covering provider during after hours 7P-7A, please log into the web site [email or web address] and  access using universal [place] password for that web site. If you do not have the password, please call the hospital operator.  11/24/2021, 5:39 PM

## 2021-11-25 DIAGNOSIS — W19XXXA Unspecified fall, initial encounter: Secondary | ICD-10-CM | POA: Diagnosis not present

## 2021-11-25 DIAGNOSIS — S72002A Fracture of unspecified part of neck of left femur, initial encounter for closed fracture: Secondary | ICD-10-CM | POA: Diagnosis not present

## 2021-11-25 DIAGNOSIS — G309 Alzheimer's disease, unspecified: Secondary | ICD-10-CM | POA: Diagnosis not present

## 2021-11-25 DIAGNOSIS — I5032 Chronic diastolic (congestive) heart failure: Secondary | ICD-10-CM | POA: Diagnosis not present

## 2021-11-25 LAB — CBC WITH DIFFERENTIAL/PLATELET
Abs Immature Granulocytes: 0.13 10*3/uL — ABNORMAL HIGH (ref 0.00–0.07)
Basophils Absolute: 0.1 10*3/uL (ref 0.0–0.1)
Basophils Relative: 1 %
Eosinophils Absolute: 0.4 10*3/uL (ref 0.0–0.5)
Eosinophils Relative: 4 %
HCT: 29.7 % — ABNORMAL LOW (ref 36.0–46.0)
Hemoglobin: 9.6 g/dL — ABNORMAL LOW (ref 12.0–15.0)
Immature Granulocytes: 1 %
Lymphocytes Relative: 14 %
Lymphs Abs: 1.3 10*3/uL (ref 0.7–4.0)
MCH: 29.3 pg (ref 26.0–34.0)
MCHC: 32.3 g/dL (ref 30.0–36.0)
MCV: 90.5 fL (ref 80.0–100.0)
Monocytes Absolute: 0.8 10*3/uL (ref 0.1–1.0)
Monocytes Relative: 9 %
Neutro Abs: 6.5 10*3/uL (ref 1.7–7.7)
Neutrophils Relative %: 71 %
Platelets: 180 10*3/uL (ref 150–400)
RBC: 3.28 MIL/uL — ABNORMAL LOW (ref 3.87–5.11)
RDW: 14.4 % (ref 11.5–15.5)
WBC: 9.1 10*3/uL (ref 4.0–10.5)
nRBC: 0.2 % (ref 0.0–0.2)

## 2021-11-25 LAB — TYPE AND SCREEN
ABO/RH(D): O POS
Antibody Screen: NEGATIVE
Unit division: 0
Unit division: 0

## 2021-11-25 LAB — BPAM RBC
Blood Product Expiration Date: 202311202359
Blood Product Expiration Date: 202311252359
ISSUE DATE / TIME: 202310260627
ISSUE DATE / TIME: 202310261149
Unit Type and Rh: 5100
Unit Type and Rh: 5100

## 2021-11-25 LAB — BASIC METABOLIC PANEL
Anion gap: 7 (ref 5–15)
BUN: 18 mg/dL (ref 8–23)
CO2: 27 mmol/L (ref 22–32)
Calcium: 8.5 mg/dL — ABNORMAL LOW (ref 8.9–10.3)
Chloride: 106 mmol/L (ref 98–111)
Creatinine, Ser: 0.69 mg/dL (ref 0.44–1.00)
GFR, Estimated: >60 mL/min (ref >60)
Glucose, Bld: 114 mg/dL — ABNORMAL HIGH (ref 70–99)
Potassium: 3.9 mmol/L (ref 3.5–5.1)
Sodium: 140 mmol/L (ref 135–145)

## 2021-11-25 MED ORDER — FUROSEMIDE 10 MG/ML IJ SOLN
20.0000 mg | Freq: Once | INTRAMUSCULAR | Status: AC
  Administered 2021-11-25: 20 mg via INTRAVENOUS
  Filled 2021-11-25: qty 2

## 2021-11-25 NOTE — Progress Notes (Signed)
PT refused CPAP.  

## 2021-11-25 NOTE — TOC Progression Note (Signed)
Transition of Care [place] [facility]) - Progression Note    Patient Details  Name: Shannon [name] MRN: [id number] Date of Birth: 08/07/1945  Transition of Care [facility]) CM/SW Contact  Lennart Pall, LCSW Phone Number: 11/25/2021, 2:06 PM  Clinical Narrative:    Pt and family have accepted SNF bed offer at [facility] who can admit pt tomorrow.  Will alert oncoming TOC to plans.   Expected Discharge Plan: [place] Barriers to Discharge: Continued Medical Work up, SNF Pending bed offer  Expected Discharge Plan and Services Expected Discharge Plan: [place] In-house Referral: Clinical Social Work     Living arrangements for the past 2 months: Single Family Home                                       Social Determinants of Health (SDOH) Interventions    Readmission Risk Interventions    11/23/2021    2:35 PM  Readmission Risk Prevention Plan  Transportation Screening Complete  PCP or Specialist Appt within 5-7 Days Complete  Home Care Screening Complete  Medication Review (RN CM) Complete

## 2021-11-25 NOTE — Progress Notes (Signed)
PROGRESS NOTE    Shannon [name]  MGN:[id number] DOB: [birth year] DOA: 11/21/2021 PCP: Mosie Lukes, MD    Chief Complaint  Patient presents with   Fall   Hip Pain    Brief Narrative:  76 year old female with past medical history of hypertension, gastroesophageal reflux disease, nonischemic cardiomyopathy (clean cath 9169), diastolic congestive heart failure (2015 Cath EF 50-55%), major depressive disorder, right breast cancer (Dx 2011 S/P Chemo/Radiation/Surgery), mild Alzheimer's dementia (Neuro note 06/2021) and hyperlipidemia who presented to [facility] emergency department via EMS after experiencing a fall on Saturday and ongoing left hip pain.  Upon evaluation in the emergency department patient was identified to have a left femoral neck fracture.  EDP discussed case with Dr. Lyla Glassing with orthopedic surgery who took the patient for left hip arthroplasty the evening of 10/23.    On physical therapy evaluation they are recommending skilled nursing facility placement.  Patient is now awaiting bed availability.     Assessment & Plan:  Principal Problem:   Closed left hip fracture, initial encounter [facility]) Active Problems:   Fall   Chronic diastolic CHF (congestive heart failure) (HCC)   Essential hypertension   Alzheimer's dementia without behavioral disturbance (HCC)   Mixed hyperlipidemia   GERD without esophagitis   Major depressive disorder   Postoperative anemia due to acute blood loss    Assessment and Plan: * Closed left hip fracture, initial encounter [facility]) Patient presented after mechanical fall noted to have a displaced left femoral neck fracture noted on plain films of the left hip and pelvis.  Patient seen in consultation by orthopedics and underwent left total hip arthroplasty per Dr. Lyla Glassing 11/21/2021 without any complications.   PT evaluation now complete, skilled nursing facility placement recommended. Weightbearing as tolerated with  walker Aspirin, SCDs, teds for DVT prophylaxis. Pain management per orthopedics.   Fall Mechanical fall at home, unlikely to be caused by a medical condition Resulting in left hip fracture, assessment and plan as above  Awaiting SNF placement.   Chronic diastolic CHF (congestive heart failure) (HCC) No clinical evidence of cardiogenic volume overload Patient with some complaints of worsening shortness of breath over the past 24 hours after transfusion. We will give Lasix 20 mg IV x1. Strict input and output monitoring Daily weights Low-sodium diet   Essential hypertension Home regimen oral antihypertensive medications of Coreg and diltiazem were initially resumed.  BP the evening of 11/23/2021 noted to be soft and as such diltiazem held.  Continue Coreg. PRN intravenous antihypertensives for excessively elevated blood pressure    Alzheimer's dementia without behavioral disturbance (HCC) Longstanding known history of dementia and cognitive deficit Continue home regimen of Aricept Minimizing uncomfortable stimuli Minimizing mood altering and sedating agents Encouraging family to remain at bedside is much as possible Frequent redirection by staff Fall precautions   Mixed hyperlipidemia Statin.   GERD without esophagitis PPI.   Major depressive disorder Continue psychotropic medication regimen  Postoperative anemia due to acute blood loss - Patient with hemoglobin noted at 6.7 from 7.8 from 9.0 from 11.7 on admission. Patient denies any overt bleeding. -Transfused 2 units packed red blood cells. -Hemoglobin posttransfusion currently at 9.6. Follow H&H. Transfusion threshold hemoglobin < 7.          DVT prophylaxis: Aspirin Code Status: Full Family Communication: Updated patient.  No family at bedside. Disposition:-Hopefully tomorrow to SNF.  Status is: Inpatient Remains inpatient appropriate because: Severity of illness.  Unsafe disposition.    Consultants:  Orthopedics: Dr.  Swinteck 11/21/2021  Procedures:  Plain films of the pelvis 11/21/2021 Chest x-ray 11/21/2021 Plan films of the left hip and pelvis 11/21/2021 Plain films of the left knee 11/21/2021 Left total hip arthroplasty per orthopedics: Dr. Lyla Glassing 11/21/2021 Transfuse 2 units packed red blood cells 11/24/2021  Antimicrobials:  Perioperative IV Ancef.   Subjective: Patient with complaints of shortness of breath with over the past 24 hours.  Some improvement with fatigue.  Noted to work with PT.  No chest pain.    Objective: Vitals:   11/25/21 1026 11/25/21 1115 11/25/21 1240 11/25/21 1259  BP:    (!) 121/56  Pulse:    71  Resp:    18  Temp:    98.4 F (36.9 C)  TempSrc:    Oral  SpO2: 98% 97%  98%  Weight:   77.8 kg   Height:        Intake/Output Summary (Last 24 hours) at 11/25/2021 1720 Last data filed at 11/25/2021 1606 Gross per 24 hour  Intake 1290 ml  Output 800 ml  Net 490 ml   Filed Weights   11/21/21 0858 11/21/21 2100 11/25/21 1240  Weight: 70.3 kg 77.9 kg 77.8 kg    Examination:  General exam: .  NAD Respiratory system: Some decreased breath sounds in the bases otherwise clear.  No wheezes, no crackles, no rhonchi.  Fair air movement.  Speaking in full sentences. Cardiovascular system: RRR no murmurs rubs or gallops.  No JVD.  No lower extremity edema.  Gastrointestinal system: Abdomen is soft, nontender, nondistended, positive bowel sounds.  No rebound.  No guarding.  Central nervous system: Alert and oriented. No focal neurological deficits. Extremities: Left hip with dressing in place, some tenderness to palpation.  No lower extremity edema. Skin: No rashes, lesions or ulcers Psychiatry: Judgement and insight appear normal. Mood & affect appropriate.     Data Reviewed:   CBC: Recent Labs  Lab 11/21/21 1135 11/22/21 0337 11/23/21 0800 11/23/21 1355 11/24/21 0321 11/24/21 1544 11/25/21 0326  WBC 10.9* 13.6*  10.6* 12.1* 9.2  --  9.1  NEUTROABS 8.8*  --  8.2*  --  6.3  --  6.5  HGB 11.7* 9.0* 7.8* 7.7* 6.7* 10.7* 9.6*  HCT 36.7 28.4* 25.2* 24.5* 21.3* 33.4* 29.7*  MCV 92.7 92.8 94.4 93.9 93.8  --  90.5  PLT 194 193 165 197 161  --  810    Basic Metabolic Panel: Recent Labs  Lab 11/21/21 1135 11/22/21 0337 11/23/21 0800 11/24/21 0321 11/25/21 0326  NA 140 140 142 140 140  K 3.8 4.4 4.1 4.0 3.9  CL 106 108 111 111 106  CO2 '28 26 26 26 27  '$ GLUCOSE 110* 146* 111* 95 114*  BUN '17 18 21 20 18  '$ CREATININE 0.79 0.78 0.88 0.86 0.69  CALCIUM 8.4* 8.3* 8.4* 8.3* 8.5*  MG  --   --  2.1  --   --     GFR: Estimated Creatinine Clearance: 55.6 mL/min (by C-G formula based on SCr of 0.69 mg/dL).  Liver Function Tests: Recent Labs  Lab 11/22/21 0337 11/23/21 0800  AST 39 72*  ALT 26 21  ALKPHOS 65 66  BILITOT 0.6 0.5  PROT 5.5* 5.7*  ALBUMIN 3.2* 3.3*    CBG: No results for input(s): "GLUCAP" in the last 168 hours.   No results found for this or any previous visit (from the past 240 hour(s)).       Radiology Studies: No results found.  Scheduled Meds:  sodium chloride   Intravenous Once   sodium chloride   Intravenous Once   aspirin  81 mg Oral BID   carvedilol  25 mg Oral BID WC   cholecalciferol  1,000 Units Oral Daily   docusate sodium  100 mg Oral BID   donepezil  10 mg Oral QHS   famotidine  20 mg Oral Daily   feeding supplement  237 mL Oral BID BM   folic acid  1 mg Oral Daily   gabapentin  300 mg Oral BID   loratadine  10 mg Oral Daily   nystatin   Topical TID   ondansetron (ZOFRAN) IV  4 mg Intravenous Once   pantoprazole  40 mg Oral Daily   pentosan polysulfate  200 mg Oral BID PC   rosuvastatin  20 mg Oral QHS   venlafaxine XR  225 mg Oral Daily   Continuous Infusions:  methocarbamol (ROBAXIN) IV       LOS: 4 days    Time spent: 35 minutes    Irine Seal, MD [facility]   To contact the attending provider between 7A-7P  or the covering provider during after hours 7P-7A, please log into the web site [email or web address] and access using universal [place] password for that web site. If you do not have the password, please call the hospital operator.  11/25/2021, 5:20 PM

## 2021-11-25 NOTE — Plan of Care (Signed)

## 2021-11-25 NOTE — Progress Notes (Signed)
Physical Therapy Treatment Patient Details Name: HERMELINDA DIEGEL MRN: [id number] DOB: 1945/02/13 Today's Date: 11/25/2021   History of Present Illness 76 y.o. female with medical history significant of abnormal nuclear stress test, history of chest pain, palpitations, asthma as a child, dyspnea on exertion, right breast cancer, left carpal tunnel syndrome, bilateral cataracts, closed fracture of maxilla, dilated cardiomyopathy with last echocardiogram showing an EF of 55 to 60% and grade 1 diastolic dysfunction, craniopharyngioma, unspecified dermatitis, essential hypertension failed total left knee replacement, GERD, herpes zoster, measles, melanoma, mumps, hyperlipidemia, hyponatremia, interstitial cystitis, left-sided back pain, depression, neuropathy, GERD, hiatal hernia, mild Alzheimer's disease, prediabetes, recurrent falls, right knee pain, history of UTI, osteoporotic vertebral fractures, vitamin D deficiency who had a fall from the stairs at home 10.21.23. Dx of L femoral neck fx, s/p L AA-THA 11/21/21.    PT Comments    Pt is progressing with mobility, but remains limited by "burning" pain down her left leg. Pt is min assist with ambulation and transfers. O2 sats 95% with gait and mild SOB with activity.Pt completed L LE there ex with min assist. Pt is scheduled to d/c to SNF today for continued rehab prior to returning home with family. Recommend continued acute skilled PT to maximize mobility and Independence for the next venue of care.  Recommendations for follow up therapy are one component of a multi-disciplinary discharge planning process, led by the attending physician.  Recommendations may be updated based on patient status, additional functional criteria and insurance authorization.  Follow Up Recommendations  Skilled nursing-short term rehab (<3 hours/day) Can patient physically be transported by private vehicle: Yes   Assistance Recommended at Discharge Frequent or constant  Supervision/Assistance  Patient can return home with the following A little help with walking and/or transfers;A little help with bathing/dressing/bathroom;Assistance with cooking/housework;Assist for transportation;Help with stairs or ramp for entrance;Direct supervision/assist for medications management   Equipment Recommendations  None recommended by PT    Recommendations for Other Services       Precautions / Restrictions Precautions Precautions: Fall Restrictions Weight Bearing Restrictions: Yes LLE Weight Bearing: Weight bearing as tolerated     Mobility  Bed Mobility Overal bed mobility: Needs Assistance Bed Mobility: Supine to Sit     Supine to sit: Min assist          Transfers Overall transfer level: Needs assistance Equipment used: Rolling walker (2 wheels) Transfers: Sit to/from Stand Sit to Stand: Min assist Stand pivot transfers: Min assist         General transfer comment: cueing for walker managment and hand placement, min A to power up    Ambulation/Gait Ambulation/Gait assistance: Min guard Gait Distance (Feet): 80 Feet Assistive device: Rolling walker (2 wheels) Gait Pattern/deviations: Step-to pattern, Decreased step length - right, Decreased step length - left, Trunk flexed Gait velocity: decr     General Gait Details: VCs sequencing and positioning in RW, distance limited by pain and fatigue, SaO2 95% on room air walking   Stairs             Wheelchair Mobility    Modified Rankin (Stroke Patients Only)       Balance Overall balance assessment: Needs assistance Sitting-balance support: No upper extremity supported, Feet supported Sitting balance-Leahy Scale: Good     Standing balance support: Reliant on assistive device for balance, During functional activity, Bilateral upper extremity supported Standing balance-Leahy Scale: Poor  Cognition Arousal/Alertness:  Awake/alert Behavior During Therapy: WFL for tasks assessed/performed Overall Cognitive Status: Within Functional Limits for tasks assessed                                          Exercises Total Joint Exercises Ankle Circles/Pumps: AROM, 20 reps, Both, Supine Quad Sets: AROM, Strengthening, Both, 10 reps, Supine Heel Slides: AAROM, Left, 10 reps, Supine Hip ABduction/ADduction: AAROM, Left, 10 reps, Supine    General Comments        Pertinent Vitals/Pain Pain Assessment Pain Assessment: 0-10 Pain Score: 8  Pain Location: incision site, L hip with activity Pain Descriptors / Indicators: Burning, Grimacing Pain Intervention(s): Limited activity within patient's tolerance, Repositioned, Monitored during session, Patient requesting pain meds-RN notified    Home Living                          Prior Function            PT Goals (current goals can now be found in the care plan section) Progress towards PT goals: Progressing toward goals    Frequency    Min 3X/week      PT Plan Current plan remains appropriate    Co-evaluation              AM-PAC PT "6 Clicks" Mobility   Outcome Measure  Help needed turning from your back to your side while in a flat bed without using bedrails?: A Little Help needed moving from lying on your back to sitting on the side of a flat bed without using bedrails?: A Little Help needed moving to and from a bed to a chair (including a wheelchair)?: A Little Help needed standing up from a chair using your arms (e.g., wheelchair or bedside chair)?: A Little Help needed to walk in hospital room?: A Little Help needed climbing 3-5 steps with a railing? : A Lot 6 Click Score: 17    End of Session Equipment Utilized During Treatment: Gait belt Activity Tolerance: Patient limited by pain Patient left: in chair;with chair alarm set;with call bell/phone within reach Nurse Communication: Mobility status PT Visit  Diagnosis: Difficulty in walking, not elsewhere classified (R26.2);Pain Pain - Right/Left: Left Pain - part of body: Hip     Time: 1157-2620 PT Time Calculation (min) (ACUTE ONLY): 29 min  Charges:  $Gait Training: 8-22 mins $Therapeutic Exercise: 8-22 mins                       Lelon Mast 11/25/2021, 12:03 PM

## 2021-11-26 DIAGNOSIS — Z9989 Dependence on other enabling machines and devices: Secondary | ICD-10-CM | POA: Diagnosis not present

## 2021-11-26 DIAGNOSIS — M25552 Pain in left hip: Secondary | ICD-10-CM | POA: Diagnosis not present

## 2021-11-26 DIAGNOSIS — M25561 Pain in right knee: Secondary | ICD-10-CM | POA: Diagnosis not present

## 2021-11-26 DIAGNOSIS — R600 Localized edema: Secondary | ICD-10-CM | POA: Diagnosis not present

## 2021-11-26 DIAGNOSIS — I959 Hypotension, unspecified: Secondary | ICD-10-CM | POA: Diagnosis not present

## 2021-11-26 DIAGNOSIS — F0280 Dementia in other diseases classified elsewhere without behavioral disturbance: Secondary | ICD-10-CM | POA: Diagnosis not present

## 2021-11-26 DIAGNOSIS — I5032 Chronic diastolic (congestive) heart failure: Secondary | ICD-10-CM | POA: Diagnosis not present

## 2021-11-26 DIAGNOSIS — L309 Dermatitis, unspecified: Secondary | ICD-10-CM | POA: Diagnosis not present

## 2021-11-26 DIAGNOSIS — Z79899 Other long term (current) drug therapy: Secondary | ICD-10-CM | POA: Diagnosis not present

## 2021-11-26 DIAGNOSIS — I110 Hypertensive heart disease with heart failure: Secondary | ICD-10-CM | POA: Diagnosis not present

## 2021-11-26 DIAGNOSIS — Z7401 Bed confinement status: Secondary | ICD-10-CM | POA: Diagnosis not present

## 2021-11-26 DIAGNOSIS — K449 Diaphragmatic hernia without obstruction or gangrene: Secondary | ICD-10-CM | POA: Diagnosis not present

## 2021-11-26 DIAGNOSIS — I2609 Other pulmonary embolism with acute cor pulmonale: Secondary | ICD-10-CM | POA: Diagnosis not present

## 2021-11-26 DIAGNOSIS — J309 Allergic rhinitis, unspecified: Secondary | ICD-10-CM | POA: Diagnosis not present

## 2021-11-26 DIAGNOSIS — G4733 Obstructive sleep apnea (adult) (pediatric): Secondary | ICD-10-CM | POA: Diagnosis not present

## 2021-11-26 DIAGNOSIS — R262 Difficulty in walking, not elsewhere classified: Secondary | ICD-10-CM | POA: Diagnosis not present

## 2021-11-26 DIAGNOSIS — M7989 Other specified soft tissue disorders: Secondary | ICD-10-CM | POA: Diagnosis not present

## 2021-11-26 DIAGNOSIS — Z471 Aftercare following joint replacement surgery: Secondary | ICD-10-CM | POA: Diagnosis not present

## 2021-11-26 DIAGNOSIS — R52 Pain, unspecified: Secondary | ICD-10-CM | POA: Diagnosis not present

## 2021-11-26 DIAGNOSIS — I2699 Other pulmonary embolism without acute cor pulmonale: Secondary | ICD-10-CM | POA: Diagnosis not present

## 2021-11-26 DIAGNOSIS — Z96642 Presence of left artificial hip joint: Secondary | ICD-10-CM | POA: Diagnosis not present

## 2021-11-26 DIAGNOSIS — S72002D Fracture of unspecified part of neck of left femur, subsequent encounter for closed fracture with routine healing: Secondary | ICD-10-CM | POA: Diagnosis not present

## 2021-11-26 DIAGNOSIS — F4323 Adjustment disorder with mixed anxiety and depressed mood: Secondary | ICD-10-CM | POA: Diagnosis not present

## 2021-11-26 DIAGNOSIS — G629 Polyneuropathy, unspecified: Secondary | ICD-10-CM | POA: Diagnosis not present

## 2021-11-26 DIAGNOSIS — Z87440 Personal history of urinary (tract) infections: Secondary | ICD-10-CM | POA: Diagnosis not present

## 2021-11-26 DIAGNOSIS — Z9181 History of falling: Secondary | ICD-10-CM | POA: Diagnosis not present

## 2021-11-26 DIAGNOSIS — S72032D Displaced midcervical fracture of left femur, subsequent encounter for closed fracture with routine healing: Secondary | ICD-10-CM | POA: Diagnosis not present

## 2021-11-26 DIAGNOSIS — D649 Anemia, unspecified: Secondary | ICD-10-CM | POA: Diagnosis not present

## 2021-11-26 DIAGNOSIS — R609 Edema, unspecified: Secondary | ICD-10-CM | POA: Diagnosis not present

## 2021-11-26 DIAGNOSIS — Z96653 Presence of artificial knee joint, bilateral: Secondary | ICD-10-CM | POA: Diagnosis not present

## 2021-11-26 DIAGNOSIS — R350 Frequency of micturition: Secondary | ICD-10-CM | POA: Diagnosis not present

## 2021-11-26 DIAGNOSIS — D62 Acute posthemorrhagic anemia: Secondary | ICD-10-CM | POA: Diagnosis not present

## 2021-11-26 DIAGNOSIS — E785 Hyperlipidemia, unspecified: Secondary | ICD-10-CM | POA: Diagnosis not present

## 2021-11-26 DIAGNOSIS — J45909 Unspecified asthma, uncomplicated: Secondary | ICD-10-CM | POA: Diagnosis not present

## 2021-11-26 DIAGNOSIS — G309 Alzheimer's disease, unspecified: Secondary | ICD-10-CM | POA: Diagnosis not present

## 2021-11-26 DIAGNOSIS — F329 Major depressive disorder, single episode, unspecified: Secondary | ICD-10-CM | POA: Diagnosis not present

## 2021-11-26 DIAGNOSIS — G4700 Insomnia, unspecified: Secondary | ICD-10-CM | POA: Diagnosis not present

## 2021-11-26 DIAGNOSIS — I2693 Single subsegmental pulmonary embolism without acute cor pulmonale: Secondary | ICD-10-CM | POA: Diagnosis not present

## 2021-11-26 DIAGNOSIS — G4730 Sleep apnea, unspecified: Secondary | ICD-10-CM | POA: Diagnosis not present

## 2021-11-26 DIAGNOSIS — M6281 Muscle weakness (generalized): Secondary | ICD-10-CM | POA: Diagnosis not present

## 2021-11-26 DIAGNOSIS — I10 Essential (primary) hypertension: Secondary | ICD-10-CM | POA: Diagnosis not present

## 2021-11-26 DIAGNOSIS — R41841 Cognitive communication deficit: Secondary | ICD-10-CM | POA: Diagnosis not present

## 2021-11-26 DIAGNOSIS — K219 Gastro-esophageal reflux disease without esophagitis: Secondary | ICD-10-CM | POA: Diagnosis not present

## 2021-11-26 DIAGNOSIS — Z1152 Encounter for screening for COVID-19: Secondary | ICD-10-CM | POA: Diagnosis not present

## 2021-11-26 DIAGNOSIS — R404 Transient alteration of awareness: Secondary | ICD-10-CM | POA: Diagnosis not present

## 2021-11-26 DIAGNOSIS — Z85820 Personal history of malignant melanoma of skin: Secondary | ICD-10-CM | POA: Diagnosis not present

## 2021-11-26 DIAGNOSIS — Z4789 Encounter for other orthopedic aftercare: Secondary | ICD-10-CM | POA: Diagnosis not present

## 2021-11-26 DIAGNOSIS — Z0389 Encounter for observation for other suspected diseases and conditions ruled out: Secondary | ICD-10-CM | POA: Diagnosis not present

## 2021-11-26 DIAGNOSIS — R531 Weakness: Secondary | ICD-10-CM | POA: Diagnosis not present

## 2021-11-26 DIAGNOSIS — K5900 Constipation, unspecified: Secondary | ICD-10-CM | POA: Diagnosis not present

## 2021-11-26 DIAGNOSIS — Z7982 Long term (current) use of aspirin: Secondary | ICD-10-CM | POA: Diagnosis not present

## 2021-11-26 DIAGNOSIS — Z853 Personal history of malignant neoplasm of breast: Secondary | ICD-10-CM | POA: Diagnosis not present

## 2021-11-26 DIAGNOSIS — E782 Mixed hyperlipidemia: Secondary | ICD-10-CM | POA: Diagnosis not present

## 2021-11-26 DIAGNOSIS — S72002A Fracture of unspecified part of neck of left femur, initial encounter for closed fracture: Secondary | ICD-10-CM | POA: Diagnosis not present

## 2021-11-26 DIAGNOSIS — R269 Unspecified abnormalities of gait and mobility: Secondary | ICD-10-CM | POA: Diagnosis not present

## 2021-11-26 DIAGNOSIS — M25562 Pain in left knee: Secondary | ICD-10-CM | POA: Diagnosis not present

## 2021-11-26 DIAGNOSIS — E559 Vitamin D deficiency, unspecified: Secondary | ICD-10-CM | POA: Diagnosis not present

## 2021-11-26 DIAGNOSIS — R0602 Shortness of breath: Secondary | ICD-10-CM | POA: Diagnosis not present

## 2021-11-26 DIAGNOSIS — G8918 Other acute postprocedural pain: Secondary | ICD-10-CM | POA: Diagnosis not present

## 2021-11-26 LAB — BASIC METABOLIC PANEL
Anion gap: 7 (ref 5–15)
BUN: 22 mg/dL (ref 8–23)
CO2: 31 mmol/L (ref 22–32)
Calcium: 9 mg/dL (ref 8.9–10.3)
Chloride: 104 mmol/L (ref 98–111)
Creatinine, Ser: 0.66 mg/dL (ref 0.44–1.00)
GFR, Estimated: >60 mL/min (ref >60)
Glucose, Bld: 105 mg/dL — ABNORMAL HIGH (ref 70–99)
Potassium: 3.9 mmol/L (ref 3.5–5.1)
Sodium: 142 mmol/L (ref 135–145)

## 2021-11-26 LAB — CBC
HCT: 34 % — ABNORMAL LOW (ref 36.0–46.0)
Hemoglobin: 11 g/dL — ABNORMAL LOW (ref 12.0–15.0)
MCH: 29.3 pg (ref 26.0–34.0)
MCHC: 32.4 g/dL (ref 30.0–36.0)
MCV: 90.4 fL (ref 80.0–100.0)
Platelets: 199 10*3/uL (ref 150–400)
RBC: 3.76 MIL/uL — ABNORMAL LOW (ref 3.87–5.11)
RDW: 14.4 % (ref 11.5–15.5)
WBC: 8.8 10*3/uL (ref 4.0–10.5)
nRBC: 0 % (ref 0.0–0.2)

## 2021-11-26 LAB — MAGNESIUM: Magnesium: 2 mg/dL (ref 1.7–2.4)

## 2021-11-26 MED ORDER — NYSTATIN 100000 UNIT/GM EX POWD: Freq: Three times a day (TID) | CUTANEOUS | 0 refills | Status: AC

## 2021-11-26 MED ORDER — DOCUSATE SODIUM 100 MG PO CAPS: 100.0000 mg | ORAL_CAPSULE | Freq: Two times a day (BID) | ORAL | 0 refills | Status: AC

## 2021-11-26 MED ORDER — ACETAMINOPHEN 325 MG PO TABS: 650.0000 mg | ORAL_TABLET | Freq: Four times a day (QID) | ORAL | Status: AC | PRN

## 2021-11-26 MED ORDER — POLYETHYLENE GLYCOL 3350 17 G PO PACK: 17.0000 g | PACK | Freq: Every day | ORAL | 0 refills | Status: DC | PRN

## 2021-11-26 NOTE — Plan of Care (Signed)
Plan of care reviewed and discussed with the patient. 

## 2021-11-26 NOTE — Progress Notes (Signed)
Report provided via phone to [place] staff for [facility]; Barbara verbalizes understanding of report and aware EMS has been called for transport.

## 2021-11-26 NOTE — TOC Transition Note (Signed)
Transition of Care [facility]) - CM/SW Discharge Note   Patient Details  Name: Shannon [name] MRN: [id number] Date of Birth: 1945-10-26  Transition of Care [facility]) CM/SW Contact:  Ross Ludwig, LCSW Phone Number: 11/26/2021, 11:15 AM   Clinical Narrative:     CSW spoke to [facility] and they can accept patient today.  Patient to be d/c'ed today to [facility] room 36.  Patient and family agreeable to plans will transport via ems RN to call report [phone].  CSW spoke to patient's daughter Shannon [name], and made her aware that patient is discharging today.    Final next level of care: Skilled Nursing Facility Barriers to Discharge: Barriers Resolved   Patient Goals and CMS Choice Patient states their goals for this hospitalization and ongoing recovery are:: To go to SNF for short term rehab, then return back home. CMS Medicare.gov Compare Post Acute Care list provided to:: Patient Choice offered to / list presented to : Patient  Discharge Placement PASRR number recieved: 11/23/21            Patient chooses bed at: [facility] Patient to be transferred to facility by: [facility] Name of family member notified: Daughter Shannon [name] Patient and family notified of of transfer: 11/26/21  Discharge Plan and Services In-house Referral: Clinical Social Work                                   Social Determinants of Health ([place]) Interventions     Readmission Risk Interventions    11/23/2021    2:35 PM  Readmission Risk Prevention Plan  Transportation Screening Complete  PCP or Specialist Appt within 5-7 Days Complete  Home Care Screening Complete  Medication Review (RN CM) Complete

## 2021-11-26 NOTE — Discharge Summary (Signed)
Physician Discharge Summary  Shannon [name] QMG:[id number] DOB: 09-13-1945 DOA: 11/21/2021  PCP: Mosie Lukes, MD  Admit date: 11/21/2021 Discharge date: 11/26/2021  Time spent: 55 minutes  Recommendations for Outpatient Follow-up:  Patient will discharge to SNF. Follow-up with MD at SNF.  Patient will need a basic metabolic profile, CBC done in 1 week to follow-up on electrolytes, renal function, H&H. Follow-up with Dr. Lyla Glassing, orthopedics in 2 weeks.   Discharge Diagnoses:  Principal Problem:   Closed left hip fracture, initial encounter [facility]) Active Problems:   Fall   Chronic diastolic CHF (congestive heart failure) (HCC)   Essential hypertension   Alzheimer's dementia without behavioral disturbance ([place])   Mixed hyperlipidemia   GERD without esophagitis   Major depressive disorder   Postoperative anemia due to acute blood loss   Discharge Condition: Stable and improved  Diet recommendation: Heart healthy  Filed Weights   11/21/21 0858 11/21/21 2100 11/25/21 1240  Weight: 70.3 kg 77.9 kg 77.8 kg    History of present illness:  HPI per Dr. Deliah Goody is a 76 y.o. female with medical history significant of abnormal nuclear stress test, history of chest pain, palpitations, asthma as a child, dyspnea on exertion, right breast cancer, left carpal tunnel syndrome, bilateral cataracts, closed fracture of maxilla, dilated cardiomyopathy with last echocardiogram showing an EF of 55 to 60% and grade 1 diastolic dysfunction, close to patient, craniopharyngioma, unspecified dermatitis, essential hypertension failed total left knee replacement, GERD, herpes zoster, measles, melanoma, mumps, hyperlipidemia, hyponatremia, interstitial cystitis, left-sided back pain, depression, neuropathy, GERD, hiatal hernia, mild Alzheimer's disease, prediabetes, recurrent falls, right knee pain, history of UTI, osteoporotic vertebral fractures, vitamin D deficiency who had a fall from the  stairs at home Saturday evening developing some hip pain, but was able to sleep Saturday night, woke up on Sunday being able to walk and even stood at the kitchen for a while while cooking.  She went to bed Sunday evening and woke up this morning around 0100 with severe pain on the left hip area and inability to bear weight. He denied fever, chills, rhinorrhea, sore throat, wheezing or hemoptysis.  No chest pain, palpitations, diaphoresis, PND, orthopnea or pitting edema of the lower extremities.  No abdominal pain, nausea, emesis, diarrhea, constipation, melena or hematochezia.  No flank pain, dysuria or hematuria.  She gets occasional frequency.  No polyuria, polydipsia, polyphagia or blurred vision.  She has hassling erythema   ED course: Initial vital signs were temperature 97.7 F, pulse 55, respiration 15, BP 151/70 mmHg O2 sat 95% on room air.  The patient received fentanyl 50 mcg IVP x1.   Lab work: CBC showed a white count 10.9, hemoglobin 11.7 g/dL platelets 194.  PT 13.3 and INR 1.0.  BMP with a glucose of 110 and calcium 8.4 mg/dL.  The rest of the BMP measurements were normal.   Imaging: No active cardiopulmonary disease on 2 view chest radiograph.  Left knee x-ray with no fractures and previous TKA.  Left hip x-ray showed displaced fracture through the neck of the left femur.   Hospital Course:   Assessment and Plan: * Closed left hip fracture, initial encounter [facility]) Patient presented after mechanical fall noted to have a displaced left femoral neck fracture noted on plain films of the left hip and pelvis.  Patient seen in consultation by orthopedics and underwent left total hip arthroplasty per Dr. Lyla Glassing 11/21/2021 without any complications.   PT evaluation now complete, skilled nursing  facility placement recommended. Weightbearing as tolerated with walker Aspirin, SCDs, teds for DVT prophylaxis. Pain management per orthopedics. Patient will be discharged to SNF and will need  outpatient follow-up with orthopedics 2 weeks postdischarge.  Fall Mechanical fall at home, unlikely to be caused by a medical condition Resulting in left hip fracture, assessment and plan as above  Patient to be discharged to SNF.    Chronic diastolic CHF (congestive heart failure) (HCC) No clinical evidence of cardiogenic volume overload Patient with some complaints of worsening shortness of breath on 11/25/2021.   Felt likely component of transfusion associated volume overload and patient given Lasix 20 mg IV x1 with a urine output of 2 L.   Patient improved clinically and was stable by day of discharge.    Essential hypertension Home regimen oral antihypertensive medications of Coreg and diltiazem were initially resumed.  BP the evening of 11/23/2021 noted to be soft and as such diltiazem held.   Patient maintained on home regimen Coreg. Diltiazem will be resumed on discharge.    Alzheimer's dementia without behavioral disturbance ([place]) Longstanding known history of dementia and cognitive deficit Patient was maintained on home regimen of Aricept Minimizing uncomfortable stimuli Minimizing mood altering and sedating agents Encouraging family to remain at bedside is much as possible Frequent redirection by staff Fall precautions   Mixed hyperlipidemia Patient maintained on statin.   GERD without esophagitis Patient maintained on Pepcid and PPI.   Major depressive disorder Patient maintained on home regimen psychotropic medication.  Postoperative anemia due to acute blood loss - Patient with hemoglobin noted at 6.7 from 7.8 from 9.0 from 11.7 on admission. Patient denied any overt bleeding. -Transfused 2 units packed red blood cells, with hemoglobin stabilizing at 11.0 by day of discharge.        Procedures: Plain films of the pelvis 11/21/2021 Chest x-ray 11/21/2021 Plan films of the left hip and pelvis 11/21/2021 Plain films of the left knee  11/21/2021 Left total hip arthroplasty per orthopedics: Dr. Lyla Glassing 11/21/2021 Transfuse 2 units packed red blood cells 11/24/2021  Consultations: Orthopedics: Dr. Lyla Glassing 11/21/2021    Discharge Exam: Vitals:   11/25/21 2245 11/26/21 0444  BP: (!) 160/78 (!) 143/51  Pulse: 63 60  Resp: 16 18  Temp: 98.1 F (36.7 C) 97.7 F (36.5 C)  SpO2: 97% 97%    General: NAD Cardiovascular: RRR no murmurs rubs or gallops.  No JVD.  No lower extremity edema. Respiratory: Clear to auscultation bilaterally.  No wheezes, no crackles, no rhonchi.  Fair air movement.  Speaking in full sentences.  Discharge Instructions   Discharge Instructions     Diet - low sodium heart healthy   Complete by: As directed    Increase activity slowly   Complete by: As directed    No wound care   Complete by: As directed       Allergies as of 11/26/2021       Reactions   Dilaudid [hydromorphone Hcl] Itching, Other (See Comments)   "Went Crazy"         Medication List     STOP taking these medications    naproxen sodium 220 MG tablet Commonly known as: ALEVE   telmisartan 40 MG tablet Commonly known as: MICARDIS       TAKE these medications    acetaminophen 325 MG tablet Commonly known as: TYLENOL Take 2 tablets (650 mg total) by mouth every 6 (six) hours as needed for mild pain or moderate pain (pain score 1-3  or temp > 100.5).   albuterol 108 (90 Base) MCG/ACT inhaler Commonly known as: VENTOLIN HFA INHALE 1-2 PUFFS INTO THE LUNGS EVERY 6 (SIX) HOURS AS NEEDED FOR WHEEZING OR SHORTNESS OF BREATH. What changed:  how much to take when to take this   aspirin 81 MG chewable tablet Commonly known as: Aspirin Childrens Chew 1 tablet (81 mg total) by mouth 2 (two) times daily with a meal.   CALCIUM 600 PO Take 1 tablet by mouth daily.   carvedilol 25 MG tablet Commonly known as: COREG TAKE 1 TABLET TWICE DAILY WITH A MEAL What changed: See the new instructions.    cetirizine 10 MG tablet Commonly known as: ZYRTEC Take 10 mg by mouth at bedtime.   diltiazem 120 MG 24 hr capsule Commonly known as: CARDIZEM CD TAKE 1 CAPSULE BY MOUTH EVERY DAY What changed:  how much to take when to take this   docusate sodium 100 MG capsule Commonly known as: COLACE Take 1 capsule (100 mg total) by mouth 2 (two) times daily. What changed:  medication strength how much to take when to take this   donepezil 10 MG tablet Commonly known as: ARICEPT TAKE 1 TABLET AT BEDTIME   famotidine 20 MG tablet Commonly known as: PEPCID TAKE 1 TABLET TWICE DAILY What changed: when to take this   ferrous sulfate 325 (65 FE) MG tablet Take 325 mg by mouth daily with breakfast.   folic acid 1 MG tablet Commonly known as: FOLVITE Take 1 tablet (1 mg total) by mouth daily.   gabapentin 300 MG capsule Commonly known as: NEURONTIN Take 1 capsule (300 mg total) by mouth 2 (two) times daily.   HYDROcodone-acetaminophen 10-325 MG tablet Commonly known as: Norco Take 0.5 tablets by mouth every 4 (four) hours as needed for up to 7 days for severe pain.   hydrOXYzine 25 MG tablet Commonly known as: ATARAX TAKE 0.5-1 TABLETS (12.5-25 MG TOTAL) BY MOUTH 2 (TWO) TIMES DAILY AS NEEDED. What changed:  how much to take when to take this   multivitamin with minerals Tabs tablet Take 1 tablet by mouth daily.   nystatin powder Commonly known as: MYCOSTATIN/NYSTOP Apply topically 3 (three) times daily for 7 days. Apply to affected areas ie under right breast   omeprazole 20 MG capsule Commonly known as: PRILOSEC Take 20 mg by mouth at bedtime.   pentosan polysulfate 100 MG capsule Commonly known as: ELMIRON Take 100 mg by mouth as directed. two at morning and two at night   polyethylene glycol 17 g packet Commonly known as: MIRALAX / GLYCOLAX Take 17 g by mouth daily as needed for mild constipation.   rosuvastatin 20 MG tablet Commonly known as: CRESTOR TAKE 1  TABLET AT BEDTIME   venlafaxine XR 75 MG 24 hr capsule Commonly known as: EFFEXOR-XR TAKE 3 CAPSULES EVERY DAY WITH BREAKFAST What changed: See the new instructions.   Vitamin D3 1000 units Caps Take 1,000 Units by mouth daily.       Allergies  Allergen Reactions   Dilaudid [Hydromorphone Hcl] Itching and Other (See Comments)    "Went Crazy"     Contact information for follow-up providers     Swinteck, Aaron Edelman, MD Follow up in 2 week(s).   Specialty: Orthopedic Surgery Why: For suture removal Contact information: [address] [place] [address] [phone]         MD AT SNF Follow up.  Contact information for after-discharge care     Destination     [facility] .   Service: Skilled Nursing Contact information: [address] [place] [phone]                      The results of significant diagnostics from this hospitalization (including imaging, microbiology, ancillary and laboratory) are listed below for reference.    Significant Diagnostic Studies: DG HIP PORT UNILAT WITH PELVIS 1V LEFT  Result Date: 11/21/2021 CLINICAL DATA:  Left hip fracture, left hip arthroplasty EXAM: DG HIP (WITH OR WITHOUT PELVIS) 1V PORT LEFT COMPARISON:  11/21/2021 FINDINGS: Nine fluoroscopic images are obtained during the performance of the procedure and are provided for interpretation only. Left femoral neck fracture is identified, with subsequent placement of a left hip arthroplasty in the expected position with no signs of acute complication. A cerclage wire surrounds the femoral component of the arthroplasty. Please refer to operative report. Fluoroscopy time: 11 seconds, 1.53 mGy IMPRESSION: 1. Left hip arthroplasty as above. Electronically Signed   By: Randa Ngo M.D.   On: 11/21/2021 19:58   DG Pelvis Portable  Result Date: 11/21/2021 CLINICAL  DATA:  Left hip arthroplasty, left hip fracture EXAM: PORTABLE PELVIS 1-2 VIEWS COMPARISON:  11/21/2021 FINDINGS: Frontal view of the bilateral hips was performed. Superior pelvis is excluded by collimation. Interval placement of a left hip arthroplasty, with cerclage wire surrounding the femoral component of the arthroplasty. Alignment is anatomic. Postsurgical changes are seen within the overlying soft tissues. Visualized portions of the pelvis and right hip are stable. IMPRESSION: 1. Left hip arthroplasty as above.  No signs of acute complication. Electronically Signed   By: Randa Ngo M.D.   On: 11/21/2021 19:57   DG C-Arm 1-60 Min-No Report  Result Date: 11/21/2021 Fluoroscopy was utilized by the requesting physician.  No radiographic interpretation.   DG C-Arm 1-60 Min-No Report  Result Date: 11/21/2021 Fluoroscopy was utilized by the requesting physician.  No radiographic interpretation.   DG Hip Unilat With Pelvis 2-3 Views Left  Result Date: 11/21/2021 CLINICAL DATA:  Fall 2 days ago. Left hip pain. Unable to ambulate. EXAM: DG HIP (WITH OR WITHOUT PELVIS) LV LEFT COMPARISON:  None Available. FINDINGS: A fracture through the neck of the left femur is displaced superiorly. Humeral head is located scratched at femoral head is located. No other acute osseous abnormality is present. Degenerative changes are present in the lower lumbar spine and SI joints. IMPRESSION: Displaced fracture through the neck of the left femur. Electronically Signed   By: San Morelle M.D.   On: 11/21/2021 10:47   DG Chest 2 View  Result Date: 11/21/2021 CLINICAL DATA:  Preoperative respiratory exam for hip fracture. EXAM: CHEST - 2 VIEW COMPARISON:  03/11/2020 FINDINGS: Lordotic positioning. Heart and mediastinal shadows are normal allowing technical features. Mild scarring/volume loss at the right lung base. The chest is otherwise clear. Old augmented fracture at the thoracolumbar junction region.  IMPRESSION: No active cardiopulmonary disease. Mild scarring/volume loss at the right lung base. Electronically Signed   By: Nelson Chimes M.D.   On: 11/21/2021 10:47   DG Knee Complete 4 Views Left  Result Date: 11/21/2021 CLINICAL DATA:  Fall with leg pain EXAM: LEFT KNEE - COMPLETE 3 VIEW COMPARISON:  None Available. FINDINGS: Prior total left knee arthroplasty. Hardware is intact. Evidence of fracture. Small joint effusion. Soft tissue swelling about the knee.  IMPRESSION: Prior total left knee arthroplasty without evidence of fracture. Electronically Signed   By: Yetta Glassman M.D.   On: 11/21/2021 10:46    Microbiology: No results found for this or any previous visit (from the past 240 hour(s)).   Labs: Basic Metabolic Panel: Recent Labs  Lab 11/22/21 0337 11/23/21 0800 11/24/21 0321 11/25/21 0326 11/26/21 0344  NA 140 142 140 140 142  K 4.4 4.1 4.0 3.9 3.9  CL 108 111 111 106 104  CO2 '26 26 26 27 31  '$ GLUCOSE 146* 111* 95 114* 105*  BUN '18 21 20 18 22  '$ CREATININE 0.78 0.88 0.86 0.69 0.66  CALCIUM 8.3* 8.4* 8.3* 8.5* 9.0  MG  --  2.1  --   --  2.0   Liver Function Tests: Recent Labs  Lab 11/22/21 0337 11/23/21 0800  AST 39 72*  ALT 26 21  ALKPHOS 65 66  BILITOT 0.6 0.5  PROT 5.5* 5.7*  ALBUMIN 3.2* 3.3*   No results for input(s): "LIPASE", "AMYLASE" in the last 168 hours. No results for input(s): "AMMONIA" in the last 168 hours. CBC: Recent Labs  Lab 11/21/21 1135 11/22/21 0337 11/23/21 0800 11/23/21 1355 11/24/21 0321 11/24/21 1544 11/25/21 0326 11/26/21 0344  WBC 10.9*   < > 10.6* 12.1* 9.2  --  9.1 8.8  NEUTROABS 8.8*  --  8.2*  --  6.3  --  6.5  --   HGB 11.7*   < > 7.8* 7.7* 6.7* 10.7* 9.6* 11.0*  HCT 36.7   < > 25.2* 24.5* 21.3* 33.4* 29.7* 34.0*  MCV 92.7   < > 94.4 93.9 93.8  --  90.5 90.4  PLT 194   < > 165 197 161  --  180 199   < > = values in this interval not displayed.   Cardiac Enzymes: No results for input(s): "CKTOTAL", "CKMB",  "CKMBINDEX", "TROPONINI" in the last 168 hours. BNP: BNP (last 3 results) No results for input(s): "BNP" in the last 8760 hours.  ProBNP (last 3 results) No results for input(s): "PROBNP" in the last 8760 hours.  CBG: No results for input(s): "GLUCAP" in the last 168 hours.     Signed:  Irine Seal MD.  [facility] 11/26/2021, 10:04 AM

## 2021-11-29 ENCOUNTER — Emergency Department (HOSPITAL_COMMUNITY): Payer: Medicare Other

## 2021-11-29 ENCOUNTER — Observation Stay (HOSPITAL_COMMUNITY)
Admission: EM | Admit: 2021-11-29 | Discharge: 2021-12-01 | Disposition: A | Discharge status: Skilled Nursing Facility | Payer: Medicare Other | Attending: Internal Medicine | Admitting: Internal Medicine

## 2021-11-29 ENCOUNTER — Emergency Department (HOSPITAL_BASED_OUTPATIENT_CLINIC_OR_DEPARTMENT_OTHER): Payer: Medicare Other

## 2021-11-29 ENCOUNTER — Other Ambulatory Visit: Payer: Self-pay

## 2021-11-29 ENCOUNTER — Encounter (HOSPITAL_COMMUNITY): Payer: Self-pay | Admitting: Emergency Medicine

## 2021-11-29 DIAGNOSIS — K219 Gastro-esophageal reflux disease without esophagitis: Secondary | ICD-10-CM | POA: Diagnosis not present

## 2021-11-29 DIAGNOSIS — M7989 Other specified soft tissue disorders: Secondary | ICD-10-CM | POA: Diagnosis present

## 2021-11-29 DIAGNOSIS — J45909 Unspecified asthma, uncomplicated: Secondary | ICD-10-CM | POA: Insufficient documentation

## 2021-11-29 DIAGNOSIS — G8918 Other acute postprocedural pain: Secondary | ICD-10-CM | POA: Insufficient documentation

## 2021-11-29 DIAGNOSIS — Z85820 Personal history of malignant melanoma of skin: Secondary | ICD-10-CM | POA: Diagnosis not present

## 2021-11-29 DIAGNOSIS — R52 Pain, unspecified: Secondary | ICD-10-CM

## 2021-11-29 DIAGNOSIS — I2699 Other pulmonary embolism without acute cor pulmonale: Secondary | ICD-10-CM | POA: Diagnosis not present

## 2021-11-29 DIAGNOSIS — R0602 Shortness of breath: Secondary | ICD-10-CM | POA: Diagnosis not present

## 2021-11-29 DIAGNOSIS — G4730 Sleep apnea, unspecified: Secondary | ICD-10-CM | POA: Diagnosis present

## 2021-11-29 DIAGNOSIS — Z1152 Encounter for screening for COVID-19: Secondary | ICD-10-CM | POA: Diagnosis not present

## 2021-11-29 DIAGNOSIS — Z853 Personal history of malignant neoplasm of breast: Secondary | ICD-10-CM | POA: Diagnosis not present

## 2021-11-29 DIAGNOSIS — F0390 Unspecified dementia without behavioral disturbance: Secondary | ICD-10-CM | POA: Diagnosis present

## 2021-11-29 DIAGNOSIS — D62 Acute posthemorrhagic anemia: Secondary | ICD-10-CM | POA: Diagnosis not present

## 2021-11-29 DIAGNOSIS — Z96653 Presence of artificial knee joint, bilateral: Secondary | ICD-10-CM | POA: Diagnosis not present

## 2021-11-29 DIAGNOSIS — I5032 Chronic diastolic (congestive) heart failure: Secondary | ICD-10-CM | POA: Diagnosis present

## 2021-11-29 DIAGNOSIS — F0280 Dementia in other diseases classified elsewhere without behavioral disturbance: Secondary | ICD-10-CM | POA: Diagnosis not present

## 2021-11-29 DIAGNOSIS — I10 Essential (primary) hypertension: Secondary | ICD-10-CM | POA: Diagnosis present

## 2021-11-29 DIAGNOSIS — I2693 Single subsegmental pulmonary embolism without acute cor pulmonale: Secondary | ICD-10-CM | POA: Diagnosis not present

## 2021-11-29 DIAGNOSIS — G4733 Obstructive sleep apnea (adult) (pediatric): Secondary | ICD-10-CM | POA: Diagnosis present

## 2021-11-29 DIAGNOSIS — Z96642 Presence of left artificial hip joint: Secondary | ICD-10-CM | POA: Diagnosis not present

## 2021-11-29 DIAGNOSIS — Z79899 Other long term (current) drug therapy: Secondary | ICD-10-CM | POA: Insufficient documentation

## 2021-11-29 DIAGNOSIS — I110 Hypertensive heart disease with heart failure: Secondary | ICD-10-CM | POA: Diagnosis not present

## 2021-11-29 DIAGNOSIS — R600 Localized edema: Secondary | ICD-10-CM | POA: Diagnosis not present

## 2021-11-29 DIAGNOSIS — E782 Mixed hyperlipidemia: Secondary | ICD-10-CM | POA: Diagnosis present

## 2021-11-29 DIAGNOSIS — Z7982 Long term (current) use of aspirin: Secondary | ICD-10-CM | POA: Diagnosis not present

## 2021-11-29 DIAGNOSIS — G309 Alzheimer's disease, unspecified: Secondary | ICD-10-CM | POA: Insufficient documentation

## 2021-11-29 DIAGNOSIS — Z0389 Encounter for observation for other suspected diseases and conditions ruled out: Secondary | ICD-10-CM | POA: Diagnosis not present

## 2021-11-29 DIAGNOSIS — Z86711 Personal history of pulmonary embolism: Secondary | ICD-10-CM | POA: Diagnosis present

## 2021-11-29 DIAGNOSIS — M25552 Pain in left hip: Secondary | ICD-10-CM | POA: Diagnosis not present

## 2021-11-29 DIAGNOSIS — R531 Weakness: Secondary | ICD-10-CM | POA: Diagnosis present

## 2021-11-29 HISTORY — DX: Other pulmonary embolism without acute cor pulmonale: I26.99

## 2021-11-29 LAB — RESP PANEL BY RT-PCR (FLU A&B, COVID) ARPGX2
Influenza A by PCR: NEGATIVE
Influenza B by PCR: NEGATIVE
SARS Coronavirus 2 by RT PCR: NEGATIVE

## 2021-11-29 LAB — COMPREHENSIVE METABOLIC PANEL
ALT: 30 U/L (ref 0–44)
AST: 39 U/L (ref 15–41)
Albumin: 3.2 g/dL — ABNORMAL LOW (ref 3.5–5.0)
Alkaline Phosphatase: 75 U/L (ref 38–126)
Anion gap: 6 (ref 5–15)
BUN: 18 mg/dL (ref 8–23)
CO2: 27 mmol/L (ref 22–32)
Calcium: 8.8 mg/dL — ABNORMAL LOW (ref 8.9–10.3)
Chloride: 104 mmol/L (ref 98–111)
Creatinine, Ser: 0.94 mg/dL (ref 0.44–1.00)
GFR, Estimated: >60 mL/min (ref >60)
Glucose, Bld: 110 mg/dL — ABNORMAL HIGH (ref 70–99)
Potassium: 4 mmol/L (ref 3.5–5.1)
Sodium: 137 mmol/L (ref 135–145)
Total Bilirubin: 0.7 mg/dL (ref 0.3–1.2)
Total Protein: 6 g/dL — ABNORMAL LOW (ref 6.5–8.1)

## 2021-11-29 LAB — URINALYSIS, ROUTINE W REFLEX MICROSCOPIC
Bilirubin Urine: NEGATIVE
Glucose, UA: NEGATIVE mg/dL
Hgb urine dipstick: NEGATIVE
Ketones, ur: NEGATIVE mg/dL
Leukocytes,Ua: NEGATIVE
Nitrite: NEGATIVE
Protein, ur: NEGATIVE mg/dL
Specific Gravity, Urine: 1.004 — ABNORMAL LOW (ref 1.005–1.030)
pH: 6 (ref 5.0–8.0)

## 2021-11-29 LAB — CBC WITH DIFFERENTIAL/PLATELET
Abs Immature Granulocytes: 0.18 10*3/uL — ABNORMAL HIGH (ref 0.00–0.07)
Basophils Absolute: 0.1 10*3/uL (ref 0.0–0.1)
Basophils Relative: 1 %
Eosinophils Absolute: 0.8 10*3/uL — ABNORMAL HIGH (ref 0.0–0.5)
Eosinophils Relative: 8 %
HCT: 34.1 % — ABNORMAL LOW (ref 36.0–46.0)
Hemoglobin: 10.5 g/dL — ABNORMAL LOW (ref 12.0–15.0)
Immature Granulocytes: 2 %
Lymphocytes Relative: 15 %
Lymphs Abs: 1.6 10*3/uL (ref 0.7–4.0)
MCH: 28.8 pg (ref 26.0–34.0)
MCHC: 30.8 g/dL (ref 30.0–36.0)
MCV: 93.4 fL (ref 80.0–100.0)
Monocytes Absolute: 1.2 10*3/uL — ABNORMAL HIGH (ref 0.1–1.0)
Monocytes Relative: 11 %
Neutro Abs: 6.9 10*3/uL (ref 1.7–7.7)
Neutrophils Relative %: 63 %
Platelets: 303 10*3/uL (ref 150–400)
RBC: 3.65 MIL/uL — ABNORMAL LOW (ref 3.87–5.11)
RDW: 13.9 % (ref 11.5–15.5)
WBC: 10.8 10*3/uL — ABNORMAL HIGH (ref 4.0–10.5)
nRBC: 0 % (ref 0.0–0.2)

## 2021-11-29 LAB — PROTIME-INR
INR: 1.1 (ref 0.8–1.2)
Prothrombin Time: 14.3 seconds (ref 11.4–15.2)

## 2021-11-29 LAB — TROPONIN I (HIGH SENSITIVITY): Troponin I (High Sensitivity): 4 ng/L (ref <18)

## 2021-11-29 LAB — APTT: aPTT: 27 seconds (ref 24–36)

## 2021-11-29 LAB — LACTIC ACID, PLASMA: Lactic Acid, Venous: 1.1 mmol/L (ref 0.5–1.9)

## 2021-11-29 MED ORDER — HEPARIN BOLUS VIA INFUSION
5000.0000 [IU] | Freq: Once | INTRAVENOUS | Status: AC
  Administered 2021-11-29: 5000 [IU] via INTRAVENOUS
  Filled 2021-11-29: qty 5000

## 2021-11-29 MED ORDER — IOHEXOL 350 MG/ML SOLN
75.0000 mL | Freq: Once | INTRAVENOUS | Status: AC | PRN
  Administered 2021-11-29: 75 mL via INTRAVENOUS

## 2021-11-29 MED ORDER — SODIUM CHLORIDE 0.9 % IV SOLN: Freq: Once | INTRAVENOUS | Status: AC

## 2021-11-29 MED ORDER — HEPARIN (PORCINE) 25000 UT/250ML-% IV SOLN
1100.0000 [IU]/h | INTRAVENOUS | Status: AC
  Administered 2021-11-29 – 2021-11-30 (×2): 1100 [IU]/h via INTRAVENOUS
  Filled 2021-11-29: qty 250

## 2021-11-29 NOTE — Assessment & Plan Note (Signed)
Admit to   Telemetry  Initiate heparin drip   Would likely benefit from case manager consult for long term anticoagulation Hold home blood pressure medications avoid hypotension Cycle cardiac enzymes Order echogram and lower extremity Dopplers  Most likely risk factors for hypercoagulable state being malignancy recent surgery

## 2021-11-29 NOTE — Assessment & Plan Note (Signed)
In the setting of recent surgical intervention per orthopedics most likely postoperative changes.  Plain imaging unremarkable.  Will have orthopedics follow-up in the morning.  Monitor for any sign of infection

## 2021-11-29 NOTE — Assessment & Plan Note (Signed)
Allow permissive hypertension for tonight 

## 2021-11-29 NOTE — ED Provider Notes (Signed)
[facility] DEPT Provider Note   CSN: [id number] Arrival date & time: 11/29/21  1444     History  Chief Complaint  Patient presents with   Weakness   Post-op Problem   Fatigue    Shannon [name] is a 77 y.o. female.  HPI Patient is status post left total hip arthroplasty by Dr. Lyla Glassing [phone].  Patient has been doing rehab at [facility] for 1 week.  She reports she was doing very well and seem to be progressing quite well.  Today she had a lot more pain in the left hip.  She reports it has been uncomfortable over the course of the week but she has been trying to be very compliant and active with her rehab exercises.  Today it was much more painful and much more painful with movement.  Patient tried to rest a little bit and when she tried to get back up she just had an overwhelming sense of weakness and lethargy.  She was being checked by her nurse and her son-in-law who is visiting.  That time they were concerned for the changes and sent the patient to emergency department for evaluation.  She reports at the time she just felt really strange and weak.  She did not have specifically chest pain or shortness of breath.  Reports has been having a little bit of urgency with urination but also has history of interstitial cystitis.    Home Medications Prior to Admission medications   Medication Sig Start Date End Date Taking? Authorizing Provider  acetaminophen (TYLENOL) 325 MG tablet Take 2 tablets (650 mg total) by mouth every 6 (six) hours as needed for mild pain or moderate pain (pain score 1-3 or temp > 100.5). 11/26/21   Eugenie Filler, MD  albuterol (VENTOLIN HFA) 108 (90 Base) MCG/ACT inhaler INHALE 1-2 PUFFS INTO THE LUNGS EVERY 6 (SIX) HOURS AS NEEDED FOR WHEEZING OR SHORTNESS OF BREATH. Patient taking differently: Inhale 2 puffs into the lungs 4 (four) times daily as needed for wheezing or shortness of breath. 02/24/21   Mosie Lukes, MD  aspirin  (ASPIRIN CHILDRENS) 81 MG chewable tablet Chew 1 tablet (81 mg total) by mouth 2 (two) times daily with a meal. 11/22/21 01/06/22  Hill, Marciano Sequin, PA-C  Calcium Carbonate (CALCIUM 600 PO) Take 1 tablet by mouth daily.    [provider]  carvedilol (COREG) 25 MG tablet TAKE 1 TABLET TWICE DAILY WITH A MEAL Patient taking differently: Take 25 mg by mouth 2 (two) times daily with a meal. 02/01/21   Crenshaw, Denice Bors, MD  cetirizine (ZYRTEC) 10 MG tablet Take 10 mg by mouth at bedtime.    [provider]  Cholecalciferol (VITAMIN D3) 1000 units CAPS Take 1,000 Units by mouth daily.    [provider]  diltiazem (CARDIZEM CD) 120 MG 24 hr capsule TAKE 1 CAPSULE BY MOUTH EVERY DAY Patient taking differently: Take 120 mg by mouth at bedtime. 05/04/21   Lelon Perla, MD  docusate sodium (COLACE) 100 MG capsule Take 1 capsule (100 mg total) by mouth 2 (two) times daily. 11/26/21   Eugenie Filler, MD  donepezil (ARICEPT) 10 MG tablet TAKE 1 TABLET AT BEDTIME Patient taking differently: Take 10 mg by mouth at bedtime. 08/01/21   Rondel Jumbo, PA-C  famotidine (PEPCID) 20 MG tablet TAKE 1 TABLET TWICE DAILY Patient taking differently: Take 20 mg by mouth daily. 10/17/21   Mosie Lukes, MD  ferrous  sulfate 325 (65 FE) MG tablet Take 325 mg by mouth daily with breakfast.    [provider]  folic acid (FOLVITE) 1 MG tablet Take 1 tablet (1 mg total) by mouth daily. 01/19/20   Mosie Lukes, MD  gabapentin (NEURONTIN) 300 MG capsule Take 1 capsule (300 mg total) by mouth 2 (two) times daily. 02/14/21   Pieter Partridge, DO  HYDROcodone-acetaminophen (NORCO) 10-325 MG tablet Take 0.5 tablets by mouth every 4 (four) hours as needed for up to 7 days for severe pain. 11/22/21 11/29/21  Charlott Rakes, PA-C  hydrOXYzine (ATARAX) 25 MG tablet TAKE 0.5-1 TABLETS (12.5-25 MG TOTAL) BY MOUTH 2 (TWO) TIMES DAILY AS NEEDED. Patient taking differently: Take 25 mg by mouth in the  morning and at bedtime. 04/15/21   Mosie Lukes, MD  Multiple Vitamin (MULTIVITAMIN WITH MINERALS) TABS tablet Take 1 tablet by mouth daily.    [provider]  nystatin (MYCOSTATIN/NYSTOP) powder Apply topically 3 (three) times daily for 7 days. Apply to affected areas ie under right breast 11/26/21 12/03/21  Eugenie Filler, MD  omeprazole (PRILOSEC) 20 MG capsule Take 20 mg by mouth at bedtime.    [provider]  pentosan polysulfate (ELMIRON) 100 MG capsule Take 100 mg by mouth as directed. two at morning and two at night 02/07/12   Perkins, Alexzandrew L, PA-C  polyethylene glycol (MIRALAX / GLYCOLAX) 17 g packet Take 17 g by mouth daily as needed for mild constipation. 11/26/21   Eugenie Filler, MD  rosuvastatin (CRESTOR) 20 MG tablet TAKE 1 TABLET AT BEDTIME Patient taking differently: Take 20 mg by mouth at bedtime. 11/02/21   Lelon Perla, MD  venlafaxine XR (EFFEXOR-XR) 75 MG 24 hr capsule TAKE 3 CAPSULES EVERY DAY WITH BREAKFAST Patient taking differently: Take 225 mg by mouth daily. 08/31/21   Mosie Lukes, MD      Allergies    Dilaudid [hydromorphone hcl]    Review of Systems   Review of Systems  Physical Exam Updated Vital Signs BP 122/63   Pulse 77   Temp 98.2 F (36.8 C) (Oral)   Resp (!) 24   LMP 01/30/1994   SpO2 98%  Physical Exam Constitutional:      Comments: Alert and nontoxic.  Patient is clear mental status.  Well-nourished well-developed.  Good general physical condition.  HENT:     Head: Normocephalic and atraumatic.     Mouth/Throat:     Pharynx: Oropharynx is clear.  Eyes:     Extraocular Movements: Extraocular movements intact.  Cardiovascular:     Rate and Rhythm: Normal rate and regular rhythm.  Pulmonary:     Effort: Pulmonary effort is normal.     Breath sounds: Normal breath sounds.     Comments: Sounds are clear.  No respiratory distress.  Breath sounds are symmetric without any crackles at the  bases. Abdominal:     Comments: Mild suprapubic discomfort without guarding.  Abdomen otherwise nontender.  Musculoskeletal:     Comments: Patient has erythema and edema surrounding the occlusive dressing over her incision.  Skin is warm to the touch down the anterior thigh to above the knee.  There is blanching.  See attached image for edema and blanching.  Patient has extensive resolving ecchymosis and hematoma on the dependent aspect of the leg and behind the knee.  See attached images.  Calf is nontender.  Foot does not have swelling or edema.  Radial pulses 2+.  Foot is warm and dry.  Skin:    General: Skin is warm and dry.  Neurological:     General: No focal deficit present.     Mental Status: She is oriented to person, place, and time.     Motor: No weakness.     Coordination: Coordination normal.  Psychiatric:        Mood and Affect: Mood normal.         ED Results / Procedures / Treatments   Labs (all labs ordered are listed, but only abnormal results are displayed) Labs Reviewed  COMPREHENSIVE METABOLIC PANEL - Abnormal; Notable for the following components:      Result Value   Glucose, Bld 110 (*)    Calcium 8.8 (*)    Total Protein 6.0 (*)    Albumin 3.2 (*)    All other components within normal limits  CBC WITH DIFFERENTIAL/PLATELET - Abnormal; Notable for the following components:   WBC 10.8 (*)    RBC 3.65 (*)    Hemoglobin 10.5 (*)    HCT 34.1 (*)    Monocytes Absolute 1.2 (*)    Eosinophils Absolute 0.8 (*)    Abs Immature Granulocytes 0.18 (*)    All other components within normal limits  URINALYSIS, ROUTINE W REFLEX MICROSCOPIC - Abnormal; Notable for the following components:   Color, Urine STRAW (*)    Specific Gravity, Urine 1.004 (*)    All other components within normal limits  RESP PANEL BY RT-PCR (FLU A&B, COVID) ARPGX2  CULTURE, BLOOD (ROUTINE X 2)  CULTURE, BLOOD (ROUTINE X 2)  URINE CULTURE  LACTIC ACID, PLASMA  PROTIME-INR  APTT   LACTIC ACID, PLASMA  HEPARIN LEVEL (UNFRACTIONATED)  TROPONIN I (HIGH SENSITIVITY)  TROPONIN I (HIGH SENSITIVITY)    EKG EKG Interpretation  Date/Time:  Tuesday November 29 2021 15:59:43 EDT Ventricular Rate:  66 PR Interval:  162 QRS Duration: 80 QT Interval:  401 QTC Calculation: 421 R Axis:   -15 Text Interpretation: Sinus rhythm Borderline left axis deviation Low voltage, precordial leads no sig change from previous Confirmed by Charlesetta Shanks [phone]) on 11/29/2021 10:55:50 PM  Radiology CT Angio Chest PE W/Cm &/Or Wo Cm  Result Date: 11/29/2021 CLINICAL DATA:  Positive D-dimer.  Weakness.  Recent hip surgery. EXAM: CT ANGIOGRAPHY CHEST WITH CONTRAST TECHNIQUE: Multidetector CT imaging of the chest was performed using the standard protocol during bolus administration of intravenous contrast. Multiplanar CT image reconstructions and MIPs were obtained to evaluate the vascular anatomy. RADIATION DOSE REDUCTION: This exam was performed according to the departmental dose-optimization program which includes automated exposure control, adjustment of the mA and/or kV according to patient size and/or use of iterative reconstruction technique. CONTRAST:  27m OMNIPAQUE IOHEXOL 350 MG/ML SOLN COMPARISON:  CT chest abdomen and pelvis 06/07/2017 FINDINGS: Cardiovascular: There is adequate opacification of the pulmonary arteries. There are segmental and subsegmental right lower lobe pulmonary emboli. Heart and aorta are normal in size. No pericardial effusion. There are atherosclerotic calcifications of the aorta. Mediastinum/Nodes: No enlarged mediastinal, hilar, or axillary lymph nodes. Thyroid gland, trachea, and esophagus demonstrate no significant findings. Lungs/Pleura: There is some atelectasis in the right middle lobe. There is also some stable scarring in the right middle lobe. Fissural nodule measuring 3 mm in the right lower lobe is favored as intrapulmonary lymph node. This is unchanged  from 2019 and favored as benign. No pleural effusion or pneumothorax identified. Upper Abdomen: Cholecystectomy clips are present. Musculoskeletal: T12 vertebroplasty changes again noted.  Review of the MIP images confirms the above findings. IMPRESSION: 1. Right lower lobe segmental and subsegmental pulmonary emboli. Right heart strain present with RV to LV ratio 1.1. 2. Right middle lobe atelectasis and scarring. Aortic Atherosclerosis (ICD10-I70.0). Electronically Signed   By: Ronney Asters M.D.   On: 11/29/2021 20:48   VAS [place] LOWER EXTREMITY VENOUS (DVT) (7a-7p)  Result Date: 11/29/2021  Lower Venous DVT Study Patient Name:  Shannon [name]  Date of Exam:   11/29/2021 Medical Rec #: [id number]      Accession #:    [id number] Date of Birth: [birth year]       Patient Gender: F Patient Age:   63 years Exam Location:  [facility] Procedure:      VAS [place] LOWER EXTREMITY VENOUS (DVT) Referring Phys: [facility] Annikah Lovins --------------------------------------------------------------------------------  Indications: Swelling, and Pain.  Risk Factors: Surgery Left hip fracture with surgical repair (11/21/21). Limitations: Body habitus and poor ultrasound/tissue interface. Comparison Study: Previous exam on 09/21/20 was negative for DVT Performing Technologist: Rogelia Rohrer RVT, RDMS  Examination Guidelines: A complete evaluation includes B-mode imaging, spectral Doppler, color Doppler, and power Doppler as needed of all accessible portions of each vessel. Bilateral testing is considered an integral part of a complete examination. Limited examinations for reoccurring indications may be performed as noted. The reflux portion of the exam is performed with the patient in reverse Trendelenburg.  +-----+---------------+---------+-----------+----------+--------------+ RIGHTCompressibilityPhasicitySpontaneityPropertiesThrombus Aging +-----+---------------+---------+-----------+----------+--------------+ CFV  Full            Yes      Yes                                 +-----+---------------+---------+-----------+----------+--------------+   +---------+---------------+---------+-----------+----------+--------------+ LEFT     CompressibilityPhasicitySpontaneityPropertiesThrombus Aging +---------+---------------+---------+-----------+----------+--------------+ CFV      Full           Yes      Yes                                 +---------+---------------+---------+-----------+----------+--------------+ SFJ      Full                                                        +---------+---------------+---------+-----------+----------+--------------+ FV Prox  Full           Yes      Yes                                 +---------+---------------+---------+-----------+----------+--------------+ FV Mid   Full           Yes      Yes                                 +---------+---------------+---------+-----------+----------+--------------+ FV DistalFull           Yes      Yes                                 +---------+---------------+---------+-----------+----------+--------------+ PFV      Full                                                        +---------+---------------+---------+-----------+----------+--------------+  POP      Full           Yes      Yes                                 +---------+---------------+---------+-----------+----------+--------------+ PTV      Full                                                        +---------+---------------+---------+-----------+----------+--------------+ PERO     Full                                                        +---------+---------------+---------+-----------+----------+--------------+    Summary: RIGHT: - No evidence of common femoral vein obstruction.  LEFT: - There is no evidence of deep vein thrombosis in the lower extremity.  - No cystic structure found in the popliteal fossa.  *See table(s) above for  measurements and observations. Electronically signed by Harold Barban MD on 11/29/2021 at 8:45:35 PM.    Final    DG Hip Unilat W or Wo Pelvis 2-3 Views Left  Result Date: 11/29/2021 CLINICAL DATA:  Hip pain 1 week post replacement. Pain and swelling in left hip and leg. EXAM: DG HIP (WITH OR WITHOUT PELVIS) 2-3V LEFT COMPARISON:  Preoperative radiograph 11/21/2021 FINDINGS: Left hip arthroplasty in expected alignment. Cerclage wire fixation about the intertrochanteric femur. No periprosthetic lucency or fracture. Chronic changes about the pubic symphysis. Skin staples in place. Soft tissue edema laterally IMPRESSION: Left hip arthroplasty without complication. Electronically Signed   By: Keith Rake M.D.   On: 11/29/2021 18:39   DG Chest Port 1 View  Result Date: 11/29/2021 CLINICAL DATA:  Question sepsis EXAM: PORTABLE CHEST 1 VIEW COMPARISON:  Chest 11/21/2021 FINDINGS: Improved aeration right lung base.  Left lung clear. Heart size upper normal.  Vascularity normal.  No edema or effusion. IMPRESSION: Improved aeration right lung base. No acute abnormality. Electronically Signed   By: Franchot Gallo M.D.   On: 11/29/2021 16:37    Procedures Procedures   CRITICAL CARE Performed by: Charlesetta Shanks   Total critical care time: 45 minutes  Critical care time was exclusive of separately billable procedures and treating other patients.  Critical care was necessary to treat or prevent imminent or life-threatening deterioration.  Critical care was time spent personally by me on the following activities: development of treatment plan with patient and/or surrogate as well as nursing, discussions with consultants, evaluation of patient's response to treatment, examination of patient, obtaining history from patient or surrogate, ordering and performing treatments and interventions, ordering and review of laboratory studies, ordering and review of radiographic studies, pulse oximetry and  re-evaluation of patient's condition.  Medications Ordered in ED Medications  heparin ADULT infusion 100 units/mL (25000 units/212m) (1,100 Units/hr Intravenous New Bag/Given 11/29/21 2157)  0.9 %  sodium chloride infusion ( Intravenous New Bag/Given 11/29/21 2046)  iohexol (OMNIPAQUE) 350 MG/ML injection 75 mL (75 mLs Intravenous Contrast Given 11/29/21 2022)  heparin bolus via infusion 5,000 Units (5,000 Units Intravenous Bolus from Bag 11/29/21 2201)    ED  Course/ Medical Decision Making/ A&P                           Medical Decision Making Amount and/or Complexity of Data Reviewed Labs: ordered. Radiology: ordered. ECG/medicine tests: ordered.  Risk Prescription drug management. Decision regarding hospitalization.   Patient is just over 1 week postoperative left hip arthroplasty.  She reports she was doing well postoperatively but today had increased pain in the hip and an episode of extreme generalized weakness without any associated pain other than the hip.  Proceed with diagnostic evaluation including ACS\sepsis\DVT\PE.  At this time patient is normal systolic blood pressure, no fever and regular heart rate.  By vital signs does not meet sepsis criteria.  We will proceed with evaluation with CBC, metabolic panel, urinalysis and lactic acid and continue to monitor.  Troponin normal, white count 10.8, urinalysis negative, COVID and influenza testing negative.  DVT study reported by radiology negative for DVT.Marland Kitchen  With patient's symptoms of near syncope lethargy, recent surgery lower extremity swelling, will proceed with PE study.  PE study interpreted by radiology positive for segmental and subsegmental pulmonary embolus.  Also personally reviewed and visualized the study.  I agree with findings.  Consult: Reviewed with Dr. Kathaleen Bury, orthopedics.  We reviewed the physical exam with edema, erythema and pain that significantly worsened over the past day.  At this time, Dr. Cornelius Moras advises low suspicion for infectious etiology of the joint or cellulitis.  Likely other etiology for patient's symptoms.  Patient has remained alert and appropriate.  Vital signs have remained stable.  PE study is positive.  This is likely explanation for patient's near syncope and lethargy episode.  Will admit with heparin.  [facility] hospitalist consult for admission with Dr. Darnell Level.          Final Clinical Impression(s) / ED Diagnoses Final diagnoses:  Acute pulmonary embolism, unspecified pulmonary embolism type, unspecified whether acute cor pulmonale present [facility])    Rx / DC Orders ED Discharge Orders     None         Charlesetta Shanks, MD 12/02/21 [phone]

## 2021-11-29 NOTE — Progress Notes (Signed)
LLE venous duplex has been completed.  Preliminary results messaged to Dr. Johnney Killian.   Results can be found under chart review under CV PROC. 11/29/2021 5:15 PM Rosalinda Seaman RVT, RDMS

## 2021-11-29 NOTE — H&P (Incomplete)
Shannon [name] JQB:[id number] DOB: 11/21/1945 DOA: 11/29/2021   PCP: Shannon Lukes, MD   Outpatient Specialists:   CARDS: Dr. Stanford [name]    Oncology Shannon [name], Shannon Massed, NP   Orthopedics Dr. Lyla [name] Patient arrived to ER on 11/29/21 at 1444 Referred by Attending Shannon Shanks, MD   Patient coming from:     From [facility]  Chief Complaint:   Chief Complaint  Patient presents with  . Weakness  . Post-op Problem  . Fatigue    HPI: Shannon [name] is a 76 y.o. female with medical history significant of     Left hip fracture, chronic diastolic CHF  HTN , dementia, HLD, GERD, anemia, breast cancer diagnosed in 2014, sleep apnea history of melanoma congestive dilated cardiomyopathy constipation osteoporosis prediabetes  Presented with  dizziness Patient brought in from [facility] rehab complaint of generalized weakness and fatigue as well as pain and swelling of her left leg patient had a hip surgery done only 10 days ago.  At baseline she was able to walk but no longer able to do so.  Does not smoke or drink     Initial COVID TEST  NEGATIVE    Lab Results  Component Value Date   SARSCOV2NAA NEGATIVE 11/29/2021   [place] NEGATIVE 07/12/2020     Regarding pertinent Chronic problems:    Hyperlipidemia - on statins Crestor Lipid Panel     Component Value Date/Time   CHOL 167 06/23/2021 0924   TRIG 236.0 (H) 06/23/2021 0924   HDL 49.90 06/23/2021 0924   CHOLHDL 3 06/23/2021 0924   VLDL 47.2 (H) 06/23/2021 0924   LDLCALC 80 06/05/2018 1215   LDLDIRECT 89.0 06/23/2021 0924     HTN on Coreg, diltiazem   chronic CHF diastolic - last echo 4097 EF 35-32% grade 1 diastolic dysfunction   insulin, PO meds only, diet controlled  obesity-   BMI Readings from Last 1 Encounters:  11/25/21 32.42 kg/m     *** Asthma -well *** controlled on home inhalers/ nebs f                        ***last no prior***admission  ***                        No ***history of intubation  *** COPD - not **followed by pulmonology *** not  on baseline oxygen  *L,      OSA -on nocturnal oxygen, *CPAP, *noncompliant with CPAP  Dementia - on Aricept     Chronic anemia - baseline hg Hemoglobin & Hematocrit  Recent Labs    11/25/21 0326 11/26/21 0344 11/29/21 1543  HGB 9.6* 11.0* 10.5*     While in ER:   CTA showing PE started on heparin    HIP Left hip arthroplasty without complication.    CXR - Improved aeration right lung base. No acute abnormality.     CTA chest - Right lower lobe segmental and subsegmental pulmonary emboli. Right heart strain present with RV to LV ratio 1.1.  Right middle lobe atelectasis and scarring.  Following Medications were ordered in ER: Medications  heparin ADULT infusion 100 units/mL (25000 units/234m) (1,100 Units/hr Intravenous New Bag/Given 11/29/21 2157)  0.9 %  sodium chloride infusion ( Intravenous New Bag/Given 11/29/21 2046)  iohexol (OMNIPAQUE) 350 MG/ML injection 75 mL (75 mLs Intravenous Contrast Given 11/29/21 2022)  heparin bolus via infusion 5,000 Units (5,000 Units  Intravenous Bolus from Bag 11/29/21 2201)    _______________________________________________________ ER Provider Called:  Orthopedics   Dr. Kathaleen [name] They Recommend admit to medicine       ED Triage Vitals  Enc Vitals Group     BP 11/29/21 1503 (!) 144/59     Pulse Rate 11/29/21 1503 66     Resp 11/29/21 1503 18     Temp 11/29/21 1503 98.3 F (36.8 C)     Temp Source 11/29/21 1503 Oral     SpO2 11/29/21 1503 98 %     Weight --      Height --      Head Circumference --      Peak Flow --      Pain Score 11/29/21 1504 2     Pain Loc --      Pain Edu? --      Excl. in [place]? --   TMAX(24)@     _________________________________________ Significant initial  Findings: Abnormal Labs Reviewed  COMPREHENSIVE METABOLIC PANEL - Abnormal; Notable for the following components:      Result Value   Glucose, Bld 110 (*)     Calcium 8.8 (*)    Total Protein 6.0 (*)    Albumin 3.2 (*)    All other components within normal limits  CBC WITH DIFFERENTIAL/PLATELET - Abnormal; Notable for the following components:   WBC 10.8 (*)    RBC 3.65 (*)    Hemoglobin 10.5 (*)    HCT 34.1 (*)    Monocytes Absolute 1.2 (*)    Eosinophils Absolute 0.8 (*)    Abs Immature Granulocytes 0.18 (*)    All other components within normal limits  URINALYSIS, ROUTINE W REFLEX MICROSCOPIC - Abnormal; Notable for the following components:   Color, Urine STRAW (*)    Specific Gravity, Urine 1.004 (*)    All other components within normal limits     _________________________ Troponin 4 ECG: Ordered Personally reviewed and interpreted by me showing: HR : 66 Rhythm: Sinus rhythm Borderline left axis deviation Low voltage, precordial leads QTC 421   WBC     Component Value Date/Time   WBC 10.8 (H) 11/29/2021 1543   LYMPHSABS 1.6 11/29/2021 1543   LYMPHSABS 1.4 08/07/2016 1029   MONOABS 1.2 (H) 11/29/2021 1543   MONOABS 0.5 08/07/2016 1029   EOSABS 0.8 (H) 11/29/2021 1543   EOSABS 0.5 08/07/2016 1029   BASOSABS 0.1 11/29/2021 1543   BASOSABS 0.0 08/07/2016 1029     Lactic Acid, Venous    Component Value Date/Time   LATICACIDVEN 1.1 11/29/2021 1543      UA   no evidence of UTI      Urine analysis:    Component Value Date/Time   COLORURINE STRAW (A) 11/29/2021 1719   APPEARANCEUR CLEAR 11/29/2021 1719   LABSPEC 1.004 (L) 11/29/2021 1719   PHURINE 6.0 11/29/2021 1719   GLUCOSEU NEGATIVE 11/29/2021 1719   GLUCOSEU NEGATIVE 07/07/2020 1558   HGBUR NEGATIVE 11/29/2021 1719   BILIRUBINUR NEGATIVE 11/29/2021 1719   BILIRUBINUR negative 06/21/2021 [place] 11/29/2021 1719   PROTEINUR NEGATIVE 11/29/2021 1719   UROBILINOGEN 0.2 06/21/2021 1646   UROBILINOGEN 0.2 07/07/2020 1558   NITRITE NEGATIVE 11/29/2021 1719   LEUKOCYTESUR NEGATIVE 11/29/2021 1719    Results for orders placed or  performed during the hospital encounter of 11/29/21  Resp Panel by RT-PCR (Flu A&B, Covid)     Status: None   Collection Time: 11/29/21  4:07 PM   Specimen:  Nasal Swab  Result Value Ref Range Status   SARS Coronavirus 2 by RT PCR NEGATIVE NEGATIVE Final         Influenza A by PCR NEGATIVE NEGATIVE Final   Influenza B by PCR NEGATIVE NEGATIVE Final         *Note: Due to a large number of results and/or encounters for the requested time period, some results have not been displayed. A complete set of results can be found in Results Review.     _______________________________________________ Hospitalist was called for admission for pulmonary embolus   The following Work up has been ordered so far:  Orders Placed This Encounter  Procedures  . Blood Culture (routine x 2)  . Urine Culture  . Resp Panel by RT-PCR (Flu A&B, Covid) Anterior Nasal Swab  . DG Chest Port 1 View  . DG Hip Unilat W or Wo Pelvis 2-3 Views Left  . CT Angio Chest PE W/Cm &/Or Wo Cm  . Lactic acid, plasma  . Comprehensive metabolic panel  . CBC with Differential  . Protime-INR  . APTT  . Urinalysis, Routine w reflex microscopic  . Heparin level (unfractionated)  . Diet NPO time specified  . Cardiac monitoring  . Document height and weight  . Assess and Document Glasgow Coma Scale  . Document vital signs within 1-hour of fluid bolus completion.  Notify provider of abnormal vital signs despite fluid resuscitation.  . Refer to Sidebar Report: Sepsis Bundle ED/IP  . Notify provider for difficulties obtaining IV access  . Initiate Carrier Fluid Protocol  . Consult to orthopedic surgery  Dr. Lyla [name] patient  . heparin per pharmacy consult  . Consult to hospitalist  . Pulse oximetry, continuous  . ED EKG 12-Lead  . EKG 12-Lead  . Insert peripheral IV X 1  . VAS [place] LOWER EXTREMITY VENOUS (DVT) (7a-7p)     OTHER Significant initial  Findings:  labs showing:    Recent Labs  Lab 11/23/21 0800  11/24/21 0321 11/25/21 0326 11/26/21 0344 11/29/21 1543  NA 142 140 140 142 137  K 4.1 4.0 3.9 3.9 4.0  CO2 '26 26 27 31 27  '$ GLUCOSE 111* 95 114* 105* 110*  BUN '21 20 18 22 18  '$ CREATININE 0.88 0.86 0.69 0.66 0.94  CALCIUM 8.4* 8.3* 8.5* 9.0 8.8*  MG 2.1  --   --  2.0  --     Cr  stable,   Lab Results  Component Value Date   CREATININE 0.94 11/29/2021   CREATININE 0.66 11/26/2021   CREATININE 0.69 11/25/2021    Recent Labs  Lab 11/23/21 0800 11/29/21 1543  AST 72* 39  ALT 21 30  ALKPHOS 66 75  BILITOT 0.5 0.7  PROT 5.7* 6.0*  ALBUMIN 3.3* 3.2*   Lab Results  Component Value Date   CALCIUM 8.8 (L) 11/29/2021   PHOS 3.5 10/10/2013    Plt: Lab Results  Component Value Date   PLT 303 11/29/2021    COVID-19 Labs  No results for input(s): "DDIMER", "FERRITIN", "LDH", "CRP" in the last 72 hours.  Lab Results  Component Value Date   SARSCOV2NAA NEGATIVE 11/29/2021   [place] NEGATIVE 07/12/2020    Recent Labs  Lab 11/23/21 0800 11/23/21 1355 11/24/21 0321 11/24/21 1544 11/25/21 0326 11/26/21 0344 11/29/21 1543  WBC 10.6* 12.1* 9.2  --  9.1 8.8 10.8*  NEUTROABS 8.2*  --  6.3  --  6.5  --  6.9  HGB 7.8* 7.7* 6.7* 10.7* 9.6* 11.0*  10.5*  HCT 25.2* 24.5* 21.3* 33.4* 29.7* 34.0* 34.1*  MCV 94.4 93.9 93.8  --  90.5 90.4 93.4  PLT 165 197 161  --  180 199 303    HG/HCT  stable,      Component Value Date/Time   HGB 10.5 (L) 11/29/2021 1543   HGB 11.9 08/07/2016 1029   HCT 34.1 (L) 11/29/2021 1543   HCT 37.7 08/07/2016 1029   MCV 93.4 11/29/2021 1543   MCV 90.8 08/07/2016 1029     Cardiac Panel (last 3 results) No results for input(s): "CKTOTAL", "CKMB", "TROPONINI", "RELINDX" in the last 72 hours.  .car BNP (last 3 results) No results for input(s): "BNP" in the last 8760 hours.    DM  labs:  HbA1C: Recent Labs    01/18/21 1609 06/23/21 0924  HGBA1C 5.6 5.6       CBG (last 3)  No results for input(s): "GLUCAP" in the last 72 hours.         Cultures:    Component Value Date/Time   SDES  02/18/2020 1504    URINE, RANDOM Performed at [facility], [address] Madelaine Bhat [place], [place] [address]    SPECREQUEST  02/18/2020 1504    NONE Performed at [facility], [address] [place], [place] [address]    CULT (A) 02/18/2020 1504    <10,000 COLONIES/mL INSIGNIFICANT GROWTH Performed at [facility] [address] [place], [place] [address]    REPTSTATUS 02/20/2020 FINAL 02/18/2020 1504     Radiological Exams on Admission: CT Angio Chest PE W/Cm &/Or Wo Cm  Result Date: 11/29/2021 CLINICAL DATA:  Positive D-dimer.  Weakness.  Recent hip surgery. EXAM: CT ANGIOGRAPHY CHEST WITH CONTRAST TECHNIQUE: Multidetector CT imaging of the chest was performed using the standard protocol during bolus administration of intravenous contrast. Multiplanar CT image reconstructions and MIPs were obtained to evaluate the vascular anatomy. RADIATION DOSE REDUCTION: This exam was performed according to the departmental dose-optimization program which includes automated exposure control, adjustment of the mA and/or kV according to patient size and/or use of iterative reconstruction technique. CONTRAST:  78m OMNIPAQUE IOHEXOL 350 MG/ML SOLN COMPARISON:  CT chest abdomen and pelvis 06/07/2017 FINDINGS: Cardiovascular: There is adequate opacification of the pulmonary arteries. There are segmental and subsegmental right lower lobe pulmonary emboli. Heart and aorta are normal in size. No pericardial effusion. There are atherosclerotic calcifications of the aorta. Mediastinum/Nodes: No enlarged mediastinal, hilar, or axillary lymph nodes. Thyroid gland, trachea, and esophagus demonstrate no significant findings. Lungs/Pleura: There is some atelectasis in the right middle lobe. There is also some stable scarring in the right middle lobe. Fissural nodule measuring 3 mm in the right lower lobe is favored as intrapulmonary lymph  node. This is unchanged from 2019 and favored as benign. No pleural effusion or pneumothorax identified. Upper Abdomen: Cholecystectomy clips are present. Musculoskeletal: T12 vertebroplasty changes again noted. Review of the MIP images confirms the above findings. IMPRESSION: 1. Right lower lobe segmental and subsegmental pulmonary emboli. Right heart strain present with RV to LV ratio 1.1. 2. Right middle lobe atelectasis and scarring. Aortic Atherosclerosis (ICD10-I70.0). Electronically Signed   By: ARonney AstersM.D.   On: 11/29/2021 20:48   VAS [place]OWER EXTREMITY VENOUS (DVT) (7a-7p)  Result Date: 11/29/2021  Lower Venous DVT Study Patient Name:  Shannon [name] Date of Exam:   11/29/2021 Medical Rec #: [id number]     Accession #:  [id number] Date of Birth: [birth year]       Patient Gender: F Patient Age:   76 years Exam Location:  [facility] Procedure:      VAS [place] LOWER EXTREMITY VENOUS (DVT) Referring Phys: [facility] PFEIFFER --------------------------------------------------------------------------------  Indications: Swelling, and Pain.  Risk Factors: Surgery Left hip fracture with surgical repair (11/21/21). Limitations: Body habitus and poor ultrasound/tissue interface. Comparison Study: Previous exam on 09/21/20 was negative for DVT Performing Technologist: Rogelia Rohrer RVT, RDMS  Examination Guidelines: A complete evaluation includes B-mode imaging, spectral Doppler, color Doppler, and power Doppler as needed of all accessible portions of each vessel. Bilateral testing is considered an integral part of a complete examination. Limited examinations for reoccurring indications may be performed as noted. The reflux portion of the exam is performed with the patient in reverse Trendelenburg.  +-----+---------------+---------+-----------+----------+--------------+ RIGHTCompressibilityPhasicitySpontaneityPropertiesThrombus Aging +-----+---------------+---------+-----------+----------+--------------+  CFV  Full           Yes      Yes                                 +-----+---------------+---------+-----------+----------+--------------+   +---------+---------------+---------+-----------+----------+--------------+ LEFT     CompressibilityPhasicitySpontaneityPropertiesThrombus Aging +---------+---------------+---------+-----------+----------+--------------+ CFV      Full           Yes      Yes                                 +---------+---------------+---------+-----------+----------+--------------+ SFJ      Full                                                        +---------+---------------+---------+-----------+----------+--------------+ FV Prox  Full           Yes      Yes                                 +---------+---------------+---------+-----------+----------+--------------+ FV Mid   Full           Yes      Yes                                 +---------+---------------+---------+-----------+----------+--------------+ FV DistalFull           Yes      Yes                                 +---------+---------------+---------+-----------+----------+--------------+ PFV      Full                                                        +---------+---------------+---------+-----------+----------+--------------+ POP      Full           Yes      Yes                                 +---------+---------------+---------+-----------+----------+--------------+  PTV      Full                                                        +---------+---------------+---------+-----------+----------+--------------+ PERO     Full                                                        +---------+---------------+---------+-----------+----------+--------------+    Summary: RIGHT: - No evidence of common femoral vein obstruction.  LEFT: - There is no evidence of deep vein thrombosis in the lower extremity.  - No cystic structure found in the popliteal fossa.  *See  table(s) above for measurements and observations. Electronically signed by Harold Barban MD on 11/29/2021 at 8:45:35 PM.    Final    DG Hip Unilat W or Wo Pelvis 2-3 Views Left  Result Date: 11/29/2021 CLINICAL DATA:  Hip pain 1 week post replacement. Pain and swelling in left hip and leg. EXAM: DG HIP (WITH OR WITHOUT PELVIS) 2-3V LEFT COMPARISON:  Preoperative radiograph 11/21/2021 FINDINGS: Left hip arthroplasty in expected alignment. Cerclage wire fixation about the intertrochanteric femur. No periprosthetic lucency or fracture. Chronic changes about the pubic symphysis. Skin staples in place. Soft tissue edema laterally IMPRESSION: Left hip arthroplasty without complication. Electronically Signed   By: Keith Rake M.D.   On: 11/29/2021 18:39   DG Chest Port 1 View  Result Date: 11/29/2021 CLINICAL DATA:  Question sepsis EXAM: PORTABLE CHEST 1 VIEW COMPARISON:  Chest 11/21/2021 FINDINGS: Improved aeration right lung base.  Left lung clear. Heart size upper normal.  Vascularity normal.  No edema or effusion. IMPRESSION: Improved aeration right lung base. No acute abnormality. Electronically Signed   By: Franchot Gallo M.D.   On: 11/29/2021 16:37   _______________________________________________________________________________________________________ Latest  Blood pressure 113/64, pulse 73, temperature 98.2 F (36.8 C), temperature source Oral, resp. rate 20, last menstrual period 01/30/1994, SpO2 95 %.   Vitals  labs and radiology finding personally reviewed  Review of Systems:    Pertinent positives include:  fatigue, left leg swelling  Constitutional:  No weight loss, night sweats, Fevers, chills, weight loss  HEENT:  No headaches, Difficulty swallowing,Tooth/dental problems,Sore throat,  No sneezing, itching, ear ache, nasal congestion, post nasal drip,  Cardio-vascular:  No chest pain, Orthopnea, PND, anasarca, dizziness, palpitations.no Bilateral lower extremity swelling  GI:   No heartburn, indigestion, abdominal pain, nausea, vomiting, diarrhea, change in bowel habits, loss of appetite, melena, blood in stool, hematemesis Resp:  no shortness of breath at rest. No dyspnea on exertion, No excess mucus, no productive cough, No non-productive cough, No coughing up of blood.No change in color of mucus.No wheezing. Skin:  no rash or lesions. No jaundice GU:  no dysuria, change in color of urine, no urgency or frequency. No straining to urinate.  No flank pain.  Musculoskeletal:  No joint pain or no joint swelling. No decreased range of motion. No back pain.  Psych:  No change in mood or affect. No depression or anxiety. No memory loss.  Neuro: no localizing neurological complaints, no tingling, no weakness, no double vision, no gait abnormality, no slurred speech, no confusion  All  systems reviewed and apart from [place] all are negative _______________________________________________________________________________________________ Past Medical History:   Past Medical History:  Diagnosis Date  . Abnormal nuclear stress test 12/16/2013  . Anemia 08/28/2011  . Asthma    as a child  . Breast cancer of lower-inner quadrant of right female breast 2011  . Carpal tunnel syndrome of left wrist   . Cataracts, bilateral 12/28/2015  . Chest pain 02/27/2016  . Closed fracture of maxilla 10/17/2017  . Congestive dilated cardiomyopathy 12/30/2012  . Constipation 03/03/2012  . 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 Dermatitis   . Dyspnea on exertion 02/10/2009  . Essential hypertension 08/11/2008   Well controlled, no changes to meds. Encouraged heart healthy diet such as the DASH diet  . Facial fracture   . 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 GERD (gastroesophageal reflux disease) 08/11/2008  . Grade I diastolic dysfunction 40/81/4481  . History of blood transfusion   . History of chicken pox   . History of hiatal hernia   . History of measles   .  History of melanoma 2008  . History of mumps   . Hyperlipidemia, mixed 08/11/2008  . Hyponatremia 08/28/2011  . 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 Interstitial cystitis   . Left knee pain 12/23/2017  . Left-sided back pain 03/06/2016  . Lipoma 02/10/2009  . Major depressive disorder with anxious distress 08/11/2008  . Mild neurocognitive disorder due to Alzheimer's disease 12/01/2020  . [name] lesion 06/21/2015  . Neuropathy   . NICM (nonischemic cardiomyopathy) 03/08/2010   likely 2/2 chemotx - a. Echo 2012: EF 45-50%;  b. Lex MV 2/12:  low risk, apical defect (small area of ischemia vs shifting breast atten);  c.  Echo 7/12: Normal wall thickness, EF 60-65%, normal wall motion, grade 1 diastolic dysfunction, mild LAE, PASP 32;   d. Lex MV 11/13:  EF 76%, no ischemia  . OA (osteoarthritis) 09/07/2011  . Obesity 09/28/2014  . Osteoporosis 03/07/2011   DEXA T score -2.6 AP spine 03/07/11    Formatting of this note might be different from the original. Formatting of this note might be different from the original. DEXA T score -2.6 AP spine 03/07/11   Last Assessment & Plan:  Formatting of this note might be different from the original. Encouraged to get adequate exercise, calcium and vitamin d intake  . Pain in joint, ankle and foot 09/28/2014  . Palpitation 10/28/2020  . Personal history of chemotherapy 2012  . Personal history of radiation therapy 2012  . Prediabetes 06/15/2009  . Recurrent falls 12/28/2015  . Right knee pain 07/10/2020  . Shortness of breath   . UTI (urinary tract infection) 03/03/2012  . Vertebral fracture, osteoporotic, sequela 04/05/2016  . Vitamin D deficiency 06/14/2017   Supplement and monitor    Past Surgical History:  Procedure Laterality Date  . BREAST LUMPECTOMY  04/2009   RIGHT FOR BREAST CANCER-CHEMO/RADIATION X 1 YEAR  . BREAST REDUCTION SURGERY Bilateral 05/04/2014   Procedure: MAMMARY REDUCTION  (BREAST);  Surgeon: Cristine Polio, MD;   Location: [facility];  Service: Plastics;  Laterality: Bilateral;  . CARPAL TUNNEL RELEASE     L wrist, ulnar nerve moved  . CHOLECYSTECTOMY    . CRANIOTOMY FOR TUMOR  2000  . ELBOW SURGERY     left  . KNEE ARTHROSCOPY Left 10/12/2014   Procedure: LEFT KNEE ARTHROSCOPY ;  Surgeon: Gaynelle Arabian, MD;  Location: [facility];  Service: Orthopedics;  Laterality:  Left;  . KYPHOPLASTY N/A 03/30/2016   Procedure: THORACIC 12 KYPHOPLASTY;  Surgeon: Phylliss Bob, MD;  Location: [facility];  Service: Orthopedics;  Laterality: N/A;  THORACIC 12 KYPHOPLASTY  . LEFT HEART CATHETERIZATION WITH CORONARY ANGIOGRAM N/A 12/16/2013   Procedure: LEFT HEART CATHETERIZATION WITH CORONARY ANGIOGRAM;  Surgeon: Peter M [name], MD;  Location: [facility] CATH LAB;  Service: Cardiovascular;  Laterality: N/A;  . LIPOMA EXCISION  03/28/2009   right leg  . MELANOMA EXCISION    . PORT-A-CATH REMOVAL  11/30/2010   [place]  . porta cath    . PORTACATH PLACEMENT  may 2011  . REDUCTION MAMMAPLASTY Bilateral   . SYNOVECTOMY Left 10/12/2014   Procedure: WITH SYNOVECTOMY;  Surgeon: Gaynelle Arabian, MD;  Location: [facility];  Service: Orthopedics;  Laterality: Left;  . TONSILLECTOMY  1958  . TOTAL HIP ARTHROPLASTY Left 11/21/2021   Procedure: TOTAL HIP ARTHROPLASTY ANTERIOR APPROACH;  Surgeon: Rod Can, MD;  Location: [facility];  Service: Orthopedics;  Laterality: Left;  . TOTAL KNEE ARTHROPLASTY  02/05/2012   Procedure: TOTAL KNEE ARTHROPLASTY;  Surgeon: Gearlean Alf, MD;  Location: [facility];  Service: Orthopedics;  Laterality: Right;  . TOTAL KNEE ARTHROPLASTY Left 02/10/2013   Procedure: LEFT TOTAL KNEE ARTHROPLASTY;  Surgeon: Gearlean Alf, MD;  Location: [facility];  Service: Orthopedics;  Laterality: Left;  . total knee raplacement  01-2012   Right Knee  . TOTAL KNEE REVISION Left 07/14/2020   Procedure: TOTAL KNEE REVISION;  Surgeon: Gaynelle Arabian, MD;  Location: [facility];  Service: Orthopedics;  Laterality: Left;  . TUBAL  LIGATION  1997    Social History:  Ambulatory   walker       reports that she has never smoked. She has never been exposed to tobacco smoke. She has never used smokeless tobacco. She reports that she does not drink alcohol and does not use drugs.   Family History:  Family History  Problem Relation Age of Onset  . Breast cancer Mother        sarcoma  . Lung cancer Mother   . Hypertension Mother   . Prostate cancer Father   . Congestive Heart Failure Father   . Heart attack Father   . Prostate cancer Brother   . Heart disease Maternal Grandfather        MI  . Down syndrome Son   . CVA Son   . Stomach cancer Maternal Aunt   . Dementia Maternal Aunt   . Uterine cancer Maternal Aunt   . Colon cancer Neg Hx    ______________________________________________________________________________________________ Allergies: Allergies  Allergen Reactions  . Dilaudid [Hydromorphone Hcl] Itching and Other (See Comments)    "Went Crazy"      Prior to Admission medications   Medication Sig Start Date End Date Taking? Authorizing Provider  acetaminophen (TYLENOL) 325 MG tablet Take 2 tablets (650 mg total) by mouth every 6 (six) hours as needed for mild pain or moderate pain (pain score 1-3 or temp > 100.5). 11/26/21   Eugenie Filler, MD  albuterol (VENTOLIN HFA) 108 (90 Base) MCG/ACT inhaler INHALE 1-2 PUFFS INTO THE LUNGS EVERY 6 (SIX) HOURS AS NEEDED FOR WHEEZING OR SHORTNESS OF BREATH. Patient taking differently: Inhale 2 puffs into the lungs 4 (four) times daily as needed for wheezing or shortness of breath. 02/24/21   Shannon Lukes, MD  aspirin (ASPIRIN CHILDRENS) 81 MG chewable tablet Chew 1 tablet (81 mg total) by mouth 2 (two) times daily with a meal.  11/22/21 01/06/22  Charlott Rakes, PA-C  Calcium Carbonate (CALCIUM 600 PO) Take 1 tablet by mouth daily.    [provider]  carvedilol (COREG) 25 MG tablet TAKE 1 TABLET TWICE DAILY WITH A MEAL Patient taking differently:  Take 25 mg by mouth 2 (two) times daily with a meal. 02/01/21   Crenshaw, Denice Bors, MD  cetirizine (ZYRTEC) 10 MG tablet Take 10 mg by mouth at bedtime.    [provider]  Cholecalciferol (VITAMIN D3) 1000 units CAPS Take 1,000 Units by mouth daily.    [provider]  diltiazem (CARDIZEM CD) 120 MG 24 hr capsule TAKE 1 CAPSULE BY MOUTH EVERY DAY Patient taking differently: Take 120 mg by mouth at bedtime. 05/04/21   Lelon Perla, MD  docusate sodium (COLACE) 100 MG capsule Take 1 capsule (100 mg total) by mouth 2 (two) times daily. 11/26/21   Eugenie Filler, MD  donepezil (ARICEPT) 10 MG tablet TAKE 1 TABLET AT BEDTIME Patient taking differently: Take 10 mg by mouth at bedtime. 08/01/21   Rondel Jumbo, PA-C  famotidine (PEPCID) 20 MG tablet TAKE 1 TABLET TWICE DAILY Patient taking differently: Take 20 mg by mouth daily. 10/17/21   Shannon Lukes, MD  ferrous sulfate 325 (65 FE) MG tablet Take 325 mg by mouth daily with breakfast.    [provider]  folic acid (FOLVITE) 1 MG tablet Take 1 tablet (1 mg total) by mouth daily. 01/19/20   Shannon Lukes, MD  gabapentin (NEURONTIN) 300 MG capsule Take 1 capsule (300 mg total) by mouth 2 (two) times daily. 02/14/21   Pieter Partridge, DO  HYDROcodone-acetaminophen (NORCO) 10-325 MG tablet Take 0.5 tablets by mouth every 4 (four) hours as needed for up to 7 days for severe pain. 11/22/21 11/29/21  Charlott Rakes, PA-C  hydrOXYzine (ATARAX) 25 MG tablet TAKE 0.5-1 TABLETS (12.5-25 MG TOTAL) BY MOUTH 2 (TWO) TIMES DAILY AS NEEDED. Patient taking differently: Take 25 mg by mouth in the morning and at bedtime. 04/15/21   Shannon Lukes, MD  Multiple Vitamin (MULTIVITAMIN WITH MINERALS) TABS tablet Take 1 tablet by mouth daily.    [provider]  nystatin (MYCOSTATIN/NYSTOP) powder Apply topically 3 (three) times daily for 7 days. Apply to affected areas ie under right breast 11/26/21 12/03/21  Eugenie Filler, MD   omeprazole (PRILOSEC) 20 MG capsule Take 20 mg by mouth at bedtime.    [provider]  pentosan polysulfate (ELMIRON) 100 MG capsule Take 100 mg by mouth as directed. two at morning and two at night 02/07/12   Perkins, Alexzandrew L, PA-C  polyethylene glycol (MIRALAX / GLYCOLAX) 17 g packet Take 17 g by mouth daily as needed for mild constipation. 11/26/21   Eugenie Filler, MD  rosuvastatin (CRESTOR) 20 MG tablet TAKE 1 TABLET AT BEDTIME Patient taking differently: Take 20 mg by mouth at bedtime. 11/02/21   Lelon Perla, MD  venlafaxine XR (EFFEXOR-XR) 75 MG 24 hr capsule TAKE 3 CAPSULES EVERY DAY WITH BREAKFAST Patient taking differently: Take 225 mg by mouth daily. 08/31/21   Shannon Lukes, MD    ___________________________________________________________________________________________________ Physical Exam:    11/29/2021   11:00 PM 11/29/2021    9:00 PM 11/29/2021    8:15 PM  Vitals with BMI  Systolic 096 283 662  Diastolic 64 63 70  Pulse 73 77 73    1. General:  in No  Acute distress   Chronically ill   -  appearing 2. Psychological: Alert and  Oriented 3. Head/ENT:    Dry Mucous Membranes                          Head Non traumatic, neck supple                           Poor Dentition 4. SKIN:  decreased Skin turgor,  Skin clean Dry and intact no rash 5. Heart: Regular rate and rhythm no*** Murmur, no Rub or gallop 6. Lungs:  , no wheezes or crackles   7. Abdomen: Soft, ***non-tender, Non distended *** obese ***bowel sounds present 8. Lower extremities: no clubbing, cyanosis,  left leg edema 9. Neurologically Grossly intact, moving all 4 extremities equally   10. MSK: Normal range of motion limited in left leg    Chart has been reviewed  ______________________________________________________________________________________________  Assessment/Plan  76 y.o. female with medical history significant of    Left hip fracture, chronic diastolic CHF< HTN ,  dementia, HLD, GERD, anemia   Admitted for  pulmonary embolism   Present on Admission: . Acute pulmonary embolism ([place]) . Chronic diastolic CHF (congestive heart failure) ([place]) . Essential hypertension . Alzheimer's dementia without behavioral disturbance ([place]) . Mixed hyperlipidemia . GERD without esophagitis . Postoperative anemia due to acute blood loss     Chronic diastolic CHF (congestive heart failure) ([facility]) Currently appears to be euvolemic we will continue to monitor fluid status  Essential hypertension Allow permissive hypertension for tonight  Alzheimer's dementia without behavioral disturbance ([facility]) Chronic stable monitor for any sign of sundowning  Mixed hyperlipidemia Chronic continue Crestor 20 mg p.o. daily  GERD without esophagitis Continue Pepcid 20 mg p.o. daily  Postoperative anemia due to acute blood loss Chronic stable continue to monitor CBC once on heparin  Acute pulmonary embolism ([place])  Admit to   Telemetry  Initiate heparin drip   Would likely benefit from case manager consult for long term anticoagulation Hold home blood pressure medications avoid hypotension Cycle cardiac enzymes Order echogram and lower extremity Dopplers  Most likely risk factors for hypercoagulable state being malignancy recent surgery      Other plan as per orders.  DVT prophylaxis: heparin   Code Status:    Code Status: Prior FULL CODE *** DNR/DNI ***comfort care as per patient ***family  I had personally discussed CODE STATUS with patient and family* I had spent *min discussing goals of care and CODE STATUS   Family Communication:   Family not at  Bedside  plan of care was discussed on the phone with *** Son, Daughter, Wife, Husband, Sister, Brother , father, mother  Disposition Plan:                              Back to current facility when stable                               Following barriers for discharge:                            Electrolytes  corrected                             PE treated  Will need consultants to evaluate patient prior to discharge                       Transition of care consulted                 Consults called: orthopedics are aware  Admission status:  ED Disposition     ED Disposition  Admit   Condition  --   [place]: [facility] [[id number]]  Level of Care: Telemetry [5]  Admit to tele based on following criteria: Other see comments  Comments: pe  May place patient in observation at [facility] or [facility] if equivalent level of care is available:: No  Covid Evaluation: Asymptomatic - no recent exposure (last 10 days) testing not required  Diagnosis: Pulmonary embolism [facility]) [[id number]]  Admitting Physician: Toy Baker [[id number]]  Attending Physician: Toy Baker [[id number]]           Obs    Level of care     tele  For 12H      Lab Results  Component Value Date   [place] 11/29/2021     Precautions: admitted as   Covid Negative      Carrell Palmatier 11/29/2021, 11:59 PM    [facility]     after 2 AM please page floor coverage PA If 7AM-7PM, please contact the day team taking care of the patient using [email or web address]   Patient was evaluated in the context of the global COVID-19 pandemic, which necessitated consideration that the patient might be at risk for infection with the SARS-CoV-2 virus that causes COVID-19. Institutional protocols and algorithms that pertain to the evaluation of patients at risk for COVID-19 are in a state of rapid change based on information released by regulatory bodies including the CDC and federal and state organizations. These policies and algorithms were followed during the patient's care.

## 2021-11-29 NOTE — Assessment & Plan Note (Signed)
Currently appears to be euvolemic we will continue to monitor fluid status

## 2021-11-29 NOTE — Subjective & Objective (Signed)
Patient brought in from [facility] rehab complaint of generalized weakness and fatigue as well as pain and swelling of her left leg patient had a hip surgery done only 10 days ago.  At baseline she was able to walk but no longer able to do so.

## 2021-11-29 NOTE — Assessment & Plan Note (Signed)
Chronic stable continue to monitor CBC once on heparin

## 2021-11-29 NOTE — H&P (Signed)
Shannon [name] AQT:[id number] DOB: [birth year] DOA: 11/29/2021   PCP: Mosie Lukes, MD   Outpatient Specialists:   CARDS: Dr. Stanford Breed    Oncology Delice Bison, Charlestine Massed, NP   Orthopedics Dr. Lyla Glassing Patient arrived to ER on 11/29/21 at 1444 Referred by Attending Charlesetta Shanks, MD   Patient coming from:     From [facility] [place] [facility]  Chief Complaint:   Chief Complaint  Patient presents with   Weakness   Post-op Problem   Fatigue    HPI: Shannon [name] is a 76 y.o. female with medical history significant of     Left hip fracture, chronic diastolic CHF  HTN , dementia, HLD, GERD, anemia, breast cancer diagnosed in 2014, sleep apnea history of melanoma congestive dilated cardiomyopathy constipation osteoporosis prediabetes  Presented with  dizziness Patient brought in from [facility] re[facility] complaint of generalized weakness and fatigue as well as pain and swelling of her left leg patient had a hip surgery done only 10 days ago.  At baseline she was able to walk but no longer able to do so.  Does not smoke or drink     Initial COVID TEST  NEGATIVE    Lab Results  Component Value Date   [place] NEGATIVE 11/29/2021   [place] NEGATIVE 07/12/2020     Regarding pertinent Chronic problems:    Hyperlipidemia - on statins Crestor Lipid Panel     Component Value Date/Time   CHOL 167 06/23/2021 0924   TRIG 236.0 (H) 06/23/2021 0924   HDL 49.90 06/23/2021 0924   CHOLHDL 3 06/23/2021 0924   VLDL 47.2 (H) 06/23/2021 0924   LDLCALC 80 06/05/2018 1215   LDLDIRECT 89.0 06/23/2021 0924     HTN on Coreg, diltiazem   chronic CHF diastolic - last echo 5625 EF 63-89% grade 1 diastolic dysfunction   insulin, PO meds only, diet controlled  obesity-   BMI Readings from Last 1 Encounters:  11/25/21 32.42 kg/m       Asthma -well   controlled on home inhalers/ nebs                     OSA -on nocturnal  CPAP,      Chronic anemia - baseline hg  Hemoglobin & Hematocrit  Recent Labs    11/25/21 0326 11/26/21 0344 11/29/21 1543  HGB 9.6* 11.0* 10.5*     While in ER:   CTA showing PE started on heparin    HIP Left hip arthroplasty without complication.    CXR - Improved aeration right lung base. No acute abnormality.     CTA chest - Right lower lobe segmental and subsegmental pulmonary emboli. Right heart strain present with RV to LV ratio 1.1.  Right middle lobe atelectasis and scarring.  Following Medications were ordered in ER: Medications  heparin ADULT infusion 100 units/mL (25000 units/259m) (1,100 Units/hr Intravenous New Bag/Given 11/29/21 2157)  0.9 %  sodium chloride infusion ( Intravenous New Bag/Given 11/29/21 2046)  iohexol (OMNIPAQUE) 350 MG/ML injection 75 mL (75 mLs Intravenous Contrast Given 11/29/21 2022)  heparin bolus via infusion 5,000 Units (5,000 Units Intravenous Bolus from Bag 11/29/21 2201)    _______________________________________________________ ER Provider Called:  Orthopedics   Dr. RKathaleen BuryThey Recommend admit to medicine      ED Triage Vitals  Enc Vitals Group     BP 11/29/21 1503 (!) 144/59     Pulse Rate 11/29/21 1503 66     Resp 11/29/21 1503  18     Temp 11/29/21 1503 98.3 F (36.8 C)     Temp Source 11/29/21 1503 Oral     SpO2 11/29/21 1503 98 %     Weight --      Height --      Head Circumference --      Peak Flow --      Pain Score 11/29/21 1504 2     Pain Loc --      Pain Edu? --      Excl. in [place]? --   TMAX(24)@     _________________________________________ Significant initial  Findings: Abnormal Labs Reviewed  COMPREHENSIVE METABOLIC PANEL - Abnormal; Notable for the following components:      Result Value   Glucose, Bld 110 (*)    Calcium 8.8 (*)    Total Protein 6.0 (*)    Albumin 3.2 (*)    All other components within normal limits  CBC WITH DIFFERENTIAL/PLATELET - Abnormal; Notable for the following components:   WBC 10.8 (*)    RBC 3.65 (*)     Hemoglobin 10.5 (*)    HCT 34.1 (*)    Monocytes Absolute 1.2 (*)    Eosinophils Absolute 0.8 (*)    Abs Immature Granulocytes 0.18 (*)    All other components within normal limits  URINALYSIS, ROUTINE W REFLEX MICROSCOPIC - Abnormal; Notable for the following components:   Color, Urine STRAW (*)    Specific Gravity, Urine 1.004 (*)    All other components within normal limits     _________________________ Troponin 4 ECG: Ordered Personally reviewed and interpreted by me showing: HR : 66 Rhythm: Sinus rhythm Borderline left axis deviation Low voltage, precordial leads QTC 421   WBC     Component Value Date/Time   WBC 10.8 (H) 11/29/2021 1543   LYMPHSABS 1.6 11/29/2021 1543   LYMPHSABS 1.4 08/07/2016 1029   MONOABS 1.2 (H) 11/29/2021 1543   MONOABS 0.5 08/07/2016 1029   EOSABS 0.8 (H) 11/29/2021 1543   EOSABS 0.5 08/07/2016 1029   BASOSABS 0.1 11/29/2021 1543   BASOSABS 0.0 08/07/2016 1029     Lactic Acid, Venous    Component Value Date/Time   LATICACIDVEN 1.1 11/29/2021 1543      UA   no evidence of UTI      Urine analysis:    Component Value Date/Time   COLORURINE STRAW (A) 11/29/2021 1719   APPEARANCEUR CLEAR 11/29/2021 1719   LABSPEC 1.004 (L) 11/29/2021 1719   PHURINE 6.0 11/29/2021 1719   GLUCOSEU NEGATIVE 11/29/2021 1719   GLUCOSEU NEGATIVE 07/07/2020 1558   HGBUR NEGATIVE 11/29/2021 1719   BILIRUBINUR NEGATIVE 11/29/2021 1719   BILIRUBINUR negative 06/21/2021 1646   KETONESUR NEGATIVE 11/29/2021 1719   PROTEINUR NEGATIVE 11/29/2021 1719   UROBILINOGEN 0.2 06/21/2021 1646   UROBILINOGEN 0.2 07/07/2020 1558   NITRITE NEGATIVE 11/29/2021 1719   LEUKOCYTESUR NEGATIVE 11/29/2021 1719    Results for orders placed or performed during the hospital encounter of 11/29/21  Resp Panel by RT-PCR (Flu A&B, Covid)     Status: None   Collection Time: 11/29/21  4:07 PM   Specimen: Nasal Swab  Result Value Ref Range Status   SARS Coronavirus 2 by RT PCR  NEGATIVE NEGATIVE Final         Influenza A by PCR NEGATIVE NEGATIVE Final   Influenza B by PCR NEGATIVE NEGATIVE Final         *Note: Due to a large number of results and/or encounters for  the requested time period, some results have not been displayed. A complete set of results can be found in Results Review.     _______________________________________________ Hospitalist was called for admission for pulmonary embolus   The following Work up has been ordered so far:  Orders Placed This Encounter  Procedures   Blood Culture (routine x 2)   Urine Culture   Resp Panel by RT-PCR (Flu A&B, Covid) Anterior Nasal Swab   DG Chest Port 1 View   DG Hip Unilat W or Wo Pelvis 2-3 Views Left   CT Angio Chest PE W/Cm &/Or Wo Cm   Lactic acid, plasma   Comprehensive metabolic panel   CBC with Differential   Protime-INR   APTT   Urinalysis, Routine w reflex microscopic   Heparin level (unfractionated)   Diet NPO time specified   Cardiac monitoring   Document height and weight   Assess and Document Glasgow Coma Scale   Document vital signs within 1-hour of fluid bolus completion.  Notify provider of abnormal vital signs despite fluid resuscitation.   Refer to Sidebar Report: Sepsis Bundle ED/IP   Notify provider for difficulties obtaining IV access   Initiate Carrier Fluid Protocol   Consult to orthopedic surgery  Dr. Lyla Glassing patient   heparin per pharmacy consult   Consult to hospitalist   Pulse oximetry, continuous   ED EKG 12-Lead   EKG 12-Lead   Insert peripheral IV X 1   VAS [place] LOWER EXTREMITY VENOUS (DVT) (7a-7p)     OTHER Significant initial  Findings:  labs showing:    Recent Labs  Lab 11/23/21 0800 11/24/21 0321 11/25/21 0326 11/26/21 0344 11/29/21 1543  NA 142 140 140 142 137  K 4.1 4.0 3.9 3.9 4.0  CO2 '26 26 27 31 27  '$ GLUCOSE 111* 95 114* 105* 110*  BUN '21 20 18 22 18  '$ CREATININE 0.88 0.86 0.69 0.66 0.94  CALCIUM 8.4* 8.3* 8.5* 9.0 8.8*  MG 2.1  --   --   2.0  --     Cr  stable,   Lab Results  Component Value Date   CREATININE 0.94 11/29/2021   CREATININE 0.66 11/26/2021   CREATININE 0.69 11/25/2021    Recent Labs  Lab 11/23/21 0800 11/29/21 1543  AST 72* 39  ALT 21 30  ALKPHOS 66 75  BILITOT 0.5 0.7  PROT 5.7* 6.0*  ALBUMIN 3.3* 3.2*   Lab Results  Component Value Date   CALCIUM 8.8 (L) 11/29/2021   PHOS 3.5 10/10/2013    Plt: Lab Results  Component Value Date   PLT 303 11/29/2021    COVID-19 Labs  No results for input(s): "DDIMER", "FERRITIN", "LDH", "CRP" in the last 72 hours.  Lab Results  Component Value Date   SARSCOV2NAA NEGATIVE 11/29/2021   [place] NEGATIVE 07/12/2020    Recent Labs  Lab 11/23/21 0800 11/23/21 1355 11/24/21 0321 11/24/21 1544 11/25/21 0326 11/26/21 0344 11/29/21 1543  WBC 10.6* 12.1* 9.2  --  9.1 8.8 10.8*  NEUTROABS 8.2*  --  6.3  --  6.5  --  6.9  HGB 7.8* 7.7* 6.7* 10.7* 9.6* 11.0* 10.5*  HCT 25.2* 24.5* 21.3* 33.4* 29.7* 34.0* 34.1*  MCV 94.4 93.9 93.8  --  90.5 90.4 93.4  PLT 165 197 161  --  180 199 303    HG/HCT  stable,      Component Value Date/Time   HGB 10.5 (L) 11/29/2021 1543   HGB 11.9 08/07/2016 1029   HCT  34.1 (L) 11/29/2021 1543   HCT 37.7 08/07/2016 1029   MCV 93.4 11/29/2021 1543   MCV 90.8 08/07/2016 1029     Cardiac Panel (last 3 results) No results for input(s): "CKTOTAL", "CKMB", "TROPONINI", "RELINDX" in the last 72 hours.  .car BNP (last 3 results) No results for input(s): "BNP" in the last 8760 hours.    DM  labs:  HbA1C: Recent Labs    01/18/21 1609 06/23/21 0924  HGBA1C 5.6 5.6       CBG (last 3)  No results for input(s): "GLUCAP" in the last 72 hours.        Cultures:    Component Value Date/Time   SDES  02/18/2020 1504    URINE, RANDOM Performed at [facility], [address] [place]elaine Bhat [place], [place] [address]    SPECREQUEST  02/18/2020 1504    NONE Performed at [facility], [address] [place], [place] [address]    CULT (A) 02/18/2020 1504    <10,000 COLONIES/mL INSIGNIFICANT GROWTH Performed at [facility] [address], [place], [place] [address]    REPTSTATUS 02/20/2020 FINAL 02/18/2020 1504     Radiological Exams on Admission: CT Angio Chest PE W/Cm &/Or Wo Cm  Result Date: 11/29/2021 CLINICAL DATA:  Positive D-dimer.  Weakness.  Recent hip surgery. EXAM: CT ANGIOGRAPHY CHEST WITH CONTRAST TECHNIQUE: Multidetector CT imaging of the chest was performed using the standard protocol during bolus administration of intravenous contrast. Multiplanar CT image reconstructions and MIPs were obtained to evaluate the vascular anatomy. RADIATION DOSE REDUCTION: This exam was performed according to the departmental dose-optimization program which includes automated exposure control, adjustment of the mA and/or kV according to patient size and/or use of iterative reconstruction technique. CONTRAST:  39m OMNIPAQUE IOHEXOL 350 MG/ML SOLN COMPARISON:  CT chest abdomen and pelvis 06/07/2017 FINDINGS: Cardiovascular: There is adequate opacification of the pulmonary arteries. There are segmental and subsegmental right lower lobe pulmonary emboli. Heart and aorta are normal in size. No pericardial effusion. There are atherosclerotic calcifications of the aorta. Mediastinum/Nodes: No enlarged mediastinal, hilar, or axillary lymph nodes. Thyroid gland, trachea, and esophagus demonstrate no significant findings. Lungs/Pleura: There is some atelectasis in the right middle lobe. There is also some stable scarring in the right middle lobe. Fissural nodule measuring 3 mm in the right lower lobe is favored as intrapulmonary lymph node. This is unchanged from 2019 and favored as benign. No pleural effusion or pneumothorax identified. Upper Abdomen: Cholecystectomy clips are present. Musculoskeletal: T12 vertebroplasty changes again noted. Review of the MIP images confirms the above findings.  IMPRESSION: 1. Right lower lobe segmental and subsegmental pulmonary emboli. Right heart strain present with RV to LV ratio 1.1. 2. Right middle lobe atelectasis and scarring. Aortic Atherosclerosis (ICD10-I70.0). Electronically Signed   By: ARonney AstersM.D.   On: 11/29/2021 20:48   VAS [place]OWER EXTREMITY VENOUS (DVT) (7a-7p)  Result Date: 11/29/2021  Lower Venous DVT Study Patient Name:  Shannon [name] Date of Exam:   11/29/2021 Medical Rec #: [id number]     Accession #:    [id number] of Birth: 512-18-1947      Patient Gender: F Patient Age:   89years [facility]:      VAS [place]OWER EXTREMITY VENOUS (DVT) Referring Phys: MHoward Young Med CtrPFEIFFER --------------------------------------------------------------------------------  Indications: Swelling, and Pain.  Risk Factors: Surgery Left hip fracture with surgical repair (11/21/21). Limitations: Body habitus  and poor ultrasound/tissue interface. Comparison Study: Previous exam on 09/21/20 was negative for DVT Performing Technologist: Rogelia Rohrer RVT, RDMS  Examination Guidelines: A complete evaluation includes B-mode imaging, spectral Doppler, color Doppler, and power Doppler as needed of all accessible portions of each vessel. Bilateral testing is considered an integral part of a complete examination. Limited examinations for reoccurring indications may be performed as noted. The reflux portion of the exam is performed with the patient in reverse Trendelenburg.  +-----+---------------+---------+-----------+----------+--------------+ RIGHTCompressibilityPhasicitySpontaneityPropertiesThrombus Aging +-----+---------------+---------+-----------+----------+--------------+ CFV  Full           Yes      Yes                                 +-----+---------------+---------+-----------+----------+--------------+   +---------+---------------+---------+-----------+----------+--------------+ LEFT      CompressibilityPhasicitySpontaneityPropertiesThrombus Aging +---------+---------------+---------+-----------+----------+--------------+ CFV      Full           Yes      Yes                                 +---------+---------------+---------+-----------+----------+--------------+ SFJ      Full                                                        +---------+---------------+---------+-----------+----------+--------------+ FV Prox  Full           Yes      Yes                                 +---------+---------------+---------+-----------+----------+--------------+ FV Mid   Full           Yes      Yes                                 +---------+---------------+---------+-----------+----------+--------------+ FV DistalFull           Yes      Yes                                 +---------+---------------+---------+-----------+----------+--------------+ PFV      Full                                                        +---------+---------------+---------+-----------+----------+--------------+ POP      Full           Yes      Yes                                 +---------+---------------+---------+-----------+----------+--------------+ PTV      Full                                                        +---------+---------------+---------+-----------+----------+--------------+  PERO     Full                                                        +---------+---------------+---------+-----------+----------+--------------+    Summary: RIGHT: - No evidence of common femoral vein obstruction.  LEFT: - There is no evidence of deep vein thrombosis in the lower extremity.  - No cystic structure found in the popliteal fossa.  *See table(s) above for measurements and observations. Electronically signed by Harold Barban MD on 11/29/2021 at 8:45:35 PM.    Final    DG Hip Unilat W or Wo Pelvis 2-3 Views Left  Result Date: 11/29/2021 CLINICAL DATA:  Hip pain 1  week post replacement. Pain and swelling in left hip and leg. EXAM: DG HIP (WITH OR WITHOUT PELVIS) 2-3V LEFT COMPARISON:  Preoperative radiograph 11/21/2021 FINDINGS: Left hip arthroplasty in expected alignment. Cerclage wire fixation about the intertrochanteric femur. No periprosthetic lucency or fracture. Chronic changes about the pubic symphysis. Skin staples in place. Soft tissue edema laterally IMPRESSION: Left hip arthroplasty without complication. Electronically Signed   By: Keith Rake M.D.   On: 11/29/2021 18:39   DG Chest Port 1 View  Result Date: 11/29/2021 CLINICAL DATA:  Question sepsis EXAM: PORTABLE CHEST 1 VIEW COMPARISON:  Chest 11/21/2021 FINDINGS: Improved aeration right lung base.  Left lung clear. Heart size upper normal.  Vascularity normal.  No edema or effusion. IMPRESSION: Improved aeration right lung base. No acute abnormality. Electronically Signed   By: Franchot Gallo M.D.   On: 11/29/2021 16:37   _______________________________________________________________________________________________________ Latest  Blood pressure 113/64, pulse 73, temperature 98.2 F (36.8 C), temperature source Oral, resp. rate 20, last menstrual period 01/30/1994, SpO2 95 %.   Vitals  labs and radiology finding personally reviewed  Review of Systems:    Pertinent positives include:  fatigue, left leg swelling  Constitutional:  No weight loss, night sweats, Fevers, chills, weight loss  HEENT:  No headaches, Difficulty swallowing,Tooth/dental problems,Sore throat,  No sneezing, itching, ear ache, nasal congestion, post nasal drip,  Cardio-vascular:  No chest pain, Orthopnea, PND, anasarca, dizziness, palpitations.no Bilateral lower extremity swelling  GI:  No heartburn, indigestion, abdominal pain, nausea, vomiting, diarrhea, change in bowel habits, loss of appetite, melena, blood in stool, hematemesis Resp:  no shortness of breath at rest. No dyspnea on exertion, No excess mucus,  no productive cough, No non-productive cough, No coughing up of blood.No change in color of mucus.No wheezing. Skin:  no rash or lesions. No jaundice GU:  no dysuria, change in color of urine, no urgency or frequency. No straining to urinate.  No flank pain.  Musculoskeletal:  No joint pain or no joint swelling. No decreased range of motion. No back pain.  Psych:  No change in mood or affect. No depression or anxiety. No memory loss.  Neuro: no localizing neurological complaints, no tingling, no weakness, no double vision, no gait abnormality, no slurred speech, no confusion  All systems reviewed and apart from [place] all are negative _______________________________________________________________________________________________ Past Medical History:   Past Medical History:  Diagnosis Date   Abnormal nuclear stress test 12/16/2013   Anemia 08/28/2011   Asthma    as a child   Breast cancer of lower-inner quadrant of right female breast 2011   Carpal tunnel syndrome of left wrist  Cataracts, bilateral 12/28/2015   Chest pain 02/27/2016   Closed fracture of maxilla 10/17/2017   Congestive dilated cardiomyopathy 12/30/2012   Constipation 03/03/2012   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 Echo 2012: EF 45-50%;  b. Lex MV 2/12:  low risk, apical defect (small area of ischemia vs shifting breast atten);  c.  Echo 7/12: Normal wall thickness, EF 60-65%, normal wall motion, grade 1 diastolic dysfunction, mild LAE, PASP 32;   d. Lex MV 11/13:  EF 76%, no ischemia   OA (osteoarthritis) 09/07/2011   Obesity 09/28/2014   Osteoporosis 03/07/2011   DEXA T score -2.6 AP spine 03/07/11    Formatting of this note might be different from the original. Formatting of this note might be different from the original. DEXA T score -2.6 AP spine 03/07/11   Last Assessment & Plan:  Formatting of this note might be different from the original. Encouraged to get adequate exercise, calcium and vitamin d intake   Pain in joint, ankle and foot 09/28/2014   Palpitation 10/28/2020   Personal history of chemotherapy 2012   Personal history of radiation therapy 2012   Prediabetes 06/15/2009   Recurrent falls 12/28/2015   Right knee pain 07/10/2020   Shortness of breath    UTI (urinary tract infection) 03/03/2012   Vertebral fracture, osteoporotic, sequela 04/05/2016   Vitamin D deficiency 06/14/2017   Supplement and monitor    Past Surgical History:  Procedure Laterality Date   BREAST LUMPECTOMY  04/2009   RIGHT FOR BREAST CANCER-CHEMO/RADIATION X 1 YEAR   BREAST REDUCTION SURGERY Bilateral 05/04/2014   Procedure: MAMMARY REDUCTION  (BREAST);  Surgeon: Cristine Polio, MD;  Location: [facility];  Service: Plastics;  Laterality: Bilateral;   CARPAL TUNNEL RELEASE     L wrist, ulnar nerve moved   CHOLECYSTECTOMY     CRANIOTOMY FOR TUMOR  2000   ELBOW SURGERY     left   KNEE ARTHROSCOPY Left 10/12/2014   Procedure: LEFT KNEE ARTHROSCOPY ;   Surgeon: Gaynelle Arabian, MD;  Location: [facility];  Service: Orthopedics;  Laterality: Left;   KYPHOPLASTY N/A 03/30/2016   Procedure: THORACIC 12 KYPHOPLASTY;  Surgeon: Phylliss Bob, MD;  Location: [facility];  Service: Orthopedics;  Laterality: N/A;  THORACIC 12 KYPHOPLASTY   LEFT HEART CATHETERIZATION WITH CORONARY ANGIOGRAM N/A 12/16/2013   Procedure: LEFT HEART CATHETERIZATION WITH CORONARY ANGIOGRAM;  Surgeon: Peter M [name], MD;  Location: [facility] C[facility];  Service: Cardiovascular;  Laterality: N/A;   LIPOMA EXCISION  03/28/2009   right leg   MELANOMA EXCISION     PORT-A-CATH REMOVAL  11/30/2010   [place]   porta cath     PORTACATH PLACEMENT  may 2011   REDUCTION MAMMAPLASTY Bilateral    SYNOVECTOMY Left 10/12/2014   Procedure: WITH SYNOVECTOMY;  Surgeon: Gaynelle Arabian, MD;  Location: [facility];  Service: Orthopedics;  Laterality: Left;   TONSILLECTOMY  1958   TOTAL HIP ARTHROPLASTY Left 11/21/2021   Procedure: TOTAL HIP ARTHROPLASTY ANTERIOR APPROACH;  Surgeon: Rod Can, MD;  Location: [facility];  Service: Orthopedics;  Laterality: Left;   TOTAL KNEE ARTHROPLASTY  02/05/2012   Procedure: TOTAL KNEE ARTHROPLASTY;  Surgeon: Gearlean Alf, MD;  Location: [facility];  Service: Orthopedics;  Laterality: Right;   TOTAL KNEE ARTHROPLASTY Left 02/10/2013   Procedure: LEFT TOTAL KNEE ARTHROPLASTY;  Surgeon: Gearlean Alf, MD;  Location: [facility];  Service: Orthopedics;  Laterality: Left;   total knee raplacement  01-2012   Right Knee   TOTAL KNEE REVISION Left 07/14/2020   Procedure: TOTAL KNEE REVISION;  Surgeon: Gaynelle Arabian, MD;  Location: [facility];  Service: Orthopedics;  Laterality: Left;   TUBAL LIGATION  1997    Social History:  Ambulatory   walker       reports that she has never smoked. She has never been exposed to tobacco smoke. She has never used smokeless tobacco. She reports that she does not drink alcohol and does not use drugs.   Family History:  Family History  Problem Relation Age of  Onset   Breast cancer Mother        sarcoma   Lung cancer Mother    Hypertension Mother    Prostate cancer Father    Congestive Heart Failure Father    Heart attack Father    Prostate cancer Brother    Heart disease Maternal Grandfather        MI   Down syndrome Son    CVA Son    Stomach cancer Maternal Aunt    Dementia Maternal Aunt    Uterine cancer Maternal Aunt    Colon cancer Neg Hx    ______________________________________________________________________________________________ Allergies: Allergies  Allergen Reactions   Dilaudid [Hydromorphone Hcl] Itching and Other (See Comments)    "Went Crazy"      Prior to Admission medications   Medication Sig Start Date End Date Taking? Authorizing Provider  acetaminophen (TYLENOL) 325 MG tablet Take 2 tablets (650 mg total) by mouth every 6 (six) hours as needed for mild pain or moderate pain (pain score 1-3 or temp > 100.5). 11/26/21   Eugenie Filler, MD  albuterol (VENTOLIN HFA) 108 (90 Base) MCG/ACT inhaler INHALE 1-2 PUFFS INTO THE LUNGS EVERY 6 (SIX) HOURS AS NEEDED FOR WHEEZING OR SHORTNESS OF BREATH. Patient taking differently: Inhale 2 puffs into the lungs 4 (four) times daily as needed for wheezing or shortness of breath. 02/24/21   Mosie Lukes, MD  aspirin (ASPIRIN CHILDRENS) 81 MG chewable tablet Chew 1 tablet (81 mg total) by mouth 2 (two) times daily with a meal. 11/22/21 01/06/22  Hill, Marciano Sequin, PA-C  Calcium Carbonate (CALCIUM 600 PO) Take 1 tablet by mouth daily.    [provider]  carvedilol (COREG) 25 MG tablet TAKE 1 TABLET TWICE DAILY WITH A MEAL Patient taking differently: Take 25 mg by mouth 2 (two) times daily with a meal. 02/01/21   Crenshaw, Denice Bors, MD  cetirizine (ZYRTEC) 10  MG tablet Take 10 mg by mouth at bedtime.    [provider]  Cholecalciferol (VITAMIN D3) 1000 units CAPS Take 1,000 Units by mouth daily.    [provider]  diltiazem (CARDIZEM CD) 120 MG 24 hr  capsule TAKE 1 CAPSULE BY MOUTH EVERY DAY Patient taking differently: Take 120 mg by mouth at bedtime. 05/04/21   Lelon Perla, MD  docusate sodium (COLACE) 100 MG capsule Take 1 capsule (100 mg total) by mouth 2 (two) times daily. 11/26/21   Eugenie Filler, MD  donepezil (ARICEPT) 10 MG tablet TAKE 1 TABLET AT BEDTIME Patient taking differently: Take 10 mg by mouth at bedtime. 08/01/21   Rondel Jumbo, PA-C  famotidine (PEPCID) 20 MG tablet TAKE 1 TABLET TWICE DAILY Patient taking differently: Take 20 mg by mouth daily. 10/17/21   Mosie Lukes, MD  ferrous sulfate 325 (65 FE) MG tablet Take 325 mg by mouth daily with breakfast.    [provider]  folic acid (FOLVITE) 1 MG tablet Take 1 tablet (1 mg total) by mouth daily. 01/19/20   Mosie Lukes, MD  gabapentin (NEURONTIN) 300 MG capsule Take 1 capsule (300 mg total) by mouth 2 (two) times daily. 02/14/21   Pieter Partridge, DO  HYDROcodone-acetaminophen (NORCO) 10-325 MG tablet Take 0.5 tablets by mouth every 4 (four) hours as needed for up to 7 days for severe pain. 11/22/21 11/29/21  Charlott Rakes, PA-C  hydrOXYzine (ATARAX) 25 MG tablet TAKE 0.5-1 TABLETS (12.5-25 MG TOTAL) BY MOUTH 2 (TWO) TIMES DAILY AS NEEDED. Patient taking differently: Take 25 mg by mouth in the morning and at bedtime. 04/15/21   Mosie Lukes, MD  Multiple Vitamin (MULTIVITAMIN WITH MINERALS) TABS tablet Take 1 tablet by mouth daily.    [provider]  nystatin (MYCOSTATIN/NYSTOP) powder Apply topically 3 (three) times daily for 7 days. Apply to affected areas ie under right breast 11/26/21 12/03/21  Eugenie Filler, MD  omeprazole (PRILOSEC) 20 MG capsule Take 20 mg by mouth at bedtime.    [provider]  pentosan polysulfate (ELMIRON) 100 MG capsule Take 100 mg by mouth as directed. two at morning and two at night 02/07/12   Perkins, Alexzandrew L, PA-C  polyethylene glycol (MIRALAX / GLYCOLAX) 17 g packet Take 17 g by mouth daily  as needed for mild constipation. 11/26/21   Eugenie Filler, MD  rosuvastatin (CRESTOR) 20 MG tablet TAKE 1 TABLET AT BEDTIME Patient taking differently: Take 20 mg by mouth at bedtime. 11/02/21   Lelon Perla, MD  venlafaxine XR (EFFEXOR-XR) 75 MG 24 hr capsule TAKE 3 CAPSULES EVERY DAY WITH BREAKFAST Patient taking differently: Take 225 mg by mouth daily. 08/31/21   Mosie Lukes, MD    ___________________________________________________________________________________________________ Physical Exam:    11/29/2021   11:00 PM 11/29/2021    9:00 PM 11/29/2021    8:15 PM  Vitals with BMI  Systolic [phone]  Diastolic 64 63 70  Pulse 73 77 73    1. General:  in No  Acute distress   Chronically ill   -appearing 2. Psychological: Alert and  Oriented 3. Head/ENT:    Dry Mucous Membranes                          Head Non traumatic, neck supple  Poor Dentition 4. SKIN:  decreased Skin turgor,  Skin clean Dry left leg bruising         5. Heart: Regular rate and rhythm no  Murmur, no Rub or gallop 6. Lungs:  , no wheezes or crackles   7. Abdomen: Soft,  non-tender, Non distended   obese   8. Lower extremities: no clubbing, cyanosis,  left leg edema 9. Neurologically Grossly intact, moving all 4 extremities equally   10. MSK: Normal range of motion limited in left leg    Chart has been reviewed  ______________________________________________________________________________________________  Assessment/Plan  76 y.o. female with medical history significant of    Left hip fracture, chronic diastolic CHF< HTN , dementia, HLD, GERD, anemia   Admitted for  pulmonary embolism   Present on Admission:  Acute pulmonary embolism (HCC)  Chronic diastolic CHF (congestive heart failure) ([place])  Essential hypertension  Alzheimer's dementia without behavioral disturbance (HCC)  Mixed hyperlipidemia  GERD without esophagitis  Postoperative anemia due to  acute blood loss     Chronic diastolic CHF (congestive heart failure) (HCC) Currently appears to be euvolemic we will continue to monitor fluid status  Essential hypertension Allow permissive hypertension for tonight  Alzheimer's dementia without behavioral disturbance (HCC) Chronic stable monitor for any sign of sundowning  Mixed hyperlipidemia Chronic continue Crestor 20 mg p.o. daily  GERD without esophagitis Continue Pepcid 20 mg p.o. daily  Postoperative anemia due to acute blood loss Chronic stable continue to monitor CBC once on heparin  Acute pulmonary embolism ([place])  Admit to   Telemetry  Initiate heparin drip   Would likely benefit from case manager consult for long term anticoagulation Hold home blood pressure medications avoid hypotension Cycle cardiac enzymes Order echogram and lower extremity Dopplers  Most likely risk factors for hypercoagulable state being malignancy recent surgery     Other plan as per orders.  DVT prophylaxis: heparin   Code Status:    Code Status: Prior FULL CODE   as per patient   I had personally discussed CODE STATUS with patient     Family Communication:   Family not at  Bedside    Disposition Plan:                              Back to current facility when stable                               Following barriers for discharge:                            Electrolytes corrected                             PE treated                           Will need consultants to evaluate patient prior to discharge                       Transition of care consulted                 Consults called: orthopedics are aware  Admission status:  ED Disposition     ED Disposition  Admit   Condition  --  Comment  Hospital Area: [facility] [[id number]]  Level of Care: Telemetry [5]  Admit to tele based on following criteria: Other see comments  Comments: pe  May place patient in observation at [facility] or [place]  if equivalent level of care is available:: No  Covid Evaluation: Asymptomatic - no recent exposure (last 10 days) testing not required  Diagnosis: Pulmonary embolism [facility]) [[id number]]  Admitting Physician: Toy Baker [[id number]]  Attending Physician: Toy Baker [[id number]]           Obs    Level of care     tele  For 12H      Lab Results  Component Value Date   [place] 11/29/2021     Precautions: admitted as   Covid Negative      Trystan Eads 11/30/2021, 1:02 AM    [facility]     after 2 AM please page floor coverage PA If 7AM-7PM, please contact the day team taking care of the patient using [email or web address]   Patient was evaluated in the context of the global COVID-19 pandemic, which necessitated consideration that the patient might be at risk for infection with the SARS-CoV-2 virus that causes COVID-19. Institutional protocols and algorithms that pertain to the evaluation of patients at risk for COVID-19 are in a state of rapid change based on information released by regulatory bodies including the CDC and federal and state organizations. These policies and algorithms were followed during the patient's care.

## 2021-11-29 NOTE — Assessment & Plan Note (Signed)
Chronic continue Crestor 20 mg p.o. daily

## 2021-11-29 NOTE — Progress Notes (Signed)
ANTICOAGULATION CONSULT NOTE - Initial Consult  Pharmacy Consult for Heparin Indication: pulmonary embolus  Allergies  Allergen Reactions   Dilaudid [Hydromorphone Hcl] Itching and Other (See Comments)    "Went Crazy"     Patient Measurements:   Heparin Dosing Weight: 65.2 kg  Vital Signs: Temp: 98.2 F (36.8 C) (10/31 2118) Temp Source: Oral (10/31 2118) BP: 122/63 (10/31 2100) Pulse Rate: 77 (10/31 2100)  Labs: Recent Labs    11/29/21 1543  HGB 10.5*  HCT 34.1*  PLT 303  APTT 27  LABPROT 14.3  INR 1.1  CREATININE 0.94  TROPONINIHS 4    Estimated Creatinine Clearance: 47.3 mL/min (by C-G formula based on SCr of 0.94 mg/dL).   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 Echo 2012: EF 45-50%;  b. Lex MV 2/12:  low risk, apical defect (small area of ischemia vs shifting breast atten);  c.  Echo 7/12: Normal wall thickness, EF 60-65%, normal wall motion, grade 1 diastolic dysfunction, mild LAE, PASP 32;   d. Lex MV 11/13:  EF 76%, no ischemia   OA (osteoarthritis) 09/07/2011   Obesity 09/28/2014   Osteoporosis 03/07/2011   DEXA T score -2.6 AP spine 03/07/11    Formatting of this note might be different from the original. Formatting of this note might be different from the original. DEXA T score -2.6 AP spine 03/07/11   Last Assessment & Plan:  Formatting of this note might be different from the original. Encouraged to get adequate exercise, calcium and vitamin d intake   Pain in joint, ankle and foot 09/28/2014   Palpitation 10/28/2020   Personal history of chemotherapy 2012   Personal history of radiation therapy 2012   Prediabetes 06/15/2009   Recurrent falls 12/28/2015   Right knee pain 07/10/2020   Shortness of breath    UTI (urinary tract infection) 03/03/2012   Vertebral fracture, osteoporotic, sequela 04/05/2016   Vitamin D deficiency 06/14/2017   Supplement and monitor    Medications:  No anticoagulants prior to  admission  Assessment: Pharmacy consulted to dose heparin for PE for this 76 year old female who had total hip arthroplasty on 11/21/2021 and discharged on 10/28 to SNF.  Readmitted today with c/o weakness, fatigue and pain & swelling in left hip/leg.  CT Angio Chest performed today showed PE with right heart strain.   Goal of Therapy:  Heparin level 0.3-0.7 units/ml Monitor platelets by anticoagulation protocol: Yes   Plan:  Give 5000 units heparin IV bolus via infusion x 1 Start heparin infusion at 1100 units/hr Check heparin level in 8 hours and daily while on  heparin Continue to monitor H&H and platelets   Thank you for allowing pharmacy to be a part of this patient's care.  Royetta Asal, PharmD, BCPS Clinical Pharmacist [place] Please utilize Amion for appropriate phone number to reach the unit pharmacist ([place]) 11/29/2021 9:35 PM

## 2021-11-29 NOTE — Assessment & Plan Note (Signed)
Chronic stable monitor for any sign of sundowning

## 2021-11-29 NOTE — Assessment & Plan Note (Signed)
Continue Pepcid 20 mg p.o. daily

## 2021-11-29 NOTE — ED Triage Notes (Signed)
BIBA Per EMS: Pt arrives from [facility] rehab w/ c/o weakness, fatigue & pain & swelling in left hip/leg. Pt had recent hip surgery 10 days ago. Pt was d/c from [facility]. Pt was walking and today has been unable to do so. VSS

## 2021-11-30 ENCOUNTER — Other Ambulatory Visit (HOSPITAL_COMMUNITY): Payer: Medicare Other

## 2021-11-30 DIAGNOSIS — I5032 Chronic diastolic (congestive) heart failure: Secondary | ICD-10-CM | POA: Diagnosis not present

## 2021-11-30 DIAGNOSIS — G309 Alzheimer's disease, unspecified: Secondary | ICD-10-CM | POA: Diagnosis not present

## 2021-11-30 DIAGNOSIS — F0280 Dementia in other diseases classified elsewhere without behavioral disturbance: Secondary | ICD-10-CM | POA: Diagnosis not present

## 2021-11-30 DIAGNOSIS — I10 Essential (primary) hypertension: Secondary | ICD-10-CM | POA: Diagnosis not present

## 2021-11-30 DIAGNOSIS — K219 Gastro-esophageal reflux disease without esophagitis: Secondary | ICD-10-CM | POA: Diagnosis not present

## 2021-11-30 DIAGNOSIS — I2699 Other pulmonary embolism without acute cor pulmonale: Secondary | ICD-10-CM | POA: Diagnosis not present

## 2021-11-30 LAB — CBC
HCT: 30.9 % — ABNORMAL LOW (ref 36.0–46.0)
Hemoglobin: 9.6 g/dL — ABNORMAL LOW (ref 12.0–15.0)
MCH: 28.7 pg (ref 26.0–34.0)
MCHC: 31.1 g/dL (ref 30.0–36.0)
MCV: 92.5 fL (ref 80.0–100.0)
Platelets: 305 10*3/uL (ref 150–400)
RBC: 3.34 MIL/uL — ABNORMAL LOW (ref 3.87–5.11)
RDW: 14.1 % (ref 11.5–15.5)
WBC: 9.6 10*3/uL (ref 4.0–10.5)
nRBC: 0 % (ref 0.0–0.2)

## 2021-11-30 LAB — PROCALCITONIN
Procalcitonin: <0.1 ng/mL
Procalcitonin: <0.1 ng/mL

## 2021-11-30 LAB — MAGNESIUM
Magnesium: 1.8 mg/dL (ref 1.7–2.4)
Magnesium: 2 mg/dL (ref 1.7–2.4)

## 2021-11-30 LAB — PHOSPHORUS
Phosphorus: 3.2 mg/dL (ref 2.5–4.6)
Phosphorus: 3.5 mg/dL (ref 2.5–4.6)

## 2021-11-30 LAB — HEPARIN LEVEL (UNFRACTIONATED): Heparin Unfractionated: 0.64 IU/mL (ref 0.30–0.70)

## 2021-11-30 LAB — CK: Total CK: 102 U/L (ref 38–234)

## 2021-11-30 MED ORDER — APIXABAN 5 MG PO TABS
10.0000 mg | ORAL_TABLET | Freq: Two times a day (BID) | ORAL | Status: DC
  Administered 2021-11-30 – 2021-12-01 (×4): 10 mg via ORAL
  Filled 2021-11-30 (×4): qty 2

## 2021-11-30 MED ORDER — ADULT MULTIVITAMIN W/MINERALS CH
1.0000 | ORAL_TABLET | Freq: Every day | ORAL | Status: DC
  Administered 2021-11-30 – 2021-12-01 (×2): 1 via ORAL
  Filled 2021-11-30 (×2): qty 1

## 2021-11-30 MED ORDER — APIXABAN 5 MG PO TABS: 5.0000 mg | ORAL_TABLET | Freq: Two times a day (BID) | ORAL | Status: DC

## 2021-11-30 MED ORDER — SENNA 8.6 MG PO TABS
1.0000 | ORAL_TABLET | Freq: Two times a day (BID) | ORAL | Status: DC
  Administered 2021-11-30 – 2021-12-01 (×4): 8.6 mg via ORAL
  Filled 2021-11-30 (×4): qty 1

## 2021-11-30 MED ORDER — VENLAFAXINE HCL ER 75 MG PO CP24
225.0000 mg | ORAL_CAPSULE | Freq: Every day | ORAL | Status: DC
  Administered 2021-11-30 – 2021-12-01 (×2): 225 mg via ORAL
  Filled 2021-11-30 (×2): qty 1

## 2021-11-30 MED ORDER — HYDROCODONE-ACETAMINOPHEN 5-325 MG PO TABS
1.0000 | ORAL_TABLET | ORAL | Status: DC | PRN
  Administered 2021-11-30 (×2): 1 via ORAL
  Administered 2021-11-30: 2 via ORAL
  Administered 2021-11-30: 1 via ORAL
  Administered 2021-12-01: 2 via ORAL
  Administered 2021-12-01: 1 via ORAL
  Administered 2021-12-01: 2 via ORAL
  Filled 2021-11-30 (×3): qty 1
  Filled 2021-11-30 (×2): qty 2
  Filled 2021-11-30: qty 1
  Filled 2021-11-30: qty 2

## 2021-11-30 MED ORDER — GABAPENTIN 300 MG PO CAPS
300.0000 mg | ORAL_CAPSULE | Freq: Two times a day (BID) | ORAL | Status: DC
  Administered 2021-11-30 – 2021-12-01 (×5): 300 mg via ORAL
  Filled 2021-11-30 (×5): qty 1

## 2021-11-30 MED ORDER — FENTANYL CITRATE PF 50 MCG/ML IJ SOSY: 12.5000 ug | PREFILLED_SYRINGE | INTRAMUSCULAR | Status: DC | PRN

## 2021-11-30 MED ORDER — METHOCARBAMOL 1000 MG/10ML IJ SOLN: 500.0000 mg | Freq: Four times a day (QID) | INTRAVENOUS | Status: DC | PRN

## 2021-11-30 MED ORDER — ROSUVASTATIN CALCIUM 10 MG PO TABS
20.0000 mg | ORAL_TABLET | Freq: Every day | ORAL | Status: DC
  Administered 2021-11-30 – 2021-12-01 (×3): 20 mg via ORAL
  Filled 2021-11-30: qty 1
  Filled 2021-11-30 (×2): qty 2

## 2021-11-30 MED ORDER — SODIUM CHLORIDE 0.9 % IV SOLN: INTRAVENOUS | Status: AC

## 2021-11-30 MED ORDER — DONEPEZIL HCL 10 MG PO TABS
10.0000 mg | ORAL_TABLET | Freq: Every day | ORAL | Status: DC
  Administered 2021-11-30 – 2021-12-01 (×3): 10 mg via ORAL
  Filled 2021-11-30 (×2): qty 1
  Filled 2021-11-30: qty 2

## 2021-11-30 MED ORDER — HYDROXYZINE HCL 25 MG PO TABS
25.0000 mg | ORAL_TABLET | Freq: Three times a day (TID) | ORAL | Status: DC | PRN
  Administered 2021-11-30 (×2): 25 mg via ORAL
  Filled 2021-11-30 (×2): qty 1

## 2021-11-30 MED ORDER — ENSURE ENLIVE PO LIQD
237.0000 mL | Freq: Two times a day (BID) | ORAL | Status: DC
  Administered 2021-11-30 – 2021-12-01 (×3): 237 mL via ORAL

## 2021-11-30 MED ORDER — ACETAMINOPHEN 325 MG PO TABS: 650.0000 mg | ORAL_TABLET | Freq: Four times a day (QID) | ORAL | Status: DC | PRN

## 2021-11-30 MED ORDER — TRAMADOL HCL 50 MG PO TABS: 50.0000 mg | ORAL_TABLET | Freq: Four times a day (QID) | ORAL | Status: DC | PRN

## 2021-11-30 MED ORDER — POLYETHYLENE GLYCOL 3350 17 G PO PACK: 17.0000 g | PACK | Freq: Every day | ORAL | Status: DC | PRN

## 2021-11-30 MED ORDER — ACETAMINOPHEN 650 MG RE SUPP: 650.0000 mg | Freq: Four times a day (QID) | RECTAL | Status: DC | PRN

## 2021-11-30 MED ORDER — FAMOTIDINE 20 MG PO TABS
20.0000 mg | ORAL_TABLET | Freq: Every day | ORAL | Status: DC
  Administered 2021-11-30 – 2021-12-01 (×2): 20 mg via ORAL
  Filled 2021-11-30 (×2): qty 1

## 2021-11-30 MED ORDER — DOCUSATE SODIUM 100 MG PO CAPS
100.0000 mg | ORAL_CAPSULE | Freq: Two times a day (BID) | ORAL | Status: DC
  Administered 2021-11-30 – 2021-12-01 (×4): 100 mg via ORAL
  Filled 2021-11-30 (×4): qty 1

## 2021-11-30 NOTE — NC FL2 (Signed)
[place] LEVEL OF CARE SCREENING TOOL     IDENTIFICATION  Patient Name: Shannon [name] Birthdate: January 07, 1946 Sex: female Admission Date (Current Location): 11/29/2021  [facility] and [place] Number:  Herbalist and Address:  [facility],  [place], [place]      Provider Number: [id number]  Attending Physician Name and Address:  Mendel Corning, MD  Relative Name and Phone Number:  Daughter, Buddy Duty [phone]    Current Level of Care: Hospital Recommended Level of Care: [place] Prior Approval Number:    Date Approved/Denied:   PASRR Number: [id number]  Discharge Plan: SNF    Current Diagnoses: Patient Active Problem List   Diagnosis Date Noted   Acute pulmonary embolism ([place]) 11/29/2021   Chronic diastolic CHF (congestive heart failure) ([place]) 11/23/2021   Closed left hip fracture, initial encounter ([place]) 11/21/2021   Grade I diastolic dysfunction 78/58/8502   Fall 11/21/2021   Alzheimer's dementia without behavioral disturbance ([place]) 03/02/2021   Sleep apnea 01/12/2021   Depression with anxiety 01/12/2021   Mild neurocognitive disorder due to Alzheimer's disease 12/01/2020   History of melanoma 11/17/2020   Palpitation 10/28/2020   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   Right knee pain 07/10/2020   Urinary frequency 02/11/2020   Debility 12/23/2017   Left knee pain 12/23/2017   Vitamin D deficiency 06/14/2017   Vertebral fracture, osteoporotic 04/05/2016   Left-sided back pain 03/06/2016   Chest pain 02/27/2016   Syncope 02/27/2016   Cataracts, bilateral 12/28/2015   Recurrent falls 12/28/2015   Family history- stomach cancer 09/08/2015   Family history of cancer 09/08/2015   [name] lesion 06/21/2015   Left leg swelling 05/24/2015   Obesity 09/28/2014   Interstitial cystitis 09/28/2014   Pain in joint, ankle and foot 09/28/2014   Abnormal nuclear stress test  12/16/2013   Sternal pain 11/06/2013   Postoperative anemia due to acute blood loss 02/13/2013   Congestive dilated cardiomyopathy ([place]) 12/30/2012   Breast cancer of lower-inner quadrant of right female breast 10/21/2012   Constipation 03/03/2012   OA (osteoarthritis) 09/07/2011   Dermatitis    Hyponatremia 08/28/2011   Osteoporosis 03/07/2011   Prediabetes 06/15/2009   Lipoma 02/10/2009   Dyspnea on exertion 02/10/2009   Mixed hyperlipidemia 08/11/2008   Major depressive disorder 08/11/2008   Essential hypertension 08/11/2008   GERD without esophagitis 08/11/2008   Craniopharyngioma 2000    Orientation RESPIRATION BLADDER Height & Weight     Self, Time, Situation, Place  Normal Continent Weight:   Height:     BEHAVIORAL SYMPTOMS/MOOD NEUROLOGICAL BOWEL NUTRITION STATUS      Continent Diet (Regular)  AMBULATORY STATUS COMMUNICATION OF NEEDS Skin   Limited Assist Verbally Other (Comment) (surgical incision only)                       Personal Care Assistance Level of Assistance  Bathing, Dressing, Feeding Bathing Assistance: Limited assistance Feeding assistance: Independent Dressing Assistance: Limited assistance     Functional Limitations Info  Sight, Hearing, Speech Sight Info: Adequate Hearing Info: Adequate Speech Info: Adequate    SPECIAL CARE FACTORS FREQUENCY  PT (By licensed PT), OT (By licensed OT)     PT Frequency: 5x/wk OT Frequency: 5x/wk            Contractures Contractures Info: Not present    Additional Factors Info  Code Status, Allergies Code Status Info: FULL Allergies Info: dilaudid  Current Medications (11/30/2021):  This is the current hospital active medication list Current Facility-Administered Medications  Medication Dose Route Frequency Provider Last Rate Last Admin   acetaminophen (TYLENOL) tablet 650 mg  650 mg Oral Q6H PRN Toy Baker, MD       Or   acetaminophen (TYLENOL) suppository 650 mg  650  mg Rectal Q6H PRN Toy Baker, MD       apixaban (ELIQUIS) tablet 10 mg  10 mg Oral BID Pham, Anh P, RPH       Followed by   Derrill Memo ON 12/07/2021] apixaban (ELIQUIS) tablet 5 mg  5 mg Oral BID Pham, Anh P, RPH       docusate sodium (COLACE) capsule 100 mg  100 mg Oral BID Doutova, Anastassia, MD   100 mg at 11/30/21 0825   donepezil (ARICEPT) tablet 10 mg  10 mg Oral QHS Doutova, Anastassia, MD   10 mg at 11/30/21 0114   famotidine (PEPCID) tablet 20 mg  20 mg Oral Daily Doutova, Anastassia, MD   20 mg at 11/30/21 0825   fentaNYL (SUBLIMAZE) injection 12.5-50 mcg  12.5-50 mcg Intravenous Q2H PRN Doutova, Anastassia, MD       gabapentin (NEURONTIN) capsule 300 mg  300 mg Oral BID Doutova, Anastassia, MD   300 mg at 11/30/21 0825   HYDROcodone-acetaminophen (NORCO/VICODIN) 5-325 MG per tablet 1-2 tablet  1-2 tablet Oral Q4H PRN Toy Baker, MD   2 tablet at 11/30/21 0839   hydrOXYzine (ATARAX) tablet 25 mg  25 mg Oral TID PRN Toy Baker, MD   25 mg at 11/30/21 0831   methocarbamol (ROBAXIN) 500 mg in dextrose 5 % 50 mL IVPB  500 mg Intravenous Q6H PRN Doutova, Anastassia, MD       polyethylene glycol (MIRALAX / GLYCOLAX) packet 17 g  17 g Oral Daily PRN Doutova, Anastassia, MD       rosuvastatin (CRESTOR) tablet 20 mg  20 mg Oral QHS Doutova, Anastassia, MD   20 mg at 11/30/21 0115   senna (SENOKOT) tablet 8.6 mg  1 tablet Oral BID Doutova, Anastassia, MD   8.6 mg at 11/30/21 0825   traMADol (ULTRAM) tablet 50 mg  50 mg Oral Q6H PRN Toy Baker, MD       venlafaxine XR (EFFEXOR-XR) 24 hr capsule 225 mg  225 mg Oral Daily Doutova, Anastassia, MD   225 mg at 11/30/21 7616     Discharge Medications: Please see discharge summary for a list of discharge medications.  Relevant Imaging Results:  Relevant Lab Results:   Additional Information SS# [id number]  Vassie Moselle, LCSW

## 2021-11-30 NOTE — Progress Notes (Signed)
Initial Nutrition Assessment  INTERVENTION:   -Needs updated weight for admission  -Ensure Plus High Protein po BID, each supplement provides 350 kcal and 20 grams of protein.   -Multivitamin with minerals daily  NUTRITION DIAGNOSIS:   Increased nutrient needs related to acute illness, post-op healing as evidenced by estimated needs.  GOAL:   Patient will meet greater than or equal to 90% of their needs  MONITOR:   PO intake, Supplement acceptance, Labs, Weight trends, I & O's  REASON FOR ASSESSMENT:   Consult Assessment of nutrition requirement/status  ASSESSMENT:   76 y.o. female with medical history significant of     Left hip fracture, chronic diastolic CHF< HTN , dementia, HLD, GERD, anemia.   Admitted for  pulmonary embolism.  Patient recently discharged following admission for hip fracture, pt is s/p left THA 10/23. Discharged on 10/28. Returned to hospital 10/31. Pt reported at that time she would often skip meals, was recommended Ensure supplements. Consumed 100% of meals during admission. Will resume Ensure for post-op healing needs. Will add daily MVI as well.  No weight recorded for readmission. Last recorded weight is 171 lbs on 10/27.   Medications: Colace, Pepcid, Senokot  Labs reviewed.  NUTRITION - FOCUSED PHYSICAL EXAM:  Flowsheet Row Most Recent Value  Orbital Region No depletion  Upper Arm Region Mild depletion  Thoracic and Lumbar Region No depletion  Buccal Region No depletion  Temple Region Mild depletion  Clavicle Bone Region No depletion  Clavicle and Acromion Bone Region No depletion  Scapular Bone Region No depletion  Dorsal Hand Mild depletion  Patellar Region No depletion  Anterior Thigh Region No depletion  Posterior Calf Region No depletion  Edema (RD Assessment) None  Hair Reviewed  Eyes Reviewed  Mouth Reviewed  Skin Reviewed  Nails Reviewed       Diet Order:   Diet Order             Diet Heart Room service  appropriate? Yes; Fluid consistency: Thin  Diet effective now                   EDUCATION NEEDS:   No education needs have been identified at this time  Skin:  Skin Integrity Issues:: Incisions Incisions: 10/23: breast, left hip  Last BM:  10/31  Height:   Ht Readings from Last 1 Encounters:  11/21/21 '5\' 1"'$  (1.549 m)    Weight:   Wt Readings from Last 1 Encounters:  11/25/21 77.8 kg    BMI: 32.3 kg/m^2  Estimated Nutritional Needs:   Kcal:  1900-2100  Protein:  75-90g  Fluid:  2L/day  Clayton Bibles, MS, RD, LDN Inpatient Clinical Dietitian Contact information available via Amion

## 2021-11-30 NOTE — ED Notes (Signed)
ED TO INPATIENT HANDOFF REPORT  ED Nurse Name and Phone #:  Tonette Bihari [phone]  S Name/Age/Gender Shannon [name] 75 y.o. female Room/Bed: WA13/WA13  Code Status   Code Status: Full Code  Home/SNF/Other Home Patient oriented to: self, place, and time Is this baseline? No   Triage Complete: Triage complete  Chief Complaint Pulmonary embolism ([place]) [I26.99]  Triage Note BIBA Per EMS: Pt arrives from [employer] [facility] [employer] rehab w/ c/o weakness, fatigue & pain & swelling in left hip/leg. Pt had recent hip surgery 10 days ago. Pt was d/c from [facility]. Pt was walking and today has been unable to do so. VSS    Allergies Allergies  Allergen Reactions   Dilaudid [Hydromorphone Hcl] Itching and Other (See Comments)    "Went Crazy"     Level of Care/Admitting Diagnosis ED Disposition     ED Disposition  Admit   Condition  --   Comment  Hospital Area: [place] [[id number]]  Level of Care: Telemetry [5]  Admit to tele based on following criteria: Other see comments  Comments: pe  May place patient in observation at [facility] or [place] if equivalent level of care is available:: No  Covid Evaluation: Asymptomatic - no recent exposure (last 10 days) testing not required  Diagnosis: Pulmonary embolism [facility]) [[id number]]  Admitting Physician: Blyn, [place]  Attending Physician: Toy Baker [[id number]]          B Medical/Surgery History Past Medical History:  Diagnosis Date   Abnormal nuclear stress test 12/16/2013   Anemia 08/28/2011   Asthma    as a child   Breast cancer of lower-inner quadrant of right female breast 2011   Carpal tunnel syndrome of left wrist    Cataracts, bilateral 12/28/2015   Chest pain 02/27/2016   Closed fracture of maxilla 10/17/2017   Congestive dilated cardiomyopathy 12/30/2012   Constipation 03/03/2012   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 Echo 2012: EF 45-50%;  b. Lex MV 2/12:  low risk, apical defect (small area of ischemia vs shifting breast atten);  c.  Echo 7/12: Normal wall thickness, EF 60-65%, normal wall motion, grade 1 diastolic dysfunction, mild LAE, PASP 32;   d. Lex MV 11/13:  EF 76%, no ischemia   OA (osteoarthritis) 09/07/2011   Obesity 09/28/2014   Osteoporosis 03/07/2011   DEXA T score -2.6 AP spine 03/07/11    Formatting of this note might be different from the original. Formatting  of this note might be different from the original. DEXA T score -2.6 AP spine 03/07/11   Last Assessment & Plan:  Formatting of this note might be different from the original. Encouraged to get adequate exercise, calcium and vitamin d intake   Pain in joint, ankle and foot 09/28/2014   Palpitation 10/28/2020   Personal history of chemotherapy 2012   Personal history of radiation therapy 2012   Prediabetes 06/15/2009   Recurrent falls 12/28/2015   Right knee pain 07/10/2020   Shortness of breath    UTI (urinary tract infection) 03/03/2012   Vertebral fracture,  osteoporotic, sequela 04/05/2016   Vitamin D deficiency 06/14/2017   Supplement and monitor   Past Surgical History:  Procedure Laterality Date   BREAST LUMPECTOMY  04/2009   RIGHT FOR BREAST CANCER-CHEMO/RADIATION X 1 YEAR   BREAST REDUCTION SURGERY Bilateral 05/04/2014   Procedure: MAMMARY REDUCTION  (BREAST);  Surgeon: Cristine Polio, MD;  Location: [place];  Service: Plastics;  Laterality: Bilateral;   CARPAL TUNNEL RELEASE     L wrist, ulnar nerve moved   CHOLECYSTECTOMY     CRANIOTOMY FOR TUMOR  2000   ELBOW SURGERY     left   KNEE ARTHROSCOPY Left 10/12/2014   Procedure: LEFT KNEE ARTHROSCOPY ;  Surgeon: Gaynelle Arabian, MD;  Location: [facility];  Service: Orthopedics;  Laterality: Left;   KYPHOPLASTY N/A 03/30/2016   Procedure: THORACIC 12 KYPHOPLASTY;  Surgeon: Phylliss Bob, MD;  Location: [place];  Service: Orthopedics;  Laterality: N/A;  THORACIC 12 KYPHOPLASTY   LEFT HEART CATHETERIZATION WITH CORONARY ANGIOGRAM N/A 12/16/2013   Procedure: LEFT HEART CATHETERIZATION WITH CORONARY ANGIOGRAM;  Surgeon: Peter M [name], MD;  Location: [facility] CATH LAB;  Service: Cardiovascular;  Laterality: N/A;   LIPOMA EXCISION  03/28/2009   right leg   MELANOMA EXCISION     PORT-A-CATH REMOVAL  11/30/2010   [place]   porta cath     PORTACATH PLACEMENT  may 2011   REDUCTION MAMMAPLASTY Bilateral    SYNOVECTOMY Left 10/12/2014   Procedure: WITH SYNOVECTOMY;  Surgeon: Gaynelle Arabian, MD;  Location: [facility];  Service: Orthopedics;  Laterality: Left;   TONSILLECTOMY  1958   TOTAL HIP ARTHROPLASTY Left 11/21/2021   Procedure: TOTAL HIP ARTHROPLASTY ANTERIOR APPROACH;  Surgeon: Rod Can, MD;  Location: [facility];  Service: Orthopedics;  Laterality: Left;   TOTAL KNEE ARTHROPLASTY  02/05/2012   Procedure: TOTAL KNEE ARTHROPLASTY;  Surgeon: Gearlean Alf, MD;  Location: [facility];  Service: Orthopedics;  Laterality: Right;   TOTAL KNEE ARTHROPLASTY Left 02/10/2013   Procedure: LEFT TOTAL KNEE  ARTHROPLASTY;  Surgeon: Gearlean Alf, MD;  Location: [facility];  Service: Orthopedics;  Laterality: Left;   total knee raplacement  01-2012   Right Knee   TOTAL KNEE REVISION Left 07/14/2020   Procedure: TOTAL KNEE REVISION;  Surgeon: Gaynelle Arabian, MD;  Location: [facility];  Service: Orthopedics;  Laterality: Left;   TUBAL LIGATION  1997     A IV Location/Drains/Wounds Patient Lines/Drains/Airways Status     Active Line/Drains/Airways     Name Placement date Placement time Site Days   Peripheral IV 11/29/21 20 G Anterior;Proximal;Right Forearm 11/29/21  1522  Forearm  1   Incision (Closed) 11/21/21 Hip Left 11/21/21  1847  -- 9   Wound / Incision (Open or Dehisced) 11/21/21 Incision - Dehisced Breast Lower;Right reports incision from breast reduction never healed right, fold of skin fissure under right breast. 11/21/21  2130  Breast  9            Intake/Output Last 24 hours No intake or output data in the 24 hours ending 11/30/21 0214  Labs/Imaging Results for orders placed or performed during the hospital encounter of 11/29/21 (from the past 48 hour(s))  Lactic acid, plasma     Status: None   Collection Time: 11/29/21  3:43 PM  Result Value Ref Range   Lactic Acid, Venous 1.1 0.5 - 1.9 mmol/L    Comment: Performed at [facility], [place] [address] [place], [place] [address]  Comprehensive metabolic panel     Status: Abnormal   Collection Time: 11/29/21  3:43 PM  Result Value Ref Range   Sodium 137 135 - 145 mmol/L   Potassium 4.0 3.5 - 5.1 mmol/L   Chloride 104 98 - 111 mmol/L   CO2 27 22 - 32 mmol/L   Glucose, Bld 110 (H) 70 - 99 mg/dL    Comment: Glucose reference range applies only to samples taken after fasting for at least 8 hours.   BUN 18 8 - 23 mg/dL   Creatinine, Ser 0.94 0.44 - 1.00 mg/dL   Calcium 8.8 (L) 8.9 - 10.3 mg/dL   Total Protein 6.0 (L) 6.5 - 8.1 g/dL   Albumin 3.2 (L) 3.5 - 5.0 g/dL   AST 39 15 - 41 U/L   ALT 30 0 - 44 U/L    Alkaline Phosphatase 75 38 - 126 U/L   Total Bilirubin 0.7 0.3 - 1.2 mg/dL   GFR, Estimated >60 >60 mL/min    Comment: (NOTE) Calculated using the CKD-EPI Creatinine Equation (2021)    Anion gap 6 5 - 15    Comment: Performed at [facility], [place] [address], [place], [place] [address]  CBC with Differential     Status: Abnormal   Collection Time: 11/29/21  3:43 PM  Result Value Ref Range   WBC 10.8 (H) 4.0 - 10.5 K/uL   RBC 3.65 (L) 3.87 - 5.11 MIL/uL   Hemoglobin 10.5 (L) 12.0 - 15.0 g/dL   HCT 34.1 (L) 36.0 - 46.0 %   MCV 93.4 80.0 - 100.0 fL   MCH 28.8 26.0 - 34.0 pg   MCHC 30.8 30.0 - 36.0 g/dL   RDW 13.9 11.5 - 15.5 %   Platelets 303 150 - 400 K/uL   nRBC 0.0 0.0 - 0.2 %   Neutrophils Relative % 63 %   Neutro Abs 6.9 1.7 - 7.7 K/uL   Lymphocytes Relative 15 %   Lymphs Abs 1.6 0.7 - 4.0 K/uL   Monocytes Relative 11 %   Monocytes Absolute 1.2 (H) 0.1 - 1.0 K/uL   Eosinophils Relative 8 %   Eosinophils Absolute 0.8 (H) 0.0 - 0.5 K/uL   Basophils Relative 1 %   Basophils Absolute 0.1 0.0 - 0.1 K/uL   Immature Granulocytes 2 %   Abs Immature Granulocytes 0.18 (H) 0.00 - 0.07 K/uL    Comment: Performed at [facility], [place] [address], [place], [place] [address]  Protime-INR     Status: None   Collection Time: 11/29/21  3:43 PM  Result Value Ref Range   Prothrombin Time 14.3 11.4 - 15.2 seconds   INR 1.1 0.8 - 1.2    Comment: (NOTE) INR goal varies based on device and disease states. Performed at [facility], [place] [address], [place], [place] [address]   APTT     Status: None  Collection Time: 11/29/21  3:43 PM  Result Value Ref Range   aPTT 27 24 - 36 seconds    Comment: Performed at [facility], [place] [address], [place], [place] [address]  Troponin I (High Sensitivity)     Status: None   Collection Time: 11/29/21  3:43 PM  Result Value Ref Range   Troponin I (High Sensitivity) 4 <18 ng/L     Comment: (NOTE) Elevated high sensitivity troponin I (hsTnI) values and significant  changes across serial measurements may suggest ACS but many other  chronic and acute conditions are known to elevate hsTnI results.  Refer to the "Links" section for chest pain algorithms and additional  guidance. Performed at [facility], [place] [address], [place], [place] [address]   Resp Panel by RT-PCR (Flu A&B, Covid)     Status: None   Collection Time: 11/29/21  4:07 PM   Specimen: Nasal Swab  Result Value Ref Range   SARS Coronavirus 2 by RT PCR NEGATIVE NEGATIVE    Comment: (NOTE) SARS-CoV-2 target nucleic acids are NOT DETECTED.  The SARS-CoV-2 RNA is generally detectable in upper respiratory specimens during the acute phase of infection. The lowest concentration of SARS-CoV-2 viral copies this assay can detect is 138 copies/mL. A negative result does not preclude SARS-Cov-2 infection and should not be used as the sole basis for treatment or other patient management decisions. A negative result may occur with  improper specimen collection/handling, submission of specimen other than nasopharyngeal swab, presence of viral mutation(s) within the areas targeted by this assay, and inadequate number of viral copies(<138 copies/mL). A negative result must be combined with clinical observations, patient history, and epidemiological information. The expected result is Negative.  Fact Sheet for Patients:  EntrepreneurPulse.com.au  Fact Sheet for Healthcare Providers:  IncredibleEmployment.be  This test is no t yet approved or cleared by the [place] FDA and  has been authorized for detection and/or diagnosis of SARS-CoV-2 by FDA under an Emergency Use Authorization (EUA). This EUA will remain  in effect (meaning this test can be used) for the duration of the COVID-19 declaration under Section 564(b)(1) of the Act, 21 U.S.C.section  360bbb-3(b)(1), unless the authorization is terminated  or revoked sooner.       Influenza A by PCR NEGATIVE NEGATIVE   Influenza B by PCR NEGATIVE NEGATIVE    Comment: (NOTE) The Xpert Xpress SARS-CoV-2/FLU/RSV plus assay is intended as an aid in the diagnosis of influenza from Nasopharyngeal swab specimens and should not be used as a sole basis for treatment. Nasal washings and aspirates are unacceptable for Xpert Xpress SARS-CoV-2/FLU/RSV testing.  Fact Sheet for Patients: EntrepreneurPulse.com.au  Fact Sheet for Healthcare Providers: IncredibleEmployment.be  This test is not yet approved or cleared by the [place] FDA and has been authorized for detection and/or diagnosis of SARS-CoV-2 by FDA under an Emergency Use Authorization (EUA). This EUA will remain in effect (meaning this test can be used) for the duration of the COVID-19 declaration under Section 564(b)(1) of the Act, 21 U.S.C. section 360bbb-3(b)(1), unless the authorization is terminated or revoked.  Performed at [facility], [place] [address], [place], [place] [address]   Urinalysis, Routine w reflex microscopic Urine, Clean Catch     Status: Abnormal   Collection Time: 11/29/21  5:19 PM  Result Value Ref Range   Color, Urine STRAW (A) YELLOW   APPearance CLEAR CLEAR   Specific Gravity, Urine 1.004 (L) 1.005 - 1.030  pH 6.0 5.0 - 8.0   Glucose, UA NEGATIVE NEGATIVE mg/dL   Hgb urine dipstick NEGATIVE NEGATIVE   Bilirubin Urine NEGATIVE NEGATIVE   Ketones, ur NEGATIVE NEGATIVE mg/dL   Protein, ur NEGATIVE NEGATIVE mg/dL   Nitrite NEGATIVE NEGATIVE   Leukocytes,Ua NEGATIVE NEGATIVE    Comment: Performed at [place] [address], [place], [place] [address]  Procalcitonin - Baseline     Status: None   Collection Time: 11/30/21 12:09 AM  Result Value Ref Range   Procalcitonin <0.10 ng/mL    Comment:         Interpretation: PCT (Procalcitonin) <= 0.5 ng/mL: Systemic infection (sepsis) is not likely. Local bacterial infection is possible. (NOTE)       Sepsis PCT Algorithm           Lower Respiratory Tract                                      Infection PCT Algorithm    ----------------------------     ----------------------------         PCT < 0.25 ng/mL                PCT < 0.10 ng/mL          Strongly encourage             Strongly discourage   discontinuation of antibiotics    initiation of antibiotics    ----------------------------     -----------------------------       PCT 0.25 - 0.50 ng/mL            PCT 0.10 - 0.25 ng/mL               OR       >80% decrease in PCT            Discourage initiation of                                            antibiotics      Encourage discontinuation           of antibiotics    ----------------------------     -----------------------------         PCT >= 0.50 ng/mL              PCT 0.26 - 0.50 ng/mL               AND        <80% decrease in PCT             Encourage initiation of                                             antibiotics       Encourage continuation           of antibiotics    ----------------------------     -----------------------------        PCT >= 0.50 ng/mL                  PCT > 0.50 ng/mL               AND  increase in PCT                  Strongly encourage                                      initiation of antibiotics    Strongly encourage escalation           of antibiotics                                     -----------------------------                                           PCT <= 0.25 ng/mL                                                 OR                                        > 80% decrease in PCT                                      Discontinue / Do not initiate                                             antibiotics  Performed at [place] [address] [place], [place] [address]   CK     Status: None   Collection Time: 11/30/21 12:09 AM  Result Value Ref Range   Total CK 102 38 - 234 U/L    Comment: Performed at [facility], [place] [address] [place], [place] [address]  Phosphorus     Status: None   Collection Time: 11/30/21 12:09 AM  Result Value Ref Range   Phosphorus 3.2 2.5 - 4.6 mg/dL    Comment: Performed at [facility], [place] [address], [place], [place] [address]  Magnesium     Status: None   Collection Time: 11/30/21 12:09 AM  Result Value Ref Range   Magnesium 1.8 1.7 - 2.4 mg/dL    Comment: Performed at [facility], [place] [address], [place], [place] [address]   CT Angio Chest PE W/Cm &/Or Wo Cm  Result Date: 11/29/2021 CLINICAL DATA:  Positive D-dimer.  Weakness.  Recent hip surgery. EXAM: CT ANGIOGRAPHY CHEST WITH CONTRAST TECHNIQUE: Multidetector CT imaging of the chest was performed using the standard protocol during bolus administration of intravenous contrast. Multiplanar CT image reconstructions and MIPs were obtained to evaluate the vascular anatomy. RADIATION DOSE REDUCTION: This exam was performed according to the departmental dose-optimization program which includes automated exposure control, adjustment of the mA and/or kV according to patient size and/or use of iterative reconstruction technique. CONTRAST:  35m OMNIPAQUE IOHEXOL 350 MG/ML SOLN  COMPARISON:  CT chest abdomen and pelvis 06/07/2017 FINDINGS: Cardiovascular: There is adequate opacification of the pulmonary arteries. There are segmental and subsegmental right lower lobe pulmonary emboli. Heart and aorta are normal in size. No pericardial effusion. There are atherosclerotic calcifications of the aorta. Mediastinum/Nodes: No enlarged mediastinal, hilar, or axillary lymph nodes. Thyroid gland, trachea, and esophagus demonstrate no significant findings. Lungs/Pleura: There is some atelectasis in the right  middle lobe. There is also some stable scarring in the right middle lobe. Fissural nodule measuring 3 mm in the right lower lobe is favored as intrapulmonary lymph node. This is unchanged from 2019 and favored as benign. No pleural effusion or pneumothorax identified. Upper Abdomen: Cholecystectomy clips are present. Musculoskeletal: T12 vertebroplasty changes again noted. Review of the MIP images confirms the above findings. IMPRESSION: 1. Right lower lobe segmental and subsegmental pulmonary emboli. Right heart strain present with RV to LV ratio 1.1. 2. Right middle lobe atelectasis and scarring. Aortic Atherosclerosis (ICD10-I70.0). Electronically Signed   By: Ronney Asters M.D.   On: 11/29/2021 20:48   VAS [place] LOWER EXTREMITY VENOUS (DVT) (7a-7p)  Result Date: 11/29/2021  Lower Venous DVT Study Patient Name:  JULIEN BERRYMAN  Date of Exam:   11/29/2021 Medical Rec #: [id number]      Accession #:    [id number] Date of Birth: [birth year]       Patient Gender: F Patient Age:   52 years Exam Location:  [facility] Procedure:      VAS [place] LOWER EXTREMITY VENOUS (DVT) Referring Phys: [facility] PFEIFFER --------------------------------------------------------------------------------  Indications: Swelling, and Pain.  Risk Factors: Surgery Left hip fracture with surgical repair (11/21/21). Limitations: Body habitus and poor ultrasound/tissue interface. Comparison Study: Previous exam on 09/21/20 was negative for DVT Performing Technologist: Rogelia Rohrer RVT, RDMS  Examination Guidelines: A complete evaluation includes B-mode imaging, spectral Doppler, color Doppler, and power Doppler as needed of all accessible portions of each vessel. Bilateral testing is considered an integral part of a complete examination. Limited examinations for reoccurring indications may be performed as noted. The reflux portion of the exam is performed with the patient in reverse Trendelenburg.   +-----+---------------+---------+-----------+----------+--------------+ RIGHTCompressibilityPhasicitySpontaneityPropertiesThrombus Aging +-----+---------------+---------+-----------+----------+--------------+ CFV  Full           Yes      Yes                                 +-----+---------------+---------+-----------+----------+--------------+   +---------+---------------+---------+-----------+----------+--------------+ LEFT     CompressibilityPhasicitySpontaneityPropertiesThrombus Aging +---------+---------------+---------+-----------+----------+--------------+ CFV      Full           Yes      Yes                                 +---------+---------------+---------+-----------+----------+--------------+ SFJ      Full                                                        +---------+---------------+---------+-----------+----------+--------------+ FV Prox  Full           Yes      Yes                                 +---------+---------------+---------+-----------+----------+--------------+  FV Mid   Full           Yes      Yes                                 +---------+---------------+---------+-----------+----------+--------------+ FV DistalFull           Yes      Yes                                 +---------+---------------+---------+-----------+----------+--------------+ PFV      Full                                                        +---------+---------------+---------+-----------+----------+--------------+ POP      Full           Yes      Yes                                 +---------+---------------+---------+-----------+----------+--------------+ PTV      Full                                                        +---------+---------------+---------+-----------+----------+--------------+ PERO     Full                                                         +---------+---------------+---------+-----------+----------+--------------+    Summary: RIGHT: - No evidence of common femoral vein obstruction.  LEFT: - There is no evidence of deep vein thrombosis in the lower extremity.  - No cystic structure found in the popliteal fossa.  *See table(s) above for measurements and observations. Electronically signed by Harold Barban MD on 11/29/2021 at 8:45:35 PM.    Final    DG Hip Unilat W or Wo Pelvis 2-3 Views Left  Result Date: 11/29/2021 CLINICAL DATA:  Hip pain 1 week post replacement. Pain and swelling in left hip and leg. EXAM: DG HIP (WITH OR WITHOUT PELVIS) 2-3V LEFT COMPARISON:  Preoperative radiograph 11/21/2021 FINDINGS: Left hip arthroplasty in expected alignment. Cerclage wire fixation about the intertrochanteric femur. No periprosthetic lucency or fracture. Chronic changes about the pubic symphysis. Skin staples in place. Soft tissue edema laterally IMPRESSION: Left hip arthroplasty without complication. Electronically Signed   By: Keith Rake M.D.   On: 11/29/2021 18:39   DG Chest Port 1 View  Result Date: 11/29/2021 CLINICAL DATA:  Question sepsis EXAM: PORTABLE CHEST 1 VIEW COMPARISON:  Chest 11/21/2021 FINDINGS: Improved aeration right lung base.  Left lung clear. Heart size upper normal.  Vascularity normal.  No edema or effusion. IMPRESSION: Improved aeration right lung base. No acute abnormality. Electronically Signed   By: Franchot Gallo M.D.   On: 11/29/2021 16:37    Pending Labs [employer] (From admission, onward)     Start  Ordered   11/30/21 0600  Heparin level (unfractionated)  Once-Timed,   URGENT        11/29/21 2138   11/30/21 0500  Procalcitonin  Daily at 5am,   R      11/29/21 2324   11/30/21 0500  CBC  Tomorrow morning,   R       Question:  Release to patient  Answer:  Immediate   11/30/21 0104   11/30/21 0500  Magnesium  Tomorrow morning,   R        11/30/21 0104   11/30/21 0500  Phosphorus  Tomorrow  morning,   R        11/30/21 0104   11/29/21 1543  Blood Culture (routine x 2)  (Undifferentiated presentation (screening labs and basic nursing orders))  BLOOD CULTURE X 2,   STAT      11/29/21 1543   11/29/21 1543  Urine Culture  (Undifferentiated presentation (screening labs and basic nursing orders))  ONCE - URGENT,   URGENT       Question:  Indication  Answer:  Sepsis   11/29/21 1543   Signed and Held  Comprehensive metabolic panel  Tomorrow morning,   R       Question:  Release to patient  Answer:  Immediate   Signed and Held            Vitals/Pain Today's Vitals   11/29/21 2015 11/29/21 2100 11/29/21 2118 11/29/21 2300  BP: (!) 165/70 122/63  113/64  Pulse: 73 77  73  Resp: (!) 25 (!) 24  20  Temp:   98.2 F (36.8 C)   TempSrc:   Oral   SpO2: 96% 98%  95%  PainSc:        Isolation Precautions No active isolations  Medications Medications  heparin ADULT infusion 100 units/mL (25000 units/260m) (1,100 Units/hr Intravenous New Bag/Given 11/29/21 2157)  donepezil (ARICEPT) tablet 10 mg (10 mg Oral Given 11/30/21 0114)  famotidine (PEPCID) tablet 20 mg (has no administration in time range)  gabapentin (NEURONTIN) capsule 300 mg (300 mg Oral Given 11/30/21 0114)  hydrOXYzine (ATARAX) tablet 25 mg (25 mg Oral Given 11/30/21 0114)  rosuvastatin (CRESTOR) tablet 20 mg (20 mg Oral Given 11/30/21 0115)  traMADol (ULTRAM) tablet 50 mg (has no administration in time range)  venlafaxine XR (EFFEXOR-XR) 24 hr capsule 225 mg (has no administration in time range)  0.9 %  sodium chloride infusion ( Intravenous New Bag/Given 11/30/21 0116)  acetaminophen (TYLENOL) tablet 650 mg (has no administration in time range)    Or  acetaminophen (TYLENOL) suppository 650 mg (has no administration in time range)  HYDROcodone-acetaminophen (NORCO/VICODIN) 5-325 MG per tablet 1-2 tablet (has no administration in time range)  fentaNYL (SUBLIMAZE) injection 12.5-50 mcg (has no administration in time  range)  methocarbamol (ROBAXIN) 500 mg in dextrose 5 % 50 mL IVPB (has no administration in time range)  docusate sodium (COLACE) capsule 100 mg (100 mg Oral Given 11/30/21 0114)  senna (SENOKOT) tablet 8.6 mg (8.6 mg Oral Given 11/30/21 0115)  polyethylene glycol (MIRALAX / GLYCOLAX) packet 17 g (has no administration in time range)  0.9 %  sodium chloride infusion ( Intravenous New Bag/Given 11/29/21 2046)  iohexol (OMNIPAQUE) 350 MG/ML injection 75 mL (75 mLs Intravenous Contrast Given 11/29/21 2022)  heparin bolus via infusion 5,000 Units (5,000 Units Intravenous Bolus from Bag 11/29/21 2201)    Mobility walks High fall risk   Focused Assessments    R Recommendations: See Admitting  Provider Note  Report given to:   Additional Notes:

## 2021-11-30 NOTE — TOC Benefit Eligibility Note (Signed)
Transition of Care (TOC) Benefit Eligibility Note    Patient Details  Name: Shannon [name] MRN: [id number] Date of Birth: 01/29/1946   Medication/Dose: Eliquis 5mg 2x day for 30 days and Xarelto 10 mg x30 days is $29.05  Covered?: Yes  Tier: 2 Drug  Prescription Coverage Preferred Pharmacy: local  Spoke with Person/Company/Phone Number:: Brian/ [employer] [phone]  Co-Pay: Eliquis is $30.03 and Xarelto is $29.05  Prior Approval: No  Deductible: Met       ,  Phone Number: 11/30/2021, 9:01 AM     

## 2021-11-30 NOTE — Progress Notes (Signed)
Mobility Specialist - Progress Note   11/30/21 1048  Mobility  Activity Ambulated with assistance in hallway  Level of Assistance Standby assist, set-up cues, supervision of patient - no hands on  Assistive Device Front wheel walker  Distance Ambulated (ft) 220 ft  Activity Response Tolerated well  Mobility Referral Yes  $Mobility charge 1 Mobility   Pt received in bed and agreed to mobility, no c/o pain nor discomfort. Pt back to chair with all needs met.   Roderick Pee Mobility Specialist

## 2021-11-30 NOTE — Assessment & Plan Note (Signed)
Order CPAP. 

## 2021-11-30 NOTE — Progress Notes (Signed)
[facility]                                                                              Shannon [name], is a 76 y.o. female, DOB - 1945/02/02, IOE:[id number] Admit date - 11/29/2021    Outpatient Primary MD for the patient is Mosie Lukes, MD  LOS - 0  days  Chief Complaint  Patient presents with   Weakness   Post-op Problem   Fatigue       Brief summary   Patient is a 75 year old female with chronic diastolic CHF, HTN, dementia, HLD, GERD, anemia, breast cancer diagnosed in 2014, OSA, osteoporosis, recent mechanical fall with closed left hip fracture status post left total hip arthroplasty on 11/21/2021, was discharged on 10/28 presented from [facility] rehab due to complaints of generalized weakness and fatigue, pain and swelling. Venous Dopplers negative for DVT CTA chest showed right lower lobe segmental and subsegmental pulmonary emboli, right heart strain present with RV to LV ratio 1.1  Patient was admitted for further work-up.  Assessment & Plan    Principal Problem:   Acute right pulmonary embolism ([place]) -Likely due to recent surgery, immobility, has history of breast CA -Patient was started on IV heparin drip -Doppler ultrasounds negative for DVT, follow 2D echo -will place on oral eliquis per pharmacy  Active Problems:   Chronic diastolic CHF (congestive heart failure) ([place]) -Currently appears to be euvolemic  Recent left hip fracture status post hip arthroplasty on 10/23 -Continue pain control, -Now on full dose anticoagulation -PT Evaluation ordered    Essential hypertension -BP currently stable    Alzheimer's dementia without behavioral disturbance ([place]) -Appears to be closer to her baseline    Mixed hyperlipidemia -Continue Crestor 20 mg daily    GERD without esophagitis -cont Pepcid    Postoperative anemia due to acute blood loss -H&H currently stable, baseline 9-10, currently 9.6 -Follow closely with full dose  anticoagulation    Sleep apnea -cont CPAP  Obesity Estimated body mass index is 32.42 kg/m as calculated from the following:   Height as of 11/21/21: '5\' 1"'$  (1.549 m).   Weight as of 11/25/21: 77.8 kg.  Code Status: full  DVT Prophylaxis:   apixaban (ELIQUIS) tablet 10 mg  apixaban (ELIQUIS) tablet 5 mg   Level of Care: Level of care: Telemetry Family Communication:  called patient's daughter, Shannon [name] on phone, unable to make contact, left a detailed voicemail message for update    Disposition Plan:      Remains inpatient appropriate:  on IV heparin drip, can possibly return back to SNF tomorrow   Procedures:  None   Consultants:   None   Antimicrobials: none    Medications  apixaban  10 mg Oral BID   Followed by   Derrill Memo ON 12/07/2021] apixaban  5 mg Oral BID   docusate sodium  100 mg Oral BID   donepezil  10 mg Oral QHS   famotidine  20 mg Oral Daily   gabapentin  300 mg Oral BID   rosuvastatin  20 mg Oral QHS   senna  1 tablet Oral BID  venlafaxine XR  225 mg Oral Daily      Subjective:   Shannon [name] was seen and examined today.  Does complain of pain in her left leg otherwise no acute chest pain or shortness of breath.  No fevers or chills, nausea vomiting, abdominal pain  Objective:   Vitals:   11/30/21 0729 11/30/21 1000 11/30/21 1129 11/30/21 1140  BP: (!) 140/62  139/64 123/71  Pulse: 69  75 73  Resp: '16  18 19  '$ Temp: (!) 97.5 F (36.4 C)  97.9 F (36.6 C) (!) 97.5 F (36.4 C)  TempSrc: Oral  Oral Oral  SpO2: 100% 98% 97%    No intake or output data in the 24 hours ending 11/30/21 1148   Wt Readings from Last 3 Encounters:  11/25/21 77.8 kg  10/04/21 78.5 kg  09/26/21 79.1 kg     Exam General: Alert and oriented x self, place, NAD Cardiovascular: S1 S2 auscultated,  RRR Respiratory: Clear to auscultation bilaterally, no wheezing Gastrointestinal: Soft, nontender, nondistended, + bowel sounds Ext: no pedal edema  bilaterally Neuro: no new FND's, range of motion limited in left leg. Psych: has dementia, appears close to her baseline    Data Reviewed:  I have personally reviewed following labs    CBC Lab Results  Component Value Date   WBC 9.6 11/30/2021   RBC 3.34 (L) 11/30/2021   HGB 9.6 (L) 11/30/2021   HCT 30.9 (L) 11/30/2021   MCV 92.5 11/30/2021   MCH 28.7 11/30/2021   PLT 305 11/30/2021   MCHC 31.1 11/30/2021   RDW 14.1 11/30/2021   LYMPHSABS 1.6 11/29/2021   MONOABS 1.2 (H) 11/29/2021   EOSABS 0.8 (H) 11/29/2021   BASOSABS 0.1 84/16/6063     Last metabolic panel Lab Results  Component Value Date   NA 137 11/29/2021   K 4.0 11/29/2021   CL 104 11/29/2021   CO2 27 11/29/2021   BUN 18 11/29/2021   CREATININE 0.94 11/29/2021   GLUCOSE 110 (H) 11/29/2021   GFRNONAA >60 11/29/2021   GFRAA >60 12/14/2017   CALCIUM 8.8 (L) 11/29/2021   PHOS 3.5 11/30/2021   PROT 6.0 (L) 11/29/2021   ALBUMIN 3.2 (L) 11/29/2021   BILITOT 0.7 11/29/2021   ALKPHOS 75 11/29/2021   AST 39 11/29/2021   ALT 30 11/29/2021   ANIONGAP 6 11/29/2021    CBG (last 3)  No results for input(s): "GLUCAP" in the last 72 hours.    Coagulation Profile: Recent Labs  Lab 11/29/21 1543  INR 1.1     Radiology Studies: I have personally reviewed the imaging studies  CT Angio Chest PE W/Cm &/Or Wo Cm  Result Date: 11/29/2021 CLINICAL DATA:  Positive D-dimer.  Weakness.  Recent hip surgery. EXAM: CT ANGIOGRAPHY CHEST WITH CONTRAST TECHNIQUE: Multidetector CT imaging of the chest was performed using the standard protocol during bolus administration of intravenous contrast. Multiplanar CT image reconstructions and MIPs were obtained to evaluate the vascular anatomy. RADIATION DOSE REDUCTION: This exam was performed according to the departmental dose-optimization program which includes automated exposure control, adjustment of the mA and/or kV according to patient size and/or use of iterative reconstruction  technique. CONTRAST:  62m OMNIPAQUE IOHEXOL 350 MG/ML SOLN COMPARISON:  CT chest abdomen and pelvis 06/07/2017 FINDINGS: Cardiovascular: There is adequate opacification of the pulmonary arteries. There are segmental and subsegmental right lower lobe pulmonary emboli. Heart and aorta are normal in size. No pericardial effusion. There are atherosclerotic calcifications of the aorta. Mediastinum/Nodes: No enlarged  mediastinal, hilar, or axillary lymph nodes. Thyroid gland, trachea, and esophagus demonstrate no significant findings. Lungs/Pleura: There is some atelectasis in the right middle lobe. There is also some stable scarring in the right middle lobe. Fissural nodule measuring 3 mm in the right lower lobe is favored as intrapulmonary lymph node. This is unchanged from 2019 and favored as benign. No pleural effusion or pneumothorax identified. Upper Abdomen: Cholecystectomy clips are present. Musculoskeletal: T12 vertebroplasty changes again noted. Review of the MIP images confirms the above findings. IMPRESSION: 1. Right lower lobe segmental and subsegmental pulmonary emboli. Right heart strain present with RV to LV ratio 1.1. 2. Right middle lobe atelectasis and scarring. Aortic Atherosclerosis (ICD10-I70.0). Electronically Signed   By: Ronney Asters M.D.   On: 11/29/2021 20:48   VAS [place] LOWER EXTREMITY VENOUS (DVT) (7a-7p)  Result Date: 11/29/2021  Lower Venous DVT Study Patient Name:  Shannon [name]  Date of Exam:   11/29/2021 Medical Rec #: [id number]      Accession #:    [id number] Date of Birth: 11-15-1945       Patient Gender: F Patient Age:   52 years Exam Location:  [facility] Procedure:      VAS [place] LOWER EXTREMITY VENOUS (DVT) Referring Phys: [facility] PFEIFFER --------------------------------------------------------------------------------  Indications: Swelling, and Pain.  Risk Factors: Surgery Left hip fracture with surgical repair (11/21/21). Limitations: Body habitus and poor  ultrasound/tissue interface. Comparison Study: Previous exam on 09/21/20 was negative for DVT Performing Technologist: Rogelia Rohrer RVT, RDMS  Examination Guidelines: A complete evaluation includes B-mode imaging, spectral Doppler, color Doppler, and power Doppler as needed of all accessible portions of each vessel. Bilateral testing is considered an integral part of a complete examination. Limited examinations for reoccurring indications may be performed as noted. The reflux portion of the exam is performed with the patient in reverse Trendelenburg.  +-----+---------------+---------+-----------+----------+--------------+ RIGHTCompressibilityPhasicitySpontaneityPropertiesThrombus Aging +-----+---------------+---------+-----------+----------+--------------+ CFV  Full           Yes      Yes                                 +-----+---------------+---------+-----------+----------+--------------+   +---------+---------------+---------+-----------+----------+--------------+ LEFT     CompressibilityPhasicitySpontaneityPropertiesThrombus Aging +---------+---------------+---------+-----------+----------+--------------+ CFV      Full           Yes      Yes                                 +---------+---------------+---------+-----------+----------+--------------+ SFJ      Full                                                        +---------+---------------+---------+-----------+----------+--------------+ FV Prox  Full           Yes      Yes                                 +---------+---------------+---------+-----------+----------+--------------+ FV Mid   Full           Yes      Yes                                 +---------+---------------+---------+-----------+----------+--------------+  FV DistalFull           Yes      Yes                                 +---------+---------------+---------+-----------+----------+--------------+ PFV      Full                                                         +---------+---------------+---------+-----------+----------+--------------+ POP      Full           Yes      Yes                                 +---------+---------------+---------+-----------+----------+--------------+ PTV      Full                                                        +---------+---------------+---------+-----------+----------+--------------+ PERO     Full                                                        +---------+---------------+---------+-----------+----------+--------------+    Summary: RIGHT: - No evidence of common femoral vein obstruction.  LEFT: - There is no evidence of deep vein thrombosis in the lower extremity.  - No cystic structure found in the popliteal fossa.  *See table(s) above for measurements and observations. Electronically signed by Harold Barban MD on 11/29/2021 at 8:45:35 PM.    Final    DG Hip Unilat W or Wo Pelvis 2-3 Views Left  Result Date: 11/29/2021 CLINICAL DATA:  Hip pain 1 week post replacement. Pain and swelling in left hip and leg. EXAM: DG HIP (WITH OR WITHOUT PELVIS) 2-3V LEFT COMPARISON:  Preoperative radiograph 11/21/2021 FINDINGS: Left hip arthroplasty in expected alignment. Cerclage wire fixation about the intertrochanteric femur. No periprosthetic lucency or fracture. Chronic changes about the pubic symphysis. Skin staples in place. Soft tissue edema laterally IMPRESSION: Left hip arthroplasty without complication. Electronically Signed   By: Keith Rake M.D.   On: 11/29/2021 18:39   DG Chest Port 1 View  Result Date: 11/29/2021 CLINICAL DATA:  Question sepsis EXAM: PORTABLE CHEST 1 VIEW COMPARISON:  Chest 11/21/2021 FINDINGS: Improved aeration right lung base.  Left lung clear. Heart size upper normal.  Vascularity normal.  No edema or effusion. IMPRESSION: Improved aeration right lung base. No acute abnormality. Electronically Signed   By: Franchot Gallo M.D.   On: 11/29/2021 16:37        Gwenneth Whiteman M.D. [facility] 11/30/2021, 11:48 AM  Available via Epic secure chat 7am-7pm After 7 pm, please refer to night coverage provider listed on amion.

## 2021-11-30 NOTE — Progress Notes (Addendum)
ANTICOAGULATION CONSULT NOTE - Follow Up Consult  Pharmacy Consult for heparin Indication: acute pulmonary embolus  Allergies  Allergen Reactions   Dilaudid [Hydromorphone Hcl] Itching and Other (See Comments)    "Went Crazy"     Patient Measurements: weight 78 kg, ht 61 inches   Heparin Dosing Weight: 65 kg  Vital Signs: Temp: 98.2 F (36.8 C) (11/01 0319) Temp Source: Oral (11/01 0340) BP: 159/65 (11/01 0319) Pulse Rate: 74 (11/01 0340)  Labs: Recent Labs    11/29/21 1543 11/30/21 0009 11/30/21 0518  HGB 10.5*  --  9.6*  HCT 34.1*  --  30.9*  PLT 303  --  305  APTT 27  --   --   LABPROT 14.3  --   --   INR 1.1  --   --   HEPARINUNFRC  --   --  0.64  CREATININE 0.94  --   --   CKTOTAL  --  102  --   TROPONINIHS 4  --   --     Estimated Creatinine Clearance: 47.3 mL/min (by C-G formula based on SCr of 0.94 mg/dL).  Assessment: Patient is a 76 y.o F who was recently hospitalized from 11/21/21 - 11/26/21 after she fell and was found to have a displaced left femoral neck fracture.  She underwent left THA on 11/21/21 and discharged to SNF with asa 81 mg bid for VTE prophylaxis. She presented back to the ED on 11/29/21 with c/o dizziness, generalized weakness and left leg/hip swelling.  Chest CT on 10/31 was positive for acute right lower lobe segmental and subsegmental pulmonary emboli with evidence of RHS.  LE doppler was negative for DVT.  She is currently on heparin drip for PE treatment.  Today, 11/30/2021: - first heparin level is therapeutic at 0.64 - hgb down slightly to 9.6, plts ok - no bleeding documented  Goal of Therapy:  Heparin level 0.3-0.7 units/ml Monitor platelets by anticoagulation protocol: Yes   Plan:  - continue heparin drip at 1100 units/hr - re-check heparin level at 1pm to ensure level is still therapeutic before changing to daily monitoring - monitor for s/sx bleeding  Jansen Sciuto P 11/30/2021,7:18  AM  __________________________________  Adden:  Per Dr. Tana Coast, transition pt to Eliquis - d/c heparin drip  - start Eliquis 10 mg bid x7 days, then 5 mg bid - pharmacy will sign off for Eliquis but will monitor patient peripherally along with you.  Dia Sitter, PharmD, BCPS 11/30/2021 11:47 AM

## 2021-11-30 NOTE — TOC Initial Note (Signed)
Transition of Care [facility]) - Initial/Assessment Note    Patient Details  Name: Shannon [name] MRN: [id number] Date of Birth: [birth year]  Transition of [facility]) CM/SW Contact:    Vassie Moselle, LCSW Phone Number: 11/30/2021, 12:16 PM  Clinical Narrative:                 Spoke with pt and pt's daughter at bedside and confirmed plan to return to [facility] at discharge. Pt and daughter aware plan is for discharge tomorrow and are agreeable to this.  CSW contacted [facility] and spoke with Judson Roch who confirmed pt is able to return to their facility tomorrow. FL-2 to be completed and faxed to facility prior to discharge.   Expected Discharge Plan: Skilled Nursing Facility Barriers to Discharge: No Barriers Identified   Patient Goals and CMS Choice Patient states their goals for this hospitalization and ongoing recovery are:: To return to SNF CMS Medicare.gov Compare Post Acute Care list provided to:: Patient Choice offered to / list presented to : Patient, Adult Children  Expected Discharge Plan and Services Expected Discharge Plan: [place] In-house Referral: Clinical Social Work Discharge Planning Services: CM Consult Post Acute Care Choice: [place] Living arrangements for the past 2 months: Single Family Home                 DME Arranged: N/A DME Agency: NA                  Prior Living Arrangements/Services Living arrangements for the past 2 months: Single Family Home Lives with:: Spouse Patient language and need for interpreter reviewed:: Yes Do you feel safe going back to the place where you live?: Yes      Need for Family Participation in Patient Care: No (Comment) Care giver support system in place?: Yes (comment) Current home services: DME Criminal Activity/Legal Involvement Pertinent to Current Situation/Hospitalization: No - Comment as needed  Activities of Daily Living Home Assistive Devices/Equipment:  CPAP, Walker (specify type) ADL Screening (condition at time of admission) Patient's cognitive ability adequate to safely complete daily activities?: Yes Is the patient deaf or have difficulty hearing?: No Does the patient have difficulty seeing, even when wearing glasses/contacts?: No Does the patient have difficulty concentrating, remembering, or making decisions?: No Patient able to express need for assistance with ADLs?: Yes Does the patient have difficulty dressing or bathing?: No Independently performs ADLs?: Yes (appropriate for developmental age) Communication: Independent Dressing (OT): Independent Grooming: Independent Feeding: Independent Bathing: Independent Toileting: Independent Is this a change from baseline?: Pre-admission baseline In/Out Bed: Independent with device (comment) (walker) Is this a change from baseline?: Pre-admission baseline Walks in Home: Independent with device (comment) (walker) Is this a change from baseline?: Pre-admission baseline Does the patient have difficulty walking or climbing stairs?: Yes Weakness of Legs: Left Weakness of Arms/Hands: None  Permission Sought/Granted Permission sought to share information with : Family Supports, Chartered certified accountant granted to share information with : Yes, Verbal Permission Granted  Share Information with NAME: Buddy Duty     Permission granted to share info w Relationship: daughter  Permission granted to share info w Contact Information: [phone]  Emotional Assessment Appearance:: Appears stated age Attitude/Demeanor/Rapport: Engaged, Charismatic Affect (typically observed): Accepting, Happy Orientation: : Oriented to Self, Oriented to Place, Oriented to  Time, Oriented to Situation Alcohol / Substance Use: Not Applicable Psych Involvement: No (comment)  Admission diagnosis:  Pulmonary embolism ([place]) [I26.99] Patient Active Problem List  Diagnosis Date Noted    Acute pulmonary embolism ([place]) 11/29/2021   Chronic diastolic CHF (congestive heart failure) ([place]) 11/23/2021   Closed left hip fracture, initial encounter ([place]) 11/21/2021   Grade I diastolic dysfunction 38/93/7342   Fall 11/21/2021   Alzheimer's dementia without behavioral disturbance ([place]) 03/02/2021   Sleep apnea 01/12/2021   Depression with anxiety 01/12/2021   Mild neurocognitive disorder due to Alzheimer's disease 12/01/2020   History of melanoma 11/17/2020   Palpitation 10/28/2020   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   Right knee pain 07/10/2020   Urinary frequency 02/11/2020   Debility 12/23/2017   Left knee pain 12/23/2017   Vitamin D deficiency 06/14/2017   Vertebral fracture, osteoporotic 04/05/2016   Left-sided back pain 03/06/2016   Chest pain 02/27/2016   Syncope 02/27/2016   Cataracts, bilateral 12/28/2015   Recurrent falls 12/28/2015   Family history- stomach cancer 09/08/2015   Family history of cancer 09/08/2015   [name] lesion 06/21/2015   Left leg swelling 05/24/2015   Obesity 09/28/2014   Interstitial cystitis 09/28/2014   Pain in joint, ankle and foot 09/28/2014   Abnormal nuclear stress test 12/16/2013   Sternal pain 11/06/2013   Postoperative anemia due to acute blood loss 02/13/2013   Congestive dilated cardiomyopathy ([place]) 12/30/2012   Breast cancer of lower-inner quadrant of right female breast 10/21/2012   Constipation 03/03/2012   OA (osteoarthritis) 09/07/2011   Dermatitis    Hyponatremia 08/28/2011   Osteoporosis 03/07/2011   Prediabetes 06/15/2009   Lipoma 02/10/2009   Dyspnea on exertion 02/10/2009   Mixed hyperlipidemia 08/11/2008   Major depressive disorder 08/11/2008   Essential hypertension 08/11/2008   GERD without esophagitis 08/11/2008   Craniopharyngioma 2000   PCP:  Mosie Lukes, MD Pharmacy:   [id number] [place], [address]  [address]Phone: [phone]: [phone] [facility] [id number]: [phone]: [phone]    Social Determinants of Health (SDOH) Interventions    Readmission Risk Interventions    11/23/2021    2:35 PM  Readmission Risk Prevention Plan  Transportation Screening Complete  PCP or Specialist Appt within 5-7 Days Complete  Home Care Screening Complete  Medication Review (RN CM) Complete

## 2021-11-30 NOTE — Progress Notes (Signed)
PT refused CPAP.  

## 2021-11-30 NOTE — Discharge Instructions (Addendum)
Information on my medicine - ELIQUIS (apixaban)  This medication education was reviewed with me or my healthcare representative as part of my discharge preparation.    Why was Eliquis prescribed for you? Eliquis was prescribed to treat blood clots that may have been found in the veins of your legs (deep vein thrombosis) or in your lungs (pulmonary embolism) and to reduce the risk of them occurring again.  What do You need to know about Eliquis ? The starting dose is 10 mg (two 5 mg tablets) taken TWICE daily for the FIRST SEVEN (7) DAYS, then on 12/07/21  the dose is reduced to ONE 5 mg tablet taken TWICE daily.  Eliquis may be taken with or without food.   Try to take the dose about the same time in the morning and in the evening. If you have difficulty swallowing the tablet whole please discuss with your pharmacist how to take the medication safely.  Take Eliquis exactly as prescribed and DO NOT stop taking Eliquis without talking to the doctor who prescribed the medication.  Stopping may increase your risk of developing a new blood clot.  Refill your prescription before you run out.  After discharge, you should have regular check-up appointments with your healthcare provider that is prescribing your Eliquis.    What do you do if you miss a dose? If a dose of ELIQUIS is not taken at the scheduled time, take it as soon as possible on the same day and twice-daily administration should be resumed. The dose should not be doubled to make up for a missed dose.  Important Safety Information A possible side effect of Eliquis is bleeding. You should call your healthcare provider right away if you experience any of the following: Bleeding from an injury or your nose that does not stop. Unusual colored urine (red or dark brown) or unusual colored stools (red or black). Unusual bruising for unknown reasons. A serious fall or if you hit your head (even if there is no bleeding).  Some  medicines may interact with Eliquis and might increase your risk of bleeding or clotting while on Eliquis. To help avoid this, consult your healthcare provider or pharmacist prior to using any new prescription or non-prescription medications, including herbals, vitamins, non-steroidal anti-inflammatory drugs (NSAIDs) and supplements.  This website has more information on Eliquis (apixaban): http://www.eliquis.com/eliquis/home

## 2021-11-30 NOTE — Consult Note (Addendum)
Patient ID: Shannon [name] MRN: [id number] DOB/AGE: December 21, 1945 76 y.o.  Admit date: 11/29/2021  Admission Diagnoses:  Principal Problem:   Acute pulmonary embolism ([place]) Active Problems:   Mixed hyperlipidemia   Essential hypertension   GERD without esophagitis   Postoperative anemia due to acute blood loss   Left leg swelling   Sleep apnea   Alzheimer's dementia without behavioral disturbance (HCC)   Chronic diastolic CHF (congestive heart failure) (HCC)   HPI: Ortho consult for postop check s/p left total hip arthroplasty for femoral neck fracture (Dr. Aaron Edelman Swinteck DOS 11/21/21) with ongoing admission for pulmonary embolism. The patient was admitted postop to SNF where she complained of severe malaise. CT PE in the ER demonstrated pulmonary embolus causing right heart strain. She was then started on heparin infusion. She denies numbness/tingling/weakness. She denies any new pain in the newly replaced left hip.  PMH notable for chronic diastolic CHF  HTN, dementia, HLD, GERD, anemia, breast cancer diagnosed in 2014, sleep apnea history of melanoma congestive dilated cardiomyopathy constipation osteoporosis prediabetes.  The patient is an independent community ambulator without assistive device.  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 Echo 2012: EF 45-50%;  b. Lex MV 2/12:  low risk, apical defect (small area of ischemia vs shifting breast atten);  c.  Echo 7/12: Normal wall thickness, EF 60-65%, normal wall motion, grade 1 diastolic dysfunction, mild LAE, PASP 32;   d. Lex MV 11/13:  EF 76%, no ischemia   OA (osteoarthritis) 09/07/2011   Obesity 09/28/2014   Osteoporosis 03/07/2011   DEXA T score -2.6 AP spine 03/07/11    Formatting of this note might be different from the original. Formatting of this note might be different from the original. DEXA T score -2.6 AP spine 03/07/11   Last Assessment & Plan:  Formatting of this note might be different from the original. Encouraged to get adequate exercise, calcium and vitamin d intake   Pain in joint, ankle and foot 09/28/2014  Palpitation 10/28/2020   Personal history of chemotherapy 2012   Personal history of radiation therapy 2012   Prediabetes 06/15/2009   Recurrent falls 12/28/2015   Right knee pain 07/10/2020   Shortness of breath    UTI (urinary tract infection) 03/03/2012   Vertebral fracture, osteoporotic, sequela 04/05/2016   Vitamin D deficiency 06/14/2017   Supplement and monitor    Surgical History: Past Surgical History:   Procedure Laterality Date   BREAST LUMPECTOMY  04/2009   RIGHT FOR BREAST CANCER-CHEMO/RADIATION X 1 YEAR   BREAST REDUCTION SURGERY Bilateral 05/04/2014   Procedure: MAMMARY REDUCTION  (BREAST);  Surgeon: Cristine Polio, MD;  Location: [facility];  Service: Plastics;  Laterality: Bilateral;   CARPAL TUNNEL RELEASE     L wrist, ulnar nerve moved   CHOLECYSTECTOMY     CRANIOTOMY FOR TUMOR  2000   ELBOW SURGERY     left   KNEE ARTHROSCOPY Left 10/12/2014   Procedure: LEFT KNEE ARTHROSCOPY ;  Surgeon: Gaynelle Arabian, MD;  Location: [facility];  Service: Orthopedics;  Laterality: Left;   KYPHOPLASTY N/A 03/30/2016   Procedure: THORACIC 12 KYPHOPLASTY;  Surgeon: Phylliss Bob, MD;  Location: [facility];  Service: Orthopedics;  Laterality: N/A;  THORACIC 12 KYPHOPLASTY   LEFT HEART CATHETERIZATION WITH CORONARY ANGIOGRAM N/A 12/16/2013   Procedure: LEFT HEART CATHETERIZATION WITH CORONARY ANGIOGRAM;  Surgeon: Peter M [name], MD;  Location: [facility];  Service: Cardiovascular;  Laterality: N/A;   LIPOMA EXCISION  03/28/2009   right leg   MELANOMA EXCISION     PORT-A-CATH REMOVAL  11/30/2010   [place]reck   porta cath     PORTACATH PLACEMENT  may 2011   REDUCTION MAMMAPLASTY Bilateral    SYNOVECTOMY Left 10/12/2014   Procedure: WITH SYNOVECTOMY;  Surgeon: Gaynelle Arabian, MD;  Location: [facility];  Service: Orthopedics;  Laterality: Left;   TONSILLECTOMY  1958   TOTAL HIP ARTHROPLASTY Left 11/21/2021   Procedure: TOTAL HIP ARTHROPLASTY ANTERIOR APPROACH;  Surgeon: Rod Can, MD;  Location: [facility];  Service: Orthopedics;  Laterality: Left;   TOTAL KNEE ARTHROPLASTY  02/05/2012   Procedure: TOTAL KNEE ARTHROPLASTY;  Surgeon: Gearlean Alf, MD;  Location: [facility];  Service: Orthopedics;  Laterality: Right;   TOTAL KNEE ARTHROPLASTY Left 02/10/2013   Procedure: LEFT TOTAL KNEE ARTHROPLASTY;  Surgeon: Gearlean Alf, MD;  Location: [facility];  Service: Orthopedics;  Laterality: Left;   total knee  raplacement  01-2012   Right Knee   TOTAL KNEE REVISION Left 07/14/2020   Procedure: TOTAL KNEE REVISION;  Surgeon: Gaynelle Arabian, MD;  Location: [facility];  Service: Orthopedics;  Laterality: Left;   TUBAL LIGATION  1997    Family History: Family History  Problem Relation Age of Onset   Breast cancer Mother        sarcoma   Lung cancer Mother    Hypertension Mother    Prostate cancer Father    Congestive Heart Failure Father    Heart attack Father    Prostate cancer Brother    Heart disease Maternal Grandfather        MI   Down syndrome Son    CVA Son    Stomach cancer Maternal Aunt    Dementia Maternal Aunt    Uterine cancer Maternal Aunt    Colon cancer Neg Hx     Social History: Social History   Socioeconomic History   Marital status: Married    Spouse name: Adalyne Lovick   Number  of children: 3   Years of education: 12   Highest education level: High school graduate  Occupational History   Occupation: boutique owner-retired    Employer: [employer]  Tobacco Use   Smoking status: Never    Passive exposure: Never   Smokeless tobacco: Never  Vaping Use   Vaping Use: Never used  Substance and Sexual Activity   Alcohol use: No   Drug use: No   Sexual activity: Not Currently  Other Topics Concern   Not on file  Social History Narrative   Patient is right-handed. She lives with her husband in a 4 story home. They take care of their grown son with down syndrome who has had a stroke. She drinks 1-2 glasses of tea in the evenings. She does not formally exercise.      Social Determinants of Health   Financial Resource Strain: Low Risk  (07/25/2021)   Overall Financial Resource Strain (CARDIA)    Difficulty of Paying Living Expenses: Not very hard  Food Insecurity: No Food Insecurity (11/21/2021)   Hunger Vital Sign    Worried About Running Out of Food in the Last Year: Never true    Ran Out of Food in the Last Year: Never true  Transportation Needs: No  Transportation Needs (11/21/2021)   PRAPARE - Hydrologist (Medical): No    Lack of Transportation (Non-Medical): No  Physical Activity: Inactive (12/30/2020)   Exercise Vital Sign    Days of Exercise per Week: 0 days    Minutes of Exercise per Session: 0 min  Stress: Stress Concern Present (07/25/2021)   [place]    Feeling of Stress : Rather much  Social Connections: Socially Integrated (12/30/2020)   Social Connection and Isolation Panel [NHANES]    Frequency of Communication with Friends and Family: More than three times a week    Frequency of Social Gatherings with Friends and Family: More than three times a week    Attends Religious Services: 1 to 4 times per year    Active Member of [employer] or Organizations: Yes    Attends Archivist Meetings: 1 to 4 times per year    Marital Status: Married  Human resources officer Violence: Not At Risk (11/21/2021)   Humiliation, Afraid, Rape, and Kick questionnaire    Fear of Current or Ex-Partner: No    Emotionally Abused: No    Physically Abused: No    Sexually Abused: No    Allergies: Dilaudid [hydromorphone hcl]  Medications: I have reviewed the patient's current medications.  Vital Signs: Patient Vitals for the past 24 hrs:  BP Temp Temp src Pulse Resp SpO2  11/30/21 1527 137/72 98.1 F (36.7 C) Oral 74 18 99 %  11/30/21 1140 123/71 (!) 97.5 F (36.4 C) Oral 73 19 --  11/30/21 1129 139/64 97.9 F (36.6 C) Oral 75 18 97 %  11/30/21 1000 -- -- -- -- -- 98 %  11/30/21 0729 (!) 140/62 (!) 97.5 F (36.4 C) Oral 69 16 100 %  11/30/21 0340 -- -- Oral 74 14 97 %  11/30/21 0339 -- -- -- 74 14 97 %  11/30/21 0319 (!) 159/65 98.2 F (36.8 C) Oral 74 14 97 %  11/29/21 2300 113/64 -- -- 73 20 95 %  11/29/21 2118 -- 98.2 F (36.8 C) Oral -- -- --  11/29/21 2100 122/63 -- -- 77 (!) 24 98 %  11/29/21 2015 (!) 165/70 -- --  73 (!) 25 96 %   11/29/21 1930 136/70 -- -- 85 (!) 21 99 %  11/29/21 1745 (!) 153/60 -- -- 65 19 99 %  11/29/21 1652 (!) 145/60 98 F (36.7 C) Oral 67 16 97 %    Radiology: CT Angio Chest PE W/Cm &/Or Wo Cm  Result Date: 11/29/2021 CLINICAL DATA:  Positive D-dimer.  Weakness.  Recent hip surgery. EXAM: CT ANGIOGRAPHY CHEST WITH CONTRAST TECHNIQUE: Multidetector CT imaging of the chest was performed using the standard protocol during bolus administration of intravenous contrast. Multiplanar CT image reconstructions and MIPs were obtained to evaluate the vascular anatomy. RADIATION DOSE REDUCTION: This exam was performed according to the departmental dose-optimization program which includes automated exposure control, adjustment of the mA and/or kV according to patient size and/or use of iterative reconstruction technique. CONTRAST:  19m OMNIPAQUE IOHEXOL 350 MG/ML SOLN COMPARISON:  CT chest abdomen and pelvis 06/07/2017 FINDINGS: Cardiovascular: There is adequate opacification of the pulmonary arteries. There are segmental and subsegmental right lower lobe pulmonary emboli. Heart and aorta are normal in size. No pericardial effusion. There are atherosclerotic calcifications of the aorta. Mediastinum/Nodes: No enlarged mediastinal, hilar, or axillary lymph nodes. Thyroid gland, trachea, and esophagus demonstrate no significant findings. Lungs/Pleura: There is some atelectasis in the right middle lobe. There is also some stable scarring in the right middle lobe. Fissural nodule measuring 3 mm in the right lower lobe is favored as intrapulmonary lymph node. This is unchanged from 2019 and favored as benign. No pleural effusion or pneumothorax identified. Upper Abdomen: Cholecystectomy clips are present. Musculoskeletal: T12 vertebroplasty changes again noted. Review of the MIP images confirms the above findings. IMPRESSION: 1. Right lower lobe segmental and subsegmental pulmonary emboli. Right heart strain present with RV  to LV ratio 1.1. 2. Right middle lobe atelectasis and scarring. Aortic Atherosclerosis (ICD10-I70.0). Electronically Signed   By: ARonney AstersM.D.   On: 11/29/2021 20:48   VAS [place]OWER EXTREMITY VENOUS (DVT) (7a-7p)  Result Date: 11/29/2021  Lower Venous DVT Study Patient Name:  JSCOTLYNN NOYES Date of Exam:   11/29/2021 Medical Rec #: [id number]     Accession #:    [id number] of Birth: 5Jan 03, 1947      Patient Gender: F Patient Age:   89years [facility]      [facility] [place]OWER EXTREMITY VENOUS (DVT) Referring Phys: MShepherd CenterPFEIFFER --------------------------------------------------------------------------------  Indications: Swelling, and Pain.  Risk Factors: Surgery Left hip fracture with surgical repair (11/21/21). Limitations: Body habitus and poor ultrasound/tissue interface. Comparison Study: Previous exam on 09/21/20 was negative for DVT Performing Technologist: JRogelia RohrerRVT, RDMS  Examination Guidelines: A complete evaluation includes B-mode imaging, spectral Doppler, color Doppler, and power Doppler as needed of all accessible portions of each vessel. Bilateral testing is considered an integral part of a complete examination. Limited examinations for reoccurring indications may be performed as noted. The reflux portion of the exam is performed with the patient in reverse Trendelenburg.  +-----+---------------+---------+-----------+----------+--------------+ RIGHTCompressibilityPhasicitySpontaneityPropertiesThrombus Aging +-----+---------------+---------+-----------+----------+--------------+ CFV  Full           Yes      Yes                                 +-----+---------------+---------+-----------+----------+--------------+   +---------+---------------+---------+-----------+----------+--------------+ LEFT     CompressibilityPhasicitySpontaneityPropertiesThrombus Aging +---------+---------------+---------+-----------+----------+--------------+  CFV      Full  Yes      Yes                                 +---------+---------------+---------+-----------+----------+--------------+ SFJ      Full                                                        +---------+---------------+---------+-----------+----------+--------------+ FV Prox  Full           Yes      Yes                                 +---------+---------------+---------+-----------+----------+--------------+ FV Mid   Full           Yes      Yes                                 +---------+---------------+---------+-----------+----------+--------------+ FV DistalFull           Yes      Yes                                 +---------+---------------+---------+-----------+----------+--------------+ PFV      Full                                                        +---------+---------------+---------+-----------+----------+--------------+ POP      Full           Yes      Yes                                 +---------+---------------+---------+-----------+----------+--------------+ PTV      Full                                                        +---------+---------------+---------+-----------+----------+--------------+ PERO     Full                                                        +---------+---------------+---------+-----------+----------+--------------+    Summary: RIGHT: - No evidence of common femoral vein obstruction.  LEFT: - There is no evidence of deep vein thrombosis in the lower extremity.  - No cystic structure found in the popliteal fossa.  *See table(s) above for measurements and observations. Electronically signed by Harold Barban MD on 11/29/2021 at 8:45:35 PM.    Final    DG Hip Unilat W or Wo Pelvis 2-3 Views Left  Result Date: 11/29/2021 CLINICAL DATA:  Hip pain 1 week post replacement. Pain and swelling in  left hip and leg. EXAM: DG HIP (WITH OR WITHOUT PELVIS) 2-3V LEFT COMPARISON:  Preoperative  radiograph 11/21/2021 FINDINGS: Left hip arthroplasty in expected alignment. Cerclage wire fixation about the intertrochanteric femur. No periprosthetic lucency or fracture. Chronic changes about the pubic symphysis. Skin staples in place. Soft tissue edema laterally IMPRESSION: Left hip arthroplasty without complication. Electronically Signed   By: Keith Rake M.D.   On: 11/29/2021 18:39   DG Chest Port 1 View  Result Date: 11/29/2021 CLINICAL DATA:  Question sepsis EXAM: PORTABLE CHEST 1 VIEW COMPARISON:  Chest 11/21/2021 FINDINGS: Improved aeration right lung base.  Left lung clear. Heart size upper normal.  Vascularity normal.  No edema or effusion. IMPRESSION: Improved aeration right lung base. No acute abnormality. Electronically Signed   By: Franchot Gallo M.D.   On: 11/29/2021 16:37   DG HIP PORT UNILAT WITH PELVIS 1V LEFT  Result Date: 11/21/2021 CLINICAL DATA:  Left hip fracture, left hip arthroplasty EXAM: DG HIP (WITH OR WITHOUT PELVIS) 1V PORT LEFT COMPARISON:  11/21/2021 FINDINGS: Nine fluoroscopic images are obtained during the performance of the procedure and are provided for interpretation only. Left femoral neck fracture is identified, with subsequent placement of a left hip arthroplasty in the expected position with no signs of acute complication. A cerclage wire surrounds the femoral component of the arthroplasty. Please refer to operative report. Fluoroscopy time: 11 seconds, 1.53 mGy IMPRESSION: 1. Left hip arthroplasty as above. Electronically Signed   By: Randa Ngo M.D.   On: 11/21/2021 19:58   DG Pelvis Portable  Result Date: 11/21/2021 CLINICAL DATA:  Left hip arthroplasty, left hip fracture EXAM: PORTABLE PELVIS 1-2 VIEWS COMPARISON:  11/21/2021 FINDINGS: Frontal view of the bilateral hips was performed. Superior pelvis is excluded by collimation. Interval placement of a left hip arthroplasty, with cerclage wire surrounding the femoral component of the  arthroplasty. Alignment is anatomic. Postsurgical changes are seen within the overlying soft tissues. Visualized portions of the pelvis and right hip are stable. IMPRESSION: 1. Left hip arthroplasty as above.  No signs of acute complication. Electronically Signed   By: Randa Ngo M.D.   On: 11/21/2021 19:57   DG C-Arm 1-60 Min-No Report  Result Date: 11/21/2021 Fluoroscopy was utilized by the requesting physician.  No radiographic interpretation.   DG C-Arm 1-60 Min-No Report  Result Date: 11/21/2021 Fluoroscopy was utilized by the requesting physician.  No radiographic interpretation.   DG Hip Unilat With Pelvis 2-3 Views Left  Result Date: 11/21/2021 CLINICAL DATA:  Fall 2 days ago. Left hip pain. Unable to ambulate. EXAM: DG HIP (WITH OR WITHOUT PELVIS) LV LEFT COMPARISON:  None Available. FINDINGS: A fracture through the neck of the left femur is displaced superiorly. Humeral head is located scratched at femoral head is located. No other acute osseous abnormality is present. Degenerative changes are present in the lower lumbar spine and SI joints. IMPRESSION: Displaced fracture through the neck of the left femur. Electronically Signed   By: San Morelle M.D.   On: 11/21/2021 10:47   DG Chest 2 View  Result Date: 11/21/2021 CLINICAL DATA:  Preoperative respiratory exam for hip fracture. EXAM: CHEST - 2 VIEW COMPARISON:  03/11/2020 FINDINGS: Lordotic positioning. Heart and mediastinal shadows are normal allowing technical features. Mild scarring/volume loss at the right lung base. The chest is otherwise clear. Old augmented fracture at the thoracolumbar junction region. IMPRESSION: No active cardiopulmonary disease. Mild scarring/volume loss at the right lung base. Electronically Signed   By: Elta Guadeloupe  Shogry M.D.   On: 11/21/2021 10:47   DG Knee Complete 4 Views Left  Result Date: 11/21/2021 CLINICAL DATA:  Fall with leg pain EXAM: LEFT KNEE - COMPLETE 3 VIEW COMPARISON:  None  Available. FINDINGS: Prior total left knee arthroplasty. Hardware is intact. Evidence of fracture. Small joint effusion. Soft tissue swelling about the knee. IMPRESSION: Prior total left knee arthroplasty without evidence of fracture. Electronically Signed   By: Yetta Glassman M.D.   On: 11/21/2021 10:46    Labs: Recent Labs    11/29/21 1543 11/30/21 0518  WBC 10.8* 9.6  RBC 3.65* 3.34*  HCT 34.1* 30.9*  PLT 303 305   Recent Labs    11/29/21 1543  NA 137  K 4.0  CL 104  CO2 27  BUN 18  CREATININE 0.94  GLUCOSE 110*  CALCIUM 8.8*   Recent Labs    11/29/21 1543  INR 1.1    Review of Systems: ROS as detailed in HPI  Physical Exam: There is no height or weight on file to calculate BMI.  Physical Exam  Gen: AAOx3, NAD Comfortable at rest  Left Lower Extremity: Skin intact, DA hip incision c/d/I with staples in place No erythema No TTP Compartments soft compressible HF/KE/KF/ADF/APF/EHL 5/5 SILT throughout DP, PT 2+ to palp CR < 2s   Assessment and Plan: Ortho consult for postop check s/p left total hip arthroplasty for femoral neck fracture (Dr. Aaron Edelman Swinteck DOS 11/21/21) with ongoing admission for pulmonary embolism  -history, exam and imaging reviewed at length with patient and Dr. Lyla Glassing directly -WBAT with walker -VTE ppx per primary team given ongoing PE -PT/OT when cleared by medical team  -follow up as outpatient as planned with Dr. Lyla Glassing -staples out in 2-3 wk postop after wound check by Dr. Swinteck/surgical team  Armond Hang, MD Orthopaedic Surgeon [facility] [phone]

## 2021-12-01 ENCOUNTER — Observation Stay (HOSPITAL_BASED_OUTPATIENT_CLINIC_OR_DEPARTMENT_OTHER): Payer: Medicare Other

## 2021-12-01 DIAGNOSIS — I2699 Other pulmonary embolism without acute cor pulmonale: Secondary | ICD-10-CM | POA: Diagnosis not present

## 2021-12-01 DIAGNOSIS — I2609 Other pulmonary embolism with acute cor pulmonale: Secondary | ICD-10-CM | POA: Diagnosis not present

## 2021-12-01 LAB — ECHOCARDIOGRAM COMPLETE
Area-P 1/2: 2.45 cm2
MV VTI: 2.07 cm2
S' Lateral: 2.8 cm

## 2021-12-01 LAB — BASIC METABOLIC PANEL
Anion gap: 6 (ref 5–15)
BUN: 12 mg/dL (ref 8–23)
CO2: 27 mmol/L (ref 22–32)
Calcium: 8.9 mg/dL (ref 8.9–10.3)
Chloride: 106 mmol/L (ref 98–111)
Creatinine, Ser: 0.7 mg/dL (ref 0.44–1.00)
GFR, Estimated: >60 mL/min (ref >60)
Glucose, Bld: 102 mg/dL — ABNORMAL HIGH (ref 70–99)
Potassium: 3.9 mmol/L (ref 3.5–5.1)
Sodium: 139 mmol/L (ref 135–145)

## 2021-12-01 LAB — CBC
HCT: 31.2 % — ABNORMAL LOW (ref 36.0–46.0)
Hemoglobin: 9.7 g/dL — ABNORMAL LOW (ref 12.0–15.0)
MCH: 29 pg (ref 26.0–34.0)
MCHC: 31.1 g/dL (ref 30.0–36.0)
MCV: 93.4 fL (ref 80.0–100.0)
Platelets: 322 10*3/uL (ref 150–400)
RBC: 3.34 MIL/uL — ABNORMAL LOW (ref 3.87–5.11)
RDW: 14.3 % (ref 11.5–15.5)
WBC: 8.4 10*3/uL (ref 4.0–10.5)
nRBC: 0 % (ref 0.0–0.2)

## 2021-12-01 MED ORDER — PERFLUTREN LIPID MICROSPHERE
1.0000 mL | INTRAVENOUS | Status: AC | PRN
  Administered 2021-12-01: 2 mL via INTRAVENOUS

## 2021-12-01 MED ORDER — APIXABAN 5 MG PO TABS: ORAL_TABLET | ORAL | Status: DC

## 2021-12-01 MED ORDER — HYDROCODONE-ACETAMINOPHEN 10-325 MG PO TABS: 0.5000 | ORAL_TABLET | ORAL | 0 refills | Status: DC | PRN

## 2021-12-01 MED ORDER — PENTOSAN POLYSULFATE SODIUM 100 MG PO CAPS
100.0000 mg | ORAL_CAPSULE | Freq: Two times a day (BID) | ORAL | Status: DC
  Administered 2021-12-01: 100 mg via ORAL
  Filled 2021-12-01 (×2): qty 1

## 2021-12-01 MED ORDER — TRAMADOL HCL 50 MG PO TABS: 50.0000 mg | ORAL_TABLET | Freq: Four times a day (QID) | ORAL | 0 refills | Status: DC | PRN

## 2021-12-01 MED ORDER — CARVEDILOL 25 MG PO TABS
25.0000 mg | ORAL_TABLET | Freq: Two times a day (BID) | ORAL | Status: DC
  Administered 2021-12-01: 25 mg via ORAL
  Filled 2021-12-01: qty 1

## 2021-12-01 MED ORDER — DILTIAZEM HCL ER COATED BEADS 120 MG PO CP24
120.0000 mg | ORAL_CAPSULE | Freq: Every day | ORAL | Status: DC
  Administered 2021-12-01: 120 mg via ORAL
  Filled 2021-12-01: qty 1

## 2021-12-01 NOTE — Progress Notes (Signed)
Called Echo at 0700 no answer. Informed day nurse

## 2021-12-01 NOTE — TOC Transition Note (Signed)
Transition of Care [facility]) - CM/SW Discharge Note   Patient Details  Name: Shannon [name] MRN: [id number] Date of Birth: July 03, 1945  Transition of Care [facility]) CM/SW Contact:  Vassie Moselle, [place] Phone Number: 12/01/2021, 3:29 PM   Clinical Narrative:    Pt is to return to [facility] for SNF. Pt will be going to room 36. RN to call report to [phone]. Pt will be transported to facility via [facility].    Final next level of care: Skilled Nursing Facility Barriers to Discharge: No Barriers Identified   Patient Goals and CMS Choice Patient states their goals for this hospitalization and ongoing recovery are:: To return to SNF CMS Medicare.gov Compare Post Acute Care list provided to:: Patient Choice offered to / list presented to : Patient, Adult Children  Discharge Placement                    Patient and family notified of of transfer: 11/30/21  Discharge Plan and Services In-house Referral: Clinical Social Work Discharge Planning Services: CM Consult Post Acute Care Choice: [place]          DME Arranged: N/A DME Agency: NA                  Social Determinants of Health (SDOH) Interventions     Readmission Risk Interventions    11/23/2021    2:35 PM  Readmission Risk Prevention Plan  Transportation Screening Complete  PCP or Specialist Appt within 5-7 Days Complete  Home Care Screening Complete  Medication Review (RN CM) Complete

## 2021-12-01 NOTE — Evaluation (Signed)
Physical Therapy Evaluation Patient Details Name: Shannon [name] MRN: [id number] DOB: 1945/08/01 Today's Date: 12/01/2021  History of Present Illness  Patient is a 76 year old female with chronic diastolic CHF, HTN, dementia, HLD, GERD, anemia, breast cancer diagnosed in 2014, OSA, osteoporosis, recent mechanical fall with closed left hip fracture status post left total hip arthroplasty on 11/21/2021, was discharged on 10/28 presented from [facility] rehab due to complaints of generalized weakness and fatigue, pain and swelling.  Venous Dopplers negative for DVT  CTA chest showed right lower lobe segmental and subsegmental pulmonary emboli, right heart strain present  Clinical Impression  The  patient is admitted for above problems. Patient  comes from SNF/rehab  with PE. Patient ambulated x 80', plans to return to SNF for rehab. Pt admitted with above diagnosis.  Pt currently with functional limitations due to the deficits listed below (see PT Problem List). Pt will benefit from skilled PT to increase their independence and safety with mobility to allow discharge to the venue listed below.          Recommendations for follow up therapy are one component of a multi-disciplinary discharge planning process, led by the attending physician.  Recommendations may be updated based on patient status, additional functional criteria and insurance authorization.  Follow Up Recommendations Skilled nursing-short term rehab (<3 hours/day) Can patient physically be transported by private vehicle: No    Assistance Recommended at Discharge Frequent or constant Supervision/Assistance  Patient can return home with the following  A little help with walking and/or transfers;A little help with bathing/dressing/bathroom;Assistance with cooking/housework;Assist for transportation;Help with stairs or ramp for entrance;Direct supervision/assist for medications management    Equipment Recommendations None  recommended by PT  Recommendations for Other Services       Functional Status Assessment Patient has had a recent decline in their functional status and demonstrates the ability to make significant improvements in function in a reasonable and predictable amount of time.     Precautions / Restrictions Precautions Precautions: Fall Precaution Comments: monitor sats Restrictions Weight Bearing Restrictions: No LLE Weight Bearing: Weight bearing as tolerated      Mobility  Bed Mobility   Bed Mobility: Sit to Supine       Sit to supine: Min assist   General bed mobility comments: assist with legs onto bed    Transfers Overall transfer level: Needs assistance Equipment used: Rolling walker (2 wheels) Transfers: Sit to/from Stand Sit to Stand: Supervision                Ambulation/Gait Ambulation/Gait assistance: Supervision Gait Distance (Feet): 80 Feet Assistive device: Rolling walker (2 wheels) Gait Pattern/deviations: Step-to pattern, Step-through pattern       General Gait Details: pt states the left leg feels  longer, noted a slight dip when steps on the right  Stairs            Wheelchair Mobility    Modified Rankin (Stroke Patients Only)       Balance           Standing balance support: Reliant on assistive device for balance, During functional activity, Bilateral upper extremity supported   Standing balance comment: reliant on RW for balance.                             Pertinent Vitals/Pain Pain Assessment Faces Pain Scale: Hurts even more Pain Location: incision site, L hip with activity Pain Descriptors / Indicators: Grimacing  Pain Intervention(s): Monitored during session, Patient requesting pain meds-RN notified    Home Living Family/patient expects to be discharged to:: Skilled nursing facility                   Additional Comments: retruning to  SNF    Prior Function               Mobility  Comments: ambulating with RW at SNF       Hand Dominance        Extremity/Trunk Assessment   Upper Extremity Assessment Upper Extremity Assessment: Overall WFL for tasks assessed    Lower Extremity Assessment LLE Deficits / Details: able to flex the leg to about  45* in supine    Cervical / Trunk Assessment Cervical / Trunk Assessment: Other exceptions;Kyphotic  Communication      Cognition Arousal/Alertness: Awake/alert Behavior During Therapy: WFL for tasks assessed/performed Overall Cognitive Status: Within Functional Limits for tasks assessed                                          General Comments      Exercises Total Joint Exercises Ankle Circles/Pumps: AROM, Both, 10 reps Quad Sets: AROM, Both, 10 reps Heel Slides: AAROM, Left, 10 reps, Supine   Assessment/Plan    PT Assessment Patient needs continued PT services  PT Problem List Decreased strength;Decreased range of motion;Decreased mobility;Decreased activity tolerance;Decreased balance;Pain       PT Treatment Interventions Gait training;Therapeutic exercise;Therapeutic activities;DME instruction;Functional mobility training;Patient/family education    PT Goals (Current goals can be found in the Care Plan section)  Acute Rehab PT Goals Patient Stated Goal: to go home, decrease pain PT Goal Formulation: With patient Time For Goal Achievement: 12/08/21 Potential to Achieve Goals: Good    Frequency Min 2X/week     Co-evaluation               AM-PAC PT "6 Clicks" Mobility  Outcome Measure Help needed turning from your back to your side while in a flat bed without using bedrails?: A Little Help needed moving from lying on your back to sitting on the side of a flat bed without using bedrails?: A Little Help needed moving to and from a bed to a chair (including a wheelchair)?: A Little Help needed standing up from a chair using your arms (e.g., wheelchair or bedside chair)?: A  Little Help needed to walk in hospital room?: A Little Help needed climbing 3-5 steps with a railing? : A Lot 6 Click Score: 17    End of Session Equipment Utilized During Treatment: Gait belt Activity Tolerance: Patient tolerated treatment well Patient left: in bed;with call bell/phone within reach Nurse Communication: Mobility status PT Visit Diagnosis: Difficulty in walking, not elsewhere classified (R26.2);Pain Pain - Right/Left: Left Pain - part of body: Hip    Time:  -      Charges:              [place] Office [phone] Weekend pager-[phone]   [name] 12/01/2021, 2:03 PM

## 2021-12-01 NOTE — Progress Notes (Signed)
  Echocardiogram 2D Echocardiogram has been performed.  Shannon [name] 12/01/2021, 12:35 PM

## 2021-12-01 NOTE — Progress Notes (Signed)
[facility] still not here to pick up patient. Patient's daughter called and stated that she would pick up patient and take her back to [facility]. Called [facility] to take her off the list.

## 2021-12-01 NOTE — Discharge Summary (Addendum)
Physician Discharge Summary   Patient: Shannon [name] MRN: [id number] DOB: [birth year]  Admit date:     11/29/2021  Discharge date: 12/01/21  Discharge Physician: Estill Cotta, MD    PCP: Mosie Lukes, MD   Recommendations at discharge:   Eliquis 10 mg p.o. twice daily for 1 week then taper down to 5 mg p.o. twice daily for minimum of 6 months Aspirin discontinued  Discharge Diagnoses:    Acute pulmonary embolism ([place])   Chronic diastolic CHF (congestive heart failure) ([place])   Essential hypertension   Alzheimer's dementia without behavioral disturbance ([place])   Mixed hyperlipidemia   GERD without esophagitis Recent left hip fracture status post hip arthroplasty on 10/23   Postoperative anemia due to acute blood loss   Left leg swelling   Sleep apnea    Hospital Course:  Patient is a 76 year old female with chronic diastolic CHF, HTN, dementia, HLD, GERD, anemia, breast cancer diagnosed in 2014, OSA, osteoporosis, recent mechanical fall with closed left hip fracture status post left total hip arthroplasty on 11/21/2021, was discharged on 10/28 presented from [facility] rehab due to complaints of generalized weakness and fatigue, pain and swelling. Venous Dopplers negative for DVT CTA chest showed right lower lobe segmental and subsegmental pulmonary emboli, right heart strain present with RV to LV ratio 1.1   Patient was admitted for further work-up.     Assessment and Plan:    Acute right pulmonary embolism ([place]) -Likely due to recent surgery, immobility, has history of breast CA -Patient was started on IV heparin drip -Doppler ultrasounds negative for DVT -Transitioned to oral Eliquis 10 mg p.o. twice daily for 1 week. On 12/07/21, taper to 5 mg p.o. twice daily for minimum of 6 months, likely provoked PE due to recent surgery and immobility -2D echo showed EF 60-65%, no regional WMA, G1DD      Chronic diastolic CHF (congestive heart failure) ([facility]) -Currently  appears to be euvolemic   Recent left hip fracture status post hip arthroplasty on 10/23 -Continue pain control, -Now on full dose anticoagulation -Patient will return back to SNF to continue with physical therapy Outpatient follow-up with Dr. Lyla Glassing in 2 weeks     Essential hypertension -BP currently stable     Alzheimer's dementia without behavioral disturbance ([place]) -Appears to be closer to her baseline     Mixed hyperlipidemia -Continue Crestor 20 mg daily     GERD without esophagitis -cont Pepcid     Postoperative anemia due to acute blood loss -H&H baseline 9-10 -H&H stable, 9.7 at discharge     Sleep apnea -cont CPAP   Obesity Estimated body mass index is 32.42 kg/m as calculated from the following:   Height as of 11/21/21: '5\' 1"'$  (1.549 m).   Weight as of 11/25/21: 77.8 kg.       Pain control - [employer] Controlled Substance Reporting System database was reviewed. and patient was instructed, not to drive, operate heavy machinery, perform activities at heights, swimming or participation in water activities or provide baby-sitting services while on Pain, Sleep and Anxiety Medications; until their outpatient Physician has advised to do so again. Also recommended to not to take more than prescribed Pain, Sleep and Anxiety Medications.  Consultants: Orthopedics Procedures performed: 2D echo, CTA chest Disposition: Skilled nursing facility Diet recommendation:  Discharge Diet Orders (From admission, onward)     Start     Ordered   12/01/21 0000  Diet - low sodium heart healthy  12/01/21 1127           Cardiac diet DISCHARGE MEDICATION: Allergies as of 12/01/2021       Reactions   Dilaudid [hydromorphone Hcl] Itching, Other (See Comments)   "Went Crazy"         Medication List     STOP taking these medications    aspirin 81 MG chewable tablet Commonly known as: Aspirin Childrens       TAKE these medications    acetaminophen 325  MG tablet Commonly known as: TYLENOL Take 2 tablets (650 mg total) by mouth every 6 (six) hours as needed for mild pain or moderate pain (pain score 1-3 or temp > 100.5).   albuterol 108 (90 Base) MCG/ACT inhaler Commonly known as: VENTOLIN HFA INHALE 1-2 PUFFS INTO THE LUNGS EVERY 6 (SIX) HOURS AS NEEDED FOR WHEEZING OR SHORTNESS OF BREATH. What changed: how much to take   apixaban 5 MG Tabs tablet Commonly known as: ELIQUIS Take 2 tabs ('10mg'$ ) twice a day for 1 week.  On 12/07/2021, start taking eliquis 5 mg 1 tab twice a day.   CALCIUM 600 PO Take 1 tablet by mouth daily.   carvedilol 25 MG tablet Commonly known as: COREG TAKE 1 TABLET TWICE DAILY WITH A MEAL What changed: See the new instructions.   cetirizine 10 MG tablet Commonly known as: ZYRTEC Take 10 mg by mouth at bedtime.   cholecalciferol 25 MCG (1000 UNIT) tablet Commonly known as: VITAMIN D3 Take 1,000 Units by mouth daily.   diltiazem 120 MG 24 hr capsule Commonly known as: CARDIZEM CD TAKE 1 CAPSULE BY MOUTH EVERY DAY What changed: how much to take   docusate sodium 100 MG capsule Commonly known as: COLACE Take 1 capsule (100 mg total) by mouth 2 (two) times daily.   donepezil 10 MG tablet Commonly known as: ARICEPT TAKE 1 TABLET AT BEDTIME   famotidine 20 MG tablet Commonly known as: PEPCID TAKE 1 TABLET TWICE DAILY   ferrous sulfate 325 (65 FE) MG tablet Take 325 mg by mouth daily with breakfast.   folic acid 1 MG tablet Commonly known as: FOLVITE Take 1 tablet (1 mg total) by mouth daily.   gabapentin 300 MG capsule Commonly known as: NEURONTIN Take 1 capsule (300 mg total) by mouth 2 (two) times daily.   HYDROcodone-acetaminophen 10-325 MG tablet Commonly known as: NORCO Take 0.5 tablets by mouth every 4 (four) hours as needed for moderate pain or severe pain.   hydrOXYzine 25 MG tablet Commonly known as: ATARAX TAKE 0.5-1 TABLETS (12.5-25 MG TOTAL) BY MOUTH 2 (TWO) TIMES DAILY AS  NEEDED. What changed:  how much to take reasons to take this   multivitamin with minerals Tabs tablet Take 1 tablet by mouth daily.   nystatin powder Commonly known as: MYCOSTATIN/NYSTOP Apply topically 3 (three) times daily for 7 days. Apply to affected areas ie under right breast   omeprazole 20 MG capsule Commonly known as: PRILOSEC Take 20 mg by mouth at bedtime.   pentosan polysulfate 100 MG capsule Commonly known as: ELMIRON Take 200 mg by mouth in the morning and at bedtime.   polyethylene glycol 17 g packet Commonly known as: MIRALAX / GLYCOLAX Take 17 g by mouth daily as needed for mild constipation.   rosuvastatin 20 MG tablet Commonly known as: CRESTOR TAKE 1 TABLET AT BEDTIME   traMADol 50 MG tablet Commonly known as: ULTRAM Take 1 tablet (50 mg total) by mouth every 6 (six) hours  as needed for moderate pain or severe pain.   venlafaxine 75 MG tablet Commonly known as: EFFEXOR Take 225 mg by mouth in the morning.               Discharge Care Instructions  (From admission, onward)           Start     Ordered   12/01/21 0000  If the dressing is still on your incision site when you go home, remove it on the third day after your surgery date. Remove dressing if it begins to fall off, or if it is dirty or damaged before the third day.        12/01/21 1127            Follow-up Information     Mosie Lukes, MD. Schedule an appointment as soon as possible for a visit in 2 week(s).   Specialty: Family Medicine Why: for hospital follow-up Contact information: [address] [place] [address] [place] [address] [phone]         Rod Can, MD. Schedule an appointment as soon as possible for a visit in 2 week(s).   Specialty: Orthopedic Surgery Why: suture removal and for hospital follow-up Contact information: [address] [place] [address] [phone]                Discharge Exam: S: No  acute issues, awaiting to discharge to SNF today.  Pain controlled.  No acute issues overnight.  No fevers or chills, chest pain or shortness of breath.   Vitals:   11/30/21 2024 12/01/21 0553 12/01/21 1253 12/01/21 1308  BP: (!) 160/90 (!) 158/68 132/71   Pulse: 88 71 80   Resp: '20 16 18   '$ Temp: 98.1 F (36.7 C) 98.1 F (36.7 C) 97.7 F (36.5 C)   TempSrc: Oral Oral Oral   SpO2: 100% 97% 100%   Weight:    77.8 kg  Height:    '5\' 1"'$  (1.549 m)    Physical Exam General: Alert and oriented, NAD Cardiovascular: S1 S2 clear, RRR.  Respiratory: CTAB, no wheezing, rales or rhonchi Gastrointestinal: Soft, nontender, nondistended, NBS Ext: no pedal edema bilaterally Neuro: no new deficits Psych: Normal affect lung dementia, appears close to her baseline  Condition at discharge: fair  The results of significant diagnostics from this hospitalization (including imaging, microbiology, ancillary and laboratory) are listed below for reference.   Imaging Studies: ECHOCARDIOGRAM COMPLETE  Result Date: 12/01/2021    ECHOCARDIOGRAM REPORT   Patient Name:   Shannon [name] Date of Exam: 12/01/2021 Medical Rec #:  [id number]     Height:       61.0 in Accession #:    [id number]    Weight:       171.6 lb Date of Birth:  [birth year]      BSA:          1.770 m Patient Age:    54 years      BP:           158/68 mmHg Patient Gender: F             HR:           71 bpm. Exam Location:  Inpatient Procedure: 2D Echo, Cardiac Doppler, Color Doppler and Intracardiac            Opacification Agent Indications:    Pulmonary embolism  History:        Patient has prior  history of Echocardiogram examinations, most                 recent 01/12/2020. CHF and Cardiomyopathy,                 Signs/Symptoms:Dementia; Risk Factors:Hypertension, Dyslipidemia                 and Sleep Apnea. GERD, anemia, breast cancer.  Sonographer:    Eartha Inch Referring Phys: [id number] [place] [address] Maudene Stotler  Sonographer Comments: Technically difficult  study due to poor echo windows. Image acquisition challenging due to patient body habitus and Image acquisition challenging due to respiratory motion. IMPRESSIONS  1. Left ventricular ejection fraction, by estimation, is 60 to 65%. The left ventricle has normal function. The left ventricle has no regional wall motion abnormalities. Left ventricular diastolic parameters are consistent with Grade I diastolic dysfunction (impaired relaxation).  2. Right ventricular systolic function is normal. The right ventricular size is normal. Tricuspid regurgitation signal is inadequate for assessing PA pressure.  3. The mitral valve is normal in structure. Trivial mitral valve regurgitation. No evidence of mitral stenosis.  4. The aortic valve is tricuspid. Aortic valve regurgitation is not visualized. Aortic valve sclerosis/calcification is present, without any evidence of aortic stenosis.  5. The inferior vena cava is normal in size with greater than 50% respiratory variability, suggesting right atrial pressure of 3 mmHg. FINDINGS  Left Ventricle: Left ventricular ejection fraction, by estimation, is 60 to 65%. The left ventricle has normal function. The left ventricle has no regional wall motion abnormalities. Definity contrast agent was given IV to delineate the left ventricular  endocardial borders. The left ventricular internal cavity size was normal in size. There is no left ventricular hypertrophy. Left ventricular diastolic parameters are consistent with Grade I diastolic dysfunction (impaired relaxation). Normal left ventricular filling pressure. Right Ventricle: The right ventricular size is normal. No increase in right ventricular wall thickness. Right ventricular systolic function is normal. Tricuspid regurgitation signal is inadequate for assessing PA pressure. Left Atrium: Left atrial size was normal in size. Right Atrium: Right atrial size was normal in size. Pericardium: There is no evidence of pericardial  effusion. Mitral Valve: The mitral valve is normal in structure. Trivial mitral valve regurgitation. No evidence of mitral valve stenosis. MV peak gradient, 4.8 mmHg. The mean mitral valve gradient is 2.0 mmHg. Tricuspid Valve: The tricuspid valve is normal in structure. Tricuspid valve regurgitation is not demonstrated. No evidence of tricuspid stenosis. Aortic Valve: The aortic valve is tricuspid. Aortic valve regurgitation is not visualized. Aortic valve sclerosis/calcification is present, without any evidence of aortic stenosis. Pulmonic Valve: The pulmonic valve was normal in structure. Pulmonic valve regurgitation is not visualized. No evidence of pulmonic stenosis. Aorta: The aortic root is normal in size and structure. Venous: The inferior vena cava is normal in size with greater than 50% respiratory variability, suggesting right atrial pressure of 3 mmHg. IAS/Shunts: No atrial level shunt detected by color flow Doppler.  LEFT VENTRICLE PLAX 2D LVIDd:         4.50 cm   Diastology LVIDs:         2.80 cm   LV e' medial:    6.74 cm/s LV PW:         0.90 cm   LV E/e' medial:  7.8 LV IVS:        0.80 cm   LV e' lateral:   11.30 cm/s LVOT diam:     1.90 cm  LV E/e' lateral: 4.6 LV SV:         51 LV SV Index:   29 LVOT Area:     2.84 cm  RIGHT VENTRICLE             IVC RV S prime:     16.40 cm/s  IVC diam: 1.60 cm TAPSE (M-mode): 1.5 cm LEFT ATRIUM             Index        RIGHT ATRIUM           Index LA diam:        4.00 cm 2.26 cm/m   RA Area:     11.30 cm LA Vol (A2C):   41.3 ml 23.34 ml/m  RA Volume:   23.00 ml  13.00 ml/m LA Vol (A4C):   49.8 ml 28.14 ml/m LA Biplane Vol: 45.6 ml 25.77 ml/m  AORTIC VALVE LVOT Vmax:   93.10 cm/s LVOT Vmean:  64.000 cm/s LVOT VTI:    0.179 m  AORTA Ao Root diam: 2.60 cm Ao Asc diam:  3.30 cm MITRAL VALVE MV Area (PHT): 2.45 cm    SHUNTS MV Area VTI:   2.07 cm    Systemic VTI:  0.18 m MV Peak grad:  4.8 mmHg    Systemic Diam: 1.90 cm MV Mean grad:  2.0 mmHg MV Vmax:        1.10 m/s MV Vmean:      58.2 cm/s MV Decel Time: 310 msec MV E velocity: 52.40 cm/s MV A velocity: 95.60 cm/s MV E/A ratio:  0.55 Fransico Him MD Electronically signed by Fransico Him MD Signature Date/Time: 12/01/2021/3:20:55 PM    Final    CT Angio Chest PE W/Cm &/Or Wo Cm  Result Date: 11/29/2021 CLINICAL DATA:  Positive D-dimer.  Weakness.  Recent hip surgery. EXAM: CT ANGIOGRAPHY CHEST WITH CONTRAST TECHNIQUE: Multidetector CT imaging of the chest was performed using the standard protocol during bolus administration of intravenous contrast. Multiplanar CT image reconstructions and MIPs were obtained to evaluate the vascular anatomy. RADIATION DOSE REDUCTION: This exam was performed according to the departmental dose-optimization program which includes automated exposure control, adjustment of the mA and/or kV according to patient size and/or use of iterative reconstruction technique. CONTRAST:  43m OMNIPAQUE IOHEXOL 350 MG/ML SOLN COMPARISON:  CT chest abdomen and pelvis 06/07/2017 FINDINGS: Cardiovascular: There is adequate opacification of the pulmonary arteries. There are segmental and subsegmental right lower lobe pulmonary emboli. Heart and aorta are normal in size. No pericardial effusion. There are atherosclerotic calcifications of the aorta. Mediastinum/Nodes: No enlarged mediastinal, hilar, or axillary lymph nodes. Thyroid gland, trachea, and esophagus demonstrate no significant findings. Lungs/Pleura: There is some atelectasis in the right middle lobe. There is also some stable scarring in the right middle lobe. Fissural nodule measuring 3 mm in the right lower lobe is favored as intrapulmonary lymph node. This is unchanged from 2019 and favored as benign. No pleural effusion or pneumothorax identified. Upper Abdomen: Cholecystectomy clips are present. Musculoskeletal: T12 vertebroplasty changes again noted. Review of the MIP images confirms the above findings. IMPRESSION: 1. Right lower lobe  segmental and subsegmental pulmonary emboli. Right heart strain present with RV to LV ratio 1.1. 2. Right middle lobe atelectasis and scarring. Aortic Atherosclerosis (ICD10-I70.0). Electronically Signed   By: ARonney AstersM.D.   On: 11/29/2021 20:48   VAS [place]OWER EXTREMITY VENOUS (DVT) (7a-7p)  Result Date: 11/29/2021  Lower Venous DVT Study Patient Name:  Jeral Pinch  Date of Exam:   11/29/2021 Medical Rec #: [id number]      Accession #:    [id number] Date of Birth: 1945-04-10       Patient Gender: F Patient Age:   64 years Exam Location:  [facility] Procedure:      VAS [place] LOWER EXTREMITY VENOUS (DVT) Referring Phys: [facility] PFEIFFER --------------------------------------------------------------------------------  Indications: Swelling, and Pain.  Risk Factors: Surgery Left hip fracture with surgical repair (11/21/21). Limitations: Body habitus and poor ultrasound/tissue interface. Comparison Study: Previous exam on 09/21/20 was negative for DVT Performing Technologist: Rogelia Rohrer RVT, RDMS  Examination Guidelines: A complete evaluation includes B-mode imaging, spectral Doppler, color Doppler, and power Doppler as needed of all accessible portions of each vessel. Bilateral testing is considered an integral part of a complete examination. Limited examinations for reoccurring indications may be performed as noted. The reflux portion of the exam is performed with the patient in reverse Trendelenburg.  +-----+---------------+---------+-----------+----------+--------------+ RIGHTCompressibilityPhasicitySpontaneityPropertiesThrombus Aging +-----+---------------+---------+-----------+----------+--------------+ CFV  Full           Yes      Yes                                 +-----+---------------+---------+-----------+----------+--------------+   +---------+---------------+---------+-----------+----------+--------------+ LEFT     CompressibilityPhasicitySpontaneityPropertiesThrombus Aging  +---------+---------------+---------+-----------+----------+--------------+ CFV      Full           Yes      Yes                                 +---------+---------------+---------+-----------+----------+--------------+ SFJ      Full                                                        +---------+---------------+---------+-----------+----------+--------------+ FV Prox  Full           Yes      Yes                                 +---------+---------------+---------+-----------+----------+--------------+ FV Mid   Full           Yes      Yes                                 +---------+---------------+---------+-----------+----------+--------------+ FV DistalFull           Yes      Yes                                 +---------+---------------+---------+-----------+----------+--------------+ PFV      Full                                                        +---------+---------------+---------+-----------+----------+--------------+ POP      Full           Yes  Yes                                 +---------+---------------+---------+-----------+----------+--------------+ PTV      Full                                                        +---------+---------------+---------+-----------+----------+--------------+ PERO     Full                                                        +---------+---------------+---------+-----------+----------+--------------+    Summary: RIGHT: - No evidence of common femoral vein obstruction.  LEFT: - There is no evidence of deep vein thrombosis in the lower extremity.  - No cystic structure found in the popliteal fossa.  *See table(s) above for measurements and observations. Electronically signed by Harold Barban MD on 11/29/2021 at 8:45:35 PM.    Final    DG Hip Unilat W or Wo Pelvis 2-3 Views Left  Result Date: 11/29/2021 CLINICAL DATA:  Hip pain 1 week post replacement. Pain and swelling in left hip and leg.  EXAM: DG HIP (WITH OR WITHOUT PELVIS) 2-3V LEFT COMPARISON:  Preoperative radiograph 11/21/2021 FINDINGS: Left hip arthroplasty in expected alignment. Cerclage wire fixation about the intertrochanteric femur. No periprosthetic lucency or fracture. Chronic changes about the pubic symphysis. Skin staples in place. Soft tissue edema laterally IMPRESSION: Left hip arthroplasty without complication. Electronically Signed   By: Keith Rake M.D.   On: 11/29/2021 18:39   DG Chest Port 1 View  Result Date: 11/29/2021 CLINICAL DATA:  Question sepsis EXAM: PORTABLE CHEST 1 VIEW COMPARISON:  Chest 11/21/2021 FINDINGS: Improved aeration right lung base.  Left lung clear. Heart size upper normal.  Vascularity normal.  No edema or effusion. IMPRESSION: Improved aeration right lung base. No acute abnormality. Electronically Signed   By: Franchot Gallo M.D.   On: 11/29/2021 16:37   DG HIP PORT UNILAT WITH PELVIS 1V LEFT  Result Date: 11/21/2021 CLINICAL DATA:  Left hip fracture, left hip arthroplasty EXAM: DG HIP (WITH OR WITHOUT PELVIS) 1V PORT LEFT COMPARISON:  11/21/2021 FINDINGS: Nine fluoroscopic images are obtained during the performance of the procedure and are provided for interpretation only. Left femoral neck fracture is identified, with subsequent placement of a left hip arthroplasty in the expected position with no signs of acute complication. A cerclage wire surrounds the femoral component of the arthroplasty. Please refer to operative report. Fluoroscopy time: 11 seconds, 1.53 mGy IMPRESSION: 1. Left hip arthroplasty as above. Electronically Signed   By: Randa Ngo M.D.   On: 11/21/2021 19:58   DG Pelvis Portable  Result Date: 11/21/2021 CLINICAL DATA:  Left hip arthroplasty, left hip fracture EXAM: PORTABLE PELVIS 1-2 VIEWS COMPARISON:  11/21/2021 FINDINGS: Frontal view of the bilateral hips was performed. Superior pelvis is excluded by collimation. Interval placement of a left hip  arthroplasty, with cerclage wire surrounding the femoral component of the arthroplasty. Alignment is anatomic. Postsurgical changes are seen within the overlying soft tissues. Visualized portions of the pelvis and right hip are stable. IMPRESSION: 1. Left hip arthroplasty as above.  No signs of acute complication. Electronically Signed   By: Randa Ngo M.D.   On: 11/21/2021 19:57   DG C-Arm 1-60 Min-No Report  Result Date: 11/21/2021 Fluoroscopy was utilized by the requesting physician.  No radiographic interpretation.   DG C-Arm 1-60 Min-No Report  Result Date: 11/21/2021 Fluoroscopy was utilized by the requesting physician.  No radiographic interpretation.   DG Hip Unilat With Pelvis 2-3 Views Left  Result Date: 11/21/2021 CLINICAL DATA:  Fall 2 days ago. Left hip pain. Unable to ambulate. EXAM: DG HIP (WITH OR WITHOUT PELVIS) LV LEFT COMPARISON:  None Available. FINDINGS: A fracture through the neck of the left femur is displaced superiorly. Humeral head is located scratched at femoral head is located. No other acute osseous abnormality is present. Degenerative changes are present in the lower lumbar spine and SI joints. IMPRESSION: Displaced fracture through the neck of the left femur. Electronically Signed   By: San Morelle M.D.   On: 11/21/2021 10:47   DG Chest 2 View  Result Date: 11/21/2021 CLINICAL DATA:  Preoperative respiratory exam for hip fracture. EXAM: CHEST - 2 VIEW COMPARISON:  03/11/2020 FINDINGS: Lordotic positioning. Heart and mediastinal shadows are normal allowing technical features. Mild scarring/volume loss at the right lung base. The chest is otherwise clear. Old augmented fracture at the thoracolumbar junction region. IMPRESSION: No active cardiopulmonary disease. Mild scarring/volume loss at the right lung base. Electronically Signed   By: Nelson Chimes M.D.   On: 11/21/2021 10:47   DG Knee Complete 4 Views Left  Result Date: 11/21/2021 CLINICAL DATA:   Fall with leg pain EXAM: LEFT KNEE - COMPLETE 3 VIEW COMPARISON:  None Available. FINDINGS: Prior total left knee arthroplasty. Hardware is intact. Evidence of fracture. Small joint effusion. Soft tissue swelling about the knee. IMPRESSION: Prior total left knee arthroplasty without evidence of fracture. Electronically Signed   By: Yetta Glassman M.D.   On: 11/21/2021 10:46    Microbiology: Results for orders placed or performed during the hospital encounter of 11/29/21  Blood Culture (routine x 2)     Status: None (Preliminary result)   Collection Time: 11/29/21  3:15 PM   Specimen: BLOOD  Result Value Ref Range Status   Specimen Description   Final    BLOOD RIGHT ANTECUBITAL Performed at [facility] [address] [place], [place] [address]    Special Requests   Final    BOTTLES DRAWN AEROBIC AND ANAEROBIC Blood Culture adequate volume Performed at [facility] [address], [place], [place] [address]    Culture   Final    NO GROWTH 2 DAYS Performed at [facility] [address] [address], [place], [place] [address]    Report Status PENDING  Incomplete  Blood Culture (routine x 2)     Status: None (Preliminary result)   Collection Time: 11/29/21  4:07 PM   Specimen: BLOOD  Result Value Ref Range Status   Specimen Description   Final    BLOOD LEFT ANTECUBITAL Performed at [facility] [address] [place], [place] [address]    Special Requests   Final    BOTTLES DRAWN AEROBIC AND ANAEROBIC Blood Culture adequate volume Performed at [facility] [address] [place], [place] [address]    Culture   Final    NO GROWTH 2 DAYS Performed at [facility] [address], [place],  [address]    Report Status PENDING  Incomplete  Resp Panel by RT-PCR (Flu A&B, Covid)     Status: None   Collection Time: 11/29/21  4:07 PM   Specimen: Nasal Swab  Result Value Ref Range Status   SARS  Coronavirus 2 by RT PCR NEGATIVE NEGATIVE Final    Comment: (NOTE) SARS-CoV-2 target nucleic acids are NOT DETECTED.  The SARS-CoV-2 RNA is generally detectable in upper respiratory specimens during the acute phase of infection. The lowest concentration of SARS-CoV-2 viral copies this assay can detect is 138 copies/mL. A negative result does not preclude SARS-Cov-2 infection and should not be used as the sole basis for treatment or other patient management decisions. A negative result may occur with  improper specimen collection/handling, submission of specimen other than nasopharyngeal swab, presence of viral mutation(s) within the areas targeted by this assay, and inadequate number of viral copies(<138 copies/mL). A negative result must be combined with clinical observations, patient history, and epidemiological information. The expected result is Negative.  Fact Sheet for Patients:  EntrepreneurPulse.com.au  Fact Sheet for Healthcare Providers:  IncredibleEmployment.be  This test is no t yet approved or cleared by the [place] FDA and  has been authorized for detection and/or diagnosis of SARS-CoV-2 by FDA under an Emergency Use Authorization (EUA). This EUA will remain  in effect (meaning this test can be used) for the duration of the COVID-19 declaration under Section 564(b)(1) of the Act, 21 U.S.C.section 360bbb-3(b)(1), unless the authorization is terminated  or revoked sooner.       Influenza A by PCR NEGATIVE NEGATIVE Final   Influenza B by PCR NEGATIVE NEGATIVE Final    Comment: (NOTE) The Xpert Xpress SARS-CoV-2/FLU/RSV plus assay is intended as an aid in the diagnosis of influenza from Nasopharyngeal swab specimens and should not be used as a sole basis for treatment. Nasal washings and aspirates are unacceptable for Xpert Xpress SARS-CoV-2/FLU/RSV testing.  Fact Sheet for  Patients: EntrepreneurPulse.com.au  Fact Sheet for Healthcare Providers: IncredibleEmployment.be  This test is not yet approved or cleared by the [place] FDA and has been authorized for detection and/or diagnosis of SARS-CoV-2 by FDA under an Emergency Use Authorization (EUA). This EUA will remain in effect (meaning this test can be used) for the duration of the COVID-19 declaration under Section 564(b)(1) of the Act, 21 U.S.C. section 360bbb-3(b)(1), unless the authorization is terminated or revoked.  Performed at [facility], [place] [address] [place], [place] [address]   Urine Culture     Status: Abnormal (Preliminary result)   Collection Time: 11/29/21  5:19 PM   Specimen: In/Out Cath Urine  Result Value Ref Range Status   Specimen Description   Final    IN/OUT CATH URINE Performed at [facility] [address] [place], [place] [address]    Special Requests   Final    NONE Performed at [facility], [place] [address] [place], [place] [address]    Culture (A)  Final    30,000 COLONIES/mL PROTEUS MIRABILIS CULTURE REINCUBATED FOR BETTER GROWTH Performed at [facility], [place] [address] [place], [place] [address]    Report Status PENDING  Incomplete   Organism ID, Bacteria PROTEUS MIRABILIS (A)  Final      Susceptibility   Proteus mirabilis - MIC*    AMPICILLIN <=2 SENSITIVE Sensitive     CEFAZOLIN <=4 SENSITIVE Sensitive     CEFEPIME <=0.12 SENSITIVE Sensitive     CEFTRIAXONE <=0.25 SENSITIVE Sensitive     CIPROFLOXACIN <=0.25 SENSITIVE  Sensitive     GENTAMICIN <=1 SENSITIVE Sensitive     IMIPENEM 2 SENSITIVE Sensitive     NITROFURANTOIN 128 RESISTANT Resistant     TRIMETH/SULFA <=20 SENSITIVE Sensitive     AMPICILLIN/SULBACTAM <=2 SENSITIVE Sensitive     PIP/TAZO <=4 SENSITIVE Sensitive     * 30,000 COLONIES/mL PROTEUS MIRABILIS   *Note: Due to a large number  of results and/or encounters for the requested time period, some results have not been displayed. A complete set of results can be found in Results Review.    Labs: CBC: Recent Labs  Lab 11/25/21 0326 11/26/21 0344 11/29/21 1543 11/30/21 0518 12/01/21 0719  WBC 9.1 8.8 10.8* 9.6 8.4  NEUTROABS 6.5  --  6.9  --   --   HGB 9.6* 11.0* 10.5* 9.6* 9.7*  HCT 29.7* 34.0* 34.1* 30.9* 31.2*  MCV 90.5 90.4 93.4 92.5 93.4  PLT 180 199 303 305 917   Basic Metabolic Panel: Recent Labs  Lab 11/25/21 0326 11/26/21 0344 11/29/21 1543 11/30/21 0009 11/30/21 0518 12/01/21 0529  NA 140 142 137  --   --  139  K 3.9 3.9 4.0  --   --  3.9  CL 106 104 104  --   --  106  CO2 '27 31 27  '$ --   --  27  GLUCOSE 114* 105* 110*  --   --  102*  BUN '18 22 18  '$ --   --  12  CREATININE 0.69 0.66 0.94  --   --  0.70  CALCIUM 8.5* 9.0 8.8*  --   --  8.9  MG  --  2.0  --  1.8 2.0  --   PHOS  --   --   --  3.2 3.5  --    Liver Function Tests: Recent Labs  Lab 11/29/21 1543  AST 39  ALT 30  ALKPHOS 75  BILITOT 0.7  PROT 6.0*  ALBUMIN 3.2*   CBG: No results for input(s): "GLUCAP" in the last 168 hours.  Discharge time spent: greater than 30 minutes.  Signed: Estill Cotta, MD [facility] 12/01/2021

## 2021-12-03 LAB — URINE CULTURE: Culture: 30000 — AB

## 2021-12-04 LAB — CULTURE, BLOOD (ROUTINE X 2)
Culture: NO GROWTH
Culture: NO GROWTH
Special Requests: ADEQUATE
Special Requests: ADEQUATE

## 2021-12-05 DIAGNOSIS — M25562 Pain in left knee: Secondary | ICD-10-CM | POA: Diagnosis not present

## 2021-12-05 DIAGNOSIS — E559 Vitamin D deficiency, unspecified: Secondary | ICD-10-CM | POA: Diagnosis not present

## 2021-12-05 DIAGNOSIS — I2699 Other pulmonary embolism without acute cor pulmonale: Secondary | ICD-10-CM | POA: Diagnosis not present

## 2021-12-05 DIAGNOSIS — G629 Polyneuropathy, unspecified: Secondary | ICD-10-CM | POA: Diagnosis not present

## 2021-12-05 DIAGNOSIS — K219 Gastro-esophageal reflux disease without esophagitis: Secondary | ICD-10-CM | POA: Diagnosis not present

## 2021-12-05 DIAGNOSIS — G4700 Insomnia, unspecified: Secondary | ICD-10-CM | POA: Diagnosis not present

## 2021-12-05 DIAGNOSIS — K5900 Constipation, unspecified: Secondary | ICD-10-CM | POA: Diagnosis not present

## 2021-12-05 DIAGNOSIS — I5032 Chronic diastolic (congestive) heart failure: Secondary | ICD-10-CM | POA: Diagnosis not present

## 2021-12-05 DIAGNOSIS — I10 Essential (primary) hypertension: Secondary | ICD-10-CM | POA: Diagnosis not present

## 2021-12-05 DIAGNOSIS — E785 Hyperlipidemia, unspecified: Secondary | ICD-10-CM | POA: Diagnosis not present

## 2021-12-05 DIAGNOSIS — S72002D Fracture of unspecified part of neck of left femur, subsequent encounter for closed fracture with routine healing: Secondary | ICD-10-CM | POA: Diagnosis not present

## 2021-12-05 DIAGNOSIS — G4733 Obstructive sleep apnea (adult) (pediatric): Secondary | ICD-10-CM | POA: Diagnosis not present

## 2021-12-06 DIAGNOSIS — S72032D Displaced midcervical fracture of left femur, subsequent encounter for closed fracture with routine healing: Secondary | ICD-10-CM | POA: Diagnosis not present

## 2021-12-07 ENCOUNTER — Ambulatory Visit (INDEPENDENT_AMBULATORY_CARE_PROVIDER_SITE_OTHER): Payer: Medicare Other | Admitting: Psychology

## 2021-12-07 ENCOUNTER — Telehealth: Payer: Self-pay | Admitting: Family Medicine

## 2021-12-07 DIAGNOSIS — F32A Depression, unspecified: Secondary | ICD-10-CM | POA: Diagnosis not present

## 2021-12-07 DIAGNOSIS — K5900 Constipation, unspecified: Secondary | ICD-10-CM | POA: Diagnosis not present

## 2021-12-07 DIAGNOSIS — I2699 Other pulmonary embolism without acute cor pulmonale: Secondary | ICD-10-CM | POA: Diagnosis not present

## 2021-12-07 DIAGNOSIS — F4323 Adjustment disorder with mixed anxiety and depressed mood: Secondary | ICD-10-CM | POA: Diagnosis not present

## 2021-12-07 DIAGNOSIS — D649 Anemia, unspecified: Secondary | ICD-10-CM | POA: Diagnosis not present

## 2021-12-07 DIAGNOSIS — S72002D Fracture of unspecified part of neck of left femur, subsequent encounter for closed fracture with routine healing: Secondary | ICD-10-CM | POA: Diagnosis not present

## 2021-12-07 DIAGNOSIS — R42 Dizziness and giddiness: Secondary | ICD-10-CM | POA: Diagnosis not present

## 2021-12-07 DIAGNOSIS — G629 Polyneuropathy, unspecified: Secondary | ICD-10-CM | POA: Diagnosis not present

## 2021-12-07 DIAGNOSIS — G4733 Obstructive sleep apnea (adult) (pediatric): Secondary | ICD-10-CM | POA: Diagnosis not present

## 2021-12-07 DIAGNOSIS — E559 Vitamin D deficiency, unspecified: Secondary | ICD-10-CM | POA: Diagnosis not present

## 2021-12-07 DIAGNOSIS — I959 Hypotension, unspecified: Secondary | ICD-10-CM | POA: Diagnosis not present

## 2021-12-07 DIAGNOSIS — K219 Gastro-esophageal reflux disease without esophagitis: Secondary | ICD-10-CM | POA: Diagnosis not present

## 2021-12-07 DIAGNOSIS — E785 Hyperlipidemia, unspecified: Secondary | ICD-10-CM | POA: Diagnosis not present

## 2021-12-07 NOTE — Telephone Encounter (Signed)
Patient thought she had appt with Terri today but wanted to let her know she is in hospital and will be released later today or tomorrow.

## 2021-12-07 NOTE — Progress Notes (Signed)
[name]  Patient ID: Shannon [name], MRN: [id number],    Date: 12/07/2021  Time Spent: 2:00pm-2:50pm     50 minutes   Treatment Type: Individual Therapy  Reported Symptoms: stress  Mental Status Exam: Appearance:  Casual     Behavior: Appropriate  Motor: Normal  Speech/Language:  Normal Rate  Affect: Appropriate  Mood: normal  Thought process: normal  Thought content:   WNL  Sensory/Perceptual disturbances:   WNL  Orientation: oriented to person, place, time/date, and situation  Attention: Good  Concentration: Good  Memory: WNL  Fund of knowledge:  Good  Insight:   Good  Judgment:  Good  Impulse Control: Good   Risk Assessment: Danger to Self:  No Self-injurious Behavior: No Danger to Others: No Duty to Warn:no Physical Aggression / Violence:No  Access to Firearms a concern: No  Gang Involvement:No   Subjective:  Pt present for  individual therapy via phone.  Pt consents to telehealth session due to [place] 19 pandemic. Location of pt: rehab facility Location of therapist: home office.   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  Pt was in the hospital for awhile bc she also had a blood clot in her lung.  Pt states the recovery has been long and painful.  She has received a lot of support from family and her minister.   Pt is concerned about her husband's health.   He had rotator cuff surgery that did not go well.  Addressed pt's concerns. Pt talked about feeling overwhelmed.  She and her husband are thinking about selling their [place] house.  They don't get to go there very often but pt states she will miss it.  Pt is trying to decide what to do with the big house they live in.  She feels pressure to downsize but it is overwhelming for her.  Worked on self care. Provided supportive therapy.    Interventions: Cognitive Behavioral Therapy and  Insight-Oriented  Diagnosis:  F43.23  Plan: Plan of Care:  Recommend ongoing therapy.   Pt participated in setting treatment goals.   Plan to meet every two weeks.  Treatment Plan (Treatment Plan Target Date: 04/22/2022) Client Abilities/Strengths  Pt is engaging and motivated for therapy.  Client Treatment Preferences  Individual therapy.  Client Statement of Needs  Improve copings skills and have a place to talk about her feelings.   Symptoms  Depressed or irritable mood. Excessive and/or unrealistic worry that is difficult to control occurring more days than not for at least 6 months about a number of events or activities. Hypervigilance (e.g., feeling constantly on edge, experiencing concentration difficulties, having trouble falling or staying asleep, exhibiting a general state of irritability). Low self-esteem. Problems Addressed  Unipolar Depression, Anxiety Goals 1. Alleviate depressive symptoms and return to previous level of effective functioning. 2. Appropriately grieve the loss in order to normalize mood and to return to previously adaptive level of functioning. Objective Learn and implement behavioral strategies to overcome depression. Target Date: 2022-04-22 Frequency: Biweekly  Progress: 10 Modality: individual  Related Interventions Assist the client in developing skills that increase the likelihood of deriving pleasure from behavioral activation (e.g., assertiveness skills, developing an exercise plan, less internal/more external focus, increased social involvement); reinforce success. Engage the client in "behavioral activation," increasing his/her activity level and contact with sources of reward, while identifying processes that inhibit activation. use behavioral techniques such  as instruction, rehearsal, role-playing, role reversal, as needed, to facilitate activity in the client's daily life; reinforce success. 3. Develop healthy interpersonal relationships that lead to  the alleviation and help prevent the relapse of depression. 4. Develop healthy thinking patterns and beliefs about self, others, and the world that lead to the alleviation and help prevent the relapse of depression. 5. Enhance ability to effectively cope with the full variety of life's worries and anxieties. 6. Learn and implement coping skills that result in a reduction of anxiety and worry, and improved daily functioning. Objective Learn and implement problem-solving strategies for realistically addressing worries. Target Date: 2022-04-22 Frequency: Biweekly  Progress: 10 Modality: individual  Related Interventions Assign the client a homework exercise in which he/she problem-solves a current problem (see Mastery of Your Anxiety and Worry: Workbook by Adora Fridge and Eliot Ford or Generalized Anxiety Disorder by Eather Colas, and Eliot Ford); review, reinforce success, and provide corrective feedback toward improvement. Teach the client problem-solving strategies involving specifically defining a problem, generating options for addressing it, evaluating the pros and cons of each option, selecting and implementing an optional action, and reevaluating and refining the action. Objective Learn and implement calming skills to reduce overall anxiety and manage anxiety symptoms. Target Date: 2022-04-22 Frequency: Biweekly  Progress: 10 Modality: individual  Related Interventions Assign the client to read about progressive muscle relaxation and other calming strategies in relevant books or treatment manuals (e.g., Progressive Relaxation Training by Leroy Kennedy; Mastery of Your Anxiety and Worry: Workbook by Beckie Busing). Assign the client homework each session in which he/she practices relaxation exercises daily, gradually applying them progressively from non-anxiety-provoking to anxiety-provoking situations; review and reinforce success while providing corrective feedback toward improvement. Teach  the client calming/relaxation skills (e.g., applied relaxation, progressive muscle relaxation, cue controlled relaxation; mindful breathing; biofeedback) and how to discriminate better between relaxation and tension; teach the client how to apply these skills to his/her daily life. 7. Recognize, accept, and cope with feelings of depression. 8. Reduce overall frequency, intensity, and duration of the anxiety so that daily functioning is not impaired. 9. Resolve the core conflict that is the source of anxiety. 10. Stabilize anxiety level while increasing ability to function on a daily basis. Diagnosis F43.23   Shannon Bolder, LCSW

## 2021-12-13 DIAGNOSIS — S72002D Fracture of unspecified part of neck of left femur, subsequent encounter for closed fracture with routine healing: Secondary | ICD-10-CM | POA: Diagnosis not present

## 2021-12-13 DIAGNOSIS — Z471 Aftercare following joint replacement surgery: Secondary | ICD-10-CM | POA: Diagnosis not present

## 2021-12-14 DIAGNOSIS — S72002D Fracture of unspecified part of neck of left femur, subsequent encounter for closed fracture with routine healing: Secondary | ICD-10-CM | POA: Diagnosis not present

## 2021-12-14 DIAGNOSIS — Z471 Aftercare following joint replacement surgery: Secondary | ICD-10-CM | POA: Diagnosis not present

## 2021-12-16 DIAGNOSIS — S72002D Fracture of unspecified part of neck of left femur, subsequent encounter for closed fracture with routine healing: Secondary | ICD-10-CM | POA: Diagnosis not present

## 2021-12-16 DIAGNOSIS — Z471 Aftercare following joint replacement surgery: Secondary | ICD-10-CM | POA: Diagnosis not present

## 2021-12-20 DIAGNOSIS — Z471 Aftercare following joint replacement surgery: Secondary | ICD-10-CM | POA: Diagnosis not present

## 2021-12-20 DIAGNOSIS — S72002D Fracture of unspecified part of neck of left femur, subsequent encounter for closed fracture with routine healing: Secondary | ICD-10-CM | POA: Diagnosis not present

## 2021-12-26 DIAGNOSIS — Z471 Aftercare following joint replacement surgery: Secondary | ICD-10-CM | POA: Diagnosis not present

## 2021-12-26 DIAGNOSIS — S72002D Fracture of unspecified part of neck of left femur, subsequent encounter for closed fracture with routine healing: Secondary | ICD-10-CM | POA: Diagnosis not present

## 2021-12-26 NOTE — Assessment & Plan Note (Signed)
Encourage heart healthy diet such as MIND or DASH diet, increase exercise, avoid trans fats, simple carbohydrates and processed foods, consider a krill or fish or flaxseed oil cap daily.  Tolerating Rosuvastatin 

## 2021-12-26 NOTE — Assessment & Plan Note (Signed)
Well controlled, no changes to meds. Encouraged heart healthy diet such as the DASH diet and exercise as tolerated.  °

## 2021-12-26 NOTE — Assessment & Plan Note (Addendum)
Tolerating Eliquis, will need to take 5 mg twice daily for next 6 months as long as continues to tolerate

## 2021-12-26 NOTE — Assessment & Plan Note (Signed)
Tolerating Aricept

## 2021-12-27 ENCOUNTER — Ambulatory Visit (INDEPENDENT_AMBULATORY_CARE_PROVIDER_SITE_OTHER): Payer: Medicare Other | Admitting: Family Medicine

## 2021-12-27 VITALS — BP 130/70 | HR 68 | Temp 98.0°F | Resp 16 | Ht 63.0 in | Wt 165.0 lb

## 2021-12-27 DIAGNOSIS — F418 Other specified anxiety disorders: Secondary | ICD-10-CM | POA: Diagnosis not present

## 2021-12-27 DIAGNOSIS — R7303 Prediabetes: Secondary | ICD-10-CM | POA: Diagnosis not present

## 2021-12-27 DIAGNOSIS — F0390 Unspecified dementia without behavioral disturbance: Secondary | ICD-10-CM | POA: Diagnosis not present

## 2021-12-27 DIAGNOSIS — I10 Essential (primary) hypertension: Secondary | ICD-10-CM | POA: Diagnosis not present

## 2021-12-27 DIAGNOSIS — R739 Hyperglycemia, unspecified: Secondary | ICD-10-CM | POA: Diagnosis not present

## 2021-12-27 DIAGNOSIS — E559 Vitamin D deficiency, unspecified: Secondary | ICD-10-CM | POA: Diagnosis not present

## 2021-12-27 DIAGNOSIS — E782 Mixed hyperlipidemia: Secondary | ICD-10-CM

## 2021-12-27 DIAGNOSIS — S72032D Displaced midcervical fracture of left femur, subsequent encounter for closed fracture with routine healing: Secondary | ICD-10-CM | POA: Diagnosis not present

## 2021-12-27 DIAGNOSIS — I2699 Other pulmonary embolism without acute cor pulmonale: Secondary | ICD-10-CM

## 2021-12-27 LAB — CBC WITH DIFFERENTIAL/PLATELET
Basophils Absolute: 0.1 10*3/uL (ref 0.0–0.1)
Basophils Relative: 0.8 % (ref 0.0–3.0)
Eosinophils Absolute: 0.3 10*3/uL (ref 0.0–0.7)
Eosinophils Relative: 3.9 % (ref 0.0–5.0)
HCT: 33.9 % — ABNORMAL LOW (ref 36.0–46.0)
Hemoglobin: 10.8 g/dL — ABNORMAL LOW (ref 12.0–15.0)
Lymphocytes Relative: 23.5 % (ref 12.0–46.0)
Lymphs Abs: 1.7 10*3/uL (ref 0.7–4.0)
MCHC: 32 g/dL (ref 30.0–36.0)
MCV: 88.4 fl (ref 78.0–100.0)
Monocytes Absolute: 0.6 10*3/uL (ref 0.1–1.0)
Monocytes Relative: 8.3 % (ref 3.0–12.0)
Neutro Abs: 4.6 10*3/uL (ref 1.4–7.7)
Neutrophils Relative %: 63.5 % (ref 43.0–77.0)
Platelets: 291 10*3/uL (ref 150.0–400.0)
RBC: 3.83 Mil/uL — ABNORMAL LOW (ref 3.87–5.11)
RDW: 15.5 % (ref 11.5–15.5)
WBC: 7.2 10*3/uL (ref 4.0–10.5)

## 2021-12-27 LAB — LIPID PANEL
Cholesterol: 161 mg/dL (ref 0–200)
HDL: 56.5 mg/dL (ref >39.00)
LDL Cholesterol: 75 mg/dL (ref 0–99)
NonHDL: 104.18
Total CHOL/HDL Ratio: 3
Triglycerides: 147 mg/dL (ref 0.0–149.0)
VLDL: 29.4 mg/dL (ref 0.0–40.0)

## 2021-12-27 LAB — COMPREHENSIVE METABOLIC PANEL
ALT: 18 U/L (ref 0–35)
AST: 22 U/L (ref 0–37)
Albumin: 4.2 g/dL (ref 3.5–5.2)
Alkaline Phosphatase: 138 U/L — ABNORMAL HIGH (ref 39–117)
BUN: 15 mg/dL (ref 6–23)
CO2: 32 mEq/L (ref 19–32)
Calcium: 9.7 mg/dL (ref 8.4–10.5)
Chloride: 103 mEq/L (ref 96–112)
Creatinine, Ser: 0.79 mg/dL (ref 0.40–1.20)
GFR: 72.08 mL/min (ref >60.00)
Glucose, Bld: 94 mg/dL (ref 70–99)
Potassium: 4.3 mEq/L (ref 3.5–5.1)
Sodium: 143 mEq/L (ref 135–145)
Total Bilirubin: 0.4 mg/dL (ref 0.2–1.2)
Total Protein: 6.8 g/dL (ref 6.0–8.3)

## 2021-12-27 LAB — HEMOGLOBIN A1C: Hgb A1c MFr Bld: 4.9 % (ref 4.6–6.5)

## 2021-12-27 LAB — TSH: TSH: 3.32 u[IU]/mL (ref 0.35–5.50)

## 2021-12-27 MED ORDER — APIXABAN 5 MG PO TABS: ORAL_TABLET | ORAL | 1 refills | Status: DC

## 2021-12-27 NOTE — Progress Notes (Signed)
Subjective:   By signing my name below, I, Kellie Simmering, attest that this documentation has been prepared under the direction and in the presence of Mosie Lukes, MD., 12/27/2021.     Patient ID: Shannon [name], female    DOB: 1945/11/07, 76 y.o.   MRN: [id number]  No chief complaint on file.  HPI Patient is in today for an office visit. Patient denies CP/ palp/ SOB/ HA/ congestion/fevers/GI or GU c/o.  Acute Pulmonary Embolism Patient was admitted to the ED on 11/29/2021 for an acute pulmonary embolism. No FHx of blood clots. She currently takes Eliquis 5 mg and is inquiring whether she can change this to a mail order pharmacy. Her breathing has been normal since the pulmonary embolism.  Left Hip Fracture She underwent total total hip arthroplasty on 11/21/2021 after sustaining a closed fracture of the left hip. She reports having pain that radiates down both legs, but this is improving. She uses an assisted walking device.   Memory Loss Donepezil 10 mg has been effective at treating her memory loss. She is interested in taking a new medication but will follow up with neurology.  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 Echo 2012: EF 45-50%;  b. Lex MV 2/12:  low risk, apical defect (small area of ischemia vs shifting breast atten);  c.  Echo 7/12: Normal wall thickness, EF 60-65%, normal wall motion, grade 1 diastolic dysfunction, mild LAE, PASP 32;   d. Lex MV 11/13:  EF 76%, no ischemia   OA (osteoarthritis) 09/07/2011   Obesity 09/28/2014   Osteoporosis 03/07/2011   DEXA T score -2.6 AP spine 03/07/11    Formatting of this note might be different from the original. Formatting of this note might be different from the original. DEXA T score -2.6 AP spine 03/07/11   Last Assessment & Plan:  Formatting of this note might be different from the original. Encouraged to get adequate exercise, calcium and vitamin d intake   Pain in joint, ankle and foot 09/28/2014  Palpitation 10/28/2020   Personal history of chemotherapy 2012   Personal history of radiation therapy 2012   Prediabetes 06/15/2009   Recurrent falls 12/28/2015   Right knee pain 07/10/2020   Shortness of breath    UTI (urinary tract infection) 03/03/2012   Vertebral fracture, osteoporotic, sequela 04/05/2016   Vitamin D deficiency 06/14/2017   Supplement and monitor   Past Surgical History:  Procedure Laterality Date   BREAST LUMPECTOMY  04/2009   RIGHT FOR BREAST CANCER-CHEMO/RADIATION X 1 YEAR   BREAST REDUCTION SURGERY Bilateral 05/04/2014   Procedure:  MAMMARY REDUCTION  (BREAST);  Surgeon: Cristine Polio, MD;  Location: [facility];  Service: Plastics;  Laterality: Bilateral;   CARPAL TUNNEL RELEASE     L wrist, ulnar nerve moved   CHOLECYSTECTOMY     CRANIOTOMY FOR TUMOR  2000   ELBOW SURGERY     left   KNEE ARTHROSCOPY Left 10/12/2014   Procedure: LEFT KNEE ARTHROSCOPY ;  Surgeon: Gaynelle Arabian, MD;  Location: [facility];  Service: Orthopedics;  Laterality: Left;   KYPHOPLASTY N/A 03/30/2016   Procedure: THORACIC 12 KYPHOPLASTY;  Surgeon: Phylliss Bob, MD;  Location: [facility];  Service: Orthopedics;  Laterality: N/A;  THORACIC 12 KYPHOPLASTY   LEFT HEART CATHETERIZATION WITH CORONARY ANGIOGRAM N/A 12/16/2013   Procedure: LEFT HEART CATHETERIZATION WITH CORONARY ANGIOGRAM;  Surgeon: Peter M [name], MD;  Location: [facility] CATH LAB;  Service: Cardiovascular;  Laterality: N/A;   LIPOMA EXCISION  03/28/2009   right leg   MELANOMA EXCISION     PORT-A-CATH REMOVAL  11/30/2010   Streck   porta cath     PORTACATH PLACEMENT  may 2011   REDUCTION MAMMAPLASTY Bilateral    SYNOVECTOMY Left 10/12/2014   Procedure: WITH SYNOVECTOMY;  Surgeon: Gaynelle Arabian, MD;  Location: [facility];  Service: Orthopedics;  Laterality: Left;   TONSILLECTOMY  1958   TOTAL HIP ARTHROPLASTY Left 11/21/2021   Procedure: TOTAL HIP ARTHROPLASTY ANTERIOR APPROACH;  Surgeon: Rod Can, MD;  Location: [facility];  Service: Orthopedics;  Laterality: Left;   TOTAL KNEE ARTHROPLASTY  02/05/2012   Procedure: TOTAL KNEE ARTHROPLASTY;  Surgeon: Gearlean Alf, MD;  Location: [facility];  Service: Orthopedics;  Laterality: Right;   TOTAL KNEE ARTHROPLASTY Left 02/10/2013   Procedure: LEFT TOTAL KNEE ARTHROPLASTY;  Surgeon: Gearlean Alf, MD;  Location: [facility];  Service: Orthopedics;  Laterality: Left;   total knee raplacement  01-2012   Right Knee   TOTAL KNEE REVISION Left 07/14/2020   Procedure: TOTAL KNEE REVISION;  Surgeon: Gaynelle Arabian, MD;  Location: [facility];  Service:  Orthopedics;  Laterality: Left;   TUBAL LIGATION  1997   Family History  Problem Relation Age of Onset   Breast cancer Mother        sarcoma   Lung cancer Mother    Hypertension Mother    Prostate cancer Father    Congestive Heart Failure Father    Heart attack Father    Prostate cancer Brother    Heart disease Maternal Grandfather        MI   Down syndrome Son    CVA Son    Stomach cancer Maternal Aunt    Dementia Maternal Aunt    Uterine cancer Maternal Aunt    Colon cancer Neg Hx    Social History   Socioeconomic History   Marital status: Married    Spouse name: Miryam Mcelhinney   Number of children: 3   Years of education: 44  Highest education level: High school graduate  Occupational History   Occupation: boutique owner-retired    Employer: [employer]  Tobacco Use   Smoking status: Never    Passive exposure: Never   Smokeless tobacco: Never  Vaping Use   Vaping Use: Never used  Substance and Sexual Activity   Alcohol use: No   Drug use: No   Sexual activity: Not Currently  Other Topics Concern   Not on file  Social History Narrative   Patient is right-handed. She lives with her husband in a 4 story home. They take care of their grown son with down syndrome who has had a stroke. She drinks 1-2 glasses of tea in the evenings. She does not formally exercise.      Social Determinants of Health   Financial Resource Strain: Low Risk  (07/25/2021)   Overall Financial Resource Strain (CARDIA)    Difficulty of Paying Living Expenses: Not very hard  Food Insecurity: No Food Insecurity (12/01/2021)   Hunger Vital Sign    Worried About Running Out of Food in the Last Year: Never true    Ran Out of Food in the Last Year: Never true  Transportation Needs: No Transportation Needs (12/01/2021)   PRAPARE - Hydrologist (Medical): No    Lack of Transportation (Non-Medical): No  Physical Activity: Inactive (12/30/2020)   Exercise Vital Sign     Days of Exercise per Week: 0 days    Minutes of Exercise per Session: 0 min  Stress: Stress Concern Present (07/25/2021)   [place]    Feeling of Stress : Rather much  Social Connections: Socially Integrated (12/30/2020)   Social Connection and Isolation Panel [NHANES]    Frequency of Communication with Friends and Family: More than three times a week    Frequency of Social Gatherings with Friends and Family: More than three times a week    Attends Religious Services: 1 to 4 times per year    Active Member of [employer] or Organizations: Yes    Attends Archivist Meetings: 1 to 4 times per year    Marital Status: Married  Human resources officer Violence: Not At Risk (12/01/2021)   Humiliation, Afraid, Rape, and Kick questionnaire    Fear of Current or Ex-Partner: No    Emotionally Abused: No    Physically Abused: No    Sexually Abused: No   Outpatient Medications Prior to Visit  Medication Sig Dispense Refill   acetaminophen (TYLENOL) 325 MG tablet Take 2 tablets (650 mg total) by mouth every 6 (six) hours as needed for mild pain or moderate pain (pain score 1-3 or temp > 100.5).     albuterol (VENTOLIN HFA) 108 (90 Base) MCG/ACT inhaler INHALE 1-2 PUFFS INTO THE LUNGS EVERY 6 (SIX) HOURS AS NEEDED FOR WHEEZING OR SHORTNESS OF BREATH. (Patient taking differently: Inhale 2 puffs into the lungs every 6 (six) hours as needed for wheezing or shortness of breath.) 18 each 3   apixaban (ELIQUIS) 5 MG TABS tablet Take 2 tabs (51m) twice a day for 1 week.  On 12/07/2021, start taking eliquis 5 mg 1 tab twice a day. 60 tablet    Calcium Carbonate (CALCIUM 600 PO) Take 1 tablet by mouth daily.     carvedilol (COREG) 25 MG tablet TAKE 1 TABLET TWICE DAILY WITH A MEAL (Patient taking differently: Take 25 mg by mouth 2 (two) times daily with a meal.) 180 tablet  3   cetirizine (ZYRTEC) 10 MG tablet Take 10 mg by mouth at bedtime.      cholecalciferol (VITAMIN D3) 25 MCG (1000 UNIT) tablet Take 1,000 Units by mouth daily.     diltiazem (CARDIZEM CD) 120 MG 24 hr capsule TAKE 1 CAPSULE BY MOUTH EVERY DAY (Patient taking differently: Take 120 mg by mouth daily.) 90 capsule 2   docusate sodium (COLACE) 100 MG capsule Take 1 capsule (100 mg total) by mouth 2 (two) times daily. 10 capsule 0   donepezil (ARICEPT) 10 MG tablet TAKE 1 TABLET AT BEDTIME (Patient taking differently: Take 10 mg by mouth at bedtime.) 90 tablet 1   famotidine (PEPCID) 20 MG tablet TAKE 1 TABLET TWICE DAILY 180 tablet 1   ferrous sulfate 325 (65 FE) MG tablet Take 325 mg by mouth daily with breakfast.     folic acid (FOLVITE) 1 MG tablet Take 1 tablet (1 mg total) by mouth daily. 90 tablet 0   gabapentin (NEURONTIN) 300 MG capsule Take 1 capsule (300 mg total) by mouth 2 (two) times daily. 60 capsule 5   HYDROcodone-acetaminophen (NORCO) 10-325 MG tablet Take 0.5 tablets by mouth every 4 (four) hours as needed for moderate pain or severe pain. 10 tablet 0   hydrOXYzine (ATARAX) 25 MG tablet TAKE 0.5-1 TABLETS (12.5-25 MG TOTAL) BY MOUTH 2 (TWO) TIMES DAILY AS NEEDED. (Patient taking differently: Take 25 mg by mouth 2 (two) times daily as needed (allergies).) 180 tablet 0   Multiple Vitamin (MULTIVITAMIN WITH MINERALS) TABS tablet Take 1 tablet by mouth daily.     omeprazole (PRILOSEC) 20 MG capsule Take 20 mg by mouth at bedtime.     pentosan polysulfate (ELMIRON) 100 MG capsule Take 200 mg by mouth in the morning and at bedtime.     polyethylene glycol (MIRALAX / GLYCOLAX) 17 g packet Take 17 g by mouth daily as needed for mild constipation. 14 each 0   rosuvastatin (CRESTOR) 20 MG tablet TAKE 1 TABLET AT BEDTIME (Patient taking differently: Take 20 mg by mouth at bedtime.) 90 tablet 3   traMADol (ULTRAM) 50 MG tablet Take 1 tablet (50 mg total) by mouth every 6 (six) hours as needed for moderate pain or severe pain. 10 tablet 0   venlafaxine (EFFEXOR) 75 MG  tablet Take 225 mg by mouth in the morning.     No facility-administered medications prior to visit.   Allergies  Allergen Reactions   Dilaudid [Hydromorphone Hcl] Itching and Other (See Comments)    "Went Crazy"    Review of Systems  Constitutional:  Negative for chills and fever.  HENT:  Negative for congestion.   Respiratory:  Negative for shortness of breath.   Cardiovascular:  Negative for chest pain and palpitations.  Gastrointestinal:  Negative for abdominal pain, blood in stool, constipation, diarrhea, nausea and vomiting.  Genitourinary:  Negative for dysuria, frequency, hematuria and urgency.  Musculoskeletal:        (+) bilateral hip pain.  Skin:           Neurological:  Negative for headaches.      Objective:    Physical Exam Constitutional:      General: She is not in acute distress.    Appearance: Normal appearance. She is normal weight. She is not ill-appearing.  HENT:     Head: Normocephalic and atraumatic.     Right Ear: External ear normal.     Left Ear: External ear normal.     Nose:  Nose normal.     Mouth/Throat:     Mouth: Mucous membranes are moist.     Pharynx: Oropharynx is clear.  Eyes:     General:        Right eye: No discharge.        Left eye: No discharge.     Extraocular Movements: Extraocular movements intact.     Pupils: Pupils are equal, round, and reactive to light.  Cardiovascular:     Rate and Rhythm: Normal rate and regular rhythm.     Pulses: Normal pulses.     Heart sounds: Normal heart sounds. No murmur heard.    No gallop.  Pulmonary:     Effort: Pulmonary effort is normal. No respiratory distress.     Breath sounds: Normal breath sounds. No wheezing or rales.  Abdominal:     General: Bowel sounds are normal.     Palpations: Abdomen is soft.     Tenderness: There is no abdominal tenderness. There is no guarding.  Musculoskeletal:        General: Normal range of motion.     Cervical back: Normal range of motion.      Right lower leg: No edema.     Left lower leg: No edema.  Skin:    General: Skin is warm and dry.  Neurological:     Mental Status: She is alert and oriented to person, place, and time.  Psychiatric:        Mood and Affect: Mood normal.        Behavior: Behavior normal.        Judgment: Judgment normal.    LMP 01/30/1994  Wt Readings from Last 3 Encounters:  12/01/21 171 lb 9.6 oz (77.8 kg)  11/25/21 171 lb 9.6 oz (77.8 kg)  10/04/21 173 lb 1.6 oz (78.5 kg)   Diabetic Foot Exam - Simple   No data filed    Lab Results  Component Value Date   WBC 8.4 12/01/2021   HGB 9.7 (L) 12/01/2021   HCT 31.2 (L) 12/01/2021   PLT 322 12/01/2021   GLUCOSE 102 (H) 12/01/2021   CHOL 167 06/23/2021   TRIG 236.0 (H) 06/23/2021   HDL 49.90 06/23/2021   LDLDIRECT 89.0 06/23/2021   LDLCALC 80 06/05/2018   ALT 30 11/29/2021   AST 39 11/29/2021   NA 139 12/01/2021   K 3.9 12/01/2021   CL 106 12/01/2021   CREATININE 0.70 12/01/2021   BUN 12 12/01/2021   CO2 27 12/01/2021   TSH 3.97 06/23/2021   INR 1.1 11/29/2021   HGBA1C 5.6 06/23/2021   Lab Results  Component Value Date   TSH 3.97 06/23/2021   Lab Results  Component Value Date   WBC 8.4 12/01/2021   HGB 9.7 (L) 12/01/2021   HCT 31.2 (L) 12/01/2021   MCV 93.4 12/01/2021   PLT 322 12/01/2021   Lab Results  Component Value Date   NA 139 12/01/2021   K 3.9 12/01/2021   CHLORIDE 107 08/07/2016   CO2 27 12/01/2021   GLUCOSE 102 (H) 12/01/2021   BUN 12 12/01/2021   CREATININE 0.70 12/01/2021   BILITOT 0.7 11/29/2021   ALKPHOS 75 11/29/2021   AST 39 11/29/2021   ALT 30 11/29/2021   PROT 6.0 (L) 11/29/2021   ALBUMIN 3.2 (L) 11/29/2021   CALCIUM 8.9 12/01/2021   ANIONGAP 6 12/01/2021   EGFR 72 (L) 08/07/2016   GFR 59.48 (L) 06/23/2021   Lab Results  Component Value Date  CHOL 167 06/23/2021   Lab Results  Component Value Date   HDL 49.90 06/23/2021   Lab Results  Component Value Date   LDLCALC 80 06/05/2018    Lab Results  Component Value Date   TRIG 236.0 (H) 06/23/2021   Lab Results  Component Value Date   CHOLHDL 3 06/23/2021   Lab Results  Component Value Date   HGBA1C 5.6 06/23/2021      Assessment & Plan:   Eliquis: Her Eliquis 5 mg has been refilled.  Labs: Routine lab work will be performed today.  Problem List Items Addressed This Visit       Cardiovascular and Mediastinum   Essential hypertension - Primary   Pulmonary embolism ([place])     Nervous and Auditory   Dementia ([place])     Other   Mixed hyperlipidemia   No orders of the defined types were placed in this encounter.  I, Kellie Simmering, personally preformed the services described in this documentation.  All medical record entries made by the scribe were at my direction and in my presence.  I have reviewed the chart and discharge instructions (if applicable) and agree that the record reflects my personal performance and is accurate and complete. 12/27/2021  I,Mohammed Iqbal,acting as a scribe for Penni Homans, MD.,have documented all relevant documentation on the behalf of Penni Homans, MD,as directed by  Penni Homans, MD while in the presence of Penni Homans, MD.  Kellie Simmering

## 2021-12-27 NOTE — Patient Instructions (Addendum)
Pulmonary Embolism  A pulmonary embolism (PE) is a sudden blockage or decrease of blood flow in one or both lungs that happens when a clot travels into the arteries of the lung (pulmonary arteries). Most blockages come from a blood clot that forms in the vein of a leg or arm (deep vein thrombosis, DVT) and travels to the lungs. A clot is blood that has thickened into a gel or solid. PE is a dangerous and life-threatening condition that needs to be treated right away. What are the causes? This condition is usually caused by a blood clot that forms in a vein and moves to the lungs. In rare cases, it may be caused by air, fat, part of a tumor, or other tissue that moves through the veins and into the lungs. What increases the risk? The following factors may make you more likely to develop this condition: Experiencing a traumatic injury, such as breaking a hip or leg. Having: A spinal cord injury. Major surgery, especially hip or knee replacement, or surgery on parts of the nervous system or on the abdomen. A stroke. A blood-clotting disease. Long-term (chronic) lung or heart disease. Cancer, especially if you are being treated with chemotherapy. A central venous catheter. Taking medicines that contain estrogen. These include birth control pills and hormone replacement therapy. Being: Pregnant. In the period of time after your baby is delivered (postpartum). Older than age 60. Overweight. A smoker, especially if you have other risks. Not very active (sedentary), not being able to move at all, or spending long periods sitting, such as travel over 6 hours. You are also at a greater risk if you have a leg in a cast or splint. What are the signs or symptoms? Symptoms of this condition usually start suddenly and include: Shortness of breath during activity or at rest. Coughing, coughing up blood, or coughing up bloody mucus. Chest pain, back pain, or shoulder blade pain that gets worse with deep  breaths. Rapid or irregular heartbeat. Feeling light-headed or dizzy, or fainting. Feeling anxious. Pain and swelling in a leg. This is a symptom of DVT, which can lead to PE. How is this diagnosed? This condition may be diagnosed based on your medical history, a physical exam, and tests. Tests may include: Blood tests. An ECG (electrocardiogram) of the heart. A CT pulmonary angiogram. This test checks blood flow in and around your lungs. A ventilation-perfusion scan, also called a lung VQ scan. This test measures air flow and blood flow to the lungs. An ultrasound to check for a DVT. How is this treated? Treatment for this condition depends on many factors, such as the cause of your PE, your risk for bleeding or developing more clots, and other medical conditions you may have. Treatment aims to stop blood clots from forming or growing larger. In some cases, treatment may be aimed at breaking apart or removing the blood clot. Treatment may include: Medicines, such as: Blood thinning medicines, also called anticoagulants, to stop clots from forming and growing. Medicines that break apart clots (fibrinolytics). Procedures, such as: Using a flexible tube to remove a blood clot (embolectomy) or to deliver medicine to destroy it (catheter-directed thrombolysis). Surgery to remove the clot (surgical embolectomy). This is rare. You may need a combination of immediate, long-term, and extended treatments. Your treatment may continue for several months (maintenance therapy) or longer depending on your medical conditions. You and your health care provider will work together to choose the treatment program that is best for you.   Follow these instructions at home: Medicines Take over-the-counter and prescription medicines only as told by your health care provider. If you are taking blood thinners: Talk with your health care provider before you take any medicines that contain aspirin or NSAIDs, such as  ibuprofen. These medicines increase your risk for dangerous bleeding. Take your medicine exactly as told, at the same time every day. Avoid activities that could cause injury or bruising, and follow instructions about how to prevent falls. Wear a medical alert bracelet or carry a card that lists what medicines you take. Understand what foods and drugs interact with any medicines that you are taking. General instructions Ask your health care provider when you may return to your normal activities. Avoid sitting or lying for a long time without moving. Maintain a healthy weight. Ask your health care provider what weight is healthy for you. Do not use any products that contain nicotine or tobacco. These products include cigarettes, chewing tobacco, and vaping devices, such as e-cigarettes. If you need help quitting, ask your health care provider. Talk with your health care provider about any travel plans. It is important to make sure that you are still able to take your medicine while traveling. Keep all follow-up visits. This is important. Where to find more information American Lung Association: www.lung.org Centers for Disease Control and Prevention: www.cdc.gov Contact a health care provider if: You missed a dose of your blood thinner medicine. You have a fever. Get help right away if: You have: New or increased pain, swelling, warmth, or redness in an arm or leg. Shortness of breath that gets worse during activity or at rest. Worsening chest pain. A rapid or irregular heartbeat. A severe headache. Vision changes. A serious fall or accident, or you hit your head. Blood in your vomit, stool, or urine. A cut that will not stop bleeding. You cough up blood. You feel light-headed or dizzy, and that feeling does not go away. You cannot move your arms or legs. You are confused or have memory loss. These symptoms may represent a serious problem that is an emergency. Do not wait to see if the  symptoms will go away. Get medical help right away. Call your local emergency services (911 in the [place] Do not drive yourself to the hospital. Summary A pulmonary embolism (PE) is a serious and potentially life-threatening condition. It happens when a blood clot from one part of the body travels to the arteries of the lung, causing a sudden blockage or decrease of blood flow to the lungs. This may result in shortness of breath, chest pain, dizziness, and fainting. Treatments for this condition usually include medicines to thin your blood (anticoagulants) or medicines to break apart blood clots. If you are given blood thinners, take your medicine exactly as told by your health care provider, at the same time every day. This is important. Understand what foods and drugs interact with any medicines that you are taking. If you have signs of PE or DVT, call your local emergency services (911 in the [place] This information is not intended to replace advice given to you by your health care provider. Make sure you discuss any questions you have with your health care provider. Document Revised: 12/19/2019 Document Reviewed: 12/19/2019 Elsevier Patient Education  2023 Elsevier Inc.  

## 2021-12-28 ENCOUNTER — Other Ambulatory Visit: Payer: Self-pay

## 2021-12-28 DIAGNOSIS — Z471 Aftercare following joint replacement surgery: Secondary | ICD-10-CM | POA: Diagnosis not present

## 2021-12-28 DIAGNOSIS — E559 Vitamin D deficiency, unspecified: Secondary | ICD-10-CM

## 2021-12-28 DIAGNOSIS — S72002D Fracture of unspecified part of neck of left femur, subsequent encounter for closed fracture with routine healing: Secondary | ICD-10-CM | POA: Diagnosis not present

## 2021-12-28 LAB — VITAMIN D 25 HYDROXY (VIT D DEFICIENCY, FRACTURES): VITD: 44.5 ng/mL (ref 30.00–100.00)

## 2021-12-28 NOTE — Assessment & Plan Note (Signed)
[id number] acceptable, minimize simple carbs. Increase exercise as tolerated.  

## 2021-12-28 NOTE — Assessment & Plan Note (Signed)
She is feeling less anxious and blue about her current state on current meds.

## 2022-01-03 ENCOUNTER — Telehealth: Payer: Self-pay | Admitting: Family Medicine

## 2022-01-03 ENCOUNTER — Other Ambulatory Visit: Payer: Self-pay

## 2022-01-03 MED ORDER — APIXABAN 5 MG PO TABS: ORAL_TABLET | ORAL | 0 refills | Status: DC

## 2022-01-03 NOTE — Telephone Encounter (Signed)
Patient's daughter called to advise that mom will run out of Eliquis before the mail order gets prescription to her and she wants to know if a 30 day supply can be sent in to [facility] in [facility].

## 2022-01-03 NOTE — Telephone Encounter (Signed)
[facility] called and stated there was some confusion on the directions for apixaban (ELIQUIS) 5 MG TABS tablet. They stated we can call them at [phone] or refax the rx.

## 2022-01-03 NOTE — Telephone Encounter (Signed)
Medication was sent

## 2022-01-04 ENCOUNTER — Other Ambulatory Visit: Payer: Self-pay

## 2022-01-04 DIAGNOSIS — Z471 Aftercare following joint replacement surgery: Secondary | ICD-10-CM | POA: Diagnosis not present

## 2022-01-04 DIAGNOSIS — S72002D Fracture of unspecified part of neck of left femur, subsequent encounter for closed fracture with routine healing: Secondary | ICD-10-CM | POA: Diagnosis not present

## 2022-01-04 NOTE — Telephone Encounter (Signed)
Sent to [employer]

## 2022-01-05 ENCOUNTER — Other Ambulatory Visit: Payer: Self-pay

## 2022-01-05 MED ORDER — APIXABAN 5 MG PO TABS: 5.0000 mg | ORAL_TABLET | Freq: Two times a day (BID) | ORAL | 2 refills | Status: DC

## 2022-01-07 ENCOUNTER — Other Ambulatory Visit: Payer: Self-pay | Admitting: Cardiology

## 2022-01-07 DIAGNOSIS — R002 Palpitations: Secondary | ICD-10-CM

## 2022-01-09 DIAGNOSIS — S72002D Fracture of unspecified part of neck of left femur, subsequent encounter for closed fracture with routine healing: Secondary | ICD-10-CM | POA: Diagnosis not present

## 2022-01-09 DIAGNOSIS — Z471 Aftercare following joint replacement surgery: Secondary | ICD-10-CM | POA: Diagnosis not present

## 2022-01-11 DIAGNOSIS — S72002D Fracture of unspecified part of neck of left femur, subsequent encounter for closed fracture with routine healing: Secondary | ICD-10-CM | POA: Diagnosis not present

## 2022-01-11 DIAGNOSIS — Z471 Aftercare following joint replacement surgery: Secondary | ICD-10-CM | POA: Diagnosis not present

## 2022-01-12 ENCOUNTER — Ambulatory Visit: Payer: Medicare Other | Admitting: Psychology

## 2022-01-13 ENCOUNTER — Encounter: Payer: Self-pay | Admitting: Physician Assistant

## 2022-01-13 ENCOUNTER — Ambulatory Visit: Payer: Medicare Other | Admitting: Physician Assistant

## 2022-01-13 DIAGNOSIS — Z029 Encounter for administrative examinations, unspecified: Secondary | ICD-10-CM

## 2022-01-13 NOTE — Progress Notes (Signed)
Assessment/Plan:   Memory difficulties  Shannon [name] is a very pleasant 76 y.o. RH female CHF, hypertension, hyperlipidemia, sleep apnea, GERD, anemia, history of breast cancer diagnosed in 2014, osteoporosis, recent mechanical fall with closed left hip fracture status post left total hip arthroplasty 11/21/2021, recent RLL segmental and subsegmental PE, with right heart strain while on rehab, my goodness requiring hospitalization on 11/29/2021 now on Eliquis for 6 months,***presenting today in follow-up for evaluation of memory loss. Patient is on donepezil 10 mg daily***     Recommendations:   Follow up in   months. Continue to control mood as per PCP Continue good control of cardiovascular risk factors in view of her recent pulmonary embolism, continue Eliquis Continue donepezil 10 mg daily    Subjective:   This patient is accompanied in the office by ***  who supplements the history. Previous records as well as any outside records available were reviewed prior to todays visit.  ***She was last seen on 11/28/2021 at which time her MoCA was 30/30.    Any changes in memory since last visit?  She has subjective complaints, stating that she cannot remember the names of people provoking anxiety, but then after 20 seconds "it comes back ".  To try to improve her memory she does crossword puzzles and word finding.  She does not like to do activities outside of the house, she is concerned about "how other people perceive me, my friends are no longer my friends, they do not want to interact with me because I feel that my dementia is contagious ".***Her husband does not feel that her memory is worse. Repeats oneself?  Endorsed Disoriented when walking into a room?  Patient denies except occasionally not remembering what patient came to the room for ***  Leaving objects in unusual places?  Patient denies   Wandering behavior?  Patient denies   Any personality changes since last visit?   Sometimes she is more irritable. Any worsening depression?:  Patient denies*** Hallucinations or paranoia?  Patient denies   Seizures?   Patient denies    Any sleep changes?  Denies  vivid dreams, REM behavior or sleepwalking   Sleep apnea?  Patient denies   Any hygiene concerns?  Patient denies   Independent of bathing and dressing?  Endorsed  Does the patient needs help with medications? is in charge *** Who is in charge of the finances?   is in charge   *** Any changes in appetite?  Patient denies ***   Patient have trouble swallowing? Patient denies   Does the patient cook?  Any kitchen accidents such as leaving the stove on? Patient denies   Any headaches?  Patient denies   Chronic back pain Patient denies   Ambulates with difficulty?   Patient denies   Recent falls or head injuries?  Patient denies     Unilateral weakness, numbness or tingling?  She has a history of some tingling in the lower extremities, with negative EMG NCS.  She takes gabapentin with some relief.  Any tremors?  Patient denies   Any anosmia?  Patient denies   Any incontinence of urine?  Patient denies   Any bowel dysfunction?   Patient denies      Patient lives  ***     Last office visit 2021 had been a difficult year emotionally for her.  She is caring for her husband with dementia.  Her son passed away.  She started having headaches in December 2021.  It followed cataract surgery, so she thought it was due to the eye drops but it persisted after stopping the drops.  They were severe daily headaches that moved around different locations on her head.  She has had some memory problems for a couple of years which has been noticeable to herself and family over the past year.  When talking about recent events, she will forget, such as where she had lunch or what she did that day.  She will repeat herself 5 to 10 times during the same conversation.  Her daughter started managing her medication because she would sometimes  forget to take her medications despite using a pill box.  So far, she has been paying her bills without difficulty.  She drives but denies difficulty or disorientation.  One time, she thought that she was playing hide and seek with her granddaughter who was not there.  On 02/18/2020, she exhibited altered mental status.  She thought her mother who is long passed, was in the house cooking dinner. She went to the ED for further evaluation.  CT head personally reviewed unremarkable.  UA showed possible UTI and was prescribed Keflex.  CXR negative and labs negative for leukocytosis or electrolyte imbalance.  She was also dehydrated.  Followed up with primary care.  MRI of brain without contrast on 03/02/2020 personally reviewed showed mild cerebral atrophy with chronic small vessel ischemic changes and sequelae of prior right pterional craniotomy with no visible tumor recurrence but no acute abnormality.  Headaches now are infrequent, mild and lasting a couple of hours about 2 days a week.  TSH from January was 2.26.       MRI brain 04/2020 No acute intracranial abnormality or significant interval change. 2. Stable mild atrophy and white matter disease. This likely reflects the sequela of chronic microvascular ischemia. 3. No residual or recurrent sellar mass. Right pterional craniotomy noted.     Extensive electrodiagnostic testing of the right lower extremity and additional studies of the left shows:  Right superficial peroneal and left sural sensory responses are absent.  Right sural and left superficial peroneal sensory responses are within normal limits. Bilateral peroneal motor responses show reduced amplitude at the extensor digitorum brevis, and is normal at the tibialis anterior.  Bilateral tibial motor responses are within normal limits. Bilateral tibial H reflex studies are within normal limits. There is no evidence of active or chronic motor axonal loss changes affecting any of the tested muscles.   Motor unit configuration and recruitment pattern is within normal limits.  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 Echo 2012: EF 45-50%;  b. Lex MV 2/12:  low risk, apical defect (small area of ischemia vs shifting breast atten);  c.  Echo 7/12: Normal wall thickness, EF 60-65%, normal wall motion, grade 1 diastolic dysfunction, mild LAE, PASP 32;   d. Lex MV 11/13:  EF 76%, no ischemia   OA (osteoarthritis) 09/07/2011   Obesity 09/28/2014   Osteoporosis 03/07/2011   DEXA T score -2.6 AP spine 03/07/11    Formatting of this note might be different  from the original. Formatting of this note might be different from the original. DEXA T score -2.6 AP spine 03/07/11   Last Assessment & Plan:  Formatting of this note might be different from the original. Encouraged to get adequate exercise, calcium and vitamin d intake   Pain in joint, ankle and foot 09/28/2014   Palpitation 10/28/2020   Personal history of chemotherapy 2012   Personal history of radiation therapy 2012   Prediabetes 06/15/2009   Recurrent falls 12/28/2015   Right knee pain 07/10/2020   Shortness of breath    UTI (urinary tract infection) 03/03/2012   Vertebral fracture, osteoporotic, sequela 04/05/2016   Vitamin D deficiency 06/14/2017   Supplement and monitor     Past Surgical History:  Procedure Laterality Date   BREAST LUMPECTOMY  04/2009   RIGHT FOR BREAST CANCER-CHEMO/RADIATION X 1 YEAR   BREAST REDUCTION SURGERY Bilateral 05/04/2014   Procedure: MAMMARY REDUCTION  (BREAST);  Surgeon: Cristine Polio, MD;  Location: [facility];  Service: Plastics;  Laterality: Bilateral;   CARPAL TUNNEL RELEASE     L wrist, ulnar nerve moved   CHOLECYSTECTOMY     CRANIOTOMY FOR TUMOR  2000   ELBOW SURGERY     left   KNEE ARTHROSCOPY Left 10/12/2014   Procedure: LEFT KNEE ARTHROSCOPY ;  Surgeon: Gaynelle Arabian, MD;  Location: [facility];  Service: Orthopedics;  Laterality: Left;   KYPHOPLASTY N/A 03/30/2016   Procedure: THORACIC 12 KYPHOPLASTY;  Surgeon: Phylliss Bob, MD;  Location: [facility];  Service: Orthopedics;  Laterality: N/A;  THORACIC 12 KYPHOPLASTY   LEFT HEART CATHETERIZATION WITH CORONARY ANGIOGRAM N/A 12/16/2013   Procedure: LEFT HEART CATHETERIZATION WITH CORONARY ANGIOGRAM;  Surgeon: Peter M [name], MD;  Location: [facility];  Service: Cardiovascular;  Laterality: N/A;   LIPOMA EXCISION  03/28/2009   right leg   MELANOMA EXCISION     PORT-A-CATH REMOVAL  11/30/2010   Streck   porta cath     PORTACATH PLACEMENT  may 2011   REDUCTION MAMMAPLASTY Bilateral     SYNOVECTOMY Left 10/12/2014   Procedure: WITH SYNOVECTOMY;  Surgeon: Gaynelle Arabian, MD;  Location: [facility];  Service: Orthopedics;  Laterality: Left;   TONSILLECTOMY  1958   TOTAL HIP ARTHROPLASTY Left 11/21/2021   Procedure: TOTAL HIP ARTHROPLASTY ANTERIOR APPROACH;  Surgeon: Rod Can, MD;  Location: [facility];  Service: Orthopedics;  Laterality: Left;   TOTAL KNEE ARTHROPLASTY  02/05/2012  Procedure: TOTAL KNEE ARTHROPLASTY;  Surgeon: Gearlean Alf, MD;  Location: [facility];  Service: Orthopedics;  Laterality: Right;   TOTAL KNEE ARTHROPLASTY Left 02/10/2013   Procedure: LEFT TOTAL KNEE ARTHROPLASTY;  Surgeon: Gearlean Alf, MD;  Location: [facility];  Service: Orthopedics;  Laterality: Left;   total knee raplacement  01-2012   Right Knee   TOTAL KNEE REVISION Left 07/14/2020   Procedure: TOTAL KNEE REVISION;  Surgeon: Gaynelle Arabian, MD;  Location: [facility];  Service: Orthopedics;  Laterality: Left;   TUBAL LIGATION  1997     PREVIOUS MEDICATIONS:   CURRENT MEDICATIONS:  Outpatient Encounter Medications as of 01/13/2022  Medication Sig   apixaban (ELIQUIS) 5 MG TABS tablet Take 1 tablet (5 mg total) by mouth 2 (two) times daily.   acetaminophen (TYLENOL) 325 MG tablet Take 2 tablets (650 mg total) by mouth every 6 (six) hours as needed for mild pain or moderate pain (pain score 1-3 or temp > 100.5).   albuterol (VENTOLIN HFA) 108 (90 Base) MCG/ACT inhaler INHALE 1-2 PUFFS INTO THE LUNGS EVERY 6 (SIX) HOURS AS NEEDED FOR WHEEZING OR SHORTNESS OF BREATH. (Patient taking differently: Inhale 2 puffs into the lungs every 6 (six) hours as needed for wheezing or shortness of breath.)   Calcium Carbonate (CALCIUM 600 PO) Take 1 tablet by mouth daily.   carvedilol (COREG) 25 MG tablet TAKE 1 TABLET TWICE DAILY WITH A MEAL (Patient taking differently: Take 25 mg by mouth 2 (two) times daily with a meal.)   cetirizine (ZYRTEC) 10 MG tablet Take 10 mg by mouth at bedtime.   cholecalciferol (VITAMIN  D3) 25 MCG (1000 UNIT) tablet Take 1,000 Units by mouth daily.   diltiazem (CARDIZEM CD) 120 MG 24 hr capsule TAKE 1 CAPSULE EVERY DAY   docusate sodium (COLACE) 100 MG capsule Take 1 capsule (100 mg total) by mouth 2 (two) times daily.   donepezil (ARICEPT) 10 MG tablet TAKE 1 TABLET AT BEDTIME (Patient taking differently: Take 10 mg by mouth at bedtime.)   famotidine (PEPCID) 20 MG tablet TAKE 1 TABLET TWICE DAILY   ferrous sulfate 325 (65 FE) MG tablet Take 325 mg by mouth daily with breakfast.   folic acid (FOLVITE) 1 MG tablet Take 1 tablet (1 mg total) by mouth daily.   gabapentin (NEURONTIN) 300 MG capsule Take 1 capsule (300 mg total) by mouth 2 (two) times daily.   HYDROcodone-acetaminophen (NORCO) 10-325 MG tablet Take 0.5 tablets by mouth every 4 (four) hours as needed for moderate pain or severe pain.   hydrOXYzine (ATARAX) 25 MG tablet TAKE 0.5-1 TABLETS (12.5-25 MG TOTAL) BY MOUTH 2 (TWO) TIMES DAILY AS NEEDED. (Patient taking differently: Take 25 mg by mouth 2 (two) times daily as needed (allergies).)   Multiple Vitamin (MULTIVITAMIN WITH MINERALS) TABS tablet Take 1 tablet by mouth daily.   omeprazole (PRILOSEC) 20 MG capsule Take 20 mg by mouth at bedtime.   pentosan polysulfate (ELMIRON) 100 MG capsule Take 200 mg by mouth in the morning and at bedtime.   polyethylene glycol (MIRALAX / GLYCOLAX) 17 g packet Take 17 g by mouth daily as needed for mild constipation.   rosuvastatin (CRESTOR) 20 MG tablet TAKE 1 TABLET AT BEDTIME (Patient taking differently: Take 20 mg by mouth at bedtime.)   traMADol (ULTRAM) 50 MG tablet Take 1 tablet (50 mg total) by mouth every 6 (six) hours as needed for moderate pain or severe pain.   venlafaxine (EFFEXOR) 75 MG tablet Take  225 mg by mouth in the morning.   No facility-administered encounter medications on file as of 01/13/2022.     Objective:     PHYSICAL EXAMINATION:    VITALS:  There were no vitals filed for this visit.  GEN:  The  patient appears stated age and is in NAD. HEENT:  Normocephalic, atraumatic.   Neurological examination:  General: NAD, well-groomed, appears stated age. Orientation: The patient is alert. Oriented to person, place and date Cranial nerves: There is good facial symmetry.The speech is fluent and clear. No aphasia or dysarthria. Fund of knowledge is appropriate. Recent memory impaired and remote memory is normal.  Attention and concentration are normal.  Able to name objects and repeat phrases.  Hearing is intact to conversational tone.    Sensation: Sensation is intact to light touch throughout Motor: Strength is at least antigravity x4. Tremors: none  DTR's 2/4 in UE/LE       No data to display             11/28/2021    1:38 PM  MMSE - Mini Mental State Exam  Orientation to time 5  Orientation to Place 5  Registration 3  Attention/ Calculation 5  Recall 3  Language- name 2 objects 2  Language- repeat 1  Language- follow 3 step command 3  Language- read & follow direction 1  Write a sentence 1  Copy design 1  Total score 30       Movement examination: Tone: There is normal tone in the UE/LE Abnormal movements:  no tremor.  No myoclonus.  No asterixis.   Coordination:  There is no decremation with RAM's. Normal finger to nose  Gait and Station: The patient has no difficulty arising out of a deep-seated chair without the use of the hands. The patient's stride length is good.  Gait is cautious and narrow.   Thank you for allowing [place] the opportunity to participate in the care of this nice patient. Please do not hesitate to contact us for any questions or concerns.   Total time spent on today's visit was *** minutes dedicated to this patient today, preparing to see patient, examining the patient, ordering tests and/or medications and counseling the patient, documenting clinical information in the EHR or other health record, independently interpreting results and communicating  results to the patient/family, discussing treatment and goals, answering patient's questions and coordinating care.  Cc:  Mosie Lukes, MD  Sharene Butters 01/13/2022 7:19 AM

## 2022-01-19 DIAGNOSIS — Z471 Aftercare following joint replacement surgery: Secondary | ICD-10-CM | POA: Diagnosis not present

## 2022-01-19 DIAGNOSIS — S72002D Fracture of unspecified part of neck of left femur, subsequent encounter for closed fracture with routine healing: Secondary | ICD-10-CM | POA: Diagnosis not present

## 2022-01-25 DIAGNOSIS — Z471 Aftercare following joint replacement surgery: Secondary | ICD-10-CM | POA: Diagnosis not present

## 2022-01-25 DIAGNOSIS — S72002D Fracture of unspecified part of neck of left femur, subsequent encounter for closed fracture with routine healing: Secondary | ICD-10-CM | POA: Diagnosis not present

## 2022-02-02 DIAGNOSIS — Z471 Aftercare following joint replacement surgery: Secondary | ICD-10-CM | POA: Diagnosis not present

## 2022-02-02 DIAGNOSIS — S72002D Fracture of unspecified part of neck of left femur, subsequent encounter for closed fracture with routine healing: Secondary | ICD-10-CM | POA: Diagnosis not present

## 2022-02-07 DIAGNOSIS — M7062 Trochanteric bursitis, left hip: Secondary | ICD-10-CM | POA: Diagnosis not present

## 2022-02-07 DIAGNOSIS — S72032D Displaced midcervical fracture of left femur, subsequent encounter for closed fracture with routine healing: Secondary | ICD-10-CM | POA: Diagnosis not present

## 2022-02-08 ENCOUNTER — Ambulatory Visit: Payer: Medicare Other | Admitting: Psychology

## 2022-02-10 ENCOUNTER — Encounter: Payer: Self-pay | Admitting: Physician Assistant

## 2022-02-10 ENCOUNTER — Ambulatory Visit (INDEPENDENT_AMBULATORY_CARE_PROVIDER_SITE_OTHER): Payer: Medicare Other | Admitting: Physician Assistant

## 2022-02-10 VITALS — BP 155/82 | HR 70 | Ht 64.0 in | Wt 163.0 lb

## 2022-02-10 DIAGNOSIS — R4189 Other symptoms and signs involving cognitive functions and awareness: Secondary | ICD-10-CM

## 2022-02-10 NOTE — Patient Instructions (Signed)
It was a pleasure to see you today at our office.   Recommendations:  Follow up in  6 months Continue donepezil 10 mg daily  Continue behavioral therapy for mood   Whom to call:  Memory  decline, memory medications: Call our office [phone]   For psychiatric meds, mood meds: Please have your primary care physician manage these medications.       For assessment of decision of mental capacity and competency:  Call Dr. Anthoney Harada, geriatric psychiatrist at [phone]  For guidance in geriatric dementia issues please call [facility] [phone]     If you have any severe symptoms of a stroke, or other severe issues such as confusion,severe chills or fever, etc call 911 or go to the ER as you may need to be evaluated further   Feel free to visit Facebook page " [email or web address]" for tips of how to care for people with memory problems.     RECOMMENDATIONS FOR ALL PATIENTS WITH MEMORY PROBLEMS: 1. Continue to exercise (Recommend 30 minutes of walking everyday, or 3 hours every week) 2. Increase social interactions - continue going to [place] and enjoy social gatherings with friends and family 3. Eat healthy, avoid fried foods and eat more fruits and vegetables 4. Maintain adequate blood pressure, blood sugar, and blood cholesterol level. Reducing the risk of stroke and cardiovascular disease also helps promoting better memory. 5. Avoid stressful situations. Live a simple life and avoid aggravations. Organize your time and prepare for the next day in anticipation. 6. Sleep well, avoid any interruptions of sleep and avoid any distractions in the bedroom that may interfere with adequate sleep quality 7. Avoid sugar, avoid sweets as there is a strong link between excessive sugar intake, diabetes, and cognitive impairment We discussed the Mediterranean diet, which has been shown to help patients reduce the risk of progressive memory disorders and reduces cardiovascular risk. This  includes eating fish, eat fruits and green leafy vegetables, nuts like almonds and hazelnuts, walnuts, and also use olive oil. Avoid fast foods and fried foods as much as possible. Avoid sweets and sugar as sugar use has been linked to worsening of memory function.  There is always a concern of gradual progression of memory problems. If this is the case, then we may need to adjust level of care according to patient needs. Support, both to the patient and caregiver, should then be put into place.    FALL PRECAUTIONS: Be cautious when walking. Scan the area for obstacles that may increase the risk of trips and falls. When getting up in the mornings, sit up at the edge of the bed for a few minutes before getting out of bed. Consider elevating the bed at the head end to avoid drop of blood pressure when getting up. Walk always in a well-lit room (use night lights in the walls). Avoid area rugs or power cords from appliances in the middle of the walkways. Use a walker or a cane if necessary and consider physical therapy for balance exercise. Get your eyesight checked regularly.  FINANCIAL OVERSIGHT: Supervision, especially oversight when making financial decisions or transactions is also recommended.  HOME SAFETY: Consider the safety of the kitchen when operating appliances like stoves, microwave oven, and blender. Consider having supervision and share cooking responsibilities until no longer able to participate in those. Accidents with firearms and other hazards in the house should be identified and addressed as well.   ABILITY TO BE LEFT ALONE: If patient is unable  to contact 911 operator, consider using [employer], or when the need is there, arrange for someone to stay with patients. Smoking is a fire hazard, consider supervision or cessation. Risk of wandering should be assessed by caregiver and if detected at any point, supervision and safe proof recommendations should be instituted.  MEDICATION  SUPERVISION: Inability to self-administer medication needs to be constantly addressed. Implement a mechanism to ensure safe administration of the medications.   DRIVING: Regarding driving, in patients with progressive memory problems, driving will be impaired. We advise to have someone else do the driving if trouble finding directions or if minor accidents are reported. Independent driving assessment is available to determine safety of driving.   If you are interested in the driving assessment, you can contact the following:  [employer] in [place]  [place] [phone]  [place]  [facility] [phone] or [phone]

## 2022-02-10 NOTE — Progress Notes (Signed)
Assessment/Plan:   Memory Difficulties   Shannon [name] is a very pleasant 77 y.o. RH female presenting today in follow-up for evaluation of memory loss. Patient is on donepezil 10 mg nightly tolerating well . There is a component of anxiety to her complaints and she acknowledges. She had been referred to psychiatry in the past but patient reports that she never followed up. She is now interested, and she is going to discuss this with her primary physician. Her MMSE today is 29/30, no significant changes are noted      Recommendations:   Follow up in 6  months. Continue donepezil 10 mg daily. Side effects were discussed  Recommend good control of cardiovascular risk factors.   Continue to control mood as per PCP Agree with psychiatric evaluation for major depression /adjustment disorder    Subjective:   This patient is accompanied in the office by her husband  who supplements the history. Previous records as well as any outside records available were reviewed prior to todays visit.   Patient was last seen on 07/14/21    Any changes in memory since last visit? Subjectively she feels that her memory is worse especially with names," and in a conversation I  nee a little more time". I am ok at remembering faces ". She is not doing crosswords puzzles at this time, and does not participate in activities, aware of how others would perceive her.  repeats oneself?  Occasionally, not worse Disoriented when walking into a room?  Patient denies except occasionally not remembering what patient came to the room for    Leaving objects in unusual places?  Patient denies   Wandering behavior?   denies   Any personality changes since last visit?  I get frustrated when I cannot remember  Any worsening depression?: I did not change. Have not been going to [place] "because of the other people there".   Hallucinations or paranoia?  denies   Seizures?   denies    Any sleep changes?  Denies vivid dreams,  REM behavior or sleepwalking   Any hygiene concerns?   denies   Independent of bathing and dressing?  Endorsed  Does the patient needs help with medications?Patient is in charge    Who is in charge of the finances?  Patient is in charge     Any changes in appetite?  denies     Patient have trouble swallowing?  denies   Does the patient cook?  No  Any kitchen accidents such as leaving the stove on?   denies   Any headaches?    denies   Vision changes? denies Chronic back pain  denies   Ambulates with difficulty?  Uses a cane for stability Recent falls or head injuries?    denies     Unilateral weakness, numbness or tingling?   Chronic tingling, with prior EMG negative.  Any tremors?  denies   Any anosmia?    denies   Any incontinence of urine?  denies   Any bowel dysfunction?  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 Echo 2012: EF 45-50%;  b. Lex MV 2/12:  low risk, apical defect (small area of ischemia vs shifting breast atten);  c.  Echo 7/12: Normal wall thickness, EF 60-65%, normal wall motion, grade 1 diastolic dysfunction, mild LAE, PASP 32;   d. Lex MV 11/13:  EF 76%, no ischemia   OA (osteoarthritis) 09/07/2011   Obesity 09/28/2014   Osteoporosis 03/07/2011   DEXA T score -2.6 AP spine 03/07/11    Formatting of this note might be different from the original. Formatting of this note might be different from the original. DEXA T score -2.6 AP spine 03/07/11   Last Assessment & Plan:  Formatting of this note might be different from the original. Encouraged to get adequate exercise, calcium and vitamin d intake   Pain in joint, ankle and foot 09/28/2014   Palpitation 10/28/2020   Personal history of chemotherapy 2012   Personal history of radiation therapy 2012   Prediabetes 06/15/2009   Recurrent falls 12/28/2015   Right knee pain 07/10/2020   Shortness of breath    UTI (urinary tract infection) 03/03/2012   Vertebral fracture, osteoporotic, sequela 04/05/2016   Vitamin D deficiency 06/14/2017   Supplement and monitor     Past Surgical History:  Procedure Laterality Date   BREAST LUMPECTOMY  04/2009   RIGHT FOR BREAST CANCER-CHEMO/RADIATION X 1 YEAR   BREAST REDUCTION SURGERY Bilateral 05/04/2014   Procedure: MAMMARY REDUCTION  (BREAST);  Surgeon: Cristine Polio, MD;  Location: [facility];  Service: Plastics;  Laterality: Bilateral;   CARPAL TUNNEL RELEASE     L  wrist, ulnar nerve moved   CHOLECYSTECTOMY     CRANIOTOMY FOR TUMOR  2000   ELBOW SURGERY     left   KNEE ARTHROSCOPY Left 10/12/2014   Procedure: LEFT KNEE ARTHROSCOPY ;  Surgeon: Gaynelle Arabian, MD;  Location: [facility];  Service: Orthopedics;  Laterality: Left;   KYPHOPLASTY N/A 03/30/2016   Procedure: THORACIC 12 KYPHOPLASTY;  Surgeon: Phylliss Bob, MD;  Location: [facility];  Service: Orthopedics;  Laterality: N/A;  THORACIC 12 KYPHOPLASTY   LEFT HEART CATHETERIZATION WITH CORONARY ANGIOGRAM N/A 12/16/2013   Procedure: LEFT HEART CATHETERIZATION WITH CORONARY ANGIOGRAM;  Surgeon: Peter M [name], MD;  Location: [facility];  Service: Cardiovascular;  Laterality: N/A;   LIPOMA EXCISION  03/28/2009   right leg   MELANOMA EXCISION     PORT-A-CATH REMOVAL  11/30/2010   [facility]   porta cath     PORTACATH PLACEMENT  may 2011   REDUCTION MAMMAPLASTY Bilateral    SYNOVECTOMY Left 10/12/2014   Procedure: WITH SYNOVECTOMY;  Surgeon: Gaynelle Arabian, MD;  Location: [facility];  Service: Orthopedics;  Laterality: Left;   TONSILLECTOMY  1958   TOTAL HIP ARTHROPLASTY Left 11/21/2021   Procedure: TOTAL HIP ARTHROPLASTY ANTERIOR APPROACH;  Surgeon: Rod Can, MD;  Location: [facility];  Service: Orthopedics;  Laterality: Left;   TOTAL KNEE ARTHROPLASTY  02/05/2012   Procedure: TOTAL KNEE ARTHROPLASTY;  Surgeon: Gearlean Alf, MD;  Location: [facility];  Service: Orthopedics;  Laterality: Right;   TOTAL KNEE ARTHROPLASTY Left 02/10/2013   Procedure: LEFT TOTAL KNEE ARTHROPLASTY;  Surgeon: Gearlean Alf, MD;  Location: [facility];  Service: Orthopedics;  Laterality: Left;   total knee raplacement  01-2012   Right Knee   TOTAL KNEE REVISION Left 07/14/2020   Procedure: TOTAL KNEE REVISION;  Surgeon: Gaynelle Arabian, MD;  Location: [facility];  Service: Orthopedics;  Laterality: Left;   TUBAL LIGATION  1997     PREVIOUS MEDICATIONS:   CURRENT MEDICATIONS:  Outpatient Encounter Medications as of 02/10/2022  Medication Sig    acetaminophen (TYLENOL) 325 MG tablet Take 2 tablets (650 mg total) by mouth every 6 (six) hours as needed for mild pain or moderate pain (pain score 1-3 or temp > 100.5).   albuterol (VENTOLIN HFA) 108 (90 Base) MCG/ACT inhaler INHALE 1-2 PUFFS INTO THE LUNGS EVERY 6 (SIX) HOURS AS NEEDED FOR WHEEZING OR SHORTNESS OF BREATH. (Patient taking differently: Inhale 2 puffs into the lungs every 6 (six) hours as needed for wheezing or shortness of breath.)   apixaban (ELIQUIS) 5 MG TABS tablet Take 1 tablet (5 mg total) by mouth 2 (two) times daily. (Patient not taking: Reported on 02/10/2022)   Calcium Carbonate (CALCIUM 600 PO) Take 1 tablet by mouth daily.   carvedilol (COREG) 25 MG tablet TAKE 1 TABLET TWICE DAILY WITH A MEAL (Patient taking differently: Take 25 mg by mouth 2 (two) times daily with a meal.)   cetirizine (ZYRTEC) 10 MG tablet Take 10 mg by mouth at bedtime.   cholecalciferol (VITAMIN D3) 25 MCG (1000 UNIT) tablet Take 1,000 Units by mouth daily.   diltiazem (CARDIZEM CD) 120 MG 24 hr capsule TAKE 1 CAPSULE EVERY DAY   docusate sodium (COLACE) 100 MG capsule Take 1 capsule (100 mg total) by mouth 2 (two) times daily.   donepezil (ARICEPT) 10 MG tablet TAKE 1 TABLET AT BEDTIME (Patient taking differently: Take 10 mg by mouth at bedtime.)   famotidine (PEPCID) 20 MG tablet TAKE 1 TABLET TWICE DAILY   ferrous sulfate 325 (65 FE) MG tablet Take 325 mg by mouth daily with breakfast.   folic acid (FOLVITE) 1 MG tablet Take 1 tablet (1 mg total) by mouth daily.   gabapentin (NEURONTIN) 300 MG capsule Take 1 capsule (300 mg total) by mouth 2 (two) times daily.   HYDROcodone-acetaminophen (NORCO) 10-325 MG tablet Take 0.5 tablets by mouth every 4 (four) hours as needed for moderate pain or severe pain.   hydrOXYzine (ATARAX) 25 MG tablet TAKE 0.5-1 TABLETS (12.5-25 MG TOTAL) BY MOUTH 2 (TWO) TIMES DAILY AS NEEDED. (Patient taking differently: Take 25 mg by mouth 2 (two) times daily as needed  (allergies).)   Multiple Vitamin (  MULTIVITAMIN WITH MINERALS) TABS tablet Take 1 tablet by mouth daily.   omeprazole (PRILOSEC) 20 MG capsule Take 20 mg by mouth at bedtime.   pentosan polysulfate (ELMIRON) 100 MG capsule Take 200 mg by mouth in the morning and at bedtime.   polyethylene glycol (MIRALAX / GLYCOLAX) 17 g packet Take 17 g by mouth daily as needed for mild constipation.   rosuvastatin (CRESTOR) 20 MG tablet TAKE 1 TABLET AT BEDTIME (Patient taking differently: Take 20 mg by mouth at bedtime.)   traMADol (ULTRAM) 50 MG tablet Take 1 tablet (50 mg total) by mouth every 6 (six) hours as needed for moderate pain or severe pain.   venlafaxine (EFFEXOR) 75 MG tablet Take 225 mg by mouth in the morning.   No facility-administered encounter medications on file as of 02/10/2022.     Objective:     PHYSICAL EXAMINATION:    VITALS:   Vitals:   02/10/22 1507  BP: (!) 155/82  Pulse: 70  SpO2: 97%  Weight: 163 lb (73.9 kg)  Height: '5\' 4"'$  (1.626 m)    GEN:  The patient appears stated age and is in NAD. HEENT:  Normocephalic, atraumatic.   Neurological examination:  General: NAD, well-groomed, appears stated age. Orientation: The patient is alert. Oriented to person, place and date Cranial nerves: There is good facial symmetry. The speech is fluent and clear. No aphasia or dysarthria. Fund of knowledge is appropriate. Recent memory and remote memory is normal.  Attention and concentration are normal.  Able to name objects and repeat phrases.  Hearing is intact to conversational tone.   Delayed recall 3/3 Sensation: Sensation is intact to light touch throughout Motor: Strength is at least antigravity x4. Tremors: none  DTR's 2/4 in UE/LE       No data to display             02/10/2022    8:00 PM 11/28/2021    1:38 PM  MMSE - Mini Mental State Exam  Orientation to time 4 5  Orientation to Place 5 5  Registration 3 3  Attention/ Calculation 5 5  Recall 3 3  Language-  name 2 objects 2 2  Language- repeat 1 1  Language- follow 3 step command 3 3  Language- read & follow direction 1 1  Write a sentence 1 1  Copy design 1 1  Total score 29 30       Movement examination: Tone: There is normal tone in the UE/LE Abnormal movements:  no tremor.  No myoclonus.  No asterixis.   Coordination:  There is no decremation with RAM's. Normal finger to nose  Gait and Station: The patient has no difficulty arising out of a deep-seated chair without the use of the hands. The patient's stride length is good.  Gait is cautious and narrow.   Thank you for allowing [place] the opportunity to participate in the care of this nice patient. Please do not hesitate to contact us for any questions or concerns.   Total time spent on today's visit was 20 minutes dedicated to this patient today, preparing to see patient, examining the patient, ordering tests and/or medications and counseling the patient, documenting clinical information in the EHR or other health record, independently interpreting results and communicating results to the patient/family, discussing treatment and goals, answering patient's questions and coordinating care.  Cc:  Mosie Lukes, MD  Sharene Butters 02/10/2022 9:09 PM

## 2022-02-15 ENCOUNTER — Ambulatory Visit (INDEPENDENT_AMBULATORY_CARE_PROVIDER_SITE_OTHER): Payer: Medicare Other

## 2022-02-15 VITALS — Wt 152.0 lb

## 2022-02-15 DIAGNOSIS — Z0000 Encounter for general adult medical examination without abnormal findings: Secondary | ICD-10-CM | POA: Diagnosis not present

## 2022-02-15 NOTE — Progress Notes (Signed)
I connected with  Shannon [name] on 02/15/22 by a audio enabled telemedicine application and verified that I am speaking with the correct person using two identifiers.  Patient Location: Home  Provider Location: Home Office  I discussed the limitations of evaluation and management by telemedicine. The patient expressed understanding and agreed to proceed.  Subjective:   Shannon [name] is a 77 y.o. female who presents for Medicare Annual (Subsequent) preventive examination.  Review of Systems     Cardiac Risk Factors include: advanced age (>15mn, >>17women);hypertension;dyslipidemia     Objective:    Today's Vitals   02/15/22 1017  Weight: 152 lb (68.9 kg)   Body mass index is 26.09 kg/m.     02/15/2022   10:29 AM 02/10/2022    3:08 PM 11/30/2021    3:00 AM 11/29/2021    3:29 PM 11/21/2021    2:27 PM 11/21/2021    8:59 AM 07/14/2021    2:32 PM  Advanced Directives  Does Patient Have a Medical Advance Directive? Yes Yes  Yes Yes Yes Yes  Type of [place] will  Healthcare Power of [place]  Does patient want to make changes to medical advance directive?   No - Patient declined  No - Patient declined    Copy of HRoxobelin Chart? No - copy requested    No - copy requested      Current Medications (verified) Outpatient Encounter Medications as of 02/15/2022  Medication Sig   acetaminophen (TYLENOL) 325 MG tablet Take 2 tablets (650 mg total) by mouth every 6 (six) hours as needed for mild pain or moderate pain (pain score 1-3 or temp > 100.5).   albuterol (VENTOLIN HFA) 108 (90 Base) MCG/ACT inhaler INHALE 1-2 PUFFS INTO THE LUNGS EVERY 6 (SIX) HOURS AS NEEDED FOR WHEEZING OR SHORTNESS OF BREATH. (Patient taking differently: Inhale 2 puffs into the lungs every 6 (six) hours as needed for wheezing or shortness  of breath.)   apixaban (ELIQUIS) 5 MG TABS tablet Take 1 tablet (5 mg total) by mouth 2 (two) times daily. (Patient not taking: Reported on 02/10/2022)   Calcium Carbonate (CALCIUM 600 PO) Take 1 tablet by mouth daily.   carvedilol (COREG) 25 MG tablet TAKE 1 TABLET TWICE DAILY WITH A MEAL (Patient taking differently: Take 25 mg by mouth 2 (two) times daily with a meal.)   cetirizine (ZYRTEC) 10 MG tablet Take 10 mg by mouth at bedtime.   cholecalciferol (VITAMIN D3) 25 MCG (1000 UNIT) tablet Take 1,000 Units by mouth daily.   diltiazem (CARDIZEM CD) 120 MG 24 hr capsule TAKE 1 CAPSULE EVERY DAY   docusate sodium (COLACE) 100 MG capsule Take 1 capsule (100 mg total) by mouth 2 (two) times daily.   donepezil (ARICEPT) 10 MG tablet TAKE 1 TABLET AT BEDTIME (Patient taking differently: Take 10 mg by mouth at bedtime.)   famotidine (PEPCID) 20 MG tablet TAKE 1 TABLET TWICE DAILY   ferrous sulfate 325 (65 FE) MG tablet Take 325 mg by mouth daily with breakfast.   folic acid (FOLVITE) 1 MG tablet Take 1 tablet (1 mg total) by mouth daily.   gabapentin (NEURONTIN) 300 MG capsule Take 1 capsule (300 mg total) by mouth 2 (two) times daily.   HYDROcodone-acetaminophen (NORCO) 10-325 MG tablet Take 0.5 tablets by mouth every 4 (four) hours as needed for moderate  pain or severe pain.   hydrOXYzine (ATARAX) 25 MG tablet TAKE 0.5-1 TABLETS (12.5-25 MG TOTAL) BY MOUTH 2 (TWO) TIMES DAILY AS NEEDED. (Patient taking differently: Take 25 mg by mouth 2 (two) times daily as needed (allergies).)   Multiple Vitamin (MULTIVITAMIN WITH MINERALS) TABS tablet Take 1 tablet by mouth daily.   omeprazole (PRILOSEC) 20 MG capsule Take 20 mg by mouth at bedtime.   pentosan polysulfate (ELMIRON) 100 MG capsule Take 200 mg by mouth in the morning and at bedtime.   polyethylene glycol (MIRALAX / GLYCOLAX) 17 g packet Take 17 g by mouth daily as needed for mild constipation.   rosuvastatin (CRESTOR) 20 MG tablet TAKE 1 TABLET AT  BEDTIME (Patient taking differently: Take 20 mg by mouth at bedtime.)   traMADol (ULTRAM) 50 MG tablet Take 1 tablet (50 mg total) by mouth every 6 (six) hours as needed for moderate pain or severe pain.   venlafaxine (EFFEXOR) 75 MG tablet Take 225 mg by mouth in the morning.   No facility-administered encounter medications on file as of 02/15/2022.    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 Echo 2012: EF 45-50%;  b. Lex MV 2/12:  low risk, apical defect (small area of ischemia vs  shifting breast atten);  c.  Echo 7/12: Normal wall thickness, EF 60-65%, normal wall motion, grade 1 diastolic dysfunction, mild LAE, PASP 32;   d. Lex MV 11/13:  EF 76%, no ischemia   OA (osteoarthritis) 09/07/2011   Obesity 09/28/2014   Osteoporosis 03/07/2011   DEXA T score -2.6 AP spine 03/07/11    Formatting of this note might be different from the original. Formatting of this note might be different from the original. DEXA T score -2.6 AP spine 03/07/11   Last Assessment & Plan:  Formatting of this note might be different from the original. Encouraged to get adequate exercise, calcium and vitamin d intake   Pain in joint, ankle and foot 09/28/2014   Palpitation  10/28/2020   Personal history of chemotherapy 2012   Personal history of radiation therapy 2012   Prediabetes 06/15/2009   Recurrent falls 12/28/2015   Right knee pain 07/10/2020   Shortness of breath    UTI (urinary tract infection) 03/03/2012   Vertebral fracture, osteoporotic, sequela 04/05/2016   Vitamin D deficiency 06/14/2017   Supplement and monitor   Past Surgical History:  Procedure Laterality Date   BREAST LUMPECTOMY  04/2009   RIGHT FOR BREAST CANCER-CHEMO/RADIATION X 1 YEAR   BREAST REDUCTION SURGERY Bilateral 05/04/2014   Procedure: MAMMARY REDUCTION  (BREAST);  Surgeon: Cristine Polio, MD;  Location: [place];  Service: Plastics;  Laterality: Bilateral;   CARPAL TUNNEL RELEASE     L wrist, ulnar nerve moved   CHOLECYSTECTOMY     CRANIOTOMY FOR TUMOR  2000   ELBOW SURGERY     left   KNEE ARTHROSCOPY Left 10/12/2014   Procedure: LEFT KNEE ARTHROSCOPY ;  Surgeon: Gaynelle Arabian, MD;  Location: [facility];  Service: Orthopedics;  Laterality: Left;   KYPHOPLASTY N/A 03/30/2016   Procedure: THORACIC 12 KYPHOPLASTY;  Surgeon: Phylliss Bob, MD;  Location: [place];  Service: Orthopedics;  Laterality: N/A;  THORACIC 12 KYPHOPLASTY   LEFT HEART CATHETERIZATION WITH CORONARY ANGIOGRAM N/A 12/16/2013   Procedure:  LEFT HEART CATHETERIZATION WITH CORONARY ANGIOGRAM;  Surgeon: Peter M [name], MD;  [facility] C[facility];  Service: Cardiovascular;  Laterality: N/A;   LIPOMA EXCISION  03/28/2009   right leg   MELANOMA EXCISION     PORT-A-CATH REMOVAL  11/30/2010   Streck   porta cath     PORTACATH PLACEMENT  may 2011   REDUCTION MAMMAPLASTY Bilateral    SYNOVECTOMY Left 10/12/2014   Procedure: WITH SYNOVECTOMY;  Surgeon: Gaynelle Arabian, MD;  Location: [facility];  Service: Orthopedics;  Laterality: Left;   TONSILLECTOMY  1958   TOTAL HIP ARTHROPLASTY Left 11/21/2021   Procedure: TOTAL HIP ARTHROPLASTY ANTERIOR APPROACH;  Surgeon: Rod Can, MD;  Location: [facility];  Service: Orthopedics;  Laterality: Left;   TOTAL KNEE ARTHROPLASTY  02/05/2012   Procedure: TOTAL KNEE ARTHROPLASTY;  Surgeon: Gearlean Alf, MD;  Location: [facility];  Service: Orthopedics;  Laterality: Right;   TOTAL KNEE ARTHROPLASTY Left 02/10/2013   Procedure: LEFT TOTAL KNEE ARTHROPLASTY;  Surgeon: Gearlean Alf, MD;  Location: [facility];  Service: Orthopedics;  Laterality: Left;   total knee raplacement  01-2012   Right Knee   TOTAL KNEE REVISION Left 07/14/2020   Procedure: TOTAL KNEE REVISION;  Surgeon: Gaynelle Arabian, MD;  Location: [facility];  Service: Orthopedics;  Laterality: Left;   TUBAL LIGATION  1997   Family History  Problem Relation Age of Onset   Breast cancer Mother        sarcoma   Lung cancer Mother    Hypertension Mother    Prostate cancer Father    Congestive Heart Failure Father    Heart attack Father    Prostate cancer Brother    Heart disease Maternal Grandfather        MI   Down syndrome Son    CVA Son    Stomach cancer Maternal Aunt    Dementia Maternal Aunt    Uterine cancer Maternal Aunt    Colon cancer Neg Hx    Social History   Socioeconomic History   Marital status: Married    Spouse name: Adiya Selmer   Number of children: 3   Years of education: 44  Highest education level: High school graduate   Occupational History   Occupation: boutique owner-retired    Employer: [employer]  Tobacco Use   Smoking status: Never    Passive exposure: Never   Smokeless tobacco: Never  Vaping Use   Vaping Use: Never used  Substance and Sexual Activity   Alcohol use: No   Drug use: No   Sexual activity: Not Currently  Other Topics Concern   Not on file  Social History Narrative   Patient is right-handed. She lives with her husband in a 4 story home. They take care of their grown son with down syndrome who has had a stroke. She drinks 1-2 glasses of tea in the evenings. She does not formally exercise.      Social Determinants of Health   Financial Resource Strain: Low Risk  (02/15/2022)   Overall Financial Resource Strain (CARDIA)    Difficulty of Paying Living Expenses: Not hard at all  Food Insecurity: No Food Insecurity (02/15/2022)   Hunger Vital Sign    Worried About Running Out of Food in the Last Year: Never true    Ran Out of Food in the Last Year: Never true  Transportation Needs: No Transportation Needs (02/15/2022)   PRAPARE - Hydrologist (Medical): No    Lack of Transportation (Non-Medical): No  Physical Activity: Inactive (02/15/2022)   Exercise Vital Sign    Days of Exercise per Week: 0 days    Minutes of Exercise per Session: 0 min  Stress: Stress Concern Present (02/15/2022)   [place]    Feeling of Stress : To some extent  Social Connections: Moderately Integrated (02/15/2022)   Social Connection and Isolation Panel [NHANES]    Frequency of Communication with Friends and Family: More than three times a week    Frequency of Social Gatherings with Friends and Family: More than three times a week    Attends Religious Services: 1 to 4 times per year    Active Member of [employer] or Organizations: No    Attends Music therapist: Never    Marital Status: Married     Tobacco Counseling Counseling given: Not Answered   Clinical Intake:  Pre-visit preparation completed: Yes  Pain : No/denies pain     BMI - recorded: 26.09 Nutritional Status: BMI 25 -29 Overweight Nutritional Risks: None  How often do you need to have someone help you when you read instructions, pamphlets, or other written materials from your doctor or pharmacy?: 1 - Never  Diabetic?no  Interpreter Needed?: No  Information entered by :: Charlott Rakes, LPN   Activities of Daily Living    02/15/2022   10:30 AM 11/30/2021    3:00 AM  In your present state of health, do you have any difficulty performing the following activities:  Hearing? 0 0  Vision? 0 0  Difficulty concentrating or making decisions? 0 0  Walking or climbing stairs? 1 1  Comment at this time   Dressing or bathing? 0 0  Doing errands, shopping? 0 0  Preparing Food and eating ? N   Using the Toilet? N   In the past six months, have you accidently leaked urine? N   Do you have problems with loss of bowel control? N   Managing your Medications? N   Managing your Finances? N   Housekeeping or managing your Housekeeping? N     Patient Care Team: Penni Homans  A, MD as PCP - General (Family Medicine) Stanford Breed, Denice Bors, MD as PCP - Cardiology (Cardiology) Domingo Pulse, MD as Consulting Physician (Urology) Calvert Cantor, MD as Consulting Physician (Ophthalmology) Stanford Breed Denice Bors, MD as Consulting Physician (Cardiology) [name], [place] (Pharmacist) Pieter Partridge, DO as Consulting Physician (Neurology) Estrella Myrtle, Nicolasa Ducking, LCSW as Social Worker (Psychology) Shawn Route, Nancy Marus (Neurology)  Indicate any recent Medical Services you may have received from other than [facility] providers in the past year (date may be approximate).     Assessment:   This is a routine wellness examination for [place].  Hearing/Vision screen Hearing Screening - Comments:: Pt denies any hearing issues  Vision  Screening - Comments:: Pt follows up with Dr Bing Plume for annual eye exams   Dietary issues and exercise activities discussed: Current Exercise Habits: The patient does not participate in regular exercise at present   Goals Addressed             This Visit's Progress    Patient Stated       To lose weight        Depression Screen    02/15/2022   10:25 AM 12/27/2021   11:58 AM 12/27/2021   11:57 AM 09/26/2021   11:55 AM 06/23/2021    8:49 AM 12/30/2020    2:16 PM 12/13/2020    1:18 PM  PHQ 2/9 Scores  PHQ - 2 Score 0 0 0 0 '2 2 2  '$ PHQ- 9 Score 0 0  0 '5 4 4    '$ Fall Risk    02/15/2022   10:29 AM 02/10/2022    3:08 PM 12/27/2021   11:57 AM 09/26/2021   11:55 AM 07/14/2021    2:32 PM  Fall Risk   Falls in the past year? 1 0 1 0 0  Number falls in past yr: 1 0 0 0 0  Injury with Fall? 1 0 1 0 0  Comment broke left hip from falling down stairs      Risk for fall due to : Impaired balance/gait;Impaired vision      Follow up Falls prevention discussed Falls evaluation completed Falls evaluation completed      FALL RISK PREVENTION PERTAINING TO THE HOME:  Any stairs in or around the home? Yes  If so, are there any without handrails? No  Home free of loose throw rugs in walkways, pet beds, electrical cords, etc? Yes  Adequate lighting in your home to reduce risk of falls? Yes   ASSISTIVE DEVICES UTILIZED TO PREVENT FALLS:  Life alert? No  Use of a cane, walker or w/c? Yes  Grab bars in the bathroom? Yes  Shower chair or bench in shower? Yes  Elevated toilet seat or a handicapped toilet? Yes   TIMED UP AND GO:  Was the test performed? No .  Cognitive Function:    02/10/2022    8:00 PM 11/28/2021    1:38 PM  MMSE - Mini Mental State Exam  Orientation to time 4 5  Orientation to Place 5 5  Registration 3 3  Attention/ Calculation 5 5  Recall 3 3  Language- name 2 objects 2 2  Language- repeat 1 1  Language- follow 3 step command 3 3  Language- read & follow  direction 1 1  Write a sentence 1 1  Copy design 1 1  Total score 29 30        02/15/2022   10:31 AM  6CIT Screen  What Year? 0  points  What month? 0 points  What time? 0 points  Count back from 20 0 points  Months in reverse 0 points  Repeat phrase 0 points  Total Score 0 points    Immunizations Immunization History  Administered Date(s) Administered   Fluad Quad(high Dose 65+) 10/14/2018, 12/02/2020   Influenza Split 11/11/2010, 11/14/2011   Influenza Whole 02/10/2009   Influenza, High Dose Seasonal PF 11/04/2015, 10/16/2017, 11/20/2019   Influenza,inj,Quad PF,6+ Mos 10/18/2012, 10/10/2013, 12/21/2014   Influenza-Unspecified 08/30/2016   Moderna Sars-Covid-2 Vaccination 03/30/2019, 04/29/2019   PFIZER Comirnaty(Gray Top)Covid-19 Tri-Sucrose Vaccine 07/05/2020   PFIZER(Purple Top)SARS-COV-2 Vaccination 03/30/2019, 04/29/2019, 11/20/2019   Pfizer Covid-19 Vaccine Bivalent Booster 15yr & up 10/28/2020   Pneumococcal Conjugate-13 12/21/2014   Pneumococcal Polysaccharide-23 09/04/2011   Td 01/30/2001, 12/28/2015   Td (Adult), 2 Lf Tetanus Toxid, Preservative Free 01/30/2001, 12/28/2015   Zoster Recombinat (Shingrix) 05/09/2016    TDAP status: Up to date  Flu Vaccine status: Due, Education has been provided regarding the importance of this vaccine. Advised may receive this vaccine at local pharmacy or Health Dept. Aware to provide a copy of the vaccination record if obtained from local pharmacy or Health Dept. Verbalized acceptance and understanding.  Pneumococcal vaccine status: Up to date  Covid-19 vaccine status: Completed vaccines  Qualifies for Shingles Vaccine? Yes   Zostavax completed Yes   Shingrix Completed?: No.    Education has been provided regarding the importance of this vaccine. Patient has been advised to call insurance company to determine out of pocket expense if they have not yet received this vaccine. Advised may also receive vaccine at local pharmacy  or Health Dept. Verbalized acceptance and understanding.  Screening Tests Health Maintenance  Topic Date Due   Zoster Vaccines- Shingrix (2 of 2) 07/04/2016   COVID-19 Vaccine (8 - 2023-24 season) 09/30/2021   INFLUENZA VACCINE  05/01/2022 (Originally 08/30/2021)   Medicare Annual Wellness (AWV)  02/16/2023   DTaP/Tdap/Td (5 - Tdap) 12/27/2025   Pneumonia Vaccine 77 Years old  Completed   DEXA SCAN  Completed   Hepatitis C Screening  Completed   HPV VACCINES  Aged Out   COLONOSCOPY (Pts 45-464yrInsurance coverage will need to be confirmed)  Discontinued    Health Maintenance  Health Maintenance Due  Topic Date Due   Zoster Vaccines- Shingrix (2 of 2) 07/04/2016   COVID-19 Vaccine (8 - 2023-24 season) 09/30/2021    Colorectal cancer screening: No longer required.   Mammogram status: Completed scheduled 03/28/22. Repeat every year  Bone Density status: Completed 09/23/20. Results reflect: Bone density results: OSTEOPOROSIS. Repeat every 2 years. Additional Screening:  Hepatitis C Screening Completed 12/21/14  Vision Screening: Recommended annual ophthalmology exams for early detection of glaucoma and other disorders of the eye. Is the patient up to date with their annual eye exam?  No  Who is the provider or what is the name of the office in which the patient attends annual eye exams? [facility]  If pt is not established with a provider, would they like to be referred to a provider to establish care? No .   Dental Screening: Recommended annual dental exams for proper oral hygiene  Community Resource Referral / Chronic Care Management: CRR required this visit?  No   CCM required this visit?  No      Plan:     I have personally reviewed and noted the following in the patient's chart:   Medical and social history Use of alcohol, tobacco or illicit drugs  Current medications and supplements including opioid prescriptions. Patient is currently taking opioid prescriptions.  Information provided to patient regarding non-opioid alternatives. Patient advised to discuss non-opioid treatment plan with their provider. Functional ability and status Nutritional status Physical activity Advanced directives List of other physicians Hospitalizations, surgeries, and ER visits in previous 12 months Vitals Screenings to include cognitive, depression, and falls Referrals and appointments  In addition, I have reviewed and discussed with patient certain preventive protocols, quality metrics, and best practice recommendations. A written personalized care plan for preventive services as well as general preventive health recommendations were provided to patient.     Willette Brace, LPN   [id number]   Nurse Notes: none

## 2022-02-15 NOTE — Patient Instructions (Signed)
Shannon [name] , Thank you for taking time to come for your Medicare Wellness Visit. I appreciate your ongoing commitment to your health goals. Please review the following plan we discussed and let me know if I can assist you in the future.   These are the goals we discussed:  Goals      Complete grievance counseling     Patient Stated     To lose weight      Pharmacy Care Plan for Chronic Care Management     Chronic Care Management Care Plan / Goals of Therapy:  Hypertension / Tachycardia Uncontrolled - home blood pressure improved since telmisartan added back; blood pressure goal <140/90 BP Readings from Last 3 Encounters:  10/04/21 (!) 152/69  09/26/21 128/72  07/14/21 133/78  Current treatment: Carvedilol '25mg'$  twice a day (also helps with heart rate) Diltiazem '120mg'$  daily (also helps with heart rate)  Telmisartan '40mg'$  daily  Interventions:  Discussed blood pressure goal and importance of blood pressure control in heart and kidney health.  Discussed when was best time to take medications for blood pressure  Recommend checking blood pressure 2 to 3 times per week. Record for future visits.  Hyperlipidemia: Uncontrolled but close to goal LDL goal <70 and triglycerides < 150 Lab Results  Component Value Date   CHOL 167 06/23/2021   HDL 49.90 06/23/2021   LDLCALC 80 06/05/2018   LDLDIRECT 89.0 06/23/2021   TRIG 236.0 (H) 06/23/2021   CHOLHDL 3 06/23/2021  Current treatment:  Rosuvastatin '20mg'$  daily at bedtime Current dietary patterns: not following any particular diet.  Interventions:  Discussed limiting intake of saturated and trans fat - follow heart health diet.  Continue rosuvastatin. Start regular exercise - walking or use son's home gym. Goal is to start with 10 to 30 minutes per day and work up to 150 minutes per week.   Interstitial Cystitis: Managed by [facility] - Dr Amalia Hailey and Dr Chauncey Cruel. [name]  Currently stable, but notes that cost of  Elmiron has recently increased due to being in Medicare Coverage gap Current therapy:  Elmiron '100mg'$  twice a day Hydroxyzine '25mg'$  - take 12.5 to '25mg'$  up to twice a day if needed.  Interventions:  Patient will completed tax return for 2022 and then complete patient assistance program application for Elmiron. Continue current medication and follow up with Dr Amalia Hailey.  Trial tapering hydroxyzine '25mg'$  - take 0.5 tablet once a day for 1 week, if not changes in bladder symptoms you can try stopping.  If you experience any changes in bladder symptoms - pain or discomfort, then restart hydroxyzine.    Osteoporosis:  Goal: prevent further decrease in BMD, prevent falls and prevent fractures Current therapy:  None Interventions:  Will check with PCP about possibly restarting Prolia Recommended '1200mg'$  calcium daily from food and supplementation Recommended vitamin D 1000 until daily Fall prevention discussed  Medication management Pharmacist Clinical Goal(s): Over the next 90 days, patient will work with PharmD and providers to maintain optimal medication adherence Current [facility] Interventions Comprehensive medication review performed. Reviewed refill history and assessed adherence   Continue current medication management strategy   Patient Goals/Self-Care Activities Over the next 90 days, patient will:  take medications as prescribed,  focus on medication adherence by using weekly medication boxes / reminders,  check blood pressure 2 to 3 times per week, document, and provide at future appointments, and  Complete application for Elmiron for 2023.  Continue lower  dose of famotidine '20mg'$  at bedtime.  Hold omeprazole '20mg'$  daily. Monitor increase in acid reflux / heartburn. If you experience increase in acid reflux or heartburn, then restart omeprazole Hold melatonin . Monitor for changes in sleep duration or quality. Can restart melatonin if you see any changes.   Trial tapering hydroxyzine '25mg'$  - take 0.5 tablet once a day for 1 week, if not changes in bladder symptoms you can try stopping.  If you experience any changes in bladder symptoms - pain or discomfort, then restart hydroxyzine.   Follow Up Plan: Telephone follow up appointment with care management team member scheduled for:  1 to 2 months         This is a list of the screening recommended for you and due dates:  Health Maintenance  Topic Date Due   Zoster (Shingles) Vaccine (2 of 2) 07/04/2016   COVID-19 Vaccine (8 - 2023-24 season) 09/30/2021   Flu Shot  05/01/2022*   Medicare Annual Wellness Visit  02/16/2023   DTaP/Tdap/Td vaccine (5 - Tdap) 12/27/2025   Pneumonia Vaccine  Completed   DEXA scan (bone density measurement)  Completed   Hepatitis C Screening: USPSTF Recommendation to screen - Ages 55-79 yo.  Completed   HPV Vaccine  Aged Out   Colon Cancer Screening  Discontinued  *Topic was postponed. The date shown is not the original due date.    Advanced directives: copies in chart   Conditions/risks identified: lose weight and exercise   Next appointment: Follow up in one year for your annual wellness visit    Preventive Care 65 Years and Older, Female Preventive care refers to lifestyle choices and visits with your health care provider that can promote health and wellness. What does preventive care include? A yearly physical exam. This is also called an annual well check. Dental exams once or twice a year. Routine eye exams. Ask your health care provider how often you should have your eyes checked. Personal lifestyle choices, including: Daily care of your teeth and gums. Regular physical activity. Eating a healthy diet. Avoiding tobacco and drug use. Limiting alcohol use. Practicing safe sex. Taking low-dose aspirin every day. Taking vitamin and mineral supplements as recommended by your health care provider. What happens during an annual well check? The  services and screenings done by your health care provider during your annual well check will depend on your age, overall health, lifestyle risk factors, and family history of disease. Counseling  Your health care provider may ask you questions about your: Alcohol use. Tobacco use. Drug use. Emotional well-being. Home and relationship well-being. Sexual activity. Eating habits. History of falls. Memory and ability to understand (cognition). Work and work Statistician. Reproductive health. Screening  You may have the following tests or measurements: Height, weight, and BMI. Blood pressure. Lipid and cholesterol levels. These may be checked every 5 years, or more frequently if you are over 56 years old. Skin check. Lung cancer screening. You may have this screening every year starting at age 2 if you have a 30-pack-year history of smoking and currently smoke or have quit within the past 15 years. Fecal occult blood test (FOBT) of the stool. You may have this test every year starting at age 33. Flexible sigmoidoscopy or colonoscopy. You may have a sigmoidoscopy every 5 years or a colonoscopy every 10 years starting at age 63. Hepatitis C blood test. Hepatitis B blood test. Sexually transmitted disease (STD) testing. Diabetes screening. This is done by checking your blood sugar (glucose)  after you have not eaten for a while (fasting). You may have this done every 1-3 years. Bone density scan. This is done to screen for osteoporosis. You may have this done starting at age 37. Mammogram. This may be done every 1-2 years. Talk to your health care provider about how often you should have regular mammograms. Talk with your health care provider about your test results, treatment options, and if necessary, the need for more tests. Vaccines  Your health care provider may recommend certain vaccines, such as: Influenza vaccine. This is recommended every year. Tetanus, diphtheria, and acellular  pertussis (Tdap, Td) vaccine. You may need a Td booster every 10 years. Zoster vaccine. You may need this after age 74. Pneumococcal 13-valent conjugate (PCV13) vaccine. One dose is recommended after age 45. Pneumococcal polysaccharide (PPSV23) vaccine. One dose is recommended after age 45. Talk to your health care provider about which screenings and vaccines you need and how often you need them. This information is not intended to replace advice given to you by your health care provider. Make sure you discuss any questions you have with your health care provider. Document Released: 02/12/2015 Document Revised: 10/06/2015 Document Reviewed: 11/17/2014 Elsevier Interactive Patient Education  2017 [employer] Prevention in the Home Falls can cause injuries. They can happen to people of all ages. There are many things you can do to make your home safe and to help prevent falls. What can I do on the outside of my home? Regularly fix the edges of walkways and driveways and fix any cracks. Remove anything that might make you trip as you walk through a door, such as a raised step or threshold. Trim any bushes or trees on the path to your home. Use bright outdoor lighting. Clear any walking paths of anything that might make someone trip, such as rocks or tools. Regularly check to see if handrails are loose or broken. Make sure that both sides of any steps have handrails. Any raised decks and porches should have guardrails on the edges. Have any leaves, snow, or ice cleared regularly. Use sand or salt on walking paths during winter. Clean up any spills in your garage right away. This includes oil or grease spills. What can I do in the bathroom? Use night lights. Install grab bars by the toilet and in the tub and shower. Do not use towel bars as grab bars. Use non-skid mats or decals in the tub or shower. If you need to sit down in the shower, use a plastic, non-slip stool. Keep the floor  dry. Clean up any water that spills on the floor as soon as it happens. Remove soap buildup in the tub or shower regularly. Attach bath mats securely with double-sided non-slip rug tape. Do not have throw rugs and other things on the floor that can make you trip. What can I do in the bedroom? Use night lights. Make sure that you have a light by your bed that is easy to reach. Do not use any sheets or blankets that are too big for your bed. They should not hang down onto the floor. Have a firm chair that has side arms. You can use this for support while you get dressed. Do not have throw rugs and other things on the floor that can make you trip. What can I do in the kitchen? Clean up any spills right away. Avoid walking on wet floors. Keep items that you use a lot in easy-to-reach places. If  you need to reach something above you, use a strong step stool that has a grab bar. Keep electrical cords out of the way. Do not use floor polish or wax that makes floors slippery. If you must use wax, use non-skid floor wax. Do not have throw rugs and other things on the floor that can make you trip. What can I do with my stairs? Do not leave any items on the stairs. Make sure that there are handrails on both sides of the stairs and use them. Fix handrails that are broken or loose. Make sure that handrails are as long as the stairways. Check any carpeting to make sure that it is firmly attached to the stairs. Fix any carpet that is loose or worn. Avoid having throw rugs at the top or bottom of the stairs. If you do have throw rugs, attach them to the floor with carpet tape. Make sure that you have a light switch at the top of the stairs and the bottom of the stairs. If you do not have them, ask someone to add them for you. What else can I do to help prevent falls? Wear shoes that: Do not have high heels. Have rubber bottoms. Are comfortable and fit you well. Are closed at the toe. Do not wear  sandals. If you use a stepladder: Make sure that it is fully opened. Do not climb a closed stepladder. Make sure that both sides of the stepladder are locked into place. Ask someone to hold it for you, if possible. Clearly mark and make sure that you can see: Any grab bars or handrails. First and last steps. Where the edge of each step is. Use tools that help you move around (mobility aids) if they are needed. These include: Canes. Walkers. Scooters. Crutches. Turn on the lights when you go into a dark area. Replace any light bulbs as soon as they burn out. Set up your furniture so you have a clear path. Avoid moving your furniture around. If any of your floors are uneven, fix them. If there are any pets around you, be aware of where they are. Review your medicines with your doctor. Some medicines can make you feel dizzy. This can increase your chance of falling. Ask your doctor what other things that you can do to help prevent falls. This information is not intended to replace advice given to you by your health care provider. Make sure you discuss any questions you have with your health care provider. Document Released: 11/12/2008 Document Revised: 06/24/2015 Document Reviewed: 02/20/2014 Elsevier Interactive Patient Education  2017 [employer].

## 2022-02-16 ENCOUNTER — Ambulatory Visit: Payer: Medicare Other | Admitting: Pharmacist

## 2022-02-16 DIAGNOSIS — I10 Essential (primary) hypertension: Secondary | ICD-10-CM

## 2022-02-16 DIAGNOSIS — M8000XD Age-related osteoporosis with current pathological fracture, unspecified site, subsequent encounter for fracture with routine healing: Secondary | ICD-10-CM

## 2022-02-16 DIAGNOSIS — F0670 Mild neurocognitive disorder due to known physiological condition without behavioral disturbance: Secondary | ICD-10-CM

## 2022-02-16 DIAGNOSIS — I2699 Other pulmonary embolism without acute cor pulmonale: Secondary | ICD-10-CM

## 2022-02-16 DIAGNOSIS — F0390 Unspecified dementia without behavioral disturbance: Secondary | ICD-10-CM

## 2022-02-16 NOTE — Progress Notes (Signed)
Pharmacy Note  02/16/2022 Name: Shannon [name] MRN: [id number] DOB: 1946/01/02  Subjective: Shannon [name] is a 77 y.o. year old female who is a primary care patient of Mosie Lukes, MD. Clinical Pharmacist Practitioner referral was placed to assist with medication management.    Engaged with patient by telephone for follow up visit today.  Since our last visit patient had a fall and fractured her left hip. She was discharged to rehab facility. Three days after discharge she was readmitted for PE. She is currently taking Eliqius '5mg'$  twice a day (Started 11/29/2021).   Osteoporosis - Last DEXA 09/23/2020 noted osteoporosis at right neck of hip. (See DEXA results below). Patient has taken alendronate from 02/16/2012 to 02/27/2016.  Patient also took Prolia in past but last dose was 05/2016. Patient states that she is using cane or walker to ambulate. She was only at [facility] for rehab of 3 or 4 days per patient. She would like to have outpatient physical therapy in [facility] is possible to help build strength and improve balance.    GERD - Taking Omeprazole '20mg'$  daily and famotidine '20mg'$  at bedtime. We tried to lower dose / taper off omeprazole in 2023 but patient decided not to lower dose of acid reducing medication at that time.  Insomnia - patient reports she has been sleeping well recently. She previously was taking Melatonin but stopped about 4 months ago.  Patient reports she has completed application for [employer] and will take to urologist soon. Hypertension - patient's last blood pressure at neurologist office was elevated.  Shannon [name] is taking diltiazem '300mg'$  daily for blood pressure and HR. She was previously taking telmisartan '40mg'$  daily but this was stopped during hospitalization in October. Patient states her daughter is a Marine scientist and checks blood pressure but Shannon [name] did not have any readings to report. She states blood pressure has been pretty good - usually 130's to 140's /  70's.  Alzheimer's Disease - Last visit with neurology was 02/10/2022. Taking donepezil daily. NP at neurology office recommended patient see psychiatrist due to anxiety / depression. Patient is taking venlafaxine 225 daily. Patient declined psychiatry appointment but she is agreeable to continue to see counselor. Patient will be due for repeat neuropsychiatry testing around May to November 2024. (Last was 11/2020)    DEXA results AP Spine L3-L4 09/23/2020 Normal -0.8  AP Spine L3-L4 01/10/2018 Normal -0.6    DualFemur Neck Right 09/23/2020 Osteoporosis -3.0  DualFemur Neck Right 01/10/2018 Osteopenia -2.4    DualFemur Total Mean 09/23/2020 Osteopenia -2.0  DualFemur Total Mean 01/10/2018 Osteopenia -1.5    Left Forearm Radius 33% 09/23/2020 Osteopenia -1.8  Left Forearm Radius 33% 01/10/2018 Osteopenia -1.4    Recent Hospitalizations:  11/29/2021 to 12/01/2021 - Hospitalization for acute PE. Presented from [facility]. Negative for DVT but CTA chest showed lower right lobe PE.  Medications started at discharge - Eliquis '10mg'$  bid for 1 week, then '5mg'$  twice a day.  Medications stopped at discharge - aspirin  11/21/2021 to 11/26/2021 [facility] Admission For fall with left hip fracture. Discharged to SNF.  Meds stopped at discharge - naproxen '220mg'$  and telmisartan '40mg'$ .  Medications started at discharge -  acetaminophen (TYLENOL) aspirin '81mg'$ ;  Hydrocodone-acetaminophen (Norco) nystatin (MYCOSTATIN/NYSTOP) polyethylene glycol (MIRALAX) Medications CHANGED at discharge: docusate sodium (COLACE)  Objective: Review of patient status, including review of consultants reports, laboratory and other test data, was performed as part of comprehensive evaluation and provision of  chronic care management services.   Lab Results  Component Value Date   CREATININE 0.79 12/27/2021   CREATININE 0.70 12/01/2021   CREATININE 0.94 11/29/2021    Lab Results  Component Value Date    HGBA1C 4.9 12/27/2021       Component Value Date/Time   CHOL 161 12/27/2021 1251   TRIG 147.0 12/27/2021 1251   HDL 56.50 12/27/2021 1251   CHOLHDL 3 12/27/2021 1251   VLDL 29.4 12/27/2021 1251   LDLCALC 75 12/27/2021 1251   LDLDIRECT 89.0 06/23/2021 0924     Clinical ASCVD: Yes  The 10-year ASCVD risk score (Arnett DK, et al., 2019) is: 35.4%   Values used to calculate the score:     Age: 55 years     Sex: Female     Is Non-Hispanic African American: No     Diabetic: No     Tobacco smoker: No     Systolic Blood Pressure: 161 mmHg     Is BP treated: Yes     HDL Cholesterol: 56.5 mg/dL     Total Cholesterol: 161 mg/dL    BP Readings from Last 3 Encounters:  02/10/22 (!) 155/82  12/27/21 130/70  12/01/21 132/71     Allergies  Allergen Reactions   Dilaudid [Hydromorphone Hcl] Itching and Other (See Comments)    "Went Crazy"     Medications Reviewed Today     Reviewed by Willette Brace, LPN (Licensed Practical Nurse) on 02/15/22 at 24  Med List Status: <None>   Medication Order Taking? Sig Documenting Provider Last Dose Status Informant  acetaminophen (TYLENOL) 325 MG tablet [id number] Yes Take 2 tablets (650 mg total) by mouth every 6 (six) hours as needed for mild pain or moderate pain (pain score 1-3 or temp > 100.5). Eugenie Filler, MD Taking Active Nursing Home Medication Administration Guide (MAG)  albuterol (VENTOLIN HFA) 108 (90 Base) MCG/ACT inhaler [id number] Yes INHALE 1-2 PUFFS INTO THE LUNGS EVERY 6 (SIX) HOURS AS NEEDED FOR WHEEZING OR SHORTNESS OF BREATH.  Patient taking differently: Inhale 2 puffs into the lungs every 6 (six) hours as needed for wheezing or shortness of breath.   Mosie Lukes, MD Taking Active Nursing Home Medication Administration Guide (MAG)  apixaban (ELIQUIS) 5 MG TABS tablet [id number] No Take 1 tablet (5 mg total) by mouth 2 (two) times daily.  Patient not taking: Reported on 02/10/2022   Mosie Lukes, MD Not Taking  Active   Calcium Carbonate (CALCIUM 600 PO) [id number] Yes Take 1 tablet by mouth daily. [provider] Taking Active Nursing Home Medication Administration Guide (MAG)  carvedilol (COREG) 25 MG tablet [id number] Yes TAKE 1 TABLET TWICE DAILY WITH A MEAL  Patient taking differently: Take 25 mg by mouth 2 (two) times daily with a meal.   Stanford Breed, Denice Bors, MD Taking Active Nursing Home Medication Administration Guide (MAG)  cetirizine (ZYRTEC) 10 MG tablet [id number] Yes Take 10 mg by mouth at bedtime. [provider] Taking Active Nursing Home Medication Administration Guide (MAG)  cholecalciferol (VITAMIN D3) 25 MCG (1000 UNIT) tablet [id number] Yes Take 1,000 Units by mouth daily. [provider] Taking Active Nursing Home Medication Administration Guide (MAG)  diltiazem (CARDIZEM CD) 120 MG 24 hr capsule [id number] Yes TAKE 1 CAPSULE EVERY DAY Crenshaw, Denice Bors, MD Taking Active   docusate sodium (COLACE) 100 MG capsule [id number] Yes Take 1 capsule (100 mg total) by mouth 2 (two) times daily. Eugenie Filler, MD Taking Active Nursing Home  Medication Administration Guide (MAG)  donepezil (ARICEPT) 10 MG tablet [id number] Yes TAKE 1 TABLET AT BEDTIME  Patient taking differently: Take 10 mg by mouth at bedtime.   Rondel Jumbo, PA-C Taking Active Nursing Home Medication Administration Guide (MAG)  famotidine (PEPCID) 20 MG tablet [id number] Yes TAKE 1 TABLET TWICE DAILY Mosie Lukes, MD Taking Active Nursing Home Medication Administration Guide (MAG)  ferrous sulfate 325 (65 FE) MG tablet [id number] Yes Take 325 mg by mouth daily with breakfast. [provider] Taking Active Nursing Home Medication Administration Guide (MAG)  folic acid (FOLVITE) 1 MG tablet [id number] Yes Take 1 tablet (1 mg total) by mouth daily. Mosie Lukes, MD Taking Active Nursing Home Medication Administration Guide (MAG)  gabapentin (NEURONTIN) 300 MG capsule [id number] Yes Take 1 capsule  (300 mg total) by mouth 2 (two) times daily. Pieter Partridge, DO Taking Active Nursing Home Medication Administration Guide (MAG)  HYDROcodone-acetaminophen [facility]) 10-325 MG tablet [id number] Yes Take 0.5 tablets by mouth every 4 (four) hours as needed for moderate pain or severe pain. Rai, Vernelle Emerald, MD Taking Active   hydrOXYzine (ATARAX) 25 MG tablet [id number] Yes TAKE 0.5-1 TABLETS (12.5-25 MG TOTAL) BY MOUTH 2 (TWO) TIMES DAILY AS NEEDED.  Patient taking differently: Take 25 mg by mouth 2 (two) times daily as needed (allergies).   Mosie Lukes, MD Taking Active Nursing Home Medication Administration Guide (MAG)  Multiple Vitamin (MULTIVITAMIN WITH MINERALS) TABS tablet [id number] Yes Take 1 tablet by mouth daily. [provider] Taking Active Nursing Home Medication Administration Guide (MAG)  omeprazole (PRILOSEC) 20 MG capsule [id number] Yes Take 20 mg by mouth at bedtime. [provider] Taking Active Nursing Home Medication Administration Guide (MAG)  pentosan polysulfate (ELMIRON) 100 MG capsule [id number] Yes Take 200 mg by mouth in the morning and at bedtime. Perkins, Alexzandrew L, PA-C Taking Active Nursing Home Medication Administration Guide (MAG)  polyethylene glycol (MIRALAX / GLYCOLAX) 17 g packet [id number] Yes Take 17 g by mouth daily as needed for mild constipation. Eugenie Filler, MD Taking Active Nursing Home Medication Administration Guide (MAG)  rosuvastatin (CRESTOR) 20 MG tablet [id number] Yes TAKE 1 TABLET AT BEDTIME  Patient taking differently: Take 20 mg by mouth at bedtime.   Lelon Perla, MD Taking Active Nursing Home Medication Administration Guide (MAG)  traMADol (ULTRAM) 50 MG tablet [id number] Yes Take 1 tablet (50 mg total) by mouth every 6 (six) hours as needed for moderate pain or severe pain. Rai, Vernelle Emerald, MD Taking Active   venlafaxine [facility]) 75 MG tablet [id number] Yes Take 225 mg by mouth in the morning. [provider]  Taking Active Nursing Home Medication Administration Guide (MAG)  Med List Note Valere Dross 11/30/21 0023): [facility] [phone]            Patient Active Problem List   Diagnosis Date Noted   Pulmonary embolism ([place]) 11/29/2021   Chronic diastolic CHF (congestive heart failure) ([place]) 11/23/2021   Closed left hip fracture, initial encounter ([place]) 11/21/2021   Grade I diastolic dysfunction 26/94/8546   Fall 11/21/2021   Dementia ([place]) 03/02/2021   Sleep apnea 01/12/2021   Depression with anxiety 01/12/2021   Mild neurocognitive disorder due to Alzheimer's disease 12/01/2020   History of melanoma 11/17/2020   Palpitation 10/28/2020   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   Right knee pain 07/10/2020   Urinary frequency 02/11/2020   Debility 12/23/2017   Left knee pain 12/23/2017  Vitamin D deficiency 06/14/2017   Vertebral fracture, osteoporotic 04/05/2016   Left-sided back pain 03/06/2016   Chest pain 02/27/2016   Syncope 02/27/2016   Cataracts, bilateral 12/28/2015   Recurrent falls 12/28/2015   Family history- stomach cancer 09/08/2015   Family history of cancer 09/08/2015   [name] lesion 06/21/2015   Left leg swelling 05/24/2015   Obesity 09/28/2014   Interstitial cystitis 09/28/2014   Pain in joint, ankle and foot 09/28/2014   Abnormal nuclear stress test 12/16/2013   Sternal pain 11/06/2013   Postoperative anemia due to acute blood loss 02/13/2013   Congestive dilated cardiomyopathy ([place]) 12/30/2012   Breast cancer of lower-inner quadrant of right female breast 10/21/2012   Constipation 03/03/2012   OA (osteoarthritis) 09/07/2011   Dermatitis    Hyponatremia 08/28/2011   Osteoporosis 03/07/2011   Prediabetes 06/15/2009   Lipoma 02/10/2009   Dyspnea on exertion 02/10/2009   Mixed hyperlipidemia 08/11/2008   Major depressive disorder 08/11/2008   Essential hypertension 08/11/2008   GERD without esophagitis  08/11/2008   Craniopharyngioma 2000     Medication Assistance:   [employer] patient assistance program application has been provided to patient in 2023. She has completed and will take to her urologist office for them to complete provider portion and fax.    Assessment / Plan: Reviewed medications with patient. She will continue to work with her daughter who fills weekly pill containers for her.  Gave plan to taper omeprazole if she decides she would like to taper - omeprazole '20mg'$  every other day for 2 weeks, then 3 times per week for 2 weeks, then stops.  Continue famotidine '20mg'$  twice a day which I assured her was OK.  Recommended consider restarting either Prolia or bisphosphonate. Will discuss osteoporosis therapy and physical therapy referral with PCP.  Continue Eliquis '5mg'$  twice a day for 6 months post PE (thru April 2024)a Will verify home blood pressure readings with patient's daughter - may need consider either increasing diltiazem or add additional blood pressure lowering medications.   Follow Up:  Telephone follow up appointment with care management team member scheduled for:  3 months.   [name], PharmD Clinical Pharmacist [facility] [place] [facility] [phone]

## 2022-02-16 NOTE — Patient Instructions (Addendum)
    Prilosec / omeprazole - if you would like to try to stop this medicaiton (it is for heartburn / acid reflux) follow the recommendation below:  Take 1 tablet every OTHER day for 2 weeks.  Then try to decreased to take 1 tablet 2 time per WEEK for 2 weeks.  Then stop omeprazole

## 2022-02-19 ENCOUNTER — Other Ambulatory Visit: Payer: Self-pay | Admitting: Family Medicine

## 2022-02-19 DIAGNOSIS — M25559 Pain in unspecified hip: Secondary | ICD-10-CM

## 2022-02-19 DIAGNOSIS — M25562 Pain in left knee: Secondary | ICD-10-CM

## 2022-02-20 ENCOUNTER — Telehealth: Payer: Self-pay

## 2022-02-20 ENCOUNTER — Other Ambulatory Visit: Payer: Self-pay

## 2022-02-20 MED ORDER — ALENDRONATE SODIUM 70 MG PO TABS: 70.0000 mg | ORAL_TABLET | ORAL | 3 refills | Status: DC

## 2022-02-20 NOTE — Telephone Encounter (Signed)
Called pt was advised appt made VV And medication sent

## 2022-02-21 NOTE — Progress Notes (Signed)
Addendum: Patient called about starting alendronate - reminded that she will take it alendronate once a week with a full glass of water. Reminded to no lie down for 30 minutes after taking alendronate.

## 2022-02-26 NOTE — Assessment & Plan Note (Signed)
Tolerating Eliquis

## 2022-02-26 NOTE — Assessment & Plan Note (Signed)
Following with neurology and tolerating Aricept

## 2022-02-26 NOTE — Assessment & Plan Note (Signed)
Tolerating Fosamax  

## 2022-02-26 NOTE — Assessment & Plan Note (Signed)
Has been referred to behavioral health and starts with them soon. On Venlafaxine

## 2022-02-26 NOTE — Assessment & Plan Note (Signed)
[id number] acceptable, minimize simple carbs. Increase exercise as tolerated.  

## 2022-02-26 NOTE — Assessment & Plan Note (Signed)
Supplement and monitor 

## 2022-02-27 ENCOUNTER — Telehealth (INDEPENDENT_AMBULATORY_CARE_PROVIDER_SITE_OTHER): Payer: Medicare Other | Admitting: Family Medicine

## 2022-02-27 DIAGNOSIS — M810 Age-related osteoporosis without current pathological fracture: Secondary | ICD-10-CM

## 2022-02-27 DIAGNOSIS — F0390 Unspecified dementia without behavioral disturbance: Secondary | ICD-10-CM

## 2022-02-27 DIAGNOSIS — F418 Other specified anxiety disorders: Secondary | ICD-10-CM | POA: Diagnosis not present

## 2022-02-27 DIAGNOSIS — E559 Vitamin D deficiency, unspecified: Secondary | ICD-10-CM | POA: Diagnosis not present

## 2022-02-27 DIAGNOSIS — I5032 Chronic diastolic (congestive) heart failure: Secondary | ICD-10-CM | POA: Diagnosis not present

## 2022-02-27 DIAGNOSIS — R7303 Prediabetes: Secondary | ICD-10-CM

## 2022-02-27 DIAGNOSIS — I2699 Other pulmonary embolism without acute cor pulmonale: Secondary | ICD-10-CM

## 2022-03-05 NOTE — Progress Notes (Signed)
MyChart Video Visit    Virtual Visit via Video Note   This visit type was conducted due to national recommendations for restrictions regarding the COVID-19 Pandemic (e.g. social distancing) in an effort to limit this patient's exposure and mitigate transmission in our community. This patient is at least at moderate risk for complications without adequate follow up. This format is felt to be most appropriate for this patient at this time. Physical exam was limited by quality of the video and audio technology used for the visit. Shamaine, CMA was able to get the patient set up on a video visit.  Patient location: home Patient and provider in visit Provider location: Office  I discussed the limitations of evaluation and management by telemedicine and the availability of in person appointments. The patient expressed understanding and agreed to proceed.  Visit Date: 02/27/2022  Today's healthcare provider: Penni Homans, MD     Subjective:    Patient ID: Shannon [name], female    DOB: [birth year], 77 y.o.   MRN: [id number]  No chief complaint on file.   HPI Patient is in today for follow up on chronic medical concerns. She continues to struggle with anhedonia and anxiety around her dementia diagnosis but is managing her ADLs at home adequately and continues to follow with neurology. No recent febrile illness or acute hospitalizations. Denies CP/palp/SOB/HA/congestion/fevers/GI or GU c/o. 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 Echo 2012: EF 45-50%;  b. Lex MV 2/12:  low risk, apical defect (small area of ischemia vs shifting breast atten);  c.  Echo 7/12: Normal wall thickness, EF 60-65%, normal wall motion, grade 1 diastolic dysfunction, mild LAE, PASP 32;   d. Lex MV 11/13:  EF 76%, no ischemia   OA (osteoarthritis) 09/07/2011   Obesity 09/28/2014   Osteoporosis 03/07/2011   DEXA T score -2.6 AP spine 03/07/11    Formatting of this note might be different from the original. Formatting of this note might be different from the  original. DEXA T score -2.6 AP spine 03/07/11   Last Assessment & Plan:  Formatting of this note might be different from the original. Encouraged to get adequate exercise, calcium and vitamin d intake   Pain in joint, ankle and foot 09/28/2014   Palpitation 10/28/2020   Personal history of chemotherapy 2012   Personal history of radiation therapy 2012   Prediabetes 06/15/2009   Recurrent falls 12/28/2015   Right knee pain 07/10/2020   Shortness of breath    UTI (urinary tract  infection) 03/03/2012   Vertebral fracture, osteoporotic, sequela 04/05/2016   Vitamin D deficiency 06/14/2017   Supplement and monitor    Past Surgical History:  Procedure Laterality Date   BREAST LUMPECTOMY  04/2009   RIGHT FOR BREAST CANCER-CHEMO/RADIATION X 1 YEAR   BREAST REDUCTION SURGERY Bilateral 05/04/2014   Procedure: MAMMARY REDUCTION  (BREAST);  Surgeon: Cristine Polio, MD;  Location: [place];  Service: Plastics;  Laterality: Bilateral;   CARPAL TUNNEL RELEASE     L wrist, ulnar nerve moved   CHOLECYSTECTOMY     CRANIOTOMY FOR TUMOR  2000   ELBOW SURGERY     left   KNEE ARTHROSCOPY Left 10/12/2014   Procedure: LEFT KNEE ARTHROSCOPY ;  Surgeon: Gaynelle Arabian, MD;  Location: [facility];  Service: Orthopedics;  Laterality: Left;   KYPHOPLASTY N/A 03/30/2016   Procedure: THORACIC 12 KYPHOPLASTY;  Surgeon: Phylliss Bob, MD;  Location: [place];  Service: Orthopedics;  Laterality: N/A;  THORACIC 12 KYPHOPLASTY   LEFT HEART CATHETERIZATION WITH CORONARY ANGIOGRAM N/A 12/16/2013   Procedure: LEFT HEART CATHETERIZATION WITH CORONARY ANGIOGRAM;  Surgeon: Peter M [name], MD;  Location: [facility];  Service: Cardiovascular;  Laterality: N/A;   LIPOMA EXCISION  03/28/2009   right leg   MELANOMA EXCISION     PORT-A-CATH REMOVAL  11/30/2010   Streck   porta cath     PORTACATH PLACEMENT  may 2011   REDUCTION MAMMAPLASTY Bilateral    SYNOVECTOMY Left 10/12/2014   Procedure: WITH SYNOVECTOMY;  Surgeon: Gaynelle Arabian, MD;  Location: [facility];  Service: Orthopedics;  Laterality: Left;   TONSILLECTOMY  1958   TOTAL HIP ARTHROPLASTY Left 11/21/2021   Procedure: TOTAL HIP ARTHROPLASTY ANTERIOR APPROACH;  Surgeon: Rod Can, MD;  Location: [facility];  Service: Orthopedics;  Laterality: Left;   TOTAL KNEE ARTHROPLASTY  02/05/2012   Procedure: TOTAL KNEE ARTHROPLASTY;  Surgeon: Gearlean Alf, MD;  Location: [facility];  Service: Orthopedics;  Laterality: Right;   TOTAL KNEE ARTHROPLASTY  Left 02/10/2013   Procedure: LEFT TOTAL KNEE ARTHROPLASTY;  Surgeon: Gearlean Alf, MD;  Location: [facility];  Service: Orthopedics;  Laterality: Left;   total knee raplacement  01-2012   Right Knee   TOTAL KNEE REVISION Left 07/14/2020   Procedure: TOTAL KNEE REVISION;  Surgeon: Gaynelle Arabian, MD;  Location: [facility];  Service: Orthopedics;  Laterality: Left;   TUBAL LIGATION  1997    Family History  Problem Relation Age of Onset   Breast cancer Mother        sarcoma   Lung cancer Mother    Hypertension Mother    Prostate cancer Father    Congestive Heart Failure Father    Heart attack Father    Prostate cancer Brother    Heart disease Maternal Grandfather        MI   Down syndrome Son    CVA Son    Stomach cancer Maternal Aunt    Dementia Maternal  Aunt    Uterine cancer Maternal Aunt    Colon cancer Neg Hx     Social History   Socioeconomic History   Marital status: Married    Spouse name: Icelynn Onken   Number of children: 3   Years of education: 12   Highest education level: High school graduate  Occupational History   Occupation: boutique owner-retired    Employer: [employer]  Tobacco Use   Smoking status: Never    Passive exposure: Never   Smokeless tobacco: Never  Vaping Use   Vaping Use: Never used  Substance and Sexual Activity   Alcohol use: No   Drug use: No   Sexual activity: Not Currently  Other Topics Concern   Not on file  Social History Narrative   Patient is right-handed. She lives with her husband in a 4 story home. They take care of their grown son with down syndrome who has had a stroke. She drinks 1-2 glasses of tea in the evenings. She does not formally exercise.      Social Determinants of Health   Financial Resource Strain: Low Risk  (02/15/2022)   Overall Financial Resource Strain (CARDIA)    Difficulty of Paying Living Expenses: Not hard at all  Food Insecurity: No Food Insecurity (02/15/2022)   Hunger Vital Sign    Worried About  Running Out of Food in the Last Year: Never true    Ran Out of Food in the Last Year: Never true  Transportation Needs: No Transportation Needs (02/15/2022)   PRAPARE - Hydrologist (Medical): No    Lack of Transportation (Non-Medical): No  Physical Activity: Inactive (02/15/2022)   Exercise Vital Sign    Days of Exercise per Week: 0 days    Minutes of Exercise per Session: 0 min  Stress: Stress Concern Present (02/15/2022)   [place]    Feeling of Stress : To some extent  Social Connections: Moderately Integrated (02/15/2022)   Social Connection and Isolation Panel [NHANES]    Frequency of Communication with Friends and Family: More than three times a week    Frequency of Social Gatherings with Friends and Family: More than three times a week    Attends Religious Services: 1 to 4 times per year    Active Member of [employer] or Organizations: No    Attends Archivist Meetings: Never    Marital Status: Married  Human resources officer Violence: Not At Risk (02/15/2022)   Humiliation, Afraid, Rape, and Kick questionnaire    Fear of Current or Ex-Partner: No    Emotionally Abused: No    Physically Abused: No    Sexually Abused: No    Outpatient Medications Prior to Visit  Medication Sig Dispense Refill   alendronate (FOSAMAX) 70 MG tablet Take 1 tablet (70 mg total) by mouth every 7 (seven) days. Take with a full glass of water on an empty stomach. 4 tablet 3   acetaminophen (TYLENOL) 325 MG tablet Take 2 tablets (650 mg total) by mouth every 6 (six) hours as needed for mild pain or moderate pain (pain score 1-3 or temp > 100.5).     albuterol (VENTOLIN HFA) 108 (90 Base) MCG/ACT inhaler INHALE 1-2 PUFFS INTO THE LUNGS EVERY 6 (SIX) HOURS AS NEEDED FOR WHEEZING OR SHORTNESS OF BREATH. 18 each 3   apixaban (ELIQUIS) 5 MG TABS tablet Take 1 tablet (5 mg total) by mouth 2 (two) times daily.  60 tablet  2   Calcium Carbonate (CALCIUM 600 PO) Take 1 tablet by mouth daily.     carvedilol (COREG) 25 MG tablet TAKE 1 TABLET TWICE DAILY WITH A MEAL (Patient taking differently: Take 25 mg by mouth 2 (two) times daily with a meal.) 180 tablet 3   cetirizine (ZYRTEC) 10 MG tablet Take 10 mg by mouth at bedtime.     cholecalciferol (VITAMIN D3) 25 MCG (1000 UNIT) tablet Take 1,000 Units by mouth daily.     diltiazem (CARDIZEM CD) 120 MG 24 hr capsule TAKE 1 CAPSULE EVERY DAY 90 capsule 3   docusate sodium (COLACE) 100 MG capsule Take 1 capsule (100 mg total) by mouth 2 (two) times daily. (Patient taking differently: Take 100 mg by mouth 2 (two) times daily as needed.) 10 capsule 0   donepezil (ARICEPT) 10 MG tablet TAKE 1 TABLET AT BEDTIME (Patient taking differently: Take 10 mg by mouth at bedtime.) 90 tablet 1   famotidine (PEPCID) 20 MG tablet TAKE 1 TABLET TWICE DAILY 180 tablet 1   ferrous sulfate 325 (65 FE) MG tablet Take 325 mg by mouth daily with breakfast.     folic acid (FOLVITE) 1 MG tablet Take 1 tablet (1 mg total) by mouth daily. 90 tablet 0   gabapentin (NEURONTIN) 300 MG capsule Take 1 capsule (300 mg total) by mouth 2 (two) times daily. 60 capsule 5   hydrOXYzine (ATARAX) 25 MG tablet TAKE 0.5-1 TABLETS (12.5-25 MG TOTAL) BY MOUTH 2 (TWO) TIMES DAILY AS NEEDED. (Patient taking differently: Take 25 mg by mouth 2 (two) times daily as needed (allergies).) 180 tablet 0   Multiple Vitamin (MULTIVITAMIN WITH MINERALS) TABS tablet Take 1 tablet by mouth daily.     omeprazole (PRILOSEC) 20 MG capsule Take 20 mg by mouth at bedtime.     pentosan polysulfate (ELMIRON) 100 MG capsule Take 200 mg by mouth in the morning and at bedtime.     polyethylene glycol (MIRALAX / GLYCOLAX) 17 g packet Take 17 g by mouth daily as needed for mild constipation. 14 each 0   rosuvastatin (CRESTOR) 20 MG tablet TAKE 1 TABLET AT BEDTIME (Patient taking differently: Take 20 mg by mouth at bedtime.) 90 tablet 3    venlafaxine (EFFEXOR) 75 MG tablet Take 225 mg by mouth in the morning.     No facility-administered medications prior to visit.    Allergies  Allergen Reactions   Dilaudid [Hydromorphone Hcl] Itching and Other (See Comments)    "Went Crazy"     Review of Systems  Constitutional:  Negative for fever and malaise/fatigue.  HENT:  Negative for congestion.   Eyes:  Negative for blurred vision.  Respiratory:  Negative for shortness of breath.   Cardiovascular:  Negative for chest pain, palpitations and leg swelling.  Gastrointestinal:  Negative for abdominal pain, blood in stool and nausea.  Genitourinary:  Negative for dysuria and frequency.  Musculoskeletal:  Negative for falls.  Skin:  Negative for rash.  Neurological:  Negative for dizziness, loss of consciousness and headaches.  Endo/Heme/Allergies:  Negative for environmental allergies.  Psychiatric/Behavioral:  Positive for depression and memory loss. Negative for hallucinations, substance abuse and suicidal ideas. The patient is nervous/anxious.        Objective:    Physical Exam Constitutional:      General: She is not in acute distress.    Appearance: Normal appearance. She is not ill-appearing or toxic-appearing.  HENT:     Head: Normocephalic and atraumatic.  Right Ear: External ear normal.     Left Ear: External ear normal.     Nose: Nose normal.  Eyes:     General:        Right eye: No discharge.        Left eye: No discharge.  Pulmonary:     Effort: Pulmonary effort is normal.  Skin:    Findings: No rash.  Neurological:     Mental Status: She is alert and oriented to person, place, and time.  Psychiatric:        Behavior: Behavior normal.     LMP 01/30/1994  Wt Readings from Last 3 Encounters:  02/15/22 152 lb (68.9 kg)  02/10/22 163 lb (73.9 kg)  12/27/21 165 lb (74.8 kg)       Assessment & Plan:  Dementia, unspecified dementia severity, unspecified dementia type, unspecified whether  behavioral, psychotic, or mood disturbance or anxiety ([place]) Assessment & Plan: Following with neurology and tolerating Aricept  Orders: -     Ambulatory referral to [place]  Depression with anxiety Assessment & Plan: Has been referred to behavioral health and starts with them soon. On Venlafaxine  Orders: -     Ambulatory referral to Behavioral Health  Osteoporosis, unspecified osteoporosis type, unspecified pathological fracture presence Assessment & Plan: Tolerating Fosamax   Pulmonary embolism without acute cor pulmonale, unspecified chronicity, unspecified pulmonary embolism type [facility]) Assessment & Plan: Tolerating Eliquis   Prediabetes Assessment & Plan: hgba1c acceptable, minimize simple carbs. Increase exercise as tolerated.    Vitamin D deficiency Assessment & Plan: Supplement and monitor    Chronic diastolic CHF (congestive heart failure) (HCC) Assessment & Plan: No recent exacerbation, no changes      I discussed the assessment and treatment plan with the patient. The patient was provided an opportunity to ask questions and all were answered. The patient agreed with the plan and demonstrated an understanding of the instructions.   The patient was advised to call back or seek an in-person evaluation if the symptoms worsen or if the condition fails to improve as anticipated.  Penni Homans, MD [facility] [place] (phone) [phone] (fax)  [place]

## 2022-03-05 NOTE — Assessment & Plan Note (Signed)
No recent exacerbation, no changes

## 2022-03-15 ENCOUNTER — Ambulatory Visit (INDEPENDENT_AMBULATORY_CARE_PROVIDER_SITE_OTHER): Payer: Medicare Other | Admitting: Psychology

## 2022-03-15 DIAGNOSIS — F4323 Adjustment disorder with mixed anxiety and depressed mood: Secondary | ICD-10-CM | POA: Diagnosis not present

## 2022-03-15 NOTE — Progress Notes (Signed)
[facility] Counselor/Therapist Progress Note  Patient ID: Shannon [name], MRN: [id number],    Date: 03/15/2022  Time Spent: 3:00pm-3:50pm     50 minutes   Treatment Type: Individual Therapy  Reported Symptoms: stress, sadness  Mental Status Exam: Appearance:  Casual     Behavior: Appropriate  Motor: Normal  Speech/Language:  Normal Rate  Affect: Appropriate  Mood: normal  Thought process: normal  Thought content:   WNL  Sensory/Perceptual disturbances:   WNL  Orientation: oriented to person, place, time/date, and situation  Attention: Good  Concentration: Good  Memory: WNL  Fund of knowledge:  Good  Insight:   Good  Judgment:  Good  Impulse Control: Good   Risk Assessment: Danger to Self:  No Self-injurious Behavior: No Danger to Others: No Duty to Warn:no Physical Aggression / Violence:No  Access to Firearms a concern: No  Gang Involvement:No   Subjective:  Pt present for individual therapy via phone.  Pt consents to telehealth session due to [place] 19 pandemic. Location of pt: home Location of therapist: home office.   Pt talked about being upset at her husband.  He does nothing for her for valentine's day.  They have a history of marital problems.  Husband has been unfaithful in the past.  This is very hurtful to pt.   Pt does not feel her husband will take care of her.  She states her daughter will have to take care of her as she declines.  Pt was tearful as she talked about feeling so hurt.  Helped pt process her feelings.   Pt is upset about having cognitive decline and scared about worried about what the future holds for her.   Pt is working on Medical illustrator and it is very overwhelming.   Worked on self care. Provided supportive therapy.    Interventions: Cognitive Behavioral Therapy and Insight-Oriented  Diagnosis:  F43.23  Plan: Plan of Care:  Recommend ongoing therapy.   Pt participated in setting treatment goals.   Plan to meet every two  weeks.  Treatment Plan (Treatment Plan Target Date: 04/22/2022) Client Abilities/Strengths  Pt is engaging and motivated for therapy.  Client Treatment Preferences  Individual therapy.  Client Statement of Needs  Improve copings skills and have a place to talk about her feelings.   Symptoms  Depressed or irritable mood. Excessive and/or unrealistic worry that is difficult to control occurring more days than not for at least 6 months about a number of events or activities. Hypervigilance (e.g., feeling constantly on edge, experiencing concentration difficulties, having trouble falling or staying asleep, exhibiting a general state of irritability). Low self-esteem. Problems Addressed  Unipolar Depression, Anxiety Goals 1. Alleviate depressive symptoms and return to previous level of effective functioning. 2. Appropriately grieve the loss in order to normalize mood and to return to previously adaptive level of functioning. Objective Learn and implement behavioral strategies to overcome depression. Target Date: 2022-04-22 Frequency: Biweekly  Progress: 10 Modality: individual  Related Interventions Assist the client in developing skills that increase the likelihood of deriving pleasure from behavioral activation (e.g., assertiveness skills, developing an exercise plan, less internal/more external focus, increased social involvement); reinforce success. Engage the client in "behavioral activation," increasing his/her activity level and contact with sources of reward, while identifying processes that inhibit activation. use behavioral techniques such as instruction, rehearsal, role-playing, role reversal, as needed, to facilitate activity in the client's daily life; reinforce success. 3. Develop healthy interpersonal relationships that lead to the alleviation and help prevent  the relapse of depression. 4. Develop healthy thinking patterns and beliefs about self, others, and the world that lead to the  alleviation and help prevent the relapse of depression. 5. Enhance ability to effectively cope with the full variety of life's worries and anxieties. 6. Learn and implement coping skills that result in a reduction of anxiety and worry, and improved daily functioning. Objective Learn and implement problem-solving strategies for realistically addressing worries. Target Date: 2022-04-22 Frequency: Biweekly  Progress: 10 Modality: individual  Related Interventions Assign the client a homework exercise in which he/she problem-solves a current problem (see Mastery of Your Anxiety and Worry: Workbook by Adora Fridge and Eliot Ford or Generalized Anxiety Disorder by Eather Colas, and Eliot Ford); review, reinforce success, and provide corrective feedback toward improvement. Teach the client problem-solving strategies involving specifically defining a problem, generating options for addressing it, evaluating the pros and cons of each option, selecting and implementing an optional action, and reevaluating and refining the action. Objective Learn and implement calming skills to reduce overall anxiety and manage anxiety symptoms. Target Date: 2022-04-22 Frequency: Biweekly  Progress: 10 Modality: individual  Related Interventions Assign the client to read about progressive muscle relaxation and other calming strategies in relevant books or treatment manuals (e.g., Progressive Relaxation Training by Leroy Kennedy; Mastery of Your Anxiety and Worry: Workbook by Beckie Busing). Assign the client homework each session in which he/she practices relaxation exercises daily, gradually applying them progressively from non-anxiety-provoking to anxiety-provoking situations; review and reinforce success while providing corrective feedback toward improvement. Teach the client calming/relaxation skills (e.g., applied relaxation, progressive muscle relaxation, cue controlled relaxation; mindful breathing; biofeedback) and  how to discriminate better between relaxation and tension; teach the client how to apply these skills to his/her daily life. 7. Recognize, accept, and cope with feelings of depression. 8. Reduce overall frequency, intensity, and duration of the anxiety so that daily functioning is not impaired. 9. Resolve the core conflict that is the source of anxiety. 10. Stabilize anxiety level while increasing ability to function on a daily basis. Diagnosis F43.23   Clint Bolder, LCSW

## 2022-03-20 ENCOUNTER — Telehealth: Payer: Self-pay | Admitting: Family Medicine

## 2022-03-20 NOTE — Telephone Encounter (Signed)
Copied from [place] [id number]. Topic: Medicare AWV >> Mar 20, 2022  1:56 PM Devoria Glassing wrote: Reason for CRM:  Left message for patient to call back and schedule Medicare Annual Wellness Visit (AWV).  Last date of AWV: 02/06/2019  Please schedule an appointment at any time with [facility].  If any questions, please contact me.  Thank you ,  [place] Direct Dial: [phone]

## 2022-03-25 ENCOUNTER — Other Ambulatory Visit: Payer: Self-pay | Admitting: Family Medicine

## 2022-03-28 ENCOUNTER — Ambulatory Visit: Payer: Medicare Other

## 2022-03-28 DIAGNOSIS — R350 Frequency of micturition: Secondary | ICD-10-CM | POA: Diagnosis not present

## 2022-03-28 DIAGNOSIS — R3581 Nocturnal polyuria: Secondary | ICD-10-CM | POA: Diagnosis not present

## 2022-03-28 DIAGNOSIS — N3010 Interstitial cystitis (chronic) without hematuria: Secondary | ICD-10-CM | POA: Diagnosis not present

## 2022-03-28 DIAGNOSIS — R102 Pelvic and perineal pain: Secondary | ICD-10-CM | POA: Diagnosis not present

## 2022-03-28 DIAGNOSIS — R3915 Urgency of urination: Secondary | ICD-10-CM | POA: Diagnosis not present

## 2022-03-28 DIAGNOSIS — R351 Nocturia: Secondary | ICD-10-CM | POA: Diagnosis not present

## 2022-04-05 ENCOUNTER — Other Ambulatory Visit: Payer: Self-pay | Admitting: Family Medicine

## 2022-04-12 ENCOUNTER — Other Ambulatory Visit: Payer: Self-pay | Admitting: Physician Assistant

## 2022-04-17 DIAGNOSIS — D225 Melanocytic nevi of trunk: Secondary | ICD-10-CM | POA: Diagnosis not present

## 2022-04-17 DIAGNOSIS — L821 Other seborrheic keratosis: Secondary | ICD-10-CM | POA: Diagnosis not present

## 2022-04-17 DIAGNOSIS — D1801 Hemangioma of skin and subcutaneous tissue: Secondary | ICD-10-CM | POA: Diagnosis not present

## 2022-04-17 DIAGNOSIS — D2261 Melanocytic nevi of right upper limb, including shoulder: Secondary | ICD-10-CM | POA: Diagnosis not present

## 2022-05-04 ENCOUNTER — Other Ambulatory Visit: Payer: Self-pay | Admitting: Family Medicine

## 2022-05-04 ENCOUNTER — Ambulatory Visit: Payer: Medicare Other | Admitting: Psychology

## 2022-05-04 ENCOUNTER — Other Ambulatory Visit: Payer: Self-pay | Admitting: Cardiology

## 2022-05-04 NOTE — Progress Notes (Deleted)
° ° ° ° ° ° ° ° ° ° ° ° ° ° °  Markise Haymer, LCSW °

## 2022-05-11 DIAGNOSIS — L900 Lichen sclerosus et atrophicus: Secondary | ICD-10-CM | POA: Diagnosis not present

## 2022-05-19 ENCOUNTER — Other Ambulatory Visit: Payer: Self-pay | Admitting: Family Medicine

## 2022-05-23 NOTE — Progress Notes (Signed)
HPI: FU nonischemic cardiomyopathy. Patient had an echocardiogram in 2012 ejection fraction of 45-50%. Reduced LV function felt secondary to chemotherapy (herceptin). Cardiac catheterization November 2015 showed no obstructive coronary disease. Ejection fraction 50-55%. Carotid Dopplers January 2018 showed 1-39% bilateral stenosis. Monitor September 2022 showed normal sinus rhythm with short runs of SVT up to 29.5 seconds, rare PVC, PAC and ventricular couplet. Patient had a mechanical fall and left total hip arthroplasty October 2023.  She was subsequently diagnosed with pulmonary embolus.  Venous Dopplers October 2023 showed no DVT.  CTA October 2023 showed right lower lobe pulmonary emboli.  Echocardiogram November 2023 showed normal LV function, grade 1 diastolic dysfunction.  Since last seen, she denies dyspnea, chest pain, palpitations or syncope.  No pedal edema.  Current Outpatient Medications  Medication Sig Dispense Refill   acetaminophen (TYLENOL) 325 MG tablet Take 2 tablets (650 mg total) by mouth every 6 (six) hours as needed for mild pain or moderate pain (pain score 1-3 or temp > 100.5).     albuterol (VENTOLIN HFA) 108 (90 Base) MCG/ACT inhaler INHALE 1-2 PUFFS INTO THE LUNGS EVERY 6 (SIX) HOURS AS NEEDED FOR WHEEZING OR SHORTNESS OF BREATH. 18 each 3   alendronate (FOSAMAX) 70 MG tablet TAKE 1 TABLET (70 MG TOTAL) BY MOUTH EVERY 7 (SEVEN) DAYS. TAKE WITH A FULL GLASS OF WATER ON AN EMPTY STOMACH. 12 tablet 3   apixaban (ELIQUIS) 5 MG TABS tablet TAKE 1 TABLET TWICE DAILY 180 tablet 1   Calcium Carbonate (CALCIUM 600 PO) Take 1 tablet by mouth daily.     carvedilol (COREG) 25 MG tablet TAKE 1 TABLET TWICE DAILY WITH A MEAL 60 tablet 1   cetirizine (ZYRTEC) 10 MG tablet Take 10 mg by mouth at bedtime.     cholecalciferol (VITAMIN D3) 25 MCG (1000 UNIT) tablet Take 1,000 Units by mouth daily.     diltiazem (CARDIZEM CD) 120 MG 24 hr capsule TAKE 1 CAPSULE EVERY DAY 90 capsule 3    docusate sodium (COLACE) 100 MG capsule Take 1 capsule (100 mg total) by mouth 2 (two) times daily. (Patient taking differently: Take 100 mg by mouth 2 (two) times daily as needed.) 10 capsule 0   donepezil (ARICEPT) 10 MG tablet TAKE 1 TABLET AT BEDTIME 90 tablet 3   famotidine (PEPCID) 20 MG tablet TAKE 1 TABLET TWICE DAILY 180 tablet 3   ferrous sulfate 325 (65 FE) MG tablet Take 325 mg by mouth daily with breakfast.     folic acid (FOLVITE) 1 MG tablet Take 1 tablet (1 mg total) by mouth daily. 90 tablet 0   gabapentin (NEURONTIN) 300 MG capsule Take 1 capsule (300 mg total) by mouth 2 (two) times daily. 60 capsule 5   hydrOXYzine (ATARAX) 25 MG tablet TAKE 0.5-1 TABLETS (12.5-25 MG TOTAL) BY MOUTH 2 (TWO) TIMES DAILY AS NEEDED. (Patient taking differently: Take 25 mg by mouth 2 (two) times daily as needed (allergies).) 180 tablet 0   Multiple Vitamin (MULTIVITAMIN WITH MINERALS) TABS tablet Take 1 tablet by mouth daily.     omeprazole (PRILOSEC) 20 MG capsule TAKE 1 CAPSULE BY MOUTH EVERY DAY 90 capsule 1   pentosan polysulfate (ELMIRON) 100 MG capsule Take 200 mg by mouth in the morning and at bedtime.     polyethylene glycol (MIRALAX / GLYCOLAX) 17 g packet Take 17 g by mouth daily as needed for mild constipation. 14 each 0   rosuvastatin (CRESTOR) 20 MG tablet TAKE 1 TABLET  AT BEDTIME (Patient taking differently: Take 20 mg by mouth at bedtime.) 90 tablet 3   venlafaxine (EFFEXOR) 75 MG tablet Take 225 mg by mouth in the morning.     No current facility-administered medications for this visit.     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 Echo 2012: EF 45-50%;  b. Lex MV 2/12:  low risk, apical defect (small area of ischemia vs shifting breast atten);  c.  Echo 7/12: Normal wall thickness, EF 60-65%, normal wall motion, grade 1 diastolic dysfunction, mild LAE, PASP 32;   d. Lex MV 11/13:  EF 76%, no ischemia   OA (osteoarthritis) 09/07/2011   Obesity 09/28/2014   Osteoporosis 03/07/2011   DEXA T score -2.6 AP spine 03/07/11    Formatting of this note might be different from the original. Formatting of this note might be different from the original. DEXA T score -2.6 AP spine 03/07/11   Last Assessment & Plan:  Formatting of this note might be different from the original. Encouraged to get adequate exercise, calcium and vitamin d intake   Pain in joint, ankle and foot 09/28/2014   Palpitation 10/28/2020   Personal history of chemotherapy 2012   Personal history of radiation therapy 2012   Prediabetes 06/15/2009   Recurrent  falls 12/28/2015   Right knee pain 07/10/2020   Shortness of breath    UTI (urinary tract infection) 03/03/2012   Vertebral fracture, osteoporotic, sequela 04/05/2016   Vitamin D deficiency 06/14/2017   Supplement and monitor    Past Surgical History:  Procedure Laterality Date   BREAST LUMPECTOMY  04/2009   RIGHT FOR BREAST CANCER-CHEMO/RADIATION X 1 YEAR   BREAST REDUCTION SURGERY Bilateral 05/04/2014   Procedure: MAMMARY REDUCTION  (BREAST);  Surgeon: Louisa Second, MD;  Location: [facility];  Service: Plastics;  Laterality: Bilateral;   CARPAL TUNNEL RELEASE     L wrist, ulnar nerve moved   CHOLECYSTECTOMY     CRANIOTOMY FOR TUMOR  2000   ELBOW SURGERY  left   KNEE ARTHROSCOPY Left 10/12/2014   Procedure: LEFT KNEE ARTHROSCOPY ;  Surgeon: Ollen Gross, MD;  Location: [facility];  Service: Orthopedics;  Laterality: Left;   KYPHOPLASTY N/A 03/30/2016   Procedure: THORACIC 12 KYPHOPLASTY;  Surgeon: Estill Bamberg, MD;  Location: [facility];  Service: Orthopedics;  Laterality: N/A;  THORACIC 12 KYPHOPLASTY   LEFT HEART CATHETERIZATION WITH CORONARY ANGIOGRAM N/A 12/16/2013   Procedure: LEFT HEART CATHETERIZATION WITH CORONARY ANGIOGRAM;  Surgeon: Peter M Swaziland, MD;  Location: [facility];  Service: Cardiovascular;  Laterality: N/A;   LIPOMA EXCISION  03/28/2009   right leg   MELANOMA EXCISION     PORT-A-CATH REMOVAL  11/30/2010   Streck   porta cath     PORTACATH PLACEMENT  may 2011   REDUCTION MAMMAPLASTY Bilateral    SYNOVECTOMY Left 10/12/2014   Procedure: WITH SYNOVECTOMY;  Surgeon: Ollen Gross, MD;  Location: [facility];  Service: Orthopedics;  Laterality: Left;   TONSILLECTOMY  1958   TOTAL HIP ARTHROPLASTY Left 11/21/2021   Procedure: TOTAL HIP ARTHROPLASTY ANTERIOR APPROACH;  Surgeon: Samson Frederic, MD;  Location: [facility];  Service: Orthopedics;  Laterality: Left;   TOTAL KNEE ARTHROPLASTY  02/05/2012   Procedure: TOTAL KNEE ARTHROPLASTY;  Surgeon: Loanne Drilling,  MD;  Location: [facility];  Service: Orthopedics;  Laterality: Right;   TOTAL KNEE ARTHROPLASTY Left 02/10/2013   Procedure: LEFT TOTAL KNEE ARTHROPLASTY;  Surgeon: Loanne Drilling, MD;  Location: [facility];  Service: Orthopedics;  Laterality: Left;   total knee raplacement  01-2012   Right Knee   TOTAL KNEE REVISION Left 07/14/2020   Procedure: TOTAL KNEE REVISION;  Surgeon: Ollen Gross, MD;  Location: [facility];  Service: Orthopedics;  Laterality: Left;   TUBAL LIGATION  1997    Social History   Socioeconomic History   Marital status: Married    Spouse name: Duaa Stelzner   Number of children: 3   Years of education: 12   Highest education level: High school graduate  Occupational History   Occupation: boutique owner-retired    Employer: Producer, television/film/video  Tobacco Use   Smoking status: Never    Passive exposure: Never   Smokeless tobacco: Never  Vaping Use   Vaping Use: Never used  Substance and Sexual Activity   Alcohol use: No   Drug use: No   Sexual activity: Not Currently  Other Topics Concern   Not on file  Social History Narrative   Patient is right-handed. She lives with her husband in a 4 story home. They take care of their grown son with down syndrome who has had a stroke. She drinks 1-2 glasses of tea in the evenings. She does not formally exercise.      Social Determinants of Health   Financial Resource Strain: Low Risk  (02/15/2022)   Overall Financial Resource Strain (CARDIA)    Difficulty of Paying Living Expenses: Not hard at all  Food Insecurity: No Food Insecurity (02/15/2022)   Hunger Vital Sign    Worried About Running Out of Food in the Last Year: Never true    Ran Out of Food in the Last Year: Never true  Transportation Needs: No Transportation Needs (02/15/2022)   PRAPARE - Administrator, Civil Service (Medical): No    Lack of Transportation (Non-Medical): No  Physical Activity: Inactive (02/15/2022)   Exercise Vital Sign    Days of Exercise per  Week: 0 days    Minutes of Exercise per Session: 0  min  Stress: Stress Concern Present (02/15/2022)   [employer] of Occupational Health - Occupational Stress Questionnaire    Feeling of Stress : To some extent  Social Connections: Moderately Integrated (02/15/2022)   Social Connection and Isolation Panel [NHANES]    Frequency of Communication with Friends and Family: More than three times a week    Frequency of Social Gatherings with Friends and Family: More than three times a week    Attends Religious Services: 1 to 4 times per year    Active Member of [employer] or Organizations: No    Attends Banker Meetings: Never    Marital Status: Married  Catering manager Violence: Not At Risk (02/15/2022)   Humiliation, Afraid, Rape, and Kick questionnaire    Fear of Current or Ex-Partner: No    Emotionally Abused: No    Physically Abused: No    Sexually Abused: No    Family History  Problem Relation Age of Onset   Breast cancer Mother        sarcoma   Lung cancer Mother    Hypertension Mother    Prostate cancer Father    Congestive Heart Failure Father    Heart attack Father    Prostate cancer Brother    Heart disease Maternal Grandfather        MI   Down syndrome Son    CVA Son    Stomach cancer Maternal Aunt    Dementia Maternal Aunt    Uterine cancer Maternal Aunt    Colon cancer Neg Hx     ROS: no fevers or chills, productive cough, hemoptysis, dysphasia, odynophagia, melena, hematochezia, dysuria, hematuria, rash, seizure activity, orthopnea, PND, pedal edema, claudication. Remaining systems are negative.  Physical Exam: Well-developed well-nourished in no acute distress.  Skin is warm and dry.  HEENT is normal.  Neck is supple.  Chest is clear to auscultation with normal expansion.  Cardiovascular exam is regular rate and rhythm.  Abdominal exam nontender or distended. No masses palpated. Extremities show no edema. neuro grossly intact  A/P  1  nonischemic cardiomyopathy-LV function normal on most recent echocardiogram.  Continue beta-blocker.  2 hypertension-blood pressure elevated; I have asked her to follow this at home and we will increase medical regimen as needed.  3 hyperlipidemia-continue statin.  4 chronic diastolic congestive heart failure-patient remains euvolemic on examination.  Will continue fluid restriction and low-sodium diet.  5 history of palpitations-continue present medications.  Symptoms are controlled.  6 pulmonary embolus-occurred following recent hip replacement.  Continue apixaban.  In 1 month she will have completed 6 months.  Episode was likely secondary to hip fracture/surgery and she has not had a pulmonary embolus in the past.  Will discontinue apixaban in 1 month.  Olga Millers, MD

## 2022-05-24 ENCOUNTER — Other Ambulatory Visit: Payer: Self-pay | Admitting: Family Medicine

## 2022-05-24 DIAGNOSIS — K2100 Gastro-esophageal reflux disease with esophagitis, without bleeding: Secondary | ICD-10-CM

## 2022-05-25 DIAGNOSIS — L900 Lichen sclerosus et atrophicus: Secondary | ICD-10-CM | POA: Diagnosis not present

## 2022-05-25 DIAGNOSIS — L98499 Non-pressure chronic ulcer of skin of other sites with unspecified severity: Secondary | ICD-10-CM | POA: Diagnosis not present

## 2022-05-30 ENCOUNTER — Ambulatory Visit: Payer: Medicare Other | Attending: Cardiology | Admitting: Cardiology

## 2022-05-30 ENCOUNTER — Encounter: Payer: Self-pay | Admitting: Cardiology

## 2022-05-30 VITALS — BP 148/82 | HR 68 | Ht 63.0 in | Wt 169.4 lb

## 2022-05-30 DIAGNOSIS — I10 Essential (primary) hypertension: Secondary | ICD-10-CM | POA: Diagnosis not present

## 2022-05-30 DIAGNOSIS — I2699 Other pulmonary embolism without acute cor pulmonale: Secondary | ICD-10-CM

## 2022-05-30 DIAGNOSIS — E782 Mixed hyperlipidemia: Secondary | ICD-10-CM | POA: Diagnosis not present

## 2022-05-30 DIAGNOSIS — R002 Palpitations: Secondary | ICD-10-CM

## 2022-05-30 NOTE — Patient Instructions (Signed)
Medication Instructions:   STOP ELIQUIS IN ONE MONTH  *If you need a refill on your cardiac medications before your next appointment, please call your pharmacy*   Follow-Up: At [facility], you and your health needs are our priority.  As part of our continuing mission to provide you with exceptional heart care, we have created designated Provider Care Teams.  These Care Teams include your primary Cardiologist (physician) and Advanced Practice Providers (APPs -  Physician Assistants and Nurse Practitioners) who all work together to provide you with the care you need, when you need it.  We recommend signing up for the patient portal called "MyChart".  Sign up information is provided on this After Visit Summary.  MyChart is used to connect with patients for Virtual Visits (Telemedicine).  Patients are able to view lab/test results, encounter notes, upcoming appointments, etc.  Non-urgent messages can be sent to your provider as well.   To learn more about what you can do with MyChart, go to [email or web address].    Your next appointment:   12 month(s)  Provider:   Olga Millers, MD

## 2022-07-05 ENCOUNTER — Ambulatory Visit
Admission: RE | Admit: 2022-07-05 | Discharge: 2022-07-05 | Disposition: A | Discharge status: Home / Self Care | Payer: Medicare Other | Source: Ambulatory Visit | Attending: Adult Health | Admitting: Adult Health

## 2022-07-05 DIAGNOSIS — Z1231 Encounter for screening mammogram for malignant neoplasm of breast: Secondary | ICD-10-CM | POA: Diagnosis not present

## 2022-07-12 ENCOUNTER — Other Ambulatory Visit: Payer: Self-pay | Admitting: Cardiology

## 2022-07-18 ENCOUNTER — Ambulatory Visit (INDEPENDENT_AMBULATORY_CARE_PROVIDER_SITE_OTHER): Payer: Medicare Other | Admitting: Family Medicine

## 2022-07-18 VITALS — BP 132/78 | HR 68 | Temp 98.0°F | Resp 16 | Ht 63.0 in | Wt 168.2 lb

## 2022-07-18 DIAGNOSIS — R7303 Prediabetes: Secondary | ICD-10-CM | POA: Diagnosis not present

## 2022-07-18 DIAGNOSIS — E782 Mixed hyperlipidemia: Secondary | ICD-10-CM | POA: Diagnosis not present

## 2022-07-18 DIAGNOSIS — I10 Essential (primary) hypertension: Secondary | ICD-10-CM

## 2022-07-18 DIAGNOSIS — E559 Vitamin D deficiency, unspecified: Secondary | ICD-10-CM

## 2022-07-18 DIAGNOSIS — M810 Age-related osteoporosis without current pathological fracture: Secondary | ICD-10-CM

## 2022-07-18 DIAGNOSIS — F0390 Unspecified dementia without behavioral disturbance: Secondary | ICD-10-CM

## 2022-07-18 NOTE — Assessment & Plan Note (Signed)
Supplement and monitor 

## 2022-07-18 NOTE — Assessment & Plan Note (Signed)
Doing well at home with family support °

## 2022-07-18 NOTE — Assessment & Plan Note (Signed)
Tolerating Fosamax Encouraged to get adequate exercise, calcium and vitamin d intake °

## 2022-07-18 NOTE — Assessment & Plan Note (Signed)
Well controlled, no changes to meds. Encouraged heart healthy diet such as the DASH diet and exercise as tolerated.  °

## 2022-07-18 NOTE — Progress Notes (Unsigned)
Subjective:    Patient ID: Shannon [name], female    DOB: 1945-08-10, 77 y.o.   MRN: [id number]  No chief complaint on file.   HPI Discussed the use of AI scribe software for clinical note transcription with the patient, who gave verbal consent to proceed.  History of Present Illness   The patient presents with increasing frustration over their memory loss. They initially dismissed the issue, but have recently noticed more instances of forgetfulness, although they deny any significant lapses such as forgetting to turn off the stove or forgetting groceries. They are considering a new medication for memory loss, which they plan to discuss with their neurologist at an upcoming appointment.  The patient is also dealing with the logistics of selling multiple properties, including a mountain house and a house inherited from a deceased brother. They plan to sell one property per year to manage the tax implications. They are considering gifting some of the proceeds to family members to further reduce tax liability.  They currently consume caffeine-free diet soda, but are considering switching to regular sugar cola due to concerns about artificial sweeteners.        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 Echo 2012: EF 45-50%;  b. Lex MV 2/12:  low risk, apical defect (small area of ischemia vs shifting breast atten);  c.  Echo 7/12: Normal wall thickness, EF 60-65%, normal wall motion, grade 1 diastolic dysfunction, mild LAE, PASP 32;   d. Lex MV 11/13:  EF 76%, no ischemia   OA (osteoarthritis) 09/07/2011   Obesity 09/28/2014   Osteoporosis 03/07/2011   DEXA T score -2.6 AP spine 03/07/11    Formatting of this note might be different from the original. Formatting of this note might be different from the original. DEXA T score -2.6 AP spine 03/07/11   Last Assessment & Plan:  Formatting of this note might be different from the original. Encouraged to get adequate exercise, calcium and vitamin d intake   Pain in joint, ankle and  foot 09/28/2014   Palpitation 10/28/2020   Personal history of chemotherapy 2012   Personal history of radiation therapy 2012   Prediabetes 06/15/2009   Recurrent falls 12/28/2015   Right knee pain 07/10/2020   Shortness of breath    UTI (urinary tract infection) 03/03/2012   Vertebral fracture, osteoporotic, sequela 04/05/2016   Vitamin D deficiency 06/14/2017   Supplement and monitor    Past Surgical History:  Procedure Laterality Date   BREAST LUMPECTOMY  04/2009   RIGHT FOR BREAST CANCER-CHEMO/RADIATION X 1  YEAR   BREAST REDUCTION SURGERY Bilateral 05/04/2014   Procedure: MAMMARY REDUCTION  (BREAST);  Surgeon: Louisa Second, MD;  Location:  SURGERY CENTER;  Service: Plastics;  Laterality: Bilateral;   CARPAL TUNNEL RELEASE     L wrist, ulnar nerve moved   CHOLECYSTECTOMY     CRANIOTOMY FOR TUMOR  2000   ELBOW SURGERY     left   KNEE ARTHROSCOPY Left 10/12/2014   Procedure: LEFT KNEE ARTHROSCOPY ;  Surgeon: Ollen Gross, MD;  Location: [facility];  Service: Orthopedics;  Laterality: Left;   KYPHOPLASTY N/A 03/30/2016   Procedure: THORACIC 12 KYPHOPLASTY;  Surgeon: Estill Bamberg, MD;  Location: [facility];  Service: Orthopedics;  Laterality: N/A;  THORACIC 12 KYPHOPLASTY   LEFT HEART CATHETERIZATION WITH CORONARY ANGIOGRAM N/A 12/16/2013   Procedure: LEFT HEART CATHETERIZATION WITH CORONARY ANGIOGRAM;  Surgeon: Peter M Swaziland, MD;  Location: [facility];  Service: Cardiovascular;  Laterality: N/A;   LIPOMA EXCISION  03/28/2009   right leg   MELANOMA EXCISION     PORT-A-CATH REMOVAL  11/30/2010   Streck   porta cath     PORTACATH PLACEMENT  may 2011   REDUCTION MAMMAPLASTY Bilateral    SYNOVECTOMY Left 10/12/2014   Procedure: WITH SYNOVECTOMY;  Surgeon: Ollen Gross, MD;  Location: [facility];  Service: Orthopedics;  Laterality: Left;   TONSILLECTOMY  1958   TOTAL HIP ARTHROPLASTY Left 11/21/2021   Procedure: TOTAL HIP ARTHROPLASTY ANTERIOR APPROACH;  Surgeon: Samson Frederic, MD;  Location: [facility];  Service: Orthopedics;  Laterality: Left;   TOTAL KNEE ARTHROPLASTY  02/05/2012   Procedure: TOTAL KNEE ARTHROPLASTY;  Surgeon: Loanne Drilling, MD;  Location: [facility];  Service: Orthopedics;  Laterality: Right;   TOTAL KNEE ARTHROPLASTY Left 02/10/2013   Procedure: LEFT TOTAL KNEE ARTHROPLASTY;  Surgeon: Loanne Drilling, MD;  Location: [facility];  Service: Orthopedics;  Laterality: Left;   total knee raplacement  01-2012   Right Knee   TOTAL KNEE REVISION Left 07/14/2020   Procedure: TOTAL KNEE REVISION;   Surgeon: Ollen Gross, MD;  Location: [facility];  Service: Orthopedics;  Laterality: Left;   TUBAL LIGATION  1997    Family History  Problem Relation Age of Onset   Breast cancer Mother        sarcoma   Lung cancer Mother    Hypertension Mother    Prostate cancer Father    Congestive Heart Failure Father    Heart attack Father    Prostate cancer Brother    Heart disease Maternal Grandfather        MI   Down syndrome Son    CVA Son    Stomach cancer Maternal Aunt    Dementia Maternal Aunt    Uterine cancer Maternal Aunt    Colon cancer Neg Hx     Social History   Socioeconomic History   Marital status: Married    Spouse name: Zayneb Parilla   Number of children:  3   Years of education: 12   Highest education level: High school graduate  Occupational History   Occupation: boutique owner-retired    Employer: Producer, television/film/video  Tobacco Use   Smoking status: Never    Passive exposure: Never   Smokeless tobacco: Never  Vaping Use   Vaping Use: Never used  Substance and Sexual Activity   Alcohol use: No   Drug use: No   Sexual activity: Not Currently  Other Topics Concern   Not on file  Social History Narrative   Patient is right-handed. She lives with her husband in a 4 story home. They take care of their grown son with down syndrome who has had a stroke. She drinks 1-2 glasses of tea in the evenings. She does not formally exercise.      Social Determinants of Health   Financial Resource Strain: Low Risk  (02/15/2022)   Overall Financial Resource Strain (CARDIA)    Difficulty of Paying Living Expenses: Not hard at all  Food Insecurity: No Food Insecurity (02/15/2022)   Hunger Vital Sign    Worried About Running Out of Food in the Last Year: Never true    Ran Out of Food in the Last Year: Never true  Transportation Needs: No Transportation Needs (02/15/2022)   PRAPARE - Administrator, Civil Service (Medical): No    Lack of Transportation (Non-Medical): No   Physical Activity: Inactive (02/15/2022)   Exercise Vital Sign    Days of Exercise per Week: 0 days    Minutes of Exercise per Session: 0 min  Stress: Stress Concern Present (02/15/2022)   [employer] of Occupational Health - Occupational Stress Questionnaire    Feeling of Stress : To some extent  Social Connections: Moderately Integrated (02/15/2022)   Social Connection and Isolation Panel [NHANES]    Frequency of Communication with Friends and Family: More than three times a week    Frequency of Social Gatherings with Friends and Family: More than three times a week    Attends Religious Services: 1 to 4 times per year    Active Member of [employer] or Organizations: No    Attends Banker Meetings: Never    Marital Status: Married  Catering manager Violence: Not At Risk (02/15/2022)   Humiliation, Afraid, Rape, and Kick questionnaire    Fear of Current or Ex-Partner: No    Emotionally Abused: No    Physically Abused: No    Sexually Abused: No    Outpatient Medications Prior to Visit  Medication Sig Dispense Refill   acetaminophen (TYLENOL) 325 MG tablet Take 2 tablets (650 mg total) by mouth every 6 (six) hours as needed for mild pain or moderate pain (pain score 1-3 or temp > 100.5).     albuterol (VENTOLIN HFA) 108 (90 Base) MCG/ACT inhaler INHALE 1-2 PUFFS INTO THE LUNGS EVERY 6 (SIX) HOURS AS NEEDED FOR WHEEZING OR SHORTNESS OF BREATH. 18 each 3   alendronate (FOSAMAX) 70 MG tablet TAKE 1 TABLET (70 MG TOTAL) BY MOUTH EVERY 7 (SEVEN) DAYS. TAKE WITH A FULL GLASS OF WATER ON AN EMPTY STOMACH. 12 tablet 3   Calcium Carbonate (CALCIUM 600 PO) Take 1 tablet by mouth daily.     carvedilol (COREG) 25 MG tablet TAKE 1 TABLET TWICE DAILY WITH MEALS 120 tablet 5   cetirizine (ZYRTEC) 10 MG tablet Take 10 mg by mouth at bedtime.     cholecalciferol (VITAMIN D3) 25 MCG (1000 UNIT) tablet Take 1,000 Units by  mouth daily.     diltiazem (CARDIZEM CD) 120 MG 24 hr capsule TAKE 1  CAPSULE EVERY DAY 90 capsule 3   docusate sodium (COLACE) 100 MG capsule Take 1 capsule (100 mg total) by mouth 2 (two) times daily. (Patient taking differently: Take 100 mg by mouth 2 (two) times daily as needed.) 10 capsule 0   donepezil (ARICEPT) 10 MG tablet TAKE 1 TABLET AT BEDTIME 90 tablet 3   famotidine (PEPCID) 20 MG tablet TAKE 1 TABLET TWICE DAILY 180 tablet 3   ferrous sulfate 325 (65 FE) MG tablet Take 325 mg by mouth daily with breakfast.     folic acid (FOLVITE) 1 MG tablet Take 1 tablet (1 mg total) by mouth daily. 90 tablet 0   gabapentin (NEURONTIN) 300 MG capsule Take 1 capsule (300 mg total) by mouth 2 (two) times daily. 60 capsule 5   hydrOXYzine (ATARAX) 25 MG tablet TAKE 0.5-1 TABLETS (12.5-25 MG TOTAL) BY MOUTH 2 (TWO) TIMES DAILY AS NEEDED. (Patient taking differently: Take 25 mg by mouth 2 (two) times daily as needed (allergies).) 180 tablet 0   Multiple Vitamin (MULTIVITAMIN WITH MINERALS) TABS tablet Take 1 tablet by mouth daily.     omeprazole (PRILOSEC) 20 MG capsule TAKE 1 CAPSULE BY MOUTH EVERY DAY 90 capsule 1   pentosan polysulfate (ELMIRON) 100 MG capsule Take 200 mg by mouth in the morning and at bedtime.     polyethylene glycol (MIRALAX / GLYCOLAX) 17 g packet Take 17 g by mouth daily as needed for mild constipation. 14 each 0   rosuvastatin (CRESTOR) 20 MG tablet TAKE 1 TABLET AT BEDTIME (Patient taking differently: Take 20 mg by mouth at bedtime.) 90 tablet 3   venlafaxine (EFFEXOR) 75 MG tablet Take 225 mg by mouth in the morning.     No facility-administered medications prior to visit.    Allergies  Allergen Reactions   Dilaudid [Hydromorphone Hcl] Itching and Other (See Comments)    "Went Crazy"     Review of Systems  Constitutional:  Negative for fever and malaise/fatigue.  HENT:  Negative for congestion.   Eyes:  Negative for blurred vision.  Respiratory:  Negative for shortness of breath.   Cardiovascular:  Negative for chest pain,  palpitations and leg swelling.  Gastrointestinal:  Negative for abdominal pain, blood in stool and nausea.  Genitourinary:  Negative for dysuria and frequency.  Musculoskeletal:  Negative for falls.  Skin:  Negative for rash.  Neurological:  Negative for dizziness, loss of consciousness and headaches.  Endo/Heme/Allergies:  Negative for environmental allergies.  Psychiatric/Behavioral:  Positive for memory loss. Negative for depression. The patient is not nervous/anxious.        Objective:    Physical Exam Constitutional:      General: She is not in acute distress.    Appearance: Normal appearance. She is well-developed. She is not toxic-appearing.  HENT:     Head: Normocephalic and atraumatic.     Right Ear: External ear normal.     Left Ear: External ear normal.     Nose: Nose normal.  Eyes:     General:        Right eye: No discharge.        Left eye: No discharge.     Conjunctiva/sclera: Conjunctivae normal.  Neck:     Thyroid: No thyromegaly.  Cardiovascular:     Rate and Rhythm: Normal rate and regular rhythm.     Heart sounds: Normal heart sounds. No murmur  heard. Pulmonary:     Effort: Pulmonary effort is normal. No respiratory distress.     Breath sounds: Normal breath sounds.  Abdominal:     General: Bowel sounds are normal.     Palpations: Abdomen is soft.     Tenderness: There is no abdominal tenderness. There is no guarding.  Musculoskeletal:        General: Normal range of motion.     Cervical back: Neck supple.  Lymphadenopathy:     Cervical: No cervical adenopathy.  Skin:    General: Skin is warm and dry.  Neurological:     Mental Status: She is alert and oriented to person, place, and time.  Psychiatric:        Mood and Affect: Mood normal.        Behavior: Behavior normal.        Thought Content: Thought content normal.        Judgment: Judgment normal.    BP 132/78 (BP Location: Left Arm, Patient Position: Sitting, Cuff Size: Normal)   Pulse  68   Temp 98 F (36.7 C)   Resp 16   Ht 5\' 3"  (1.6 m)   Wt 168 lb 3.2 oz (76.3 kg)   LMP 01/30/1994   SpO2 95%   BMI 29.80 kg/m  Wt Readings from Last 3 Encounters:  07/18/22 168 lb 3.2 oz (76.3 kg)  05/30/22 169 lb 6.4 oz (76.8 kg)  02/15/22 152 lb (68.9 kg)    Diabetic Foot Exam - Simple   No data filed    Lab Results  Component Value Date   WBC 7.2 12/27/2021   HGB 10.8 (L) 12/27/2021   HCT 33.9 (L) 12/27/2021   PLT 291.0 12/27/2021   GLUCOSE 94 12/27/2021   CHOL 161 12/27/2021   TRIG 147.0 12/27/2021   HDL 56.50 12/27/2021   LDLDIRECT 89.0 06/23/2021   LDLCALC 75 12/27/2021   ALT 18 12/27/2021   AST 22 12/27/2021   NA 143 12/27/2021   K 4.3 12/27/2021   CL 103 12/27/2021   CREATININE 0.79 12/27/2021   BUN 15 12/27/2021   CO2 32 12/27/2021   TSH 3.32 12/27/2021   INR 1.1 11/29/2021   HGBA1C 4.9 12/27/2021    Lab Results  Component Value Date   TSH 3.32 12/27/2021   Lab Results  Component Value Date   WBC 7.2 12/27/2021   HGB 10.8 (L) 12/27/2021   HCT 33.9 (L) 12/27/2021   MCV 88.4 12/27/2021   PLT 291.0 12/27/2021   Lab Results  Component Value Date   NA 143 12/27/2021   K 4.3 12/27/2021   CHLORIDE 107 08/07/2016   CO2 32 12/27/2021   GLUCOSE 94 12/27/2021   BUN 15 12/27/2021   CREATININE 0.79 12/27/2021   BILITOT 0.4 12/27/2021   ALKPHOS 138 (H) 12/27/2021   AST 22 12/27/2021   ALT 18 12/27/2021   PROT 6.8 12/27/2021   ALBUMIN 4.2 12/27/2021   CALCIUM 9.7 12/27/2021   ANIONGAP 6 12/01/2021   EGFR 72 (L) 08/07/2016   GFR 72.08 12/27/2021   Lab Results  Component Value Date   CHOL 161 12/27/2021   Lab Results  Component Value Date   HDL 56.50 12/27/2021   Lab Results  Component Value Date   LDLCALC 75 12/27/2021   Lab Results  Component Value Date   TRIG 147.0 12/27/2021   Lab Results  Component Value Date   CHOLHDL 3 12/27/2021   Lab Results  Component Value Date  HGBA1C 4.9 12/27/2021       Assessment & Plan:   Mixed hyperlipidemia Assessment & Plan: Encourage heart healthy diet such as MIND or DASH diet, increase exercise, avoid trans fats, simple carbohydrates and processed foods, consider a krill or fish or flaxseed oil cap daily.  Tolerating Rosuvastatin  Orders: -     Lipid panel  Essential hypertension Assessment & Plan: Well controlled, no changes to meds. Encouraged heart healthy diet such as the DASH diet and exercise as tolerated.    Orders: -     Comprehensive metabolic panel -     CBC with Differential/Platelet -     TSH  Osteoporosis, unspecified osteoporosis type, unspecified pathological fracture presence Assessment & Plan: Tolerating Fosamax. Encouraged to get adequate exercise, calcium and vitamin d intake    Prediabetes Assessment & Plan: hgba1c acceptable, minimize simple carbs. Increase exercise as tolerated. Continue current meds   Orders: -     Hemoglobin A1c  Vitamin D deficiency Assessment & Plan: Supplement and monitor   Orders: -     VITAMIN D 25 Hydroxy (Vit-D Deficiency, Fractures)  Dementia, unspecified dementia severity, unspecified dementia type, unspecified whether behavioral, psychotic, or mood disturbance or anxiety (HCC) Assessment & Plan: Doing well at home with family support     Assessment and Plan    Cognitive Decline: Patient acknowledges increasing memory issues. Discussed the importance of lifestyle modifications including diet, exercise, and hydration in slowing cognitive decline. Patient is considering a new medication for memory and will discuss with neurologist. -Continue current multivitamin and fish oil regimen. -Encourage a diet rich in lean proteins, brown carbohydrates, and brightly colored fruits and vegetables. -Encourage regular exercise and adequate hydration (60-80 ounces of fluids daily). -Avoid processed meats and artificial sweeteners. -Discuss potential new memory medication with neurologist.  General Health  Maintenance: Discussed the importance of regular check-ups and lab work to monitor overall health. Discussed potential benefits of a new respiratory syncytial virus vaccine. -Order lab work today. -Consider respiratory syncytial virus vaccine at pharmacy. -Follow-up in three months (September 2024).         Danise Edge, MD

## 2022-07-18 NOTE — Assessment & Plan Note (Signed)
Encourage heart healthy diet such as MIND or DASH diet, increase exercise, avoid trans fats, simple carbohydrates and processed foods, consider a krill or fish or flaxseed oil cap daily. Tolerating Rosuvastatin 

## 2022-07-18 NOTE — Assessment & Plan Note (Signed)
hgba1c acceptable, minimize simple carbs. Increase exercise as tolerated. Continue current meds 

## 2022-07-18 NOTE — Patient Instructions (Signed)
RSV, Respiratory Syncitial Virus Vaccine vaccine, Arexvy  at pharmacy

## 2022-07-19 ENCOUNTER — Encounter: Payer: Self-pay | Admitting: Family Medicine

## 2022-07-19 LAB — CBC WITH DIFFERENTIAL/PLATELET
Basophils Absolute: 0.1 10*3/uL (ref 0.0–0.1)
Basophils Relative: 1 % (ref 0.0–3.0)
Eosinophils Absolute: 0.3 10*3/uL (ref 0.0–0.7)
Eosinophils Relative: 4.5 % (ref 0.0–5.0)
HCT: 37 % (ref 36.0–46.0)
Hemoglobin: 11.8 g/dL — ABNORMAL LOW (ref 12.0–15.0)
Lymphocytes Relative: 23 % (ref 12.0–46.0)
Lymphs Abs: 1.4 10*3/uL (ref 0.7–4.0)
MCHC: 31.8 g/dL (ref 30.0–36.0)
MCV: 87.5 fl (ref 78.0–100.0)
Monocytes Absolute: 0.5 10*3/uL (ref 0.1–1.0)
Monocytes Relative: 8.7 % (ref 3.0–12.0)
Neutro Abs: 3.8 10*3/uL (ref 1.4–7.7)
Neutrophils Relative %: 62.8 % (ref 43.0–77.0)
Platelets: 246 10*3/uL (ref 150.0–400.0)
RBC: 4.22 Mil/uL (ref 3.87–5.11)
RDW: 15.1 % (ref 11.5–15.5)
WBC: 6.1 10*3/uL (ref 4.0–10.5)

## 2022-07-19 LAB — LIPID PANEL
Cholesterol: 168 mg/dL (ref 0–200)
HDL: 47.2 mg/dL (ref >39.00)
LDL Cholesterol: 88 mg/dL (ref 0–99)
NonHDL: 121.26
Total CHOL/HDL Ratio: 4
Triglycerides: 168 mg/dL — ABNORMAL HIGH (ref 0.0–149.0)
VLDL: 33.6 mg/dL (ref 0.0–40.0)

## 2022-07-19 LAB — COMPREHENSIVE METABOLIC PANEL
ALT: 22 U/L (ref 0–35)
AST: 26 U/L (ref 0–37)
Albumin: 4 g/dL (ref 3.5–5.2)
Alkaline Phosphatase: 89 U/L (ref 39–117)
BUN: 12 mg/dL (ref 6–23)
CO2: 29 mEq/L (ref 19–32)
Calcium: 9.2 mg/dL (ref 8.4–10.5)
Chloride: 106 mEq/L (ref 96–112)
Creatinine, Ser: 0.84 mg/dL (ref 0.40–1.20)
GFR: 66.7 mL/min (ref >60.00)
Glucose, Bld: 105 mg/dL — ABNORMAL HIGH (ref 70–99)
Potassium: 4.1 mEq/L (ref 3.5–5.1)
Sodium: 142 mEq/L (ref 135–145)
Total Bilirubin: 0.4 mg/dL (ref 0.2–1.2)
Total Protein: 6.4 g/dL (ref 6.0–8.3)

## 2022-07-19 LAB — TSH: TSH: 2.75 u[IU]/mL (ref 0.35–5.50)

## 2022-07-19 LAB — VITAMIN D 25 HYDROXY (VIT D DEFICIENCY, FRACTURES): VITD: 34.29 ng/mL (ref 30.00–100.00)

## 2022-07-19 LAB — HEMOGLOBIN A1C: Hgb A1c MFr Bld: 5.5 % (ref 4.6–6.5)

## 2022-07-26 ENCOUNTER — Other Ambulatory Visit: Payer: Self-pay | Admitting: Cardiology

## 2022-07-26 DIAGNOSIS — R002 Palpitations: Secondary | ICD-10-CM

## 2022-07-31 ENCOUNTER — Encounter (HOSPITAL_BASED_OUTPATIENT_CLINIC_OR_DEPARTMENT_OTHER): Payer: Medicare Other | Attending: General Surgery | Admitting: Internal Medicine

## 2022-07-31 DIAGNOSIS — L309 Dermatitis, unspecified: Secondary | ICD-10-CM | POA: Insufficient documentation

## 2022-07-31 DIAGNOSIS — Z923 Personal history of irradiation: Secondary | ICD-10-CM | POA: Diagnosis not present

## 2022-07-31 DIAGNOSIS — C50311 Malignant neoplasm of lower-inner quadrant of right female breast: Secondary | ICD-10-CM

## 2022-07-31 DIAGNOSIS — Z853 Personal history of malignant neoplasm of breast: Secondary | ICD-10-CM | POA: Diagnosis not present

## 2022-07-31 NOTE — Progress Notes (Signed)
Shannon, [name] ([id number]) [id number] Page 1 of 6 Visit Report for 07/31/2022 Allergy List Details Patient Name: Date of Service: Shannon [name], Shannon [name] 07/31/2022 9:30 A M Medical Record Number: [id number] Patient Account Number: [id number] Date of Birth/Sex: Treating RN: 08-29-1945 (77 y.o. Shannon [name] Primary Care Teddie Curd: Danise Edge Other Clinician: Referring Lilibeth Opie: Treating Lavonne Cass/Extender: Lawana Chambers Weeks in Treatment: 0 Allergies Active Allergies Dilaudid Reaction: itching Allergy Notes Electronic Signature(s) Signed: 07/31/2022 4:27:23 PM By: Thayer Dallas Entered By: Thayer Dallas on 07/31/2022 09:53:27 -------------------------------------------------------------------------------- Arrival Information Details Patient Name: Date of Service: Shannon [name]. 07/31/2022 9:30 A M Medical Record Number: [id number] Patient Account Number: [id number] Date of Birth/Sex: Treating RN: [birth year] (77 y.o. F) Primary Care Dashanae Longfield: Danise Edge Other Clinician: Referring Rai Sinagra: Treating Antionne Enrique/Extender: Queen Slough in Treatment: 0 Visit Information Patient Arrived: Ambulatory Arrival Time: 09:46 Accompanied By: self Transfer Assistance: None Patient Identification Verified: Yes Secondary Verification Process Completed: Yes Patient Requires Transmission-Based Precautions: No Patient Has Alerts: Yes Patient Alerts: Patient on Blood Thinner History Since Last Visit Electronic Signature(s) Signed: 07/31/2022 4:27:23 PM By: Thayer Dallas Entered By: Thayer Dallas on 07/31/2022 10:30:37 Clinic Level of Care Assessment Details -------------------------------------------------------------------------------- Shannon [name] ([id number]) [id number] Page 2 of 6 Patient Name: Date of Service: Shannon, [name] 07/31/2022 9:30 A M Medical Record Number: [id number] Patient Account  Number: [id number] Date of Birth/Sex: Treating RN: [birth year] (77 y.o. Shannon [name] Primary Care Abigal Choung: Danise Edge Other Clinician: Referring Devani Odonnel: Treating Derrel Moore/Extender: Lawana Chambers Weeks in Treatment: 0 Clinic Level of Care Assessment Items TOOL 2 Quantity Score X- 1 0 Use when only an EandM is performed on the INITIAL visit ASSESSMENTS - Nursing Assessment / Reassessment X- 1 20 General Physical Exam (combine w/ comprehensive assessment (listed just below) when performed on new pt. evals) X- 1 25 Comprehensive Assessment (HX, ROS, Risk Assessments, Wounds Hx, etc.) ASSESSMENTS - Wound and Skin A ssessment / Reassessment []  - 0 Simple Wound Assessment / Reassessment - one wound []  - 0 Complex Wound Assessment / Reassessment - multiple wounds X- 1 10 Dermatologic / Skin Assessment (not related to wound area) ASSESSMENTS - Ostomy and/or Continence Assessment and Care []  - 0 Incontinence Assessment and Management []  - 0 Ostomy Care Assessment and Management (repouching, etc.) PROCESS - Coordination of Care X - Simple Patient / Family Education for ongoing care 1 15 []  - 0 Complex (extensive) Patient / Family Education for ongoing care X- 1 10 Staff obtains Chiropractor, Records, T Results / Process Orders est []  - 0 Staff telephones HHA, Nursing Homes / Clarify orders / etc []  - 0 Routine Transfer to another Facility (non-emergent condition) []  - 0 Routine Hospital Admission (non-emergent condition) X- 1 15 New Admissions / Manufacturing engineer / Ordering NPWT Apligraf, etc. , []  - 0 Emergency Hospital Admission (emergent condition) X- 1 10 Simple Discharge Coordination []  - 0 Complex (extensive) Discharge Coordination PROCESS - Special Needs []  - 0 Pediatric / Minor Patient Management []  - 0 Isolation Patient Management []  - 0 Hearing / Language / Visual special needs []  - 0 Assessment of Community assistance  (transportation, [name]/C planning, etc.) []  - 0 Additional assistance / Altered mentation []  - 0 Support Surface(s) Assessment (bed, cushion, seat, etc.) INTERVENTIONS - Wound Cleansing / Measurement []  - 0 Wound Imaging (photographs - any number of wounds) []  - 0 Wound Tracing (instead of photographs) []  - 0 Simple Wound Measurement - one wound []  -  0 Complex Wound Measurement - multiple wounds []  - 0 Simple Wound Cleansing - one wound []  - 0 Complex Wound Cleansing - multiple wounds INTERVENTIONS - Wound Dressings []  - 0 Small Wound Dressing one or multiple wounds []  - 0 Medium Wound Dressing one or multiple wounds []  - 0 Large Wound Dressing one or multiple wounds Shannon [name], Shannon [name] ([id number]) [id number] Page 3 of 6 []  - 0 Application of Medications - injection INTERVENTIONS - Miscellaneous []  - 0 External ear exam []  - 0 Specimen Collection (cultures, biopsies, blood, body fluids, etc.) []  - 0 Specimen(s) / Culture(s) sent or taken to Lab for analysis []  - 0 Patient Transfer (multiple staff / Michiel Sites Lift / Similar devices) []  - 0 Simple Staple / Suture removal (25 or less) []  - 0 Complex Staple / Suture removal (26 or more) []  - 0 Hypo / Hyperglycemic Management (close monitor of Blood Glucose) []  - 0 Ankle / Brachial Index (ABI) - do not check if billed separately Has the patient been seen at the hospital within the last three years: Yes Total Score: 105 Level Of Care: New/Established - Level 3 Electronic Signature(s) Signed: 07/31/2022 4:45:04 PM By: Shawn Stall RN, BSN Entered By: Shawn Stall on 07/31/2022 10:32:44 -------------------------------------------------------------------------------- Encounter Discharge Information Details Patient Name: Date of Service: Shannon [name]. 07/31/2022 9:30 A M Medical Record Number: [id number] Patient Account Number: [id number] Date of Birth/Sex: Treating RN: December 26, 1945 (77 y.o. Shannon [name] Primary Care Azka Steger: Danise Edge Other Clinician: Referring Dezmon Conover: Treating Diondre Pulis/Extender: Queen Slough in Treatment: 0 Encounter Discharge Information Items Discharge Condition: Stable Ambulatory Status: Ambulatory Discharge Destination: Home Transportation: Private Auto Accompanied By: self Schedule Follow-up Appointment: No Clinical Summary of Care: Electronic Signature(s) Signed: 07/31/2022 4:45:04 PM By: Shawn Stall RN, BSN Entered By: Shawn Stall on 07/31/2022 10:34:53 -------------------------------------------------------------------------------- Lower Extremity Assessment Details Patient Name: Date of Service: Shannon [name]. 07/31/2022 9:30 A M Medical Record Number: [id number] Patient Account Number: [id number] Date of Birth/Sex: Treating RN: 1945-06-26 (77 y.o. F) Primary Care Johnluke Haugen: Danise Edge Other Clinician: Referring Kaylyne Axton: Treating Chelan Heringer/Extender: Queen Slough in Treatment: [address] Shannon [name], Shannon [name] ([id number]) [id number] Page 4 of 6 Signed: 07/31/2022 4:27:23 PM By: Thayer Dallas Entered By: Thayer Dallas on 07/31/2022 10:05:19 -------------------------------------------------------------------------------- Multi Wound Chart Details Patient Name: Date of Service: Shannon [name]. 07/31/2022 9:30 A M Medical Record Number: [id number] Patient Account Number: [id number] Date of Birth/Sex: Treating RN: 1945-02-14 (77 y.o. F) Primary Care Sosaia Pittinger: Danise Edge Other Clinician: Referring Cinnamon Morency: Treating Nyellie Yetter/Extender: Lawana Chambers Weeks in Treatment: 0 Vital Signs Height(in): 64 Pulse(bpm): 64 Weight(lbs): 150 Blood Pressure(mmHg): 120/77 Body Mass Index(BMI): 25.7 Temperature(F): 98 Respiratory Rate(breaths/min): 18 [Treatment Notes:Wound Assessments Treatment Notes] Electronic Signature(s) Signed: 07/31/2022  2:53:49 PM By: Geralyn Corwin DO Entered By: Geralyn Corwin on 07/31/2022 11:12:44 -------------------------------------------------------------------------------- Multi-Disciplinary Care Plan Details Patient Name: Date of Service: Shannon [name]. 07/31/2022 9:30 A M Medical Record Number: [id number] Patient Account Number: [id number] Date of Birth/Sex: Treating RN: 09-22-1945 (77 y.o. Shannon [name] Primary Care Evamaria Detore: Danise Edge Other Clinician: Referring Jaydn Moscato: Treating Danayah Smyre/Extender: Lawana Chambers Weeks in Treatment: 0 Active Inactive Electronic Signature(s) Signed: 07/31/2022 4:45:04 PM By: Shawn Stall RN, BSN Entered By: Shawn Stall on 07/31/2022 10:31:45 -------------------------------------------------------------------------------- Pain Assessment Details Patient Name: Date of Service: Shannon [name]. 07/31/2022 9:30 A M Medical Record Number: [id number] Patient Account Number: [id number] Date of Birth/Sex: Treating RN: Oct 19, 1945 (77 y.o.  Shannon [name] Primary Care Parnell Spieler: Danise Edge Other Clinician: Astrid Drafts [name] ([id number]) [id number] Page 5 of 6 Referring Eretria Manternach: Treating Liberta Gimpel/Extender: Queen Slough in Treatment: 0 Active Problems Location of Pain Severity and Description of Pain Patient Has Paino No Site Locations Pain Management and Medication Current Pain Management: Electronic Signature(s) Signed: 07/31/2022 4:45:04 PM By: Shawn Stall RN, BSN Entered By: Shawn Stall on 07/31/2022 10:35:20 -------------------------------------------------------------------------------- Patient/Caregiver Education Details Patient Name: Date of Service: Shannon [name] 7/1/2024andnbsp9:30 A M Medical Record Number: [id number] Patient Account Number: [id number] Date of Birth/Gender: Treating RN: May 11, 1945 (77 y.o. Debara Pickett, Yvonne Kendall Primary Care Physician: Danise Edge Other  Clinician: Referring Physician: Treating Physician/Extender: Queen Slough in Treatment: 0 Education Assessment Education Provided To: Patient Education Topics Provided Welcome [facility]New Patient Packet: o Handouts: Welcome T [facility] o Methods: Explain/Verbal Responses: Reinforcements needed Electronic Signature(s) Signed: 07/31/2022 4:45:04 PM By: Shawn Stall RN, BSN Entered By: Shawn Stall on 07/31/2022 10:31:58 Shannon [name] ([id number]) [id number] Page 6 of 6 -------------------------------------------------------------------------------- Vitals Details Patient Name: Date of Service: Shannon [name], Shannon [name] 07/31/2022 9:30 A M Medical Record Number: [id number] Patient Account Number: [id number] Date of Birth/Sex: Treating RN: December 13, 1945 (77 y.o. F) Primary Care Keyasia Jolliff: Danise Edge Other Clinician: Referring Shamyah Stantz: Treating Giovanne Nickolson/Extender: Lawana Chambers Weeks in Treatment: 0 Vital Signs Time Taken: 09:50 Temperature (F): 98 Height (in): 64 Pulse (bpm): 64 Source: Stated Respiratory Rate (breaths/min): 18 Weight (lbs): 150 Blood Pressure (mmHg): 120/77 Source: Stated Reference Range: 80 - 120 mg / dl Body Mass Index (BMI): 25.7 Electronic Signature(s) Signed: 07/31/2022 4:27:23 PM By: Thayer Dallas Entered By: Thayer Dallas on 07/31/2022 09:52:45

## 2022-07-31 NOTE — Progress Notes (Signed)
Shannon [name] ([id number]) [id number] Page 1 of 8 Visit Report for 07/31/2022 Chief Complaint Document Details Patient Name: Date of Service: Shannon [name], Shannon [name] 07/31/2022 9:30 A M Medical Record Number: [id number] Patient Account Number: [id number] Date of Birth/Sex: Treating RN: Jul 21, 1945 (77 y.o. F) Primary Care Provider: Danise Edge Other Clinician: Referring Provider: Treating Provider/Extender: Lawana Chambers Weeks in Treatment: 0 Information Obtained from: Patient Chief Complaint Right Breast Ulcer Electronic Signature(s) Signed: 07/31/2022 2:53:49 PM By: Geralyn Corwin DO Entered By: Geralyn Corwin on 07/31/2022 11:12:58 -------------------------------------------------------------------------------- HPI Details Patient Name: Date of Service: Shannon Lee D. 07/31/2022 9:30 A M Medical Record Number: [id number] Patient Account Number: [id number] Date of Birth/Sex: Treating RN: [birth year] (77 y.o. F) Primary Care Provider: Danise Edge Other Clinician: Referring Provider: Treating Provider/Extender: Queen Slough in Treatment: 0 History of Present Illness HPI Description: 04/14/2020 on evaluation today patient appears to be doing somewhat poorly in regard to the wound that is noted just inferior to her breast on the right side. She has previously had cancer where she did have an area removed with some mild treatment as far as radiation is concerned at the site. With that being said she does have a little bit of scar tissue here as well which fortunately is not too severe but nonetheless I think has led to this wound opening. Has been coming and going and she is concerned obviously that it will just go away. Fortunately there is not appear to be any signs of infection which is great news. She has been using DuoDERM on this but states it really is not staying well based on the location it tends to roll up and come  off. Nonetheless she does not seem to have any major medical problems otherwise. Although again she does have a fairly strong past medical history for cancer. The good news is the wound does not appear to be too significant based on the overall appearance today but again I think we may be able to find a better dressing and the DuoDERM to help this out. 04/21/2020 on evaluation today patient actually appears to be doing excellent in regard to her wound. In fact this is very tiny almost appear to be completely healed but I do believe she still has just a slight opening at this point. 04/28/2020 upon evaluation today patient appears to be doing well with regard to her wounds. She has been tolerating the dressing changes without complication. Fortunately there is no signs of active infection at this time which is great news. In fact I feel like this is completely healed based on what I see today. 07/31/2022 Shannon [name] is a 77 year old female with a past medical history of right-sided breast cancer with bilateral breast reduction in 2016 that presents to the clinic for skin irritation to the right lower breast. She states that 3 months ago she had a mammogram and after the mammogram she developed some skin breakdown. She has been using silver alginate to the area and there is no open wound today. Electronic Signature(s) Signed: 07/31/2022 2:53:49 PM By: Geralyn Corwin DO Entered By: Geralyn Corwin on 07/31/2022 11:14:22 [name] ([id number]) [id number] Page 2 of 8 -------------------------------------------------------------------------------- Physical Exam Details Patient Name: Date of Service: Shannon [name], Shannon [name] 07/31/2022 9:30 A M Medical Record Number: [id number] Patient Account Number: [id number] Date of Birth/Sex: Treating RN: Sep 23, 1945 (77 y.o. F) Primary Care Provider: Danise Edge Other Clinician: Referring Provider: Treating Provider/Extender: Angela Burke,  Stacey Weeks in Treatment: 0 Constitutional respirations regular, non-labored and within target range for patient.Marland Kitchen Psychiatric pleasant and cooperative. Notes T the underside of the right breast there is mild irritation without any open wounds or skin breakdown. No signs of infection including fungal o Electronic Signature(s) Signed: 07/31/2022 2:53:49 PM By: Geralyn Corwin DO Entered By: Geralyn Corwin on 07/31/2022 11:15:29 -------------------------------------------------------------------------------- Physician Orders Details Patient Name: Date of Service: Shannon Lee D. 07/31/2022 9:30 A M Medical Record Number: [id number] Patient Account Number: [id number] Date of Birth/Sex: Treating RN: 10/07/1945 (77 y.o. Arta Silence Primary Care Provider: Danise Edge Other Clinician: Referring Provider: Treating Provider/Extender: Queen Slough in Treatment: 0 Verbal / Phone Orders: No Diagnosis Coding ICD-10 Coding Code Description L30.9 Dermatitis, unspecified Discharge From [facility] Discharge from [facility] - Call if any future wound care needs. Apply a thin area over the counter bacitracin antibiotic ointment to area under right breast for protection x1 week. Ensure nothing rubs area and keep dry. Ensure not too much moisture under the breasts. Electronic Signature(s) Signed: 07/31/2022 2:53:49 PM By: Geralyn Corwin DO Entered By: Geralyn Corwin on 07/31/2022 11:15:34 -------------------------------------------------------------------------------- Problem List Details Patient Name: Date of Service: Shannon Lee D. 07/31/2022 9:30 A M Medical Record Number: [id number] Patient Account Number: [id number] Shannon [name], Shannon [name] ([id number]) [phone].pdf Page 3 of 8 Date of Birth/Sex: Treating RN: 03/31/1945 (77 y.o. 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 07/31/2022 9:30 A M Medical Record Number: [id number] Patient Account Number: [id number] Date of Birth/Sex: Treating RN: 03/03/1945 (77 y.o. F) Primary Care Provider: Danise Edge Other Clinician: Referring Provider: Treating Provider/Extender: Lawana Chambers Weeks in Treatment: 0 Subjective Chief Complaint Information obtained from Patient Right Breast Ulcer History of Present Illness (HPI) 04/14/2020 on evaluation today patient appears to be doing somewhat poorly in regard to the wound that is noted just inferior to her breast on the right side. She has previously had cancer where she did have an area removed with some mild treatment as far as radiation is concerned at the site. With that being said she does have a little bit of scar tissue here as well which fortunately is not too severe but nonetheless I think has led to this wound opening. Has been coming and going and she is concerned obviously that it will just go away. Fortunately there is not appear to be any signs of infection which is great news. She has been using DuoDERM on this but states it really is not staying well based on the location it tends to roll up and come off. Nonetheless she does not seem to have any major medical problems otherwise. Although again she does have a fairly strong past medical history for  cancer. The good news is the wound does not appear to be too significant based on the overall appearance today but again I think we may be able to find a better dressing and the DuoDERM to help this out. 04/21/2020 on evaluation today patient actually appears to be doing excellent in  regard to her wound. In fact this is very tiny almost appear to be completely healed but I do believe she still has just a slight opening at this point. 04/28/2020 upon evaluation today patient appears to be doing well with regard to her wounds. She has been tolerating the dressing changes without complication. Fortunately there is no signs of active infection at this time which is great news. In fact I feel like this is completely healed based on what I see today. 07/31/2022 Shannon [name] is a 77 year old female with a past medical history of right-sided breast cancer with bilateral breast reduction in 2016 that presents to the clinic for skin irritation to the right lower breast. She states that 3 months ago she had a mammogram and after the mammogram she developed some skin breakdown. She has been using silver alginate to the area and there is no open wound today. Patient History Information obtained from Patient. Allergies Dilaudid (Reaction: itching) Shannon [name], Shannon [name] ([id number]) [id number] Page 4 of 8 Family History Cancer - Mother,Father,Siblings, Heart Disease - Father, Hypertension - Mother, Stroke - Child, No family history of Diabetes, Kidney Disease, Lung Disease, Seizures, Thyroid Problems, Tuberculosis. Social History Never smoker, Marital Status - Married, Alcohol Use - Never, Drug Use - No History, Caffeine Use - Never. Medical History Eyes Patient has history of Cataracts - removed Denies history of Glaucoma, Optic Neuritis Ear/Nose/Mouth/Throat Denies history of Chronic sinus problems/congestion, Middle ear problems Hematologic/Lymphatic Denies history of Anemia,  Hemophilia, Human Immunodeficiency Virus, Lymphedema, Sickle Cell Disease Respiratory Patient has history of Asthma, Sleep Apnea Denies history of Aspiration, Chronic Obstructive Pulmonary Disease (COPD), Pneumothorax, Tuberculosis Cardiovascular Patient has history of Hypertension Denies history of Angina, Arrhythmia, Congestive Heart Failure, Coronary Artery Disease, Deep Vein Thrombosis, Hypotension, Myocardial Infarction, Peripheral Arterial Disease, Peripheral Venous Disease, Phlebitis, Vasculitis Gastrointestinal Denies history of Cirrhosis , Colitis, Crohns, Hepatitis A, Hepatitis B, Hepatitis C Endocrine Denies history of Type I Diabetes, Type II Diabetes Genitourinary Denies history of End Stage Renal Disease Immunological Denies history of Lupus Erythematosus, Raynauds, Scleroderma Integumentary (Skin) Denies history of History of Burn Musculoskeletal Denies history of Gout, Rheumatoid Arthritis, Osteoarthritis, Osteomyelitis Neurologic Patient has history of Dementia - neurocognitive disorder, Neuropathy Denies history of Quadriplegia, Paraplegia, Seizure Disorder Psychiatric Denies history of Anorexia/bulimia, Confinement Anxiety Hospitalization/Surgery History - left hip replacement 2023. - 2022 left knee revision. - bilateral breast reduction surgery 05/04/2014. - Knee replacement x 3. - brain tumor removed. - lumpectomy. Medical A Surgical History Notes nd Cardiovascular Cardiomyopathy Genitourinary Interstitial cystitis Oncologic Right breast cancer s/p lumpectomy 10 years ago, brain cancer 15 years ago, Melanoma 2008 Review of Systems (ROS) Constitutional Symptoms (General Health) Denies complaints or symptoms of Fatigue, Fever, Chills, Marked Weight Change. Eyes Complains or has symptoms of Glasses / Contacts - readers. Denies complaints or symptoms of Dry Eyes, Vision Changes. Ear/Nose/Mouth/Throat Denies complaints or symptoms of Chronic sinus problems or  rhinitis. Respiratory Denies complaints or symptoms of Chronic or frequent coughs, Shortness of Breath. Gastrointestinal Denies complaints or symptoms of Frequent diarrhea, Nausea, Vomiting. Endocrine Denies complaints or symptoms of Heat/cold intolerance. Genitourinary Denies complaints or symptoms of Frequent urination. Integumentary (Skin) Complains or has symptoms of Wounds - under right breast. Musculoskeletal Denies complaints or symptoms of Muscle Pain, Muscle Weakness. Psychiatric Denies complaints or symptoms of Claustrophobia. Objective Constitutional respirations regular, non-labored and within target range for patient.. Vitals Time Taken: 9:50 AM, Height: 64 in, Source: Stated, Weight: 150 lbs, Source: Stated, BMI: 25.7, Temperature: 98 F,  Pulse: 64 bpm, Respiratory Rate: 18 breaths/min, Blood Pressure: 120/77 mmHg. Shannon [name], Shannon [name] ([id number]) [id number] Page 5 of 8 Psychiatric pleasant and cooperative. General Notes: T the underside of the right breast there is mild irritation without any open wounds or skin breakdown. No signs of infection including fungal o Assessment Active Problems ICD-10 Dermatitis, unspecified Malignant neoplasm of lower-inner quadrant of right female breast Patient states she has a history of irritation to the right breast with skin breakdown at times. She has no open wounds today. She did use silver alginate which resolved her issue. I recommended a thin layer of antibiotic ointment daily for the next week to assure that this area does not dry and crack. She does not wear a bra and uses camisoles. I recommended she keep this area dry and without moisture to prevent future skin irritation and skin breakdown.She knows to call with any questions or concerns. Plan Discharge From [facility] Services: Discharge from Wound Care Center - Call if any future wound care needs. Apply a thin area over the counter bacitracin antibiotic  ointment to area under right breast for protection x1 week. Ensure nothing rubs area and keep dry. Ensure not too much moisture under the breasts. 1. Follow-up as needed Electronic Signature(s) Signed: 07/31/2022 2:53:49 PM By: Geralyn Corwin DO Entered By: Geralyn Corwin on 07/31/2022 11:18:47 -------------------------------------------------------------------------------- HxROS Details Patient Name: Date of Service: Shannon Lee D. 07/31/2022 9:30 A M Medical Record Number: [id number] Patient Account Number: [id number] Date of Birth/Sex: Treating RN: 1945-06-21 (77 y.o. Arta Silence Primary Care Provider: Danise Edge Other Clinician: Referring Provider: Treating Provider/Extender: Lawana Chambers Weeks in Treatment: 0 Information Obtained From Patient Constitutional Symptoms (General Health) Complaints and Symptoms: Negative for: Fatigue; Fever; Chills; Marked Weight Change Eyes Complaints and Symptoms: Positive for: Glasses / Contacts - readers Negative for: Dry Eyes; Vision Changes Medical History: Positive for: Cataracts - removed Negative for: Glaucoma; Optic Neuritis Ear/Nose/Mouth/Throat Shannon [name], Shannon [name] ([id number]) [id number] Page 6 of 8 Complaints and Symptoms: Negative for: Chronic sinus problems or rhinitis Medical History: Negative for: Chronic sinus problems/congestion; Middle ear problems Respiratory Complaints and Symptoms: Negative for: Chronic or frequent coughs; Shortness of Breath Medical History: Positive for: Asthma; Sleep Apnea Negative for: Aspiration; Chronic Obstructive Pulmonary Disease (COPD); Pneumothorax; Tuberculosis Gastrointestinal Complaints and Symptoms: Negative for: Frequent diarrhea; Nausea; Vomiting Medical History: Negative for: Cirrhosis ; Colitis; Crohns; Hepatitis A; Hepatitis B; Hepatitis C Endocrine Complaints and Symptoms: Negative for: Heat/cold intolerance Medical  History: Negative for: Type I Diabetes; Type II Diabetes Genitourinary Complaints and Symptoms: Negative for: Frequent urination Medical History: Negative for: End Stage Renal Disease Past Medical History Notes: Interstitial cystitis Integumentary (Skin) Complaints and Symptoms: Positive for: Wounds - under right breast Medical History: Negative for: History of Burn Musculoskeletal Complaints and Symptoms: Negative for: Muscle Pain; Muscle Weakness Medical History: Negative for: Gout; Rheumatoid Arthritis; Osteoarthritis; Osteomyelitis Psychiatric Complaints and Symptoms: Negative for: Claustrophobia Medical History: Negative for: Anorexia/bulimia; Confinement Anxiety Hematologic/Lymphatic Medical History: Negative for: Anemia; Hemophilia; Human Immunodeficiency Virus; Lymphedema; Sickle Cell Disease Cardiovascular Medical History: Positive for: Hypertension Negative for: Angina; Arrhythmia; Congestive Heart Failure; Coronary Artery Disease; Deep Vein Thrombosis; Hypotension; Myocardial Infarction; Peripheral Arterial Disease; Peripheral Venous Disease; Phlebitis; Vasculitis Past Medical History Notes: Cardiomyopathy Immunological Medical History: Negative for: Lupus Erythematosus; Raynauds; Scleroderma Shannon [name], Shannon [name] ([id number]) [id number] Page 7 of 8 Neurologic Medical History: Positive for: Dementia - neurocognitive disorder; Neuropathy Negative for: Quadriplegia; Paraplegia; Seizure Disorder Oncologic Medical History: Past Medical History Notes: Right  breast cancer s/p lumpectomy 10 years ago, brain cancer 15 years ago, Melanoma 2008 [facility] Extended History Items Eyes: Cataracts Immunizations Pneumococcal Vaccine: Received Pneumococcal Vaccination: No Implantable Devices None Hospitalization / Surgery History Type of Hospitalization/Surgery left hip replacement 2023 2022 left knee revision bilateral breast reduction surgery  05/04/2014 Knee replacement x 3 brain tumor removed lumpectomy Family and Social History Cancer: Yes - Mother,Father,Siblings; Diabetes: No; Heart Disease: Yes - Father; Hypertension: Yes - Mother; Kidney Disease: No; Lung Disease: No; Seizures: No; Stroke: Yes - Child; Thyroid Problems: No; Tuberculosis: No; Never smoker; Marital Status - Married; Alcohol Use: Never; Drug Use: No History; Caffeine Use: Never; Financial Concerns: No; Food, Clothing or Shelter Needs: No; Support System Lacking: No; Transportation Concerns: No Electronic Signature(s) Signed: 07/31/2022 10:26:47 AM By: Geralyn Corwin DO Signed: 07/31/2022 4:27:23 PM By: Thayer Dallas Signed: 07/31/2022 4:45:04 PM By: Shawn Stall RN, BSN Entered By: Thayer Dallas on 07/31/2022 10:02:15 -------------------------------------------------------------------------------- SuperBill Details Patient Name: Date of Service: Shannon Lee D. 07/31/2022 Medical Record Number: [id number] Patient Account Number: [id number] Date of Birth/Sex: Treating RN: 08-Mar-1945 (77 y.o. Arta Silence Primary Care Provider: Danise Edge Other Clinician: Referring Provider: Treating Provider/Extender: Lawana Chambers Weeks in Treatment: 0 Diagnosis Coding ICD-10 Codes Code Description L30.9 Dermatitis, unspecified C50.311 Malignant neoplasm of lower-inner quadrant of right female breast Facility Procedures : [facility]LINE, CASTREJON Code: [id number] [facility] ([id number]) Description: [phone] - WOUND CARE VISIT-LEV 3 EST PT [phone] Modifier: [id number] Quantity: 1 27.pdf Page 8 of 8 Physician Procedures : CPT4 Code Description Modifier [id number] [phone] - WC PHYS LEVEL 3 - EST PT ICD-10 Diagnosis Description L30.9 Dermatitis, unspecified C50.311 Malignant neoplasm of lower-inner quadrant of right female breast Quantity: 1 Electronic Signature(s) Signed: 07/31/2022 2:53:49 PM By: Geralyn Corwin DO Entered By: Geralyn Corwin on  07/31/2022 11:19:07

## 2022-07-31 NOTE — Progress Notes (Signed)
MAKAIYLA, UMHOEFER ([id number]) [phone] [id number] Page 1 of 4 Visit Report for 07/31/2022 Abuse Risk Screen Details Patient Name: Date of Service: Shannon [name], Shannon [name] 07/31/2022 9:30 A M Medical Record Number: [id number] Patient Account Number: [id number] Date of Birth/Sex: Treating RN: [birth year] (77 y.o. F) Primary Care Demontay Grantham: Danise Edge Other Clinician: Referring Rogue Pautler: Treating Sholom Dulude/Extender: Lawana Chambers Weeks in Treatment: 0 Abuse Risk Screen Items Answer ABUSE RISK SCREEN: Has anyone close to you tried to hurt or harm you recentlyo No Do you feel uncomfortable with anyone in your familyo No Has anyone forced you do things that you didnt want to doo No Electronic Signature(s) Signed: 07/31/2022 4:27:23 PM By: Thayer Dallas Entered By: Thayer Dallas on 07/31/2022 10:02:32 -------------------------------------------------------------------------------- Activities of Daily Living Details Patient Name: Date of Service: Shannon [name], Shannon [name] 07/31/2022 9:30 A M Medical Record Number: [id number] Patient Account Number: [id number] Date of Birth/Sex: Treating RN: September 09, 1945 (77 y.o. F) Primary Care Cejay Cambre: Danise Edge Other Clinician: Referring Ayinde Swim: Treating Adrieanna Boteler/Extender: Lawana Chambers Weeks in Treatment: 0 Activities of Daily Living Items Answer Activities of Daily Living (Please select one for each item) Drive Automobile Completely Able T Medications ake Completely Able Use T elephone Completely Able Care for Appearance Completely Able Use T oilet Completely Able Bath / Shower Completely Able Dress Self Completely Able Feed Self Completely Able Walk Completely Able Get In / Out Bed Completely Able Housework Completely Able Prepare Meals Completely Able Handle Money Completely Able Shop for Self Completely Able Electronic Signature(s) Signed: 07/31/2022 4:27:23 PM By: Thayer Dallas Entered By: Thayer Dallas on 07/31/2022 10:02:56 Astrid Drafts [name] ([id number]) [id number] Page 2 of 4 -------------------------------------------------------------------------------- Education Screening Details Patient Name: Date of Service: Shannon [name], Shannon [name] 07/31/2022 9:30 A M Medical Record Number: [id number] Patient Account Number: [id number] Date of Birth/Sex: Treating RN: [birth year] (77 y.o. F) Primary Care Chayse Zatarain: Danise Edge Other Clinician: Referring Arrabella Westerman: Treating Hallee Mckenny/Extender: Queen [place] in Treatment: 0 Primary Learner Assessed: Patient Learning Preferences/Education Level/Primary Language Learning Preference: Explanation, Demonstration, Printed Material Highest Education Level: High School Preferred Language: English Cognitive Barrier Language Barrier: No Translator Needed: No Memory Deficit: No Emotional Barrier: No Cultural/Religious Beliefs Affecting Medical Care: No Physical Barrier Impaired Vision: Yes readers Impaired Hearing: No Decreased Hand dexterity: No Knowledge/Comprehension Knowledge Level: High Comprehension Level: High Ability to understand written instructions: High Ability to understand verbal instructions: High Motivation Anxiety Level: Calm Cooperation: Cooperative Education Importance: Acknowledges Need Interest in Health Problems: Asks Questions Perception: Coherent Willingness to Engage in Self-Management High Activities: Readiness to Engage in Self-Management High Activities: Electronic Signature(s) Signed: 07/31/2022 4:27:23 PM By: Thayer Dallas Entered By: Thayer Dallas on 07/31/2022 10:03:41 -------------------------------------------------------------------------------- Fall Risk Assessment Details Patient Name: Date of Service: Shannon Lee [name]. 07/31/2022 9:30 A M Medical Record Number: [id number] Patient Account Number: [id number] Date of Birth/Sex: Treating RN: 07/13/1945 (77  y.o. F) Primary Care Alexie Samson: Danise Edge Other Clinician: Referring Kameshia Madruga: Treating Dreamer Carillo/Extender: Lawana Chambers Weeks in Treatment: 0 Fall Risk Assessment Items Have you had 2 or more falls in the last [address] AKARI, Shannon [name] ([id number]) [phone] [id number] Page 3 of 4 Have you had any fall that resulted in injury in the last 12 monthso 0 No FALLS RISK SCREEN History of falling - immediate or within 3 months 0 No Secondary diagnosis (Do you have 2 or more medical diagnoseso) 0 No Ambulatory aid None/bed rest/wheelchair/nurse 0 Yes Crutches/cane/walker 0 No Furniture 0 No  Intravenous therapy Access/Saline/Heparin Lock 0 No Gait/Transferring Normal/ bed rest/ wheelchair 0 Yes Weak (short steps with or without shuffle, stooped but able to lift head while walking, may seek 0 No support from furniture) Impaired (short steps with shuffle, may have difficulty arising from chair, head down, impaired 0 No balance) Mental Status Oriented to own ability 0 Yes Electronic Signature(s) Signed: 07/31/2022 4:27:23 PM By: Thayer Dallas Entered By: Thayer Dallas on 07/31/2022 10:04:15 -------------------------------------------------------------------------------- Foot Assessment Details Patient Name: Date of Service: Shannon Lee [name]. 07/31/2022 9:30 A M Medical Record Number: [id number] Patient Account Number: [id number] Date of Birth/Sex: Treating RN: October 02, 1945 (77 y.o. F) Primary Care Tyrianna Lightle: Danise Edge Other Clinician: Referring Galen Malkowski: Treating Ezrael Sam/Extender: Lawana Chambers Weeks in Treatment: 0 Foot Assessment Items Site Locations + = Sensation present, - = Sensation absent, C = Callus, U = Ulcer R = Redness, W = Warmth, M = Maceration, PU = Pre-ulcerative lesion F = Fissure, S = Swelling, [name] = Dryness Assessment Right: Left: Other Deformity: No No Prior Foot Ulcer: No No Prior Amputation: No  No Charcot Joint: No No Ambulatory Status: Ambulatory Without Help GaitMAKARIA, Shannon [name] ([id number]) [phone] [id number] Page 4 of 4 Notes wound under breast Electronic Signature(s) Signed: 07/31/2022 4:27:23 PM By: Thayer Dallas Entered By: Thayer Dallas on 07/31/2022 10:05:11 -------------------------------------------------------------------------------- Nutrition Risk Screening Details Patient Name: Date of Service: Shannon [name], Shannon [name] 07/31/2022 9:30 A M Medical Record Number: [id number] Patient Account Number: [id number] Date of Birth/Sex: Treating RN: [birth year] (77 y.o. F) Primary Care Derica Leiber: Danise Edge Other Clinician: Referring Tyese Finken: Treating Samanthajo Payano/Extender: Lawana Chambers Weeks in Treatment: 0 Height (in): 64 Weight (lbs): 150 Body Mass Index (BMI): 25.7 Nutrition Risk Screening Items Score Screening NUTRITION RISK SCREEN: I have an illness or condition that made me change the kind and/or amount of food I eat 0 No I eat fewer than two meals per day 0 No I eat few fruits and vegetables, or milk products 0 No I have three or more drinks of beer, liquor or wine almost every day 0 No I have tooth or mouth problems that make it hard for me to eat 0 No I don't always have enough money to buy the food I need 0 No I eat alone most of the time 0 No I take three or more different prescribed or over-the-counter drugs a day 1 Yes Without wanting to, I have lost or gained 10 pounds in the last six months 0 No I am not always physically able to shop, cook and/or feed myself 0 No Nutrition Protocols Good Risk Protocol 0 No interventions needed Moderate Risk Protocol High Risk Proctocol Risk Level: Good Risk Score: 1 Electronic Signature(s) Signed: 07/31/2022 4:27:23 PM By: Thayer Dallas Entered By: Thayer Dallas on 07/31/2022 10:04:47

## 2022-08-09 ENCOUNTER — Ambulatory Visit (HOSPITAL_BASED_OUTPATIENT_CLINIC_OR_DEPARTMENT_OTHER): Payer: Medicare Other | Admitting: General Surgery

## 2022-08-11 ENCOUNTER — Ambulatory Visit (INDEPENDENT_AMBULATORY_CARE_PROVIDER_SITE_OTHER): Payer: Medicare Other | Admitting: Physician Assistant

## 2022-08-11 DIAGNOSIS — R4189 Other symptoms and signs involving cognitive functions and awareness: Secondary | ICD-10-CM

## 2022-08-11 NOTE — Progress Notes (Signed)
Assessment/Plan:   Memory Impairment   Shannon [name] is a very pleasant 77 y.o. RH female with a history of GAD and depression  followed by behavioral health, vitamin D deficiency, nonischemic cardiomyopathy, hypertension, hyperlipidemia, history of PE on AC, chronic diastolic CHF, prediabetes, presenting today in follow-up for evaluation of memory loss. Patient is on donepezil 10 mg daily, tolerating well.  MMSE today is 29/30.  However, she feels that there is subjective cognitive decline.  In view that she has mild cognitive impairment likely due to Alzheimer's disease, while awaiting for the repeat neuropsychological evaluation, it is felt prudent to a new agent to donepezil.     Recommendations:   Follow up in 6 months. Continue donepezil 10 mg daily side effects discussed Start memantine 5 mg nightly, side effects discussed Recommend good control of cardiovascular risk factors Continue to control mood as per PCP and psychiatry for GAD Repeat neuropsych evaluation for clarity of diagnosis     Subjective:   This patient is accompanied in the office by her husband  who supplements the history. Previous records as well as any outside records available were reviewed prior to todays visit.   Patient was last seen on 02/10/2022 with MMSE 29/30     Any changes in memory since last visit? "  Subjectively, she feels that her memory is worse "husband says.  "In conversation, I need a little more time to remember names of people.  I am okay at remembering faces ".  She does not participate in activities, except going to [place], and has high anxiety, is aware of how others will perceive her.  repeats oneself?  Endorsed Disoriented when walking into a room?  Patient denies except occasionally not remembering what patient came to the room for    Leaving objects in unusual places?  Patient denies   Wandering behavior?   denies   Any personality changes since last visit?  She continues to  become frustrated when she cannot remember. Any worsening depression?: denies.    Hallucinations or paranoia?  denies   Seizures?   denies    Any sleep changes? Sleeps well.  Denies frequent vivid dreams, REM behavior or sleepwalking   Sleep apnea?  Endorsed, 78 percent of the time she uses the CPAP, due to a sleep study Any hygiene concerns?   denies   Independent of bathing and dressing?  Endorsed  Does the patient needs help with medications? Patient is in charge, daughter helps  Who is in charge of the finances?  Patient is in charge     Any changes in appetite?  denies     Patient have trouble swallowing?  denies   Does the patient cook? No  Any kitchen accidents such as leaving the stove on?   denies   Any headaches?    denies   Vision changes? denies Chronic pain?  denies   Ambulates with difficulty?  Denies, sometimes she uses a cane     Recent falls or head injuries?  denies      Unilateral weakness, numbness or tingling?  Chronic lower extremity tingling, with prior EMG negative.   Any tremors?  denies   Any anosmia?    denies   Any incontinence of urine?  denies   Any bowel dysfunction?  denies      Patient lives with her husband of 62-years who does not think that she has any memory issues.  Does the patient drive?  "Not really " Husband does  most of the driving.    Neuropsychological evaluation 10/22 Briefly, results suggested significant impairment surrounding confrontation naming, as well as all aspects of verbal and visual memory. Mild performance variability was further exhibited across cognitive flexibility. The most likely culprit for ongoing cognitive decline is unfortunately Alzheimer's disease. Across memory testing, Shannon [name] did not benefit from repeated exposure across learning trials and was essentially amnestic at a delay. Across yes/no recognition trials, she commented that she did not ever recall being presented a previous list of words, stories, or figure and  stated that she was simply guessing. As such, below average scores are likely inflated by happenstance guessing. Overall, memory patterns suggest a severe memory storage deficit, which is the hallmark characteristic of this disease process. Severe impairment in confrontation naming is also very consistent with this disease process.     Initial office visit 2021 had been a difficult year emotionally for her.  She is caring for her husband with dementia.  Her son passed away.  She started having headaches in December 2021.  It followed cataract surgery, so she thought it was due to the eye drops but it persisted after stopping the drops.  They were severe daily headaches that moved around different locations on her head.  She has had some memory problems for a couple of years which has been noticeable to herself and family over the past year.  When talking about recent events, she will forget, such as where she had lunch or what she did that day.  She will repeat herself 5 to 10 times during the same conversation.  Her daughter started managing her medication because she would sometimes forget to take her medications despite using a pill box.  So far, she has been paying her bills without difficulty.  She drives but denies difficulty or disorientation.  One time, she thought that she was playing hide and seek with her granddaughter who was not there.  On 02/18/2020, she exhibited altered mental status.  She thought her mother who is long passed, was in the house cooking dinner. She went to the ED for further evaluation.  CT head personally reviewed unremarkable.  UA showed possible UTI and was prescribed Keflex.  CXR negative and labs negative for leukocytosis or electrolyte imbalance.  She was also dehydrated.  Followed up with primary care.  MRI of brain without contrast on 03/02/2020 personally reviewed showed mild cerebral atrophy with chronic small vessel ischemic changes and sequelae of prior right pterional  craniotomy with no visible tumor recurrence but no acute abnormality.  Headaches now are infrequent, mild and lasting a couple of hours about 2 days a week.  TSH from January was 2.26.       MRI brain 04/2020 No acute intracranial abnormality or significant interval change. 2. Stable mild atrophy and white matter disease. This likely reflects the sequela of chronic microvascular ischemia. 3. No residual or recurrent sellar mass. Right pterional craniotomy noted.     Extensive electrodiagnostic testing of the right lower extremity and additional studies of the left shows:  Right superficial peroneal and left sural sensory responses are absent.  Right sural and left superficial peroneal sensory responses are within normal limits. Bilateral peroneal motor responses show reduced amplitude at the extensor digitorum brevis, and is normal at the tibialis anterior.  Bilateral tibial motor responses are within normal limits. Bilateral tibial H reflex studies are within normal limits. There is no evidence of active or chronic motor axonal loss changes affecting any  of the tested muscles.  Motor unit configuration and recruitment pattern is within normal limits. 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 Echo 2012: EF 45-50%;  b. Lex MV 2/12:  low risk, apical defect (small area of ischemia vs shifting breast atten);  c.  Echo 7/12: Normal wall thickness, EF 60-65%, normal wall motion, grade 1 diastolic dysfunction, mild LAE, PASP 32;   d. Lex MV 11/13:  EF 76%, no ischemia   OA (osteoarthritis) 09/07/2011   Obesity 09/28/2014   Osteoporosis 03/07/2011   DEXA T score -2.6 AP spine 03/07/11    Formatting of this note might be different from the original. Formatting of this note might be different from the original. DEXA T score -2.6 AP spine 03/07/11   Last Assessment & Plan:  Formatting of this note might be different from the original. Encouraged to get adequate exercise, calcium and vitamin d intake   Pain in joint, ankle and foot 09/28/2014   Palpitation 10/28/2020   Personal history of chemotherapy 2012   Personal history of radiation therapy 2012   Prediabetes 06/15/2009   Recurrent falls 12/28/2015   Right knee pain 07/10/2020   Shortness of breath    UTI (urinary tract infection) 03/03/2012   Vertebral fracture, osteoporotic, sequela 04/05/2016   Vitamin D deficiency 06/14/2017   Supplement and monitor     Past Surgical History:  Procedure Laterality Date   BREAST LUMPECTOMY  04/2009   RIGHT FOR BREAST CANCER-CHEMO/RADIATION X 1 YEAR    BREAST REDUCTION SURGERY Bilateral 05/04/2014   Procedure: MAMMARY REDUCTION  (BREAST);  Surgeon: Louisa Second, MD;  Location: [facility];  Service: Plastics;  Laterality: Bilateral;   CARPAL TUNNEL RELEASE     L wrist, ulnar nerve moved   CHOLECYSTECTOMY     CRANIOTOMY FOR TUMOR  2000   ELBOW SURGERY     left   KNEE ARTHROSCOPY Left 10/12/2014   Procedure: LEFT KNEE ARTHROSCOPY ;  Surgeon: Ollen Gross, MD;  Location: [facility];  Service: Orthopedics;  Laterality: Left;   KYPHOPLASTY N/A 03/30/2016   Procedure: THORACIC 12 KYPHOPLASTY;  Surgeon: Estill Bamberg, MD;  Location: [facility];  Service: Orthopedics;  Laterality: N/A;  THORACIC 12 KYPHOPLASTY   LEFT HEART CATHETERIZATION WITH CORONARY ANGIOGRAM N/A 12/16/2013   Procedure: LEFT HEART CATHETERIZATION WITH CORONARY ANGIOGRAM;  Surgeon: Peter M [place]az[place]and, MD;  Location: [facility];  Service: Cardiovascular;  Laterality: N/A;   LIPOMA EXCISION  03/28/2009   right leg   MELANOMA EXCISION     PORT-A-CATH REMOVAL  11/30/2010   Streck   porta cath     PORTACATH PLACEMENT  may 2011   REDUCTION MAMMAPLASTY Bilateral    SYNOVECTOMY Left 10/12/2014   Procedure: WITH SYNOVECTOMY;  Surgeon: Ollen Gross, MD;  Location: [facility];  Service: Orthopedics;  Laterality: Left;   TONSILLECTOMY  1958   TOTAL HIP ARTHROPLASTY Left 11/21/2021   Procedure: TOTAL HIP ARTHROPLASTY ANTERIOR APPROACH;  Surgeon: Samson Frederic, MD;  Location: [facility];  Service: Orthopedics;  Laterality: Left;   TOTAL KNEE ARTHROPLASTY  02/05/2012   Procedure: TOTAL KNEE ARTHROPLASTY;  Surgeon: Loanne Drilling, MD;  Location: [facility];  Service: Orthopedics;  Laterality: Right;   TOTAL KNEE ARTHROPLASTY Left 02/10/2013   Procedure: LEFT TOTAL KNEE ARTHROPLASTY;  Surgeon: Loanne Drilling, MD;  Location: [facility];  Service: Orthopedics;  Laterality: Left;   total knee raplacement  01-2012   Right Knee   TOTAL KNEE REVISION Left 07/14/2020   Procedure: TOTAL KNEE REVISION;   Surgeon: Ollen Gross, MD;  Location: [facility];  Service: Orthopedics;  Laterality: Left;   TUBAL LIGATION  1997     PREVIOUS MEDICATIONS:   CURRENT MEDICATIONS:  Outpatient Encounter Medications as of 08/11/2022  Medication Sig   memantine (NAMENDA) 5 MG tablet Take 1 tablet (5 mg total) by mouth at bedtime.   acetaminophen (TYLENOL) 325 MG tablet Take 2 tablets (650 mg total) by mouth every 6 (six) hours as needed for mild pain or moderate pain (pain score 1-3 or temp > 100.5).   albuterol (VENTOLIN HFA) 108 (90 Base) MCG/ACT inhaler INHALE 1-2 PUFFS INTO THE LUNGS EVERY 6 (SIX) HOURS AS NEEDED FOR WHEEZING OR SHORTNESS OF BREATH.   alendronate (FOSAMAX) 70 MG tablet TAKE 1 TABLET (70 MG TOTAL) BY MOUTH EVERY 7 (SEVEN) DAYS. TAKE WITH A FULL GLASS OF WATER ON AN EMPTY STOMACH.   Calcium Carbonate (CALCIUM 600 PO) Take 1 tablet by mouth daily.   carvedilol (COREG) 25 MG tablet TAKE 1 TABLET TWICE DAILY WITH MEALS   cetirizine (ZYRTEC) 10 MG tablet Take 10 mg by mouth at bedtime.   cholecalciferol (VITAMIN D3) 25 MCG (1000 UNIT) tablet Take 1,000 Units by mouth daily.   diltiazem (CARDIZEM CD) 120 MG 24 hr capsule TAKE 1 CAPSULE BY MOUTH EVERY DAY   docusate sodium (COLACE) 100 MG capsule Take 1 capsule (100 mg total) by mouth 2 (two) times daily. (Patient taking differently: Take 100 mg by mouth 2 (two) times daily as needed.)   donepezil (ARICEPT) 10 MG tablet TAKE 1 TABLET AT BEDTIME   famotidine (PEPCID) 20 MG tablet TAKE 1 TABLET TWICE DAILY   ferrous sulfate 325 (65 FE) MG tablet Take 325 mg by mouth daily with breakfast.   folic acid (FOLVITE) 1 MG tablet Take 1 tablet (1 mg total) by mouth daily.   gabapentin (NEURONTIN) 300 MG capsule Take 1 capsule (300 mg total) by mouth 2 (two) times daily.   hydrOXYzine (ATARAX) 25 MG tablet TAKE 0.5-1  TABLETS (12.5-25 MG TOTAL) BY MOUTH 2 (TWO) TIMES DAILY AS NEEDED. (Patient taking differently: Take 25 mg by mouth 2 (two) times daily as needed  (allergies).)   Multiple Vitamin (MULTIVITAMIN WITH MINERALS) TABS tablet Take 1 tablet by mouth daily.   omeprazole (PRILOSEC) 20 MG capsule TAKE 1 CAPSULE BY MOUTH EVERY DAY   pentosan polysulfate (ELMIRON) 100 MG capsule Take 200 mg by mouth in the morning and at bedtime.   polyethylene glycol (MIRALAX / GLYCOLAX) 17 g packet Take 17 g by mouth daily as needed for mild constipation.   rosuvastatin (CRESTOR) 20 MG tablet TAKE 1 TABLET AT BEDTIME (Patient taking differently: Take 20 mg by mouth at bedtime.)   venlafaxine (EFFEXOR) 75 MG tablet Take 225 mg by mouth in the morning.   No facility-administered encounter medications on file as of 08/11/2022.     Objective:     PHYSICAL EXAMINATION:    VITALS:  There were no vitals filed for this visit.  GEN:  The patient appears stated age and is in NAD. HEENT:  Normocephalic, atraumatic.   Neurological examination:  General: NAD, well-groomed, appears stated age. Orientation: The patient is alert. Oriented to person, place and date Cranial nerves: There is good facial symmetry.The speech is fluent and clear. No aphasia or dysarthria. Fund of knowledge is appropriate. Recent memory impaired and remote memory is normal.  Attention and concentration are normal.  Able to name objects and repeat phrases.  Hearing is intact to conversational tone .   Delayed recall 3/3 Sensation: Sensation is intact to light touch throughout Motor: Strength is at least antigravity x4. DTR's 2/4 in UE/LE       No data to display             02/10/2022    8:00 PM 11/28/2021    1:38 PM  MMSE - Mini Mental State Exam  Orientation to time 4 5  Orientation to Place 5 5  Registration 3 3  Attention/ Calculation 5 5  Recall 3 3  Language- name 2 objects 2 2  Language- repeat 1 1  Language- follow 3 step command 3 3  Language- read & follow direction 1 1  Write a sentence 1 1  Copy design 1 1  Total score 29 30       Movement examination: Tone:  There is normal tone in the UE/LE Abnormal movements:  no tremor.  No myoclonus.  No asterixis.   Coordination:  There is no decremation with RAM's. Normal finger to nose  Gait and Station: The patient has no difficulty arising out of a deep-seated chair without the use of the hands. The patient's stride length is good.  Gait is cautious and narrow.   Thank you for allowing [place] the opportunity to participate in the care of this nice patient. Please do not hesitate to contact us for any questions or concerns.   Total time spent on today's visit was *** minutes dedicated to this patient today, preparing to see patient, examining the patient, ordering tests and/or medications and counseling the patient, documenting clinical information in the EHR or other health record, independently interpreting results and communicating results to the patient/family, discussing treatment and goals, answering patient's questions and coordinating care.  Cc:  Bradd Canary, MD  Marlowe Kays 08/31/2022 4:36 PM

## 2022-08-31 ENCOUNTER — Ambulatory Visit (INDEPENDENT_AMBULATORY_CARE_PROVIDER_SITE_OTHER): Payer: Medicare Other | Admitting: Physician Assistant

## 2022-08-31 ENCOUNTER — Encounter: Payer: Self-pay | Admitting: Physician Assistant

## 2022-08-31 VITALS — BP 127/77 | HR 75 | Resp 18 | Ht 63.0 in | Wt 169.0 lb

## 2022-08-31 DIAGNOSIS — F0670 Mild neurocognitive disorder due to known physiological condition without behavioral disturbance: Secondary | ICD-10-CM

## 2022-08-31 DIAGNOSIS — G309 Alzheimer's disease, unspecified: Secondary | ICD-10-CM

## 2022-08-31 DIAGNOSIS — R413 Other amnesia: Secondary | ICD-10-CM | POA: Diagnosis not present

## 2022-08-31 MED ORDER — MEMANTINE HCL 5 MG PO TABS: 5.0000 mg | ORAL_TABLET | Freq: Every evening | ORAL | 11 refills | Status: DC

## 2022-08-31 NOTE — Patient Instructions (Addendum)
It was a pleasure to see you today at our office.   Recommendations:  Follow up in  6 months Neuropsychological evaluation Continue donepezil 10 mg daily   Start Memantine 5 mg nightly  Continue behavioral therapy for mood   Whom to call:  Memory  decline, memory medications: Call our office [phone]   For psychiatric meds, mood meds: Please have your primary care physician manage these medications.       For assessment of decision of mental capacity and competency:  Call Dr. Erick Blinks, geriatric psychiatrist at [phone]  For guidance in geriatric dementia issues please call [facility] [phone]     If you have any severe symptoms of a stroke, or other severe issues such as confusion,severe chills or fever, etc call 911 or go to the ER as you may need to be evaluated further   Feel free to visit Facebook page " [email or web address]" for tips of how to care for people with memory problems.     RECOMMENDATIONS FOR ALL PATIENTS WITH MEMORY PROBLEMS: 1. Continue to exercise (Recommend 30 minutes of walking everyday, or 3 hours every week) 2. Increase social interactions - continue going to [place] and enjoy social gatherings with friends and family 3. Eat healthy, avoid fried foods and eat more fruits and vegetables 4. Maintain adequate blood pressure, blood sugar, and blood cholesterol level. Reducing the risk of stroke and cardiovascular disease also helps promoting better memory. 5. Avoid stressful situations. Live a simple life and avoid aggravations. Organize your time and prepare for the next day in anticipation. 6. Sleep well, avoid any interruptions of sleep and avoid any distractions in the bedroom that may interfere with adequate sleep quality 7. Avoid sugar, avoid sweets as there is a strong link between excessive sugar intake, diabetes, and cognitive impairment We discussed the Mediterranean diet, which has been shown to help patients reduce the risk of  progressive memory disorders and reduces cardiovascular risk. This includes eating fish, eat fruits and green leafy vegetables, nuts like almonds and hazelnuts, walnuts, and also use olive oil. Avoid fast foods and fried foods as much as possible. Avoid sweets and sugar as sugar use has been linked to worsening of memory function.  There is always a concern of gradual progression of memory problems. If this is the case, then we may need to adjust level of care according to patient needs. Support, both to the patient and caregiver, should then be put into place.    FALL PRECAUTIONS: Be cautious when walking. Scan the area for obstacles that may increase the risk of trips and falls. When getting up in the mornings, sit up at the edge of the bed for a few minutes before getting out of bed. Consider elevating the bed at the head end to avoid drop of blood pressure when getting up. Walk always in a well-lit room (use night lights in the walls). Avoid area rugs or power cords from appliances in the middle of the walkways. Use a walker or a cane if necessary and consider physical therapy for balance exercise. Get your eyesight checked regularly.  FINANCIAL OVERSIGHT: Supervision, especially oversight when making financial decisions or transactions is also recommended.  HOME SAFETY: Consider the safety of the kitchen when operating appliances like stoves, microwave oven, and blender. Consider having supervision and share cooking responsibilities until no longer able to participate in those. Accidents with firearms and other hazards in the house should be identified and addressed as well.  ABILITY TO BE LEFT ALONE: If patient is unable to contact 911 operator, consider using LifeLine, or when the need is there, arrange for someone to stay with patients. Smoking is a fire hazard, consider supervision or cessation. Risk of wandering should be assessed by caregiver and if detected at any point, supervision and safe  proof recommendations should be instituted.  MEDICATION SUPERVISION: Inability to self-administer medication needs to be constantly addressed. Implement a mechanism to ensure safe administration of the medications.   DRIVING: Regarding driving, in patients with progressive memory problems, driving will be impaired. We advise to have someone else do the driving if trouble finding directions or if minor accidents are reported. Independent driving assessment is available to determine safety of driving.   If you are interested in the driving assessment, you can contact the following:  [employer] in [place] [phone]  [facility] [phone]  [facility] [phone]  [facility] [phone] or [phone]

## 2022-08-31 NOTE — Progress Notes (Addendum)
Assessment/Plan:   Mild Cognitive Impairment likely due to Alzheimer's disease   Shannon [name] is a very pleasant 77 y.o. RH female with a history of GAD and depression  followed by behavioral health, vitamin D deficiency, nonischemic cardiomyopathy, hypertension, hyperlipidemia, history of PE on AC, chronic diastolic CHF, prediabetes, presenting today in follow-up for evaluation of memory loss. Patient is on donepezil 10 mg daily, tolerating well.  MMSE today is 29/30.  However, she feels that there is subjective cognitive decline.  In view that she has mild cognitive impairment likely due to Alzheimer's disease, while awaiting for the repeat neuropsychological evaluation, it is felt prudent to a new agent to donepezil.     Recommendations:   Follow up in 6 months. Continue donepezil 10 mg daily side effects discussed Start memantine 5 mg nightly, side effects discussed Recommend good control of cardiovascular risk factors Continue to control mood as per PCP and psychiatry for GAD Repeat neuropsych evaluation for clarity of diagnosis     Subjective:   This patient is accompanied in the office by her husband  who supplements the history. Previous records as well as any outside records available were reviewed prior to todays visit.   Patient was last seen on 02/10/2022 with MMSE 29/30     Any changes in memory since last visit? "  Subjectively, she feels that her memory is worse "husband says.  "In conversation, I need a little more time to remember names of people.  I am okay at remembering faces ".  She does not participate in activities, except going to [place], and has high anxiety, is aware of how others will perceive her.  repeats oneself?  Endorsed Disoriented when walking into a room?  Patient denies except occasionally not remembering what patient came to the room for    Leaving objects in unusual places?  Patient denies   Wandering behavior?   denies   Any personality changes  since last visit?  She continues to become frustrated when she cannot remember. Any worsening depression?: denies.    Hallucinations or paranoia?  denies   Seizures?   denies    Any sleep changes? Sleeps well.  Denies frequent vivid dreams, REM behavior or sleepwalking   Sleep apnea?  Endorsed, 70 percent of the time she uses the CPAP, due to a sleep study Any hygiene concerns?   denies   Independent of bathing and dressing?  Endorsed  Does the patient needs help with medications? Patient is in charge, daughter helps  Who is in charge of the finances?  Patient is in charge     Any changes in appetite?  denies     Patient have trouble swallowing?  denies   Does the patient cook? No  Any kitchen accidents such as leaving the stove on?   denies   Any headaches?    denies   Vision changes? denies Chronic pain?  denies   Ambulates with difficulty?  Denies, sometimes she uses a cane     Recent falls or head injuries?  denies      Unilateral weakness, numbness or tingling?  Chronic lower extremity tingling, with prior EMG negative.   Any tremors?  denies   Any anosmia?    denies   Any incontinence of urine?  denies   Any bowel dysfunction?  denies      Patient lives with her husband of 62-years who does not think that she has any memory issues.  Does the patient drive?  "  Not really " Husband does most of the driving.    Neuropsychological evaluation 10/22 Briefly, results suggested significant impairment surrounding confrontation naming, as well as all aspects of verbal and visual memory. Mild performance variability was further exhibited across cognitive flexibility. The most likely culprit for ongoing cognitive decline is unfortunately Alzheimer's disease. Across memory testing, Shannon [name] did not benefit from repeated exposure across learning trials and was essentially amnestic at a delay. Across yes/no recognition trials, she commented that she did not ever recall being presented a previous  list of words, stories, or figure and stated that she was simply guessing. As such, below average scores are likely inflated by happenstance guessing. Overall, memory patterns suggest a severe memory storage deficit, which is the hallmark characteristic of this disease process. Severe impairment in confrontation naming is also very consistent with this disease process.     Initial office visit 2021 had been a difficult year emotionally for her.  She is caring for her husband with dementia.  Her son passed away.  She started having headaches in December 2021.  It followed cataract surgery, so she thought it was due to the eye drops but it persisted after stopping the drops.  They were severe daily headaches that moved around different locations on her head.  She has had some memory problems for a couple of years which has been noticeable to herself and family over the past year.  When talking about recent events, she will forget, such as where she had lunch or what she did that day.  She will repeat herself 5 to 10 times during the same conversation.  Her daughter started managing her medication because she would sometimes forget to take her medications despite using a pill box.  So far, she has been paying her bills without difficulty.  She drives but denies difficulty or disorientation.  One time, she thought that she was playing hide and seek with her granddaughter who was not there.  On 02/18/2020, she exhibited altered mental status.  She thought her mother who is long passed, was in the house cooking dinner. She went to the ED for further evaluation.  CT head personally reviewed unremarkable.  UA showed possible UTI and was prescribed Keflex.  CXR negative and labs negative for leukocytosis or electrolyte imbalance.  She was also dehydrated.  Followed up with primary care.  MRI of brain without contrast on 03/02/2020 personally reviewed showed mild cerebral atrophy with chronic small vessel ischemic changes and  sequelae of prior right pterional craniotomy with no visible tumor recurrence but no acute abnormality.  Headaches now are infrequent, mild and lasting a couple of hours about 2 days a week.  TSH from January was 2.26.       MRI brain 04/2020 No acute intracranial abnormality or significant interval change. 2. Stable mild atrophy and white matter disease. This likely reflects the sequela of chronic microvascular ischemia. 3. No residual or recurrent sellar mass. Right pterional craniotomy noted.     Extensive electrodiagnostic testing of the right lower extremity and additional studies of the left shows:  Right superficial peroneal and left sural sensory responses are absent.  Right sural and left superficial peroneal sensory responses are within normal limits. Bilateral peroneal motor responses show reduced amplitude at the extensor digitorum brevis, and is normal at the tibialis anterior.  Bilateral tibial motor responses are within normal limits. Bilateral tibial H reflex studies are within normal limits. There is no evidence of active or chronic motor  axonal loss changes affecting any of the tested muscles.  Motor unit configuration and recruitment pattern is within normal limits. 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 Echo 2012: EF 45-50%;  b. Lex MV 2/12:  low risk, apical defect (small area of ischemia vs shifting breast atten);  c.  Echo 7/12: Normal wall thickness, EF 60-65%, normal wall motion, grade 1 diastolic dysfunction, mild LAE, PASP 32;   d. Lex MV 11/13:  EF 76%, no ischemia   OA (osteoarthritis) 09/07/2011   Obesity 09/28/2014   Osteoporosis 03/07/2011   DEXA T score -2.6 AP spine 03/07/11    Formatting of this note might be different from the original. Formatting of this note might be different from the original. DEXA T score -2.6 AP spine 03/07/11   Last Assessment & Plan:  Formatting of this note might be different from the original. Encouraged to get adequate exercise, calcium and vitamin d intake   Pain in joint, ankle and foot 09/28/2014   Palpitation 10/28/2020   Personal history of chemotherapy 2012   Personal history of radiation therapy 2012   Prediabetes 06/15/2009   Recurrent falls 12/28/2015   Right knee pain 07/10/2020   Shortness of breath    UTI (urinary tract infection) 03/03/2012   Vertebral fracture, osteoporotic, sequela 04/05/2016   Vitamin D deficiency 06/14/2017   Supplement and monitor     Past Surgical History:  Procedure Laterality Date   BREAST LUMPECTOMY  04/2009   RIGHT FOR BREAST  CANCER-CHEMO/RADIATION X 1 YEAR   BREAST REDUCTION SURGERY Bilateral 05/04/2014   Procedure: MAMMARY REDUCTION  (BREAST);  Surgeon: Louisa Second, MD;  Location: [facility];  Service: Plastics;  Laterality: Bilateral;   CARPAL TUNNEL RELEASE     L wrist, ulnar nerve moved   CHOLECYSTECTOMY     CRANIOTOMY FOR TUMOR  2000   ELBOW SURGERY     left   KNEE ARTHROSCOPY Left 10/12/2014   Procedure: LEFT KNEE ARTHROSCOPY ;  Surgeon: Ollen Gross, MD;  Location: [facility];  Service: Orthopedics;  Laterality: Left;   KYPHOPLASTY N/A 03/30/2016   Procedure: THORACIC 12 KYPHOPLASTY;  Surgeon: Estill Bamberg, MD;  Location: [facility];  Service: Orthopedics;  Laterality: N/A;  THORACIC 12 KYPHOPLASTY   LEFT HEART CATHETERIZATION WITH CORONARY ANGIOGRAM N/A 12/16/2013   Procedure: LEFT HEART CATHETERIZATION WITH CORONARY ANGIOGRAM;  Surgeon: Peter M Swaziland, MD;  Location: [facility];  Service: Cardiovascular;  Laterality: N/A;   LIPOMA EXCISION  03/28/2009   right leg   MELANOMA EXCISION     PORT-A-CATH REMOVAL  11/30/2010   Streck   porta cath     PORTACATH PLACEMENT  may 2011   REDUCTION MAMMAPLASTY Bilateral    SYNOVECTOMY Left 10/12/2014   Procedure: WITH SYNOVECTOMY;  Surgeon: Ollen Gross, MD;  Location: [facility];  Service: Orthopedics;  Laterality: Left;   TONSILLECTOMY  1958   TOTAL HIP ARTHROPLASTY Left 11/21/2021   Procedure: TOTAL HIP ARTHROPLASTY ANTERIOR APPROACH;  Surgeon: Samson Frederic, MD;  Location: [facility];  Service: Orthopedics;  Laterality: Left;   TOTAL KNEE ARTHROPLASTY  02/05/2012   Procedure: TOTAL KNEE ARTHROPLASTY;  Surgeon: Loanne Drilling, MD;  Location: [facility];  Service: Orthopedics;  Laterality: Right;   TOTAL KNEE ARTHROPLASTY Left 02/10/2013   Procedure: LEFT TOTAL KNEE ARTHROPLASTY;  Surgeon: Loanne Drilling, MD;  Location: [facility];  Service: Orthopedics;  Laterality: Left;   total knee raplacement  01-2012   Right Knee   TOTAL KNEE REVISION Left 07/14/2020    Procedure: TOTAL KNEE REVISION;  Surgeon: Ollen Gross, MD;  Location: [facility];  Service: Orthopedics;  Laterality: Left;   TUBAL LIGATION  1997     PREVIOUS MEDICATIONS:   CURRENT MEDICATIONS:  Outpatient Encounter Medications as of 08/31/2022  Medication Sig   acetaminophen (TYLENOL) 325 MG tablet Take 2 tablets (650 mg total) by mouth every 6 (six) hours as needed for mild pain or moderate pain (pain score 1-3 or temp > 100.5).   albuterol (VENTOLIN HFA) 108 (90 Base) MCG/ACT inhaler INHALE 1-2 PUFFS INTO THE LUNGS EVERY 6 (SIX) HOURS AS NEEDED FOR WHEEZING OR SHORTNESS OF BREATH.   alendronate (FOSAMAX) 70 MG tablet TAKE 1 TABLET (70 MG TOTAL) BY MOUTH EVERY 7 (SEVEN) DAYS. TAKE WITH A FULL GLASS OF WATER ON AN EMPTY STOMACH.   Calcium Carbonate (CALCIUM 600 PO) Take 1 tablet by mouth daily.   carvedilol (COREG) 25 MG tablet TAKE 1 TABLET TWICE DAILY WITH MEALS   cetirizine (ZYRTEC) 10 MG tablet Take 10 mg by mouth at bedtime.   cholecalciferol (VITAMIN D3) 25 MCG (1000 UNIT) tablet Take 1,000 Units by mouth daily.   diltiazem (CARDIZEM CD) 120 MG 24 hr capsule TAKE 1 CAPSULE BY MOUTH EVERY DAY   docusate sodium (COLACE) 100 MG capsule Take 1 capsule (100 mg total) by mouth 2 (two) times daily. (Patient taking differently: Take 100 mg by mouth 2 (two) times daily as needed.)   donepezil (ARICEPT) 10 MG tablet TAKE 1 TABLET AT BEDTIME   ELIQUIS 5 MG TABS tablet Take 5 mg by mouth 2 (two) times daily.   famotidine (PEPCID) 20 MG tablet TAKE 1 TABLET TWICE DAILY   ferrous sulfate 325 (65 FE) MG tablet Take 325 mg by mouth daily with breakfast.   folic acid (FOLVITE) 1 MG tablet Take 1 tablet (1 mg total) by mouth daily.   gabapentin (NEURONTIN) 300 MG capsule Take 1 capsule (300 mg total) by mouth 2 (two) times daily.   hydrOXYzine (ATARAX) 25  MG tablet TAKE 0.5-1 TABLETS (12.5-25 MG TOTAL) BY MOUTH 2 (TWO) TIMES DAILY AS NEEDED. (Patient taking differently: Take 25 mg by mouth 2 (two) times  daily as needed (allergies).)   Multiple Vitamin (MULTIVITAMIN WITH MINERALS) TABS tablet Take 1 tablet by mouth daily.   omeprazole (PRILOSEC) 20 MG capsule TAKE 1 CAPSULE BY MOUTH EVERY DAY   pentosan polysulfate (ELMIRON) 100 MG capsule Take 200 mg by mouth in the morning and at bedtime.   polyethylene glycol (MIRALAX / GLYCOLAX) 17 g packet Take 17 g by mouth daily as needed for mild constipation.   rosuvastatin (CRESTOR) 20 MG tablet TAKE 1 TABLET AT BEDTIME (Patient taking differently: Take 20 mg by mouth at bedtime.)   venlafaxine (EFFEXOR) 75 MG tablet Take 225 mg by mouth in the morning.   No facility-administered encounter medications on file as of 08/31/2022.     Objective:     PHYSICAL EXAMINATION:    VITALS:   Vitals:   08/31/22 1453  BP: 127/77  Pulse: 75  Resp: 18  SpO2: 96%  Weight: 169 lb (76.7 kg)  Height: 5\' 3"  (1.6 m)    GEN:  The patient appears stated age and is in NAD. HEENT:  Normocephalic, atraumatic.   Neurological examination:  General: NAD, well-groomed, appears stated age. Orientation: The patient is alert. Oriented to person, place and date Cranial nerves: There is good facial symmetry.The speech is fluent and clear. No aphasia or dysarthria. Fund of knowledge is appropriate. Recent memory impaired and remote memory is normal.  Attention and concentration are normal.  Able to name objects and repeat phrases.  Hearing is intact to conversational tone .   Delayed recall 3/3 Sensation: Sensation is intact to light touch throughout Motor: Strength is at least antigravity x4. DTR's 2/4 in UE/LE       No data to display             08/31/2022    4:00 PM 02/10/2022    8:00 PM 11/28/2021    1:38 PM  MMSE - Mini Mental State Exam  Orientation to time 5 4 5   Orientation to Place 5 5 5   Registration 3 3 3   Attention/ Calculation 5 5 5   Recall 3 3 3   Language- name 2 objects 2 2 2   Language- repeat 1 1 1   Language- follow 3 step command 3 3 3    Language- read & follow direction 1 1 1   Write a sentence 1 1 1   Copy design 0 1 1  Total score 29 29 30        Movement examination: Tone: There is normal tone in the UE/LE Abnormal movements:  no tremor.  No myoclonus.  No asterixis.   Coordination:  There is no decremation with RAM's. Normal finger to nose  Gait and Station: The patient has no difficulty arising out of a deep-seated chair without the use of the hands. The patient's stride length is good.  Gait is cautious and narrow.   Thank you for allowing [place] the opportunity to participate in the care of this nice patient. Please do not hesitate to contact us for any questions or concerns.   Total time spent on today's visit was 27 minutes dedicated to this patient today, preparing to see patient, examining the patient, ordering tests and/or medications and counseling the patient, documenting clinical information in the EHR or other health record, independently interpreting results and communicating results to the patient/family, discussing treatment and goals, answering patient's questions  and coordinating care.  Cc:  Bradd Canary, MD  Marlowe Kays 09/01/2022 6:41 PM

## 2022-09-14 ENCOUNTER — Encounter (INDEPENDENT_AMBULATORY_CARE_PROVIDER_SITE_OTHER): Payer: Self-pay

## 2022-10-02 ENCOUNTER — Other Ambulatory Visit: Payer: Self-pay | Admitting: Family Medicine

## 2022-10-18 ENCOUNTER — Telehealth: Payer: Self-pay | Admitting: Family Medicine

## 2022-10-18 NOTE — Telephone Encounter (Signed)
Okay to fill?  Last filled in Jan 2023 by neurology.

## 2022-10-18 NOTE — Telephone Encounter (Signed)
Prescription Request  10/18/2022  Is this a "Controlled Substance" medicine? Yes  LOV: 07/18/2022  What is the name of the medication or equipment?   gabapentin (NEURONTIN) 300 MG capsule [[id number]]  Have you contacted your pharmacy to request a refill? No   Which pharmacy would you like this sent to?   [facility] - [place], [place] - [address] [place] [address] Phone: [phone] Fax: [phone]  Patient notified that their request is being sent to the clinical staff for review and that they should receive a response within 2 business days.   Please advise at Mobile [phone] (mobile)

## 2022-10-19 ENCOUNTER — Ambulatory Visit: Payer: Medicare Other | Admitting: Family Medicine

## 2022-10-24 ENCOUNTER — Ambulatory Visit (INDEPENDENT_AMBULATORY_CARE_PROVIDER_SITE_OTHER): Payer: Medicare Other | Admitting: Family Medicine

## 2022-10-24 ENCOUNTER — Encounter: Payer: Self-pay | Admitting: Family Medicine

## 2022-10-24 VITALS — BP 116/58 | HR 67 | Ht 63.0 in | Wt 169.0 lb

## 2022-10-24 DIAGNOSIS — E559 Vitamin D deficiency, unspecified: Secondary | ICD-10-CM | POA: Diagnosis not present

## 2022-10-24 DIAGNOSIS — F0390 Unspecified dementia without behavioral disturbance: Secondary | ICD-10-CM | POA: Diagnosis not present

## 2022-10-24 DIAGNOSIS — M810 Age-related osteoporosis without current pathological fracture: Secondary | ICD-10-CM | POA: Diagnosis not present

## 2022-10-24 DIAGNOSIS — G629 Polyneuropathy, unspecified: Secondary | ICD-10-CM | POA: Diagnosis not present

## 2022-10-24 DIAGNOSIS — F418 Other specified anxiety disorders: Secondary | ICD-10-CM

## 2022-10-24 DIAGNOSIS — Z23 Encounter for immunization: Secondary | ICD-10-CM

## 2022-10-24 DIAGNOSIS — I10 Essential (primary) hypertension: Secondary | ICD-10-CM

## 2022-10-24 MED ORDER — GABAPENTIN 300 MG PO CAPS: 300.0000 mg | ORAL_CAPSULE | Freq: Two times a day (BID) | ORAL | 1 refills | Status: DC

## 2022-10-24 NOTE — Assessment & Plan Note (Signed)
Doing well with Fosamax Continue lifestyle measures

## 2022-10-24 NOTE — Assessment & Plan Note (Signed)
Reports worsening burning sensation in legs. Previously on Gabapentin with no reported side effects. -Refill Gabapentin 300mg  BID.

## 2022-10-24 NOTE — Progress Notes (Signed)
Established Patient Office Visit  Subjective   Patient ID: Shannon [name], female    DOB: December 28, 1945  Age: 77 y.o. MRN: [id number]  Chief Complaint  Patient presents with   Medical Management of Chronic Issues    HPI  Discussed the use of AI scribe software for clinical note transcription with the patient, who gave verbal consent to proceed.  History of Present Illness   The patient presents for routine follow-up. States she has been doing fairly well overall. She is here today with her husband.  The patient is on a comprehensive medication regimen, including Fosamax for osteoporosis, Coreg, Cardizem, Eliquis, and Crestor for cardiovascular health, Effexor for mood stabilization, and Prilosec and Pepcid for reflux. They also take iron, folic acid, and occasional stool softeners. They report good control of their respiratory symptoms with an albuterol inhaler.  For cognitive concerns, the patient is on Aricept and Namenda. They express interest in exploring new dementia medications, acknowledging their early-stage diagnosis. She is established with neurology and following up with them in February.  The patient also reports symptoms of neuropathy, describing a sensation of their legs being "on fire." They have previously been prescribed gabapentin for this issue, but it is unclear if they are currently taking it. They express a desire for a refill of this medication.  They report occasional exercise and attempts to maintain a balanced diet. They have recently received a flu shot and are due for a shingles vaccine, which they plan to obtain from a pharmacy.             ROS All review of systems negative except what is listed in the HPI    Objective:     BP (!) 116/58   Pulse 67   Ht 5\' 3"  (1.6 m)   Wt 169 lb (76.7 kg)   LMP 01/30/1994   SpO2 97%   BMI 29.94 kg/m    Physical Exam Vitals reviewed.  Constitutional:      Appearance: Normal appearance.  Cardiovascular:      Rate and Rhythm: Normal rate and regular rhythm.     Heart sounds: Normal heart sounds.  Pulmonary:     Effort: Pulmonary effort is normal.     Breath sounds: Normal breath sounds.  Musculoskeletal:     Right lower leg: No edema.     Left lower leg: No edema.  Skin:    General: Skin is warm and dry.  Neurological:     Mental Status: She is alert and oriented to person, place, and time.  Psychiatric:        Mood and Affect: Mood normal.        Behavior: Behavior normal.        Thought Content: Thought content normal.        Judgment: Judgment normal.      No results found for any visits on 10/24/22.    The 10-year ASCVD risk score (Arnett DK, et al., 2019) is: 23.5%    Assessment & Plan:   Problem List Items Addressed This Visit       Active Problems   Essential hypertension    Blood pressure is at goal for age and co-morbidities.   Recommendations: continue current regimen  - BP goal <130/80 - monitor and log blood pressures at home - check around the same time each day in a relaxed setting - Limit salt to <2000 mg/day - Follow DASH eating plan (heart healthy diet) - limit alcohol to 2  standard drinks per day for men and 1 per day for women - avoid tobacco products - get at least 2 hours of regular aerobic exercise weekly Patient aware of signs/symptoms requiring further/urgent evaluation. Labs declined today        Osteoporosis    Doing well with Fosamax Continue lifestyle measures       Vitamin D deficiency    Continue supplement Normal labs 3 months ago, declined recheck       Depression with anxiety    No SI/HI Stable on current regimen      Dementia (HCC)    Currently on Aricept and Namenda. Neurologist follow-up scheduled for February. -Continue Neuro follow-up and lifestyle measures      Relevant Medications   gabapentin (NEURONTIN) 300 MG capsule   Neuropathy    Reports worsening burning sensation in legs. Previously on Gabapentin  with no reported side effects. -Refill Gabapentin 300mg  BID.      Relevant Medications   gabapentin (NEURONTIN) 300 MG capsule   Other Visit Diagnoses     Flu vaccine need    -  Primary   Relevant Orders   Flu Vaccine Trivalent High Dose (Fluad) (Completed)        Return in about 6 months (around 04/23/2023) for routine follow-up.    Clayborne Dana, NP

## 2022-10-24 NOTE — Assessment & Plan Note (Signed)
Blood pressure is at goal for age and co-morbidities.   Recommendations: continue current regimen  - BP goal <130/80 - monitor and log blood pressures at home - check around the same time each day in a relaxed setting - Limit salt to <2000 mg/day - Follow DASH eating plan (heart healthy diet) - limit alcohol to 2 standard drinks per day for men and 1 per day for women - avoid tobacco products - get at least 2 hours of regular aerobic exercise weekly Patient aware of signs/symptoms requiring further/urgent evaluation. Labs declined today

## 2022-10-24 NOTE — Assessment & Plan Note (Signed)
Currently on Aricept and Namenda. Neurologist follow-up scheduled for February. -Continue Neuro follow-up and lifestyle measures

## 2022-10-24 NOTE — Assessment & Plan Note (Signed)
Continue supplement Normal labs 3 months ago, declined recheck

## 2022-10-24 NOTE — Assessment & Plan Note (Signed)
No SI/HI Stable on current regimen

## 2022-11-03 ENCOUNTER — Other Ambulatory Visit (HOSPITAL_BASED_OUTPATIENT_CLINIC_OR_DEPARTMENT_OTHER): Payer: Self-pay

## 2022-11-20 ENCOUNTER — Encounter: Payer: Self-pay | Admitting: Family Medicine

## 2022-11-20 ENCOUNTER — Other Ambulatory Visit: Payer: Self-pay

## 2022-11-20 ENCOUNTER — Telehealth: Payer: Self-pay | Admitting: Family Medicine

## 2022-11-20 MED ORDER — VENLAFAXINE HCL 75 MG PO TABS: 225.0000 mg | ORAL_TABLET | Freq: Every morning | ORAL | 1 refills | Status: DC

## 2022-11-20 NOTE — Telephone Encounter (Signed)
Prescription Request  11/20/2022  Is this a "Controlled Substance" medicine? No  LOV: 07/18/2022  What is the name of the medication or equipment? venlafaxine (EFFEXOR) 75 MG tablet   Have you contacted your pharmacy to request a refill? Yes   Which pharmacy would you like this sent to?  [facility] - [place], [place] - [address] [place] [address] Phone: [phone] Fax: [phone]      Patient notified that their request is being sent to the clinical staff for review and that they should receive a response within 2 business days.   Please advise at [facility] [phone]

## 2022-11-20 NOTE — Telephone Encounter (Signed)
error 

## 2022-11-20 NOTE — Telephone Encounter (Signed)
Refill sent.

## 2022-11-28 DIAGNOSIS — R3915 Urgency of urination: Secondary | ICD-10-CM | POA: Diagnosis not present

## 2022-11-28 DIAGNOSIS — R351 Nocturia: Secondary | ICD-10-CM | POA: Diagnosis not present

## 2022-11-28 DIAGNOSIS — R3581 Nocturnal polyuria: Secondary | ICD-10-CM | POA: Diagnosis not present

## 2022-11-28 DIAGNOSIS — N3010 Interstitial cystitis (chronic) without hematuria: Secondary | ICD-10-CM | POA: Diagnosis not present

## 2022-11-28 DIAGNOSIS — R350 Frequency of micturition: Secondary | ICD-10-CM | POA: Diagnosis not present

## 2022-11-28 DIAGNOSIS — R102 Pelvic and perineal pain: Secondary | ICD-10-CM | POA: Diagnosis not present

## 2022-11-30 ENCOUNTER — Telehealth: Payer: Self-pay | Admitting: Family Medicine

## 2022-11-30 NOTE — Telephone Encounter (Signed)
**  Shannon [name] called stating pt will run out of these before she gets the order from mail order. She is requesting [facility].**  Prescription Request  11/30/2022  Is this a "Controlled Substance" medicine? No  LOV: 07/18/2022  What is the name of the medication or equipment?   venlafaxine (EFFEXOR) 75 MG tablet [[id number]]  Have you contacted your pharmacy to request a refill? No   Which pharmacy would you like this sent to?   [id number] [place], [place] - [address] [place] [address] Phone: [phone] Fax: [phone]    Patient notified that their request is being sent to the clinical staff for review and that they should receive a response within 2 business days.   Please advise at Mobile [phone] (mobile)

## 2022-12-01 ENCOUNTER — Other Ambulatory Visit: Payer: Self-pay | Admitting: Family Medicine

## 2022-12-01 MED ORDER — VENLAFAXINE HCL 75 MG PO TABS: 225.0000 mg | ORAL_TABLET | Freq: Every morning | ORAL | 5 refills | Status: DC

## 2023-01-02 ENCOUNTER — Emergency Department (HOSPITAL_COMMUNITY)
Admission: EM | Admit: 2023-01-02 | Discharge: 2023-01-02 | Disposition: A | Discharge status: Home / Self Care | Payer: Medicare Other | Attending: Emergency Medicine | Admitting: Emergency Medicine

## 2023-01-02 ENCOUNTER — Emergency Department (HOSPITAL_COMMUNITY): Payer: Medicare Other

## 2023-01-02 ENCOUNTER — Encounter (HOSPITAL_COMMUNITY): Payer: Self-pay

## 2023-01-02 ENCOUNTER — Other Ambulatory Visit: Payer: Self-pay

## 2023-01-02 DIAGNOSIS — R109 Unspecified abdominal pain: Secondary | ICD-10-CM | POA: Diagnosis not present

## 2023-01-02 DIAGNOSIS — S3992XA Unspecified injury of lower back, initial encounter: Secondary | ICD-10-CM | POA: Diagnosis not present

## 2023-01-02 DIAGNOSIS — Z7901 Long term (current) use of anticoagulants: Secondary | ICD-10-CM | POA: Insufficient documentation

## 2023-01-02 DIAGNOSIS — W19XXXA Unspecified fall, initial encounter: Secondary | ICD-10-CM

## 2023-01-02 DIAGNOSIS — S300XXA Contusion of lower back and pelvis, initial encounter: Secondary | ICD-10-CM | POA: Insufficient documentation

## 2023-01-02 DIAGNOSIS — Z043 Encounter for examination and observation following other accident: Secondary | ICD-10-CM | POA: Diagnosis not present

## 2023-01-02 DIAGNOSIS — M549 Dorsalgia, unspecified: Secondary | ICD-10-CM | POA: Diagnosis not present

## 2023-01-02 DIAGNOSIS — I700 Atherosclerosis of aorta: Secondary | ICD-10-CM | POA: Diagnosis not present

## 2023-01-02 DIAGNOSIS — W06XXXA Fall from bed, initial encounter: Secondary | ICD-10-CM | POA: Diagnosis not present

## 2023-01-02 DIAGNOSIS — M4856XA Collapsed vertebra, not elsewhere classified, lumbar region, initial encounter for fracture: Secondary | ICD-10-CM | POA: Diagnosis not present

## 2023-01-02 DIAGNOSIS — M4319 Spondylolisthesis, multiple sites in spine: Secondary | ICD-10-CM | POA: Diagnosis not present

## 2023-01-02 DIAGNOSIS — S20229A Contusion of unspecified back wall of thorax, initial encounter: Secondary | ICD-10-CM

## 2023-01-02 LAB — CBC WITH DIFFERENTIAL/PLATELET
Abs Immature Granulocytes: 0.06 10*3/uL (ref 0.00–0.07)
Basophils Absolute: 0.1 10*3/uL (ref 0.0–0.1)
Basophils Relative: 0 %
Eosinophils Absolute: 0.1 10*3/uL (ref 0.0–0.5)
Eosinophils Relative: 1 %
HCT: 37.5 % (ref 36.0–46.0)
Hemoglobin: 12.2 g/dL (ref 12.0–15.0)
Immature Granulocytes: 1 %
Lymphocytes Relative: 11 %
Lymphs Abs: 1.3 10*3/uL (ref 0.7–4.0)
MCH: 29.7 pg (ref 26.0–34.0)
MCHC: 32.5 g/dL (ref 30.0–36.0)
MCV: 91.2 fL (ref 80.0–100.0)
Monocytes Absolute: 0.8 10*3/uL (ref 0.1–1.0)
Monocytes Relative: 7 %
Neutro Abs: 9.6 10*3/uL — ABNORMAL HIGH (ref 1.7–7.7)
Neutrophils Relative %: 80 %
Platelets: 203 10*3/uL (ref 150–400)
RBC: 4.11 MIL/uL (ref 3.87–5.11)
RDW: 13.9 % (ref 11.5–15.5)
WBC: 12 10*3/uL — ABNORMAL HIGH (ref 4.0–10.5)
nRBC: 0 % (ref 0.0–0.2)

## 2023-01-02 LAB — URINALYSIS, ROUTINE W REFLEX MICROSCOPIC
Bilirubin Urine: NEGATIVE
Glucose, UA: NEGATIVE mg/dL
Hgb urine dipstick: NEGATIVE
Ketones, ur: NEGATIVE mg/dL
Leukocytes,Ua: NEGATIVE
Nitrite: NEGATIVE
Protein, ur: NEGATIVE mg/dL
Specific Gravity, Urine: 1.013 (ref 1.005–1.030)
pH: 6 (ref 5.0–8.0)

## 2023-01-02 LAB — LIPASE, BLOOD: Lipase: 31 U/L (ref 11–51)

## 2023-01-02 LAB — I-STAT CHEM 8, ED
BUN: 11 mg/dL (ref 8–23)
Calcium, Ion: 1.21 mmol/L (ref 1.15–1.40)
Chloride: 104 mmol/L (ref 98–111)
Creatinine, Ser: 0.9 mg/dL (ref 0.44–1.00)
Glucose, Bld: 122 mg/dL — ABNORMAL HIGH (ref 70–99)
HCT: 37 % (ref 36.0–46.0)
Hemoglobin: 12.6 g/dL (ref 12.0–15.0)
Potassium: 3.6 mmol/L (ref 3.5–5.1)
Sodium: 143 mmol/L (ref 135–145)
TCO2: 29 mmol/L (ref 22–32)

## 2023-01-02 LAB — COMPREHENSIVE METABOLIC PANEL
ALT: 23 U/L (ref 0–44)
AST: 27 U/L (ref 15–41)
Albumin: 3.9 g/dL (ref 3.5–5.0)
Alkaline Phosphatase: 89 U/L (ref 38–126)
Anion gap: 6 (ref 5–15)
BUN: 13 mg/dL (ref 8–23)
CO2: 31 mmol/L (ref 22–32)
Calcium: 9.3 mg/dL (ref 8.9–10.3)
Chloride: 105 mmol/L (ref 98–111)
Creatinine, Ser: 0.67 mg/dL (ref 0.44–1.00)
GFR, Estimated: >60 mL/min (ref >60)
Glucose, Bld: 120 mg/dL — ABNORMAL HIGH (ref 70–99)
Potassium: 3.6 mmol/L (ref 3.5–5.1)
Sodium: 142 mmol/L (ref 135–145)
Total Bilirubin: 0.6 mg/dL (ref <1.2)
Total Protein: 6.6 g/dL (ref 6.5–8.1)

## 2023-01-02 MED ORDER — LIDOCAINE 5 % EX PTCH: 1.0000 | MEDICATED_PATCH | CUTANEOUS | 0 refills | Status: DC

## 2023-01-02 MED ORDER — LIDOCAINE 5 % EX PTCH
1.0000 | MEDICATED_PATCH | CUTANEOUS | Status: DC
  Administered 2023-01-02: 1 via TRANSDERMAL
  Filled 2023-01-02: qty 1

## 2023-01-02 MED ORDER — IOHEXOL 300 MG/ML  SOLN
100.0000 mL | Freq: Once | INTRAMUSCULAR | Status: AC | PRN
  Administered 2023-01-02: 100 mL via INTRAVENOUS

## 2023-01-02 MED ORDER — ACETAMINOPHEN 325 MG PO TABS
650.0000 mg | ORAL_TABLET | Freq: Once | ORAL | Status: AC
  Administered 2023-01-02: 650 mg via ORAL
  Filled 2023-01-02: qty 2

## 2023-01-02 MED ORDER — MORPHINE SULFATE (PF) 4 MG/ML IV SOLN
4.0000 mg | Freq: Once | INTRAVENOUS | Status: AC
  Administered 2023-01-02: 4 mg via INTRAVENOUS
  Filled 2023-01-02: qty 1

## 2023-01-02 NOTE — ED Provider Notes (Signed)
[facility] EMERGENCY DEPARTMENT AT [facility] Provider Note   CSN: [id number] Arrival date & time: 01/02/23  0510     History  Chief Complaint  Patient presents with   Shannon [name]    Shannon [name] is a 77 y.o. female history of congestive dilated cardiomyopathy, osteoarthritis, vertebral fracture due to osteoporosis, vitamin D deficiency, hip fracture presented with low back pain after a fall.  Patient states she rolled out of bed accidentally this morning and landed on her back.  Patient states that she has pain in her left low back since the fall but denies any saddle anesthesia, urinary/bowel incontinence, paresthesias, new onset weakness.  Patient states she was in too much pain to try to get up and so EMS helped her up.  Patient denies any nausea vomiting, chest pain, shortness of breath, recent illnesses, nausea/vomiting, abdominal pain, dysuria/hematuria.  Patient is not taking anything for the pain yet.  Home Medications Prior to Admission medications   Medication Sig Start Date End Date Taking? Authorizing Provider  lidocaine (LIDODERM) 5 % Place 1 patch onto the skin daily. Remove & Discard patch within 12 hours or as directed by MD 01/02/23  Yes Jamarco Zaldivar, Beverly Gust, PA-C  acetaminophen (TYLENOL) 325 MG tablet Take 2 tablets (650 mg total) by mouth every 6 (six) hours as needed for mild pain or moderate pain (pain score 1-3 or temp > 100.5). 11/26/21   Rodolph Bong, MD  albuterol (VENTOLIN HFA) 108 (90 Base) MCG/ACT inhaler INHALE 1-2 PUFFS INTO THE LUNGS EVERY 6 (SIX) HOURS AS NEEDED FOR WHEEZING OR SHORTNESS OF BREATH. 02/24/21   Bradd Canary, MD  alendronate (FOSAMAX) 70 MG tablet TAKE 1 TABLET (70 MG TOTAL) BY MOUTH EVERY 7 (SEVEN) DAYS. TAKE WITH A FULL GLASS OF WATER ON AN EMPTY STOMACH. 05/22/22   Bradd Canary, MD  Calcium Carbonate (CALCIUM 600 PO) Take 1 tablet by mouth daily.    [provider]  carvedilol (COREG) 25 MG tablet TAKE 1 TABLET TWICE DAILY  WITH MEALS 07/12/22   Lewayne Bunting, MD  cetirizine (ZYRTEC) 10 MG tablet Take 10 mg by mouth at bedtime.    [provider]  cholecalciferol (VITAMIN D3) 25 MCG (1000 UNIT) tablet Take 1,000 Units by mouth daily.    [provider]  diltiazem (CARDIZEM CD) 120 MG 24 hr capsule TAKE 1 CAPSULE BY MOUTH EVERY DAY 07/26/22   Lewayne Bunting, MD  docusate sodium (COLACE) 100 MG capsule Take 1 capsule (100 mg total) by mouth 2 (two) times daily. Patient taking differently: Take 100 mg by mouth 2 (two) times daily as needed. 11/26/21   Rodolph Bong, MD  donepezil (ARICEPT) 10 MG tablet TAKE 1 TABLET AT BEDTIME 04/12/22   Wertman, Sung Amabile, PA-C  ELIQUIS 5 MG TABS tablet Take 5 mg by mouth 2 (two) times daily. 07/13/22   [provider]  famotidine (PEPCID) 20 MG tablet TAKE 1 TABLET TWICE DAILY 05/24/22   Bradd Canary, MD  ferrous sulfate 325 (65 FE) MG tablet Take 325 mg by mouth daily with breakfast.    [provider]  folic acid (FOLVITE) 1 MG tablet Take 1 tablet (1 mg total) by mouth daily. 01/19/20   Bradd Canary, MD  gabapentin (NEURONTIN) 300 MG capsule Take 1 capsule (300 mg total) by mouth 2 (two) times daily. 10/24/22   Clayborne Dana, NP  hydrocortisone 2.5 % ointment Apply topically. 10/11/22   [provider]  hydrOXYzine (ATARAX) 25 MG tablet TAKE 0.5-1 TABLETS (12.5-25 MG TOTAL) BY MOUTH 2 (TWO) TIMES DAILY AS NEEDED. Patient taking differently: Take 25 mg by mouth 2 (two) times daily as needed (allergies). 04/15/21   Bradd Canary, MD  memantine (NAMENDA) 5 MG tablet Take 1 tablet (5 mg total) by mouth at bedtime. 08/31/22   Marcos Eke, PA-C  Multiple Vitamin (MULTIVITAMIN WITH MINERALS) TABS tablet Take 1 tablet by mouth daily.    [provider]  omeprazole (PRILOSEC) 20 MG capsule TAKE 1 CAPSULE BY MOUTH EVERY DAY 10/03/22   Bradd Canary, MD  pentosan polysulfate (ELMIRON) 100 MG capsule Take 200 mg by mouth in the  morning and at bedtime. 02/07/12   Perkins, Alexzandrew L, PA-C  polyethylene glycol (MIRALAX / GLYCOLAX) 17 g packet Take 17 g by mouth daily as needed for mild constipation. 11/26/21   Rodolph Bong, MD  rosuvastatin (CRESTOR) 20 MG tablet TAKE 1 TABLET AT BEDTIME Patient taking differently: Take 20 mg by mouth at bedtime. 11/02/21   Lewayne Bunting, MD  venlafaxine (EFFEXOR) 75 MG tablet Take 3 tablets (225 mg total) by mouth in the morning. Take 225 mg by mouth in the morning. 12/01/22   Bradd Canary, MD      Allergies    Dilaudid [hydromorphone hcl]    Review of Systems   Review of Systems  Physical Exam Updated Vital Signs BP (!) 196/79 (BP Location: Left Arm)   Pulse 65   Temp 97.7 F (36.5 C) (Oral)   Resp 16   Ht 5\' 3"  (1.6 m)   Wt 76.7 kg   LMP 01/30/1994   SpO2 98%   BMI 29.95 kg/m  Physical Exam Constitutional:      General: She is not in acute distress. Cardiovascular:     Rate and Rhythm: Normal rate.     Pulses: Normal pulses.  Musculoskeletal:     Comments: 5 out of 5 bilateral hip flexion No midline tenderness or abnormalities palpated Pelvis stable Hips nontender No crepitus or bony abnormalities noted Compartments soft  Skin:    General: Skin is warm and dry.     Capillary Refill: Capillary refill takes less than 2 seconds.  Neurological:     Mental Status: She is alert.     Comments: Sensation intact distally Able to ambulate without difficulty does endorse pain when ambulating  Psychiatric:        Mood and Affect: Mood normal.     ED Results / Procedures / Treatments   Labs (all labs ordered are listed, but only abnormal results are displayed) Labs Reviewed  CBC WITH DIFFERENTIAL/PLATELET - Abnormal; Notable for the following components:      Result Value   WBC 12.0 (*)    Neutro Abs 9.6 (*)    All other components within normal limits  URINALYSIS, ROUTINE W REFLEX MICROSCOPIC - Abnormal; Notable for the following components:    APPearance HAZY (*)    All other components within normal limits  I-STAT CHEM 8, ED - Abnormal; Notable for the following components:   Glucose, Bld 122 (*)    All other components within normal limits  COMPREHENSIVE METABOLIC PANEL  LIPASE, BLOOD    EKG None  Radiology CT ABDOMEN PELVIS W CONTRAST  Result Date: 01/02/2023 CLINICAL DATA:  Abdominal pain, back pain, fall EXAM: CT ABDOMEN AND PELVIS WITH CONTRAST CT LUMBAR SPINE WITH CONTRAST TECHNIQUE: Multidetector CT imaging of the abdomen and pelvis  was performed using the standard protocol following bolus administration of intravenous contrast. Multidetector CT imaging of the lumbar spine was performed using the standard protocol following bolus administration of intravenous contrast. RADIATION DOSE REDUCTION: This exam was performed according to the departmental dose-optimization program which includes automated exposure control, adjustment of the mA and/or kV according to patient size and/or use of iterative reconstruction technique. CONTRAST:  OMNIPAQUE IOHEXOL 300 MG/ML  SOLN COMPARISON:  None Available. FINDINGS: CT ABDOMEN PELVIS FINDINGS Lower chest: No acute findings.  Coronary artery calcifications. Hepatobiliary: No focal liver abnormality is seen. Status post cholecystectomy. No biliary dilatation. Pancreas: Unremarkable. No pancreatic ductal dilatation or surrounding inflammatory changes. Spleen: Normal in size without significant abnormality. Adrenals/Urinary Tract: Adrenal glands are unremarkable. Kidneys are normal, without renal calculi, solid lesion, or hydronephrosis. Bladder is unremarkable. Stomach/Bowel: Stomach is within normal limits. Appendix appears normal. No evidence of bowel wall thickening, distention, or inflammatory changes. Vascular/Lymphatic: Aortic atherosclerosis. No enlarged abdominal or pelvic lymph nodes. Reproductive: Calcified uterine fibroids. Other: No abdominal wall hernia or abnormality. No ascites.  Musculoskeletal: No acute osseous findings. Status post left hip total arthroplasty. CT LUMBAR SPINE FINDINGS Alignment: Normal lumbar lordosis. Chronic, less than 25% degenerative anterolisthesis of L5 on S1. Vertebral bodies: Nonacute wedge deformity of T12 status post vertebral cement augmentation. No lumbar fracture or dislocation. Disc spaces: Intact. Paraspinous soft tissues: Unremarkable. IMPRESSION: 1. No acute CT findings of the abdomen or pelvis to explain abdominal pain. 2. No lumbar fracture or dislocation. 3. Nonacute wedge deformity of T12 status post vertebral cement augmentation. 4. Coronary artery disease. 5. Calcified uterine fibroids. Aortic Atherosclerosis (ICD10-I70.0). Electronically Signed   By: Jearld Lesch M.D.   On: 01/02/2023 11:31   CT L-SPINE NO CHARGE  Result Date: 01/02/2023 CLINICAL DATA:  Abdominal pain, back pain, fall EXAM: CT ABDOMEN AND PELVIS WITH CONTRAST CT LUMBAR SPINE WITH CONTRAST TECHNIQUE: Multidetector CT imaging of the abdomen and pelvis was performed using the standard protocol following bolus administration of intravenous contrast. Multidetector CT imaging of the lumbar spine was performed using the standard protocol following bolus administration of intravenous contrast. RADIATION DOSE REDUCTION: This exam was performed according to the departmental dose-optimization program which includes automated exposure control, adjustment of the mA and/or kV according to patient size and/or use of iterative reconstruction technique. CONTRAST:  OMNIPAQUE IOHEXOL 300 MG/ML  SOLN COMPARISON:  None Available. FINDINGS: CT ABDOMEN PELVIS FINDINGS Lower chest: No acute findings.  Coronary artery calcifications. Hepatobiliary: No focal liver abnormality is seen. Status post cholecystectomy. No biliary dilatation. Pancreas: Unremarkable. No pancreatic ductal dilatation or surrounding inflammatory changes. Spleen: Normal in size without significant abnormality.  Adrenals/Urinary Tract: Adrenal glands are unremarkable. Kidneys are normal, without renal calculi, solid lesion, or hydronephrosis. Bladder is unremarkable. Stomach/Bowel: Stomach is within normal limits. Appendix appears normal. No evidence of bowel wall thickening, distention, or inflammatory changes. Vascular/Lymphatic: Aortic atherosclerosis. No enlarged abdominal or pelvic lymph nodes. Reproductive: Calcified uterine fibroids. Other: No abdominal wall hernia or abnormality. No ascites. Musculoskeletal: No acute osseous findings. Status post left hip total arthroplasty. CT LUMBAR SPINE FINDINGS Alignment: Normal lumbar lordosis. Chronic, less than 25% degenerative anterolisthesis of L5 on S1. Vertebral bodies: Nonacute wedge deformity of T12 status post vertebral cement augmentation. No lumbar fracture or dislocation. Disc spaces: Intact. Paraspinous soft tissues: Unremarkable. IMPRESSION: 1. No acute CT findings of the abdomen or pelvis to explain abdominal pain. 2. No lumbar fracture or dislocation. 3. Nonacute wedge deformity of T12 status post vertebral  cement augmentation. 4. Coronary artery disease. 5. Calcified uterine fibroids. Aortic Atherosclerosis (ICD10-I70.0). Electronically Signed   By: Jearld Lesch M.D.   On: 01/02/2023 11:31   DG Lumbar Spine Complete  Result Date: 01/02/2023 CLINICAL DATA:  77 year old female status post fall with low back pain. EXAM: LUMBAR SPINE - COMPLETE 4+ VIEW COMPARISON:  Lumbar radiographs 02/10/2020.  CTA chest 11/29/2021. FINDINGS: Chronic T11 and T12 compression fractures, T12 chronically augmented. Normal lumbar segmentation. Chronically exaggerated lumbar lordosis. Mild chronic L1 anterior wedging is stable, could be congenital. L2 through L5 appears stable and intact. Chronic anterolisthesis of L5 on S1 with vacuum disc there. Lesser chronic anterolisthesis of L4-L5 with chronic disc and facet degeneration there also. Grossly intact visible sacrum. Partially  visible left hip arthroplasty. No acute osseous abnormality identified. Chronic right abdominal surgical clips. Nonobstructed bowel-gas pattern. Dystrophic calcification in the central pelvis, probably a fibroid calcification. Calcified aortic atherosclerosis. IMPRESSION: 1. No acute osseous abnormality identified in the Lumbar Spine. 2. Chronic T11 and T12 compression fractures, T12 chronically augmented. 3. Advanced chronic lower lumbar degeneration with chronic anterolisthesis at L4-L5 and L5-S1. 4.  Aortic Atherosclerosis (ICD10-I70.0). Electronically Signed   By: Odessa Fleming M.D.   On: 01/02/2023 08:23    Procedures Procedures    Medications Ordered in ED Medications  lidocaine (LIDODERM) 5 % 1 patch (1 patch Transdermal Patch Applied 01/02/23 0729)  acetaminophen (TYLENOL) tablet 650 mg (650 mg Oral Given 01/02/23 0732)  morphine (PF) 4 MG/ML injection 4 mg (4 mg Intravenous Given 01/02/23 0927)  iohexol (OMNIPAQUE) 300 MG/ML solution 100 mL (100 mLs Intravenous Contrast Given 01/02/23 1049)    ED Course/ Medical Decision Making/ A&P                                 Medical Decision Making Amount and/or Complexity of Data Reviewed Labs: ordered. Radiology: ordered.  Risk OTC drugs. Prescription drug management.   Shannon [name] 77 y.o. presented today for fall. Working DDx that I considered at this time includes, but not limited to, vasovagal episode, mechanical fall, ICH, epidural/subdural hematoma, basilar skull fracture, anemia, electrolyte abnormalities, drug-induced, arrhythmia, UTI, fracture, contusion, soft tissue injury.  R/o DDx: vasovagal episode, ICH, epidural/subdural hematoma, basilar skull fracture, anemia, electrolyte abnormalities, drug-induced, arrhythmia, UTI, fracture, contusion, soft tissue injury, cauda equina, spinal cord pathology, intra-abdominal hemorrhage, bony metastasis: These are considered less likely due to history of present illness, physical exam,  lab/imaging findings  Review of prior external notes: 11/28/2022 office visit  Unique Tests and My Interpretation:  CBC: Mild leukocytosis CMP: Unremarkable Vasectomy: Unremarkable Lipase: Unremarkable UA: Unremarkable Lumbar x-ray: Unremarkable CT abdomen pelvis contrast: No acute findings CT L-spine: No acute findings  Social Determinants of Health: none  Discussion with Independent Historian: None  Discussion of Management of Tests: None  Risk: Medium: prescription drug management  Risk Stratification Score: None  Staffed with Schlossman, MD  Plan: On exam patient was in no acute distress with stable vitals.  On exam patient does have ecchymosis in the left paralumbar region with some point tenderness however no midline tenderness or abnormalities.  The rest patient's visit was unremarkable.  Patient is not endorsing any red flag symptoms and was able to ambulate to the bathroom without difficulty.  Patient was given pain meds and will follow-up on lumbar x-ray from triage.  If negative will most likely do CT scan is patient does have history of vertebral  fracture secondary to her osteoporosis along with her vitamin D deficiency.  Patient denies hitting her head and denies any headache or neck pain and does not have any tenderness or neurologic abnormalities so at this time we will get imaging of the head and neck area.  Patient was able to ambulate to the bathroom however this did exacerbate her back pain.  X-ray was negative for any acute findings however when I went to go reevaluate the patient patient started complaining of generalized abdominal pain and did have generalized guarding as well.  Patient still complaining about back pain as well and so we will give pain meds and obtain labs and further imaging.  Labs and imaging were all reassuring.  Patient has been walking around the emergency department with a steady gait and states the pain is gone better.  At this time I  believe the patient has back contusion and will treat with Tylenol every 6 hours along with lidocaine patches and encouraged follow-up with her primary care provider.  At time of discharge patient's blood pressure did become slightly elevated however patient is endorsing a chest pain, shortness of breath, vision changes or headache that necessitate workup at this time.  This is most likely due to patient not be able take her blood pressure meds this morning so I recommend that she continues her blood pressure medications when she gets home.  Patient was given return precautions. Patient stable for discharge at this time.  Patient verbalized understanding of plan.  This chart was dictated using voice recognition software.  Despite best efforts to proofread,  errors can occur which can change the documentation meaning.         Final Clinical Impression(s) / ED Diagnoses Final diagnoses:  Fall, initial encounter  Contusion of back, unspecified laterality, initial encounter    Rx / DC Orders ED Discharge Orders          Ordered    lidocaine (LIDODERM) 5 %  Every 24 hours        01/02/23 1154              Remi Deter 01/02/23 1213    Alvira Monday, MD 01/03/23 0017

## 2023-01-02 NOTE — Discharge Instructions (Addendum)
Please follow-up with your primary care provider in regards to his symptoms and ER visit.  Today your labs and imaging are reassuring and most likely have a bruise in your back causing your pain.  You may take Tylenol every 6 hours needed for pain however have also prescribed you lidocaine patches as well.  Your blood pressure was also slightly elevated here today however this is due to not being able to take your blood pressure meds and so when you get home please take your blood pressure meds as prescribed.  Please monitor your symptoms and if symptoms change or worsen please return to the ER.

## 2023-01-02 NOTE — ED Triage Notes (Signed)
Pt BIB EMS with reports of a fall. Pt was trying to get out of bed, lost her balance, slipped and fell into some wooden steps by her bed, pt has pain to her left flank.

## 2023-01-02 NOTE — ED Notes (Signed)
Pt discharged home. Discharge information discussed. No s/s of distress observed during discharge. 

## 2023-01-09 IMAGING — DX DG LUMBAR SPINE 2-3V
3 series · 3 of 3 positions shown · non-contrast
Comparison: April 05, 2018

CLINICAL DATA: Worsening chronic low back pain.

EXAM:
LUMBAR SPINE - 2-3 VIEW

[l-spine ap]
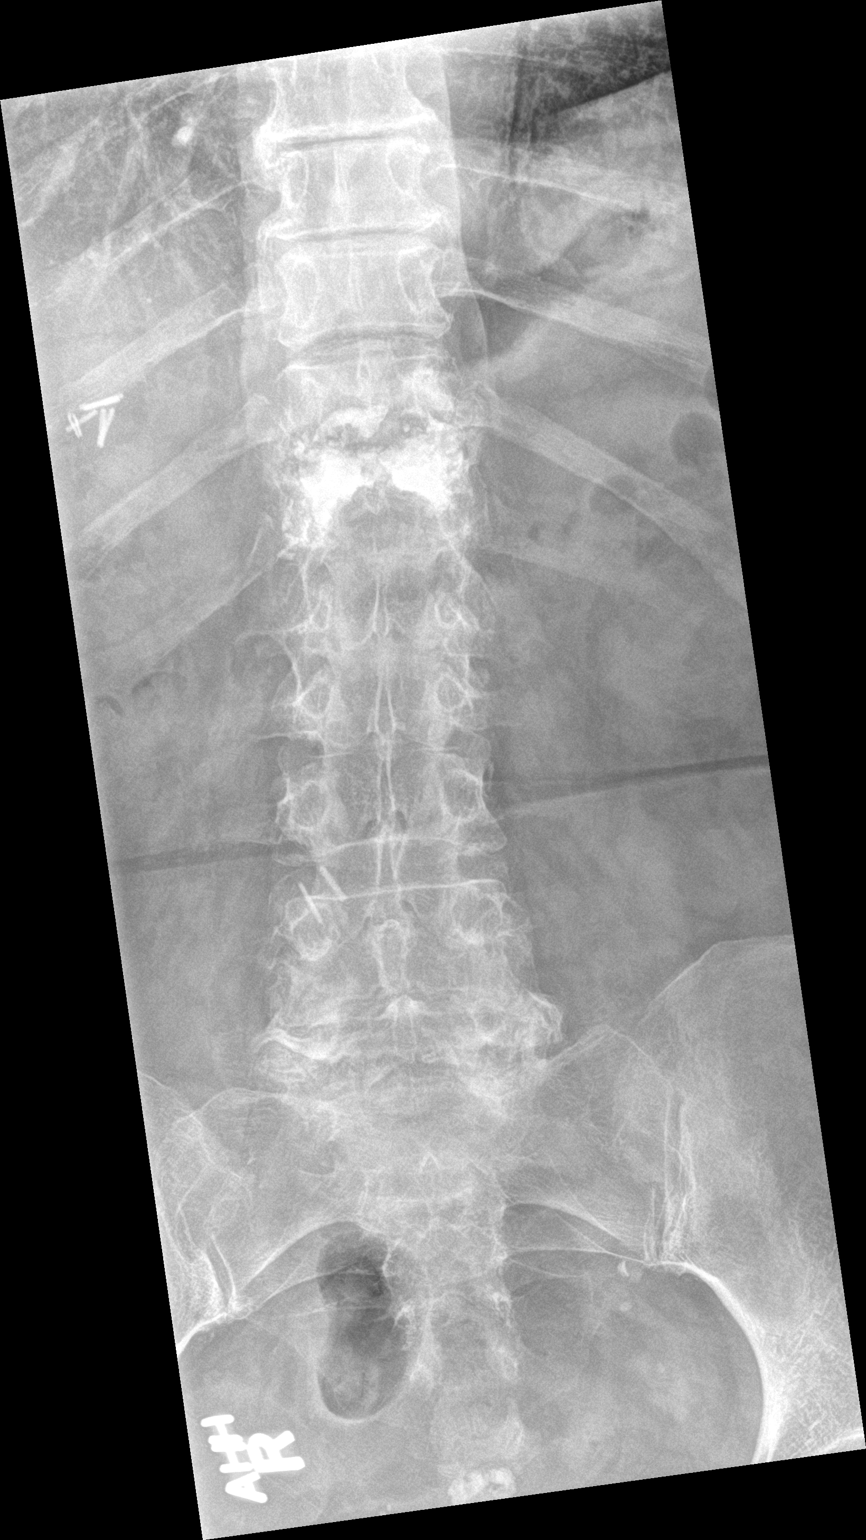

[l-spine lat]
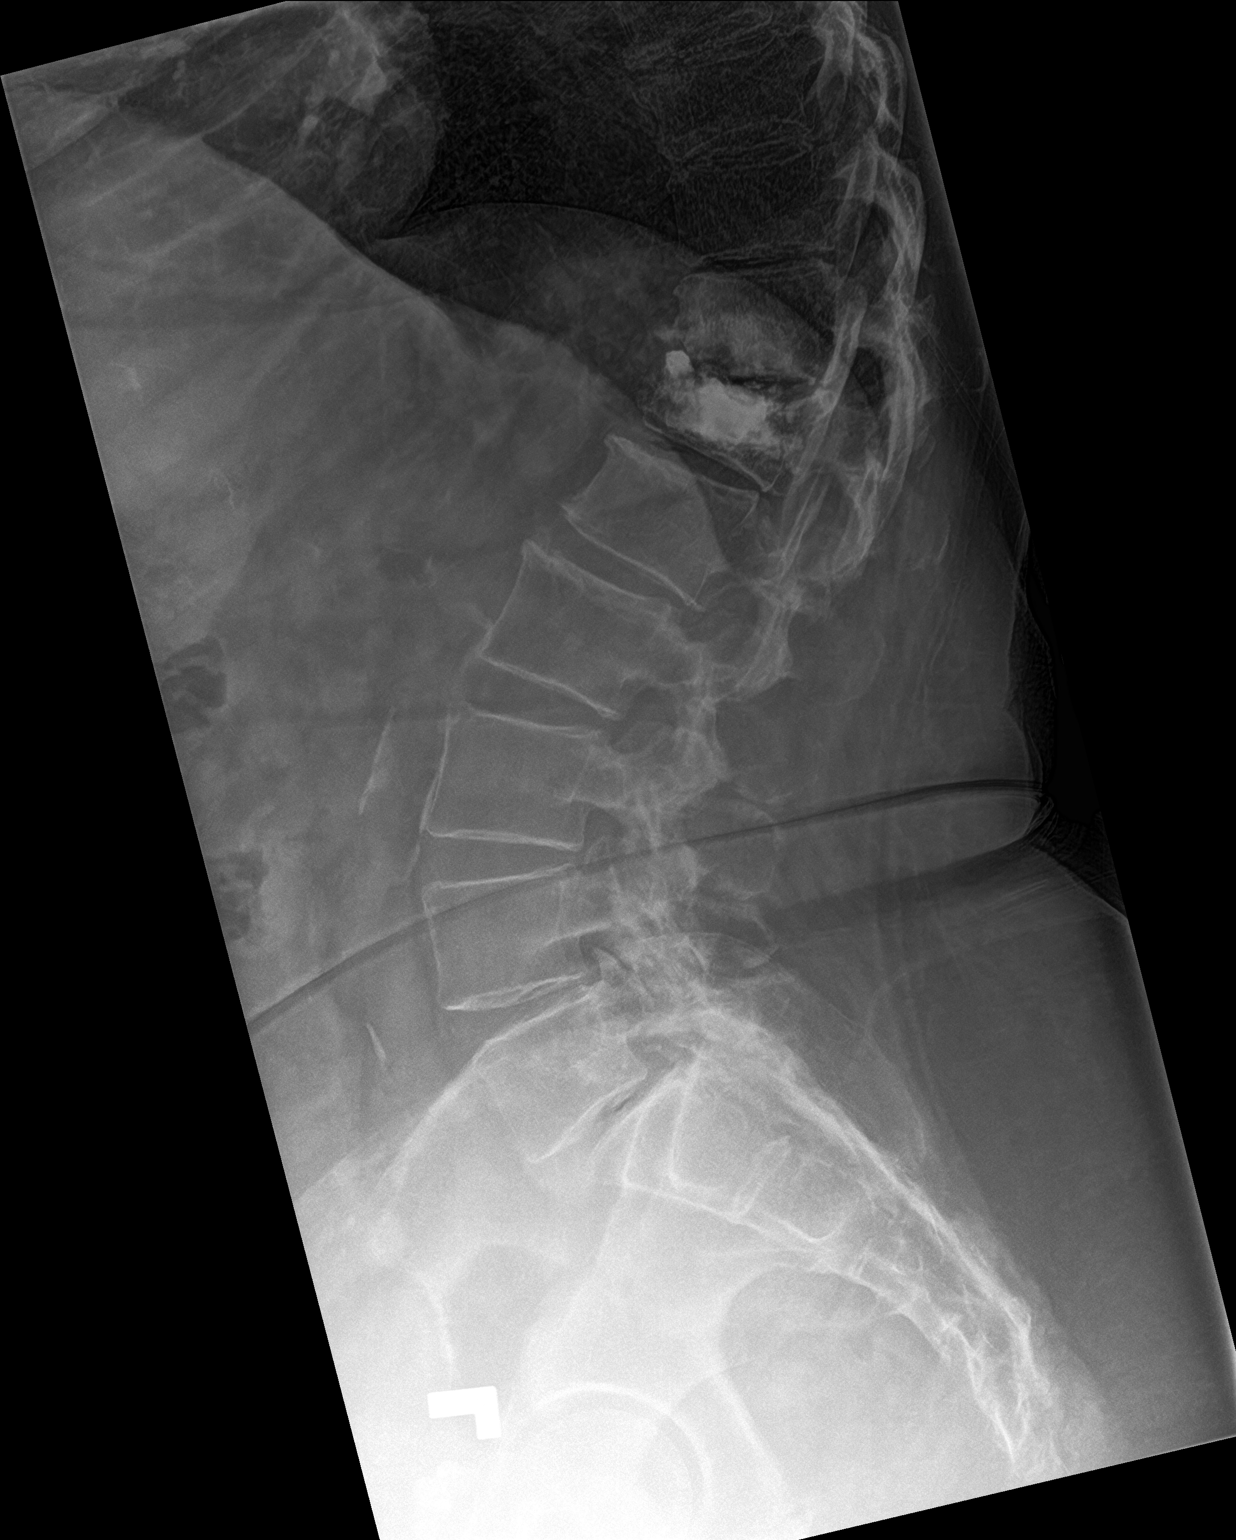

[l-spine spot]
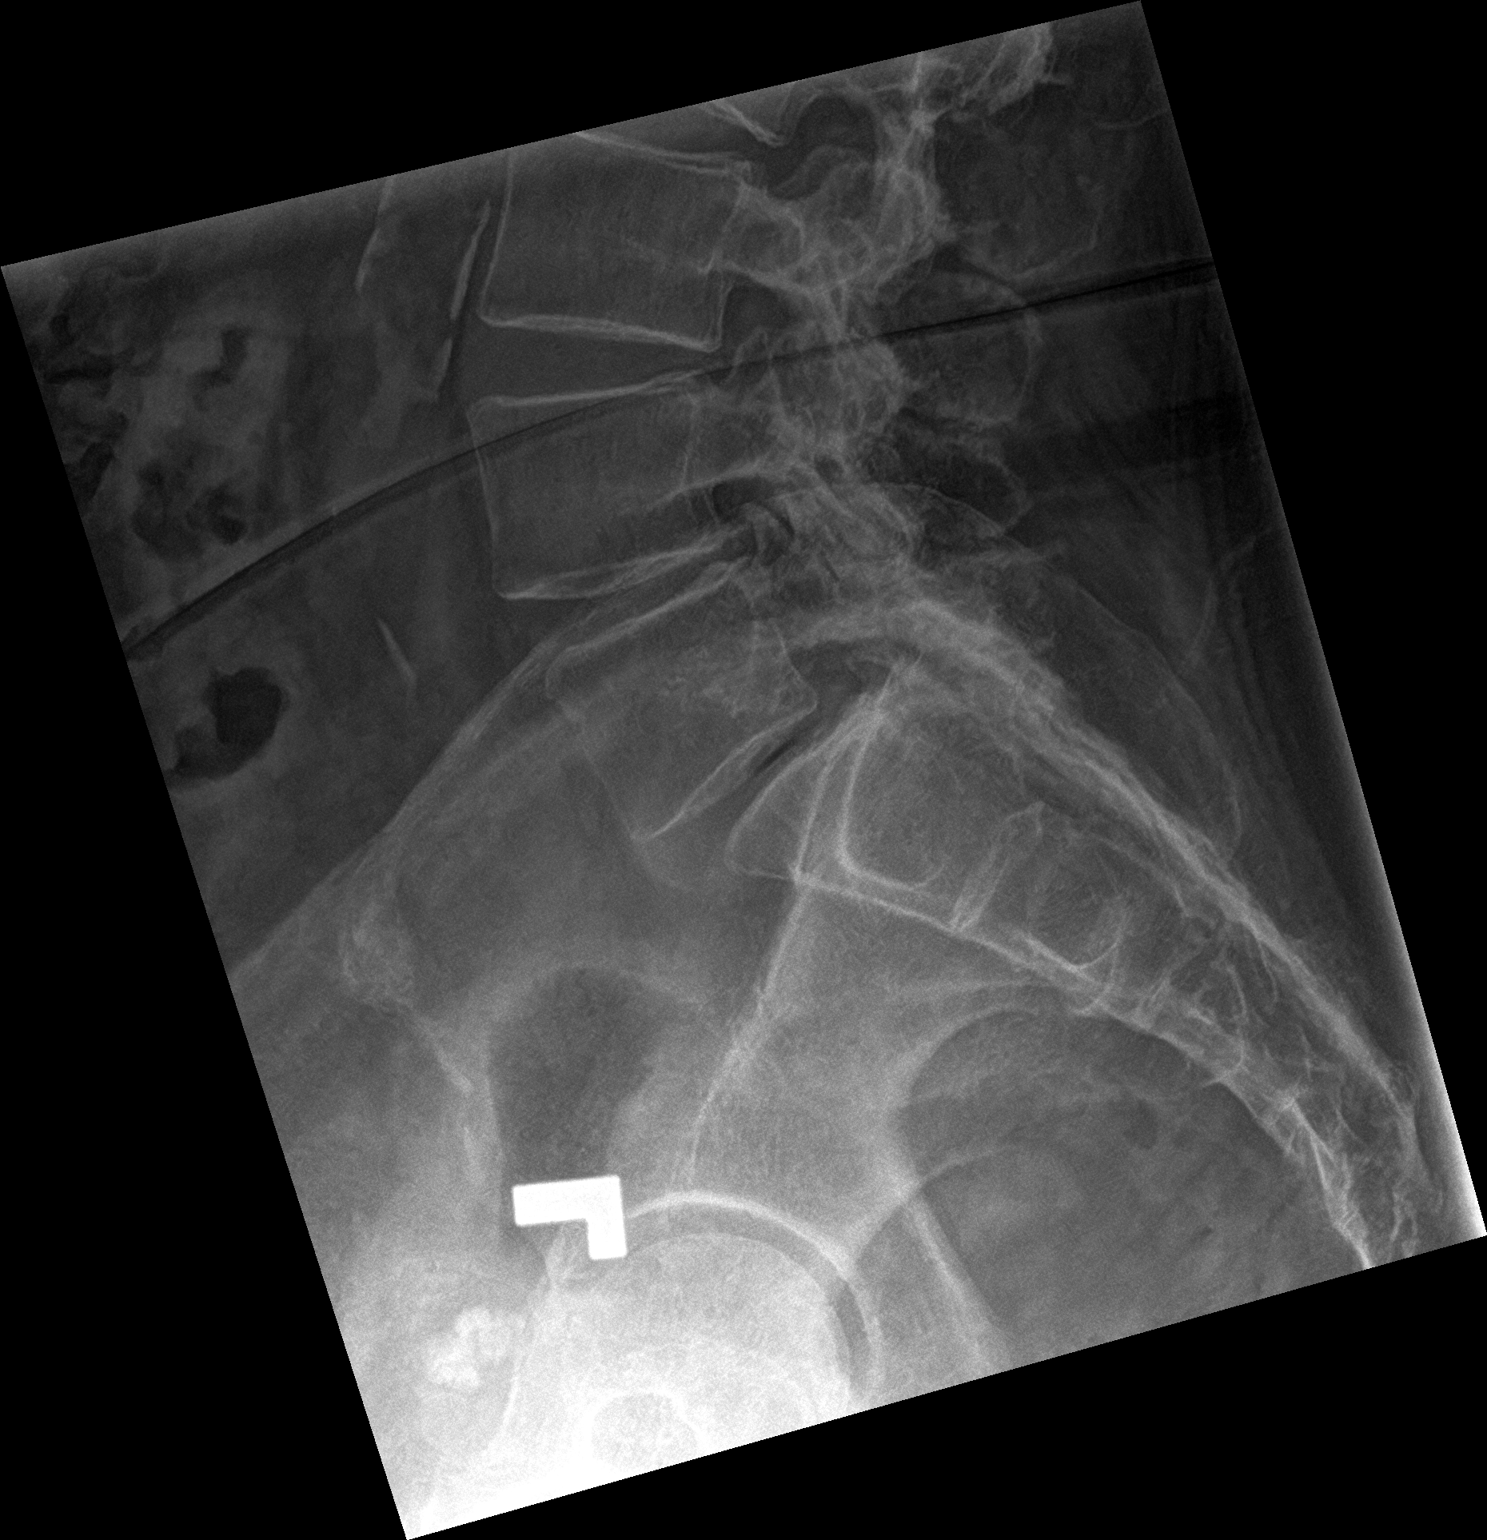

[3 of 3 positions shown; findings below may reference images not displayed]

FINDINGS: The patient has undergone prior T12 vertebral augmentation. There
are advanced degenerative changes at the T11-T12 level. Moderate to
severe multilevel disc height loss is noted throughout the lumbar
spine, greatest in the lower lumbar segments. Again noted is a
degenerative anterolisthesis of L5 on S1 measuring approximately 9
mm. Advanced facet arthrosis is noted in the lower lumbar segments.
Aortic calcifications are again noted.
IMPRESSION: 1. No acute osseous abnormality.
2. Multilevel degenerative changes as above.
3. Status post T12 vertebral augmentation.
4. Stable degenerative anterolisthesis of L5 on S1. No significant
interval change.

## 2023-01-13 IMAGING — MR MR LUMBAR SPINE W/O CM
6 series · 48 of 48 positions shown · non-contrast
Comparison: Lumbar spine MRI 03/11/2016

CLINICAL DATA: Low back pain with increased fracture risk

EXAM:
MRI LUMBAR SPINE WITHOUT CONTRAST
TECHNIQUE: Multiplanar, multisequence MR imaging of the lumbar spine was
performed. No intravenous contrast was administered.

[Series 3: T2 · sagittal · 4.0mm · 1.02mm/px · 5 of 15 slices shown (1 of 2)]
[im 1/15]
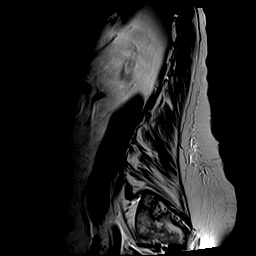
[im 4/15]
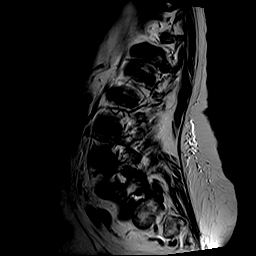
[im 8/15]
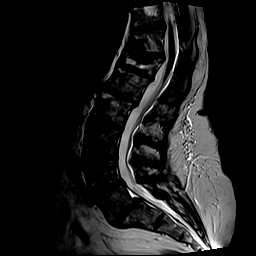
[im 11/15]
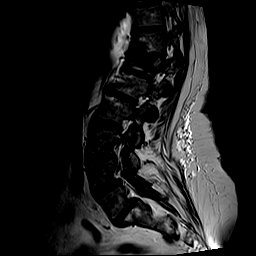
[im 15/15]
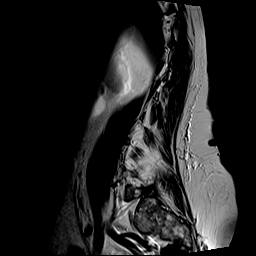

[Series 4: STIR · sagittal · 4.0mm · 1.02mm/px · 5 of 15 slices shown]
[im 1/15]
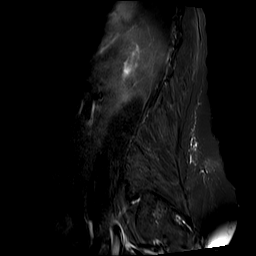
[im 4/15]
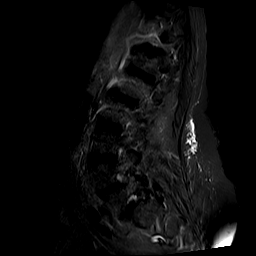
[im 8/15]
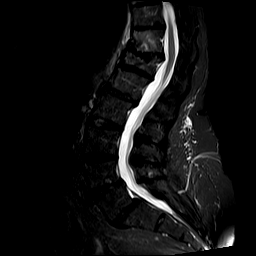
[im 11/15]
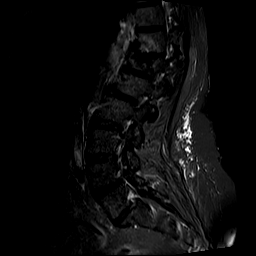
[im 15/15]
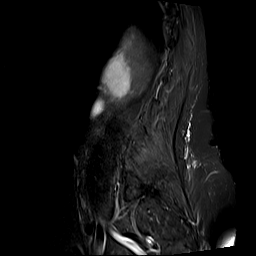

[Series 5: T1 · sagittal · 4.0mm · 1.02mm/px · 5 of 15 slices shown (1 of 2)]
[im 1/15]
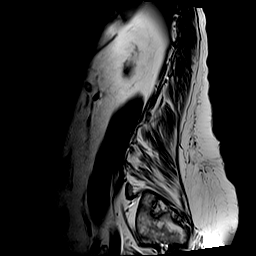
[im 4/15]
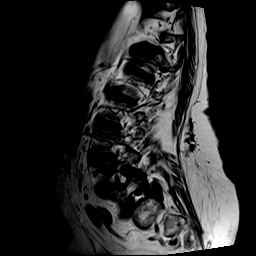
[im 8/15]
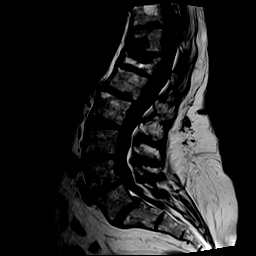
[im 11/15]
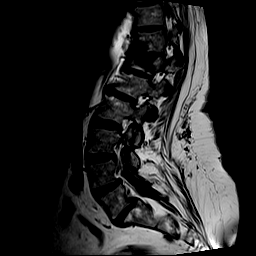
[im 15/15]
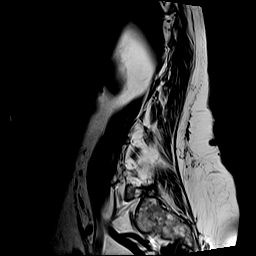

[Series 6: T2 · axial · 4.0mm · 0.78mm/px · z∈[-33,+185]mm · 14 of 40 slices shown (2 of 2)]
[im 1/40]
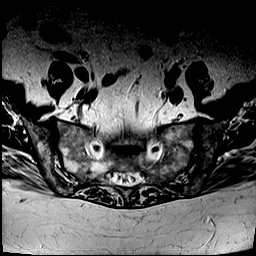
[im 4/40]
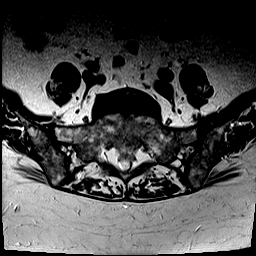
[im 7/40]
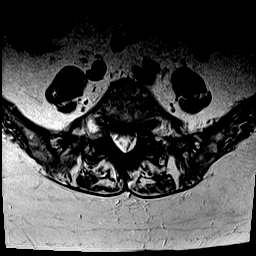
[im 10/40]
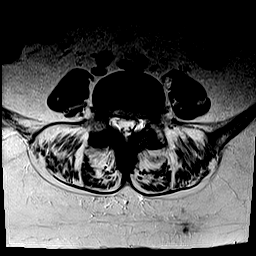
[im 13/40]
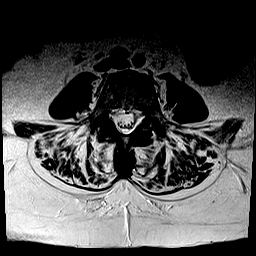
[im 16/40]
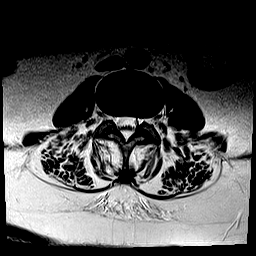
[im 19/40]
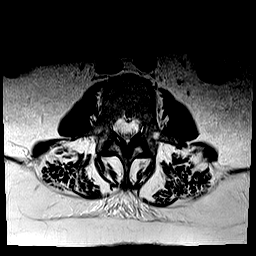
[im 22/40]
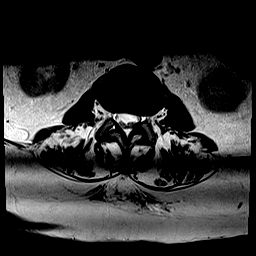
[im 25/40]
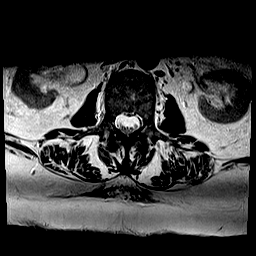
[im 28/40]
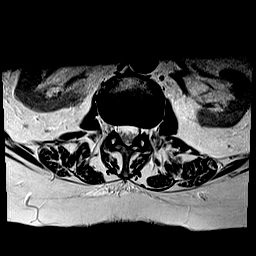
[im 31/40]
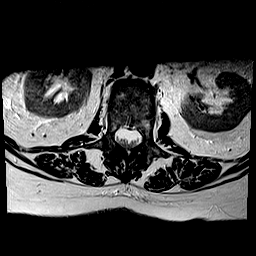
[im 34/40]
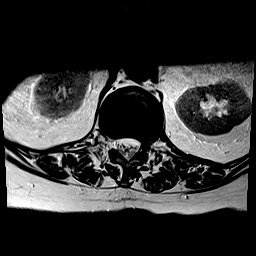
[im 37/40]
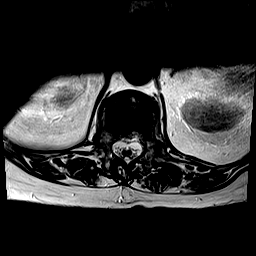
[im 40/40]
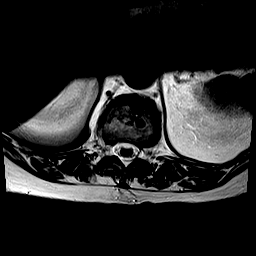

[Series 7: T1 · axial · 4.0mm · 0.78mm/px · z∈[-33,+185]mm · 14 of 40 slices shown (2 of 2)]
[im 1/40]
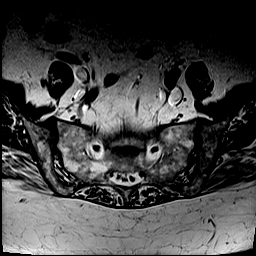
[im 4/40]
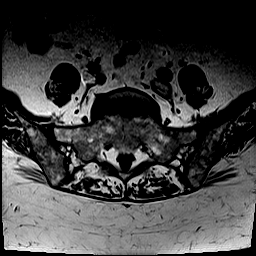
[im 7/40]
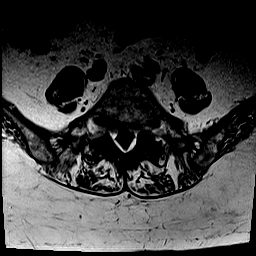
[im 10/40]
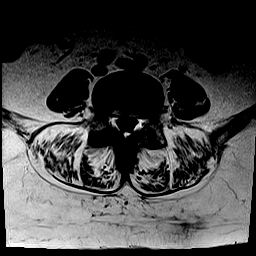
[im 13/40]
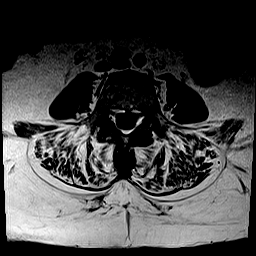
[im 16/40]
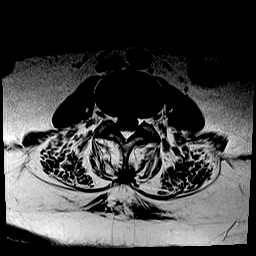
[im 19/40]
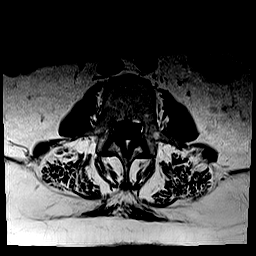
[im 22/40]
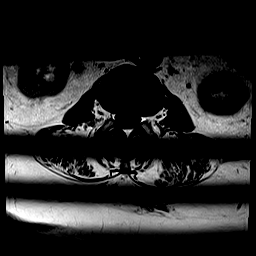
[im 25/40]
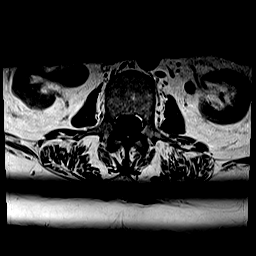
[im 28/40]
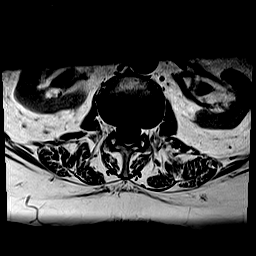
[im 31/40]
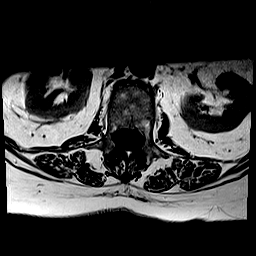
[im 34/40]
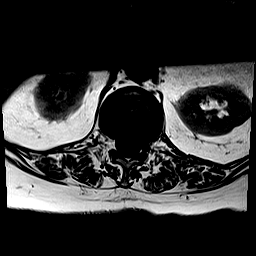
[im 37/40]
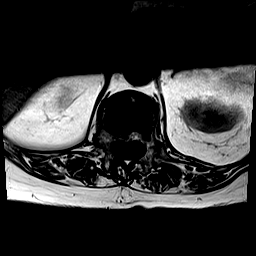
[im 40/40]
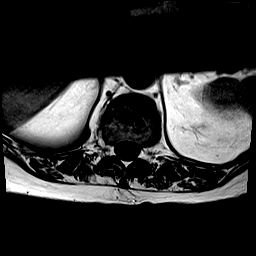

[Series 100: hx · axial · 8.0mm · 0.70mm/px · z∈[-5,+194]mm · 5 of 16 slices shown]
[im 1/16]
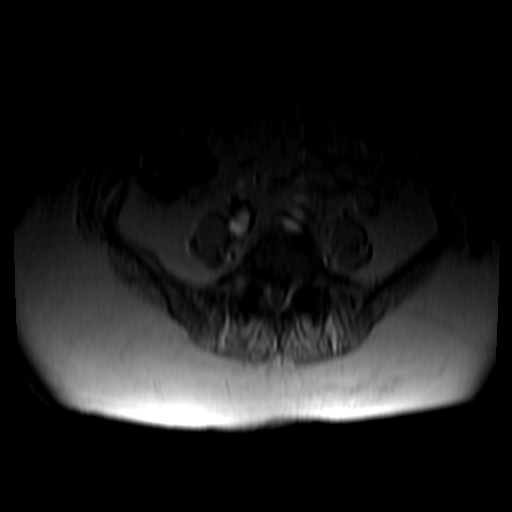
[im 4/16]
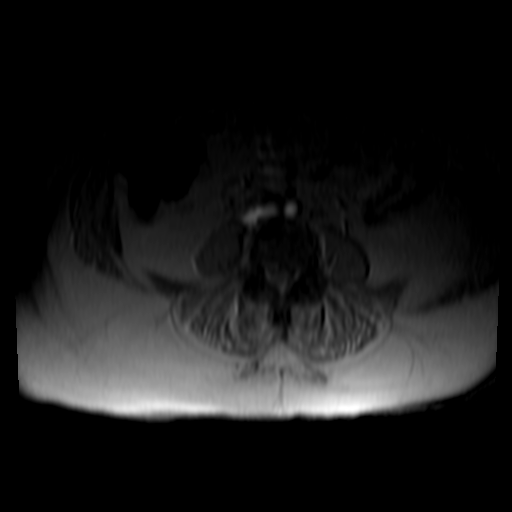
[im 8/16]
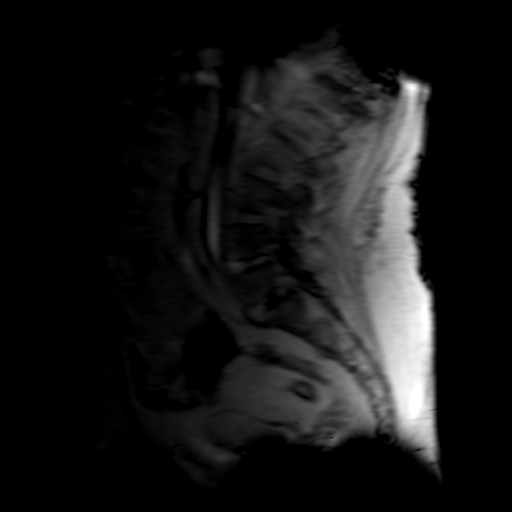
[im 12/16]
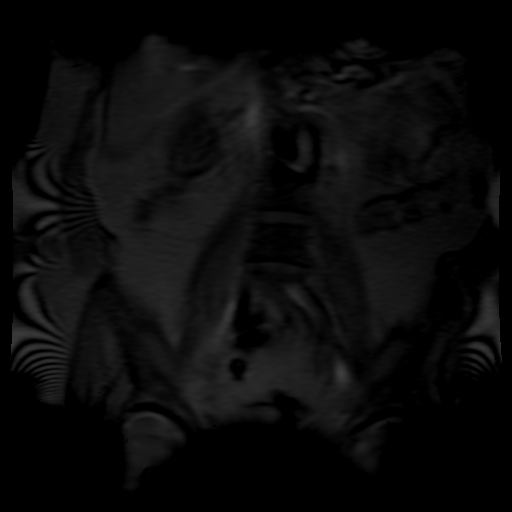
[im 16/16]
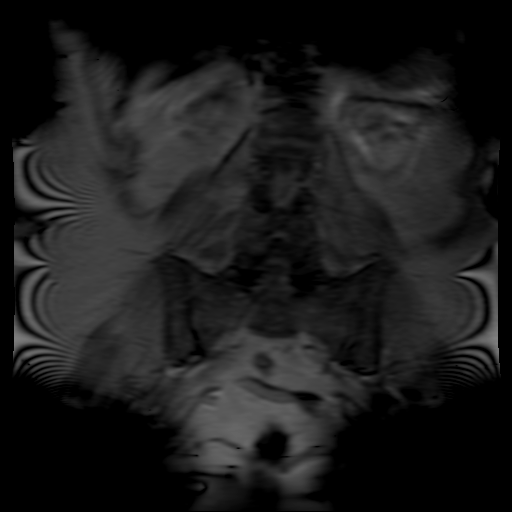

[48 of 48 positions shown; findings below may reference images not displayed]

FINDINGS: Segmentation:  Standard

Alignment: Grade 1 retrolisthesis at L1-2. Grade 1 anterolisthesis
at L4-5 and L5-S1.

Vertebrae: There is vertebroplasty material at T11-12. There is
progression of height loss at T12 with associated remodeling of the
anterior inferior endplate of T11. There is no acute fracture.

Conus medullaris and cauda equina: Conus extends to the L1 level.
Conus and cauda equina appear normal.

Paraspinal and other soft tissues: Negative

Disc levels:

T11-12: Posterior displacement of the disc and superior posterior
endplate of T12 mildly narrows the ventral thecal sac. There is no
spinal canal or neural foraminal stenosis.

T12-L1: Small disc bulge.  Mild left foraminal stenosis.

L1-L2: Small disc bulge. Spinal canal stenosis. No neural foraminal
stenosis.

L2-L3: Normal disc space and facet joints. No spinal canal stenosis.
No neural foraminal stenosis.

L3-L4: Normal disc space and facet joints. No spinal canal stenosis.
No neural foraminal stenosis.

L4-L5: Moderate facet hypertrophy with disc uncovering. No spinal
canal stenosis. No neural foraminal stenosis.

L5-S1: Severe facet hypertrophy with disc uncovering. Progression of
moderate bilateral neural foraminal stenosis. No spinal canal
stenosis.

Visualized sacrum: Normal.
IMPRESSION: 1. Progression of moderate bilateral L5-S1 neural foraminal stenosis
due to combination of severe facet arthrosis and listhesis.
2. Moderate L4-5 facet arthrosis with grade 1 anterolisthesis.
3. No spinal canal stenosis.

## 2023-01-18 ENCOUNTER — Other Ambulatory Visit: Payer: Self-pay | Admitting: Family Medicine

## 2023-01-19 ENCOUNTER — Telehealth: Payer: Self-pay

## 2023-01-19 NOTE — Progress Notes (Signed)
Transition Care Management Unsuccessful Follow-up Telephone Call  Date of discharge and from where:  01/02/2023 [facility]  Attempts:  1st Attempt  Reason for unsuccessful TCM follow-up call:  No answer/busy  Shannon [name] [facility]  [facility], [facility] Resource Care Guide Direct Dial: [phone]  Website: [email or web address]

## 2023-01-19 NOTE — Progress Notes (Signed)
Transition Care Management Unsuccessful Follow-up Telephone Call  Date of discharge and from where:  01/02/2023 [facility]  Attempts:  2nd Attempt  Reason for unsuccessful TCM follow-up call:  No answer/busy  Tye Juarez [facility]  [facility], [facility] Resource Care Guide Direct Dial: [phone]  Website: [email or web address]

## 2023-01-26 ENCOUNTER — Other Ambulatory Visit: Payer: Self-pay

## 2023-01-30 ENCOUNTER — Telehealth: Payer: Self-pay | Admitting: Family Medicine

## 2023-01-30 NOTE — Telephone Encounter (Signed)
 Copied from CRM [id number]. Topic: Medicare AWV >> Jan 30, 2023 10:00 AM Nathanel DEL wrote: Reason for CRM: Called 01/30/2023 to sched AWV - NO VOICEMAIL  Nathanel Paschal; Care Guide Ambulatory Clinical Support [place] [facility] Direct Dial: [phone]

## 2023-02-01 ENCOUNTER — Emergency Department (HOSPITAL_COMMUNITY): Payer: Medicare Other

## 2023-02-01 ENCOUNTER — Emergency Department (HOSPITAL_COMMUNITY)
Admission: EM | Admit: 2023-02-01 | Discharge: 2023-02-02 | Disposition: A | Discharge status: Home / Self Care | Payer: Medicare Other | Attending: Emergency Medicine | Admitting: Emergency Medicine

## 2023-02-01 ENCOUNTER — Other Ambulatory Visit: Payer: Self-pay

## 2023-02-01 DIAGNOSIS — S12101A Unspecified nondisplaced fracture of second cervical vertebra, initial encounter for closed fracture: Secondary | ICD-10-CM | POA: Diagnosis not present

## 2023-02-01 DIAGNOSIS — S064X0A Epidural hemorrhage without loss of consciousness, initial encounter: Secondary | ICD-10-CM | POA: Diagnosis not present

## 2023-02-01 DIAGNOSIS — G309 Alzheimer's disease, unspecified: Secondary | ICD-10-CM | POA: Diagnosis not present

## 2023-02-01 DIAGNOSIS — Z7901 Long term (current) use of anticoagulants: Secondary | ICD-10-CM | POA: Diagnosis not present

## 2023-02-01 DIAGNOSIS — Z853 Personal history of malignant neoplasm of breast: Secondary | ICD-10-CM | POA: Insufficient documentation

## 2023-02-01 DIAGNOSIS — F0390 Unspecified dementia without behavioral disturbance: Secondary | ICD-10-CM | POA: Insufficient documentation

## 2023-02-01 DIAGNOSIS — R350 Frequency of micturition: Secondary | ICD-10-CM | POA: Diagnosis not present

## 2023-02-01 DIAGNOSIS — Z79899 Other long term (current) drug therapy: Secondary | ICD-10-CM | POA: Insufficient documentation

## 2023-02-01 DIAGNOSIS — S129XXA Fracture of neck, unspecified, initial encounter: Secondary | ICD-10-CM | POA: Diagnosis not present

## 2023-02-01 DIAGNOSIS — M4802 Spinal stenosis, cervical region: Secondary | ICD-10-CM | POA: Diagnosis not present

## 2023-02-01 DIAGNOSIS — W06XXXA Fall from bed, initial encounter: Secondary | ICD-10-CM | POA: Diagnosis not present

## 2023-02-01 DIAGNOSIS — I10 Essential (primary) hypertension: Secondary | ICD-10-CM | POA: Insufficient documentation

## 2023-02-01 DIAGNOSIS — S0990XA Unspecified injury of head, initial encounter: Secondary | ICD-10-CM | POA: Diagnosis present

## 2023-02-01 DIAGNOSIS — R519 Headache, unspecified: Secondary | ICD-10-CM | POA: Diagnosis not present

## 2023-02-01 DIAGNOSIS — S12100A Unspecified displaced fracture of second cervical vertebra, initial encounter for closed fracture: Secondary | ICD-10-CM | POA: Diagnosis not present

## 2023-02-01 DIAGNOSIS — M4312 Spondylolisthesis, cervical region: Secondary | ICD-10-CM | POA: Diagnosis not present

## 2023-02-01 DIAGNOSIS — S12120A Other displaced dens fracture, initial encounter for closed fracture: Secondary | ICD-10-CM | POA: Diagnosis not present

## 2023-02-01 LAB — COMPREHENSIVE METABOLIC PANEL
ALT: 16 U/L (ref 0–44)
AST: 38 U/L (ref 15–41)
Albumin: 4.2 g/dL (ref 3.5–5.0)
Alkaline Phosphatase: 108 U/L (ref 38–126)
Anion gap: 13 (ref 5–15)
BUN: 12 mg/dL (ref 8–23)
CO2: 24 mmol/L (ref 22–32)
Calcium: 9.5 mg/dL (ref 8.9–10.3)
Chloride: 103 mmol/L (ref 98–111)
Creatinine, Ser: 0.95 mg/dL (ref 0.44–1.00)
GFR, Estimated: >60 mL/min (ref >60)
Glucose, Bld: 104 mg/dL — ABNORMAL HIGH (ref 70–99)
Potassium: 3.8 mmol/L (ref 3.5–5.1)
Sodium: 140 mmol/L (ref 135–145)
Total Bilirubin: 0.9 mg/dL (ref 0.0–1.2)
Total Protein: 7.3 g/dL (ref 6.5–8.1)

## 2023-02-01 LAB — I-STAT CHEM 8, ED
BUN: 14 mg/dL (ref 8–23)
Calcium, Ion: 1.21 mmol/L (ref 1.15–1.40)
Chloride: 104 mmol/L (ref 98–111)
Creatinine, Ser: 0.8 mg/dL (ref 0.44–1.00)
Glucose, Bld: 104 mg/dL — ABNORMAL HIGH (ref 70–99)
HCT: 41 % (ref 36.0–46.0)
Hemoglobin: 13.9 g/dL (ref 12.0–15.0)
Potassium: 3.4 mmol/L — ABNORMAL LOW (ref 3.5–5.1)
Sodium: 144 mmol/L (ref 135–145)
TCO2: 27 mmol/L (ref 22–32)

## 2023-02-01 LAB — CBC WITH DIFFERENTIAL/PLATELET
Abs Immature Granulocytes: 0.04 10*3/uL (ref 0.00–0.07)
Basophils Absolute: 0.1 10*3/uL (ref 0.0–0.1)
Basophils Relative: 1 %
Eosinophils Absolute: 0.3 10*3/uL (ref 0.0–0.5)
Eosinophils Relative: 4 %
HCT: 40.6 % (ref 36.0–46.0)
Hemoglobin: 13 g/dL (ref 12.0–15.0)
Immature Granulocytes: 1 %
Lymphocytes Relative: 22 %
Lymphs Abs: 1.6 10*3/uL (ref 0.7–4.0)
MCH: 29.1 pg (ref 26.0–34.0)
MCHC: 32 g/dL (ref 30.0–36.0)
MCV: 90.8 fL (ref 80.0–100.0)
Monocytes Absolute: 0.7 10*3/uL (ref 0.1–1.0)
Monocytes Relative: 10 %
Neutro Abs: 4.5 10*3/uL (ref 1.7–7.7)
Neutrophils Relative %: 62 %
Platelets: 283 10*3/uL (ref 150–400)
RBC: 4.47 MIL/uL (ref 3.87–5.11)
RDW: 14 % (ref 11.5–15.5)
WBC: 7.2 10*3/uL (ref 4.0–10.5)
nRBC: 0 % (ref 0.0–0.2)

## 2023-02-01 LAB — PROTIME-INR
INR: 1.1 (ref 0.8–1.2)
Prothrombin Time: 14.1 s (ref 11.4–15.2)

## 2023-02-01 MED ORDER — LORAZEPAM 1 MG PO TABS
0.5000 mg | ORAL_TABLET | ORAL | Status: DC | PRN
  Administered 2023-02-02: 0.5 mg via ORAL
  Filled 2023-02-01: qty 1

## 2023-02-01 MED ORDER — IOHEXOL 350 MG/ML SOLN
60.0000 mL | Freq: Once | INTRAVENOUS | Status: AC | PRN
  Administered 2023-02-01: 60 mL via INTRAVENOUS

## 2023-02-01 NOTE — ED Triage Notes (Addendum)
 Pt presents POV, pt fell out of bed and landed on carpet 5 days ago complains of pain to right side of head and neck. Denies any nausea or vomiting. Occasional dizziness. Pt went to urgent care and was advised to come to ER for evaluation. Denies LOC. Not on blood thinners.

## 2023-02-01 NOTE — ED Notes (Signed)
(  [phone], Wilkie Aye, daughter, would like to be kept updated.

## 2023-02-01 NOTE — ED Provider Notes (Signed)
 [facility] Provider Note   CSN: [id number] Arrival date & time: 02/01/23  1904     History  Chief Complaint  Patient presents with   Headache   Fall   Hx provided by the patient with the assistance of her daughter Shannon [name]. Also accompanied by her husband.  Shannon [name] is a 78 y.o. female with history of dementia who presents 5 days after mechanical fall onto bedroom floor while climbing into bed. Complaining of right neck pain. No N/V; intermittent dizziness. No weakness or sensation changes. Taking tylenol  and aleve at home with some improvement.  Directed to the ED by urgent care.   Hx of osteoporosis, HTN, hyperlipidemia, NICM, GERD, breast cancer not currently treated, Alzheimer's. Level 5 caveat due to alzheimer's. Per daughter, patient has not been on Eliquis  for a few months.  HPI     Home Medications Prior to Admission medications   Medication Sig Start Date End Date Taking? Authorizing Provider  acetaminophen  (TYLENOL ) 325 MG tablet Take 2 tablets (650 mg total) by mouth every 6 (six) hours as needed for mild pain or moderate pain (pain score 1-3 or temp > 100.5). 11/26/21   Sebastian Toribio GAILS, MD  albuterol  (VENTOLIN  HFA) 108 [phone] Base) MCG/ACT inhaler INHALE 1-2 PUFFS INTO THE LUNGS EVERY 6 (SIX) HOURS AS NEEDED FOR WHEEZING OR SHORTNESS OF BREATH. 02/24/21   Domenica Harlene LABOR, MD  alendronate  (FOSAMAX ) 70 MG tablet TAKE 1 TABLET (70 MG TOTAL) BY MOUTH EVERY 7 (SEVEN) DAYS. TAKE WITH A FULL GLASS OF WATER  ON AN EMPTY STOMACH. 05/22/22   Domenica Harlene LABOR, MD  Calcium  Carbonate (CALCIUM  600 PO) Take 1 tablet by mouth daily.    [provider]  carvedilol  (COREG ) 25 MG tablet TAKE 1 TABLET TWICE DAILY WITH MEALS 07/12/22   Pietro Redell RAMAN, MD  cetirizine (ZYRTEC) 10 MG tablet Take 10 mg by mouth at bedtime.    [provider]  cholecalciferol  (VITAMIN D3) 25 MCG (1000 UNIT) tablet Take 1,000 Units by mouth daily.     [provider]  diltiazem  (CARDIZEM  CD) 120 MG 24 hr capsule TAKE 1 CAPSULE BY MOUTH EVERY DAY 07/26/22   Pietro Redell RAMAN, MD  docusate sodium  (COLACE) 100 MG capsule Take 1 capsule (100 mg total) by mouth 2 (two) times daily. Patient taking differently: Take 100 mg by mouth 2 (two) times daily as needed. 11/26/21   Sebastian Toribio GAILS, MD  donepezil  (ARICEPT ) 10 MG tablet TAKE 1 TABLET AT BEDTIME 04/12/22   [name], PA-C  ELIQUIS  5 MG TABS tablet Take 5 mg by mouth 2 (two) times daily. 07/13/22   [provider]  famotidine  (PEPCID ) 20 MG tablet TAKE 1 TABLET TWICE DAILY 05/24/22   Domenica Harlene LABOR, MD  ferrous sulfate  325 (65 FE) MG tablet Take 325 mg by mouth daily with breakfast.    [provider]  folic acid  (FOLVITE ) 1 MG tablet Take 1 tablet (1 mg total) by mouth daily. 01/19/20   Domenica Harlene LABOR, MD  gabapentin  (NEURONTIN ) 300 MG capsule Take 1 capsule (300 mg total) by mouth 2 (two) times daily. 10/24/22   Almarie Waddell NOVAK, NP  hydrocortisone 2.5 % ointment Apply topically. 10/11/22   [provider]  hydrOXYzine  (ATARAX ) 25 MG tablet TAKE 0.5-1 TABLETS (12.5-25 MG TOTAL) BY MOUTH 2 (TWO) TIMES DAILY AS NEEDED. Patient taking differently: Take 25 mg by mouth 2 (two) times daily as needed (allergies). 04/15/21  Domenica Harlene LABOR, MD  lidocaine  (LIDODERM ) 5 % Place 1 patch onto the skin daily. Remove & Discard patch within 12 hours or as directed by MD 01/02/23   Victor Lynwood DASEN, PA-C  memantine  (NAMENDA ) 5 MG tablet Take 1 tablet (5 mg total) by mouth at bedtime. 08/31/22   Wertman, Sara E, PA-C  Multiple Vitamin (MULTIVITAMIN WITH MINERALS) TABS tablet Take 1 tablet by mouth daily.    [provider]  omeprazole  (PRILOSEC) 20 MG capsule TAKE 1 CAPSULE BY MOUTH EVERY DAY 10/03/22   Domenica Harlene LABOR, MD  pentosan polysulfate (ELMIRON ) 100 MG capsule Take 200 mg by mouth in the morning and at bedtime. 02/07/12   Perkins, Alexzandrew L, PA-C  polyethylene  glycol (MIRALAX  / GLYCOLAX ) 17 g packet Take 17 g by mouth daily as needed for mild constipation. 11/26/21   Sebastian Toribio GAILS, MD  rosuvastatin  (CRESTOR ) 20 MG tablet TAKE 1 TABLET AT BEDTIME Patient taking differently: Take 20 mg by mouth at bedtime. 11/02/21   Pietro Redell RAMAN, MD  venlafaxine  (EFFEXOR ) 75 MG tablet Take 3 tablets (225 mg total) by mouth in the morning. 01/18/23   Domenica Harlene LABOR, MD      Allergies    Dilaudid  [hydromorphone  hcl]    Review of Systems   Review of Systems  Musculoskeletal:  Positive for neck pain.    Physical Exam Updated Vital Signs BP (!) 173/160 (BP Location: Left Arm)   Pulse 82   Temp 98.3 F (36.8 C) (Oral)   Resp 19   Ht 5' 3 (1.6 m)   Wt 76 kg   LMP 01/30/1994   SpO2 96%   BMI 29.68 kg/m  Physical Exam Vitals and nursing note reviewed.  Constitutional:      Appearance: She is not ill-appearing or toxic-appearing.     Interventions: Cervical collar in place.  HENT:     Head: Normocephalic and atraumatic.     Mouth/Throat:     Mouth: Mucous membranes are moist.     Pharynx: No oropharyngeal exudate or posterior oropharyngeal erythema.  Eyes:     General:        Right eye: No discharge.        Left eye: No discharge.     Extraocular Movements: Extraocular movements intact.     Conjunctiva/sclera: Conjunctivae normal.     Pupils: Pupils are equal, round, and reactive to light.  Neck:     Trachea: Trachea and phonation normal.     Comments: Cervical collar in place Cardiovascular:     Rate and Rhythm: Normal rate and regular rhythm.     Pulses: Normal pulses.  Pulmonary:     Effort: Pulmonary effort is normal. No respiratory distress.     Breath sounds: Normal breath sounds. No wheezing or rales.  Abdominal:     General: Bowel sounds are normal. There is no distension.     Palpations: Abdomen is soft.     Tenderness: There is no abdominal tenderness.  Musculoskeletal:        General: No deformity.     Cervical back:  Spinous process tenderness present.  Skin:    General: Skin is warm and dry.     Capillary Refill: Capillary refill takes less than 2 seconds.  Neurological:     General: No focal deficit present.     Mental Status: She is alert and oriented to person, place, and time. Mental status is at baseline.     GCS: GCS eye  subscore is 4. GCS verbal subscore is 5. GCS motor subscore is 6.     Sensory: Sensation is intact.     Motor: Motor function is intact.     Coordination: Coordination is intact.     Gait: Gait is intact.  Psychiatric:        Mood and Affect: Mood normal.     ED Results / Procedures / Treatments   Labs (all labs ordered are listed, but only abnormal results are displayed) Labs Reviewed  COMPREHENSIVE METABOLIC PANEL - Abnormal; Notable for the following components:      Result Value   Glucose, Bld 104 (*)    All other components within normal limits  I-STAT CHEM 8, ED - Abnormal; Notable for the following components:   Potassium 3.4 (*)    Glucose, Bld 104 (*)    All other components within normal limits  CBC WITH DIFFERENTIAL/PLATELET  PROTIME-INR    EKG None  Radiology MR Cervical Spine Wo Contrast Result Date: 02/02/2023 CLINICAL DATA:  Spine fracture, cervical, traumatic cervical fracture EXAM: MRI CERVICAL SPINE WITHOUT CONTRAST TECHNIQUE: Multiplanar, multisequence MR imaging of the cervical spine was performed. No intravenous contrast was administered. COMPARISON:  None Available. FINDINGS: Alignment: Slight anterolisthesis of the dens is unchanged compared to same day CT cervical spine. No new malalignment. Vertebrae: Known fractures better characterized on same day CT. Cord: Normal cord signal. Posterior Fossa, vertebral arteries, paraspinal tissues: Vertebral artery flow voids are maintained. No evidence of acute abnormality in the visualized posterior fossa on limited assessment. Disc levels: There is disruption of the anterior longitudinal ligament at the  site of dens fracture (for example see series 5, image 7). Additionally at the craniocervical junction there is abnormal edema with suspected injury of the apical ligament (series 5, image 8 and stretched anterior atlanto-occipital membrane. Edema in the inter spinous ligaments at C1-C2 and C2-C3 as well as at the atlantooccipital level (series 7, image 7), compatible with injury. Small volume of traumatic upper prevertebral edema. Epidural hemorrhage in the right lateral canal at C1-C2 without significant canal stenosis. Small disc bulges at multiple levels with mild canal stenosis at C4-C5 and C5-C6. Foraminal stenosis appears greatest at C5-C6 and likely is moderate on the right although motion limits assessment. IMPRESSION: 1. Disruption of the anterior longitudinal ligament at the site of dens fracture. 2. Suspected injury of the apical ligament and stretched anterior atlanto-occipital membrane. 3. Injury to the interspinous ligaments from the occiput to C3. 4. Epidural hemorrhage in the right lateral canal at C1-C2 without significant canal stenosis. 5. Small volume of traumatic upper prevertebral edema. 6. Known fractures better characterized on same day CT cervical spine. Electronically Signed   By: Gilmore GORMAN Molt M.D.   On: 02/02/2023 02:38   CT Angio Neck W and/or Wo Contrast Result Date: 02/02/2023 CLINICAL DATA:  unstable C-spine fracture EXAM: CT ANGIOGRAPHY NECK TECHNIQUE: Multidetector CT imaging of the neck was performed using the standard protocol during bolus administration of intravenous contrast. Multiplanar CT image reconstructions and MIPs were obtained to evaluate the vascular anatomy. Carotid stenosis measurements (when applicable) are obtained utilizing NASCET criteria, using the distal internal carotid diameter as the denominator. RADIATION DOSE REDUCTION: This exam was performed according to the departmental dose-optimization program which includes automated exposure control,  adjustment of the mA and/or kV according to patient size and/or use of iterative reconstruction technique. CONTRAST:  60mL OMNIPAQUE  IOHEXOL  350 MG/ML SOLN COMPARISON:  None Available. FINDINGS: Aortic arch: Great vessel origins are  patent. Right carotid system: No evidence of dissection, stenosis (50% or greater) or occlusion. Retropharyngeal course of the ICA. Left carotid system: No evidence of dissection, stenosis (50% or greater) or occlusion. Vertebral arteries: Codominant. No evidence of dissection, stenosis (50% or greater) or occlusion. Skeleton: Known cervical spine fractures better characterized on same day CT of the cervical spine. Other neck: Clear. No obvious acute abnormality on limited assessment. Upper chest: Visualized lung apices are clear. IMPRESSION: 1. No evidence of acute arterial injury in the neck. 2. No large vessel occlusion or hemodynamically significant stenosis in the neck. 3. Known cervical spine fractures better characterized on same day CT of the cervical spine. Electronically Signed   By: Gilmore GORMAN Molt M.D.   On: 02/02/2023 00:31   CT Cervical Spine Wo Contrast Result Date: 02/01/2023 CLINICAL DATA:  fall EXAM: CT HEAD WITHOUT CONTRAST CT CERVICAL SPINE WITHOUT CONTRAST TECHNIQUE: Multidetector CT imaging of the head and cervical spine was performed following the standard protocol without intravenous contrast. Multiplanar CT image reconstructions of the cervical spine were also generated. RADIATION DOSE REDUCTION: This exam was performed according to the departmental dose-optimization program which includes automated exposure control, adjustment of the mA and/or kV according to patient size and/or use of iterative reconstruction technique. COMPARISON:  CT head and CT cervical spine FINDINGS: CT HEAD FINDINGS Motion limited study.  Within this limitation: Brain: No evidence of acute infarction, hemorrhage, hydrocephalus, extra-axial collection or mass lesion/mass effect.  Vascular: No obvious hyperdense vessel. Skull: No acute fracture.  Prior right-sided craniotomy. Sinuses/Orbits: Clear sinuses.  No acute orbital findings. Other: No mastoid effusions. CT CERVICAL SPINE FINDINGS Alignment: Slight (1 mm) anterior displacement of the dens due to the fracture detailed below. Otherwise, no substantial sagittal subluxation. Skull base and vertebrae: Acute comminuted fracture involving the right C2 lateral mass with extension through the adjacent transverse foramen, extension to the right lateral plantar axial joint, and as well as across the base of the dens (type III dens fracture). There is slight (1 mm) anterior displacement of the dens. Soft tissues and spinal canal: Suspected small volume of traumatic prevertebral edema in the upper cervical spine. No visible large canal hematoma. Disc levels: Similar multilevel degenerative change including disc bulges. Upper chest: The lung apices are not imaged. IMPRESSION: CT cervical spine: 1. Acute comminuted fracture involving the right C2 lateral mass with extension through the adjacent transverse foramen as well as across the base of the dens (type III dens fracture). There is slight (1 mm) anterior displacement of the dens. 2. Recommend neurosurgical consultation and CTA neck to assess for vertebral artery injury. 3. Suspected small volume of traumatic prevertebral edema. Recommend MRI of the cervical spine to assess for ligamentous injury. CT head: Significantly motion limited study without obvious evidence of acute intracranial abnormality. Findings and recommendations discussed with provider Templeton via telephone at 9:24 p.m. Electronically Signed   By: Gilmore GORMAN Molt M.D.   On: 02/01/2023 21:26   CT Head Wo Contrast Result Date: 02/01/2023 CLINICAL DATA:  fall EXAM: CT HEAD WITHOUT CONTRAST CT CERVICAL SPINE WITHOUT CONTRAST TECHNIQUE: Multidetector CT imaging of the head and cervical spine was performed following the standard  protocol without intravenous contrast. Multiplanar CT image reconstructions of the cervical spine were also generated. RADIATION DOSE REDUCTION: This exam was performed according to the departmental dose-optimization program which includes automated exposure control, adjustment of the mA and/or kV according to patient size and/or use of iterative reconstruction technique. COMPARISON:  CT head and CT  cervical spine FINDINGS: CT HEAD FINDINGS Motion limited study.  Within this limitation: Brain: No evidence of acute infarction, hemorrhage, hydrocephalus, extra-axial collection or mass lesion/mass effect. Vascular: No obvious hyperdense vessel. Skull: No acute fracture.  Prior right-sided craniotomy. Sinuses/Orbits: Clear sinuses.  No acute orbital findings. Other: No mastoid effusions. CT CERVICAL SPINE FINDINGS Alignment: Slight (1 mm) anterior displacement of the dens due to the fracture detailed below. Otherwise, no substantial sagittal subluxation. Skull base and vertebrae: Acute comminuted fracture involving the right C2 lateral mass with extension through the adjacent transverse foramen, extension to the right lateral plantar axial joint, and as well as across the base of the dens (type III dens fracture). There is slight (1 mm) anterior displacement of the dens. Soft tissues and spinal canal: Suspected small volume of traumatic prevertebral edema in the upper cervical spine. No visible large canal hematoma. Disc levels: Similar multilevel degenerative change including disc bulges. Upper chest: The lung apices are not imaged. IMPRESSION: CT cervical spine: 1. Acute comminuted fracture involving the right C2 lateral mass with extension through the adjacent transverse foramen as well as across the base of the dens (type III dens fracture). There is slight (1 mm) anterior displacement of the dens. 2. Recommend neurosurgical consultation and CTA neck to assess for vertebral artery injury. 3. Suspected small volume  of traumatic prevertebral edema. Recommend MRI of the cervical spine to assess for ligamentous injury. CT head: Significantly motion limited study without obvious evidence of acute intracranial abnormality. Findings and recommendations discussed with provider Templeton via telephone at 9:24 p.m. Electronically Signed   By: Gilmore GORMAN Molt M.D.   On: 02/01/2023 21:26    Procedures .Critical Care  Performed by: Bobette Pleasant SAUNDERS, PA-C Authorized by: Bobette Pleasant SAUNDERS, PA-C   Critical care provider statement:    Critical care time (minutes):  45   Critical care was time spent personally by me on the following activities:  Development of treatment plan with patient or surrogate, discussions with consultants, evaluation of patient's response to treatment, examination of patient, obtaining history from patient or surrogate, ordering and performing treatments and interventions, ordering and review of laboratory studies, ordering and review of radiographic studies, pulse oximetry and re-evaluation of patient's condition     Medications Ordered in ED Medications  LORazepam  (ATIVAN ) tablet 0.5 mg (0.5 mg Oral Given 02/02/23 0127)  iohexol  (OMNIPAQUE ) 350 MG/ML injection 60 mL (60 mLs Intravenous Contrast Given 02/01/23 2349)    ED Course/ Medical Decision Making/ A&P Clinical Course as of 02/02/23 0400  Thu Feb 01, 2023  2349 Consult Camie Pickle neurosurg PA, who states that if patient has reassuring CTA, and remains neurointact can be discharged home with OP follow up. Does recommend proceeding with MR.  [RS]  Fri Feb 02, 2023  9651 Repeat consult to Camie Pickle, Neurosurg PA who has reviewed the images and states patient is safe to discharge home at this time with close outpatient follow up. Cervical collar is to remain in place at all times. It is not to be removed until neurosurgery directs her to do so. I appreciate her collaboration in the care of this patient.  [RS]    Clinical  Course User Index [RS] Advay Volante, Pleasant SAUNDERS, PA-C                                 Medical Decision Making 78 year old female with mechanical fall with right-sided neck pain, 5  days after fall.  Hypertensive on intake, vitals otherwise normal.  Cardiopulmonary exam unremarkable, abdominal exam is benign.  Patient nonfocal neurologic exam.  Is in cervical collar for immobilization.  Amount and/or Complexity of Data Reviewed Labs: ordered.    Details: CBC without leukocytosis or anemia, CMP unremarkable, INR is normal.   Radiology: ordered.    Details:   CT head motion limited but no evidence of acute intracranial abnormality.  Unfortunately cervical CT cervical spine revealed comminuted fracture involving the right C2 lateral mass extending to the transverse foramen as well as a type III dens fracture with 1 mm anterior displacement of the dens.  Small volume traumatic prevertebral edema.  CTA and MRI of the C-spine recommended. MRI with extensive ligamentous injury as well as epidural hemorrhage in the right lateral canal at C1 to.  Small volume traumatic upper prevertebral edema  CTA negative for acute arterial injury.  MR with extensive ligamentous injury and epidural hemorrhage; traumatic prevertebral edema.    Risk Prescription drug management.   Patient remains neurologically intact, ambulating to and from the bathroom multiple times independently with throughout her stay in the emergency department.  Clinically and per neurosurgery recommendations, patient is safe to be discharged home at this time.  Explicit instructions to leave the cervical collar in place at all times were discussed with both the patient and with her daughter Shannon [name] via phone.  Recommend close outpatient follow-up with neurosurgery.  Clinical concern for emergent complication that would warrant further ED workup or inpatient management is exceedingly low.  Daron voiced understanding of her medical evaluation  and treatment plan. Each of their questions answered to their expressed satisfaction.  Return precautions were given.  Patient is well-appearing, stable, and was discharged in good condition.  This chart was dictated using voice recognition software, Dragon. Despite the best efforts of this provider to proofread and correct errors, errors may still occur which can change documentation meaning.        Final Clinical Impression(s) / ED Diagnoses Final diagnoses:  Other closed displaced odontoid fracture, initial encounter [facility])    Rx / DC Orders ED Discharge Orders     None         Bobette Pleasant SAUNDERS, PA-C 02/02/23 0400    Mesner, Selinda, MD 02/03/23 [phone]

## 2023-02-01 NOTE — ED Notes (Signed)
 Pt called multiple times and no an answer, moved to OTF.

## 2023-02-02 ENCOUNTER — Emergency Department (HOSPITAL_COMMUNITY): Payer: Medicare Other

## 2023-02-02 DIAGNOSIS — S12100A Unspecified displaced fracture of second cervical vertebra, initial encounter for closed fracture: Secondary | ICD-10-CM | POA: Diagnosis not present

## 2023-02-02 DIAGNOSIS — M4802 Spinal stenosis, cervical region: Secondary | ICD-10-CM | POA: Diagnosis not present

## 2023-02-02 DIAGNOSIS — M4312 Spondylolisthesis, cervical region: Secondary | ICD-10-CM | POA: Diagnosis not present

## 2023-02-02 DIAGNOSIS — S064X0A Epidural hemorrhage without loss of consciousness, initial encounter: Secondary | ICD-10-CM | POA: Diagnosis not present

## 2023-02-02 DIAGNOSIS — S12120A Other displaced dens fracture, initial encounter for closed fracture: Secondary | ICD-10-CM | POA: Diagnosis not present

## 2023-02-02 NOTE — Discharge Instructions (Addendum)
 Patient seen here today for her neck pain after fall.  She has multiple broken bones in her neck as well as injuries to the ligaments in her neck which are likely contributing to her pain.  She will need to remain in the cervical collar AT ALL TIMES. It is not to be removed for any reason unless directed to do so by neurosurgical provider. She may continue to use over the counter medications for her pain. Return to the ER with any new numbness, tingling, weakness in her extremities, dizziness, or any other new severe symptoms.

## 2023-02-02 NOTE — ED Notes (Signed)
 PT to MRI

## 2023-02-05 ENCOUNTER — Telehealth: Payer: Self-pay | Admitting: *Deleted

## 2023-02-05 NOTE — Transitions of Care (Post Inpatient/ED Visit) (Signed)
   02/05/2023  Name: STEPHANEY STEVEN MRN: [id number] DOB: 1945/11/12  Today's TOC FU Call Status: Today's TOC FU Call Status:: Unsuccessful Call (1st Attempt) Unsuccessful Call (1st Attempt) Date: 02/05/23  Attempted to reach the patient regarding the most recent Inpatient/ED visit.  Follow Up Plan: Additional outreach attempts will be made to reach the patient to complete the Transitions of Care (Post Inpatient/ED visit) call.   Signature Yaileen Hofferber, [facility]

## 2023-02-06 ENCOUNTER — Telehealth: Payer: Self-pay | Admitting: *Deleted

## 2023-02-06 NOTE — Transitions of Care (Post Inpatient/ED Visit) (Signed)
   02/06/2023  Name: SUSA BONES MRN: [id number] DOB: [birth year]  Today's TOC FU Call Status: Today's TOC FU Call Status:: Unsuccessful Call (2nd Attempt) Unsuccessful Call (2nd Attempt) Date: 02/06/23  Attempted to reach the patient regarding the most recent Inpatient/ED visit.  Follow Up Plan: No further outreach attempts will be made at this time. We have been unable to contact the patient.  Signature Vinayak Bobier, [facility]

## 2023-02-18 ENCOUNTER — Other Ambulatory Visit: Payer: Self-pay | Admitting: Cardiology

## 2023-02-18 DIAGNOSIS — R002 Palpitations: Secondary | ICD-10-CM

## 2023-03-05 ENCOUNTER — Ambulatory Visit: Payer: Medicare Other | Admitting: Physician Assistant

## 2023-03-05 NOTE — Progress Notes (Incomplete)
Assessment/Plan:   Mild cognitive impairment likely due to Alzheimer's disease.  Shannon [name] is a very pleasant 78 y.o. RH female with a history of GAD and depression followed by behavioral health, vitamin D deficiency, nonischemic cardiomyopathy, hypertension, hyperlipidemia, history of PE on AC, chronic diastolic CHF, prediabetes  presenting today in follow-up for evaluation of memory loss. Patient is on donepezil 10 mg daily.***     Recommendations:   Follow up in   months. Continue donepezil 10 mg daily, side effects discussed Repeat neuropsych evaluation is scheduled for May 2025 for diagnostic clarity and disease trajectory Continue memantine 5 mg nightly, side effects discussed*** Recommend good control of cardiovascular risk factors Continue using CPAP for OSA Continue to control mood as per PCP and psychiatry for GAD    Subjective:   This patient is accompanied in the office by her husband***  who supplements the history. Previous records as well as any outside records available were reviewed prior to todays visit.   Patient was last seen on October 2024 with MMSE 29/30.***    Any changes in memory since last visit? ".  Husband says that this is subjective.  In conversation, she may need a little more time to remember names of people.  She is able to remember faces without difficulty.  She does not participate in activities except going to church, as she has high anxiety, aware of how others will perceive her. repeats oneself?  Endorsed Disoriented when walking into a room?  Patient denies ***  Misplacing objects?  Patient denies   Wandering behavior?   denies   Any personality changes since last visit?  As before, she becomes anxious and frustrated when she cannot remember. Any worsening depression?: denies   Hallucinations or paranoia?  denies   Seizures?   denies    Any sleep changes? Sleeps well***. Does not sleep very well***.   Denies vivid dreams, REM behavior  or sleepwalking   Sleep apnea?   Endorsed, she uses a CPAP, she is due for a sleep study.***  Any hygiene concerns?   denies   Independent of bathing and dressing?  Endorsed  Does the patient needs help with medications? Patient is in charge *** Who is in charge of the finances?  Patient is in charge   *** Any changes in appetite?  denies ***   Patient have trouble swallowing?  denies   Does the patient cook?  Any kitchen accidents such as leaving the stove on?   denies   Any headaches?    denies   Vision changes? denies Chronic pain?  denies   Ambulates with difficulty?  She uses a cane for stability.***  Recent falls or head injuries?  Home 1-25 she had a mechanical fall while climbing into bed and hitting her head.  She went to the ED, with comminuted fracture involving the right C2 lateral mass extending to the transverse and foramen and x 3 dens fracture millimeter anterior displacement of the dens.  MRI of the C-spine showed extensive ligamentous injury as well as epidural hemorrhage in the right lateral canal and C1 too small volume traumatic upper vertebral edema despite these findings, she remained neurologically intact, able to walk without any issues. Unilateral weakness, numbness or tingling?  She has chronic lower extremity subjective tingling with prior EMG negative. Any tremors?  denies   Any anosmia?    denies   Any incontinence of urine?  denies   Any bowel dysfunction?  denies  Patient lives with her husband of 63 years, does not think that she has memory issues.*** Does the patient drive?  "Not really" her husband does most of the driving.***     Neuropsychological evaluation 10/22 Briefly, results suggested significant impairment surrounding confrontation naming, as well as all aspects of verbal and visual memory. Mild performance variability was further exhibited across cognitive flexibility. The most likely culprit for ongoing cognitive decline is unfortunately  Alzheimer's disease. Across memory testing, [name] did not benefit from repeated exposure across learning trials and was essentially amnestic at a delay. Across yes/no recognition trials, she commented that she did not ever recall being presented a previous list of words, stories, or figure and stated that she was simply guessing. As such, below average scores are likely inflated by happenstance guessing. Overall, memory patterns suggest a severe memory storage deficit, which is the hallmark characteristic of this disease process. Severe impairment in confrontation naming is also very consistent with this disease process.     Initial office visit 2021 had been a difficult year emotionally for her.  She is caring for her husband with dementia.  Her son passed away.  She started having headaches in December 2021.  It followed cataract surgery, so she thought it was due to the eye drops but it persisted after stopping the drops.  They were severe daily headaches that moved around different locations on her head.  She has had some memory problems for a couple of years which has been noticeable to herself and family over the past year.  When talking about recent events, she will forget, such as where she had lunch or what she did that day.  She will repeat herself 5 to 10 times during the same conversation.  Her daughter started managing her medication because she would sometimes forget to take her medications despite using a pill box.  So far, she has been paying her bills without difficulty.  She drives but denies difficulty or disorientation.  One time, she thought that she was playing hide and seek with her granddaughter who was not there.  On 02/18/2020, she exhibited altered mental status.  She thought her mother who is long passed, was in the house cooking dinner. She went to the ED for further evaluation.  CT head personally reviewed unremarkable.  UA showed possible UTI and was prescribed Keflex.  CXR  negative and labs negative for leukocytosis or electrolyte imbalance.  She was also dehydrated.  Followed up with primary care.  MRI of brain without contrast on 03/02/2020 personally reviewed showed mild cerebral atrophy with chronic small vessel ischemic changes and sequelae of prior right pterional craniotomy with no visible tumor recurrence but no acute abnormality.  Headaches now are infrequent, mild and lasting a couple of hours about 2 days a week.  TSH from January was 2.26.       MRI brain 04/2020 No acute intracranial abnormality or significant interval change. 2. Stable mild atrophy and white matter disease. This likely reflects the sequela of chronic microvascular ischemia. 3. No residual or recurrent sellar mass. Right pterional craniotomy noted.     Extensive electrodiagnostic testing of the right lower extremity and additional studies of the left shows:  Right superficial peroneal and left sural sensory responses are absent.  Right sural and left superficial peroneal sensory responses are within normal limits. Bilateral peroneal motor responses show reduced amplitude at the extensor digitorum brevis, and is normal at the tibialis anterior.  Bilateral tibial motor responses  are within normal limits. Bilateral tibial H reflex studies are within normal limits. There is no evidence of active or chronic motor axonal loss changes affecting any of the tested muscles.  Motor unit configuration and recruitment pattern is within normal limits.  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 Echo 2012: EF 45-50%;  b. Lex MV 2/12:  low risk, apical defect (small area of ischemia vs shifting breast atten);  c.  Echo 7/12: Normal wall thickness, EF 60-65%, normal wall motion, grade 1 diastolic dysfunction, mild LAE, PASP 32;   d. Lex MV 11/13:  EF 76%, no ischemia   OA (osteoarthritis) 09/07/2011   Obesity 09/28/2014   Osteoporosis 03/07/2011   DEXA T score -2.6 AP spine 03/07/11    Formatting of this note might be different from the original. Formatting of this note might be different from the original. DEXA T score -2.6 AP spine 03/07/11   Last Assessment & Plan:  Formatting of this note might be different from the original. Encouraged to get adequate exercise, calcium and vitamin d intake   Pain in joint, ankle and foot 09/28/2014   Palpitation 10/28/2020   Personal history of chemotherapy 2012   Personal history of radiation therapy 2012   Prediabetes 06/15/2009   Recurrent falls 12/28/2015   Right knee pain 07/10/2020   Shortness of breath     UTI (urinary tract infection) 03/03/2012   Vertebral fracture, osteoporotic, sequela 04/05/2016   Vitamin D deficiency 06/14/2017   Supplement and monitor     Past Surgical History:  Procedure Laterality Date   BREAST LUMPECTOMY  04/2009   RIGHT FOR BREAST CANCER-CHEMO/RADIATION X 1 YEAR   BREAST REDUCTION SURGERY Bilateral 05/04/2014   Procedure: MAMMARY REDUCTION  (BREAST);  Surgeon: Louisa Second, MD;  Location: [facility];  Service: Plastics;  Laterality: Bilateral;   CARPAL TUNNEL RELEASE     L wrist, ulnar nerve moved   CHOLECYSTECTOMY     CRANIOTOMY FOR TUMOR  2000   ELBOW SURGERY  left   KNEE ARTHROSCOPY Left 10/12/2014   Procedure: LEFT KNEE ARTHROSCOPY ;  Surgeon: Ollen Gross, MD;  Location: [facility];  Service: Orthopedics;  Laterality: Left;   KYPHOPLASTY N/A 03/30/2016   Procedure: THORACIC 12 KYPHOPLASTY;  Surgeon: Estill Bamberg, MD;  Location: [facility];  Service: Orthopedics;  Laterality: N/A;  THORACIC 12 KYPHOPLASTY   LEFT HEART CATHETERIZATION WITH CORONARY ANGIOGRAM N/A 12/16/2013   Procedure: LEFT HEART CATHETERIZATION WITH CORONARY ANGIOGRAM;  Surgeon: Peter M [place], MD;  Location: [facility];  Service: Cardiovascular;  Laterality: N/A;   LIPOMA EXCISION  03/28/2009   right leg   MELANOMA EXCISION     PORT-A-CATH REMOVAL  11/30/2010   Streck   porta cath     PORTACATH PLACEMENT  may 2011   REDUCTION MAMMAPLASTY Bilateral    SYNOVECTOMY Left 10/12/2014   Procedure: WITH SYNOVECTOMY;  Surgeon: Ollen Gross, MD;  Location: [facility];  Service: Orthopedics;  Laterality: Left;   TONSILLECTOMY  1958   TOTAL HIP ARTHROPLASTY Left 11/21/2021   Procedure: TOTAL HIP ARTHROPLASTY ANTERIOR APPROACH;  Surgeon: Samson Frederic, MD;  Location: [facility];  Service: Orthopedics;  Laterality: Left;   TOTAL KNEE ARTHROPLASTY  02/05/2012   Procedure: TOTAL KNEE ARTHROPLASTY;  Surgeon: Loanne Drilling, MD;  Location: [facility];  Service: Orthopedics;  Laterality: Right;    TOTAL KNEE ARTHROPLASTY Left 02/10/2013   Procedure: LEFT TOTAL KNEE ARTHROPLASTY;  Surgeon: Loanne Drilling, MD;  Location: [facility];  Service: Orthopedics;  Laterality: Left;   total knee raplacement  01-2012   Right Knee   TOTAL KNEE REVISION Left 07/14/2020   Procedure: TOTAL KNEE REVISION;  Surgeon: Ollen Gross, MD;  Location: [facility];  Service: Orthopedics;  Laterality: Left;   TUBAL LIGATION  1997     PREVIOUS MEDICATIONS:   CURRENT MEDICATIONS:  Outpatient Encounter Medications as of 03/05/2023  Medication Sig   acetaminophen (TYLENOL) 325 MG tablet Take 2 tablets (650 mg total) by mouth every 6 (six) hours as needed for mild pain or moderate pain (pain score 1-3 or temp > 100.5).   albuterol (VENTOLIN HFA) 108 (90 Base) MCG/ACT inhaler INHALE 1-2 PUFFS INTO THE LUNGS EVERY 6 (SIX) HOURS AS NEEDED FOR WHEEZING OR SHORTNESS OF BREATH.   alendronate (FOSAMAX) 70 MG tablet TAKE 1 TABLET (70 MG TOTAL) BY MOUTH EVERY 7 (SEVEN) DAYS. TAKE WITH A FULL GLASS OF WATER ON AN EMPTY STOMACH.   Calcium Carbonate (CALCIUM 600 PO) Take 1 tablet by mouth daily.   carvedilol (COREG) 25 MG tablet TAKE 1 TABLET TWICE DAILY WITH MEALS   cetirizine (ZYRTEC) 10 MG tablet Take 10 mg by mouth at bedtime.   cholecalciferol (VITAMIN D3) 25 MCG (1000 UNIT) tablet Take 1,000 Units by mouth daily.   diltiazem (CARDIZEM CD) 120 MG 24 hr capsule TAKE 1 CAPSULE EVERY DAY   docusate sodium (COLACE) 100 MG capsule Take 1 capsule (100 mg total) by mouth 2 (two) times daily. (Patient taking differently: Take 100 mg by mouth 2 (two) times daily as needed.)   donepezil (ARICEPT) 10 MG tablet TAKE 1 TABLET AT BEDTIME   ELIQUIS 5 MG TABS tablet Take 5 mg by mouth 2 (two) times daily.   famotidine (PEPCID) 20 MG tablet TAKE 1 TABLET TWICE DAILY   ferrous sulfate 325 (65 FE) MG tablet Take 325 mg by mouth daily with breakfast.   folic acid (FOLVITE) 1 MG tablet Take 1 tablet (1 mg total) by mouth daily.   gabapentin  (  NEURONTIN) 300 MG capsule Take 1 capsule (300 mg total) by mouth 2 (two) times daily.   hydrocortisone 2.5 % ointment Apply topically.   hydrOXYzine (ATARAX) 25 MG tablet TAKE 0.5-1 TABLETS (12.5-25 MG TOTAL) BY MOUTH 2 (TWO) TIMES DAILY AS NEEDED. (Patient taking differently: Take 25 mg by mouth 2 (two) times daily as needed (allergies).)   lidocaine (LIDODERM) 5 % Place 1 patch onto the skin daily. Remove & Discard patch within 12 hours or as directed by MD   memantine (NAMENDA) 5 MG tablet Take 1 tablet (5 mg total) by mouth at bedtime.   Multiple Vitamin (MULTIVITAMIN WITH MINERALS) TABS tablet Take 1 tablet by mouth daily.   omeprazole (PRILOSEC) 20 MG capsule TAKE 1 CAPSULE BY MOUTH EVERY DAY   pentosan polysulfate (ELMIRON) 100 MG capsule Take 200 mg by mouth in the morning and at bedtime.   polyethylene glycol (MIRALAX / GLYCOLAX) 17 g packet Take 17 g by mouth daily as needed for mild constipation.   rosuvastatin (CRESTOR) 20 MG tablet TAKE 1 TABLET AT BEDTIME (Patient taking differently: Take 20 mg by mouth at bedtime.)   venlafaxine (EFFEXOR) 75 MG tablet Take 3 tablets (225 mg total) by mouth in the morning.   No facility-administered encounter medications on file as of 03/05/2023.     Objective:     PHYSICAL EXAMINATION:    VITALS:  There were no vitals filed for this visit.  GEN:  The patient appears stated age and is in NAD. HEENT:  Normocephalic, atraumatic.   Neurological examination:  General: NAD, well-groomed, appears stated age. Orientation: The patient is alert. Oriented to person, place and date Cranial nerves: There is good facial symmetry.The speech is fluent and clear. No aphasia or dysarthria. Fund of knowledge is appropriate. Recent memory impaired and remote memory is normal.  Attention and concentration are normal.  Able to name objects and repeat phrases.  Hearing is intact to conversational tone ***.   Delayed recall *** Sensation: Sensation is intact to  light touch throughout Motor: Strength is at least antigravity x4. DTR's 2/4 in UE/LE       No data to display             08/31/2022    4:00 PM 02/10/2022    8:00 PM 11/28/2021    1:38 PM  MMSE - Mini Mental State Exam  Orientation to time 5 4 5   Orientation to Place 5 5 5   Registration 3 3 3   Attention/ Calculation 5 5 5   Recall 3 3 3   Language- name 2 objects 2 2 2   Language- repeat 1 1 1   Language- follow 3 step command 3 3 3   Language- read & follow direction 1 1 1   Write a sentence 1 1 1   Copy design 0 1 1  Total score 29 29 30        Movement examination: Tone: There is normal tone in the UE/LE Abnormal movements:  no tremor.  No myoclonus.  No asterixis.   Coordination:  There is no decremation with RAM's. Normal finger to nose  Gait and Station: The patient has no difficulty arising out of a deep-seated chair without the use of the hands. The patient's stride length is good.  She uses a cane for stability.  Gait is cautious and narrow.   Thank you for allowing [place] the opportunity to participate in the care of this nice patient. Please do not hesitate to contact us for any questions or concerns.  Total time spent on today's visit was *** minutes dedicated to this patient today, preparing to see patient, examining the patient, ordering tests and/or medications and counseling the patient, documenting clinical information in the EHR or other health record, independently interpreting results and communicating results to the patient/family, discussing treatment and goals, answering patient's questions and coordinating care.  Cc:  Bradd Canary, MD  Marlowe Kays 03/05/2023 6:16 AM

## 2023-03-15 DIAGNOSIS — S12121A Other nondisplaced dens fracture, initial encounter for closed fracture: Secondary | ICD-10-CM | POA: Diagnosis not present

## 2023-03-24 ENCOUNTER — Other Ambulatory Visit: Payer: Self-pay | Admitting: Family Medicine

## 2023-03-24 DIAGNOSIS — G629 Polyneuropathy, unspecified: Secondary | ICD-10-CM

## 2023-03-29 ENCOUNTER — Other Ambulatory Visit: Payer: Self-pay | Admitting: Family Medicine

## 2023-04-02 ENCOUNTER — Institutional Professional Consult (permissible substitution): Payer: Medicare Other | Admitting: Psychology

## 2023-04-02 ENCOUNTER — Ambulatory Visit: Payer: Self-pay

## 2023-04-09 ENCOUNTER — Encounter: Payer: Medicare Other | Admitting: Psychology

## 2023-04-18 ENCOUNTER — Other Ambulatory Visit: Payer: Self-pay | Admitting: Family Medicine

## 2023-04-19 DIAGNOSIS — S12121A Other nondisplaced dens fracture, initial encounter for closed fracture: Secondary | ICD-10-CM | POA: Diagnosis not present

## 2023-04-22 NOTE — Assessment & Plan Note (Deleted)
 [id number] acceptable, minimize simple carbs. Increase exercise as tolerated. Continue current meds

## 2023-04-22 NOTE — Assessment & Plan Note (Deleted)
No recent exacerbation, no changes 

## 2023-04-22 NOTE — Assessment & Plan Note (Deleted)
 Continues to live at home with her husband and is stable

## 2023-04-22 NOTE — Assessment & Plan Note (Deleted)
 Well controlled, no changes to meds. Encouraged heart healthy diet such as the DASH diet and exercise as tolerated.

## 2023-04-22 NOTE — Assessment & Plan Note (Deleted)
 Supplement and monitor

## 2023-04-22 NOTE — Assessment & Plan Note (Deleted)
Continue to monitor, stable

## 2023-04-22 NOTE — Assessment & Plan Note (Deleted)
 Encourage heart healthy diet such as MIND or DASH diet, increase exercise, avoid trans fats, simple carbohydrates and processed foods, consider a krill or fish or flaxseed oil cap daily. Tolerating Rosuvastatin

## 2023-04-22 NOTE — Assessment & Plan Note (Deleted)
Tolerating Fosamax Encouraged to get adequate exercise, calcium and vitamin d intake °

## 2023-04-23 ENCOUNTER — Ambulatory Visit: Payer: Medicare Other | Admitting: Family Medicine

## 2023-04-23 DIAGNOSIS — F0390 Unspecified dementia without behavioral disturbance: Secondary | ICD-10-CM

## 2023-04-23 DIAGNOSIS — I420 Dilated cardiomyopathy: Secondary | ICD-10-CM

## 2023-04-23 DIAGNOSIS — I10 Essential (primary) hypertension: Secondary | ICD-10-CM

## 2023-04-23 DIAGNOSIS — R7303 Prediabetes: Secondary | ICD-10-CM

## 2023-04-23 DIAGNOSIS — I5032 Chronic diastolic (congestive) heart failure: Secondary | ICD-10-CM

## 2023-04-23 DIAGNOSIS — E559 Vitamin D deficiency, unspecified: Secondary | ICD-10-CM

## 2023-04-23 DIAGNOSIS — E782 Mixed hyperlipidemia: Secondary | ICD-10-CM

## 2023-04-23 DIAGNOSIS — M810 Age-related osteoporosis without current pathological fracture: Secondary | ICD-10-CM

## 2023-06-05 ENCOUNTER — Ambulatory Visit

## 2023-06-05 ENCOUNTER — Ambulatory Visit (INDEPENDENT_AMBULATORY_CARE_PROVIDER_SITE_OTHER): Admitting: Family Medicine

## 2023-06-05 ENCOUNTER — Encounter: Payer: Self-pay | Admitting: Family Medicine

## 2023-06-05 VITALS — BP 155/100 | HR 73 | Temp 98.5°F | Resp 16 | Ht 63.0 in | Wt 145.0 lb

## 2023-06-05 DIAGNOSIS — M542 Cervicalgia: Secondary | ICD-10-CM

## 2023-06-05 DIAGNOSIS — R2242 Localized swelling, mass and lump, left lower limb: Secondary | ICD-10-CM | POA: Diagnosis not present

## 2023-06-05 DIAGNOSIS — G44309 Post-traumatic headache, unspecified, not intractable: Secondary | ICD-10-CM

## 2023-06-05 DIAGNOSIS — I10 Essential (primary) hypertension: Secondary | ICD-10-CM

## 2023-06-05 DIAGNOSIS — S12101A Unspecified nondisplaced fracture of second cervical vertebra, initial encounter for closed fracture: Secondary | ICD-10-CM | POA: Diagnosis not present
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
## 2023-06-05 NOTE — Patient Instructions (Addendum)
 A few things to remember from today's visit:  Mass of left thigh - Plan: US  LT LOWER EXTREM LTD SOFT TISSUE NON VASCULAR  Cervical pain (neck) - Plan: DG Cervical Spine Complete  We are going to repeat neck X ray, we can consider PT for pain.Local massage and topical icy hot may help.  Ultrasound of left thigh will be arranged. ? Hematoma. Monitor for new symptoms.  Monitor blood pressure at home.  Do not use My Chart to request refills or for acute issues that need immediate attention. If you send a my chart message, it may take a few days to be addressed, specially if I am not in the office.  Please be sure medication list is accurate. If a new problem present, please set up appointment sooner than planned today.

## 2023-06-13 ENCOUNTER — Ambulatory Visit (HOSPITAL_BASED_OUTPATIENT_CLINIC_OR_DEPARTMENT_OTHER)
Admission: RE | Admit: 2023-06-13 | Discharge: 2023-06-13 | Disposition: A | Discharge status: Home / Self Care | Source: Ambulatory Visit | Attending: Family Medicine | Admitting: Family Medicine

## 2023-06-13 DIAGNOSIS — R2242 Localized swelling, mass and lump, left lower limb: Secondary | ICD-10-CM | POA: Insufficient documentation

## 2023-06-13 DIAGNOSIS — G44309 Post-traumatic headache, unspecified, not intractable: Secondary | ICD-10-CM | POA: Insufficient documentation

## 2023-06-13 DIAGNOSIS — I6782 Cerebral ischemia: Secondary | ICD-10-CM | POA: Diagnosis not present

## 2023-06-14 NOTE — Telephone Encounter (Unsigned)
 Copied from CRM [id number]. Topic: Clinical - Lab/Test Results >> Jun 14, 2023  1:33 PM Alysia Jumbo S wrote: Reason for CRM: Patient called to review U/S and CT results. Request callback

## 2023-06-16 ENCOUNTER — Ambulatory Visit: Payer: Self-pay | Admitting: Family Medicine

## 2023-06-18 ENCOUNTER — Ambulatory Visit: Payer: Self-pay

## 2023-06-18 ENCOUNTER — Ambulatory Visit (INDEPENDENT_AMBULATORY_CARE_PROVIDER_SITE_OTHER): Payer: Medicare Other | Admitting: Psychology

## 2023-06-18 ENCOUNTER — Encounter: Payer: Self-pay | Admitting: Psychology

## 2023-06-18 DIAGNOSIS — S12101A Unspecified nondisplaced fracture of second cervical vertebra, initial encounter for closed fracture: Secondary | ICD-10-CM | POA: Insufficient documentation

## 2023-06-18 DIAGNOSIS — G309 Alzheimer's disease, unspecified: Secondary | ICD-10-CM | POA: Diagnosis not present

## 2023-06-18 DIAGNOSIS — M4310 Spondylolisthesis, site unspecified: Secondary | ICD-10-CM | POA: Insufficient documentation

## 2023-06-18 DIAGNOSIS — F0280 Dementia in other diseases classified elsewhere without behavioral disturbance: Secondary | ICD-10-CM

## 2023-06-18 HISTORY — DX: Dementia in other diseases classified elsewhere, unspecified severity, without behavioral disturbance, psychotic disturbance, mood disturbance, and anxiety: F02.80

## 2023-06-18 NOTE — Progress Notes (Signed)
 NEUROPSYCHOLOGICAL EVALUATION [facility] Department of Neurology  Date of Evaluation: Jun 18, 2023  Reason for Referral:   Shannon [name] is a 78 y.o. right-handed Caucasian female referred by Tex Filbert, PA-C, to characterize her current cognitive functioning and assist with diagnostic clarity and treatment planning in the context of prior concerns for underlying Alzheimer's disease and progressive cognitive decline over time.   Assessment and Plan:   Clinical Impression(s): Shannon [name]' pattern of performance is suggestive of severe impairment surrounding all aspects of learning and memory. Additional severe impairments were exhibited across cognitive flexibility, semantic fluency, and confrontation naming. Processing speed represented a relative weakness while variability was exhibited across visuospatial abilities. Performances were appropriate relative to age-matched peers across basic attention, abstract reasoning, receptive language, and phonemic fluency. Functionally, medical records suggest that family has taken over medication management out of necessity as Shannon [name] was commonly forgetting to take medications appropriately. 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 If the rest of her day-to-day functioning is intact as she describes, she would remain towards the mild end of this spectrum presently.   Relative to her previous evaluation in November 2022, clinically significant decline was exhibited across several assessed domains, including processing speed, cognitive flexibility, verbal fluency (semantic worse than phonemic), visuoconstructional abilities, and all aspects of learning and memory. This decline was despite an improvement in acute  psychiatric distress across mood-related questionnaires.   Regarding the cause for her mild dementia presentation, I continue to have primary concerns surrounding underlying Alzheimer's disease. Across memory tasks, Shannon [name] was fully amnestic (i.e., 0% retention) across all three memory tasks after a brief delay and performed poorly across a list-based recognition trial. Taken together, this suggests evidence for rapid forgetting and a prominent storage impairment, both of which are the hallmark testing patterns for this illness. Severe impairment surrounding semantic fluency and confrontation naming follows typical and expected progression. This, coupled with evidence for objective decline over time, further elevates the likelihood of this disease process. Continued medical monitoring will be important moving forward.   Recommendations: Shannon [name] has already been prescribed medication aimed to address memory loss and concerns surrounding Alzheimer's disease (i.e., donepezil /Aricept  and memantine /Namenda ). She is encouraged to continue taking this medication as prescribed. It is important to highlight that this medication has been shown to slow functional decline in some individuals. There is no current treatment which can stop or reverse cognitive decline when caused by a neurodegenerative illness.   Performance across neurocognitive testing is not a strong predictor of an individual's safety operating a motor vehicle. With that being said, cognitive decline surrounding cognitive flexibility, visuospatial abilities, and memory do raise concern regarding her safety behind the wheel. I would recommend that she complete a formal driving evaluation based on current findings. Should she or her family wish to pursue a formalized driving evaluation, they could reach out to the following agencies: [employer] in [place]: [phone] [facility]: [phone] [facility]: [phone] [facility]: [phone] or [phone]  Should there be progression of current deficits over time, Shannon [name] is unlikely to regain any independent living skills lost. Therefore, it is recommended that she remain as involved as possible in all aspects of household chores, finances, and medication management, with supervision to ensure adequate performance. She will likely benefit  from the establishment and maintenance of a routine in order to maximize her functional abilities over time.  It will be important for Shannon [name] to have another person with her when in situations where she may need to process information, weigh the pros and cons of different options, and make decisions, in order to ensure that she fully understands and recalls all information to be considered.  If not already done, Shannon [name]. Ziff and her family may want to discuss her wishes regarding durable power of attorney and medical decision making, so that she can have input into these choices. If they require legal assistance with this, long-term care resource access, or other aspects of estate planning, they could reach out to [employer] at [phone] for a free consultation. Additionally, they may wish to discuss future plans for caretaking and seek out community options for in home/residential care should they become necessary.  Shannon [name] is encouraged to attend to lifestyle factors for brain health (e.g., regular physical exercise, good nutrition habits and consideration of the MIND-DASH diet, regular participation in cognitively-stimulating activities, and general stress management techniques), which are likely to have benefits for both emotional adjustment and cognition. Optimal control of vascular risk factors (including safe cardiovascular exercise and adherence to dietary recommendations) is encouraged. Continued participation in activities which provide mental stimulation and social interaction is  also recommended.   Important information should be provided to Shannon [name] in written format in all instances. This information should be placed in a highly frequented and easily visible location within her home to promote recall. External strategies such as written notes in a consistently used memory journal, visual and nonverbal auditory cues such as a calendar on the refrigerator or appointments with alarm, such as on a cell phone, can also help maximize recall.  To address problems with processing speed, she may wish to consider:   -Ensuring that she is alerted when essential material or instructions are being presented   -Adjusting the speed at which new information is presented   -Allowing for more time in comprehending, processing, and responding in conversation   -Repeating and paraphrasing instructions or conversations aloud  To address problems with fluctuating attention and/or executive dysfunction, she may wish to consider:   -Avoiding external distractions when needing to concentrate   -Limiting exposure to fast paced environments with multiple sensory demands   -Writing down complicated information and using checklists   -Attempting and completing one task at a time (i.e., no multi-tasking)   -Verbalizing aloud each step of a task to maintain focus   -Taking frequent breaks during the completion of steps/tasks to avoid fatigue   -Reducing the amount of information considered at one time   -Scheduling more difficult activities for a time of day where she is usually most alert  Review of Records:   Shannon [name] was seen by [facility] Janne Members, D.O.) on 12/25/2017 due to an episode of confusion with headache symptoms. About three weeks prior to that appointment, she developed body aches with diffuse weakness and daytime drowsiness. She felt unsteady on her feet. While driving, she noted she was veering off the road. B1, vitamin D , TSH, and B12 were unremarkable. She presented  to the ED on 12/14/2017 for further evaluation. CBC, CMP, ammonia, and UA were unremarkable. Short-term memory dysfunction was first reported at this time. She reported difficulty keeping track of names and her medications.   Shannon [name] was seen by Dr. Festus Hubert on 04/12/2020 for altered mental status.  2021 reportedly was a difficult year for her. She has been caring for her husband who has dementia and her adult son passed away. Headaches emerged in December 2021. These followed cataract surgery and it was originally thought that symptoms were caused by eye drops. However, these have seemingly persisted since stopping these drops. Symptoms were said to be severe and move to different locations. She also reported memory concerns which have been ongoing for the past several years, noticeable by both herself and her family. She may have trouble recalling recent events and repeat herself frequently during conversations. There was also an odd experience where Shannon [name] was reportedly found playing hide and seek with her granddaughter who was not actually there. Her daughter started managing medications as Shannon [name] would reportedly forget to take her medications despite using a pill box. Trouble with driving or financial management was denied.    On 02/18/2020, she exhibited altered mental status. She thought her mother who is long passed was in the house cooking dinner. She went to the ED for further evaluation. Head CT was unremarkable. UA showed possible UTI and she was prescribed Keflex . CXR was negative and labs were negative for leukocytosis or electrolyte imbalance. She was dehydrated. Headaches were said to have become infrequent and mild, lasting a couple of hours about two days a week. Performance on a brief cognitive screening instrument (SLUMS) was 16/30. Ultimately, Shannon [name] was referred for a comprehensive neuropsychological evaluation to characterize her cognitive abilities and to assist with diagnostic  clarity and treatment planning.   She completed a comprehensive neuropsychological evaluation with myself on 12/01/2020. Results suggested significant impairment surrounding confrontation naming, as well as all aspects of verbal and visual memory. Mild performance variability was further exhibited across cognitive flexibility. She denied functional impairment and therefore was diagnosed with a mild neurocognitive disorder. Concerns for underlying Alzheimer's disease were expressed given memory performances. Repeat testing was recommended.    Ultimately, Shannon [name] was referred for a comprehensive neuropsychological evaluation to characterize her cognitive abilities and to assist with diagnostic clarity and treatment planning.   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 Head CT on 06/24/1998 revealed postoperative changes surrounding her right frontotemporal craniotomy. More recently, brain MRI on 12/29/2017 revealed mild chronic small vessel ischemia. Head CT on 03/28/2019 was negative. Head CT on 02/18/2020 was unremarkable outside of mild generalized volume loss and mild small vessel ischemic changes. Brain MRI on 03/02/2020 also revealed mild cerebral atrophy and small vessel ischemic changes. Brain MRI on 05/01/2020 was stable. Head CT on 02/01/2023 in the context of a fall was negative.  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 LPN, Bartholomew Boros of this note might be different from the original.  Formatting of this note might be different from the original.  Qualifier: Diagnosis of   By: [employer] LPN, Haskell Linker      Last Assessment & Plan:   Formatting of this note might be different from the original.  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 Echo 2012: EF 45-50%;  b. Lex MV 2/12:  low risk, apical defect (small area of ischemia vs shifting breast atten);  c.  Echo 7/12: Normal wall thickness, EF 60-65%, normal wall motion, grade 1 diastolic dysfunction, mild LAE, PASP 32;   d. Lex MV 11/13:  EF 76%, no ischemia   OA (osteoarthritis) 09/07/2011   Obesity 09/28/2014   Obstructive sleep apnea 01/12/2021   Osteoporosis 03/07/2011   DEXA T score -2.6 AP spine 03/07/11     Formatting of this note might be different from the original. Formatting of this note might be different from the original. DEXA T score -2.6 AP spine 03/07/11   Last Assessment & Plan:  Formatting of this note might be different from the original. Encouraged to get adequate exercise, calcium  and vitamin d  intake   Pain in joint, ankle and foot 09/28/2014   Pain of right hip joint 02/09/2021   Palpitation 10/28/2020   Personal history of chemotherapy 2012   Personal history of radiation therapy 2012   Postoperative anemia due to acute blood loss 02/13/2013   Prediabetes 06/15/2009   Pulmonary embolism 11/29/2021   Occurred after hip replacement in 2023     Recurrent falls 12/28/2015   Right knee pain 07/10/2020   Shortness of breath    Sternal pain 11/06/2013   Syncope 02/27/2016   Urinary frequency 02/11/2020   UTI (urinary tract infection) 03/03/2012   Vertebral fracture, osteoporotic, sequela 04/05/2016   Vitamin D  deficiency 06/14/2017   Supplement and monitor    Past Surgical History:  Procedure Laterality Date   BREAST LUMPECTOMY  04/2009   RIGHT FOR BREAST CANCER-CHEMO/RADIATION X 1 YEAR   BREAST REDUCTION SURGERY Bilateral 05/04/2014   Procedure: MAMMARY REDUCTION  (BREAST);  Surgeon: Phyllis Breeze, MD;  Location: [facility];  Service: Plastics;  Laterality: Bilateral;   CARPAL TUNNEL RELEASE     L wrist, ulnar nerve moved   CHOLECYSTECTOMY     CRANIOTOMY FOR TUMOR  2000   ELBOW SURGERY     left   KNEE ARTHROSCOPY Left 10/12/2014   Procedure: LEFT KNEE ARTHROSCOPY ;  Surgeon: Liliane Rei, MD;  Location: [facility];  Service: Orthopedics;  Laterality: Left;   KYPHOPLASTY N/A 03/30/2016   Procedure: THORACIC 12 KYPHOPLASTY;  Surgeon: Virl Grimes, MD;  Location: [facility];  Service: Orthopedics;  Laterality: N/A;  THORACIC 12 KYPHOPLASTY   LEFT HEART CATHETERIZATION WITH CORONARY ANGIOGRAM N/A 12/16/2013   Procedure: LEFT HEART CATHETERIZATION WITH CORONARY  ANGIOGRAM;  Surgeon: Peter M Swaziland, MD;  Location[facility] C[facility];  Service: Cardiovascular;  Laterality: N/A;   LIPOMA EXCISION  03/28/2009   right leg   MELANOMA EXCISION     PORT-A-CATH REMOVAL  11/30/2010   Streck   porta cath     PORTACATH PLACEMENT  may 2011   REDUCTION MAMMAPLASTY Bilateral    SYNOVECTOMY Left 10/12/2014   Procedure: WITH SYNOVECTOMY;  Surgeon: Liliane Rei, MD;  Location: [facility];  Service: Orthopedics;  Laterality: Left;   TONSILLECTOMY  1958   TOTAL HIP ARTHROPLASTY Left 11/21/2021   Procedure: TOTAL HIP ARTHROPLASTY ANTERIOR APPROACH;  Surgeon: Adonica Hoose, MD;  Location: [facility];  Service: Orthopedics;  Laterality: Left;   TOTAL KNEE ARTHROPLASTY  02/05/2012   Procedure: TOTAL KNEE ARTHROPLASTY;  Surgeon: Aurther Blue, MD;  Location: [facility];  Service: Orthopedics;  Laterality: Right;   TOTAL KNEE ARTHROPLASTY Left 02/10/2013   Procedure: LEFT TOTAL KNEE ARTHROPLASTY;  Surgeon: Aurther Blue, MD;  Location: [facility];  Service: Orthopedics;  Laterality: Left;   total knee raplacement  01-2012   Right Knee   TOTAL KNEE REVISION Left 07/14/2020   Procedure: TOTAL KNEE REVISION;  Surgeon: Liliane Rei, MD;  Location: [facility];  Service: Orthopedics;  Laterality: Left;   TUBAL LIGATION  1997    Current Outpatient Medications:    acetaminophen  (TYLENOL ) 325 MG tablet, Take 2 tablets (650 mg total) by mouth every 6 (six) hours as needed for mild pain or moderate pain (pain score 1-3 or temp > 100.5)., Disp: , Rfl:    albuterol  (VENTOLIN  HFA) 108 (90 Base) MCG/ACT inhaler, INHALE 1-2 PUFFS INTO THE LUNGS EVERY 6 (SIX) HOURS AS NEEDED FOR WHEEZING OR SHORTNESS OF BREATH., Disp: 18 each, Rfl: 3   alendronate  (FOSAMAX ) 70 MG tablet, TAKE 1 TABLET EVERY 7 DAYS. TAKE WITH A FULL GLASS OF WATER  ON AN EMPTY STOMACH., Disp: 12 tablet, Rfl: 3   Calcium  Carbonate (CALCIUM  600 PO), Take 1 tablet by mouth daily., Disp: , Rfl:    carvedilol  (COREG ) 25 MG tablet, TAKE 1 TABLET TWICE  DAILY WITH MEALS, Disp: 120 tablet, Rfl: 5   cetirizine (ZYRTEC) 10 MG tablet, Take 10 mg by mouth at bedtime., Disp: , Rfl:    cholecalciferol  (VITAMIN D3) 25 MCG (1000 UNIT) tablet, Take 1,000 Units by mouth daily., Disp: , Rfl:    diltiazem  (CARDIZEM  CD) 120 MG 24 hr capsule, TAKE 1 CAPSULE EVERY DAY, Disp: 90 capsule, Rfl: 3   docusate sodium  (COLACE) 100 MG capsule, Take 1 capsule (100 mg total) by mouth 2 (two) times daily. (Patient taking differently: Take 100 mg by mouth 2 (two) times daily as needed.), Disp: 10 capsule, Rfl: 0   donepezil  (ARICEPT ) 10 MG tablet, TAKE 1 TABLET AT BEDTIME, Disp: 90 tablet, Rfl: 3   ELIQUIS  5 MG TABS tablet, Take 5 mg by mouth 2 (two) times daily., Disp: , Rfl:    famotidine  (PEPCID ) 20 MG tablet, TAKE 1 TABLET TWICE DAILY, Disp: 180 tablet, Rfl: 3   ferrous sulfate  325 (65 FE) MG tablet, Take 325 mg by mouth daily with  breakfast., Disp: , Rfl:    folic acid  (FOLVITE ) 1 MG tablet, Take 1 tablet (1 mg total) by mouth daily., Disp: 90 tablet, Rfl: 0   gabapentin  (NEURONTIN ) 300 MG capsule, TAKE 1 CAPSULE TWICE DAILY, Disp: 180 capsule, Rfl: 0   hydrocortisone 2.5 % ointment, Apply topically., Disp: , Rfl:    hydrOXYzine  (ATARAX ) 25 MG tablet, TAKE 0.5-1 TABLETS (12.5-25 MG TOTAL) BY MOUTH 2 (TWO) TIMES DAILY AS NEEDED. (Patient taking differently: Take 25 mg by mouth 2 (two) times daily as needed (allergies).), Disp: 180 tablet, Rfl: 0   lidocaine  (LIDODERM ) 5 %, Place 1 patch onto the skin daily. Remove & Discard patch within 12 hours or as directed by MD, Disp: 30 patch, Rfl: 0   memantine  (NAMENDA ) 5 MG tablet, Take 1 tablet (5 mg total) by mouth at bedtime., Disp: 30 tablet, Rfl: 11   Multiple Vitamin (MULTIVITAMIN WITH MINERALS) TABS tablet, Take 1 tablet by mouth daily., Disp: , Rfl:    omeprazole  (PRILOSEC) 20 MG capsule, TAKE 1 CAPSULE BY MOUTH EVERY DAY, Disp: 90 capsule, Rfl: 1   pentosan polysulfate (ELMIRON ) 100 MG capsule, Take 200 mg by mouth in  the morning and at bedtime., Disp: , Rfl:    polyethylene glycol (MIRALAX  / GLYCOLAX ) 17 g packet, Take 17 g by mouth daily as needed for mild constipation., Disp: 14 each, Rfl: 0   rosuvastatin  (CRESTOR ) 20 MG tablet, TAKE 1 TABLET AT BEDTIME (Patient taking differently: Take 20 mg by mouth at bedtime.), Disp: 90 tablet, Rfl: 3   venlafaxine  (EFFEXOR ) 75 MG tablet, Take 3 tablets (225 mg total) by mouth in the morning., Disp: 270 tablet, Rfl: 0     08/31/2022    4:00 PM 02/10/2022    8:00 PM 11/28/2021    1:38 PM  MMSE - Mini Mental State Exam  Orientation to time 5 4 5   Orientation to Place 5 5 5   Registration 3 3 3   Attention/ Calculation 5 5 5   Recall 3 3 3   Language- name 2 objects 2 2 2   Language- repeat 1 1 1   Language- follow 3 step command 3 3 3   Language- read & follow direction 1 1 1   Write a sentence 1 1 1   Copy design 0 1 1  Total score 29 29 30    Clinical Interview:   The following information was obtained during a clinical interview with Shannon [name] prior to cognitive testing.  Cognitive Symptoms: Decreased short-term memory: Endorsed. Shannon [name] previously described trouble retrieving information from her memory on demand, especially names. Per medical records, memory concerns have been present for several years, at least dating back to 2019. These records also note that she frequently repeats herself in conversation and has trouble recalling her medications. Currently, she acknowledged ongoing generalized forgetfulness, stating that memory has likely worsened since her previous November 2022 evaluation.  Decreased long-term memory: Denied. Decreased attention/concentration: Denied. Reduced processing speed: Denied. Difficulties with executive functions: Endorsed. She previously acknowledged mild difficulties with organization and multi-tasking. She generally denied trouble with indecision or impulsivity. This was stable. No significant personality changes were reported.   Difficulties with emotion regulation: Denied. Difficulties with receptive language: Denied. Difficulties with word finding: Endorsed "every once in a while." Decreased visuoperceptual ability: Denied.   Difficulties completing ADLs: Somewhat. She previously acknowledged that her daughter provides some assistance with medication management as she is a Engineer, civil (consulting). Records suggest that her daughter fully took over medication management as Shannon [name]. Gallu[place] was commonly forgetting  to take her medications. This was first observed in her medical records in March 2022. She continued to deny trouble with financial responsibilities, bill paying, or driving. No informant was available to confirm or refute this reporting.   Additional Medical History: History of traumatic brain injury/concussion: Denied. Medical records suggest frequent falls, including one as recent as this past January. She denied any head injury concerns or diagnosed concerns to her knowledge.  History of stroke: Denied. History of seizure activity: Denied. History of known exposure to toxins: Denied. Symptoms of chronic pain: Denied. Experience of frequent headaches/migraines: Denied. She previously acknowledged a history of headache symptoms in the past, but generally denied their presence currently. Previous records suggest a history of severe headaches which had started diminishing in severity and frequency in March 2022. Currently, she noted some headache experiences, but that these occurred only several times per month.  Frequent instances of dizziness/vertigo: Denied.   Sensory changes: Denied.  Balance/coordination difficulties: Denied. She acknowledged mild balance instability from time to time. One side of the body was not said to be worse relative to the other. She denied any recent falls. Medical records do suggest a history of frequent falling in the past. The cause for this was unknown.  Other motor difficulties: Denied.   Other  medical conditions: Shannon [name]. Lauro was diagnosed with a craniopharyngioma and underwent a right frontotemporal craniotomy in May 2000. She was also diagnosed with right-sided breast cancer in 2011 and underwent lumpectomy, chemotherapy, and radiation treatment. She did not report persisting cognitive dysfunction following the completion of this treatment.   Sleep History: Estimated hours obtained each night: 8 hours.  Difficulties falling asleep: Denied. Difficulties staying asleep: Denied. Feels rested and refreshed upon awakening: Endorsed.   History of snoring: Denied. History of waking up gasping for air: Denied. Witnessed breath cessation while asleep: Denied. Medical records suggest a prior obstructive sleep apnea diagnosis. Shannon [name]. Smucker did not acknowledge this condition or report active treatment during interview.   History of vivid dreaming: Endorsed. Excessive movement while asleep: Denied. Instances of acting out her dreams: Denied.  Psychiatric/Behavioral Health History: Depression: Currently, she described herself as "a little bit down." Previously, she had described some mild depressive symptoms attributed to the passing of her son around 2020 and "not knowing what to do" with her life at that time. She denied a prior history of mental health concerns or any formal diagnoses to her knowledge. Medical records do suggest prior concerns surrounding depression prior to the passing of her son. Current or remote suicidal ideation, intent, or plan was denied.  Anxiety: Denied. Mania: Denied. Trauma History: Denied. Visual/auditory hallucinations: Denied. However, medical records do mention two possible instances where she was actively hallucinating. There is mention of a prior instance where she was found playing hide and seek with her granddaughter who was not actually present. There was also report of her believing that her mother who had long passed was in the kitchen cooking food. The latter  may have been present around the time of a possible UTI.  Delusional thoughts: Denied.   Tobacco: Denied. Alcohol : She denied current alcohol  consumption as well as a history of problematic alcohol  abuse or dependence.  Recreational drugs: Denied.  Family History: Problem Relation Age of Onset   Breast cancer Mother        sarcoma   Lung cancer Mother    Hypertension Mother    Prostate cancer Father    Congestive Heart Failure Father    Heart  attack Father    Prostate cancer Brother    Heart disease Maternal Grandfather        MI   Down syndrome Son    CVA Son    Stomach cancer Maternal Aunt    Dementia Maternal Aunt    Uterine cancer Maternal Aunt    Colon cancer Neg Hx    This information was confirmed by Shannon [name]. Maybell Spates.  Academic/Vocational History: Highest level of educational attainment: 12 years. 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 No relative weaknesses were identified.  History of developmental delay: Denied. History of grade repetition: Denied. Enrollment in special education courses: Denied. History of LD/ADHD: Denied.   Employment: Retired. 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   Evaluation Results:   Behavioral Observations: Shannon [name]. Bischoff was unaccompanied, arrived to her appointment on time, and was appropriately dressed and groomed. She appeared alert. Observed gait and station were within normal limits. Gross motor functioning appeared intact upon informal observation and no abnormal movements (e.g., tremors) were noted. Her affect was generally relaxed and positive. Spontaneous speech was fluent and word finding difficulties were not observed during interview. Thought processes were coherent, organized, and normal in content. Insight into her cognitive difficulties appeared limited. Expressive language and  memory dysfunction was quite prominent and likely greater than what she is able to appreciate.   During testing, Shannon [name]. Neubert often forget task instructions in the midst of the task. She commonly made exasperate gasps Dorsie Gaunt!") whenever a new task or individual item was introduced. She also commonly apologized for subjectively poor performances. Sustained attention was appropriate. Task engagement was adequate and she persisted when challenged. She did fatigue as the evaluation progressed. It was mildly abbreviated in response. Overall, Shannon [name]. Legault was cooperative with the clinical interview and subsequent testing procedures.   Adequacy of Effort: The validity of neuropsychological testing is limited by the extent to which the individual being tested may be assumed to have exerted adequate effort during testing. Shannon [name]. Vasey expressed her intention to perform to the best of her abilities and exhibited adequate task engagement and persistence. Performance across an embedded performance validity measure was below expectation. However, this is believed to be due to true and significant memory impairment rather than poor engagement or attempts to perform poorly. As such, the results of the current evaluation are believed to be a valid representation of Shannon [name]. Hougland' current cognitive functioning.  Test Results: Shannon [name]. Gewirtz was largely oriented at the time of the current evaluation. She was unable to estimate the current date.  Intellectual abilities based upon educational and vocational attainment were estimated to be in the average range. Premorbid abilities were estimated to be within the average range based upon a single-word reading test.   Processing speed was well below average to below average. Basic attention was below average. Cognitive flexibility was exceptionally low. Abstract reasoning was below average.  While not directly assessed, receptive language abilities were believed to be appropriate. Shannon [name]. Hinde  did not exhibit prominent difficulties comprehending task instructions and answered all questions asked of her appropriately. Assessed expressive language was variable. Phonemic fluency was below average, semantic fluency was exceptionally low to well below average, and confrontation naming was exceptionally low.     Assessed visuospatial/visuoconstructional abilities were variable, ranging from the exceptionally low to average normative ranges. Across her drawing of a  clock, numerical spacing abnormalities were exhibited. She also exhibited confusion and was unable to draw two distinct hands in correct locations. Across her copy of a complex figure, she exhibited a few minor visual distortions and one internal aspect was omitted entirely.   Learning (i.e., encoding) of novel verbal information was exceptionally low. Spontaneous delayed recall (i.e., retrieval) of previously learned information was also exceptionally low. Retention rates were 0% across a list learning task, 0% across a story learning task, and 0% across a figure drawing task. Performance across a list-based recognition task was exceptionally low, suggesting negligible evidence for information consolidation.   Results of emotional screening instruments suggested that recent symptoms of generalized anxiety were in the minimal to mild range, while symptoms of depression were within normal limits.  Table of Scores:   Note: This summary of test scores accompanies the interpretive report and should not be considered in isolation without reference to the appropriate sections in the text. Descriptors are based on appropriate normative data and may be adjusted based on clinical judgment. Terms such as "Within Normal Limits" and "Outside Normal Limits" are used when a more specific description of the test score cannot be determined. Descriptors refer to the current evaluation only.        Percentile - Normative Descriptor > 98 - Exceptionally  High 91-97 - Well Above Average 75-90 - Above Average 25-74 - Average 9-24 - Below Average 2-8 - Well Below Average < 2 - Exceptionally Low        Orientation: November 2022 Current     Raw Score Raw Score Percentile   NAB Orientation, Form 1 28/29 27/29 --- ---        Cognitive Screening:      RBANS, Form A: Standard Score/ Scaled Score Standard Score/ Scaled Score Percentile   Total Score 69 52 <1 Exceptionally Low  Immediate Memory 65 53 <1 Exceptionally Low    List Learning 4 2 <1 Exceptionally Low    Story Memory 4 3 1  Exceptionally Low  Visuospatial/Constructional 105 84 14 Below Average    Figure Copy 10 5 5  Well Below Average    Line Orientation 18/20 17/20 51-75 Average  Language 68 47 <1 Exceptionally Low    Picture Naming 6/10 6/10 <2 Exceptionally Low    Semantic Fluency 7 1 <1 Exceptionally Low  Attention 94 72 3 Well Below Average    Digit Span 11 7 16  Below Average    Coding 7 4 2  Well Below Average  Delayed Memory 48 40 <1 Exceptionally Low    List Recall 1/10 0/10 <2 Exceptionally Low    List Recognition 15/20 12/20 <2 Exceptionally Low    Story Recall 1 1 <1 Exceptionally Low    Figure Recall 1 1 <1 Exceptionally Low        Intellectual Functioning:       Standard Score Standard Score Percentile   Test of Premorbid Functioning: 99 91 27 Average        Attention/Executive Function:      Trail Making Test (TMT): Raw Score (T Score) Raw Score (T Score) Percentile     Part A 31 secs.,  0 errors (56) 60 secs.,  0 errors (38) 12 Below Average    Part B 208 secs.,  2 errors (39) Discontinued --- Impaired          Scaled Score Scaled Score Percentile   WAIS-IV Similarities: 8 6 9  Below Average        D-KEFS  Verbal Fluency Test: Raw Score (Scaled Score) Raw Score (Scaled Score) Percentile     Letter Total Correct 44 (13) 26 (7) 16 Below Average    Category Total Correct 25 (7) 18 (4) 2 Well Below Average    Category Switching Total Correct 5 (2) 5 (2)  <1 Exceptionally Low    Category Switching Accuracy 2 (1) 4 (3) 1 Exceptionally Low      Total Set Loss Errors 8 (3) 8 (3) 1 Exceptionally Low      Total Repetition Errors 14 (1) 16 (1) <1 Exceptionally Low        Language:      Verbal Fluency Test: Raw Score (T Score) Raw Score (T Score) Percentile     Phonemic Fluency (FAS) 44 (55) 26 (40) 16 Below Average    Animal Fluency 15 (47) 6 (21) <1 Exceptionally Low         NAB Language Module, Form 1: T Score T Score Percentile     Naming 17/31 (19) 16/31 (19) <1 Exceptionally Low        Visuospatial/Visuoconstruction:       Raw Score Raw Score Percentile   Clock Drawing: 7/10 6/10 --- Impaired        Mood and Personality:       Raw Score Raw Score Percentile   Geriatric Depression Scale: 16 7 --- Within Normal Limits  Geriatric Anxiety Scale: 22 12 --- Mild    Somatic 8 5 --- Minimal    Cognitive 8 3 --- Mild    Affective 6 4 --- Mild   Informed Consent and Coding/Compliance:   The current evaluation represents a clinical evaluation for the purposes previously outlined by the referral source and is in no way reflective of a forensic evaluation.   Shannon [name]. Roehrich was provided with a verbal description of the nature and purpose of the present neuropsychological evaluation. Also reviewed were the foreseeable risks and/or discomforts and benefits of the procedure, limits of confidentiality, and mandatory reporting requirements of this provider. The patient was given the opportunity to ask questions and receive answers about the evaluation. Oral consent to participate was provided by the patient.   This evaluation was conducted by Melville Stade, Ph.D., ABPP-CN, board certified clinical neuropsychologist. Shannon [name]. Lapenna completed a clinical interview with Dr. Kitty Perkins, billed as one unit [phone], and 90 minutes of cognitive testing and scoring, billed as one unit [phone] and two additional units [id number]. As a separate and discrete service, one unit [phone] and  two units 96133 (160 minutes) were billed for Dr. Arsenio Larger time spent in interpretation and report writing.

## 2023-06-19 ENCOUNTER — Other Ambulatory Visit: Payer: Self-pay | Admitting: Physician Assistant

## 2023-06-19 ENCOUNTER — Encounter: Payer: Self-pay | Admitting: Family Medicine

## 2023-06-19 ENCOUNTER — Ambulatory Visit (INDEPENDENT_AMBULATORY_CARE_PROVIDER_SITE_OTHER): Admitting: Family Medicine

## 2023-06-19 VITALS — BP 128/80 | HR 85 | Resp 16 | Ht 63.0 in | Wt 147.4 lb

## 2023-06-19 DIAGNOSIS — R2242 Localized swelling, mass and lump, left lower limb: Secondary | ICD-10-CM | POA: Diagnosis not present

## 2023-06-19 DIAGNOSIS — S12101S Unspecified nondisplaced fracture of second cervical vertebra, sequela: Secondary | ICD-10-CM | POA: Diagnosis not present
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
## 2023-06-19 NOTE — Patient Instructions (Addendum)
 A few things to remember from today's visit:  Closed nondisplaced fracture of second cervical vertebra, unspecified fracture morphology, sequela  Mass of left thigh No changes today. Monitor for new symptoms. Call Dr Lawyer Pride office to arrange a follow up appt. Continue fall precautions.  Pending thigh ultrasound report.  If you need refills for medications you take chronically, please call your pharmacy. Do not use My Chart to request refills or for acute issues that need immediate attention. If you send a my chart message, it may take a few days to be addressed, specially if I am not in the office.  Please be sure medication list is accurate. If a new problem present, please set up appointment sooner than planned today.

## 2023-06-26 ENCOUNTER — Encounter: Payer: Self-pay | Admitting: Psychology

## 2023-06-26 ENCOUNTER — Ambulatory Visit (INDEPENDENT_AMBULATORY_CARE_PROVIDER_SITE_OTHER): Payer: Medicare Other | Admitting: Psychology

## 2023-06-26 DIAGNOSIS — F0280 Dementia in other diseases classified elsewhere without behavioral disturbance: Secondary | ICD-10-CM | POA: Diagnosis not present

## 2023-06-26 DIAGNOSIS — G309 Alzheimer's disease, unspecified: Secondary | ICD-10-CM

## 2023-06-26 NOTE — Progress Notes (Signed)
   Neuropsychology Feedback Session Tommas Fragmin. [facility] Department of Neurology  Reason for Referral:   Shannon [name] is a 78 y.o. right-handed Caucasian female referred by Tex Filbert, PA-C, to characterize her current cognitive functioning and assist with diagnostic clarity and treatment planning in the context of prior concerns for underlying Alzheimer's disease and progressive cognitive decline over time.   Feedback:   Shannon [name] completed a comprehensive neuropsychological evaluation on 06/18/2023. Please refer to that encounter for the full report and recommendations. Briefly, results suggested severe impairment surrounding all aspects of learning and memory. Additional severe impairments were exhibited across cognitive flexibility, semantic fluency, and confrontation naming. Processing speed represented a relative weakness while variability was exhibited across visuospatial abilities. Relative to her previous evaluation in November 2022, clinically significant decline was exhibited across several assessed domains, including processing speed, cognitive flexibility, verbal fluency (semantic worse than phonemic), visuoconstructional abilities, and all aspects of learning and memory. This decline was despite an improvement in acute psychiatric distress across mood-related questionnaires. Regarding the cause for her mild dementia presentation, I continue to have primary concerns surrounding underlying Alzheimer's disease. Across memory tasks, Shannon [name] was fully amnestic (i.e., 0% retention) across all three memory tasks after a brief delay and performed poorly across a list-based recognition trial. Taken together, this suggests evidence for rapid forgetting and a prominent storage impairment, both of which are the hallmark testing patterns for this illness. Severe impairment surrounding semantic fluency and confrontation naming follows typical and expected progression. This, coupled  with evidence for objective decline over time, further elevates the likelihood of this disease process.   Shannon [name] was accompanied by her son-in-law during the current feedback session. Content of the current session focused on the results of her neuropsychological evaluation. Shannon [name] was given the opportunity to ask questions and her questions were answered. She was encouraged to reach out should additional questions arise. A copy of her report was provided at the conclusion of the visit.      One unit 96132 (31 minutes) was billed for Dr. Arsenio Larger time spent preparing for, conducting, and documenting the current feedback session with Shannon [name].

## 2023-07-03 ENCOUNTER — Encounter: Payer: Self-pay | Admitting: Physician Assistant

## 2023-07-03 ENCOUNTER — Ambulatory Visit (INDEPENDENT_AMBULATORY_CARE_PROVIDER_SITE_OTHER): Admitting: Physician Assistant

## 2023-07-03 VITALS — Resp 18 | Ht 63.0 in

## 2023-07-03 DIAGNOSIS — G309 Alzheimer's disease, unspecified: Secondary | ICD-10-CM | POA: Diagnosis not present

## 2023-07-03 DIAGNOSIS — F0280 Dementia in other diseases classified elsewhere without behavioral disturbance: Secondary | ICD-10-CM | POA: Diagnosis not present

## 2023-07-03 MED ORDER — MEMANTINE HCL 10 MG PO TABS: ORAL_TABLET | ORAL | 3 refills | Status: AC

## 2023-07-03 NOTE — Patient Instructions (Signed)
 It was a pleasure to see you today at our office.   Recommendations:  Follow up in  6 months Continue donepezil  10 mg daily  Take Memantine  5 mg twice a day, then increase to 10 mg twice a day 2 weeks later Continue behavioral therapy for mood   Whom to call:  Memory  decline, memory medications: Call our office [phone]   For psychiatric meds, mood meds: Please have your primary care physician manage these medications.       For assessment of decision of mental capacity and competency:  Call Dr. Laverne Potter, geriatric psychiatrist at [phone]  For guidance in geriatric dementia issues please call [facility] [phone]     If you have any severe symptoms of a stroke, or other severe issues such as confusion,severe chills or fever, etc call 911 or go to the ER as you may need to be evaluated further   Feel free to visit Facebook page " [email or web address]" for tips of how to care for people with memory problems.     RECOMMENDATIONS FOR ALL PATIENTS WITH MEMORY PROBLEMS: 1. Continue to exercise (Recommend 30 minutes of walking everyday, or 3 hours every week) 2. Increase social interactions - continue going to [place] and enjoy social gatherings with friends and family 3. Eat healthy, avoid fried foods and eat more fruits and vegetables 4. Maintain adequate blood pressure, blood sugar, and blood cholesterol level. Reducing the risk of stroke and cardiovascular disease also helps promoting better memory. 5. Avoid stressful situations. Live a simple life and avoid aggravations. Organize your time and prepare for the next day in anticipation. 6. Sleep well, avoid any interruptions of sleep and avoid any distractions in the bedroom that may interfere with adequate sleep quality 7. Avoid sugar, avoid sweets as there is a strong link between excessive sugar intake, diabetes, and cognitive impairment We discussed the Mediterranean diet, which has been shown to help patients  reduce the risk of progressive memory disorders and reduces cardiovascular risk. This includes eating fish, eat fruits and green leafy vegetables, nuts like almonds and hazelnuts, walnuts, and also use olive oil. Avoid fast foods and fried foods as much as possible. Avoid sweets and sugar as sugar use has been linked to worsening of memory function.  There is always a concern of gradual progression of memory problems. If this is the case, then we may need to adjust level of care according to patient needs. Support, both to the patient and caregiver, should then be put into place.    FALL PRECAUTIONS: Be cautious when walking. Scan the area for obstacles that may increase the risk of trips and falls. When getting up in the mornings, sit up at the edge of the bed for a few minutes before getting out of bed. Consider elevating the bed at the head end to avoid drop of blood pressure when getting up. Walk always in a well-lit room (use night lights in the walls). Avoid area rugs or power cords from appliances in the middle of the walkways. Use a walker or a cane if necessary and consider physical therapy for balance exercise. Get your eyesight checked regularly.  FINANCIAL OVERSIGHT: Supervision, especially oversight when making financial decisions or transactions is also recommended.  HOME SAFETY: Consider the safety of the kitchen when operating appliances like stoves, microwave oven, and blender. Consider having supervision and share cooking responsibilities until no longer able to participate in those. Accidents with firearms and other hazards in the house  should be identified and addressed as well.   ABILITY TO BE LEFT ALONE: If patient is unable to contact 911 operator, consider using LifeLine, or when the need is there, arrange for someone to stay with patients. Smoking is a fire hazard, consider supervision or cessation. Risk of wandering should be assessed by caregiver and if detected at any point,  supervision and safe proof recommendations should be instituted.  MEDICATION SUPERVISION: Inability to self-administer medication needs to be constantly addressed. Implement a mechanism to ensure safe administration of the medications.   DRIVING: Regarding driving, in patients with progressive memory problems, driving will be impaired. We advise to have someone else do the driving if trouble finding directions or if minor accidents are reported. Independent driving assessment is available to determine safety of driving.   If you are interested in the driving assessment, you can contact the following:  [employer] in [place] [phone]  [facility] [phone]  [facility] [phone]  [facility] [phone] or [phone]

## 2023-07-03 NOTE — Progress Notes (Signed)
 Assessment/Plan:   Dementia due to Alzheimer's disease  Shannon [name] is a very pleasant 78 y.o. 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 Patient is currently on donepezil  10 mg daily and 5 mg nightly.  Cognitive decline is noted during this visit.  Based on the above diagnosis, we discussed increasing memantine  to full coverage at 10 mg twice daily, patient agrees to proceed. She needs more assistance with ADLs, no longer drives.     Follow up in  6 months. Continue donepezil  10 mg daily and increase memantine  to 10 mg twice daily, side effects discussed Continue to control mood as per psychiatry for GAD Recommend good control of her cardiovascular risk factors Continue to control mood as per PCP     Subjective:    This patient is accompanied in the office by her son who supplements the history.  Previous records as well as any outside records available were reviewed prior to todays visit. Patient was last seen on 08/31/2022 with MMSE 29/30    Any changes in memory since last visit? "About the same".  She does not like participating in outside activities including going to [place].  She has more difficulty with short-term memory, remembering names of people although she is able to recognize faces. repeats oneself?  Endorsed Disoriented when walking into a room? Denies    Leaving objects?  May misplace things such as the phone in the closet (after the family got her another one because they could not find it).   Wandering behavior?  denies   Any personality changes since last visit? "I complain a lot".  Has moments of irritability especially when she cannot remember, "she is aware that her  grandmother went through this and she is concerned she will end up like this".  Any worsening depression?:  Endorsed, has not been seeing the [facility]  Hallucinations or paranoia?  Denies.   Seizures? denies    Any sleep changes?  Denies vivid dreams, REM behavior or sleepwalking   Sleep apnea?   Endorsed, has not been using the CPAP.  Any hygiene concerns? Denies.  Independent of bathing and dressing?  Endorsed  Does the patient needs help with medications?  Daughter is in charge, prepares them weekly  Who is in charge of the finances?  Son is in charge     Any changes in appetite? Decreased, attributed to depression. Takes Ensure supplement Patient have trouble swallowing? Denies.   Does the patient cook? Not as much as before. No accidents  Any headaches? She has a history of sinusitis  Any vision changes? No Chronic back pain  Has polyarthritis.  Ambulates with difficulty?  She uses a cane for stability. Recent falls or head injuries? Denies.     Unilateral weakness, numbness or tingling?  She had has a chronic history of lower extremity tingling, with prior EMG negative Any tremors?  Denies   Any anosmia?  Denies   Any incontinence of urine?  Endorsed  Any bowel dysfunction?   Denies      Patient lives with her husband of 63 years   Does the patient drive? No longer drives   Neuropsych evaluation 06/26/2023, Dr. Kitty Perkins briefly, results suggested severe impairment surrounding all aspects of learning and  memory. Additional severe impairments were exhibited across cognitive flexibility, semantic fluency, and confrontation naming. Processing speed represented a relative weakness while variability was exhibited across visuospatial abilities. Relative to her previous evaluation in November 2022, clinically significant decline was exhibited across several assessed domains, including processing speed, cognitive flexibility, verbal fluency (semantic worse than phonemic), visuoconstructional abilities,  and all aspects of learning and memory. This decline was despite an improvement in acute psychiatric distress across mood-related questionnaires. Regarding the cause for her mild dementia presentation, I continue to have primary concerns surrounding underlying Alzheimer's disease. Across memory tasks, Ms. Khokhar was fully amnestic (i.e., 0% retention) across all three memory tasks after a brief delay and performed poorly across a list-based recognition trial. Taken together, this suggests evidence for rapid forgetting and a prominent storage impairment, both of which are the hallmark testing patterns for this illness. Severe impairment surrounding semantic fluency and confrontation naming follows typical and expected progression. This, coupled with evidence for objective decline over time, further elevates the likelihood of this disease process       Initial office visit 2021 had been a difficult year emotionally for her.  She is caring for her husband with dementia.  Her son passed away.  She started having headaches in December 2021.  It followed cataract surgery, so she thought it was due to the eye drops but it persisted after stopping the drops.  They were severe daily headaches that moved around different locations on her head.  She has had some memory problems for a couple of years which has been noticeable to herself and family over the past year.  When talking about recent events, she will forget, such as where she had lunch or what she did that day.  She will repeat herself 5 to 10 times during the same conversation.  Her daughter started managing her medication because she would sometimes forget to take her medications despite using a pill box.  So far, she has been paying her bills without difficulty.  She drives but denies difficulty or disorientation.  One time, she thought that she was playing hide and seek with her granddaughter who was not there.  On 02/18/2020, she exhibited altered mental  status.  She thought her mother who is long passed, was in the house cooking dinner. She went to the ED for further evaluation.  CT head personally reviewed unremarkable.  UA showed possible UTI and was prescribed Keflex .  CXR negative and labs negative for leukocytosis or electrolyte imbalance.  She was also dehydrated.  Followed up with primary care.  MRI of brain without contrast on 03/02/2020 personally reviewed showed mild cerebral atrophy with chronic small vessel ischemic changes and sequelae of prior right pterional craniotomy with no visible tumor recurrence but no acute abnormality.  Headaches now are infrequent, mild and lasting a couple of hours about 2 days a week.  TSH from January was 2.26.       MRI brain 04/2020 No acute intracranial abnormality or significant interval change. 2. Stable mild atrophy and white matter disease. This likely reflects the sequela of chronic microvascular ischemia. 3. No residual or recurrent sellar mass. Right pterional craniotomy noted.     Extensive electrodiagnostic testing of the right lower extremity and additional studies of the left shows:  Right superficial peroneal and left sural sensory responses are absent.  Right sural and left superficial peroneal sensory responses are within normal limits. Bilateral peroneal motor responses show reduced amplitude at the extensor digitorum brevis, and  is normal at the tibialis anterior.  Bilateral tibial motor responses are within normal limits. Bilateral tibial H reflex studies are within normal limits. There is no evidence of active or chronic motor axonal loss changes affecting any of the tested muscles.  Motor unit configuration and recruitment pattern is within normal limits.  Aaron Aas    PREVIOUS MEDICATIONS:   CURRENT MEDICATIONS:  Outpatient Encounter Medications as of 07/03/2023  Medication Sig   acetaminophen  (TYLENOL ) 325 MG tablet Take 2 tablets (650 mg total) by mouth every 6 (six) hours as needed for  mild pain or moderate pain (pain score 1-3 or temp > 100.5).   albuterol  (VENTOLIN  HFA) 108 (90 Base) MCG/ACT inhaler INHALE 1-2 PUFFS INTO THE LUNGS EVERY 6 (SIX) HOURS AS NEEDED FOR WHEEZING OR SHORTNESS OF BREATH.   alendronate  (FOSAMAX ) 70 MG tablet TAKE 1 TABLET EVERY 7 DAYS. TAKE WITH A FULL GLASS OF WATER  ON AN EMPTY STOMACH.   Calcium  Carbonate (CALCIUM  600 PO) Take 1 tablet by mouth daily.   carvedilol  (COREG ) 25 MG tablet TAKE 1 TABLET TWICE DAILY WITH MEALS   cetirizine (ZYRTEC) 10 MG tablet Take 10 mg by mouth at bedtime.   cholecalciferol  (VITAMIN D3) 25 MCG (1000 UNIT) tablet Take 1,000 Units by mouth daily.   diltiazem  (CARDIZEM  CD) 120 MG 24 hr capsule TAKE 1 CAPSULE EVERY DAY   docusate sodium  (COLACE) 100 MG capsule Take 1 capsule (100 mg total) by mouth 2 (two) times daily. (Patient taking differently: Take 100 mg by mouth 2 (two) times daily as needed.)   donepezil  (ARICEPT ) 10 MG tablet TAKE 1 TABLET AT BEDTIME   ELIQUIS  5 MG TABS tablet Take 5 mg by mouth 2 (two) times daily.   famotidine  (PEPCID ) 20 MG tablet TAKE 1 TABLET TWICE DAILY   ferrous sulfate  325 (65 FE) MG tablet Take 325 mg by mouth daily with breakfast.   folic acid  (FOLVITE ) 1 MG tablet Take 1 tablet (1 mg total) by mouth daily.   gabapentin  (NEURONTIN ) 300 MG capsule TAKE 1 CAPSULE TWICE DAILY   hydrocortisone 2.5 % ointment Apply topically.   hydrOXYzine  (ATARAX ) 25 MG tablet TAKE 0.5-1 TABLETS (12.5-25 MG TOTAL) BY MOUTH 2 (TWO) TIMES DAILY AS NEEDED. (Patient taking differently: Take 25 mg by mouth 2 (two) times daily as needed (allergies).)   lidocaine  (LIDODERM ) 5 % Place 1 patch onto the skin daily. Remove & Discard patch within 12 hours or as directed by MD   Multiple Vitamin (MULTIVITAMIN WITH MINERALS) TABS tablet Take 1 tablet by mouth daily.   omeprazole  (PRILOSEC) 20 MG capsule TAKE 1 CAPSULE BY MOUTH EVERY DAY   pentosan polysulfate (ELMIRON ) 100 MG capsule Take 200 mg by mouth in the morning and  at bedtime.   polyethylene glycol (MIRALAX  / GLYCOLAX ) 17 g packet Take 17 g by mouth daily as needed for mild constipation.   rosuvastatin  (CRESTOR ) 20 MG tablet TAKE 1 TABLET AT BEDTIME (Patient taking differently: Take 20 mg by mouth at bedtime.)   venlafaxine  (EFFEXOR ) 75 MG tablet Take 3 tablets (225 mg total) by mouth in the morning.   [DISCONTINUED] memantine  (NAMENDA ) 5 MG tablet Take 1 tablet (5 mg total) by mouth at bedtime.   memantine  (NAMENDA ) 10 MG tablet Take half tablet  twice a day for 2 weeks  (5 mg), then increase to 1 tablet twice a day   No facility-administered encounter medications on file as of 07/03/2023.       08/31/2022    4:00  PM 02/10/2022    8:00 PM 11/28/2021    1:38 PM  MMSE - Mini Mental State Exam  Orientation to time 5 4 5   Orientation to Place 5 5 5   Registration 3 3 3   Attention/ Calculation 5 5 5   Recall 3 3 3   Language- name 2 objects 2 2 2   Language- repeat 1 1 1   Language- follow 3 step command 3 3 3   Language- read & follow direction 1 1 1   Write a sentence 1 1 1   Copy design 0 1 1  Total score 29 29 30        No data to display          Objective:     PHYSICAL EXAMINATION:    VITALS:   Vitals:   07/03/23 1432  Resp: 18  Height: 5\' 3"  (1.6 m)    GEN:  The patient appears stated age and is in NAD. HEENT:  Normocephalic, atraumatic.   Neurological examination:  General: NAD, well-groomed, appears stated age. Orientation: The patient is alert. Oriented to person, place and date Cranial nerves: There is good facial symmetry.The speech is fluent and clear. No aphasia or dysarthria. Fund of knowledge is appropriate. Recent and remote memory are impaired. Attention and concentration are reduced. Able to name objects and repeat phrases.  Hearing is intact to conversational tone.   Sensation: Sensation is intact to light touch throughout Motor: Strength is at least antigravity x4. DTR's 2/4 in UE/LE     Movement  examination: Tone: There is normal tone in the UE/LE Abnormal movements:  no tremor.  No myoclonus.  No asterixis.   Coordination:  There is mild decremation with RAM's. Normal finger to nose  Gait and Station: The patient has no difficulty arising out of a deep-seated chair without the use of the hands. The patient's stride length is good.  Gait is cautious and narrow.    Thank you for allowing us  the opportunity to participate in the care of this nice patient. Please do not hesitate to contact us  for any questions or concerns.   Total time spent on today's visit was 33 minutes dedicated to this patient today, preparing to see patient, examining the patient, ordering tests and/or medications and counseling the patient, documenting clinical information in the EHR or other health record, independently interpreting results and communicating results to the patient/family, discussing treatment and goals, answering patient's questions and coordinating care.  Cc:  Neda Balk, MD  Tex Filbert 07/03/2023 3:52 PM

## 2023-07-25 ENCOUNTER — Telehealth: Payer: Self-pay | Admitting: Family Medicine

## 2023-07-25 NOTE — Telephone Encounter (Signed)
 Copied from CRM [id number]. Topic: Medicare AWV >> Jul 25, 2023  1:32 PM Nathanel DEL wrote: Reason for CRM: LVM 07/25/2023 to schedule AWV. Please schedule Virtual or Telehealth visits ONLY.   Nathanel Paschal; Care Guide Ambulatory Clinical Support [facility] Direct Dial: [phone]

## 2023-07-31 ENCOUNTER — Encounter: Payer: Self-pay | Admitting: Family Medicine

## 2023-08-06 ENCOUNTER — Other Ambulatory Visit: Payer: Self-pay | Admitting: Family Medicine

## 2023-10-08 NOTE — Assessment & Plan Note (Signed)
No recent exacerbation, no changes 

## 2023-10-08 NOTE — Assessment & Plan Note (Signed)
 Supplement and monitor

## 2023-10-08 NOTE — Assessment & Plan Note (Signed)
Tolerating Fosamax Encouraged to get adequate exercise, calcium and vitamin d intake °

## 2023-10-08 NOTE — Assessment & Plan Note (Signed)
 Well controlled, no changes to meds. Encouraged heart healthy diet such as the DASH diet and exercise as tolerated.

## 2023-10-11 ENCOUNTER — Encounter: Payer: Self-pay | Admitting: Family Medicine

## 2023-10-11 ENCOUNTER — Ambulatory Visit (INDEPENDENT_AMBULATORY_CARE_PROVIDER_SITE_OTHER): Admitting: Family Medicine

## 2023-10-11 VITALS — BP 138/86 | HR 65 | Resp 16 | Ht 63.0 in | Wt 152.8 lb

## 2023-10-11 DIAGNOSIS — E782 Mixed hyperlipidemia: Secondary | ICD-10-CM | POA: Diagnosis not present

## 2023-10-11 DIAGNOSIS — Z23 Encounter for immunization: Secondary | ICD-10-CM

## 2023-10-11 DIAGNOSIS — E559 Vitamin D deficiency, unspecified: Secondary | ICD-10-CM

## 2023-10-11 DIAGNOSIS — I10 Essential (primary) hypertension: Secondary | ICD-10-CM

## 2023-10-11 DIAGNOSIS — M810 Age-related osteoporosis without current pathological fracture: Secondary | ICD-10-CM

## 2023-10-11 DIAGNOSIS — R739 Hyperglycemia, unspecified: Secondary | ICD-10-CM | POA: Diagnosis not present

## 2023-10-11 DIAGNOSIS — I5032 Chronic diastolic (congestive) heart failure: Secondary | ICD-10-CM | POA: Diagnosis not present

## 2023-10-11 MED ORDER — VENLAFAXINE HCL ER 225 MG PO TB24: 1.0000 | ORAL_TABLET | Freq: Every day | ORAL | 1 refills | Status: AC

## 2023-10-11 NOTE — Patient Instructions (Addendum)
 Shingrix #2 Tetanus in 2027 unless injured Covid and flu annual boosters Prevnar 20 RSV, Respiratory Syncitial Virus Vaccine, Arexvy vaccine   Managing Loss, Adult People experience loss in many different ways throughout their lives. Events such as moving, changing jobs, and losing friends can create a sense of loss. The loss may be as serious as a major health change, divorce, death of a pet, or death of a loved one. All of these types of loss are likely to create a physical and emotional reaction known as grief. Grief is the result of a major change or an absence of something or someone that you count on. Grief is a normal reaction to loss. A variety of factors can affect your grieving experience, including: The nature of your loss. Your relationship to what or whom you lost. Your understanding of grief and how to manage it. Your support system. Be aware that when grief becomes extreme, it can lead to more severe issues like isolation, depression, anxiety, or suicidal thoughts. Talk with your health care provider if you have any of these issues. How to manage lifestyle changes Keep to your normal routine as much as possible. If you have trouble focusing or doing normal activities, it is acceptable to take some time away from your normal routine. Spend time with friends and loved ones. Eat a healthy diet, get plenty of sleep, and rest when you feel tired. How to recognize changes  The way that you deal with your grief will affect your ability to function as you normally do. When grieving, you may experience these changes: Numbness, shock, sadness, anxiety, anger, denial, and guilt. Thoughts about death. Unexpected crying. A physical sensation of emptiness in your stomach. Problems sleeping and eating. Tiredness (fatigue). Loss of interest in normal activities. Dreaming about or imagining seeing the person who died. A need to remember what or whom you lost. Difficulty thinking about  anything other than your loss for a period of time. Relief. If you have been expecting the loss for a while, you may feel a sense of relief when it happens. Follow these instructions at home: Activity Express your feelings in healthy ways, such as: Talking with others about your loss. It may be helpful to find others who have had a similar loss, such as a support group. Writing down your feelings in a journal. Doing physical activities to release stress and emotional energy. Doing creative activities like painting, sculpting, or playing or listening to music. Practicing resilience. This is the ability to recover and adjust after facing challenges. Reading some resources that encourage resilience may help you to learn ways to practice those behaviors.  General instructions Be patient with yourself and others. Allow the grieving process to happen, and remember that grieving takes time. It is likely that you may never feel completely done with some grief. You may find a way to move on while still cherishing memories and feelings about your loss. Accepting your loss is a process. It can take months or longer to adjust. Keep all follow-up visits. This is important. Where to find support To get support for managing loss: Ask your health care provider for help and recommendations, such as grief counseling or therapy. Think about joining a support group for people who are managing a loss. Where to find more information You can find more information about managing loss from: American Society of Clinical Oncology: www.cancer.net American Psychological Association: [employer] Contact a health care provider if: Your grief is extreme and keeps getting  worse. You have ongoing grief that does not improve. Your body shows symptoms of grief, such as illness. You feel depressed, anxious, or hopeless. Get help right away if: You have thoughts about hurting yourself or others. Get help right away if you  feel like you may hurt yourself or others, or have thoughts about taking your own life. Go to your nearest emergency room or: Call 911. Call the National Suicide Prevention Lifeline at 6705795667 or 988. This is open 24 hours a day. Text the Crisis Text Line at (913)297-6713. Summary Grief is the result of a major change or an absence of someone or something that you count on. Grief is a normal reaction to loss. The depth of grief and the period of recovery depend on the type of loss and your ability to adjust to the change and process your feelings. Processing grief requires patience and a willingness to accept your feelings and talk about your loss with people who are supportive. It is important to find resources that work for you and to realize that people experience grief differently. There is not one grieving process that works for everyone in the same way. Be aware that when grief becomes extreme, it can lead to more severe issues like isolation, depression, anxiety, or suicidal thoughts. Talk with your health care provider if you have any of these issues. This information is not intended to replace advice given to you by your health care provider. Make sure you discuss any questions you have with your health care provider. Document Revised: 09/06/2020 Document Reviewed: 09/06/2020 Elsevier Patient Education  2024 [employer].

## 2023-10-11 NOTE — Progress Notes (Signed)
 Subjective:    Patient ID: Shannon [name], female    DOB: Feb 24, 1945, 78 y.o.   MRN: [id number]  Chief Complaint  Patient presents with   Medical Management of Chronic Issues    Patient presents today for a year follow-up.   Quality Metric Gaps    AWV, zoster vaccines    HPI Discussed the use of AI scribe software for clinical note transcription with the patient, who gave verbal consent to proceed.  History of Present Illness Shannon [name] is a 78 year old female with depression and dementia who presents for medication management and nutritional counseling.  She is experiencing increased stress and grief following the recent loss of her husband. She has been on venlafaxine  for depression, previously on extended release, but recently switched to regular release tablets, taking three tablets daily. Her daughter manages her medications.  She has a history of dementia, with worsening short-term memory, especially under stress. She has been living independently but is considering moving to an independent living facility due to increasing anxiety about being alone.  Nutritionally, she has a poor appetite and has lost weight. Her daughter is concerned about her nutrition, particularly her protein intake, and ensures she has access to protein-rich foods and hydration. Her daughter prepares her meals and encourages her to eat small, frequent meals.  Her social history includes being a retired Public librarian. She has been grieving the loss of her husband and son, impacting her social interactions and daily activities. She is hesitant about moving to an independent living facility due to the cost and emotional burden of change. Her husband died just 2 weeks ago after a short illness and her son died a few years ago.  She manages her daily activities but is uncomfortable with driving long distances, limiting her driving to short trips to the grocery store and other nearby locations.    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  Formatting of this note might be different from the original.  Qualifier: Diagnosis of   By: [employer] LPN, Inocente      Last Assessment & Plan:   Formatting of this note might be different from the original.  Tolerating statin, encouraged heart healthy diet, avoid trans fats, minimi   Nella Salines lesion 06/21/2015   Neuropathy    NICM (nonischemic cardiomyopathy) 03/08/2010   likely  2/2 chemotx - a. Echo 2012: EF 45-50%;  b. Lex MV 2/12:  low risk, apical defect (small area of ischemia vs shifting breast atten);  c.  Echo 7/12: Normal wall thickness, EF 60-65%, normal wall motion, grade 1 diastolic dysfunction, mild LAE, PASP 32;   d. Lex MV 11/13:  EF 76%, no ischemia   OA (osteoarthritis) 09/07/2011   Obesity 09/28/2014   Obstructive sleep apnea 01/12/2021   Osteoporosis 03/07/2011   DEXA T score -2.6 AP spine 03/07/11    Formatting of this note might be different from the original. Formatting of this note might be different from the original. DEXA T score -2.6 AP spine 03/07/11   Last Assessment & Plan:  Formatting of this note might be different from the original. Encouraged to get adequate exercise, calcium  and vitamin d  intake   Pain in joint, ankle and foot 09/28/2014   Pain of right hip joint 02/09/2021   Palpitation 10/28/2020   Personal history of chemotherapy 2012   Personal history of radiation therapy 2012   Postoperative anemia due to acute blood loss 02/13/2013   Prediabetes 06/15/2009   Pulmonary embolism 11/29/2021   Occurred after hip replacement in 2023     Recurrent falls 12/28/2015   Right knee pain 07/10/2020   Shortness of breath    Sternal pain 11/06/2013   Syncope 02/27/2016   Urinary frequency 02/11/2020   UTI (urinary tract infection) 03/03/2012   Vertebral fracture, osteoporotic, sequela 04/05/2016   Vitamin D  deficiency 06/14/2017   Supplement and monitor    Past Surgical History:  Procedure Laterality Date   BREAST LUMPECTOMY  04/2009   RIGHT FOR BREAST CANCER-CHEMO/RADIATION X 1 YEAR   BREAST REDUCTION SURGERY Bilateral 05/04/2014   Procedure: MAMMARY REDUCTION  (BREAST);  Surgeon: Elna Pick, MD;  Location: [facility];  Service: Plastics;  Laterality: Bilateral;   CARPAL TUNNEL RELEASE     L wrist, ulnar nerve moved   CHOLECYSTECTOMY     CRANIOTOMY FOR TUMOR  2000   ELBOW SURGERY     left   KNEE ARTHROSCOPY  Left 10/12/2014   Procedure: LEFT KNEE ARTHROSCOPY ;  Surgeon: Dempsey Moan, MD;  Location: [facility];  Service: Orthopedics;  Laterality: Left;   KYPHOPLASTY N/A 03/30/2016   Procedure: THORACIC 12 KYPHOPLASTY;  Surgeon: Oneil Priestly, MD;  Location: [facility];  Service: Orthopedics;  Laterality: N/A;  THORACIC 12 KYPHOPLASTY   LEFT HEART CATHETERIZATION WITH CORONARY ANGIOGRAM N/A 12/16/2013   Procedure: LEFT HEART CATHETERIZATION WITH CORONARY ANGIOGRAM;  Surgeon: Peter M Swaziland, MD;  Location: [facility];  Service: Cardiovascular;  Laterality: N/A;   LIPOMA EXCISION  03/28/2009   right leg   MELANOMA EXCISION     PORT-A-CATH REMOVAL  11/30/2010   Streck   porta cath     PORTACATH PLACEMENT  may 2011   REDUCTION MAMMAPLASTY Bilateral    SYNOVECTOMY Left 10/12/2014   Procedure: WITH SYNOVECTOMY;  Surgeon: Dempsey Moan, MD;  Location: [facility];  Service: Orthopedics;  Laterality: Left;   TONSILLECTOMY  1958   TOTAL HIP ARTHROPLASTY Left 11/21/2021   Procedure: TOTAL HIP ARTHROPLASTY ANTERIOR APPROACH;  Surgeon: Fidel Rogue, MD;  Location: [facility];  Service: Orthopedics;  Laterality: Left;   TOTAL KNEE ARTHROPLASTY  02/05/2012   Procedure: TOTAL KNEE ARTHROPLASTY;  Surgeon: Dempsey LULLA Moan, MD;  Location: [facility];  Service: Orthopedics;  Laterality: Right;   TOTAL KNEE ARTHROPLASTY Left 02/10/2013   Procedure: LEFT TOTAL KNEE ARTHROPLASTY;  Surgeon: Dempsey LULLA Moan, MD;  Location: [facility];  Service: Orthopedics;  Laterality: Left;   total knee raplacement  01-2012   Right Knee   TOTAL KNEE REVISION Left 07/14/2020   Procedure: TOTAL KNEE REVISION;  Surgeon: Moan Dempsey, MD;  Location: [facility];  Service: Orthopedics;  Laterality: Left;   TUBAL LIGATION  1997    Family History  Problem Relation Age of Onset   Breast cancer Mother        sarcoma   Lung cancer Mother    Hypertension Mother    Prostate cancer Father    Congestive Heart Failure Father    Heart attack Father    Prostate cancer  Brother    Heart disease Maternal Grandfather        MI   Down syndrome Son    CVA Son    Stomach cancer Maternal Aunt    Dementia Maternal Aunt    Uterine cancer Maternal Aunt    Colon cancer Neg Hx     Social History   Socioeconomic History   Marital status: Married    Spouse name: Tawyna Pellot   Number of children: 3   Years of education: 12   Highest education level: 12th grade  Occupational History   Occupation: boutique owner-retired    Employer: Producer, television/film/video  Tobacco Use   Smoking status: Never    Passive exposure: Never   Smokeless tobacco: Never  Vaping Use   Vaping status: Never Used  Substance and Sexual Activity   Alcohol  use: No   Drug use: No   Sexual activity: Not Currently  Other Topics Concern   Not on file  Social History Narrative   Patient is right-handed. She lives with her husband in a 4 story home. They take care of their grown son with down syndrome who has had a stroke. She drinks 1-2 glasses of tea in the evenings. She does not formally exercise.      Social Drivers of Corporate investment banker Strain: Low Risk  (10/10/2023)   Overall Financial Resource Strain (CARDIA)    Difficulty of Paying Living Expenses: Not hard at all  Food Insecurity: No Food Insecurity (10/10/2023)   Hunger Vital Sign    Worried About Running Out of Food in the Last Year: Never true    Ran Out of Food in the Last Year: Never true  Transportation Needs: No Transportation Needs (10/10/2023)   PRAPARE - Administrator, Civil Service (Medical): No    Lack of Transportation (Non-Medical): No  Physical Activity: Inactive (10/10/2023)   Exercise Vital Sign    Days of Exercise per Week: 0 days    Minutes of Exercise per Session: Not on file  Stress: Stress Concern Present (10/10/2023)   [employer] of Occupational Health - Occupational Stress Questionnaire    Feeling of Stress: Rather much  Social Connections: Moderately Integrated (10/10/2023)    Social Connection and Isolation Panel    Frequency of Communication with Friends and Family: More  than three times a week    Frequency of Social Gatherings with Friends and Family: More than three times a week    Attends Religious Services: More than 4 times per year    Active Member of Clubs or Organizations: Yes    Attends Banker Meetings: More than 4 times per year    Marital Status: Widowed  Intimate Partner Violence: Not At Risk (02/15/2022)   Humiliation, Afraid, Rape, and Kick questionnaire    Fear of Current or Ex-Partner: No    Emotionally Abused: No    Physically Abused: No    Sexually Abused: No    Outpatient Medications Prior to Visit  Medication Sig Dispense Refill   acetaminophen  (TYLENOL ) 325 MG tablet Take 2 tablets (650 mg total) by mouth every 6 (six) hours as needed for mild pain or moderate pain (pain score 1-3 or temp > 100.5).     albuterol  (VENTOLIN  HFA) 108 (90 Base) MCG/ACT inhaler INHALE 1-2 PUFFS INTO THE LUNGS EVERY 6 (SIX) HOURS AS NEEDED FOR WHEEZING OR SHORTNESS OF BREATH. 18 each 3   alendronate  (FOSAMAX ) 70 MG tablet TAKE 1 TABLET EVERY 7 DAYS. TAKE WITH A FULL GLASS OF WATER  ON AN EMPTY STOMACH. 12 tablet 3   Calcium  Carbonate (CALCIUM  600 PO) Take 1 tablet by mouth daily.     carvedilol  (COREG ) 25 MG tablet TAKE 1 TABLET TWICE DAILY WITH MEALS 120 tablet 5   cetirizine (ZYRTEC) 10 MG tablet Take 10 mg by mouth at bedtime.     cholecalciferol  (VITAMIN D3) 25 MCG (1000 UNIT) tablet Take 1,000 Units by mouth daily.     diltiazem  (CARDIZEM  CD) 120 MG 24 hr capsule TAKE 1 CAPSULE EVERY DAY 90 capsule 3   docusate sodium  (COLACE) 100 MG capsule Take 1 capsule (100 mg total) by mouth 2 (two) times daily. (Patient taking differently: Take 100 mg by mouth 2 (two) times daily as needed.) 10 capsule 0   donepezil  (ARICEPT ) 10 MG tablet TAKE 1 TABLET AT BEDTIME 90 tablet 3   famotidine  (PEPCID ) 20 MG tablet TAKE 1 TABLET TWICE DAILY 180 tablet 3    ferrous sulfate  325 (65 FE) MG tablet Take 325 mg by mouth daily with breakfast.     folic acid  (FOLVITE ) 1 MG tablet Take 1 tablet (1 mg total) by mouth daily. 90 tablet 0   gabapentin  (NEURONTIN ) 300 MG capsule TAKE 1 CAPSULE TWICE DAILY 180 capsule 0   hydrOXYzine  (ATARAX ) 25 MG tablet TAKE 0.5-1 TABLETS (12.5-25 MG TOTAL) BY MOUTH 2 (TWO) TIMES DAILY AS NEEDED. (Patient taking differently: Take 25 mg by mouth 2 (two) times daily as needed (allergies).) 180 tablet 0   memantine  (NAMENDA ) 10 MG tablet Take half tablet  twice a day for 2 weeks  (5 mg), then increase to 1 tablet twice a day 180 tablet 3   Multiple Vitamin (MULTIVITAMIN WITH MINERALS) TABS tablet Take 1 tablet by mouth daily.     omeprazole  (PRILOSEC) 20 MG capsule TAKE 1 CAPSULE BY MOUTH EVERY DAY 90 capsule 1   pentosan polysulfate (ELMIRON ) 100 MG capsule Take 200 mg by mouth in the morning and at bedtime.     rosuvastatin  (CRESTOR ) 20 MG tablet TAKE 1 TABLET AT BEDTIME (Patient taking differently: Take 20 mg by mouth at bedtime.) 90 tablet 3   venlafaxine  (EFFEXOR ) 75 MG tablet Take 3 tablets (225 mg total) by mouth in the morning. 270 tablet 0   ELIQUIS  5 MG TABS tablet Take 5  mg by mouth 2 (two) times daily.     hydrocortisone 2.5 % ointment Apply topically.     lidocaine  (LIDODERM ) 5 % Place 1 patch onto the skin daily. Remove & Discard patch within 12 hours or as directed by MD 30 patch 0   polyethylene glycol (MIRALAX  / GLYCOLAX ) 17 g packet Take 17 g by mouth daily as needed for mild constipation. 14 each 0   No facility-administered medications prior to visit.    Allergies  Allergen Reactions   Dilaudid  [Hydromorphone  Hcl] Itching and Other (See Comments)    Went Crazy     Review of Systems  Constitutional:  Negative for fever and malaise/fatigue.  HENT:  Negative for congestion.   Eyes:  Negative for blurred vision.  Respiratory:  Negative for shortness of breath.   Cardiovascular:  Negative for chest pain,  palpitations and leg swelling.  Gastrointestinal:  Negative for abdominal pain, blood in stool and nausea.  Genitourinary:  Negative for dysuria and frequency.  Musculoskeletal:  Positive for joint pain. Negative for falls.  Skin:  Negative for rash.  Neurological:  Negative for dizziness, loss of consciousness and headaches.  Endo/Heme/Allergies:  Negative for environmental allergies.  Psychiatric/Behavioral:  Positive for depression and memory loss. Negative for substance abuse. The patient is not nervous/anxious.        Objective:    Physical Exam Constitutional:      General: She is not in acute distress.    Appearance: Normal appearance. She is well-developed. She is not toxic-appearing.  HENT:     Head: Normocephalic and atraumatic.     Right Ear: External ear normal.     Left Ear: External ear normal.     Nose: Nose normal.  Eyes:     General:        Right eye: No discharge.        Left eye: No discharge.     Conjunctiva/sclera: Conjunctivae normal.  Neck:     Thyroid : No thyromegaly.  Cardiovascular:     Rate and Rhythm: Normal rate and regular rhythm.     Heart sounds: Normal heart sounds. No murmur heard. Pulmonary:     Effort: Pulmonary effort is normal. No respiratory distress.     Breath sounds: Normal breath sounds.  Abdominal:     General: Bowel sounds are normal.     Palpations: Abdomen is soft.     Tenderness: There is no abdominal tenderness. There is no guarding.  Musculoskeletal:        General: Normal range of motion.     Cervical back: Neck supple.  Lymphadenopathy:     Cervical: No cervical adenopathy.  Skin:    General: Skin is warm and dry.  Neurological:     Mental Status: She is alert and oriented to person, place, and time.  Psychiatric:        Mood and Affect: Mood normal.        Behavior: Behavior normal.        Thought Content: Thought content normal.        Judgment: Judgment normal.     BP 138/86   Pulse 65   Resp 16   Ht 5'  3 (1.6 m)   Wt 152 lb 12.8 oz (69.3 kg)   LMP 01/30/1994   SpO2 95%   BMI 27.07 kg/m  Wt Readings from Last 3 Encounters:  10/11/23 152 lb 12.8 oz (69.3 kg)  06/19/23 147 lb 6 oz (66.8 kg)  06/05/23 145  lb (65.8 kg)    Diabetic Foot Exam - Simple   No data filed    Lab Results  Component Value Date   WBC 7.2 02/01/2023   HGB 13.9 02/01/2023   HCT 41.0 02/01/2023   PLT 283 02/01/2023   GLUCOSE 104 (H) 02/01/2023   CHOL 168 07/18/2022   TRIG 168.0 (H) 07/18/2022   HDL 47.20 07/18/2022   LDLDIRECT 89.0 06/23/2021   LDLCALC 88 07/18/2022   ALT 16 02/01/2023   AST 38 02/01/2023   NA 144 02/01/2023   K 3.4 (L) 02/01/2023   CL 104 02/01/2023   CREATININE 0.80 02/01/2023   BUN 14 02/01/2023   CO2 24 02/01/2023   TSH 2.75 07/18/2022   INR 1.1 02/01/2023   HGBA1C 5.5 07/18/2022    Lab Results  Component Value Date   TSH 2.75 07/18/2022   Lab Results  Component Value Date   WBC 7.2 02/01/2023   HGB 13.9 02/01/2023   HCT 41.0 02/01/2023   MCV 90.8 02/01/2023   PLT 283 02/01/2023   Lab Results  Component Value Date   NA 144 02/01/2023   K 3.4 (L) 02/01/2023   CHLORIDE 107 08/07/2016   CO2 24 02/01/2023   GLUCOSE 104 (H) 02/01/2023   BUN 14 02/01/2023   CREATININE 0.80 02/01/2023   BILITOT 0.9 02/01/2023   ALKPHOS 108 02/01/2023   AST 38 02/01/2023   ALT 16 02/01/2023   PROT 7.3 02/01/2023   ALBUMIN  4.2 02/01/2023   CALCIUM  9.5 02/01/2023   ANIONGAP 13 02/01/2023   EGFR 72 (L) 08/07/2016   GFR 66.70 07/18/2022   Lab Results  Component Value Date   CHOL 168 07/18/2022   Lab Results  Component Value Date   HDL 47.20 07/18/2022   Lab Results  Component Value Date   LDLCALC 88 07/18/2022   Lab Results  Component Value Date   TRIG 168.0 (H) 07/18/2022   Lab Results  Component Value Date   CHOLHDL 4 07/18/2022   Lab Results  Component Value Date   HGBA1C 5.5 07/18/2022       Assessment & Plan:  Chronic diastolic CHF (congestive heart  failure) Assessment & Plan: No recent exacerbation, no changes   Essential hypertension Assessment & Plan: Well controlled, no changes to meds. Encouraged heart healthy diet such as the DASH diet and exercise as tolerated.    Orders: -     Comprehensive metabolic panel with GFR -     CBC with Differential/Platelet  Vitamin D  deficiency Assessment & Plan: Supplement and monitor   Orders: -     VITAMIN D  25 Hydroxy (Vit-D Deficiency, Fractures)  Osteoporosis, unspecified osteoporosis type, unspecified pathological fracture presence Assessment & Plan: Tolerating Fosamax . Encouraged to get adequate exercise, calcium  and vitamin d  intake   Orders: -     VITAMIN D  25 Hydroxy (Vit-D Deficiency, Fractures)  Mixed hyperlipidemia -     TSH -     Lipid panel  Hyperglycemia -     Hemoglobin A1c  Need for influenza vaccination -     Flu vaccine HIGH DOSE PF(Fluzone Trivalent)  Other orders -     Venlafaxine  HCl ER; Take 1 tablet (225 mg total) by mouth daily.  Dispense: 90 tablet; Refill: 1    Assessment and Plan Assessment & Plan Mild neurocognitive disorder due to Alzheimer's disease Experiencing worsening short-term memory, especially under stress. Currently managing medications with family assistance. Consideration for transitioning to independent living, but not ready yet. - Continue  current medication management with family assistance. - Consider independent living arrangements if her condition progresses.  Major depressive disorder and bereavement Experiencing significant grief following the recent loss of her husband. Currently on venlafaxine , which was switched from extended release to regular release. Extended release formulation may provide more stable symptom control. - Switch venlafaxine  to extended release formulation at 225 mg daily, pending insurance approval. - Monitor for improvement in depressive symptoms and adjust treatment as needed. - Offer grief  counseling resources if desired. - Encourage regular follow-up to monitor mental health.  General Health Maintenance Discussion on maintaining hydration and nutrition, especially protein intake, to support overall health and muscle maintenance. Recommendations for vaccinations to prevent common illnesses in older adults. - Encourage regular protein intake every 3-4 hours, including options like boiled eggs, cottage cheese, yogurt, meat, beans, nuts, and protein drinks. - Encourage regular hydration with water , aiming for 5-10 ounces every 1-2 hours. - Administer flu shot today. - Consider RSV vaccine (Arexvy) and Prevnar 20 booster for pneumococcal protection. - Discuss second Shingrix vaccine with pharmacy for administration.  Recording duration: 32 minutes     Harlene Horton, MD

## 2023-10-11 NOTE — Addendum Note (Signed)
 Addended by: TRUDY CURVIN RAMAN on: 10/11/2023 04:36 PM   Modules accepted: Orders

## 2023-10-12 ENCOUNTER — Encounter: Payer: Self-pay | Admitting: Family Medicine

## 2023-10-15 ENCOUNTER — Other Ambulatory Visit (HOSPITAL_COMMUNITY): Payer: Self-pay

## 2023-10-15 ENCOUNTER — Other Ambulatory Visit: Payer: Self-pay | Admitting: *Deleted

## 2023-10-15 ENCOUNTER — Telehealth: Payer: Self-pay

## 2023-10-15 MED ORDER — OMEPRAZOLE 20 MG PO CPDR: 20.0000 mg | DELAYED_RELEASE_CAPSULE | Freq: Every day | ORAL | 1 refills | Status: AC

## 2023-10-15 NOTE — Telephone Encounter (Signed)
 Pharmacy Patient Advocate Encounter   Received notification from Patient Advice Request messages that prior authorization for Venlafaxine  ER 225mg  tabs is required/requested.   Insurance verification completed.   The patient is insured through [place] .   Per test claim: PA required; PA submitted to above mentioned insurance via Latent Key/confirmation #/EOC [id number] Status is pending

## 2023-10-15 NOTE — Telephone Encounter (Signed)
 Pharmacy Patient Advocate Encounter  Received notification from [employer] that Prior Authorization for Venlafaxine  ER 225mg  tabs has been APPROVED from 10/15/23 to 01/30/24. Ran test claim, Copay is $0. This test claim was processed through [facility]- copay amounts may vary at other pharmacies due to pharmacy/plan contracts, or as the patient moves through the different stages of their insurance plan.   PA #/Case ID/Reference #: [id number]

## 2023-10-18 ENCOUNTER — Other Ambulatory Visit (INDEPENDENT_AMBULATORY_CARE_PROVIDER_SITE_OTHER)

## 2023-10-18 DIAGNOSIS — E782 Mixed hyperlipidemia: Secondary | ICD-10-CM

## 2023-10-18 DIAGNOSIS — E559 Vitamin D deficiency, unspecified: Secondary | ICD-10-CM

## 2023-10-18 DIAGNOSIS — I10 Essential (primary) hypertension: Secondary | ICD-10-CM

## 2023-10-18 DIAGNOSIS — M810 Age-related osteoporosis without current pathological fracture: Secondary | ICD-10-CM

## 2023-10-18 DIAGNOSIS — R739 Hyperglycemia, unspecified: Secondary | ICD-10-CM | POA: Diagnosis not present

## 2023-10-18 NOTE — Addendum Note (Signed)
 Addended by: DORLENE CHIQUITA RAMAN on: 10/18/2023 02:36 PM   Modules accepted: Orders

## 2023-10-19 ENCOUNTER — Ambulatory Visit: Payer: Self-pay | Admitting: Family Medicine

## 2023-10-19 LAB — LIPID PANEL
Cholesterol: 153 mg/dL (ref 0–200)
HDL: 55.2 mg/dL (ref >39.00)
LDL Cholesterol: 75 mg/dL (ref 0–99)
NonHDL: 97.5
Total CHOL/HDL Ratio: 3
Triglycerides: 114 mg/dL (ref 0.0–149.0)
VLDL: 22.8 mg/dL (ref 0.0–40.0)

## 2023-10-19 LAB — CBC WITH DIFFERENTIAL/PLATELET
Basophils Absolute: 0.1 K/uL (ref 0.0–0.1)
Basophils Relative: 1.3 % (ref 0.0–3.0)
Eosinophils Absolute: 0.1 K/uL (ref 0.0–0.7)
Eosinophils Relative: 2.2 % (ref 0.0–5.0)
HCT: 37.7 % (ref 36.0–46.0)
Hemoglobin: 12.4 g/dL (ref 12.0–15.0)
Lymphocytes Relative: 22.4 % (ref 12.0–46.0)
Lymphs Abs: 1.5 K/uL (ref 0.7–4.0)
MCHC: 32.8 g/dL (ref 30.0–36.0)
MCV: 88.9 fl (ref 78.0–100.0)
Monocytes Absolute: 0.6 K/uL (ref 0.1–1.0)
Monocytes Relative: 8.8 % (ref 3.0–12.0)
Neutro Abs: 4.2 K/uL (ref 1.4–7.7)
Neutrophils Relative %: 65.3 % (ref 43.0–77.0)
Platelets: 210 K/uL (ref 150.0–400.0)
RBC: 4.23 Mil/uL (ref 3.87–5.11)
RDW: 14.4 % (ref 11.5–15.5)
WBC: 6.5 K/uL (ref 4.0–10.5)

## 2023-10-19 LAB — COMPREHENSIVE METABOLIC PANEL WITH GFR
ALT: 23 U/L (ref 0–35)
AST: 29 U/L (ref 0–37)
Albumin: 4.4 g/dL (ref 3.5–5.2)
Alkaline Phosphatase: 82 U/L (ref 39–117)
BUN: 29 mg/dL — ABNORMAL HIGH (ref 6–23)
CO2: 30 meq/L (ref 19–32)
Calcium: 9.3 mg/dL (ref 8.4–10.5)
Chloride: 104 meq/L (ref 96–112)
Creatinine, Ser: 1.09 mg/dL (ref 0.40–1.20)
GFR: 48.37 mL/min — ABNORMAL LOW (ref >60.00)
Glucose, Bld: 85 mg/dL (ref 70–99)
Potassium: 4.2 meq/L (ref 3.5–5.1)
Sodium: 142 meq/L (ref 135–145)
Total Bilirubin: 0.3 mg/dL (ref 0.2–1.2)
Total Protein: 6.5 g/dL (ref 6.0–8.3)

## 2023-10-19 LAB — TSH: TSH: 2.18 u[IU]/mL (ref 0.35–5.50)

## 2023-10-19 LAB — HEMOGLOBIN A1C: Hgb A1c MFr Bld: 5.5 % (ref 4.6–6.5)

## 2023-10-19 LAB — VITAMIN D 25 HYDROXY (VIT D DEFICIENCY, FRACTURES): VITD: 54.05 ng/mL (ref 30.00–100.00)

## 2023-10-19 NOTE — Progress Notes (Signed)
 Patient reviewed via MyChart.

## 2023-10-31 ENCOUNTER — Other Ambulatory Visit: Payer: Self-pay | Admitting: Cardiology

## 2023-10-31 DIAGNOSIS — R002 Palpitations: Secondary | ICD-10-CM

## 2023-11-26 ENCOUNTER — Ambulatory Visit: Admitting: Family Medicine

## 2023-12-06 ENCOUNTER — Encounter: Payer: Self-pay | Admitting: Family Medicine

## 2023-12-09 ENCOUNTER — Other Ambulatory Visit: Payer: Self-pay | Admitting: Family Medicine

## 2023-12-09 MED ORDER — CARVEDILOL 25 MG PO TABS: 25.0000 mg | ORAL_TABLET | Freq: Two times a day (BID) | ORAL | 3 refills | Status: AC

## 2023-12-09 MED ORDER — ROSUVASTATIN CALCIUM 20 MG PO TABS: ORAL_TABLET | ORAL | 3 refills | Status: AC

## 2023-12-17 ENCOUNTER — Other Ambulatory Visit: Payer: Self-pay | Admitting: Family Medicine

## 2023-12-17 DIAGNOSIS — G629 Polyneuropathy, unspecified: Secondary | ICD-10-CM

## 2024-01-04 ENCOUNTER — Encounter: Payer: Self-pay | Admitting: Physician Assistant

## 2024-01-08 ENCOUNTER — Ambulatory Visit (INDEPENDENT_AMBULATORY_CARE_PROVIDER_SITE_OTHER): Admitting: Physician Assistant

## 2024-01-08 ENCOUNTER — Encounter: Payer: Self-pay | Admitting: Physician Assistant

## 2024-01-08 VITALS — BP 161/92 | HR 110 | Resp 20 | Ht 63.0 in | Wt 148.0 lb

## 2024-01-08 DIAGNOSIS — G309 Alzheimer's disease, unspecified: Secondary | ICD-10-CM | POA: Diagnosis not present

## 2024-01-08 DIAGNOSIS — F0280 Dementia in other diseases classified elsewhere without behavioral disturbance: Secondary | ICD-10-CM | POA: Diagnosis not present
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
## 2024-01-08 NOTE — Patient Instructions (Signed)
 It was a pleasure to see you today at our office.   Recommendations:  Follow up in  6 months Continue donepezil  10 mg daily, may try 20 mg daily and see and if tolerated will increase dose to 23 mg  Take Memantine  10 mg twice a day  Resume behavioral therapy for mood   Whom to call:  Memory  decline, memory medications: Call our office [phone]   For psychiatric meds, mood meds: Please have your primary care physician manage these medications.       For assessment of decision of mental capacity and competency:  Call Dr. Rosaline Nine, geriatric psychiatrist at [phone]  For guidance in geriatric dementia issues please call [facility] [phone]     If you have any severe symptoms of a stroke, or other severe issues such as confusion,severe chills or fever, etc call 911 or go to the ER as you may need to be evaluated further        RECOMMENDATIONS FOR ALL PATIENTS WITH MEMORY PROBLEMS: 1. Continue to exercise (Recommend 30 minutes of walking everyday, or 3 hours every week) 2. Increase social interactions - continue going to [place] and enjoy social gatherings with friends and family 3. Eat healthy, avoid fried foods and eat more fruits and vegetables 4. Maintain adequate blood pressure, blood sugar, and blood cholesterol level. Reducing the risk of stroke and cardiovascular disease also helps promoting better memory. 5. Avoid stressful situations. Live a simple life and avoid aggravations. Organize your time and prepare for the next day in anticipation. 6. Sleep well, avoid any interruptions of sleep and avoid any distractions in the bedroom that may interfere with adequate sleep quality 7. Avoid sugar, avoid sweets as there is a strong link between excessive sugar intake, diabetes, and cognitive impairment We discussed the Mediterranean diet, which has been shown to help patients reduce the risk of progressive memory disorders and reduces cardiovascular  risk. This includes eating fish, eat fruits and green leafy vegetables, nuts like almonds and hazelnuts, walnuts, and also use olive oil. Avoid fast foods and fried foods as much as possible. Avoid sweets and sugar as sugar use has been linked to worsening of memory function.  There is always a concern of gradual progression of memory problems. If this is the case, then we may need to adjust level of care according to patient needs. Support, both to the patient and caregiver, should then be put into place.    FALL PRECAUTIONS: Be cautious when walking. Scan the area for obstacles that may increase the risk of trips and falls. When getting up in the mornings, sit up at the edge of the bed for a few minutes before getting out of bed. Consider elevating the bed at the head end to avoid drop of blood pressure when getting up. Walk always in a well-lit room (use night lights in the walls). Avoid area rugs or power cords from appliances in the middle of the walkways. Use a walker or a cane if necessary and consider physical therapy for balance exercise. Get your eyesight checked regularly.  FINANCIAL OVERSIGHT: Supervision, especially oversight when making financial decisions or transactions is also recommended.  HOME SAFETY: Consider the safety of the kitchen when operating appliances like stoves, microwave oven, and blender. Consider having supervision and share cooking responsibilities until no longer able to participate in those. Accidents with firearms and other hazards in the house should be identified and addressed as well.   ABILITY TO BE  LEFT ALONE: If patient is unable to contact 911 operator, consider using [facility], or when the need is there, arrange for someone to stay with patients. Smoking is a fire hazard, consider supervision or cessation. Risk of wandering should be assessed by caregiver and if detected at any point, supervision and safe proof recommendations should be  instituted.  MEDICATION SUPERVISION: Inability to self-administer medication needs to be constantly addressed. Implement a mechanism to ensure safe administration of the medications.

## 2024-01-27 ENCOUNTER — Other Ambulatory Visit: Payer: Self-pay | Admitting: Family Medicine

## 2024-02-12 ENCOUNTER — Other Ambulatory Visit: Payer: Self-pay | Admitting: Physician Assistant

## 2024-02-17 NOTE — Assessment & Plan Note (Signed)
 Encourage heart healthy diet such as MIND or DASH diet, increase exercise, avoid trans fats, simple carbohydrates and processed foods, consider a krill or fish or flaxseed oil cap daily. Tolerating Rosuvastatin

## 2024-02-17 NOTE — Progress Notes (Unsigned)
 "  Subjective:    Patient ID: Shannon [name], female    DOB: Dec 24, 1945, 79 y.o.   MRN: [id number]  No chief complaint on file.   HPI Discussed the use of AI scribe software for clinical note transcription with the patient, who gave verbal consent to proceed.  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 LPN, Inocente Deal of this note might be different from the original.  Formatting of this note might be  different from the original.  Qualifier: Diagnosis of   By: [employer] LPN, Inocente      Last Assessment & Plan:   Formatting of this note might be different from the original.  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 Echo 2012: EF 45-50%;  b. Lex MV 2/12:  low risk, apical defect (small area of ischemia vs shifting breast atten);  c.  Echo 7/12: Normal wall thickness, EF 60-65%, normal wall motion, grade 1 diastolic dysfunction, mild LAE, PASP 32;   d. Lex MV 11/13:  EF 76%, no ischemia   OA (osteoarthritis) 09/07/2011   Obesity 09/28/2014   Obstructive sleep apnea 01/12/2021   Osteoporosis 03/07/2011   DEXA T score -2.6 AP spine 03/07/11    Formatting of this note might be different from the original. Formatting of this note might be different from the original. DEXA T score -2.6 AP spine 03/07/11   Last Assessment & Plan:  Formatting of this note might be different from the original. Encouraged to get adequate exercise, calcium  and vitamin d  intake   Pain in joint, ankle and foot 09/28/2014   Pain of right hip joint 02/09/2021   Palpitation 10/28/2020   Personal history of chemotherapy 2012   Personal history of radiation  therapy 2012   Postoperative anemia due to acute blood loss 02/13/2013   Prediabetes 06/15/2009   Pulmonary embolism 11/29/2021   Occurred after hip replacement in 2023     Recurrent falls 12/28/2015   Right knee pain 07/10/2020   Shortness of breath    Sternal pain 11/06/2013   Syncope 02/27/2016   Urinary frequency 02/11/2020   UTI (urinary tract infection) 03/03/2012   Vertebral fracture, osteoporotic, sequela 04/05/2016   Vitamin D  deficiency 06/14/2017   Supplement and monitor    Past Surgical History:  Procedure Laterality Date   BREAST LUMPECTOMY  04/2009   RIGHT FOR BREAST  CANCER-CHEMO/RADIATION X 1 YEAR   BREAST REDUCTION SURGERY Bilateral 05/04/2014   Procedure: MAMMARY REDUCTION  (BREAST);  Surgeon: Elna Pick, MD;  Location: [facility];  Service: Plastics;  Laterality: Bilateral;   CARPAL TUNNEL RELEASE     L wrist, ulnar nerve moved   CHOLECYSTECTOMY     CRANIOTOMY FOR TUMOR  2000   ELBOW SURGERY     left   KNEE ARTHROSCOPY Left 10/12/2014   Procedure: LEFT KNEE ARTHROSCOPY ;  Surgeon: Dempsey Moan, MD;  Location: [facility];  Service: Orthopedics;  Laterality: Left;   KYPHOPLASTY N/A 03/30/2016   Procedure: THORACIC 12 KYPHOPLASTY;  Surgeon: Oneil Priestly, MD;  Location: [facility];  Service: Orthopedics;  Laterality: N/A;  THORACIC 12 KYPHOPLASTY   LEFT HEART CATHETERIZATION WITH CORONARY ANGIOGRAM N/A 12/16/2013   Procedure: LEFT HEART CATHETERIZATION WITH CORONARY ANGIOGRAM;  Surgeon: Peter M Jordan, MD;  Location[facility];  Service: Cardiovascular;  Laterality: N/A;   LIPOMA EXCISION  03/28/2009   right leg   MELANOMA EXCISION     PORT-A-CATH REMOVAL  11/30/2010   Streck   porta cath     PORTACATH PLACEMENT  may 2011   REDUCTION MAMMAPLASTY Bilateral    SYNOVECTOMY Left 10/12/2014   Procedure: WITH SYNOVECTOMY;  Surgeon: Dempsey Moan, MD;  Location: [facility];  Service: Orthopedics;  Laterality: Left;   TONSILLECTOMY  1958   TOTAL HIP ARTHROPLASTY Left 11/21/2021   Procedure: TOTAL HIP ARTHROPLASTY ANTERIOR APPROACH;  Surgeon: Fidel Rogue, MD;  Location: [facility];  Service: Orthopedics;  Laterality: Left;   TOTAL KNEE ARTHROPLASTY  02/05/2012   Procedure: TOTAL KNEE ARTHROPLASTY;  Surgeon: Dempsey LULLA Moan, MD;  Location: [facility];  Service: Orthopedics;  Laterality: Right;   TOTAL KNEE ARTHROPLASTY Left 02/10/2013   Procedure: LEFT TOTAL KNEE ARTHROPLASTY;  Surgeon: Dempsey LULLA Moan, MD;  Location: [facility];  Service: Orthopedics;  Laterality: Left;   total knee raplacement  01-2012   Right Knee   TOTAL KNEE REVISION  Left 07/14/2020   Procedure: TOTAL KNEE REVISION;  Surgeon: Moan Dempsey, MD;  Location: [facility];  Service: Orthopedics;  Laterality: Left;   TUBAL LIGATION  1997    Family History  Problem Relation Age of Onset   Breast cancer Mother        sarcoma   Lung cancer Mother    Hypertension Mother    Prostate cancer Father    Congestive Heart Failure Father    Heart attack Father    Prostate cancer Brother    Heart disease Maternal Grandfather        MI   Down syndrome Son    CVA Son    Stomach cancer Maternal Aunt    Dementia Maternal Aunt    Uterine cancer Maternal Aunt    Colon cancer Neg Hx     Social History  Socioeconomic History   Marital status: Married    Spouse name: Melika Reder   Number of children: 3   Years of education: 12   Highest education level: 12th grade  Occupational History   Occupation: boutique owner-retired    Employer: [employer], [employer]  Tobacco Use   Smoking status: Never    Passive exposure: Never   Smokeless tobacco: Never  Vaping Use   Vaping status: Never Used  Substance and Sexual Activity   Alcohol  use: No   Drug use: No   Sexual activity: Not Currently  Other Topics Concern   Not on file  Social History Narrative   Patient is right-handed. She lives with her husband in a 4 story home. They take care of their grown son with down syndrome who has had a stroke. She drinks 1-2 glasses of tea in the evenings. She does not formally exercise.      Social Drivers of Health   Tobacco Use: Low Risk (01/08/2024)   Patient History    Smoking Tobacco Use: Never    Smokeless Tobacco Use: Never    Passive Exposure: Never  Financial Resource Strain: Low Risk (10/10/2023)   Overall Financial Resource Strain (CARDIA)    Difficulty of Paying Living Expenses: Not hard at all  Food Insecurity: No Food Insecurity (10/10/2023)   Epic    Worried About Programme Researcher, Broadcasting/film/video in the Last Year: Never true    Ran Out of Food  in the Last Year: Never true  Transportation Needs: No Transportation Needs (10/10/2023)   Epic    Lack of Transportation (Medical): No    Lack of Transportation (Non-Medical): No  Physical Activity: Inactive (10/10/2023)   Exercise Vital Sign    Days of Exercise per Week: 0 days    Minutes of Exercise per Session: Not on file  Stress: Stress Concern Present (10/10/2023)   [employer] of Occupational Health - Occupational Stress Questionnaire    Feeling of Stress: Rather much  Social Connections: Moderately Integrated (10/10/2023)   Social Connection and Isolation Panel    Frequency of Communication with Friends and Family: More than three times a week    Frequency of Social Gatherings with Friends and Family: More than three times a week    Attends Religious Services: More than 4 times per year    Active Member of [employer] or Organizations: Yes    Attends Banker Meetings: More than 4 times per year    Marital Status: Widowed  Intimate Partner Violence: Not At Risk (02/15/2022)   Humiliation, Afraid, Rape, and Kick questionnaire    Fear of Current or Ex-Partner: No    Emotionally Abused: No    Physically Abused: No    Sexually Abused: No  Depression (PHQ2-9): Medium Risk (10/11/2023)   Depression (PHQ2-9)    PHQ-2 Score: 10  Alcohol  Screen: Low Risk (10/10/2023)   Alcohol  Screen    Last Alcohol  Screening Score (AUDIT): 1  Housing: Unknown (10/10/2023)   Epic    Unable to Pay for Housing in the Last Year: No    Number of Times Moved in the Last Year: Not on file    Homeless in the Last Year: No  Utilities: Not At Risk (02/15/2022)   AHC Utilities    Threatened with loss of utilities: No  Health Literacy: Not on file    Outpatient Medications Prior to Visit  Medication Sig Dispense Refill   acetaminophen  (TYLENOL ) 325 MG tablet Take 2 tablets (650 mg total)  by mouth every 6 (six) hours as needed for mild pain or moderate pain (pain score 1-3 or  temp > 100.5).     albuterol  (VENTOLIN  HFA) 108 (90 Base) MCG/ACT inhaler INHALE 1-2 PUFFS INTO THE LUNGS EVERY 6 (SIX) HOURS AS NEEDED FOR WHEEZING OR SHORTNESS OF BREATH. 18 each 3   alendronate  (FOSAMAX ) 70 MG tablet Take 1 tablet (70 mg total) by mouth once a week. Take with full glass of water  on empty stomach 12 tablet 3   Calcium  Carbonate (CALCIUM  600 PO) Take 1 tablet by mouth daily.     carvedilol  (COREG ) 25 MG tablet Take 1 tablet (25 mg total) by mouth 2 (two) times daily with a meal. 180 tablet 3   cetirizine (ZYRTEC) 10 MG tablet Take 10 mg by mouth at bedtime.     cholecalciferol  (VITAMIN D3) 25 MCG (1000 UNIT) tablet Take 1,000 Units by mouth daily.     diltiazem  (CARDIZEM  CD) 120 MG 24 hr capsule TAKE 1 CAPSULE EVERY DAY 90 capsule 3   docusate sodium  (COLACE) 100 MG capsule Take 1 capsule (100 mg total) by mouth 2 (two) times daily. (Patient taking differently: Take 100 mg by mouth 2 (two) times daily as needed.) 10 capsule 0   donepezil  (ARICEPT ) 10 MG tablet TAKE 1 TABLET AT BEDTIME 90 tablet 3   famotidine  (PEPCID ) 20 MG tablet TAKE 1 TABLET TWICE DAILY 180 tablet 3   ferrous sulfate  325 (65 FE) MG tablet Take 325 mg by mouth daily with breakfast.     folic acid  (FOLVITE ) 1 MG tablet Take 1 tablet (1 mg total) by mouth daily. 90 tablet 0   gabapentin  (NEURONTIN ) 300 MG capsule Take 1 capsule (300 mg total) by mouth 2 (two) times daily. 180 capsule 1   hydrOXYzine  (ATARAX ) 25 MG tablet TAKE 0.5-1 TABLETS (12.5-25 MG TOTAL) BY MOUTH 2 (TWO) TIMES DAILY AS NEEDED. (Patient taking differently: Take 25 mg by mouth 2 (two) times daily as needed (allergies).) 180 tablet 0   memantine  (NAMENDA ) 10 MG tablet Take half tablet  twice a day for 2 weeks  (5 mg), then increase to 1 tablet twice a day 180 tablet 3   Multiple Vitamin (MULTIVITAMIN WITH MINERALS) TABS tablet Take 1 tablet by mouth daily.     omeprazole  (PRILOSEC) 20 MG capsule Take 1 capsule (20 mg total) by  mouth daily. 90 capsule 1   pentosan polysulfate (ELMIRON ) 100 MG capsule Take 200 mg by mouth in the morning and at bedtime.     rosuvastatin  (CRESTOR ) 20 MG tablet TAKE 1 TABLET AT BEDTIME 90 tablet 3   Venlafaxine  HCl 225 MG TB24 Take 1 tablet (225 mg total) by mouth daily. 90 tablet 1   No facility-administered medications prior to visit.    Allergies[1]  Review of Systems  Constitutional:  Negative for fever and malaise/fatigue.  HENT:  Negative for congestion.   Eyes:  Negative for blurred vision.  Respiratory:  Negative for shortness of breath.   Cardiovascular:  Negative for chest pain, palpitations and leg swelling.  Gastrointestinal:  Negative for abdominal pain, blood in stool and nausea.  Genitourinary:  Negative for dysuria and frequency.  Musculoskeletal:  Negative for falls.  Skin:  Negative for rash.  Neurological:  Negative for dizziness, loss of consciousness and headaches.  Endo/Heme/Allergies:  Negative for environmental allergies.  Psychiatric/Behavioral:  Negative for depression. The patient is not nervous/anxious.        Objective:    Physical Exam Constitutional:  General: She is not in acute distress.    Appearance: Normal appearance. She is well-developed. She is not toxic-appearing.  HENT:     Head: Normocephalic and atraumatic.     Right Ear: External ear normal.     Left Ear: External ear normal.     Nose: Nose normal.  Eyes:     General:        Right eye: No discharge.        Left eye: No discharge.     Conjunctiva/sclera: Conjunctivae normal.  Neck:     Thyroid : No thyromegaly.  Cardiovascular:     Rate and Rhythm: Normal rate and regular rhythm.     Heart sounds: Normal heart sounds. No murmur heard. Pulmonary:     Effort: Pulmonary effort is normal. No respiratory distress.     Breath sounds: Normal breath sounds.  Abdominal:     General: Bowel sounds are normal.     Palpations: Abdomen is soft.     Tenderness: There is no  abdominal tenderness. There is no guarding.  Musculoskeletal:        General: Normal range of motion.     Cervical back: Neck supple.  Lymphadenopathy:     Cervical: No cervical adenopathy.  Skin:    General: Skin is warm and dry.  Neurological:     Mental Status: She is alert and oriented to person, place, and time.  Psychiatric:        Mood and Affect: Mood normal.        Behavior: Behavior normal.        Thought Content: Thought content normal.        Judgment: Judgment normal.    LMP 01/30/1994  Wt Readings from Last 3 Encounters:  01/08/24 148 lb (67.1 kg)  10/11/23 152 lb 12.8 oz (69.3 kg)  06/19/23 147 lb 6 oz (66.8 kg)    Diabetic Foot Exam - Simple   No data filed    Lab Results  Component Value Date   WBC 6.5 10/18/2023   HGB 12.4 10/18/2023   HCT 37.7 10/18/2023   PLT 210.0 10/18/2023   GLUCOSE 85 10/18/2023   CHOL 153 10/18/2023   TRIG 114.0 10/18/2023   HDL 55.20 10/18/2023   LDLDIRECT 89.0 06/23/2021   LDLCALC 75 10/18/2023   ALT 23 10/18/2023   AST 29 10/18/2023   NA 142 10/18/2023   K 4.2 10/18/2023   CL 104 10/18/2023   CREATININE 1.09 10/18/2023   BUN 29 (H) 10/18/2023   CO2 30 10/18/2023   TSH 2.18 10/18/2023   INR 1.1 02/01/2023   HGBA1C 5.5 10/18/2023    Lab Results  Component Value Date   TSH 2.18 10/18/2023   Lab Results  Component Value Date   WBC 6.5 10/18/2023   HGB 12.4 10/18/2023   HCT 37.7 10/18/2023   MCV 88.9 10/18/2023   PLT 210.0 10/18/2023   Lab Results  Component Value Date   NA 142 10/18/2023   K 4.2 10/18/2023   CHLORIDE 107 08/07/2016   CO2 30 10/18/2023   GLUCOSE 85 10/18/2023   BUN 29 (H) 10/18/2023   CREATININE 1.09 10/18/2023   BILITOT 0.3 10/18/2023   ALKPHOS 82 10/18/2023   AST 29 10/18/2023   ALT 23 10/18/2023   PROT 6.5 10/18/2023   ALBUMIN  4.4 10/18/2023   CALCIUM  9.3 10/18/2023   ANIONGAP 13 02/01/2023   EGFR 72 (L) 08/07/2016   GFR 48.37 (L) 10/18/2023   Lab Results  Component  Value  Date   CHOL 153 10/18/2023   Lab Results  Component Value Date   HDL 55.20 10/18/2023   Lab Results  Component Value Date   LDLCALC 75 10/18/2023   Lab Results  Component Value Date   TRIG 114.0 10/18/2023   Lab Results  Component Value Date   CHOLHDL 3 10/18/2023   Lab Results  Component Value Date   HGBA1C 5.5 10/18/2023       Assessment & Plan:  Vitamin D  deficiency Assessment & Plan: Supplement and monitor    Osteoporosis, unspecified osteoporosis type, unspecified pathological fracture presence Assessment & Plan: Tolerating Fosamax . 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  Tolerating Rosuvastatin    Essential hypertension Assessment & Plan: Well controlled, no changes to meds. Encouraged heart healthy diet such as the DASH diet and exercise as tolerated.     Obesity, unspecified class, unspecified obesity type, unspecified whether serious comorbidity present Assessment & Plan: Encouraged DASH or MIND diet, decrease po intake and increase exercise as tolerated. Needs 7-8 hours of sleep nightly. Avoid trans fats, eat small, frequent meals every 4-5 hours with lean proteins, complex carbs and healthy fats. Minimize simple carbs, high fat foods and processed foods   Prediabetes Assessment & Plan: hgba1c acceptable, minimize simple carbs. Increase exercise as tolerated. Continue current meds      Assessment and Plan Assessment & Plan      Harlene Horton, MD     [1] Allergies Allergen Reactions   Dilaudid  [Hydromorphone  Hcl] Itching and Other (See Comments)    Went Crazy   "

## 2024-02-17 NOTE — Assessment & Plan Note (Signed)
Following with neurology, stable on current meds 

## 2024-02-17 NOTE — Assessment & Plan Note (Signed)
 Well controlled, no changes to meds. Encouraged heart healthy diet such as the DASH diet and exercise as tolerated.

## 2024-02-17 NOTE — Assessment & Plan Note (Signed)
Tolerating Fosamax Encouraged to get adequate exercise, calcium and vitamin d intake °

## 2024-02-17 NOTE — Assessment & Plan Note (Signed)
 hgba1c acceptable, minimize simple carbs. Increase exercise as tolerated. Continue current meds

## 2024-02-17 NOTE — Assessment & Plan Note (Signed)
 Her BMI today is 46. Encouraged ongoing weight-loss efforts, as even modest reductions can significantly improve overall health. Encouraged DASH or MIND diet, decrease po intake and increase exercise as tolerated. Needs 7-8 hours of sleep nightly. Avoid trans fats, eat small, frequent meals every 4-5 hours with lean proteins, complex carbs and healthy fats. Minimize simple carbs, high fat foods and processed foods

## 2024-02-17 NOTE — Assessment & Plan Note (Signed)
 Supplement and monitor

## 2024-02-18 ENCOUNTER — Ambulatory Visit

## 2024-02-19 ENCOUNTER — Encounter: Payer: Self-pay | Admitting: Family Medicine

## 2024-02-21 ENCOUNTER — Telehealth: Admitting: Family Medicine

## 2024-02-21 DIAGNOSIS — G309 Alzheimer's disease, unspecified: Secondary | ICD-10-CM

## 2024-02-21 DIAGNOSIS — R7303 Prediabetes: Secondary | ICD-10-CM

## 2024-02-21 DIAGNOSIS — E782 Mixed hyperlipidemia: Secondary | ICD-10-CM

## 2024-02-21 DIAGNOSIS — F0280 Dementia in other diseases classified elsewhere without behavioral disturbance: Secondary | ICD-10-CM | POA: Diagnosis not present

## 2024-02-21 DIAGNOSIS — E559 Vitamin D deficiency, unspecified: Secondary | ICD-10-CM | POA: Diagnosis not present

## 2024-02-21 DIAGNOSIS — I10 Essential (primary) hypertension: Secondary | ICD-10-CM

## 2024-02-21 DIAGNOSIS — M810 Age-related osteoporosis without current pathological fracture: Secondary | ICD-10-CM

## 2024-02-21 DIAGNOSIS — E669 Obesity, unspecified: Secondary | ICD-10-CM | POA: Diagnosis not present

## 2024-02-21 NOTE — Patient Instructions (Addendum)
 Dr Dartha  Second Shingrix shot  Tetanus is due in 2027, or sooner if injured  Prevnar 20  RSV, Respiratory Syncitial Virus vaccine, Arexvy  Annual Covid shot  All [facility] are now walk in vaccine clinics M-F 9-4

## 2024-02-22 ENCOUNTER — Encounter: Payer: Self-pay | Admitting: Family Medicine

## 2024-02-29 ENCOUNTER — Other Ambulatory Visit: Payer: Self-pay | Admitting: Cardiology

## 2024-02-29 DIAGNOSIS — R002 Palpitations: Secondary | ICD-10-CM

## 2024-07-11 ENCOUNTER — Ambulatory Visit: Admitting: Physician Assistant
